# Patient Record
Sex: Female | Born: 1940
Health system: Southern US, Community
[De-identification: ages and names within clinical notes are randomized; demographics above are authoritative.]

## PROBLEM LIST (undated history)

## (undated) DIAGNOSIS — K599 Functional intestinal disorder, unspecified: Secondary | ICD-10-CM

## (undated) DIAGNOSIS — M1990 Unspecified osteoarthritis, unspecified site: Secondary | ICD-10-CM

## (undated) DIAGNOSIS — K589 Irritable bowel syndrome without diarrhea: Secondary | ICD-10-CM

## (undated) DIAGNOSIS — R413 Other amnesia: Secondary | ICD-10-CM

## (undated) DIAGNOSIS — K219 Gastro-esophageal reflux disease without esophagitis: Secondary | ICD-10-CM

## (undated) DIAGNOSIS — R32 Unspecified urinary incontinence: Secondary | ICD-10-CM

## (undated) DIAGNOSIS — M797 Fibromyalgia: Secondary | ICD-10-CM

## (undated) DIAGNOSIS — R142 Eructation: Secondary | ICD-10-CM

## (undated) DIAGNOSIS — C801 Malignant (primary) neoplasm, unspecified: Secondary | ICD-10-CM

## (undated) DIAGNOSIS — E785 Hyperlipidemia, unspecified: Secondary | ICD-10-CM

## (undated) DIAGNOSIS — F419 Anxiety disorder, unspecified: Secondary | ICD-10-CM

## (undated) DIAGNOSIS — R42 Dizziness and giddiness: Secondary | ICD-10-CM

## (undated) DIAGNOSIS — K5792 Diverticulitis of intestine, part unspecified, without perforation or abscess without bleeding: Secondary | ICD-10-CM

## (undated) DIAGNOSIS — K5900 Constipation, unspecified: Secondary | ICD-10-CM

## (undated) DIAGNOSIS — F32A Depression, unspecified: Secondary | ICD-10-CM

## (undated) DIAGNOSIS — F329 Major depressive disorder, single episode, unspecified: Secondary | ICD-10-CM

## (undated) DIAGNOSIS — K648 Other hemorrhoids: Secondary | ICD-10-CM

## (undated) DIAGNOSIS — J189 Pneumonia, unspecified organism: Secondary | ICD-10-CM

## (undated) DIAGNOSIS — N329 Bladder disorder, unspecified: Secondary | ICD-10-CM

## (undated) DIAGNOSIS — N179 Acute kidney failure, unspecified: Secondary | ICD-10-CM

## (undated) DIAGNOSIS — R011 Cardiac murmur, unspecified: Secondary | ICD-10-CM

## (undated) DIAGNOSIS — C3490 Malignant neoplasm of unspecified part of unspecified bronchus or lung: Secondary | ICD-10-CM

## (undated) HISTORY — DX: Unspecified osteoarthritis, unspecified site: M19.90

## (undated) HISTORY — DX: Pneumonia, unspecified organism: J18.9

## (undated) HISTORY — DX: Unspecified urinary incontinence: R32

## (undated) HISTORY — DX: Constipation, unspecified: K59.00

## (undated) HISTORY — DX: Hyperlipidemia, unspecified: E78.5

## (undated) HISTORY — DX: Acute kidney failure, unspecified: N17.9

## (undated) HISTORY — DX: Fibromyalgia: M79.7

## (undated) HISTORY — DX: Anxiety disorder, unspecified: F41.9

## (undated) HISTORY — DX: Cardiac murmur, unspecified: R01.1

## (undated) HISTORY — PX: CHOLECYSTECTOMY: SHX55

## (undated) HISTORY — DX: Gastro-esophageal reflux disease without esophagitis: K21.9

## (undated) HISTORY — DX: Irritable bowel syndrome, unspecified: K58.9

## (undated) HISTORY — DX: Major depressive disorder, single episode, unspecified: F32.9

## (undated) HISTORY — DX: Diverticulitis of intestine, part unspecified, without perforation or abscess without bleeding: K57.92

## (undated) HISTORY — DX: Eructation: R14.2

## (undated) HISTORY — DX: Depression, unspecified: F32.A

## (undated) HISTORY — DX: Dizziness and giddiness: R42

## (undated) HISTORY — PX: BLADDER SUSPENSION: SHX72

## (undated) HISTORY — DX: Other hemorrhoids: K64.8

---
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
## 2011-02-10 LAB — HM COLONOSCOPY

## 2011-06-12 DIAGNOSIS — R05 Cough: Secondary | ICD-10-CM | POA: Diagnosis not present

## 2011-06-12 DIAGNOSIS — H109 Unspecified conjunctivitis: Secondary | ICD-10-CM | POA: Diagnosis not present

## 2011-06-12 DIAGNOSIS — H10239 Serous conjunctivitis, except viral, unspecified eye: Secondary | ICD-10-CM | POA: Diagnosis not present

## 2011-06-17 DIAGNOSIS — J9801 Acute bronchospasm: Secondary | ICD-10-CM | POA: Diagnosis not present

## 2011-06-17 DIAGNOSIS — J019 Acute sinusitis, unspecified: Secondary | ICD-10-CM | POA: Diagnosis not present

## 2011-06-19 DIAGNOSIS — R5381 Other malaise: Secondary | ICD-10-CM | POA: Diagnosis not present

## 2011-06-19 DIAGNOSIS — R509 Fever, unspecified: Secondary | ICD-10-CM | POA: Diagnosis not present

## 2011-06-19 DIAGNOSIS — J1189 Influenza due to unidentified influenza virus with other manifestations: Secondary | ICD-10-CM | POA: Diagnosis not present

## 2011-06-19 DIAGNOSIS — R05 Cough: Secondary | ICD-10-CM | POA: Diagnosis not present

## 2011-06-20 DIAGNOSIS — R748 Abnormal levels of other serum enzymes: Secondary | ICD-10-CM | POA: Diagnosis not present

## 2011-06-20 DIAGNOSIS — K219 Gastro-esophageal reflux disease without esophagitis: Secondary | ICD-10-CM | POA: Diagnosis not present

## 2011-06-20 DIAGNOSIS — F0390 Unspecified dementia without behavioral disturbance: Secondary | ICD-10-CM | POA: Diagnosis not present

## 2011-06-20 DIAGNOSIS — E785 Hyperlipidemia, unspecified: Secondary | ICD-10-CM | POA: Diagnosis not present

## 2011-06-20 DIAGNOSIS — Z882 Allergy status to sulfonamides status: Secondary | ICD-10-CM | POA: Diagnosis not present

## 2011-06-20 DIAGNOSIS — E871 Hypo-osmolality and hyponatremia: Secondary | ICD-10-CM | POA: Diagnosis not present

## 2011-06-20 DIAGNOSIS — R5381 Other malaise: Secondary | ICD-10-CM | POA: Diagnosis not present

## 2011-06-20 DIAGNOSIS — R509 Fever, unspecified: Secondary | ICD-10-CM | POA: Diagnosis not present

## 2011-06-20 DIAGNOSIS — Z9089 Acquired absence of other organs: Secondary | ICD-10-CM | POA: Diagnosis not present

## 2011-06-20 DIAGNOSIS — R109 Unspecified abdominal pain: Secondary | ICD-10-CM | POA: Diagnosis not present

## 2011-06-20 DIAGNOSIS — R5383 Other fatigue: Secondary | ICD-10-CM | POA: Diagnosis not present

## 2011-06-20 DIAGNOSIS — R05 Cough: Secondary | ICD-10-CM | POA: Diagnosis not present

## 2011-06-20 DIAGNOSIS — E559 Vitamin D deficiency, unspecified: Secondary | ICD-10-CM | POA: Diagnosis not present

## 2011-06-20 DIAGNOSIS — B9789 Other viral agents as the cause of diseases classified elsewhere: Secondary | ICD-10-CM | POA: Diagnosis not present

## 2011-06-20 DIAGNOSIS — R7989 Other specified abnormal findings of blood chemistry: Secondary | ICD-10-CM | POA: Diagnosis not present

## 2011-06-20 DIAGNOSIS — J209 Acute bronchitis, unspecified: Secondary | ICD-10-CM | POA: Diagnosis not present

## 2011-06-20 DIAGNOSIS — R160 Hepatomegaly, not elsewhere classified: Secondary | ICD-10-CM | POA: Diagnosis not present

## 2011-06-20 DIAGNOSIS — F329 Major depressive disorder, single episode, unspecified: Secondary | ICD-10-CM | POA: Diagnosis not present

## 2011-06-27 DIAGNOSIS — R7989 Other specified abnormal findings of blood chemistry: Secondary | ICD-10-CM | POA: Diagnosis not present

## 2011-07-10 DIAGNOSIS — N319 Neuromuscular dysfunction of bladder, unspecified: Secondary | ICD-10-CM | POA: Diagnosis not present

## 2011-07-10 DIAGNOSIS — N139 Obstructive and reflux uropathy, unspecified: Secondary | ICD-10-CM | POA: Diagnosis not present

## 2011-07-10 DIAGNOSIS — N3941 Urge incontinence: Secondary | ICD-10-CM | POA: Diagnosis not present

## 2011-07-10 DIAGNOSIS — R350 Frequency of micturition: Secondary | ICD-10-CM | POA: Diagnosis not present

## 2011-07-10 DIAGNOSIS — R3915 Urgency of urination: Secondary | ICD-10-CM | POA: Diagnosis not present

## 2011-07-10 DIAGNOSIS — N3289 Other specified disorders of bladder: Secondary | ICD-10-CM | POA: Diagnosis not present

## 2011-07-15 DIAGNOSIS — J069 Acute upper respiratory infection, unspecified: Secondary | ICD-10-CM | POA: Diagnosis not present

## 2011-07-15 DIAGNOSIS — R945 Abnormal results of liver function studies: Secondary | ICD-10-CM | POA: Diagnosis not present

## 2011-07-15 DIAGNOSIS — E785 Hyperlipidemia, unspecified: Secondary | ICD-10-CM | POA: Diagnosis not present

## 2011-07-15 DIAGNOSIS — J111 Influenza due to unidentified influenza virus with other respiratory manifestations: Secondary | ICD-10-CM | POA: Diagnosis not present

## 2011-07-15 DIAGNOSIS — J9801 Acute bronchospasm: Secondary | ICD-10-CM | POA: Diagnosis not present

## 2011-07-18 DIAGNOSIS — R32 Unspecified urinary incontinence: Secondary | ICD-10-CM | POA: Diagnosis not present

## 2011-07-18 DIAGNOSIS — R350 Frequency of micturition: Secondary | ICD-10-CM | POA: Diagnosis not present

## 2011-07-18 DIAGNOSIS — IMO0002 Reserved for concepts with insufficient information to code with codable children: Secondary | ICD-10-CM | POA: Diagnosis not present

## 2011-07-18 DIAGNOSIS — N3946 Mixed incontinence: Secondary | ICD-10-CM | POA: Diagnosis not present

## 2011-07-23 DIAGNOSIS — N3946 Mixed incontinence: Secondary | ICD-10-CM | POA: Diagnosis not present

## 2011-07-23 DIAGNOSIS — R32 Unspecified urinary incontinence: Secondary | ICD-10-CM | POA: Diagnosis not present

## 2011-07-23 DIAGNOSIS — IMO0002 Reserved for concepts with insufficient information to code with codable children: Secondary | ICD-10-CM | POA: Diagnosis not present

## 2011-07-23 DIAGNOSIS — R350 Frequency of micturition: Secondary | ICD-10-CM | POA: Diagnosis not present

## 2011-08-07 DIAGNOSIS — K219 Gastro-esophageal reflux disease without esophagitis: Secondary | ICD-10-CM | POA: Diagnosis not present

## 2011-08-07 DIAGNOSIS — E785 Hyperlipidemia, unspecified: Secondary | ICD-10-CM | POA: Diagnosis not present

## 2011-08-07 DIAGNOSIS — Z0000 Encounter for general adult medical examination without abnormal findings: Secondary | ICD-10-CM | POA: Diagnosis not present

## 2011-08-07 DIAGNOSIS — N6019 Diffuse cystic mastopathy of unspecified breast: Secondary | ICD-10-CM | POA: Diagnosis not present

## 2011-08-08 DIAGNOSIS — N63 Unspecified lump in unspecified breast: Secondary | ICD-10-CM | POA: Diagnosis not present

## 2011-08-08 DIAGNOSIS — R928 Other abnormal and inconclusive findings on diagnostic imaging of breast: Secondary | ICD-10-CM | POA: Diagnosis not present

## 2011-08-08 DIAGNOSIS — Z1231 Encounter for screening mammogram for malignant neoplasm of breast: Secondary | ICD-10-CM | POA: Diagnosis not present

## 2011-08-08 LAB — HM MAMMOGRAPHY: HM Mammogram: NORMAL

## 2011-08-12 DIAGNOSIS — N6019 Diffuse cystic mastopathy of unspecified breast: Secondary | ICD-10-CM | POA: Diagnosis not present

## 2011-08-12 DIAGNOSIS — N63 Unspecified lump in unspecified breast: Secondary | ICD-10-CM | POA: Diagnosis not present

## 2011-08-13 DIAGNOSIS — IMO0002 Reserved for concepts with insufficient information to code with codable children: Secondary | ICD-10-CM | POA: Diagnosis not present

## 2011-08-13 DIAGNOSIS — N3946 Mixed incontinence: Secondary | ICD-10-CM | POA: Diagnosis not present

## 2011-08-13 DIAGNOSIS — R350 Frequency of micturition: Secondary | ICD-10-CM | POA: Diagnosis not present

## 2011-08-13 DIAGNOSIS — R32 Unspecified urinary incontinence: Secondary | ICD-10-CM | POA: Diagnosis not present

## 2011-08-22 DIAGNOSIS — R350 Frequency of micturition: Secondary | ICD-10-CM | POA: Diagnosis not present

## 2011-08-22 DIAGNOSIS — R32 Unspecified urinary incontinence: Secondary | ICD-10-CM | POA: Diagnosis not present

## 2011-08-22 DIAGNOSIS — N3946 Mixed incontinence: Secondary | ICD-10-CM | POA: Diagnosis not present

## 2011-08-22 DIAGNOSIS — IMO0002 Reserved for concepts with insufficient information to code with codable children: Secondary | ICD-10-CM | POA: Diagnosis not present

## 2011-08-28 DIAGNOSIS — R350 Frequency of micturition: Secondary | ICD-10-CM | POA: Diagnosis not present

## 2011-08-28 DIAGNOSIS — IMO0002 Reserved for concepts with insufficient information to code with codable children: Secondary | ICD-10-CM | POA: Diagnosis not present

## 2011-08-28 DIAGNOSIS — R32 Unspecified urinary incontinence: Secondary | ICD-10-CM | POA: Diagnosis not present

## 2011-08-28 DIAGNOSIS — N3946 Mixed incontinence: Secondary | ICD-10-CM | POA: Diagnosis not present

## 2011-09-10 DIAGNOSIS — N318 Other neuromuscular dysfunction of bladder: Secondary | ICD-10-CM | POA: Diagnosis not present

## 2011-09-10 DIAGNOSIS — F329 Major depressive disorder, single episode, unspecified: Secondary | ICD-10-CM | POA: Diagnosis not present

## 2011-09-10 DIAGNOSIS — R350 Frequency of micturition: Secondary | ICD-10-CM | POA: Diagnosis not present

## 2011-09-16 DIAGNOSIS — R9389 Abnormal findings on diagnostic imaging of other specified body structures: Secondary | ICD-10-CM | POA: Diagnosis not present

## 2011-09-16 DIAGNOSIS — J984 Other disorders of lung: Secondary | ICD-10-CM | POA: Diagnosis not present

## 2011-09-19 DIAGNOSIS — R918 Other nonspecific abnormal finding of lung field: Secondary | ICD-10-CM | POA: Diagnosis not present

## 2011-09-23 DIAGNOSIS — F329 Major depressive disorder, single episode, unspecified: Secondary | ICD-10-CM | POA: Diagnosis not present

## 2011-09-23 DIAGNOSIS — IMO0001 Reserved for inherently not codable concepts without codable children: Secondary | ICD-10-CM | POA: Diagnosis not present

## 2011-09-23 DIAGNOSIS — E785 Hyperlipidemia, unspecified: Secondary | ICD-10-CM | POA: Diagnosis not present

## 2011-09-23 DIAGNOSIS — M1990 Unspecified osteoarthritis, unspecified site: Secondary | ICD-10-CM | POA: Diagnosis not present

## 2011-10-06 DIAGNOSIS — M1710 Unilateral primary osteoarthritis, unspecified knee: Secondary | ICD-10-CM | POA: Diagnosis not present

## 2012-02-03 DIAGNOSIS — H2510 Age-related nuclear cataract, unspecified eye: Secondary | ICD-10-CM | POA: Diagnosis not present

## 2012-02-05 ENCOUNTER — Ambulatory Visit (INDEPENDENT_AMBULATORY_CARE_PROVIDER_SITE_OTHER): Payer: Medicare Other | Admitting: Family Medicine

## 2012-02-05 ENCOUNTER — Encounter: Payer: Self-pay | Admitting: Family Medicine

## 2012-02-05 VITALS — BP 110/78 | HR 72 | Temp 97.8°F | Ht 63.25 in | Wt 178.0 lb

## 2012-02-05 DIAGNOSIS — Z23 Encounter for immunization: Secondary | ICD-10-CM | POA: Diagnosis not present

## 2012-02-05 DIAGNOSIS — R32 Unspecified urinary incontinence: Secondary | ICD-10-CM | POA: Diagnosis not present

## 2012-02-05 DIAGNOSIS — R413 Other amnesia: Secondary | ICD-10-CM

## 2012-02-05 DIAGNOSIS — K5732 Diverticulitis of large intestine without perforation or abscess without bleeding: Secondary | ICD-10-CM

## 2012-02-05 DIAGNOSIS — K5792 Diverticulitis of intestine, part unspecified, without perforation or abscess without bleeding: Secondary | ICD-10-CM

## 2012-02-05 DIAGNOSIS — F331 Major depressive disorder, recurrent, moderate: Secondary | ICD-10-CM | POA: Insufficient documentation

## 2012-02-05 DIAGNOSIS — K219 Gastro-esophageal reflux disease without esophagitis: Secondary | ICD-10-CM

## 2012-02-05 DIAGNOSIS — F329 Major depressive disorder, single episode, unspecified: Secondary | ICD-10-CM

## 2012-02-05 DIAGNOSIS — E785 Hyperlipidemia, unspecified: Secondary | ICD-10-CM | POA: Insufficient documentation

## 2012-02-05 DIAGNOSIS — F32A Depression, unspecified: Secondary | ICD-10-CM

## 2012-02-05 LAB — LIPID PANEL: Cholesterol: 237 mg/dL — ABNORMAL HIGH (ref 0–200)

## 2012-02-05 MED ORDER — MIRABEGRON ER 25 MG PO TB24: 25.0000 mg | ORAL_TABLET | Freq: Every day | ORAL | Status: DC

## 2012-02-05 MED ORDER — CICLOPIROX 1 % EX SHAM: MEDICATED_SHAMPOO | CUTANEOUS | Status: DC

## 2012-02-05 MED ORDER — MEMANTINE HCL 10 MG PO TABS: 10.0000 mg | ORAL_TABLET | Freq: Two times a day (BID) | ORAL | Status: DC

## 2012-02-05 NOTE — Progress Notes (Signed)
Subjective:    Patient ID: Jamie Matthews, female    DOB: 1940-06-11, 71 y.o.   MRN: [id number]  HPI Very pleasant 71 yo female here to establish care.  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 Has been on vesicare since last fall.  Has not noticed much improvement and lately since she moved here, it has gotten worse.  She is working on her Kegel exercises. Does have constipation associated with vesicare use.  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  Per pt, could not tolerate other medications.  Denies dementia.  Will await records.  HLD- stopped taking lipitor which she had been on for years, approx a month ago. She felt the generic was causing itching which has improved since she stopped taking it.  Very active- tries to swim or go to the [facility] daily. Active in the singing group at [facility].  Patient Active Problem List  Diagnosis  . Urinary incontinence  . Depression  . GERD (gastroesophageal reflux disease)  . Diverticulitis  . Hyperlipidemia  . Memory loss   Past Medical History  Diagnosis Date  . Urinary incontinence   . Depression   . GERD (gastroesophageal reflux disease)   . Diverticulitis   . Hyperlipidemia    Past Surgical History  Procedure Date  . Cholecystectomy   . Tonsillectomy   . Bladder suspension 2004, 2012   History  Substance Use Topics  . Smoking status: Never Smoker   . Smokeless tobacco: Not on file  . Alcohol Use: Not on file   Family History  Problem Relation Age of Onset  . Cancer Father   . Diabetes Father    Allergies  Allergen Reactions  . Lipitor (Atorvastatin)     Itching   . Sulfa Antibiotics Rash   Current Outpatient Prescriptions on File Prior to Visit  Medication Sig Dispense Refill  . atorvastatin (LIPITOR) 20 MG tablet Take 20 mg by mouth daily.      . diphenhydrAMINE (BENADRYL) 25 MG tablet Take by mouth as directed      . lansoprazole (PREVACID) 30 MG capsule Take 30 mg by mouth daily.      .  memantine (NAMENDA) 10 MG tablet Take 1 tablet (10 mg total) by mouth 2 (two) times daily.  60 tablet  6  . venlafaxine XR (EFFEXOR-XR) 150 MG 24 hr capsule Take 150 mg by mouth daily.      . mirabegron ER (MYRBETRIQ) 25 MG TB24 Take 1 tablet (25 mg total) by mouth daily.  30 tablet  11   The PMH, PSH, Social History, Family History, Medications, and allergies have been reviewed in [facility], and have been updated if relevant.   Review of Systems See HPI Patient reports no  vision/ hearing changes,anorexia, weight change, fever ,adenopathy, persistant / recurrent hoarseness, swallowing issues, chest pain, edema,persistant / recurrent cough, hemoptysis, major joint swelling, breast masses or abnormal vaginal bleeding.       Objective:   Physical Exam BP 110/78  Pulse 72  Temp 97.8 F (36.6 C)  Ht 5' 3.25" (1.607 m)  Wt 178 lb (80.74 kg)  BMI 31.28 kg/m2  General:  Well-developed,well-nourished,in no acute distress; alert,appropriate and cooperative throughout examination Head:  normocephalic and atraumatic.   Eyes:  vision grossly intact, pupils equal, pupils round, and pupils reactive to light.   Ears:  R ear normal and L ear normal.   Nose:  no external deformity.   Lungs:  Normal respiratory effort, chest expands symmetrically. Lungs  are clear to auscultation, no crackles or wheezes. Heart:  Normal rate and regular rhythm. S1 and S2 normal without gallop, murmur, click, rub or other extra sounds. Abdomen:  Bowel sounds positive,abdomen soft and non-tender without masses, organomegaly or hernias noted. Msk:  No deformity or scoliosis noted of thoracic or lumbar spine.   Extremities:  No clubbing, cyanosis, edema, or deformity noted with normal full range of motion of all joints.   Neurologic:  alert & oriented X3 and gait normal.   Skin:  Intact without suspicious lesions or rashes Psych:  Cognition and judgment appear intact. Alert and cooperative with normal attention span and  concentration. No apparent delusions, illusions, hallucinations        Assessment & Plan:   1. Urinary incontinence  Deteriorated. Will d/c vesicare, refer to urology given h/o bladder sling. Start Mirabegron (no anticholingeric side effects). The patient indicates understanding of these issues and agrees with the plan.   Ambulatory referral to Urology  2. Depression  Stable on Effexor.   3. GERD (gastroesophageal reflux disease Quiet    4. Diverticulitis  Quiet, recent colonoscopy   5. Hyperlipidemia  Check lipid panel today. Lipitor added to allergy list. May not need to restart a statin based on cholesterol. Lipid Panel, Comprehensive metabolic panel  6. Memory loss  Unclear why she is on Namenda as she does not have significant dementia. She was intolerant to Exelon patch. Awaiting records.   7. Need for prophylactic vaccination and inoculation against influenza  Flu vaccine greater than or equal to 3yo preservative free IM

## 2012-02-05 NOTE — Patient Instructions (Addendum)
Please stop your vesicare. We are starting Myrbetriq. We are checking your cholesterol today and will call you with those results.  Please stop by to see Shirlee Limerick on your way out to set up your urology appointment.

## 2012-02-06 LAB — COMPREHENSIVE METABOLIC PANEL
Albumin: 4 g/dL (ref 3.5–5.2)
BUN: 24 mg/dL — ABNORMAL HIGH (ref 6–23)
CO2: 28 mEq/L (ref 19–32)
Calcium: 8.9 mg/dL (ref 8.4–10.5)
Chloride: 104 mEq/L (ref 96–112)
Creatinine, Ser: 1 mg/dL (ref 0.4–1.2)
GFR: 55.76 mL/min — ABNORMAL LOW (ref >60.00)
Glucose, Bld: 83 mg/dL (ref 70–99)
Potassium: 4.5 mEq/L (ref 3.5–5.1)

## 2012-02-06 LAB — LDL CHOLESTEROL, DIRECT: Direct LDL: 175.9 mg/dL

## 2012-02-06 MED ORDER — ATORVASTATIN CALCIUM 20 MG PO TABS: 20.0000 mg | ORAL_TABLET | Freq: Every day | ORAL | Status: DC

## 2012-02-06 NOTE — Addendum Note (Signed)
Addended by: Eliezer Bottom on: 02/06/2012 04:42 PM   Modules accepted: Orders

## 2012-02-09 ENCOUNTER — Telehealth: Payer: Self-pay | Admitting: Family Medicine

## 2012-02-09 NOTE — Telephone Encounter (Signed)
Yes see previous phone note. I advised sending Pravachol 20 mg daily #30 with 1 refill to her pharmacy.  Mirabegron is for urinary incontinence.

## 2012-02-09 NOTE — Telephone Encounter (Signed)
Please call Angelica Chessman to ask her if this is ok to give order for UA.

## 2012-02-09 NOTE — Telephone Encounter (Signed)
Pt called for lab results; see result note; pt said she is allergic to Lipitor; caused itching, no rash or SOB. Should pt restart Lipitor? Pt will not take Lipitor until pt gets call back. Pt also has frequency and urgency of urine; pt wondered if could have urinalysis done at [facility]?(pt resident at [facility]) Pt also wanted to verify why taking Mirabegron;explained for urinary incontinence. Pt wanted that verified by Dr Dayton Martes. [facility], [facility] pharmacy.

## 2012-02-09 NOTE — Telephone Encounter (Signed)
Advised patient, she has not picked up the pravastatin yet, but will.  She is also asking that an order for UA be sent to [facility], since she is having the frequency and urgency.

## 2012-02-09 NOTE — Telephone Encounter (Signed)
Jamie Matthews says it's ok to fax order for UA to fax number [phone].  Order can be faxed on Tuesday morning.

## 2012-02-10 NOTE — Telephone Encounter (Signed)
Order in my box

## 2012-02-10 NOTE — Telephone Encounter (Signed)
Order faxed.

## 2012-02-12 ENCOUNTER — Telehealth: Payer: Self-pay | Admitting: *Deleted

## 2012-02-12 DIAGNOSIS — L820 Inflamed seborrheic keratosis: Secondary | ICD-10-CM | POA: Diagnosis not present

## 2012-02-12 DIAGNOSIS — L219 Seborrheic dermatitis, unspecified: Secondary | ICD-10-CM | POA: Diagnosis not present

## 2012-02-12 DIAGNOSIS — L299 Pruritus, unspecified: Secondary | ICD-10-CM | POA: Diagnosis not present

## 2012-02-12 DIAGNOSIS — D1800 Hemangioma unspecified site: Secondary | ICD-10-CM | POA: Diagnosis not present

## 2012-02-12 DIAGNOSIS — L738 Other specified follicular disorders: Secondary | ICD-10-CM | POA: Diagnosis not present

## 2012-02-12 NOTE — Telephone Encounter (Signed)
Noted! Thank you

## 2012-02-12 NOTE — Telephone Encounter (Signed)
Spoke with patient, she is going to have urine checked at [facility] tomorrow.  Also, wants you to know that she saw dermatologist today and was given an order to have her thyroid checked at [facility] tomorrow.

## 2012-02-13 DIAGNOSIS — R300 Dysuria: Secondary | ICD-10-CM | POA: Diagnosis not present

## 2012-02-17 ENCOUNTER — Encounter: Payer: Self-pay | Admitting: Family Medicine

## 2012-02-19 ENCOUNTER — Encounter: Payer: Self-pay | Admitting: Family Medicine

## 2012-03-02 DIAGNOSIS — R32 Unspecified urinary incontinence: Secondary | ICD-10-CM | POA: Diagnosis not present

## 2012-03-02 DIAGNOSIS — R339 Retention of urine, unspecified: Secondary | ICD-10-CM | POA: Diagnosis not present

## 2012-03-21 DIAGNOSIS — M545 Low back pain: Secondary | ICD-10-CM | POA: Diagnosis not present

## 2012-03-21 DIAGNOSIS — M47817 Spondylosis without myelopathy or radiculopathy, lumbosacral region: Secondary | ICD-10-CM | POA: Diagnosis not present

## 2012-03-21 DIAGNOSIS — M5137 Other intervertebral disc degeneration, lumbosacral region: Secondary | ICD-10-CM | POA: Diagnosis not present

## 2012-03-23 DIAGNOSIS — R32 Unspecified urinary incontinence: Secondary | ICD-10-CM | POA: Diagnosis not present

## 2012-03-23 DIAGNOSIS — R339 Retention of urine, unspecified: Secondary | ICD-10-CM | POA: Diagnosis not present

## 2012-03-30 DIAGNOSIS — R339 Retention of urine, unspecified: Secondary | ICD-10-CM | POA: Diagnosis not present

## 2012-03-30 DIAGNOSIS — N318 Other neuromuscular dysfunction of bladder: Secondary | ICD-10-CM | POA: Diagnosis not present

## 2012-03-30 DIAGNOSIS — N3941 Urge incontinence: Secondary | ICD-10-CM | POA: Diagnosis not present

## 2012-04-01 ENCOUNTER — Ambulatory Visit (INDEPENDENT_AMBULATORY_CARE_PROVIDER_SITE_OTHER): Payer: Medicare Other | Admitting: Family Medicine

## 2012-04-01 ENCOUNTER — Encounter: Payer: Self-pay | Admitting: Family Medicine

## 2012-04-01 VITALS — BP 120/62 | HR 64 | Temp 97.4°F | Wt 174.0 lb

## 2012-04-01 DIAGNOSIS — K219 Gastro-esophageal reflux disease without esophagitis: Secondary | ICD-10-CM | POA: Diagnosis not present

## 2012-04-01 DIAGNOSIS — K5900 Constipation, unspecified: Secondary | ICD-10-CM

## 2012-04-01 DIAGNOSIS — K5909 Other constipation: Secondary | ICD-10-CM | POA: Insufficient documentation

## 2012-04-01 MED ORDER — LUBIPROSTONE 8 MCG PO CAPS: 8.0000 ug | ORAL_CAPSULE | Freq: Two times a day (BID) | ORAL | Status: DC

## 2012-04-01 NOTE — Patient Instructions (Addendum)
Let's start Align 4 mg daily- this is a probiotic.  We are also adding amitiza.  Please call me next week with an update.    Gastroesophageal Reflux Disease, Adult Gastroesophageal reflux disease (GERD) happens when acid from your stomach flows up into the esophagus. When acid comes in contact with the esophagus, the acid causes soreness (inflammation) in the esophagus. Over time, GERD may create small holes (ulcers) in the lining of the esophagus. CAUSES   Increased body weight. This puts pressure on the stomach, making acid rise from the stomach into the esophagus.  Smoking. This increases acid production in the stomach.  Drinking alcohol. This causes decreased pressure in the lower esophageal sphincter (valve or ring of muscle between the esophagus and stomach), allowing acid from the stomach into the esophagus.  Late evening meals and a full stomach. This increases pressure and acid production in the stomach.  A malformed lower esophageal sphincter. Sometimes, no cause is found. SYMPTOMS   Burning pain in the lower part of the mid-chest behind the breastbone and in the mid-stomach area. This may occur twice a week or more often.  Trouble swallowing.  Sore throat.  Dry cough.  Asthma-like symptoms including chest tightness, shortness of breath, or wheezing. DIAGNOSIS  Your caregiver may be able to diagnose GERD based on your symptoms. In some cases, X-rays and other tests may be done to check for complications or to check the condition of your stomach and esophagus. TREATMENT  Your caregiver may recommend over-the-counter or prescription medicines to help decrease acid production. Ask your caregiver before starting or adding any new medicines.  HOME CARE INSTRUCTIONS   Change the factors that you can control. Ask your caregiver for guidance concerning weight loss, quitting smoking, and alcohol consumption.  Avoid foods and drinks that make your symptoms worse, such  as:  Caffeine or alcoholic drinks.  Chocolate.  Peppermint or mint flavorings.  Garlic and onions.  Spicy foods.  Citrus fruits, such as oranges, lemons, or limes.  Tomato-based foods such as sauce, chili, salsa, and pizza.  Fried and fatty foods.  Avoid lying down for the 3 hours prior to your bedtime or prior to taking a nap.  Eat small, frequent meals instead of large meals.  Wear loose-fitting clothing. Do not wear anything tight around your waist that causes pressure on your stomach.  Raise the head of your bed 6 to 8 inches with wood blocks to help you sleep. Extra pillows will not help.  Only take over-the-counter or prescription medicines for pain, discomfort, or fever as directed by your caregiver.  Do not take aspirin, ibuprofen, or other nonsteroidal anti-inflammatory drugs (NSAIDs). SEEK IMMEDIATE MEDICAL CARE IF:   You have pain in your arms, neck, jaw, teeth, or back.  Your pain increases or changes in intensity or duration.  You develop nausea, vomiting, or sweating (diaphoresis).  You develop shortness of breath, or you faint.  Your vomit is green, yellow, black, or looks like coffee grounds or blood.  Your stool is red, bloody, or black. These symptoms could be signs of other problems, such as heart disease, gastric bleeding, or esophageal bleeding. MAKE SURE YOU:   Understand these instructions.  Will watch your condition.  Will get help right away if you are not doing well or get worse. Document Released: 02/19/2005 Document Revised: 08/04/2011 Document Reviewed: 11/29/2010 [facility] Patient Information 2013 [place], [place].

## 2012-04-01 NOTE — Progress Notes (Signed)
Subjective:    Patient ID: Jamie Matthews, female    DOB: 1941-03-04, 71 y.o.   MRN: [id number]  HPI Very pleasant 71 yo female with h/o GERD and chronic constipation here for:  1.  Worsening constipation- using miralax daily but still having constipation.  Colonoscopy last year neg (except for diverticula). She is also very gasy.  Not taking any stool softeners.  No abdominal pain.  Sometimes nauseated, no vomiting.  On Myrbetriq for UI, previously on Vesicare which we d/c'd due to constipation.   2.  GERD- worsening symptoms of acid in her chest and throat at night.  Started taking Tagamet in the evening and those symptoms have improved.     Patient Active Problem List  Diagnosis  . Urinary incontinence  . Depression  . GERD (gastroesophageal reflux disease)  . Diverticulitis  . Hyperlipidemia  . Memory loss  . Constipation   Past Medical History  Diagnosis Date  . Urinary incontinence   . Depression   . GERD (gastroesophageal reflux disease)   . Diverticulitis   . Hyperlipidemia    Past Surgical History  Procedure Date  . Cholecystectomy   . Tonsillectomy   . Bladder suspension 2004, 2012   History  Substance Use Topics  . Smoking status: Never Smoker   . Smokeless tobacco: Not on file  . Alcohol Use: Not on file   Family History  Problem Relation Age of Onset  . Cancer Father   . Diabetes Father    Allergies  Allergen Reactions  . Lipitor (Atorvastatin)     Itching   . Sulfa Antibiotics Rash   Current Outpatient Prescriptions on File Prior to Visit  Medication Sig Dispense Refill  . atorvastatin (LIPITOR) 20 MG tablet Take 1 tablet (20 mg total) by mouth daily.  30 tablet  5  . Ciclopirox 1 % shampoo Use as directed.  120 mL  0  . diphenhydrAMINE (BENADRYL) 25 MG tablet Take by mouth as directed      . lansoprazole (PREVACID) 30 MG capsule Take 30 mg by mouth daily.      . memantine (NAMENDA) 10 MG tablet Take 1 tablet (10 mg total) by mouth 2  (two) times daily.  60 tablet  6  . mirabegron ER (MYRBETRIQ) 25 MG TB24 Take 1 tablet (25 mg total) by mouth daily.  30 tablet  11  . venlafaxine XR (EFFEXOR-XR) 150 MG 24 hr capsule Take 150 mg by mouth daily.       The PMH, PSH, Social History, Family History, Medications, and allergies have been reviewed in [facility], and have been updated if relevant.   Review of Systems See HPI    Appetite good- she is actively trying to lose weight. Objective:   Physical Exam BP 120/62  Pulse 64  Temp 97.4 F (36.3 C)  Wt 174 lb (78.926 kg)  General:  Well-developed,well-nourished,in no acute distress; alert,appropriate and cooperative throughout examination Head:  normocephalic and atraumatic.   Lungs:  Normal respiratory effort, chest expands symmetrically. Lungs are clear to auscultation, no crackles or wheezes. Heart:  Normal rate and regular rhythm. S1 and S2 normal without gallop, murmur, click, rub or other extra sounds. Abdomen:  Bowel sounds positive,abdomen soft and non-tender without masses, organomegaly or hernias noted. Msk:  No deformity or scoliosis noted of thoracic or lumbar spine.   Extremities:  No clubbing, cyanosis, edema, or deformity noted with normal full range of motion of all joints.   Neurologic:  alert & oriented  X3 and gait normal.   Skin:  Intact without suspicious lesions or rashes Psych:  Cognition and judgment appear intact. Alert and cooperative with normal attention span and concentration. No apparent delusions, illusions, hallucinations     Assessment & Plan:    1. GERD (gastroesophageal reflux disease)  Deteriorated.  Discussed supportive care- see handout.   Continue Tagamet in the evening.  2. Constipation  ? IBS as contributing factor- given samples of amitiza.  Add Align as well. Pt to call me in a few weeks with an update.

## 2012-04-05 ENCOUNTER — Other Ambulatory Visit: Payer: Self-pay | Admitting: *Deleted

## 2012-04-05 MED ORDER — LANSOPRAZOLE 30 MG PO CPDR: 30.0000 mg | DELAYED_RELEASE_CAPSULE | Freq: Every day | ORAL | Status: DC

## 2012-04-08 ENCOUNTER — Other Ambulatory Visit: Payer: Self-pay | Admitting: *Deleted

## 2012-04-08 MED ORDER — ALBUTEROL SULFATE HFA 108 (90 BASE) MCG/ACT IN AERS: 2.0000 | INHALATION_SPRAY | Freq: Four times a day (QID) | RESPIRATORY_TRACT | Status: DC | PRN

## 2012-04-08 NOTE — Telephone Encounter (Signed)
Pt is asking for script for albuterol inhaler.  Says her previous doctor "at home" prescribed this for her and she only uses occasionally.  This isn't on med list.

## 2012-04-08 NOTE — Telephone Encounter (Signed)
Rx sent to her pharmacy 

## 2012-04-09 ENCOUNTER — Telehealth: Payer: Self-pay

## 2012-04-09 NOTE — Telephone Encounter (Signed)
Pt started Amitiza and doing OK, no problem with constipation now. Pt thought Align was to be called in, explained is OTC med and pt will get Align as well.[facility] if needed.

## 2012-04-09 NOTE — Telephone Encounter (Signed)
Noted! Thank you

## 2012-04-21 ENCOUNTER — Other Ambulatory Visit: Payer: Self-pay

## 2012-04-21 MED ORDER — VENLAFAXINE HCL ER 150 MG PO CP24: 150.0000 mg | ORAL_CAPSULE | Freq: Every day | ORAL | Status: DC

## 2012-04-21 NOTE — Telephone Encounter (Signed)
[facility] faxed refill for venlafaxine XR # 90 x 0.

## 2012-04-26 DIAGNOSIS — M2040 Other hammer toe(s) (acquired), unspecified foot: Secondary | ICD-10-CM | POA: Diagnosis not present

## 2012-04-26 DIAGNOSIS — Q6689 Other  specified congenital deformities of feet: Secondary | ICD-10-CM | POA: Diagnosis not present

## 2012-04-26 DIAGNOSIS — M659 Synovitis and tenosynovitis, unspecified: Secondary | ICD-10-CM | POA: Diagnosis not present

## 2012-04-26 DIAGNOSIS — M2010 Hallux valgus (acquired), unspecified foot: Secondary | ICD-10-CM | POA: Diagnosis not present

## 2012-04-30 ENCOUNTER — Other Ambulatory Visit: Payer: Self-pay | Admitting: Family Medicine

## 2012-07-21 ENCOUNTER — Other Ambulatory Visit: Payer: Self-pay | Admitting: Family Medicine

## 2012-08-03 ENCOUNTER — Encounter: Payer: Self-pay | Admitting: Family Medicine

## 2012-08-03 ENCOUNTER — Ambulatory Visit (INDEPENDENT_AMBULATORY_CARE_PROVIDER_SITE_OTHER): Payer: Medicare Other | Admitting: Family Medicine

## 2012-08-03 VITALS — BP 130/70 | HR 72 | Temp 98.2°F | Wt 177.0 lb

## 2012-08-03 DIAGNOSIS — B9789 Other viral agents as the cause of diseases classified elsewhere: Secondary | ICD-10-CM | POA: Diagnosis not present

## 2012-08-03 NOTE — Patient Instructions (Addendum)
Good to see you. This is likely a bad virus. Drink lots of fluids.   Treat sympotmatically with Mucinex, nasal saline irrigation, and Tylenol/Ibuprofen.  Also try an antihistamine/decongestant like claritin D or zyrtec D over the counter- two times a day as needed ( have to sign for them at pharmacy).   Try over the counter Nasocort-start with 2 sprays per nostril per day...and then try to taper to 1 spray per nostril once symptoms improve.   You can use warm compresses.    Call if not improving as expected in 5-7 days.

## 2012-08-03 NOTE — Progress Notes (Signed)
SUBJECTIVE:  Jamie Matthews is a 72 y.o. female who complains of coryza, congestion, sneezing and productive cough for 3 days. She denies a history of anorexia, chest pain, chills, dizziness and fevers and denies a history of asthma. Patient denies smoke cigarettes.   Patient Active Problem List  Diagnosis  . Urinary incontinence  . Depression  . GERD (gastroesophageal reflux disease)  . Diverticulitis  . Hyperlipidemia  . Memory loss  . Constipation   Past Medical History  Diagnosis Date  . Urinary incontinence   . Depression   . GERD (gastroesophageal reflux disease)   . Diverticulitis   . Hyperlipidemia    Past Surgical History  Procedure Laterality Date  . Cholecystectomy    . Tonsillectomy    . Bladder suspension  2004, 2012   History  Substance Use Topics  . Smoking status: Never Smoker   . Smokeless tobacco: Not on file  . Alcohol Use: Not on file   Family History  Problem Relation Age of Onset  . Cancer Father   . Diabetes Father    Allergies  Allergen Reactions  . Lipitor (Atorvastatin)     Itching   . Sulfa Antibiotics Rash   Current Outpatient Prescriptions on File Prior to Visit  Medication Sig Dispense Refill  . albuterol (PROVENTIL HFA;VENTOLIN HFA) 108 (90 BASE) MCG/ACT inhaler Inhale 2 puffs into the lungs every 6 (six) hours as needed for wheezing.  1 Inhaler  0  . atorvastatin (LIPITOR) 20 MG tablet Take 1 tablet (20 mg total) by mouth daily.  30 tablet  5  . Ciclopirox 1 % shampoo USE AS DIRECTED  120 mL  0  . diphenhydrAMINE (BENADRYL) 25 MG tablet Take by mouth as directed      . lansoprazole (PREVACID) 30 MG capsule Take 1 capsule (30 mg total) by mouth daily.  90 capsule  1  . lubiprostone (AMITIZA) 8 MCG capsule Take 1 capsule (8 mcg total) by mouth 2 (two) times daily with a meal.  60 capsule  0  . memantine (NAMENDA) 10 MG tablet Take 1 tablet (10 mg total) by mouth 2 (two) times daily.  60 tablet  6  . mirabegron ER (MYRBETRIQ) 25  MG TB24 Take 1 tablet (25 mg total) by mouth daily.  30 tablet  11  . venlafaxine XR (EFFEXOR-XR) 150 MG 24 hr capsule TAKE ONE CAPSULE DAILY  30 capsule  2   No current facility-administered medications on file prior to visit.   The PMH, PSH, Social History, Family History, Medications, and allergies have been reviewed in [facility], and have been updated if relevant.  OBJECTIVE: BP 130/70  Pulse 72  Temp(Src) 98.2 F (36.8 C)  Wt 177 lb (80.287 kg)  BMI 31.09 kg/m2  She appears well, vital signs are as noted. Ears normal.  Throat and pharynx normal.  Neck supple. No adenopathy in the neck. Nose is congested. Sinuses non tender. The chest is clear, without wheezes or rales.  ASSESSMENT:  viral upper respiratory illness  PLAN: Symptomatic therapy suggested: push fluids, rest and return office visit prn if symptoms persist or worsen. Lack of antibiotic effectiveness discussed with her. Call or return to clinic prn if these symptoms worsen or fail to improve as anticipated.

## 2012-09-16 ENCOUNTER — Other Ambulatory Visit: Payer: Self-pay | Admitting: Family Medicine

## 2012-09-16 MED ORDER — ATORVASTATIN CALCIUM 20 MG PO TABS: 20.0000 mg | ORAL_TABLET | Freq: Every day | ORAL | Status: DC

## 2012-09-16 NOTE — Telephone Encounter (Signed)
Received fax from pharmacy saying pt would like Rx changed to a 90 day supply. Done

## 2012-09-27 ENCOUNTER — Other Ambulatory Visit: Payer: Self-pay | Admitting: Family Medicine

## 2012-10-02 ENCOUNTER — Emergency Department: Payer: Self-pay | Admitting: Emergency Medicine

## 2012-10-02 DIAGNOSIS — S61209A Unspecified open wound of unspecified finger without damage to nail, initial encounter: Secondary | ICD-10-CM | POA: Diagnosis not present

## 2012-10-02 DIAGNOSIS — IMO0001 Reserved for inherently not codable concepts without codable children: Secondary | ICD-10-CM | POA: Diagnosis not present

## 2012-10-02 DIAGNOSIS — Z882 Allergy status to sulfonamides status: Secondary | ICD-10-CM | POA: Diagnosis not present

## 2012-10-14 ENCOUNTER — Encounter: Payer: Self-pay | Admitting: Family Medicine

## 2012-10-14 ENCOUNTER — Ambulatory Visit: Payer: Medicare Other | Admitting: Family Medicine

## 2012-10-14 ENCOUNTER — Ambulatory Visit (INDEPENDENT_AMBULATORY_CARE_PROVIDER_SITE_OTHER): Payer: Medicare Other | Admitting: Family Medicine

## 2012-10-14 VITALS — BP 100/60 | HR 64 | Temp 98.1°F | Wt 168.0 lb

## 2012-10-14 DIAGNOSIS — K5900 Constipation, unspecified: Secondary | ICD-10-CM

## 2012-10-14 DIAGNOSIS — K219 Gastro-esophageal reflux disease without esophagitis: Secondary | ICD-10-CM | POA: Diagnosis not present

## 2012-10-14 DIAGNOSIS — K5792 Diverticulitis of intestine, part unspecified, without perforation or abscess without bleeding: Secondary | ICD-10-CM

## 2012-10-14 DIAGNOSIS — K5732 Diverticulitis of large intestine without perforation or abscess without bleeding: Secondary | ICD-10-CM

## 2012-10-14 LAB — CBC WITH DIFFERENTIAL/PLATELET
Basophils Absolute: 0 10*3/uL (ref 0.0–0.1)
Lymphocytes Relative: 18.2 % (ref 12.0–46.0)
Lymphs Abs: 1.2 10*3/uL (ref 0.7–4.0)
Monocytes Relative: 12.6 % — ABNORMAL HIGH (ref 3.0–12.0)
Neutrophils Relative %: 66.2 % (ref 43.0–77.0)
Platelets: 232 10*3/uL (ref 150.0–400.0)
RDW: 13.9 % (ref 11.5–14.6)
WBC: 6.9 10*3/uL (ref 4.5–10.5)

## 2012-10-14 NOTE — Patient Instructions (Addendum)
Good to see you. We will call you with your lab results.  We will also call you about your endoscopy/GI referral.

## 2012-10-14 NOTE — Progress Notes (Signed)
Subjective:    Patient ID: Jamie Matthews, female    DOB: 23-Jul-1940, 72 y.o.   MRN: [id number]  HPI Very pleasant 72 yo female with h/o GERD and chronic constipation here for:  Worsening indigestion and decreased appetite x 3-5 days. She has lost 9 pounds since 07/2012 but this has been intentional- going to weight watchers.  Wt Readings from Last 3 Encounters:  10/14/12 168 lb (76.204 kg)  08/03/12 177 lb (80.287 kg)  04/01/12 174 lb (78.926 kg)   Takes prevacid nightly but waking up with gnawing pain, more fatigue.  Chronic constipation.  No blood in stool.  Intermittent nausea with no vomiting. At times, she has felt like food and liquid get "caught."  Last endoscopy 2 years ago.  Patient Active Problem List   Diagnosis Date Noted  . Constipation 04/01/2012  . 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 Urinary incontinence   . Depression   . GERD (gastroesophageal reflux disease)   . Diverticulitis   . Hyperlipidemia    Past Medical History  Diagnosis Date  . Urinary incontinence   . Depression   . GERD (gastroesophageal reflux disease)   . Diverticulitis   . Hyperlipidemia    Past Surgical History  Procedure Laterality Date  . Cholecystectomy    . Tonsillectomy    . Bladder suspension  2004, 2012   History  Substance Use Topics  . Smoking status: Never Smoker   . Smokeless tobacco: Not on file  . Alcohol Use: Not on file   Family History  Problem Relation Age of Onset  . Cancer Father   . Diabetes Father    Allergies  Allergen Reactions  . Lipitor (Atorvastatin)     Itching   . Sulfa Antibiotics Rash   Current Outpatient Prescriptions on File Prior to Visit  Medication Sig Dispense Refill  . albuterol (PROVENTIL HFA;VENTOLIN HFA) 108 (90 BASE) MCG/ACT inhaler Inhale 2 puffs into the lungs every 6 (six) hours as needed for wheezing.  1 Inhaler  0  . atorvastatin (LIPITOR) 20 MG tablet Take 1 tablet (20 mg total) by mouth daily.  90 tablet  0  . Ciclopirox 1 %  shampoo USE AS DIRECTED  120 mL  0  . diphenhydrAMINE (BENADRYL) 25 MG tablet Take by mouth as directed      . lansoprazole (PREVACID) 30 MG capsule TAKE ONE CAPSULE DAILY  90 capsule  1  . lubiprostone (AMITIZA) 8 MCG capsule Take 1 capsule (8 mcg total) by mouth 2 (two) times daily with a meal.  60 capsule  0  . mirabegron ER (MYRBETRIQ) 25 MG TB24 Take 1 tablet (25 mg total) by mouth daily.  30 tablet  11  . NAMENDA 10 MG tablet TAKE ONE TABLET TWICE A DAY  60 tablet  5  . venlafaxine XR (EFFEXOR-XR) 150 MG 24 hr capsule TAKE ONE CAPSULE DAILY  30 capsule  2   No current facility-administered medications on file prior to visit.   The PMH, PSH, Social History, Family History, Medications, and allergies have been reviewed in [facility], and have been updated if relevant.   Review of Systems See HPI    Appetite good- she is actively trying to lose weight. Objective:   Physical Exam BP 100/60  Pulse 64  Temp(Src) 98.1 F (36.7 C)  Wt 168 lb (76.204 kg)  BMI 29.51 kg/m2  General:  Well-developed,well-nourished,in no acute distress; alert,appropriate and cooperative throughout examination Head:  normocephalic and atraumatic.   Lungs:  Normal respiratory  effort, chest expands symmetrically. Lungs are clear to auscultation, no crackles or wheezes. Heart:  Normal rate and regular rhythm. S1 and S2 normal without gallop, murmur, click, rub or other extra sounds. Abdomen:  Bowel sounds positive,abdomen soft and non-tender without masses, organomegaly or hernias noted. Msk:  No deformity or scoliosis noted of thoracic or lumbar spine.   Extremities:  No clubbing, cyanosis, edema, or deformity noted with normal full range of motion of all joints.   Neurologic:  alert & oriented X3 and gait normal.   Skin:  Intact without suspicious lesions or rashes Psych:  Cognition and judgment appear intact. Alert and cooperative with normal attention span and concentration. No apparent delusions, illusions,  hallucinations     Assessment & Plan:   1. GERD (gastroesophageal reflux disease) Deteriorated.  With fatigue and nausea- check H.Pylori, CBC. Refer to GI for repeat endoscopy.  If H pylori neg, will increase PPI. The patient indicates understanding of these issues and agrees with the plan.  - H. pylori Screen - CBC with Differential - Ambulatory referral to Gastroenterology

## 2012-10-19 ENCOUNTER — Encounter: Payer: Self-pay | Admitting: Gastroenterology

## 2012-10-19 ENCOUNTER — Ambulatory Visit: Payer: Medicare Other

## 2012-10-19 DIAGNOSIS — K219 Gastro-esophageal reflux disease without esophagitis: Secondary | ICD-10-CM

## 2012-10-19 LAB — H. PYLORI ANTIBODY, IGG: H Pylori IgG: NEGATIVE

## 2012-11-10 ENCOUNTER — Ambulatory Visit (INDEPENDENT_AMBULATORY_CARE_PROVIDER_SITE_OTHER): Payer: Medicare Other | Admitting: Gastroenterology

## 2012-11-10 ENCOUNTER — Encounter: Payer: Self-pay | Admitting: Gastroenterology

## 2012-11-10 ENCOUNTER — Other Ambulatory Visit: Payer: Self-pay | Admitting: *Deleted

## 2012-11-10 VITALS — BP 120/60 | HR 72 | Ht 64.5 in | Wt 168.5 lb

## 2012-11-10 DIAGNOSIS — K219 Gastro-esophageal reflux disease without esophagitis: Secondary | ICD-10-CM

## 2012-11-10 DIAGNOSIS — K5900 Constipation, unspecified: Secondary | ICD-10-CM

## 2012-11-10 DIAGNOSIS — R1310 Dysphagia, unspecified: Secondary | ICD-10-CM

## 2012-11-10 MED ORDER — VENLAFAXINE HCL ER 150 MG PO CP24: ORAL_CAPSULE | ORAL | Status: DC

## 2012-11-10 MED ORDER — LINACLOTIDE 145 MCG PO CAPS: 145.0000 ug | ORAL_CAPSULE | Freq: Every day | ORAL | Status: DC

## 2012-11-10 NOTE — Assessment & Plan Note (Signed)
Symptoms well-controlled with Prevacid. Plan to continue with the same.

## 2012-11-10 NOTE — Assessment & Plan Note (Addendum)
Rule out early esophageal stricture  Recommendations #1 upper endoscopy with dilatation as indicated  Should she continue to have choking episodes in the face of dilatation therapy I would consider a modified barium swallowing study

## 2012-11-10 NOTE — Progress Notes (Signed)
History of Present Illness: Pleasant 72 year old white female referred at the request of Dr. Dayton Martes for evaluation of dysphagia and constipation. She's having mild dysphagia to solids. In addition, she intermittently gets a feeling of choking or develops coughing immediately after swallowing. She has pyrosis that is well-controlled when she takes Prevacid. Weight is stable.  Patient also complains of constipation. She may have a spontaneous bowel movement every 3 days and feels rather uncomfortable in between. She had a colonoscopy in [place] within the past 2 years that apparently was normal. She denies rectal bleeding.    Past Medical History  Diagnosis Date  . Urinary incontinence   . Depression   . GERD (gastroesophageal reflux disease)   . Diverticulitis   . Hyperlipidemia   . Fibromyalgia    Past Surgical History  Procedure Laterality Date  . Cholecystectomy    . Tonsillectomy    . Bladder suspension  2004, 2012   family history includes Cancer in her father; Diabetes in her father; and Heart disease in her father and mother. Current Outpatient Prescriptions  Medication Sig Dispense Refill  . albuterol (PROVENTIL HFA;VENTOLIN HFA) 108 (90 BASE) MCG/ACT inhaler Inhale 2 puffs into the lungs every 6 (six) hours as needed for wheezing.  1 Inhaler  0  . atorvastatin (LIPITOR) 20 MG tablet Take 1 tablet (20 mg total) by mouth daily.  90 tablet  0  . BIOTIN 5000 PO Take by mouth.      . Ciclopirox 1 % shampoo USE AS DIRECTED  120 mL  0  . diphenhydrAMINE (BENADRYL) 25 MG tablet Take by mouth as directed      . Ibuprofen 200 MG CAPS Take 200 mg by mouth daily.      . lansoprazole (PREVACID) 30 MG capsule TAKE ONE CAPSULE DAILY  90 capsule  1  . MAGNESIUM PO Take by mouth.      . mirabegron ER (MYRBETRIQ) 25 MG TB24 Take 1 tablet (25 mg total) by mouth daily.  30 tablet  11  . Multiple Vitamins-Minerals (CENTRUM PO) Take one by mouth daily      . NAMENDA 10 MG tablet TAKE ONE TABLET  TWICE A DAY  60 tablet  5  . polyethylene glycol powder (MIRALAX) powder Take 17 g by mouth daily.      . ranitidine (ZANTAC) 150 MG tablet Take 150 mg by mouth 2 (two) times daily.      [name] triamcinolone (NASACORT) 55 MCG/ACT nasal inhaler Place 2 sprays into the nose daily.      [name] venlafaxine XR (EFFEXOR-XR) 150 MG 24 hr capsule TAKE ONE CAPSULE DAILY  30 capsule  2   No current facility-administered medications for this visit.   Allergies as of 11/10/2012 - Review Complete 11/10/2012  Allergen Reaction Noted  . Lipitor (atorvastatin)  02/05/2012  . Sulfa antibiotics Rash 02/05/2012    reports that she has never smoked. She has never used smokeless tobacco. She reports that she does not drink alcohol or use illicit drugs.     Review of Systems: Pertinent positive and negative review of systems were noted in the above HPI section. All other review of systems were otherwise negative.  Vital signs were reviewed in today's medical record Physical Exam: General: Well developed , well nourished, no acute distress Skin: anicteric Head: Normocephalic and atraumatic Eyes:  sclerae anicteric, EOMI Ears: Normal auditory acuity Mouth: No deformity or lesions Neck: Supple, no masses or thyromegaly Lungs: Clear throughout to auscultation Heart: Regular rate and  rhythm; no murmurs, rubs or bruits Abdomen: Soft, non tender and non distended. No masses, hepatosplenomegaly or hernias noted. Normal Bowel sounds Rectal:deferred Musculoskeletal: Symmetrical with no gross deformities  Skin: No lesions on visible extremities Pulses:  Normal pulses noted Extremities: No clubbing, cyanosis, edema or deformities noted Neurological: Alert oriented x 4, grossly nonfocal Cervical Nodes:  No significant cervical adenopathy Inguinal Nodes: No significant inguinal adenopathy Psychological:  Alert and cooperative. Normal mood and affect

## 2012-11-10 NOTE — Assessment & Plan Note (Signed)
Recent colonoscopy negative. Symptoms compatible with chronic idiopathic constipation.  Recommendations #1 trial of Linzess 145ug qd

## 2012-11-10 NOTE — Patient Instructions (Signed)
You have been scheduled for an endoscopy with propofol. Please follow written instructions given to you at your visit today. If you use inhalers (even only as needed), please bring them with you on the day of your procedure. Your physician has requested that you go to www.startemmi.com and enter the access code given to you at your visit today. This web site gives a general overview about your procedure. However, you should still follow specific instructions given to you by our office regarding your preparation for the procedure.  Linzess samples given

## 2012-11-15 ENCOUNTER — Other Ambulatory Visit: Payer: Self-pay | Admitting: Family Medicine

## 2012-11-15 ENCOUNTER — Ambulatory Visit (INDEPENDENT_AMBULATORY_CARE_PROVIDER_SITE_OTHER): Payer: Medicare Other | Admitting: Family Medicine

## 2012-11-15 ENCOUNTER — Encounter: Payer: Self-pay | Admitting: Family Medicine

## 2012-11-15 VITALS — BP 118/76 | HR 60 | Temp 98.0°F | Ht 63.75 in | Wt 167.0 lb

## 2012-11-15 DIAGNOSIS — E785 Hyperlipidemia, unspecified: Secondary | ICD-10-CM

## 2012-11-15 DIAGNOSIS — R1310 Dysphagia, unspecified: Secondary | ICD-10-CM

## 2012-11-15 DIAGNOSIS — Z66 Do not resuscitate: Secondary | ICD-10-CM

## 2012-11-15 DIAGNOSIS — Z0000 Encounter for general adult medical examination without abnormal findings: Secondary | ICD-10-CM

## 2012-11-15 DIAGNOSIS — F3289 Other specified depressive episodes: Secondary | ICD-10-CM

## 2012-11-15 DIAGNOSIS — Z136 Encounter for screening for cardiovascular disorders: Secondary | ICD-10-CM

## 2012-11-15 DIAGNOSIS — K5900 Constipation, unspecified: Secondary | ICD-10-CM

## 2012-11-15 DIAGNOSIS — Z1231 Encounter for screening mammogram for malignant neoplasm of breast: Secondary | ICD-10-CM

## 2012-11-15 DIAGNOSIS — F329 Major depressive disorder, single episode, unspecified: Secondary | ICD-10-CM

## 2012-11-15 LAB — COMPREHENSIVE METABOLIC PANEL
AST: 19 U/L (ref 0–37)
Albumin: 4 g/dL (ref 3.5–5.2)
BUN: 17 mg/dL (ref 6–23)
Calcium: 8.9 mg/dL (ref 8.4–10.5)
Chloride: 105 mEq/L (ref 96–112)
Glucose, Bld: 107 mg/dL — ABNORMAL HIGH (ref 70–99)
Potassium: 4.3 mEq/L (ref 3.5–5.1)
Sodium: 142 mEq/L (ref 135–145)
Total Protein: 7.1 g/dL (ref 6.0–8.3)

## 2012-11-15 LAB — LIPID PANEL
Cholesterol: 136 mg/dL (ref 0–200)
Total CHOL/HDL Ratio: 3
Triglycerides: 63 mg/dL (ref 0.0–149.0)

## 2012-11-15 MED ORDER — MIRABEGRON ER 50 MG PO TB24: 50.0000 mg | ORAL_TABLET | Freq: Every day | ORAL | Status: DC

## 2012-11-15 NOTE — Patient Instructions (Addendum)
Good to see you. We are doubling your dose of Myrbetriq- ok to take two of your 25 mg tablets until you run out.  We will call you with your lab results.  Please stop by to see Shirlee Limerick on your way out to set up your mammogram.

## 2012-11-15 NOTE — Addendum Note (Signed)
Addended by: Josph Macho A on: 11/15/2012 10:07 AM   Modules accepted: Orders

## 2012-11-15 NOTE — Progress Notes (Signed)
Subjective:    Patient ID: Jamie Matthews, female    DOB: 1941/04/25, 72 y.o.   MRN: [id number]  HPI Very pleasant 72 yo female here for medicare annual wellness visit.  I have personally reviewed the Medicare Annual Wellness questionnaire and have noted 1. The patient's medical and social history 2. Their use of alcohol, tobacco or illicit drugs 3. Their current medications and supplements 4. The patient's functional ability including ADL's, fall risks, home safety risks and hearing or visual             impairment. 5. Diet and physical activities 6. Evidence for depression or mood disorders  End of life wishes discussed and updated in Social History.  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 Had been on vesicare.  We stopped this and started Myrbetriq in 01/2012 given worsening constipation.  Initially saw some symptom improvement but this has deteriorated recently. She also saw Dr. Ronal Fear for urodynamics in 03/2012 (note reviewed).  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  Per pt, could not tolerate other medications.    HLD- stopped taking lipitor which she had been on for years, approx a month ago. She felt the generic was causing itching which has improved since she stopped taking it.  Very active- tries to swim or go to the [facility] daily. Active in the singing group at [facility].  Dysphagia- saw Dr. Arlyce Dice last week.  Endoscopy scheduled to rule out stricture.  Also started on Linzess for chronic constipation (amitiza d/c'd).  Patient Active Problem List   Diagnosis Date Noted  . Routine general medical examination at a health care facility 11/15/2012  . Dysphagia, unspecified(787.20) 11/10/2012  . Constipation 04/01/2012  . 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 Urinary incontinence   . Depression   . GERD (gastroesophageal reflux disease)   . Diverticulitis   . Hyperlipidemia    Past Medical History  Diagnosis Date  . Urinary incontinence   . Depression   . GERD  (gastroesophageal reflux disease)   . Diverticulitis   . Hyperlipidemia   . Fibromyalgia    Past Surgical History  Procedure Laterality Date  . Cholecystectomy    . Tonsillectomy    . Bladder suspension  2004, 2012   History  Substance Use Topics  . Smoking status: Never Smoker   . Smokeless tobacco: Never Used  . Alcohol Use: No   Family History  Problem Relation Age of Onset  . Cancer Father   . Diabetes Father   . Heart disease Father   . Heart disease Mother    Allergies  Allergen Reactions  . Lipitor (Atorvastatin)     Itching   . Sulfa Antibiotics Rash   Current Outpatient Prescriptions on File Prior to Visit  Medication Sig Dispense Refill  . albuterol (PROVENTIL HFA;VENTOLIN HFA) 108 (90 BASE) MCG/ACT inhaler Inhale 2 puffs into the lungs every 6 (six) hours as needed for wheezing.  1 Inhaler  0  . atorvastatin (LIPITOR) 20 MG tablet Take 1 tablet (20 mg total) by mouth daily.  90 tablet  0  . BIOTIN 5000 PO Take by mouth.      . Ciclopirox 1 % shampoo USE AS DIRECTED  120 mL  0  . diphenhydrAMINE (BENADRYL) 25 MG tablet Take by mouth as directed      . Ibuprofen 200 MG CAPS Take 200 mg by mouth daily.      . lansoprazole (PREVACID) 30 MG capsule TAKE ONE CAPSULE DAILY  90 capsule  1  . Linaclotide (LINZESS) 145 MCG CAPS Take 1 capsule (145 mcg total) by mouth daily.  30 capsule  0  . MAGNESIUM PO Take by mouth.      . mirabegron ER (MYRBETRIQ) 25 MG TB24 Take 1 tablet (25 mg total) by mouth daily.  30 tablet  11  . Multiple Vitamins-Minerals (CENTRUM PO) Take one by mouth daily      . NAMENDA 10 MG tablet TAKE ONE TABLET TWICE A DAY  60 tablet  5  . polyethylene glycol powder (MIRALAX) powder Take 17 g by mouth daily.      . ranitidine (ZANTAC) 150 MG tablet Take 150 mg by mouth 2 (two) times daily.      [name] triamcinolone (NASACORT) 55 MCG/ACT nasal inhaler Place 2 sprays into the nose daily.      [name] venlafaxine XR (EFFEXOR-XR) 150 MG 24 hr capsule TAKE ONE  CAPSULE DAILY  30 capsule  2   No current facility-administered medications on file prior to visit.   The PMH, PSH, Social History, Family History, Medications, and allergies have been reviewed in [facility], and have been updated if relevant.   Review of Systems See HPI Patient reports no  vision/ hearing changes,anorexia, weight change, fever ,adenopathy, persistant / recurrent hoarseness, swallowing issues, chest pain, edema,persistant / recurrent cough, hemoptysis, major joint swelling, breast masses or abnormal vaginal bleeding.       Objective:   Physical Exam BP 118/76  Pulse 60  Temp(Src) 98 F (36.7 C)  Ht 5' 3.75" (1.619 m)  Wt 167 lb (75.751 kg)  BMI 28.9 kg/m2  General:  Well-developed,well-nourished,in no acute distress; alert,appropriate and cooperative throughout examination Head:  normocephalic and atraumatic.   Eyes:  vision grossly intact, pupils equal, pupils round, and pupils reactive to light.   Ears:  R ear normal and L ear normal.   Nose:  no external deformity.   Lungs:  Normal respiratory effort, chest expands symmetrically. Lungs are clear to auscultation, no crackles or wheezes. Heart:  Normal rate and regular rhythm. S1 and S2 normal without gallop, murmur, click, rub or other extra sounds. Abdomen:  Bowel sounds positive,abdomen soft and non-tender without masses, organomegaly or hernias noted. Msk:  No deformity or scoliosis noted of thoracic or lumbar spine.   Extremities:  No clubbing, cyanosis, edema, or deformity noted with normal full range of motion of all joints.   Neurologic:  alert & oriented X3 and gait normal.   Skin:  Intact without suspicious lesions or rashes Psych:  Cognition and judgment appear intact. Alert and cooperative with normal attention span and concentration. No apparent delusions, illusions, hallucinations        Assessment & Plan:    1. Dysphagia, unspecified(787.20)  Endoscopy scheduled for next month.  2.  Constipation Has only taken two doses of Linzess.  Advised it will take a little more time to see results.  3. Depression Stable.    4. Hyperlipidemia Recheck labs today. - Lipid Profile; Future  5. Routine general medical examination at a health care facility The patients weight, height, BMI and visual acuity have been recorded in the chart I have made referrals, counseling and provided education to the patient based review of the above and I have provided the pt with a written personalized care plan for preventive services.  - Comp Met (CMET); Future - Magnesium; Future  6. Screening for ischemic heart disease   7. DNR (do not resuscitate)  8. Other screening mammogram  -  MM Digital Screening; Future

## 2012-11-16 ENCOUNTER — Encounter: Payer: Self-pay | Admitting: *Deleted

## 2012-11-17 ENCOUNTER — Telehealth: Payer: Self-pay | Admitting: Gastroenterology

## 2012-11-17 MED ORDER — LINACLOTIDE 145 MCG PO CAPS: 145.0000 ug | ORAL_CAPSULE | Freq: Every day | ORAL | Status: DC

## 2012-11-17 NOTE — Telephone Encounter (Signed)
Called and let pt. Know Linzess was sent to her pharmacy

## 2012-11-18 DIAGNOSIS — J189 Pneumonia, unspecified organism: Secondary | ICD-10-CM

## 2012-11-18 HISTORY — DX: Pneumonia, unspecified organism: J18.9

## 2012-11-19 ENCOUNTER — Telehealth: Payer: Self-pay | Admitting: Family Medicine

## 2012-11-19 DIAGNOSIS — R05 Cough: Secondary | ICD-10-CM | POA: Diagnosis not present

## 2012-11-19 DIAGNOSIS — J45909 Unspecified asthma, uncomplicated: Secondary | ICD-10-CM | POA: Diagnosis not present

## 2012-11-19 DIAGNOSIS — R0989 Other specified symptoms and signs involving the circulatory and respiratory systems: Secondary | ICD-10-CM | POA: Diagnosis not present

## 2012-11-19 MED ORDER — AZITHROMYCIN 250 MG PO TABS: ORAL_TABLET | ORAL | Status: DC

## 2012-11-19 NOTE — Telephone Encounter (Signed)
Left message on voice mail advising pt script has been sent to pharmacy.

## 2012-11-19 NOTE — Telephone Encounter (Signed)
Rx for zpack sent to her pharmacy.  Please keep [place] posted and have a good trip.

## 2012-11-19 NOTE — Telephone Encounter (Signed)
Pt states that she was here for a physical on 6/23 but that she has started to develop a worsening cough and dark yellow mucous, no fever.  She is leaving for [place] in the morning and would like to know if something could be called in for her so that she is not sick on her trip.

## 2012-11-22 DIAGNOSIS — K219 Gastro-esophageal reflux disease without esophagitis: Secondary | ICD-10-CM | POA: Diagnosis not present

## 2012-11-22 DIAGNOSIS — I959 Hypotension, unspecified: Secondary | ICD-10-CM | POA: Diagnosis not present

## 2012-11-22 DIAGNOSIS — F329 Major depressive disorder, single episode, unspecified: Secondary | ICD-10-CM | POA: Diagnosis not present

## 2012-12-02 ENCOUNTER — Ambulatory Visit (AMBULATORY_SURGERY_CENTER): Payer: Medicare Other | Admitting: Gastroenterology

## 2012-12-02 ENCOUNTER — Encounter: Payer: Self-pay | Admitting: Gastroenterology

## 2012-12-02 VITALS — BP 108/70 | HR 52 | Temp 96.5°F | Resp 14 | Ht 64.0 in | Wt 168.0 lb

## 2012-12-02 DIAGNOSIS — K219 Gastro-esophageal reflux disease without esophagitis: Secondary | ICD-10-CM | POA: Diagnosis not present

## 2012-12-02 DIAGNOSIS — K5732 Diverticulitis of large intestine without perforation or abscess without bleeding: Secondary | ICD-10-CM | POA: Diagnosis not present

## 2012-12-02 DIAGNOSIS — R1310 Dysphagia, unspecified: Secondary | ICD-10-CM | POA: Diagnosis not present

## 2012-12-02 DIAGNOSIS — F329 Major depressive disorder, single episode, unspecified: Secondary | ICD-10-CM | POA: Diagnosis not present

## 2012-12-02 MED ORDER — SODIUM CHLORIDE 0.9 % IV SOLN: 500.0000 mL | INTRAVENOUS | Status: DC

## 2012-12-02 NOTE — Progress Notes (Signed)
Stable to RR 

## 2012-12-02 NOTE — Op Note (Addendum)
[facility] [address] [place], [address]   ENDOSCOPY PROCEDURE REPORT  PATIENT: Jamie, Matthews  MR#: [id number] BIRTHDATE: 1940/09/04 , 72  yrs. old GENDER: Female ENDOSCOPIST: Louis Meckel, MD REFERRED BY:  Ruthe Mannan, M.D. PROCEDURE DATE:  12/02/2012 PROCEDURE:  EGD, diagnostic and Maloney dilation of esophagus ASA CLASS:     Class II INDICATIONS:  Dysphagia. MEDICATIONS: MAC sedation, administered by CRNA, propofol (Diprivan) 100mg  IV, and Simethicone 0.6cc PO TOPICAL ANESTHETIC:  DESCRIPTION OF PROCEDURE: After the risks benefits and alternatives of the procedure were thoroughly explained, informed consent was obtained.  The LB [id number] endoscope was introduced through the mouth and advanced to the third portion of the duodenum. Without limitations.  The instrument was slowly withdrawn as the mucosa was fully examined.      There were multiple 3-5 mm sessile polyps in the gastric fundus and body with smooth overlying mucosa consistent with fundic gland polyps.   The remainder of the upper endoscopy exam was otherwise normal.  Retroflexed views revealed no abnormalities.    A number 52Fr maloney dilator was passed with minimal resistance because of complalints of dysphagia.  There was no heme. The scope was then withdrawn from the patient and the procedure completed.  COMPLICATIONS: There were no complications. ENDOSCOPIC IMPRESSION: 1.   There were multiple 3-5 mm sessile polyps in the gastric fundus and body with smooth overlying mucosa consistent with fundic gland polyps. 2.   s/p maloney dilation for dysphagia  RECOMMENDATIONS: repeat dilatation as needed REPEAT EXAM:  eSigned:  Louis Meckel, MD 12/06/2012 11:07 AM Revised: 12/06/2012 11:07 AM  CC:

## 2012-12-02 NOTE — Patient Instructions (Addendum)
Discharge instructions given with verbal understanding. Handout on a dilatation diet given. Resume previous medications. YOU HAD AN ENDOSCOPIC PROCEDURE TODAY AT THE  ENDOSCOPY CENTER: Refer to the procedure report that was given to you for any specific questions about what was found during the examination.  If the procedure report does not answer your questions, please call your gastroenterologist to clarify.  If you requested that your care partner not be given the details of your procedure findings, then the procedure report has been included in a sealed envelope for you to review at your convenience later.  YOU SHOULD EXPECT: Some feelings of bloating in the abdomen. Passage of more gas than usual.  Walking can help get rid of the air that was put into your GI tract during the procedure and reduce the bloating. If you had a lower endoscopy (such as a colonoscopy or flexible sigmoidoscopy) you may notice spotting of blood in your stool or on the toilet paper. If you underwent a bowel prep for your procedure, then you may not have a normal bowel movement for a few days.  DIET: Your first meal following the procedure should be a light meal and then it is ok to progress to your normal diet.  A half-sandwich or bowl of soup is an example of a good first meal.  Heavy or fried foods are harder to digest and may make you feel nauseous or bloated.  Likewise meals heavy in dairy and vegetables can cause extra gas to form and this can also increase the bloating.  Drink plenty of fluids but you should avoid alcoholic beverages for 24 hours.  ACTIVITY: Your care partner should take you home directly after the procedure.  You should plan to take it easy, moving slowly for the rest of the day.  You can resume normal activity the day after the procedure however you should NOT DRIVE or use heavy machinery for 24 hours (because of the sedation medicines used during the test).    SYMPTOMS TO REPORT  IMMEDIATELY: A gastroenterologist can be reached at any hour.  During normal business hours, 8:30 AM to 5:00 PM Monday through Friday, call [phone].  After hours and on weekends, please call the GI answering service at [phone] who will take a message and have the physician on call contact you.   Following upper endoscopy (EGD)  Vomiting of blood or coffee ground material  New chest pain or pain under the shoulder blades  Painful or persistently difficult swallowing  New shortness of breath  Fever of 100F or higher  Black, tarry-looking stools  FOLLOW UP: If any biopsies were taken you will be contacted by phone or by letter within the next 1-3 weeks.  Call your gastroenterologist if you have not heard about the biopsies in 3 weeks.  Our staff will call the home number listed on your records the next business day following your procedure to check on you and address any questions or concerns that you may have at that time regarding the information given to you following your procedure. This is a courtesy call and so if there is no answer at the home number and we have not heard from you through the emergency physician on call, we will assume that you have returned to your regular daily activities without incident.  SIGNATURES/CONFIDENTIALITY: You and/or your care partner have signed paperwork which will be entered into your electronic medical record.  These signatures attest to the fact that that the information   above on your After Visit Summary has been reviewed and is understood.  Full responsibility of the confidentiality of this discharge information lies with you and/or your care-partner. 

## 2012-12-02 NOTE — Progress Notes (Signed)
Patient did not experience any of the following events: a burn prior to discharge; a fall within the facility; wrong site/side/patient/procedure/implant event; or a hospital transfer or hospital admission upon discharge from the facility. (G8907) Patient did not have preoperative order for IV antibiotic SSI prophylaxis. (G8918)  

## 2012-12-03 ENCOUNTER — Telehealth: Payer: Self-pay | Admitting: *Deleted

## 2012-12-03 NOTE — Telephone Encounter (Signed)
  Follow up Call-  Call back number 12/02/2012  Post procedure Call Back phone  # [phone]  Permission to leave phone message Yes     Patient questions:  Do you have a fever, pain , or abdominal swelling? no Pain Score  0 *  Have you tolerated food without any problems? yes  Have you been able to return to your normal activities? yes  Do you have any questions about your discharge instructions: Diet   no Medications  no Follow up visit  no  Do you have questions or concerns about your Care? no  Actions: * If pain score is 4 or above: No action needed, pain <4.

## 2012-12-06 ENCOUNTER — Ambulatory Visit: Payer: Self-pay | Admitting: Family Medicine

## 2012-12-06 DIAGNOSIS — Z1231 Encounter for screening mammogram for malignant neoplasm of breast: Secondary | ICD-10-CM | POA: Diagnosis not present

## 2012-12-08 DIAGNOSIS — D2330 Other benign neoplasm of skin of unspecified part of face: Secondary | ICD-10-CM | POA: Diagnosis not present

## 2012-12-08 DIAGNOSIS — L820 Inflamed seborrheic keratosis: Secondary | ICD-10-CM | POA: Diagnosis not present

## 2012-12-08 DIAGNOSIS — L919 Hypertrophic disorder of the skin, unspecified: Secondary | ICD-10-CM | POA: Diagnosis not present

## 2012-12-08 DIAGNOSIS — L219 Seborrheic dermatitis, unspecified: Secondary | ICD-10-CM | POA: Diagnosis not present

## 2012-12-08 DIAGNOSIS — L821 Other seborrheic keratosis: Secondary | ICD-10-CM | POA: Diagnosis not present

## 2012-12-08 DIAGNOSIS — L723 Sebaceous cyst: Secondary | ICD-10-CM | POA: Diagnosis not present

## 2012-12-08 DIAGNOSIS — L909 Atrophic disorder of skin, unspecified: Secondary | ICD-10-CM | POA: Diagnosis not present

## 2012-12-08 DIAGNOSIS — L819 Disorder of pigmentation, unspecified: Secondary | ICD-10-CM | POA: Diagnosis not present

## 2012-12-13 ENCOUNTER — Encounter: Payer: Self-pay | Admitting: Family Medicine

## 2012-12-15 ENCOUNTER — Encounter: Payer: Self-pay | Admitting: Family Medicine

## 2012-12-15 ENCOUNTER — Encounter: Payer: Self-pay | Admitting: *Deleted

## 2012-12-15 LAB — HM MAMMOGRAPHY: HM Mammogram: NORMAL

## 2012-12-24 ENCOUNTER — Telehealth: Payer: Self-pay | Admitting: *Deleted

## 2012-12-24 MED ORDER — HYDROCORTISONE ACE-PRAMOXINE 1-1 % RE CREA: TOPICAL_CREAM | Freq: Two times a day (BID) | RECTAL | Status: DC

## 2012-12-24 NOTE — Telephone Encounter (Signed)
Pharmacy sent fax stating that pt requested refill of generic Analpram HC  (hydrocortisone 2.5% and Pramoxine 1% cream)from pharmacy. Pt had rx for it in the past, was not sure who filled it and what pharmacy it was sent to. Will you approve rx or does pt need appt for eval?

## 2012-12-24 NOTE — Telephone Encounter (Signed)
Ok to refill one time only. 

## 2012-12-24 NOTE — Telephone Encounter (Signed)
Refill called to [facility]. 

## 2013-01-19 ENCOUNTER — Ambulatory Visit (INDEPENDENT_AMBULATORY_CARE_PROVIDER_SITE_OTHER): Payer: Medicare Other | Admitting: Adult Health

## 2013-01-19 ENCOUNTER — Telehealth: Payer: Self-pay | Admitting: Family Medicine

## 2013-01-19 ENCOUNTER — Encounter: Payer: Self-pay | Admitting: Adult Health

## 2013-01-19 ENCOUNTER — Telehealth: Payer: Self-pay | Admitting: Gastroenterology

## 2013-01-19 VITALS — BP 120/60 | HR 60 | Temp 98.3°F | Resp 12 | Wt 167.5 lb

## 2013-01-19 DIAGNOSIS — S30861A Insect bite (nonvenomous) of abdominal wall, initial encounter: Secondary | ICD-10-CM

## 2013-01-19 DIAGNOSIS — S30860A Insect bite (nonvenomous) of lower back and pelvis, initial encounter: Secondary | ICD-10-CM

## 2013-01-19 MED ORDER — DOXYCYCLINE HYCLATE 100 MG PO TABS: 100.0000 mg | ORAL_TABLET | Freq: Two times a day (BID) | ORAL | Status: DC

## 2013-01-19 NOTE — Progress Notes (Signed)
Subjective:    Patient ID: Jamie Matthews, female    DOB: 1940-10-17, 72 y.o.   MRN: [id number]  HPI  Patient is a 72 year old female who presents to clinic status post finding a tick on her abdomen. She reports that the tick was attached on the left side of the abdomen. It was removed via tweezers by her husband. It is unknown the length of time that the tick was attached. She describes what appears to be a deer tick. She is concerned because she had a friend who was treated for Lyme's disease and describes the ongoing problems that her friend experience. She is complaining of itching at the site.   Current Outpatient Prescriptions on File Prior to Visit  Medication Sig Dispense Refill  . albuterol (PROVENTIL HFA;VENTOLIN HFA) 108 (90 BASE) MCG/ACT inhaler Inhale 2 puffs into the lungs every 6 (six) hours as needed for wheezing.  1 Inhaler  0  . atorvastatin (LIPITOR) 20 MG tablet Take 1 tablet (20 mg total) by mouth daily.  90 tablet  0  . BIOTIN 5000 PO Take by mouth.      . Ciclopirox 1 % shampoo USE AS DIRECTED  120 mL  0  . diphenhydrAMINE (BENADRYL) 25 MG tablet Take by mouth as directed      . Ibuprofen 200 MG CAPS Take 200 mg by mouth daily.      . lansoprazole (PREVACID) 30 MG capsule TAKE ONE CAPSULE DAILY  90 capsule  1  . Linaclotide (LINZESS) 145 MCG CAPS Take 1 capsule (145 mcg total) by mouth daily.  30 capsule  3  . MAGNESIUM PO Take by mouth.      . mirabegron ER (MYRBETRIQ) 50 MG TB24 Take 1 tablet (50 mg total) by mouth daily.  30 tablet  6  . Multiple Vitamins-Minerals (CENTRUM PO) Take one by mouth daily      . NAMENDA 10 MG tablet TAKE ONE TABLET TWICE A DAY  60 tablet  5  . pramoxine-hydrocortisone (ANALPRAM-HC) 1-1 % rectal cream Place rectally 2 (two) times daily.  30 g  0  . ranitidine (ZANTAC) 150 MG tablet Take 150 mg by mouth 2 (two) times daily.      [name] triamcinolone (NASACORT) 55 MCG/ACT nasal inhaler Place 2 sprays into the nose daily.      [name] venlafaxine  XR (EFFEXOR-XR) 150 MG 24 hr capsule TAKE ONE CAPSULE DAILY  30 capsule  2   No current facility-administered medications on file prior to visit.    Review of Systems  Constitutional: Negative.   Respiratory: Negative.   Cardiovascular: Negative.   Skin: Negative for rash.       Itchy, red area on left side of abdomen    BP 120/60  Pulse 60  Temp(Src) 98.3 F (36.8 C) (Oral)  Resp 12  Wt 167 lb 8 oz (75.978 kg)  BMI 28.74 kg/m2  SpO2 96%    Objective:   Physical Exam  Constitutional: She is oriented to person, place, and time.  Overweight, 72 year old female in no apparent distress  Cardiovascular: Normal rate and regular rhythm.   Pulmonary/Chest: Effort normal. No respiratory distress.  Abdominal: Soft.  Musculoskeletal: Normal range of motion.  Neurological: She is alert and oriented to person, place, and time.  Skin: Skin is warm and dry. No rash noted.  Area on the left side of the abdomen at level of umbilicus slightly erythematous. There is no bullseye appearance. The skin surrounding area is intact.  Psychiatric: She has a normal mood and affect. Her behavior is normal. Judgment and thought content normal.        Assessment & Plan:

## 2013-01-19 NOTE — Telephone Encounter (Signed)
Left message for pt to call back. °See additional phone note. °

## 2013-01-19 NOTE — Telephone Encounter (Signed)
Left message on voice mail asking pt to call back.

## 2013-01-19 NOTE — Telephone Encounter (Signed)
Noted reviewed and agreed she must be seen to receive abx.

## 2013-01-19 NOTE — Telephone Encounter (Signed)
Patient Information:  Caller Name: Shuronda  Phone: [phone]  Patient: Jamie Matthews, Jamie Matthews  Gender: Female  DOB: December 09, 1940  Age: 72 Years  PCP: Ruthe Mannan [facility]  Office Follow Up:  Does the office need to follow up with this patient?: No  Instructions For The Office: N/A  RN Note:  Informed must be seen for antibiotics per MD order.  Tick bite area near umbilicus is itchy with red ring. No appointments remain at [facility].  Unable to see another MD until after 1300 due to another appointment at 1115.  Scheduled for 1330 01/19/13 with Christy Gentles, NP at [facility] office.    Symptoms  Reason For Call & Symptoms: Removed imbedded deer tick from left of umbilicus. Called to request antibiotic be sent to pharmacy.  Reviewed Health History In EMR: Yes  Reviewed Medications In EMR: Yes  Reviewed Allergies In EMR: Yes  Reviewed Surgeries / Procedures: No  Date of Onset of Symptoms: 01/11/2013  Treatments Tried: Removed tick with tweezers, cleansed with Alcohol.  Treatments Tried Worked: No  Guideline(s) Used:  Tick Bite  Disposition Per Guideline:   See Today in Office  Reason For Disposition Reached:   Red ring or bull's-eye rash occurs around a deer tick bite  Advice Given:  Tiny Deer Tick Removal:  Deer ticks are very small and need to be scraped off with a credit card edge or the edge of a knife blade.  Antibiotic Ointment:  Wash the wound and your hands with soap and water after removal to prevent catching any tick disease. Apply an over-the-counter antibiotic ointment (e.g., bacitracin) to the bite once.  Expected Course:  Tick bites normally do not itch or hurt. That is why they often go unnoticed.  Call Back If:  Fever or rash occur in the next 2 weeks  Bite begins to look infected  You become worse.  Patient Will Follow Care Advice:  YES

## 2013-01-19 NOTE — Telephone Encounter (Signed)
Pt left v/m she has already spoken with [facility] but wanted to double ck that Dr Dayton Martes got the message; pt is requesting antibiotic;on 01/18/13 pt found deer tick embedded in umbilical area. Pt said has received antibiotic in past for tick.Please advise.

## 2013-01-19 NOTE — Patient Instructions (Addendum)
Start Doxycycline 100 mg twice a day with a meal.  Please take with food. This medication can be harsh on the stomach.  Deer Tick Bite Deer ticks are brown arachnids (spider family) that vary in size from as small as the head of a pin to 1/4 inch (1/2 cm) diameter. They thrive in wooded areas. Deer are the preferred host of adult deer ticks. Small rodents are the host of young ticks (nymphs). When a person walks in a field or wooded area, young and adult ticks in the surrounding grass and vegetation can attach themselves to the skin. They can suck blood for hours to days if unnoticed. Ticks are found all over the [place] Some ticks carry a specific bacteria (Borrelia burgdorferi) that causes an infection called Lyme disease. The bacteria is typically passed into a person during the blood sucking process. This happens after the tick has been attached for at least a number of hours. While ticks can be found all over the [place] those carrying the bacteria that causes Lyme disease are most common in [place] and the [place]. Only a small proportion of ticks in these areas carry the Lyme disease bacteria and cause human infections. Ticks usually attach to warm spots on the body, such as the:  Head.  Back.  Neck.  Armpits.  Groin. SYMPTOMS  Most of the time, a deer tick bite will not be felt. You may or may not see the attached tick. You may notice mild irritation or redness around the bite site. If the deer tick passes the Lyme disease bacteria to a person, a round, red rash may be noticed 2 to 3 days after the bite. The rash may be clear in the middle, like a bull's-eye or target. If not treated, other symptoms may develop several days to weeks after the onset of the rash. These symptoms may include:  New rash lesions.  Fatigue and weakness.  General ill feeling and achiness.  Chills.  Headache and neck pain.  Swollen lymph glands.  Sore muscles and joints. 5 to 15% of untreated  people with Lyme disease may develop more severe illnesses after several weeks to months. This may include inflammation of the brain lining (meningitis), nerve palsies, an abnormal heartbeat, or severe muscle and joint pain and inflammation (myositis or arthritis). DIAGNOSIS   Physical exam and medical history.  Viewing the tick if it was saved for confirmation.  Blood tests (to check or confirm the presence of Lyme disease). TREATMENT  Most ticks do not carry disease. If found, an attached tick should be removed using tweezers. Tweezers should be placed under the body of the tick so it is removed by its attachment parts (pincers). If there are signs or symptoms of being sick, or Lyme disease is confirmed, medicines (antibiotics) that kill germs are usually prescribed. In more severe cases, antibiotics may be given through an intravenous (IV) access. HOME CARE INSTRUCTIONS   Always remove ticks with tweezers. Do not use petroleum jelly or other methods to kill or remove the tick. Slide the tweezers under the body and pull out as much as you can. If you are not sure what it is, save it in a jar and show your caregiver.  Once you remove the tick, the skin will heal on its own. Wash your hands and the affected area with water and soap. You may place a bandage on the affected area.  Take medicine as directed. You may be advised to take a full  course of antibiotics.  Follow up with your caregiver as recommended. FINDING OUT THE RESULTS OF YOUR TEST Not all test results are available during your visit. If your test results are not back during the visit, make an appointment with your caregiver to find out the results. Do not assume everything is normal if you have not heard from your caregiver or the medical facility. It is important for you to follow up on all of your test results. PROGNOSIS  If Lyme disease is confirmed, early treatment with antibiotics is very effective. Following preventive  guidelines is important since it is possible to get the disease more than once. PREVENTION   Wear long sleeves and long pants in wooded or grassy areas. Tuck your pants into your socks.  Use an insect repellent while hiking.  Check yourself, your children, and your pets regularly for ticks after playing outside.  Clear piles of leaves or brush from your yard. Ticks might live there. SEEK MEDICAL CARE IF:   You or your child has an oral temperature above 102 F (38.9 C).  You develop a severe headache following the bite.  You feel generally ill.  You notice a rash.  You are having trouble removing the tick.  The bite area has red skin or yellow drainage. SEEK IMMEDIATE MEDICAL CARE IF:   Your face is weak and droopy or you have other neurological symptoms.  You have severe joint pain or weakness. MAKE SURE YOU:   Understand these instructions.  Will watch your condition.  Will get help right away if you are not doing well or get worse. FOR MORE INFORMATION Centers for Disease Control and Prevention: FootballExhibition.com.br American Academy [employer]icians: www.https://powers.com/ Document Released: 08/06/2009 Document Revised: 08/04/2011 Document Reviewed: 08/06/2009 [facility] Patient Information 2014 [place], [place].

## 2013-01-19 NOTE — Assessment & Plan Note (Addendum)
Uncertain length of time tick was attached to her abdomen. The tick was completely removed. Treat empirically for tick borne illness. Start doxycycline 100 mg twice a day x10 days. Advised patient to take doxy with food. RTC if symptoms develop such as fever, headache, rash. May apply hydrocortisone cream to affected area for itching. Patient also mentions that she has Benadryl cream. She may try this as well.

## 2013-01-20 ENCOUNTER — Telehealth: Payer: Self-pay | Admitting: Gastroenterology

## 2013-01-20 NOTE — Telephone Encounter (Signed)
Spoke with patient, she was seen at [facility] in [place] yesterday and given antibiotic.

## 2013-01-20 NOTE — Telephone Encounter (Signed)
Left message for pt to call back  °

## 2013-01-21 ENCOUNTER — Other Ambulatory Visit: Payer: Self-pay | Admitting: *Deleted

## 2013-01-21 MED ORDER — ATORVASTATIN CALCIUM 20 MG PO TABS: 20.0000 mg | ORAL_TABLET | Freq: Every day | ORAL | Status: DC

## 2013-01-25 NOTE — Telephone Encounter (Signed)
Pt states that she has been taking Linzess daily and that some days it works and other times she has to add miralax to it along with prunes. Would pt be ok to try Linzess daily? Please advise.

## 2013-01-25 NOTE — Telephone Encounter (Signed)
Left message for pt to call back  °

## 2013-01-25 NOTE — Telephone Encounter (Signed)
Okay to try higher dose

## 2013-01-26 NOTE — Telephone Encounter (Signed)
Pt aware.

## 2013-01-28 ENCOUNTER — Telehealth: Payer: Self-pay | Admitting: Gastroenterology

## 2013-01-28 MED ORDER — LINACLOTIDE 290 MCG PO CAPS: 290.0000 ug | ORAL_CAPSULE | Freq: Every day | ORAL | Status: DC

## 2013-01-28 NOTE — Telephone Encounter (Signed)
Script sent to the pharmacy. 

## 2013-02-10 DIAGNOSIS — Z23 Encounter for immunization: Secondary | ICD-10-CM | POA: Diagnosis not present

## 2013-02-14 ENCOUNTER — Other Ambulatory Visit: Payer: Self-pay | Admitting: *Deleted

## 2013-02-14 MED ORDER — VENLAFAXINE HCL ER 150 MG PO CP24: ORAL_CAPSULE | ORAL | Status: DC

## 2013-02-15 ENCOUNTER — Telehealth: Payer: Self-pay | Admitting: Gastroenterology

## 2013-02-15 NOTE — Telephone Encounter (Signed)
Left message for pt to call back  °

## 2013-02-16 NOTE — Telephone Encounter (Signed)
yes

## 2013-02-16 NOTE — Telephone Encounter (Signed)
Do you mean 1 bottle of Miralax? Please clarify.

## 2013-02-16 NOTE — Telephone Encounter (Signed)
Pt states she increased to Linzess but it is not helping. States she has not had a BM in a day and a half. Pt states she has taken the Linzess, Miralax, and even used an enema. Pt wants to know what Dr. Arlyce Dice recommends. Please advise.

## 2013-02-16 NOTE — Telephone Encounter (Signed)
She doesn't necessarily need to move her bowels daily or even every other day.  Please set her up for an office visit.  Tell her not to worry if she's not having pain.  If she is uncomfortable she can take 1 bottle (119 gm) in 60 cc water

## 2013-02-17 NOTE — Telephone Encounter (Signed)
Pt aware and scheduled to see Dr. Arlyce Dice 03/11/13@10 :30am. Pt aware of appt.

## 2013-02-18 DIAGNOSIS — H04129 Dry eye syndrome of unspecified lacrimal gland: Secondary | ICD-10-CM | POA: Diagnosis not present

## 2013-03-11 ENCOUNTER — Encounter: Payer: Self-pay | Admitting: Gastroenterology

## 2013-03-11 ENCOUNTER — Ambulatory Visit (INDEPENDENT_AMBULATORY_CARE_PROVIDER_SITE_OTHER): Payer: Medicare Other | Admitting: Gastroenterology

## 2013-03-11 VITALS — BP 128/72 | HR 78 | Ht 64.5 in | Wt 166.8 lb

## 2013-03-11 DIAGNOSIS — R1310 Dysphagia, unspecified: Secondary | ICD-10-CM

## 2013-03-11 DIAGNOSIS — K5900 Constipation, unspecified: Secondary | ICD-10-CM

## 2013-03-11 NOTE — Progress Notes (Signed)
History of Present Illness: The patient has returned following upper endoscopy for dysphagia.  No frank stricture was seen but dysphagia is improved since dilation.  Her main complaint is constipation.  After increasing Linzess to 290 micrograms daily she is moving her bowels more regularly.  On occasion she takes MiraLax.    Review of Systems: She complains of right shoulder pain Pertinent positive and negative review of systems were noted in the above HPI section. All other review of systems were otherwise negative.    Current Medications, Allergies, Past Medical History, Past Surgical History, Family History and Social History were reviewed in [facility] electronic medical record  Vital signs were reviewed in today's medical record. Physical Exam: General: Well developed , well nourished, no acute distress

## 2013-03-11 NOTE — Assessment & Plan Note (Signed)
On a regimen of Linzess 290 micrograms daily plus occasional MiraLax she is moving her bowels every other day to every third day.  Plan to continue with the same regimen.   

## 2013-03-11 NOTE — Assessment & Plan Note (Signed)
Improved following dilatation therapy.  Plan repeat as needed. 

## 2013-03-11 NOTE — Patient Instructions (Signed)
Follow up as needed

## 2013-03-20 ENCOUNTER — Emergency Department: Payer: Self-pay | Admitting: Emergency Medicine

## 2013-03-20 DIAGNOSIS — R1032 Left lower quadrant pain: Secondary | ICD-10-CM | POA: Diagnosis not present

## 2013-03-20 DIAGNOSIS — I10 Essential (primary) hypertension: Secondary | ICD-10-CM | POA: Diagnosis not present

## 2013-03-20 DIAGNOSIS — R109 Unspecified abdominal pain: Secondary | ICD-10-CM | POA: Diagnosis not present

## 2013-03-20 DIAGNOSIS — R112 Nausea with vomiting, unspecified: Secondary | ICD-10-CM | POA: Diagnosis not present

## 2013-03-20 DIAGNOSIS — Z9089 Acquired absence of other organs: Secondary | ICD-10-CM | POA: Diagnosis not present

## 2013-03-20 DIAGNOSIS — R1110 Vomiting, unspecified: Secondary | ICD-10-CM | POA: Diagnosis not present

## 2013-03-20 DIAGNOSIS — R52 Pain, unspecified: Secondary | ICD-10-CM | POA: Diagnosis not present

## 2013-03-20 LAB — COMPREHENSIVE METABOLIC PANEL
BUN: 15 mg/dL (ref 7–18)
Calcium, Total: 9.6 mg/dL (ref 8.5–10.1)
Chloride: 101 mmol/L (ref 98–107)
Co2: 30 mmol/L (ref 21–32)
Glucose: 132 mg/dL — ABNORMAL HIGH (ref 65–99)
Osmolality: 278 (ref 275–301)
SGOT(AST): 23 U/L (ref 15–37)
Sodium: 138 mmol/L (ref 136–145)
Total Protein: 7.6 g/dL (ref 6.4–8.2)

## 2013-03-20 LAB — CBC
HCT: 37.9 % (ref 35.0–47.0)
HGB: 13.2 g/dL (ref 12.0–16.0)
RBC: 4.04 10*6/uL (ref 3.80–5.20)
WBC: 8.5 10*3/uL (ref 3.6–11.0)

## 2013-03-20 LAB — LIPASE, BLOOD: Lipase: 72 U/L — ABNORMAL LOW (ref 73–393)

## 2013-03-21 ENCOUNTER — Ambulatory Visit: Payer: Medicare Other | Admitting: Family Medicine

## 2013-03-21 DIAGNOSIS — R1110 Vomiting, unspecified: Secondary | ICD-10-CM | POA: Diagnosis not present

## 2013-03-21 DIAGNOSIS — R1032 Left lower quadrant pain: Secondary | ICD-10-CM | POA: Diagnosis not present

## 2013-03-21 LAB — URINALYSIS, COMPLETE
Bacteria: NONE SEEN
Bilirubin,UR: NEGATIVE
Blood: NEGATIVE
Glucose,UR: NEGATIVE mg/dL (ref 0–75)
Leukocyte Esterase: NEGATIVE
Ph: 8 (ref 4.5–8.0)
RBC,UR: 1 /HPF (ref 0–5)
Specific Gravity: 1.006 (ref 1.003–1.030)
Squamous Epithelial: NONE SEEN

## 2013-03-24 ENCOUNTER — Encounter: Payer: Self-pay | Admitting: Family Medicine

## 2013-03-24 ENCOUNTER — Ambulatory Visit (INDEPENDENT_AMBULATORY_CARE_PROVIDER_SITE_OTHER): Payer: Medicare Other | Admitting: Family Medicine

## 2013-03-24 VITALS — BP 98/62 | HR 74 | Temp 97.4°F | Wt 160.2 lb

## 2013-03-24 DIAGNOSIS — R112 Nausea with vomiting, unspecified: Secondary | ICD-10-CM | POA: Diagnosis not present

## 2013-03-24 MED ORDER — SUCRALFATE 1 GM/10ML PO SUSP: 1.0000 g | Freq: Three times a day (TID) | ORAL | Status: DC

## 2013-03-24 NOTE — Progress Notes (Addendum)
  Subjective:    Patient ID: Jamie Matthews, female    DOB: 1941/01/02, 72 y.o.   MRN: [id number]  HPI CC: f/u visit  Pleasant 72 yo patient of Dr. Elmer Sow presents today for "f/u".  Presents for f/u for recent ER visit brought on by vomiting with nausea (NBNB).  Went to ER Sunday 03/20/2013.  Treated at [facility] ER with IV fluids, nausea medicine, and CT scan.  No records available currently - I have asked to request. Never fever.  + lower abd pain.  Chronic constipation issues. Urine checked and per patient normal.  Blood work was stable. Sent home with phenergan which helps. Feeling some better but not back to normal.  Improving energy. Drinking small sips of water and diet 7 up throughout the day.  Appetite slowly coming back. Mild dizziness and dull headache off and on. Last BM - today.  Small amount.  Passing gas ok.  Recently worsening constipation - taking linzess.  Also taking miralax.  Recently worsening GERD despite prevacid 30mg  daily and zantac nightly.    Recent EGD with dilation by Dr. Arlyce Dice (11/2012) where several gastric polyps were found.  No current dysphagia.  Past Medical History  Diagnosis Date  . Urinary incontinence   . Depression   . GERD (gastroesophageal reflux disease)   . Diverticulitis   . Hyperlipidemia   . Fibromyalgia   . Pneumonia 11/18/12     Review of Systems Per HPI    Objective:   Physical Exam  Nursing note and vitals reviewed. Constitutional: She appears well-developed and well-nourished. No distress.  HENT:  Mouth/Throat: Oropharynx is clear and moist. No oropharyngeal exudate.  Cardiovascular: Normal rate, regular rhythm, normal heart sounds and intact distal pulses.   No murmur heard. Pulmonary/Chest: Effort normal and breath sounds normal. No respiratory distress. She has no wheezes. She has no rales.  Abdominal: Soft. Normal appearance and bowel sounds are normal. She exhibits no distension and no mass. There is no  hepatosplenomegaly. There is tenderness (minimal) in the right upper quadrant. There is no rigidity, no rebound, no guarding and negative Murphy's sign.  Musculoskeletal: She exhibits no edema.       Assessment & Plan:  ADDENDUM ==>received, reviewed records from [facility] WBC 8.5, Hgb 13.2, plt 237 Glu 132, Cr 0.8, AST 23, ALT 41, ALP 117, Tbili 0.5, lipase 72 UA WNL Contrasted CT abd/pelvis - normal (asked to scan)

## 2013-03-24 NOTE — Patient Instructions (Signed)
I'm glad you're feeling better slowly Continue prevacid - continue ranitidine nightly. Take gas X for bloating and take carafate to hopefully help with worsening heartburn. Let us know if not improving with this or any worsening.

## 2013-03-24 NOTE — Assessment & Plan Note (Signed)
Will await and review [facility] ER records. Slowly resolving. Anticipate acute viral gastroenteritis. Discussed treatment of this - will add on carafate given recently worsening GERD sxs - continue prevacid and ranitidine. Recommended Gas X symptomatic for bloating. Discussed reasons to return including not improving as expected. No dysphagia endorsed today.

## 2013-04-08 ENCOUNTER — Other Ambulatory Visit: Payer: Self-pay | Admitting: *Deleted

## 2013-04-08 MED ORDER — LANSOPRAZOLE 30 MG PO CPDR: DELAYED_RELEASE_CAPSULE | ORAL | Status: DC

## 2013-04-13 ENCOUNTER — Other Ambulatory Visit: Payer: Self-pay | Admitting: *Deleted

## 2013-04-13 MED ORDER — MEMANTINE HCL 10 MG PO TABS: ORAL_TABLET | ORAL | Status: DC

## 2013-04-30 ENCOUNTER — Emergency Department: Payer: Self-pay | Admitting: Emergency Medicine

## 2013-04-30 DIAGNOSIS — K2900 Acute gastritis without bleeding: Secondary | ICD-10-CM | POA: Diagnosis not present

## 2013-04-30 DIAGNOSIS — R52 Pain, unspecified: Secondary | ICD-10-CM | POA: Diagnosis not present

## 2013-04-30 DIAGNOSIS — R112 Nausea with vomiting, unspecified: Secondary | ICD-10-CM | POA: Diagnosis not present

## 2013-04-30 DIAGNOSIS — Z882 Allergy status to sulfonamides status: Secondary | ICD-10-CM | POA: Diagnosis not present

## 2013-04-30 DIAGNOSIS — R109 Unspecified abdominal pain: Secondary | ICD-10-CM | POA: Diagnosis not present

## 2013-04-30 DIAGNOSIS — K2970 Gastritis, unspecified, without bleeding: Secondary | ICD-10-CM | POA: Diagnosis not present

## 2013-04-30 DIAGNOSIS — R1110 Vomiting, unspecified: Secondary | ICD-10-CM | POA: Diagnosis not present

## 2013-04-30 DIAGNOSIS — Z9089 Acquired absence of other organs: Secondary | ICD-10-CM | POA: Diagnosis not present

## 2013-04-30 LAB — CBC
HCT: 40.2 % (ref 35.0–47.0)
MCH: 32.1 pg (ref 26.0–34.0)
MCHC: 34.2 g/dL (ref 32.0–36.0)
Platelet: 225 10*3/uL (ref 150–440)
RBC: 4.28 10*6/uL (ref 3.80–5.20)
RDW: 13.4 % (ref 11.5–14.5)
WBC: 10.7 10*3/uL (ref 3.6–11.0)

## 2013-04-30 LAB — COMPREHENSIVE METABOLIC PANEL
Albumin: 4.2 g/dL (ref 3.4–5.0)
Alkaline Phosphatase: 93 U/L
Anion Gap: 7 (ref 7–16)
BUN: 17 mg/dL (ref 7–18)
Calcium, Total: 9.5 mg/dL (ref 8.5–10.1)
Creatinine: 0.69 mg/dL (ref 0.60–1.30)
EGFR (Non-African Amer.): >60
Total Protein: 7.8 g/dL (ref 6.4–8.2)

## 2013-04-30 LAB — TROPONIN I: Troponin-I: <0.02 ng/mL

## 2013-05-01 LAB — URINALYSIS, COMPLETE
Bacteria: NONE SEEN
Nitrite: NEGATIVE
Ph: 7 (ref 4.5–8.0)
RBC,UR: 3 /HPF (ref 0–5)
Squamous Epithelial: <1

## 2013-05-02 ENCOUNTER — Telehealth: Payer: Self-pay | Admitting: Gastroenterology

## 2013-05-02 NOTE — Telephone Encounter (Signed)
Returned call and pt was not at home. Requested pts husband let her know I had called.

## 2013-05-04 NOTE — Telephone Encounter (Signed)
Pt wanted to know if she could be seen prior to January appt. Offered pt an appt for tomorrow with [facility] but she states she cannot come that day. Pt states she will call back to scheduled an appt with [facility].

## 2013-05-25 ENCOUNTER — Other Ambulatory Visit: Payer: Self-pay | Admitting: Family Medicine

## 2013-06-07 ENCOUNTER — Ambulatory Visit (INDEPENDENT_AMBULATORY_CARE_PROVIDER_SITE_OTHER): Payer: Medicare Other | Admitting: Gastroenterology

## 2013-06-07 ENCOUNTER — Encounter: Payer: Self-pay | Admitting: Gastroenterology

## 2013-06-07 VITALS — BP 118/64 | HR 68 | Ht 63.5 in | Wt 168.8 lb

## 2013-06-07 DIAGNOSIS — K219 Gastro-esophageal reflux disease without esophagitis: Secondary | ICD-10-CM

## 2013-06-07 DIAGNOSIS — K5900 Constipation, unspecified: Secondary | ICD-10-CM

## 2013-06-07 MED ORDER — DEXLANSOPRAZOLE 60 MG PO CPDR: 60.0000 mg | DELAYED_RELEASE_CAPSULE | Freq: Every day | ORAL | Status: DC

## 2013-06-07 NOTE — Patient Instructions (Signed)
Per Dr Elbert Ewings You have been instructed to stop your  Prevacid and Zantac . And to begin taking Dexilant twice a day for  2 weeks . Then 1 time  A day  Samples are given  Contact office  To let Dr Elbert Ewings know how you doing

## 2013-06-07 NOTE — Assessment & Plan Note (Signed)
On a regimen of Linzess 290 micrograms daily plus occasional MiraLax she is moving her bowels every other day to every third day.  Plan to continue with the same regimen.

## 2013-06-07 NOTE — Assessment & Plan Note (Signed)
Patient is symptomatic despite her current regimen of medications.  Recommendations #1 trial of dexilant before breakfast and dinner.  If improved after 2 weeks she was instructed to lower it to every morning #2 antireflux measures

## 2013-06-07 NOTE — Progress Notes (Signed)
          History of Present Illness:  The patient has returned for evaluation of reflux.  Despite taking Prevacid q. a.m. and ranitidine at night time she's having frequent breakthrough pyrosis.  She initially was taking ibuprofen at night but she discontinued this.  She may have symptoms anytime during the day or night.  She denies dysphagia.  Last colonoscopy was within the last 4-5 years and apparently was normal.  She has no GI complaints.    Review of Systems: Pertinent positive and negative review of systems were noted in the above HPI section. All other review of systems were otherwise negative.    Current Medications, Allergies, Past Medical History, Past Surgical History, Family History and Social History were reviewed in [facility] record  Vital signs were reviewed in today's medical record. Physical Exam: General: Well developed , well nourished, no acute distress   See Assessment and Plan under Problem List

## 2013-06-16 ENCOUNTER — Encounter: Payer: Self-pay | Admitting: Family Medicine

## 2013-06-16 ENCOUNTER — Ambulatory Visit (INDEPENDENT_AMBULATORY_CARE_PROVIDER_SITE_OTHER): Payer: Medicare Other | Admitting: Family Medicine

## 2013-06-16 VITALS — BP 128/66 | HR 68 | Temp 97.3°F | Ht 62.75 in | Wt 166.2 lb

## 2013-06-16 DIAGNOSIS — M25519 Pain in unspecified shoulder: Secondary | ICD-10-CM | POA: Diagnosis not present

## 2013-06-16 DIAGNOSIS — R5383 Other fatigue: Principal | ICD-10-CM

## 2013-06-16 DIAGNOSIS — R5381 Other malaise: Secondary | ICD-10-CM | POA: Diagnosis not present

## 2013-06-16 DIAGNOSIS — E559 Vitamin D deficiency, unspecified: Secondary | ICD-10-CM | POA: Diagnosis not present

## 2013-06-16 DIAGNOSIS — M25511 Pain in right shoulder: Secondary | ICD-10-CM

## 2013-06-16 LAB — CBC WITH DIFFERENTIAL/PLATELET
Basophils Absolute: 0 10*3/uL (ref 0.0–0.1)
Basophils Relative: 0.3 % (ref 0.0–3.0)
EOS PCT: 3.6 % (ref 0.0–5.0)
Eosinophils Absolute: 0.2 10*3/uL (ref 0.0–0.7)
HCT: 35 % — ABNORMAL LOW (ref 36.0–46.0)
HEMOGLOBIN: 11.9 g/dL — AB (ref 12.0–15.0)
Lymphocytes Relative: 14.4 % (ref 12.0–46.0)
Lymphs Abs: 0.8 10*3/uL (ref 0.7–4.0)
MCHC: 34 g/dL (ref 30.0–36.0)
MCV: 94.3 fl (ref 78.0–100.0)
MONOS PCT: 11 % (ref 3.0–12.0)
Monocytes Absolute: 0.6 10*3/uL (ref 0.1–1.0)
Neutro Abs: 4 10*3/uL (ref 1.4–7.7)
Neutrophils Relative %: 70.7 % (ref 43.0–77.0)
PLATELETS: 210 10*3/uL (ref 150.0–400.0)
RBC: 3.71 Mil/uL — ABNORMAL LOW (ref 3.87–5.11)
RDW: 13.9 % (ref 11.5–14.6)
WBC: 5.6 10*3/uL (ref 4.5–10.5)

## 2013-06-16 LAB — COMPREHENSIVE METABOLIC PANEL
ALBUMIN: 4.1 g/dL (ref 3.5–5.2)
ALK PHOS: 60 U/L (ref 39–117)
ALT: 26 U/L (ref 0–35)
AST: 22 U/L (ref 0–37)
BUN: 24 mg/dL — ABNORMAL HIGH (ref 6–23)
CALCIUM: 9.1 mg/dL (ref 8.4–10.5)
CHLORIDE: 102 meq/L (ref 96–112)
CO2: 30 mEq/L (ref 19–32)
Creatinine, Ser: 0.9 mg/dL (ref 0.4–1.2)
GFR: 61.74 mL/min (ref >60.00)
GLUCOSE: 75 mg/dL (ref 70–99)
Potassium: 4.4 mEq/L (ref 3.5–5.1)
SODIUM: 140 meq/L (ref 135–145)
Total Bilirubin: 0.7 mg/dL (ref 0.3–1.2)
Total Protein: 6.9 g/dL (ref 6.0–8.3)

## 2013-06-16 LAB — TSH: TSH: 1.2 u[IU]/mL (ref 0.35–5.50)

## 2013-06-16 LAB — T4, FREE: FREE T4: 0.67 ng/dL (ref 0.60–1.60)

## 2013-06-16 LAB — VITAMIN B12: Vitamin B-12: 569 pg/mL (ref 211–911)

## 2013-06-16 NOTE — Patient Instructions (Signed)
Great to see you. I will call you with your lab results.  Don't use the bands but keep staying active with that shoulder.

## 2013-06-16 NOTE — Assessment & Plan Note (Signed)
Likely multifactorial.  No red flag symptoms on exam or history. Will check labs today. Orders Placed This Encounter  Procedures  . TSH  . T4, Free  . CBC with Differential  . Vitamin B12  . Vitamin D, 25-hydroxy  . Comprehensive metabolic panel

## 2013-06-16 NOTE — Progress Notes (Signed)
Pre-visit discussion using our clinic review tool. No additional management support is needed unless otherwise documented below in the visit note.  

## 2013-06-16 NOTE — Progress Notes (Signed)
Subjective:    Patient ID: Jamie Matthews, female    DOB: 1941/03/25, 73 y.o.   MRN: [id number]  HPI  Very pleasant 73 yo female here for months of fatigue.  Sleeping well at night, especially now that she is taking Dexilant.  Has noticed that if she is in church or sitting in a lecture, she has been falling asleep. No CP, SOB, night sweats or LE edema.  Appetite good. Weight stable. Wt Readings from Last 3 Encounters:  06/16/13 166 lb 4 oz (75.411 kg)  06/07/13 168 lb 12.8 oz (76.567 kg)  03/24/13 160 lb 4 oz (72.689 kg)    No other symptoms other than some right shoulder discomfort when she uses the resistance band during exercise class.  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  Patient Active Problem List   Diagnosis Date Noted  . Other malaise and fatigue 06/16/2013  . Right shoulder pain 06/16/2013  . Tick bite of abdomen 01/19/2013  . DNR (do not resuscitate) 11/15/2012  . Constipation 04/01/2012  . 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 Urinary incontinence   . Depression   . GERD (gastroesophageal reflux disease)   . Diverticulitis   . Hyperlipidemia    Past Medical History  Diagnosis Date  . Urinary incontinence   . Depression   . GERD (gastroesophageal reflux disease)   . Diverticulitis   . Hyperlipidemia   . Fibromyalgia   . Pneumonia 11/18/12   Past Surgical History  Procedure Laterality Date  . Cholecystectomy    . Tonsillectomy    . Bladder suspension  2004, 2012   History  Substance Use Topics  . Smoking status: Never Smoker   . Smokeless tobacco: Never Used  . Alcohol Use: No   Family History  Problem Relation Age of Onset  . Cancer Father   . Diabetes Father   . Heart disease Father   . Heart disease Mother    Allergies  Allergen Reactions  . Lipitor [Atorvastatin]     Itching   . Sulfa Antibiotics Rash   Current Outpatient Prescriptions on File Prior to Visit  Medication Sig Dispense Refill  . albuterol (PROVENTIL  HFA;VENTOLIN HFA) 108 (90 BASE) MCG/ACT inhaler Inhale 2 puffs into the lungs every 6 (six) hours as needed for wheezing.  1 Inhaler  0  . atorvastatin (LIPITOR) 20 MG tablet TAKE 1 TABLET EVERY DAY  90 tablet  0  . BIOTIN 5000 PO Take by mouth.      . Ciclopirox 1 % shampoo USE AS DIRECTED  120 mL  0  . dexlansoprazole (DEXILANT) 60 MG capsule Take 1 capsule (60 mg total) by mouth daily.  30 capsule  3  . diphenhydrAMINE (BENADRYL) 25 MG tablet Take by mouth as directed      . Linaclotide (LINZESS) 290 MCG CAPS capsule Take 1 capsule (290 mcg total) by mouth daily.  30 capsule  3  . MAGNESIUM PO Take by mouth.      . memantine (NAMENDA) 10 MG tablet TAKE ONE TABLET TWICE A DAY  60 tablet  5  . mirabegron ER (MYRBETRIQ) 50 MG TB24 Take 1 tablet (50 mg total) by mouth daily.  30 tablet  6  . Multiple Vitamins-Minerals (CENTRUM PO) Take one by mouth daily      . pramoxine-hydrocortisone (ANALPRAM-HC) 1-1 % rectal cream Place rectally 2 (two) times daily.  30 g  0  . triamcinolone (NASACORT) 55 MCG/ACT nasal inhaler Place 2 sprays into the  nose daily.      [name] venlafaxine XR (EFFEXOR-XR) 150 MG 24 hr capsule TAKE ONE CAPSULE DAILY  30 capsule  2   No current facility-administered medications on file prior to visit.   The PMH, PSH, Social History, Family History, Medications, and allergies have been reviewed in [facility], and have been updated if relevant.  Review of Systems  Constitutional: Positive for fatigue. Negative for fever, diaphoresis, activity change, appetite change and unexpected weight change.  Respiratory: Negative for chest tightness and shortness of breath.   Psychiatric/Behavioral: Negative for sleep disturbance.   See HPI     Objective:   Physical Exam  Constitutional: She appears well-developed and well-nourished. No distress.  HENT:  Head: Normocephalic.  Eyes: Pupils are equal, round, and reactive to light.  Cardiovascular: Normal rate, regular rhythm and normal heart  sounds.   Pulmonary/Chest: Effort normal and breath sounds normal.  Abdominal: Soft. Bowel sounds are normal.  Musculoskeletal:  FROM of shoulders bilaterally No tenderness to palpation, neg empty can test bilaterally, normal strength bilaterally   Psychiatric: She has a normal mood and affect. Her speech is normal and behavior is normal. Judgment and thought content normal. Cognition and memory are normal.          Assessment & Plan:

## 2013-06-16 NOTE — Assessment & Plan Note (Signed)
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 Advised not to use bands but stay active. If symptoms persist, consider PT and or imaging. The patient indicates understanding of these issues and agrees with the plan.

## 2013-06-17 LAB — VITAMIN D 25 HYDROXY (VIT D DEFICIENCY, FRACTURES): Vit D, 25-Hydroxy: 41 ng/mL (ref 30–89)

## 2013-06-22 ENCOUNTER — Telehealth: Payer: Self-pay | Admitting: Gastroenterology

## 2013-06-22 DIAGNOSIS — K219 Gastro-esophageal reflux disease without esophagitis: Secondary | ICD-10-CM

## 2013-06-22 DIAGNOSIS — K5900 Constipation, unspecified: Secondary | ICD-10-CM

## 2013-06-23 MED ORDER — DEXLANSOPRAZOLE 60 MG PO CPDR: 60.0000 mg | DELAYED_RELEASE_CAPSULE | Freq: Two times a day (BID) | ORAL | Status: DC

## 2013-06-23 NOTE — Telephone Encounter (Signed)
Medication sent to [facility]

## 2013-07-05 DIAGNOSIS — L219 Seborrheic dermatitis, unspecified: Secondary | ICD-10-CM | POA: Diagnosis not present

## 2013-07-05 DIAGNOSIS — L820 Inflamed seborrheic keratosis: Secondary | ICD-10-CM | POA: Diagnosis not present

## 2013-07-05 DIAGNOSIS — L821 Other seborrheic keratosis: Secondary | ICD-10-CM | POA: Diagnosis not present

## 2013-07-08 ENCOUNTER — Encounter: Payer: Self-pay | Admitting: Family Medicine

## 2013-07-08 ENCOUNTER — Ambulatory Visit (INDEPENDENT_AMBULATORY_CARE_PROVIDER_SITE_OTHER): Payer: Medicare Other | Admitting: Family Medicine

## 2013-07-08 VITALS — BP 100/70 | HR 59 | Temp 97.9°F | Ht 62.75 in | Wt 164.8 lb

## 2013-07-08 DIAGNOSIS — K219 Gastro-esophageal reflux disease without esophagitis: Secondary | ICD-10-CM | POA: Diagnosis not present

## 2013-07-08 DIAGNOSIS — R1110 Vomiting, unspecified: Secondary | ICD-10-CM | POA: Diagnosis not present

## 2013-07-08 MED ORDER — HYDROCORTISONE ACE-PRAMOXINE 1-1 % RE CREA: TOPICAL_CREAM | Freq: Two times a day (BID) | RECTAL | Status: DC

## 2013-07-08 NOTE — Assessment & Plan Note (Signed)
No sign of infection. Most likely cause of emesis/reflux  flare is being off Dexilant.  She has now restarted. Reviewed foods and meds to avoid. She has been having citris and spaghetti sauce lately.

## 2013-07-08 NOTE — Progress Notes (Signed)
Subjective:    Patient ID: Jamie Matthews, female    DOB: 05-19-1941, 73 y.o.   MRN: [id number]  Gastrophageal Reflux She complains of coughing. She reports no abdominal pain or no chest pain. Pertinent negatives include no fatigue.  Emesis  Associated symptoms include coughing. Pertinent negatives include no abdominal pain, chest pain or fever.  Cough Pertinent negatives include no chest pain, ear pain, fever or shortness of breath.    73 year old female pt of Dr. Lance Coon history of high cholesterol, depression and GERD presents with recurrent N/V, heratburn, gas and acid reflux into mouth. 03/18/2013 went to ER for persistent N/V. Had been using ibuprofen frequently.  She has a second episode 1 month later. Sees Dr. Deatra Ina, started on dexilant BID on 1/13 for GERD.    Her symptoms improved until 2 days ago Wednesday she has a recurrent episode of fatigue, and nausea and one emesis.  She had been out of dexilant for 1 week prior to recurrent episode.  She was able to keep down popsicle down and fluids. No further emesis in 2 days. Bland diet. No fever, mid abdominal discomfort, no pain. No blood in emesis or stool.  Using linzess for constipaton.. Stopped a few days ago given stool softer, more frequent.  She has also had a cough, clear mucus. Some nasal congestion.      Review of Systems  Constitutional: Negative for fever and fatigue.  HENT: Negative for ear pain.   Eyes: Negative for pain.  Respiratory: Positive for cough. Negative for chest tightness and shortness of breath.   Cardiovascular: Negative for chest pain, palpitations and leg swelling.  Gastrointestinal: Positive for vomiting. Negative for abdominal pain.  Genitourinary: Negative for dysuria.       Objective:   Physical Exam  Constitutional: Vital signs are normal. She appears well-developed and well-nourished. She is cooperative.  Non-toxic appearance. She does not appear ill. No distress.    HENT:  Head: Normocephalic.  Right Ear: Hearing, tympanic membrane, external ear and ear canal normal. Tympanic membrane is not erythematous, not retracted and not bulging.  Left Ear: Hearing, tympanic membrane, external ear and ear canal normal. Tympanic membrane is not erythematous, not retracted and not bulging.  Nose: No mucosal edema or rhinorrhea. Right sinus exhibits no maxillary sinus tenderness and no frontal sinus tenderness. Left sinus exhibits no maxillary sinus tenderness and no frontal sinus tenderness.  Mouth/Throat: Uvula is midline, oropharynx is clear and moist and mucous membranes are normal.  Eyes: Conjunctivae, EOM and lids are normal. Pupils are equal, round, and reactive to light. Lids are everted and swept, no foreign bodies found.  Neck: Trachea normal and normal range of motion. Neck supple. Carotid bruit is not present. No mass and no thyromegaly present.  Cardiovascular: Normal rate, regular rhythm, S1 normal, S2 normal, normal heart sounds, intact distal pulses and normal pulses.  Exam reveals no gallop and no friction rub.   No murmur heard. Pulmonary/Chest: Effort normal and breath sounds normal. Not tachypneic. No respiratory distress. She has no decreased breath sounds. She has no wheezes. She has no rhonchi. She has no rales.  Abdominal: Soft. Normal appearance and bowel sounds are normal. There is tenderness in the epigastric area.  Mi;ld discomfort not pain  Neurological: She is alert.  Skin: Skin is warm, dry and intact. No rash noted.  Psychiatric: Her speech is normal and behavior is normal. Judgment and thought content normal. Her mood appears not anxious. Cognition and memory  are normal. She does not exhibit a depressed mood.          Assessment & Plan:

## 2013-07-08 NOTE — Assessment & Plan Note (Signed)
Now resolved back on dexilant. No sign of viral or bacterial infection.  Likely due to GERD.  No dehydration.

## 2013-07-08 NOTE — Progress Notes (Signed)
Pre-visit discussion using our clinic review tool. No additional management support is needed unless otherwise documented below in the visit note.  

## 2013-07-08 NOTE — Patient Instructions (Addendum)
Continue on the dexilant twice daily. Avoid triggers of reflux such spicy, chocolate, alcohol, caffeine, soda,citris, tomato. Restart linzess if constipation returns. Avoid ibuprofen, aspirin and aleve. Use tylenol.

## 2013-07-19 ENCOUNTER — Other Ambulatory Visit: Payer: Self-pay | Admitting: Family Medicine

## 2013-08-16 ENCOUNTER — Telehealth: Payer: Self-pay

## 2013-08-16 DIAGNOSIS — D649 Anemia, unspecified: Secondary | ICD-10-CM

## 2013-08-16 NOTE — Telephone Encounter (Signed)
Pt left v/m; wanting to f/u on diagnosis of anemia from lab work done earlier this year; pt wants to know if she needs to be on any medication or what should be the next step;unable to reach pt by phone for further info ie; blood in stools.Please advise.

## 2013-08-16 NOTE — Telephone Encounter (Signed)
Anemia is mild at this point.  I would like to repeat CBC.  Please schedule lab visit.

## 2013-08-17 NOTE — Telephone Encounter (Signed)
Lm on pts vm requesting a call back 

## 2013-08-18 NOTE — Telephone Encounter (Signed)
Lm on pts vm requesting a call back 

## 2013-08-19 NOTE — Telephone Encounter (Signed)
Lm on pts vm requesting a call back 

## 2013-08-23 ENCOUNTER — Encounter: Payer: Self-pay | Admitting: *Deleted

## 2013-08-23 NOTE — Telephone Encounter (Signed)
Lm on pts vm requesting a call back. Multiple attempts made to contact pt; letter mailed requesting pt call and schedule f/u blood work and orders entered

## 2013-09-06 ENCOUNTER — Other Ambulatory Visit: Payer: Self-pay | Admitting: Family Medicine

## 2013-09-06 NOTE — Telephone Encounter (Signed)
Pt requesting medication refill. Last ov-acute 05/2013 with no future appts scheduled. pls advise

## 2013-09-07 ENCOUNTER — Other Ambulatory Visit (INDEPENDENT_AMBULATORY_CARE_PROVIDER_SITE_OTHER): Payer: Medicare Other

## 2013-09-07 DIAGNOSIS — D649 Anemia, unspecified: Secondary | ICD-10-CM

## 2013-09-07 LAB — CBC WITH DIFFERENTIAL/PLATELET
Basophils Absolute: 0 10*3/uL (ref 0.0–0.1)
Basophils Relative: 0.2 % (ref 0.0–3.0)
EOS ABS: 0.2 10*3/uL (ref 0.0–0.7)
EOS PCT: 4 % (ref 0.0–5.0)
HCT: 35.5 % — ABNORMAL LOW (ref 36.0–46.0)
Hemoglobin: 12.1 g/dL (ref 12.0–15.0)
Lymphocytes Relative: 13.7 % (ref 12.0–46.0)
Lymphs Abs: 0.7 10*3/uL (ref 0.7–4.0)
MCHC: 34.2 g/dL (ref 30.0–36.0)
MCV: 95.1 fl (ref 78.0–100.0)
MONO ABS: 0.7 10*3/uL (ref 0.1–1.0)
Monocytes Relative: 14.3 % — ABNORMAL HIGH (ref 3.0–12.0)
NEUTROS PCT: 67.8 % (ref 43.0–77.0)
Neutro Abs: 3.3 10*3/uL (ref 1.4–7.7)
Platelets: 218 10*3/uL (ref 150.0–400.0)
RBC: 3.73 Mil/uL — ABNORMAL LOW (ref 3.87–5.11)
RDW: 13.6 % (ref 11.5–14.6)
WBC: 4.9 10*3/uL (ref 4.5–10.5)

## 2013-09-07 NOTE — Telephone Encounter (Signed)
Ok to refill 

## 2013-09-20 ENCOUNTER — Ambulatory Visit (INDEPENDENT_AMBULATORY_CARE_PROVIDER_SITE_OTHER): Payer: Medicare Other | Admitting: Family Medicine

## 2013-09-20 ENCOUNTER — Encounter: Payer: Self-pay | Admitting: Family Medicine

## 2013-09-20 ENCOUNTER — Telehealth: Payer: Self-pay

## 2013-09-20 ENCOUNTER — Ambulatory Visit: Payer: Medicare Other | Admitting: Family Medicine

## 2013-09-20 VITALS — BP 128/74 | HR 61 | Temp 97.7°F | Ht 63.25 in | Wt 162.0 lb

## 2013-09-20 DIAGNOSIS — F3289 Other specified depressive episodes: Secondary | ICD-10-CM

## 2013-09-20 DIAGNOSIS — M2550 Pain in unspecified joint: Secondary | ICD-10-CM

## 2013-09-20 DIAGNOSIS — G4700 Insomnia, unspecified: Secondary | ICD-10-CM

## 2013-09-20 DIAGNOSIS — F32A Depression, unspecified: Secondary | ICD-10-CM

## 2013-09-20 DIAGNOSIS — L259 Unspecified contact dermatitis, unspecified cause: Secondary | ICD-10-CM | POA: Diagnosis not present

## 2013-09-20 DIAGNOSIS — F5104 Psychophysiologic insomnia: Secondary | ICD-10-CM | POA: Insufficient documentation

## 2013-09-20 DIAGNOSIS — F329 Major depressive disorder, single episode, unspecified: Secondary | ICD-10-CM | POA: Diagnosis not present

## 2013-09-20 DIAGNOSIS — L239 Allergic contact dermatitis, unspecified cause: Secondary | ICD-10-CM | POA: Insufficient documentation

## 2013-09-20 MED ORDER — BUPROPION HCL ER (XL) 150 MG PO TB24: 150.0000 mg | ORAL_TABLET | Freq: Every day | ORAL | Status: DC

## 2013-09-20 NOTE — Progress Notes (Signed)
Subjective:   Patient ID: Jamie Matthews, female    DOB: Apr 28, 1941, 73 y.o.   MRN: [id number]  Oceane Fosse Abbe [place] is a pleasant 73 y.o. year old female who presents to clinic today with Follow-up and Arthritis  on 09/20/2013  HPI: Here to discuss several issues.  Depression- has been more depressed lately.  Less energy, more anhedonia. No SI or HI. Forces herself to do things.  Not sleeping well. On Effexor 150 mg XR daily. Appetite down but has been intentionally losing weight- on weight watchers. Wt Readings from Last 3 Encounters:  09/20/13 162 lb (73.483 kg)  07/08/13 164 lb 12 oz (74.73 kg)  06/16/13 166 lb 4 oz (75.411 kg)   Itchy rash- noticed it after she started swimming.  No changes in laundry detergent or soap.  Patient Active Problem List   Diagnosis Date Noted  . Insomnia 09/20/2013  . Allergic dermatitis 09/20/2013  . Emesis 07/08/2013  . Other malaise and fatigue 06/16/2013  . Right shoulder pain 06/16/2013  . Tick bite of abdomen 01/19/2013  . DNR (do not resuscitate) 11/15/2012  . Constipation 04/01/2012  . 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 Urinary incontinence   . Depression   . GERD (gastroesophageal reflux disease)   . Diverticulitis   . Hyperlipidemia    Past Medical History  Diagnosis Date  . Urinary incontinence   . Depression   . GERD (gastroesophageal reflux disease)   . Diverticulitis   . Hyperlipidemia   . Fibromyalgia   . Pneumonia 11/18/12   Past Surgical History  Procedure Laterality Date  . Cholecystectomy    . Tonsillectomy    . Bladder suspension  2004, 2012   History  Substance Use Topics  . Smoking status: Never Smoker   . Smokeless tobacco: Never Used  . Alcohol Use: No   Family History  Problem Relation Age of Onset  . Cancer Father   . Diabetes Father   . Heart disease Father   . Heart disease Mother    Allergies  Allergen Reactions  . Lipitor [Atorvastatin]     Itching   . Sulfa Antibiotics Rash   Current  Outpatient Prescriptions on File Prior to Visit  Medication Sig Dispense Refill  . albuterol (PROVENTIL HFA;VENTOLIN HFA) 108 (90 BASE) MCG/ACT inhaler Inhale 2 puffs into the lungs every 6 (six) hours as needed for wheezing.  1 Inhaler  0  . atorvastatin (LIPITOR) 20 MG tablet TAKE 1 TABLET EVERY DAY  90 tablet  0  . BIOTIN 5000 PO Take by mouth.      . Ciclopirox 1 % shampoo USE AS DIRECTED  120 mL  0  . dexlansoprazole (DEXILANT) 60 MG capsule Take 1 capsule (60 mg total) by mouth 2 (two) times daily.  60 capsule  3  . diphenhydrAMINE (BENADRYL) 25 MG tablet Take 50 mg by mouth every morning.       . hydrocortisone-pramoxine (ANALPRAM-HC) 2.5-1 % rectal cream APPLY SPARINGLY TO AFFECTED AREAS THREE TO FOUR TIMES A DAY AS NEEDED  30 g  0  . Linaclotide (LINZESS) 290 MCG CAPS capsule Take 1 capsule (290 mcg total) by mouth daily.  30 capsule  3  . MAGNESIUM PO Take by mouth.      . memantine (NAMENDA) 10 MG tablet TAKE ONE TABLET TWICE A DAY  60 tablet  5  . Multiple Vitamins-Minerals (CENTRUM PO) Take one by mouth daily      . MYRBETRIQ 50 MG TB24 tablet TAKE 1  TABLET EVERY DAY  30 tablet  5  . pramoxine-hydrocortisone (ANALPRAM-HC) 1-1 % rectal cream Place rectally 2 (two) times daily.  30 g  0  . triamcinolone (NASACORT) 55 MCG/ACT nasal inhaler Place 2 sprays into the nose daily.      [name] venlafaxine XR (EFFEXOR-XR) 150 MG 24 hr capsule TAKE ONE CAPSULE DAILY  30 capsule  3   No current facility-administered medications on file prior to visit.   The PMH, PSH, Social History, Family History, Medications, and allergies have been reviewed in [facility], and have been updated if relevant.   Review of Systems See HPI    Objective:    BP 128/74  Pulse 61  Temp(Src) 97.7 F (36.5 C) (Oral)  Ht 5' 3.25" (1.607 m)  Wt 162 lb (73.483 kg)  BMI 28.45 kg/m2  SpO2 96%   Physical Exam  Nursing note and vitals reviewed. Constitutional: She appears well-developed and well-nourished. No distress.    Skin: Skin is warm and dry. Rash noted.  Urticaria on chest, arms, and stomach  Psychiatric: She has a normal mood and affect. Her behavior is normal. Judgment and thought content normal.          Assessment & Plan:   Depression  Insomnia  Allergic dermatitis Return in about 3 weeks (around 10/11/2013) for medication follow up.[name]

## 2013-09-20 NOTE — Telephone Encounter (Signed)
Referral placed.

## 2013-09-20 NOTE — Assessment & Plan Note (Signed)
Likely related to worsening depression. Will hopefully improve with Wellbutrin. Advised Benadryl at bedtime.

## 2013-09-20 NOTE — Patient Instructions (Addendum)
Great to see you. Please start taking Benadryl at bedtime for itching and sleep.  We are starting Wellbutrin 150 mg XL daily.

## 2013-09-20 NOTE — Assessment & Plan Note (Signed)
?  chemicals in pool.  Advised showering as soon as she gets out of pool.

## 2013-09-20 NOTE — Telephone Encounter (Signed)
Pt saw rheumatologist x 1 before pt moved here from [place]; pt request rheumatology referral to Dr Precious Reel at [facility] for neck shoulder and hip pain.pt request cb.

## 2013-09-20 NOTE — Progress Notes (Signed)
Pre visit review using our clinic review tool, if applicable. No additional management support is needed unless otherwise documented below in the visit note. 

## 2013-09-20 NOTE — Assessment & Plan Note (Signed)
Deteriorated. She is not interested in psychotherapy at this time. Already on high dose effexor. Will add Wellbutrin 150 mg XL daily. Follow up in 3 weeks.

## 2013-09-26 DIAGNOSIS — M898X9 Other specified disorders of bone, unspecified site: Secondary | ICD-10-CM | POA: Diagnosis not present

## 2013-10-04 DIAGNOSIS — L259 Unspecified contact dermatitis, unspecified cause: Secondary | ICD-10-CM | POA: Diagnosis not present

## 2013-10-04 DIAGNOSIS — L821 Other seborrheic keratosis: Secondary | ICD-10-CM | POA: Diagnosis not present

## 2013-10-04 DIAGNOSIS — R21 Rash and other nonspecific skin eruption: Secondary | ICD-10-CM | POA: Diagnosis not present

## 2013-10-04 DIAGNOSIS — L820 Inflamed seborrheic keratosis: Secondary | ICD-10-CM | POA: Diagnosis not present

## 2013-10-04 DIAGNOSIS — D1800 Hemangioma unspecified site: Secondary | ICD-10-CM | POA: Diagnosis not present

## 2013-10-04 DIAGNOSIS — L723 Sebaceous cyst: Secondary | ICD-10-CM | POA: Diagnosis not present

## 2013-10-04 DIAGNOSIS — L819 Disorder of pigmentation, unspecified: Secondary | ICD-10-CM | POA: Diagnosis not present

## 2013-10-13 ENCOUNTER — Encounter: Payer: Self-pay | Admitting: Family Medicine

## 2013-10-13 ENCOUNTER — Ambulatory Visit (INDEPENDENT_AMBULATORY_CARE_PROVIDER_SITE_OTHER): Payer: Medicare Other | Admitting: Family Medicine

## 2013-10-13 VITALS — BP 126/70 | HR 67 | Temp 98.0°F | Wt 162.5 lb

## 2013-10-13 DIAGNOSIS — F3289 Other specified depressive episodes: Secondary | ICD-10-CM | POA: Diagnosis not present

## 2013-10-13 DIAGNOSIS — F32A Depression, unspecified: Secondary | ICD-10-CM

## 2013-10-13 DIAGNOSIS — K219 Gastro-esophageal reflux disease without esophagitis: Secondary | ICD-10-CM | POA: Diagnosis not present

## 2013-10-13 DIAGNOSIS — F329 Major depressive disorder, single episode, unspecified: Secondary | ICD-10-CM

## 2013-10-13 MED ORDER — RANITIDINE HCL 150 MG PO CAPS: 150.0000 mg | ORAL_CAPSULE | Freq: Two times a day (BID) | ORAL | Status: DC

## 2013-10-13 NOTE — Patient Instructions (Signed)
Good to see you. We are adding Ranitidine 150 mg twice daily to the dexilant you are taking.  Call me in 2 weeks with an update.

## 2013-10-13 NOTE — Progress Notes (Signed)
Pre visit review using our clinic review tool, if applicable. No additional management support is needed unless otherwise documented below in the visit note. 

## 2013-10-13 NOTE — Assessment & Plan Note (Signed)
Improved. Continue current dose of Wellbutrin.

## 2013-10-13 NOTE — Assessment & Plan Note (Signed)
Deteriorated. Add H2 blocker- eRx sent for ranitidine 150 mg twice daily. If no improvement 2 weeks, she will let me know and we will refer back to GI for their suggestions. The patient indicates understanding of these issues and agrees with the plan.

## 2013-10-13 NOTE — Progress Notes (Signed)
Subjective:   Patient ID: Jamie Matthews, female    DOB: 03-03-1941, 73 y.o.   MRN: [id number]  Arnetia Bronk Abbe [place] is a pleasant 73 y.o. year old female who presents to clinic today with Gastrophageal Reflux  on 10/13/2013  HPI: Followed by Dr. Deatra Ina (GI). He did her endscopy on 12/02/12- reviewed again today. Did have dilation done of esophagus and polyps noted, otherwise unremarkable.  Did see Dr. Diona Browner in 06/2013 for worsening reflux symptoms but had stopped dexliant (prescribed by Dr Deatra Ina) and symptoms improved once she restart it.  Now feels it is not working as well- more belching, feeling bloats.  No nausea.  Does not wake her up at night.  Last saw Dr. Deatra Ina on 06/07/2013- note reviewed:   Patient is symptomatic despite her current regimen of medications.  Recommendations  #1 trial of dexilant before breakfast and dinner. If improved after 2 weeks she was instructed to lower it to every morning  #2 antireflux measures   Depression- started Wellbutrin last month.  Feels it is helping on most days.  Still has some days when she is more fatigued.  Patient Active Problem List   Diagnosis Date Noted  . Insomnia 09/20/2013  . Allergic dermatitis 09/20/2013  . Emesis 07/08/2013  . Other malaise and fatigue 06/16/2013  . Right shoulder pain 06/16/2013  . Tick bite of abdomen 01/19/2013  . DNR (do not resuscitate) 11/15/2012  . Constipation 04/01/2012  . 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 Urinary incontinence   . Depression   . GERD (gastroesophageal reflux disease)   . Diverticulitis   . Hyperlipidemia    Past Medical History  Diagnosis Date  . Urinary incontinence   . Depression   . GERD (gastroesophageal reflux disease)   . Diverticulitis   . Hyperlipidemia   . Fibromyalgia   . Pneumonia 11/18/12   Past Surgical History  Procedure Laterality Date  . Cholecystectomy    . Tonsillectomy    . Bladder suspension  2004, 2012   History  Substance Use Topics  .  Smoking status: Never Smoker   . Smokeless tobacco: Never Used  . Alcohol Use: No   Family History  Problem Relation Age of Onset  . Cancer Father   . Diabetes Father   . Heart disease Father   . Heart disease Mother    Allergies  Allergen Reactions  . Lipitor [Atorvastatin]     Itching   . Sulfa Antibiotics Rash   Current Outpatient Prescriptions on File Prior to Visit  Medication Sig Dispense Refill  . albuterol (PROVENTIL HFA;VENTOLIN HFA) 108 (90 BASE) MCG/ACT inhaler Inhale 2 puffs into the lungs every 6 (six) hours as needed for wheezing.  1 Inhaler  0  . atorvastatin (LIPITOR) 20 MG tablet TAKE 1 TABLET EVERY DAY  90 tablet  0  . BIOTIN 5000 PO Take by mouth.      [name] buPROPion (WELLBUTRIN XL) 150 MG 24 hr tablet Take 1 tablet (150 mg total) by mouth daily.  30 tablet  3  . Ciclopirox 1 % shampoo USE AS DIRECTED  120 mL  0  . dexlansoprazole (DEXILANT) 60 MG capsule Take 1 capsule (60 mg total) by mouth 2 (two) times daily.  60 capsule  3  . diphenhydrAMINE (BENADRYL) 25 MG tablet Take 50 mg by mouth every morning.       . hydrocortisone-pramoxine (ANALPRAM-HC) 2.5-1 % rectal cream APPLY SPARINGLY TO AFFECTED AREAS THREE TO FOUR TIMES A DAY AS NEEDED  30 g  0  . Linaclotide (LINZESS) 290 MCG CAPS capsule Take 1 capsule (290 mcg total) by mouth daily.  30 capsule  3  . MAGNESIUM PO Take by mouth.      . memantine (NAMENDA) 10 MG tablet TAKE ONE TABLET TWICE A DAY  60 tablet  5  . Multiple Vitamins-Minerals (CENTRUM PO) Take one by mouth daily      . MYRBETRIQ 50 MG TB24 tablet TAKE 1 TABLET EVERY DAY  30 tablet  5  . pramoxine-hydrocortisone (ANALPRAM-HC) 1-1 % rectal cream Place rectally 2 (two) times daily.  30 g  0  . triamcinolone (NASACORT) 55 MCG/ACT nasal inhaler Place 2 sprays into the nose daily.      [name] venlafaxine XR (EFFEXOR-XR) 150 MG 24 hr capsule TAKE ONE CAPSULE DAILY  30 capsule  3   No current facility-administered medications on file prior to visit.   The  PMH, PSH, Social History, Family History, Medications, and allergies have been reviewed in [facility], and have been updated if relevant.     Review of Systems See HPI No dysphagia No vomiting No bloody stools Appetite good- weight stable Wt Readings from Last 3 Encounters:  10/13/13 162 lb 8 oz (73.71 kg)  09/20/13 162 lb (73.483 kg)  07/08/13 164 lb 12 oz (74.73 kg)       Objective:    BP 126/70  Pulse 67  Temp(Src) 98 F (36.7 C) (Oral)  Wt 162 lb 8 oz (73.71 kg)  SpO2 96%   Physical Exam  Nursing note and vitals reviewed. Constitutional: She is oriented to person, place, and time. She appears well-developed and well-nourished. No distress.  Neurological: She is alert and oriented to person, place, and time. No cranial nerve deficit.  Skin: Skin is warm and dry.  Psychiatric: She has a normal mood and affect. Her behavior is normal. Judgment and thought content normal.          Assessment & Plan:   GERD (gastroesophageal reflux disease)  Depression No Follow-up on file.

## 2013-10-22 ENCOUNTER — Other Ambulatory Visit: Payer: Self-pay | Admitting: Gastroenterology

## 2013-10-26 ENCOUNTER — Encounter: Payer: Self-pay | Admitting: Gastroenterology

## 2013-10-26 ENCOUNTER — Ambulatory Visit (INDEPENDENT_AMBULATORY_CARE_PROVIDER_SITE_OTHER): Payer: Medicare Other | Admitting: Gastroenterology

## 2013-10-26 VITALS — BP 100/68 | HR 76 | Ht 63.0 in | Wt 160.5 lb

## 2013-10-26 DIAGNOSIS — R1310 Dysphagia, unspecified: Secondary | ICD-10-CM | POA: Diagnosis not present

## 2013-10-26 DIAGNOSIS — K219 Gastro-esophageal reflux disease without esophagitis: Secondary | ICD-10-CM

## 2013-10-26 DIAGNOSIS — R112 Nausea with vomiting, unspecified: Secondary | ICD-10-CM

## 2013-10-26 MED ORDER — NA SULFATE-K SULFATE-MG SULF 17.5-3.13-1.6 GM/177ML PO SOLN: 1.0000 | Freq: Once | ORAL | Status: DC

## 2013-10-26 MED ORDER — HYOSCYAMINE SULFATE ER 0.375 MG PO TBCR: EXTENDED_RELEASE_TABLET | ORAL | Status: DC

## 2013-10-26 NOTE — Assessment & Plan Note (Signed)
Resolved following esophageal dilatation.

## 2013-10-26 NOTE — Assessment & Plan Note (Signed)
Although the patient attributes her symptoms to reflux, I don't think this is operative.  Plan to continue dexilant

## 2013-10-26 NOTE — Progress Notes (Signed)
          History of Present Illness:  The patient has returned complaining of postprandial lower crampy abdominal pain, belching and nausea.  Constipation remains a problem.  Symptoms have been a progressive problem over the years.  She denies pyrosis, per se.  There is no history of rectal bleeding.  By the patient's report colonoscopy at least 5 years ago was negative.  She remains on Linzess.  Dysphagia has subsided since she underwent dilation of the esophagus.    Review of Systems: The patient complains of depression.  She stopped the Wellbutrin because of excess sleepiness.  Pertinent positive and negative review of systems were noted in the above HPI section. All other review of systems were otherwise negative.    Current Medications, Allergies, Past Medical History, Past Surgical History, Family History and Social History were reviewed in [facility] record  Vital signs were reviewed in today's medical record. Physical Exam: General: Well developed , well nourished, no acute distress Skin: anicteric Head: Normocephalic and atraumatic Eyes:  sclerae anicteric, EOMI Ears: Normal auditory acuity Mouth: No deformity or lesions Lungs: Clear throughout to auscultation Heart: Regular rate and rhythm; no murmurs, rubs or bruits Abdomen: Soft, non tender and non distended. No masses, hepatosplenomegaly or hernias noted. Normal Bowel sounds Rectal:deferred Musculoskeletal: Symmetrical with no gross deformities  Pulses:  Normal pulses noted Extremities: No clubbing, cyanosis, edema or deformities noted Neurological: Alert oriented x 4, grossly nonfocal Psychological:  Alert and cooperative. Normal mood and affect  See Assessment and Plan under Problem List

## 2013-10-26 NOTE — Assessment & Plan Note (Signed)
Patient has persistent postprandial crampy lower abdominal pain, excess belching, nausea and constipation.  Symptoms could be do to partial obstruction, either in the lower upper GI tract.  Recommendations #1 colonoscopy #2 trial of hyomax #3 to consider CT of the abdomen and pelvis pending results of above

## 2013-10-26 NOTE — Patient Instructions (Signed)
You have been scheduled for a colonoscopy with propofol. Please follow written instructions given to you at your visit today.  Please pick up your prep kit at the pharmacy within the next 1-3 days. If you use inhalers (even only as needed), please bring them with you on the day of your procedure. Your physician has requested that you go to www.startemmi.com and enter the access code given to you at your visit today. This web site gives a general overview about your procedure. However, you should still follow specific instructions given to you by our office regarding your preparation for the procedure  Purchase 2 bottle of Mag citrate for extra day of colonoscopy preping

## 2013-10-28 ENCOUNTER — Ambulatory Visit (INDEPENDENT_AMBULATORY_CARE_PROVIDER_SITE_OTHER): Payer: Medicare Other | Admitting: Family Medicine

## 2013-10-28 ENCOUNTER — Encounter: Payer: Self-pay | Admitting: Family Medicine

## 2013-10-28 VITALS — BP 128/78 | HR 63 | Temp 97.9°F | Wt 163.5 lb

## 2013-10-28 DIAGNOSIS — K5900 Constipation, unspecified: Secondary | ICD-10-CM | POA: Diagnosis not present

## 2013-10-28 DIAGNOSIS — K219 Gastro-esophageal reflux disease without esophagitis: Secondary | ICD-10-CM

## 2013-10-28 DIAGNOSIS — E785 Hyperlipidemia, unspecified: Secondary | ICD-10-CM | POA: Diagnosis not present

## 2013-10-28 NOTE — Progress Notes (Signed)
Pre visit review using our clinic review tool, if applicable. No additional management support is needed unless otherwise documented below in the visit note. 

## 2013-10-28 NOTE — Progress Notes (Signed)
Subjective:   Patient ID: Jamie Matthews, female    DOB: 08/30/1940, 73 y.o.   MRN: [id number]  Jamie Matthews is a pleasant 73 y.o. year old female who presents to clinic today with Follow-up  on 10/28/2013  HPI: Followed by Dr. Deatra Ina (GI).  Saw him on 6/3- note reviewed. Recommended continuing [place]. Also taking raniditine.  Advised hyoscyamine and colonoscopy- she does not want to do either one.  Feeling better now.  Wants to cancel colonoscopy and for me to take Zantac off her list.  He did her endscopy on 12/02/12- reviewed again today. Did have dilation done of esophagus and polyps noted, otherwise unremarkable. Dysphagia resolved after dilatation.  HLD-wants to stop taking her cholesterol medication.  No bad side effects but she is worried it will worsen her memory. Lab Results  Component Value Date   CHOL 136 11/15/2012   HDL 41.90 11/15/2012   LDLCALC 82 11/15/2012   LDLDIRECT 175.9 02/05/2012   TRIG 63.0 11/15/2012   CHOLHDL 3 11/15/2012       Patient Active Problem List   Diagnosis Date Noted  . Insomnia 09/20/2013  . Allergic dermatitis 09/20/2013  . Emesis 07/08/2013  . Other malaise and fatigue 06/16/2013  . Right shoulder pain 06/16/2013  . Tick bite of abdomen 01/19/2013  . DNR (do not resuscitate) 11/15/2012  . Constipation 04/01/2012  . 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 Urinary incontinence   . Depression   . GERD (gastroesophageal reflux disease)   . Diverticulitis   . Hyperlipidemia    Past Medical History  Diagnosis Date  . Urinary incontinence   . Depression   . GERD (gastroesophageal reflux disease)   . Diverticulitis   . Hyperlipidemia   . Fibromyalgia   . Pneumonia 11/18/12   Past Surgical History  Procedure Laterality Date  . Cholecystectomy    . Tonsillectomy    . Bladder suspension  2004, 2012   History  Substance Use Topics  . Smoking status: Never Smoker   . Smokeless tobacco: Never Used  . Alcohol Use: No   Family  History  Problem Relation Age of Onset  . Cancer Father   . Diabetes Father   . Heart disease Father   . Heart disease Mother    Allergies  Allergen Reactions  . Lipitor [Atorvastatin]     Itching   . Sulfa Antibiotics Rash   Current Outpatient Prescriptions on File Prior to Visit  Medication Sig Dispense Refill  . atorvastatin (LIPITOR) 20 MG tablet TAKE 1 TABLET EVERY DAY  90 tablet  0  . BIOTIN 5000 PO Take by mouth.      . Calcium Carbonate Antacid (ANTACID PO) Take by mouth as needed.      . Ciclopirox 1 % shampoo USE AS DIRECTED  120 mL  0  . DEXILANT 60 MG capsule TAKE ONE CAPSULE TWICE A DAY  60 capsule  11  . diphenhydrAMINE (BENADRYL) 25 MG tablet Take 50 mg by mouth daily.       . Linaclotide (LINZESS) 290 MCG CAPS capsule Take 1 capsule (290 mcg total) by mouth daily.  30 capsule  3  . MAGNESIUM PO Take by mouth.      . memantine (NAMENDA) 10 MG tablet TAKE ONE TABLET TWICE A DAY  60 tablet  5  . Multiple Vitamins-Minerals (CENTRUM PO) Take one by mouth daily      . MYRBETRIQ 50 MG TB24 tablet TAKE 1 TABLET EVERY DAY  30 tablet  5  . Phenylephrine-Acetaminophen (TYLENOL SINUS CONGESTION/PAIN) 5-325 MG TABS Take 2 tablets by mouth as needed.      . polyethylene glycol powder (GLYCOLAX/MIRALAX) powder Take 17 g by mouth as needed.      . pramoxine-hydrocortisone (ANALPRAM-HC) 1-1 % rectal cream Place rectally 2 (two) times daily.  30 g  0  . Propylene Glycol (SYSTANE BALANCE OP) Apply to eye as needed.      . ranitidine (ZANTAC) 150 MG capsule Take 1 capsule (150 mg total) by mouth 2 (two) times daily.  60 capsule  3  . triamcinolone (NASACORT) 55 MCG/ACT nasal inhaler Place 2 sprays into the nose daily.      [name] venlafaxine XR (EFFEXOR-XR) 150 MG 24 hr capsule TAKE ONE CAPSULE DAILY  30 capsule  3   No current facility-administered medications on file prior to visit.   The PMH, PSH, Social History, Family History, Medications, and allergies have been reviewed in [facility], and  have been updated if relevant.     Review of Systems See HPI No dysphagia No vomiting No bloody stools Appetite good- weight stable Wt Readings from Last 3 Encounters:  10/28/13 163 lb 8 oz (74.163 kg)  10/26/13 160 lb 8 oz (72.802 kg)  10/13/13 162 lb 8 oz (73.71 kg)       Objective:    BP 128/78  Pulse 63  Temp(Src) 97.9 F (36.6 C) (Oral)  Wt 163 lb 8 oz (74.163 kg)  SpO2 96%   Physical Exam  Nursing note and vitals reviewed. Constitutional: She is oriented to person, place, and time. She appears well-developed and well-nourished. No distress.  Neurological: She is alert and oriented to person, place, and time. No cranial nerve deficit.  Skin: Skin is warm and dry.  Psychiatric: She has a normal mood and affect. Her behavior is normal. Judgment and thought content normal.          Assessment & Plan:   GERD (gastroesophageal reflux disease)  Constipation  Hyperlipidemia No Follow-up on file.

## 2013-10-28 NOTE — Assessment & Plan Note (Signed)
Improved with dexlilant and Zantac.

## 2013-10-28 NOTE — Assessment & Plan Note (Signed)
Wants to stop linzess if she can. Wants to try miralax and something more natural and less expensive. I suggested power pudding.  >25 minutes spent in face to face time with patient, >50% spent in counselling or coordination of care in regards to constipation, GERD and HLD.

## 2013-10-28 NOTE — Patient Instructions (Addendum)
Ok to try to stop taking Linzess. Try Power Pudding -look up recipe online.  Keep me updated.  Dr.  Zenovia Jarred Stop taking your lipitor.  Come see me in 1 month and we will recheck your cholesterol at your physical.

## 2013-10-28 NOTE — Assessment & Plan Note (Signed)
Ok to hold lipitor. Recheck lipids next month prior to CPX. The patient indicates understanding of these issues and agrees with the plan.

## 2013-11-05 ENCOUNTER — Other Ambulatory Visit: Payer: Self-pay | Admitting: Family Medicine

## 2013-11-08 ENCOUNTER — Encounter: Payer: Medicare Other | Admitting: Gastroenterology

## 2013-11-15 DIAGNOSIS — L820 Inflamed seborrheic keratosis: Secondary | ICD-10-CM | POA: Diagnosis not present

## 2013-11-15 DIAGNOSIS — L821 Other seborrheic keratosis: Secondary | ICD-10-CM | POA: Diagnosis not present

## 2013-11-15 DIAGNOSIS — L723 Sebaceous cyst: Secondary | ICD-10-CM | POA: Diagnosis not present

## 2013-11-15 DIAGNOSIS — L259 Unspecified contact dermatitis, unspecified cause: Secondary | ICD-10-CM | POA: Diagnosis not present

## 2013-11-24 ENCOUNTER — Ambulatory Visit: Payer: Medicare Other | Admitting: Family Medicine

## 2013-12-13 ENCOUNTER — Encounter: Payer: Self-pay | Admitting: Family Medicine

## 2013-12-13 ENCOUNTER — Telehealth: Payer: Self-pay

## 2013-12-13 ENCOUNTER — Ambulatory Visit (INDEPENDENT_AMBULATORY_CARE_PROVIDER_SITE_OTHER): Payer: Medicare Other | Admitting: Family Medicine

## 2013-12-13 VITALS — BP 116/66 | HR 69 | Temp 98.1°F | Ht 63.25 in | Wt 158.5 lb

## 2013-12-13 DIAGNOSIS — N3941 Urge incontinence: Secondary | ICD-10-CM

## 2013-12-13 DIAGNOSIS — F32A Depression, unspecified: Secondary | ICD-10-CM

## 2013-12-13 DIAGNOSIS — Z66 Do not resuscitate: Secondary | ICD-10-CM

## 2013-12-13 DIAGNOSIS — E785 Hyperlipidemia, unspecified: Secondary | ICD-10-CM

## 2013-12-13 DIAGNOSIS — F329 Major depressive disorder, single episode, unspecified: Secondary | ICD-10-CM

## 2013-12-13 DIAGNOSIS — Z1231 Encounter for screening mammogram for malignant neoplasm of breast: Secondary | ICD-10-CM

## 2013-12-13 DIAGNOSIS — Z23 Encounter for immunization: Secondary | ICD-10-CM | POA: Diagnosis not present

## 2013-12-13 DIAGNOSIS — Z0000 Encounter for general adult medical examination without abnormal findings: Secondary | ICD-10-CM | POA: Diagnosis not present

## 2013-12-13 DIAGNOSIS — Z780 Asymptomatic menopausal state: Secondary | ICD-10-CM

## 2013-12-13 LAB — POCT URINALYSIS DIPSTICK
Bilirubin, UA: NEGATIVE
Glucose, UA: NEGATIVE
Ketones, UA: NEGATIVE
Nitrite, UA: NEGATIVE
PH UA: 7.5
Spec Grav, UA: <=1.005
UROBILINOGEN UA: 0.2

## 2013-12-13 LAB — COMPREHENSIVE METABOLIC PANEL
ALT: 20 U/L (ref 0–35)
AST: 22 U/L (ref 0–37)
Albumin: 4.3 g/dL (ref 3.5–5.2)
Alkaline Phosphatase: 59 U/L (ref 39–117)
BUN: 19 mg/dL (ref 6–23)
CHLORIDE: 99 meq/L (ref 96–112)
CO2: 32 mEq/L (ref 19–32)
Calcium: 9.5 mg/dL (ref 8.4–10.5)
Creatinine, Ser: 0.9 mg/dL (ref 0.4–1.2)
GFR: 65.67 mL/min (ref >60.00)
Glucose, Bld: 92 mg/dL (ref 70–99)
Potassium: 4.5 mEq/L (ref 3.5–5.1)
Sodium: 137 mEq/L (ref 135–145)
TOTAL PROTEIN: 6.8 g/dL (ref 6.0–8.3)
Total Bilirubin: 0.8 mg/dL (ref 0.2–1.2)

## 2013-12-13 LAB — LIPID PANEL
CHOLESTEROL: 200 mg/dL (ref 0–200)
HDL: 44 mg/dL (ref >39.00)
LDL Cholesterol: 132 mg/dL — ABNORMAL HIGH (ref 0–99)
NONHDL: 156
Total CHOL/HDL Ratio: 5
Triglycerides: 122 mg/dL (ref 0.0–149.0)
VLDL: 24.4 mg/dL (ref 0.0–40.0)

## 2013-12-13 LAB — TSH: TSH: 2.95 u[IU]/mL (ref 0.35–4.50)

## 2013-12-13 NOTE — Patient Instructions (Addendum)
Check with your insurance to see if they will cover the shingles shot.  I will call you with your lab and urine culture results.

## 2013-12-13 NOTE — Progress Notes (Signed)
Subjective:   Patient ID: Jamie Matthews, female    DOB: 1940-11-01, 73 y.o.   MRN: [id number]  Jamie Matthews is a pleasant 73 y.o. year old female who presents to clinic today with No chief complaint on file.  on 12/13/2013  HPI: I have personally reviewed the Medicare Annual Wellness questionnaire and have noted 1. The patient's medical and social history 2. Their use of alcohol, tobacco or illicit drugs 3. Their current medications and supplements 4. The patient's functional ability including ADL's, fall risks, home safety risks and hearing or visual             impairment. 5. Diet and physical activities 6. Evidence for depression or mood disorders  Colonoscopy 2 years- Dr. Selinda Eon in [place]. Has never had zostavax.  End of life wishes discussed and updated in Social History.  The roster of all physicians providing medical care to patient - is listed in the Snapshot section of the chart.  Mammogram 12/15/12  HLD- stopped taking lipitor 20 mg daily.  She is concerned about it affecting her memory- she read an article about this. Lab Results  Component Value Date   CHOL 136 11/15/2012   HDL 41.90 11/15/2012   LDLCALC 82 11/15/2012   LDLDIRECT 175.9 02/05/2012   TRIG 63.0 11/15/2012   CHOLHDL 3 11/15/2012   Lab Results  Component Value Date   ALT 26 06/16/2013   AST 22 06/16/2013   ALKPHOS 60 06/16/2013   BILITOT 0.7 06/16/2013   Urinary incontinence.- has been doing well with Myrbetriq but last few days, more issues with incontinence. No dysuria.  Current Outpatient Prescriptions on File Prior to Visit  Medication Sig Dispense Refill  . atorvastatin (LIPITOR) 20 MG tablet TAKE 1 TABLET EVERY DAY  90 tablet  0  . BIOTIN 5000 PO Take by mouth.      . Calcium Carbonate Antacid (ANTACID PO) Take by mouth as needed.      . Ciclopirox 1 % shampoo USE AS DIRECTED  120 mL  0  . DEXILANT 60 MG capsule TAKE ONE CAPSULE TWICE A DAY  60 capsule  11  . diphenhydrAMINE  (BENADRYL) 25 MG tablet Take 50 mg by mouth daily.       . Linaclotide (LINZESS) 290 MCG CAPS capsule Take 1 capsule (290 mcg total) by mouth daily.  30 capsule  3  . MAGNESIUM PO Take by mouth.      . Multiple Vitamins-Minerals (CENTRUM PO) Take one by mouth daily      . MYRBETRIQ 50 MG TB24 tablet TAKE 1 TABLET EVERY DAY  30 tablet  5  . NAMENDA 10 MG tablet TAKE ONE TABLET TWICE A DAY  60 tablet  1  . Phenylephrine-Acetaminophen (TYLENOL SINUS CONGESTION/PAIN) 5-325 MG TABS Take 2 tablets by mouth as needed.      . polyethylene glycol powder (GLYCOLAX/MIRALAX) powder Take 17 g by mouth as needed.      . pramoxine-hydrocortisone (ANALPRAM-HC) 1-1 % rectal cream Place rectally 2 (two) times daily.  30 g  0  . Propylene Glycol (SYSTANE BALANCE OP) Apply to eye as needed.      . ranitidine (ZANTAC) 150 MG capsule Take 1 capsule (150 mg total) by mouth 2 (two) times daily.  60 capsule  3  . triamcinolone (NASACORT) 55 MCG/ACT nasal inhaler Place 2 sprays into the nose daily.      [name] venlafaxine XR (EFFEXOR-XR) 150 MG 24 hr capsule TAKE ONE CAPSULE DAILY  30 capsule  3   No current facility-administered medications on file prior to visit.    Allergies  Allergen Reactions  . Lipitor [Atorvastatin]     Itching   . Sulfa Antibiotics Rash    Past Medical History  Diagnosis Date  . Urinary incontinence   . Depression   . GERD (gastroesophageal reflux disease)   . Diverticulitis   . Hyperlipidemia   . Fibromyalgia   . Pneumonia 11/18/12    Past Surgical History  Procedure Laterality Date  . Cholecystectomy    . Tonsillectomy    . Bladder suspension  2004, 2012    Family History  Problem Relation Age of Onset  . Cancer Father   . Diabetes Father   . Heart disease Father   . Heart disease Mother     History   Social History  . Marital Status: Married    Spouse Name: N/A    Number of Children: N/A  . Years of Education: N/A   Occupational History  . Retired    Social  History Main Topics  . Smoking status: Never Smoker   . Smokeless tobacco: Never Used  . Alcohol Use: No  . Drug Use: No  . Sexual Activity: Not on file   Other Topics Concern  . Not on file   Social History Narrative   Recently moved with her husband to [place] from [place].   Husband is a retired Pharmacist, community.   No children.   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   The PMH, PSH, Social History, Family History, Medications, and allergies have been reviewed in [facility], and have been updated if relevant.   Review of Systems See HPI No CP or SOB Denies any anxiety or depression Weight is down- intentional- attending weight watchers meetings Wt Readings from Last 3 Encounters:  12/13/13 158 lb 8 oz (71.895 kg)  10/28/13 163 lb 8 oz (74.163 kg)  10/26/13 160 lb 8 oz (72.802 kg)   No blood in stool No abdominal pain No fatigue No diarrhea or constipation- stopped taking linzess due to cost and has noticed no difference yet.    Objective:    BP 116/66  Pulse 69  Temp(Src) 98.1 F (36.7 C) (Oral)  Ht 5' 3.25" (1.607 m)  Wt 158 lb 8 oz (71.895 kg)  BMI 27.84 kg/m2  SpO2 97%  Wt Readings from Last 3 Encounters:  12/13/13 158 lb 8 oz (71.895 kg)  10/28/13 163 lb 8 oz (74.163 kg)  10/26/13 160 lb 8 oz (72.802 kg)     Physical Exam  Nursing note and vitals reviewed. Constitutional: She is oriented to person, place, and time. She appears well-developed and well-nourished. No distress.  HENT:  Head: Normocephalic and atraumatic.  Eyes: Pupils are equal, round, and reactive to light.  Neck: Normal range of motion. Neck supple.  Cardiovascular: Normal rate and regular rhythm.   No murmur heard. Pulmonary/Chest: Effort normal and breath sounds normal. No respiratory distress. She has no wheezes.  Abdominal: Soft. Bowel sounds are normal. There is no rebound and no guarding.  Musculoskeletal: Normal range of motion.  Neurological: She is alert and oriented to  person, place, and time. No cranial nerve deficit.  Skin: Skin is warm and dry.  Psychiatric: She has a normal mood and affect. Her behavior is normal. Judgment and thought content normal.          Assessment & Plan:   Medicare  annual wellness visit, subsequent  DNR (do not resuscitate)  Depression  Hyperlipidemia No Follow-up on file.

## 2013-12-13 NOTE — Progress Notes (Signed)
Pre visit review using our clinic review tool, if applicable. No additional management support is needed unless otherwise documented below in the visit note. 

## 2013-12-13 NOTE — Assessment & Plan Note (Signed)
Stable on current rx. No changes. 

## 2013-12-13 NOTE — Assessment & Plan Note (Signed)
The patients weight, height, BMI and visual acuity have been recorded in the chart I have made referrals, counseling and provided education to the patient based review of the above and I have provided the pt with a written personalized care plan for preventive services.  Prevnar 13 today. Mammogram and DEXA ordered.

## 2013-12-13 NOTE — Telephone Encounter (Signed)
Pt was seen today and wanted to know the name of the injection pt received today; advised Prevnar 13. I asked if I could answer any further questions and pt said no the name was all she needed.

## 2013-12-13 NOTE — Assessment & Plan Note (Signed)
Stopped taking her statin. Recheck labs today.

## 2013-12-13 NOTE — Assessment & Plan Note (Signed)
UA pos for trace RBC and leukocytes only. No new rx for now- send urine for cx. Continue myrbetriq.

## 2013-12-14 LAB — URINE CULTURE
Colony Count: NO GROWTH
Organism ID, Bacteria: NO GROWTH

## 2013-12-19 ENCOUNTER — Encounter: Payer: Self-pay | Admitting: *Deleted

## 2014-01-10 ENCOUNTER — Other Ambulatory Visit: Payer: Self-pay | Admitting: Family Medicine

## 2014-01-19 ENCOUNTER — Telehealth: Payer: Self-pay | Admitting: Family Medicine

## 2014-01-19 NOTE — Telephone Encounter (Signed)
Pt called to question the name of the GI MD that Dr Deborra Medina referred her to on her previous visit. I did not see any note in chart. Patient requesting call back with info.

## 2014-01-20 NOTE — Telephone Encounter (Signed)
Spoke to pts husband and lm on pts vm informing her referral to GI was for Dr Deatra Ina

## 2014-01-24 ENCOUNTER — Other Ambulatory Visit: Payer: Self-pay | Admitting: Family Medicine

## 2014-01-24 ENCOUNTER — Ambulatory Visit: Payer: Self-pay | Admitting: Family Medicine

## 2014-01-24 DIAGNOSIS — Z1231 Encounter for screening mammogram for malignant neoplasm of breast: Secondary | ICD-10-CM | POA: Diagnosis not present

## 2014-01-24 DIAGNOSIS — R928 Other abnormal and inconclusive findings on diagnostic imaging of breast: Secondary | ICD-10-CM | POA: Diagnosis not present

## 2014-01-25 ENCOUNTER — Encounter: Payer: Self-pay | Admitting: Family Medicine

## 2014-02-02 ENCOUNTER — Encounter: Payer: Self-pay | Admitting: Family Medicine

## 2014-02-02 ENCOUNTER — Ambulatory Visit: Payer: Self-pay | Admitting: Family Medicine

## 2014-02-02 DIAGNOSIS — R928 Other abnormal and inconclusive findings on diagnostic imaging of breast: Secondary | ICD-10-CM | POA: Diagnosis not present

## 2014-02-06 DIAGNOSIS — M25569 Pain in unspecified knee: Secondary | ICD-10-CM | POA: Diagnosis not present

## 2014-02-06 DIAGNOSIS — M159 Polyosteoarthritis, unspecified: Secondary | ICD-10-CM | POA: Diagnosis not present

## 2014-02-13 DIAGNOSIS — Z23 Encounter for immunization: Secondary | ICD-10-CM | POA: Diagnosis not present

## 2014-03-02 ENCOUNTER — Other Ambulatory Visit: Payer: Self-pay | Admitting: Family Medicine

## 2014-03-28 ENCOUNTER — Other Ambulatory Visit: Payer: Self-pay | Admitting: Family Medicine

## 2014-04-06 ENCOUNTER — Other Ambulatory Visit: Payer: Self-pay | Admitting: Family Medicine

## 2014-04-21 ENCOUNTER — Other Ambulatory Visit: Payer: Self-pay | Admitting: Gastroenterology

## 2014-04-28 ENCOUNTER — Encounter: Payer: Self-pay | Admitting: Family Medicine

## 2014-05-12 ENCOUNTER — Other Ambulatory Visit: Payer: Self-pay | Admitting: Family Medicine

## 2014-05-24 ENCOUNTER — Telehealth: Payer: Self-pay

## 2014-05-24 NOTE — Telephone Encounter (Signed)
PLEASE NOTE: All timestamps contained within this report are represented as [place] Standard Time. CONFIDENTIALTY NOTICE: This fax transmission is intended only for the addressee. It contains information that is legally privileged, confidential or otherwise protected from use or disclosure. If you are not the intended recipient, you are strictly prohibited from reviewing, disclosing, copying using or disseminating any of this information or taking any action in reliance on or regarding this information. If you have received this fax in error, please notify us immediately by telephone so that we can arrange for its return to [place]. Phone: [phone], Toll-Free: [phone], Fax: [phone] Page: 1 of 2 Call Id: [id number] [place] Patient Name: Jamie Matthews EN Gender: Female DOB: 09/02/1941 Age: 73 Y 12 M 21 D Return Phone Number: [phone] (Primary) Address: [address] City/State/Zip: [place] [address] Client Lamar Day - Client Client Site [place] Physician Deborra Medina, [place] Type Call Call Type Triage / Clinical Caller Name Cindra Presume [facility] Relationship To Patient Spouse Return Phone Number [phone] (Primary) Chief Complaint Dizziness Initial Comment Caller states, wife has vertigo last night, she fell down once PreDisposition Go to Urgent Care/Walk-In Clinic Nurse Assessment Nurse: Donalynn Furlong, RN, Myna Hidalgo Date/Time Eilene Ghazi Time): 05/24/2014 12:05:14 PM Confirm and document reason for call. If symptomatic, describe symptoms. ---Caller states, wife has vertigo last night, she fell down once . Takes Benedryl 25 mg po q hs. Has the patient traveled out of the country within the last 30 days? ---No Does the patient require triage? ---Yes Related visit to physician within the last 2 weeks? ---No Does the  PT have any chronic conditions? (i.e. diabetes, asthma, etc.) ---Yes List chronic conditions. ---overactive bladder Guidelines Guideline Title Affirmed Question Affirmed Notes Nurse Date/Time (Eastern Time) Dizziness - Lightheadedness [1] MILD dizziness (e.g., walking normally) AND [2] has NOT been evaluated by physician for this (Exception: dizziness caused by heat exposure, sudden standing, or poor fluid intake) Donalynn Furlong, RN, Myna Hidalgo 05/24/2014 12:10:38 PM Disp. Time Eilene Ghazi Time) Disposition Final User 05/24/2014 12:15:13 PM See PCP When Office is Open (within 3 days) Yes Donalynn Furlong, RN, Myna Hidalgo PLEASE NOTE: All timestamps contained within this report are represented as [place] Standard Time. CONFIDENTIALTY NOTICE: This fax transmission is intended only for the addressee. It contains information that is legally privileged, confidential or otherwise protected from use or disclosure. If you are not the intended recipient, you are strictly prohibited from reviewing, disclosing, copying using or disseminating any of this information or taking any action in reliance on or regarding this information. If you have received this fax in error, please notify us immediately by telephone so that we can arrange for its return to [place]. Phone: [phone], Toll-Free: [phone], Fax: [phone] Page: 2 of 2 Call Id: [id number] Caller Understands: Yes Disagree/Comply: Comply Care Advice Given Per Guideline SEE PCP WITHIN 3 DAYS: You need to be examined within 2 or 3 days. Call your doctor during regular office hours and make an appointment. (Note: if office will be open tomorrow, tell caller to call then, not in 3 days). FLUIDS: Drink several glasses of fruit juice, other clear fluids or water. This will improve hydration and blood glucose. If the weather is hot, make sure the fluids are cold. REST: Lie down with feet elevated for 1 hour. This will improve circulation and increase blood flow  to the brain. FALL PREVENTION: *  Sit at side of bed for several minutes before standing up. Stand up slowly. * Avoid sudden movements of head. CALL BACK IF: * Passes out (faints) * You become worse. CARE ADVICE given per Dizziness (Adult) guideline. After Care Instructions Given Call Event Type User Date / Time Description

## 2014-06-09 ENCOUNTER — Ambulatory Visit (INDEPENDENT_AMBULATORY_CARE_PROVIDER_SITE_OTHER): Payer: Medicare Other | Admitting: Family Medicine

## 2014-06-09 ENCOUNTER — Encounter: Payer: Self-pay | Admitting: Family Medicine

## 2014-06-09 VITALS — BP 100/60 | HR 62 | Temp 98.0°F | Ht 63.25 in | Wt 161.2 lb

## 2014-06-09 DIAGNOSIS — G4700 Insomnia, unspecified: Secondary | ICD-10-CM | POA: Diagnosis not present

## 2014-06-09 DIAGNOSIS — K5900 Constipation, unspecified: Secondary | ICD-10-CM

## 2014-06-09 DIAGNOSIS — F5104 Psychophysiologic insomnia: Secondary | ICD-10-CM

## 2014-06-09 DIAGNOSIS — K5909 Other constipation: Secondary | ICD-10-CM

## 2014-06-09 DIAGNOSIS — F331 Major depressive disorder, recurrent, moderate: Secondary | ICD-10-CM | POA: Diagnosis not present

## 2014-06-09 MED ORDER — TRAZODONE HCL 50 MG PO TABS: 25.0000 mg | ORAL_TABLET | Freq: Every evening | ORAL | Status: DC | PRN

## 2014-06-09 NOTE — Assessment & Plan Note (Signed)
Inadequate control. Offered counseling. She will consider.  Continue high dose venlafaxine. Treat insomnia as below.

## 2014-06-09 NOTE — Patient Instructions (Addendum)
Get back to regular exercise, increase fiber and water. Restart miralax daily.  Can also try milk of magnesia or if still no response try an enema.  Continue linzess.  Stop benadryl as may be  worsening constipaiton  Start trial of trazodone for insomnia.   Follow up in 2-4 weeks with Dr.B or A .

## 2014-06-09 NOTE — Progress Notes (Signed)
   Subjective:    Patient ID: Myrene Buddy, female    DOB: 14-Feb-1941, 74 y.o.   MRN: [id number]  HPI 74 year old female pt of Dr. Hulen Shouts with history of constipation, GERD, insomnia, memory loss and depression presents with multiple complaints.   She  Has had  worsening of  chronic constipation. Last BM 2 days ago, small amount, dry and hard. She has been on linzess, was moderate helpful in past but still requires miralax. She has not been taking miralax given she has had her husband with health issues. She has been having increase in reflux.  Symptoms worse in last 1-2 weeks. She is on dexilant and zantac for reflux. She has been nauseous and has thrown up 3 times.   She has been able to keep down water.   She has had a lot of stress with husbands health issues.  She has been having trouble sleeping, can't stop thinking about things.  She has been more  sad, tearful in last few weeks.  She is on venlafaxine 150 mg  She does take 1-2 benadryl at night. Review of Systems  Constitutional: Negative for fever and fatigue.  HENT: Negative for ear pain.   Eyes: Negative for pain.  Respiratory: Negative for chest tightness and shortness of breath.   Cardiovascular: Negative for chest pain, palpitations and leg swelling.  Gastrointestinal: Negative for abdominal pain.  Genitourinary: Negative for dysuria.       Objective:   Physical Exam  Constitutional: Vital signs are normal. She appears well-developed and well-nourished. She is cooperative.  Non-toxic appearance. She does not appear ill. No distress.  HENT:  Head: Normocephalic.  Right Ear: Hearing, tympanic membrane, external ear and ear canal normal. Tympanic membrane is not erythematous, not retracted and not bulging.  Left Ear: Hearing, tympanic membrane, external ear and ear canal normal. Tympanic membrane is not erythematous, not retracted and not bulging.  Nose: No mucosal edema or rhinorrhea. Right sinus exhibits no  maxillary sinus tenderness and no frontal sinus tenderness. Left sinus exhibits no maxillary sinus tenderness and no frontal sinus tenderness.  Mouth/Throat: Uvula is midline, oropharynx is clear and moist and mucous membranes are normal.  Eyes: Conjunctivae, EOM and lids are normal. Pupils are equal, round, and reactive to light. Lids are everted and swept, no foreign bodies found.  Neck: Trachea normal and normal range of motion. Neck supple. Carotid bruit is not present. No thyroid mass and no thyromegaly present.  Cardiovascular: Normal rate, regular rhythm, S1 normal, S2 normal, normal heart sounds, intact distal pulses and normal pulses.  Exam reveals no gallop and no friction rub.   No murmur heard. Pulmonary/Chest: Effort normal and breath sounds normal. No tachypnea. No respiratory distress. She has no decreased breath sounds. She has no wheezes. She has no rhonchi. She has no rales.  Abdominal: Soft. Normal appearance and bowel sounds are normal. There is generalized tenderness.  Mild abdominal soreness  Neurological: She is alert.  Skin: Skin is warm, dry and intact. No rash noted.  Psychiatric: Her speech is normal and behavior is normal. Judgment and thought content normal. Her mood appears not anxious. Cognition and memory are normal. She does not exhibit a depressed mood.          Assessment & Plan:

## 2014-06-09 NOTE — Assessment & Plan Note (Signed)
Stop benadryl. Trial of trazodone.

## 2014-06-09 NOTE — Assessment & Plan Note (Signed)
In adequate control. Stop benadryl as may be exacerbating issue.  Get back to water, fiber and exercise in addition to linzess. Restart miralax, but can acutely use enema or MOM for treatment.

## 2014-06-09 NOTE — Progress Notes (Signed)
Pre visit review using our clinic review tool, if applicable. No additional management support is needed unless otherwise documented below in the visit note. 

## 2014-06-10 DIAGNOSIS — Z9049 Acquired absence of other specified parts of digestive tract: Secondary | ICD-10-CM | POA: Diagnosis not present

## 2014-06-10 DIAGNOSIS — K9289 Other specified diseases of the digestive system: Secondary | ICD-10-CM | POA: Diagnosis not present

## 2014-06-10 LAB — COMPREHENSIVE METABOLIC PANEL
AST: 32 U/L (ref 15–37)
Albumin: 4.1 g/dL (ref 3.4–5.0)
Alkaline Phosphatase: 92 U/L
Anion Gap: 8 (ref 7–16)
BUN: 20 mg/dL — ABNORMAL HIGH (ref 7–18)
Bilirubin,Total: 0.6 mg/dL (ref 0.2–1.0)
CALCIUM: 9.1 mg/dL (ref 8.5–10.1)
CHLORIDE: 103 mmol/L (ref 98–107)
Co2: 29 mmol/L (ref 21–32)
Creatinine: 0.87 mg/dL (ref 0.60–1.30)
EGFR (Non-African Amer.): >60
GLUCOSE: 131 mg/dL — AB (ref 65–99)
Osmolality: 284 (ref 275–301)
Potassium: 4.1 mmol/L (ref 3.5–5.1)
SGPT (ALT): 37 U/L
Sodium: 140 mmol/L (ref 136–145)
Total Protein: 7.6 g/dL (ref 6.4–8.2)

## 2014-06-10 LAB — URINALYSIS, COMPLETE
Bilirubin,UR: NEGATIVE
Blood: NEGATIVE
Glucose,UR: NEGATIVE mg/dL (ref 0–75)
NITRITE: NEGATIVE
Ph: 5 (ref 4.5–8.0)
RBC,UR: 5 /HPF (ref 0–5)
SPECIFIC GRAVITY: 1.027 (ref 1.003–1.030)
Squamous Epithelial: 1

## 2014-06-10 LAB — CBC
HCT: 41.5 % (ref 35.0–47.0)
HGB: 13.7 g/dL (ref 12.0–16.0)
MCH: 32 pg (ref 26.0–34.0)
MCHC: 33.1 g/dL (ref 32.0–36.0)
MCV: 97 fL (ref 80–100)
Platelet: 218 10*3/uL (ref 150–440)
RBC: 4.3 10*6/uL (ref 3.80–5.20)
RDW: 13.2 % (ref 11.5–14.5)
WBC: 7 10*3/uL (ref 3.6–11.0)

## 2014-06-10 LAB — TROPONIN I

## 2014-06-10 LAB — LIPASE, BLOOD: Lipase: 96 U/L (ref 73–393)

## 2014-06-11 ENCOUNTER — Inpatient Hospital Stay: Payer: Self-pay | Admitting: Internal Medicine

## 2014-06-11 DIAGNOSIS — K589 Irritable bowel syndrome without diarrhea: Secondary | ICD-10-CM | POA: Diagnosis present

## 2014-06-11 DIAGNOSIS — Z807 Family history of other malignant neoplasms of lymphoid, hematopoietic and related tissues: Secondary | ICD-10-CM | POA: Diagnosis not present

## 2014-06-11 DIAGNOSIS — Z882 Allergy status to sulfonamides status: Secondary | ICD-10-CM | POA: Diagnosis not present

## 2014-06-11 DIAGNOSIS — K567 Ileus, unspecified: Secondary | ICD-10-CM | POA: Diagnosis not present

## 2014-06-11 DIAGNOSIS — F329 Major depressive disorder, single episode, unspecified: Secondary | ICD-10-CM | POA: Diagnosis present

## 2014-06-11 DIAGNOSIS — E785 Hyperlipidemia, unspecified: Secondary | ICD-10-CM | POA: Diagnosis not present

## 2014-06-11 DIAGNOSIS — K560 Paralytic ileus: Secondary | ICD-10-CM | POA: Diagnosis present

## 2014-06-11 DIAGNOSIS — Z9049 Acquired absence of other specified parts of digestive tract: Secondary | ICD-10-CM | POA: Diagnosis not present

## 2014-06-11 DIAGNOSIS — K9289 Other specified diseases of the digestive system: Secondary | ICD-10-CM | POA: Diagnosis not present

## 2014-06-11 DIAGNOSIS — Z79899 Other long term (current) drug therapy: Secondary | ICD-10-CM | POA: Diagnosis not present

## 2014-06-11 DIAGNOSIS — K219 Gastro-esophageal reflux disease without esophagitis: Secondary | ICD-10-CM | POA: Diagnosis not present

## 2014-06-11 DIAGNOSIS — F0390 Unspecified dementia without behavioral disturbance: Secondary | ICD-10-CM | POA: Diagnosis present

## 2014-06-11 DIAGNOSIS — N3941 Urge incontinence: Secondary | ICD-10-CM | POA: Diagnosis present

## 2014-06-12 LAB — BASIC METABOLIC PANEL
Anion Gap: 9 (ref 7–16)
BUN: 17 mg/dL (ref 7–18)
Calcium, Total: 8.2 mg/dL — ABNORMAL LOW (ref 8.5–10.1)
Chloride: 104 mmol/L (ref 98–107)
Co2: 26 mmol/L (ref 21–32)
Creatinine: 0.8 mg/dL (ref 0.60–1.30)
EGFR (African American): >60
EGFR (Non-African Amer.): >60
Glucose: 93 mg/dL (ref 65–99)
Osmolality: 279 (ref 275–301)
Potassium: 4.4 mmol/L (ref 3.5–5.1)
Sodium: 139 mmol/L (ref 136–145)

## 2014-06-12 LAB — TSH: Thyroid Stimulating Horm: 1.9 u[IU]/mL

## 2014-06-12 LAB — CBC WITH DIFFERENTIAL/PLATELET
Basophil #: 0 10*3/uL (ref 0.0–0.1)
Basophil %: 0.3 %
Eosinophil #: 0.1 10*3/uL (ref 0.0–0.7)
Eosinophil %: 0.6 %
HCT: 37.9 % (ref 35.0–47.0)
HGB: 12.6 g/dL (ref 12.0–16.0)
Lymphocyte #: 0.6 10*3/uL — ABNORMAL LOW (ref 1.0–3.6)
Lymphocyte %: 6 %
MCH: 31.9 pg (ref 26.0–34.0)
MCHC: 33.2 g/dL (ref 32.0–36.0)
MCV: 96 fL (ref 80–100)
Monocyte #: 1 x10 3/mm — ABNORMAL HIGH (ref 0.2–0.9)
Monocyte %: 9.7 %
Neutrophil #: 8.2 10*3/uL — ABNORMAL HIGH (ref 1.4–6.5)
Neutrophil %: 83.4 %
Platelet: 204 10*3/uL (ref 150–440)
RBC: 3.95 10*6/uL (ref 3.80–5.20)
RDW: 13 % (ref 11.5–14.5)
WBC: 9.9 10*3/uL (ref 3.6–11.0)

## 2014-06-12 LAB — HEMOGLOBIN A1C: Hemoglobin A1C: 5.6 % (ref 4.2–6.3)

## 2014-06-21 ENCOUNTER — Other Ambulatory Visit: Payer: Self-pay | Admitting: Family Medicine

## 2014-06-22 ENCOUNTER — Ambulatory Visit: Payer: Medicare Other | Admitting: Family Medicine

## 2014-06-22 ENCOUNTER — Ambulatory Visit (INDEPENDENT_AMBULATORY_CARE_PROVIDER_SITE_OTHER): Payer: Medicare Other | Admitting: Family Medicine

## 2014-06-22 ENCOUNTER — Encounter: Payer: Self-pay | Admitting: Family Medicine

## 2014-06-22 VITALS — BP 118/64 | HR 69 | Temp 97.4°F | Wt 168.0 lb

## 2014-06-22 DIAGNOSIS — K5909 Other constipation: Secondary | ICD-10-CM

## 2014-06-22 DIAGNOSIS — K529 Noninfective gastroenteritis and colitis, unspecified: Secondary | ICD-10-CM | POA: Insufficient documentation

## 2014-06-22 DIAGNOSIS — K5900 Constipation, unspecified: Secondary | ICD-10-CM

## 2014-06-22 LAB — BASIC METABOLIC PANEL
BUN: 23 mg/dL (ref 6–23)
CALCIUM: 8.8 mg/dL (ref 8.4–10.5)
CHLORIDE: 104 meq/L (ref 96–112)
CO2: 25 meq/L (ref 19–32)
CREATININE: 0.87 mg/dL (ref 0.40–1.20)
GFR: 67.32 mL/min (ref >60.00)
GLUCOSE: 115 mg/dL — AB (ref 70–99)
Potassium: 4 mEq/L (ref 3.5–5.1)
Sodium: 137 mEq/L (ref 135–145)

## 2014-06-22 NOTE — Progress Notes (Signed)
Pre visit review using our clinic review tool, if applicable. No additional management support is needed unless otherwise documented below in the visit note. 

## 2014-06-22 NOTE — Patient Instructions (Signed)
Great to see you. I am glad you are feeling better and say hello to your husband for me.  We will call you with your lab results.

## 2014-06-22 NOTE — Assessment & Plan Note (Signed)
Resolved. Will check electrolytes and renal function today.  Tolerating po without difficulty. Call or return to clinic prn if these symptoms worsen or fail to improve as anticipated. The patient indicates understanding of these issues and agrees with the plan.

## 2014-06-22 NOTE — Progress Notes (Signed)
Subjective:   Patient ID: Jamie Matthews, female    DOB: May 28, 1940, 74 y.o.   MRN: [id number]  Jamie Matthews is a pleasant 74 y.o. year old female who presents to clinic today with Hospitalization Follow-up  on 06/22/2014  HPI: Notes reviewed- Went to [facility]/10/14 for persistent vomiting.  Has been having worsening constipation- saw Dr. Diona Browner for this the day prior.  Since her husband's stroke, she had not been taking her miralax or linzess regularly.  In ER, given mag citrates, suppositories and an enema without success. CT of abdomen showed ileitis.  Admitted- NTG placed, bowel rest.  D/c'd home on 06/13/14 once she was tolerating po.  Feels much better now.  Taking miralax and linzess regularly.  Had normal BM today. No further nausea or vomiting.  No blood in her stool.  Current Outpatient Prescriptions on File Prior to Visit  Medication Sig Dispense Refill  . atorvastatin (LIPITOR) 20 MG tablet TAKE 1 TABLET EVERY DAY 90 tablet 1  . BIOTIN 5000 PO Take by mouth.    . Calcium Carbonate Antacid (ANTACID PO) Take by mouth as needed.    . Ciclopirox 1 % shampoo USE AS DIRECTED 120 mL 0  . DEXILANT 60 MG capsule TAKE ONE CAPSULE TWICE A DAY 60 capsule 11  . diphenhydrAMINE (BENADRYL) 25 MG tablet Take 50 mg by mouth daily.     [name] LINZESS 290 MCG CAPS capsule TAKE ONE CAPSULE DAILY 30 capsule 2  . MAGNESIUM PO Take by mouth.    . memantine (NAMENDA) 10 MG tablet TAKE ONE TABLET TWICE A DAY 60 tablet 2  . Multiple Vitamins-Minerals (CENTRUM PO) Take one by mouth daily    . MYRBETRIQ 50 MG TB24 tablet TAKE 1 TABLET EVERY DAY 30 tablet 2  . Phenylephrine-Acetaminophen (TYLENOL SINUS CONGESTION/PAIN) 5-325 MG TABS Take 2 tablets by mouth as needed.    . polyethylene glycol powder (GLYCOLAX/MIRALAX) powder Take 17 g by mouth as needed.    . pramoxine-hydrocortisone (ANALPRAM-HC) 1-1 % rectal cream Place rectally 2 (two) times daily. 30 g 0  . Propylene  Glycol (SYSTANE BALANCE OP) Apply to eye as needed.    . ranitidine (ZANTAC) 150 MG tablet TAKE ONE TABLET TWICE A DAY 60 tablet 5  . traZODone (DESYREL) 50 MG tablet Take 0.5-1 tablets (25-50 mg total) by mouth at bedtime as needed for sleep. 30 tablet 3  . triamcinolone (NASACORT) 55 MCG/ACT nasal inhaler Place 2 sprays into the nose daily.    [name] venlafaxine XR (EFFEXOR-XR) 150 MG 24 hr capsule TAKE ONE CAPSULE DAILY 30 capsule 2   No current facility-administered medications on file prior to visit.    Allergies  Allergen Reactions  . Lipitor [Atorvastatin]     Itching   . Sulfa Antibiotics Rash    Past Medical History  Diagnosis Date  . Urinary incontinence   . Depression   . GERD (gastroesophageal reflux disease)   . Diverticulitis   . Hyperlipidemia   . Fibromyalgia   . Pneumonia 11/18/12    Past Surgical History  Procedure Laterality Date  . Cholecystectomy    . Tonsillectomy    . Bladder suspension  2004, 2012    Family History  Problem Relation Age of Onset  . Cancer Father   . Diabetes Father   . Heart disease Father   . Heart disease Mother     History   Social History  . Marital Status: Married    Spouse Name:  N/A    Number of Children: N/A  . Years of Education: N/A   Occupational History  . Retired    Social History Main Topics  . Smoking status: Never Smoker   . Smokeless tobacco: Never Used  . Alcohol Use: No  . Drug Use: No  . Sexual Activity: Not on file   Other Topics Concern  . Not on file   Social History Narrative   Recently moved with her husband to [place] from [place].   Husband is a retired Pharmacist, community.   No children.   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   The PMH, PSH, Social History, Family History, Medications, and allergies have been reviewed in [facility], and have been updated if relevant.   Review of Systems  Constitutional: Negative.   Gastrointestinal: Negative.   Endocrine: Negative.   Genitourinary:  Negative.   Skin: Negative.   Neurological: Negative.   Hematological: Negative.   Psychiatric/Behavioral: Negative.   All other systems reviewed and are negative.      Objective:    BP 118/64 mmHg  Pulse 69  Temp(Src) 97.4 F (36.3 C) (Oral)  Wt 168 lb (76.204 kg)  SpO2 95%  Wt Readings from Last 3 Encounters:  06/22/14 168 lb (76.204 kg)  06/09/14 161 lb 4 oz (73.143 kg)  12/13/13 158 lb 8 oz (71.895 kg)    Physical Exam  Constitutional: She is oriented to person, place, and time. She appears well-developed and well-nourished. No distress.  HENT:  Head: Normocephalic and atraumatic.  Eyes: Conjunctivae are normal.  Cardiovascular: Normal rate.   Pulmonary/Chest: Effort normal.  Abdominal: Soft. Bowel sounds are normal.  Mild diffuse abdominal tenderness, no rebound or guarding, soft No masses  Musculoskeletal: Normal range of motion. She exhibits no edema.  Neurological: She is alert and oriented to person, place, and time.  Skin: Skin is warm and dry.  Psychiatric: She has a normal mood and affect. Her behavior is normal. Judgment and thought content normal.  Nursing note and vitals reviewed.         Assessment & Plan:   Chronic constipation  Ileitis No Follow-up on file.

## 2014-06-22 NOTE — Assessment & Plan Note (Signed)
Under better control. Advised to continue bowel regimen and to restart physical activity- used to exercise regularly. The patient indicates understanding of these issues and agrees with the plan. Call or return to clinic prn if these symptoms worsen or fail to improve as anticipated. The patient indicates understanding of these issues and agrees with the plan.

## 2014-06-23 ENCOUNTER — Encounter: Payer: Self-pay | Admitting: *Deleted

## 2014-06-27 ENCOUNTER — Ambulatory Visit (INDEPENDENT_AMBULATORY_CARE_PROVIDER_SITE_OTHER): Payer: Medicare Other | Admitting: Psychology

## 2014-06-27 DIAGNOSIS — F4323 Adjustment disorder with mixed anxiety and depressed mood: Secondary | ICD-10-CM

## 2014-06-28 ENCOUNTER — Other Ambulatory Visit: Payer: Self-pay | Admitting: *Deleted

## 2014-06-28 MED ORDER — VENLAFAXINE HCL ER 150 MG PO CP24: 150.0000 mg | ORAL_CAPSULE | Freq: Every day | ORAL | Status: DC

## 2014-06-29 ENCOUNTER — Other Ambulatory Visit: Payer: Self-pay | Admitting: Family Medicine

## 2014-06-29 ENCOUNTER — Other Ambulatory Visit: Payer: Self-pay | Admitting: Gastroenterology

## 2014-06-30 DIAGNOSIS — H2513 Age-related nuclear cataract, bilateral: Secondary | ICD-10-CM | POA: Diagnosis not present

## 2014-07-03 ENCOUNTER — Other Ambulatory Visit: Payer: Self-pay | Admitting: Family Medicine

## 2014-07-12 ENCOUNTER — Ambulatory Visit (INDEPENDENT_AMBULATORY_CARE_PROVIDER_SITE_OTHER): Payer: Medicare Other | Admitting: Psychology

## 2014-07-12 DIAGNOSIS — F4323 Adjustment disorder with mixed anxiety and depressed mood: Secondary | ICD-10-CM | POA: Diagnosis not present

## 2014-07-17 ENCOUNTER — Ambulatory Visit (INDEPENDENT_AMBULATORY_CARE_PROVIDER_SITE_OTHER): Payer: Medicare Other | Admitting: Family Medicine

## 2014-07-17 ENCOUNTER — Encounter: Payer: Self-pay | Admitting: Family Medicine

## 2014-07-17 VITALS — BP 130/80 | HR 68 | Temp 97.5°F | Wt 159.2 lb

## 2014-07-17 DIAGNOSIS — K5900 Constipation, unspecified: Secondary | ICD-10-CM | POA: Diagnosis not present

## 2014-07-17 DIAGNOSIS — R059 Cough, unspecified: Secondary | ICD-10-CM | POA: Insufficient documentation

## 2014-07-17 DIAGNOSIS — R05 Cough: Secondary | ICD-10-CM | POA: Insufficient documentation

## 2014-07-17 DIAGNOSIS — K5909 Other constipation: Secondary | ICD-10-CM

## 2014-07-17 NOTE — Progress Notes (Signed)
Pre visit review using our clinic review tool, if applicable. No additional management support is needed unless otherwise documented below in the visit note. 

## 2014-07-17 NOTE — Assessment & Plan Note (Signed)
New- exam reassuring. Likely viral.  Advised supportive care- limit rx if possible to avoid constipation- drink warm drinks with honey, rest, etc. Call or return to clinic prn if these symptoms worsen or fail to improve as anticipated. The patient indicates understanding of these issues and agrees with the plan.

## 2014-07-17 NOTE — Assessment & Plan Note (Signed)
Deteriorated, likely worsened by cough/cold rx and stopping linzess and miralax. Advised to restart her bowel regimen, drink plenty of fluids. Call or return to clinic prn if these symptoms worsen or fail to improve as anticipated. The patient indicates understanding of these issues and agrees with the plan.

## 2014-07-17 NOTE — Progress Notes (Signed)
Subjective:   Patient ID: Jamie Matthews, female    DOB: 1940-06-20, 74 y.o.   MRN: [id number]  Jamie Matthews is a pleasant 74 y.o. year old female who presents to clinic today with her husband for Cough; Constipation; and Abdominal Pain  on 07/17/2014  HPI:  H/o chronic constipation- was taking Linzess and miralax daily but recently stopped because she developed some URI symptoms and "did not feel like taking anything." Of note, was admitted for constipation/ileitis last month.  Had a BM yesterday but feels like she did not have a complete evacuation of stool.  Still gasy and bloated.  4 days ago, developed cough, runny nose.  Taking Mucinex and Robitussin with some relief of symptoms.  Cough is becoming less productive.  No fevers or chills.  Very tired.  Current Outpatient Prescriptions on File Prior to Visit  Medication Sig Dispense Refill  . atorvastatin (LIPITOR) 20 MG tablet TAKE 1 TABLET EVERY DAY 90 tablet 1  . BIOTIN 5000 PO Take by mouth.    . Calcium Carbonate Antacid (ANTACID PO) Take by mouth as needed.    . Ciclopirox 1 % shampoo USE AS DIRECTED 120 mL 0  . DEXILANT 60 MG capsule TAKE ONE CAPSULE TWICE A DAY 60 capsule 11  . diphenhydrAMINE (BENADRYL) 25 MG tablet Take 50 mg by mouth daily.     [name] LINZESS 290 MCG CAPS capsule TAKE ONE CAPSULE DAILY 30 capsule 3  . MAGNESIUM PO Take by mouth.    . memantine (NAMENDA) 10 MG tablet TAKE ONE TABLET TWICE A DAY 60 tablet 5  . Multiple Vitamins-Minerals (CENTRUM PO) Take one by mouth daily    . MYRBETRIQ 50 MG TB24 tablet TAKE 1 TABLET EVERY DAY 30 tablet 2  . Phenylephrine-Acetaminophen (TYLENOL SINUS CONGESTION/PAIN) 5-325 MG TABS Take 2 tablets by mouth as needed.    . polyethylene glycol powder (GLYCOLAX/MIRALAX) powder Take 17 g by mouth as needed.    . pramoxine-hydrocortisone (ANALPRAM-HC) 1-1 % rectal cream Place rectally 2 (two) times daily. 30 g 0  . Propylene Glycol (SYSTANE BALANCE OP)  Apply to eye as needed.    . ranitidine (ZANTAC) 150 MG tablet TAKE ONE TABLET TWICE A DAY 60 tablet 11  . traZODone (DESYREL) 50 MG tablet Take 0.5-1 tablets (25-50 mg total) by mouth at bedtime as needed for sleep. 30 tablet 3  . triamcinolone (NASACORT) 55 MCG/ACT nasal inhaler Place 2 sprays into the nose daily.    [name] venlafaxine XR (EFFEXOR-XR) 150 MG 24 hr capsule Take 1 capsule (150 mg total) by mouth daily. 30 capsule 5   No current facility-administered medications on file prior to visit.    Allergies  Allergen Reactions  . Lipitor [Atorvastatin]     Itching   . Sulfa Antibiotics Rash    Past Medical History  Diagnosis Date  . Urinary incontinence   . Depression   . GERD (gastroesophageal reflux disease)   . Diverticulitis   . Hyperlipidemia   . Fibromyalgia   . Pneumonia 11/18/12    Past Surgical History  Procedure Laterality Date  . Cholecystectomy    . Tonsillectomy    . Bladder suspension  2004, 2012    Family History  Problem Relation Age of Onset  . Cancer Father   . Diabetes Father   . Heart disease Father   . Heart disease Mother     History   Social History  . Marital Status: Married    Spouse Name:  N/A  . Number of Children: N/A  . Years of Education: N/A   Occupational History  . Retired    Social History Main Topics  . Smoking status: Never Smoker   . Smokeless tobacco: Never Used  . Alcohol Use: No  . Drug Use: No  . Sexual Activity: Not on file   Other Topics Concern  . Not on file   Social History Narrative   Recently moved with her husband to [place] from [place].   Husband is a retired Pharmacist, community.   No children.   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   The PMH, PSH, Social History, Family History, Medications, and allergies have been reviewed in [facility] and have been updated if relevant.   Review of Systems  Constitutional: Positive for fatigue. Negative for fever.  HENT: Positive for congestion, postnasal drip  and rhinorrhea. Negative for sinus pressure and trouble swallowing.   Respiratory: Positive for cough. Negative for choking, shortness of breath, wheezing and stridor.   Cardiovascular: Negative.   Gastrointestinal: Positive for nausea and constipation. Negative for blood in stool.  Genitourinary: Negative.   Musculoskeletal: Negative.   Skin: Negative.   Neurological: Negative.   All other systems reviewed and are negative.      Objective:    BP 130/80 mmHg  Pulse 68  Temp(Src) 97.5 F (36.4 C) (Oral)  Wt 159 lb 4 oz (72.235 kg)  SpO2 96%   Physical Exam  Constitutional: She is oriented to person, place, and time. She appears well-developed and well-nourished. No distress.  HENT:  Head: Normocephalic.  + rhinorrhea Sinuses NTTP  Eyes: Conjunctivae are normal.  Neck: Normal range of motion.  Cardiovascular: Normal rate.   Pulmonary/Chest: Effort normal and breath sounds normal. No respiratory distress. She has no wheezes. She has no rales. She exhibits no tenderness.  Abdominal: Soft. Bowel sounds are normal. She exhibits no distension and no mass. There is no tenderness. There is no rebound and no guarding.  Musculoskeletal: Normal range of motion.  Neurological: She is alert and oriented to person, place, and time. No cranial nerve deficit.  Skin: Skin is warm and dry.  Psychiatric: She has a normal mood and affect. Her behavior is normal. Judgment and thought content normal.  Nursing note and vitals reviewed.         Assessment & Plan:   Chronic constipation  Cough No Follow-up on file.

## 2014-07-17 NOTE — Patient Instructions (Signed)
Good to see you. This is likely viral.  Restart your miralax and linzess.  Drink plenty of fluids.  Keep me updated.

## 2014-07-21 ENCOUNTER — Telehealth: Payer: Self-pay | Admitting: Family Medicine

## 2014-07-21 ENCOUNTER — Telehealth: Payer: Self-pay | Admitting: Gastroenterology

## 2014-07-21 NOTE — Telephone Encounter (Signed)
Pt called stating she receive a letter from [facility] stating it is time for another follow up mammogram.  Pt  Had one 6 months ago  It was normal.  Pt doesn't want to go for the 20month follow up she perfers to wait

## 2014-07-24 NOTE — Telephone Encounter (Signed)
Dr Hilarie Fredrickson, will you take her on as a patient?

## 2014-07-24 NOTE — Telephone Encounter (Signed)
When she was seen in June, 2015.  Hyomax was ordered and she was supposed to be scheduled for colonoscopy.  I do not see where this was done.  So as far as not being satisfied with her treatment, she's not been seen since that initial visit.  I don't mind having her switch doctors but I would point out to her that she did not follow through with therapy as was recommended.

## 2014-07-24 NOTE — Telephone Encounter (Signed)
Okay to release this patient? I don't see any recent events.

## 2014-07-26 ENCOUNTER — Ambulatory Visit (INDEPENDENT_AMBULATORY_CARE_PROVIDER_SITE_OTHER): Payer: Medicare Other | Admitting: Psychology

## 2014-07-26 DIAGNOSIS — F4323 Adjustment disorder with mixed anxiety and depressed mood: Secondary | ICD-10-CM | POA: Diagnosis not present

## 2014-07-26 NOTE — Telephone Encounter (Signed)
I have left message for the patient to call back 

## 2014-07-26 NOTE — Telephone Encounter (Signed)
Ok for OV, new pt slot

## 2014-07-27 NOTE — Telephone Encounter (Signed)
Appointment made and the patient is aware.

## 2014-07-27 NOTE — Telephone Encounter (Signed)
Pt said she is returning your call °

## 2014-08-07 ENCOUNTER — Other Ambulatory Visit: Payer: Self-pay | Admitting: Family Medicine

## 2014-09-01 ENCOUNTER — Encounter: Payer: Self-pay | Admitting: *Deleted

## 2014-09-11 ENCOUNTER — Telehealth: Payer: Self-pay | Admitting: Family Medicine

## 2014-09-11 DIAGNOSIS — R921 Mammographic calcification found on diagnostic imaging of breast: Secondary | ICD-10-CM

## 2014-09-11 NOTE — Telephone Encounter (Signed)
Orders entered. Thank you.

## 2014-09-11 NOTE — Telephone Encounter (Signed)
Patient needs an order for an Diagnostic Right  Breast MMG and Right Breast US for 6 month FU due to calcifications. Please put order in Epic.

## 2014-09-11 NOTE — Telephone Encounter (Signed)
Orders for Bone Density and MMG and [place] were faxed.

## 2014-09-14 ENCOUNTER — Encounter: Payer: Self-pay | Admitting: Internal Medicine

## 2014-09-21 ENCOUNTER — Ambulatory Visit
Admit: 2014-09-21 | Disposition: A | Discharge status: Home / Self Care | Payer: Self-pay | Attending: Family Medicine | Admitting: Family Medicine

## 2014-09-21 DIAGNOSIS — R921 Mammographic calcification found on diagnostic imaging of breast: Secondary | ICD-10-CM | POA: Diagnosis not present

## 2014-09-21 DIAGNOSIS — M8589 Other specified disorders of bone density and structure, multiple sites: Secondary | ICD-10-CM | POA: Diagnosis not present

## 2014-09-21 DIAGNOSIS — Z780 Asymptomatic menopausal state: Secondary | ICD-10-CM | POA: Diagnosis not present

## 2014-09-21 DIAGNOSIS — M8580 Other specified disorders of bone density and structure, unspecified site: Secondary | ICD-10-CM | POA: Diagnosis not present

## 2014-09-24 NOTE — Discharge Summary (Signed)
PATIENT NAME:  Jamie Matthews, Jamie Matthews MR#:  [id number] DATE OF BIRTH:  1941-05-05  DATE OF ADMISSION:  06/11/2014 DATE OF DISCHARGE:  06/13/2014  ADMITTING DIAGNOSIS: Ileus.   DISCHARGE DIAGNOSES:  1.  Ileus, resolved, without obstruction.  2.  Gastroesophageal reflux disease.  3.  Irritable bowel syndrome.  4.  Hyperlipidemia.  5.  Dementia.  6.  Depression.  7.  Urge incontinence.   DISCHARGE MEDICATIONS:  1.  Atorvastatin 20 mg, 1 tablet daily.  2.  Venlafaxine 150 mg, 1 tablet daily.  3.  Systane Balance ophthalmic solution, 1 drop into each affected eye 2 times a day.  4.  Linzess 290 mcg, 1 capsule orally once a day.  5.  Premarin vaginal 0.625 mg/g vaginal cream.  6.  Tretinoin topical, apply to affected area twice a day.  7.  MiraLax oral powder 17 g once a day as needed for constipation.  8.  Biotin 5000 units once a day.  9.  ProctoCream HC, apply 3 times a day as needed for symptoms.  10.  Magnesium 400 mg, 1 tablet daily.  11.  Namenda 10 mg, 1 tablet twice a day.  12.  Myrbetriq 50 mg, 1 tablet once a day.  13.  Centrum Silver, 1 tablet daily.  14.  Zantac 150 mg, 1 tablet 2 times a day.  15.  Nasacort 55 mg/inhalation nasal spray, 2 sprays once a day into each nostril.   CONSULTATIONS: None.   PROCEDURES: CT scan of the abdomen 06/10/2014 shows findings consistent with ileus. Small bowel diffusely fluid-filled and prominent, with no transition point or inflammatory change. Moderate to large volume colonic stool with redundant colon. No findings to suggest small-bowel obstruction.   HISTORY OF PRESENT ILLNESS: This 74 year old Caucasian female presents to the Emergency Room complaining of abdominal discomfort for 3 days. She states that she began vomiting 2 days ago. On presentation to the Emergency Room, CT scan showed an ileus and she is admitted for further evaluation and treatment.   HOSPITAL COURSE BY PROBLEM:  1.  Ileus seen on CT scan on admission: A  nasogastric tube was placed for decompression and initially had significant output during intermittent suction. She was hydrated well on bowel rest. Within 24 hours she had significant improvement in symptoms, decreased nausea, and no pain. Bowel sounds had returned. Her abdomen was much less distended. The NG tube was removed. She was transitioned to a clear then full then regular diet before discharge. At the time of discharge, she is tolerating a regular diet. She is having normal bowel movements.  2.  Gastroesophageal reflux disease: I notice that she is on 3 different antacid medications. I have recommended that she choose just one of these medications as redundant therapy may be contributing to her ileus.  3.  Irritable bowel syndrome: She had been on Linzess in the past. This may have contributed to the paralytic ileus and she was advised to take the medication only as needed, not daily.  4.  Hyperlipidemia: She was continued on atorvastatin.  5.  Dementia with depression: She continues on Namenda and Effexor.  6.  Urge incontinence: The patient is to continue with Myrbetriq after discharge.   DISCHARGE PHYSICAL EXAMINATION:  VITAL SIGNS: Temperature 98.5, heart rate 62, respirations 20, blood pressure 144/80, oxygenation 93% on room air.  GENERAL: No acute distress.  CARDIOVASCULAR: Regular rate and rhythm. No murmurs, rubs, or gallops.  RESPIRATORY: Lungs clear to auscultation bilaterally with good air movement.  ABDOMEN: Soft,  nontender, nondistended. Bowel sounds are normal. There is no guarding or rebound.  PSYCHIATRIC: The patient is alert and oriented with good insight into her clinical condition. No signs of uncontrolled depression or anxiety.    LABORATORY DATA: Sodium 139, potassium 4.4, chloride 104, bicarbonate 26, BUN 17, creatinine 0.8, glucose 93. Lipase 96. Hemoglobin A1c 5.6. LFTs all normal. Troponin less than 0.02. TSH 1.90. White blood cells 9.9, hemoglobin 12.6, platelets  204,000, MCV 96.   CONDITION: Stable.   DISPOSITION: Discharged to home with no further home health needs.   DISCHARGE INSTRUCTIONS:  DIET: Regular diet.   ACTIVITY: No restrictions.   TIMEFRAME FOR FOLLOWUP: Follow up in 2 to 4 weeks with primary care.   TIME SPENT ON DISCHARGE: 35 minutes.   ____________________________ Earleen Newport. Volanda Napoleon, MD cpw:ST D: 06/15/2014 20:10:44 ET T: 06/15/2014 21:09:33 ET JOB#: [id number]  cc: Earleen Newport. Volanda Napoleon, MD, <Dictator> Aldean Jewett MD ELECTRONICALLY SIGNED 06/27/2014 9:45

## 2014-09-24 NOTE — H&P (Signed)
PATIENT NAME:  Jamie, Matthews MR#:  [id number] DATE OF BIRTH:  05-Jul-1940  DATE OF ADMISSION:  06/11/2014  REFERRING PHYSICIAN: Yetta Numbers. Karma Greaser, MD  PRIMARY CARE PHYSICIAN: Nonlocal.   ADMISSION DIAGNOSIS: Ileus.  HISTORY OF PRESENT ILLNESS: This is a 74 year old Caucasian female who presents to the Emergency Department after complaining of abdominal discomfort for 3 days. The patient states that the discomfort is a background sensation. It is not excruciatingly painful. She admits to being constipated lately, but began vomiting 2 days ago. At that time, her emesis was simply the contents of her stomach. Yesterday, however, the emesis became bilious. It has not been bloody. The patient admits to having abdominal discomfort before as well as constipation associated with her irritable bowel syndrome, but has never had an ileus or inability to have a bowel movement such as what she has been experiencing with this episode. In the Emergency Department, the patient received multiple cathartics as well as an enema, which produced no evacuation of stool. She then had a CT scan of her abdomen, which showed adynamic ileus without obstruction, which prompted the Emergency Department to call for admission.   REVIEW OF SYSTEMS:  CONSTITUTIONAL: The patient denies fever or weakness.  EYES: Denies blurred vision or inflammation.  EARS, NOSE AND THROAT: Denies tinnitus or sore throat.  RESPIRATORY: Denies cough or shortness of breath.  CARDIOVASCULAR: Denies chest pain, palpitations, orthopnea, paroxysmal nocturnal dyspnea.  GASTROINTESTINAL: Admits to nausea, vomiting, and some abdominal pain but denies diarrhea. The patient has a history of chronic constipation.  GENITOURINARY: The patient denies dysuria, increased frequency, or hesitancy but admits to urge incontinence.  ENDOCRINE: Denies polyuria or polydipsia.  HEMATOLOGIC AND LYMPHATIC: Denies easy bruising or bleeding.  INTEGUMENT: Denies rashes or  lesions.  MUSCULOSKELETAL: The patient denies arthralgias or myalgias.  NEUROLOGIC: Denies numbness in her extremities or dysarthria.  PSYCHIATRIC: The patient denies active depression or suicidal ideation.   PAST MEDICAL HISTORY: Gastroesophageal reflux disease, irritable bowel syndrome, hyperlipidemia, dementia, and depression.   PAST SURGICAL HISTORY: Cholecystectomy.   SOCIAL HISTORY: The patient lives with her husband. She does not smoke, drink, or do any drugs.   FAMILY HISTORY: The patient's father is deceased of some form of lymphoma that affected his gut.   MEDICATIONS:  1.  Atorvastatin 20 mg 1 tablet p.o. at bedtime.  2.  Biotin 5000 mg 1 tablet p.o. daily.  3.  Centrum multivitamin 1 tab p.o. daily.  4.  Clobetasol topical ointment applied to affected area as needed.  5.  Linzess 290 mcg 1 capsule p.o. daily.  6.  Magnesium, dose unspecified, 1 tablet p.o. daily.  7.  MiraLax oral powder for reconstitution daily for constipation.  8.  Myrbetriq 50 mg extended release 1 tablet p.o. daily.  9.  Namenda 10 mg 1 tablet p.o. b.i.d.  10.  Nasacort 55 mcg/inhalations 2 sprays nasally every day.  11.  Omeprazole 20 mg extended release 1 capsule p.o. daily.  12.  Premarin vaginal cream 0.625 mg/g apply with applicator as needed.  13.  ProctoCream HC applied as needed.  14.  Sustain ophthalmic solution 1 drop to each affected eye b.i.d.  15.  Tretinoin  topical ointment, dose unspecified, apply as directed.  16.  Venlafaxine 150 mg extended release 1 tablet p.o. daily.  17.  Zantac 150 mg 1 tablet p.o. b.i.d.   ALLERGIES: SULFA DRUGS.   PERTINENT LABORATORY RESULTS AND RADIOGRAPHIC FINDINGS: Serum glucose 131, BUN 20, creatinine 0.87, serum sodium 140,  potassium 4.1, chloride 103, bicarbonate 29, calcium 9.1. Lipase is 96, serum albumin is 4.1, alkaline phosphatase 92, AST 32, ALT is 37. Troponin is negative. White blood cell count is 7, hemoglobin 13.7, hematocrit 41.5, platelet  count 218,000. MCV is 97. Urinalysis shows 9 white blood cells per high-power field and 5 red blood cells per field; this was a clean catch urine, but the patient does not have any urinary symptoms at this time. CT of the abdomen shows findings consistent with ileus the small bowel is diffusely fluid-filled. There is no transition point or inflammatory change. There is moderate to large volume of colonic stool within the redundant colon. There are no findings to suggest bowel obstruction.   PHYSICAL EXAMINATION:  VITAL SIGNS: Temperature is 98.3, pulse 64, respirations 18, blood pressure 134/61, pulse oximetry is 94% on 2 L oxygen via nasal cannula.  GENERAL: The patient is alert and oriented x 3, in no apparent distress.  HEENT: Normocephalic, atraumatic. Pupils equal, round, and reactive to light and accommodation. Extraocular movements are intact. Mucous membranes are moist. There is a nasogastric tube in place.  NECK: Trachea is midline. No adenopathy. Thyroid is nonpalpable and nontender.  CHEST: Symmetric, atraumatic.  CARDIOVASCULAR: Regular rate and rhythm. Normal S1 and S2. No rubs, clicks, or murmurs appreciated.  LUNGS: Clear to auscultation bilaterally. Normal effort and excursion.  ABDOMEN: Positive bowel sounds. Soft, nondistended, mildly tender in the left lower quadrant without guarding or rebound tenderness. There is no hepatosplenomegaly.  GENITOURINARY: Deferred.  MUSCULOSKELETAL: The patient moves all 4 extremities equally. There is normal range of motion in all 4 extremities bilaterally.  SKIN: Warm and dry. No rashes or lesions.  EXTREMITIES: No clubbing, cyanosis, or edema, although the patient does have some tenderness of her left lower shin.  NEUROLOGIC: Cranial nerves II-XII are grossly intact.  PSYCHIATRIC: Mood is normal. Affect is congruent. The patient has excellent insight and judgment into her medical condition.   ASSESSMENT AND PLAN: This is a 74 year old female  admitted for ileus.  1.  Ileus without obstruction: The patient has a nonsurgical abdomen. We will place her on bowel rest. She. She has an nasogastric tube in place for decompression. We will continue to monitor her progress and hydrate her while she is n.p.o.  2.  Gastroesophageal reflux disease: H2-blocker b.i.d.  3.  Irritable bowel syndrome: I have held her Linzess for now, as this may be contributing to her paralytic ileus.  4.  Hyperlipidemia: Continue atorvastatin.  5.  Dementia: Continue Namenda.  6.  Depression: Continue Effexor XR.  7.  Urge incontinence:  I have also held the patient's Myrbetriq for now, as this antispasmodic  may have some secondary effect from her peristalsis.  8.  Deep vein thrombosis prophylaxis: Lovenox.  9.  Gastrointestinal prophylaxis: As above.   CODE STATUS: The patient is a full code.   TIME SPENT ON ADMISSION ORDERS AND PATIENT CARE: Approximately 35 minutes.   ____________________________ Norva Riffle. Marcille Blanco, MD msd:bm D: 06/11/2014 02:18:45 ET T: 06/11/2014 02:47:49 ET JOB#: [id number]  cc: Norva Riffle. Marcille Blanco, MD, <Dictator> Norva Riffle Lacresia Darwish MD ELECTRONICALLY SIGNED 06/20/2014 2:34

## 2014-09-28 ENCOUNTER — Ambulatory Visit: Payer: Self-pay | Admitting: Internal Medicine

## 2014-10-09 DIAGNOSIS — M216X1 Other acquired deformities of right foot: Secondary | ICD-10-CM | POA: Diagnosis not present

## 2014-10-09 DIAGNOSIS — M216X2 Other acquired deformities of left foot: Secondary | ICD-10-CM | POA: Diagnosis not present

## 2014-10-09 DIAGNOSIS — M898X9 Other specified disorders of bone, unspecified site: Secondary | ICD-10-CM | POA: Diagnosis not present

## 2014-10-10 ENCOUNTER — Encounter: Payer: Self-pay | Admitting: Family Medicine

## 2014-10-10 ENCOUNTER — Ambulatory Visit (INDEPENDENT_AMBULATORY_CARE_PROVIDER_SITE_OTHER): Payer: Medicare Other | Admitting: Family Medicine

## 2014-10-10 VITALS — BP 132/72 | HR 60 | Temp 98.1°F | Wt 166.2 lb

## 2014-10-10 DIAGNOSIS — G4700 Insomnia, unspecified: Secondary | ICD-10-CM

## 2014-10-10 DIAGNOSIS — F5104 Psychophysiologic insomnia: Secondary | ICD-10-CM

## 2014-10-10 DIAGNOSIS — Z6379 Other stressful life events affecting family and household: Secondary | ICD-10-CM | POA: Diagnosis not present

## 2014-10-10 DIAGNOSIS — F331 Major depressive disorder, recurrent, moderate: Secondary | ICD-10-CM | POA: Diagnosis not present

## 2014-10-10 DIAGNOSIS — K219 Gastro-esophageal reflux disease without esophagitis: Secondary | ICD-10-CM | POA: Diagnosis not present

## 2014-10-10 MED ORDER — OMEPRAZOLE 20 MG PO CPDR: 20.0000 mg | DELAYED_RELEASE_CAPSULE | Freq: Every day | ORAL | Status: DC

## 2014-10-10 NOTE — Progress Notes (Signed)
Subjective:   Patient ID: Jamie Matthews, female    DOB: [birth year], 74 y.o.   MRN: [id number]  Jamie Matthews is a pleasant 74 y.o. year old female who presents to clinic today with Abdominal Pain and Gastrophageal Reflux  on 10/10/2014  HPI: Followed by Dr. Deatra Ina (GI).  Saw him on 6/315- note reviewed. Recommended continuing [place]. Also taking raniditine.  I have seen her intermittently for this complaint.  She will feel better and stop taking her rxs because "she takes too many," and then comes in because reflux symptoms worsen.  He did her endscopy on 12/02/12- reviewed again today.  Did have dilation done of esophagus and polyps noted, otherwise unremarkable. Dysphagia resolved after dilatation.  Not sleeping well but stopped taking Trazodone because she thought she didn't need it.  She is very concerned about her husband and spends most of the OV asking me questions about what the specialists have told her about his various conditions and symptoms.     Patient Active Problem List   Diagnosis Date Noted  . Cough 07/17/2014  . Ileitis 06/22/2014  . Chronic insomnia 09/20/2013  . Allergic dermatitis 09/20/2013  . Emesis 07/08/2013  . DNR (do not resuscitate) 11/15/2012  . Chronic constipation 04/01/2012  . 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 Urinary incontinence   . Major depressive disorder, recurrent, moderate   . GERD (gastroesophageal reflux disease)   . Diverticulitis   . Hyperlipidemia    Past Medical History  Diagnosis Date  . Urinary incontinence   . Depression   . GERD (gastroesophageal reflux disease)   . Diverticulitis   . Hyperlipidemia   . Fibromyalgia   . Pneumonia 11/18/12  . Internal hemorrhoids    Past Surgical History  Procedure Laterality Date  . Cholecystectomy    . Tonsillectomy    . Bladder suspension  2004, 2012   History  Substance Use Topics  . Smoking status: Never Smoker   . Smokeless tobacco: Never Used    . Alcohol Use: No   Family History  Problem Relation Age of Onset  . Cancer Father   . Diabetes Father   . Heart disease Father   . Heart disease Mother    Allergies  Allergen Reactions  . Lipitor [Atorvastatin]     Itching   . Sulfa Antibiotics Rash   Current Outpatient Prescriptions on File Prior to Visit  Medication Sig Dispense Refill  . atorvastatin (LIPITOR) 20 MG tablet TAKE 1 TABLET EVERY DAY 90 tablet 1  . BIOTIN 5000 PO Take by mouth.    . Calcium Carbonate Antacid (ANTACID PO) Take by mouth as needed.    . Ciclopirox 1 % shampoo USE AS DIRECTED 120 mL 0  . DEXILANT 60 MG capsule TAKE ONE CAPSULE TWICE A DAY 60 capsule 11  . diphenhydrAMINE (BENADRYL) 25 MG tablet Take 50 mg by mouth daily.     [name] LINZESS 290 MCG CAPS capsule TAKE ONE CAPSULE DAILY 30 capsule 3  . MAGNESIUM PO Take by mouth.    . memantine (NAMENDA) 10 MG tablet TAKE ONE TABLET TWICE A DAY 60 tablet 5  . Multiple Vitamins-Minerals (CENTRUM PO) Take one by mouth daily    . MYRBETRIQ 50 MG TB24 tablet TAKE 1 TABLET EVERY DAY 30 tablet 5  . Phenylephrine-Acetaminophen (TYLENOL SINUS CONGESTION/PAIN) 5-325 MG TABS Take 2 tablets by mouth as needed.    . polyethylene glycol powder (GLYCOLAX/MIRALAX) powder Take 17 g by mouth as needed.    [name]  pramoxine-hydrocortisone (ANALPRAM-HC) 1-1 % rectal cream Place rectally 2 (two) times daily. 30 g 0  . Propylene Glycol (SYSTANE BALANCE OP) Apply to eye as needed.    . ranitidine (ZANTAC) 150 MG tablet TAKE ONE TABLET TWICE A DAY 60 tablet 11  . traZODone (DESYREL) 50 MG tablet Take 0.5-1 tablets (25-50 mg total) by mouth at bedtime as needed for sleep. 30 tablet 3  . triamcinolone (NASACORT) 55 MCG/ACT nasal inhaler Place 2 sprays into the nose daily.    [name] venlafaxine XR (EFFEXOR-XR) 150 MG 24 hr capsule Take 1 capsule (150 mg total) by mouth daily. 30 capsule 5   No current facility-administered medications on file prior to visit.   The PMH, PSH, Social History,  Family History, Medications, and allergies have been reviewed in [facility], and have been updated if relevant.     Review of Systems See HPI No dysphagia No vomiting No bloody stools Appetite good- weight stable Wt Readings from Last 3 Encounters:  10/10/14 166 lb 4 oz (75.411 kg)  07/17/14 159 lb 4 oz (72.235 kg)  06/22/14 168 lb (76.204 kg)  + anxiety Denies feeling depressed No SI or HI Difficulty falling and staying asleep   Objective:    BP 132/72 mmHg  Pulse 60  Temp(Src) 98.1 F (36.7 C) (Oral)  Wt 166 lb 4 oz (75.411 kg)  SpO2 99%   Physical Exam  Constitutional: She is oriented to person, place, and time. She appears well-developed and well-nourished. No distress.  Neurological: She is alert and oriented to person, place, and time. No cranial nerve deficit.  Skin: Skin is warm and dry.  Psychiatric: She has a normal mood and affect. Her behavior is normal. Judgment and thought content normal.  Nursing note and vitals reviewed.         Assessment & Plan:   Gastroesophageal reflux disease, esophagitis presence not specified No Follow-up on file.

## 2014-10-10 NOTE — Assessment & Plan Note (Signed)
>  25 minutes spent in face to face time with patient, >50% spent in counselling or coordination of care I tried to answer all the questions I could and advised her to start taking Trazodone along with zantac and dexilant again.  If she is not sleeping well and having worsening reflux, it will only worsen her malaise. Offered her support.  She has appointment to follow up reflux concerns with Dr. Hilarie Fredrickson next month.

## 2014-10-10 NOTE — Progress Notes (Signed)
Pre visit review using our clinic review tool, if applicable. No additional management support is needed unless otherwise documented below in the visit note. 

## 2014-10-10 NOTE — Assessment & Plan Note (Signed)
Deteriorated likely due to acute stressors.  Advised her to NOT stop taking Effexor and to restart Trazodone.  She and Mr. [name] have been in psychotherapy.  Advised to continue this.

## 2014-10-18 ENCOUNTER — Ambulatory Visit: Payer: Self-pay | Admitting: Family Medicine

## 2014-11-06 ENCOUNTER — Other Ambulatory Visit: Payer: Self-pay | Admitting: Gastroenterology

## 2014-11-07 DIAGNOSIS — L814 Other melanin hyperpigmentation: Secondary | ICD-10-CM | POA: Diagnosis not present

## 2014-11-07 DIAGNOSIS — L720 Epidermal cyst: Secondary | ICD-10-CM | POA: Diagnosis not present

## 2014-11-07 DIAGNOSIS — D229 Melanocytic nevi, unspecified: Secondary | ICD-10-CM | POA: Diagnosis not present

## 2014-11-07 DIAGNOSIS — L820 Inflamed seborrheic keratosis: Secondary | ICD-10-CM | POA: Diagnosis not present

## 2014-11-07 DIAGNOSIS — L309 Dermatitis, unspecified: Secondary | ICD-10-CM | POA: Diagnosis not present

## 2014-11-07 DIAGNOSIS — L219 Seborrheic dermatitis, unspecified: Secondary | ICD-10-CM | POA: Diagnosis not present

## 2014-11-09 ENCOUNTER — Ambulatory Visit (INDEPENDENT_AMBULATORY_CARE_PROVIDER_SITE_OTHER): Payer: Medicare Other | Admitting: Internal Medicine

## 2014-11-09 ENCOUNTER — Encounter (INDEPENDENT_AMBULATORY_CARE_PROVIDER_SITE_OTHER): Payer: Self-pay

## 2014-11-09 ENCOUNTER — Encounter: Payer: Self-pay | Admitting: Internal Medicine

## 2014-11-09 VITALS — BP 110/66 | HR 74 | Resp 16 | Ht 63.98 in | Wt 167.0 lb

## 2014-11-09 DIAGNOSIS — K219 Gastro-esophageal reflux disease without esophagitis: Secondary | ICD-10-CM

## 2014-11-09 DIAGNOSIS — K5900 Constipation, unspecified: Secondary | ICD-10-CM | POA: Diagnosis not present

## 2014-11-09 DIAGNOSIS — K589 Irritable bowel syndrome without diarrhea: Secondary | ICD-10-CM | POA: Diagnosis not present

## 2014-11-09 DIAGNOSIS — Z86010 Personal history of colonic polyps: Secondary | ICD-10-CM | POA: Diagnosis not present

## 2014-11-09 DIAGNOSIS — K5909 Other constipation: Secondary | ICD-10-CM

## 2014-11-09 MED ORDER — LUBIPROSTONE 24 MCG PO CAPS: 24.0000 ug | ORAL_CAPSULE | Freq: Two times a day (BID) | ORAL | Status: DC

## 2014-11-09 MED ORDER — DEXLANSOPRAZOLE 60 MG PO CPDR: 1.0000 | DELAYED_RELEASE_CAPSULE | Freq: Every day | ORAL | Status: DC

## 2014-11-09 NOTE — Patient Instructions (Addendum)
We have sent the following medications to your pharmacy for you to pick up at your convenience: Amitiza and Dexilant 60 mg take 1 cap daily  Please discontinue taking Linzess and Miralax at this time  Please follow up in 3 months  We will request your records from Dr. Latrelle Dodrill [phone]  regarding your colonoscopy and will review for future plan

## 2014-11-09 NOTE — Progress Notes (Signed)
Patient ID: Jamie Matthews, female   DOB: [birth year], 74 y.o.   MRN: [id number] HPI: Jamie Matthews is a 74 year old female with past medical history of GERD, irritable bowel constipation, diverticulosis with history of diverticulitis, depression, anxiety, fibromyalgia who is seen to establish care regarding reflux and constipation. She is here alone today. She was previously seen by Dr. Deatra Ina but requested to switch to me as I take care of her husband. This is her first visit. Constipation continues to be her predominant complaint. It is associated with lower abdominal bloating and crampy abdominal pain. She is taking Linzess 290 g daily and has not found it to be very helpful. She is also using MiraLAX 17 g several days per week along with eating prunes and fiber one bars. She reports the constipation gets so severe that she develops nausea. Reflux is been mostly controlled when she is using Dexilant 60 mg daily. She admits to using it off and on and when she is off her reflux returns. She reports prior upper endoscopy with dilation for dysphagia. She reports dysphagia is not a major problem right now though occasionally she feels like she has to drink fluids to help solid foods passed her stomach. She denies early satiety. Overall appetite has been good. She does admit to being stressed over her husbands recent stroke. She has recently discussed this with her primary care doctor. She is using trazodone to help with sleep.  She reports previous colonoscopy performed by Dr. Latrelle Dodrill in [place] roughly 2012 or 2013. She recalls diverticulosis and one small polyp being removed.  Past Medical History  Diagnosis Date  . Urinary incontinence   . Depression   . GERD (gastroesophageal reflux disease)   . Diverticulitis   . Hyperlipidemia   . Fibromyalgia   . Pneumonia 11/18/12  . Internal hemorrhoids   . Belching   . Constipation   . Arthritis   . Anxiety     Past Surgical  History  Procedure Laterality Date  . Cholecystectomy    . Tonsillectomy  1947  . Bladder suspension  2004, 2012    Outpatient Prescriptions Prior to Visit  Medication Sig Dispense Refill  . BIOTIN 5000 PO Take by mouth.    . Calcium Carbonate Antacid (ANTACID PO) Take by mouth as needed.    . Ciclopirox 1 % shampoo USE AS DIRECTED 120 mL 0  . MAGNESIUM PO Take by mouth.    . memantine (NAMENDA) 10 MG tablet TAKE ONE TABLET TWICE A DAY 60 tablet 5  . Multiple Vitamins-Minerals (CENTRUM PO) Take one by mouth daily    . MYRBETRIQ 50 MG TB24 tablet TAKE 1 TABLET EVERY DAY 30 tablet 5  . pramoxine-hydrocortisone (ANALPRAM-HC) 1-1 % rectal cream Place rectally 2 (two) times daily. 30 g 0  . Propylene Glycol (SYSTANE BALANCE OP) Apply to eye as needed.    . traZODone (DESYREL) 50 MG tablet Take 0.5-1 tablets (25-50 mg total) by mouth at bedtime as needed for sleep. 30 tablet 3  . triamcinolone (NASACORT) 55 MCG/ACT nasal inhaler Place 2 sprays into the nose daily.    [name] venlafaxine XR (EFFEXOR-XR) 150 MG 24 hr capsule Take 1 capsule (150 mg total) by mouth daily. 30 capsule 5  . LINZESS 290 MCG CAPS capsule TAKE ONE CAPSULE DAILY 30 capsule 2  . polyethylene glycol powder (GLYCOLAX/MIRALAX) powder Take 17 g by mouth as needed.    [name] atorvastatin (LIPITOR) 20 MG tablet TAKE 1 TABLET EVERY DAY (Patient not  taking: Reported on 11/09/2014) 90 tablet 1  . diphenhydrAMINE (BENADRYL) 25 MG tablet Take 50 mg by mouth daily.     [name] Phenylephrine-Acetaminophen (TYLENOL SINUS CONGESTION/PAIN) 5-325 MG TABS Take 2 tablets by mouth as needed.    . ranitidine (ZANTAC) 150 MG tablet TAKE ONE TABLET TWICE A DAY 60 tablet 11  . DEXILANT 60 MG capsule TAKE ONE CAPSULE TWICE A DAY 60 capsule 11   No facility-administered medications prior to visit.    Allergies  Allergen Reactions  . Lipitor [Atorvastatin]     Itching   . Sulfa Antibiotics Rash    Family History  Problem Relation Age of Onset  . Cancer  Father   . Diabetes Father   . Heart disease Father   . Heart disease Mother     History  Substance Use Topics  . Smoking status: Never Smoker   . Smokeless tobacco: Never Used  . Alcohol Use: No    ROS: As per history of present illness, otherwise negative  BP 110/66 mmHg  Pulse 74  Resp 16  Ht 5' 3.98" (1.625 m)  Wt 167 lb (75.751 kg)  BMI 28.69 kg/m2 Constitutional: Well-developed and well-nourished. No distress. HEENT: Normocephalic and atraumatic. Oropharynx is clear and moist. No oropharyngeal exudate. Conjunctivae are normal.  No scleral icterus. Neck: Neck supple. Trachea midline. Cardiovascular: Normal rate, regular rhythm and intact distal pulses. No M/R/G Pulmonary/chest: Effort normal and breath sounds normal. No wheezing, rales or rhonchi. Abdominal: Soft, nontender, nondistended. Bowel sounds active throughout.  Extremities: no clubbing, cyanosis, or edema Lymphadenopathy: No cervical adenopathy noted. Neurological: Alert and oriented to person place and time. Skin: Skin is warm and dry. No rashes noted. Psychiatric: Normal mood and affect. Behavior is normal.  RELEVANT LABS AND IMAGING: CBC    Component Value Date/Time   WBC 9.9 06/12/2014 0518   WBC 4.9 09/07/2013 1113   RBC 3.95 06/12/2014 0518   RBC 3.73* 09/07/2013 1113   HGB 12.6 06/12/2014 0518   HGB 12.1 09/07/2013 1113   HCT 37.9 06/12/2014 0518   HCT 35.5* 09/07/2013 1113   PLT 204 06/12/2014 0518   PLT 218.0 09/07/2013 1113   MCV 96 06/12/2014 0518   MCV 95.1 09/07/2013 1113   MCH 31.9 06/12/2014 0518   MCHC 33.2 06/12/2014 0518   MCHC 34.2 09/07/2013 1113   RDW 13.0 06/12/2014 0518   RDW 13.6 09/07/2013 1113   LYMPHSABS 0.6* 06/12/2014 0518   LYMPHSABS 0.7 09/07/2013 1113   MONOABS 1.0* 06/12/2014 0518   MONOABS 0.7 09/07/2013 1113   EOSABS 0.1 06/12/2014 0518   EOSABS 0.2 09/07/2013 1113   BASOSABS 0.0 06/12/2014 0518   BASOSABS 0.0 09/07/2013 1113    CMP     Component  Value Date/Time   NA 137 06/22/2014 1222   NA 139 06/12/2014 0518   K 4.0 06/22/2014 1222   K 4.4 06/12/2014 0518   CL 104 06/22/2014 1222   CL 104 06/12/2014 0518   CO2 25 06/22/2014 1222   CO2 26 06/12/2014 0518   GLUCOSE 115* 06/22/2014 1222   GLUCOSE 93 06/12/2014 0518   BUN 23 06/22/2014 1222   BUN 17 06/12/2014 0518   CREATININE 0.87 06/22/2014 1222   CREATININE 0.80 06/12/2014 0518   CALCIUM 8.8 06/22/2014 1222   CALCIUM 8.2* 06/12/2014 0518   PROT 7.6 06/10/2014 1649   PROT 6.8 12/13/2013 1229   ALBUMIN 4.1 06/10/2014 1649   ALBUMIN 4.3 12/13/2013 1229   AST 32 06/10/2014 1649  AST 22 12/13/2013 1229   ALT 37 06/10/2014 1649   ALT 20 12/13/2013 1229   ALKPHOS 92 06/10/2014 1649   ALKPHOS 59 12/13/2013 1229   BILITOT 0.8 12/13/2013 1229   GFRNONAA >60 04/30/2013 2241   GFRAA >60 04/30/2013 2241   -Review of records colonoscopy 02/10/2011, to the cecum internal hemorrhoids no polyps seen  --EGD 2014 Dr. Shana Chute dilation 52 French, sessile gastric polyps felt to be fundic gland polyps  ASSESSMENT/PLAN: 74 year old female with past medical history of GERD, irritable bowel constipation, diverticulosis with history of diverticulitis, depression, anxiety, fibromyalgia who is seen to establish care regarding reflux and constipation.  1. Chronic constipation with IBS -- not responding to Linzess at maximum dose, discontinue Linzess. Begin lubiprostone 24 g twice daily. I asked her to call me if this is not helpful in my recommendation would likely be to add MiraLAX 17 g daily. She can continue dietary fiber supplementation.  2. GERD -- symptoms likely related to sporadic PPI use. I've encouraged her to resume Dexilant 60 mg each morning 30 minute as before breakfast. Recent endoscopy reviewed no evidence of Barrett's esophagus. Repeat EGD only as needed based on dysphagia symptoms and need for dilation  3. CRC screening -- no colon polyps seen at colonoscopy in  September 2012, she recalls history of colon polyps so would consider repeat exam in September 2017  Follow-up in 3 months, sooner if necessary    Cc:Jamie Passy, Md [address] [place], [place] [address]

## 2014-11-13 ENCOUNTER — Telehealth: Payer: Self-pay | Admitting: Internal Medicine

## 2014-11-13 MED ORDER — LUBIPROSTONE 8 MCG PO CAPS: 8.0000 ug | ORAL_CAPSULE | Freq: Two times a day (BID) | ORAL | Status: DC

## 2014-11-13 NOTE — Telephone Encounter (Signed)
Decrease to lubiprostone 8 g twice a day

## 2014-11-13 NOTE — Telephone Encounter (Signed)
Patient notified new rx sent

## 2014-11-13 NOTE — Telephone Encounter (Signed)
Patient reports excess gas and diarrhea with 24 mcg  amitiza.  "I had diarrhea all that day".  She only took one dose .  Dr. Hilarie Fredrickson please advise

## 2014-11-21 ENCOUNTER — Telehealth: Payer: Self-pay | Admitting: Family Medicine

## 2014-11-21 NOTE — Telephone Encounter (Signed)
Spoke to pt and provided immunization date for prevnar; only immunization showing to have been received in office

## 2014-11-21 NOTE — Telephone Encounter (Signed)
Pt needs to know what vaccines she has had and what the dates are for a [facility] [place] form.  Please call back at [phone]

## 2014-11-29 DIAGNOSIS — H1013 Acute atopic conjunctivitis, bilateral: Secondary | ICD-10-CM | POA: Diagnosis not present

## 2014-12-01 ENCOUNTER — Encounter: Payer: Self-pay | Admitting: Internal Medicine

## 2014-12-12 ENCOUNTER — Telehealth: Payer: Self-pay | Admitting: Internal Medicine

## 2014-12-12 NOTE — Telephone Encounter (Signed)
Left message for pt to call back  °

## 2014-12-14 NOTE — Telephone Encounter (Signed)
Patient reports that she has been taking [place], initially worked very well.  she went back to the 24 mcg when the 8 mcg stopped working.  She is asking for an alternative

## 2014-12-14 NOTE — Telephone Encounter (Signed)
Left message for patient to call back  

## 2014-12-14 NOTE — Telephone Encounter (Signed)
Would continue Amitiza 24 mcg BID and add MiraLax 17g daily back to her regimen

## 2014-12-15 NOTE — Telephone Encounter (Signed)
Left message for patient to call back  

## 2014-12-18 NOTE — Telephone Encounter (Signed)
Left message for patient to call back  

## 2014-12-21 DIAGNOSIS — L249 Irritant contact dermatitis, unspecified cause: Secondary | ICD-10-CM | POA: Diagnosis not present

## 2014-12-21 DIAGNOSIS — L720 Epidermal cyst: Secondary | ICD-10-CM | POA: Diagnosis not present

## 2014-12-21 DIAGNOSIS — L219 Seborrheic dermatitis, unspecified: Secondary | ICD-10-CM | POA: Diagnosis not present

## 2014-12-21 NOTE — Telephone Encounter (Signed)
Spoke with pt and she is aware.

## 2015-01-13 ENCOUNTER — Other Ambulatory Visit: Payer: Self-pay | Admitting: Family Medicine

## 2015-02-05 ENCOUNTER — Other Ambulatory Visit: Payer: Self-pay | Admitting: Family Medicine

## 2015-02-09 ENCOUNTER — Encounter: Payer: Self-pay | Admitting: Obstetrics & Gynecology

## 2015-02-09 ENCOUNTER — Ambulatory Visit (INDEPENDENT_AMBULATORY_CARE_PROVIDER_SITE_OTHER): Payer: Medicare Other | Admitting: Obstetrics & Gynecology

## 2015-02-09 VITALS — BP 96/63 | HR 66 | Resp 18 | Ht 64.5 in | Wt 167.0 lb

## 2015-02-09 DIAGNOSIS — R928 Other abnormal and inconclusive findings on diagnostic imaging of breast: Secondary | ICD-10-CM

## 2015-02-09 DIAGNOSIS — Z01419 Encounter for gynecological examination (general) (routine) without abnormal findings: Secondary | ICD-10-CM

## 2015-02-09 DIAGNOSIS — Z01411 Encounter for gynecological examination (general) (routine) with abnormal findings: Secondary | ICD-10-CM | POA: Diagnosis not present

## 2015-02-09 NOTE — Progress Notes (Signed)
   CLINIC ENCOUNTER NOTE  History:  74 y.o. PMP female here today for breast exam and pelvic exam.  Reports left breast pain and inversion of left nipple over the last month; has chronic right breast pain and small group of benign-appearing calcifications noted on prior imaging on the right breast.  She denies any abnormal vaginal discharge, bleeding, pelvic pain or other concerns.   Past Medical History  Diagnosis Date  . Urinary incontinence   . Depression   . GERD (gastroesophageal reflux disease)   . Diverticulitis   . Hyperlipidemia   . Fibromyalgia   . Pneumonia 11/18/12  . Internal hemorrhoids   . Belching   . Constipation   . Arthritis   . Anxiety     Past Surgical History  Procedure Laterality Date  . Cholecystectomy    . Tonsillectomy  1947  . Bladder suspension  2004, 2012    The following portions of the patient's history were reviewed and updated as appropriate: allergies, current medications, past family history, past medical history, past social history, past surgical history and problem list.   Health Maintenance:  Normal pap abut 10 years ago; no history of cervical dysplasia.  Mammogram with right calcifications in 09/21/14; follow up recommended six months after this study.  Review of Systems:  Pertinent items are noted in HPI. Comprehensive review of systems was otherwise negative.  Objective:  Physical Exam BP 96/63 mmHg  Pulse 66  Resp 18  Ht 5' 4.5" (1.638 m)  Wt 167 lb (75.751 kg)  BMI 28.23 kg/m2 CONSTITUTIONAL: Well-developed, well-nourished female in no acute distress.  HENT:  Normocephalic, atraumatic, External right and left ear normal. Oropharynx is clear and moist EYES: Conjunctivae and EOM are normal. Pupils are equal, round, and reactive to light. No scleral icterus.  NECK: Normal range of motion, supple, no masses.  Normal thyroid.  [place]: Alert and oriented to person, place, and time. Normal reflexes, muscle tone coordination. No  cranial nerve deficit noted. PSYCHIATRIC: Normal mood and affect. Normal behavior. Normal judgment and thought content. CARDIOVASCULAR: Normal heart rate noted RESPIRATORY: Effort and breath sounds normal, no problems with respiration noted. BREASTS: Symmetric in size. Partially inverted left nipple noted, some tenderness if left outer quadrant of breast.  Tenderness in right lower out quadrant.  No masses, other skin changes, nipple drainage, or lymphadenopathy bilaterally. ABDOMEN: Soft, no distention noted.  No tenderness, rebound or guarding.  PELVIC: Atrophic appearing external genitalia with small introitus; normal appearing vaginal mucosa and cervix.  Normal appearing discharge.   Normal uterine size, no other palpable masses, no uterine or adnexal tenderness. MUSCULOSKELETAL: Normal range of motion. No tenderness.  No cyanosis, clubbing, or edema.  2+ distal pulses.  Assessment & Plan:  Will follow up breast imaging results; diagnostic mammogram and bilateral ultrasounds ordered. Routine preventative health maintenance measures emphasized; follow up with PCP. Please refer to After Visit Summary for other counseling recommendations.     Verita Schneiders, MD, [place] Attending Obstetrician & Gynecologist, [place] for [facility]

## 2015-02-09 NOTE — Patient Instructions (Signed)
Return to clinic for any scheduled appointments or for any gynecologic concerns as needed.   

## 2015-02-14 DIAGNOSIS — H01136 Eczematous dermatitis of left eye, unspecified eyelid: Secondary | ICD-10-CM | POA: Diagnosis not present

## 2015-02-14 DIAGNOSIS — L219 Seborrheic dermatitis, unspecified: Secondary | ICD-10-CM | POA: Diagnosis not present

## 2015-02-14 DIAGNOSIS — L820 Inflamed seborrheic keratosis: Secondary | ICD-10-CM | POA: Diagnosis not present

## 2015-02-14 DIAGNOSIS — H01133 Eczematous dermatitis of right eye, unspecified eyelid: Secondary | ICD-10-CM | POA: Diagnosis not present

## 2015-02-14 DIAGNOSIS — L300 Nummular dermatitis: Secondary | ICD-10-CM | POA: Diagnosis not present

## 2015-02-14 DIAGNOSIS — R21 Rash and other nonspecific skin eruption: Secondary | ICD-10-CM | POA: Diagnosis not present

## 2015-02-15 ENCOUNTER — Encounter: Payer: Self-pay | Admitting: Family Medicine

## 2015-02-15 ENCOUNTER — Ambulatory Visit (INDEPENDENT_AMBULATORY_CARE_PROVIDER_SITE_OTHER): Payer: Medicare Other | Admitting: Family Medicine

## 2015-02-15 VITALS — BP 114/72 | HR 81 | Temp 98.0°F | Wt 167.0 lb

## 2015-02-15 DIAGNOSIS — K5900 Constipation, unspecified: Secondary | ICD-10-CM

## 2015-02-15 DIAGNOSIS — R5383 Other fatigue: Secondary | ICD-10-CM | POA: Diagnosis not present

## 2015-02-15 DIAGNOSIS — R140 Abdominal distension (gaseous): Secondary | ICD-10-CM | POA: Diagnosis not present

## 2015-02-15 DIAGNOSIS — Z23 Encounter for immunization: Secondary | ICD-10-CM

## 2015-02-15 DIAGNOSIS — Z79899 Other long term (current) drug therapy: Secondary | ICD-10-CM | POA: Diagnosis not present

## 2015-02-15 DIAGNOSIS — K5909 Other constipation: Secondary | ICD-10-CM

## 2015-02-15 LAB — COMPREHENSIVE METABOLIC PANEL
ALT: 19 U/L (ref 0–35)
AST: 21 U/L (ref 0–37)
Albumin: 4.2 g/dL (ref 3.5–5.2)
Alkaline Phosphatase: 68 U/L (ref 39–117)
BILIRUBIN TOTAL: 0.4 mg/dL (ref 0.2–1.2)
BUN: 20 mg/dL (ref 6–23)
CALCIUM: 9.4 mg/dL (ref 8.4–10.5)
CO2: 31 mEq/L (ref 19–32)
CREATININE: 1.04 mg/dL (ref 0.40–1.20)
Chloride: 102 mEq/L (ref 96–112)
GFR: 54.69 mL/min — ABNORMAL LOW (ref >60.00)
GLUCOSE: 93 mg/dL (ref 70–99)
Potassium: 4.6 mEq/L (ref 3.5–5.1)
Sodium: 141 mEq/L (ref 135–145)
Total Protein: 7.1 g/dL (ref 6.0–8.3)

## 2015-02-15 LAB — CBC WITH DIFFERENTIAL/PLATELET
Basophils Absolute: 0 10*3/uL (ref 0.0–0.1)
Basophils Relative: 0.1 % (ref 0.0–3.0)
EOS PCT: 4.9 % (ref 0.0–5.0)
Eosinophils Absolute: 0.3 10*3/uL (ref 0.0–0.7)
HCT: 36.3 % (ref 36.0–46.0)
Hemoglobin: 12.2 g/dL (ref 12.0–15.0)
LYMPHS ABS: 0.9 10*3/uL (ref 0.7–4.0)
Lymphocytes Relative: 15 % (ref 12.0–46.0)
MCHC: 33.6 g/dL (ref 30.0–36.0)
MCV: 94.9 fl (ref 78.0–100.0)
MONO ABS: 0.8 10*3/uL (ref 0.1–1.0)
Monocytes Relative: 13 % — ABNORMAL HIGH (ref 3.0–12.0)
NEUTROS PCT: 67 % (ref 43.0–77.0)
Neutro Abs: 3.9 10*3/uL (ref 1.4–7.7)
Platelets: 230 10*3/uL (ref 150.0–400.0)
RBC: 3.83 Mil/uL — ABNORMAL LOW (ref 3.87–5.11)
RDW: 13.8 % (ref 11.5–15.5)
WBC: 5.9 10*3/uL (ref 4.0–10.5)

## 2015-02-15 LAB — VITAMIN B12: Vitamin B-12: 656 pg/mL (ref 211–911)

## 2015-02-15 LAB — TSH: TSH: 2.32 u[IU]/mL (ref 0.35–4.50)

## 2015-02-15 LAB — VITAMIN D 25 HYDROXY (VIT D DEFICIENCY, FRACTURES): VITD: 35.13 ng/mL (ref 30.00–100.00)

## 2015-02-15 NOTE — Assessment & Plan Note (Signed)
Likely multifactorial. Labs today. The patient indicates understanding of these issues and agrees with the plan. Orders Placed This Encounter  Procedures  . Flu Vaccine QUAD 36+ mos PF IM (Fluarix & Fluzone Quad PF)  . TSH  . Comprehensive metabolic panel  . Vitamin D, 25-hydroxy  . Vitamin B12  . CBC with Differential/Platelet

## 2015-02-15 NOTE — Assessment & Plan Note (Signed)
Likely due to miralax. Advised to cut back to every other day usage, push water, stop eating fiber one bars. Call or return to clinic prn if these symptoms worsen or fail to improve as anticipated. The patient indicates understanding of these issues and agrees with the plan.

## 2015-02-15 NOTE — Progress Notes (Signed)
Subjective:   Patient ID: Jamie Matthews, female    DOB: 1940/07/21, 74 y.o.   MRN: [id number]  Gwendlyon Zumbro is a pleasant 74 y.o. year old female who presents to clinic today with her husband for Constipation and Fatigue  on 02/15/2015  HPI:  H/o chronic constipation- being managed by Dr. Hilarie Fredrickson. In 11/2014, miralax 17 daily was added to Amitiza 24 mcg twice daily. This has been working well for her constipation but now very gasy and bloated.  Did not take miralax last night and feels a little better today. No blood in stool.  No fever.  No nausea or vomiting. No abdominal pain.    Fatigue- progressive fatigue for past several months.  No CP.  No DOE.  Denies feeling depressed.  Current Outpatient Prescriptions on File Prior to Visit  Medication Sig Dispense Refill  . atorvastatin (LIPITOR) 20 MG tablet TAKE 1 TABLET EVERY DAY 90 tablet 1  . BIOTIN 5000 PO Take by mouth.    . Calcium Carbonate Antacid (ANTACID PO) Take by mouth as needed.    . Ciclopirox 1 % shampoo USE AS DIRECTED 120 mL 0  . dexlansoprazole (DEXILANT) 60 MG capsule Take 1 capsule (60 mg total) by mouth daily. 30 capsule 3  . diphenhydrAMINE (BENADRYL) 25 MG tablet Take 50 mg by mouth daily.     [name] lubiprostone (AMITIZA) 8 MCG capsule Take 1 capsule (8 mcg total) by mouth 2 (two) times daily with a meal. 60 capsule 1  . MAGNESIUM PO Take by mouth.    . memantine (NAMENDA) 10 MG tablet TAKE ONE TABLET TWICE A DAY 60 tablet 5  . Multiple Vitamins-Minerals (CENTRUM PO) Take one by mouth daily    . MYRBETRIQ 50 MG TB24 tablet TAKE 1 TABLET EVERY DAY 30 tablet 3  . Phenylephrine-Acetaminophen (TYLENOL SINUS CONGESTION/PAIN) 5-325 MG TABS Take 2 tablets by mouth as needed.    . pramoxine-hydrocortisone (ANALPRAM-HC) 1-1 % rectal cream Place rectally 2 (two) times daily. 30 g 0  . Propylene Glycol (SYSTANE BALANCE OP) Apply to eye as needed.    . ranitidine (ZANTAC) 150 MG tablet TAKE ONE TABLET  TWICE A DAY 60 tablet 11  . Sodium Chloride-Sodium Bicarb (AYR SALINE NASAL RINSE NA) Place into the nose.    . traZODone (DESYREL) 50 MG tablet Take 0.5-1 tablets (25-50 mg total) by mouth at bedtime as needed for sleep. 30 tablet 3  . triamcinolone (NASACORT) 55 MCG/ACT nasal inhaler Place 2 sprays into the nose daily.    [name] venlafaxine XR (EFFEXOR-XR) 150 MG 24 hr capsule TAKE ONE CAPSULE DAILY 30 capsule 5   No current facility-administered medications on file prior to visit.    Allergies  Allergen Reactions  . Lipitor [Atorvastatin]     Itching   . Sulfa Antibiotics Rash    Past Medical History  Diagnosis Date  . Urinary incontinence   . Depression   . GERD (gastroesophageal reflux disease)   . Diverticulitis   . Hyperlipidemia   . Fibromyalgia   . Pneumonia 11/18/12  . Internal hemorrhoids   . Belching   . Constipation   . Arthritis   . Anxiety     Past Surgical History  Procedure Laterality Date  . Cholecystectomy    . Tonsillectomy  1947  . Bladder suspension  2004, 2012    Family History  Problem Relation Age of Onset  . Cancer Father   . Diabetes Father   . Heart disease Father   .  Heart disease Mother     Social History   Social History  . Marital Status: Married    Spouse Name: N/A  . Number of Children: N/A  . Years of Education: N/A   Occupational History  . Retired    Social History Main Topics  . Smoking status: Never Smoker   . Smokeless tobacco: Never Used  . Alcohol Use: No  . Drug Use: No  . Sexual Activity: Not Currently   Other Topics Concern  . Not on file   Social History Narrative   Recently moved with her husband to [place] from [place].   Husband is a retired Pharmacist, community.   No children.   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   The PMH, PSH, Social History, Family History, Medications, and allergies have been reviewed in [facility], and have been updated if relevant.   Review of Systems  Constitutional: Positive  for fatigue. Negative for fever and unexpected weight change.  HENT: Negative for congestion, postnasal drip, rhinorrhea, sinus pressure and trouble swallowing.   Respiratory: Negative for cough, choking, shortness of breath, wheezing and stridor.   Cardiovascular: Negative.   Gastrointestinal: Positive for constipation and abdominal distention. Negative for nausea, vomiting, blood in stool, anal bleeding and rectal pain.  Genitourinary: Negative.   Musculoskeletal: Negative.   Skin: Negative.   Neurological: Negative.   Hematological: Negative.   All other systems reviewed and are negative.      Objective:    BP 114/72 mmHg  Pulse 81  Temp(Src) 98 F (36.7 C) (Oral)  Wt 167 lb (75.751 kg)  SpO2 97% Wt Readings from Last 3 Encounters:  02/15/15 167 lb (75.751 kg)  02/09/15 167 lb (75.751 kg)  11/09/14 167 lb (75.751 kg)     Physical Exam  Constitutional: She is oriented to person, place, and time. She appears well-developed and well-nourished. No distress.  HENT:  Head: Normocephalic.  Eyes: Conjunctivae are normal.  Neck: Normal range of motion.  Cardiovascular: Normal rate.   Pulmonary/Chest: Effort normal and breath sounds normal. No respiratory distress. She has no wheezes. She has no rales. She exhibits no tenderness.  Abdominal: Soft. Bowel sounds are normal. She exhibits no distension and no mass. There is no tenderness. There is no rebound and no guarding.  Musculoskeletal: Normal range of motion.  Neurological: She is alert and oriented to person, place, and time. No cranial nerve deficit.  Skin: Skin is warm and dry.  Psychiatric: She has a normal mood and affect. Her behavior is normal. Judgment and thought content normal.  Nursing note and vitals reviewed.         Assessment & Plan:   Other fatigue - Plan: TSH, Comprehensive metabolic panel, Vitamin D, 25-hydroxy, Vitamin B12, CBC with Differential/Platelet  Need for influenza vaccination - Plan: Flu  Vaccine QUAD 36+ mos PF IM (Fluarix & Fluzone Quad PF)  Chronic constipation No Follow-up on file.

## 2015-02-15 NOTE — Progress Notes (Signed)
Pre visit review using our clinic review tool, if applicable. No additional management support is needed unless otherwise documented below in the visit note. 

## 2015-02-19 ENCOUNTER — Encounter: Payer: Self-pay | Admitting: Internal Medicine

## 2015-02-19 ENCOUNTER — Ambulatory Visit (INDEPENDENT_AMBULATORY_CARE_PROVIDER_SITE_OTHER): Payer: Medicare Other | Admitting: Internal Medicine

## 2015-02-19 VITALS — BP 134/68 | HR 78 | Temp 98.8°F | Wt 167.0 lb

## 2015-02-19 DIAGNOSIS — J069 Acute upper respiratory infection, unspecified: Secondary | ICD-10-CM

## 2015-02-19 DIAGNOSIS — B9789 Other viral agents as the cause of diseases classified elsewhere: Principal | ICD-10-CM

## 2015-02-19 MED ORDER — HYDROCODONE-HOMATROPINE 5-1.5 MG/5ML PO SYRP: 5.0000 mL | ORAL_SOLUTION | Freq: Three times a day (TID) | ORAL | Status: DC | PRN

## 2015-02-19 NOTE — Patient Instructions (Signed)
Upper Respiratory Infection, Adult An upper respiratory infection (URI) is also sometimes known as the common cold. The upper respiratory tract includes the nose, sinuses, throat, trachea, and bronchi. Bronchi are the airways leading to the lungs. Most people improve within 1 week, but symptoms can last up to 2 weeks. A residual cough may last even longer.  CAUSES Many different viruses can infect the tissues lining the upper respiratory tract. The tissues become irritated and inflamed and often become very moist. Mucus production is also common. A cold is contagious. You can easily spread the virus to others by oral contact. This includes kissing, sharing a glass, coughing, or sneezing. Touching your mouth or nose and then touching a surface, which is then touched by another person, can also spread the virus. SYMPTOMS  Symptoms typically develop 1 to 3 days after you come in contact with a cold virus. Symptoms vary from person to person. They may include:  Runny nose.  Sneezing.  Nasal congestion.  Sinus irritation.  Sore throat.  Loss of voice (laryngitis).  Cough.  Fatigue.  Muscle aches.  Loss of appetite.  Headache.  Low-grade fever. DIAGNOSIS  You might diagnose your own cold based on familiar symptoms, since most people get a cold 2 to 3 times a year. Your caregiver can confirm this based on your exam. Most importantly, your caregiver can check that your symptoms are not due to another disease such as strep throat, sinusitis, pneumonia, asthma, or epiglottitis. Blood tests, throat tests, and X-rays are not necessary to diagnose a common cold, but they may sometimes be helpful in excluding other more serious diseases. Your caregiver will decide if any further tests are required. RISKS AND COMPLICATIONS  You may be at risk for a more severe case of the common cold if you smoke cigarettes, have chronic heart disease (such as heart failure) or lung disease (such as asthma), or if  you have a weakened immune system. The very young and very old are also at risk for more serious infections. Bacterial sinusitis, middle ear infections, and bacterial pneumonia can complicate the common cold. The common cold can worsen asthma and chronic obstructive pulmonary disease (COPD). Sometimes, these complications can require emergency medical care and may be life-threatening. PREVENTION  The best way to protect against getting a cold is to practice good hygiene. Avoid oral or hand contact with people with cold symptoms. Wash your hands often if contact occurs. There is no clear evidence that vitamin C, vitamin E, echinacea, or exercise reduces the chance of developing a cold. However, it is always recommended to get plenty of rest and practice good nutrition. TREATMENT  Treatment is directed at relieving symptoms. There is no cure. Antibiotics are not effective, because the infection is caused by a virus, not by bacteria. Treatment may include:  Increased fluid intake. Sports drinks offer valuable electrolytes, sugars, and fluids.  Breathing heated mist or steam (vaporizer or shower).  Eating chicken soup or other clear broths, and maintaining good nutrition.  Getting plenty of rest.  Using gargles or lozenges for comfort.  Controlling fevers with ibuprofen or acetaminophen as directed by your caregiver.  Increasing usage of your inhaler if you have asthma. Zinc gel and zinc lozenges, taken in the first 24 hours of the common cold, can shorten the duration and lessen the severity of symptoms. Pain medicines may help with fever, muscle aches, and throat pain. A variety of non-prescription medicines are available to treat congestion and runny nose. Your caregiver   can make recommendations and may suggest nasal or lung inhalers for other symptoms.  HOME CARE INSTRUCTIONS   Only take over-the-counter or prescription medicines for pain, discomfort, or fever as directed by your  caregiver.  Use a warm mist humidifier or inhale steam from a shower to increase air moisture. This may keep secretions moist and make it easier to breathe.  Drink enough water and fluids to keep your urine clear or pale yellow.  Rest as needed.  Return to work when your temperature has returned to normal or as your caregiver advises. You may need to stay home longer to avoid infecting others. You can also use a face mask and careful hand washing to prevent spread of the virus. SEEK MEDICAL CARE IF:   After the first few days, you feel you are getting worse rather than better.  You need your caregiver's advice about medicines to control symptoms.  You develop chills, worsening shortness of breath, or brown or red sputum. These may be signs of pneumonia.  You develop yellow or brown nasal discharge or pain in the face, especially when you bend forward. These may be signs of sinusitis.  You develop a fever, swollen neck glands, pain with swallowing, or white areas in the back of your throat. These may be signs of strep throat. SEEK IMMEDIATE MEDICAL CARE IF:   You have a fever.  You develop severe or persistent headache, ear pain, sinus pain, or chest pain.  You develop wheezing, a prolonged cough, cough up blood, or have a change in your usual mucus (if you have chronic lung disease).  You develop sore muscles or a stiff neck. Document Released: 11/05/2000 Document Revised: 08/04/2011 Document Reviewed: 08/17/2013 [employer] Patient Information 2015 [employer]. This information is not intended to replace advice given to you by your health care provider. Make sure you discuss any questions you have with your health care provider.  

## 2015-02-19 NOTE — Progress Notes (Signed)
HPI  Pt presents to the clinic today with c/o fatigue, runny nose, and cough. This started 4 days ago. She is blowing clear mucous out of her nose. The cough is productive of clear mucous. She denies fever, chills, body aches or shortness of breath. She is taking Tylenol without any relief. She did get her flu shot the day before her symptoms started and thinks that is related. She does have some seasonal allergies but reports this feels different. She denies breathing problems. She has not had sick contacts.  Review of Systems      Past Medical History  Diagnosis Date  . Urinary incontinence   . Depression   . GERD (gastroesophageal reflux disease)   . Diverticulitis   . Hyperlipidemia   . Fibromyalgia   . Pneumonia 11/18/12  . Internal hemorrhoids   . Belching   . Constipation   . Arthritis   . Anxiety     Family History  Problem Relation Age of Onset  . Cancer Father   . Diabetes Father   . Heart disease Father   . Heart disease Mother     Social History   Social History  . Marital Status: Married    Spouse Name: N/A  . Number of Children: N/A  . Years of Education: N/A   Occupational History  . Retired    Social History Main Topics  . Smoking status: Never Smoker   . Smokeless tobacco: Never Used  . Alcohol Use: No  . Drug Use: No  . Sexual Activity: Not Currently   Other Topics Concern  . Not on file   Social History Narrative   Recently moved with her husband to [place] from [place].   Husband is a retired Pharmacist, community.   No children.   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    Allergies  Allergen Reactions  . Lipitor [Atorvastatin]     Itching   . Sulfa Antibiotics Rash     Constitutional: Positive headache, fatigueDenies fever or abrupt weight changes.  HEENT:  Positive runny nose. Denies eye redness, eye pain, pressure behind the eyes, facial pain, nasal congestion, ear pain, ringing in the ears, wax buildup or sore  throat. Respiratory: Positive cough. Denies difficulty breathing or shortness of breath.  Cardiovascular: Denies chest pain, chest tightness, palpitations or swelling in the hands or feet.   No other specific complaints in a complete review of systems (except as listed in HPI above).  Objective:   BP 134/68 mmHg  Pulse 78  Temp(Src) 98.8 F (37.1 C) (Oral)  Wt 167 lb (75.751 kg)  SpO2 97% Wt Readings from Last 3 Encounters:  02/19/15 167 lb (75.751 kg)  02/15/15 167 lb (75.751 kg)  02/09/15 167 lb (75.751 kg)     General: Appears her stated age,  in NAD. HEENT: Head: normal shape and size, no sinus tenderness noted; Eyes: sclera white, no icterus, conjunctiva pink; Ears: Tm's gray and intact, normal light reflex; Nose: mucosa pink and moist, septum midline; Throat/Mouth: + PND. Teeth present, mucosa pink and moist, no exudate noted, no lesions or ulcerations noted.  Neck: No cervical lymphadenopathy.  Cardiovascular: Normal rate and rhythm. S1,S2 noted.  No murmur, rubs or gallops noted.  Pulmonary/Chest: Normal effort and positive vesicular breath sounds. No respiratory distress. No wheezes, rales or ronchi noted.      Assessment & Plan:   Viral Upper Respiratory Infection with Cough:  Get some rest and  drink plenty of water Nasocort for runny nose Tylenol as needed Rx for Hycodan cough syrup Return precautions given She is leaving town on Saturday for a 8 day trip to [place]. Advised her to call me by Thursday if not better, will call in [place]  RTC as needed or if symptoms persist.

## 2015-02-19 NOTE — Progress Notes (Signed)
Pre visit review using our clinic review tool, if applicable. No additional management support is needed unless otherwise documented below in the visit note. 

## 2015-02-26 ENCOUNTER — Other Ambulatory Visit: Payer: Self-pay | Admitting: *Deleted

## 2015-02-26 MED ORDER — ATORVASTATIN CALCIUM 20 MG PO TABS: 20.0000 mg | ORAL_TABLET | Freq: Every day | ORAL | Status: DC

## 2015-03-06 DIAGNOSIS — M170 Bilateral primary osteoarthritis of knee: Secondary | ICD-10-CM | POA: Diagnosis not present

## 2015-03-06 DIAGNOSIS — M25561 Pain in right knee: Secondary | ICD-10-CM | POA: Diagnosis not present

## 2015-03-06 DIAGNOSIS — M25562 Pain in left knee: Secondary | ICD-10-CM | POA: Diagnosis not present

## 2015-03-09 ENCOUNTER — Encounter: Payer: Self-pay | Admitting: Family Medicine

## 2015-03-09 ENCOUNTER — Ambulatory Visit (INDEPENDENT_AMBULATORY_CARE_PROVIDER_SITE_OTHER): Payer: Medicare Other | Admitting: Family Medicine

## 2015-03-09 VITALS — BP 109/87 | HR 64 | Temp 99.0°F | Ht 64.5 in | Wt 166.1 lb

## 2015-03-09 DIAGNOSIS — R059 Cough, unspecified: Secondary | ICD-10-CM | POA: Insufficient documentation

## 2015-03-09 DIAGNOSIS — R05 Cough: Secondary | ICD-10-CM | POA: Insufficient documentation

## 2015-03-09 MED ORDER — AZITHROMYCIN 250 MG PO TABS: ORAL_TABLET | ORAL | Status: DC

## 2015-03-09 MED ORDER — HYDROCODONE-HOMATROPINE 5-1.5 MG/5ML PO SYRP: 5.0000 mL | ORAL_SOLUTION | Freq: Every evening | ORAL | Status: DC | PRN

## 2015-03-09 NOTE — Progress Notes (Signed)
   Subjective:    Patient ID: Jamie Matthews, female    DOB: 02/07/1941, 74 y.o.   MRN: [id number]  Cough This is a new problem. The current episode started 1 to 4 weeks ago (started after flu shot). The problem has been gradually worsening. The cough is productive of purulent sputum. Associated symptoms include nasal congestion and postnasal drip. Pertinent negatives include no ear congestion, ear pain, fever, headaches, myalgias, sore throat, shortness of breath or wheezing. The symptoms are aggravated by lying down. Risk factors: nonsmoker. She has tried prescription cough suppressant (tyelnol, mucinex) for the symptoms. The treatment provided mild relief. There is no history of asthma, COPD or environmental allergies.      Review of Systems  Constitutional: Negative for fever.  HENT: Positive for postnasal drip. Negative for ear pain and sore throat.   Respiratory: Positive for cough. Negative for shortness of breath and wheezing.   Musculoskeletal: Negative for myalgias.  Allergic/Immunologic: Negative for environmental allergies.  Neurological: Negative for headaches.       Objective:   Physical Exam  Constitutional: Vital signs are normal. She appears well-developed and well-nourished. She is cooperative.  Non-toxic appearance. She does not appear ill. No distress.  HENT:  Head: Normocephalic.  Right Ear: Hearing, tympanic membrane, external ear and ear canal normal. Tympanic membrane is not erythematous, not retracted and not bulging.  Left Ear: Hearing, tympanic membrane, external ear and ear canal normal. Tympanic membrane is not erythematous, not retracted and not bulging.  Nose: Mucosal edema and rhinorrhea present. Right sinus exhibits no maxillary sinus tenderness and no frontal sinus tenderness. Left sinus exhibits no maxillary sinus tenderness and no frontal sinus tenderness.  Mouth/Throat: Uvula is midline, oropharynx is clear and moist and mucous membranes  are normal.  Eyes: Conjunctivae, EOM and lids are normal. Pupils are equal, round, and reactive to light. Lids are everted and swept, no foreign bodies found.  Neck: Trachea normal and normal range of motion. Neck supple. Carotid bruit is not present. No thyroid mass and no thyromegaly present.  Cardiovascular: Normal rate, regular rhythm, S1 normal, S2 normal, normal heart sounds, intact distal pulses and normal pulses.  Exam reveals no gallop and no friction rub.   No murmur heard. Pulmonary/Chest: Effort normal and breath sounds normal. No tachypnea. No respiratory distress. She has no decreased breath sounds. She has no wheezes. She has no rhonchi. She has no rales.  Neurological: She is alert.  Skin: Skin is warm, dry and intact. No rash noted.  Psychiatric: Her speech is normal and behavior is normal. Judgment normal. Her mood appears not anxious. Cognition and memory are normal. She does not exhibit a depressed mood.          Assessment & Plan:

## 2015-03-09 NOTE — Patient Instructions (Addendum)
Mucinex DM for cough during the day.  Cough suppressant prescription at night.  Complete course of antibitoics.  Rest, fluids.  Call if not improving 7-10 days as expected.  Go to ER if severe shortness of breath.

## 2015-03-09 NOTE — Assessment & Plan Note (Signed)
>   2 weeks, productive purulent sputum.  Cover with antibiotics. Symtpomatic care.

## 2015-03-09 NOTE — Progress Notes (Signed)
Pre visit review using our clinic review tool, if applicable. No additional management support is needed unless otherwise documented below in the visit note. 

## 2015-03-16 ENCOUNTER — Other Ambulatory Visit: Payer: Self-pay | Admitting: Internal Medicine

## 2015-03-22 ENCOUNTER — Ambulatory Visit (INDEPENDENT_AMBULATORY_CARE_PROVIDER_SITE_OTHER): Payer: Medicare Other | Admitting: Family Medicine

## 2015-03-22 ENCOUNTER — Encounter: Payer: Self-pay | Admitting: Family Medicine

## 2015-03-22 VITALS — BP 128/64 | HR 62 | Temp 97.9°F | Wt 166.5 lb

## 2015-03-22 DIAGNOSIS — R140 Abdominal distension (gaseous): Secondary | ICD-10-CM | POA: Diagnosis not present

## 2015-03-22 DIAGNOSIS — R5383 Other fatigue: Secondary | ICD-10-CM

## 2015-03-22 DIAGNOSIS — F331 Major depressive disorder, recurrent, moderate: Secondary | ICD-10-CM | POA: Diagnosis not present

## 2015-03-22 DIAGNOSIS — K5900 Constipation, unspecified: Secondary | ICD-10-CM | POA: Diagnosis not present

## 2015-03-22 DIAGNOSIS — K5909 Other constipation: Secondary | ICD-10-CM

## 2015-03-22 LAB — H. PYLORI ANTIBODY, IGG: H Pylori IgG: NEGATIVE

## 2015-03-22 MED ORDER — BUPROPION HCL ER (XL) 150 MG PO TB24: 150.0000 mg | ORAL_TABLET | Freq: Every day | ORAL | Status: DC

## 2015-03-22 NOTE — Progress Notes (Signed)
Subjective:   Patient ID: Jamie Matthews, female    DOB: [birth year], 74 y.o.   MRN: [id number]  Marlaine Arey is a pleasant 74 y.o. year old female who presents to clinic today with her husband for Abdominal Pain  on 03/22/2015  HPI:  These are persistent complaints that she frequently comes to me for.  Last saw her on 02/15/2015 for all of these symptoms.  H/o chronic constipation- being managed by Dr. Hilarie Fredrickson. In 11/2014, miralax 17 daily was added to Amitiza 24 mcg twice daily. This has been working well for her constipation but sometimes gest very gasy and bloated.   No blood in stool.  No fever.  No nausea or vomiting. No abdominal pain.    Last month, I advised that she cut her miralax to every other day and to stop eating fiber one bars, push fluids.  When she does this, she does feel better.  Fatigue- chronic complaint.  No CP.  No DOE.  Denies feeling depressed.  Labs reassuring last month- including CMET, TSH, Vit D, Vit B12 and CBC.  Does have a h/o MDD.  Upon questioning, she thinks maybe she is "a little depressed."  Less interested in doing things she likes to do.  She's not sure if this is causing or part of her fatigue.  Currently taking effexor.  Stopped taking Wellbutrin last year- "felt I didn't need it." Current Outpatient Prescriptions on File Prior to Visit  Medication Sig Dispense Refill  . atorvastatin (LIPITOR) 20 MG tablet Take 1 tablet (20 mg total) by mouth daily. 30 tablet 0  . BIOTIN 5000 PO Take by mouth.    . Calcium Carbonate Antacid (ANTACID PO) Take by mouth as needed.    . Ciclopirox 1 % shampoo USE AS DIRECTED 120 mL 0  . DEXILANT 60 MG capsule TAKE ONE CAPSULE DAILY 30 capsule 0  . diphenhydrAMINE (BENADRYL) 25 MG tablet Take 50 mg by mouth daily.     [name] HYDROcodone-homatropine (HYCODAN) 5-1.5 MG/5ML syrup Take 5 mLs by mouth at bedtime as needed for cough. 120 mL 0  . lubiprostone (AMITIZA) 8 MCG capsule Take 1 capsule  (8 mcg total) by mouth 2 (two) times daily with a meal. 60 capsule 1  . MAGNESIUM PO Take by mouth.    . memantine (NAMENDA) 10 MG tablet TAKE ONE TABLET TWICE A DAY 60 tablet 5  . Multiple Vitamins-Minerals (CENTRUM PO) Take one by mouth daily    . MYRBETRIQ 50 MG TB24 tablet TAKE 1 TABLET EVERY DAY 30 tablet 3  . Propylene Glycol (SYSTANE BALANCE OP) Apply to eye as needed.    . ranitidine (ZANTAC) 150 MG tablet TAKE ONE TABLET TWICE A DAY 60 tablet 11  . Sodium Chloride-Sodium Bicarb (AYR SALINE NASAL RINSE NA) Place into the nose.    . traZODone (DESYREL) 50 MG tablet Take 0.5-1 tablets (25-50 mg total) by mouth at bedtime as needed for sleep. 30 tablet 3  . triamcinolone (NASACORT) 55 MCG/ACT nasal inhaler Place 2 sprays into the nose daily.    [name] venlafaxine XR (EFFEXOR-XR) 150 MG 24 hr capsule TAKE ONE CAPSULE DAILY 30 capsule 5   No current facility-administered medications on file prior to visit.    Allergies  Allergen Reactions  . Lipitor [Atorvastatin]     Itching   . Sulfa Antibiotics Rash    Past Medical History  Diagnosis Date  . Urinary incontinence   . Depression   . GERD (gastroesophageal reflux  disease)   . Diverticulitis   . Hyperlipidemia   . Fibromyalgia   . Pneumonia 11/18/12  . Internal hemorrhoids   . Belching   . Constipation   . Arthritis   . Anxiety     Past Surgical History  Procedure Laterality Date  . Cholecystectomy    . Tonsillectomy  1947  . Bladder suspension  2004, 2012    Family History  Problem Relation Age of Onset  . Cancer Father   . Diabetes Father   . Heart disease Father   . Heart disease Mother     Social History   Social History  . Marital Status: Married    Spouse Name: N/A  . Number of Children: N/A  . Years of Education: N/A   Occupational History  . Retired    Social History Main Topics  . Smoking status: Never Smoker   . Smokeless tobacco: Never Used  . Alcohol Use: No  . Drug Use: No  . Sexual  Activity: Not Currently   Other Topics Concern  . Not on file   Social History Narrative   Recently moved with her husband to [place] from [place].   Husband is a retired Pharmacist, community.   No children.   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   The PMH, PSH, Social History, Family History, Medications, and allergies have been reviewed in [facility], and have been updated if relevant.   Review of Systems  Constitutional: Positive for fatigue. Negative for fever and unexpected weight change.  HENT: Negative for congestion, postnasal drip, rhinorrhea, sinus pressure and trouble swallowing.   Respiratory: Negative for cough, choking, shortness of breath, wheezing and stridor.   Cardiovascular: Negative.   Gastrointestinal: Positive for constipation and abdominal distention. Negative for nausea, vomiting, blood in stool, anal bleeding and rectal pain.  Genitourinary: Negative.   Musculoskeletal: Negative.   Skin: Negative.   Neurological: Negative.   Hematological: Negative.   Psychiatric/Behavioral: Positive for dysphoric mood. Negative for suicidal ideas, hallucinations, behavioral problems, confusion, sleep disturbance, self-injury, decreased concentration and agitation. The patient is not nervous/anxious and is not hyperactive.   All other systems reviewed and are negative.      Objective:    BP 128/64 mmHg  Pulse 62  Temp(Src) 97.9 F (36.6 C) (Oral)  Wt 166 lb 8 oz (75.524 kg)  SpO2 96% Wt Readings from Last 3 Encounters:  03/22/15 166 lb 8 oz (75.524 kg)  03/09/15 166 lb 2 oz (75.354 kg)  02/19/15 167 lb (75.751 kg)     Physical Exam  Constitutional: She is oriented to person, place, and time. She appears well-developed and well-nourished. No distress.  HENT:  Head: Normocephalic.  Eyes: Conjunctivae are normal.  Neck: Normal range of motion.  Cardiovascular: Normal rate.   Pulmonary/Chest: Effort normal and breath sounds normal. No respiratory distress. She has no  wheezes. She has no rales. She exhibits no tenderness.  Abdominal: Soft. Bowel sounds are normal. She exhibits no distension and no mass. There is no tenderness. There is no rebound and no guarding.  Musculoskeletal: Normal range of motion.  Neurological: She is alert and oriented to person, place, and time. No cranial nerve deficit.  Skin: Skin is warm and dry.  Psychiatric: She has a normal mood and affect. Her behavior is normal. Judgment and thought content normal.  Nursing note and vitals reviewed.         Assessment & Plan:   Abdominal  bloating  Major depressive disorder, recurrent, moderate  Chronic constipation No Follow-up on file.

## 2015-03-22 NOTE — Assessment & Plan Note (Signed)
>  25 minutes spent in face to face time with patient, >50% spent in counselling or coordination of care discussing his bloating, constipation, depression.  Restart Wellbutrin 150 mg XL daily.  She will keep me updated with her symptoms.  H Pylori IGG today.  Continue current regimen for constipation.

## 2015-03-22 NOTE — Progress Notes (Signed)
Pre visit review using our clinic review tool, if applicable. No additional management support is needed unless otherwise documented below in the visit note. 

## 2015-03-26 ENCOUNTER — Ambulatory Visit
Admission: RE | Admit: 2015-03-26 | Discharge: 2015-03-26 | Disposition: A | Discharge status: Home / Self Care | Payer: Medicare Other | Source: Ambulatory Visit | Attending: Obstetrics & Gynecology | Admitting: Obstetrics & Gynecology

## 2015-03-26 ENCOUNTER — Other Ambulatory Visit: Payer: Self-pay | Admitting: Obstetrics & Gynecology

## 2015-03-26 DIAGNOSIS — R928 Other abnormal and inconclusive findings on diagnostic imaging of breast: Secondary | ICD-10-CM

## 2015-03-26 DIAGNOSIS — R921 Mammographic calcification found on diagnostic imaging of breast: Secondary | ICD-10-CM | POA: Insufficient documentation

## 2015-04-02 ENCOUNTER — Other Ambulatory Visit: Payer: Self-pay

## 2015-04-02 ENCOUNTER — Other Ambulatory Visit: Payer: Self-pay | Admitting: Internal Medicine

## 2015-04-02 MED ORDER — HYDROCODONE-HOMATROPINE 5-1.5 MG/5ML PO SYRP: 5.0000 mL | ORAL_SOLUTION | Freq: Every evening | ORAL | Status: DC | PRN

## 2015-04-02 NOTE — Telephone Encounter (Signed)
Lm on pts vm informing her Rx is available for pickup from the front desk 

## 2015-04-02 NOTE — Telephone Encounter (Signed)
Pt left v/m requesting rx hycodan for cough; call when ready for pick up. Pt seen and rx last printed # 120 ml on 03/09/15. Please advise.

## 2015-04-05 ENCOUNTER — Other Ambulatory Visit: Payer: Self-pay | Admitting: *Deleted

## 2015-04-05 MED ORDER — MEMANTINE HCL 10 MG PO TABS: 10.0000 mg | ORAL_TABLET | Freq: Two times a day (BID) | ORAL | Status: DC

## 2015-04-05 NOTE — Telephone Encounter (Signed)
Pt has not had f/u in 1+yrs; acute only. pls advise

## 2015-04-06 ENCOUNTER — Telehealth: Payer: Self-pay | Admitting: Internal Medicine

## 2015-04-06 MED ORDER — DEXLANSOPRAZOLE 60 MG PO CPDR: 1.0000 | DELAYED_RELEASE_CAPSULE | Freq: Every day | ORAL | Status: DC

## 2015-04-06 NOTE — Telephone Encounter (Signed)
Rx sent until 05/2015 appointment.

## 2015-04-11 ENCOUNTER — Encounter: Payer: Self-pay | Admitting: Family Medicine

## 2015-04-11 ENCOUNTER — Ambulatory Visit (INDEPENDENT_AMBULATORY_CARE_PROVIDER_SITE_OTHER): Payer: Medicare Other | Admitting: Family Medicine

## 2015-04-11 VITALS — BP 100/55 | HR 64 | Temp 97.9°F | Ht 64.5 in | Wt 166.0 lb

## 2015-04-11 DIAGNOSIS — M25551 Pain in right hip: Secondary | ICD-10-CM | POA: Diagnosis not present

## 2015-04-11 DIAGNOSIS — M1712 Unilateral primary osteoarthritis, left knee: Secondary | ICD-10-CM | POA: Diagnosis not present

## 2015-04-11 DIAGNOSIS — M7062 Trochanteric bursitis, left hip: Secondary | ICD-10-CM | POA: Diagnosis not present

## 2015-04-11 DIAGNOSIS — M7061 Trochanteric bursitis, right hip: Secondary | ICD-10-CM | POA: Diagnosis not present

## 2015-04-11 DIAGNOSIS — M25552 Pain in left hip: Secondary | ICD-10-CM

## 2015-04-11 MED ORDER — METHYLPREDNISOLONE ACETATE 40 MG/ML IJ SUSP
80.0000 mg | Freq: Once | INTRAMUSCULAR | Status: AC
  Administered 2015-04-11: 80 mg via INTRA_ARTICULAR

## 2015-04-11 NOTE — Progress Notes (Signed)
Dr. Frederico Hamman T. Ronnie Doo, MD, [facility] [place], [address] Phone: [phone] Fax: [phone]  04/11/2015  Patient: Jamie Matthews, MRN: [id number], DOB: 10/03/1940, 74 y.o.  Primary Physician:  Arnette Norris, MD   Chief Complaint  Patient presents with  . Hip Pain    Bilateral  . Knee Pain    Right   Subjective:   Jamie Matthews is a 74 y.o. very pleasant female patient who presents with the following:  B hip pain and L knee pain:  balance is bad, pain in both hips and also left knee pain. This is really been ongoing for some months.  He history below detail some of the struggles that have been happening in regards to her husband's recent stroke.  She is been involved in his care quite a bit more than she would be at baseline, and she has stopped working out entirely.  Husband had a stroked one year ago. Not getting enough sleep. Husband had a TIA on sturday, and that is also a lot of stress taking him to doctor appointments. No exercise at all - minimal since his stroke.   She is having a lot of pain on the lateral aspects of both of her hips.  She also is having some small amount of groin pain on the right hip, but this is less of an issue compared to the lateral pain.  She also is having some left-sided knee pain which is more significant compared to the right, but compared to the hips this is less of an issue.  B troch bursa inj  Past Medical History, Surgical History, Social History, Family History, Problem List, Medications, and Allergies have been reviewed and updated if relevant.  Patient Active Problem List   Diagnosis Date Noted  . Fatigue 02/15/2015  . Abdominal bloating 02/15/2015  . Stress due to illness of family member 10/10/2014  . Ileitis 06/22/2014  . Chronic insomnia 09/20/2013  . Allergic dermatitis 09/20/2013  . Emesis 07/08/2013  . DNR (do not resuscitate)  11/15/2012  . Chronic constipation 04/01/2012  . 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 Urinary incontinence   . Major depressive disorder, recurrent, moderate   . GERD (gastroesophageal reflux disease)   . Diverticulitis   . Hyperlipidemia     Past Medical History  Diagnosis Date  . Urinary incontinence   . Depression   . GERD (gastroesophageal reflux disease)   . Diverticulitis   . Hyperlipidemia   . Fibromyalgia   . Pneumonia 11/18/12  . Internal hemorrhoids   . Belching   . Constipation   . Arthritis   . Anxiety     Past Surgical History  Procedure Laterality Date  . Cholecystectomy    . Tonsillectomy  1947  . Bladder suspension  2004, 2012    Social History   Social History  . Marital Status: Married    Spouse Name: N/A  . Number of Children: N/A  . Years of Education: N/A   Occupational History  . Retired    Social History Main Topics  . Smoking status: Never Smoker   . Smokeless tobacco: Never Used  . Alcohol Use: No  . Drug Use: No  . Sexual Activity: Not Currently   Other Topics Concern  . Not on file   Social History Narrative   Recently moved with her husband to [place] from [place].   Husband is a retired Pharmacist, community.   No children.  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    Family History  Problem Relation Age of Onset  . Cancer Father   . Diabetes Father   . Heart disease Father   . Heart disease Mother   . Breast cancer Sister     Allergies  Allergen Reactions  . Lipitor [Atorvastatin]     Itching   . Sulfa Antibiotics Rash    Medication list reviewed and updated in full in [place].  GEN: No fevers, chills. Nontoxic. Primarily MSK c/o today. MSK: Detailed in the HPI GI: tolerating PO intake without difficulty Neuro: No numbness, parasthesias, or tingling associated. Otherwise the pertinent positives of the ROS are noted above.   Objective:   BP 100/55 mmHg  Pulse 64  Temp(Src) 97.9 F (36.6 C) (Oral)  Ht 5'  4.5" (1.638 m)  Wt 166 lb (75.297 kg)  BMI 28.06 kg/m2   GEN: WDWN, NAD, Non-toxic, Alert & Oriented x 3 HEENT: Atraumatic, Normocephalic.  Ears and Nose: No external deformity. EXTR: No clubbing/cyanosis/edema NEURO: Normal gait.  PSYCH: Normally interactive. Conversant. Not depressed or anxious appearing.  Calm demeanor.   HIP EXAM: SIDE: B ROM: Abduction, Flexion, Internal and External range of motion: mild restriction in abduction Pain with terminal IROM and EROM: none GTB: marked pain B SLR: NEG Knees: No effusion FABER: NT REVERSE FABER: NT, neg Piriformis: NT at direct palpation Str: flexion: 5/5 abduction: 5/5 adduction: 5/5 Strength testing non-tender  Knee:  L Gait: Normal heel toe pattern ROM: lacks 3 deg extension and flexion to 115 Effusion: neg Echymosis or edema: none Patellar tendon NT Painful PLICA: neg Patellar grind: negative Medial and lateral patellar facet loading: negative medial and lateral joint lines: mild Mcmurray's neg Flexion-pinch neg Varus and valgus stress: stable Lachman: neg Ant and Post drawer: neg Hip abduction, IR, ER: WNL Hip flexion str: 5/5 Hip abd: 5/5 Quad: 5/5 VMO atrophy:No Hamstring concentric and eccentric: 5/5   Radiology:  Assessment and Plan:   Trochanteric bursitis of both hips  Bilateral hip pain - Plan: methylPREDNISolone acetate (DEPO-MEDROL) injection 80 mg, methylPREDNISolone acetate (DEPO-MEDROL) injection 80 mg  Primary osteoarthritis of left knee  Exam c/w B GTB Possible R hip OA, but seems much less the issue B knee oa, L >R  I think that a lot of these issues stem from the patient's lack of activity since her husband's stroke.  I encouraged her to get back exercising including some exercise classes that can be done with sitting and to get into the pool.  She agrees.  Trochanteric Bursitis Injection, R Verbal consent obtained. Risks (including infection, potential atrophy), benefits, and  alternatives reviewed. Greater trochanter sterilely prepped with Chloraprep. Ethyl Chloride used for anesthesia. 8 cc of Lidocaine 1% injected with 2 mL of Depo-Medrol 40 mg into trochanteric bursa at area of maximal tenderness at greater trochanter. Needle taken to bone to troch bursa, flows easily. Bursa massaged. No bleeding and no complications. Decreased pain after injection. Needle: 22 gauge spinal needle   Trochanteric Bursitis Injection, L Verbal consent obtained. Risks (including infection, potential atrophy), benefits, and alternatives reviewed. Greater trochanter sterilely prepped with Chloraprep. Ethyl Chloride used for anesthesia. 8 cc of Lidocaine 1% injected with 2 mL of Depo-Medrol 40 mg into trochanteric bursa at area of maximal tenderness at greater trochanter. Needle taken to bone to troch bursa, flows easily. Bursa massaged. No bleeding and no complications. Decreased pain after injection. Needle: 22 gauge  spinal needle   Follow-up: No Follow-up on file.  Signed,  Maud Deed. Brittne Kawasaki, MD   Patient's Medications  New Prescriptions   No medications on file  Previous Medications   AMITIZA 24 MCG CAPSULE    Take 24 mcg by mouth 2 (two) times daily with a meal.    ATORVASTATIN (LIPITOR) 20 MG TABLET    Take 1 tablet (20 mg total) by mouth daily.   BIOTIN 5000 PO    Take by mouth.   BUPROPION (WELLBUTRIN XL) 150 MG 24 HR TABLET    Take 1 tablet (150 mg total) by mouth daily.   CALCIUM CARBONATE ANTACID (ANTACID PO)    Take by mouth as needed.   CICLOPIROX 1 % SHAMPOO    USE AS DIRECTED   DEXLANSOPRAZOLE (DEXILANT) 60 MG CAPSULE    Take 1 capsule (60 mg total) by mouth daily.   DIPHENHYDRAMINE (BENADRYL) 25 MG TABLET    Take 50 mg by mouth daily.    FLUOCINONIDE CREAM (LIDEX) 0.05 %       HYDROCODONE-HOMATROPINE (HYCODAN) 5-1.5 MG/5ML SYRUP    Take 5 mLs by mouth at bedtime as needed for cough.   MAGNESIUM PO    Take by mouth.   MEMANTINE (NAMENDA) 10 MG TABLET    Take 1  tablet (10 mg total) by mouth 2 (two) times daily.   MULTIPLE VITAMINS-MINERALS (CENTRUM PO)    Take one by mouth daily   MYRBETRIQ 50 MG TB24 TABLET    TAKE 1 TABLET EVERY DAY   PROPYLENE GLYCOL (SYSTANE BALANCE OP)    Apply to eye as needed.   RANITIDINE (ZANTAC) 150 MG TABLET    TAKE ONE TABLET TWICE A DAY   SODIUM CHLORIDE-SODIUM BICARB (AYR SALINE NASAL RINSE NA)    Place into the nose.   TRAZODONE (DESYREL) 50 MG TABLET    Take 0.5-1 tablets (25-50 mg total) by mouth at bedtime as needed for sleep.   TRIAMCINOLONE (NASACORT) 55 MCG/ACT NASAL INHALER    Place 2 sprays into the nose daily.   VENLAFAXINE XR (EFFEXOR-XR) 150 MG 24 HR CAPSULE    TAKE ONE CAPSULE DAILY  Modified Medications   No medications on file  Discontinued Medications   LUBIPROSTONE (AMITIZA) 8 MCG CAPSULE    Take 1 capsule (8 mcg total) by mouth 2 (two) times daily with a meal.

## 2015-04-11 NOTE — Progress Notes (Signed)
Pre visit review using our clinic review tool, if applicable. No additional management support is needed unless otherwise documented below in the visit note. 

## 2015-05-08 ENCOUNTER — Other Ambulatory Visit: Payer: Self-pay | Admitting: Internal Medicine

## 2015-05-16 ENCOUNTER — Encounter: Payer: Self-pay | Admitting: *Deleted

## 2015-05-27 HISTORY — PX: COLONOSCOPY: SHX174

## 2015-05-28 ENCOUNTER — Emergency Department: Payer: Medicare Other

## 2015-05-28 ENCOUNTER — Emergency Department
Admission: EM | Admit: 2015-05-28 | Discharge: 2015-05-28 | Disposition: A | Discharge status: Home / Self Care | Payer: Medicare Other | Attending: Emergency Medicine | Admitting: Emergency Medicine

## 2015-05-28 DIAGNOSIS — M1991 Primary osteoarthritis, unspecified site: Secondary | ICD-10-CM | POA: Diagnosis not present

## 2015-05-28 DIAGNOSIS — M25562 Pain in left knee: Secondary | ICD-10-CM | POA: Diagnosis not present

## 2015-05-28 DIAGNOSIS — M1990 Unspecified osteoarthritis, unspecified site: Secondary | ICD-10-CM

## 2015-05-28 DIAGNOSIS — M79605 Pain in left leg: Secondary | ICD-10-CM | POA: Diagnosis present

## 2015-05-28 DIAGNOSIS — Z79899 Other long term (current) drug therapy: Secondary | ICD-10-CM | POA: Insufficient documentation

## 2015-05-28 NOTE — ED Notes (Addendum)
Pt came to ED c/o left leg pain starting at the knee and up to her waist. Pt sts this pain started on Saturday and hurts to put any weight on it. Pt sts she had to buy a walker to move around. Pt denies any fall or injury to leg.

## 2015-05-28 NOTE — ED Provider Notes (Signed)
[facility] Emergency Department Provider Note  ____________________________________________  Time seen: On arrival  I have reviewed the triage vital signs and the nursing notes.   HISTORY  Chief Complaint Leg Pain    HPI Jamie Matthews is a 75 y.o. female who initially was with her husband who presented to the ED but while waiting she decided to check herself in as well for left leg pain. She reports on Saturday she had been feeling well and lie down to take a nap and when she awoke she had pain in her left knee and left calf and left thigh. She has been using a walker since then. Initially she was not having back pain but she reports using the walker has irritated her back somewhat. No abdominal pain     Past Medical History  Diagnosis Date  . Urinary incontinence   . Depression   . GERD (gastroesophageal reflux disease)   . Diverticulitis   . Hyperlipidemia   . Fibromyalgia   . Pneumonia 11/18/12  . Internal hemorrhoids   . Belching   . Constipation   . Arthritis   . Anxiety   . IBS (irritable bowel syndrome)     Patient Active Problem List   Diagnosis Date Noted  . Fatigue 02/15/2015  . Abdominal bloating 02/15/2015  . Stress due to illness of family member 10/10/2014  . Ileitis 06/22/2014  . Chronic insomnia 09/20/2013  . Allergic dermatitis 09/20/2013  . Emesis 07/08/2013  . DNR (do not resuscitate) 11/15/2012  . Chronic constipation 04/01/2012  . 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 Urinary incontinence   . Major depressive disorder, recurrent, moderate   . GERD (gastroesophageal reflux disease)   . Diverticulitis   . Hyperlipidemia     Past Surgical History  Procedure Laterality Date  . Cholecystectomy    . Tonsillectomy  1947  . Bladder suspension  2004, 2012    Current Outpatient Rx  Name  Route  Sig  Dispense  Refill  . AMITIZA 24 MCG capsule   Oral   Take 24 mcg by mouth 2 (two) times daily with a meal.             Dispense as written.   [name] AMITIZA 24 MCG capsule      TAKE ONE CAPSULE TWICE A DAY WITH MEALS   60 capsule   0   . atorvastatin (LIPITOR) 20 MG tablet   Oral   Take 1 tablet (20 mg total) by mouth daily.   30 tablet   0     Office visit with labs required for additional ref ...   . BIOTIN 5000 PO   Oral   Take by mouth.         [name] buPROPion (WELLBUTRIN XL) 150 MG 24 hr tablet   Oral   Take 1 tablet (150 mg total) by mouth daily.   30 tablet   3   . Calcium Carbonate Antacid (ANTACID PO)   Oral   Take by mouth as needed.         . Ciclopirox 1 % shampoo      USE AS DIRECTED   120 mL   0   . dexlansoprazole (DEXILANT) 60 MG capsule   Oral   Take 1 capsule (60 mg total) by mouth daily.   30 capsule   1     MUST KEEP 05/2015 OFFICE VISIT FOR FURTHER REFILLS   . diphenhydrAMINE (BENADRYL) 25 MG tablet   Oral  Take 50 mg by mouth daily.          . fluocinonide cream (LIDEX) 0.05 %               . HYDROcodone-homatropine (HYCODAN) 5-1.5 MG/5ML syrup   Oral   Take 5 mLs by mouth at bedtime as needed for cough.   120 mL   0   . MAGNESIUM PO   Oral   Take by mouth.         . memantine (NAMENDA) 10 MG tablet   Oral   Take 1 tablet (10 mg total) by mouth 2 (two) times daily.   60 tablet   3   . Multiple Vitamins-Minerals (CENTRUM PO)      Take one by mouth daily         . MYRBETRIQ 50 MG TB24 tablet      TAKE 1 TABLET EVERY DAY   30 tablet   3   . Propylene Glycol (SYSTANE BALANCE OP)   Ophthalmic   Apply to eye as needed.         . ranitidine (ZANTAC) 150 MG tablet      TAKE ONE TABLET TWICE A DAY   60 tablet   11   . Sodium Chloride-Sodium Bicarb (AYR SALINE NASAL RINSE NA)   Nasal   Place into the nose.         . traZODone (DESYREL) 50 MG tablet   Oral   Take 0.5-1 tablets (25-50 mg total) by mouth at bedtime as needed for sleep.   30 tablet   3   . triamcinolone (NASACORT) 55 MCG/ACT nasal inhaler    Nasal   Place 2 sprays into the nose daily.         [name] venlafaxine XR (EFFEXOR-XR) 150 MG 24 hr capsule      TAKE ONE CAPSULE DAILY   30 capsule   5     Allergies Lipitor and Sulfa antibiotics  Family History  Problem Relation Age of Onset  . Cancer Father   . Diabetes Father   . Heart disease Father   . Heart disease Mother   . Breast cancer Sister     Social History Social History  Substance Use Topics  . Smoking status: Never Smoker   . Smokeless tobacco: Never Used  . Alcohol Use: No    Review of Systems  Constitutional: Negative for fever. Eyes: Negative for visual changes. ENT: Negative for sore throat Cardiovascular: Negative for chest pain. Respiratory: Negative for shortness of breath. Recent travel Gastrointestinal: Negative for abdominal pain Genitourinary: Negative for dysuria. Musculoskeletal: Mild back pain Skin: Negative for pallor Neurological: Negative for headaches or focal weakness Psychiatric: No anxiety    ____________________________________________   PHYSICAL EXAM:  VITAL SIGNS: ED Triage Vitals  Enc Vitals Group     BP 05/28/15 0942 122/86 mmHg     Pulse Rate 05/28/15 0942 63     Resp 05/28/15 0942 16     Temp 05/28/15 0942 98.5 F (36.9 C)     Temp Source 05/28/15 0942 Oral     SpO2 05/28/15 0942 95 %     Weight 05/28/15 0942 168 lb (76.204 kg)     Height 05/28/15 0942 5' 4.5" (1.638 m)     Head Cir --      Peak Flow --      Pain Score 05/28/15 0949 5     Pain Loc --      Pain Edu? --  Excl. in [place]? --     Constitutional: Alert and oriented. Well appearing and in no distress. Eyes: Conjunctivae are normal.  ENT   Head: Normocephalic and atraumatic.   Mouth/Throat: Mucous membranes are moist. Cardiovascular: Normal rate, regular rhythm. Normal and symmetric distal pulses are present in all extremities. No murmurs, rubs, or gallops. Respiratory: Normal respiratory effort without tachypnea nor retractions.  Breath sounds are clear and equal bilaterally.  Gastrointestinal: Soft and non-tender in all quadrants. No distention. There is no CVA tenderness. Genitourinary: deferred Musculoskeletal: Nontender with normal range of motion in all extremities. No lower extremity tenderness nor edema. Lower cavity is well-perfused with normal pulses bilaterally, no calf swelling. Tenderness to palpation in the superior portion of the left knee although no effusion noted, ligaments are intact Neurologic:  Normal speech and language. No gross focal neurologic deficits are appreciated. Skin:  Skin is warm, dry and intact. No rash noted. Psychiatric: Mood and affect are normal. Patient exhibits appropriate insight and judgment.  ____________________________________________    LABS (pertinent positives/negatives)  Labs Reviewed - No data to display  ____________________________________________   EKG  None  ____________________________________________    RADIOLOGY I have personally reviewed any xrays that were ordered on this patient: X-ray shows tricompartmental arthritis  No DVT on ultrasound  ____________________________________________   PROCEDURES  Procedure(s) performed: none  Critical Care performed: none  ____________________________________________   INITIAL IMPRESSION / ASSESSMENT AND PLAN / ED COURSE  Pertinent labs & imaging results that were available during my care of the patient were reviewed by me and considered in my medical decision making (see chart for details).  Unclear cause of patient's left leg pain, we will check ultrasound for DVT and x-ray of knee  Ultrasound negative, x-ray consistent with arthritis. We will have her follow-up with orthopedics  ____________________________________________   FINAL CLINICAL IMPRESSION(S) / ED DIAGNOSES  Final diagnoses:  Arthritis     Lavonia Drafts, MD 05/28/15 1401

## 2015-05-28 NOTE — Discharge Instructions (Signed)

## 2015-05-28 NOTE — ED Notes (Signed)
Pt c/o pain in left leg. No swelling/redness noted.

## 2015-05-29 ENCOUNTER — Telehealth: Payer: Self-pay | Admitting: *Deleted

## 2015-05-29 NOTE — Telephone Encounter (Signed)
Patient was evaluated in the ED 05/28/15.

## 2015-05-29 NOTE — Telephone Encounter (Signed)
PLEASE NOTE: All timestamps contained within this report are represented as [place] Standard Time. CONFIDENTIALTY NOTICE: This fax transmission is intended only for the addressee. It contains information that is legally privileged, confidential or otherwise protected from use or disclosure. If you are not the intended recipient, you are strictly prohibited from reviewing, disclosing, copying using or disseminating any of this information or taking any action in reliance on or regarding this information. If you have received this fax in error, please notify us immediately by telephone so that we can arrange for its return to [place]. Phone: [phone], Toll-Free: [phone], Fax: [phone] Page: 1 of 1 Call Id: [id number] [place] Patient Name: GLENDER AUGUSTA EN Gender: Female DOB: 1940/11/27 Age: 75 Y 62 M 24 D Return Phone Number: [phone] (Primary), [phone] (Secondary) Address: City/State/Zip: [place] Client [place] Night - Client Client Site [place] Physician Arnette Norris Contact Type Call Call Type Triage / Clinical Relationship To Patient Self Return Phone Number [phone] (Secondary) Chief Complaint Leg Pain Initial Comment Caller states c/o left leg pain since Saturday Nurse Assessment Guidelines Guideline Title Affirmed Question Affirmed Notes Nurse Date/Time Eilene Ghazi Time) Disp. Time Eilene Ghazi Time) Disposition Final User 05/28/2015 8:51:10 AM Send To Clinical Follow Up Rich Brave, Amy 05/28/2015 10:01:19 AM Attempt made - message left Markus Daft RN, Sherre Poot 05/28/2015 10:02:10 AM FINAL ATTEMPT MADE - message left Yes Markus Daft, RN, Sherre Poot After Care Instructions Given Call Event Type User Date / Time Description

## 2015-05-30 DIAGNOSIS — M1712 Unilateral primary osteoarthritis, left knee: Secondary | ICD-10-CM | POA: Diagnosis not present

## 2015-05-31 ENCOUNTER — Telehealth: Payer: Self-pay | Admitting: Family Medicine

## 2015-05-31 ENCOUNTER — Other Ambulatory Visit: Payer: Self-pay | Admitting: Unknown Physician Specialty

## 2015-05-31 DIAGNOSIS — M1712 Unilateral primary osteoarthritis, left knee: Secondary | ICD-10-CM

## 2015-05-31 NOTE — Telephone Encounter (Signed)
Jamie Matthews, is this one of those situations that a specialist wants PCP to order MRI before she will be seen?  Please get more information.  Thanks!

## 2015-05-31 NOTE — Telephone Encounter (Signed)
Pt went to Dr Leanor Kail at [facility] yesterday and they suggested that she have  a MRI  cb number [phone] Thank you

## 2015-05-31 NOTE — Telephone Encounter (Signed)
Called [name], they are going to schedule the patient for her MRI. I asked them to send you Dr Nancie Neas note, they will fax it to you. Tried to call the patient back as [name] said they will be calling her.

## 2015-06-01 ENCOUNTER — Ambulatory Visit: Payer: Self-pay | Admitting: Internal Medicine

## 2015-06-01 NOTE — Telephone Encounter (Signed)
Thank you :)

## 2015-06-11 ENCOUNTER — Encounter: Payer: Self-pay | Admitting: Internal Medicine

## 2015-06-11 ENCOUNTER — Ambulatory Visit (INDEPENDENT_AMBULATORY_CARE_PROVIDER_SITE_OTHER): Payer: Medicare Other | Admitting: Internal Medicine

## 2015-06-11 VITALS — BP 120/60 | HR 68 | Ht 63.0 in | Wt 169.0 lb

## 2015-06-11 DIAGNOSIS — K5900 Constipation, unspecified: Secondary | ICD-10-CM | POA: Diagnosis not present

## 2015-06-11 DIAGNOSIS — K219 Gastro-esophageal reflux disease without esophagitis: Secondary | ICD-10-CM | POA: Diagnosis not present

## 2015-06-11 DIAGNOSIS — Z86010 Personal history of colonic polyps: Secondary | ICD-10-CM | POA: Diagnosis not present

## 2015-06-11 MED ORDER — RANITIDINE HCL 150 MG PO TABS: 150.0000 mg | ORAL_TABLET | Freq: Every day | ORAL | Status: DC

## 2015-06-11 MED ORDER — DEXLANSOPRAZOLE 60 MG PO CPDR: 1.0000 | DELAYED_RELEASE_CAPSULE | Freq: Every day | ORAL | Status: DC

## 2015-06-11 MED ORDER — LUBIPROSTONE 24 MCG PO CAPS: 24.0000 ug | ORAL_CAPSULE | Freq: Two times a day (BID) | ORAL | Status: DC

## 2015-06-11 NOTE — Patient Instructions (Signed)
We have sent the following medications to your pharmacy for you to pick up at your convenience: Amitiza 24 mcg twice daily Zantac 1 tablet every night. Dexilant once daily  Please purchase the following medications over the counter and take as directed: Miralax as needed  Please follow up with Dr Hilarie Fredrickson in August 2017.

## 2015-06-11 NOTE — Progress Notes (Signed)
   Subjective:    Patient ID: Jamie Matthews, female    DOB: Jul 05, 1940, 75 y.o.   MRN: [id number]  HPI Jamie Matthews is a  75 year old female with past medical history of IBS with constipation, GERD , diverticulosis with history of diverticulitis is here for follow-up. She was last seen in June 2016. She reports that she is feeling well today. After her last visit we stopped Linzess in favor of Amitiza. She initially started the 24 g dose twice daily and was having diarrhea. We dose reduced but then she was having constipation. She worked her way back to 24 g twice a day and this is now working well. At this dose she has occasional constipation and will use when necessary MiraLAX which also works well. She is currently happy with her bowel symptoms. GERD symptoms are well-controlled with Dexilant 60 mg daily and Zantac at bedtime. She denies nausea, vomiting, dysphagia and odynophagia. No blood in her stool or melena.   Review of Systems  as per history of present illness, otherwise negative  Current Medications, Allergies, Past Medical History, Past Surgical History, Family History and Social History were reviewed in [employer] record.     Objective:   Physical Exam BP 120/60 mmHg  Pulse 68  Ht '5\' 3"'$  (1.6 m)  Wt 169 lb (76.658 kg)  BMI 29.94 kg/m2 Constitutional: Well-developed and well-nourished. No distress. HEENT: Normocephalic and atraumatic. [name] Conjunctivae are normal.  No scleral icterus. Neck: Neck supple. Trachea midline. Cardiovascular: Normal rate, regular rhythm and intact distal pulses.  Pulmonary/chest: Effort normal and breath sounds normal. No wheezing, rales or rhonchi. Abdominal: Soft, nontender, nondistended. Bowel sounds active throughout.  Extremities: no clubbing, cyanosis, or edema Neurological: Alert and oriented to person place and time. Skin: Skin is warm and dry. Psychiatric: Normal mood and affect. Behavior is  normal.     Assessment & Plan:  75 year old female with past medical history of IBS with constipation, GERD , diverticulosis with history of diverticulitis is here for follow-up.  1.  Chronic constipation and IBS --  Now responding well to Amitiza 24 g twice a day. Can continue MiraLAX 17 g daily on an as-needed basis. She is not needing this daily. No alarm symptoms.   2. GERD --  Well-controlled continue Dexilant 60 mg daily and ranitidine daily at bedtime. No current dysphagia   3. Colorectal cancer screening --  Consider repeat colonoscopy September 2017 given history of polyps. We will discuss this in 8 months at her office follow-up

## 2015-06-12 ENCOUNTER — Other Ambulatory Visit: Payer: Self-pay | Admitting: *Deleted

## 2015-06-12 MED ORDER — MIRABEGRON ER 50 MG PO TB24: 50.0000 mg | ORAL_TABLET | Freq: Every day | ORAL | Status: DC

## 2015-06-12 NOTE — Telephone Encounter (Signed)
Ok to refill.  Please schedule lipid panel as soon as she can.

## 2015-06-12 NOTE — Telephone Encounter (Signed)
Pt was in office 01/2015 for CPE but lipid panel not drawn. Pt has only been seen for acute since CPE. Last lipid panel 11/2013

## 2015-06-14 MED ORDER — ATORVASTATIN CALCIUM 20 MG PO TABS: 20.0000 mg | ORAL_TABLET | Freq: Every day | ORAL | Status: DC

## 2015-06-14 NOTE — Telephone Encounter (Signed)
Lm on pts vm requesting a call back to schedule lab appt

## 2015-06-20 ENCOUNTER — Ambulatory Visit
Admission: RE | Admit: 2015-06-20 | Discharge: 2015-06-20 | Disposition: A | Discharge status: Home / Self Care | Payer: Medicare Other | Source: Ambulatory Visit | Attending: Unknown Physician Specialty | Admitting: Unknown Physician Specialty

## 2015-06-20 DIAGNOSIS — M25462 Effusion, left knee: Secondary | ICD-10-CM | POA: Insufficient documentation

## 2015-06-20 DIAGNOSIS — M1712 Unilateral primary osteoarthritis, left knee: Secondary | ICD-10-CM | POA: Diagnosis not present

## 2015-06-20 DIAGNOSIS — M179 Osteoarthritis of knee, unspecified: Secondary | ICD-10-CM | POA: Diagnosis not present

## 2015-06-20 DIAGNOSIS — M7122 Synovial cyst of popliteal space [Baker], left knee: Secondary | ICD-10-CM | POA: Insufficient documentation

## 2015-06-21 DIAGNOSIS — M1712 Unilateral primary osteoarthritis, left knee: Secondary | ICD-10-CM | POA: Diagnosis not present

## 2015-07-06 DIAGNOSIS — H2513 Age-related nuclear cataract, bilateral: Secondary | ICD-10-CM | POA: Diagnosis not present

## 2015-07-09 ENCOUNTER — Encounter: Payer: Self-pay | Admitting: Family Medicine

## 2015-07-09 ENCOUNTER — Ambulatory Visit (INDEPENDENT_AMBULATORY_CARE_PROVIDER_SITE_OTHER): Payer: Medicare Other | Admitting: Family Medicine

## 2015-07-09 VITALS — BP 122/66 | HR 62 | Temp 97.1°F | Wt 160.5 lb

## 2015-07-09 DIAGNOSIS — R1114 Bilious vomiting: Secondary | ICD-10-CM | POA: Diagnosis not present

## 2015-07-09 DIAGNOSIS — R112 Nausea with vomiting, unspecified: Secondary | ICD-10-CM | POA: Insufficient documentation

## 2015-07-09 MED ORDER — ONDANSETRON HCL 4 MG PO TABS: 4.0000 mg | ORAL_TABLET | Freq: Three times a day (TID) | ORAL | Status: DC | PRN

## 2015-07-09 NOTE — Patient Instructions (Signed)

## 2015-07-09 NOTE — Assessment & Plan Note (Signed)
New- probable viral gastroenteritis. Exam reassuring. She is drinking fluids. eRx sent for zofran to use prn nausea. Supportive care as per AVS. Call or return to clinic prn if these symptoms worsen or fail to improve as anticipated. The patient indicates understanding of these issues and agrees with the plan.

## 2015-07-09 NOTE — Progress Notes (Signed)
Subjective:   Patient ID: Jamie Matthews, female    DOB: [birth year], 75 y.o.   MRN: [id number]  Jamie Matthews is a pleasant 75 y.o. year old female who presents to clinic today with Nausea and Emesis  on 07/09/2015  HPI:  2 days of nausea and vomiting.  Has had some chills.  No fever.  No diarrhea.  No abdominal pain- just some soreness.  No blood in emesis or bowels. No sick contacts.  Followed by Dr. Hilarie Fredrickson for GERD and constipation.  Last saw him on 1/16- notes reviewed. No changes made to rxs- advised to continue amitiza 24 ug twice daily for constipation with IBS along with miralax as needed. Felt GERD well controlled with current dose of Dexilant.  Current Outpatient Prescriptions on File Prior to Visit  Medication Sig Dispense Refill  . atorvastatin (LIPITOR) 20 MG tablet Take 1 tablet (20 mg total) by mouth daily. 30 tablet 0  . BIOTIN 5000 PO Take by mouth.    [name] buPROPion (WELLBUTRIN XL) 150 MG 24 hr tablet Take 1 tablet (150 mg total) by mouth daily. 30 tablet 3  . Calcium Carbonate Antacid (ANTACID PO) Take by mouth as needed.    . Ciclopirox 1 % shampoo USE AS DIRECTED 120 mL 0  . dexlansoprazole (DEXILANT) 60 MG capsule Take 1 capsule (60 mg total) by mouth daily. 30 capsule 5  . diphenhydrAMINE (BENADRYL) 25 MG tablet Take 50 mg by mouth daily.     . fluocinonide cream (LIDEX) 0.05 %     . lubiprostone (AMITIZA) 24 MCG capsule Take 1 capsule (24 mcg total) by mouth 2 (two) times daily with a meal. 60 capsule 5  . MAGNESIUM PO Take by mouth.    . memantine (NAMENDA) 10 MG tablet Take 1 tablet (10 mg total) by mouth 2 (two) times daily. 60 tablet 3  . mirabegron ER (MYRBETRIQ) 50 MG TB24 tablet Take 1 tablet (50 mg total) by mouth daily. 30 tablet 3  . Multiple Vitamins-Minerals (CENTRUM PO) Take one by mouth daily    . Propylene Glycol (SYSTANE BALANCE OP) Apply to eye as needed.    . ranitidine (ZANTAC) 150 MG tablet Take 1 tablet (150 mg  total) by mouth at bedtime. 30 tablet 5  . Sodium Chloride-Sodium Bicarb (AYR SALINE NASAL RINSE NA) Place into the nose.    . traZODone (DESYREL) 50 MG tablet Take 0.5-1 tablets (25-50 mg total) by mouth at bedtime as needed for sleep. 30 tablet 3  . triamcinolone (NASACORT) 55 MCG/ACT nasal inhaler Place 2 sprays into the nose daily.    [name] venlafaxine XR (EFFEXOR-XR) 150 MG 24 hr capsule TAKE ONE CAPSULE DAILY 30 capsule 5   No current facility-administered medications on file prior to visit.    Allergies  Allergen Reactions  . Lipitor [Atorvastatin]     Itching   . Sulfa Antibiotics Rash    Past Medical History  Diagnosis Date  . Urinary incontinence   . Depression   . GERD (gastroesophageal reflux disease)   . Diverticulitis   . Hyperlipidemia   . Fibromyalgia   . Pneumonia 11/18/12  . Internal hemorrhoids   . Belching   . Constipation   . Arthritis   . Anxiety   . IBS (irritable bowel syndrome)     Past Surgical History  Procedure Laterality Date  . Cholecystectomy    . Tonsillectomy  1947  . Bladder suspension  2004, 2012    Family History  Problem Relation Age of Onset  . Cancer Father   . Diabetes Father   . Heart disease Father   . Heart disease Mother   . Breast cancer Sister     Social History   Social History  . Marital Status: Married    Spouse Name: N/A  . Number of Children: N/A  . Years of Education: N/A   Occupational History  . Retired    Social History Main Topics  . Smoking status: Never Smoker   . Smokeless tobacco: Never Used  . Alcohol Use: No  . Drug Use: No  . Sexual Activity: Not Currently   Other Topics Concern  . Not on file   Social History Narrative   Recently moved with her husband to [place] from [place].   Husband is a retired Pharmacist, community.   No children.   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   The PMH, PSH, Social History, Family History, Medications, and allergies have been reviewed in [facility], and  have been updated if relevant.  Review of Systems  Constitutional: Negative.   Gastrointestinal: Positive for nausea and vomiting. Negative for diarrhea, constipation, blood in stool, abdominal distention and rectal pain.  Genitourinary: Negative.   Musculoskeletal: Negative.   Skin: Negative.   Neurological: Negative.   All other systems reviewed and are negative.      Objective:    BP 122/66 mmHg  Pulse 62  Temp(Src) 97.1 F (36.2 C) (Oral)  Wt 160 lb 8 oz (72.802 kg)  SpO2 97%  Wt Readings from Last 3 Encounters:  07/09/15 160 lb 8 oz (72.802 kg)  06/11/15 169 lb (76.658 kg)  05/28/15 168 lb (76.204 kg)    Physical Exam  Constitutional: She is oriented to person, place, and time. She appears well-developed and well-nourished. No distress.  HENT:  Head: Normocephalic.  Mouth/Throat: Oropharynx is clear and moist.  Eyes: Conjunctivae are normal.  Cardiovascular: Normal rate.   Pulmonary/Chest: Effort normal.  Abdominal: Soft. Bowel sounds are normal. She exhibits no distension and no mass. There is tenderness. There is no rebound and no guarding.  Musculoskeletal: Normal range of motion.  Neurological: She is alert and oriented to person, place, and time. No cranial nerve deficit.  Skin: Skin is warm and dry. She is not diaphoretic.  Psychiatric: She has a normal mood and affect. Her behavior is normal. Judgment and thought content normal.  Nursing note and vitals reviewed.         Assessment & Plan:   No diagnosis found. No Follow-up on file.

## 2015-07-09 NOTE — Progress Notes (Signed)
Pre visit review using our clinic review tool, if applicable. No additional management support is needed unless otherwise documented below in the visit note. 

## 2015-07-11 ENCOUNTER — Telehealth: Payer: Self-pay | Admitting: Internal Medicine

## 2015-07-11 NOTE — Telephone Encounter (Signed)
Discussed with pt that she has an OV with her PCP on 07/18/15 and that Dr. Hilarie Fredrickson is out of the office until that date. Pt instructed to keep follow-up appt with PCP and if not better at that time to call back and we can then schedule her an appt. Pt verbalized understanding.

## 2015-07-12 ENCOUNTER — Ambulatory Visit: Payer: Self-pay | Admitting: Physician Assistant

## 2015-07-12 ENCOUNTER — Encounter: Payer: Self-pay | Admitting: Emergency Medicine

## 2015-07-12 ENCOUNTER — Emergency Department
Admission: EM | Admit: 2015-07-12 | Discharge: 2015-07-12 | Disposition: A | Discharge status: Home / Self Care | Payer: Medicare Other | Attending: Emergency Medicine | Admitting: Emergency Medicine

## 2015-07-12 ENCOUNTER — Telehealth: Payer: Self-pay | Admitting: Family Medicine

## 2015-07-12 DIAGNOSIS — R1031 Right lower quadrant pain: Secondary | ICD-10-CM | POA: Diagnosis not present

## 2015-07-12 DIAGNOSIS — Z79899 Other long term (current) drug therapy: Secondary | ICD-10-CM | POA: Diagnosis not present

## 2015-07-12 DIAGNOSIS — R112 Nausea with vomiting, unspecified: Secondary | ICD-10-CM | POA: Insufficient documentation

## 2015-07-12 DIAGNOSIS — R109 Unspecified abdominal pain: Secondary | ICD-10-CM

## 2015-07-12 DIAGNOSIS — K5900 Constipation, unspecified: Secondary | ICD-10-CM | POA: Insufficient documentation

## 2015-07-12 DIAGNOSIS — R1011 Right upper quadrant pain: Secondary | ICD-10-CM | POA: Diagnosis not present

## 2015-07-12 LAB — URINALYSIS COMPLETE WITH MICROSCOPIC (ARMC ONLY)
Bilirubin Urine: NEGATIVE
Glucose, UA: NEGATIVE mg/dL
HGB URINE DIPSTICK: NEGATIVE
KETONES UR: NEGATIVE mg/dL
NITRITE: NEGATIVE
PH: 5 (ref 5.0–8.0)
PROTEIN: NEGATIVE mg/dL
SPECIFIC GRAVITY, URINE: 1.02 (ref 1.005–1.030)
Squamous Epithelial / LPF: NONE SEEN

## 2015-07-12 LAB — COMPREHENSIVE METABOLIC PANEL
ALK PHOS: 70 U/L (ref 38–126)
ALT: 20 U/L (ref 14–54)
ANION GAP: 7 (ref 5–15)
AST: 17 U/L (ref 15–41)
Albumin: 4.3 g/dL (ref 3.5–5.0)
BUN: 17 mg/dL (ref 6–20)
CALCIUM: 9.3 mg/dL (ref 8.9–10.3)
CO2: 29 mmol/L (ref 22–32)
Chloride: 102 mmol/L (ref 101–111)
Creatinine, Ser: 0.94 mg/dL (ref 0.44–1.00)
GFR calc non Af Amer: 57 mL/min — ABNORMAL LOW (ref >60)
Glucose, Bld: 118 mg/dL — ABNORMAL HIGH (ref 65–99)
Potassium: 4 mmol/L (ref 3.5–5.1)
SODIUM: 138 mmol/L (ref 135–145)
TOTAL PROTEIN: 7.1 g/dL (ref 6.5–8.1)
Total Bilirubin: 0.9 mg/dL (ref 0.3–1.2)

## 2015-07-12 LAB — LIPASE, BLOOD: Lipase: 20 U/L (ref 11–51)

## 2015-07-12 LAB — CBC
HCT: 39.9 % (ref 35.0–47.0)
HEMOGLOBIN: 13.5 g/dL (ref 12.0–16.0)
MCH: 32.1 pg (ref 26.0–34.0)
MCHC: 33.8 g/dL (ref 32.0–36.0)
MCV: 94.9 fL (ref 80.0–100.0)
Platelets: 238 10*3/uL (ref 150–440)
RBC: 4.2 MIL/uL (ref 3.80–5.20)
RDW: 13.5 % (ref 11.5–14.5)
WBC: 7.5 10*3/uL (ref 3.6–11.0)

## 2015-07-12 MED ORDER — ONDANSETRON 4 MG PO TBDP
4.0000 mg | ORAL_TABLET | Freq: Once | ORAL | Status: AC
  Administered 2015-07-12: 4 mg via ORAL

## 2015-07-12 MED ORDER — ONDANSETRON 4 MG PO TBDP
ORAL_TABLET | ORAL | Status: AC
  Administered 2015-07-12: 4 mg via ORAL
  Filled 2015-07-12: qty 1

## 2015-07-12 MED ORDER — MAGNESIUM CITRATE PO SOLN
0.5000 | Freq: Once | ORAL | Status: AC
  Administered 2015-07-12: 0.5 via ORAL
  Filled 2015-07-12: qty 296

## 2015-07-12 MED ORDER — DOCUSATE SODIUM 100 MG PO CAPS
100.0000 mg | ORAL_CAPSULE | Freq: Once | ORAL | Status: AC
  Administered 2015-07-12: 100 mg via ORAL
  Filled 2015-07-12: qty 1

## 2015-07-12 NOTE — Telephone Encounter (Signed)
Patient Name: Jamie Matthews  DOB: 09/02/1941    Initial Comment Caller states, sick since Sat, saw the Dr on Monday, vomiting, severe abd pains,    Nurse Assessment  Nurse: Raphael Gibney, RN, Vanita Ingles Date/Time (Eastern Time): 07/12/2015 1:10:40 PM  Confirm and document reason for call. If symptomatic, describe symptoms. You must click the next button to save text entered. ---Caller states she was in office on Monday. Doctor said she had a virus. She has been vomiting. She has constipation. No fever. Has severe abd pain. Abd pain started today. Symptoms started on Saturday. Has had severe abd pain about 30 min. she is light headed. She has urinated.  Has the patient traveled out of the country within the last 30 days? ---No  Does the patient have any new or worsening symptoms? ---Yes  Will a triage be completed? ---Yes  Related visit to physician within the last 2 weeks? ---Yes  Does the PT have any chronic conditions? (i.e. diabetes, asthma, etc.) ---No  Is this a behavioral health or substance abuse call? ---No     Guidelines    Guideline Title Affirmed Question Affirmed Notes  Vomiting [1] Drinking very little AND [2] dehydration suspected (e.g., no urine > 12 hours, very dry mouth, very lightheaded)    Final Disposition User   Go to ED Now (or PCP triage) Raphael Gibney, RN, [facility] - ED   Disagree/Comply: Comply

## 2015-07-12 NOTE — ED Notes (Signed)
Pt states she began having constipation on Sat, now nauseated and vomiting. Has had to be hospitalized with an NG tube in the past for the same.

## 2015-07-12 NOTE — Discharge Instructions (Signed)
Abdominal Pain, Adult Many things can cause abdominal pain. Usually, abdominal pain is not caused by a disease and will improve without treatment. It can often be observed and treated at home. Your health care provider will do a physical exam and possibly order blood tests and X-rays to help determine the seriousness of your pain. However, in many cases, more time must pass before a clear cause of the pain can be found. Before that point, your health care provider may not know if you need more testing or further treatment. HOME CARE INSTRUCTIONS Monitor your abdominal pain for any changes. The following actions may help to alleviate any discomfort you are experiencing:  Only take over-the-counter or prescription medicines as directed by your health care provider.  Do not take laxatives unless directed to do so by your health care provider.  Try a clear liquid diet (broth, tea, or water) as directed by your health care provider. Slowly move to a bland diet as tolerated. SEEK MEDICAL CARE IF:  You have unexplained abdominal pain.  You have abdominal pain associated with nausea or diarrhea.  You have pain when you urinate or have a bowel movement.  You experience abdominal pain that wakes you in the night.  You have abdominal pain that is worsened or improved by eating food.  You have abdominal pain that is worsened with eating fatty foods.  You have a fever. SEEK IMMEDIATE MEDICAL CARE IF:  Your pain does not go away within 2 hours.  You keep throwing up (vomiting).  Your pain is felt only in portions of the abdomen, such as the right side or the left lower portion of the abdomen.  You pass bloody or black tarry stools. MAKE SURE YOU:  Understand these instructions.  Will watch your condition.  Will get help right away if you are not doing well or get worse.   This information is not intended to replace advice given to you by your health care provider. Make sure you discuss  any questions you have with your health care provider.   Document Released: 02/19/2005 Document Revised: 01/31/2015 Document Reviewed: 01/19/2013 Elsevier Interactive Patient Education 2016 [employer].  Constipation, Adult Constipation is when a person:  Poops (has a bowel movement) less than 3 times a week.  Has a hard time pooping.  Has poop that is dry, hard, or bigger than normal. HOME CARE   Eat foods with a lot of fiber in them. This includes fruits, vegetables, beans, and whole grains such as brown rice.  Avoid fatty foods and foods with a lot of sugar. This includes french fries, hamburgers, cookies, candy, and soda.  If you are not getting enough fiber from food, take products with added fiber in them (supplements).  Drink enough fluid to keep your pee (urine) clear or pale yellow.  Exercise on a regular basis, or as told by your doctor.  Go to the restroom when you feel like you need to poop. Do not hold it.  Only take medicine as told by your doctor. Do not take medicines that help you poop (laxatives) without talking to your doctor first. GET HELP RIGHT AWAY IF:   You have bright red blood in your poop (stool).  Your constipation lasts more than 4 days or gets worse.  You have belly (abdominal) or butt (rectal) pain.  You have thin poop (as thin as a pencil).  You lose weight, and it cannot be explained. MAKE SURE YOU:   Understand these instructions.  Will watch your condition.  Will get help right away if you are not doing well or get worse.   This information is not intended to replace advice given to you by your health care provider. Make sure you discuss any questions you have with your health care provider.   Document Released: 10/29/2007 Document Revised: 06/02/2014 Document Reviewed: 02/21/2013 Elsevier Interactive Patient Education [employer].

## 2015-07-12 NOTE — ED Provider Notes (Signed)
[facility] Emergency Department Provider Note  ____________________________________________  Time seen: Approximately 4 PM  I have reviewed the triage vital signs and the nursing notes.   HISTORY  Chief Complaint Nausea; Emesis; and Constipation    HPI Jamie Matthews is a 75 y.o. female with a history of depression, fibromyalgia and constipation who is presenting today with constipation, abdominal pain and vomiting. She says that she has not had a large bowel movement since this past Saturday. She has taken multiple laxatives. She has had difficulty eating a normal diet as well but said that she normally eats foot area fruits and vegetables and drinks plenty of water. She says that several years ago she had a similar problem to this and was admitted and needed an NG tube. She denies abdominal distention. Says the pain is cramping and intermittent. It is mostly the right side of her abdomen. Denies any dysuria or frequency of urination.Says that she has not vomited every day. Has been able to tolerate some foods.   Past Medical History  Diagnosis Date  . Urinary incontinence   . Depression   . GERD (gastroesophageal reflux disease)   . Diverticulitis   . Hyperlipidemia   . Fibromyalgia   . Pneumonia 11/18/12  . Internal hemorrhoids   . Belching   . Constipation   . Arthritis   . Anxiety   . IBS (irritable bowel syndrome)     Patient Active Problem List   Diagnosis Date Noted  . Nausea with vomiting 07/09/2015  . Fatigue 02/15/2015  . Abdominal bloating 02/15/2015  . Stress due to illness of family member 10/10/2014  . Ileitis 06/22/2014  . Chronic insomnia 09/20/2013  . Allergic dermatitis 09/20/2013  . Emesis 07/08/2013  . DNR (do not resuscitate) 11/15/2012  . Chronic constipation 04/01/2012  . 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 Urinary incontinence   . Major depressive disorder, recurrent, moderate   . GERD (gastroesophageal reflux  disease)   . Diverticulitis   . Hyperlipidemia     Past Surgical History  Procedure Laterality Date  . Cholecystectomy    . Tonsillectomy  1947  . Bladder suspension  2004, 2012    Current Outpatient Rx  Name  Route  Sig  Dispense  Refill  . atorvastatin (LIPITOR) 20 MG tablet   Oral   Take 1 tablet (20 mg total) by mouth daily.   30 tablet   0     Office visit with labs required for additional ref ...   . BIOTIN 5000 PO   Oral   Take by mouth.         [name] buPROPion (WELLBUTRIN XL) 150 MG 24 hr tablet   Oral   Take 1 tablet (150 mg total) by mouth daily.   30 tablet   3   . Calcium Carbonate Antacid (ANTACID PO)   Oral   Take by mouth as needed.         . Ciclopirox 1 % shampoo      USE AS DIRECTED   120 mL   0   . dexlansoprazole (DEXILANT) 60 MG capsule   Oral   Take 1 capsule (60 mg total) by mouth daily.   30 capsule   5   . diphenhydrAMINE (BENADRYL) 25 MG tablet   Oral   Take 50 mg by mouth daily.          . fluocinonide cream (LIDEX) 0.05 %               .  lubiprostone (AMITIZA) 24 MCG capsule   Oral   Take 1 capsule (24 mcg total) by mouth 2 (two) times daily with a meal.   60 capsule   5   . MAGNESIUM PO   Oral   Take by mouth.         . memantine (NAMENDA) 10 MG tablet   Oral   Take 1 tablet (10 mg total) by mouth 2 (two) times daily.   60 tablet   3   . mirabegron ER (MYRBETRIQ) 50 MG TB24 tablet   Oral   Take 1 tablet (50 mg total) by mouth daily.   30 tablet   3   . Multiple Vitamins-Minerals (CENTRUM PO)      Take one by mouth daily         . ondansetron (ZOFRAN) 4 MG tablet   Oral   Take 1 tablet (4 mg total) by mouth every 8 (eight) hours as needed for nausea or vomiting.   20 tablet   0   . Propylene Glycol (SYSTANE BALANCE OP)   Ophthalmic   Apply to eye as needed.         . ranitidine (ZANTAC) 150 MG tablet   Oral   Take 1 tablet (150 mg total) by mouth at bedtime.   30 tablet   5   .  Sodium Chloride-Sodium Bicarb (AYR SALINE NASAL RINSE NA)   Nasal   Place into the nose.         . traZODone (DESYREL) 50 MG tablet   Oral   Take 0.5-1 tablets (25-50 mg total) by mouth at bedtime as needed for sleep.   30 tablet   3   . triamcinolone (NASACORT) 55 MCG/ACT nasal inhaler   Nasal   Place 2 sprays into the nose daily.         [name] venlafaxine XR (EFFEXOR-XR) 150 MG 24 hr capsule      TAKE ONE CAPSULE DAILY   30 capsule   5     Allergies Lipitor and Sulfa antibiotics  Family History  Problem Relation Age of Onset  . Cancer Father   . Diabetes Father   . Heart disease Father   . Heart disease Mother   . Breast cancer Sister     Social History Social History  Substance Use Topics  . Smoking status: Never Smoker   . Smokeless tobacco: Never Used  . Alcohol Use: No    Review of Systems Constitutional: No fever/chills Eyes: No visual changes. ENT: No sore throat. Cardiovascular: Denies chest pain. Respiratory: Denies shortness of breath. Gastrointestinal:  No diarrhea.   Genitourinary: Negative for dysuria. Musculoskeletal: Negative for back pain. Skin: Negative for rash. Neurological: Negative for headaches, focal weakness or numbness.  10-point ROS otherwise negative.  ____________________________________________   PHYSICAL EXAM:  VITAL SIGNS: ED Triage Vitals  Enc Vitals Group     BP 07/12/15 1435 114/74 mmHg     Pulse Rate 07/12/15 1435 69     Resp 07/12/15 1435 18     Temp 07/12/15 1435 97.7 F (36.5 C)     Temp Source 07/12/15 1435 Oral     SpO2 07/12/15 1435 98 %     Weight 07/12/15 1435 160 lb (72.576 kg)     Height 07/12/15 1435 '5\' 5"'$  (1.651 m)     Head Cir --      Peak Flow --      Pain Score 07/12/15 1435 8     Pain  Loc --      Pain Edu? --      Excl. in [employer]? --     Constitutional: Alert and oriented. Well appearing and in no acute distress. Eyes: Conjunctivae are normal. PERRL. EOMI. Head: Atraumatic. Nose: No  congestion/rhinnorhea. Mouth/Throat: Mucous membranes are moist.  Oropharynx non-erythematous. Neck: No stridor.   Cardiovascular: Normal rate, regular rhythm. Grossly normal heart sounds.  Good peripheral circulation. Respiratory: Normal respiratory effort.  No retractions. Lungs CTAB. Gastrointestinal: Soft mild tenderness to the suprapubic, right lower and right upper quadrants. There is no rebound or guarding. No distention.No CVA tenderness. Rectal exam without fecal impaction. Brown stool on the glove. Musculoskeletal: No lower extremity tenderness nor edema.  No joint effusions. Neurologic:  Normal speech and language. No gross focal neurologic deficits are appreciated. No gait instability. Skin:  Skin is warm, dry and intact. No rash noted. Psychiatric: Mood and affect are normal. Speech and behavior are normal.  ____________________________________________   LABS (all labs ordered are listed, but only abnormal results are displayed)  Labs Reviewed  COMPREHENSIVE METABOLIC PANEL - Abnormal; Notable for the following:    Glucose, Bld 118 (*)    GFR calc non Af Amer 57 (*)    All other components within normal limits  LIPASE, BLOOD  CBC  URINALYSIS COMPLETEWITH MICROSCOPIC ([facility] ONLY)   ____________________________________________  EKG   ____________________________________________  RADIOLOGY   ____________________________________________   PROCEDURES  ____________________________________________   INITIAL IMPRESSION / ASSESSMENT AND PLAN / ED COURSE  Pertinent labs & imaging results that were available during my care of the patient were reviewed by me and considered in my medical decision making (see chart for details).  ----------------------------------------- 4:58 PM on 07/12/2015 -----------------------------------------  Patient had a large bowel movement and is now having relief of her abdominal pain. I reexamined her abdomen and she is soft and  nontender throughout. She is also now without any nausea unable to tolerate fluids. Will be discharged home. Will follow with her primary care doctor. ____________________________________________   FINAL CLINICAL IMPRESSION(S) / ED DIAGNOSES  Constipation. Abdominal pain.    Orbie Pyo, MD 07/12/15 [phone]

## 2015-07-12 NOTE — Telephone Encounter (Signed)
Per chart review tab pt is at [facility] ED now.

## 2015-07-15 LAB — URINE CULTURE

## 2015-07-18 ENCOUNTER — Ambulatory Visit (INDEPENDENT_AMBULATORY_CARE_PROVIDER_SITE_OTHER): Payer: Medicare Other | Admitting: Family Medicine

## 2015-07-18 ENCOUNTER — Encounter: Payer: Self-pay | Admitting: Family Medicine

## 2015-07-18 VITALS — BP 132/70 | HR 68 | Temp 97.8°F | Ht 63.0 in | Wt 165.0 lb

## 2015-07-18 DIAGNOSIS — E785 Hyperlipidemia, unspecified: Secondary | ICD-10-CM | POA: Diagnosis not present

## 2015-07-18 DIAGNOSIS — Z0000 Encounter for general adult medical examination without abnormal findings: Secondary | ICD-10-CM | POA: Diagnosis not present

## 2015-07-18 DIAGNOSIS — K5900 Constipation, unspecified: Secondary | ICD-10-CM

## 2015-07-18 DIAGNOSIS — Z66 Do not resuscitate: Secondary | ICD-10-CM | POA: Diagnosis not present

## 2015-07-18 DIAGNOSIS — Z01419 Encounter for gynecological examination (general) (routine) without abnormal findings: Secondary | ICD-10-CM | POA: Insufficient documentation

## 2015-07-18 DIAGNOSIS — F331 Major depressive disorder, recurrent, moderate: Secondary | ICD-10-CM | POA: Diagnosis not present

## 2015-07-18 DIAGNOSIS — K5909 Other constipation: Secondary | ICD-10-CM

## 2015-07-18 DIAGNOSIS — Z23 Encounter for immunization: Secondary | ICD-10-CM | POA: Diagnosis not present

## 2015-07-18 LAB — CBC WITH DIFFERENTIAL/PLATELET
BASOS PCT: 0.5 % (ref 0.0–3.0)
Basophils Absolute: 0 10*3/uL (ref 0.0–0.1)
EOS PCT: 4.5 % (ref 0.0–5.0)
Eosinophils Absolute: 0.3 10*3/uL (ref 0.0–0.7)
HCT: 36.2 % (ref 36.0–46.0)
Hemoglobin: 12.1 g/dL (ref 12.0–15.0)
LYMPHS ABS: 1.2 10*3/uL (ref 0.7–4.0)
Lymphocytes Relative: 16.3 % (ref 12.0–46.0)
MCHC: 33.6 g/dL (ref 30.0–36.0)
MCV: 95.8 fl (ref 78.0–100.0)
MONOS PCT: 11 % (ref 3.0–12.0)
Monocytes Absolute: 0.8 10*3/uL (ref 0.1–1.0)
NEUTROS PCT: 67.7 % (ref 43.0–77.0)
Neutro Abs: 5 10*3/uL (ref 1.4–7.7)
Platelets: 263 10*3/uL (ref 150.0–400.0)
RBC: 3.77 Mil/uL — AB (ref 3.87–5.11)
RDW: 13.6 % (ref 11.5–15.5)
WBC: 7.4 10*3/uL (ref 4.0–10.5)

## 2015-07-18 LAB — COMPREHENSIVE METABOLIC PANEL
ALBUMIN: 4 g/dL (ref 3.5–5.2)
ALK PHOS: 69 U/L (ref 39–117)
ALT: 17 U/L (ref 0–35)
AST: 18 U/L (ref 0–37)
BUN: 21 mg/dL (ref 6–23)
CO2: 31 mEq/L (ref 19–32)
Calcium: 8.9 mg/dL (ref 8.4–10.5)
Chloride: 102 mEq/L (ref 96–112)
Creatinine, Ser: 0.88 mg/dL (ref 0.40–1.20)
GFR: 66.25 mL/min (ref >60.00)
Glucose, Bld: 137 mg/dL — ABNORMAL HIGH (ref 70–99)
POTASSIUM: 4 meq/L (ref 3.5–5.1)
Sodium: 138 mEq/L (ref 135–145)
TOTAL PROTEIN: 6.5 g/dL (ref 6.0–8.3)
Total Bilirubin: 0.4 mg/dL (ref 0.2–1.2)

## 2015-07-18 LAB — LIPID PANEL
Cholesterol: 167 mg/dL (ref 0–200)
HDL: 44.8 mg/dL (ref >39.00)
LDL Cholesterol: 107 mg/dL — ABNORMAL HIGH (ref 0–99)
NONHDL: 121.94
Total CHOL/HDL Ratio: 4
Triglycerides: 74 mg/dL (ref 0.0–149.0)
VLDL: 14.8 mg/dL (ref 0.0–40.0)

## 2015-07-18 NOTE — Addendum Note (Signed)
Addended by: Lucille Passy on: 07/18/2015 09:41 AM   Modules accepted: Miquel Dunn

## 2015-07-18 NOTE — Addendum Note (Signed)
Addended by: Modena Nunnery on: 07/18/2015 10:07 AM   Modules accepted: Orders

## 2015-07-18 NOTE — Assessment & Plan Note (Signed)
Due for labs today. Continue current dose of lipitor.

## 2015-07-18 NOTE — Patient Instructions (Signed)
Great to see you. We will call you with your results from today and you can view them online.

## 2015-07-18 NOTE — Assessment & Plan Note (Signed)
Continue current rxs.

## 2015-07-18 NOTE — Progress Notes (Addendum)
Subjective:    Patient ID: Jamie Matthews, female    DOB: Aug 10, 1940, 75 y.o.   MRN: [id number]  HPI Very pleasant 75 yo female here for medicare annual wellness visit and follow up of chronic medical conditions.  I have personally reviewed the Medicare Annual Wellness questionnaire and have noted 1. The patient's medical and social history 2. Their use of alcohol, tobacco or illicit drugs 3. Their current medications and supplements 4. The patient's functional ability including ADL's, fall risks, home safety risks and hearing or visual             impairment. 5. Diet and physical activities 6. Evidence for depression or mood disorders  End of life wishes discussed and updated in Social History.  The roster of all physicians providing medical care to patient - is listed in the CareTeams section of the chart.  Influenza vaccine 02/15/15 prevnar 13 12/13/13 Mammogram 03/26/15 DEXA 09/21/14 Colonoscopy 02/10/11 Just saw dermatologist for skin survey- Kowalski  Depression- chronic issue and currently symptoms are well controlled with her current medications.  Her husband is having a lot of medical problems which does understandably weigh heavily on [name]. Trazodone is helping with insomnia.  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  Per pt, could not tolerate other medications.    HLD- taking lipitor 20 mg daily.  Lab Results  Component Value Date   CHOL 200 12/13/2013   HDL 44.00 12/13/2013   LDLCALC 132* 12/13/2013   LDLDIRECT 175.9 02/05/2012   TRIG 122.0 12/13/2013   CHOLHDL 5 12/13/2013   Followed by Dr. Hilarie Fredrickson for GERD and constipation. Last saw him on 1/16- notes reviewed. No changes made to rxs- advised to continue amitiza 24 ug twice daily for constipation with IBS along with miralax as needed. Felt GERD well controlled with current dose of Dexilant.  Was seen in ED on 07/12/15- note reviewed, for abdominal pain.  Lipase negative. Symptoms improved with large  BM.  Felt the pain was due to constipation. Abdominal pain resolved, still working with constipation.   Patient Active Problem List   Diagnosis Date Noted  . Medicare annual wellness visit, subsequent 07/18/2015  . Stress due to illness of family member 10/10/2014  . Ileitis 06/22/2014  . Chronic insomnia 09/20/2013  . Allergic dermatitis 09/20/2013  . DNR (do not resuscitate) 11/15/2012  . Chronic constipation 04/01/2012  . 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 Urinary incontinence   . Major depressive disorder, recurrent, moderate   . GERD (gastroesophageal reflux disease)   . Diverticulitis   . Hyperlipidemia    Past Medical History  Diagnosis Date  . Urinary incontinence   . Depression   . GERD (gastroesophageal reflux disease)   . Diverticulitis   . Hyperlipidemia   . Fibromyalgia   . Pneumonia 11/18/12  . Internal hemorrhoids   . Belching   . Constipation   . Arthritis   . Anxiety   . IBS (irritable bowel syndrome)    Past Surgical History  Procedure Laterality Date  . Cholecystectomy    . Tonsillectomy  1947  . Bladder suspension  2004, 2012   Social History  Substance Use Topics  . Smoking status: Never Smoker   . Smokeless tobacco: Never Used  . Alcohol Use: No   Family History  Problem Relation Age of Onset  . Cancer Father   . Diabetes Father   . Heart disease Father   . Heart disease Mother   . Breast cancer Sister    Allergies  Allergen  Reactions  . Lipitor [Atorvastatin]     Itching   . Sulfa Antibiotics Rash   Current Outpatient Prescriptions on File Prior to Visit  Medication Sig Dispense Refill  . atorvastatin (LIPITOR) 20 MG tablet Take 1 tablet (20 mg total) by mouth daily. 30 tablet 0  . BIOTIN 5000 PO Take by mouth.    [name] buPROPion (WELLBUTRIN XL) 150 MG 24 hr tablet Take 1 tablet (150 mg total) by mouth daily. 30 tablet 3  . Calcium Carbonate Antacid (ANTACID PO) Take by mouth as needed.    . Ciclopirox 1 % shampoo USE AS DIRECTED 120 mL 0   . dexlansoprazole (DEXILANT) 60 MG capsule Take 1 capsule (60 mg total) by mouth daily. 30 capsule 5  . diphenhydrAMINE (BENADRYL) 25 MG tablet Take 50 mg by mouth daily.     . fluocinonide cream (LIDEX) 0.05 %     . lubiprostone (AMITIZA) 24 MCG capsule Take 1 capsule (24 mcg total) by mouth 2 (two) times daily with a meal. 60 capsule 5  . MAGNESIUM PO Take by mouth.    . memantine (NAMENDA) 10 MG tablet Take 1 tablet (10 mg total) by mouth 2 (two) times daily. 60 tablet 3  . mirabegron ER (MYRBETRIQ) 50 MG TB24 tablet Take 1 tablet (50 mg total) by mouth daily. 30 tablet 3  . Multiple Vitamins-Minerals (CENTRUM PO) Take one by mouth daily    . ondansetron (ZOFRAN) 4 MG tablet Take 1 tablet (4 mg total) by mouth every 8 (eight) hours as needed for nausea or vomiting. 20 tablet 0  . Propylene Glycol (SYSTANE BALANCE OP) Apply to eye as needed.    . ranitidine (ZANTAC) 150 MG tablet Take 1 tablet (150 mg total) by mouth at bedtime. 30 tablet 5  . Sodium Chloride-Sodium Bicarb (AYR SALINE NASAL RINSE NA) Place into the nose.    . traZODone (DESYREL) 50 MG tablet Take 0.5-1 tablets (25-50 mg total) by mouth at bedtime as needed for sleep. 30 tablet 3  . triamcinolone (NASACORT) 55 MCG/ACT nasal inhaler Place 2 sprays into the nose daily.    [name] venlafaxine XR (EFFEXOR-XR) 150 MG 24 hr capsule TAKE ONE CAPSULE DAILY 30 capsule 5   No current facility-administered medications on file prior to visit.   The PMH, PSH, Social History, Family History, Medications, and allergies have been reviewed in [facility], and have been updated if relevant.   Review of Systems  Constitutional: Negative.   HENT: Negative.   Eyes: Negative.   Respiratory: Negative.   Cardiovascular: Negative.   Gastrointestinal: Positive for constipation. Negative for abdominal pain.  Endocrine: Negative.   Genitourinary: Negative.   Musculoskeletal: Negative.   Skin: Negative.   Allergic/Immunologic: Negative.   Neurological:  Negative.   Hematological: Negative.   Psychiatric/Behavioral: Negative.   All other systems reviewed and are negative.       Objective:   Physical Exam BP 132/70 mmHg  Pulse 68  Temp(Src) 97.8 F (36.6 C) (Oral)  Ht '5\' 3"'$  (1.6 m)  Wt 165 lb (74.844 kg)  BMI 29.24 kg/m2  SpO2 95%  General:  Well-developed,well-nourished,in no acute distress; alert,appropriate and cooperative throughout examination Head:  normocephalic and atraumatic.   Eyes:  vision grossly intact, pupils equal, pupils round, and pupils reactive to light.   Ears:  R ear normal and L ear normal.   Nose:  no external deformity.   Lungs:  Normal respiratory effort, chest expands symmetrically. Lungs are clear to auscultation, no crackles  or wheezes. Heart:  Normal rate and regular rhythm. S1 and S2 normal without gallop, murmur, click, rub or other extra sounds. Abdomen:  Bowel sounds positive,abdomen soft and non-tender without masses, organomegaly or hernias noted. Msk:  No deformity or scoliosis noted of thoracic or lumbar spine.   Extremities:  No clubbing, cyanosis, edema, or deformity noted with normal full range of motion of all joints.   Neurologic:  alert & oriented X3 and gait normal.   Skin:  Intact without suspicious lesions or rashes Psych:  Cognition and judgment appear intact. Alert and cooperative with normal attention span and concentration. No apparent delusions, illusions, hallucinations        Assessment & Plan:

## 2015-07-18 NOTE — Assessment & Plan Note (Addendum)
The patients weight, height, BMI and visual acuity have been recorded in the chart.  Cognitive function assessed.   I have made referrals, counseling and provided education to the patient based review of the above and I have provided the pt with a written personalized care plan for preventive services.  Pneumovax today.  Labs today.  Orders Placed This Encounter  Procedures  . CBC with Differential/Platelet  . Comprehensive metabolic panel  . Lipid panel  . TSH

## 2015-07-18 NOTE — Progress Notes (Signed)
Pre visit review using our clinic review tool, if applicable. No additional management support is needed unless otherwise documented below in the visit note. 

## 2015-07-18 NOTE — Assessment & Plan Note (Signed)
Persistent issue.  Advised to continue current bowel regimen. Followed by GI.

## 2015-07-19 ENCOUNTER — Encounter: Payer: Self-pay | Admitting: *Deleted

## 2015-07-19 LAB — TSH: TSH: 3.65 u[IU]/mL (ref 0.35–4.50)

## 2015-07-31 ENCOUNTER — Other Ambulatory Visit: Payer: Self-pay | Admitting: Family Medicine

## 2015-08-13 ENCOUNTER — Other Ambulatory Visit: Payer: Self-pay | Admitting: Family Medicine

## 2015-08-14 ENCOUNTER — Telehealth: Payer: Self-pay

## 2015-08-14 DIAGNOSIS — R921 Mammographic calcification found on diagnostic imaging of breast: Secondary | ICD-10-CM

## 2015-08-14 NOTE — Telephone Encounter (Signed)
Order entered

## 2015-08-14 NOTE — Telephone Encounter (Signed)
Pt left v/m requesting order for diagnostic mammogram; pt said she gets mammogram q 6 months and last done in 02/2015.Please advise. Pt last annual exam 07/18/15.

## 2015-08-15 NOTE — Telephone Encounter (Signed)
Spoke to [facility] they need a uni right ultra sound to go with mammogram order

## 2015-08-15 NOTE — Telephone Encounter (Signed)
Order entered

## 2015-08-20 DIAGNOSIS — H2513 Age-related nuclear cataract, bilateral: Secondary | ICD-10-CM | POA: Diagnosis not present

## 2015-08-21 ENCOUNTER — Encounter: Payer: Self-pay | Admitting: *Deleted

## 2015-08-26 NOTE — H&P (Signed)
See scanned note.

## 2015-08-27 ENCOUNTER — Ambulatory Visit: Payer: Medicare Other | Admitting: Anesthesiology

## 2015-08-27 ENCOUNTER — Encounter: Payer: Self-pay | Admitting: *Deleted

## 2015-08-27 ENCOUNTER — Encounter
Admission: RE | Disposition: A | Discharge status: Home / Self Care | Payer: Self-pay | Source: Ambulatory Visit | Attending: Ophthalmology

## 2015-08-27 ENCOUNTER — Ambulatory Visit
Admission: RE | Admit: 2015-08-27 | Discharge: 2015-08-27 | Disposition: A | Discharge status: Home / Self Care | Payer: Medicare Other | Source: Ambulatory Visit | Attending: Ophthalmology | Admitting: Ophthalmology

## 2015-08-27 DIAGNOSIS — E7800 Pure hypercholesterolemia, unspecified: Secondary | ICD-10-CM | POA: Insufficient documentation

## 2015-08-27 DIAGNOSIS — K219 Gastro-esophageal reflux disease without esophagitis: Secondary | ICD-10-CM | POA: Insufficient documentation

## 2015-08-27 DIAGNOSIS — F418 Other specified anxiety disorders: Secondary | ICD-10-CM | POA: Diagnosis not present

## 2015-08-27 DIAGNOSIS — F329 Major depressive disorder, single episode, unspecified: Secondary | ICD-10-CM | POA: Diagnosis not present

## 2015-08-27 DIAGNOSIS — H2511 Age-related nuclear cataract, right eye: Secondary | ICD-10-CM | POA: Insufficient documentation

## 2015-08-27 DIAGNOSIS — M170 Bilateral primary osteoarthritis of knee: Secondary | ICD-10-CM | POA: Diagnosis not present

## 2015-08-27 DIAGNOSIS — N3281 Overactive bladder: Secondary | ICD-10-CM | POA: Diagnosis not present

## 2015-08-27 DIAGNOSIS — M797 Fibromyalgia: Secondary | ICD-10-CM | POA: Insufficient documentation

## 2015-08-27 DIAGNOSIS — E785 Hyperlipidemia, unspecified: Secondary | ICD-10-CM | POA: Diagnosis not present

## 2015-08-27 DIAGNOSIS — H25041 Posterior subcapsular polar age-related cataract, right eye: Secondary | ICD-10-CM | POA: Diagnosis not present

## 2015-08-27 DIAGNOSIS — H2513 Age-related nuclear cataract, bilateral: Secondary | ICD-10-CM | POA: Diagnosis not present

## 2015-08-27 DIAGNOSIS — R413 Other amnesia: Secondary | ICD-10-CM | POA: Insufficient documentation

## 2015-08-27 DIAGNOSIS — Z9049 Acquired absence of other specified parts of digestive tract: Secondary | ICD-10-CM | POA: Diagnosis not present

## 2015-08-27 DIAGNOSIS — H268 Other specified cataract: Secondary | ICD-10-CM | POA: Diagnosis present

## 2015-08-27 HISTORY — PX: CATARACT EXTRACTION W/PHACO: SHX586

## 2015-08-27 HISTORY — DX: Other amnesia: R41.3

## 2015-08-27 HISTORY — DX: Functional intestinal disorder, unspecified: K59.9

## 2015-08-27 SURGERY — PHACOEMULSIFICATION, CATARACT, WITH IOL INSERTION
Anesthesia: Monitor Anesthesia Care | Laterality: Right

## 2015-08-27 MED ORDER — POVIDONE-IODINE 5 % OP SOLN
OPHTHALMIC | Status: AC
  Filled 2015-08-27: qty 30

## 2015-08-27 MED ORDER — LIDOCAINE HCL (PF) 4 % IJ SOLN
INTRAMUSCULAR | Status: DC | PRN
  Administered 2015-08-27: 5 mL via OPHTHALMIC

## 2015-08-27 MED ORDER — MIDAZOLAM HCL 5 MG/5ML IJ SOLN
INTRAMUSCULAR | Status: DC | PRN
  Administered 2015-08-27: 1 mg via INTRAVENOUS

## 2015-08-27 MED ORDER — BSS IO SOLN
INTRAOCULAR | Status: DC | PRN
  Administered 2015-08-27: .5 mL via OPHTHALMIC

## 2015-08-27 MED ORDER — SODIUM CHLORIDE 0.9 % IV SOLN
INTRAVENOUS | Status: DC
  Administered 2015-08-27: 11:00:00 via INTRAVENOUS

## 2015-08-27 MED ORDER — MOXIFLOXACIN HCL 0.5 % OP SOLN
1.0000 [drp] | OPHTHALMIC | Status: AC | PRN
  Administered 2015-08-27 (×3): 1 [drp] via OPHTHALMIC

## 2015-08-27 MED ORDER — MOXIFLOXACIN HCL 0.5 % OP SOLN
OPHTHALMIC | Status: DC | PRN
  Administered 2015-08-27: 2 [drp] via OPHTHALMIC

## 2015-08-27 MED ORDER — CEFUROXIME OPHTHALMIC INJECTION 1 MG/0.1 ML
INJECTION | OPHTHALMIC | Status: DC | PRN
Start: 2015-08-27 — End: 2015-08-27
  Administered 2015-08-27: .1 mL via INTRACAMERAL

## 2015-08-27 MED ORDER — MOXIFLOXACIN HCL 0.5 % OP SOLN
OPHTHALMIC | Status: AC
  Administered 2015-08-27: 1 [drp] via OPHTHALMIC
  Filled 2015-08-27: qty 3

## 2015-08-27 MED ORDER — CARBACHOL 0.01 % IO SOLN
INTRAOCULAR | Status: DC | PRN
  Administered 2015-08-27: .5 mL via INTRAOCULAR

## 2015-08-27 MED ORDER — TETRACAINE HCL 0.5 % OP SOLN
OPHTHALMIC | Status: AC
Start: 2015-08-27 — End: 2015-08-27
  Filled 2015-08-27: qty 2

## 2015-08-27 MED ORDER — TETRACAINE 0.5 % OP SOLN OPTIME - NO CHARGE
OPHTHALMIC | Status: DC | PRN
  Administered 2015-08-27: 2 [drp] via OPHTHALMIC

## 2015-08-27 MED ORDER — CEFUROXIME OPHTHALMIC INJECTION 1 MG/0.1 ML
INJECTION | OPHTHALMIC | Status: AC
  Filled 2015-08-27: qty 0.1

## 2015-08-27 MED ORDER — PHENYLEPHRINE HCL 10 % OP SOLN
1.0000 [drp] | OPHTHALMIC | Status: AC | PRN
  Administered 2015-08-27 (×4): 1 [drp] via OPHTHALMIC

## 2015-08-27 MED ORDER — CYCLOPENTOLATE HCL 2 % OP SOLN
1.0000 [drp] | OPHTHALMIC | Status: DC | PRN
  Administered 2015-08-27 (×3): 1 [drp] via OPHTHALMIC

## 2015-08-27 MED ORDER — BUPIVACAINE HCL (PF) 0.75 % IJ SOLN
INTRAMUSCULAR | Status: AC
  Filled 2015-08-27: qty 10

## 2015-08-27 MED ORDER — ALFENTANIL 500 MCG/ML IJ INJ
INJECTION | INTRAMUSCULAR | Status: DC | PRN
  Administered 2015-08-27: 500 ug via INTRAVENOUS

## 2015-08-27 MED ORDER — LIDOCAINE HCL (PF) 4 % IJ SOLN
INTRAMUSCULAR | Status: AC
  Filled 2015-08-27: qty 5

## 2015-08-27 MED ORDER — HYALURONIDASE HUMAN 150 UNIT/ML IJ SOLN
INTRAMUSCULAR | Status: AC
  Filled 2015-08-27: qty 1

## 2015-08-27 MED ORDER — CYCLOPENTOLATE HCL 2 % OP SOLN
OPHTHALMIC | Status: AC
  Administered 2015-08-27: 1 [drp] via OPHTHALMIC
  Filled 2015-08-27: qty 2

## 2015-08-27 MED ORDER — POVIDONE-IODINE 5 % OP SOLN
OPHTHALMIC | Status: DC | PRN
  Administered 2015-08-27: 1 via OPHTHALMIC

## 2015-08-27 MED ORDER — NA CHONDROIT SULF-NA HYALURON 40-17 MG/ML IO SOLN
INTRAOCULAR | Status: AC
  Filled 2015-08-27: qty 1

## 2015-08-27 MED ORDER — BSS IO SOLN
INTRAOCULAR | Status: DC | PRN
  Administered 2015-08-27: 250 mL via OPHTHALMIC

## 2015-08-27 MED ORDER — EPINEPHRINE HCL 1 MG/ML IJ SOLN
INTRAMUSCULAR | Status: AC
  Filled 2015-08-27: qty 2

## 2015-08-27 MED ORDER — PHENYLEPHRINE HCL 10 % OP SOLN
OPHTHALMIC | Status: AC
  Administered 2015-08-27: 1 [drp] via OPHTHALMIC
  Filled 2015-08-27: qty 5

## 2015-08-27 SURGICAL SUPPLY — 30 items
CANNULA ANT/CHMB 27GA (MISCELLANEOUS) ×3 IMPLANT
CORD BIP STRL DISP 12FT (MISCELLANEOUS) ×3 IMPLANT
CUP MEDICINE 2OZ PLAST GRAD ST (MISCELLANEOUS) ×3 IMPLANT
DRAPE XRAY CASSETTE 23X24 (DRAPES) ×3 IMPLANT
ERASER HMR WETFIELD 18G (MISCELLANEOUS) ×3 IMPLANT
GLOVE BIO SURGEON STRL SZ8 (GLOVE) ×3 IMPLANT
GLOVE SURG LX 6.5 MICRO (GLOVE) ×2
GLOVE SURG LX 8.0 MICRO (GLOVE) ×2
GLOVE SURG LX STRL 6.5 MICRO (GLOVE) ×1 IMPLANT
GLOVE SURG LX STRL 8.0 MICRO (GLOVE) ×1 IMPLANT
GOWN STRL REUS W/ TWL LRG LVL3 (GOWN DISPOSABLE) ×1 IMPLANT
GOWN STRL REUS W/ TWL XL LVL3 (GOWN DISPOSABLE) ×1 IMPLANT
GOWN STRL REUS W/TWL LRG LVL3 (GOWN DISPOSABLE) ×2
GOWN STRL REUS W/TWL XL LVL3 (GOWN DISPOSABLE) ×2
LENS IOL ACRSF IQ ULTRA 16.5 (Intraocular Lens) ×1 IMPLANT
LENS IOL ACRYSOF IQ 16.5 (Intraocular Lens) ×3 IMPLANT
PACK CATARACT (MISCELLANEOUS) ×3 IMPLANT
PACK CATARACT DINGLEDEIN LX (MISCELLANEOUS) ×3 IMPLANT
PACK EYE AFTER SURG (MISCELLANEOUS) ×3 IMPLANT
SHLD EYE VISITEC  UNIV (MISCELLANEOUS) ×3 IMPLANT
SOL BSS BAG (MISCELLANEOUS) ×3
SOL PREP PVP 2OZ (MISCELLANEOUS)
SOLUTION BSS BAG (MISCELLANEOUS) ×1 IMPLANT
SOLUTION PREP PVP 2OZ (MISCELLANEOUS) IMPLANT
SUT SILK 5-0 (SUTURE) ×3 IMPLANT
SYR 3ML LL SCALE MARK (SYRINGE) ×3 IMPLANT
SYR 5ML LL (SYRINGE) ×3 IMPLANT
SYR TB 1ML 27GX1/2 LL (SYRINGE) ×3 IMPLANT
WATER STERILE IRR 1000ML POUR (IV SOLUTION) ×3 IMPLANT
WIPE NON LINTING 3.25X3.25 (MISCELLANEOUS) ×3 IMPLANT

## 2015-08-27 NOTE — Interval H&P Note (Signed)
History and Physical Interval Note:  08/27/2015 11:09 AM  Jamie Matthews  has presented today for surgery, with the diagnosis of CATARACT  The various methods of treatment have been discussed with the patient and family. After consideration of risks, benefits and other options for treatment, the patient has consented to  Procedure(s): CATARACT EXTRACTION PHACO AND INTRAOCULAR LENS PLACEMENT ([facility]) (Right) as a surgical intervention .  The patient's history has been reviewed, patient examined, no change in status, stable for surgery.  I have reviewed the patient's chart and labs.  Questions were answered to the patient's satisfaction.     [name]

## 2015-08-27 NOTE — Discharge Instructions (Addendum)
See handout. General Anesthesia, Adult, Care After Refer to this sheet in the next few weeks. These instructions provide you with information on caring for yourself after your procedure. Your health care provider may also give you more specific instructions. Your treatment has been planned according to current medical practices, but problems sometimes occur. Call your health care provider if you have any problems or questions after your procedure. WHAT TO EXPECT AFTER THE PROCEDURE After the procedure, it is typical to experience:  Sleepiness.  Nausea and vomiting. HOME CARE INSTRUCTIONS  For the first 24 hours after general anesthesia:  Have a responsible person with you.  Do not drive a car. If you are alone, do not take public transportation.  Do not drink alcohol.  Do not take medicine that has not been prescribed by your health care provider.  Do not sign important papers or make important decisions.  You may resume a normal diet and activities as directed by your health care provider.  Change bandages (dressings) as directed.  If you have questions or problems that seem related to general anesthesia, call the hospital and ask for the anesthetist or anesthesiologist on call. SEEK MEDICAL CARE IF:  You have nausea and vomiting that continue the day after anesthesia.  You develop a rash. SEEK IMMEDIATE MEDICAL CARE IF:   You have difficulty breathing.  You have chest pain.  You have any allergic problems.   This information is not intended to replace advice given to you by your health care provider. Make sure you discuss any questions you have with your health care provider.   Document Released: 08/18/2000 Document Revised: 06/02/2014 Document Reviewed: 09/10/2011 Elsevier Interactive Patient Education [employer].

## 2015-08-27 NOTE — Op Note (Signed)
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 Item Serial No. Manufacturer Lot No. LRB No. Used  LENS IOL ACRYSOF IQ 16.5 - [id number] Intraocular Lens LENS IOL ACRYSOF IQ 16.5 [id number] ALCON [id number] Right 1   Anesthesia: 2% Lidocaine and 4% Marcaine in a 50/50 mixture with 10 unites/ml of Hylenex given as a peribulbar Anesthesiologist: Anesthesiologist: Gunnar Bulla, MD CRNA: Silvana Newness, CRNA Complications: none Estimated Blood Loss: less than 1 ml  Description of procedure:  The patient was given anesthesia and sedation via intravenous access. The patient was then prepped and draped in the usual fashion. A 25-gauge needle was bent for initiating the capsulorhexis. A 5-0 silk suture was placed through the conjunctiva superior and inferiorly to serve as bridle sutures. Hemostasis was obtained at the superior limbus using an eraser cautery. A partial thickness groove was made at the anterior surgical limbus with a 64 Beaver blade and this was dissected anteriorly with an Avaya. The anterior chamber was entered at 10 o'clock with a 1.0 mm paracentesis knife and through the lamellar dissection with a 2.6 mm Alcon keratome. Epi-Shugarcaine 0.5 CC [9 cc BSS Plus (Alcon), 3 cc 4% preservative-free lidocaine (Hospira) and 4 cc 1:1000 preservative-free, bisulfite-free epinephrine] was injected into the anterior chamber via the paracentesis tract. Epi-Shugarcaine 0.5 CC [9 cc BSS Plus (Alcon), 3 cc 4% preservative-free lidocaine (Hospira) and 4 cc 1:1000 preservative-free, bisulfite-free epinephrine] was injected into the anterior chamber via the paracentesis tract. DiscoVisc  was injected to replace the aqueous and a continuous tear curvilinear capsulorhexis was performed using a bent 25-gauge needle.  Balance salt on a syringe was used to perform hydro-dissection and phacoemulsification was carried out using a divide and conquer technique. Procedure(s) with comments: CATARACT EXTRACTION PHACO AND INTRAOCULAR LENS PLACEMENT (I[facility]) (Right) - [place]   1:00.2 AP%  22.5 CDE  23.67 fluid casette lot #[id number]  exp05/31/2018. Irrigation/aspiration was used to remove the residual cortex and the capsular bag was inflated with DiscoVisc. The intraocular lens was inserted into the capsular bag using a pre-loaded UltraSert Delivery System. Irrigation/aspiration was used to remove the residual DiscoVisc. The wound was inflated with balanced salt and checked for leaks. None were found. Miostat was injected via the paracentesis track and 0.1 ml of cefuroxime containing 1 mg of drug  was injected via the paracentesis track. The wound was checked for leaks again and none were found.   The bridal sutures were removed and two drops of Vigamox were placed on the eye. An eye shield was placed to protect the eye and the patient was discharged to the recovery area in good condition.   Kyri Shader MD

## 2015-08-27 NOTE — Transfer of Care (Signed)
Immediate Anesthesia Transfer of Care Note  Patient: Jamie Matthews  Procedure(s) Performed: Procedure(s) with comments: CATARACT EXTRACTION PHACO AND INTRAOCULAR LENS PLACEMENT ([facility]) (Right) - [place]   1:00.2 AP%  22.5 CDE  23.67 fluid casette lot #[id number]  exp05/31/2018  Patient Location: PACU  Anesthesia Type:MAC  Level of Consciousness: awake, alert , oriented and patient cooperative  Airway & Oxygen Therapy: Patient Spontanous Breathing  Post-op Assessment: Report given to RN, Post -op Vital signs reviewed and stable and Patient moving all extremities X 4  Post vital signs: Reviewed and stable  Last Vitals:  Filed Vitals:   08/27/15 0946  BP: 127/56  Pulse: 64  Temp: 36.4 C  Resp: 16    Complications: No apparent anesthesia complications

## 2015-08-27 NOTE — Anesthesia Postprocedure Evaluation (Signed)
Anesthesia Post Note  Patient: Jamie Matthews  Procedure(s) Performed: Procedure(s) (LRB): CATARACT EXTRACTION PHACO AND INTRAOCULAR LENS PLACEMENT ([facility]) (Right)  Patient location during evaluation: PACU Anesthesia Type: MAC Level of consciousness: awake and alert and oriented Pain management: satisfactory to patient Vital Signs Assessment: post-procedure vital signs reviewed and stable Respiratory status: respiratory function stable and spontaneous breathing Cardiovascular status: stable Anesthetic complications: no    Last Vitals:  Filed Vitals:   08/27/15 0946  BP: 127/56  Pulse: 64  Temp: 36.4 C  Resp: 16    Last Pain:  Filed Vitals:   08/27/15 0947  PainSc: 0-No pain                 Silvana Newness A

## 2015-08-27 NOTE — Anesthesia Preprocedure Evaluation (Signed)
Anesthesia Evaluation  Patient identified by MRN, date of birth, ID band Patient awake    Reviewed: Allergy & Precautions, NPO status , Patient's Chart, lab work & pertinent test results, reviewed documented beta blocker date and time   Airway Mallampati: II  TM Distance: >3 FB     Dental  (+) Chipped   Pulmonary pneumonia, resolved,           Cardiovascular      Neuro/Psych PSYCHIATRIC DISORDERS Anxiety Depression  Neuromuscular disease    GI/Hepatic GERD  Controlled,  Endo/Other    Renal/GU      Musculoskeletal  (+) Arthritis , Fibromyalgia -  Abdominal   Peds  Hematology   Anesthesia Other Findings   Reproductive/Obstetrics                             Anesthesia Physical Anesthesia Plan  ASA: III  Anesthesia Plan: MAC   Post-op Pain Management:    Induction:   Airway Management Planned:   Additional Equipment:   Intra-op Plan:   Post-operative Plan:   Informed Consent: I have reviewed the patients History and Physical, chart, labs and discussed the procedure including the risks, benefits and alternatives for the proposed anesthesia with the patient or authorized representative who has indicated his/her understanding and acceptance.     Plan Discussed with: CRNA  Anesthesia Plan Comments:         Anesthesia Quick Evaluation

## 2015-09-24 ENCOUNTER — Telehealth: Payer: Self-pay | Admitting: Family Medicine

## 2015-09-24 NOTE — Telephone Encounter (Signed)
Pt has appt on 09/25/15 at 10:45 with Dr Deborra Medina.

## 2015-09-24 NOTE — Telephone Encounter (Signed)
[facility] Patient Name: ASIANNA BRUNDAGE EN DOB: [birth year] Initial Comment caller states she has cough, congestion, pain in neck, shoulder, arm and right leg Nurse Assessment Nurse: Dimas Chyle, RN, Dellis Filbert Date/Time Eilene Ghazi Time): 09/24/2015 9:33:30 AM Confirm and document reason for call. If symptomatic, describe symptoms. You must click the next button to save text entered. ---Caller states she has cough, congestion, pain in neck, shoulder, arm and right leg. No fever. Symptoms started on Saturday. Has the patient traveled out of the country within the last 30 days? ---No Does the patient have any new or worsening symptoms? ---Yes Will a triage be completed? ---Yes Related visit to physician within the last 2 weeks? ---No Does the PT have any chronic conditions? (i.e. diabetes, asthma, etc.) ---No Is this a behavioral health or substance abuse call? ---No Guidelines Guideline Title Affirmed Question Affirmed Notes Cough - Acute Productive [1] Continuous (nonstop) coughing interferes with work or school AND [2] no improvement using cough treatment per Care Advice Final Disposition User See Physician within 24 Hours Dawson, RN, [employer] Referrals REFERRED TO PCP OFFICE Disagree/Comply: Leta Baptist

## 2015-09-25 ENCOUNTER — Other Ambulatory Visit: Payer: Self-pay

## 2015-09-25 ENCOUNTER — Encounter: Payer: Self-pay | Admitting: Family Medicine

## 2015-09-25 ENCOUNTER — Ambulatory Visit: Payer: Medicare Other

## 2015-09-25 ENCOUNTER — Ambulatory Visit (INDEPENDENT_AMBULATORY_CARE_PROVIDER_SITE_OTHER): Payer: Medicare Other | Admitting: Family Medicine

## 2015-09-25 VITALS — BP 132/64 | HR 76 | Temp 98.1°F | Wt 163.8 lb

## 2015-09-25 DIAGNOSIS — R05 Cough: Secondary | ICD-10-CM | POA: Diagnosis not present

## 2015-09-25 DIAGNOSIS — R059 Cough, unspecified: Secondary | ICD-10-CM

## 2015-09-25 MED ORDER — AZITHROMYCIN 250 MG PO TABS: ORAL_TABLET | ORAL | Status: DC

## 2015-09-25 NOTE — Progress Notes (Signed)
SUBJECTIVE:  Jamie Matthews is a 75 y.o. female who complains of congestion and dry cough for 4 day. She denies a history of anorexia, chest pain, fatigue, myalgias and shortness of breath and denies a history of asthma. Patient denies smoke cigarettes.   Current Outpatient Prescriptions on File Prior to Visit  Medication Sig Dispense Refill  . atorvastatin (LIPITOR) 20 MG tablet TAKE 1 TABLET BY MOUTH ONE TIME DAILY 90 tablet 1  . BIOTIN 5000 PO Take by mouth.    [name] buPROPion (WELLBUTRIN XL) 150 MG 24 hr tablet TAKE 1 TABLET EVERY DAY 30 tablet 5  . Calcium Carbonate Antacid (ANTACID PO) Take by mouth as needed.    . Ciclopirox 1 % shampoo USE AS DIRECTED 120 mL 0  . dexlansoprazole (DEXILANT) 60 MG capsule Take 1 capsule (60 mg total) by mouth daily. 30 capsule 5  . diphenhydrAMINE (BENADRYL) 25 MG tablet Take 50 mg by mouth daily.     . fluocinonide cream (LIDEX) 0.05 %     . lubiprostone (AMITIZA) 24 MCG capsule Take 1 capsule (24 mcg total) by mouth 2 (two) times daily with a meal. 60 capsule 5  . MAGNESIUM PO Take by mouth.    . memantine (NAMENDA) 10 MG tablet Take 1 tablet (10 mg total) by mouth 2 (two) times daily. 60 tablet 3  . mirabegron ER (MYRBETRIQ) 50 MG TB24 tablet Take 1 tablet (50 mg total) by mouth daily. (Patient taking differently: Take 4 mg by mouth daily. ) 30 tablet 3  . Multiple Vitamins-Minerals (CENTRUM PO) Take one by mouth daily    . ondansetron (ZOFRAN) 4 MG tablet Take 1 tablet (4 mg total) by mouth every 8 (eight) hours as needed for nausea or vomiting. 20 tablet 0  . polyethylene glycol (MIRALAX / GLYCOLAX) packet Take 17 g by mouth daily.    [name] Propylene Glycol (SYSTANE BALANCE OP) Apply to eye as needed.    . ranitidine (ZANTAC) 150 MG tablet Take 1 tablet (150 mg total) by mouth at bedtime. 30 tablet 5  . Sodium Chloride-Sodium Bicarb (AYR SALINE NASAL RINSE NA) Place into the nose.    . triamcinolone (NASACORT) 55 MCG/ACT nasal inhaler Place 2  sprays into the nose daily.    [name] venlafaxine XR (EFFEXOR-XR) 150 MG 24 hr capsule TAKE ONE CAPSULE DAILY 30 capsule 5   No current facility-administered medications on file prior to visit.    Allergies  Allergen Reactions  . Lipitor [Atorvastatin]     Itching   . Sulfa Antibiotics Rash    Past Medical History  Diagnosis Date  . Urinary incontinence   . Depression   . GERD (gastroesophageal reflux disease)   . Diverticulitis   . Hyperlipidemia   . Internal hemorrhoids   . Belching   . Constipation   . Arthritis   . Anxiety   . IBS (irritable bowel syndrome)   . Memory deficits   . Bowel dysfunction     BLOCKAGE  . Pneumonia 11/18/12  . Fibromyalgia     Past Surgical History  Procedure Laterality Date  . Cholecystectomy    . Tonsillectomy  1947  . Bladder suspension  2004, 2012  . Cataract extraction w/phaco Right 08/27/2015    Procedure: CATARACT EXTRACTION PHACO AND INTRAOCULAR LENS PLACEMENT ([facility]);  Surgeon: Estill Cotta, MD;  Location: [facility] ORS;  Service: Ophthalmology;  Laterality: Right;  [place]   1:00.2 AP%  22.5 CDE  23.67 fluid casette lot #[id number]  exp05/31/2018  Family History  Problem Relation Age of Onset  . Cancer Father   . Diabetes Father   . Heart disease Father   . Heart disease Mother   . Breast cancer Sister     Social History   Social History  . Marital Status: Married    Spouse Name: N/A  . Number of Children: N/A  . Years of Education: N/A   Occupational History  . Retired    Social History Main Topics  . Smoking status: Never Smoker   . Smokeless tobacco: Never Used  . Alcohol Use: No  . Drug Use: No  . Sexual Activity: Not Currently   Other Topics Concern  . Not on file   Social History Narrative   Recently moved with her husband to [place] from [place].   Husband is a retired Pharmacist, community.   No children.   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   The PMH, PSH, Social History, Family History, Medications,  and allergies have been reviewed in [facility], and have been updated if relevant.  OBJECTIVE: BP 132/64 mmHg  Pulse 76  Temp(Src) 98.1 F (36.7 C) (Oral)  Wt 163 lb 12 oz (74.277 kg)  SpO2 93%  She appears well, vital signs are as noted. Ears normal.  Throat and pharynx normal.  Neck supple. No adenopathy in the neck. Nose is congested. Sinuses tender. Scattered rhonchi  ASSESSMENT:  Bronchitis/sinusitis  PLAN: Given duration and progression of symptoms with abnormal lung exam, will treat for bacterial process with zpack.   Symptomatic therapy suggested: push fluids, rest and return office visit prn if symptoms persist or worsen.Call or return to clinic prn if these symptoms worsen or fail to improve as anticipated.

## 2015-09-25 NOTE — Progress Notes (Signed)
Pre visit review using our clinic review tool, if applicable. No additional management support is needed unless otherwise documented below in the visit note. 

## 2015-09-25 NOTE — Patient Instructions (Signed)
Take antibiotic as directed.  Drink lots of fluids.    Treat sympotmatically with Mucinex, nasal saline irrigation, and Tylenol/Ibuprofen.   You can use warm compresses.  Cough suppressant at night.   Call if not improving as expected in 5-7 days.

## 2015-09-27 ENCOUNTER — Other Ambulatory Visit: Payer: Self-pay

## 2015-09-27 ENCOUNTER — Ambulatory Visit: Payer: Medicare Other

## 2015-09-27 DIAGNOSIS — M1712 Unilateral primary osteoarthritis, left knee: Secondary | ICD-10-CM | POA: Diagnosis not present

## 2015-10-02 ENCOUNTER — Ambulatory Visit (INDEPENDENT_AMBULATORY_CARE_PROVIDER_SITE_OTHER): Payer: Medicare Other | Admitting: Family Medicine

## 2015-10-02 ENCOUNTER — Encounter: Payer: Self-pay | Admitting: Family Medicine

## 2015-10-02 VITALS — BP 126/74 | HR 69 | Temp 97.5°F | Wt 163.2 lb

## 2015-10-02 DIAGNOSIS — J069 Acute upper respiratory infection, unspecified: Secondary | ICD-10-CM

## 2015-10-02 DIAGNOSIS — K219 Gastro-esophageal reflux disease without esophagitis: Secondary | ICD-10-CM

## 2015-10-02 NOTE — Assessment & Plan Note (Signed)
Reassurance provided. Lungs clear today. Continue supportive care. Call or return to clinic prn if these symptoms worsen or fail to improve as anticipated. The patient indicates understanding of these issues and agrees with the plan.

## 2015-10-02 NOTE — Progress Notes (Signed)
Pre visit review using our clinic review tool, if applicable. No additional management support is needed unless otherwise documented below in the visit note. 

## 2015-10-02 NOTE — Patient Instructions (Signed)
Great to see you. Please start taking Xantac for breakthrough symptoms.  Your lungs sound great!

## 2015-10-02 NOTE — Assessment & Plan Note (Signed)
Deteriorated but has not been taking H2 blocker. Advised to add Xantac at least for breakthrough symptoms and keep follow up appt with GI already scheduled. The patient indicates understanding of these issues and agrees with the plan.

## 2015-10-02 NOTE — Progress Notes (Signed)
Subjective:   Patient ID: Jamie Matthews, female    DOB: [birth year], 75 y.o.   MRN: [id number]  Cooper Stamp is a pleasant 75 y.o. year old female who presents to clinic today with Follow-up and Gastroesophageal Reflux  on 10/02/2015  HPI: URI- saw her on 09/25/15 for URI symptoms. Note reviewed. Given duration of symptoms and findings on exam, treated with zpack.  Cough has improved but still has a little cough and fatigue.  No fever.  GERD- feels symptoms are not well controlled with Dexilant.  Followed by GI.  Norva Karvonen is also on her med list but she admits to not taking it.   Current Outpatient Prescriptions on File Prior to Visit  Medication Sig Dispense Refill  . atorvastatin (LIPITOR) 20 MG tablet TAKE 1 TABLET BY MOUTH ONE TIME DAILY 90 tablet 1  . BIOTIN 5000 PO Take by mouth.    [name] buPROPion (WELLBUTRIN XL) 150 MG 24 hr tablet TAKE 1 TABLET EVERY DAY 30 tablet 5  . Calcium Carbonate Antacid (ANTACID PO) Take by mouth as needed.    . Ciclopirox 1 % shampoo USE AS DIRECTED 120 mL 0  . dexlansoprazole (DEXILANT) 60 MG capsule Take 1 capsule (60 mg total) by mouth daily. 30 capsule 5  . diphenhydrAMINE (BENADRYL) 25 MG tablet Take 50 mg by mouth daily.     . fluocinonide cream (LIDEX) 0.05 %     . lubiprostone (AMITIZA) 24 MCG capsule Take 1 capsule (24 mcg total) by mouth 2 (two) times daily with a meal. 60 capsule 5  . MAGNESIUM PO Take by mouth.    . memantine (NAMENDA) 10 MG tablet Take 1 tablet (10 mg total) by mouth 2 (two) times daily. 60 tablet 3  . mirabegron ER (MYRBETRIQ) 50 MG TB24 tablet Take 1 tablet (50 mg total) by mouth daily. (Patient taking differently: Take 4 mg by mouth daily. ) 30 tablet 3  . Multiple Vitamins-Minerals (CENTRUM PO) Take one by mouth daily    . ondansetron (ZOFRAN) 4 MG tablet Take 1 tablet (4 mg total) by mouth every 8 (eight) hours as needed for nausea or vomiting. 20 tablet 0  . polyethylene glycol (MIRALAX /  GLYCOLAX) packet Take 17 g by mouth daily.    [name] Propylene Glycol (SYSTANE BALANCE OP) Apply to eye as needed.    . ranitidine (ZANTAC) 150 MG tablet Take 1 tablet (150 mg total) by mouth at bedtime. 30 tablet 5  . Sodium Chloride-Sodium Bicarb (AYR SALINE NASAL RINSE NA) Place into the nose.    . triamcinolone (NASACORT) 55 MCG/ACT nasal inhaler Place 2 sprays into the nose daily.    [name] venlafaxine XR (EFFEXOR-XR) 150 MG 24 hr capsule TAKE ONE CAPSULE DAILY 30 capsule 5   No current facility-administered medications on file prior to visit.    Allergies  Allergen Reactions  . Lipitor [Atorvastatin]     Itching   . Sulfa Antibiotics Rash    Past Medical History  Diagnosis Date  . Urinary incontinence   . Depression   . GERD (gastroesophageal reflux disease)   . Diverticulitis   . Hyperlipidemia   . Internal hemorrhoids   . Belching   . Constipation   . Arthritis   . Anxiety   . IBS (irritable bowel syndrome)   . Memory deficits   . Bowel dysfunction     BLOCKAGE  . Pneumonia 11/18/12  . Fibromyalgia     Past Surgical History  Procedure Laterality Date  .  Cholecystectomy    . Tonsillectomy  1947  . Bladder suspension  2004, 2012  . Cataract extraction w/phaco Right 08/27/2015    Procedure: CATARACT EXTRACTION PHACO AND INTRAOCULAR LENS PLACEMENT (IOC);  Surgeon: Estill Cotta, MD;  Location: [facility] ORS;  Service: Ophthalmology;  Laterality: Right;  [place]   1:00.2 AP%  22.5 CDE  23.67 fluid casette lot #[id number]  exp05/31/2018    Family History  Problem Relation Age of Onset  . Cancer Father   . Diabetes Father   . Heart disease Father   . Heart disease Mother   . Breast cancer Sister     Social History   Social History  . Marital Status: Married    Spouse Name: N/A  . Number of Children: N/A  . Years of Education: N/A   Occupational History  . Retired    Social History Main Topics  . Smoking status: Never Smoker   . Smokeless tobacco: Never Used  .  Alcohol Use: No  . Drug Use: No  . Sexual Activity: Not Currently   Other Topics Concern  . Not on file   Social History Narrative   Recently moved with her husband to [place] from [place].   Husband is a retired Pharmacist, community.   No children.   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   The PMH, PSH, Social History, Family History, Medications, and allergies have been reviewed in [facility], and have been updated if relevant.   Review of Systems  Constitutional: Negative.   HENT: Negative for congestion, sinus pressure, sneezing and sore throat.   Eyes: Negative.   Respiratory: Positive for cough. Negative for shortness of breath, wheezing and stridor.   Cardiovascular: Negative.   Gastrointestinal: Negative for nausea, abdominal pain, diarrhea, constipation and abdominal distention.       +GERD symptoms  Musculoskeletal: Negative.   All other systems reviewed and are negative.      Objective:    BP 126/74 mmHg  Pulse 69  Temp(Src) 97.5 F (36.4 C) (Oral)  Wt 163 lb 4 oz (74.05 kg)  SpO2 98%   Physical Exam  Constitutional: She is oriented to person, place, and time. She appears well-developed and well-nourished. No distress.  HENT:  Head: Normocephalic.  Eyes: Conjunctivae are normal.  Cardiovascular: Normal rate and regular rhythm.   Pulmonary/Chest: Effort normal and breath sounds normal. No respiratory distress. She has no wheezes.  Neurological: She is alert and oriented to person, place, and time. No cranial nerve deficit.  Skin: Skin is warm and dry. She is not diaphoretic.  Psychiatric: She has a normal mood and affect. Her behavior is normal. Judgment and thought content normal.  Nursing note and vitals reviewed.         Assessment & Plan:   Gastroesophageal reflux disease, esophagitis presence not specified  Acute upper respiratory infection No Follow-up on file.

## 2015-10-05 ENCOUNTER — Other Ambulatory Visit: Payer: Self-pay | Admitting: Family Medicine

## 2015-10-09 DIAGNOSIS — M1712 Unilateral primary osteoarthritis, left knee: Secondary | ICD-10-CM | POA: Diagnosis not present

## 2015-10-10 ENCOUNTER — Ambulatory Visit
Admission: RE | Admit: 2015-10-10 | Discharge: 2015-10-10 | Disposition: A | Discharge status: Home / Self Care | Payer: Medicare Other | Source: Ambulatory Visit | Attending: Family Medicine | Admitting: Family Medicine

## 2015-10-10 ENCOUNTER — Other Ambulatory Visit: Payer: Self-pay | Admitting: Family Medicine

## 2015-10-10 DIAGNOSIS — R921 Mammographic calcification found on diagnostic imaging of breast: Secondary | ICD-10-CM

## 2015-10-10 DIAGNOSIS — H2512 Age-related nuclear cataract, left eye: Secondary | ICD-10-CM | POA: Diagnosis not present

## 2015-10-11 ENCOUNTER — Encounter: Payer: Self-pay | Admitting: *Deleted

## 2015-10-14 NOTE — H&P (Signed)
See scanned note.

## 2015-10-15 ENCOUNTER — Encounter
Admission: RE | Disposition: A | Discharge status: Home / Self Care | Payer: Self-pay | Source: Ambulatory Visit | Attending: Ophthalmology

## 2015-10-15 ENCOUNTER — Encounter: Payer: Self-pay | Admitting: *Deleted

## 2015-10-15 ENCOUNTER — Ambulatory Visit: Payer: Medicare Other | Admitting: Anesthesiology

## 2015-10-15 ENCOUNTER — Ambulatory Visit
Admission: RE | Admit: 2015-10-15 | Discharge: 2015-10-15 | Disposition: A | Discharge status: Home / Self Care | Payer: Medicare Other | Source: Ambulatory Visit | Attending: Ophthalmology | Admitting: Ophthalmology

## 2015-10-15 DIAGNOSIS — F418 Other specified anxiety disorders: Secondary | ICD-10-CM | POA: Diagnosis not present

## 2015-10-15 DIAGNOSIS — K219 Gastro-esophageal reflux disease without esophagitis: Secondary | ICD-10-CM | POA: Diagnosis not present

## 2015-10-15 DIAGNOSIS — Z9049 Acquired absence of other specified parts of digestive tract: Secondary | ICD-10-CM | POA: Insufficient documentation

## 2015-10-15 DIAGNOSIS — M797 Fibromyalgia: Secondary | ICD-10-CM | POA: Insufficient documentation

## 2015-10-15 DIAGNOSIS — Z9841 Cataract extraction status, right eye: Secondary | ICD-10-CM | POA: Insufficient documentation

## 2015-10-15 DIAGNOSIS — H2512 Age-related nuclear cataract, left eye: Secondary | ICD-10-CM | POA: Insufficient documentation

## 2015-10-15 DIAGNOSIS — M170 Bilateral primary osteoarthritis of knee: Secondary | ICD-10-CM | POA: Insufficient documentation

## 2015-10-15 DIAGNOSIS — R413 Other amnesia: Secondary | ICD-10-CM | POA: Insufficient documentation

## 2015-10-15 DIAGNOSIS — N3281 Overactive bladder: Secondary | ICD-10-CM | POA: Diagnosis not present

## 2015-10-15 DIAGNOSIS — F329 Major depressive disorder, single episode, unspecified: Secondary | ICD-10-CM | POA: Diagnosis not present

## 2015-10-15 DIAGNOSIS — Z882 Allergy status to sulfonamides status: Secondary | ICD-10-CM | POA: Diagnosis not present

## 2015-10-15 DIAGNOSIS — E7800 Pure hypercholesterolemia, unspecified: Secondary | ICD-10-CM | POA: Diagnosis not present

## 2015-10-15 HISTORY — PX: CATARACT EXTRACTION W/PHACO: SHX586

## 2015-10-15 HISTORY — DX: Bladder disorder, unspecified: N32.9

## 2015-10-15 SURGERY — PHACOEMULSIFICATION, CATARACT, WITH IOL INSERTION
Anesthesia: Monitor Anesthesia Care | Site: Eye | Laterality: Left | Wound class: Clean

## 2015-10-15 MED ORDER — TETRACAINE HCL 0.5 % OP SOLN
OPHTHALMIC | Status: AC
  Filled 2015-10-15: qty 2

## 2015-10-15 MED ORDER — LIDOCAINE HCL (PF) 4 % IJ SOLN
INTRAMUSCULAR | Status: DC | PRN
  Administered 2015-10-15: 4 mL via OPHTHALMIC

## 2015-10-15 MED ORDER — PHENYLEPHRINE HCL 10 % OP SOLN
1.0000 [drp] | OPHTHALMIC | Status: DC | PRN
  Administered 2015-10-15 (×3): 1 [drp] via OPHTHALMIC

## 2015-10-15 MED ORDER — LIDOCAINE HCL (PF) 4 % IJ SOLN
INTRAMUSCULAR | Status: AC
  Filled 2015-10-15: qty 5

## 2015-10-15 MED ORDER — CEFUROXIME OPHTHALMIC INJECTION 1 MG/0.1 ML
INJECTION | OPHTHALMIC | Status: AC
  Filled 2015-10-15: qty 0.1

## 2015-10-15 MED ORDER — POVIDONE-IODINE 5 % OP SOLN
OPHTHALMIC | Status: DC | PRN
  Administered 2015-10-15: 1 via OPHTHALMIC

## 2015-10-15 MED ORDER — PHENYLEPHRINE HCL 10 % OP SOLN
OPHTHALMIC | Status: AC
  Administered 2015-10-15: 1 [drp] via OPHTHALMIC
  Filled 2015-10-15: qty 5

## 2015-10-15 MED ORDER — ALFENTANIL 500 MCG/ML IJ INJ
INJECTION | INTRAMUSCULAR | Status: DC | PRN
  Administered 2015-10-15: 500 ug via INTRAVENOUS

## 2015-10-15 MED ORDER — LIDOCAINE HCL (PF) 4 % IJ SOLN
INTRAOCULAR | Status: DC | PRN
  Administered 2015-10-15: .5 mL via OPHTHALMIC

## 2015-10-15 MED ORDER — MOXIFLOXACIN HCL 0.5 % OP SOLN
1.0000 [drp] | OPHTHALMIC | Status: DC | PRN
Start: 2015-10-15 — End: 2015-10-15
  Administered 2015-10-15 (×3): 1 [drp] via OPHTHALMIC

## 2015-10-15 MED ORDER — TETRACAINE HCL 0.5 % OP SOLN
OPHTHALMIC | Status: DC | PRN
  Administered 2015-10-15: 1 [drp] via OPHTHALMIC

## 2015-10-15 MED ORDER — EPINEPHRINE HCL 1 MG/ML IJ SOLN
INTRAOCULAR | Status: DC | PRN
  Administered 2015-10-15: 1 mL via OPHTHALMIC

## 2015-10-15 MED ORDER — MOXIFLOXACIN HCL 0.5 % OP SOLN
OPHTHALMIC | Status: AC
  Administered 2015-10-15: 1 [drp] via OPHTHALMIC
  Filled 2015-10-15: qty 3

## 2015-10-15 MED ORDER — NA CHONDROIT SULF-NA HYALURON 40-17 MG/ML IO SOLN
INTRAOCULAR | Status: AC
  Filled 2015-10-15: qty 1

## 2015-10-15 MED ORDER — BUPIVACAINE HCL (PF) 0.75 % IJ SOLN
INTRAMUSCULAR | Status: AC
  Filled 2015-10-15: qty 10

## 2015-10-15 MED ORDER — CYCLOPENTOLATE HCL 2 % OP SOLN
OPHTHALMIC | Status: AC
  Administered 2015-10-15: 1 [drp] via OPHTHALMIC
  Filled 2015-10-15: qty 2

## 2015-10-15 MED ORDER — CEFUROXIME OPHTHALMIC INJECTION 1 MG/0.1 ML
INJECTION | OPHTHALMIC | Status: DC | PRN
  Administered 2015-10-15: .1 mL via INTRACAMERAL

## 2015-10-15 MED ORDER — CARBACHOL 0.01 % IO SOLN
INTRAOCULAR | Status: DC | PRN
  Administered 2015-10-15: .5 mL via INTRAOCULAR

## 2015-10-15 MED ORDER — MOXIFLOXACIN HCL 0.5 % OP SOLN
OPHTHALMIC | Status: DC | PRN
  Administered 2015-10-15: 1 [drp] via OPHTHALMIC

## 2015-10-15 MED ORDER — EPINEPHRINE HCL 1 MG/ML IJ SOLN
INTRAMUSCULAR | Status: AC
  Filled 2015-10-15: qty 2

## 2015-10-15 MED ORDER — NA CHONDROIT SULF-NA HYALURON 40-17 MG/ML IO SOLN
INTRAOCULAR | Status: DC | PRN
  Administered 2015-10-15: 1 mL via INTRAOCULAR

## 2015-10-15 MED ORDER — CYCLOPENTOLATE HCL 2 % OP SOLN
1.0000 [drp] | OPHTHALMIC | Status: DC | PRN
  Administered 2015-10-15 (×3): 1 [drp] via OPHTHALMIC

## 2015-10-15 MED ORDER — HYALURONIDASE HUMAN 150 UNIT/ML IJ SOLN
INTRAMUSCULAR | Status: AC
  Filled 2015-10-15: qty 1

## 2015-10-15 MED ORDER — SODIUM CHLORIDE 0.9 % IV SOLN
INTRAVENOUS | Status: DC
  Administered 2015-10-15: 10:00:00 via INTRAVENOUS

## 2015-10-15 SURGICAL SUPPLY — 30 items
CANNULA ANT/CHMB 27GA (MISCELLANEOUS) ×3 IMPLANT
CORD BIP STRL DISP 12FT (MISCELLANEOUS) ×3 IMPLANT
CUP MEDICINE 2OZ PLAST GRAD ST (MISCELLANEOUS) ×3 IMPLANT
DRAPE XRAY CASSETTE 23X24 (DRAPES) ×3 IMPLANT
ERASER HMR WETFIELD 18G (MISCELLANEOUS) ×3 IMPLANT
GLOVE BIO SURGEON STRL SZ8 (GLOVE) ×3 IMPLANT
GLOVE SURG LX 6.5 MICRO (GLOVE) ×2
GLOVE SURG LX 8.0 MICRO (GLOVE) ×2
GLOVE SURG LX STRL 6.5 MICRO (GLOVE) ×1 IMPLANT
GLOVE SURG LX STRL 8.0 MICRO (GLOVE) ×1 IMPLANT
GOWN STRL REUS W/ TWL LRG LVL3 (GOWN DISPOSABLE) ×1 IMPLANT
GOWN STRL REUS W/ TWL XL LVL3 (GOWN DISPOSABLE) ×1 IMPLANT
GOWN STRL REUS W/TWL LRG LVL3 (GOWN DISPOSABLE) ×2
GOWN STRL REUS W/TWL XL LVL3 (GOWN DISPOSABLE) ×2
LENS IOL ACRSF IQ ULTRA 17.0 (Intraocular Lens) ×1 IMPLANT
LENS IOL ACRYSOF IQ 17.0 (Intraocular Lens) ×3 IMPLANT
PACK CATARACT (MISCELLANEOUS) ×3 IMPLANT
PACK CATARACT DINGLEDEIN LX (MISCELLANEOUS) ×3 IMPLANT
PACK EYE AFTER SURG (MISCELLANEOUS) ×3 IMPLANT
SHLD EYE VISITEC  UNIV (MISCELLANEOUS) ×3 IMPLANT
SOL BSS BAG (MISCELLANEOUS) ×3
SOL PREP PVP 2OZ (MISCELLANEOUS) ×3
SOLUTION BSS BAG (MISCELLANEOUS) ×1 IMPLANT
SOLUTION PREP PVP 2OZ (MISCELLANEOUS) ×1 IMPLANT
SUT SILK 5-0 (SUTURE) ×3 IMPLANT
SYR 3ML LL SCALE MARK (SYRINGE) ×3 IMPLANT
SYR 5ML LL (SYRINGE) ×3 IMPLANT
SYR TB 1ML 27GX1/2 LL (SYRINGE) ×3 IMPLANT
WATER STERILE IRR 1000ML POUR (IV SOLUTION) ×3 IMPLANT
WIPE NON LINTING 3.25X3.25 (MISCELLANEOUS) ×3 IMPLANT

## 2015-10-15 NOTE — Interval H&P Note (Signed)
History and Physical Interval Note:  10/15/2015 11:13 AM  Jamie Matthews  has presented today for surgery, with the diagnosis of cataract  The various methods of treatment have been discussed with the patient and family. After consideration of risks, benefits and other options for treatment, the patient has consented to  Procedure(s): CATARACT EXTRACTION PHACO AND INTRAOCULAR LENS PLACEMENT ([place]) (Left) as a surgical intervention .  The patient's history has been reviewed, patient examined, no change in status, stable for surgery.  I have reviewed the patient's chart and labs.  Questions were answered to the patient's satisfaction.     Kimie Pidcock

## 2015-10-15 NOTE — Op Note (Signed)
Date of Surgery: 10/15/2015 Date of Dictation: 10/15/2015 11:53 AM Pre-operative Diagnosis:  Nuclear Sclerotic Cataract left Eye Post-operative Diagnosis: same Procedure performed: Extra-capsular Cataract Extraction (ECCE) with placement of a posterior chamber intraocular lens (IOL) left Eye IOL:  Implant Name Type Inv. Item Serial No. Manufacturer Lot No. LRB No. Used  LENS IOL ACRYSOF IQ 17.0 - [id number] Intraocular Lens LENS IOL ACRYSOF IQ 17.0 [id number] ALCON   Left 1   Anesthesia: 2% Lidocaine and 4% Marcaine in a 50/50 mixture with 10 unites/ml of Hylenex given as a peribulbar Anesthesiologist: Anesthesiologist: Gunnar Fusi, MD CRNA: Silvana Newness, CRNA Complications: none Estimated Blood Loss: less than 1 ml  Description of procedure:  The patient was given anesthesia and sedation via intravenous access. The patient was then prepped and draped in the usual fashion. A 25-gauge needle was bent for initiating the capsulorhexis. A 5-0 silk suture was placed through the conjunctiva superior and inferiorly to serve as bridle sutures. Hemostasis was obtained at the superior limbus using an eraser cautery. A partial thickness groove was made at the anterior surgical limbus with a 64 Beaver blade and this was dissected anteriorly with an Avaya. The anterior chamber was entered at 10 o'clock with a 1.0 mm paracentesis knife and through the lamellar dissection with a 2.6 mm Alcon keratome. Epi-Shugarcaine 0.5 CC [9 cc BSS Plus (Alcon), 3 cc 4% preservative-free lidocaine (Hospira) and 4 cc 1:1000 preservative-free, bisulfite-free epinephrine] was injected into the anterior chamber via the paracentesis tract. Epi-Shugarcaine 0.5 CC [9 cc BSS Plus (Alcon), 3 cc 4% preservative-free lidocaine (Hospira) and 4 cc 1:1000 preservative-free, bisulfite-free epinephrine] was injected into the anterior chamber via the paracentesis tract. DiscoVisc was injected to replace the aqueous and a  continuous tear curvilinear capsulorhexis was performed using a bent 25-gauge needle.  Balance salt on a syringe was used to perform hydro-dissection and phacoemulsification was carried out using a divide and conquer technique. Procedure(s) with comments: CATARACT EXTRACTION PHACO AND INTRAOCULAR LENS PLACEMENT (IOC) (Left) - [place] 01:07 AP% 18.1 CDE 21.57 fluid pack lot # [id number]. Irrigation/aspiration was used to remove the residual cortex and the capsular bag was inflated with DiscoVisc. The intraocular lens was inserted into the capsular bag using a pre-loaded UltraSert Delivery System. Irrigation/aspiration was used to remove the residual DiscoVisc. The wound was inflated with balanced salt and checked for leaks. None were found. Miostat was injected via the paracentesis track and 0.1 ml of cefuroxime containing 1 mg of drug  was injected via the paracentesis track. The wound was checked for leaks again and none were found.   The bridal sutures were removed and two drops of Vigamox were placed on the eye. An eye shield was placed to protect the eye and the patient was discharged to the recovery area in good condition.   Chrys Landgrebe MD

## 2015-10-15 NOTE — Discharge Instructions (Signed)
Eye Surgery Discharge Instructions  Expect mild scratchy sensation or mild soreness. DO NOT RUB YOUR EYE!  The day of surgery:  Minimal physical activity, but bed rest is not required  No reading, computer work, or close hand work  No bending, lifting, or straining.  May watch TV  For 24 hours:  No driving, legal decisions, or alcoholic beverages  Safety precautions  Eat anything you prefer: It is better to start with liquids, then soup then solid foods.  _____ Eye patch should be worn until postoperative exam tomorrow.  ____ Solar shield eyeglasses should be worn for comfort in the sunlight/patch while sleeping  Resume all regular medications including aspirin or Coumadin if these were discontinued prior to surgery. You may shower, bathe, shave, or wash your hair. Tylenol may be taken for mild discomfort.  Call your doctor if you experience significant pain, nausea, or vomiting, fever > 101 or other signs of infection. [phone] or [phone] Specific instructions:  Follow-up Information    Follow up with Estill Cotta, MD.   Specialty:  Ophthalmology   Why:  follow up 5/23 at [facility] information:   [place] [address] [phone]     AMBULATORY SURGERY  DISCHARGE INSTRUCTIONS   1) The drugs that you were given will stay in your system until tomorrow so for the next 24 hours you should not:  A) Drive an automobile B) Make any legal decisions C) Drink any alcoholic beverage   2) You may resume regular meals tomorrow.  Today it is better to start with liquids and gradually work up to solid foods.  You may eat anything you prefer, but it is better to start with liquids, then soup and crackers, and gradually work up to solid foods.   3) Please notify your doctor immediately if you have any unusual bleeding, trouble breathing, redness and pain at the surgery site, drainage, fever, or pain not relieved by  medication.    4) Additional Instructions:        Please contact your physician with any problems or Same Day Surgery at [phone], Monday through Friday 6 am to 4 pm, or [place] [facility] number at [phone].

## 2015-10-15 NOTE — Anesthesia Postprocedure Evaluation (Signed)
Anesthesia Post Note  Patient: Jamie Matthews  Procedure(s) Performed: Procedure(s) (LRB): CATARACT EXTRACTION PHACO AND INTRAOCULAR LENS PLACEMENT (I[facility]) (Left)  Patient location during evaluation: PACU Anesthesia Type: MAC Level of consciousness: awake and alert and oriented Pain management: satisfactory to patient Vital Signs Assessment: post-procedure vital signs reviewed and stable Respiratory status: respiratory function stable Cardiovascular status: stable Postop Assessment: no signs of nausea or vomiting Anesthetic complications: no    Last Vitals:  Filed Vitals:   10/15/15 1005  BP: 103/59  Pulse: 58  Temp: 36.8 C  Resp: 18    Last Pain: There were no vitals filed for this visit.               Silvana Newness A

## 2015-10-15 NOTE — Transfer of Care (Signed)
Immediate Anesthesia Transfer of Care Note  Patient: Jamie Matthews  Procedure(s) Performed: Procedure(s) with comments: CATARACT EXTRACTION PHACO AND INTRAOCULAR LENS PLACEMENT ([facility]) (Left) - [place] 01:07 AP% 18.1 CDE 21.57 fluid pack lot # [id number]  Patient Location: PACU  Anesthesia Type:MAC  Level of Consciousness: awake, alert , oriented and patient cooperative  Airway & Oxygen Therapy: Patient Spontanous Breathing  Post-op Assessment: Report given to RN, Post -op Vital signs reviewed and stable and Patient moving all extremities X 4  Post vital signs: Reviewed and stable  Last Vitals:  Filed Vitals:   10/15/15 1005  BP: 103/59  Pulse: 58  Temp: 36.8 C  Resp: 18    Last Pain: There were no vitals filed for this visit.       Complications: No apparent anesthesia complications

## 2015-10-15 NOTE — Anesthesia Preprocedure Evaluation (Signed)
Anesthesia Evaluation  Patient identified by MRN, date of birth, ID band Patient awake    Reviewed: Allergy & Precautions, NPO status , Patient's Chart, lab work & pertinent test results  History of Anesthesia Complications Negative for: history of anesthetic complications  Airway Mallampati: III       Dental   Pulmonary neg pulmonary ROS,           Cardiovascular negative cardio ROS       Neuro/Psych Anxiety Depression negative neurological ROS     GI/Hepatic negative GI ROS, Neg liver ROS, GERD  Medicated and Poorly Controlled,  Endo/Other  negative endocrine ROS  Renal/GU negative Renal ROS     Musculoskeletal  (+) Arthritis , Osteoarthritis,  Fibromyalgia -  Abdominal   Peds  Hematology   Anesthesia Other Findings   Reproductive/Obstetrics                             Anesthesia Physical Anesthesia Plan  ASA: II  Anesthesia Plan: MAC   Post-op Pain Management:    Induction: Intravenous  Airway Management Planned: Nasal Cannula  Additional Equipment:   Intra-op Plan:   Post-operative Plan:   Informed Consent: I have reviewed the patients History and Physical, chart, labs and discussed the procedure including the risks, benefits and alternatives for the proposed anesthesia with the patient or authorized representative who has indicated his/her understanding and acceptance.     Plan Discussed with:   Anesthesia Plan Comments:         Anesthesia Quick Evaluation

## 2015-10-16 ENCOUNTER — Ambulatory Visit: Payer: Self-pay | Admitting: Family Medicine

## 2015-10-23 ENCOUNTER — Encounter: Payer: Self-pay | Admitting: Family Medicine

## 2015-10-23 ENCOUNTER — Ambulatory Visit (INDEPENDENT_AMBULATORY_CARE_PROVIDER_SITE_OTHER): Payer: Medicare Other | Admitting: Family Medicine

## 2015-10-23 ENCOUNTER — Other Ambulatory Visit: Payer: Self-pay | Admitting: Family Medicine

## 2015-10-23 VITALS — BP 112/62 | HR 41 | Temp 97.6°F | Wt 168.5 lb

## 2015-10-23 DIAGNOSIS — J069 Acute upper respiratory infection, unspecified: Secondary | ICD-10-CM

## 2015-10-23 DIAGNOSIS — K219 Gastro-esophageal reflux disease without esophagitis: Secondary | ICD-10-CM

## 2015-10-23 NOTE — Assessment & Plan Note (Signed)
Symptoms resolving. Lung exam reassuring Cough likely more due to GERD at this point. See below.

## 2015-10-23 NOTE — Progress Notes (Signed)
Pre visit review using our clinic review tool, if applicable. No additional management support is needed unless otherwise documented below in the visit note. 

## 2015-10-23 NOTE — Assessment & Plan Note (Signed)
Deteriorated. Again discussed taking Xantax daily. Given GERD diet handout as she admits to being non compliant with diet. Follow up with GI. The patient indicates understanding of these issues and agrees with the plan.

## 2015-10-23 NOTE — Patient Instructions (Signed)
Great to see you. Please schedule your xantax in the evening.  Food Choices for Gastroesophageal Reflux Disease, Adult When you have gastroesophageal reflux disease (GERD), the foods you eat and your eating habits are very important. Choosing the right foods can help ease your discomfort.  WHAT GUIDELINES DO I NEED TO FOLLOW?   Choose fruits, vegetables, whole grains, and low-fat dairy products.   Choose low-fat meat, fish, and poultry.  Limit fats such as oils, salad dressings, butter, nuts, and avocado.   Keep a food diary. This helps you identify foods that cause symptoms.   Avoid foods that cause symptoms. These may be different for everyone.   Eat small meals often instead of 3 large meals a day.   Eat your meals slowly, in a place where you are relaxed.   Limit fried foods.   Cook foods using methods other than frying.   Avoid drinking alcohol.   Avoid drinking large amounts of liquids with your meals.   Avoid bending over or lying down until 2-3 hours after eating.  WHAT FOODS ARE NOT RECOMMENDED?  These are some foods and drinks that may make your symptoms worse: Vegetables Tomatoes. Tomato juice. Tomato and spaghetti sauce. Chili peppers. Onion and garlic. Horseradish. Fruits Oranges, grapefruit, and lemon (fruit and juice). Meats High-fat meats, fish, and poultry. This includes hot dogs, ribs, ham, sausage, salami, and bacon. Dairy Whole milk and chocolate milk. Sour cream. Cream. Butter. Ice cream. Cream cheese.  Drinks Coffee and tea. Bubbly (carbonated) drinks or energy drinks. Condiments Hot sauce. Barbecue sauce.  Sweets/Desserts Chocolate and cocoa. Donuts. Peppermint and spearmint. Fats and Oils High-fat foods. This includes [place] fries and potato chips. Other Vinegar. Strong spices. This includes black pepper, white pepper, red pepper, cayenne, curry powder, cloves, ginger, and chili powder. The items listed above may not be a complete  list of foods and drinks to avoid. Contact your dietitian for more information.   This information is not intended to replace advice given to you by your health care provider. Make sure you discuss any questions you have with your health care provider.   Document Released: 11/11/2011 Document Revised: 06/02/2014 Document Reviewed: 03/16/2013 Elsevier Interactive Patient Education [employer].

## 2015-10-23 NOTE — Progress Notes (Signed)
Subjective:   Patient ID: Jamie Matthews, female    DOB: December 10, 1940, 75 y.o.   MRN: [id number]  Jamie Matthews is a pleasant 75 y.o. year old female who presents to clinic today with Follow-up  on 10/23/2015  HPI: URI- saw her on 09/25/15 for URI symptoms. Note reviewed. Given duration of symptoms and findings on exam, treated with zpack.  Cough has improved but still has a lot of mucus at night.  GERD- feels symptoms are not well controlled with Dexilant.  Followed by GI.  Norva Karvonen is also on her med list but she admits to not taking it although we did discuss this at length last time.   Current Outpatient Prescriptions on File Prior to Visit  Medication Sig Dispense Refill  . atorvastatin (LIPITOR) 20 MG tablet TAKE 1 TABLET BY MOUTH ONE TIME DAILY 90 tablet 1  . BIOTIN 5000 PO Take by mouth.    [name] buPROPion (WELLBUTRIN XL) 150 MG 24 hr tablet TAKE 1 TABLET EVERY DAY 30 tablet 5  . Calcium Carbonate Antacid (ANTACID PO) Take by mouth as needed.    [name] dexlansoprazole (DEXILANT) 60 MG capsule Take 1 capsule (60 mg total) by mouth daily. 30 capsule 5  . diphenhydrAMINE (BENADRYL) 25 MG tablet Take 50 mg by mouth daily.     . fluocinonide cream (LIDEX) 0.05 % Reported on 10/15/2015    . lubiprostone (AMITIZA) 24 MCG capsule Take 1 capsule (24 mcg total) by mouth 2 (two) times daily with a meal. 60 capsule 5  . MAGNESIUM PO Take by mouth.    . memantine (NAMENDA) 10 MG tablet Take 1 tablet (10 mg total) by mouth 2 (two) times daily. 60 tablet 3  . Multiple Vitamins-Minerals (CENTRUM PO) Take one by mouth daily    . MYRBETRIQ 50 MG TB24 tablet TAKE 1 TABLET EVERY DAY 30 tablet 5  . ondansetron (ZOFRAN) 4 MG tablet Take 1 tablet (4 mg total) by mouth every 8 (eight) hours as needed for nausea or vomiting. 20 tablet 0  . polyethylene glycol (MIRALAX / GLYCOLAX) packet Take 17 g by mouth daily.    [name] Propylene Glycol (SYSTANE BALANCE OP) Apply to eye as needed.    .  ranitidine (ZANTAC) 150 MG tablet Take 1 tablet (150 mg total) by mouth at bedtime. 30 tablet 5  . Sodium Chloride-Sodium Bicarb (AYR SALINE NASAL RINSE NA) Place into the nose.    . triamcinolone (NASACORT) 55 MCG/ACT nasal inhaler Place 2 sprays into the nose daily.    [name] venlafaxine XR (EFFEXOR-XR) 150 MG 24 hr capsule TAKE ONE CAPSULE DAILY 30 capsule 5   No current facility-administered medications on file prior to visit.    Allergies  Allergen Reactions  . Lipitor [Atorvastatin]     Itching   . Sulfa Antibiotics Rash    Past Medical History  Diagnosis Date  . Urinary incontinence   . Depression   . GERD (gastroesophageal reflux disease)   . Diverticulitis   . Hyperlipidemia   . Internal hemorrhoids   . Belching   . Constipation   . Arthritis   . Anxiety   . IBS (irritable bowel syndrome)   . Memory deficits   . Bowel dysfunction     BLOCKAGE  . Pneumonia 11/18/12  . Fibromyalgia   . Bladder disorder     OVERACTIVE    Past Surgical History  Procedure Laterality Date  . Cholecystectomy    . Tonsillectomy  1947  . Bladder  suspension  2004, 2012  . Cataract extraction w/phaco Right 08/27/2015    Procedure: CATARACT EXTRACTION PHACO AND INTRAOCULAR LENS PLACEMENT ([facility]);  Surgeon: Estill Cotta, MD;  Location: [facility] ORS;  Service: Ophthalmology;  Laterality: Right;  [place]   1:00.2 AP%  22.5 CDE  23.67 fluid casette lot #[id number]  exp05/31/2018  . Cataract extraction w/phaco Left 10/15/2015    Procedure: CATARACT EXTRACTION PHACO AND INTRAOCULAR LENS PLACEMENT ([facility]);  Surgeon: Estill Cotta, MD;  Location: [facility] ORS;  Service: Ophthalmology;  Laterality: Left;  [place] 01:07 AP% 18.1 CDE 21.57 fluid pack lot # [id number]    Family History  Problem Relation Age of Onset  . Cancer Father   . Diabetes Father   . Heart disease Father   . Heart disease Mother   . Breast cancer Sister     Social History   Social History  . Marital Status: Married    Spouse Name:  N/A  . Number of Children: N/A  . Years of Education: N/A   Occupational History  . Retired    Social History Main Topics  . Smoking status: Never Smoker   . Smokeless tobacco: Never Used  . Alcohol Use: Yes     Comment: OCCAS  . Drug Use: No  . Sexual Activity: Not Currently   Other Topics Concern  . Not on file   Social History Narrative   Recently moved with her husband to [place] from [place].   Husband is a retired Pharmacist, community.   No children.   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   The PMH, PSH, Social History, Family History, Medications, and allergies have been reviewed in [facility], and have been updated if relevant.   Review of Systems  Constitutional: Negative.   HENT: Negative for congestion, sinus pressure, sneezing and sore throat.   Eyes: Negative.   Respiratory: Positive for cough. Negative for shortness of breath, wheezing and stridor.   Cardiovascular: Negative.   Gastrointestinal: Negative for nausea, abdominal pain, diarrhea, constipation and abdominal distention.       +GERD symptoms  Musculoskeletal: Negative.   All other systems reviewed and are negative.      Objective:    BP 112/62 mmHg  Pulse 41  Temp(Src) 97.6 F (36.4 C) (Oral)  Wt 168 lb 8 oz (76.431 kg)  SpO2 96%   Physical Exam  Constitutional: She is oriented to person, place, and time. She appears well-developed and well-nourished. No distress.  HENT:  Head: Normocephalic.  Eyes: Conjunctivae are normal.  Cardiovascular: Normal rate and regular rhythm.   Pulmonary/Chest: Effort normal and breath sounds normal. No respiratory distress. She has no wheezes.  Neurological: She is alert and oriented to person, place, and time. No cranial nerve deficit.  Skin: Skin is warm and dry. She is not diaphoretic.  Psychiatric: She has a normal mood and affect. Her behavior is normal. Judgment and thought content normal.  Nursing note and vitals reviewed.         Assessment &  Plan:   Acute upper respiratory infection  Gastroesophageal reflux disease, esophagitis presence not specified No Follow-up on file.

## 2015-10-26 ENCOUNTER — Encounter: Payer: Self-pay | Admitting: Internal Medicine

## 2015-10-26 ENCOUNTER — Ambulatory Visit (INDEPENDENT_AMBULATORY_CARE_PROVIDER_SITE_OTHER): Payer: Medicare Other | Admitting: Internal Medicine

## 2015-10-26 VITALS — BP 106/60 | HR 78 | Ht 64.0 in | Wt 169.0 lb

## 2015-10-26 DIAGNOSIS — R05 Cough: Secondary | ICD-10-CM | POA: Diagnosis not present

## 2015-10-26 DIAGNOSIS — K219 Gastro-esophageal reflux disease without esophagitis: Secondary | ICD-10-CM | POA: Diagnosis not present

## 2015-10-26 DIAGNOSIS — R059 Cough, unspecified: Secondary | ICD-10-CM

## 2015-10-26 DIAGNOSIS — K5909 Other constipation: Secondary | ICD-10-CM

## 2015-10-26 DIAGNOSIS — K5900 Constipation, unspecified: Secondary | ICD-10-CM | POA: Diagnosis not present

## 2015-10-26 DIAGNOSIS — Z1211 Encounter for screening for malignant neoplasm of colon: Secondary | ICD-10-CM | POA: Diagnosis not present

## 2015-10-26 MED ORDER — BENZONATATE 100 MG PO CAPS: ORAL_CAPSULE | ORAL | Status: DC

## 2015-10-26 MED ORDER — RANITIDINE HCL 150 MG PO TABS: 150.0000 mg | ORAL_TABLET | Freq: Two times a day (BID) | ORAL | Status: DC

## 2015-10-26 NOTE — Progress Notes (Signed)
Subjective:    Patient ID: Jamie Matthews, female    DOB: 05-Jul-1940, 75 y.o.   MRN: [id number]  HPI Jamie Matthews is a 75 year old female with a history of IBS with constipation, GERD, diverticulosis with remote diverticulitis who is here for follow-up. She was last seen in January 2017. At that time she was doing well from a constipation and GERD perspective. Most recently she has noticed a few months of worsening reflux. She is having regurgitation of sour brash. No true heartburn. This is worse after eating. No nausea or vomiting. No dysphagia or odynophagia. She has noticed coughing particularly when lying down. She was treated with bronchitis within the last several months with a Z-Pak. She continued on Dexilant 60 mg daily but stopped her ranitidine that she was previously taking in the evening. 2 weeks ago she resumed ranitidine in the evening. She has noticed some slight improvement since this time. She denies fever or chills. Denies chest pain and dyspnea. Her constipation perspective she continues Amitiza 24 g twice daily and MiraLAX 17 g daily. Constipation has been well controlled with this regimen. Previously Linzess was stopped.  Review of Systems As per history of present illness, otherwise negative  Current Medications, Allergies, Past Medical History, Past Surgical History, Family History and Social History were reviewed in [employer] record.     Objective:   Physical Exam BP 106/60 mmHg  Pulse 78  Ht '5\' 4"'$  (1.626 m)  Wt 169 lb (76.658 kg)  BMI 28.99 kg/m2 Constitutional: Well-developed and well-nourished. No distress. HEENT: Normocephalic and atraumatic. Oropharynx is clear and moist. No oropharyngeal exudate. Conjunctivae are normal.  No scleral icterus. Neck: Neck supple. Trachea midline. Cardiovascular: Normal rate, regular rhythm and intact distal pulses. No M/R/G Pulmonary/chest: Effort normal and breath sounds normal. No  wheezing, rales or rhonchi. Abdominal: Soft, nontender, nondistended. Bowel sounds active throughout. There are no masses palpable. No hepatosplenomegaly. Extremities: no clubbing, cyanosis, or edema Lymphadenopathy: No cervical adenopathy noted. Neurological: Alert and oriented to person place and time. Skin: Skin is warm and dry. No rashes noted. Psychiatric: Normal mood and affect. Behavior is normal.  CBC    Component Value Date/Time   WBC 7.4 07/18/2015 1006   WBC 9.9 06/12/2014 0518   RBC 3.77* 07/18/2015 1006   RBC 3.95 06/12/2014 0518   HGB 12.1 07/18/2015 1006   HGB 12.6 06/12/2014 0518   HCT 36.2 07/18/2015 1006   HCT 37.9 06/12/2014 0518   PLT 263.0 07/18/2015 1006   PLT 204 06/12/2014 0518   MCV 95.8 07/18/2015 1006   MCV 96 06/12/2014 0518   MCH 32.1 07/12/2015 1437   MCH 31.9 06/12/2014 0518   MCHC 33.6 07/18/2015 1006   MCHC 33.2 06/12/2014 0518   RDW 13.6 07/18/2015 1006   RDW 13.0 06/12/2014 0518   LYMPHSABS 1.2 07/18/2015 1006   LYMPHSABS 0.6* 06/12/2014 0518   MONOABS 0.8 07/18/2015 1006   MONOABS 1.0* 06/12/2014 0518   EOSABS 0.3 07/18/2015 1006   EOSABS 0.1 06/12/2014 0518   BASOSABS 0.0 07/18/2015 1006   BASOSABS 0.0 06/12/2014 0518    CMP     Component Value Date/Time   NA 138 07/18/2015 1006   NA 139 06/12/2014 0518   K 4.0 07/18/2015 1006   K 4.4 06/12/2014 0518   CL 102 07/18/2015 1006   CL 104 06/12/2014 0518   CO2 31 07/18/2015 1006   CO2 26 06/12/2014 0518   GLUCOSE 137* 07/18/2015 1006   GLUCOSE  93 06/12/2014 0518   BUN 21 07/18/2015 1006   BUN 17 06/12/2014 0518   CREATININE 0.88 07/18/2015 1006   CREATININE 0.80 06/12/2014 0518   CALCIUM 8.9 07/18/2015 1006   CALCIUM 8.2* 06/12/2014 0518   PROT 6.5 07/18/2015 1006   PROT 7.6 06/10/2014 1649   ALBUMIN 4.0 07/18/2015 1006   ALBUMIN 4.1 06/10/2014 1649   AST 18 07/18/2015 1006   AST 32 06/10/2014 1649   ALT 17 07/18/2015 1006   ALT 37 06/10/2014 1649   ALKPHOS 69  07/18/2015 1006   ALKPHOS 92 06/10/2014 1649   BILITOT 0.4 07/18/2015 1006   BILITOT 0.6 06/10/2014 1649   GFRNONAA 57* 07/12/2015 1437   GFRNONAA >60 06/12/2014 0518   GFRNONAA >60 04/30/2013 2241   GFRAA >60 07/12/2015 1437   GFRAA >60 06/12/2014 0518   GFRAA >60 04/30/2013 2241        Assessment & Plan:  75 year old female with a history of IBS with constipation, GERD, diverticulosis with remote diverticulitis who is here for follow-up.   1. GERD -- She has had breakthrough GERD symptoms despite excellent 60 mg daily. Previous upper endoscopy was performed in July of 2014. This revealed fundic gland polyps but the exam is otherwise normal. Dilation was performed with 52 [place] at that time. She has not currently had any new alarm symptoms other than her breakthrough reflux. Perhaps this is secondary to discontinuation of ranitidine at night. To try to get her reflux in control we discussed the GERD diet and she was given a copy of such. She will continue Dexilant 60 mg daily. Begin Zantac 150 mg each morning and each evening. If symptoms fail to respond to this therapy, I recommended consideration of repeat endoscopy.  2. Cough -- likely secondary to an element of LPR. I'm going to give her Tessalon Perles 100 200 mg 3 times daily as needed to help with cough. GERD treatment as per #1  3. Colorectal cancer screening -- she had a colonoscopy which was performed 5 years ago in [place]. This was a very difficult colonoscopy due to tortuosity and retained stool. We had considered repeat colonoscopy at the five-year interval given poor preparation. We discussed this today and after our discussion the decision was made to order Cologuard. She does not have history of prior colon polyps. She understands of Cologuard as positive she would need colonoscopy. Should this be the case, she would be willing to undergo colonoscopy.  4. Chronic constipation -- continue [place] is a 24 g twice a day  and MiraLAX 17 g daily  3-4 month follow-up, sooner if necessary 25 minutes spent with the patient today. Greater than 50% was spent in counseling and coordination of care with the patient

## 2015-10-26 NOTE — Patient Instructions (Addendum)
We have sent the following medications to your pharmacy for you to pick up at your convenience: Dexilant 60 mg daily Zantac 150 mg twice daily (increased from previous dose) Tessalon Perles- 1-2 tablets by mouth three times daily as needed for cough  Continue Amitiza and Miralax.  We have sent your demographic and insurance information to [employer]. They should contact you within the next week regarding your Cologuard (colon cancer screening) test. If you have not heard from them within the next week, please call our office at [phone].  Please follow up with Dr Hilarie Fredrickson on Friday, 01/25/16 @ 1:45 pm.  We have given you a GERD diet to follow.  If you are age 65 or older, your body mass index should be between 23-30. Your Body mass index is 28.99 kg/(m^2). If this is out of the aforementioned range listed, please consider follow up with your Primary Care Provider.  If you are age 90 or younger, your body mass index should be between 19-25. Your Body mass index is 28.99 kg/(m^2). If this is out of the aformentioned range listed, please consider follow up with your Primary Care Provider.

## 2015-11-08 DIAGNOSIS — L821 Other seborrheic keratosis: Secondary | ICD-10-CM | POA: Diagnosis not present

## 2015-11-08 DIAGNOSIS — B078 Other viral warts: Secondary | ICD-10-CM | POA: Diagnosis not present

## 2015-11-08 DIAGNOSIS — L219 Seborrheic dermatitis, unspecified: Secondary | ICD-10-CM | POA: Diagnosis not present

## 2015-11-08 DIAGNOSIS — L720 Epidermal cyst: Secondary | ICD-10-CM | POA: Diagnosis not present

## 2015-11-08 DIAGNOSIS — L858 Other specified epidermal thickening: Secondary | ICD-10-CM | POA: Diagnosis not present

## 2015-11-08 DIAGNOSIS — L820 Inflamed seborrheic keratosis: Secondary | ICD-10-CM | POA: Diagnosis not present

## 2015-11-09 NOTE — Telephone Encounter (Signed)
Rx never picked up; shred 11/09/15

## 2015-12-05 DIAGNOSIS — Z1211 Encounter for screening for malignant neoplasm of colon: Secondary | ICD-10-CM | POA: Diagnosis not present

## 2015-12-05 DIAGNOSIS — Z1212 Encounter for screening for malignant neoplasm of rectum: Secondary | ICD-10-CM | POA: Diagnosis not present

## 2015-12-14 ENCOUNTER — Other Ambulatory Visit: Payer: Self-pay

## 2015-12-14 LAB — COLOGUARD: Cologuard: POSITIVE

## 2015-12-21 ENCOUNTER — Telehealth: Payer: Self-pay | Admitting: Internal Medicine

## 2015-12-25 ENCOUNTER — Telehealth: Payer: Self-pay

## 2015-12-25 ENCOUNTER — Other Ambulatory Visit: Payer: Self-pay | Admitting: Internal Medicine

## 2015-12-25 DIAGNOSIS — N6310 Unspecified lump in the right breast, unspecified quadrant: Secondary | ICD-10-CM

## 2015-12-25 DIAGNOSIS — Z1239 Encounter for other screening for malignant neoplasm of breast: Secondary | ICD-10-CM

## 2015-12-25 NOTE — Telephone Encounter (Signed)
Pt left v/m requesting referral to get diagnostic mammogram. Per 10/10/15 diag breast tomo uni rt; recommendations were bilateral diagnostic mammo10/2017.Please advise.

## 2015-12-25 NOTE — Telephone Encounter (Signed)
I will place order but it appears she is not yet due.

## 2015-12-25 NOTE — Telephone Encounter (Signed)
Lm on pts vm and advised per Dr Deborra Medina.

## 2015-12-25 NOTE — Telephone Encounter (Signed)
What would you like for me to relay to her? She is the one making the order request, she is only needing it ordered to have completed

## 2015-12-25 NOTE — Telephone Encounter (Signed)
Please call pt to give her the below information.

## 2015-12-26 ENCOUNTER — Telehealth: Payer: Self-pay | Admitting: Family Medicine

## 2015-12-26 DIAGNOSIS — N6310 Unspecified lump in the right breast, unspecified quadrant: Secondary | ICD-10-CM

## 2015-12-26 NOTE — Telephone Encounter (Signed)
Spoke with Jamie Matthews they need diag uni right mammogram not diagnostic thanks

## 2015-12-26 NOTE — Telephone Encounter (Signed)
Pt called back she had some ?'s

## 2015-12-26 NOTE — Telephone Encounter (Signed)
Orders entered

## 2015-12-26 NOTE — Telephone Encounter (Signed)
I dont' see orders in epic.  I know I had you add ultras for another pt

## 2015-12-26 NOTE — Telephone Encounter (Signed)
Jamie Matthews needs uni left and right ultra sound before making diagnostic mammogram

## 2015-12-26 NOTE — Telephone Encounter (Signed)
I ordered them yesterday.

## 2015-12-26 NOTE — Telephone Encounter (Signed)
Spoke to pt who states she received message but wanted to confirm verbally that order has been placed. Pts chart indicates orders placed today 8/2

## 2015-12-26 NOTE — Telephone Encounter (Signed)
0

## 2015-12-27 ENCOUNTER — Other Ambulatory Visit: Payer: Self-pay | Admitting: Family Medicine

## 2015-12-27 DIAGNOSIS — N6320 Unspecified lump in the left breast, unspecified quadrant: Secondary | ICD-10-CM

## 2015-12-27 NOTE — Telephone Encounter (Signed)
Spoke with pt and answered her questions. See result note.

## 2016-01-01 ENCOUNTER — Telehealth: Payer: Self-pay | Admitting: Internal Medicine

## 2016-01-01 MED ORDER — DEXLANSOPRAZOLE 60 MG PO CPDR: 1.0000 | DELAYED_RELEASE_CAPSULE | Freq: Every day | ORAL | 3 refills | Status: DC

## 2016-01-01 NOTE — Telephone Encounter (Signed)
Patient advised that rx for Dexilant has been sent to pharmacy. At the time of the denial (12-25-15), we were awaiting a return phone call from her regarding + cologuard. She has since returned our call and scheduled needed procedures.

## 2016-01-08 ENCOUNTER — Other Ambulatory Visit: Payer: Self-pay | Admitting: Internal Medicine

## 2016-01-10 DIAGNOSIS — L821 Other seborrheic keratosis: Secondary | ICD-10-CM | POA: Diagnosis not present

## 2016-01-10 DIAGNOSIS — L720 Epidermal cyst: Secondary | ICD-10-CM | POA: Diagnosis not present

## 2016-01-10 DIAGNOSIS — L858 Other specified epidermal thickening: Secondary | ICD-10-CM | POA: Diagnosis not present

## 2016-01-10 DIAGNOSIS — L219 Seborrheic dermatitis, unspecified: Secondary | ICD-10-CM | POA: Diagnosis not present

## 2016-01-14 ENCOUNTER — Other Ambulatory Visit: Payer: Self-pay | Admitting: Family Medicine

## 2016-01-14 ENCOUNTER — Ambulatory Visit
Admission: RE | Admit: 2016-01-14 | Discharge: 2016-01-14 | Disposition: A | Discharge status: Home / Self Care | Payer: Medicare Other | Source: Ambulatory Visit | Attending: Family Medicine | Admitting: Family Medicine

## 2016-01-14 DIAGNOSIS — R928 Other abnormal and inconclusive findings on diagnostic imaging of breast: Secondary | ICD-10-CM | POA: Diagnosis not present

## 2016-01-14 DIAGNOSIS — N6320 Unspecified lump in the left breast, unspecified quadrant: Secondary | ICD-10-CM

## 2016-01-14 DIAGNOSIS — N6310 Unspecified lump in the right breast, unspecified quadrant: Secondary | ICD-10-CM

## 2016-01-14 DIAGNOSIS — N6489 Other specified disorders of breast: Secondary | ICD-10-CM | POA: Diagnosis not present

## 2016-01-14 DIAGNOSIS — N63 Unspecified lump in breast: Secondary | ICD-10-CM | POA: Diagnosis present

## 2016-01-16 ENCOUNTER — Other Ambulatory Visit: Payer: Self-pay | Admitting: Internal Medicine

## 2016-01-17 ENCOUNTER — Ambulatory Visit (AMBULATORY_SURGERY_CENTER): Payer: Self-pay | Admitting: *Deleted

## 2016-01-17 VITALS — Ht 64.5 in | Wt 165.4 lb

## 2016-01-17 DIAGNOSIS — R195 Other fecal abnormalities: Secondary | ICD-10-CM

## 2016-01-17 MED ORDER — NA SULFATE-K SULFATE-MG SULF 17.5-3.13-1.6 GM/177ML PO SOLN: 1.0000 | Freq: Once | ORAL | 0 refills | Status: AC

## 2016-01-17 NOTE — Progress Notes (Signed)
Denies allergies to eggs or soy products. Denies complications with sedation or anesthesia. Denies O2 use. Denies use of diet or weight loss medications.  Emmi instructions given for colonoscopy.  

## 2016-01-25 ENCOUNTER — Ambulatory Visit: Payer: Self-pay | Admitting: Internal Medicine

## 2016-01-31 ENCOUNTER — Ambulatory Visit (INDEPENDENT_AMBULATORY_CARE_PROVIDER_SITE_OTHER)
Admission: RE | Admit: 2016-01-31 | Discharge: 2016-01-31 | Disposition: A | Discharge status: Home / Self Care | Payer: Medicare Other | Source: Ambulatory Visit | Attending: Family Medicine | Admitting: Family Medicine

## 2016-01-31 ENCOUNTER — Encounter: Payer: Self-pay | Admitting: Family Medicine

## 2016-01-31 ENCOUNTER — Ambulatory Visit (INDEPENDENT_AMBULATORY_CARE_PROVIDER_SITE_OTHER): Payer: Medicare Other | Admitting: Family Medicine

## 2016-01-31 ENCOUNTER — Encounter: Payer: Self-pay | Admitting: Internal Medicine

## 2016-01-31 VITALS — BP 122/62 | HR 65 | Temp 97.9°F | Wt 169.5 lb

## 2016-01-31 DIAGNOSIS — M545 Low back pain, unspecified: Secondary | ICD-10-CM

## 2016-01-31 DIAGNOSIS — S3992XA Unspecified injury of lower back, initial encounter: Secondary | ICD-10-CM | POA: Diagnosis not present

## 2016-01-31 MED ORDER — TRAMADOL HCL 50 MG PO TABS: 50.0000 mg | ORAL_TABLET | Freq: Three times a day (TID) | ORAL | 0 refills | Status: DC | PRN

## 2016-01-31 NOTE — Progress Notes (Signed)
SUBJECTIVE:  Jamie Matthews is a 75 y.o. female who complains of an injury causing low back pain 1  week(s) ago. The pain is positional with bending or lifting, without radiation down the legs. Mechanism of injury: tripped over a box, fell backward and landed on her lower back. Symptoms have been gradual since that time. Prior history of back problems: recurrent self limited episodes of low back pain in the past. There is no numbness in the legs.  Current Outpatient Prescriptions on File Prior to Visit  Medication Sig Dispense Refill  . AMITIZA 24 MCG capsule TAKE ONE CAPSULE TWICE A DAY WITH A MEAL 60 capsule 0  . atorvastatin (LIPITOR) 20 MG tablet TAKE 1 TABLET BY MOUTH ONE TIME DAILY 90 tablet 1  . benzonatate (TESSALON PERLES) 100 MG capsule Take 1-2 tablets by mouth three times daily as needed for cough 60 capsule 0  . BIOTIN 5000 PO Take by mouth.    [name] buPROPion (WELLBUTRIN XL) 150 MG 24 hr tablet TAKE 1 TABLET EVERY DAY 30 tablet 5  . Calcium Carbonate Antacid (ANTACID PO) Take by mouth as needed.    [name] dexlansoprazole (DEXILANT) 60 MG capsule Take 1 capsule (60 mg total) by mouth daily. 30 capsule 3  . diphenhydrAMINE (BENADRYL) 25 MG tablet Take 25 mg by mouth daily.    . Fluocinolone Acetonide (CAPEX) 0.01 % SHAM     . fluocinonide cream (LIDEX) 0.05 % Reported on 10/15/2015    . MAGNESIUM PO Take by mouth.    . memantine (NAMENDA) 10 MG tablet TAKE ONE TABLET TWICE A DAY 60 tablet 5  . Multiple Vitamins-Minerals (CENTRUM PO) Take one by mouth daily    . MYRBETRIQ 50 MG TB24 tablet TAKE 1 TABLET EVERY DAY 30 tablet 5  . polyethylene glycol (MIRALAX / GLYCOLAX) packet Take 17 g by mouth daily.    [name] Propylene Glycol (SYSTANE BALANCE OP) Apply to eye as needed.    . ranitidine (ZANTAC) 150 MG tablet Take 1 tablet (150 mg total) by mouth 2 (two) times daily. 60 tablet 3  . Sodium Chloride-Sodium Bicarb (AYR SALINE NASAL RINSE NA) Place into the nose.    . venlafaxine XR  (EFFEXOR-XR) 150 MG 24 hr capsule TAKE ONE CAPSULE DAILY 30 capsule 5   No current facility-administered medications on file prior to visit.     Allergies  Allergen Reactions  . Sulfa Antibiotics Rash    Past Medical History:  Diagnosis Date  . Anxiety   . Arthritis   . Belching   . Bladder disorder    OVERACTIVE  . Bowel dysfunction    BLOCKAGE  . Constipation   . Depression   . Diverticulitis   . Fibromyalgia   . GERD (gastroesophageal reflux disease)   . Hyperlipidemia   . IBS (irritable bowel syndrome)   . Internal hemorrhoids   . Memory deficits   . Pneumonia 11/18/12  . Urinary incontinence     Past Surgical History:  Procedure Laterality Date  . BLADDER SUSPENSION  2004, 2012  . CATARACT EXTRACTION W/PHACO Right 08/27/2015   Procedure: CATARACT EXTRACTION PHACO AND INTRAOCULAR LENS PLACEMENT (IOC);  Surgeon: Estill Cotta, MD;  Location: [facility] ORS;  Service: Ophthalmology;  Laterality: Right;  [place]   1:00.2 AP%  22.5 CDE  23.67 fluid casette lot #[id number]  exp05/31/2018  . CATARACT EXTRACTION W/PHACO Left 10/15/2015   Procedure: CATARACT EXTRACTION PHACO AND INTRAOCULAR LENS PLACEMENT (IOC);  Surgeon: Estill Cotta, MD;  Location: [facility] ORS;  Service: Ophthalmology;  Laterality: Left;  [place] 01:07 AP% 18.1 CDE 21.57 fluid pack lot # [id number]  . CHOLECYSTECTOMY    . TONSILLECTOMY  1947    Family History  Problem Relation Age of Onset  . Cancer Father   . Diabetes Father   . Heart disease Father   . Heart disease Mother   . Breast cancer Sister 58  . Colon cancer Neg Hx   . Esophageal cancer Neg Hx   . Rectal cancer Neg Hx   . Stomach cancer Neg Hx     Social History   Social History  . Marital status: Married    Spouse name: N/A  . Number of children: N/A  . Years of education: N/A   Occupational History  . Retired    Social History Main Topics  . Smoking status: Never Smoker  . Smokeless tobacco: Never Used  . Alcohol use Yes      Comment: OCCAS  . Drug use: No  . Sexual activity: Not Currently   Other Topics Concern  . Not on file   Social History Narrative   Recently moved with her husband to [place] from [place].   Husband is a retired Pharmacist, community.   No children.   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   The PMH, PSH, Social History, Family History, Medications, and allergies have been reviewed in [facility], and have been updated if relevant.  OBJECTIVE: BP 122/62   Pulse 65   Temp 97.9 F (36.6 C) (Oral)   Wt 169 lb 8 oz (76.9 kg)   SpO2 95%   BMI 28.65 kg/m   Vital signs as noted above. Patient appears to be in mild to moderate pain, antalgic gait noted. Lumbosacral spine area reveals local tenderness or mass. Painful and reduced LS ROM noted.. DTR's, motor strength and sensation normal, including heel and toe gait.  Peripheral pulses are palpable. Lumbar spine X-Ray: ordered, but results not yet available.   ASSESSMENT:  Concern for vertebral fracture  PLAN: Lumbar xray Tramadol rx printed and given to pt for severe pain. For acute pain, rest, intermittent application of cold packs (later, may switch to heat, but do not sleep on heating pad), analgesics and muscle relaxants are recommended. Proper lifting with avoidance of heavy lifting discussed.[name]

## 2016-01-31 NOTE — Progress Notes (Signed)
Pre visit review using our clinic review tool, if applicable. No additional management support is needed unless otherwise documented below in the visit note. 

## 2016-02-01 ENCOUNTER — Telehealth: Payer: Self-pay | Admitting: Family Medicine

## 2016-02-01 NOTE — Telephone Encounter (Signed)
Patient called to get the results of her test yesterday.  I let her know Dr.Aron's comments and she voiced understanding.

## 2016-02-04 ENCOUNTER — Ambulatory Visit: Payer: Self-pay | Admitting: Family Medicine

## 2016-02-12 DIAGNOSIS — L578 Other skin changes due to chronic exposure to nonionizing radiation: Secondary | ICD-10-CM | POA: Diagnosis not present

## 2016-02-12 DIAGNOSIS — L821 Other seborrheic keratosis: Secondary | ICD-10-CM | POA: Diagnosis not present

## 2016-02-12 DIAGNOSIS — L820 Inflamed seborrheic keratosis: Secondary | ICD-10-CM | POA: Diagnosis not present

## 2016-02-12 DIAGNOSIS — L720 Epidermal cyst: Secondary | ICD-10-CM | POA: Diagnosis not present

## 2016-02-14 ENCOUNTER — Encounter: Payer: Self-pay | Admitting: Internal Medicine

## 2016-02-14 ENCOUNTER — Ambulatory Visit (AMBULATORY_SURGERY_CENTER): Payer: Medicare Other | Admitting: Internal Medicine

## 2016-02-14 VITALS — BP 117/55 | HR 59 | Temp 97.8°F | Resp 13 | Ht 64.5 in | Wt 165.0 lb

## 2016-02-14 DIAGNOSIS — F341 Dysthymic disorder: Secondary | ICD-10-CM | POA: Diagnosis not present

## 2016-02-14 DIAGNOSIS — D123 Benign neoplasm of transverse colon: Secondary | ICD-10-CM

## 2016-02-14 DIAGNOSIS — K219 Gastro-esophageal reflux disease without esophagitis: Secondary | ICD-10-CM | POA: Diagnosis not present

## 2016-02-14 DIAGNOSIS — R195 Other fecal abnormalities: Secondary | ICD-10-CM | POA: Diagnosis not present

## 2016-02-14 DIAGNOSIS — D122 Benign neoplasm of ascending colon: Secondary | ICD-10-CM | POA: Diagnosis not present

## 2016-02-14 MED ORDER — SODIUM CHLORIDE 0.9 % IV SOLN: 500.0000 mL | INTRAVENOUS | Status: DC

## 2016-02-14 NOTE — Op Note (Signed)
[place] Patient Name: Jamie Matthews Procedure Date: 02/14/2016 10:53 AM MRN: [id number] Endoscopist: Jerene Bears , MD Age: 75 Referring MD:  Date of Birth: 10/06/1940 Gender: Female Account #: [id number] Procedure:                Colonoscopy Indications:              Positive Cologuard test Medicines:                Monitored Anesthesia Care Procedure:                Pre-Anesthesia Assessment:                           - Prior to the procedure, a History and Physical                            was performed, and patient medications and                            allergies were reviewed. The patient's tolerance of                            previous anesthesia was also reviewed. The risks                            and benefits of the procedure and the sedation                            options and risks were discussed with the patient.                            All questions were answered, and informed consent                            was obtained. Prior Anticoagulants: The patient has                            taken no previous anticoagulant or antiplatelet                            agents. ASA Grade Assessment: III - A patient with                            severe systemic disease. After reviewing the risks                            and benefits, the patient was deemed in                            satisfactory condition to undergo the procedure.                           After obtaining informed consent, the colonoscope  was passed under direct vision. Throughout the                            procedure, the patient's blood pressure, pulse, and                            oxygen saturations were monitored continuously. The                            Model PCF-H190L [id number]) scope was introduced                            through the anus and advanced to the the cecum,                            identified by appendiceal  orifice and ileocecal                            valve. The colonoscopy was somewhat difficult due                            to a tortuous colon and retained liquid. Successful                            completion of the procedure was aided by applying                            abdominal pressure and lavage. The patient                            tolerated the procedure well. The quality of the                            bowel preparation was good. The ileocecal valve,                            appendiceal orifice, and rectum were photographed. Scope In: 10:57:19 AM Scope Out: 11:26:57 AM Scope Withdrawal Time: 0 hours 19 minutes 11 seconds  Total Procedure Duration: 0 hours 29 minutes 38 seconds  Findings:                 The digital rectal exam was normal.                           A 5 mm polyp was found in the proximal ascending                            colon. The polyp was sessile. The polyp was removed                            with a cold snare. Resection and retrieval were  complete.                           A 12 mm polyp was found in the hepatic flexure. The                            polyp was sessile. The polyp was removed with a                            cold snare. Resection and retrieval were complete.                           External hemorrhoids were found during                            retroflexion. The hemorrhoids were small.                           The exam was otherwise without abnormality. Complications:            No immediate complications. Estimated Blood Loss:     Estimated blood loss: none. Impression:               - One 5 mm polyp in the proximal ascending colon,                            removed with a cold snare. Resected and retrieved.                           - One 12 mm polyp at the hepatic flexure, removed                            with a cold snare. Resected and retrieved. Recommendation:           - Patient  has a contact number available for                            emergencies. The signs and symptoms of potential                            delayed complications were discussed with the                            patient. Return to normal activities tomorrow.                            Written discharge instructions were provided to the                            patient.                           - Resume previous diet.                           - Continue present  medications.                           - Await pathology results.                           - Repeat colonoscopy to be considered based on                            pathology results and age at the time of next                            examination. Jerene Bears, MD 02/14/2016 11:32:02 AM This report has been signed electronically.

## 2016-02-14 NOTE — Progress Notes (Signed)
Report to PACU, RN, vss, BBS= Clear.  

## 2016-02-14 NOTE — Progress Notes (Signed)
Called to room to assist during endoscopic procedure.  Patient ID and intended procedure confirmed with present staff. Received instructions for my participation in the procedure from the performing physician.  

## 2016-02-14 NOTE — Patient Instructions (Addendum)
Colon polyps removed today, handouts given on polyps, hemorrhoids.  Result letter in your mail in 2-3 weeks. Resume current medications. Call us with any questions or concerns. Thank you!!   YOU HAD AN ENDOSCOPIC PROCEDURE TODAY AT [facility]:   Refer to the procedure report that was given to you for any specific questions about what was found during the examination.  If the procedure report does not answer your questions, please call your gastroenterologist to clarify.  If you requested that your care partner not be given the details of your procedure findings, then the procedure report has been included in a sealed envelope for you to review at your convenience later.  YOU SHOULD EXPECT: Some feelings of bloating in the abdomen. Passage of more gas than usual.  Walking can help get rid of the air that was put into your GI tract during the procedure and reduce the bloating. If you had a lower endoscopy (such as a colonoscopy or flexible sigmoidoscopy) you may notice spotting of blood in your stool or on the toilet paper. If you underwent a bowel prep for your procedure, you may not have a normal bowel movement for a few days.  Please Note:  You might notice some irritation and congestion in your nose or some drainage.  This is from the oxygen used during your procedure.  There is no need for concern and it should clear up in a day or so.  SYMPTOMS TO REPORT IMMEDIATELY:   Following lower endoscopy (colonoscopy or flexible sigmoidoscopy):  Excessive amounts of blood in the stool  Significant tenderness or worsening of abdominal pains  Swelling of the abdomen that is new, acute  Fever of 100F or higher   For urgent or emergent issues, a gastroenterologist can be reached at any hour by calling [phone].   DIET:  We do recommend a small meal at first, but then you may proceed to your regular diet.  Drink plenty of fluids but you should avoid alcoholic beverages for 24  hours.  ACTIVITY:  You should plan to take it easy for the rest of today and you should NOT DRIVE or use heavy machinery until tomorrow (because of the sedation medicines used during the test).    FOLLOW UP: Our staff will call the number listed on your records the next business day following your procedure to check on you and address any questions or concerns that you may have regarding the information given to you following your procedure. If we do not reach you, we will leave a message.  However, if you are feeling well and you are not experiencing any problems, there is no need to return our call.  We will assume that you have returned to your regular daily activities without incident.  If any biopsies were taken you will be contacted by phone or by letter within the next 1-3 weeks.  Please call us at [phone] if you have not heard about the biopsies in 3 weeks.    SIGNATURES/CONFIDENTIALITY: You and/or your care partner have signed paperwork which will be entered into your electronic medical record.  These signatures attest to the fact that that the information above on your After Visit Summary has been reviewed and is understood.  Full responsibility of the confidentiality of this discharge information lies with you and/or your care-partner.

## 2016-02-15 ENCOUNTER — Telehealth: Payer: Self-pay

## 2016-02-15 NOTE — Telephone Encounter (Signed)
I was calling a incorrect number.  The correct number was [phone].  I spoke with the pt and she had no problems.  maw

## 2016-02-19 ENCOUNTER — Encounter: Payer: Self-pay | Admitting: Internal Medicine

## 2016-02-19 DIAGNOSIS — M1712 Unilateral primary osteoarthritis, left knee: Secondary | ICD-10-CM | POA: Diagnosis not present

## 2016-02-23 ENCOUNTER — Other Ambulatory Visit: Payer: Self-pay | Admitting: Family Medicine

## 2016-02-23 ENCOUNTER — Other Ambulatory Visit: Payer: Self-pay | Admitting: Internal Medicine

## 2016-02-26 ENCOUNTER — Telehealth: Payer: Self-pay | Admitting: Internal Medicine

## 2016-02-26 MED ORDER — LINACLOTIDE 145 MCG PO CAPS: 145.0000 ug | ORAL_CAPSULE | Freq: Every day | ORAL | 3 refills | Status: DC

## 2016-02-26 NOTE — Telephone Encounter (Signed)
Pt aware and script sent to pharmacy. 

## 2016-02-26 NOTE — Telephone Encounter (Signed)
Would add Linzess 145 mcg daily to the Amitiza 24 mcg BID Hold MiraLax when started Linzess Let's see if this combination will work for her and help with constipation, gas and bloating Ask her to report back about 7-10 days Call with problems sooner if needed Thanks JMP

## 2016-02-26 NOTE — Telephone Encounter (Signed)
Pt had colon done 02/14/16. States that since procedure she has been passing a lot of gas and constipated. She has been taking amitiza 24 bid and daily miralax. States she used an enema last night and was able to have a BM but it was "thin and ropey." She has been taking gas-x but is has not seemed to help with this gas. Pt states the gas is embarrassing and wants to know what she can do for the gas and constipation. Please advise.

## 2016-03-01 ENCOUNTER — Other Ambulatory Visit: Payer: Self-pay | Admitting: Internal Medicine

## 2016-03-17 ENCOUNTER — Encounter: Payer: Self-pay | Admitting: Family Medicine

## 2016-03-17 ENCOUNTER — Ambulatory Visit (INDEPENDENT_AMBULATORY_CARE_PROVIDER_SITE_OTHER): Payer: Medicare Other | Admitting: Family Medicine

## 2016-03-17 DIAGNOSIS — R5383 Other fatigue: Secondary | ICD-10-CM | POA: Diagnosis not present

## 2016-03-17 DIAGNOSIS — M1710 Unilateral primary osteoarthritis, unspecified knee: Secondary | ICD-10-CM | POA: Insufficient documentation

## 2016-03-17 DIAGNOSIS — M179 Osteoarthritis of knee, unspecified: Secondary | ICD-10-CM | POA: Insufficient documentation

## 2016-03-17 DIAGNOSIS — M1712 Unilateral primary osteoarthritis, left knee: Secondary | ICD-10-CM | POA: Diagnosis not present

## 2016-03-17 LAB — CBC WITH DIFFERENTIAL/PLATELET
Basophils Absolute: 0 10*3/uL (ref 0.0–0.1)
Basophils Relative: 0.4 % (ref 0.0–3.0)
EOS ABS: 0.2 10*3/uL (ref 0.0–0.7)
EOS PCT: 3.6 % (ref 0.0–5.0)
HEMATOCRIT: 35 % — AB (ref 36.0–46.0)
HEMOGLOBIN: 11.9 g/dL — AB (ref 12.0–15.0)
LYMPHS PCT: 19.5 % (ref 12.0–46.0)
Lymphs Abs: 1.2 10*3/uL (ref 0.7–4.0)
MCHC: 33.8 g/dL (ref 30.0–36.0)
MCV: 94.1 fl (ref 78.0–100.0)
MONO ABS: 0.9 10*3/uL (ref 0.1–1.0)
Monocytes Relative: 14.1 % — ABNORMAL HIGH (ref 3.0–12.0)
Neutro Abs: 4 10*3/uL (ref 1.4–7.7)
Neutrophils Relative %: 62.4 % (ref 43.0–77.0)
Platelets: 253 10*3/uL (ref 150.0–400.0)
RBC: 3.72 Mil/uL — AB (ref 3.87–5.11)
RDW: 14.4 % (ref 11.5–15.5)
WBC: 6.4 10*3/uL (ref 4.0–10.5)

## 2016-03-17 NOTE — Patient Instructions (Signed)
Great to see you. We will call you with your lab results.   You can take up to 3000 mg of Tylenol per day.

## 2016-03-17 NOTE — Progress Notes (Signed)
Subjective:   Patient ID: Jamie Matthews, female    DOB: 1940-08-19, 75 y.o.   MRN: [id number]  Jamie Matthews is a pleasant 75 y.o. year old female who presents to clinic today with Fatigue  on 03/17/2016  HPI:  Fatigue- has been a chronic issue.  OA- seeing Dr. Jefm Bryant.  Receiving cortisone injections and may be considering arthroscopic surgery. Taking Tramadol at bedtime and then feeling very fatigued the following day.  Lab Results  Component Value Date   TSH 3.65 07/18/2015   Lab Results  Component Value Date   [id number] 350 02/15/2015     Depression- chronic issue and currently symptoms are well controlled with her current medications. Trazodone is helping with insomnia.  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  Per pt, could not tolerate other medications.   Current Outpatient Prescriptions on File Prior to Visit  Medication Sig Dispense Refill  . AMITIZA 24 MCG capsule TAKE 1 CAPSULE BY MOUTH TWO TIMES DAILY WITH A MEAL 60 capsule 2  . atorvastatin (LIPITOR) 20 MG tablet TAKE 1 TABLET BY MOUTH ONE TIME DAILY 90 tablet 1  . benzonatate (TESSALON PERLES) 100 MG capsule Take 1-2 tablets by mouth three times daily as needed for cough 60 capsule 0  . BIOTIN 5000 PO Take by mouth.    [name] buPROPion (WELLBUTRIN XL) 150 MG 24 hr tablet TAKE 1 TABLET EVERY DAY 90 tablet 0  . Calcium Carbonate Antacid (ANTACID PO) Take by mouth as needed.    . desonide (DESOWEN) 0.05 % lotion     . dexlansoprazole (DEXILANT) 60 MG capsule Take 1 capsule (60 mg total) by mouth daily. 30 capsule 3  . diphenhydrAMINE (BENADRYL) 25 MG tablet Take 25 mg by mouth daily.    . Fluocinolone Acetonide (CAPEX) 0.01 % SHAM     . fluocinonide cream (LIDEX) 0.05 % Reported on 10/15/2015    . linaclotide (LINZESS) 145 MCG CAPS capsule Take 1 capsule (145 mcg total) by mouth daily before breakfast. 30 capsule 3  . MAGNESIUM PO Take by mouth.    . memantine (NAMENDA) 10 MG tablet TAKE ONE TABLET  TWICE A DAY 60 tablet 5  . Multiple Vitamins-Minerals (CENTRUM PO) Take one by mouth daily    . MYRBETRIQ 50 MG TB24 tablet TAKE 1 TABLET EVERY DAY 30 tablet 5  . polyethylene glycol (MIRALAX / GLYCOLAX) packet Take 17 g by mouth daily.    [name] Propylene Glycol (SYSTANE BALANCE OP) Apply to eye as needed.    . ranitidine (ZANTAC) 150 MG tablet Take 1 tablet (150 mg total) by mouth 2 (two) times daily. 60 tablet 3  . Sodium Chloride-Sodium Bicarb (AYR SALINE NASAL RINSE NA) Place into the nose.    . traMADol (ULTRAM) 50 MG tablet Take 1 tablet (50 mg total) by mouth every 8 (eight) hours as needed. 30 tablet 0  . venlafaxine XR (EFFEXOR-XR) 150 MG 24 hr capsule TAKE ONE CAPSULE DAILY 30 capsule 5   Current Facility-Administered Medications on File Prior to Visit  Medication Dose Route Frequency Provider Last Rate Last Dose  . 0.9 %  sodium chloride infusion  500 mL Intravenous Continuous Jerene Bears, MD        Allergies  Allergen Reactions  . Sulfa Antibiotics Rash    Past Medical History:  Diagnosis Date  . Anxiety   . Arthritis   . Belching   . Bladder disorder    OVERACTIVE  . Bowel dysfunction    BLOCKAGE  .  Constipation   . Depression   . Diverticulitis   . Fibromyalgia   . GERD (gastroesophageal reflux disease)   . Hyperlipidemia   . IBS (irritable bowel syndrome)   . Internal hemorrhoids   . Memory deficits   . Pneumonia 11/18/12  . Urinary incontinence     Past Surgical History:  Procedure Laterality Date  . BLADDER SUSPENSION  2004, 2012  . CATARACT EXTRACTION W/PHACO Right 08/27/2015   Procedure: CATARACT EXTRACTION PHACO AND INTRAOCULAR LENS PLACEMENT (IOC);  Surgeon: Estill Cotta, MD;  Location: [facility] ORS;  Service: Ophthalmology;  Laterality: Right;  [place]   1:00.2 AP%  22.5 CDE  23.67 fluid casette lot #[id number]  exp05/31/2018  . CATARACT EXTRACTION W/PHACO Left 10/15/2015   Procedure: CATARACT EXTRACTION PHACO AND INTRAOCULAR LENS PLACEMENT (IOC);  Surgeon:  Estill Cotta, MD;  Location: [facility] ORS;  Service: Ophthalmology;  Laterality: Left;  [place] 01:07 AP% 18.1 CDE 21.57 fluid pack lot # [id number]  . CHOLECYSTECTOMY    . TONSILLECTOMY  1947    Family History  Problem Relation Age of Onset  . Cancer Father   . Diabetes Father   . Heart disease Father   . Heart disease Mother   . Breast cancer Sister 61  . Colon cancer Neg Hx   . Esophageal cancer Neg Hx   . Rectal cancer Neg Hx   . Stomach cancer Neg Hx     Social History   Social History  . Marital status: Married    Spouse name: N/A  . Number of children: N/A  . Years of education: N/A   Occupational History  . Retired    Social History Main Topics  . Smoking status: Never Smoker  . Smokeless tobacco: Never Used  . Alcohol use Yes     Comment: OCCAS  . Drug use: No  . Sexual activity: Not Currently   Other Topics Concern  . Not on file   Social History Narrative   Recently moved with her husband to [place] from [place].   Husband is a retired Pharmacist, community.   No children.   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   The PMH, PSH, Social History, Family History, Medications, and allergies have been reviewed in [facility], and have been updated if relevant.  Review of Systems  Constitutional: Positive for fatigue.  Musculoskeletal: Positive for arthralgias.  Neurological: Negative.   Psychiatric/Behavioral: Negative.   All other systems reviewed and are negative.      Objective:    BP 116/68   Pulse 65   Temp 97.8 F (36.6 C) (Oral)   Wt 172 lb 4 oz (78.1 kg)   SpO2 96%   BMI 29.11 kg/m    Physical Exam  Constitutional: She is oriented to person, place, and time. She appears well-developed and well-nourished. No distress.  HENT:  Head: Normocephalic.  Eyes: Conjunctivae are normal.  Cardiovascular: Normal rate.   Musculoskeletal: Normal range of motion.  Neurological: She is alert and oriented to person, place, and time. No cranial nerve  deficit.  Skin: Skin is warm and dry. She is not diaphoretic.  Psychiatric: She has a normal mood and affect. Her behavior is normal. Judgment and thought content normal.  Nursing note and vitals reviewed.         Assessment & Plan:   Other fatigue No Follow-up on file.

## 2016-03-17 NOTE — Assessment & Plan Note (Signed)
>  25 minutes spent in face to face time with patient, >50% spent in counselling or coordination of care Likely multifactorial- pain, effects of tramadol. Will get blood work today to rule out some other possible contributing factors. Advised tylenol. The patient indicates understanding of these issues and agrees with the plan. Orders Placed This Encounter  Procedures  . TSH  . Vitamin B12  . CBC with Differential/Platelet  . Vitamin D, 25-hydroxy

## 2016-03-17 NOTE — Progress Notes (Signed)
Pre visit review using our clinic review tool, if applicable. No additional management support is needed unless otherwise documented below in the visit note. 

## 2016-03-18 LAB — VITAMIN B12: Vitamin B-12: 640 pg/mL (ref 211–911)

## 2016-03-18 LAB — VITAMIN D 25 HYDROXY (VIT D DEFICIENCY, FRACTURES): VITD: 28.74 ng/mL — AB (ref 30.00–100.00)

## 2016-03-18 LAB — TSH: TSH: 4.66 u[IU]/mL — AB (ref 0.35–4.50)

## 2016-03-19 DIAGNOSIS — M19049 Primary osteoarthritis, unspecified hand: Secondary | ICD-10-CM | POA: Insufficient documentation

## 2016-03-19 DIAGNOSIS — M1812 Unilateral primary osteoarthritis of first carpometacarpal joint, left hand: Secondary | ICD-10-CM | POA: Diagnosis not present

## 2016-03-19 DIAGNOSIS — M1811 Unilateral primary osteoarthritis of first carpometacarpal joint, right hand: Secondary | ICD-10-CM | POA: Diagnosis not present

## 2016-03-19 DIAGNOSIS — M1712 Unilateral primary osteoarthritis, left knee: Secondary | ICD-10-CM | POA: Diagnosis not present

## 2016-03-20 ENCOUNTER — Other Ambulatory Visit: Payer: Self-pay | Admitting: Unknown Physician Specialty

## 2016-03-20 DIAGNOSIS — M1712 Unilateral primary osteoarthritis, left knee: Secondary | ICD-10-CM

## 2016-03-21 ENCOUNTER — Other Ambulatory Visit: Payer: Self-pay

## 2016-03-21 MED ORDER — VITAMIN D (ERGOCALCIFEROL) 1.25 MG (50000 UNIT) PO CAPS: 50000.0000 [IU] | ORAL_CAPSULE | ORAL | 0 refills | Status: DC

## 2016-03-24 DIAGNOSIS — M1712 Unilateral primary osteoarthritis, left knee: Secondary | ICD-10-CM | POA: Diagnosis not present

## 2016-03-26 ENCOUNTER — Other Ambulatory Visit: Payer: Self-pay | Admitting: Family Medicine

## 2016-03-26 ENCOUNTER — Ambulatory Visit: Payer: Medicare Other

## 2016-03-26 ENCOUNTER — Other Ambulatory Visit: Payer: Self-pay

## 2016-03-27 ENCOUNTER — Other Ambulatory Visit: Payer: Self-pay | Admitting: Family Medicine

## 2016-04-01 ENCOUNTER — Ambulatory Visit
Admission: RE | Admit: 2016-04-01 | Discharge: 2016-04-01 | Disposition: A | Discharge status: Home / Self Care | Payer: Medicare Other | Source: Ambulatory Visit | Attending: Family Medicine | Admitting: Family Medicine

## 2016-04-01 ENCOUNTER — Other Ambulatory Visit: Payer: Self-pay | Admitting: Family Medicine

## 2016-04-01 ENCOUNTER — Ambulatory Visit
Admission: RE | Admit: 2016-04-01 | Discharge: 2016-04-01 | Disposition: A | Discharge status: Home / Self Care | Payer: Medicare Other | Source: Ambulatory Visit | Attending: Unknown Physician Specialty | Admitting: Unknown Physician Specialty

## 2016-04-01 DIAGNOSIS — R921 Mammographic calcification found on diagnostic imaging of breast: Secondary | ICD-10-CM

## 2016-04-01 DIAGNOSIS — M25562 Pain in left knee: Secondary | ICD-10-CM | POA: Diagnosis not present

## 2016-04-01 DIAGNOSIS — R928 Other abnormal and inconclusive findings on diagnostic imaging of breast: Secondary | ICD-10-CM | POA: Diagnosis not present

## 2016-04-01 DIAGNOSIS — N6310 Unspecified lump in the right breast, unspecified quadrant: Secondary | ICD-10-CM | POA: Diagnosis present

## 2016-04-01 DIAGNOSIS — Z1239 Encounter for other screening for malignant neoplasm of breast: Secondary | ICD-10-CM

## 2016-04-01 DIAGNOSIS — M7122 Synovial cyst of popliteal space [Baker], left knee: Secondary | ICD-10-CM | POA: Diagnosis not present

## 2016-04-01 DIAGNOSIS — M1712 Unilateral primary osteoarthritis, left knee: Secondary | ICD-10-CM | POA: Insufficient documentation

## 2016-04-07 ENCOUNTER — Ambulatory Visit (INDEPENDENT_AMBULATORY_CARE_PROVIDER_SITE_OTHER): Payer: Medicare Other | Admitting: Family Medicine

## 2016-04-07 ENCOUNTER — Encounter: Payer: Self-pay | Admitting: Family Medicine

## 2016-04-07 VITALS — BP 132/76 | HR 67 | Temp 98.0°F | Wt 158.2 lb

## 2016-04-07 DIAGNOSIS — R112 Nausea with vomiting, unspecified: Secondary | ICD-10-CM | POA: Diagnosis not present

## 2016-04-07 DIAGNOSIS — R197 Diarrhea, unspecified: Secondary | ICD-10-CM

## 2016-04-07 LAB — CBC WITH DIFFERENTIAL/PLATELET
BASOS PCT: 0.2 % (ref 0.0–3.0)
Basophils Absolute: 0 10*3/uL (ref 0.0–0.1)
Eosinophils Absolute: 0.2 10*3/uL (ref 0.0–0.7)
Eosinophils Relative: 2 % (ref 0.0–5.0)
HEMATOCRIT: 38.9 % (ref 36.0–46.0)
Hemoglobin: 13.2 g/dL (ref 12.0–15.0)
LYMPHS PCT: 12.5 % (ref 12.0–46.0)
Lymphs Abs: 1.4 10*3/uL (ref 0.7–4.0)
MCHC: 34 g/dL (ref 30.0–36.0)
MCV: 94.8 fl (ref 78.0–100.0)
MONOS PCT: 9.3 % (ref 3.0–12.0)
Monocytes Absolute: 1 10*3/uL (ref 0.1–1.0)
NEUTROS ABS: 8.6 10*3/uL — AB (ref 1.4–7.7)
Neutrophils Relative %: 76 % (ref 43.0–77.0)
PLATELETS: 298 10*3/uL (ref 150.0–400.0)
RBC: 4.1 Mil/uL (ref 3.87–5.11)
RDW: 13.6 % (ref 11.5–15.5)
WBC: 11.3 10*3/uL — ABNORMAL HIGH (ref 4.0–10.5)

## 2016-04-07 LAB — COMPREHENSIVE METABOLIC PANEL
ALT: 18 U/L (ref 0–35)
AST: 15 U/L (ref 0–37)
Albumin: 4.3 g/dL (ref 3.5–5.2)
Alkaline Phosphatase: 71 U/L (ref 39–117)
BUN: 18 mg/dL (ref 6–23)
CALCIUM: 9.9 mg/dL (ref 8.4–10.5)
CHLORIDE: 102 meq/L (ref 96–112)
CO2: 30 meq/L (ref 19–32)
CREATININE: 0.97 mg/dL (ref 0.40–1.20)
GFR: 59.09 mL/min — AB (ref >60.00)
Glucose, Bld: 116 mg/dL — ABNORMAL HIGH (ref 70–99)
Potassium: 4.6 mEq/L (ref 3.5–5.1)
Sodium: 139 mEq/L (ref 135–145)
Total Bilirubin: 0.4 mg/dL (ref 0.2–1.2)
Total Protein: 7.2 g/dL (ref 6.0–8.3)

## 2016-04-07 NOTE — Progress Notes (Signed)
Subjective:   Patient ID: Jamie Matthews, female    DOB: 11-15-1940, 75 y.o.   MRN: [id number]  Jamie Matthews is a pleasant 75 y.o. year old female who presents to clinic today with Abdominal Pain; Vomiting; and Diarrhea  on 04/07/2016  HPI:  Followed by Dr. Hilarie Fredrickson for IBS/constipation/bloating/gas. Phone note reviewed from 02/26/16- he added Linzess 145 mcg daily to Amitiza 24 mcg twice daily. Advised to hold MiraLAx when starting Linzess and to call back in 7- 10 days.  She did not take Linzess. And is not currently taking Amitza or miralax since her symptoms started last week-  Nausea vomiting and diarrhea the day after eating [place] at a restaurant almost a week ago.  Nausea and vomiting have resolved.  Still has decreased appetite, abdominal cramping and loose stools.  No blood in stool.  No fever.  She does think symptoms are improving.  Colonoscopy 01/2016  Current Outpatient Prescriptions on File Prior to Visit  Medication Sig Dispense Refill  . AMITIZA 24 MCG capsule TAKE 1 CAPSULE BY MOUTH TWO TIMES DAILY WITH A MEAL 60 capsule 2  . atorvastatin (LIPITOR) 20 MG tablet TAKE 1 TABLET BY MOUTH ONE TIME DAILY 90 tablet 1  . BIOTIN 5000 PO Take by mouth.    [name] buPROPion (WELLBUTRIN XL) 150 MG 24 hr tablet TAKE 1 TABLET BY MOUTH DAILY 90 tablet 1  . Calcium Carbonate Antacid (ANTACID PO) Take by mouth as needed.    . desonide (DESOWEN) 0.05 % lotion     . dexlansoprazole (DEXILANT) 60 MG capsule Take 1 capsule (60 mg total) by mouth daily. 30 capsule 3  . diphenhydrAMINE (BENADRYL) 25 MG tablet Take 25 mg by mouth daily.    . Fluocinolone Acetonide (CAPEX) 0.01 % SHAM     . fluocinonide cream (LIDEX) 0.05 % Reported on 10/15/2015    . MAGNESIUM PO Take by mouth.    . memantine (NAMENDA) 10 MG tablet TAKE ONE TABLET TWICE A DAY 60 tablet 5  . Multiple Vitamins-Minerals (CENTRUM PO) Take one by mouth daily    . MYRBETRIQ 50 MG TB24 tablet TAKE 1 TABLET  EVERY DAY 90 tablet 0  . polyethylene glycol (MIRALAX / GLYCOLAX) packet Take 17 g by mouth daily.    [name] Propylene Glycol (SYSTANE BALANCE OP) Apply to eye as needed.    . ranitidine (ZANTAC) 150 MG tablet Take 1 tablet (150 mg total) by mouth 2 (two) times daily. 60 tablet 3  . Sodium Chloride-Sodium Bicarb (AYR SALINE NASAL RINSE NA) Place into the nose.    . traMADol (ULTRAM) 50 MG tablet Take 1 tablet (50 mg total) by mouth every 8 (eight) hours as needed. 30 tablet 0  . venlafaxine XR (EFFEXOR-XR) 150 MG 24 hr capsule TAKE ONE CAPSULE DAILY 90 capsule 1  . Vitamin D, Ergocalciferol, (DRISDOL) 50000 units CAPS capsule Take 1 capsule (50,000 Units total) by mouth every 7 (seven) days. 1 tab po weekly x 6 weeks, then continue VitD 1600 IU daily 6 capsule 0   Current Facility-Administered Medications on File Prior to Visit  Medication Dose Route Frequency Provider Last Rate Last Dose  . 0.9 %  sodium chloride infusion  500 mL Intravenous Continuous Jerene Bears, MD        Allergies  Allergen Reactions  . Sulfa Antibiotics Rash    Past Medical History:  Diagnosis Date  . Anxiety   . Arthritis   . Belching   . Bladder disorder  OVERACTIVE  . Bowel dysfunction    BLOCKAGE  . Constipation   . Depression   . Diverticulitis   . Fibromyalgia   . GERD (gastroesophageal reflux disease)   . Hyperlipidemia   . IBS (irritable bowel syndrome)   . Internal hemorrhoids   . Memory deficits   . Pneumonia 11/18/12  . Urinary incontinence     Past Surgical History:  Procedure Laterality Date  . BLADDER SUSPENSION  2004, 2012  . CATARACT EXTRACTION W/PHACO Right 08/27/2015   Procedure: CATARACT EXTRACTION PHACO AND INTRAOCULAR LENS PLACEMENT (IOC);  Surgeon: Estill Cotta, MD;  Location: [facility] ORS;  Service: Ophthalmology;  Laterality: Right;  [place]   1:00.2 AP%  22.5 CDE  23.67 fluid casette lot #[id number]  exp05/31/2018  . CATARACT EXTRACTION W/PHACO Left 10/15/2015   Procedure: CATARACT  EXTRACTION PHACO AND INTRAOCULAR LENS PLACEMENT (IOC);  Surgeon: Estill Cotta, MD;  Location: [facility] ORS;  Service: Ophthalmology;  Laterality: Left;  [place] 01:07 AP% 18.1 CDE 21.57 fluid pack lot # [id number]  . CHOLECYSTECTOMY    . TONSILLECTOMY  1947    Family History  Problem Relation Age of Onset  . Cancer Father   . Diabetes Father   . Heart disease Father   . Heart disease Mother   . Breast cancer Sister 7  . Colon cancer Neg Hx   . Esophageal cancer Neg Hx   . Rectal cancer Neg Hx   . Stomach cancer Neg Hx     Social History   Social History  . Marital status: Married    Spouse name: N/A  . Number of children: N/A  . Years of education: N/A   Occupational History  . Retired    Social History Main Topics  . Smoking status: Never Smoker  . Smokeless tobacco: Never Used  . Alcohol use Yes     Comment: OCCAS  . Drug use: No  . Sexual activity: Not Currently   Other Topics Concern  . Not on file   Social History Narrative   Recently moved with her husband to [place] from [place].   Husband is a retired Pharmacist, community.   No children.   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   The PMH, PSH, Social History, Family History, Medications, and allergies have been reviewed in [facility], and have been updated if relevant.  Current Outpatient Prescriptions on File Prior to Visit  Medication Sig Dispense Refill  . AMITIZA 24 MCG capsule TAKE 1 CAPSULE BY MOUTH TWO TIMES DAILY WITH A MEAL 60 capsule 2  . atorvastatin (LIPITOR) 20 MG tablet TAKE 1 TABLET BY MOUTH ONE TIME DAILY 90 tablet 1  . BIOTIN 5000 PO Take by mouth.    [name] buPROPion (WELLBUTRIN XL) 150 MG 24 hr tablet TAKE 1 TABLET BY MOUTH DAILY 90 tablet 1  . Calcium Carbonate Antacid (ANTACID PO) Take by mouth as needed.    . desonide (DESOWEN) 0.05 % lotion     . dexlansoprazole (DEXILANT) 60 MG capsule Take 1 capsule (60 mg total) by mouth daily. 30 capsule 3  . diphenhydrAMINE (BENADRYL) 25 MG tablet Take 25  mg by mouth daily.    . Fluocinolone Acetonide (CAPEX) 0.01 % SHAM     . fluocinonide cream (LIDEX) 0.05 % Reported on 10/15/2015    . MAGNESIUM PO Take by mouth.    . memantine (NAMENDA) 10 MG tablet TAKE ONE TABLET TWICE A DAY 60 tablet 5  . Multiple  Vitamins-Minerals (CENTRUM PO) Take one by mouth daily    . MYRBETRIQ 50 MG TB24 tablet TAKE 1 TABLET EVERY DAY 90 tablet 0  . polyethylene glycol (MIRALAX / GLYCOLAX) packet Take 17 g by mouth daily.    [name] Propylene Glycol (SYSTANE BALANCE OP) Apply to eye as needed.    . ranitidine (ZANTAC) 150 MG tablet Take 1 tablet (150 mg total) by mouth 2 (two) times daily. 60 tablet 3  . Sodium Chloride-Sodium Bicarb (AYR SALINE NASAL RINSE NA) Place into the nose.    . traMADol (ULTRAM) 50 MG tablet Take 1 tablet (50 mg total) by mouth every 8 (eight) hours as needed. 30 tablet 0  . venlafaxine XR (EFFEXOR-XR) 150 MG 24 hr capsule TAKE ONE CAPSULE DAILY 90 capsule 1  . Vitamin D, Ergocalciferol, (DRISDOL) 50000 units CAPS capsule Take 1 capsule (50,000 Units total) by mouth every 7 (seven) days. 1 tab po weekly x 6 weeks, then continue VitD 1600 IU daily 6 capsule 0   Current Facility-Administered Medications on File Prior to Visit  Medication Dose Route Frequency Provider Last Rate Last Dose  . 0.9 %  sodium chloride infusion  500 mL Intravenous Continuous Jerene Bears, MD        Allergies  Allergen Reactions  . Sulfa Antibiotics Rash    Past Medical History:  Diagnosis Date  . Anxiety   . Arthritis   . Belching   . Bladder disorder    OVERACTIVE  . Bowel dysfunction    BLOCKAGE  . Constipation   . Depression   . Diverticulitis   . Fibromyalgia   . GERD (gastroesophageal reflux disease)   . Hyperlipidemia   . IBS (irritable bowel syndrome)   . Internal hemorrhoids   . Memory deficits   . Pneumonia 11/18/12  . Urinary incontinence     Past Surgical History:  Procedure Laterality Date  . BLADDER SUSPENSION  2004, 2012  . CATARACT  EXTRACTION W/PHACO Right 08/27/2015   Procedure: CATARACT EXTRACTION PHACO AND INTRAOCULAR LENS PLACEMENT (IOC);  Surgeon: Estill Cotta, MD;  Location: [facility] ORS;  Service: Ophthalmology;  Laterality: Right;  [place]   1:00.2 AP%  22.5 CDE  23.67 fluid casette lot #[id number] H  exp05/31/2018  . CATARACT EXTRACTION W/PHACO Left 10/15/2015   Procedure: CATARACT EXTRACTION PHACO AND INTRAOCULAR LENS PLACEMENT (IOC);  Surgeon: Estill Cotta, MD;  Location: [facility] ORS;  Service: Ophthalmology;  Laterality: Left;  [place] 01:07 AP% 18.1 CDE 21.57 fluid pack lot # [id number]  . CHOLECYSTECTOMY    . TONSILLECTOMY  1947    Family History  Problem Relation Age of Onset  . Cancer Father   . Diabetes Father   . Heart disease Father   . Heart disease Mother   . Breast cancer Sister 44  . Colon cancer Neg Hx   . Esophageal cancer Neg Hx   . Rectal cancer Neg Hx   . Stomach cancer Neg Hx     Social History   Social History  . Marital status: Married    Spouse name: N/A  . Number of children: N/A  . Years of education: N/A   Occupational History  . Retired    Social History Main Topics  . Smoking status: Never Smoker  . Smokeless tobacco: Never Used  . Alcohol use Yes     Comment: OCCAS  . Drug use: No  . Sexual activity: Not Currently   Other Topics Concern  . Not on file   Social History Narrative  Recently moved with her husband to [place] from [place].   Husband is a retired Pharmacist, community.   No children.   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   The PMH, PSH, Social History, Family History, Medications, and allergies have been reviewed in [facility], and have been updated if relevant.   Review of Systems  Constitutional: Negative.   HENT: Negative.   Respiratory: Negative.   Cardiovascular: Negative.   Gastrointestinal: Positive for abdominal distention, abdominal pain, diarrhea, nausea and vomiting. Negative for anal bleeding, blood in stool, constipation and rectal pain.    Genitourinary: Negative.   Musculoskeletal: Negative.   Neurological: Negative.   All other systems reviewed and are negative.      Objective:    BP 132/76   Pulse 67   Temp 98 F (36.7 C) (Oral)   Wt 158 lb 4 oz (71.8 kg)   SpO2 98%   BMI 26.74 kg/m    Physical Exam  Constitutional: She is oriented to person, place, and time. She appears well-developed and well-nourished. No distress.  HENT:  Head: Normocephalic and atraumatic.  Eyes: Conjunctivae are normal.  Cardiovascular: Normal rate.   Pulmonary/Chest: Effort normal.  Abdominal: Soft. Bowel sounds are normal. She exhibits no distension and no mass. There is tenderness. There is no rebound and no guarding.  Musculoskeletal: Normal range of motion.  Neurological: She is alert and oriented to person, place, and time. No cranial nerve deficit.  Skin: She is not diaphoretic.  Psychiatric: She has a normal mood and affect. Her behavior is normal. Judgment and thought content normal.  Nursing note and vitals reviewed.         Assessment & Plan:   Diarrhea, unspecified type - Plan: Comprehensive metabolic panel, CBC with Differential/Platelet  Nausea vomiting and diarrhea No Follow-up on file.

## 2016-04-07 NOTE — Assessment & Plan Note (Signed)
Resolving. ? Infectious. Advised to push fluids and NOT restart her medications for constipation. Check labs today given amount of diarrhea she has had- rule out electrolyte and kidney abnormalities. The patient indicates understanding of these issues and agrees with the plan. Call or return to clinic prn if these symptoms worsen or fail to improve as anticipated. Orders Placed This Encounter  Procedures  . Comprehensive metabolic panel  . CBC with Differential/Platelet

## 2016-04-09 DIAGNOSIS — H1031 Unspecified acute conjunctivitis, right eye: Secondary | ICD-10-CM | POA: Diagnosis not present

## 2016-04-14 DIAGNOSIS — H1031 Unspecified acute conjunctivitis, right eye: Secondary | ICD-10-CM | POA: Diagnosis not present

## 2016-04-22 DIAGNOSIS — H1031 Unspecified acute conjunctivitis, right eye: Secondary | ICD-10-CM | POA: Diagnosis not present

## 2016-04-28 ENCOUNTER — Encounter: Payer: Self-pay | Admitting: Family Medicine

## 2016-04-28 ENCOUNTER — Ambulatory Visit (INDEPENDENT_AMBULATORY_CARE_PROVIDER_SITE_OTHER): Payer: Medicare Other | Admitting: Family Medicine

## 2016-04-28 ENCOUNTER — Other Ambulatory Visit: Payer: Self-pay | Admitting: Family Medicine

## 2016-04-28 ENCOUNTER — Other Ambulatory Visit: Payer: Self-pay | Admitting: Internal Medicine

## 2016-04-28 DIAGNOSIS — H1033 Unspecified acute conjunctivitis, bilateral: Secondary | ICD-10-CM | POA: Diagnosis not present

## 2016-04-28 DIAGNOSIS — J0100 Acute maxillary sinusitis, unspecified: Secondary | ICD-10-CM | POA: Diagnosis not present

## 2016-04-28 DIAGNOSIS — J329 Chronic sinusitis, unspecified: Secondary | ICD-10-CM | POA: Insufficient documentation

## 2016-04-28 DIAGNOSIS — H109 Unspecified conjunctivitis: Secondary | ICD-10-CM | POA: Insufficient documentation

## 2016-04-28 MED ORDER — ERYTHROMYCIN 5 MG/GM OP OINT: 1.0000 "application " | TOPICAL_OINTMENT | Freq: Two times a day (BID) | OPHTHALMIC | 0 refills | Status: DC

## 2016-04-28 MED ORDER — AMOXICILLIN-POT CLAVULANATE 875-125 MG PO TABS
1.0000 | ORAL_TABLET | Freq: Two times a day (BID) | ORAL | 0 refills | Status: DC
Start: 2016-04-28 — End: 2016-06-10

## 2016-04-28 NOTE — Patient Instructions (Signed)
Nice to see you. You likely have sinus infection. We will treat you with Augmentin. We will place you on erythromycin eye ointment for conjunctivitis. If you develop shortness of breath, cough productive of blood, fevers, vision changes, pain in your eyes, discomfort with light, or any new or changing symptoms please seek medical attention immediately.

## 2016-04-28 NOTE — Assessment & Plan Note (Signed)
Patient with 7 days of symptoms now most consistent with sinusitis. Also with symptoms of conjunctivitis. No red flag symptoms for eyes. Vision check today reassuring. Benign lung exam. Stable vital signs. We will treat with Augmentin. Erythromycin eye ointment for her eyes. We'll take a probiotic or eat yogurt with the Augmentin. Given return precautions.

## 2016-04-28 NOTE — Assessment & Plan Note (Signed)
Symptoms most consistent with conjunctivitis. Some matting noted. No red flags noted. Vision check reassuring. We'll treat with erythromycin ointment to see if this is beneficial. She'll monitor and is given return precautions.

## 2016-04-28 NOTE — Progress Notes (Signed)
  Tommi Rumps, MD Phone: [phone]  Jamie Matthews is a 75 y.o. female who presents today for same-day visit.  Patient notes 7 days of cough, congestion, cough productive of yellow mucus, and blowing yellow mucus out of her nose. Notes she tried some over-the-counter medicines that have not been that beneficial. No significant ear discomfort. She does note her eyes have been itchy, scratchy, and watery as well. Feels scratchy though no foreign body sensation. No matting. No photophobia. No vision change. No eye pain. Right worse than left. No shortness of breath. No fevers.  PMH: nonsmoker.   ROS see history of present illness  Objective  Physical Exam Vitals:   04/28/16 0950  BP: 128/70  Pulse: 75  Temp: 98.1 F (36.7 C)    BP Readings from Last 3 Encounters:  04/28/16 128/70  04/07/16 132/76  03/17/16 116/68   Wt Readings from Last 3 Encounters:  04/28/16 168 lb 9.6 oz (76.5 kg)  04/07/16 158 lb 4 oz (71.8 kg)  03/17/16 172 lb 4 oz (78.1 kg)    Physical Exam  Constitutional: No distress.  HENT:  Head: Normocephalic and atraumatic.  Mouth/Throat: Oropharynx is clear and moist. No oropharyngeal exudate.  Normal TMs bilaterally  Eyes: Pupils are equal, round, and reactive to light.  Mild conjunctival erythema, mild matting of the eye lashes, clear discharge, no gross abnormality of the cornea's bilaterally  Neck: Neck supple.  Cardiovascular: Normal rate, regular rhythm and normal heart sounds.   Pulmonary/Chest: Effort normal and breath sounds normal.  Lymphadenopathy:    She has no cervical adenopathy.  Neurological: She is alert. Gait normal.  Skin: Skin is warm and dry. She is not diaphoretic.     Assessment/Plan: Please see individual problem list.  Sinusitis Patient with 7 days of symptoms now most consistent with sinusitis. Also with symptoms of conjunctivitis. No red flag symptoms for eyes. Vision check today reassuring. Benign lung  exam. Stable vital signs. We will treat with Augmentin. Erythromycin eye ointment for her eyes. We'll take a probiotic or eat yogurt with the Augmentin. Given return precautions.  Conjunctivitis Symptoms most consistent with conjunctivitis. Some matting noted. No red flags noted. Vision check reassuring. We'll treat with erythromycin ointment to see if this is beneficial. She'll monitor and is given return precautions.   No orders of the defined types were placed in this encounter.   Meds ordered this encounter  Medications  . amoxicillin-clavulanate (AUGMENTIN) 875-125 MG tablet    Sig: Take 1 tablet by mouth 2 (two) times daily.    Dispense:  14 tablet    Refill:  0  . erythromycin ophthalmic ointment    Sig: Place 1 application into both eyes 2 (two) times daily.    Dispense:  3.5 g    Refill:  0    Tommi Rumps, MD [place]

## 2016-04-28 NOTE — Progress Notes (Signed)
Pre visit review using our clinic review tool, if applicable. No additional management support is needed unless otherwise documented below in the visit note. 

## 2016-04-29 ENCOUNTER — Telehealth: Payer: Self-pay | Admitting: Family Medicine

## 2016-04-29 NOTE — Telephone Encounter (Signed)
Pt called and wanted to know if you would call in a cough syrup. Please advise, thank you!  [place], [place] [facility] [address]  Call pt @ [phone]

## 2016-04-29 NOTE — Telephone Encounter (Signed)
Left message to call.

## 2016-04-29 NOTE — Telephone Encounter (Signed)
Last seen 1.24.17.

## 2016-04-29 NOTE — Telephone Encounter (Signed)
Please determine if the patient is currently taking tramadol. Once I know this information we can provide the patient with a prescription for a cough suppressant. Thanks.

## 2016-04-30 MED ORDER — HYDROCODONE-HOMATROPINE 5-1.5 MG/5ML PO SYRP: 5.0000 mL | ORAL_SOLUTION | Freq: Three times a day (TID) | ORAL | 0 refills | Status: DC | PRN

## 2016-04-30 NOTE — Telephone Encounter (Signed)
Patient advised and verbalized understanding 

## 2016-04-30 NOTE — Telephone Encounter (Signed)
Hycodan cough syrup printed out. She will need to come pick this up. This may make her drowsy so she should be wary when she takes this. If her symptoms are not improving she should let us know.

## 2016-04-30 NOTE — Telephone Encounter (Signed)
Spoke with patient and she is not taking tramadol

## 2016-05-06 ENCOUNTER — Other Ambulatory Visit: Payer: Self-pay | Admitting: Family Medicine

## 2016-05-06 DIAGNOSIS — E559 Vitamin D deficiency, unspecified: Secondary | ICD-10-CM

## 2016-05-06 DIAGNOSIS — R946 Abnormal results of thyroid function studies: Secondary | ICD-10-CM

## 2016-05-12 ENCOUNTER — Other Ambulatory Visit (INDEPENDENT_AMBULATORY_CARE_PROVIDER_SITE_OTHER): Payer: Medicare Other

## 2016-05-12 DIAGNOSIS — E559 Vitamin D deficiency, unspecified: Secondary | ICD-10-CM | POA: Diagnosis not present

## 2016-05-12 DIAGNOSIS — R946 Abnormal results of thyroid function studies: Secondary | ICD-10-CM

## 2016-05-12 LAB — VITAMIN D 25 HYDROXY (VIT D DEFICIENCY, FRACTURES): VITD: 55.66 ng/mL (ref 30.00–100.00)

## 2016-05-12 LAB — TSH: TSH: 3.71 u[IU]/mL (ref 0.35–4.50)

## 2016-05-12 LAB — T4, FREE: Free T4: 0.76 ng/dL (ref 0.60–1.60)

## 2016-05-13 ENCOUNTER — Encounter: Payer: Self-pay | Admitting: *Deleted

## 2016-05-22 ENCOUNTER — Encounter: Payer: Self-pay | Admitting: *Deleted

## 2016-05-27 ENCOUNTER — Other Ambulatory Visit: Payer: Self-pay | Admitting: Internal Medicine

## 2016-06-04 ENCOUNTER — Other Ambulatory Visit: Payer: Self-pay | Admitting: Internal Medicine

## 2016-06-10 ENCOUNTER — Encounter: Payer: Self-pay | Admitting: Family Medicine

## 2016-06-10 ENCOUNTER — Ambulatory Visit (INDEPENDENT_AMBULATORY_CARE_PROVIDER_SITE_OTHER): Payer: Medicare Other | Admitting: Family Medicine

## 2016-06-10 VITALS — BP 122/68 | HR 64 | Temp 97.6°F | Wt 166.8 lb

## 2016-06-10 DIAGNOSIS — R05 Cough: Secondary | ICD-10-CM

## 2016-06-10 DIAGNOSIS — R059 Cough, unspecified: Secondary | ICD-10-CM | POA: Insufficient documentation

## 2016-06-10 MED ORDER — BENZONATATE 200 MG PO CAPS: 200.0000 mg | ORAL_CAPSULE | Freq: Three times a day (TID) | ORAL | 0 refills | Status: DC | PRN

## 2016-06-10 MED ORDER — AMOXICILLIN-POT CLAVULANATE 875-125 MG PO TABS: 1.0000 | ORAL_TABLET | Freq: Two times a day (BID) | ORAL | 0 refills | Status: DC

## 2016-06-10 NOTE — Progress Notes (Signed)
SUBJECTIVE:  Jamie Matthews is a 76 y.o. female who complains of coryza, congestion, productive cough and bilateral sinus pain for 14 days. She denies a history of anorexia and chest pain and denies a history of asthma. Patient denies smoke cigarettes.   Finished augmentin for sinus infections last month.  Now sinus pressure and cough have worsened.  Current Outpatient Prescriptions on File Prior to Visit  Medication Sig Dispense Refill  . AMITIZA 24 MCG capsule TAKE 1 CAPSULE BY MOUTH TWO TIMES DAILY WITH A MEAL 60 capsule 2  . atorvastatin (LIPITOR) 20 MG tablet TAKE 1 TABLET BY MOUTH ONE TIME DAILY 90 tablet 1  . BIOTIN 5000 PO Take by mouth.    [name] buPROPion (WELLBUTRIN XL) 150 MG 24 hr tablet TAKE 1 TABLET BY MOUTH DAILY 90 tablet 1  . Calcium Carbonate Antacid (ANTACID PO) Take by mouth as needed.    . desonide (DESOWEN) 0.05 % lotion     . DEXILANT 60 MG capsule TAKE ONE CAPSULE BY MOUTH ONCE DAILY 30 capsule 0  . diphenhydrAMINE (BENADRYL) 25 MG tablet Take 25 mg by mouth daily.    [name] erythromycin ophthalmic ointment Place 1 application into both eyes 2 (two) times daily. 3.5 g 0  . Fluocinolone Acetonide (CAPEX) 0.01 % SHAM     . fluocinonide cream (LIDEX) 0.05 % Reported on 10/15/2015    . MAGNESIUM PO Take by mouth.    . memantine (NAMENDA) 10 MG tablet TAKE ONE TABLET TWICE A DAY 180 tablet 1  . Multiple Vitamins-Minerals (CENTRUM PO) Take one by mouth daily    . MYRBETRIQ 50 MG TB24 tablet TAKE 1 TABLET EVERY DAY 90 tablet 0  . polyethylene glycol (MIRALAX / GLYCOLAX) packet Take 17 g by mouth daily.    [name] Propylene Glycol (SYSTANE BALANCE OP) Apply to eye as needed.    . ranitidine (ZANTAC) 150 MG tablet Take 1 tablet (150 mg total) by mouth 2 (two) times daily. 60 tablet 3  . Sodium Chloride-Sodium Bicarb (AYR SALINE NASAL RINSE NA) Place into the nose.    . traMADol (ULTRAM) 50 MG tablet Take 1 tablet (50 mg total) by mouth every 8 (eight) hours as needed. 30 tablet  0  . venlafaxine XR (EFFEXOR-XR) 150 MG 24 hr capsule TAKE ONE CAPSULE DAILY 90 capsule 1  . Vitamin D, Ergocalciferol, (DRISDOL) 50000 units CAPS capsule TAKE ONE CAPSULE BY MOUTH EVERY 7 DAYS. TAKE ONE CAPSULE WEEKLY FOR 6WEEKS, THEN CONTINUE  VIT D 1600 IU DAILY 6 capsule 0   Current Facility-Administered Medications on File Prior to Visit  Medication Dose Route Frequency Provider Last Rate Last Dose  . 0.9 %  sodium chloride infusion  500 mL Intravenous Continuous Jerene Bears, MD        Allergies  Allergen Reactions  . Sulfa Antibiotics Rash    Past Medical History:  Diagnosis Date  . Anxiety   . Arthritis   . Belching   . Bladder disorder    OVERACTIVE  . Bowel dysfunction    BLOCKAGE  . Constipation   . Depression   . Diverticulitis   . Fibromyalgia   . GERD (gastroesophageal reflux disease)   . Hyperlipidemia   . IBS (irritable bowel syndrome)   . Internal hemorrhoids   . Memory deficits   . Pneumonia 11/18/12  . Serrated adenoma of colon   . Urinary incontinence     Past Surgical History:  Procedure Laterality Date  . BLADDER SUSPENSION  2004, 2012  . CATARACT EXTRACTION W/PHACO Right 08/27/2015   Procedure: CATARACT EXTRACTION PHACO AND INTRAOCULAR LENS PLACEMENT (IOC);  Surgeon: Estill Cotta, MD;  Location: [facility] ORS;  Service: Ophthalmology;  Laterality: Right;  [place]   1:00.2 AP%  22.5 CDE  23.67 fluid casette lot #[id number]  exp05/31/2018  . CATARACT EXTRACTION W/PHACO Left 10/15/2015   Procedure: CATARACT EXTRACTION PHACO AND INTRAOCULAR LENS PLACEMENT (IOC);  Surgeon: Estill Cotta, MD;  Location: [facility] ORS;  Service: Ophthalmology;  Laterality: Left;  [place] 01:07 AP% 18.1 CDE 21.57 fluid pack lot # [id number]  . CHOLECYSTECTOMY    . TONSILLECTOMY  1947    Family History  Problem Relation Age of Onset  . Cancer Father   . Diabetes Father   . Heart disease Father   . Heart disease Mother   . Breast cancer Sister 44  . Colon cancer Neg Hx   .  Esophageal cancer Neg Hx   . Rectal cancer Neg Hx   . Stomach cancer Neg Hx     Social History   Social History  . Marital status: Married    Spouse name: N/A  . Number of children: N/A  . Years of education: N/A   Occupational History  . Retired    Social History Main Topics  . Smoking status: Never Smoker  . Smokeless tobacco: Never Used  . Alcohol use Yes     Comment: OCCAS  . Drug use: No  . Sexual activity: Not Currently   Other Topics Concern  . Not on file   Social History Narrative   Recently moved with her husband to [place] from [place].   Husband is a retired Pharmacist, community.   No children.   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   The PMH, PSH, Social History, Family History, Medications, and allergies have been reviewed in [facility], and have been updated if relevant.  OBJECTIVE:  BP 122/68   Pulse 64   Temp 97.6 F (36.4 C) (Oral)   Wt 166 lb 12 oz (75.6 kg)   SpO2 93%   BMI 28.18 kg/m   She appears well, vital signs are as noted. Ears normal.  Throat and pharynx normal.  Neck supple. No adenopathy in the neck. Nose is congested. Sinuses tender. The chest is clear, without wheezes or rales.  ASSESSMENT:  sinusitis  PLAN: Given duration and progression of symptoms, will treat for bacterial sinusitis.  Symptomatic therapy suggested: push fluids, rest and return office visit prn if symptoms persist or worsen. . Call or return to clinic prn if these symptoms worsen or fail to improve as anticipated.

## 2016-06-10 NOTE — Patient Instructions (Signed)
Great to see you. Please finish course of augmentin, drink fluids.  Tessalon as needed for cough.

## 2016-06-11 ENCOUNTER — Other Ambulatory Visit: Payer: Self-pay

## 2016-06-16 DIAGNOSIS — M2041 Other hammer toe(s) (acquired), right foot: Secondary | ICD-10-CM | POA: Diagnosis not present

## 2016-06-23 DIAGNOSIS — H16223 Keratoconjunctivitis sicca, not specified as Sjogren's, bilateral: Secondary | ICD-10-CM | POA: Diagnosis not present

## 2016-06-24 ENCOUNTER — Encounter: Payer: Self-pay | Admitting: Internal Medicine

## 2016-06-24 ENCOUNTER — Ambulatory Visit (INDEPENDENT_AMBULATORY_CARE_PROVIDER_SITE_OTHER): Payer: Medicare Other | Admitting: Internal Medicine

## 2016-06-24 VITALS — BP 96/60 | HR 64 | Ht 63.0 in | Wt 167.4 lb

## 2016-06-24 DIAGNOSIS — K5900 Constipation, unspecified: Secondary | ICD-10-CM | POA: Diagnosis not present

## 2016-06-24 DIAGNOSIS — Z86010 Personal history of colonic polyps: Secondary | ICD-10-CM | POA: Diagnosis not present

## 2016-06-24 DIAGNOSIS — K219 Gastro-esophageal reflux disease without esophagitis: Secondary | ICD-10-CM

## 2016-06-24 MED ORDER — DEXLANSOPRAZOLE 60 MG PO CPDR: 1.0000 | DELAYED_RELEASE_CAPSULE | Freq: Every day | ORAL | 4 refills | Status: DC

## 2016-06-24 MED ORDER — RANITIDINE HCL 150 MG PO TABS: 150.0000 mg | ORAL_TABLET | Freq: Two times a day (BID) | ORAL | 4 refills | Status: DC

## 2016-06-24 MED ORDER — LINACLOTIDE 290 MCG PO CAPS: 290.0000 ug | ORAL_CAPSULE | Freq: Every day | ORAL | 4 refills | Status: DC

## 2016-06-24 MED ORDER — LUBIPROSTONE 24 MCG PO CAPS: ORAL_CAPSULE | ORAL | 5 refills | Status: DC

## 2016-06-24 NOTE — Progress Notes (Signed)
Subjective:    Patient ID: Jamie Matthews, female    DOB: [birth year], 76 y.o.   MRN: [id number]  HPI Jamie Matthews is a 76 yo female with PMH of GERD, colon polyps, chronic constipation with IBS who is here for followup.  She is here alone today. Since her last visit in the office in June 2017 she had a colonoscopy after a positive Cologuard test. Colonoscopy was performed on 02/14/2016 which revealed a 12 mm hepatic flexure polyp which was removed with cold snare and a 5 mm ascending polyp removed with cold snare. External hemorrhoids were found. These polyps were benign sessile serrated polyps without dysplasia.  She did well after this procedure. She has continued to have issues with chronic constipation. She has been on Amitiza 24 g twice a day with MiraLAX daily. She called in with constipation we started Linzess 145 g daily. This helped but she still has intermittent constipation. She goes to the bathroom usually once per day but does skip several days at times. The skip days resulted in bloating and lower abdominal discomfort. No blood in her stool or melena. Good appetite. No issues with her reflux after changing therapy. She continues Dexilant 60 mg and ranitidine 150 mg twice a day. No dysphagia or odynophagia.   Review of Systems As per HPI, otherwise negative  Current Medications, Allergies, Past Medical History, Past Surgical History, Family History and Social History were reviewed in [employer] record.     Objective:   Physical Exam BP 96/60 (BP Location: Left Arm, Patient Position: Sitting, Cuff Size: Normal)   Pulse 64   Ht '5\' 3"'$  (1.6 m)   Wt 167 lb 6 oz (75.9 kg)   BMI 29.65 kg/m  Constitutional: Well-developed and well-nourished. No distress. HEENT: Normocephalic and atraumatic.  Conjunctivae are normal.  No scleral icterus. Neck: Neck supple. Trachea midline. Cardiovascular: Normal rate, regular rhythm and intact distal  pulses. No M/R/G Pulmonary/chest: Effort normal and breath sounds normal. No wheezing, rales or rhonchi. Abdominal: Soft, obese, nontender, nondistended. Bowel sounds active throughout.  Extremities: no clubbing, cyanosis, or edema Neurological: Alert and oriented to person place and time. Skin: Skin is warm and dry. No rashes noted. Psychiatric: Normal mood and affect. Behavior is normal.  CBC    Component Value Date/Time   WBC 11.3 (H) 04/07/2016 1510   RBC 4.10 04/07/2016 1510   HGB 13.2 04/07/2016 1510   HGB 12.6 06/12/2014 0518   HCT 38.9 04/07/2016 1510   HCT 37.9 06/12/2014 0518   PLT 298.0 04/07/2016 1510   PLT 204 06/12/2014 0518   MCV 94.8 04/07/2016 1510   MCV 96 06/12/2014 0518   MCH 32.1 07/12/2015 1437   MCHC 34.0 04/07/2016 1510   RDW 13.6 04/07/2016 1510   RDW 13.0 06/12/2014 0518   LYMPHSABS 1.4 04/07/2016 1510   LYMPHSABS 0.6 (L) 06/12/2014 0518   MONOABS 1.0 04/07/2016 1510   MONOABS 1.0 (H) 06/12/2014 0518   EOSABS 0.2 04/07/2016 1510   EOSABS 0.1 06/12/2014 0518   BASOSABS 0.0 04/07/2016 1510   BASOSABS 0.0 06/12/2014 0518   CMP     Component Value Date/Time   NA 139 04/07/2016 1510   NA 139 06/12/2014 0518   K 4.6 04/07/2016 1510   K 4.4 06/12/2014 0518   CL 102 04/07/2016 1510   CL 104 06/12/2014 0518   CO2 30 04/07/2016 1510   CO2 26 06/12/2014 0518   GLUCOSE 116 (H) 04/07/2016 1510   GLUCOSE  93 06/12/2014 0518   BUN 18 04/07/2016 1510   BUN 17 06/12/2014 0518   CREATININE 0.97 04/07/2016 1510   CREATININE 0.80 06/12/2014 0518   CALCIUM 9.9 04/07/2016 1510   CALCIUM 8.2 (L) 06/12/2014 0518   PROT 7.2 04/07/2016 1510   PROT 7.6 06/10/2014 1649   ALBUMIN 4.3 04/07/2016 1510   ALBUMIN 4.1 06/10/2014 1649   AST 15 04/07/2016 1510   AST 32 06/10/2014 1649   ALT 18 04/07/2016 1510   ALT 37 06/10/2014 1649   ALKPHOS 71 04/07/2016 1510   ALKPHOS 92 06/10/2014 1649   BILITOT 0.4 04/07/2016 1510   BILITOT 0.6 06/10/2014 1649   GFRNONAA 57  (L) 07/12/2015 1437   GFRNONAA >60 06/12/2014 0518   GFRNONAA >60 04/30/2013 2241   GFRAA >60 07/12/2015 1437   GFRAA >60 06/12/2014 0518   GFRAA >60 04/30/2013 2241       Assessment & Plan:  76 yo female with PMH of GERD, colon polyps, chronic constipation with IBS who is here for followup.  1. GERD -- controlled now with Dexilant 60 mg daily and ranitidine 150 mg twice a day. Continue this therapy. Continue GERD diet and reflux precautions. Cough also improved with reflux control.  2. Chronic constipation -- continue Amitiza 24 g twice a day and increase Linzess to 290 g daily. She will hold MiraLAX for now though it can be used if needed. We discussed that she is on high-dose laxatives, but she has proven she tolerates these medications and they are helpful. Colonoscopy performed recently  3. History of colon polyps -- colonoscopy in September 2017; would not require additional screening or surveillance due to age. She is aware of this recommendation and agrees  6-12 months follow-up, sooner if necessary 15 minutes spent with the patient today. Greater than 50% was spent in counseling and coordination of care with the patient

## 2016-06-24 NOTE — Patient Instructions (Signed)
Continue Amitiza 24 mcg twic daily.  We have sent the following medications to your pharmacy for you to pick up at your convenience: Linzess 290 mcg daily (increase from previous dosage)  Continue Miralax as needed.  Continue Dexilant daily.  Continue Zantac twice daily.  Please follow up with Dr Hilarie Fredrickson in 6 months.  If you are age 78 or older, your body mass index should be between 23-30. Your Body mass index is 29.65 kg/m. If this is out of the aforementioned range listed, please consider follow up with your Primary Care Provider.  If you are age 35 or younger, your body mass index should be between 19-25. Your Body mass index is 29.65 kg/m. If this is out of the aformentioned range listed, please consider follow up with your Primary Care Provider.

## 2016-06-25 ENCOUNTER — Inpatient Hospital Stay: Admit: 2016-06-25 | Payer: Medicare Other | Admitting: Orthopedic Surgery

## 2016-06-25 SURGERY — ARTHROPLASTY, KNEE, TOTAL, USING IMAGELESS COMPUTER-ASSISTED NAVIGATION
Anesthesia: Choice | Laterality: Left

## 2016-06-30 DIAGNOSIS — L821 Other seborrheic keratosis: Secondary | ICD-10-CM | POA: Diagnosis not present

## 2016-06-30 DIAGNOSIS — L720 Epidermal cyst: Secondary | ICD-10-CM | POA: Diagnosis not present

## 2016-06-30 DIAGNOSIS — L820 Inflamed seborrheic keratosis: Secondary | ICD-10-CM | POA: Diagnosis not present

## 2016-07-30 ENCOUNTER — Other Ambulatory Visit: Payer: Self-pay | Admitting: Family Medicine

## 2016-08-13 ENCOUNTER — Telehealth: Payer: Self-pay

## 2016-08-13 DIAGNOSIS — L820 Inflamed seborrheic keratosis: Secondary | ICD-10-CM | POA: Diagnosis not present

## 2016-08-13 DIAGNOSIS — L720 Epidermal cyst: Secondary | ICD-10-CM | POA: Diagnosis not present

## 2016-08-13 NOTE — Telephone Encounter (Signed)
Pt left v/m wanting to know if Dr Deborra Medina thought the new med for bladder control, uri varx is a good med to try. Pt request cb.

## 2016-08-15 NOTE — Telephone Encounter (Signed)
I do not have a lot of personal experience with it as it is relatively new and more expensive (therefore unaffordable for a lot of patients).  From what I know about it, it does have some good results.

## 2016-08-15 NOTE — Telephone Encounter (Signed)
Called pt back to notified about urimax---and pt still would like to try it.Jamie KitchenMarland KitchenMarland Matthews

## 2016-08-15 NOTE — Telephone Encounter (Signed)
I think there must be some confusion. I thought she was asking for an rx for her husband, Collier Jamie Matthews.  Urimax is like flomax for BPH for men.  I would not feel comfortable prescribing it for her.

## 2016-08-15 NOTE — Telephone Encounter (Signed)
Does she mean urimax?

## 2016-08-15 NOTE — Telephone Encounter (Signed)
Called pt to clarify the medication which is the urimax

## 2016-08-18 NOTE — Telephone Encounter (Signed)
Called pt back with the Rx info. Is for female like flomax...the patient stated is for her not the husband and will call back to figure name of the medication she needed.

## 2016-08-21 ENCOUNTER — Ambulatory Visit (INDEPENDENT_AMBULATORY_CARE_PROVIDER_SITE_OTHER): Payer: Medicare Other | Admitting: Family Medicine

## 2016-08-21 VITALS — BP 126/78 | HR 88 | Temp 97.6°F | Wt 167.0 lb

## 2016-08-21 DIAGNOSIS — R05 Cough: Secondary | ICD-10-CM | POA: Insufficient documentation

## 2016-08-21 DIAGNOSIS — R053 Chronic cough: Secondary | ICD-10-CM | POA: Insufficient documentation

## 2016-08-21 DIAGNOSIS — R059 Cough, unspecified: Secondary | ICD-10-CM

## 2016-08-21 DIAGNOSIS — E785 Hyperlipidemia, unspecified: Secondary | ICD-10-CM

## 2016-08-21 LAB — LIPID PANEL
CHOL/HDL RATIO: 5
Cholesterol: 237 mg/dL — ABNORMAL HIGH (ref 0–200)
HDL: 43.5 mg/dL (ref >39.00)
LDL CALC: 175 mg/dL — AB (ref 0–99)
NonHDL: 193.08
TRIGLYCERIDES: 89 mg/dL (ref 0.0–149.0)
VLDL: 17.8 mg/dL (ref 0.0–40.0)

## 2016-08-21 MED ORDER — BENZONATATE 100 MG PO CAPS: 100.0000 mg | ORAL_CAPSULE | Freq: Two times a day (BID) | ORAL | 0 refills | Status: DC | PRN

## 2016-08-21 MED ORDER — ATORVASTATIN CALCIUM 20 MG PO TABS: ORAL_TABLET | ORAL | 1 refills | Status: DC

## 2016-08-21 NOTE — Assessment & Plan Note (Signed)
Due for labs today. Lipitor refills sent. The patient indicates understanding of these issues and agrees with the plan.

## 2016-08-21 NOTE — Progress Notes (Signed)
Subjective:   Patient ID: Jamie Matthews, female    DOB: 12-Nov-1940, 76 y.o.   MRN: [id number]  Jamie Matthews is a pleasant 76 y.o. year old female who presents to clinic today with Medication Refill and Cough (pt stated coughing clear/yellow mucus for 4 days)  on 08/21/2016  HPI:  HLD- on weight watchers and not sure if she still needs to take Lipitor 20 mg daily.  She is eating better. Wt Readings from Last 3 Encounters:  08/21/16 167 lb (75.8 kg)  06/24/16 167 lb 6 oz (75.9 kg)  06/10/16 166 lb 12 oz (75.6 kg)   Lab Results  Component Value Date   CHOL 167 07/18/2015   HDL 44.80 07/18/2015   LDLCALC 107 (H) 07/18/2015   LDLDIRECT 175.9 02/05/2012   TRIG 74.0 07/18/2015   CHOLHDL 4 07/18/2015    Cough- started cough 4 days ago- productive of clear/yellowish mucous.  Denies fever, CP or SOB.  Has had some nasal congestion.   Current Outpatient Prescriptions on File Prior to Visit  Medication Sig Dispense Refill  . BIOTIN 5000 PO Take by mouth.    [name] buPROPion (WELLBUTRIN XL) 150 MG 24 hr tablet TAKE 1 TABLET BY MOUTH DAILY 90 tablet 1  . Calcium Carbonate Antacid (ANTACID PO) Take by mouth as needed.    . desonide (DESOWEN) 0.05 % lotion     . dexlansoprazole (DEXILANT) 60 MG capsule Take 1 capsule (60 mg total) by mouth daily. 30 capsule 4  . diphenhydrAMINE (BENADRYL) 25 MG tablet Take 25 mg by mouth daily.    [name] linaclotide (LINZESS) 290 MCG CAPS capsule Take 1 capsule (290 mcg total) by mouth daily before breakfast. 30 capsule 4  . lubiprostone (AMITIZA) 24 MCG capsule TAKE 1 CAPSULE BY MOUTH TWO TIMES DAILY WITH A MEAL 60 capsule 5  . MAGNESIUM PO Take by mouth.    . memantine (NAMENDA) 10 MG tablet TAKE ONE TABLET TWICE A DAY 180 tablet 1  . Multiple Vitamins-Minerals (CENTRUM PO) Take one by mouth daily    . MYRBETRIQ 50 MG TB24 tablet TAKE 1 TABLET BY MOUTH DAILY 90 tablet 0  . polyethylene glycol (MIRALAX / GLYCOLAX) packet Take 17 g by  mouth daily.    [name] Propylene Glycol (SYSTANE BALANCE OP) Apply to eye as needed.    . ranitidine (ZANTAC) 150 MG tablet Take 1 tablet (150 mg total) by mouth 2 (two) times daily. 60 tablet 4  . Sodium Chloride-Sodium Bicarb (AYR SALINE NASAL RINSE NA) Place into the nose.    . venlafaxine XR (EFFEXOR-XR) 150 MG 24 hr capsule TAKE ONE CAPSULE DAILY 90 capsule 1   Current Facility-Administered Medications on File Prior to Visit  Medication Dose Route Frequency Provider Last Rate Last Dose  . 0.9 %  sodium chloride infusion  500 mL Intravenous Continuous Jerene Bears, MD        Allergies  Allergen Reactions  . Sulfa Antibiotics Rash    Past Medical History:  Diagnosis Date  . Anxiety   . Arthritis   . Belching   . Bladder disorder    OVERACTIVE  . Bowel dysfunction    BLOCKAGE  . Constipation   . Depression   . Diverticulitis   . Fibromyalgia   . GERD (gastroesophageal reflux disease)   . Hyperlipidemia   . IBS (irritable bowel syndrome)   . Internal hemorrhoids   . Memory deficits   . Pneumonia 11/18/12  . Serrated adenoma of colon   .  Urinary incontinence     Past Surgical History:  Procedure Laterality Date  . BLADDER SUSPENSION  2004, 2012  . CATARACT EXTRACTION W/PHACO Right 08/27/2015   Procedure: CATARACT EXTRACTION PHACO AND INTRAOCULAR LENS PLACEMENT (IOC);  Surgeon: Estill Cotta, MD;  Location: [facility] ORS;  Service: Ophthalmology;  Laterality: Right;  [place]   1:00.2 AP%  22.5 CDE  23.67 fluid casette lot #[id number]  exp05/31/2018  . CATARACT EXTRACTION W/PHACO Left 10/15/2015   Procedure: CATARACT EXTRACTION PHACO AND INTRAOCULAR LENS PLACEMENT (IOC);  Surgeon: Estill Cotta, MD;  Location: [facility] ORS;  Service: Ophthalmology;  Laterality: Left;  [place] 01:07 AP% 18.1 CDE 21.57 fluid pack lot # [id number]  . CHOLECYSTECTOMY    . TONSILLECTOMY  1947    Family History  Problem Relation Age of Onset  . Cancer Father   . Diabetes Father   . Heart disease Father     . Heart disease Mother   . Breast cancer Sister 24  . Colon cancer Neg Hx   . Esophageal cancer Neg Hx   . Rectal cancer Neg Hx   . Stomach cancer Neg Hx     Social History   Social History  . Marital status: Married    Spouse name: N/A  . Number of children: N/A  . Years of education: N/A   Occupational History  . Retired    Social History Main Topics  . Smoking status: Never Smoker  . Smokeless tobacco: Never Used  . Alcohol use Yes     Comment: OCCAS  . Drug use: No  . Sexual activity: Not Currently   Other Topics Concern  . Not on file   Social History Narrative   Recently moved with her husband to [place] from [place].   Husband is a retired Pharmacist, community.   No children.   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   The PMH, PSH, Social History, Family History, Medications, and allergies have been reviewed in [facility], and have been updated if relevant.   Review of Systems  Constitutional: Negative.   Respiratory: Positive for cough. Negative for apnea, choking, chest tightness, shortness of breath, wheezing and stridor.   Cardiovascular: Negative.   Gastrointestinal: Negative.   Genitourinary: Negative.   Musculoskeletal: Negative.   Neurological: Negative.   Hematological: Negative.   Psychiatric/Behavioral: Negative.   All other systems reviewed and are negative.      Objective:    BP 126/78   Pulse 88   Temp 97.6 F (36.4 C)   Wt 167 lb (75.8 kg)   SpO2 97%   BMI 29.58 kg/m    Physical Exam  Constitutional: She is oriented to person, place, and time. She appears well-developed and well-nourished. No distress.  HENT:  Head: Normocephalic and atraumatic.  Cardiovascular: Normal rate and regular rhythm.   Pulmonary/Chest: Effort normal and breath sounds normal.  Musculoskeletal: Normal range of motion.  Neurological: She is alert and oriented to person, place, and time. No cranial nerve deficit.  Skin: Skin is warm and dry. She is not  diaphoretic.  Psychiatric: She has a normal mood and affect. Her behavior is normal. Judgment and thought content normal.  Nursing note and vitals reviewed.         Assessment & Plan:   Hyperlipidemia, unspecified hyperlipidemia type - Plan: Lipid panel  Cough No Follow-up on file.

## 2016-08-21 NOTE — Patient Instructions (Signed)
Great to see you.  I will call your lab results.

## 2016-08-21 NOTE — Assessment & Plan Note (Signed)
Exam reassuring. Likely allergic rhinits versus early viral illness. She has done well with tessalon in past- eRx refills sent, antihistamines suggested as well. Call or return to clinic prn if these symptoms worsen or fail to improve as anticipated. The patient indicates understanding of these issues and agrees with the plan.

## 2016-08-25 ENCOUNTER — Telehealth: Payer: Self-pay | Admitting: *Deleted

## 2016-08-25 DIAGNOSIS — N3281 Overactive bladder: Secondary | ICD-10-CM

## 2016-08-25 NOTE — Telephone Encounter (Signed)
Referral placed.

## 2016-08-25 NOTE — Telephone Encounter (Signed)
Spoke to pt who states she is wanting a referral to uro. She states she is unhappy with the results of myrbetriq, and is up 5-6x/night to use the restroom. Pt states she has spoken to  but never seen  Dr Deborra Medina regarding this matter. Pls advise

## 2016-08-26 NOTE — Telephone Encounter (Signed)
Left VM Referral has been made to Call Back with any questions or concerns

## 2016-09-06 ENCOUNTER — Other Ambulatory Visit: Payer: Self-pay | Admitting: Family Medicine

## 2016-09-08 ENCOUNTER — Other Ambulatory Visit: Payer: Self-pay | Admitting: Family Medicine

## 2016-09-09 DIAGNOSIS — L2489 Irritant contact dermatitis due to other agents: Secondary | ICD-10-CM | POA: Diagnosis not present

## 2016-09-09 DIAGNOSIS — L720 Epidermal cyst: Secondary | ICD-10-CM | POA: Diagnosis not present

## 2016-09-09 DIAGNOSIS — L853 Xerosis cutis: Secondary | ICD-10-CM | POA: Diagnosis not present

## 2016-09-09 DIAGNOSIS — L299 Pruritus, unspecified: Secondary | ICD-10-CM | POA: Diagnosis not present

## 2016-09-09 DIAGNOSIS — L219 Seborrheic dermatitis, unspecified: Secondary | ICD-10-CM | POA: Diagnosis not present

## 2016-09-22 ENCOUNTER — Encounter: Payer: Self-pay | Admitting: Family Medicine

## 2016-09-22 ENCOUNTER — Ambulatory Visit (INDEPENDENT_AMBULATORY_CARE_PROVIDER_SITE_OTHER): Payer: Medicare Other | Admitting: Family Medicine

## 2016-09-22 VITALS — BP 98/60 | HR 70 | Temp 98.3°F | Wt 158.0 lb

## 2016-09-22 DIAGNOSIS — F339 Major depressive disorder, recurrent, unspecified: Secondary | ICD-10-CM

## 2016-09-22 DIAGNOSIS — R059 Cough, unspecified: Secondary | ICD-10-CM

## 2016-09-22 DIAGNOSIS — G4700 Insomnia, unspecified: Secondary | ICD-10-CM

## 2016-09-22 DIAGNOSIS — R5383 Other fatigue: Secondary | ICD-10-CM | POA: Insufficient documentation

## 2016-09-22 DIAGNOSIS — R05 Cough: Secondary | ICD-10-CM

## 2016-09-22 MED ORDER — TRAZODONE HCL 50 MG PO TABS: 25.0000 mg | ORAL_TABLET | Freq: Every evening | ORAL | 3 refills | Status: DC | PRN

## 2016-09-22 NOTE — Progress Notes (Signed)
Pre visit review using our clinic review tool, if applicable. No additional management support is needed unless otherwise documented below in the visit note. 

## 2016-09-22 NOTE — Patient Instructions (Addendum)
Great to see you.  Please try taking a daily antihistamine like zyrtec (should be less sedating than benadryl).  Please keep me updated.   We are also adding Trazodone- 1/2 to 1 full tablet as needed at bedtime.

## 2016-09-22 NOTE — Assessment & Plan Note (Signed)
Exam reassuring and consistent with allergic rhinitis. Continue saline rinse. Add antihistamine- see AVS for details. The patient indicates understanding of these issues and agrees with the plan.

## 2016-09-22 NOTE — Progress Notes (Signed)
Subjective:   Patient ID: Jamie Matthews, female    DOB: [birth year], 76 y.o.   MRN: [id number]  Jamie Matthews is a pleasant 76 y.o. year old female who presents to clinic today with URI (cough) and Fatigue  on 09/22/2016  HPI:  Cough- months of persistent cough, clear mucous with runny nose. Using nasal saline rinse but no other rxs.  Has had some seasonal allergies in the past. No fevers, chills or SOB.  Cough seems to be worse in the morning.  She is concerned that she is more depressed.  Taking effexor and wellbutrin which have helped.  No SI or HI.  Just tired and not sleeping well.  Has less desire to go out and do things with her friends.  Lab Results  Component Value Date   WBC 11.3 (H) 04/07/2016   HGB 13.2 04/07/2016   HCT 38.9 04/07/2016   MCV 94.8 04/07/2016   PLT 298.0 04/07/2016   Lab Results  Component Value Date   VITAMINB12 640 03/17/2016   Lab Results  Component Value Date   TSH 3.71 05/12/2016     Current Outpatient Prescriptions on File Prior to Visit  Medication Sig Dispense Refill  . atorvastatin (LIPITOR) 20 MG tablet TAKE 1 TABLET BY MOUTH ONE TIME DAILY 90 tablet 1  . benzonatate (TESSALON) 100 MG capsule Take 1 capsule (100 mg total) by mouth 2 (two) times daily as needed for cough. 30 capsule 0  . BIOTIN 5000 PO Take by mouth.    [name] buPROPion (WELLBUTRIN XL) 150 MG 24 hr tablet TAKE 1 TABLET BY MOUTH DAILY 90 tablet 0  . Calcium Carbonate Antacid (ANTACID PO) Take by mouth as needed.    . desonide (DESOWEN) 0.05 % lotion     . dexlansoprazole (DEXILANT) 60 MG capsule Take 1 capsule (60 mg total) by mouth daily. 30 capsule 4  . linaclotide (LINZESS) 290 MCG CAPS capsule Take 1 capsule (290 mcg total) by mouth daily before breakfast. 30 capsule 4  . lubiprostone (AMITIZA) 24 MCG capsule TAKE 1 CAPSULE BY MOUTH TWO TIMES DAILY WITH A MEAL 60 capsule 5  . memantine (NAMENDA) 10 MG tablet TAKE ONE TABLET TWICE A DAY 180  tablet 0  . Multiple Vitamins-Minerals (CENTRUM PO) Take one by mouth daily    . MYRBETRIQ 50 MG TB24 tablet TAKE 1 TABLET BY MOUTH DAILY 90 tablet 0  . Omega-3 Fatty Acids (FISH OIL) 600 MG CAPS Take by mouth.    . polyethylene glycol (MIRALAX / GLYCOLAX) packet Take 17 g by mouth daily.    [name] Propylene Glycol (SYSTANE BALANCE OP) Apply to eye as needed.    . ranitidine (ZANTAC) 150 MG tablet Take 1 tablet (150 mg total) by mouth 2 (two) times daily. 60 tablet 4  . Sodium Chloride-Sodium Bicarb (AYR SALINE NASAL RINSE NA) Place into the nose.    . venlafaxine XR (EFFEXOR-XR) 150 MG 24 hr capsule TAKE 1 CAPSULE BY MOUTH DAILY 90 capsule 1  . diphenhydrAMINE (BENADRYL) 25 MG tablet Take 25 mg by mouth daily.    [name] MAGNESIUM PO Take by mouth.     Current Facility-Administered Medications on File Prior to Visit  Medication Dose Route Frequency Provider Last Rate Last Dose  . 0.9 %  sodium chloride infusion  500 mL Intravenous Continuous Jerene Bears, MD        Allergies  Allergen Reactions  . Sulfa Antibiotics Rash    Past Medical History:  Diagnosis  Date  . Anxiety   . Arthritis   . Belching   . Bladder disorder    OVERACTIVE  . Bowel dysfunction    BLOCKAGE  . Constipation   . Depression   . Diverticulitis   . Fibromyalgia   . GERD (gastroesophageal reflux disease)   . Hyperlipidemia   . IBS (irritable bowel syndrome)   . Internal hemorrhoids   . Memory deficits   . Pneumonia 11/18/12  . Serrated adenoma of colon   . Urinary incontinence     Past Surgical History:  Procedure Laterality Date  . BLADDER SUSPENSION  2004, 2012  . CATARACT EXTRACTION W/PHACO Right 08/27/2015   Procedure: CATARACT EXTRACTION PHACO AND INTRAOCULAR LENS PLACEMENT (IOC);  Surgeon: Estill Cotta, MD;  Location: [facility] OR[facility];  Service: Ophthalmology;  Laterality: Right;  [place]   1:00.2 AP%  22.5 CDE  23.67 fluid casette lot #[id number]  exp05/31/2018  . CATARACT EXTRACTION W/PHACO Left 10/15/2015    Procedure: CATARACT EXTRACTION PHACO AND INTRAOCULAR LENS PLACEMENT (IOC);  Surgeon: Estill Cotta, MD;  Location: [facility] OR[facility];  Service: Ophthalmology;  Laterality: Left;  [place] 01:07 AP% 18.1 CDE 21.57 fluid pack lot # [id number]  . CHOLECYSTECTOMY    . TONSILLECTOMY  1947    Family History  Problem Relation Age of Onset  . Cancer Father   . Diabetes Father   . Heart disease Father   . Heart disease Mother   . Breast cancer Sister 70  . Colon cancer Neg Hx   . Esophageal cancer Neg Hx   . Rectal cancer Neg Hx   . Stomach cancer Neg Hx     Social History   Social History  . Marital status: Married    Spouse name: N/A  . Number of children: N/A  . Years of education: N/A   Occupational History  . Retired    Social History Main Topics  . Smoking status: Never Smoker  . Smokeless tobacco: Never Used  . Alcohol use Yes     Comment: OCCAS  . Drug use: No  . Sexual activity: Not Currently   Other Topics Concern  . Not on file   Social History Narrative   Recently moved with her husband to [place] from [place].   Husband is a retired Pharmacist, community.   No children.   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   The PMH, PSH, Social History, Family History, Medications, and allergies have been reviewed in [facility], and have been updated if relevant.   Review of Systems  Constitutional: Positive for fatigue.  HENT: Positive for congestion, postnasal drip, rhinorrhea and sneezing. Negative for drooling, ear discharge, sinus pain, sinus pressure, sore throat, tinnitus, trouble swallowing and voice change.   Respiratory: Negative.   Cardiovascular: Negative.   Psychiatric/Behavioral: Positive for sleep disturbance. Negative for agitation, behavioral problems, confusion, decreased concentration, dysphoric mood, hallucinations, self-injury and suicidal ideas. The patient is not nervous/anxious and is not hyperactive.   All other systems reviewed and are negative.        Objective:    BP 98/60 (BP Location: Left Arm, Patient Position: Sitting, Cuff Size: Normal)   Pulse 70   Temp 98.3 F (36.8 C) (Oral)   Wt 158 lb (71.7 kg)   SpO2 93%   BMI 27.99 kg/m    Physical Exam  Constitutional: She is oriented to person, place, and time. She appears well-developed and well-nourished. No distress.  HENT:  Head: Normocephalic and  atraumatic.  Right Ear: Hearing and tympanic membrane normal.  Left Ear: Hearing and tympanic membrane normal.  Nose: Rhinorrhea present. Right sinus exhibits no maxillary sinus tenderness and no frontal sinus tenderness. Left sinus exhibits no maxillary sinus tenderness and no frontal sinus tenderness.  Eyes: Conjunctivae are normal.  Cardiovascular: Normal rate and regular rhythm.   Pulmonary/Chest: Effort normal and breath sounds normal.  Musculoskeletal: Normal range of motion. She exhibits no edema.  Neurological: She is alert and oriented to person, place, and time.  Skin: Skin is warm and dry. She is not diaphoretic.  Psychiatric: She has a normal mood and affect. Her behavior is normal. Judgment and thought content normal.  Nursing note and vitals reviewed.         Assessment & Plan:   Cough  Other fatigue  Insomnia, unspecified type  Depression, recurrent (HCC) No Follow-up on file.

## 2016-09-22 NOTE — Assessment & Plan Note (Signed)
Deteriorated and likely contributing to her fatigue. >25 min spent with patient, at least half of which was spent on counseling insomnia.  The problem of recurrent insomnia is discussed. Avoidance of caffeine sources is strongly encouraged. Sleep hygiene issues are reviewed.   Add Trazodone 25- 50 mg nightly as needed.  Call or return to clinic prn if these symptoms worsen or fail to improve as anticipated. The patient indicates understanding of these issues and agrees with the plan.

## 2016-09-23 DIAGNOSIS — M79674 Pain in right toe(s): Secondary | ICD-10-CM | POA: Diagnosis not present

## 2016-09-23 DIAGNOSIS — M2041 Other hammer toe(s) (acquired), right foot: Secondary | ICD-10-CM | POA: Diagnosis not present

## 2016-09-23 DIAGNOSIS — M79675 Pain in left toe(s): Secondary | ICD-10-CM | POA: Diagnosis not present

## 2016-09-23 DIAGNOSIS — B351 Tinea unguium: Secondary | ICD-10-CM | POA: Diagnosis not present

## 2016-09-25 ENCOUNTER — Encounter: Payer: Self-pay | Admitting: Urology

## 2016-09-25 ENCOUNTER — Ambulatory Visit (INDEPENDENT_AMBULATORY_CARE_PROVIDER_SITE_OTHER): Payer: Medicare Other | Admitting: Urology

## 2016-09-25 VITALS — BP 88/56 | HR 72 | Ht 63.0 in | Wt 159.2 lb

## 2016-09-25 DIAGNOSIS — R350 Frequency of micturition: Secondary | ICD-10-CM | POA: Diagnosis not present

## 2016-09-25 DIAGNOSIS — N3281 Overactive bladder: Secondary | ICD-10-CM | POA: Diagnosis not present

## 2016-09-25 LAB — URINALYSIS, COMPLETE
BILIRUBIN UA: NEGATIVE
GLUCOSE, UA: NEGATIVE
Ketones, UA: NEGATIVE
Leukocytes, UA: NEGATIVE
Nitrite, UA: NEGATIVE
PH UA: 6.5 (ref 5.0–7.5)
PROTEIN UA: NEGATIVE
RBC, UA: NEGATIVE
Specific Gravity, UA: 1.01 (ref 1.005–1.030)
UUROB: 0.2 mg/dL (ref 0.2–1.0)

## 2016-09-25 LAB — BLADDER SCAN AMB NON-IMAGING: Scan Result: 137

## 2016-09-25 LAB — MICROSCOPIC EXAMINATION: RBC MICROSCOPIC, UA: NONE SEEN /HPF (ref 0–?)

## 2016-09-25 MED ORDER — SOLIFENACIN SUCCINATE 5 MG PO TABS: 5.0000 mg | ORAL_TABLET | Freq: Every day | ORAL | 11 refills | Status: DC

## 2016-09-25 NOTE — Progress Notes (Signed)
09/25/2016 10:20 AM   Jamie Matthews 04-02-1941 [id number]  Referring provider: Lucille Passy, MD [place], [place] [address]  Chief Complaint  Patient presents with  . Over Active Bladder    HPI: The patient is a 76 year old female who presents today for evaluation of overactive bladder. This is going getting increasing worse over the last 4 years. She notes nocturia 3. She has daytime frequency voiding up to 6 times per day. She has urinary urgency with occasional urge incontinence. She finds this increasingly bothersome. She has been on Myrbetriq 50 mg for this for three years which did not help.  She does have a history of a urethral sling for stress incontinence that was eventually removed in 2012 due to urinary retention. She denies problems with stress incontinence at this point. Her major concern are her overactive bladder symptoms.  PVR: 137 cc  PMH: Past Medical History:  Diagnosis Date  . Anxiety   . Arthritis   . Belching   . Bladder disorder    OVERACTIVE  . Bowel dysfunction    BLOCKAGE  . Constipation   . Depression   . Diverticulitis   . Fibromyalgia   . GERD (gastroesophageal reflux disease)   . Hyperlipidemia   . IBS (irritable bowel syndrome)   . Internal hemorrhoids   . Memory deficits   . Pneumonia 11/18/12  . Serrated adenoma of colon   . Urinary incontinence     Surgical History: Past Surgical History:  Procedure Laterality Date  . BLADDER SUSPENSION  2004, 2012  . CATARACT EXTRACTION W/PHACO Right 08/27/2015   Procedure: CATARACT EXTRACTION PHACO AND INTRAOCULAR LENS PLACEMENT (IOC);  Surgeon: Estill Cotta, MD;  Location: [facility];  Service: Ophthalmology;  Laterality: Right;  [place]   1:00.2 AP%  22.5 CDE  23.67 fluid casette lot #[id number]  exp05/31/2018  . CATARACT EXTRACTION W/PHACO Left 10/15/2015   Procedure: CATARACT EXTRACTION PHACO AND INTRAOCULAR LENS PLACEMENT (IOC);  Surgeon: Estill Cotta, MD;   Location: [facility] OR[facility];  Service: Ophthalmology;  Laterality: Left;  [place] 01:07 AP% 18.1 CDE 21.57 fluid pack lot # [id number]  . CHOLECYSTECTOMY    . TONSILLECTOMY  1947    Home Medications:  Allergies as of 09/25/2016      Reactions   Sulfa Antibiotics Rash      Medication List       Accurate as of 09/25/16 10:20 AM. Always use your most recent med list.          ANTACID PO Take by mouth as needed.   atorvastatin 20 MG tablet Commonly known as:  LIPITOR TAKE 1 TABLET BY MOUTH ONE TIME DAILY   AYR SALINE NASAL RINSE NA Place into the nose.   benzonatate 100 MG capsule Commonly known as:  TESSALON Take 1 capsule (100 mg total) by mouth 2 (two) times daily as needed for cough.   BIOTIN 5000 PO Take by mouth.   buPROPion 150 MG 24 hr tablet Commonly known as:  WELLBUTRIN XL TAKE 1 TABLET BY MOUTH DAILY   CENTRUM PO Take one by mouth daily   desonide 0.05 % lotion Commonly known as:  DESOWEN   dexlansoprazole 60 MG capsule Commonly known as:  DEXILANT Take 1 capsule (60 mg total) by mouth daily.   diphenhydrAMINE 25 MG tablet Commonly known as:  BENADRYL Take 25 mg by mouth daily.   Fish Oil 600 MG Caps Take by mouth.   linaclotide 290 MCG Caps capsule Commonly known as:  LINZESS Take 1 capsule (290 mcg total) by mouth daily before breakfast.   lubiprostone 24 MCG capsule Commonly known as:  AMITIZA TAKE 1 CAPSULE BY MOUTH TWO TIMES DAILY WITH A MEAL   MAGNESIUM PO Take by mouth.   memantine 10 MG tablet Commonly known as:  NAMENDA TAKE ONE TABLET TWICE A DAY   MYRBETRIQ 50 MG Tb24 tablet Generic drug:  mirabegron ER TAKE 1 TABLET BY MOUTH DAILY   polyethylene glycol packet Commonly known as:  MIRALAX / GLYCOLAX Take 17 g by mouth daily.   ranitidine 150 MG tablet Commonly known as:  ZANTAC Take 1 tablet (150 mg total) by mouth 2 (two) times daily.   SYSTANE BALANCE OP Apply to eye as needed.   traZODone 50 MG tablet Commonly known as:   DESYREL Take 0.5-1 tablets (25-50 mg total) by mouth at bedtime as needed for sleep.   venlafaxine XR 150 MG 24 hr capsule Commonly known as:  EFFEXOR-XR TAKE 1 CAPSULE BY MOUTH DAILY       Allergies:  Allergies  Allergen Reactions  . Sulfa Antibiotics Rash    Family History: Family History  Problem Relation Age of Onset  . Cancer Father   . Diabetes Father   . Heart disease Father   . Heart disease Mother   . Breast cancer Sister 61  . Colon cancer Neg Hx   . Esophageal cancer Neg Hx   . Rectal cancer Neg Hx   . Stomach cancer Neg Hx   . Bladder Cancer Neg Hx   . Kidney cancer Neg Hx     Social History:  reports that she has never smoked. She has never used smokeless tobacco. She reports that she drinks alcohol. She reports that she does not use drugs.  ROS: UROLOGY Frequent Urination?: Yes Hard to postpone urination?: Yes Burning/pain with urination?: No Get up at night to urinate?: Yes Leakage of urine?: Yes Urine stream starts and stops?: No Trouble starting stream?: Yes Do you have to strain to urinate?: No Blood in urine?: No Urinary tract infection?: No Sexually transmitted disease?: No Injury to kidneys or bladder?: No Painful intercourse?: No Weak stream?: No Currently pregnant?: No Vaginal bleeding?: No Last menstrual period?: n  Gastrointestinal Nausea?: No Vomiting?: No Indigestion/heartburn?: Yes Diarrhea?: No Constipation?: Yes  Constitutional Fever: No Night sweats?: No Weight loss?: No Fatigue?: Yes  Skin Skin rash/lesions?: No Itching?: Yes  Eyes Blurred vision?: No Double vision?: No  Ears/Nose/Throat Sore throat?: No Sinus problems?: Yes  Hematologic/Lymphatic Swollen glands?: No Easy bruising?: Yes  Cardiovascular Leg swelling?: No Chest pain?: No  Respiratory Cough?: Yes Shortness of breath?: No  Endocrine Excessive thirst?: No  Musculoskeletal Back pain?: No Joint pain?:  Yes  Neurological Headaches?: No Dizziness?: No  Psychologic Depression?: Yes Anxiety?: No  Physical Exam: BP (!) 88/56 (BP Location: Left Arm, Patient Position: Sitting, Cuff Size: Normal)   Pulse 72   Ht '5\' 3"'$  (1.6 m)   Wt 159 lb 3.2 oz (72.2 kg)   BMI 28.20 kg/m   Constitutional:  Alert and oriented, No acute distress. HEENT: [place] AT, moist mucus membranes.  Trachea midline, no masses. Cardiovascular: No clubbing, cyanosis, or edema. Respiratory: Normal respiratory effort, no increased work of breathing. GI: Abdomen is soft, nontender, nondistended, no abdominal masses GU: No CVA tenderness.  Skin: No rashes, bruises or suspicious lesions. Lymph: No cervical or inguinal adenopathy. Neurologic: Grossly intact, no focal deficits, moving all 4 extremities. Psychiatric: Normal mood and affect.  Laboratory Data: Lab Results  Component Value Date   WBC 11.3 (H) 04/07/2016   HGB 13.2 04/07/2016   HCT 38.9 04/07/2016   MCV 94.8 04/07/2016   PLT 298.0 04/07/2016    Lab Results  Component Value Date   CREATININE 0.97 04/07/2016    No results found for: PSA  No results found for: TESTOSTERONE  Lab Results  Component Value Date   HGBA1C 5.6 06/12/2014    Urinalysis    Component Value Date/Time   COLORURINE YELLOW (A) 07/12/2015 1601   APPEARANCEUR CLEAR (A) 07/12/2015 1601   APPEARANCEUR Clear 06/10/2014 1759   LABSPEC 1.020 07/12/2015 1601   LABSPEC 1.027 06/10/2014 1759   PHURINE 5.0 07/12/2015 1601   GLUCOSEU NEGATIVE 07/12/2015 1601   GLUCOSEU Negative 06/10/2014 1759   HGBUR NEGATIVE 07/12/2015 1601   BILIRUBINUR NEGATIVE 07/12/2015 1601   BILIRUBINUR Negative 06/10/2014 1759   KETONESUR NEGATIVE 07/12/2015 1601   PROTEINUR NEGATIVE 07/12/2015 1601   UROBILINOGEN 0.2 12/13/2013 1224   NITRITE NEGATIVE 07/12/2015 1601   LEUKOCYTESUR 1+ (A) 07/12/2015 1601   LEUKOCYTESUR Trace 06/10/2014 1759     Assessment & Plan:    1. Overactive bladder The  patient has failed Myrbetriq therapy. We'll start her on Vesicare 5 mg daily. She was given samples of this medication, however we only had a week's worth of samples. She will call the office early next week to see if there are more samples available. She was warned of the risk of dry mouth, constipation, and altered mental status. She was warned to stop the medication if she develops any signs or symptoms of altered mental status or if her family members notice these changes. We will have her follow-up in 6-8 weeks. Prescription will be called to her pharmacy. If this medication is too expensive, we can change her to an alternative anticholinergic that is less cost prohibitive based on her insurance.   Return in about 8 weeks (around 11/20/2016).  Nickie Retort, MD  [facility] [address], [place], [place] [address] [phone]

## 2016-10-13 ENCOUNTER — Other Ambulatory Visit: Payer: Self-pay | Admitting: Family Medicine

## 2016-10-13 DIAGNOSIS — Z01419 Encounter for gynecological examination (general) (routine) without abnormal findings: Secondary | ICD-10-CM

## 2016-10-13 DIAGNOSIS — E785 Hyperlipidemia, unspecified: Secondary | ICD-10-CM

## 2016-10-14 ENCOUNTER — Ambulatory Visit (INDEPENDENT_AMBULATORY_CARE_PROVIDER_SITE_OTHER): Payer: Medicare Other

## 2016-10-14 VITALS — BP 102/60 | HR 56 | Temp 98.3°F | Ht 62.0 in | Wt 156.5 lb

## 2016-10-14 DIAGNOSIS — Z0000 Encounter for general adult medical examination without abnormal findings: Secondary | ICD-10-CM

## 2016-10-14 DIAGNOSIS — Z01419 Encounter for gynecological examination (general) (routine) without abnormal findings: Secondary | ICD-10-CM | POA: Diagnosis not present

## 2016-10-14 NOTE — Progress Notes (Signed)
Pre visit review using our clinic review tool, if applicable. No additional management support is needed unless otherwise documented below in the visit note. 

## 2016-10-14 NOTE — Patient Instructions (Signed)
[name] , Thank you for taking time to come for your Medicare Wellness Visit. I appreciate your ongoing commitment to your health goals. Please review the following plan we discussed and let me know if I can assist you in the future.   These are the goals we discussed: Goals    . Increase physical activity          Starting 10/14/2016, I will continue to walk at least 30 min daily.        This is a list of the screening recommended for you and due dates:  Health Maintenance  Topic Date Due  . Tetanus Vaccine  10/15/2026*  . Flu Shot  12/24/2016  . Mammogram  04/01/2017  . Colon Cancer Screening  02/14/2019  . DEXA scan (bone density measurement)  Completed  . Pneumonia vaccines  Completed  *Topic was postponed. The date shown is not the original due date.   Preventive Care for Adults  A healthy lifestyle and preventive care can promote health and wellness. Preventive health guidelines for adults include the following key practices.  . A routine yearly physical is a good way to check with your health care provider about your health and preventive screening. It is a chance to share any concerns and updates on your health and to receive a thorough exam.  . Visit your dentist for a routine exam and preventive care every 6 months. Brush your teeth twice a day and floss once a day. Good oral hygiene prevents tooth decay and gum disease.  . The frequency of eye exams is based on your age, health, family medical history, use  of contact lenses, and other factors. Follow your health care provider's ecommendations for frequency of eye exams.  . Eat a healthy diet. Foods like vegetables, fruits, whole grains, low-fat dairy products, and lean protein foods contain the nutrients you need without too many calories. Decrease your intake of foods high in solid fats, added sugars, and salt. Eat the right amount of calories for you. Get information about a proper diet from your health care  provider, if necessary.  . Regular physical exercise is one of the most important things you can do for your health. Most adults should get at least 150 minutes of moderate-intensity exercise (any activity that increases your heart rate and causes you to sweat) each week. In addition, most adults need muscle-strengthening exercises on 2 or more days a week.  Silver Sneakers may be a benefit available to you. To determine eligibility, you may visit the website: www.silversneakers.com or contact program at 219-095-6180 Mon-Fri between 8AM-8PM.   . Maintain a healthy weight. The body mass index (BMI) is a screening tool to identify possible weight problems. It provides an estimate of body fat based on height and weight. Your health care provider can find your BMI and can help you achieve or maintain a healthy weight.   For adults 20 years and older: ? A BMI below 18.5 is considered underweight. ? A BMI of 18.5 to 24.9 is normal. ? A BMI of 25 to 29.9 is considered overweight. ? A BMI of 30 and above is considered obese.   . Maintain normal blood lipids and cholesterol levels by exercising and minimizing your intake of saturated fat. Eat a balanced diet with plenty of fruit and vegetables. Blood tests for lipids and cholesterol should begin at age 63 and be repeated every 5 years. If your lipid or cholesterol levels are high, you are over 50,  or you are at high risk for heart disease, you may need your cholesterol levels checked more frequently. Ongoing high lipid and cholesterol levels should be treated with medicines if diet and exercise are not working.  . If you smoke, find out from your health care provider how to quit. If you do not use tobacco, please do not start.  . If you choose to drink alcohol, please do not consume more than 2 drinks per day. One drink is considered to be 12 ounces (355 mL) of beer, 5 ounces (148 mL) of wine, or 1.5 ounces (44 mL) of liquor.  . If you are 29-79 years  old, ask your health care provider if you should take aspirin to prevent strokes.  . Use sunscreen. Apply sunscreen liberally and repeatedly throughout the day. You should seek shade when your shadow is shorter than you. Protect yourself by wearing long sleeves, pants, a wide-brimmed hat, and sunglasses year round, whenever you are outdoors.  . Once a month, do a whole body skin exam, using a mirror to look at the skin on your back. Tell your health care provider of new moles, moles that have irregular borders, moles that are larger than a pencil eraser, or moles that have changed in shape or color.

## 2016-10-14 NOTE — Progress Notes (Signed)
Subjective:   Jamie Matthews is a 76 y.o. female who presents for Medicare Annual (Subsequent) preventive examination.  Review of Systems:  N/A Cardiac Risk Factors include: advanced age (>5mn, >>29women)     Objective:     Vitals: BP 102/60 (BP Location: Right Arm, Patient Position: Sitting, Cuff Size: Normal)   Pulse (!) 56   Temp 98.3 F (36.8 C) (Oral)   Ht '5\' 2"'$  (1.575 m) Comment: no shoes  Wt 156 lb 8 oz (71 kg)   SpO2 96%   BMI 28.62 kg/m   Body mass index is 28.62 kg/m.   Tobacco History  Smoking Status  . Never Smoker  Smokeless Tobacco  . Never Used     Counseling given: No   Past Medical History:  Diagnosis Date  . Anxiety   . Arthritis   . Belching   . Bladder disorder    OVERACTIVE  . Bowel dysfunction    BLOCKAGE  . Constipation   . Depression   . Diverticulitis   . Fibromyalgia   . GERD (gastroesophageal reflux disease)   . Hyperlipidemia   . IBS (irritable bowel syndrome)   . Internal hemorrhoids   . Memory deficits   . Pneumonia 11/18/12  . Serrated adenoma of colon   . Urinary incontinence    Past Surgical History:  Procedure Laterality Date  . BLADDER SUSPENSION  2004, 2012  . CATARACT EXTRACTION W/PHACO Right 08/27/2015   Procedure: CATARACT EXTRACTION PHACO AND INTRAOCULAR LENS PLACEMENT (IOC);  Surgeon: SEstill Cotta MD;  Location: [facility] ORS;  Service: Ophthalmology;  Laterality: Right;  [place]  1:00.2 AP%  22.5 CDE  23.67 fluid casette lot [id number]  exp05/31/2018  . CATARACT EXTRACTION W/PHACO Left 10/15/2015   Procedure: CATARACT EXTRACTION PHACO AND INTRAOCULAR LENS PLACEMENT (IOC);  Surgeon: SEstill Cotta MD;  Location: [facility] ORS;  Service: Ophthalmology;  Laterality: Left;  [place]07 AP% 18.1 CDE 21.57 fluid pack lot # [id number]  . CHOLECYSTECTOMY    . TONSILLECTOMY  1947   Family History  Problem Relation Age of Onset  . Cancer Father   . Diabetes Father   . Heart disease Father   . Heart  disease Mother   . Breast cancer Sister 533 . Colon cancer Neg Hx   . Esophageal cancer Neg Hx   . Rectal cancer Neg Hx   . Stomach cancer Neg Hx   . Bladder Cancer Neg Hx   . Kidney cancer Neg Hx    History  Sexual Activity  . Sexual activity: Not Currently    Outpatient Encounter Prescriptions as of 10/14/2016  Medication Sig  . atorvastatin (LIPITOR) 20 MG tablet TAKE 1 TABLET BY MOUTH ONE TIME DAILY  . benzonatate (TESSALON) 100 MG capsule Take 1 capsule (100 mg total) by mouth 2 (two) times daily as needed for cough.  .[name] KitchenBIOTIN 5000 PO Take by mouth.  .[name] KitchenbuPROPion (WELLBUTRIN XL) 150 MG 24 hr tablet TAKE 1 TABLET BY MOUTH DAILY  . Calcium Carbonate Antacid (ANTACID PO) Take by mouth as needed.  . desonide (DESOWEN) 0.05 % lotion   . dexlansoprazole (DEXILANT) 60 MG capsule Take 1 capsule (60 mg total) by mouth daily.  . diphenhydrAMINE (BENADRYL) 25 MG tablet Take 25 mg by mouth every 8 (eight) hours as needed.   . linaclotide (LINZESS) 290 MCG CAPS capsule Take 1 capsule (290 mcg total) by mouth daily before breakfast.  . memantine (NAMENDA) 10 MG tablet TAKE ONE TABLET TWICE A  DAY  . Multiple Vitamins-Minerals (CENTRUM PO) Take one by mouth daily  . MYRBETRIQ 50 MG TB24 tablet TAKE 1 TABLET BY MOUTH DAILY  . Omega-3 Fatty Acids (FISH OIL) 600 MG CAPS Take by mouth.  . polyethylene glycol (MIRALAX / GLYCOLAX) packet Take 17 g by mouth daily.  [name] Propylene Glycol (SYSTANE BALANCE OP) Apply to eye as needed.  . ranitidine (ZANTAC) 150 MG tablet Take 1 tablet (150 mg total) by mouth 2 (two) times daily.  . Sodium Chloride-Sodium Bicarb (AYR SALINE NASAL RINSE NA) Place into the nose as needed.   . solifenacin (VESICARE) 5 MG tablet Take 1 tablet (5 mg total) by mouth daily.  [name] venlafaxine XR (EFFEXOR-XR) 150 MG 24 hr capsule TAKE 1 CAPSULE BY MOUTH DAILY  . lubiprostone (AMITIZA) 24 MCG capsule TAKE 1 CAPSULE BY MOUTH TWO TIMES DAILY WITH A MEAL (Patient not taking: Reported on  10/14/2016)  . MAGNESIUM PO Take by mouth.  . traZODone (DESYREL) 50 MG tablet Take 0.5-1 tablets (25-50 mg total) by mouth at bedtime as needed for sleep. (Patient not taking: Reported on 10/14/2016)   Facility-Administered Encounter Medications as of 10/14/2016  Medication  . 0.9 %  sodium chloride infusion    Activities of Daily Living In your present state of health, do you have any difficulty performing the following activities: 10/14/2016  Hearing? N  Vision? N  Difficulty concentrating or making decisions? Y  Walking or climbing stairs? Y  Dressing or bathing? N  Doing errands, shopping? N  Preparing Food and eating ? N  Using the Toilet? N  In the past six months, have you accidently leaked urine? Y  Do you have problems with loss of bowel control? N  Managing your Medications? N  Managing your Finances? N  Housekeeping or managing your Housekeeping? N  Some recent data might be hidden    Patient Care Team: Lucille Passy, MD as PCP - General (Family Medicine) Osborne Oman, MD as Consulting Physician (Obstetrics and Gynecology) Hilarie Fredrickson, Lajuan Lines, MD as Consulting Physician (Gastroenterology)    Assessment:     Hearing Screening   '125Hz'$  '250Hz'$  '500Hz'$  '1000Hz'$  '2000Hz'$  '3000Hz'$  '4000Hz'$  '6000Hz'$  '8000Hz'$   Right ear:   40 40 40  40    Left ear:   40 40 40  0    Vision Screening Comments: Last vision exam in Feb 2018 with Dr. Sandra Cockayne  Exercise Activities and Dietary recommendations Current Exercise Habits: Home exercise routine, Type of exercise: walking, Time (Minutes): 30, Frequency (Times/Week): 7, Weekly Exercise (Minutes/Week): 210, Intensity: Mild, Exercise limited by: None identified  Goals    . Increase physical activity          Starting 10/14/2016, I will continue to walk at least 30 min daily.       Fall Risk Fall Risk  10/14/2016 07/18/2015 12/13/2013 11/15/2012  Falls in the past year? Yes No Yes Yes  Number falls in past yr: 2 or more - 1 1  Injury with Fall? No -  No Yes   Depression Screen PHQ 2/9 Scores 10/14/2016 07/18/2015 12/13/2013 11/15/2012  PHQ - 2 Score '1 1 1 '$ 0     Cognitive Function MMSE - Mini Mental State Exam 10/14/2016  Orientation to time 5  Orientation to Place 5  Registration 3  Attention/ Calculation 0  Recall 2  Recall-comments pt was unable to recall 1 of 3 words  Language- name 2 objects 0  Language- repeat 1  Language- follow 3 step  command 3  Language- read & follow direction 0  Write a sentence 0  Copy design 0  Total score 19     PLEASE NOTE: A Mini-Cog screen was completed. Maximum score is 20. A value of 0 denotes this part of Folstein MMSE was not completed or the patient failed this part of the Mini-Cog screening.   Mini-Cog Screening Orientation to Time - Max 5 pts Orientation to Place - Max 5 pts Registration - Max 3 pts Recall - Max 3 pts Language Repeat - Max 1 pts Language Follow 3 Step Command - Max 3 pts     Immunization History  Administered Date(s) Administered  . Influenza Split 02/05/2012  . Influenza,inj,Quad PF,36+ Mos 02/15/2015  . Influenza-Unspecified 02/23/2013  . Pneumococcal Conjugate-13 12/13/2013  . Pneumococcal Polysaccharide-23 07/18/2015   Screening Tests Health Maintenance  Topic Date Due  . TETANUS/TDAP  10/15/2026 (Originally 09/01/1959)  . INFLUENZA VACCINE  12/24/2016  . MAMMOGRAM  04/01/2017  . COLONOSCOPY  02/14/2019  . DEXA SCAN  Completed  . PNA vac Low Risk Adult  Completed      Plan:     I have personally reviewed and addressed the Medicare Annual Wellness questionnaire and have noted the following in the patient's chart:  A. Medical and social history B. Use of alcohol, tobacco or illicit drugs  C. Current medications and supplements D. Functional ability and status E.  Nutritional status F.  Physical activity G. Advance directives H. List of other physicians I.  Hospitalizations, surgeries, and ER visits in previous 12 months J.  [place]  to include hearing, vision, cognitive, depression L. Referrals and appointments - none  In addition, I have reviewed and discussed with patient certain preventive protocols, quality metrics, and best practice recommendations. A written personalized care plan for preventive services as well as general preventive health recommendations were provided to patient.  See attached scanned questionnaire for additional information.   Signed,   Lindell Noe, MHA, BS, LPN Health Coach

## 2016-10-14 NOTE — Progress Notes (Signed)
PCP notes:   Health maintenance:  Tetanus - postponed/insurance  Abnormal screenings:   Hearing - failed Fall risk - hx of fall without injury and no medical treatment Depression score: 1 Mini-Cog score: 19/20  Patient concerns:   None  Nurse concerns:  None  Next PCP appt:   10/28/16 @ 0830  I reviewed health advisor's note, was available for consultation, and agree with documentation and plan. Loura Pardon MD

## 2016-10-15 LAB — CBC WITH DIFFERENTIAL/PLATELET
Basophils Absolute: 0.1 10*3/uL (ref 0.0–0.1)
Basophils Relative: 0.9 % (ref 0.0–3.0)
EOS PCT: 8.1 % — AB (ref 0.0–5.0)
Eosinophils Absolute: 0.6 10*3/uL (ref 0.0–0.7)
HCT: 35.8 % — ABNORMAL LOW (ref 36.0–46.0)
Hemoglobin: 12.3 g/dL (ref 12.0–15.0)
LYMPHS PCT: 13.3 % (ref 12.0–46.0)
Lymphs Abs: 0.9 10*3/uL (ref 0.7–4.0)
MCHC: 34.3 g/dL (ref 30.0–36.0)
MCV: 96.1 fl (ref 78.0–100.0)
MONO ABS: 0.8 10*3/uL (ref 0.1–1.0)
MONOS PCT: 11.2 % (ref 3.0–12.0)
NEUTROS PCT: 66.5 % (ref 43.0–77.0)
Neutro Abs: 4.6 10*3/uL (ref 1.4–7.7)
PLATELETS: 256 10*3/uL (ref 150.0–400.0)
RBC: 3.72 Mil/uL — AB (ref 3.87–5.11)
RDW: 13.4 % (ref 11.5–15.5)
WBC: 6.9 10*3/uL (ref 4.0–10.5)

## 2016-10-15 LAB — LIPID PANEL
CHOL/HDL RATIO: 4
Cholesterol: 164 mg/dL (ref 0–200)
HDL: 46.4 mg/dL (ref >39.00)
LDL CALC: 100 mg/dL — AB (ref 0–99)
NonHDL: 117.17
TRIGLYCERIDES: 88 mg/dL (ref 0.0–149.0)
VLDL: 17.6 mg/dL (ref 0.0–40.0)

## 2016-10-15 LAB — COMPREHENSIVE METABOLIC PANEL
ALT: 17 U/L (ref 0–35)
AST: 17 U/L (ref 0–37)
Albumin: 4.4 g/dL (ref 3.5–5.2)
Alkaline Phosphatase: 70 U/L (ref 39–117)
BUN: 25 mg/dL — ABNORMAL HIGH (ref 6–23)
CHLORIDE: 104 meq/L (ref 96–112)
CO2: 27 meq/L (ref 19–32)
CREATININE: 1 mg/dL (ref 0.40–1.20)
Calcium: 9.3 mg/dL (ref 8.4–10.5)
GFR: 56.98 mL/min — ABNORMAL LOW (ref >60.00)
Glucose, Bld: 89 mg/dL (ref 70–99)
POTASSIUM: 4.1 meq/L (ref 3.5–5.1)
SODIUM: 139 meq/L (ref 135–145)
Total Bilirubin: 0.4 mg/dL (ref 0.2–1.2)
Total Protein: 6.7 g/dL (ref 6.0–8.3)

## 2016-10-15 LAB — TSH: TSH: 2.55 u[IU]/mL (ref 0.35–4.50)

## 2016-10-21 DIAGNOSIS — Z961 Presence of intraocular lens: Secondary | ICD-10-CM | POA: Diagnosis not present

## 2016-10-24 DIAGNOSIS — L720 Epidermal cyst: Secondary | ICD-10-CM | POA: Diagnosis not present

## 2016-10-24 DIAGNOSIS — L249 Irritant contact dermatitis, unspecified cause: Secondary | ICD-10-CM | POA: Diagnosis not present

## 2016-10-24 DIAGNOSIS — L219 Seborrheic dermatitis, unspecified: Secondary | ICD-10-CM | POA: Diagnosis not present

## 2016-10-24 DIAGNOSIS — L820 Inflamed seborrheic keratosis: Secondary | ICD-10-CM | POA: Diagnosis not present

## 2016-10-28 ENCOUNTER — Ambulatory Visit (INDEPENDENT_AMBULATORY_CARE_PROVIDER_SITE_OTHER): Payer: Medicare Other | Admitting: Family Medicine

## 2016-10-28 ENCOUNTER — Encounter: Payer: Self-pay | Admitting: Family Medicine

## 2016-10-28 VITALS — BP 110/68 | HR 70 | Ht 62.0 in | Wt 159.8 lb

## 2016-10-28 DIAGNOSIS — Z66 Do not resuscitate: Secondary | ICD-10-CM | POA: Diagnosis not present

## 2016-10-28 DIAGNOSIS — E785 Hyperlipidemia, unspecified: Secondary | ICD-10-CM

## 2016-10-28 DIAGNOSIS — R413 Other amnesia: Secondary | ICD-10-CM | POA: Diagnosis not present

## 2016-10-28 DIAGNOSIS — F339 Major depressive disorder, recurrent, unspecified: Secondary | ICD-10-CM

## 2016-10-28 DIAGNOSIS — K219 Gastro-esophageal reflux disease without esophagitis: Secondary | ICD-10-CM

## 2016-10-28 DIAGNOSIS — F5104 Psychophysiologic insomnia: Secondary | ICD-10-CM

## 2016-10-28 DIAGNOSIS — K5909 Other constipation: Secondary | ICD-10-CM

## 2016-10-28 DIAGNOSIS — G4700 Insomnia, unspecified: Secondary | ICD-10-CM | POA: Diagnosis not present

## 2016-10-28 DIAGNOSIS — N3946 Mixed incontinence: Secondary | ICD-10-CM

## 2016-10-28 NOTE — Patient Instructions (Signed)
Great to see you! Keep up the great work!!

## 2016-10-28 NOTE — Assessment & Plan Note (Signed)
Well controlled on current rxs. No changes made today. 

## 2016-10-28 NOTE — Progress Notes (Signed)
Pre visit review using our clinic review tool, if applicable. No additional management support is needed unless otherwise documented below in the visit note. 

## 2016-10-28 NOTE — Progress Notes (Signed)
Subjective:   Patient ID: Jamie Matthews, female    DOB: 1940/10/22, 76 y.o.   MRN: [id number]  Jamie Matthews is a pleasant 76 y.o. year old female who presents to clinic today with Follow-up  on 10/28/2016  HPI:  Medicare annual wellness visit with Candis Musa, RN on 10/14/16. Note reviewed.  Saw urologist on 09/25/16, Dr. Pilar Jarvis for OAB. Note reviewed. Myrbetriq was d/c'd Started on Vesicare 5 mg daily.  Advised follow up in 6- 8 weeks. Vesicare seems to be helping.  Going to weight watchers and has lost 10 pounds!  HLD- taking lipitor 20 mg daily.  Lab Results  Component Value Date   CHOL 164 10/14/2016   HDL 46.40 10/14/2016   LDLCALC 100 (H) 10/14/2016   LDLDIRECT 175.9 02/05/2012   TRIG 88.0 10/14/2016   CHOLHDL 4 10/14/2016   Lab Results  Component Value Date   ALT 17 10/14/2016   AST 17 10/14/2016   ALKPHOS 70 10/14/2016   BILITOT 0.4 10/14/2016     Depression- chronic issue and currently symptoms are well controlled with her current medications. Trazodone is helping with insomnia.  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  Per pt, could not tolerate other medications.  Current Outpatient Prescriptions on File Prior to Visit  Medication Sig Dispense Refill  . atorvastatin (LIPITOR) 20 MG tablet TAKE 1 TABLET BY MOUTH ONE TIME DAILY 90 tablet 1  . benzonatate (TESSALON) 100 MG capsule Take 1 capsule (100 mg total) by mouth 2 (two) times daily as needed for cough. 30 capsule 0  . BIOTIN 5000 PO Take by mouth.    [name] buPROPion (WELLBUTRIN XL) 150 MG 24 hr tablet TAKE 1 TABLET BY MOUTH DAILY 90 tablet 0  . Calcium Carbonate Antacid (ANTACID PO) Take by mouth as needed.    . desonide (DESOWEN) 0.05 % lotion     . dexlansoprazole (DEXILANT) 60 MG capsule Take 1 capsule (60 mg total) by mouth daily. 30 capsule 4  . diphenhydrAMINE (BENADRYL) 25 MG tablet Take 25 mg by mouth every 8 (eight) hours as needed.     . linaclotide (LINZESS) 290 MCG CAPS  capsule Take 1 capsule (290 mcg total) by mouth daily before breakfast. 30 capsule 4  . lubiprostone (AMITIZA) 24 MCG capsule TAKE 1 CAPSULE BY MOUTH TWO TIMES DAILY WITH A MEAL 60 capsule 5  . MAGNESIUM PO Take by mouth.    . memantine (NAMENDA) 10 MG tablet TAKE ONE TABLET TWICE A DAY 180 tablet 0  . Multiple Vitamins-Minerals (CENTRUM PO) Take one by mouth daily    . Omega-3 Fatty Acids (FISH OIL) 600 MG CAPS Take by mouth.    . polyethylene glycol (MIRALAX / GLYCOLAX) packet Take 17 g by mouth daily.    [name] Propylene Glycol (SYSTANE BALANCE OP) Apply to eye as needed.    . ranitidine (ZANTAC) 150 MG tablet Take 1 tablet (150 mg total) by mouth 2 (two) times daily. 60 tablet 4  . Sodium Chloride-Sodium Bicarb (AYR SALINE NASAL RINSE NA) Place into the nose as needed.     . solifenacin (VESICARE) 5 MG tablet Take 1 tablet (5 mg total) by mouth daily. 30 tablet 11  . traZODone (DESYREL) 50 MG tablet Take 0.5-1 tablets (25-50 mg total) by mouth at bedtime as needed for sleep. 30 tablet 3  . venlafaxine XR (EFFEXOR-XR) 150 MG 24 hr capsule TAKE 1 CAPSULE BY MOUTH DAILY 90 capsule 1   Current Facility-Administered Medications on File Prior to Visit  Medication Dose Route Frequency Provider Last Rate Last Dose  . 0.9 %  sodium chloride infusion  500 mL Intravenous Continuous Pyrtle, Lajuan Lines, MD        Allergies  Allergen Reactions  . Sulfa Antibiotics Rash    Past Medical History:  Diagnosis Date  . Anxiety   . Arthritis   . Belching   . Bladder disorder    OVERACTIVE  . Bowel dysfunction    BLOCKAGE  . Constipation   . Depression   . Diverticulitis   . Fibromyalgia   . GERD (gastroesophageal reflux disease)   . Hyperlipidemia   . IBS (irritable bowel syndrome)   . Internal hemorrhoids   . Memory deficits   . Pneumonia 11/18/12  . Serrated adenoma of colon   . Urinary incontinence     Past Surgical History:  Procedure Laterality Date  . BLADDER SUSPENSION  2004, 2012  .  CATARACT EXTRACTION W/PHACO Right 08/27/2015   Procedure: CATARACT EXTRACTION PHACO AND INTRAOCULAR LENS PLACEMENT (IOC);  Surgeon: Estill Cotta, MD;  Location: [facility] ORS;  Service: Ophthalmology;  Laterality: Right;  [place]   1:00.2 AP%  22.5 CDE  23.67 fluid casette lot #[id number]  exp05/31/2018  . CATARACT EXTRACTION W/PHACO Left 10/15/2015   Procedure: CATARACT EXTRACTION PHACO AND INTRAOCULAR LENS PLACEMENT (IOC);  Surgeon: Estill Cotta, MD;  Location: [facility] ORS;  Service: Ophthalmology;  Laterality: Left;  [place] 01:07 AP% 18.1 CDE 21.57 fluid pack lot # [id number]  . CHOLECYSTECTOMY    . TONSILLECTOMY  1947    Family History  Problem Relation Age of Onset  . Cancer Father   . Diabetes Father   . Heart disease Father   . Heart disease Mother   . Breast cancer Sister 27  . Colon cancer Neg Hx   . Esophageal cancer Neg Hx   . Rectal cancer Neg Hx   . Stomach cancer Neg Hx   . Bladder Cancer Neg Hx   . Kidney cancer Neg Hx     Social History   Social History  . Marital status: Married    Spouse name: N/A  . Number of children: N/A  . Years of education: N/A   Occupational History  . Retired    Social History Main Topics  . Smoking status: Never Smoker  . Smokeless tobacco: Never Used  . Alcohol use Yes     Comment: OCCAS  . Drug use: No  . Sexual activity: Not Currently   Other Topics Concern  . Not on file   Social History Narrative   Recently moved with her husband to [place] from [place].   Husband is a retired Pharmacist, community.   No children.   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   The PMH, PSH, Social History, Family History, Medications, and allergies have been reviewed in [facility], and have been updated if relevant.   Review of Systems  Constitutional: Positive for fatigue. Negative for fever.  HENT: Negative.   Eyes: Negative.   Respiratory: Negative.   Cardiovascular: Negative.   Gastrointestinal: Negative.   Endocrine: Negative.     Genitourinary: Negative.   Musculoskeletal: Positive for arthralgias.  Allergic/Immunologic: Negative.   Neurological: Negative.   Hematological: Negative.   Psychiatric/Behavioral: Negative.   All other systems reviewed and are negative.      Objective:    BP 110/68   Pulse 70   Ht 5\' 2"  (1.575 m)   Wt 159 lb 12  oz (72.5 kg)   SpO2 99%   BMI 29.22 kg/m   Wt Readings from Last 3 Encounters:  10/28/16 159 lb 12 oz (72.5 kg)  10/14/16 156 lb 8 oz (71 kg)  09/25/16 159 lb 3.2 oz (72.2 kg)    Physical Exam   General:  Well-developed,well-nourished,in no acute distress; alert,appropriate and cooperative throughout examination Head:  normocephalic and atraumatic.   Eyes:  vision grossly intact, PERRL Ears:  R ear normal and L ear normal externally, TMs clear bilaterally Nose:  no external deformity.   Mouth:  good dentition.   Neck:  No deformities, masses, or tenderness noted. Lungs:  Normal respiratory effort, chest expands symmetrically. Lungs are clear to auscultation, no crackles or wheezes. Heart:  Normal rate and regular rhythm. S1 and S2 normal without gallop, murmur, click, rub or other extra sounds. Abdomen:  Bowel sounds positive,abdomen soft and non-tender without masses, organomegaly or hernias noted. Msk:  No deformity or scoliosis noted of thoracic or lumbar spine.   Extremities:  No clubbing, cyanosis, edema, or deformity noted with normal full range of motion of all joints.   Neurologic:  alert & oriented X3 and gait normal.   Skin:  Intact without suspicious lesions or rashes Psych:  Cognition and judgment appear intact. Alert and cooperative with normal attention span and concentration. No apparent delusions, illusions, hallucinations       Assessment & Plan:   Chronic constipation  Chronic insomnia  Depression, recurrent (HCC)  DNR (do not resuscitate)  Gastroesophageal reflux disease, esophagitis presence not specified  Hyperlipidemia,  unspecified hyperlipidemia type  Mixed stress and urge urinary incontinence No Follow-up on file.

## 2016-10-28 NOTE — Assessment & Plan Note (Signed)
Improved with vesicare.

## 2016-10-28 NOTE — Assessment & Plan Note (Signed)
Well controlled on current rx. No changes made today. 

## 2016-10-28 NOTE — Assessment & Plan Note (Signed)
MMSE was great. No changes made. Continue namenda- mild dementia.

## 2016-11-10 ENCOUNTER — Telehealth: Payer: Self-pay | Admitting: Internal Medicine

## 2016-11-10 NOTE — Telephone Encounter (Signed)
Spoke with pt and she states she scheduled an appt with Dr. Hilarie Fredrickson for 01/07/17 but she cannot remember the appt time. Discussed with pt that she is scheduled for a 9am appt that day. Pt aware.

## 2016-11-11 ENCOUNTER — Ambulatory Visit (INDEPENDENT_AMBULATORY_CARE_PROVIDER_SITE_OTHER): Payer: Medicare Other | Admitting: Family Medicine

## 2016-11-11 ENCOUNTER — Encounter: Payer: Self-pay | Admitting: Family Medicine

## 2016-11-11 VITALS — BP 120/80 | HR 58 | Ht 62.0 in | Wt 153.0 lb

## 2016-11-11 DIAGNOSIS — K5909 Other constipation: Secondary | ICD-10-CM | POA: Diagnosis not present

## 2016-11-11 DIAGNOSIS — K219 Gastro-esophageal reflux disease without esophagitis: Secondary | ICD-10-CM | POA: Diagnosis not present

## 2016-11-11 NOTE — Assessment & Plan Note (Signed)
Deteriorated because she got off her normal bowel regimen. Advised restarting this- linzess, daily prunes, miralax. Consider enema x 1 if no results as expected over next few days. Follow up with GI or myself as needed. The patient indicates understanding of these issues and agrees with the plan.

## 2016-11-11 NOTE — Progress Notes (Signed)
Pre visit review using our clinic review tool, if applicable. No additional management support is needed unless otherwise documented below in the visit note. 

## 2016-11-11 NOTE — Progress Notes (Signed)
Subjective:   Patient ID: Jamie Matthews, female    DOB: 1940/06/14, 76 y.o.   MRN: [id number]  Jamie Matthews is a pleasant 76 y.o. year old female who presents to clinic today with Gastroesophageal Reflux; Abdominal Cramping; and Constipation (hasn't had a BM since Saturday. )  on 11/11/2016  HPI:   GERD and constipation- has been followed by Dr. Hilarie Fredrickson for both.  Deteriorated but could not get into see GI so she is here today.  Has not had a BM in 3 days and she is concerned.  Stopped linzess- on Sunday,  does not like how it makes her feel.    She is taking Miralax and amitiza now.  Feels bloated and fatigued.  On weight watchers- losing weight intentionally.  Current Outpatient Prescriptions on File Prior to Visit  Medication Sig Dispense Refill  . atorvastatin (LIPITOR) 20 MG tablet TAKE 1 TABLET BY MOUTH ONE TIME DAILY 90 tablet 1  . BIOTIN 5000 PO Take by mouth.    [name] buPROPion (WELLBUTRIN XL) 150 MG 24 hr tablet TAKE 1 TABLET BY MOUTH DAILY 90 tablet 0  . Calcium Carbonate Antacid (ANTACID PO) Take by mouth as needed.    . desonide (DESOWEN) 0.05 % lotion     . dexlansoprazole (DEXILANT) 60 MG capsule Take 1 capsule (60 mg total) by mouth daily. 30 capsule 4  . diphenhydrAMINE (BENADRYL) 25 MG tablet Take 25 mg by mouth every 8 (eight) hours as needed.     . linaclotide (LINZESS) 290 MCG CAPS capsule Take 1 capsule (290 mcg total) by mouth daily before breakfast. 30 capsule 4  . lubiprostone (AMITIZA) 24 MCG capsule TAKE 1 CAPSULE BY MOUTH TWO TIMES DAILY WITH A MEAL 60 capsule 5  . MAGNESIUM PO Take by mouth.    . memantine (NAMENDA) 10 MG tablet TAKE ONE TABLET TWICE A DAY 180 tablet 0  . Multiple Vitamins-Minerals (CENTRUM PO) Take one by mouth daily    . Omega-3 Fatty Acids (FISH OIL) 600 MG CAPS Take by mouth.    . polyethylene glycol (MIRALAX / GLYCOLAX) packet Take 17 g by mouth daily.    [name] Propylene Glycol (SYSTANE BALANCE OP) Apply  to eye as needed.    . ranitidine (ZANTAC) 150 MG tablet Take 1 tablet (150 mg total) by mouth 2 (two) times daily. 60 tablet 4  . Sodium Chloride-Sodium Bicarb (AYR SALINE NASAL RINSE NA) Place into the nose as needed.     . solifenacin (VESICARE) 5 MG tablet Take 1 tablet (5 mg total) by mouth daily. 30 tablet 11  . traZODone (DESYREL) 50 MG tablet Take 0.5-1 tablets (25-50 mg total) by mouth at bedtime as needed for sleep. 30 tablet 3  . venlafaxine XR (EFFEXOR-XR) 150 MG 24 hr capsule TAKE 1 CAPSULE BY MOUTH DAILY 90 capsule 1   Current Facility-Administered Medications on File Prior to Visit  Medication Dose Route Frequency Provider Last Rate Last Dose  . 0.9 %  sodium chloride infusion  500 mL Intravenous Continuous Pyrtle, Lajuan Lines, MD        Allergies  Allergen Reactions  . Sulfa Antibiotics Rash    Past Medical History:  Diagnosis Date  . Anxiety   . Arthritis   . Belching   . Bladder disorder    OVERACTIVE  . Bowel dysfunction    BLOCKAGE  . Constipation   . Depression   . Diverticulitis   . Fibromyalgia   . GERD (gastroesophageal reflux disease)   .  Hyperlipidemia   . IBS (irritable bowel syndrome)   . Internal hemorrhoids   . Memory deficits   . Pneumonia 11/18/12  . Serrated adenoma of colon   . Urinary incontinence     Past Surgical History:  Procedure Laterality Date  . BLADDER SUSPENSION  2004, 2012  . CATARACT EXTRACTION W/PHACO Right 08/27/2015   Procedure: CATARACT EXTRACTION PHACO AND INTRAOCULAR LENS PLACEMENT (IOC);  Surgeon: Estill Cotta, MD;  Location: [facility] ORS;  Service: Ophthalmology;  Laterality: Right;  [place]   1:00.2 AP%  22.5 CDE  23.67 fluid casette lot #[id number]  exp05/31/2018  . CATARACT EXTRACTION W/PHACO Left 10/15/2015   Procedure: CATARACT EXTRACTION PHACO AND INTRAOCULAR LENS PLACEMENT (IOC);  Surgeon: Estill Cotta, MD;  Location: [facility] ORS;  Service: Ophthalmology;  Laterality: Left;  [place] 01:07 AP% 18.1 CDE 21.57 fluid pack lot  # [id number]  . CHOLECYSTECTOMY    . TONSILLECTOMY  1947    Family History  Problem Relation Age of Onset  . Cancer Father   . Diabetes Father   . Heart disease Father   . Heart disease Mother   . Breast cancer Sister 84  . Colon cancer Neg Hx   . Esophageal cancer Neg Hx   . Rectal cancer Neg Hx   . Stomach cancer Neg Hx   . Bladder Cancer Neg Hx   . Kidney cancer Neg Hx     Social History   Social History  . Marital status: Married    Spouse name: N/A  . Number of children: N/A  . Years of education: N/A   Occupational History  . Retired    Social History Main Topics  . Smoking status: Never Smoker  . Smokeless tobacco: Never Used  . Alcohol use Yes     Comment: OCCAS  . Drug use: No  . Sexual activity: Not Currently   Other Topics Concern  . Not on file   Social History Narrative   Recently moved with her husband to [place] from [place].   Husband is a retired Pharmacist, community.   No children.   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   The PMH, PSH, Social History, Family History, Medications, and allergies have been reviewed in [facility], and have been updated if relevant.   Review of Systems  Constitutional: Positive for fatigue. Negative for fever.  Gastrointestinal: Positive for abdominal distention, abdominal pain and constipation. Negative for anal bleeding, blood in stool, diarrhea, nausea, rectal pain and vomiting.       Objective:    BP 120/80   Pulse (!) 58   Ht 5\' 2"  (1.575 m)   Wt 153 lb (69.4 kg)   SpO2 99%   BMI 27.98 kg/m   Wt Readings from Last 3 Encounters:  11/11/16 153 lb (69.4 kg)  10/28/16 159 lb 12 oz (72.5 kg)  10/14/16 156 lb 8 oz (71 kg)     Physical Exam  Constitutional: She is oriented to person, place, and time. She appears well-developed and well-nourished. No distress.  HENT:  Head: Normocephalic and atraumatic.  Eyes: Conjunctivae are normal.  Cardiovascular: Normal rate.   Pulmonary/Chest: Effort normal.    Abdominal: Soft. Bowel sounds are normal. She exhibits no distension and no mass. There is no tenderness. There is no rebound and no guarding.  Musculoskeletal: Normal range of motion.  Neurological: She is alert and oriented to person, place, and time. No cranial nerve deficit.  Skin: Skin is warm  and dry. She is not diaphoretic.  Psychiatric: She has a normal mood and affect. Her behavior is normal. Judgment and thought content normal.  Nursing note and vitals reviewed.         Assessment & Plan:   Chronic constipation  Gastroesophageal reflux disease, esophagitis presence not specified No Follow-up on file.

## 2016-11-11 NOTE — Patient Instructions (Signed)
Great to see you.  Restart your daily prunes, miralax and amtiza.  Consider enema if no result.

## 2016-11-21 ENCOUNTER — Ambulatory Visit (INDEPENDENT_AMBULATORY_CARE_PROVIDER_SITE_OTHER): Payer: Medicare Other | Admitting: Urology

## 2016-11-21 ENCOUNTER — Encounter: Payer: Self-pay | Admitting: Urology

## 2016-11-21 VITALS — BP 110/63 | HR 60 | Ht 62.0 in | Wt 155.9 lb

## 2016-11-21 DIAGNOSIS — N3281 Overactive bladder: Secondary | ICD-10-CM | POA: Diagnosis not present

## 2016-11-21 LAB — BLADDER SCAN AMB NON-IMAGING: Scan Result: 167

## 2016-11-21 NOTE — Progress Notes (Signed)
11/21/2016 2:27 PM   Jamie Matthews [birth year] [id number]  Referring provider: Lucille Passy, MD [place], [place] [address]  Chief Complaint  Patient presents with  . Over Active Bladder    HPI: The patient is a 76 year old female who presents today for evaluation of overactive bladder. This is going getting increasing worse over the last 4 years. She notes nocturia 3. She has daytime frequency voiding up to 6 times per day. She has urinary urgency with occasional urge incontinence. She finds this increasingly bothersome. She has been on Myrbetriq 50 mg for this for three years which did not help.  She does have a history of a urethral sling for stress incontinence that was eventually removed in 2012 due to urinary retention. She denies problems with stress incontinence at this point. Her major concern are her overactive bladder symptoms.  PVR was 137 cc at last visit.  She was started on Vesicare 5 mg at her last visit. She returns today to assess her progress. She reported 25-50% improvement in her symptoms. She still has nocturia 3-4. She has residual urgency but is not as bad with less urge incontinence. She is overall happy with this medication. She has not noticed any significant changes in constipation or dry mouth.   PMH: Past Medical History:  Diagnosis Date  . Anxiety   . Arthritis   . Belching   . Bladder disorder    OVERACTIVE  . Bowel dysfunction    BLOCKAGE  . Constipation   . Depression   . Diverticulitis   . Fibromyalgia   . GERD (gastroesophageal reflux disease)   . Hyperlipidemia   . IBS (irritable bowel syndrome)   . Internal hemorrhoids   . Memory deficits   . Pneumonia 11/18/12  . Serrated adenoma of colon   . Urinary incontinence     Surgical History: Past Surgical History:  Procedure Laterality Date  . BLADDER SUSPENSION  2004, 2012  . CATARACT EXTRACTION W/PHACO Right 08/27/2015   Procedure: CATARACT EXTRACTION PHACO  AND INTRAOCULAR LENS PLACEMENT (IOC);  Surgeon: Estill Cotta, MD;  Location: [facility] ORS;  Service: Ophthalmology;  Laterality: Right;  [place]   1:00.2 AP%  22.5 CDE  23.67 fluid casette lot #[id number]  exp05/31/2018  . CATARACT EXTRACTION W/PHACO Left 10/15/2015   Procedure: CATARACT EXTRACTION PHACO AND INTRAOCULAR LENS PLACEMENT (IOC);  Surgeon: Estill Cotta, MD;  Location: [facility] ORS;  Service: Ophthalmology;  Laterality: Left;  [place] 01:07 AP% 18.1 CDE 21.57 fluid pack lot # [id number]  . CHOLECYSTECTOMY    . TONSILLECTOMY  1947    Home Medications:  Allergies as of 11/21/2016      Reactions   Sulfa Antibiotics Rash      Medication List       Accurate as of 11/21/16  2:27 PM. Always use your most recent med list.          ANTACID PO Take by mouth as needed.   atorvastatin 20 MG tablet Commonly known as:  LIPITOR TAKE 1 TABLET BY MOUTH ONE TIME DAILY   AYR SALINE NASAL RINSE NA Place into the nose as needed.   BIOTIN 5000 PO Take by mouth.   buPROPion 150 MG 24 hr tablet Commonly known as:  WELLBUTRIN XL TAKE 1 TABLET BY MOUTH DAILY   CENTRUM PO Take one by mouth daily   desonide 0.05 % lotion Commonly known as:  DESOWEN   dexlansoprazole 60 MG capsule Commonly known as:  DEXILANT Take 1  capsule (60 mg total) by mouth daily.   diphenhydrAMINE 25 MG tablet Commonly known as:  BENADRYL Take 25 mg by mouth every 8 (eight) hours as needed.   Fish Oil 600 MG Caps Take by mouth.   linaclotide 290 MCG Caps capsule Commonly known as:  LINZESS Take 1 capsule (290 mcg total) by mouth daily before breakfast.   lubiprostone 24 MCG capsule Commonly known as:  AMITIZA TAKE 1 CAPSULE BY MOUTH TWO TIMES DAILY WITH A MEAL   MAGNESIUM PO Take by mouth.   memantine 10 MG tablet Commonly known as:  NAMENDA TAKE ONE TABLET TWICE A DAY   polyethylene glycol packet Commonly known as:  MIRALAX / GLYCOLAX Take 17 g by mouth daily.   ranitidine 150 MG  tablet Commonly known as:  ZANTAC Take 1 tablet (150 mg total) by mouth 2 (two) times daily.   solifenacin 5 MG tablet Commonly known as:  VESICARE Take 1 tablet (5 mg total) by mouth daily.   SYSTANE BALANCE OP Apply to eye as needed.   traZODone 50 MG tablet Commonly known as:  DESYREL Take 0.5-1 tablets (25-50 mg total) by mouth at bedtime as needed for sleep.   venlafaxine XR 150 MG 24 hr capsule Commonly known as:  EFFEXOR-XR TAKE 1 CAPSULE BY MOUTH DAILY       Allergies:  Allergies  Allergen Reactions  . Sulfa Antibiotics Rash    Family History: Family History  Problem Relation Age of Onset  . Cancer Father   . Diabetes Father   . Heart disease Father   . Heart disease Mother   . Breast cancer Sister 47  . Colon cancer Neg Hx   . Esophageal cancer Neg Hx   . Rectal cancer Neg Hx   . Stomach cancer Neg Hx   . Bladder Cancer Neg Hx   . Kidney cancer Neg Hx     Social History:  reports that she has never smoked. She has never used smokeless tobacco. She reports that she drinks alcohol. She reports that she does not use drugs.  ROS: UROLOGY Frequent Urination?: Yes Hard to postpone urination?: Yes Burning/pain with urination?: No Get up at night to urinate?: Yes Leakage of urine?: No Urine stream starts and stops?: No Trouble starting stream?: No Do you have to strain to urinate?: No Blood in urine?: No Urinary tract infection?: No Sexually transmitted disease?: No Injury to kidneys or bladder?: No Painful intercourse?: No Weak stream?: No Currently pregnant?: No Vaginal bleeding?: No Last menstrual period?: n  Gastrointestinal Nausea?: No Vomiting?: No Indigestion/heartburn?: No Diarrhea?: No Constipation?: No  Constitutional Fever: No Night sweats?: No Weight loss?: No Fatigue?: No  Skin Skin rash/lesions?: No Itching?: No  Eyes Blurred vision?: No Double vision?: No  Ears/Nose/Throat Sore throat?: No Sinus problems?:  No  Hematologic/Lymphatic Swollen glands?: No Easy bruising?: Yes  Cardiovascular Leg swelling?: No Chest pain?: No  Respiratory Cough?: No Shortness of breath?: No  Endocrine Excessive thirst?: No  Musculoskeletal Back pain?: No Joint pain?: No  Neurological Headaches?: No Dizziness?: No  Psychologic Depression?: No Anxiety?: No  Physical Exam: BP 110/63 (BP Location: Left Arm, Patient Position: Sitting, Cuff Size: Normal)   Pulse 60   Ht 5\' 2"  (1.575 m)   Wt 155 lb 14.4 oz (70.7 kg)   BMI 28.51 kg/m   Constitutional:  Alert and oriented, No acute distress. HEENT: [place] AT, moist mucus membranes.  Trachea midline, no masses. Cardiovascular: No clubbing, cyanosis, or edema. Respiratory:  Normal respiratory effort, no increased work of breathing. GI: Abdomen is soft, nontender, nondistended, no abdominal masses GU: No CVA tenderness.  Skin: No rashes, bruises or suspicious lesions. Lymph: No cervical or inguinal adenopathy. Neurologic: Grossly intact, no focal deficits, moving all 4 extremities. Psychiatric: Normal mood and affect.  Laboratory Data: Lab Results  Component Value Date   WBC 6.9 10/14/2016   HGB 12.3 10/14/2016   HCT 35.8 (L) 10/14/2016   MCV 96.1 10/14/2016   PLT 256.0 10/14/2016    Lab Results  Component Value Date   CREATININE 1.00 10/14/2016    No results found for: PSA  No results found for: TESTOSTERONE  Lab Results  Component Value Date   HGBA1C 5.6 06/12/2014    Urinalysis    Component Value Date/Time   COLORURINE YELLOW (A) 07/12/2015 1601   APPEARANCEUR Clear 09/25/2016 0953   LABSPEC 1.020 07/12/2015 1601   LABSPEC 1.027 06/10/2014 1759   PHURINE 5.0 07/12/2015 1601   GLUCOSEU Negative 09/25/2016 0953   GLUCOSEU Negative 06/10/2014 1759   HGBUR NEGATIVE 07/12/2015 1601   BILIRUBINUR Negative 09/25/2016 0953   BILIRUBINUR Negative 06/10/2014 1759   KETONESUR NEGATIVE 07/12/2015 1601   PROTEINUR Negative 09/25/2016  0953   PROTEINUR NEGATIVE 07/12/2015 1601   UROBILINOGEN 0.2 12/13/2013 1224   NITRITE Negative 09/25/2016 0953   NITRITE NEGATIVE 07/12/2015 1601   LEUKOCYTESUR Negative 09/25/2016 0953   LEUKOCYTESUR Trace 06/10/2014 1759     Assessment & Plan:    1. Overactive bladder The patient is overall relatively pleased with her improvement with Vesicare 5 mg. I did offer to increase the dose to 10 mg to further improve her symptoms, however she is concerned about increasing the medication due to constipation and dry mouth B more prevalent at 10 mg. She is not interested in trying this at this time. We also briefly discussed PTNS which she is not interested in. She like to continue her Vesicare 5 mg. This is reasonable as she seems to be overall happy with her response. We'll have her follow-up in one year or sooner if needed.   Return in about 1 year (around 11/21/2017).  Nickie Retort, MD  [facility] [address] [place], [place] [address] [phone]

## 2016-12-03 ENCOUNTER — Ambulatory Visit (INDEPENDENT_AMBULATORY_CARE_PROVIDER_SITE_OTHER): Payer: Medicare Other | Admitting: Family Medicine

## 2016-12-03 ENCOUNTER — Encounter: Payer: Self-pay | Admitting: Family Medicine

## 2016-12-03 VITALS — BP 100/60 | HR 68 | Temp 98.4°F | Ht 62.0 in | Wt 152.0 lb

## 2016-12-03 DIAGNOSIS — R630 Anorexia: Secondary | ICD-10-CM | POA: Diagnosis not present

## 2016-12-03 DIAGNOSIS — K5909 Other constipation: Secondary | ICD-10-CM | POA: Diagnosis not present

## 2016-12-03 DIAGNOSIS — R5383 Other fatigue: Secondary | ICD-10-CM

## 2016-12-03 LAB — CBC WITH DIFFERENTIAL/PLATELET
BASOS PCT: 0.6 % (ref 0.0–3.0)
Basophils Absolute: 0 10*3/uL (ref 0.0–0.1)
EOS PCT: 6.6 % — AB (ref 0.0–5.0)
Eosinophils Absolute: 0.4 10*3/uL (ref 0.0–0.7)
HCT: 36.7 % (ref 36.0–46.0)
Hemoglobin: 12.6 g/dL (ref 12.0–15.0)
LYMPHS PCT: 20.5 % (ref 12.0–46.0)
Lymphs Abs: 1.2 10*3/uL (ref 0.7–4.0)
MCHC: 34.3 g/dL (ref 30.0–36.0)
MCV: 94.3 fl (ref 78.0–100.0)
MONOS PCT: 7.7 % (ref 3.0–12.0)
Monocytes Absolute: 0.5 10*3/uL (ref 0.1–1.0)
NEUTROS ABS: 3.8 10*3/uL (ref 1.4–7.7)
Neutrophils Relative %: 64.6 % (ref 43.0–77.0)
PLATELETS: 240 10*3/uL (ref 150.0–400.0)
RBC: 3.89 Mil/uL (ref 3.87–5.11)
RDW: 13.2 % (ref 11.5–15.5)
WBC: 5.9 10*3/uL (ref 4.0–10.5)

## 2016-12-03 LAB — VITAMIN B12: Vitamin B-12: 676 pg/mL (ref 211–911)

## 2016-12-03 LAB — H. PYLORI ANTIBODY, IGG: H Pylori IgG: NEGATIVE

## 2016-12-03 NOTE — Progress Notes (Signed)
Subjective:   Patient ID: Jamie Matthews, female    DOB: June 25, 1940, 76 y.o.   MRN: [id number]  Jamie Matthews is a pleasant 76 y.o. year old female who presents to clinic today with her husband Fatigue and NO APPETITE  on 12/03/2016  HPI:  Fatigue, decreased appetite for 3 days.  Improved today.  Unfortunately, this is a common complaint of Jamie Matthews, along with constipation, and has been worked up by myself and GI.  Colonoscopy 02/14/16 Endoscopy 12/02/12  CMET, CBC, TSH unremarkable in 09/2016.  Lab Results  Component Value Date   WBC 6.9 10/14/2016   HGB 12.3 10/14/2016   HCT 35.8 (L) 10/14/2016   MCV 96.1 10/14/2016   PLT 256.0 10/14/2016   Lab Results  Component Value Date   TSH 2.55 10/14/2016   Lab Results  Component Value Date   ALT 17 10/14/2016   AST 17 10/14/2016   ALKPHOS 70 10/14/2016   BILITOT 0.4 10/14/2016   Lab Results  Component Value Date   NA 139 10/14/2016   K 4.1 10/14/2016   CL 104 10/14/2016   CO2 27 10/14/2016   Lab Results  Component Value Date   CREATININE 1.00 10/14/2016    Vit D normal in 04/2016. Vit B12 normal in 02/2016.  Lab Results  Component Value Date   VITAMINB12 640 03/17/2016     Current Outpatient Prescriptions on File Prior to Visit  Medication Sig Dispense Refill  . atorvastatin (LIPITOR) 20 MG tablet TAKE 1 TABLET BY MOUTH ONE TIME DAILY 90 tablet 1  . buPROPion (WELLBUTRIN XL) 150 MG 24 hr tablet TAKE 1 TABLET BY MOUTH DAILY 90 tablet 0  . Calcium Carbonate Antacid (ANTACID PO) Take by mouth as needed.    . desonide (DESOWEN) 0.05 % lotion     . dexlansoprazole (DEXILANT) 60 MG capsule Take 1 capsule (60 mg total) by mouth daily. 30 capsule 4  . diphenhydrAMINE (BENADRYL) 25 MG tablet Take 25 mg by mouth every 8 (eight) hours as needed.     . linaclotide (LINZESS) 290 MCG CAPS capsule Take 1 capsule (290 mcg total) by mouth daily before breakfast. 30 capsule 4  . lubiprostone  (AMITIZA) 24 MCG capsule TAKE 1 CAPSULE BY MOUTH TWO TIMES DAILY WITH A MEAL 60 capsule 5  . MAGNESIUM PO Take by mouth.    . memantine (NAMENDA) 10 MG tablet TAKE ONE TABLET TWICE A DAY 180 tablet 0  . Multiple Vitamins-Minerals (CENTRUM PO) Take one by mouth daily    . Omega-3 Fatty Acids (FISH OIL) 600 MG CAPS Take by mouth.    . polyethylene glycol (MIRALAX / GLYCOLAX) packet Take 17 g by mouth daily.    [name] Propylene Glycol (SYSTANE BALANCE OP) Apply to eye as needed.    . ranitidine (ZANTAC) 150 MG tablet Take 1 tablet (150 mg total) by mouth 2 (two) times daily. 60 tablet 4  . traZODone (DESYREL) 50 MG tablet Take 0.5-1 tablets (25-50 mg total) by mouth at bedtime as needed for sleep. 30 tablet 3  . venlafaxine XR (EFFEXOR-XR) 150 MG 24 hr capsule TAKE 1 CAPSULE BY MOUTH DAILY 90 capsule 1   No current facility-administered medications on file prior to visit.     Allergies  Allergen Reactions  . Sulfa Antibiotics Rash    Past Medical History:  Diagnosis Date  . Anxiety   . Arthritis   . Belching   . Bladder disorder    OVERACTIVE  . Bowel  dysfunction    BLOCKAGE  . Constipation   . Depression   . Diverticulitis   . Fibromyalgia   . GERD (gastroesophageal reflux disease)   . Hyperlipidemia   . IBS (irritable bowel syndrome)   . Internal hemorrhoids   . Memory deficits   . Pneumonia 11/18/12  . Serrated adenoma of colon   . Urinary incontinence     Past Surgical History:  Procedure Laterality Date  . BLADDER SUSPENSION  2004, 2012  . CATARACT EXTRACTION W/PHACO Right 08/27/2015   Procedure: CATARACT EXTRACTION PHACO AND INTRAOCULAR LENS PLACEMENT (IOC);  Surgeon: Estill Cotta, MD;  Location: [facility] ORS;  Service: Ophthalmology;  Laterality: Right;  [place]   1:00.2 AP%  22.5 CDE  23.67 fluid casette lot #[id number]  exp05/31/2018  . CATARACT EXTRACTION W/PHACO Left 10/15/2015   Procedure: CATARACT EXTRACTION PHACO AND INTRAOCULAR LENS PLACEMENT (IOC);  Surgeon: Estill Cotta, MD;  Location: [facility] ORS;  Service: Ophthalmology;  Laterality: Left;  [place] 01:07 AP% 18.1 CDE 21.57 fluid pack lot # [id number]  . CHOLECYSTECTOMY    . TONSILLECTOMY  1947    Family History  Problem Relation Age of Onset  . Cancer Father   . Diabetes Father   . Heart disease Father   . Heart disease Mother   . Breast cancer Sister 47  . Colon cancer Neg Hx   . Esophageal cancer Neg Hx   . Rectal cancer Neg Hx   . Stomach cancer Neg Hx   . Bladder Cancer Neg Hx   . Kidney cancer Neg Hx     Social History   Social History  . Marital status: Married    Spouse name: N/A  . Number of children: N/A  . Years of education: N/A   Occupational History  . Retired    Social History Main Topics  . Smoking status: Never Smoker  . Smokeless tobacco: Never Used  . Alcohol use Yes     Comment: OCCAS  . Drug use: No  . Sexual activity: Not Currently   Other Topics Concern  . Not on file   Social History Narrative   Recently moved with her husband to [place] from [place].   Husband is a retired Pharmacist, community.   No children.   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   The PMH, PSH, Social History, Family History, Medications, and allergies have been reviewed in [facility], and have been updated if relevant.   Review of Systems  Constitutional: Positive for appetite change and fatigue.  Gastrointestinal: Positive for abdominal pain and constipation. Negative for anal bleeding, blood in stool, diarrhea, nausea, rectal pain and vomiting.  Musculoskeletal: Negative.   Hematological: Negative.   All other systems reviewed and are negative.      Objective:    BP 100/60   Pulse 68   Temp 98.4 F (36.9 C)   Ht 5\' 2"  (1.575 m)   Wt 152 lb (68.9 kg)   SpO2 96%   BMI 27.80 kg/m   Wt Readings from Last 3 Encounters:  12/03/16 152 lb (68.9 kg)  11/21/16 155 lb 14.4 oz (70.7 kg)  11/11/16 153 lb (69.4 kg)     Physical Exam  Constitutional: She is oriented to  person, place, and time. She appears well-developed and well-nourished. No distress.  HENT:  Head: Normocephalic and atraumatic.  Eyes: Conjunctivae are normal.  Cardiovascular: Normal rate.   Pulmonary/Chest: Effort normal.  Abdominal: Soft. Bowel sounds are  normal. She exhibits no distension and no mass. There is no tenderness. There is no rebound and no guarding.  Neurological: She is alert and oriented to person, place, and time. No cranial nerve deficit.  Skin: Skin is warm and dry. She is not diaphoretic.  Psychiatric: She has a normal mood and affect. Her behavior is normal. Judgment and thought content normal.  Nursing note and vitals reviewed.         Assessment & Plan:   Chronic constipation  Other fatigue - Plan: H. pylori antibody, IgG, Vitamin B12, CBC with Differential/Platelet  Decreased appetite No Follow-up on file.

## 2016-12-03 NOTE — Progress Notes (Signed)
Pre visit review using our clinic review tool, if applicable. No additional management support is needed unless otherwise documented below in the visit note. 

## 2016-12-03 NOTE — Assessment & Plan Note (Signed)
With fatigue and constipation. >25 minutes spent in face to face time with patient, >50% spent in counselling or coordination of care. We spent the majority of today's visit reviewing her medications.  She seems to not have been taking all of her medication as prescribed. She will start using her pill box. Check H pylori as well today but symptoms seem to be improving now that she restarted her PPI and H2 blocker.

## 2017-01-06 ENCOUNTER — Encounter: Payer: Self-pay | Admitting: Family Medicine

## 2017-01-06 ENCOUNTER — Ambulatory Visit: Payer: Medicare Other | Admitting: Family Medicine

## 2017-01-07 ENCOUNTER — Ambulatory Visit (INDEPENDENT_AMBULATORY_CARE_PROVIDER_SITE_OTHER): Payer: Medicare Other | Admitting: Internal Medicine

## 2017-01-07 ENCOUNTER — Encounter: Payer: Self-pay | Admitting: Internal Medicine

## 2017-01-07 VITALS — BP 100/60 | HR 68 | Ht 62.0 in | Wt 155.0 lb

## 2017-01-07 DIAGNOSIS — K5909 Other constipation: Secondary | ICD-10-CM

## 2017-01-07 DIAGNOSIS — K219 Gastro-esophageal reflux disease without esophagitis: Secondary | ICD-10-CM

## 2017-01-07 MED ORDER — LUBIPROSTONE 24 MCG PO CAPS: ORAL_CAPSULE | ORAL | 2 refills | Status: DC

## 2017-01-07 MED ORDER — LINACLOTIDE 145 MCG PO CAPS: 145.0000 ug | ORAL_CAPSULE | Freq: Every day | ORAL | 2 refills | Status: DC

## 2017-01-07 MED ORDER — RANITIDINE HCL 150 MG PO TABS: 150.0000 mg | ORAL_TABLET | Freq: Two times a day (BID) | ORAL | 2 refills | Status: DC

## 2017-01-07 MED ORDER — DEXLANSOPRAZOLE 60 MG PO CPDR: DELAYED_RELEASE_CAPSULE | ORAL | 2 refills | Status: DC

## 2017-01-07 NOTE — Progress Notes (Signed)
Subjective:    Patient ID: Jamie Matthews, female    DOB: 29-Dec-1940, 76 y.o.   MRN: [id number]  HPI Jamie Matthews is a 76 yo female with PMH of GERD, colon polyps, chronic constipation with IBS who is here for follow-up.  She is with her husband today.  She was last seen in Jan 2018.  She reports that recently she has been having issues with both acid reflux/heartburn and constipation.  She has been maintained on multiple medications to help control these symptoms.  At time of last visit these issues were stable.  However, recently she reports breakthrough heartburn for which is is using TUMS gummy.  She has been taking Dexilant 60 mg daily but often late in the morning after breakfast. She is using Zantac twice a day at 150 mg but again more in the midday and around dinnertime. She denies dysphagia and odynophagia.  Also an issue for her has been constipation. She can go 3-4 days without bowel movement and when this occurs she feels low energy, poor appetite and nausea. She works hard to try to avoid constipation such as this. She has even started eating prunes. She is using Amitiza 24 g twice daily and Linzess 290 g before breakfas.  She's also using MiraLAX 17 g on most days.  At times she has loose frequent stools throughout the morning but yet can still go 3 days without bowel movement. She denies blood in her stool or melena.  Her last colonoscopy was 02/14/2016 after positive Cologuard. She had 2 polyps removed ranging from 5-12 mm found to be sessile serrated polyps without dysplasia.  Review of Systems As per HPI, otherwise negative  Current Medications, Allergies, Past Medical History, Past Surgical History, Family History and Social History were reviewed in [employer] record.     Objective:   Physical Exam BP 100/60   Pulse 68   Ht 5\' 2"  (1.575 m)   Wt 155 lb (70.3 kg)   BMI 28.35 kg/m  Constitutional: Well-developed and  well-nourished. No distress. HEENT: Normocephalic and atraumatic.   Conjunctivae are normal.  No scleral icterus. Neck: Neck supple. Trachea midline. Cardiovascular: Normal rate, regular rhythm and intact distal pulses.   Pulmonary/chest: Effort normal and breath sounds normal. No wheezing, rales or rhonchi. Abdominal: Soft, nontender, nondistended. Bowel sounds active throughout.   Extremities: no clubbing, cyanosis, or edema Neurological: Alert and oriented to person place and time. Skin: Skin is warm and dry. Psychiatric: Normal mood and affect. Behavior is normal.  CBC    Component Value Date/Time   WBC 5.9 12/03/2016 1037   RBC 3.89 12/03/2016 1037   HGB 12.6 12/03/2016 1037   HGB 12.6 06/12/2014 0518   HCT 36.7 12/03/2016 1037   HCT 37.9 06/12/2014 0518   PLT 240.0 12/03/2016 1037   PLT 204 06/12/2014 0518   MCV 94.3 12/03/2016 1037   MCV 96 06/12/2014 0518   MCH 32.1 07/12/2015 1437   MCHC 34.3 12/03/2016 1037   RDW 13.2 12/03/2016 1037   RDW 13.0 06/12/2014 0518   LYMPHSABS 1.2 12/03/2016 1037   LYMPHSABS 0.6 (L) 06/12/2014 0518   MONOABS 0.5 12/03/2016 1037   MONOABS 1.0 (H) 06/12/2014 0518   EOSABS 0.4 12/03/2016 1037   EOSABS 0.1 06/12/2014 0518   BASOSABS 0.0 12/03/2016 1037   BASOSABS 0.0 06/12/2014 0518   CMP     Component Value Date/Time   NA 139 10/14/2016 1555   NA 139 06/12/2014 0518  K 4.1 10/14/2016 1555   K 4.4 06/12/2014 0518   CL 104 10/14/2016 1555   CL 104 06/12/2014 0518   CO2 27 10/14/2016 1555   CO2 26 06/12/2014 0518   GLUCOSE 89 10/14/2016 1555   GLUCOSE 93 06/12/2014 0518   BUN 25 (H) 10/14/2016 1555   BUN 17 06/12/2014 0518   CREATININE 1.00 10/14/2016 1555   CREATININE 0.80 06/12/2014 0518   CALCIUM 9.3 10/14/2016 1555   CALCIUM 8.2 (L) 06/12/2014 0518   PROT 6.7 10/14/2016 1555   PROT 7.6 06/10/2014 1649   ALBUMIN 4.4 10/14/2016 1555   ALBUMIN 4.1 06/10/2014 1649   AST 17 10/14/2016 1555   AST 32 06/10/2014 1649   ALT 17  10/14/2016 1555   ALT 37 06/10/2014 1649   ALKPHOS 70 10/14/2016 1555   ALKPHOS 92 06/10/2014 1649   BILITOT 0.4 10/14/2016 1555   BILITOT 0.6 06/10/2014 1649   GFRNONAA 57 (L) 07/12/2015 1437   GFRNONAA >60 06/12/2014 0518   GFRNONAA >60 04/30/2013 2241   GFRAA >60 07/12/2015 1437   GFRAA >60 06/12/2014 0518   GFRAA >60 04/30/2013 2241      Assessment & Plan:  76 yo female with PMH of GERD, colon polyps, chronic constipation with IBS who is here for follow-up.  1.  GERD -- recent breakthrough some of which is likely related to timing/administration of her medications. I've encouraged her to move Dexilant 60 mg to 30 minutes before breakfast each day. She will continue ranitidine 150 mg twice daily but use the first dose midmorning and the second dose approximately 12 hours later about an hour before bed. She can use Gaviscon over-the-counter for breakthrough heartburn.  2. Chronic constipation -- she's having loose stools on some days which is bothersome for her but also can skip 2-3 days without bowel movements which leads to obstipation symptoms. Up-to-date with colonoscopy. We'll continue Amitiza 24 g twice daily with food and decrease Linzess to 145 g daily before breakfast. Hopefully decreasing the Linzess will lead to less loose stools days. I would like her to continue MiraLAX 17 g daily. If she does not have a bowel movement on any given day she is instructed to use MiraLAX 17 g twice daily until bowel movements resumed. She is happy with this plan  I will see her back in 2-3 months for follow-up to ensure that the above issues are improving 25 minutes spent with the patient today. Greater than 50% was spent in counseling and coordination of care with the patient

## 2017-01-07 NOTE — Patient Instructions (Addendum)
Please decrease your Linzess to 145 mcg 30 minutes before breakfast.  Take Amitiza 24 mg twice daily with meals.  Take Miralax 17 grams (1 capful) once daily. (purchase over the counter)  If you go more than 1 day without having a bowel movement, take 2 capfuls (34 ounces) of Miralax daily and continue this until you are having bowel movements every day, then you may decrease back down to 1 capsule of Miralax daily.  Take Dexilant 30 minutes before breakfast.  Take your Ranitidine (Zantac) every 12 hours (before breakfast and mid evening).  Please purchase the following medications over the counter and take as directed: Gaviscon as needed for breakthrough heartburn/reflux  Please follow up with Dr Hilarie Fredrickson in 3 months.  If you are age 43 or older, your body mass index should be between 23-30. Your Body mass index is 28.35 kg/m. If this is out of the aforementioned range listed, please consider follow up with your Primary Care Provider.  If you are age 86 or younger, your body mass index should be between 19-25. Your Body mass index is 28.35 kg/m. If this is out of the aformentioned range listed, please consider follow up with your Primary Care Provider.

## 2017-01-21 DIAGNOSIS — R2681 Unsteadiness on feet: Secondary | ICD-10-CM | POA: Diagnosis not present

## 2017-01-22 DIAGNOSIS — R2681 Unsteadiness on feet: Secondary | ICD-10-CM | POA: Diagnosis not present

## 2017-01-23 DIAGNOSIS — R2681 Unsteadiness on feet: Secondary | ICD-10-CM | POA: Diagnosis not present

## 2017-01-26 DIAGNOSIS — R2681 Unsteadiness on feet: Secondary | ICD-10-CM | POA: Diagnosis not present

## 2017-01-27 ENCOUNTER — Ambulatory Visit: Payer: Medicare Other | Admitting: Family Medicine

## 2017-01-28 ENCOUNTER — Other Ambulatory Visit: Payer: Self-pay | Admitting: Family Medicine

## 2017-01-28 ENCOUNTER — Other Ambulatory Visit: Payer: Self-pay | Admitting: Internal Medicine

## 2017-01-28 DIAGNOSIS — R2681 Unsteadiness on feet: Secondary | ICD-10-CM | POA: Diagnosis not present

## 2017-02-02 DIAGNOSIS — R2681 Unsteadiness on feet: Secondary | ICD-10-CM | POA: Diagnosis not present

## 2017-02-05 DIAGNOSIS — R2681 Unsteadiness on feet: Secondary | ICD-10-CM | POA: Diagnosis not present

## 2017-02-10 DIAGNOSIS — R2681 Unsteadiness on feet: Secondary | ICD-10-CM | POA: Diagnosis not present

## 2017-02-11 DIAGNOSIS — R2681 Unsteadiness on feet: Secondary | ICD-10-CM | POA: Diagnosis not present

## 2017-02-13 DIAGNOSIS — R2681 Unsteadiness on feet: Secondary | ICD-10-CM | POA: Diagnosis not present

## 2017-02-17 ENCOUNTER — Encounter: Payer: Self-pay | Admitting: Internal Medicine

## 2017-02-17 DIAGNOSIS — R2681 Unsteadiness on feet: Secondary | ICD-10-CM | POA: Diagnosis not present

## 2017-02-18 DIAGNOSIS — R2681 Unsteadiness on feet: Secondary | ICD-10-CM | POA: Diagnosis not present

## 2017-02-20 DIAGNOSIS — R2681 Unsteadiness on feet: Secondary | ICD-10-CM | POA: Diagnosis not present

## 2017-02-24 ENCOUNTER — Ambulatory Visit (INDEPENDENT_AMBULATORY_CARE_PROVIDER_SITE_OTHER): Payer: Medicare Other

## 2017-02-24 DIAGNOSIS — Z23 Encounter for immunization: Secondary | ICD-10-CM

## 2017-03-02 ENCOUNTER — Other Ambulatory Visit: Payer: Self-pay | Admitting: Family Medicine

## 2017-03-02 NOTE — Telephone Encounter (Signed)
Last refill 12/03/16 Last OV 09/08/16 #90  Ok to refill?

## 2017-03-09 ENCOUNTER — Other Ambulatory Visit: Payer: Self-pay | Admitting: Family Medicine

## 2017-03-09 DIAGNOSIS — Z1231 Encounter for screening mammogram for malignant neoplasm of breast: Secondary | ICD-10-CM

## 2017-03-13 ENCOUNTER — Other Ambulatory Visit: Payer: Self-pay | Admitting: Family Medicine

## 2017-03-16 NOTE — Telephone Encounter (Signed)
On 10.08.2018 #90 was faxed/thx dmf

## 2017-03-24 ENCOUNTER — Encounter: Payer: Self-pay | Admitting: Family Medicine

## 2017-03-24 ENCOUNTER — Ambulatory Visit (INDEPENDENT_AMBULATORY_CARE_PROVIDER_SITE_OTHER): Payer: Medicare Other | Admitting: Family Medicine

## 2017-03-24 DIAGNOSIS — R42 Dizziness and giddiness: Secondary | ICD-10-CM | POA: Diagnosis not present

## 2017-03-24 NOTE — Patient Instructions (Signed)

## 2017-03-24 NOTE — Assessment & Plan Note (Signed)
New- consistent with BPV. Given handout on home Eply maneuver. The patient indicates understanding of these issues and agrees with the plan. Call or return to clinic prn if these symptoms worsen or fail to improve as anticipated.

## 2017-03-24 NOTE — Progress Notes (Signed)
Subjective:   Patient ID: Jamie Matthews, female    DOB: Jun 09, 1940, 76 y.o.   MRN: [id number]  Jamie Matthews is a pleasant 76 y.o. year old female who presents to clinic today with Dizziness (Patient is here today C/O dizziness for a couple months.  It used to be intermittent but is now constant.  Saturday night she got up and fell into the dresser and hurt her ribs.  She describes dizziness as riding waves.)  on 03/24/2017  HPI:  Intermittent dizziness for at least two months.  Usually happens at night or in the morning when she get up to use the bathroom.  Happened on Saturday night and she actually ran into the dresser.  Feels like room is spinning.  No nausea, vomiting or visual changes.  Laying still seems to make it better.  Has not tried anything else for it.  Current Outpatient Prescriptions on File Prior to Visit  Medication Sig Dispense Refill  . atorvastatin (LIPITOR) 20 MG tablet TAKE 1 TABLET BY MOUTH ONE TIME DAILY 90 tablet 1  . buPROPion (WELLBUTRIN XL) 150 MG 24 hr tablet TAKE 1 TABLET BY MOUTH DAILY 90 tablet 0  . Calcium Carbonate Antacid (ANTACID PO) Take by mouth as needed.    . desonide (DESOWEN) 0.05 % lotion     . dexlansoprazole (DEXILANT) 60 MG capsule Take 1 capsule by mouth 30 minutes before breakfast 30 capsule 2  . diphenhydrAMINE (BENADRYL) 25 MG tablet Take 25 mg by mouth every 8 (eight) hours as needed.     . linaclotide (LINZESS) 145 MCG CAPS capsule Take 1 capsule (145 mcg total) by mouth daily before breakfast. 30 capsule 2  . lubiprostone (AMITIZA) 24 MCG capsule TAKE 1 CAPSULE BY MOUTH TWO TIMES DAILY WITH A MEAL 60 capsule 2  . MAGNESIUM PO Take by mouth.    . memantine (NAMENDA) 10 MG tablet TAKE ONE TABLET TWICE A DAY 180 tablet 0  . Multiple Vitamins-Minerals (CENTRUM PO) Take one by mouth daily    . MYRBETRIQ 50 MG TB24 tablet TAKE 1 TABLET BY MOUTH DAILY 90 tablet 2  . Omega-3 Fatty Acids (FISH OIL) 600 MG CAPS  Take by mouth.    . polyethylene glycol (MIRALAX / GLYCOLAX) packet Take 17 g by mouth daily.    [name] Propylene Glycol (SYSTANE BALANCE OP) Apply to eye as needed.    . ranitidine (ZANTAC) 150 MG tablet Take 1 tablet (150 mg total) by mouth every 12 (twelve) hours. Before breakfast and mid evening 60 tablet 2  . traZODone (DESYREL) 50 MG tablet Take 0.5-1 tablets (25-50 mg total) by mouth at bedtime as needed for sleep. 30 tablet 3  . venlafaxine XR (EFFEXOR-XR) 150 MG 24 hr capsule TAKE 1 CAPSULE BY MOUTH DAILY 90 capsule 1   No current facility-administered medications on file prior to visit.     Allergies  Allergen Reactions  . Sulfa Antibiotics Rash    Past Medical History:  Diagnosis Date  . Anxiety   . Arthritis   . Belching   . Bladder disorder    OVERACTIVE  . Bowel dysfunction    BLOCKAGE  . Constipation   . Depression   . Diverticulitis   . Fibromyalgia   . GERD (gastroesophageal reflux disease)   . Hyperlipidemia   . IBS (irritable bowel syndrome)   . Internal hemorrhoids   . Memory deficits   . Pneumonia 11/18/12  . Serrated adenoma of colon   . Urinary  incontinence     Past Surgical History:  Procedure Laterality Date  . BLADDER SUSPENSION  2004, 2012  . CATARACT EXTRACTION W/PHACO Right 08/27/2015   Procedure: CATARACT EXTRACTION PHACO AND INTRAOCULAR LENS PLACEMENT (IOC);  Surgeon: Estill Cotta, MD;  Location: [facility] ORS;  Service: Ophthalmology;  Laterality: Right;  [place]   1:00.2 AP%  22.5 CDE  23.67 fluid casette lot #[id number]  exp05/31/2018  . CATARACT EXTRACTION W/PHACO Left 10/15/2015   Procedure: CATARACT EXTRACTION PHACO AND INTRAOCULAR LENS PLACEMENT (IOC);  Surgeon: Estill Cotta, MD;  Location: [facility] ORS;  Service: Ophthalmology;  Laterality: Left;  [place] 01:07 AP% 18.1 CDE 21.57 fluid pack lot # [id number]  . CHOLECYSTECTOMY    . TONSILLECTOMY  1947    Family History  Problem Relation Age of Onset  . Cancer Father   . Diabetes Father   .  Heart disease Father   . Heart disease Mother   . Breast cancer Sister 39  . Colon cancer Neg Hx   . Esophageal cancer Neg Hx   . Rectal cancer Neg Hx   . Stomach cancer Neg Hx   . Bladder Cancer Neg Hx   . Kidney cancer Neg Hx     Social History   Social History  . Marital status: Married    Spouse name: N/A  . Number of children: N/A  . Years of education: N/A   Occupational History  . Retired    Social History Main Topics  . Smoking status: Never Smoker  . Smokeless tobacco: Never Used  . Alcohol use Yes     Comment: OCCAS  . Drug use: No  . Sexual activity: Not Currently   Other Topics Concern  . Not on file   Social History Narrative   Recently moved with her husband to [place] from [place].   Husband is a retired Pharmacist, community.   No children.   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   The PMH, PSH, Social History, Family History, Medications, and allergies have been reviewed in [facility], and have been updated if relevant.   Review of Systems  Constitutional: Negative.   Gastrointestinal: Negative.   Neurological: Positive for dizziness. Negative for tremors, seizures, syncope, facial asymmetry, speech difficulty, light-headedness, numbness and headaches.  All other systems reviewed and are negative.      Objective:    BP 102/66 (BP Location: Left Arm, Patient Position: Sitting, Cuff Size: Normal)   Pulse 67   Temp 97.7 F (36.5 C) (Oral)   Ht 5\' 2"  (1.575 m)   Wt 152 lb (68.9 kg)   SpO2 95%   BMI 27.80 kg/m    Physical Exam  Constitutional: She is oriented to person, place, and time. She appears well-developed and well-nourished. No distress.  HENT:  Head: Normocephalic and atraumatic.  Eyes: Conjunctivae are normal.  Cardiovascular: Normal rate.   Pulmonary/Chest: Effort normal.  Musculoskeletal: Normal range of motion. She exhibits no edema.  Neurological: She is alert and oriented to person, place, and time.  + nystagmus with dix  hallpike left  Skin: Skin is warm and dry. She is not diaphoretic.  Psychiatric: She has a normal mood and affect. Her behavior is normal. Judgment and thought content normal.  Nursing note reviewed.         Assessment & Plan:   Dizziness No Follow-up on file.

## 2017-04-03 ENCOUNTER — Ambulatory Visit
Admission: RE | Admit: 2017-04-03 | Discharge: 2017-04-03 | Disposition: A | Discharge status: Home / Self Care | Payer: Medicare Other | Source: Ambulatory Visit | Attending: Family Medicine | Admitting: Family Medicine

## 2017-04-03 DIAGNOSIS — Z1231 Encounter for screening mammogram for malignant neoplasm of breast: Secondary | ICD-10-CM | POA: Diagnosis not present

## 2017-04-03 DIAGNOSIS — R928 Other abnormal and inconclusive findings on diagnostic imaging of breast: Secondary | ICD-10-CM | POA: Insufficient documentation

## 2017-04-06 ENCOUNTER — Other Ambulatory Visit: Payer: Self-pay | Admitting: Family Medicine

## 2017-04-06 DIAGNOSIS — R928 Other abnormal and inconclusive findings on diagnostic imaging of breast: Secondary | ICD-10-CM

## 2017-04-06 DIAGNOSIS — N6489 Other specified disorders of breast: Secondary | ICD-10-CM

## 2017-04-22 ENCOUNTER — Ambulatory Visit
Admission: RE | Admit: 2017-04-22 | Discharge: 2017-04-22 | Disposition: A | Discharge status: Home / Self Care | Payer: Medicare Other | Source: Ambulatory Visit | Attending: Family Medicine | Admitting: Family Medicine

## 2017-04-22 DIAGNOSIS — N6489 Other specified disorders of breast: Secondary | ICD-10-CM

## 2017-04-22 DIAGNOSIS — R928 Other abnormal and inconclusive findings on diagnostic imaging of breast: Secondary | ICD-10-CM | POA: Diagnosis not present

## 2017-05-05 ENCOUNTER — Other Ambulatory Visit: Payer: Self-pay | Admitting: Internal Medicine

## 2017-05-07 ENCOUNTER — Telehealth: Payer: Self-pay | Admitting: Internal Medicine

## 2017-05-07 MED ORDER — DEXLANSOPRAZOLE 60 MG PO CPDR: DELAYED_RELEASE_CAPSULE | ORAL | 1 refills | Status: DC

## 2017-05-07 MED ORDER — LINACLOTIDE 145 MCG PO CAPS: 145.0000 ug | ORAL_CAPSULE | Freq: Every day | ORAL | 1 refills | Status: DC

## 2017-05-07 NOTE — Telephone Encounter (Signed)
Both rx's sent to pharmacy.

## 2017-05-27 ENCOUNTER — Other Ambulatory Visit: Payer: Self-pay | Admitting: Family Medicine

## 2017-05-28 ENCOUNTER — Telehealth: Payer: Self-pay | Admitting: Family Medicine

## 2017-05-28 MED ORDER — ZOSTER VAC RECOMB ADJUVANTED 50 MCG/0.5ML IM SUSR: 0.5000 mL | Freq: Once | INTRAMUSCULAR | 0 refills | Status: AC

## 2017-05-28 NOTE — Telephone Encounter (Signed)
Yes.  eRx sent.

## 2017-05-28 NOTE — Telephone Encounter (Signed)
TA-Can this Rx for Shingrix be sent to the pharmacy? Plz advise/thx dmf

## 2017-05-28 NOTE — Telephone Encounter (Signed)
Copied from [place] [id number]. Topic: General - Other >> May 28, 2017  2:39 PM Darl Householder, RMA wrote: Reason for CRM: patient is requesting a prescription for new shingles virus be sent to [facility]

## 2017-05-29 ENCOUNTER — Telehealth: Payer: Self-pay

## 2017-05-29 NOTE — Telephone Encounter (Signed)
Called pt to inform that I mailed a copy of [place] directions with printed map and office card/thx dmf  Copied from [place] [phone]. Topic: General - Other >> May 28, 2017  1:00 PM Jamie Matthews wrote: Reason for CRM: pt is asking for directions be sent to her again, as she can not find it.. Would like the letter

## 2017-05-29 NOTE — Telephone Encounter (Signed)
LMOVM stating that TA sent in what she requested/thx dmf

## 2017-06-08 ENCOUNTER — Other Ambulatory Visit: Payer: Self-pay | Admitting: Family Medicine

## 2017-06-09 ENCOUNTER — Encounter: Payer: Self-pay | Admitting: Family Medicine

## 2017-06-09 ENCOUNTER — Telehealth: Payer: Self-pay

## 2017-06-09 ENCOUNTER — Ambulatory Visit (INDEPENDENT_AMBULATORY_CARE_PROVIDER_SITE_OTHER): Payer: Medicare Other | Admitting: Family Medicine

## 2017-06-09 VITALS — BP 106/58 | HR 66 | Temp 98.2°F | Ht 62.0 in | Wt 155.2 lb

## 2017-06-09 DIAGNOSIS — M1712 Unilateral primary osteoarthritis, left knee: Secondary | ICD-10-CM | POA: Diagnosis not present

## 2017-06-09 DIAGNOSIS — M21611 Bunion of right foot: Secondary | ICD-10-CM | POA: Diagnosis not present

## 2017-06-09 NOTE — Assessment & Plan Note (Signed)
Refer to podiatry. The patient indicates understanding of these issues and agrees with the plan.

## 2017-06-09 NOTE — Telephone Encounter (Signed)
TA-This pt is scheduled for a visit with Candis Musa, LPN on 6.3.19 then on 6.10.19 with you/is the 1st visit at [facility] needed? Or should I call pt to change to a lab visit for her wellness here with you? I'm unsure/plz advise/thx dmf

## 2017-06-09 NOTE — Telephone Encounter (Signed)
Whichever she prefers- she can have her wellness done here with our wellness nurse or keep her appt with Tarlesia at [facility].

## 2017-06-09 NOTE — Assessment & Plan Note (Signed)
Refer to ortho- ? Cortisone injections.

## 2017-06-09 NOTE — Progress Notes (Signed)
Subjective:   Patient ID: Jamie Matthews, female    DOB: [birth year], 77 y.o.   MRN: [id number]  Jamie Matthews is a pleasant 77 y.o. year old female who presents to clinic today with Knee Pain (Patient is here today C/O bilateral knee pain but left being worse than the right.  This has been a chronic issue for at least 4 years.  She has been going to acupuncture but has been helpful but it doesn't help but for so long.  Denies any injury, swelling, redness or popping but she does state that the left knee has given way and she caught herself before falling.) and Toe Pain (She is also C/O her 2nd toe.  States that it has been painful for about 6 months now and she had to go to a bigger shoe in order to aleviate the pain.  There is a "corn" on the top of the same toe.)  on 06/09/2017  HPI:  Bunion- left great toe- has become more symptomatic in the winter months.  Wearing bigger shoes but she is concerned she will trip and fall. Had a corn but this has improved with OTC corn pads.  Bilateral knee OA- has been receiving acupuncture but she does not think that has been as effective.  She is having more pain and instability.  Would like to discuss other tx options. Current Outpatient Medications on File Prior to Visit  Medication Sig Dispense Refill  . atorvastatin (LIPITOR) 20 MG tablet TAKE 1 TABLET BY MOUTH ONE TIME DAILY 90 tablet 1  . buPROPion (WELLBUTRIN XL) 150 MG 24 hr tablet TAKE 1 TABLET BY MOUTH DAILY 90 tablet 0  . Calcium Carbonate Antacid (ANTACID PO) Take by mouth as needed.    . desonide (DESOWEN) 0.05 % lotion     . dexlansoprazole (DEXILANT) 60 MG capsule Take 1 capsule by mouth 30 minutes before breakfast 30 capsule 1  . diphenhydrAMINE (BENADRYL) 25 MG tablet Take 25 mg by mouth every 8 (eight) hours as needed.     [name] ketotifen (ZADITOR) 0.025 % ophthalmic solution 1 drop 2 (two) times daily.    [name] linaclotide (LINZESS) 145 MCG CAPS capsule Take 1 capsule  (145 mcg total) by mouth daily before breakfast. 30 capsule 1  . lubiprostone (AMITIZA) 24 MCG capsule TAKE 1 CAPSULE BY MOUTH TWO TIMES DAILY WITH A MEAL 60 capsule 2  . MAGNESIUM PO Take by mouth.    . memantine (NAMENDA) 10 MG tablet TAKE 1 TABLET BY MOUTH TWO TIMES DAILY 180 tablet 2  . Multiple Vitamins-Minerals (CENTRUM PO) Take one by mouth daily    . MYRBETRIQ 50 MG TB24 tablet TAKE 1 TABLET BY MOUTH DAILY 90 tablet 2  . Omega-3 Fatty Acids (FISH OIL) 600 MG CAPS Take by mouth.    . polyethylene glycol (MIRALAX / GLYCOLAX) packet Take 17 g by mouth daily.    . ranitidine (ZANTAC) 150 MG tablet Take 1 tablet (150 mg total) by mouth every 12 (twelve) hours. Before breakfast and mid evening 60 tablet 2  . venlafaxine XR (EFFEXOR-XR) 150 MG 24 hr capsule TAKE 1 CAPSULE BY MOUTH DAILY 90 capsule 1   No current facility-administered medications on file prior to visit.     Allergies  Allergen Reactions  . Sulfa Antibiotics Rash    Past Medical History:  Diagnosis Date  . Anxiety   . Arthritis   . Belching   . Bladder disorder    OVERACTIVE  . Bowel  dysfunction    BLOCKAGE  . Constipation   . Depression   . Diverticulitis   . Fibromyalgia   . GERD (gastroesophageal reflux disease)   . Hyperlipidemia   . IBS (irritable bowel syndrome)   . Internal hemorrhoids   . Memory deficits   . Pneumonia 11/18/12  . Urinary incontinence     Past Surgical History:  Procedure Laterality Date  . BLADDER SUSPENSION  2004, 2012  . CATARACT EXTRACTION W/PHACO Right 08/27/2015   Procedure: CATARACT EXTRACTION PHACO AND INTRAOCULAR LENS PLACEMENT (IOC);  Surgeon: Estill Cotta, MD;  Location: [facility] ORS;  Service: Ophthalmology;  Laterality: Right;  [place]   1:00.2 AP%  22.5 CDE  23.67 fluid casette lot #[id number]  exp05/31/2018  . CATARACT EXTRACTION W/PHACO Left 10/15/2015   Procedure: CATARACT EXTRACTION PHACO AND INTRAOCULAR LENS PLACEMENT (IOC);  Surgeon: Estill Cotta, MD;  Location:  [facility] ORS;  Service: Ophthalmology;  Laterality: Left;  [place] 01:07 AP% 18.1 CDE 21.57 fluid pack lot # [id number]  . CHOLECYSTECTOMY    . TONSILLECTOMY  1947    Family History  Problem Relation Age of Onset  . Cancer Father   . Diabetes Father   . Heart disease Father   . Heart disease Mother   . Breast cancer Sister 11  . Colon cancer Neg Hx   . Esophageal cancer Neg Hx   . Rectal cancer Neg Hx   . Stomach cancer Neg Hx   . Bladder Cancer Neg Hx   . Kidney cancer Neg Hx     Social History   Socioeconomic History  . Marital status: Married    Spouse name: Not on file  . Number of children: Not on file  . Years of education: Not on file  . Highest education level: Not on file  Social Needs  . Financial resource strain: Not on file  . Food insecurity - worry: Not on file  . Food insecurity - inability: Not on file  . Transportation needs - medical: Not on file  . Transportation needs - non-medical: Not on file  Occupational History  . Occupation: Retired  Tobacco Use  . Smoking status: Never Smoker  . Smokeless tobacco: Never Used  Substance and Sexual Activity  . Alcohol use: Yes    Comment: OCCAS  . Drug use: No  . Sexual activity: Not Currently  Other Topics Concern  . Not on file  Social History Narrative   Recently moved with her husband to [place] from [place].   Husband is a retired Pharmacist, community.   No children.   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   The PMH, PSH, Social History, Family History, Medications, and allergies have been reviewed in [facility], and have been updated if relevant.   Review of Systems  Musculoskeletal: Positive for arthralgias and joint swelling.  All other systems reviewed and are negative.      Objective:    BP (!) 106/58 (BP Location: Left Arm, Patient Position: Sitting, Cuff Size: Normal)   Pulse 66 Comment: Irregular  Temp 98.2 F (36.8 C) (Oral)   Ht 5\' 2"  (1.575 m)   Wt 155 lb 3.2 oz (70.4 kg)   SpO2 95%   BMI  28.39 kg/m    Physical Exam  Constitutional: She is oriented to person, place, and time. She appears well-developed and well-nourished. No distress.  HENT:  Head: Normocephalic and atraumatic.  Eyes: Conjunctivae are normal.  Cardiovascular: Normal rate.  Pulmonary/Chest:  Effort normal.  Musculoskeletal:       Right knee: She exhibits deformity.       Left knee: She exhibits deformity.       Left foot: There is deformity.  Deformity  Neurological: She is alert and oriented to person, place, and time. No cranial nerve deficit.  Skin: She is not diaphoretic.  Psychiatric: She has a normal mood and affect. Her behavior is normal. Judgment and thought content normal.  Nursing note and vitals reviewed.         Assessment & Plan:   Bunion of great toe of right foot - Plan: Ambulatory referral to Podiatry  Osteoarthritis of left knee, unspecified osteoarthritis type - Plan: Ambulatory referral to Orthopedic Surgery No Follow-up on file.

## 2017-06-10 NOTE — Telephone Encounter (Signed)
Noted/thx dmf 

## 2017-06-18 ENCOUNTER — Other Ambulatory Visit: Payer: Self-pay | Admitting: Internal Medicine

## 2017-06-23 ENCOUNTER — Encounter: Payer: Self-pay | Admitting: Podiatry

## 2017-06-23 ENCOUNTER — Ambulatory Visit (INDEPENDENT_AMBULATORY_CARE_PROVIDER_SITE_OTHER): Payer: Medicare Other

## 2017-06-23 ENCOUNTER — Ambulatory Visit (INDEPENDENT_AMBULATORY_CARE_PROVIDER_SITE_OTHER): Payer: Medicare Other | Admitting: Podiatry

## 2017-06-23 ENCOUNTER — Other Ambulatory Visit: Payer: Self-pay | Admitting: Podiatry

## 2017-06-23 DIAGNOSIS — M2041 Other hammer toe(s) (acquired), right foot: Secondary | ICD-10-CM

## 2017-06-23 DIAGNOSIS — M21611 Bunion of right foot: Secondary | ICD-10-CM

## 2017-06-23 NOTE — Progress Notes (Signed)
   Subjective:    Patient ID: Jamie Matthews, female    DOB: 01-Dec-1940, 77 y.o.   MRN: [id number]  HPI    Review of Systems  Musculoskeletal: Positive for gait problem.  All other systems reviewed and are negative.      Objective:   Physical Exam        Assessment & Plan:

## 2017-06-24 ENCOUNTER — Ambulatory Visit (INDEPENDENT_AMBULATORY_CARE_PROVIDER_SITE_OTHER): Payer: Medicare Other | Admitting: Orthopaedic Surgery

## 2017-06-24 ENCOUNTER — Encounter (INDEPENDENT_AMBULATORY_CARE_PROVIDER_SITE_OTHER): Payer: Self-pay | Admitting: Orthopaedic Surgery

## 2017-06-24 ENCOUNTER — Ambulatory Visit (INDEPENDENT_AMBULATORY_CARE_PROVIDER_SITE_OTHER): Payer: Medicare Other

## 2017-06-24 DIAGNOSIS — G8929 Other chronic pain: Secondary | ICD-10-CM

## 2017-06-24 DIAGNOSIS — M25562 Pain in left knee: Secondary | ICD-10-CM | POA: Diagnosis not present

## 2017-06-24 DIAGNOSIS — M1712 Unilateral primary osteoarthritis, left knee: Secondary | ICD-10-CM | POA: Diagnosis not present

## 2017-06-24 MED ORDER — METHYLPREDNISOLONE ACETATE 40 MG/ML IJ SUSP
40.0000 mg | INTRAMUSCULAR | Status: AC | PRN
  Administered 2017-06-24: 40 mg via INTRA_ARTICULAR

## 2017-06-24 MED ORDER — LIDOCAINE HCL 1 % IJ SOLN
3.0000 mL | INTRAMUSCULAR | Status: AC | PRN
  Administered 2017-06-24: 3 mL

## 2017-06-24 NOTE — Progress Notes (Signed)
Office Visit Note   Patient: Jamie Matthews           Date of Birth: [birth year]           MRN: [id number] Visit Date: 06/24/2017              Requested by: Lucille Passy, MD [place],  [id number] PCP: Lucille Passy, MD   Assessment & Plan: Visit Diagnoses:  1. Chronic pain of left knee   2. Unilateral primary osteoarthritis, left knee     Plan: We talked about being conservative for now working on quad strengthening exercises.  I do feel at some point she is going to need a knee replacement given the severity of the arthritis in her knee.  She understands this as well.  We also want to try steroid injection in her knee and follow this up in 4 weeks with hyaluronic acid again trying to stay conservative and she is agreeable to this as well.  We explained the risk and benefits of this treatment course as well as steroids in general.  She tolerated steroid injection well.  We will see her back in 4 weeks to place hyaluronic acid in her left knee.  All questions concerns were answered and addressed.  Follow-Up Instructions: Return in about 4 weeks (around 07/22/2017).   Orders:  Orders Placed This Encounter  Procedures  . Large Joint Inj  . XR Knee 1-2 Views Left   No orders of the defined types were placed in this encounter.     Procedures: Large Joint Inj: L knee on 06/24/2017 2:49 PM Indications: diagnostic evaluation and pain Details: 22 G 1.5 in needle, superolateral approach  Arthrogram: No  Medications: 3 mL lidocaine 1 %; 40 mg methylPREDNISolone acetate 40 MG/ML Outcome: tolerated well, no immediate complications Procedure, treatment alternatives, risks and benefits explained, specific risks discussed. Consent was given by the patient. Immediately prior to procedure a time out was called to verify the correct patient, procedure, equipment, support staff and site/side marked as required. Patient was prepped and draped in the usual  sterile fashion.       Clinical Data: No additional findings.   Subjective: Chief Complaint  Patient presents with  . Left Knee - Pain  Patient is a very pleasant 77 year old who comes in for evaluation of chronic left knee pain.  She points to the medial joint space source of her pain.  It hurts mainly with activities and pivoting.  It does wake her up at night on occasion.  Is been slowly worsening over the years now.  She is originally from [place] and said she had an injection in her knee with a steroid sometime ago.  She said that did help for a while.  This is slowly beginning to detrimentally affected x-rays daily living and her quality of life.  HPI  Review of Systems She currently denies any hip pain.  She denies any chest pain, shortness of breath, fever, chills, nausea, vomiting, headache  Objective: Vital Signs: There were no vitals taken for this visit.  Physical Exam She is alert and oriented x3 and in no acute distress Ortho Exam Examination of her left knee shows mild varus malalignment.  There is significant and painful medial joint line tenderness.  She has a positive Murray sign to the medial side as well.  She is otherwise ligamentously stable with good range of motion of that knee but otherwise painful range of motion. Specialty Comments:  No specialty comments available.  Imaging: Dg Foot Complete Right  Result Date: 06/23/2017 Please see detailed radiograph report in office note.  Xr Knee 1-2 Views Left  Result Date: 06/24/2017 An AP and lateral of the left knee shows tricompartmental arthritic changes.  There is varus malalignment with complete loss of the medial joint space.  There are periarticular osteophytes throughout the knee.  There is significant patellofemoral arthritic changes.    PMFS History: Patient Active Problem List   Diagnosis Date Noted  . Unilateral primary osteoarthritis, left knee 06/24/2017  . Bunion of great toe of right  foot 06/09/2017  . Dizziness 03/24/2017  . Decreased appetite 12/03/2016  . Fatigue 09/22/2016  . Insomnia 09/22/2016  . Depression, recurrent ([place]) 09/22/2016  . Primary localized osteoarthrosis, hand 03/19/2016  . OA (osteoarthritis) of knee 03/17/2016  . Stress due to illness of family member 10/10/2014  . Ileitis 06/22/2014  . Chronic insomnia 09/20/2013  . DNR (do not resuscitate) 11/15/2012  . Chronic constipation 04/01/2012  . 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 Urinary incontinence   . Major depressive disorder, recurrent, moderate   . GERD (gastroesophageal reflux disease)   . Hyperlipidemia    Past Medical History:  Diagnosis Date  . Anxiety   . Arthritis   . Belching   . Bladder disorder    OVERACTIVE  . Bowel dysfunction    BLOCKAGE  . Constipation   . Depression   . Diverticulitis   . Fibromyalgia   . GERD (gastroesophageal reflux disease)   . Hyperlipidemia   . IBS (irritable bowel syndrome)   . Internal hemorrhoids   . Memory deficits   . Pneumonia 11/18/12  . Urinary incontinence     Family History  Problem Relation Age of Onset  . Cancer Father   . Diabetes Father   . Heart disease Father   . Heart disease Mother   . Breast cancer Sister 47  . Colon cancer Neg Hx   . Esophageal cancer Neg Hx   . Rectal cancer Neg Hx   . Stomach cancer Neg Hx   . Bladder Cancer Neg Hx   . Kidney cancer Neg Hx     Past Surgical History:  Procedure Laterality Date  . BLADDER SUSPENSION  2004, 2012  . CATARACT EXTRACTION W/PHACO Right 08/27/2015   Procedure: CATARACT EXTRACTION PHACO AND INTRAOCULAR LENS PLACEMENT (IOC);  Surgeon: Estill Cotta, MD;  Location: [facility] ORS;  Service: Ophthalmology;  Laterality: Right;  [place]   1:00.2 AP%  22.5 CDE  23.67 fluid casette lot #[id number]  exp05/31/2018  . CATARACT EXTRACTION W/PHACO Left 10/15/2015   Procedure: CATARACT EXTRACTION PHACO AND INTRAOCULAR LENS PLACEMENT (IOC);  Surgeon: Estill Cotta, MD;  Location: [facility] ORS;   Service: Ophthalmology;  Laterality: Left;  [place] 01:07 AP% 18.1 CDE 21.57 fluid pack lot # [id number]  . CHOLECYSTECTOMY    . TONSILLECTOMY  1947   Social History   Occupational History  . Occupation: Retired  Tobacco Use  . Smoking status: Never Smoker  . Smokeless tobacco: Never Used  Substance and Sexual Activity  . Alcohol use: Yes    Comment: OCCAS  . Drug use: No  . Sexual activity: Not Currently

## 2017-06-25 NOTE — Progress Notes (Signed)
   Subjective: 77 year old female presenting today as a new patient with a chief complaint of pain to the right second toe secondary to a hammertoe that has been ongoing for the past ten years. She reports h/o hammertoe repair surgery to the toe and has a painful callus lesion to the area now. She has been wearing bigger shoes to alleviate the pain but states they only cause her to trip. Touching the area increases the pain. Patient is here for further evaluation and treatment.    Past Medical History:  Diagnosis Date  . Anxiety   . Arthritis   . Belching   . Bladder disorder    OVERACTIVE  . Bowel dysfunction    BLOCKAGE  . Constipation   . Depression   . Diverticulitis   . Fibromyalgia   . GERD (gastroesophageal reflux disease)   . Hyperlipidemia   . IBS (irritable bowel syndrome)   . Internal hemorrhoids   . Memory deficits   . Pneumonia 11/18/12  . Urinary incontinence        Objective: Physical Exam General: The patient is alert and oriented x3 in no acute distress.  Dermatology: Skin is cool, dry and supple bilateral lower extremities. Negative for open lesions or macerations.  Vascular: Palpable pedal pulses bilaterally. No edema or erythema noted. Capillary refill within normal limits.  Neurological: Epicritic and protective threshold grossly intact bilaterally.   Musculoskeletal Exam: Clinical evidence of bunion deformity noted to the respective foot. There is a moderate pain on palpation range of motion of the first MPJ. Lateral deviation of the hallux noted consistent with hallux abductovalgus. Hammertoe contracture also noted on clinical exam to the 2nd digit of the right foot. Symptomatic pain on palpation and range of motion also noted to the metatarsal phalangeal joints of the respective hammertoe digits.    Radiographic Exam: Increased intermetatarsal angle greater than 15 with a hallux abductus angle greater than 30 noted on AP view. Moderate degenerative  changes noted within the first MPJ. Contracture deformity also noted to the interphalangeal joints and MPJs of the digits of the respective hammertoes.    Assessment: 1. HAV w/ bunion deformity right foot 2. 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 Patient was evaluated. X-Rays reviewed. 2. Discussed conservative versus surgical management. Patient opts for conservative treatment at this time including shoe gear modifications.  3. Silicone toe caps dispensed.  4. Return to clinic as needed.    Edrick Kins, DPM [facility]  Dr. Edrick Kins, [address]                                        [place], [place] [address]                Office [phone]  Fax [phone]

## 2017-06-26 ENCOUNTER — Telehealth: Payer: Self-pay | Admitting: Family Medicine

## 2017-06-26 MED ORDER — BUPROPION HCL ER (XL) 150 MG PO TB24: 150.0000 mg | ORAL_TABLET | Freq: Every day | ORAL | 1 refills | Status: DC

## 2017-06-26 MED ORDER — VENLAFAXINE HCL ER 150 MG PO CP24: 150.0000 mg | ORAL_CAPSULE | Freq: Every day | ORAL | 1 refills | Status: DC

## 2017-06-26 MED ORDER — LUBIPROSTONE 24 MCG PO CAPS: ORAL_CAPSULE | ORAL | 1 refills | Status: DC

## 2017-06-26 NOTE — Telephone Encounter (Signed)
Rx sent to [facility] for Bupropion as requested by pt/thx dmf

## 2017-06-26 NOTE — Telephone Encounter (Signed)
Copied from [place]. Topic: Quick Communication - Rx Refill/Question >> Jun 26, 2017 10:44 AM Oliver Pila B wrote: Medication: buPROPion (WELLBUTRIN XL) 150 MG 24 hr tablet [[id number],  lubiprostone (AMITIZA) 24 MCG capsule [[id number]] ,  venlafaxine XR (EFFEXOR-XR) 150 MG 24 hr capsule [[id number]]    Has the patient contacted their pharmacy? Yes.     (Agent: If no, request that the patient contact the pharmacy for the refill.)   Preferred Pharmacy (with phone number or street name): [facility]   Agent: Please be advised that RX refills may take up to 3 business days. We ask that you follow-up with your pharmacy.

## 2017-06-26 NOTE — Addendum Note (Signed)
Addended by: Marrion Coy on: 06/26/2017 02:08 PM   Modules accepted: Orders

## 2017-06-26 NOTE — Telephone Encounter (Signed)
Also sent in Amitiza and Effexor/thx dmf

## 2017-06-29 ENCOUNTER — Ambulatory Visit (INDEPENDENT_AMBULATORY_CARE_PROVIDER_SITE_OTHER): Payer: Medicare Other | Admitting: Internal Medicine

## 2017-06-29 ENCOUNTER — Encounter: Payer: Self-pay | Admitting: Internal Medicine

## 2017-06-29 VITALS — BP 116/68 | HR 64 | Ht 65.0 in | Wt 153.4 lb

## 2017-06-29 DIAGNOSIS — Z86010 Personal history of colonic polyps: Secondary | ICD-10-CM | POA: Diagnosis not present

## 2017-06-29 DIAGNOSIS — K581 Irritable bowel syndrome with constipation: Secondary | ICD-10-CM

## 2017-06-29 DIAGNOSIS — K219 Gastro-esophageal reflux disease without esophagitis: Secondary | ICD-10-CM | POA: Diagnosis not present

## 2017-06-29 MED ORDER — RANITIDINE HCL 150 MG PO TABS: 150.0000 mg | ORAL_TABLET | Freq: Two times a day (BID) | ORAL | 5 refills | Status: DC

## 2017-06-29 NOTE — Progress Notes (Signed)
   Subjective:    Patient ID: Jamie Matthews, female    DOB: 12-07-1940, 77 y.o.   MRN: [id number]  HPI Jamie Matthews is a 77 year old female with a history of GERD, colon polyps, chronic constipation with IBS who is here for follow-up.  She is here today with her husband and was last seen in August 2018.  At the time of her last office visit we changed her Dexilant timing to help with reflux and indigestion.  She continue Zantac 50 mg twice daily we also continued her current bowel regimen given her history of significant IBS with constipation.  Most recently she states she has been doing well however she has had intermittent issues with constipation.  On further discussion she is somewhat confused as to which medication treats which problem.  It sounds like she was stopping Dexilant when she was developing loose stools.  She is also intermittently using MiraLAX.  She denies dysphagia and odynophagia.  No abdominal pain unless she becomes constipated.  No blood in her stool or melena.   Review of Systems As per HPI, otherwise negative  Current Medications, Allergies, Past Medical History, Past Surgical History, Family History and Social History were reviewed in [employer] record.      Objective:   Physical Exam BP 116/68   Pulse 64   Ht 5\' 5"  (1.651 m)   Wt 153 lb 6 oz (69.6 kg)   BMI 25.52 kg/m  Constitutional: Well-developed and well-nourished. No distress. HEENT: Normocephalic and atraumatic.  No scleral icterus. Neck: Neck supple. Trachea midline. Cardiovascular: Normal rate, regular rhythm and intact distal pulses.  Pulmonary/chest: Effort normal and breath sounds normal. No wheezing, rales or rhonchi. Abdominal: Soft, nontender, nondistended. Bowel sounds active throughout.  Extremities: no clubbing, cyanosis, or edema Neurological: Alert and oriented to person place and time. Skin: Skin is warm and dry. Psychiatric: Normal mood and  affect. Behavior is normal.     Assessment & Plan:  77 year old female with a history of GERD, colon polyps, chronic constipation with IBS who is here for follow-up  1. GERD --it sounds as if her indigestion and GERD has improved with changing her PPI timing.  We reviewed each medication today and I have written out the medications for her to let her know the indication and timing for her medicines.  I would like her to continue Dexilant 30 mg daily about 30 minutes to an hour before breakfast.  She can continue ranitidine 150 mg twice daily  2.  Chronic constipation with IBS --she is having fluctuating constipation and then at other times loose stools.  She is certainly constipation predominant.  We reviewed medications and she was confused as to which medications were helping which issue.  I also wrote down these medications and administration instructions as follows:  -- Continue Linzess 145 mcg 30 minutes before breakfast; continue Amitiza 24 mcg twice daily with food --If stools are loose, hold the Linzess for 1-2 days --If constipated, can increase Linzess to 290 mcg daily --If still constipated with higher dose of Linzess, can use MiraLAX 17 g daily and notify me  3.  History of colon polyps --last colonoscopy was 02/14/2016 after positive Cologuard.  She had 2 polyps removed the largest 12 mm.  Consider repeat in 3 years  Follow-up in 3-6 months, sooner if needed

## 2017-06-29 NOTE — Patient Instructions (Signed)
Continue Dexilant 30 mg every morning.  Continue ranitidine 150 mg twice daily.  Take Amitiza 24 mcg twice daily with food and take Linzess 145 mcg daily with breakfast.  IF constipated, continue Amitiza 24 mg twice daily and DOUBLE up on the Linzess.  IF no results, you may ADD Miralax 1 capful (17 grams) dissolved in at least 8 ounces water/juice in addition to the amitiza and linzess until normal bowel habits resume.  IF your stools are too loose on any given day, you may take Amitiza 24 mcg twice daily and  HOLD Linzess until bowel habits return to normal.  Please follow up with Dr Hilarie Fredrickson in 6 months.  If you are age 66 or older, your body mass index should be between 23-30. Your Body mass index is 25.52 kg/m. If this is out of the aforementioned range listed, please consider follow up with your Primary Care Provider.  If you are age 54 or younger, your body mass index should be between 19-25. Your Body mass index is 25.52 kg/m. If this is out of the aformentioned range listed, please consider follow up with your Primary Care Provider.

## 2017-07-06 DIAGNOSIS — L820 Inflamed seborrheic keratosis: Secondary | ICD-10-CM | POA: Diagnosis not present

## 2017-07-06 DIAGNOSIS — L219 Seborrheic dermatitis, unspecified: Secondary | ICD-10-CM | POA: Diagnosis not present

## 2017-07-06 DIAGNOSIS — L720 Epidermal cyst: Secondary | ICD-10-CM | POA: Diagnosis not present

## 2017-07-06 DIAGNOSIS — L821 Other seborrheic keratosis: Secondary | ICD-10-CM | POA: Diagnosis not present

## 2017-07-13 ENCOUNTER — Telehealth: Payer: Self-pay

## 2017-07-13 ENCOUNTER — Other Ambulatory Visit: Payer: Self-pay

## 2017-07-13 DIAGNOSIS — E7800 Pure hypercholesterolemia, unspecified: Secondary | ICD-10-CM

## 2017-07-13 DIAGNOSIS — F331 Major depressive disorder, recurrent, moderate: Secondary | ICD-10-CM

## 2017-07-13 MED ORDER — MIRABEGRON ER 50 MG PO TB24: 50.0000 mg | ORAL_TABLET | Freq: Every day | ORAL | 2 refills | Status: DC

## 2017-07-13 MED ORDER — ATORVASTATIN CALCIUM 20 MG PO TABS: ORAL_TABLET | ORAL | 1 refills | Status: DC

## 2017-07-13 NOTE — Telephone Encounter (Signed)
TA-Pt has lab visit for AWV on 6.5.19/what would you like for future order? CMP; CBC; Lipid; TSH; Vit-Priscila Bean (10.23.17 was 28.74)/plz advise/thx dmf

## 2017-07-13 NOTE — Telephone Encounter (Signed)
I have entered her lab orders.  Thanks!

## 2017-07-14 ENCOUNTER — Other Ambulatory Visit: Payer: Self-pay | Admitting: *Deleted

## 2017-07-14 MED ORDER — LINACLOTIDE 145 MCG PO CAPS: 145.0000 ug | ORAL_CAPSULE | Freq: Every day | ORAL | 2 refills | Status: DC

## 2017-07-23 ENCOUNTER — Ambulatory Visit (INDEPENDENT_AMBULATORY_CARE_PROVIDER_SITE_OTHER): Payer: Medicare Other | Admitting: Orthopaedic Surgery

## 2017-07-23 ENCOUNTER — Encounter (INDEPENDENT_AMBULATORY_CARE_PROVIDER_SITE_OTHER): Payer: Self-pay | Admitting: Orthopaedic Surgery

## 2017-07-23 DIAGNOSIS — M1712 Unilateral primary osteoarthritis, left knee: Secondary | ICD-10-CM | POA: Diagnosis not present

## 2017-07-23 MED ORDER — HYALURONAN 88 MG/4ML IX SOSY
88.0000 mg | PREFILLED_SYRINGE | INTRA_ARTICULAR | Status: AC | PRN
  Administered 2017-07-23: 88 mg via INTRA_ARTICULAR

## 2017-07-23 NOTE — Progress Notes (Signed)
   Procedure Note  Patient: Jamie Matthews             Date of Birth: December 06, 1940           MRN: [id number]             Visit Date: 07/23/2017  Procedures: Visit Diagnoses: Unilateral primary osteoarthritis, left knee  Large Joint Inj: L knee on 07/23/2017 2:59 PM Indications: pain and diagnostic evaluation Details: 22 G 1.5 in needle, superolateral approach  Arthrogram: No  Medications: 88 mg Hyaluronan 88 MG/4ML Outcome: tolerated well, no immediate complications Procedure, treatment alternatives, risks and benefits explained, specific risks discussed. Consent was given by the patient. Immediately prior to procedure a time out was called to verify the correct patient, procedure, equipment, support staff and site/side marked as required. Patient was prepped and draped in the usual sterile fashion.     Patient is here today for scheduled Monovisc injection with hyaluronic acid in her left knee to treat moderate osteoarthritis and osteoarthritis pain.  She understands why we are doing this injection will have recommended it.  She did tolerate steroid injection her last visit well.  Her left knee shows no effusion today with good range of motion but global tenderness.  She tolerated the Monovisc injection well.  We will see her back as needed knowing that if she has knee pain in 3 months she can always call for steroid injection.  All questions concerns were answered and addressed.

## 2017-07-28 ENCOUNTER — Telehealth: Payer: Self-pay

## 2017-07-28 ENCOUNTER — Other Ambulatory Visit: Payer: Self-pay | Admitting: Internal Medicine

## 2017-07-28 MED ORDER — DEXLANSOPRAZOLE 30 MG PO CPDR: 30.0000 mg | DELAYED_RELEASE_CAPSULE | Freq: Every day | ORAL | 3 refills | Status: DC

## 2017-07-28 MED ORDER — DEXLANSOPRAZOLE 60 MG PO CPDR: 60.0000 mg | DELAYED_RELEASE_CAPSULE | Freq: Every day | ORAL | 3 refills | Status: DC

## 2017-07-28 NOTE — Telephone Encounter (Signed)
Rx has been sent for Dexilant. Rx for Linzess was sent on 07/14/17. Patient has been advised that Dexilant has been sent to pharmacy and to contact [facility] regarding Linzess Rx.

## 2017-07-28 NOTE — Telephone Encounter (Signed)
Copied from [place]. Topic: Referral - Request >> Jul 28, 2017  9:42 AM Ether Griffins B wrote: Reason for CRM: pt would like referral to the urologist she seen years ago at [facility] long. Pt was disconnected before finding out the reason for the referral.

## 2017-07-28 NOTE — Telephone Encounter (Signed)
Would continue at that dose she has been most recently on

## 2017-07-28 NOTE — Telephone Encounter (Signed)
[facility] pharmacy states that patient is requesting Dexilant 60 mg daily refills, however your office note from 06/29/17 indicates she should be taking Dexilant 30 mg daily. Which dosage would you truly like her to be on?

## 2017-07-28 NOTE — Addendum Note (Signed)
Addended by: Larina Bras on: 07/28/2017 05:05 PM   Modules accepted: Orders

## 2017-07-28 NOTE — Telephone Encounter (Signed)
Rx for Dexilant 60 mg sent to pharmacy.

## 2017-07-28 NOTE — Telephone Encounter (Signed)
Patient states she needs medications dexilant and linzess refilled at [facility]. Pt had ov on 2.4.19

## 2017-07-29 NOTE — Telephone Encounter (Signed)
Pt has OV on 3.7.19/thx dmf

## 2017-07-30 ENCOUNTER — Encounter: Payer: Self-pay | Admitting: Family Medicine

## 2017-07-30 ENCOUNTER — Ambulatory Visit (INDEPENDENT_AMBULATORY_CARE_PROVIDER_SITE_OTHER): Payer: Medicare Other | Admitting: Family Medicine

## 2017-07-30 VITALS — BP 118/68 | HR 56 | Temp 97.6°F | Ht 65.0 in | Wt 153.4 lb

## 2017-07-30 DIAGNOSIS — R350 Frequency of micturition: Secondary | ICD-10-CM | POA: Diagnosis not present

## 2017-07-30 DIAGNOSIS — H8110 Benign paroxysmal vertigo, unspecified ear: Secondary | ICD-10-CM | POA: Insufficient documentation

## 2017-07-30 DIAGNOSIS — R110 Nausea: Secondary | ICD-10-CM | POA: Diagnosis not present

## 2017-07-30 DIAGNOSIS — H8112 Benign paroxysmal vertigo, left ear: Secondary | ICD-10-CM

## 2017-07-30 LAB — POCT URINALYSIS DIPSTICK
Bilirubin, UA: NEGATIVE
Glucose, UA: NEGATIVE
Ketones, UA: NEGATIVE
LEUKOCYTES UA: NEGATIVE
NITRITE UA: NEGATIVE
PROTEIN UA: NEGATIVE
RBC UA: NEGATIVE
SPEC GRAV UA: 1.015 (ref 1.010–1.025)
Urobilinogen, UA: 0.2 E.U./dL
pH, UA: 6 (ref 5.0–8.0)

## 2017-07-30 MED ORDER — ONDANSETRON HCL 4 MG/2ML IJ SOLN
4.0000 mg | Freq: Once | INTRAMUSCULAR | Status: AC
  Administered 2017-07-30: 4 mg via INTRAMUSCULAR

## 2017-07-30 NOTE — Progress Notes (Signed)
Subjective:   Patient ID: Jamie Matthews, female    DOB: 04-01-1941, 77 y.o.   MRN: [id number]  Autie Vasudevan is a pleasant 77 y.o. year old female who presents to clinic today with Urinary Frequency (Pt is here today C/O urinary frequency.)  on 07/30/2017  HPI:  Feels her OAB symptoms have been worsening over the last several months. Increased urinary frequency and urge/stress incontinence. Denies dysuria.  She is having vertigo today- very dizzy when she changes head positions. Also nauseated.   Current Outpatient Medications on File Prior to Visit  Medication Sig Dispense Refill  . atorvastatin (LIPITOR) 20 MG tablet TAKE 1 TABLET BY MOUTH ONE TIME DAILY 90 tablet 1  . buPROPion (WELLBUTRIN XL) 150 MG 24 hr tablet Take 1 tablet (150 mg total) by mouth daily. 90 tablet 1  . Calcium Carbonate Antacid (ANTACID PO) Take by mouth as needed.    . desonide (DESOWEN) 0.05 % lotion     . dexlansoprazole (DEXILANT) 60 MG capsule Take 1 capsule (60 mg total) by mouth daily. 30 capsule 3  . diphenhydrAMINE (BENADRYL) 25 MG tablet Take 25 mg by mouth every 8 (eight) hours as needed.     [name] ketotifen (ZADITOR) 0.025 % ophthalmic solution 1 drop 2 (two) times daily.    [name] linaclotide (LINZESS) 145 MCG CAPS capsule Take 1 capsule (145 mcg total) by mouth daily before breakfast. 30 capsule 2  . lubiprostone (AMITIZA) 24 MCG capsule TAKE 1 CAPSULE BY MOUTH TWO TIMES DAILY WITH A MEAL 180 capsule 1  . MAGNESIUM PO Take by mouth.    . memantine (NAMENDA) 10 MG tablet TAKE 1 TABLET BY MOUTH TWO TIMES DAILY 180 tablet 2  . mirabegron ER (MYRBETRIQ) 50 MG TB24 tablet Take 1 tablet (50 mg total) by mouth daily. 90 tablet 2  . Multiple Vitamins-Minerals (CENTRUM PO) Take one by mouth daily    . Omega-3 Fatty Acids (FISH OIL) 600 MG CAPS Take by mouth.    . polyethylene glycol (MIRALAX / GLYCOLAX) packet Take 17 g by mouth daily.    . ranitidine (ZANTAC) 150 MG tablet Take 1 tablet  (150 mg total) by mouth 2 (two) times daily. 60 tablet 5  . venlafaxine XR (EFFEXOR-XR) 150 MG 24 hr capsule Take 1 capsule (150 mg total) by mouth daily. 90 capsule 1   No current facility-administered medications on file prior to visit.     Allergies  Allergen Reactions  . Sulfa Antibiotics Rash    Past Medical History:  Diagnosis Date  . Anxiety   . Arthritis   . Belching   . Bladder disorder    OVERACTIVE  . Bowel dysfunction    BLOCKAGE  . Constipation   . Depression   . Diverticulitis   . Fibromyalgia   . GERD (gastroesophageal reflux disease)   . Hyperlipidemia   . IBS (irritable bowel syndrome)   . Internal hemorrhoids   . Memory deficits   . Pneumonia 11/18/12  . Urinary incontinence     Past Surgical History:  Procedure Laterality Date  . BLADDER SUSPENSION  2004, 2012  . CATARACT EXTRACTION W/PHACO Right 08/27/2015   Procedure: CATARACT EXTRACTION PHACO AND INTRAOCULAR LENS PLACEMENT (IOC);  Surgeon: Estill Cotta, MD;  Location: [facility] ORS;  Service: Ophthalmology;  Laterality: Right;  [place]   1:00.2 AP%  22.5 CDE  23.67 fluid casette lot #[id number]  exp05/31/2018  . CATARACT EXTRACTION W/PHACO Left 10/15/2015   Procedure: CATARACT EXTRACTION PHACO AND INTRAOCULAR  LENS PLACEMENT (IOC);  Surgeon: Estill Cotta, MD;  Location: [facility] ORS;  Service: Ophthalmology;  Laterality: Left;  [place] 01:07 AP% 18.1 CDE 21.57 fluid pack lot # [id number]  . CHOLECYSTECTOMY    . TONSILLECTOMY  1947    Family History  Problem Relation Age of Onset  . Cancer Father   . Diabetes Father   . Heart disease Father   . Heart disease Mother   . Breast cancer Sister 51  . Colon cancer Neg Hx   . Esophageal cancer Neg Hx   . Rectal cancer Neg Hx   . Stomach cancer Neg Hx   . Bladder Cancer Neg Hx   . Kidney cancer Neg Hx     Social History   Socioeconomic History  . Marital status: Married    Spouse name: Not on file  . Number of children: Not on file  . Years of  education: Not on file  . Highest education level: Not on file  Social Needs  . Financial resource strain: Not on file  . Food insecurity - worry: Not on file  . Food insecurity - inability: Not on file  . Transportation needs - medical: Not on file  . Transportation needs - non-medical: Not on file  Occupational History  . Occupation: Retired  Tobacco Use  . Smoking status: Never Smoker  . Smokeless tobacco: Never Used  Substance and Sexual Activity  . Alcohol use: Yes    Comment: OCCAS  . Drug use: No  . Sexual activity: Not Currently  Other Topics Concern  . Not on file  Social History Narrative   Recently moved with her husband to [place] from [place].   Husband is a retired Pharmacist, community.   No children.   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   The PMH, PSH, Social History, Family History, Medications, and allergies have been reviewed in [facility], and have been updated if relevant.   Review of Systems  Constitutional: Negative.   Respiratory: Negative.   Cardiovascular: Negative.   Gastrointestinal: Positive for nausea.  Genitourinary: Positive for frequency. Negative for decreased urine volume, dysuria, enuresis, flank pain, genital sores, hematuria, menstrual problem, pelvic pain, urgency, vaginal bleeding, vaginal discharge and vaginal pain.  Musculoskeletal: Negative.   Neurological: Positive for dizziness.  Hematological: Negative.   Psychiatric/Behavioral: Negative.   All other systems reviewed and are negative.      Objective:    BP 118/68 (BP Location: Left Arm, Patient Position: Sitting, Cuff Size: Normal)   Pulse (!) 56   Temp 97.6 F (36.4 C) (Oral)   Ht 5\' 5"  (1.651 m)   Wt 153 lb 6.4 oz (69.6 kg)   SpO2 92%   BMI 25.53 kg/m    Physical Exam  Constitutional: She appears well-developed and well-nourished. No distress.  HENT:  Head: Normocephalic and atraumatic.  Eyes: Conjunctivae are normal.  Cardiovascular: Normal rate.    Pulmonary/Chest: Effort normal.  Musculoskeletal: Normal range of motion.  Neurological:  + horizontal nystagmus with dix hallpike  Skin: Skin is warm and dry. She is not diaphoretic.  Psychiatric: She has a normal mood and affect. Her behavior is normal. Judgment and thought content normal.  Nursing note and vitals reviewed.         Assessment & Plan:   Urinary frequency No Follow-up on file.

## 2017-07-30 NOTE — Assessment & Plan Note (Signed)
Initially here for urinary frequency- UA neg, reassurance provided and will address her OAB on another OV as she is very dizzy and nauseated today. Given zofran in office. Discussed BPV home exercises. Husband will drive her home. Call or return to clinic prn if these symptoms worsen or fail to improve as anticipated. The patient indicates understanding of these issues and agrees with the plan.

## 2017-07-30 NOTE — Patient Instructions (Signed)
Great to see you. Your urine looked okay- no infection. Please rest today, drink fluids.  Let me know how you're feeling.

## 2017-08-03 DIAGNOSIS — H109 Unspecified conjunctivitis: Secondary | ICD-10-CM | POA: Diagnosis not present

## 2017-08-20 ENCOUNTER — Encounter: Payer: Self-pay | Admitting: Family Medicine

## 2017-08-20 ENCOUNTER — Ambulatory Visit (INDEPENDENT_AMBULATORY_CARE_PROVIDER_SITE_OTHER): Payer: Medicare Other | Admitting: Family Medicine

## 2017-08-20 VITALS — BP 102/64 | HR 61 | Temp 97.6°F | Ht 65.0 in | Wt 153.4 lb

## 2017-08-20 DIAGNOSIS — R350 Frequency of micturition: Secondary | ICD-10-CM | POA: Diagnosis not present

## 2017-08-20 DIAGNOSIS — N3946 Mixed incontinence: Secondary | ICD-10-CM

## 2017-08-20 LAB — POCT URINALYSIS DIPSTICK
BILIRUBIN UA: NEGATIVE
Blood, UA: NEGATIVE
GLUCOSE UA: NEGATIVE
Ketones, UA: NEGATIVE
Leukocytes, UA: NEGATIVE
Nitrite, UA: NEGATIVE
Protein, UA: NEGATIVE
Spec Grav, UA: 1.015 (ref 1.010–1.025)
Urobilinogen, UA: 0.2 E.U./dL
pH, UA: 6 (ref 5.0–8.0)

## 2017-08-20 MED ORDER — SOLIFENACIN SUCCINATE 5 MG PO TABS: 5.0000 mg | ORAL_TABLET | Freq: Every day | ORAL | 3 refills | Status: DC

## 2017-08-20 NOTE — Progress Notes (Signed)
Subjective:   Patient ID: Jamie Matthews, female    DOB: December 02, 1940, 77 y.o.   MRN: [id number]  Jamie Matthews is a pleasant 77 y.o. year old female who presents to clinic today with Urinary Frequency (frequency urination/getting worse)  on 08/20/2017  HPI: Urinary frequency/mixed urinary incontinence have been issues for her for years.  Myrbetriq has been helpful, currently taking 50 mg ER daily dosage.  Lately, she feels it has not been as effective. No dysuria. No flank pain. NO hematuria.  Current Outpatient Medications on File Prior to Visit  Medication Sig Dispense Refill  . atorvastatin (LIPITOR) 20 MG tablet TAKE 1 TABLET BY MOUTH ONE TIME DAILY 90 tablet 1  . buPROPion (WELLBUTRIN XL) 150 MG 24 hr tablet Take 1 tablet (150 mg total) by mouth daily. 90 tablet 1  . Calcium Carbonate Antacid (ANTACID PO) Take by mouth as needed.    . desonide (DESOWEN) 0.05 % lotion     . dexlansoprazole (DEXILANT) 60 MG capsule Take 1 capsule (60 mg total) by mouth daily. 30 capsule 3  . diphenhydrAMINE (BENADRYL) 25 MG tablet Take 25 mg by mouth every 8 (eight) hours as needed.     [name] ketotifen (ZADITOR) 0.025 % ophthalmic solution 1 drop 2 (two) times daily.    [name] linaclotide (LINZESS) 145 MCG CAPS capsule Take 1 capsule (145 mcg total) by mouth daily before breakfast. 30 capsule 2  . lubiprostone (AMITIZA) 24 MCG capsule TAKE 1 CAPSULE BY MOUTH TWO TIMES DAILY WITH A MEAL 180 capsule 1  . MAGNESIUM PO Take by mouth.    . memantine (NAMENDA) 10 MG tablet TAKE 1 TABLET BY MOUTH TWO TIMES DAILY 180 tablet 2  . mirabegron ER (MYRBETRIQ) 50 MG TB24 tablet Take 1 tablet (50 mg total) by mouth daily. 90 tablet 2  . Multiple Vitamins-Minerals (CENTRUM PO) Take one by mouth daily    . Omega-3 Fatty Acids (FISH OIL) 600 MG CAPS Take by mouth.    . polyethylene glycol (MIRALAX / GLYCOLAX) packet Take 17 g by mouth daily.    . ranitidine (ZANTAC) 150 MG tablet Take 1 tablet (150  mg total) by mouth 2 (two) times daily. 60 tablet 5  . venlafaxine XR (EFFEXOR-XR) 150 MG 24 hr capsule Take 1 capsule (150 mg total) by mouth daily. 90 capsule 1   No current facility-administered medications on file prior to visit.     Allergies  Allergen Reactions  . Sulfa Antibiotics Rash    Past Medical History:  Diagnosis Date  . Anxiety   . Arthritis   . Belching   . Bladder disorder    OVERACTIVE  . Bowel dysfunction    BLOCKAGE  . Constipation   . Depression   . Diverticulitis   . Fibromyalgia   . GERD (gastroesophageal reflux disease)   . Hyperlipidemia   . IBS (irritable bowel syndrome)   . Internal hemorrhoids   . Memory deficits   . Pneumonia 11/18/12  . Urinary incontinence     Past Surgical History:  Procedure Laterality Date  . BLADDER SUSPENSION  2004, 2012  . CATARACT EXTRACTION W/PHACO Right 08/27/2015   Procedure: CATARACT EXTRACTION PHACO AND INTRAOCULAR LENS PLACEMENT (IOC);  Surgeon: Estill Cotta, MD;  Location: [facility] ORS;  Service: Ophthalmology;  Laterality: Right;  [place]   1:00.2 AP%  22.5 CDE  23.67 fluid casette lot #[id number]  exp05/31/2018  . CATARACT EXTRACTION W/PHACO Left 10/15/2015   Procedure: CATARACT EXTRACTION PHACO AND INTRAOCULAR LENS  PLACEMENT (IOC);  Surgeon: Estill Cotta, MD;  Location: [facility] ORS;  Service: Ophthalmology;  Laterality: Left;  [place] 01:07 AP% 18.1 CDE 21.57 fluid pack lot # [id number]  . CHOLECYSTECTOMY    . TONSILLECTOMY  1947    Family History  Problem Relation Age of Onset  . Cancer Father   . Diabetes Father   . Heart disease Father   . Heart disease Mother   . Breast cancer Sister 33  . Colon cancer Neg Hx   . Esophageal cancer Neg Hx   . Rectal cancer Neg Hx   . Stomach cancer Neg Hx   . Bladder Cancer Neg Hx   . Kidney cancer Neg Hx     Social History   Socioeconomic History  . Marital status: Married    Spouse name: Not on file  . Number of children: Not on file  . Years of education:  Not on file  . Highest education level: Not on file  Occupational History  . Occupation: Retired  Scientific laboratory technician  . Financial resource strain: Not on file  . Food insecurity:    Worry: Not on file    Inability: Not on file  . Transportation needs:    Medical: Not on file    Non-medical: Not on file  Tobacco Use  . Smoking status: Never Smoker  . Smokeless tobacco: Never Used  Substance and Sexual Activity  . Alcohol use: Yes    Comment: OCCAS  . Drug use: No  . Sexual activity: Not Currently  Lifestyle  . Physical activity:    Days per week: Not on file    Minutes per session: Not on file  . Stress: Not on file  Relationships  . Social connections:    Talks on phone: Not on file    Gets together: Not on file    Attends religious service: Not on file    Active member of club or organization: Not on file    Attends meetings of clubs or organizations: Not on file    Relationship status: Not on file  . Intimate partner violence:    Fear of current or ex partner: Not on file    Emotionally abused: Not on file    Physically abused: Not on file    Forced sexual activity: Not on file  Other Topics Concern  . Not on file  Social History Narrative   Recently moved with her husband to [place] from [place].   Husband is a retired Pharmacist, community.   No children.   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   The PMH, PSH, Social History, Family History, Medications, and allergies have been reviewed in [facility], and have been updated if relevant.   Review of Systems  Constitutional: Negative.   Gastrointestinal: Negative.   Genitourinary: Positive for frequency and urgency. Negative for decreased urine volume, difficulty urinating, dyspareunia, dysuria, enuresis, flank pain, genital sores, hematuria, menstrual problem, pelvic pain, vaginal bleeding, vaginal discharge and vaginal pain.  Musculoskeletal: Negative.   All other systems reviewed and are negative.      Objective:      BP 102/64   Pulse 61   Temp 97.6 F (36.4 C)   Ht 5\' 5"  (1.651 m)   Wt 153 lb 6.4 oz (69.6 kg)   SpO2 97%   BMI 25.53 kg/m    Physical Exam  Constitutional: She is oriented to person, place, and time. She appears well-developed and well-nourished.  No distress.  HENT:  Head: Normocephalic and atraumatic.  Eyes: Conjunctivae are normal.  Cardiovascular: Normal rate.  Pulmonary/Chest: Effort normal.  Abdominal: Soft. Bowel sounds are normal. She exhibits no distension. There is no tenderness.  Musculoskeletal: Normal range of motion.  Neurological: She is alert and oriented to person, place, and time. No cranial nerve deficit.  Skin: Skin is warm and dry. She is not diaphoretic.  Psychiatric: She has a normal mood and affect. Her behavior is normal. Judgment and thought content normal.  Nursing note and vitals reviewed.         Assessment & Plan:   Mixed stress and urge urinary incontinence No follow-ups on file.

## 2017-08-20 NOTE — Assessment & Plan Note (Signed)
Deteriorated. UA neg for infection as suspected. Discussed tx options- add low dose vesicare to current (maximum dose) of Myrbetriq. Call or return to clinic prn if these symptoms worsen or fail to improve as anticipated. The patient indicates understanding of these issues and agrees with the plan.

## 2017-08-20 NOTE — Patient Instructions (Signed)
Great to see you. We are adding Vesicare 5 mg daily to your myrbetriq. Please keep me updated.

## 2017-08-21 ENCOUNTER — Other Ambulatory Visit: Payer: Self-pay | Admitting: Family Medicine

## 2017-08-31 ENCOUNTER — Telehealth: Payer: Self-pay

## 2017-08-31 ENCOUNTER — Telehealth: Payer: Self-pay | Admitting: Family Medicine

## 2017-08-31 NOTE — Telephone Encounter (Signed)
Copied from [place] [id number]. Topic: Quick Communication - See Telephone Encounter >> Aug 31, 2017  3:21 PM Ether Griffins B wrote: CRM for notification. See Telephone encounter for: 08/31/17. Pt calling in stating [employer] wont cover the vesicare or the amitiza. She would like a PA done for these medications because the medication is working very well. [employer] phone number [phone].

## 2017-08-31 NOTE — Telephone Encounter (Signed)
PA for Amitiza refusted to be completed by GI office/I completed PA via CMM and it was denied/I appealed and it is approved till 4.5.2020/thx dmf

## 2017-08-31 NOTE — Telephone Encounter (Signed)
Pt aware that Amitiza is approved and that I will get started on the Vesicare PA/thx dmf

## 2017-09-01 NOTE — Telephone Encounter (Signed)
PA for Vesicare 5mg  1qd initiated via CMM/thx dmf

## 2017-09-14 ENCOUNTER — Other Ambulatory Visit: Payer: Self-pay | Admitting: Family Medicine

## 2017-09-16 DIAGNOSIS — L578 Other skin changes due to chronic exposure to nonionizing radiation: Secondary | ICD-10-CM | POA: Diagnosis not present

## 2017-09-16 DIAGNOSIS — L720 Epidermal cyst: Secondary | ICD-10-CM | POA: Diagnosis not present

## 2017-09-16 DIAGNOSIS — L821 Other seborrheic keratosis: Secondary | ICD-10-CM | POA: Diagnosis not present

## 2017-09-16 DIAGNOSIS — L820 Inflamed seborrheic keratosis: Secondary | ICD-10-CM | POA: Diagnosis not present

## 2017-09-16 NOTE — Telephone Encounter (Signed)
Expedited appeal completed via CMM/will call pt with outcome/thx dmf

## 2017-09-16 NOTE — Telephone Encounter (Signed)
[phone] Please call pt back with status of PA

## 2017-09-22 NOTE — Telephone Encounter (Signed)
Called pt and advised her that although I made great effort to get the Vesicare approved it was denied/I advised her to call the insurance company and speak with a supervisor and express the need for this/I also advised her that the [place] was approved and I had faxed the approval letter to the pharmacy and will mail her a copy/thx dmf

## 2017-09-28 ENCOUNTER — Encounter: Payer: Self-pay | Admitting: Family Medicine

## 2017-09-28 ENCOUNTER — Ambulatory Visit (INDEPENDENT_AMBULATORY_CARE_PROVIDER_SITE_OTHER): Payer: Medicare Other | Admitting: Family Medicine

## 2017-09-28 VITALS — BP 90/50 | HR 63 | Temp 98.5°F | Ht 65.0 in | Wt 156.4 lb

## 2017-09-28 DIAGNOSIS — N3946 Mixed incontinence: Secondary | ICD-10-CM | POA: Diagnosis not present

## 2017-09-28 DIAGNOSIS — R5383 Other fatigue: Secondary | ICD-10-CM

## 2017-09-28 LAB — CBC
HEMATOCRIT: 35.4 % — AB (ref 36.0–46.0)
HEMOGLOBIN: 12.1 g/dL (ref 12.0–15.0)
MCHC: 34.3 g/dL (ref 30.0–36.0)
MCV: 96.8 fl (ref 78.0–100.0)
Platelets: 230 10*3/uL (ref 150.0–400.0)
RBC: 3.65 Mil/uL — ABNORMAL LOW (ref 3.87–5.11)
RDW: 13.2 % (ref 11.5–15.5)
WBC: 6.1 10*3/uL (ref 4.0–10.5)

## 2017-09-28 LAB — H. PYLORI ANTIBODY, IGG: H Pylori IgG: NEGATIVE

## 2017-09-28 LAB — VITAMIN D 25 HYDROXY (VIT D DEFICIENCY, FRACTURES): VITD: 43.37 ng/mL (ref 30.00–100.00)

## 2017-09-28 LAB — VITAMIN B12: Vitamin B-12: 574 pg/mL (ref 211–911)

## 2017-09-28 LAB — TSH: TSH: 4.15 u[IU]/mL (ref 0.35–4.50)

## 2017-09-28 MED ORDER — FESOTERODINE FUMARATE ER 4 MG PO TB24: 4.0000 mg | ORAL_TABLET | Freq: Every day | ORAL | 3 refills | Status: DC

## 2017-09-28 NOTE — Assessment & Plan Note (Signed)
Deteriorated. Chronic intermittent issue- likely multifactorial.  She denies feeling depression- "my mood is much better with the warm weather."  Despite PHQ today being 11.  She feels this is higher because she is tired. ?component of chronic fatigue with fibromyalgia which we have discussed in th past as well. Acutely symptoms likely worsened by decreased sleep due to urinary incontinence. Will send in rx for toviaz to take with myrbetiq.  Labs today. Orders Placed This Encounter  Procedures  . CBC  . VITAMIN D 25 Hydroxy (Vit-D Deficiency, Fractures)  . B12  . H. pylori antibody, IgG  . TSH

## 2017-09-28 NOTE — Patient Instructions (Signed)
Great to see you. I will call you with your lab results from today and you can view them online.   

## 2017-09-28 NOTE — Progress Notes (Signed)
Subjective:   Patient ID: Jamie Matthews, female    DOB: 14-Jun-1940, 77 y.o.   MRN: [id number]  Jamie Matthews is a pleasant 77 y.o. year old female who presents to clinic today with Fatigue (Patient is here today C/O lack of energy.  She says that she never feels like doing anything and then when she does do something it causes her to be so exhausted.  The other day she cleaned for 1.5 hours and was wiped out.  She has been sleeping a whole lot.  This has been going on for a few months now.) and Urinary Frequency (Insurance refuses to pay for Vesicare despite my attept at a PA and and appeal.  They want her to try Oxybutinin and Toviaz instead.  Patient would like to know how you feel about that and which one you think would work.  She has been taking the Vesicare but doesn't have much left.)  on 09/28/2017  HPI:  Fatigue- Intermittent, persistent issue.    Insurance is no longer Psychologist, forensic. She is now on Myrbetriq only and it is not as effective.  She feels less fatigued when she can sleep through night and not get up to urinate multiple times.  Lab Results  Component Value Date   WBC 5.9 12/03/2016   HGB 12.6 12/03/2016   HCT 36.7 12/03/2016   MCV 94.3 12/03/2016   PLT 240.0 12/03/2016   Lab Results  Component Value Date   TSH 2.55 10/14/2016     Colonoscopy 02/14/16 Endoscopy 12/02/12  Current Outpatient Medications on File Prior to Visit  Medication Sig Dispense Refill  . atorvastatin (LIPITOR) 20 MG tablet TAKE 1 TABLET BY MOUTH ONE TIME DAILY 90 tablet 1  . buPROPion (WELLBUTRIN XL) 150 MG 24 hr tablet TAKE ONE TABLET EVERY DAY 90 tablet 0  . Calcium Carbonate Antacid (ANTACID PO) Take by mouth as needed.    . desonide (DESOWEN) 0.05 % lotion     . dexlansoprazole (DEXILANT) 60 MG capsule Take 1 capsule (60 mg total) by mouth daily. 30 capsule 3  . diphenhydrAMINE (BENADRYL) 25 MG tablet Take 25 mg by mouth every 8 (eight) hours as needed.      [name] ketotifen (ZADITOR) 0.025 % ophthalmic solution 1 drop 2 (two) times daily.    [name] linaclotide (LINZESS) 145 MCG CAPS capsule Take 1 capsule (145 mcg total) by mouth daily before breakfast. 30 capsule 2  . lubiprostone (AMITIZA) 24 MCG capsule TAKE ONE CAPSULE TWICE A DAY WITH MEALS 60 capsule 3  . MAGNESIUM PO Take by mouth.    . memantine (NAMENDA) 10 MG tablet TAKE 1 TABLET BY MOUTH TWO TIMES DAILY 180 tablet 2  . mirabegron ER (MYRBETRIQ) 50 MG TB24 tablet Take 1 tablet (50 mg total) by mouth daily. 90 tablet 2  . Multiple Vitamins-Minerals (CENTRUM PO) Take one by mouth daily    . Omega-3 Fatty Acids (FISH OIL) 600 MG CAPS Take by mouth.    . polyethylene glycol (MIRALAX / GLYCOLAX) packet Take 17 g by mouth daily.    . ranitidine (ZANTAC) 150 MG tablet Take 1 tablet (150 mg total) by mouth 2 (two) times daily. 60 tablet 5  . solifenacin (VESICARE) 5 MG tablet Take 1 tablet (5 mg total) by mouth daily. 30 tablet 3  . venlafaxine XR (EFFEXOR-XR) 150 MG 24 hr capsule Take 1 capsule (150 mg total) by mouth daily. 90 capsule 1   No current facility-administered medications on file prior  to visit.     Allergies  Allergen Reactions  . Sulfa Antibiotics Rash    Past Medical History:  Diagnosis Date  . Anxiety   . Arthritis   . Belching   . Bladder disorder    OVERACTIVE  . Bowel dysfunction    BLOCKAGE  . Constipation   . Depression   . Diverticulitis   . Fibromyalgia   . GERD (gastroesophageal reflux disease)   . Hyperlipidemia   . IBS (irritable bowel syndrome)   . Internal hemorrhoids   . Memory deficits   . Pneumonia 11/18/12  . Urinary incontinence     Past Surgical History:  Procedure Laterality Date  . BLADDER SUSPENSION  2004, 2012  . CATARACT EXTRACTION W/PHACO Right 08/27/2015   Procedure: CATARACT EXTRACTION PHACO AND INTRAOCULAR LENS PLACEMENT (IOC);  Surgeon: Estill Cotta, MD;  Location: [facility] ORS;  Service: Ophthalmology;  Laterality: Right;  [place]    1:00.2 AP%  22.5 CDE  23.67 fluid casette lot #[id number]  exp05/31/2018  . CATARACT EXTRACTION W/PHACO Left 10/15/2015   Procedure: CATARACT EXTRACTION PHACO AND INTRAOCULAR LENS PLACEMENT (IOC);  Surgeon: Estill Cotta, MD;  Location: [facility] ORS;  Service: Ophthalmology;  Laterality: Left;  [place] 01:07 AP% 18.1 CDE 21.57 fluid pack lot # [id number]  . CHOLECYSTECTOMY    . TONSILLECTOMY  1947    Family History  Problem Relation Age of Onset  . Cancer Father   . Diabetes Father   . Heart disease Father   . Heart disease Mother   . Breast cancer Sister 43  . Colon cancer Neg Hx   . Esophageal cancer Neg Hx   . Rectal cancer Neg Hx   . Stomach cancer Neg Hx   . Bladder Cancer Neg Hx   . Kidney cancer Neg Hx     Social History   Socioeconomic History  . Marital status: Married    Spouse name: Not on file  . Number of children: Not on file  . Years of education: Not on file  . Highest education level: Not on file  Occupational History  . Occupation: Retired  Scientific laboratory technician  . Financial resource strain: Not on file  . Food insecurity:    Worry: Not on file    Inability: Not on file  . Transportation needs:    Medical: Not on file    Non-medical: Not on file  Tobacco Use  . Smoking status: Never Smoker  . Smokeless tobacco: Never Used  Substance and Sexual Activity  . Alcohol use: Yes    Comment: OCCAS  . Drug use: No  . Sexual activity: Not Currently  Lifestyle  . Physical activity:    Days per week: Not on file    Minutes per session: Not on file  . Stress: Not on file  Relationships  . Social connections:    Talks on phone: Not on file    Gets together: Not on file    Attends religious service: Not on file    Active member of club or organization: Not on file    Attends meetings of clubs or organizations: Not on file    Relationship status: Not on file  . Intimate partner violence:    Fear of current or ex partner: Not on file    Emotionally abused: Not on  file    Physically abused: Not on file    Forced sexual activity: Not on file  Other Topics Concern  . Not on file  Social History Narrative  Recently moved with her husband to [place] from [place].   Husband is a retired Pharmacist, community.   No children.   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   The PMH, PSH, Social History, Family History, Medications, and allergies have been reviewed in [facility], and have been updated if relevant.  Review of Systems  Constitutional: Positive for fatigue.  Respiratory: Negative.   Cardiovascular: Negative.   Gastrointestinal: Negative.   Genitourinary: Positive for frequency. Negative for dysuria.  Skin: Negative.   Neurological: Negative.   Hematological: Negative.   Psychiatric/Behavioral: Negative.   All other systems reviewed and are negative.      Objective:    BP (!) 90/50 (BP Location: Left Arm, Patient Position: Sitting, Cuff Size: Normal)   Pulse 63   Temp 98.5 F (36.9 C) (Oral)   Ht 5\' 5"  (0.156 m)   Wt 156 lb 6.4 oz (70.9 kg)   SpO2 96%   BMI 26.03 kg/m   Wt Readings from Last 3 Encounters:  09/28/17 156 lb 6.4 oz (70.9 kg)  08/20/17 153 lb 6.4 oz (69.6 kg)  07/30/17 153 lb 6.4 oz (69.6 kg)    Physical Exam  Constitutional: She is oriented to person, place, and time. She appears well-developed and well-nourished. No distress.  HENT:  Head: Normocephalic and atraumatic.  Eyes: EOM are normal.  Cardiovascular: Normal rate and regular rhythm.  Pulmonary/Chest: Effort normal and breath sounds normal.  Neurological: She is alert and oriented to person, place, and time. No cranial nerve deficit.  Skin: Skin is warm. She is not diaphoretic.  Psychiatric: She has a normal mood and affect. Her behavior is normal. Judgment and thought content normal.  Nursing note and vitals reviewed.         Assessment & Plan:   Other fatigue No follow-ups on file.

## 2017-09-29 ENCOUNTER — Telehealth: Payer: Self-pay | Admitting: Family Medicine

## 2017-09-29 ENCOUNTER — Telehealth: Payer: Self-pay

## 2017-09-29 NOTE — Telephone Encounter (Signed)
LMOVM stating that labs look great and this is very reassuring/if needs any details may call back/PEC ok to reiterate this and give results if pt calls/thx dmf

## 2017-09-29 NOTE — Telephone Encounter (Signed)
-----   Message from Lucille Passy, MD sent at 09/29/2017  7:53 AM EDT ----- Labs are all very reassuring.

## 2017-09-29 NOTE — Telephone Encounter (Signed)
Copied from [place] [phone]. Topic: Quick Communication - See Telephone Encounter >> Sep 29, 2017 12:09 PM Ether Griffins B wrote: CRM for notification. See Telephone encounter for: 09/29/17.  [facility] calling to verify if pt is suppose to be taking both lubiprostone (AMITIZA) 24 MCG capsule and linaclotide (LINZESS) 145 MCG CAPS capsule. CB# [phone]

## 2017-10-01 NOTE — Telephone Encounter (Signed)
Spoke with [facility], they said that PA needs to be done for Linzess and they will contact GI doctor for this.   Pt is aware.

## 2017-10-12 DIAGNOSIS — M79674 Pain in right toe(s): Secondary | ICD-10-CM | POA: Diagnosis not present

## 2017-10-12 DIAGNOSIS — M2041 Other hammer toe(s) (acquired), right foot: Secondary | ICD-10-CM | POA: Diagnosis not present

## 2017-10-22 NOTE — Progress Notes (Deleted)
Subjective:   Jamie Matthews is a 77 y.o. female who presents for Medicare Annual (Subsequent) preventive examination.  Review of Systems: No ROS.  Medicare Wellness Visit. Additional risk factors are reflected in the social history.    Sleep patterns:  Home Safety/Smoke Alarms: Feels safe in home. Smoke alarms in place.  Living environment; residence and Firearm Safety:    Female:         Mammo-active order       Dexa scan-        [facility]-02/14/16. Recall 3 yrs    Objective:     Vitals: There were no vitals taken for this visit.  There is no height or weight on file to calculate BMI.  Advanced Directives 10/14/2016 10/15/2015 08/27/2015 05/28/2015  Does Patient Have a Medical Advance Directive? Yes Yes Yes Yes  Type of Paramedic of [place];Living will [place];Living will [place];Living will -  Does patient want to make changes to medical advance directive? - No - Patient declined No - Patient declined -  Copy of [place] in Chart? No - copy requested No - copy requested No - copy requested -    Tobacco Social History   Tobacco Use  Smoking Status Never Smoker  Smokeless Tobacco Never Used     Counseling given: Not Answered   Clinical Intake:                       Past Medical History:  Diagnosis Date  . Anxiety   . Arthritis   . Belching   . Bladder disorder    OVERACTIVE  . Bowel dysfunction    BLOCKAGE  . Constipation   . Depression   . Diverticulitis   . Fibromyalgia   . GERD (gastroesophageal reflux disease)   . Hyperlipidemia   . IBS (irritable bowel syndrome)   . Internal hemorrhoids   . Memory deficits   . Pneumonia 11/18/12  . Urinary incontinence    Past Surgical History:  Procedure Laterality Date  . BLADDER SUSPENSION  2004, 2012  . CATARACT EXTRACTION W/PHACO Right 08/27/2015   Procedure: CATARACT EXTRACTION PHACO AND INTRAOCULAR LENS  PLACEMENT (IOC);  Surgeon: Estill Cotta, MD;  Location: [facility] OR[facility];  Service: Ophthalmology;  Laterality: Right;  [place]   1:00.2 AP%  22.5 CDE  23.67 fluid casette lot #[id number]  exp05/31/2018  . CATARACT EXTRACTION W/PHACO Left 10/15/2015   Procedure: CATARACT EXTRACTION PHACO AND INTRAOCULAR LENS PLACEMENT (IOC);  Surgeon: Estill Cotta, MD;  Location: [facility] OR[facility];  Service: Ophthalmology;  Laterality: Left;  [place] 01:07 AP% 18.1 CDE 21.57 fluid pack lot # [id number]  . CHOLECYSTECTOMY    . TONSILLECTOMY  1947   Family History  Problem Relation Age of Onset  . Cancer Father   . Diabetes Father   . Heart disease Father   . Heart disease Mother   . Breast cancer Sister 80  . Colon cancer Neg Hx   . Esophageal cancer Neg Hx   . Rectal cancer Neg Hx   . Stomach cancer Neg Hx   . Bladder Cancer Neg Hx   . Kidney cancer Neg Hx    Social History   Socioeconomic History  . Marital status: Married    Spouse name: Not on file  . Number of children: Not on file  . Years of education: Not on file  . Highest education level: Not on file  Occupational History  .  Occupation: Retired  Scientific laboratory technician  . Financial resource strain: Not on file  . Food insecurity:    Worry: Not on file    Inability: Not on file  . Transportation needs:    Medical: Not on file    Non-medical: Not on file  Tobacco Use  . Smoking status: Never Smoker  . Smokeless tobacco: Never Used  Substance and Sexual Activity  . Alcohol use: Yes    Comment: OCCAS  . Drug use: No  . Sexual activity: Not Currently  Lifestyle  . Physical activity:    Days per week: Not on file    Minutes per session: Not on file  . Stress: Not on file  Relationships  . Social connections:    Talks on phone: Not on file    Gets together: Not on file    Attends religious service: Not on file    Active member of club or organization: Not on file    Attends meetings of clubs or organizations: Not on file    Relationship status: Not  on file  Other Topics Concern  . Not on file  Social History Narrative   Recently moved with her husband to [place] from [place].   Husband is a retired Pharmacist, community.   No children.   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    Outpatient Encounter Medications as of 10/28/2017  Medication Sig  . atorvastatin (LIPITOR) 20 MG tablet TAKE 1 TABLET BY MOUTH ONE TIME DAILY  . buPROPion (WELLBUTRIN XL) 150 MG 24 hr tablet TAKE ONE TABLET EVERY DAY  . Calcium Carbonate Antacid (ANTACID PO) Take by mouth as needed.  . desonide (DESOWEN) 0.05 % lotion   . dexlansoprazole (DEXILANT) 60 MG capsule Take 1 capsule (60 mg total) by mouth daily.  . diphenhydrAMINE (BENADRYL) 25 MG tablet Take 25 mg by mouth every 8 (eight) hours as needed.   . fesoterodine (TOVIAZ) 4 MG TB24 tablet Take 1 tablet (4 mg total) by mouth daily.  [name] ketotifen (ZADITOR) 0.025 % ophthalmic solution 1 drop 2 (two) times daily.  [name] linaclotide (LINZESS) 145 MCG CAPS capsule Take 1 capsule (145 mcg total) by mouth daily before breakfast.  . lubiprostone (AMITIZA) 24 MCG capsule TAKE ONE CAPSULE TWICE A DAY WITH MEALS  . MAGNESIUM PO Take by mouth.  . memantine (NAMENDA) 10 MG tablet TAKE 1 TABLET BY MOUTH TWO TIMES DAILY  . mirabegron ER (MYRBETRIQ) 50 MG TB24 tablet Take 1 tablet (50 mg total) by mouth daily.  . Multiple Vitamins-Minerals (CENTRUM PO) Take one by mouth daily  . Omega-3 Fatty Acids (FISH OIL) 600 MG CAPS Take by mouth.  . polyethylene glycol (MIRALAX / GLYCOLAX) packet Take 17 g by mouth daily.  . ranitidine (ZANTAC) 150 MG tablet Take 1 tablet (150 mg total) by mouth 2 (two) times daily.  . solifenacin (VESICARE) 5 MG tablet Take 1 tablet (5 mg total) by mouth daily.  [name] venlafaxine XR (EFFEXOR-XR) 150 MG 24 hr capsule Take 1 capsule (150 mg total) by mouth daily.   No facility-administered encounter medications on file as of 10/28/2017.     Activities of Daily Living No flowsheet data found.  Patient  Care Team: Lucille Passy, MD as PCP - General (Family Medicine) Osborne Oman, MD as Consulting Physician (Obstetrics and Gynecology) Hilarie Fredrickson, Lajuan Lines, MD as Consulting Physician (Gastroenterology)    Assessment:   This is a routine wellness examination for  [place]. Physical assessment deferred to PCP.  Exercise Activities and Dietary recommendations   Diet (meal preparation, eat out, water intake, caffeinated beverages, dairy products, fruits and vegetables): {Desc; diets:16563} Breakfast: Lunch:  Dinner:      Goals    None      Fall Risk Fall Risk  06/09/2017 10/14/2016 07/18/2015 12/13/2013 11/15/2012  Falls in the past year? No Yes No Yes Yes  Number falls in past yr: - 2 or more - 1 1  Comment - - - tripped over shoe -  Injury with Fall? - No - No Yes    Depression Screen PHQ 2/9 Scores 09/28/2017 06/09/2017 10/14/2016 07/18/2015  PHQ - 2 Score 2 0 1 1  PHQ- 9 Score 11 - - -     Cognitive Function MMSE - Mini Mental State Exam 10/14/2016  Orientation to time 5  Orientation to Place 5  Registration 3  Attention/ Calculation 0  Recall 2  Recall-comments pt was unable to recall 1 of 3 words  Language- name 2 objects 0  Language- repeat 1  Language- follow 3 step command 3  Language- read & follow direction 0  Write a sentence 0  Copy design 0  Total score 19        Immunization History  Administered Date(s) Administered  . Influenza Split 02/05/2012  . Influenza,inj,Quad PF,6+ Mos 02/15/2015, 02/24/2017  . Influenza-Unspecified 02/23/2013  . Pneumococcal Conjugate-13 12/13/2013  . Pneumococcal Polysaccharide-23 07/18/2015    Screening Tests Health Maintenance  Topic Date Due  . TETANUS/TDAP  10/15/2026 (Originally 09/01/1959)  . INFLUENZA VACCINE  12/24/2017  . MAMMOGRAM  04/03/2018  . COLONOSCOPY  02/14/2019  . DEXA SCAN  Completed  . PNA vac Low Risk Adult  Completed      Plan:   ***   I have personally reviewed and noted the following in the  patient's chart:   . Medical and social history . Use of alcohol, tobacco or illicit drugs  . Current medications and supplements . Functional ability and status . Nutritional status . Physical activity . Advanced directives . List of other physicians . Hospitalizations, surgeries, and ER visits in previous 12 months . Vitals . Screenings to include cognitive, depression, and falls . Referrals and appointments  In addition, I have reviewed and discussed with patient certain preventive protocols, quality metrics, and best practice recommendations. A written personalized care plan for preventive services as well as general preventive health recommendations were provided to patient.     Shela Nevin, [place]  10/22/2017

## 2017-10-26 ENCOUNTER — Ambulatory Visit: Payer: Self-pay

## 2017-10-27 ENCOUNTER — Other Ambulatory Visit: Payer: Self-pay | Admitting: Internal Medicine

## 2017-10-28 ENCOUNTER — Other Ambulatory Visit: Payer: Self-pay

## 2017-10-28 ENCOUNTER — Ambulatory Visit: Payer: Self-pay | Admitting: Behavioral Health

## 2017-11-02 ENCOUNTER — Ambulatory Visit: Payer: Self-pay | Admitting: Family Medicine

## 2017-11-06 NOTE — Progress Notes (Signed)
Subjective:   Jamie Matthews is a 77 y.o. female who presents for Medicare Annual (Subsequent) preventive examination. Pt is retired Pharmacist, Matthews.  Review of Systems: No ROS.  Medicare Wellness Visit. Additional risk factors are reflected in the social history. Cardiac Risk Factors include: advanced age (>20men, >25 women);dyslipidemia Sleep patterns:  Trouble getting to sleep. Wakes about 3x to urinate. Sleeps 8-9 hrs.  Home Safety/Smoke Alarms: Feels safe in home. Smoke alarms in place.  Living environment; residence and Adult nurse: Live at [facility] with husband. 1 story. Walk-in shower with bench. Emergency pull cords.   Female:        Mammo-utd       Dexa scan- ordered       CCS- due 02/15/19    Eye exam yearly. Dr.Dingledine    Objective:     Vitals: BP 124/64 (BP Location: Left Arm, Patient Position: Sitting, Cuff Size: Normal)   Pulse (!) 56   Ht 5\' 5"  (1.651 m)   Wt 153 lb 3.2 oz (69.5 kg)   SpO2 95%   BMI 25.49 kg/m   Body mass index is 25.49 kg/m.  Advanced Directives 11/11/2017 10/14/2016 10/15/2015 08/27/2015 05/28/2015  Does Patient Have a Medical Advance Directive? Yes Yes Yes Yes Yes  Type of Paramedic of [place];Living will [place];Living will [place];Living will [place];Living will -  Does patient want to make changes to medical advance directive? No - Patient declined - No - Patient declined No - Patient declined -  Copy of [place] in Chart? No - copy requested No - copy requested No - copy requested No - copy requested -    Tobacco Social History   Tobacco Use  Smoking Status Never Smoker  Smokeless Tobacco Never Used     Counseling given: Not Answered   Clinical Intake:     Pain : No/denies pain                 Past Medical History:  Diagnosis Date  . Anxiety   . Arthritis   . Belching   .  Bladder disorder    OVERACTIVE  . Bowel dysfunction    BLOCKAGE  . Constipation   . Depression   . Diverticulitis   . Fibromyalgia   . GERD (gastroesophageal reflux disease)   . Hyperlipidemia   . IBS (irritable bowel syndrome)   . Internal hemorrhoids   . Memory deficits   . Pneumonia 11/18/12  . Urinary incontinence   . Vertigo    Past Surgical History:  Procedure Laterality Date  . BLADDER SUSPENSION  2004, 2012  . CATARACT EXTRACTION W/PHACO Right 08/27/2015   Procedure: CATARACT EXTRACTION PHACO AND INTRAOCULAR LENS PLACEMENT (IOC);  Surgeon: Estill Cotta, MD;  Location: [facility] ORS;  Service: Ophthalmology;  Laterality: Right;  [place]   1:00.2 AP%  22.5 CDE  23.67 fluid casette lot #[id number]  exp05/31/2018  . CATARACT EXTRACTION W/PHACO Left 10/15/2015   Procedure: CATARACT EXTRACTION PHACO AND INTRAOCULAR LENS PLACEMENT (IOC);  Surgeon: Estill Cotta, MD;  Location: [facility] ORS;  Service: Ophthalmology;  Laterality: Left;  [place] 01:07 AP% 18.1 CDE 21.57 fluid pack lot # [id number]  . CHOLECYSTECTOMY    . TONSILLECTOMY  1947   Family History  Problem Relation Age of Onset  . Cancer Father   . Diabetes Father   . Heart disease Father   . Heart disease Mother   . Breast  cancer Sister 42  . Colon cancer Neg Hx   . Esophageal cancer Neg Hx   . Rectal cancer Neg Hx   . Stomach cancer Neg Hx   . Bladder Cancer Neg Hx   . Kidney cancer Neg Hx    Social History   Socioeconomic History  . Marital status: Married    Spouse name: Not on file  . Number of children: Not on file  . Years of education: Not on file  . Highest education level: Not on file  Occupational History  . Occupation: Retired  Scientific laboratory technician  . Financial resource strain: Not on file  . Food insecurity:    Worry: Not on file    Inability: Not on file  . Transportation needs:    Medical: Not on file    Non-medical: Not on file  Tobacco Use  . Smoking status: Never Smoker  . Smokeless tobacco: Never  Used  Substance and Sexual Activity  . Alcohol use: Yes    Comment: OCCAS  . Drug use: No  . Sexual activity: Not Currently  Lifestyle  . Physical activity:    Days per week: Not on file    Minutes per session: Not on file  . Stress: Not on file  Relationships  . Social connections:    Talks on phone: Not on file    Gets together: Not on file    Attends religious service: Not on file    Active member of club or organization: Not on file    Attends meetings of clubs or organizations: Not on file    Relationship status: Not on file  Other Topics Concern  . Not on file  Social History Narrative   Recently moved with her husband to [place] from [place].   Husband is a retired Pharmacist, community.   No children.   She is a retired Pharmacist, Matthews.      She is a DNR.    Outpatient Encounter Medications as of 11/11/2017  Medication Sig  . atorvastatin (LIPITOR) 20 MG tablet TAKE 1 TABLET BY MOUTH ONE TIME DAILY  . buPROPion (WELLBUTRIN XL) 150 MG 24 hr tablet TAKE ONE TABLET EVERY DAY  . Calcium Carbonate Antacid (ANTACID PO) Take by mouth as needed.  . desonide (DESOWEN) 0.05 % lotion   . dexlansoprazole (DEXILANT) 60 MG capsule Take 1 capsule (60 mg total) by mouth daily. NEEDS OFFICE VISIT FOR FURTHER REFILLS  . diphenhydrAMINE (BENADRYL) 25 MG tablet Take 25 mg by mouth every 8 (eight) hours as needed.   [name] ketotifen (ZADITOR) 0.025 % ophthalmic solution 1 drop 2 (two) times daily.  [name] linaclotide (LINZESS) 145 MCG CAPS capsule Take 1 capsule (145 mcg total) by mouth daily before breakfast.  . lubiprostone (AMITIZA) 24 MCG capsule TAKE ONE CAPSULE TWICE A DAY WITH MEALS  . MAGNESIUM PO Take by mouth.  . mirabegron ER (MYRBETRIQ) 50 MG TB24 tablet Take 1 tablet (50 mg total) by mouth daily.  . Multiple Vitamins-Minerals (CENTRUM PO) Take one by mouth daily  . Omega-3 Fatty Acids (FISH OIL) 600 MG CAPS Take by mouth.  . polyethylene glycol (MIRALAX / GLYCOLAX) packet Take 17 g by mouth daily.    . ranitidine (ZANTAC) 150 MG tablet Take 1 tablet (150 mg total) by mouth 2 (two) times daily.  . solifenacin (VESICARE) 5 MG tablet Take 1 tablet (5 mg total) by mouth daily.  [name] venlafaxine XR (EFFEXOR-XR) 150 MG 24 hr capsule Take 1 capsule (150 mg total) by  mouth daily.  . fesoterodine (TOVIAZ) 4 MG TB24 tablet Take 1 tablet (4 mg total) by mouth daily. (Patient not taking: Reported on 11/11/2017)  . memantine (NAMENDA) 10 MG tablet TAKE 1 TABLET BY MOUTH TWO TIMES DAILY (Patient not taking: Reported on 11/11/2017)   No facility-administered encounter medications on file as of 11/11/2017.     Activities of Daily Living In your present state of health, do you have any difficulty performing the following activities: 11/11/2017  Hearing? N  Vision? N  Comment hx cataract sx.  Difficulty concentrating or making decisions? Y  Comment on meds  Walking or climbing stairs? N  Dressing or bathing? N  Doing errands, shopping? N  Preparing Food and eating ? N  Using the Toilet? N  In the past six months, have you accidently leaked urine? Y  Do you have problems with loss of bowel control? N  Managing your Medications? N  Managing your Finances? N  Housekeeping or managing your Housekeeping? N  Some recent data might be hidden    Patient Care Team: Lucille Passy, MD as PCP - General (Family Medicine) Osborne Oman, MD as Consulting Physician (Obstetrics and Gynecology) Hilarie Fredrickson, Lajuan Lines, MD as Consulting Physician (Gastroenterology)    Assessment:   This is a routine wellness examination for [facility]. Physical assessment deferred to PCP.  Exercise Activities and Dietary recommendations Current Exercise Habits: Home exercise routine, Type of exercise: walking, Time (Minutes): 20, Frequency (Times/Week): 2, Weekly Exercise (Minutes/Week): 40, Intensity: Mild, Exercise limited by: None identified Diet (meal preparation, eat out, water intake, caffeinated beverages, dairy products, fruits and  vegetables): pt and husband on weight watchers diet   Goals    . Maintain healthy diet       Fall Risk Fall Risk  11/11/2017 06/09/2017 10/14/2016 07/18/2015 12/13/2013  Falls in the past year? No No Yes No Yes  Number falls in past yr: - - 2 or more - 1  Comment - - - - tripped over shoe  Injury with Fall? - - No - No   Depression Screen PHQ 2/9 Scores 11/11/2017 09/28/2017 06/09/2017 10/14/2016  PHQ - 2 Score 1 2 0 1  PHQ- 9 Score - 11 - -     Cognitive Function MMSE - Mini Mental State Exam 11/11/2017 10/14/2016  Orientation to time 5 5  Orientation to Place 5 5  Registration 3 3  Attention/ Calculation 4 0  Recall 2 2  Recall-comments - pt was unable to recall 1 of 3 words  Language- name 2 objects 2 0  Language- repeat 1 1  Language- follow 3 step command 3 3  Language- read & follow direction 1 0  Write a sentence 1 0  Copy design 1 0  Total score 28 19        Immunization History  Administered Date(s) Administered  . Influenza Split 02/05/2012  . Influenza,inj,Quad PF,6+ Mos 02/15/2015, 02/24/2017  . Influenza-Unspecified 02/23/2013  . Pneumococcal Conjugate-13 12/13/2013  . Pneumococcal Polysaccharide-23 07/18/2015   Screening Tests Health Maintenance  Topic Date Due  . TETANUS/TDAP  10/15/2026 (Originally 09/01/1959)  . INFLUENZA VACCINE  12/24/2017  . MAMMOGRAM  04/03/2018  . COLONOSCOPY  02/14/2019  . DEXA SCAN  Completed  . PNA vac Low Risk Adult  Completed      Plan:     Follow up with Dr.Aron today as scheduled  Please schedule your next medicare wellness visit with me in 1 yr.  Continue to eat heart healthy  diet (full of fruits, vegetables, whole grains, lean protein, water--limit salt, fat, and sugar intake) and increase physical activity as tolerated.  Continue doing brain stimulating activities (puzzles, reading, adult coloring books, staying active) to keep memory sharp.   Bring a copy of your living will and/or healthcare power of attorney to  your next office visit.  I have ordered your bone density scan. Please schedule.  I have personally reviewed and noted the following in the patient's chart:   . Medical and social history . Use of alcohol, tobacco or illicit drugs  . Current medications and supplements . Functional ability and status . Nutritional status . Physical activity . Advanced directives . List of other physicians . Hospitalizations, surgeries, and ER visits in previous 12 months . Vitals . Screenings to include cognitive, depression, and falls . Referrals and appointments  In addition, I have reviewed and discussed with patient certain preventive protocols, quality metrics, and best practice recommendations. A written personalized care plan for preventive services as well as general preventive health recommendations were provided to patient.     Shela Nevin, [place]  11/11/2017

## 2017-11-11 ENCOUNTER — Encounter: Payer: Self-pay | Admitting: Behavioral Health

## 2017-11-11 ENCOUNTER — Encounter: Payer: Self-pay | Admitting: Family Medicine

## 2017-11-11 ENCOUNTER — Ambulatory Visit (INDEPENDENT_AMBULATORY_CARE_PROVIDER_SITE_OTHER): Payer: Medicare Other | Admitting: Behavioral Health

## 2017-11-11 ENCOUNTER — Ambulatory Visit (INDEPENDENT_AMBULATORY_CARE_PROVIDER_SITE_OTHER): Payer: Medicare Other | Admitting: Family Medicine

## 2017-11-11 VITALS — BP 124/64 | HR 56 | Ht 65.0 in | Wt 153.2 lb

## 2017-11-11 VITALS — BP 124/64 | HR 72 | Temp 97.7°F | Ht 65.0 in | Wt 153.2 lb

## 2017-11-11 DIAGNOSIS — N3946 Mixed incontinence: Secondary | ICD-10-CM

## 2017-11-11 DIAGNOSIS — Z0000 Encounter for general adult medical examination without abnormal findings: Secondary | ICD-10-CM

## 2017-11-11 DIAGNOSIS — E7800 Pure hypercholesterolemia, unspecified: Secondary | ICD-10-CM | POA: Diagnosis not present

## 2017-11-11 DIAGNOSIS — Z780 Asymptomatic menopausal state: Secondary | ICD-10-CM

## 2017-11-11 DIAGNOSIS — F339 Major depressive disorder, recurrent, unspecified: Secondary | ICD-10-CM

## 2017-11-11 DIAGNOSIS — K5909 Other constipation: Secondary | ICD-10-CM | POA: Diagnosis not present

## 2017-11-11 LAB — COMPREHENSIVE METABOLIC PANEL
ALK PHOS: 78 U/L (ref 39–117)
ALT: 31 U/L (ref 0–35)
AST: 20 U/L (ref 0–37)
Albumin: 4.2 g/dL (ref 3.5–5.2)
BUN: 32 mg/dL — ABNORMAL HIGH (ref 6–23)
CHLORIDE: 103 meq/L (ref 96–112)
CO2: 33 meq/L — AB (ref 19–32)
Calcium: 9.5 mg/dL (ref 8.4–10.5)
Creatinine, Ser: 0.98 mg/dL (ref 0.40–1.20)
GFR: 58.16 mL/min — AB (ref >60.00)
Glucose, Bld: 97 mg/dL (ref 70–99)
POTASSIUM: 4.5 meq/L (ref 3.5–5.1)
Sodium: 142 mEq/L (ref 135–145)
TOTAL PROTEIN: 6.5 g/dL (ref 6.0–8.3)
Total Bilirubin: 0.6 mg/dL (ref 0.2–1.2)

## 2017-11-11 LAB — LIPID PANEL
CHOL/HDL RATIO: 3
Cholesterol: 168 mg/dL (ref 0–200)
HDL: 53.1 mg/dL (ref >39.00)
LDL CALC: 100 mg/dL — AB (ref 0–99)
NONHDL: 114.84
Triglycerides: 73 mg/dL (ref 0.0–149.0)
VLDL: 14.6 mg/dL (ref 0.0–40.0)

## 2017-11-11 NOTE — Assessment & Plan Note (Signed)
Improved control with current rxs.

## 2017-11-11 NOTE — Progress Notes (Signed)
Subjective:   Patient ID: Jamie Matthews, female    DOB: [birth year], 77 y.o.   MRN: [id number]  Jamie Matthews is a pleasant 77 y.o. year old female who presents to clinic today with Follow-up (Patient is here today for a F/U.  She had an AWV this am.  She is currently fasting.  )  on 11/11/2017  HPI:  Medicare annual wellness visit with [facility] this morning. Note reviewed.  Saw urologist on 09/25/16, Dr. Pilar Jarvis last year from [facility].  She has not returned since.  Started on Vesicare 5 mg daily.  Advised follow up in 6- 8 weeks.In March, re added Vesicare to [facility] which she was already taking. Vesicare seems to be helping.    HLD- taking lipitor 20 mg daily.  Lab Results  Component Value Date   CHOL 164 10/14/2016   HDL 46.40 10/14/2016   LDLCALC 100 (H) 10/14/2016   LDLDIRECT 175.9 02/05/2012   TRIG 88.0 10/14/2016   CHOLHDL 4 10/14/2016   Lab Results  Component Value Date   ALT 17 10/14/2016   AST 17 10/14/2016   ALKPHOS 70 10/14/2016   BILITOT 0.4 10/14/2016     Depression- chronic issue and currently symptoms are well controlled with Effexor and Wellbutrin. Trazodone is helping with insomnia.  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  Per pt, could not tolerate other medications.  Current Outpatient Medications on File Prior to Visit  Medication Sig Dispense Refill  . atorvastatin (LIPITOR) 20 MG tablet TAKE 1 TABLET BY MOUTH ONE TIME DAILY 90 tablet 1  . buPROPion (WELLBUTRIN XL) 150 MG 24 hr tablet TAKE ONE TABLET EVERY DAY 90 tablet 0  . Calcium Carbonate Antacid (ANTACID PO) Take by mouth as needed.    . desonide (DESOWEN) 0.05 % lotion     . dexlansoprazole (DEXILANT) 60 MG capsule Take 1 capsule (60 mg total) by mouth daily. NEEDS OFFICE VISIT FOR FURTHER REFILLS 30 capsule 0  . diphenhydrAMINE (BENADRYL) 25 MG tablet Take 25 mg by mouth every 8 (eight) hours as needed.     . fesoterodine (TOVIAZ) 4 MG TB24 tablet Take 1 tablet (4 mg total) by  mouth daily. 30 tablet 3  . ketotifen (ZADITOR) 0.025 % ophthalmic solution 1 drop 2 (two) times daily.    [name] linaclotide (LINZESS) 145 MCG CAPS capsule Take 1 capsule (145 mcg total) by mouth daily before breakfast. 30 capsule 2  . lubiprostone (AMITIZA) 24 MCG capsule TAKE ONE CAPSULE TWICE A DAY WITH MEALS 60 capsule 3  . MAGNESIUM PO Take by mouth.    . memantine (NAMENDA) 10 MG tablet TAKE 1 TABLET BY MOUTH TWO TIMES DAILY 180 tablet 2  . mirabegron ER (MYRBETRIQ) 50 MG TB24 tablet Take 1 tablet (50 mg total) by mouth daily. 90 tablet 2  . Multiple Vitamins-Minerals (CENTRUM PO) Take one by mouth daily    . Omega-3 Fatty Acids (FISH OIL) 600 MG CAPS Take by mouth.    . polyethylene glycol (MIRALAX / GLYCOLAX) packet Take 17 g by mouth daily.    . ranitidine (ZANTAC) 150 MG tablet Take 1 tablet (150 mg total) by mouth 2 (two) times daily. 60 tablet 5  . solifenacin (VESICARE) 5 MG tablet Take 1 tablet (5 mg total) by mouth daily. 30 tablet 3  . venlafaxine XR (EFFEXOR-XR) 150 MG 24 hr capsule Take 1 capsule (150 mg total) by mouth daily. 90 capsule 1   No current facility-administered medications on file prior to visit.  Allergies  Allergen Reactions  . Sulfa Antibiotics Rash    Past Medical History:  Diagnosis Date  . Anxiety   . Arthritis   . Belching   . Bladder disorder    OVERACTIVE  . Bowel dysfunction    BLOCKAGE  . Constipation   . Depression   . Diverticulitis   . Fibromyalgia   . GERD (gastroesophageal reflux disease)   . Hyperlipidemia   . IBS (irritable bowel syndrome)   . Internal hemorrhoids   . Memory deficits   . Pneumonia 11/18/12  . Urinary incontinence   . Vertigo     Past Surgical History:  Procedure Laterality Date  . BLADDER SUSPENSION  2004, 2012  . CATARACT EXTRACTION W/PHACO Right 08/27/2015   Procedure: CATARACT EXTRACTION PHACO AND INTRAOCULAR LENS PLACEMENT (IOC);  Surgeon: Estill Cotta, MD;  Location: [facility] ORS;  Service:  Ophthalmology;  Laterality: Right;  [place]   1:00.2 AP%  22.5 CDE  23.67 fluid casette lot #[id number]  exp05/31/2018  . CATARACT EXTRACTION W/PHACO Left 10/15/2015   Procedure: CATARACT EXTRACTION PHACO AND INTRAOCULAR LENS PLACEMENT (IOC);  Surgeon: Estill Cotta, MD;  Location: [facility] ORS;  Service: Ophthalmology;  Laterality: Left;  [place] 01:07 AP% 18.1 CDE 21.57 fluid pack lot # [id number]  . CHOLECYSTECTOMY    . TONSILLECTOMY  1947    Family History  Problem Relation Age of Onset  . Cancer Father   . Diabetes Father   . Heart disease Father   . Heart disease Mother   . Breast cancer Sister 81  . Colon cancer Neg Hx   . Esophageal cancer Neg Hx   . Rectal cancer Neg Hx   . Stomach cancer Neg Hx   . Bladder Cancer Neg Hx   . Kidney cancer Neg Hx     Social History   Socioeconomic History  . Marital status: Married    Spouse name: Not on file  . Number of children: Not on file  . Years of education: Not on file  . Highest education level: Not on file  Occupational History  . Occupation: Retired  Scientific laboratory technician  . Financial resource strain: Not on file  . Food insecurity:    Worry: Not on file    Inability: Not on file  . Transportation needs:    Medical: Not on file    Non-medical: Not on file  Tobacco Use  . Smoking status: Never Smoker  . Smokeless tobacco: Never Used  Substance and Sexual Activity  . Alcohol use: Yes    Comment: OCCAS  . Drug use: No  . Sexual activity: Not Currently  Lifestyle  . Physical activity:    Days per week: Not on file    Minutes per session: Not on file  . Stress: Not on file  Relationships  . Social connections:    Talks on phone: Not on file    Gets together: Not on file    Attends religious service: Not on file    Active member of club or organization: Not on file    Attends meetings of clubs or organizations: Not on file    Relationship status: Not on file  . Intimate partner violence:    Fear of current or ex partner: Not  on file    Emotionally abused: Not on file    Physically abused: Not on file    Forced sexual activity: Not on file  Other Topics Concern  . Not on file  Social History Narrative  Recently moved with her husband to [place] from [place].   Husband is a retired Pharmacist, community.   No children.   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   The PMH, PSH, Social History, Family History, Medications, and allergies have been reviewed in [facility], and have been updated if relevant.   Review of Systems  Constitutional: Positive for fatigue. Negative for fever.  HENT: Negative.   Eyes: Negative.   Respiratory: Negative.   Cardiovascular: Negative.   Gastrointestinal: Negative.   Endocrine: Negative.   Genitourinary: Negative.   Musculoskeletal: Positive for arthralgias.  Allergic/Immunologic: Negative.   Neurological: Negative.   Hematological: Negative.   Psychiatric/Behavioral: Negative.   All other systems reviewed and are negative.      Objective:    BP 124/64 (BP Location: Left Arm, Patient Position: Sitting, Cuff Size: Normal)   Pulse 72   Temp 97.7 F (36.5 C) (Oral)   Ht 5\' 5"  (1.651 m)   Wt 153 lb 3.2 oz (69.5 kg)   SpO2 96%   BMI 25.49 kg/m   Wt Readings from Last 3 Encounters:  11/11/17 153 lb 3.2 oz (69.5 kg)  11/11/17 153 lb 3.2 oz (69.5 kg)  09/28/17 156 lb 6.4 oz (70.9 kg)    Physical Exam   General:  Well-developed,well-nourished,in no acute distress; alert,appropriate and cooperative throughout examination Head:  normocephalic and atraumatic.   Eyes:  vision grossly intact, PERRL Ears:  R ear normal and L ear normal externally, TMs clear bilaterally Nose:  no external deformity.   Mouth:  good dentition.   Neck:  No deformities, masses, or tenderness noted. Lungs:  Normal respiratory effort, chest expands symmetrically. Lungs are clear to auscultation, no crackles or wheezes. Heart:  Normal rate and regular rhythm. S1 and S2 normal without gallop,  murmur, click, rub or other extra sounds. Abdomen:  Bowel sounds positive,abdomen soft and non-tender without masses, organomegaly or hernias noted. Msk:  No deformity or scoliosis noted of thoracic or lumbar spine.   Extremities:  No clubbing, cyanosis, edema, or deformity noted with normal full range of motion of all joints.   Neurologic:  alert & oriented X3 and gait normal.   Skin:  Intact without suspicious lesions or rashes Psych:  Cognition and judgment appear intact. Alert and cooperative with normal attention span and concentration. No apparent delusions, illusions, hallucinations       Assessment & Plan:   Chronic constipation  Depression, recurrent (HCC)  Pure hypercholesterolemia - Plan: Lipid panel, Comprehensive metabolic panel, CANCELED: TSH  Mixed stress and urge urinary incontinence No follow-ups on file.

## 2017-11-11 NOTE — Assessment & Plan Note (Signed)
Improved

## 2017-11-11 NOTE — Progress Notes (Signed)
I reviewed health advisor's note, was available for consultation, and agree with documentation and plan.  

## 2017-11-11 NOTE — Patient Instructions (Signed)
Follow up with Dr.Aron today as scheduled  Please schedule your next medicare wellness visit with me in 1 yr.  Continue to eat heart healthy diet (full of fruits, vegetables, whole grains, lean protein, water--limit salt, fat, and sugar intake) and increase physical activity as tolerated.  Continue doing brain stimulating activities (puzzles, reading, adult coloring books, staying active) to keep memory sharp.   Bring a copy of your living will and/or healthcare power of attorney to your next office visit.  I have ordered your bone density scan. Please schedule.   [name] , Thank you for taking time to come for your Medicare Wellness Visit. I appreciate your ongoing commitment to your health goals. Please review the following plan we discussed and let me know if I can assist you in the future.   These are the goals we discussed: Goals    . Maintain healthy diet       This is a list of the screening recommended for you and due dates:  Health Maintenance  Topic Date Due  . Tetanus Vaccine  10/15/2026*  . Flu Shot  12/24/2017  . Mammogram  04/03/2018  . Colon Cancer Screening  02/14/2019  . DEXA scan (bone density measurement)  Completed  . Pneumonia vaccines  Completed  *Topic was postponed. The date shown is not the original due date.    Health Maintenance for Postmenopausal Women Menopause is a normal process in which your reproductive ability comes to an end. This process happens gradually over a span of months to years, usually between the ages of 51 and 78. Menopause is complete when you have missed 12 consecutive menstrual periods. It is important to talk with your health care provider about some of the most common conditions that affect postmenopausal women, such as heart disease, cancer, and bone loss (osteoporosis). Adopting a healthy lifestyle and getting preventive care can help to promote your health and wellness. Those actions can also lower your chances of  developing some of these common conditions. What should I know about menopause? During menopause, you may experience a number of symptoms, such as:  Moderate-to-severe hot flashes.  Night sweats.  Decrease in sex drive.  Mood swings.  Headaches.  Tiredness.  Irritability.  Memory problems.  Insomnia.  Choosing to treat or not to treat menopausal changes is an individual decision that you make with your health care provider. What should I know about hormone replacement therapy and supplements? Hormone therapy products are effective for treating symptoms that are associated with menopause, such as hot flashes and night sweats. Hormone replacement carries certain risks, especially as you become older. If you are thinking about using estrogen or estrogen with progestin treatments, discuss the benefits and risks with your health care provider. What should I know about heart disease and stroke? Heart disease, heart attack, and stroke become more likely as you age. This may be due, in part, to the hormonal changes that your body experiences during menopause. These can affect how your body processes dietary fats, triglycerides, and cholesterol. Heart attack and stroke are both medical emergencies. There are many things that you can do to help prevent heart disease and stroke:  Have your blood pressure checked at least every 1-2 years. High blood pressure causes heart disease and increases the risk of stroke.  If you are 75-93 years old, ask your health care provider if you should take aspirin to prevent a heart attack or a stroke.  Do not use any tobacco products, including  cigarettes, chewing tobacco, or electronic cigarettes. If you need help quitting, ask your health care provider.  It is important to eat a healthy diet and maintain a healthy weight. ? Be sure to include plenty of vegetables, fruits, low-fat dairy products, and lean protein. ? Avoid eating foods that are high in solid  fats, added sugars, or salt (sodium).  Get regular exercise. This is one of the most important things that you can do for your health. ? Try to exercise for at least 150 minutes each week. The type of exercise that you do should increase your heart rate and make you sweat. This is known as moderate-intensity exercise. ? Try to do strengthening exercises at least twice each week. Do these in addition to the moderate-intensity exercise.  Know your numbers.Ask your health care provider to check your cholesterol and your blood glucose. Continue to have your blood tested as directed by your health care provider.  What should I know about cancer screening? There are several types of cancer. Take the following steps to reduce your risk and to catch any cancer development as early as possible. Breast Cancer  Practice breast self-awareness. ? This means understanding how your breasts normally appear and feel. ? It also means doing regular breast self-exams. Let your health care provider know about any changes, no matter how small.  If you are 43 or older, have a clinician do a breast exam (clinical breast exam or CBE) every year. Depending on your age, family history, and medical history, it may be recommended that you also have a yearly breast X-ray (mammogram).  If you have a family history of breast cancer, talk with your health care provider about genetic screening.  If you are at high risk for breast cancer, talk with your health care provider about having an MRI and a mammogram every year.  Breast cancer (BRCA) gene test is recommended for women who have family members with BRCA-related cancers. Results of the assessment will determine the need for genetic counseling and BRCA1 and for BRCA2 testing. BRCA-related cancers include these types: ? Breast. This occurs in males or females. ? Ovarian. ? Tubal. This may also be called fallopian tube cancer. ? Cancer of the abdominal or pelvic lining  (peritoneal cancer). ? Prostate. ? Pancreatic.  Cervical, Uterine, and Ovarian Cancer Your health care provider may recommend that you be screened regularly for cancer of the pelvic organs. These include your ovaries, uterus, and vagina. This screening involves a pelvic exam, which includes checking for microscopic changes to the surface of your cervix (Pap test).  For women ages 21-65, health care providers may recommend a pelvic exam and a Pap test every three years. For women ages 65-65, they may recommend the Pap test and pelvic exam, combined with testing for human papilloma virus (HPV), every five years. Some types of HPV increase your risk of cervical cancer. Testing for HPV may also be done on women of any age who have unclear Pap test results.  Other health care providers may not recommend any screening for nonpregnant women who are considered low risk for pelvic cancer and have no symptoms. Ask your health care provider if a screening pelvic exam is right for you.  If you have had past treatment for cervical cancer or a condition that could lead to cancer, you need Pap tests and screening for cancer for at least 20 years after your treatment. If Pap tests have been discontinued for you, your risk factors (such as  having a new sexual partner) need to be reassessed to determine if you should start having screenings again. Some women have medical problems that increase the chance of getting cervical cancer. In these cases, your health care provider may recommend that you have screening and Pap tests more often.  If you have a family history of uterine cancer or ovarian cancer, talk with your health care provider about genetic screening.  If you have vaginal bleeding after reaching menopause, tell your health care provider.  There are currently no reliable tests available to screen for ovarian cancer.  Lung Cancer Lung cancer screening is recommended for adults 67-42 years old who are at  high risk for lung cancer because of a history of smoking. A yearly low-dose CT scan of the lungs is recommended if you:  Currently smoke.  Have a history of at least 30 pack-years of smoking and you currently smoke or have quit within the past 15 years. A pack-year is smoking an average of one pack of cigarettes per day for one year.  Yearly screening should:  Continue until it has been 15 years since you quit.  Stop if you develop a health problem that would prevent you from having lung cancer treatment.  Colorectal Cancer  This type of cancer can be detected and can often be prevented.  Routine colorectal cancer screening usually begins at age 69 and continues through age 45.  If you have risk factors for colon cancer, your health care provider may recommend that you be screened at an earlier age.  If you have a family history of colorectal cancer, talk with your health care provider about genetic screening.  Your health care provider may also recommend using home test kits to check for hidden blood in your stool.  A small camera at the end of a tube can be used to examine your colon directly (sigmoidoscopy or colonoscopy). This is done to check for the earliest forms of colorectal cancer.  Direct examination of the colon should be repeated every 5-10 years until age 87. However, if early forms of precancerous polyps or small growths are found or if you have a family history or genetic risk for colorectal cancer, you may need to be screened more often.  Skin Cancer  Check your skin from head to toe regularly.  Monitor any moles. Be sure to tell your health care provider: ? About any new moles or changes in moles, especially if there is a change in a mole's shape or color. ? If you have a mole that is larger than the size of a pencil eraser.  If any of your family members has a history of skin cancer, especially at a young age, talk with your health care provider about genetic  screening.  Always use sunscreen. Apply sunscreen liberally and repeatedly throughout the day.  Whenever you are outside, protect yourself by wearing long sleeves, pants, a wide-brimmed hat, and sunglasses.  What should I know about osteoporosis? Osteoporosis is a condition in which bone destruction happens more quickly than new bone creation. After menopause, you may be at an increased risk for osteoporosis. To help prevent osteoporosis or the bone fractures that can happen because of osteoporosis, the following is recommended:  If you are 71-74 years old, get at least 1,000 mg of calcium and at least 600 mg of vitamin D per day.  If you are older than age 5 but younger than age 62, get at least 1,200 mg of calcium and  at least 600 mg of vitamin D per day.  If you are older than age 27, get at least 1,200 mg of calcium and at least 800 mg of vitamin D per day.  Smoking and excessive alcohol intake increase the risk of osteoporosis. Eat foods that are rich in calcium and vitamin D, and do weight-bearing exercises several times each week as directed by your health care provider. What should I know about how menopause affects my mental health? Depression may occur at any age, but it is more common as you become older. Common symptoms of depression include:  Low or sad mood.  Changes in sleep patterns.  Changes in appetite or eating patterns.  Feeling an overall lack of motivation or enjoyment of activities that you previously enjoyed.  Frequent crying spells.  Talk with your health care provider if you think that you are experiencing depression. What should I know about immunizations? It is important that you get and maintain your immunizations. These include:  Tetanus, diphtheria, and pertussis (Tdap) booster vaccine.  Influenza every year before the flu season begins.  Pneumonia vaccine.  Shingles vaccine.  Your health care provider may also recommend other  immunizations. This information is not intended to replace advice given to you by your health care provider. Make sure you discuss any questions you have with your health care provider. Document Released: 07/04/2005 Document Revised: 11/30/2015 Document Reviewed: 02/13/2015 Elsevier Interactive Patient Education  2018 [employer].

## 2017-11-11 NOTE — Assessment & Plan Note (Signed)
Stable on current rxs. No changes made today.

## 2017-11-11 NOTE — Assessment & Plan Note (Signed)
Continue current dose of lipitor. Labs today.

## 2017-11-12 ENCOUNTER — Telehealth: Payer: Self-pay | Admitting: Family Medicine

## 2017-11-12 NOTE — Telephone Encounter (Signed)
Copied from [place] [id number]. Topic: Quick Communication - See Telephone Encounter >> Nov 12, 2017  1:44 PM Robina Ade, Helene Kelp D wrote: CRM for notification. See Telephone encounter for: 11/12/17. Patient called and would like a order to be sent to [facility] for balance therapy. Their fax number is [phone]. If any questions please call patient back, thanks.

## 2017-11-16 ENCOUNTER — Other Ambulatory Visit: Payer: Self-pay | Admitting: Family Medicine

## 2017-11-16 DIAGNOSIS — L820 Inflamed seborrheic keratosis: Secondary | ICD-10-CM | POA: Diagnosis not present

## 2017-11-16 DIAGNOSIS — Z961 Presence of intraocular lens: Secondary | ICD-10-CM | POA: Diagnosis not present

## 2017-11-16 DIAGNOSIS — L720 Epidermal cyst: Secondary | ICD-10-CM | POA: Diagnosis not present

## 2017-11-16 DIAGNOSIS — L578 Other skin changes due to chronic exposure to nonionizing radiation: Secondary | ICD-10-CM | POA: Diagnosis not present

## 2017-11-16 DIAGNOSIS — L249 Irritant contact dermatitis, unspecified cause: Secondary | ICD-10-CM | POA: Diagnosis not present

## 2017-11-18 NOTE — Telephone Encounter (Signed)
TA-Patient would like to have an order for balance therapy sent to [facility]? How exactly would I word that to prepare it for you to sign? Plz advise/thx dmf

## 2017-11-18 NOTE — Telephone Encounter (Signed)
I supposed it would just be a PT referral and the reason for referral or dx would be imbalance?

## 2017-11-19 ENCOUNTER — Telehealth: Payer: Self-pay

## 2017-11-19 MED ORDER — MOMETASONE FUROATE 0.1 % EX CREA: 1.0000 "application " | TOPICAL_CREAM | Freq: Every day | CUTANEOUS | 0 refills | Status: DC

## 2017-11-19 NOTE — Telephone Encounter (Signed)
Pt aware/thx dmf 

## 2017-11-19 NOTE — Telephone Encounter (Signed)
TA-Plz see pt statement below/she is advising the name of the topical itching medication that she was told to call back to inform you of/plz advise/thx dmf   Copied from [place] [id number]. Topic: Quick Communication - See Telephone Encounter >> Nov 11, 2017  4:52 PM Antonieta Iba C wrote: CRM for notification. See Telephone encounter for: 11/11/17.   Pt says that she was advised by provider to call back and provide the name of the medication that she used for itching. Pt says the medication name is Mometasone Furate Topical

## 2017-11-19 NOTE — Telephone Encounter (Signed)
Noted.  eRx sent.  Thank you.

## 2017-11-20 NOTE — Telephone Encounter (Signed)
Sent order per TA/thx dmf

## 2017-11-23 DIAGNOSIS — H26493 Other secondary cataract, bilateral: Secondary | ICD-10-CM | POA: Diagnosis not present

## 2017-11-24 ENCOUNTER — Ambulatory Visit (INDEPENDENT_AMBULATORY_CARE_PROVIDER_SITE_OTHER): Payer: Medicare Other | Admitting: Family Medicine

## 2017-11-24 ENCOUNTER — Other Ambulatory Visit: Payer: Self-pay

## 2017-11-24 ENCOUNTER — Encounter: Payer: Self-pay | Admitting: Family Medicine

## 2017-11-24 VITALS — BP 106/54 | HR 62 | Temp 98.3°F | Ht 69.0 in | Wt 153.0 lb

## 2017-11-24 DIAGNOSIS — K5909 Other constipation: Secondary | ICD-10-CM

## 2017-11-24 NOTE — Assessment & Plan Note (Signed)
>  25 minutes spent in face to face time with patient, >50% spent in counselling or coordination of care Discussed importance of not missing dose of Linzess.  Okay to restart her constipation medications and if no results, to try a dose of magnesium citrate. Call or return to clinic prn if these symptoms worsen or fail to improve as anticipated. The patient indicates understanding of these issues and agrees with the plan.

## 2017-11-24 NOTE — Progress Notes (Signed)
Subjective:   Patient ID: Jamie Matthews, female    DOB: 1941-02-01, 77 y.o.   MRN: [id number]  Jamie Matthews is a pleasant 77 y.o. year old female who presents to clinic today with Constipation (Patient is here today to discuss constipation issues and medications for it.  She feels nauseated feeling like she is going to vomit due to the constipation.  She is burping a lot and feels dizzy. She held the [place] for a couple days while her sister was visiting due to it causing a cramping feeling but she started that back yesterday.  This bout started on Friday.  She takes Linzess 197mcg, Amitiza 23mcg, and Miralax.  Her last BM was Thursday 6.27.19.)  on 11/24/2017  HPI:  Patient is here today to discuss constipation issues and medications for it.   She feels nauseated feeling like she is going to vomit due to the constipation. She is burping a lot and feels dizzy. She held the [place] for over a week while her sister was visiting due to concerns she wouldn't be near a bathroom when she had the urge to have a BM.  She just restarted this yesterday.  She normally takes Linzess 173mcg, Amitiza 93mcg, and Miralax. Her last BM was Thursday 6.27.19.   Current Outpatient Medications on File Prior to Visit  Medication Sig Dispense Refill  . atorvastatin (LIPITOR) 20 MG tablet TAKE 1 TABLET DAILY 90 tablet 3  . buPROPion (WELLBUTRIN XL) 150 MG 24 hr tablet TAKE ONE TABLET EVERY DAY 90 tablet 0  . Calcium Carbonate Antacid (ANTACID PO) Take by mouth as needed.    . desonide (DESOWEN) 0.05 % lotion     . dexlansoprazole (DEXILANT) 60 MG capsule Take 1 capsule (60 mg total) by mouth daily. NEEDS OFFICE VISIT FOR FURTHER REFILLS 30 capsule 0  . diphenhydrAMINE (BENADRYL) 25 MG tablet Take 25 mg by mouth every 8 (eight) hours as needed.     . fesoterodine (TOVIAZ) 4 MG TB24 tablet Take 1 tablet (4 mg total) by mouth daily. 30 tablet 3  . ketotifen (ZADITOR) 0.025 % ophthalmic  solution 1 drop 2 (two) times daily.    [name] linaclotide (LINZESS) 145 MCG CAPS capsule Take 1 capsule (145 mcg total) by mouth daily before breakfast. 30 capsule 2  . lubiprostone (AMITIZA) 24 MCG capsule TAKE ONE CAPSULE TWICE A DAY WITH MEALS 60 capsule 3  . MAGNESIUM PO Take by mouth.    . memantine (NAMENDA) 10 MG tablet TAKE 1 TABLET BY MOUTH TWO TIMES DAILY 180 tablet 2  . mirabegron ER (MYRBETRIQ) 50 MG TB24 tablet Take 1 tablet (50 mg total) by mouth daily. 90 tablet 2  . mometasone (ELOCON) 0.1 % cream Apply 1 application topically daily. 45 g 0  . Multiple Vitamins-Minerals (CENTRUM PO) Take one by mouth daily    . Omega-3 Fatty Acids (FISH OIL) 600 MG CAPS Take by mouth.    . polyethylene glycol (MIRALAX / GLYCOLAX) packet Take 17 g by mouth daily.    . ranitidine (ZANTAC) 150 MG tablet Take 1 tablet (150 mg total) by mouth 2 (two) times daily. 60 tablet 5  . solifenacin (VESICARE) 5 MG tablet Take 1 tablet (5 mg total) by mouth daily. 30 tablet 3  . venlafaxine XR (EFFEXOR-XR) 150 MG 24 hr capsule Take 1 capsule (150 mg total) by mouth daily. 90 capsule 1   No current facility-administered medications on file prior to visit.     Allergies  Allergen Reactions  . Sulfa Antibiotics Rash    Past Medical History:  Diagnosis Date  . Anxiety   . Arthritis   . Belching   . Bladder disorder    OVERACTIVE  . Bowel dysfunction    BLOCKAGE  . Constipation   . Depression   . Diverticulitis   . Fibromyalgia   . GERD (gastroesophageal reflux disease)   . Hyperlipidemia   . IBS (irritable bowel syndrome)   . Internal hemorrhoids   . Memory deficits   . Pneumonia 11/18/12  . Urinary incontinence   . Vertigo     Past Surgical History:  Procedure Laterality Date  . BLADDER SUSPENSION  2004, 2012  . CATARACT EXTRACTION W/PHACO Right 08/27/2015   Procedure: CATARACT EXTRACTION PHACO AND INTRAOCULAR LENS PLACEMENT (IOC);  Surgeon: Estill Cotta, MD;  Location: [facility] ORS;   Service: Ophthalmology;  Laterality: Right;  [place]   1:00.2 AP%  22.5 CDE  23.67 fluid casette lot #[id number]  exp05/31/2018  . CATARACT EXTRACTION W/PHACO Left 10/15/2015   Procedure: CATARACT EXTRACTION PHACO AND INTRAOCULAR LENS PLACEMENT (IOC);  Surgeon: Estill Cotta, MD;  Location: [facility] ORS;  Service: Ophthalmology;  Laterality: Left;  [place] 01:07 AP% 18.1 CDE 21.57 fluid pack lot # [id number]  . CHOLECYSTECTOMY    . TONSILLECTOMY  1947    Family History  Problem Relation Age of Onset  . Cancer Father   . Diabetes Father   . Heart disease Father   . Heart disease Mother   . Breast cancer Sister 28  . Colon cancer Neg Hx   . Esophageal cancer Neg Hx   . Rectal cancer Neg Hx   . Stomach cancer Neg Hx   . Bladder Cancer Neg Hx   . Kidney cancer Neg Hx     Social History   Socioeconomic History  . Marital status: Married    Spouse name: Not on file  . Number of children: Not on file  . Years of education: Not on file  . Highest education level: Not on file  Occupational History  . Occupation: Retired  Scientific laboratory technician  . Financial resource strain: Not on file  . Food insecurity:    Worry: Not on file    Inability: Not on file  . Transportation needs:    Medical: Not on file    Non-medical: Not on file  Tobacco Use  . Smoking status: Never Smoker  . Smokeless tobacco: Never Used  Substance and Sexual Activity  . Alcohol use: Yes    Comment: OCCAS  . Drug use: No  . Sexual activity: Not Currently  Lifestyle  . Physical activity:    Days per week: Not on file    Minutes per session: Not on file  . Stress: Not on file  Relationships  . Social connections:    Talks on phone: Not on file    Gets together: Not on file    Attends religious service: Not on file    Active member of club or organization: Not on file    Attends meetings of clubs or organizations: Not on file    Relationship status: Not on file  . Intimate partner violence:    Fear of current or ex  partner: Not on file    Emotionally abused: Not on file    Physically abused: Not on file    Forced sexual activity: Not on file  Other Topics Concern  . Not on file  Social History Narrative   Recently moved  with her husband to [place] from [place].   Husband is a retired Pharmacist, community.   No children.   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   The PMH, PSH, Social History, Family History, Medications, and allergies have been reviewed in [facility], and have been updated if relevant.   Review of Systems  Constitutional: Negative.   Gastrointestinal: Positive for abdominal distention, abdominal pain, constipation and nausea. Negative for anal bleeding, blood in stool, diarrhea, rectal pain and vomiting.  All other systems reviewed and are negative.      Objective:    BP (!) 106/54 (BP Location: Left Arm, Patient Position: Sitting, Cuff Size: Normal)   Pulse 62   Temp 98.3 F (36.8 C) (Oral)   Ht 5\' 9"  (1.753 m)   Wt 153 lb (69.4 kg)   SpO2 93%   BMI 22.59 kg/m    Physical Exam  Constitutional: She is oriented to person, place, and time. She appears well-developed and well-nourished. No distress.  HENT:  Head: Normocephalic and atraumatic.  Eyes: EOM are normal.  Cardiovascular: Normal rate.  Pulmonary/Chest: Effort normal.  Abdominal: Soft. Bowel sounds are normal. She exhibits no distension. There is no tenderness. There is no rebound and no guarding.  Musculoskeletal: Normal range of motion.  Neurological: She is alert and oriented to person, place, and time. No cranial nerve deficit.  Skin: Skin is warm and dry. She is not diaphoretic.  Psychiatric: She has a normal mood and affect. Her behavior is normal. Judgment and thought content normal.  Nursing note and vitals reviewed.         Assessment & Plan:   Chronic constipation No follow-ups on file.

## 2017-11-24 NOTE — Patient Instructions (Signed)
Great to see you. Please ask your insurance company for a list of their preferred drugs.  Make sure you take linzess daily please.

## 2017-11-28 ENCOUNTER — Other Ambulatory Visit: Payer: Self-pay | Admitting: Internal Medicine

## 2017-11-30 ENCOUNTER — Telehealth: Payer: Self-pay | Admitting: Internal Medicine

## 2017-11-30 MED ORDER — DEXLANSOPRAZOLE 60 MG PO CPDR: 1.0000 | DELAYED_RELEASE_CAPSULE | Freq: Every day | ORAL | 1 refills | Status: DC

## 2017-11-30 NOTE — Telephone Encounter (Signed)
Appointment scheduled. Patient is requesting refill to be sent to [facility] in [place].

## 2017-11-30 NOTE — Telephone Encounter (Signed)
Patient says that the pharmacy is telling her that this medication is now requiring a prior [place]

## 2017-11-30 NOTE — Telephone Encounter (Signed)
Rx sent 

## 2017-11-30 NOTE — Telephone Encounter (Signed)
Prior auth initiated on [email or web address]

## 2017-12-02 NOTE — Telephone Encounter (Signed)
Dexilant does not need approval for twice daily dosing per [facility]. However, per pharmacy , [facility] do need approval through insurance. Kennyth Lose (pharmacist) states that even though these have both been approved previously, every month, insurance kicks back these prescriptions saying they were not aware patient was on both medications at the same time. I will contact insurance about this.

## 2017-12-02 NOTE — Telephone Encounter (Signed)
Amitiza was already approved from 08/28/17-08/29/18 through [facility] ([id number] approval code). I also sent information through [email or web address] for Linzess. 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 She has tried Amitiza, Miralax, dietary changes and other over the counter supplements with failure to these. However, with the addition of linzess, she has had improvement in symptoms. We will await response of insurance regarding this. I have advised Kennyth Lose at pharmacy of this as well.

## 2017-12-04 ENCOUNTER — Telehealth: Payer: Self-pay

## 2017-12-04 ENCOUNTER — Other Ambulatory Visit: Payer: Self-pay | Admitting: Internal Medicine

## 2017-12-04 NOTE — Telephone Encounter (Signed)
Linzess 145 mcg capsule has been approved from [facility] through 12/04/18

## 2017-12-04 NOTE — Telephone Encounter (Signed)
TA-I LMOVM for pt asking if she is feeling less fatigued or any better/also advised that a B-Complex in the morning may help her to feel a bit better and less tired and that I would get this message to you as well/Plz advise if you have any ideas or anything that you want me to tell her/thx dmf   Copied from [place] [id number]. Topic: General - Other >> Nov 30, 2017 11:57 AM Marin Olp L wrote: Reason for CRM: Patient experiencing fatigue and abnormal length of sleep hours. She would like a call back from Dr. Deborra Medina or cma.

## 2017-12-08 ENCOUNTER — Ambulatory Visit (INDEPENDENT_AMBULATORY_CARE_PROVIDER_SITE_OTHER)
Admission: RE | Admit: 2017-12-08 | Discharge: 2017-12-08 | Disposition: A | Discharge status: Home / Self Care | Payer: Medicare Other | Source: Ambulatory Visit | Attending: Family Medicine | Admitting: Family Medicine

## 2017-12-08 DIAGNOSIS — Z780 Asymptomatic menopausal state: Secondary | ICD-10-CM | POA: Diagnosis not present

## 2017-12-09 DIAGNOSIS — R278 Other lack of coordination: Secondary | ICD-10-CM | POA: Diagnosis not present

## 2017-12-09 DIAGNOSIS — R2689 Other abnormalities of gait and mobility: Secondary | ICD-10-CM | POA: Diagnosis not present

## 2017-12-09 DIAGNOSIS — R2681 Unsteadiness on feet: Secondary | ICD-10-CM | POA: Diagnosis not present

## 2017-12-10 ENCOUNTER — Telehealth: Payer: Self-pay

## 2017-12-10 NOTE — Telephone Encounter (Signed)
-----   Message from Lucille Passy, MD sent at 12/10/2017 10:11 AM EDT ----- Please let pt know that she has osteopenia(thinner bones)- NOT osteoporosis- continue good calcium and vitamin Roosevelt Bisher intake in diet and weight bearing exercise.  Follow up bone density in 2 years.

## 2017-12-10 NOTE — Telephone Encounter (Signed)
PEC- I LMOVM for pt to RTC as we have results of bone density scan/please give results below:  Please let pt know that she has osteopenia(thinner bones)- NOT osteoporosis- continue good calcium and vitamin Zendaya Groseclose intake in diet and weight bearing exercise. Follow up bone density in 2 years. Corrin Parker dmf

## 2017-12-11 DIAGNOSIS — R2681 Unsteadiness on feet: Secondary | ICD-10-CM | POA: Diagnosis not present

## 2017-12-11 DIAGNOSIS — R2689 Other abnormalities of gait and mobility: Secondary | ICD-10-CM | POA: Diagnosis not present

## 2017-12-11 DIAGNOSIS — R278 Other lack of coordination: Secondary | ICD-10-CM | POA: Diagnosis not present

## 2017-12-14 DIAGNOSIS — R2689 Other abnormalities of gait and mobility: Secondary | ICD-10-CM | POA: Diagnosis not present

## 2017-12-14 DIAGNOSIS — R2681 Unsteadiness on feet: Secondary | ICD-10-CM | POA: Diagnosis not present

## 2017-12-14 DIAGNOSIS — R278 Other lack of coordination: Secondary | ICD-10-CM | POA: Diagnosis not present

## 2017-12-15 NOTE — Telephone Encounter (Signed)
Pt calling back to get bone density results.

## 2017-12-16 ENCOUNTER — Telehealth: Payer: Self-pay | Admitting: *Deleted

## 2017-12-16 NOTE — Telephone Encounter (Signed)
Result note read to patient; verbalizes understanding.  Telephone encounter as result note was not routed to [facility]. 

## 2017-12-17 DIAGNOSIS — R278 Other lack of coordination: Secondary | ICD-10-CM | POA: Diagnosis not present

## 2017-12-17 DIAGNOSIS — R2689 Other abnormalities of gait and mobility: Secondary | ICD-10-CM | POA: Diagnosis not present

## 2017-12-17 DIAGNOSIS — R2681 Unsteadiness on feet: Secondary | ICD-10-CM | POA: Diagnosis not present

## 2017-12-21 DIAGNOSIS — R278 Other lack of coordination: Secondary | ICD-10-CM | POA: Diagnosis not present

## 2017-12-21 DIAGNOSIS — R2689 Other abnormalities of gait and mobility: Secondary | ICD-10-CM | POA: Diagnosis not present

## 2017-12-21 DIAGNOSIS — R2681 Unsteadiness on feet: Secondary | ICD-10-CM | POA: Diagnosis not present

## 2017-12-23 DIAGNOSIS — R278 Other lack of coordination: Secondary | ICD-10-CM | POA: Diagnosis not present

## 2017-12-23 DIAGNOSIS — R2681 Unsteadiness on feet: Secondary | ICD-10-CM | POA: Diagnosis not present

## 2017-12-23 DIAGNOSIS — R2689 Other abnormalities of gait and mobility: Secondary | ICD-10-CM | POA: Diagnosis not present

## 2017-12-24 ENCOUNTER — Other Ambulatory Visit: Payer: Self-pay | Admitting: Family Medicine

## 2017-12-25 DIAGNOSIS — R2689 Other abnormalities of gait and mobility: Secondary | ICD-10-CM | POA: Diagnosis not present

## 2017-12-25 DIAGNOSIS — R278 Other lack of coordination: Secondary | ICD-10-CM | POA: Diagnosis not present

## 2017-12-25 DIAGNOSIS — R2681 Unsteadiness on feet: Secondary | ICD-10-CM | POA: Diagnosis not present

## 2017-12-29 DIAGNOSIS — R2681 Unsteadiness on feet: Secondary | ICD-10-CM | POA: Diagnosis not present

## 2017-12-29 DIAGNOSIS — R278 Other lack of coordination: Secondary | ICD-10-CM | POA: Diagnosis not present

## 2017-12-29 DIAGNOSIS — R2689 Other abnormalities of gait and mobility: Secondary | ICD-10-CM | POA: Diagnosis not present

## 2017-12-31 DIAGNOSIS — R278 Other lack of coordination: Secondary | ICD-10-CM | POA: Diagnosis not present

## 2017-12-31 DIAGNOSIS — R2689 Other abnormalities of gait and mobility: Secondary | ICD-10-CM | POA: Diagnosis not present

## 2017-12-31 DIAGNOSIS — R2681 Unsteadiness on feet: Secondary | ICD-10-CM | POA: Diagnosis not present

## 2018-01-01 DIAGNOSIS — R2681 Unsteadiness on feet: Secondary | ICD-10-CM | POA: Diagnosis not present

## 2018-01-01 DIAGNOSIS — R2689 Other abnormalities of gait and mobility: Secondary | ICD-10-CM | POA: Diagnosis not present

## 2018-01-01 DIAGNOSIS — R278 Other lack of coordination: Secondary | ICD-10-CM | POA: Diagnosis not present

## 2018-01-05 DIAGNOSIS — R2681 Unsteadiness on feet: Secondary | ICD-10-CM | POA: Diagnosis not present

## 2018-01-05 DIAGNOSIS — R2689 Other abnormalities of gait and mobility: Secondary | ICD-10-CM | POA: Diagnosis not present

## 2018-01-05 DIAGNOSIS — R278 Other lack of coordination: Secondary | ICD-10-CM | POA: Diagnosis not present

## 2018-01-07 DIAGNOSIS — R278 Other lack of coordination: Secondary | ICD-10-CM | POA: Diagnosis not present

## 2018-01-07 DIAGNOSIS — R2689 Other abnormalities of gait and mobility: Secondary | ICD-10-CM | POA: Diagnosis not present

## 2018-01-07 DIAGNOSIS — R2681 Unsteadiness on feet: Secondary | ICD-10-CM | POA: Diagnosis not present

## 2018-01-12 DIAGNOSIS — R2689 Other abnormalities of gait and mobility: Secondary | ICD-10-CM | POA: Diagnosis not present

## 2018-01-12 DIAGNOSIS — R278 Other lack of coordination: Secondary | ICD-10-CM | POA: Diagnosis not present

## 2018-01-12 DIAGNOSIS — R2681 Unsteadiness on feet: Secondary | ICD-10-CM | POA: Diagnosis not present

## 2018-01-15 DIAGNOSIS — R2681 Unsteadiness on feet: Secondary | ICD-10-CM | POA: Diagnosis not present

## 2018-01-15 DIAGNOSIS — R278 Other lack of coordination: Secondary | ICD-10-CM | POA: Diagnosis not present

## 2018-01-15 DIAGNOSIS — R2689 Other abnormalities of gait and mobility: Secondary | ICD-10-CM | POA: Diagnosis not present

## 2018-01-18 DIAGNOSIS — R2681 Unsteadiness on feet: Secondary | ICD-10-CM | POA: Diagnosis not present

## 2018-01-18 DIAGNOSIS — R2689 Other abnormalities of gait and mobility: Secondary | ICD-10-CM | POA: Diagnosis not present

## 2018-01-18 DIAGNOSIS — L578 Other skin changes due to chronic exposure to nonionizing radiation: Secondary | ICD-10-CM | POA: Diagnosis not present

## 2018-01-18 DIAGNOSIS — L219 Seborrheic dermatitis, unspecified: Secondary | ICD-10-CM | POA: Diagnosis not present

## 2018-01-18 DIAGNOSIS — L820 Inflamed seborrheic keratosis: Secondary | ICD-10-CM | POA: Diagnosis not present

## 2018-01-18 DIAGNOSIS — L720 Epidermal cyst: Secondary | ICD-10-CM | POA: Diagnosis not present

## 2018-01-18 DIAGNOSIS — R278 Other lack of coordination: Secondary | ICD-10-CM | POA: Diagnosis not present

## 2018-01-18 DIAGNOSIS — L821 Other seborrheic keratosis: Secondary | ICD-10-CM | POA: Diagnosis not present

## 2018-01-20 DIAGNOSIS — R2681 Unsteadiness on feet: Secondary | ICD-10-CM | POA: Diagnosis not present

## 2018-01-20 DIAGNOSIS — R2689 Other abnormalities of gait and mobility: Secondary | ICD-10-CM | POA: Diagnosis not present

## 2018-01-20 DIAGNOSIS — R278 Other lack of coordination: Secondary | ICD-10-CM | POA: Diagnosis not present

## 2018-01-22 ENCOUNTER — Other Ambulatory Visit: Payer: Self-pay | Admitting: Internal Medicine

## 2018-01-22 DIAGNOSIS — R2689 Other abnormalities of gait and mobility: Secondary | ICD-10-CM | POA: Diagnosis not present

## 2018-01-22 DIAGNOSIS — R278 Other lack of coordination: Secondary | ICD-10-CM | POA: Diagnosis not present

## 2018-01-22 DIAGNOSIS — R2681 Unsteadiness on feet: Secondary | ICD-10-CM | POA: Diagnosis not present

## 2018-01-25 DIAGNOSIS — R2689 Other abnormalities of gait and mobility: Secondary | ICD-10-CM | POA: Diagnosis not present

## 2018-01-25 DIAGNOSIS — R2681 Unsteadiness on feet: Secondary | ICD-10-CM | POA: Diagnosis not present

## 2018-01-27 DIAGNOSIS — R2681 Unsteadiness on feet: Secondary | ICD-10-CM | POA: Diagnosis not present

## 2018-01-27 DIAGNOSIS — R2689 Other abnormalities of gait and mobility: Secondary | ICD-10-CM | POA: Diagnosis not present

## 2018-02-03 DIAGNOSIS — L300 Nummular dermatitis: Secondary | ICD-10-CM | POA: Diagnosis not present

## 2018-02-03 DIAGNOSIS — L720 Epidermal cyst: Secondary | ICD-10-CM | POA: Diagnosis not present

## 2018-02-03 DIAGNOSIS — L299 Pruritus, unspecified: Secondary | ICD-10-CM | POA: Diagnosis not present

## 2018-02-09 ENCOUNTER — Ambulatory Visit (INDEPENDENT_AMBULATORY_CARE_PROVIDER_SITE_OTHER): Payer: Medicare Other | Admitting: Internal Medicine

## 2018-02-09 ENCOUNTER — Encounter (INDEPENDENT_AMBULATORY_CARE_PROVIDER_SITE_OTHER): Payer: Self-pay

## 2018-02-09 ENCOUNTER — Encounter: Payer: Self-pay | Admitting: Internal Medicine

## 2018-02-09 VITALS — BP 110/70 | HR 66 | Ht 62.5 in | Wt 154.4 lb

## 2018-02-09 DIAGNOSIS — K219 Gastro-esophageal reflux disease without esophagitis: Secondary | ICD-10-CM | POA: Diagnosis not present

## 2018-02-09 DIAGNOSIS — R140 Abdominal distension (gaseous): Secondary | ICD-10-CM | POA: Diagnosis not present

## 2018-02-09 DIAGNOSIS — K581 Irritable bowel syndrome with constipation: Secondary | ICD-10-CM | POA: Diagnosis not present

## 2018-02-09 MED ORDER — LUBIPROSTONE 24 MCG PO CAPS: ORAL_CAPSULE | ORAL | 3 refills | Status: DC

## 2018-02-09 NOTE — Progress Notes (Signed)
   Subjective:    Patient ID: Jamie Matthews, female    DOB: [birth year], 77 y.o.   MRN: [id number]  HPI Jamie Matthews is a 77 year old female with a history of GERD, colon polyps, chronic constipation with IBS who is here for follow-up.  She is here today with her husband and was last seen on 06/29/2017.  Most recently she has been dealing with frequent lower abdominal gas with some mild bloating symptom.  She is also noted her stools to be more loose of late occurring 3-4 times per day.  She finds the gas troublesome.  She is not having significant abdominal pain.  She denies blood in her stool and melena.  She is using Amitiza 24 mcg twice daily and Linzess 145 mg in the morning.  She will occasionally use MiraLAX but has not needed to do so recently.  Her reflux disease has been well controlled with Dexilant 60 mg in the morning and Zantac in the evening.  No dysphagia or odynophagia.  Last colonoscopy reviewed together performed on 02/14/2016 for positive Cologuard.  This revealed 2 polyps 5 and 12 mm found to be sessile serrated polyps without dysplasia.  Review of Systems As per HPI, otherwise negative  Current Medications, Allergies, Past Medical History, Past Surgical History, Family History and Social History were reviewed in [employer] record.      Objective:   Physical Exam BP 110/70   Pulse 66   Ht 5' 2.5" (1.588 m)   Wt 154 lb 6 oz (70 kg)   BMI 27.79 kg/m  Gen: awake, alert, NAD HEENT: anicteric, op clear CV: RRR, no mrg Pulm: CTA b/l Abd: soft, NT/ND, +BS throughout Ext: no c/c/e Neuro: nonfocal        Assessment & Plan:  77 year old female with a history of GERD, colon polyps, chronic constipation with IBS who is here for follow-up.   1.  IBS with chronic constipation/abd bloating -- most recently she is having issues with bloating and excessive stooling (3-4 times per day and loose).  Bloating and loose stools most likely  relate to her laxative use.  I will have her continue Amitiza 24 mcg BID and stop the Linzess for now.  Linzess can be associated with bloating symptom.  She can continue MiraLax 17 g daily on an as needed basis.  If no improvement or return of constipation, she is asked to notify me  2. GERD -- continue with Dexilant 60 mg once daily and ranitidine 150 mg in the evening.  Good control of symptoms currently.  3. Hx of colon polyps -- repeat colon due in 1 year, Sept 2020.  Will discuss benefit/risk of this procedure closer to that time.  6 month follow-up, sooner as needed  25 minutes spent with the patient today. Greater than 50% was spent in counseling and coordination of care with the patient

## 2018-02-09 NOTE — Patient Instructions (Addendum)
Discontinue Linzess.  Continue Amitiza 24 mcg twice daily.  Continue Dexilant in the morning and Zantac every evening.  Use Miralax 17 grams as needed.  Please follow up with Dr Hilarie Fredrickson in 6 months.  If you are age 79 or older, your body mass index should be between 23-30. Your Body mass index is 27.79 kg/m. If this is out of the aforementioned range listed, please consider follow up with your Primary Care Provider.  If you are age 58 or younger, your body mass index should be between 19-25. Your Body mass index is 27.79 kg/m. If this is out of the aformentioned range listed, please consider follow up with your Primary Care Provider.

## 2018-02-18 ENCOUNTER — Telehealth: Payer: Self-pay | Admitting: Family Medicine

## 2018-02-18 NOTE — Telephone Encounter (Signed)
Copied from [place] [id number]. Topic: Quick Communication - See Telephone Encounter >> Feb 18, 2018 12:12 PM Vernona Rieger wrote: CRM for notification. See Telephone encounter for: 02/18/18.  Patient would like Sharyn Lull to call her back with her bone density results. Please call patient @ [phone]

## 2018-02-19 NOTE — Telephone Encounter (Signed)
Spoke with pt and reviewed her Bone density result. She verbalized understanding.

## 2018-02-23 ENCOUNTER — Other Ambulatory Visit: Payer: Self-pay | Admitting: Family Medicine

## 2018-02-23 ENCOUNTER — Other Ambulatory Visit: Payer: Self-pay | Admitting: Internal Medicine

## 2018-03-15 ENCOUNTER — Telehealth: Payer: Self-pay | Admitting: Internal Medicine

## 2018-03-15 NOTE — Telephone Encounter (Signed)
Pt states she currently takes zantac 150mg  bid. Discussed with her that Dr. Hilarie Fredrickson recommends that she take Pepcid 20mg  BID. Pt verbalized understanding.

## 2018-03-15 NOTE — Telephone Encounter (Signed)
Patient states both her and her husband are prescribed medication zantac by Dr.Pyrtle and they would like to know what they should do since they heard about the dangers of it.

## 2018-03-22 DIAGNOSIS — L821 Other seborrheic keratosis: Secondary | ICD-10-CM | POA: Diagnosis not present

## 2018-03-22 DIAGNOSIS — L820 Inflamed seborrheic keratosis: Secondary | ICD-10-CM | POA: Diagnosis not present

## 2018-03-22 DIAGNOSIS — L720 Epidermal cyst: Secondary | ICD-10-CM | POA: Diagnosis not present

## 2018-03-23 ENCOUNTER — Ambulatory Visit (INDEPENDENT_AMBULATORY_CARE_PROVIDER_SITE_OTHER): Payer: Medicare Other | Admitting: Behavioral Health

## 2018-03-23 DIAGNOSIS — Z23 Encounter for immunization: Secondary | ICD-10-CM

## 2018-03-23 NOTE — Progress Notes (Signed)
Pt came in for influenza vaccination. Pt received influenza vaccination in left arm which was documented in chart. Pt tolerated vaccination well. No signs or symptoms of a reaction.

## 2018-03-25 ENCOUNTER — Other Ambulatory Visit: Payer: Self-pay | Admitting: Family Medicine

## 2018-03-25 ENCOUNTER — Other Ambulatory Visit: Payer: Self-pay | Admitting: Internal Medicine

## 2018-04-12 ENCOUNTER — Other Ambulatory Visit: Payer: Self-pay | Admitting: Family Medicine

## 2018-04-12 ENCOUNTER — Telehealth: Payer: Self-pay

## 2018-04-12 DIAGNOSIS — N6489 Other specified disorders of breast: Secondary | ICD-10-CM

## 2018-04-12 NOTE — Telephone Encounter (Signed)
Copied from [place] [id number]. Topic: Referral - Request for Referral >> Apr 12, 2018 10:13 AM Parke Poisson wrote: Has patient seen PCP for this complaint? no *If NO, is insurance requiring patient see PCP for this issue before PCP can refer them? Referral for which specialty: Mammogram Preferred provider/office: [facility] Reason for referral: Pt would like an order put in to [facility] for a mammogram

## 2018-04-13 NOTE — Telephone Encounter (Signed)
Patient is currently scheduled/thx dmf

## 2018-05-05 ENCOUNTER — Ambulatory Visit: Payer: Self-pay | Admitting: Family Medicine

## 2018-05-05 ENCOUNTER — Ambulatory Visit: Payer: Self-pay | Admitting: *Deleted

## 2018-05-05 ENCOUNTER — Ambulatory Visit
Admission: RE | Admit: 2018-05-05 | Discharge: 2018-05-05 | Disposition: A | Discharge status: Home / Self Care | Payer: Medicare Other | Source: Ambulatory Visit | Attending: Family Medicine | Admitting: Family Medicine

## 2018-05-05 ENCOUNTER — Other Ambulatory Visit: Payer: Self-pay | Admitting: Family Medicine

## 2018-05-05 DIAGNOSIS — N6321 Unspecified lump in the left breast, upper outer quadrant: Secondary | ICD-10-CM | POA: Diagnosis not present

## 2018-05-05 DIAGNOSIS — N6320 Unspecified lump in the left breast, unspecified quadrant: Secondary | ICD-10-CM

## 2018-05-05 DIAGNOSIS — N6489 Other specified disorders of breast: Secondary | ICD-10-CM

## 2018-05-05 DIAGNOSIS — R928 Other abnormal and inconclusive findings on diagnostic imaging of breast: Secondary | ICD-10-CM

## 2018-05-05 DIAGNOSIS — N6322 Unspecified lump in the left breast, upper inner quadrant: Secondary | ICD-10-CM | POA: Diagnosis not present

## 2018-05-05 NOTE — Telephone Encounter (Signed)
Pt c/o constipation for 1 week but pt stated that she has chronic constipation. Her last BM was 1 week ago. Pt denies abdominal pain, nausea or vomiting. Pt stated that she has decreased appetite. Pt stated that she saw a small amount of blood on the surface of her BM once and the stools are very hard. Pt is taking Miralax daily. Yesterday she took magnesium citrate and was not effective. At one point she was on Linzess and Miralax and it caused very loose stools. She stated her GI physician discontinued the [place]. Care advice given to pt and pt stated understanding. Pt given appt tomorrow morning.  Reason for Disposition . Last bowel movement (BM) > 4 days ago  Answer Assessment - Initial Assessment Questions 1. STOOL PATTERN OR FREQUENCY: "How often do you pass bowel movements (BMs)?"  (Normal range: tid to q 3 days)  "When was the last BM passed?"       Every day- Last BM 1 week ago 2. STRAINING: "Do you have to strain to have a BM?"      no 3. RECTAL PAIN: "Does your rectum hurt when the stool comes out?" If so, ask: "Do you have hemorrhoids? How bad is the pain?"  (Scale 1-10; or mild, moderate, severe)     No- no 4. STOOL COMPOSITION: "Are the stools hard?"      yes 5. BLOOD ON STOOLS: "Has there been any blood on the toilet tissue or on the surface of the BM?" If so, ask: "When was the last time?"      Blood on BM when BM is very hard 6. CHRONIC CONSTIPATION: "Is this a new problem for you?"  If no, ask: "How long have you had this problem?" (days, weeks, months)      No- 50 years 7. CHANGES IN DIET: "Have there been any recent changes in your diet?"      No  8. MEDICATIONS: "Have you been taking any new medications?"     no 9. LAXATIVES: "Have you been using any laxatives or enemas?"  If yes, ask "What, how often, and when was the last time?"     Miralax this morning yesterday took Magnesium Citrate 10. CAUSE: "What do you think is causing the constipation?"        no 11. OTHER  SYMPTOMS: "Do you have any other symptoms?" (e.g., abdominal pain, fever, vomiting)       Decreased appetite  12. PREGNANCY: "Is there any chance you are pregnant?" "When was your last menstrual period?"       n/a  Protocols used: CONSTIPATION-A-AH

## 2018-05-05 NOTE — Telephone Encounter (Signed)
Attempted to contact pt regarding symptoms; left message on voicemail [phone].

## 2018-05-05 NOTE — Telephone Encounter (Signed)
Called both numbers for this patient. A man answered on the home number and stated she had gone for a mammogram. Asked him if he could have her call the office backs so we can discuss her message. He voiced understanding.

## 2018-05-06 ENCOUNTER — Ambulatory Visit: Payer: Self-pay | Admitting: Family Medicine

## 2018-05-13 ENCOUNTER — Ambulatory Visit: Payer: Medicare Other | Admitting: Family Medicine

## 2018-05-24 ENCOUNTER — Other Ambulatory Visit: Payer: Self-pay | Admitting: Internal Medicine

## 2018-05-24 ENCOUNTER — Other Ambulatory Visit: Payer: Self-pay | Admitting: Family Medicine

## 2018-05-25 ENCOUNTER — Encounter: Payer: Self-pay | Admitting: Family Medicine

## 2018-05-25 ENCOUNTER — Ambulatory Visit (INDEPENDENT_AMBULATORY_CARE_PROVIDER_SITE_OTHER): Payer: Medicare Other | Admitting: Family Medicine

## 2018-05-25 VITALS — BP 124/84 | HR 68 | Temp 98.2°F | Ht 63.0 in | Wt 144.4 lb

## 2018-05-25 DIAGNOSIS — K581 Irritable bowel syndrome with constipation: Secondary | ICD-10-CM | POA: Diagnosis not present

## 2018-05-25 DIAGNOSIS — K219 Gastro-esophageal reflux disease without esophagitis: Secondary | ICD-10-CM

## 2018-05-25 DIAGNOSIS — K5909 Other constipation: Secondary | ICD-10-CM | POA: Diagnosis not present

## 2018-05-25 DIAGNOSIS — K589 Irritable bowel syndrome without diarrhea: Secondary | ICD-10-CM | POA: Insufficient documentation

## 2018-05-25 NOTE — Patient Instructions (Signed)
Great to see you. Happy New Year.  Please restart Pepcid 20 mg twice daily. Restart miralax.

## 2018-05-25 NOTE — Assessment & Plan Note (Addendum)
>  25 minutes spent in face to face time with patient, >50% spent in counselling or coordination of care Chronic issue.  Has IBS with constipation and GERD.  She often comes in for worsening symptoms when she is off scheudule with her medications.  She will restart pepcid 20 mg twice daily and as needed miralax. Advised to push fluids, eat foods that are gentle but also have some fiber. Call or return to clinic prn if these symptoms worsen or fail to improve as anticipated. The patient indicates understanding of these issues and agrees with the plan.

## 2018-05-25 NOTE — Progress Notes (Signed)
Subjective:   Patient ID: Jamie Matthews, female    DOB: [birth year], 77 y.o.   MRN: [id number]  Keshana Klemz is a pleasant 77 y.o. year old female who presents to clinic today with Constipation (Patient is here today C/O trouble with constipation and reflux.  She currently takes Amitiza 39mcg bid per Dr. Hilarie Fredrickson who D/C'ed the Linzess as it was causing very loose stools.  On 10.21.19 pt was advised to D/C Zantac and start Pepcid OTC 20mg  bid in combination with her Tums and Dexilant 60mg . )  on 05/25/2018  HPI:  Chronic issue. H/o IBS with constipation and GERD.  Followed by Dr. Hilarie Fredrickson who stopped her linzess as it was causing loose stools.  Currently taking Amitiza 24 mcg twice daily.  Dr. Hilarie Fredrickson also advised that she d/c zantac and start pepcid 20 twice daily along with her tums and Dexilant. She will occasionally use MiraLAX but has not needed to do so recently.  She has been having more reflux and nausea for past week.  She does think she got off schedule with her medications around Christmas.  Has not been taking Pepcid twice daily or miralax.  She has lost weight but this has been intentional.   Current Outpatient Medications on File Prior to Visit  Medication Sig Dispense Refill  . atorvastatin (LIPITOR) 20 MG tablet TAKE 1 TABLET DAILY 90 tablet 3  . buPROPion (WELLBUTRIN XL) 150 MG 24 hr tablet TAKE ONE TABLET BY MOUTH EVERY DAY 90 tablet 2  . Calcium Carbonate Antacid (ANTACID PO) Take by mouth as needed.    . desonide (DESOWEN) 0.05 % lotion     . DEXILANT 60 MG capsule TAKE 1 CAPSULE BY MOUTH ONCE DAILY 90 capsule 0  . dextromethorphan-guaiFENesin (MUCINEX DM) 30-600 MG 12hr tablet Take 1 tablet by mouth 2 (two) times daily.    . famotidine (PEPCID) 20 MG tablet Take 20 mg by mouth 2 (two) times daily.    [name] ketotifen (ZADITOR) 0.025 % ophthalmic solution 1 drop 2 (two) times daily.    [name] lubiprostone (AMITIZA) 24 MCG capsule TAKE ONE CAPSULE  TWICE A DAY WITH MEALS 60 capsule 3  . MAGNESIUM PO Take by mouth.    . memantine (NAMENDA) 10 MG tablet TAKE 1 TABLET BY MOUTH TWICE DAILY 180 tablet 2  . mometasone (ELOCON) 0.1 % cream Apply 1 application topically daily. 45 g 0  . Multiple Vitamins-Minerals (CENTRUM PO) Take one by mouth daily    . MYRBETRIQ 50 MG TB24 tablet TAKE 1 TABLET BY MOUTH DAILY 90 tablet 1  . Omega-3 Fatty Acids (FISH OIL) 600 MG CAPS Take by mouth.    . polyethylene glycol (MIRALAX / GLYCOLAX) packet Take 17 g by mouth daily.    . TOVIAZ 4 MG TB24 tablet TAKE 1 TABLET BY MOUTH DAILY 30 each 5  . venlafaxine XR (EFFEXOR-XR) 150 MG 24 hr capsule TAKE 1 CAPSULE BY MOUTH DAILY 90 capsule 1  . diphenhydrAMINE (BENADRYL) 25 MG tablet Take 25 mg by mouth every 8 (eight) hours as needed.     . solifenacin (VESICARE) 5 MG tablet Take 1 tablet (5 mg total) by mouth daily. (Patient not taking: Reported on 05/25/2018) 30 tablet 3   No current facility-administered medications on file prior to visit.     Allergies  Allergen Reactions  . Sulfa Antibiotics Rash    Past Medical History:  Diagnosis Date  . Anxiety   . Arthritis   . Belching   .  Bladder disorder    OVERACTIVE  . Bowel dysfunction    BLOCKAGE  . Constipation   . Depression   . Diverticulitis   . Fibromyalgia   . GERD (gastroesophageal reflux disease)   . Hyperlipidemia   . IBS (irritable bowel syndrome)   . Internal hemorrhoids   . Memory deficits   . Pneumonia 11/18/12  . Urinary incontinence   . Vertigo     Past Surgical History:  Procedure Laterality Date  . BLADDER SUSPENSION  2004, 2012  . CATARACT EXTRACTION W/PHACO Right 08/27/2015   Procedure: CATARACT EXTRACTION PHACO AND INTRAOCULAR LENS PLACEMENT (IOC);  Surgeon: Estill Cotta, MD;  Location: [facility] ORS;  Service: Ophthalmology;  Laterality: Right;  [place]   1:00.2 AP%  22.5 CDE  23.67 fluid casette lot #[id number]  exp05/31/2018  . CATARACT EXTRACTION W/PHACO Left 10/15/2015    Procedure: CATARACT EXTRACTION PHACO AND INTRAOCULAR LENS PLACEMENT (IOC);  Surgeon: Estill Cotta, MD;  Location: [facility] ORS;  Service: Ophthalmology;  Laterality: Left;  [place] 01:07 AP% 18.1 CDE 21.57 fluid pack lot # [id number]  . CHOLECYSTECTOMY    . TONSILLECTOMY  1947    Family History  Problem Relation Age of Onset  . Cancer Father   . Diabetes Father   . Heart disease Father   . Heart disease Mother   . Breast cancer Sister 53  . Colon cancer Neg Hx   . Esophageal cancer Neg Hx   . Rectal cancer Neg Hx   . Stomach cancer Neg Hx   . Bladder Cancer Neg Hx   . Kidney cancer Neg Hx     Social History   Socioeconomic History  . Marital status: Married    Spouse name: Not on file  . Number of children: Not on file  . Years of education: Not on file  . Highest education level: Not on file  Occupational History  . Occupation: Retired  Scientific laboratory technician  . Financial resource strain: Not on file  . Food insecurity:    Worry: Not on file    Inability: Not on file  . Transportation needs:    Medical: Not on file    Non-medical: Not on file  Tobacco Use  . Smoking status: Never Smoker  . Smokeless tobacco: Never Used  Substance and Sexual Activity  . Alcohol use: Yes    Comment: OCCAS  . Drug use: No  . Sexual activity: Not Currently  Lifestyle  . Physical activity:    Days per week: Not on file    Minutes per session: Not on file  . Stress: Not on file  Relationships  . Social connections:    Talks on phone: Not on file    Gets together: Not on file    Attends religious service: Not on file    Active member of club or organization: Not on file    Attends meetings of clubs or organizations: Not on file    Relationship status: Not on file  . Intimate partner violence:    Fear of current or ex partner: Not on file    Emotionally abused: Not on file    Physically abused: Not on file    Forced sexual activity: Not on file  Other Topics Concern  . Not on file    Social History Narrative   Recently moved with her husband to [place] from [place].   Husband is a retired Pharmacist, community.   No children.   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   The PMH, PSH, Social History, Family History, Medications, and allergies have been reviewed in [facility], and have been updated if relevant.   Review of Systems  Gastrointestinal: Positive for abdominal pain, constipation and nausea. Negative for abdominal distention, anal bleeding, blood in stool, diarrhea, rectal pain and vomiting.  All other systems reviewed and are negative.      Objective:    BP 124/84 (BP Location: Left Arm, Patient Position: Sitting, Cuff Size: Normal)   Pulse 68   Temp 98.2 F (36.8 C) (Oral)   Ht 5\' 3"  (1.6 m)   Wt 144 lb 6.4 oz (65.5 kg)   SpO2 95%   BMI 25.58 kg/m   Wt Readings from Last 3 Encounters:  05/25/18 144 lb 6.4 oz (65.5 kg)  02/09/18 154 lb 6 oz (70 kg)  11/24/17 153 lb (69.4 kg)     Physical Exam Vitals signs and nursing note reviewed.  Constitutional:      General: She is not in acute distress.    Appearance: Normal appearance. She is not ill-appearing.  HENT:     Head: Normocephalic and atraumatic.     Mouth/Throat:     Mouth: Mucous membranes are moist.  Eyes:     Extraocular Movements: Extraocular movements intact.  Cardiovascular:     Rate and Rhythm: Normal rate.     Pulses: Normal pulses.  Pulmonary:     Effort: Pulmonary effort is normal.  Abdominal:     General: Abdomen is flat.     Palpations: Abdomen is soft.  Musculoskeletal: Normal range of motion.        General: No swelling.  Skin:    General: Skin is warm and dry.  Neurological:     General: No focal deficit present.     Mental Status: She is alert.  Psychiatric:        Mood and Affect: Mood normal.        Behavior: Behavior normal.        Thought Content: Thought content normal.        Judgment: Judgment normal.           Assessment & Plan:   Gastroesophageal  reflux disease, esophagitis presence not specified  Chronic constipation  Irritable bowel syndrome with constipation No follow-ups on file.

## 2018-05-26 DIAGNOSIS — C3490 Malignant neoplasm of unspecified part of unspecified bronchus or lung: Secondary | ICD-10-CM

## 2018-05-26 HISTORY — DX: Malignant neoplasm of unspecified part of unspecified bronchus or lung: C34.90

## 2018-06-08 ENCOUNTER — Ambulatory Visit
Admission: RE | Admit: 2018-06-08 | Discharge: 2018-06-08 | Disposition: A | Discharge status: Home / Self Care | Payer: Medicare Other | Source: Ambulatory Visit | Attending: Family Medicine | Admitting: Family Medicine

## 2018-06-08 DIAGNOSIS — C773 Secondary and unspecified malignant neoplasm of axilla and upper limb lymph nodes: Secondary | ICD-10-CM | POA: Diagnosis not present

## 2018-06-08 DIAGNOSIS — N6320 Unspecified lump in the left breast, unspecified quadrant: Secondary | ICD-10-CM

## 2018-06-08 DIAGNOSIS — R928 Other abnormal and inconclusive findings on diagnostic imaging of breast: Secondary | ICD-10-CM | POA: Diagnosis not present

## 2018-06-08 DIAGNOSIS — N6342 Unspecified lump in left breast, subareolar: Secondary | ICD-10-CM | POA: Diagnosis not present

## 2018-06-08 DIAGNOSIS — C50812 Malignant neoplasm of overlapping sites of left female breast: Secondary | ICD-10-CM | POA: Diagnosis not present

## 2018-06-08 DIAGNOSIS — R590 Localized enlarged lymph nodes: Secondary | ICD-10-CM | POA: Diagnosis not present

## 2018-06-08 HISTORY — PX: BREAST BIOPSY: SHX20

## 2018-06-08 HISTORY — PX: AXILLARY LYMPH NODE BIOPSY: SHX5737

## 2018-06-09 ENCOUNTER — Other Ambulatory Visit: Payer: Self-pay | Admitting: Pathology

## 2018-06-09 DIAGNOSIS — Z961 Presence of intraocular lens: Secondary | ICD-10-CM | POA: Diagnosis not present

## 2018-06-10 ENCOUNTER — Telehealth: Payer: Self-pay | Admitting: Family Medicine

## 2018-06-10 ENCOUNTER — Encounter: Payer: Self-pay | Admitting: Family Medicine

## 2018-06-10 ENCOUNTER — Ambulatory Visit (INDEPENDENT_AMBULATORY_CARE_PROVIDER_SITE_OTHER): Payer: Medicare Other | Admitting: Family Medicine

## 2018-06-10 VITALS — BP 98/58 | HR 90 | Temp 98.6°F | Ht 63.0 in | Wt 144.0 lb

## 2018-06-10 DIAGNOSIS — J069 Acute upper respiratory infection, unspecified: Secondary | ICD-10-CM | POA: Diagnosis not present

## 2018-06-10 NOTE — Telephone Encounter (Signed)
Received call from [facility] regarding pts Lt breat biopsy done on 04/09/19. Nurse states they have tried 2x since yesterday afternoon to reach patient to discuss her biopsy result and have not heard back from the patient. 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 Gave patient the phone number to call [phone]) to discuss these results and treatment plan. Pt stated she knows she missed a call and planned to return the call this afternoon. Will cc pts PCP Dr. Deborra Medina on this note as FYI

## 2018-06-10 NOTE — Patient Instructions (Addendum)
Drink plenty of fluids, especially water Use nasal saline spray at least 3 times per day Try Mucinex 1 tab twice per day Try flonase 2 sprays each nostril daily Follow-up if symptoms worsen or do not improve in 5-7 days   Call [phone] to follow-up on breast biopsy results

## 2018-06-10 NOTE — Progress Notes (Signed)
Jamie Matthews is a 78 y.o. female  Chief Complaint  Patient presents with  . Cough    w/  congestion and ear plugging since last Saturday     HPI: Jamie Matthews is a 78 y.o. female here with her husband and complains of runny nose, nasal congestion, ear fullness. + throat-clearing cough. + PND No sore throat. + "slight" fever on Sunday - temp 99.8.  No body ache. + headache.  She has been taking tylenol.   Past Medical History:  Diagnosis Date  . Anxiety   . Arthritis   . Belching   . Bladder disorder    OVERACTIVE  . Bowel dysfunction    BLOCKAGE  . Constipation   . Depression   . Diverticulitis   . Fibromyalgia   . GERD (gastroesophageal reflux disease)   . Hyperlipidemia   . IBS (irritable bowel syndrome)   . Internal hemorrhoids   . Memory deficits   . Pneumonia 11/18/12  . Urinary incontinence   . Vertigo     Past Surgical History:  Procedure Laterality Date  . BLADDER SUSPENSION  2004, 2012  . CATARACT EXTRACTION W/PHACO Right 08/27/2015   Procedure: CATARACT EXTRACTION PHACO AND INTRAOCULAR LENS PLACEMENT (IOC);  Surgeon: Estill Cotta, MD;  Location: [facility] ORS;  Service: Ophthalmology;  Laterality: Right;  [place]   1:00.2 AP%  22.5 CDE  23.67 fluid casette lot #[id number]  exp05/31/2018  . CATARACT EXTRACTION W/PHACO Left 10/15/2015   Procedure: CATARACT EXTRACTION PHACO AND INTRAOCULAR LENS PLACEMENT (IOC);  Surgeon: Estill Cotta, MD;  Location: [facility] ORS;  Service: Ophthalmology;  Laterality: Left;  [place] 01:07 AP% 18.1 CDE 21.57 fluid pack lot # [id number]  . CHOLECYSTECTOMY    . TONSILLECTOMY  1947    Social History   Socioeconomic History  . Marital status: Married    Spouse name: Not on file  . Number of children: Not on file  . Years of education: Not on file  . Highest education level: Not on file  Occupational History  . Occupation: Retired  Scientific laboratory technician  . Financial resource strain: Not on file  . Food  insecurity:    Worry: Not on file    Inability: Not on file  . Transportation needs:    Medical: Not on file    Non-medical: Not on file  Tobacco Use  . Smoking status: Never Smoker  . Smokeless tobacco: Never Used  Substance and Sexual Activity  . Alcohol use: Yes    Comment: OCCAS  . Drug use: No  . Sexual activity: Not Currently  Lifestyle  . Physical activity:    Days per week: Not on file    Minutes per session: Not on file  . Stress: Not on file  Relationships  . Social connections:    Talks on phone: Not on file    Gets together: Not on file    Attends religious service: Not on file    Active member of club or organization: Not on file    Attends meetings of clubs or organizations: Not on file    Relationship status: Not on file  . Intimate partner violence:    Fear of current or ex partner: Not on file    Emotionally abused: Not on file    Physically abused: Not on file    Forced sexual activity: Not on file  Other Topics Concern  . Not on file  Social History Narrative   Recently moved with her husband to [facility]  [place] from [place].   Husband is a retired Pharmacist, community.   No children.   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    Family History  Problem Relation Age of Onset  . Cancer Father   . Diabetes Father   . Heart disease Father   . Heart disease Mother   . Breast cancer Sister 13  . Colon cancer Neg Hx   . Esophageal cancer Neg Hx   . Rectal cancer Neg Hx   . Stomach cancer Neg Hx   . Bladder Cancer Neg Hx   . Kidney cancer Neg Hx      Immunization History  Administered Date(s) Administered  . Influenza Split 02/05/2012  . Influenza, High Dose Seasonal PF 03/23/2018  . Influenza,inj,Quad PF,6+ Mos 02/15/2015, 02/24/2017  . Influenza-Unspecified 02/23/2013  . Pneumococcal Conjugate-13 12/13/2013  . Pneumococcal Polysaccharide-23 07/18/2015    Outpatient Encounter Medications as of 06/10/2018  Medication Sig  . atorvastatin (LIPITOR) 20  MG tablet TAKE 1 TABLET DAILY  . buPROPion (WELLBUTRIN XL) 150 MG 24 hr tablet TAKE ONE TABLET BY MOUTH EVERY DAY  . Calcium Carbonate Antacid (ANTACID PO) Take by mouth as needed.  . desonide (DESOWEN) 0.05 % lotion   . DEXILANT 60 MG capsule TAKE 1 CAPSULE BY MOUTH ONCE DAILY  . dextromethorphan-guaiFENesin (MUCINEX DM) 30-600 MG 12hr tablet Take 1 tablet by mouth 2 (two) times daily.  . diphenhydrAMINE (BENADRYL) 25 MG tablet Take 25 mg by mouth every 8 (eight) hours as needed.   . famotidine (PEPCID) 20 MG tablet Take 20 mg by mouth 2 (two) times daily.  [name] ketotifen (ZADITOR) 0.025 % ophthalmic solution 1 drop 2 (two) times daily.  [name] lubiprostone (AMITIZA) 24 MCG capsule TAKE ONE CAPSULE TWICE A DAY WITH MEALS  . MAGNESIUM PO Take by mouth.  . memantine (NAMENDA) 10 MG tablet TAKE 1 TABLET BY MOUTH TWICE DAILY  . mometasone (ELOCON) 0.1 % cream Apply 1 application topically daily.  . Multiple Vitamins-Minerals (CENTRUM PO) Take one by mouth daily  . MYRBETRIQ 50 MG TB24 tablet TAKE 1 TABLET BY MOUTH DAILY  . Omega-3 Fatty Acids (FISH OIL) 600 MG CAPS Take by mouth.  . polyethylene glycol (MIRALAX / GLYCOLAX) packet Take 17 g by mouth daily.  . solifenacin (VESICARE) 5 MG tablet Take 1 tablet (5 mg total) by mouth daily.  . TOVIAZ 4 MG TB24 tablet TAKE 1 TABLET BY MOUTH DAILY  . venlafaxine XR (EFFEXOR-XR) 150 MG 24 hr capsule TAKE 1 CAPSULE BY MOUTH DAILY   No facility-administered encounter medications on file as of 06/10/2018.      ROS: Pertinent positives and negatives noted in HPI. Remainder of ROS non-contributory    Allergies  Allergen Reactions  . Sulfa Antibiotics Rash    BP (!) 98/58   Pulse 90   Temp 98.6 F (37 C) (Oral)   Ht 5\' 3"  (1.6 m)   Wt 144 lb (65.3 kg)   SpO2 96%   BMI 25.51 kg/m   Physical Exam  Constitutional: She is oriented to person, place, and time. She appears well-developed and well-nourished. No distress.  HENT:  Head: Normocephalic and  atraumatic.  Right Ear: Tympanic membrane and ear canal normal. No middle ear effusion.  Left Ear: Tympanic membrane and ear canal normal.  No middle ear effusion.  Nose: Mucosal edema and rhinorrhea present. Right sinus exhibits no maxillary sinus tenderness and no frontal sinus tenderness. Left sinus exhibits maxillary  sinus tenderness. Left sinus exhibits no frontal sinus tenderness.  Mouth/Throat: Mucous membranes are normal. Posterior oropharyngeal erythema present. No oropharyngeal exudate or posterior oropharyngeal edema.  Eyes: Pupils are equal, round, and reactive to light. Conjunctivae and EOM are normal. Right eye exhibits no discharge. Left eye exhibits no discharge.  Neck: Neck supple.  Cardiovascular: Normal rate, regular rhythm and normal heart sounds.  Pulmonary/Chest: Effort normal and breath sounds normal. No respiratory distress. She has no wheezes.  Lymphadenopathy:    She has no cervical adenopathy.  Neurological: She is alert and oriented to person, place, and time.     A/P:  1. Viral URI - cont supportive care to include increased fluids, rest, tylenol or ibuprofen PRN - add nasal saline at least 3x/day, flonase 2 sprays each nostril daily, and mucinex BID - if symptoms worsen or do not start to improve in about 5 days, pt advised to f/u Discussed plan and reviewed medications with patient, including risks, benefits, and potential side effects. Pt expressed understand. All questions answered.

## 2018-06-14 ENCOUNTER — Encounter: Payer: Self-pay | Admitting: *Deleted

## 2018-06-14 ENCOUNTER — Other Ambulatory Visit: Payer: Self-pay | Admitting: Pathology

## 2018-06-14 DIAGNOSIS — C50912 Malignant neoplasm of unspecified site of left female breast: Secondary | ICD-10-CM

## 2018-06-14 LAB — SURGICAL PATHOLOGY

## 2018-06-14 NOTE — Progress Notes (Signed)
  Oncology Nurse Navigator Documentation  Navigator Location: [facility] (06/14/18 1000)   )Navigator Encounter Type: Introductory phone call (06/14/18 1000)   Abnormal Finding Date: 05/06/19 (06/14/18 1000) Confirmed Diagnosis Date: 06/10/18 (06/14/18 1000)                   Barriers/Navigation Needs: Education;Coordination of Care (06/14/18 1000)   Interventions: Coordination of Care (06/14/18 1000)                      Time Spent with Patient: 30 (06/14/18 1000)   Patient called me today to establish navigation services.  She was notified by the radiologist on Friday of her new diagnosis of left invasive breast cancer.  She requested to see Dr. Bary Castilla for surgical consultation.  Informed patient he is out of the office for a couple of weeks.  She does not want to wait, and agrees to see another physician in the practice.  I have scheduled her to see Dr. Dahlia Byes on Wednesday 06/16/18 @ 2:15 and Dr. Tasia Catchings for medical oncology consult at 11:15 the same day..  Will give educational literature to patient at her medical oncology consultation.  She is to call if she has any questions or needs.

## 2018-06-15 ENCOUNTER — Other Ambulatory Visit: Payer: Self-pay | Admitting: *Deleted

## 2018-06-16 ENCOUNTER — Other Ambulatory Visit: Payer: Self-pay

## 2018-06-16 ENCOUNTER — Encounter: Payer: Self-pay | Admitting: Oncology

## 2018-06-16 ENCOUNTER — Inpatient Hospital Stay: Payer: Medicare Other

## 2018-06-16 ENCOUNTER — Ambulatory Visit (INDEPENDENT_AMBULATORY_CARE_PROVIDER_SITE_OTHER): Payer: Medicare Other | Admitting: Surgery

## 2018-06-16 ENCOUNTER — Encounter: Payer: Self-pay | Admitting: *Deleted

## 2018-06-16 ENCOUNTER — Encounter (INDEPENDENT_AMBULATORY_CARE_PROVIDER_SITE_OTHER): Payer: Self-pay

## 2018-06-16 ENCOUNTER — Inpatient Hospital Stay: Payer: Medicare Other | Attending: Oncology | Admitting: Oncology

## 2018-06-16 ENCOUNTER — Encounter: Payer: Self-pay | Admitting: Surgery

## 2018-06-16 VITALS — BP 91/51 | HR 64 | Temp 96.2°F | Resp 18 | Ht 64.0 in | Wt 147.2 lb

## 2018-06-16 VITALS — BP 112/73 | HR 66 | Temp 97.5°F | Resp 14 | Ht 64.0 in | Wt 148.0 lb

## 2018-06-16 DIAGNOSIS — Z170 Estrogen receptor positive status [ER+]: Secondary | ICD-10-CM

## 2018-06-16 DIAGNOSIS — R748 Abnormal levels of other serum enzymes: Secondary | ICD-10-CM | POA: Diagnosis not present

## 2018-06-16 DIAGNOSIS — Z66 Do not resuscitate: Secondary | ICD-10-CM | POA: Insufficient documentation

## 2018-06-16 DIAGNOSIS — C779 Secondary and unspecified malignant neoplasm of lymph node, unspecified: Secondary | ICD-10-CM

## 2018-06-16 DIAGNOSIS — C773 Secondary and unspecified malignant neoplasm of axilla and upper limb lymph nodes: Secondary | ICD-10-CM | POA: Diagnosis not present

## 2018-06-16 DIAGNOSIS — Z79899 Other long term (current) drug therapy: Secondary | ICD-10-CM | POA: Diagnosis not present

## 2018-06-16 DIAGNOSIS — Z8249 Family history of ischemic heart disease and other diseases of the circulatory system: Secondary | ICD-10-CM | POA: Diagnosis not present

## 2018-06-16 DIAGNOSIS — C50912 Malignant neoplasm of unspecified site of left female breast: Secondary | ICD-10-CM

## 2018-06-16 DIAGNOSIS — Z803 Family history of malignant neoplasm of breast: Secondary | ICD-10-CM | POA: Insufficient documentation

## 2018-06-16 DIAGNOSIS — R918 Other nonspecific abnormal finding of lung field: Secondary | ICD-10-CM | POA: Diagnosis not present

## 2018-06-16 DIAGNOSIS — C50012 Malignant neoplasm of nipple and areola, left female breast: Secondary | ICD-10-CM

## 2018-06-16 LAB — COMPREHENSIVE METABOLIC PANEL
ALT: 25 U/L (ref 0–44)
AST: 17 U/L (ref 15–41)
Albumin: 3.8 g/dL (ref 3.5–5.0)
Alkaline Phosphatase: 129 U/L — ABNORMAL HIGH (ref 38–126)
Anion gap: 7 (ref 5–15)
BUN: 20 mg/dL (ref 8–23)
CO2: 28 mmol/L (ref 22–32)
Calcium: 8.7 mg/dL — ABNORMAL LOW (ref 8.9–10.3)
Chloride: 105 mmol/L (ref 98–111)
Creatinine, Ser: 0.93 mg/dL (ref 0.44–1.00)
GFR calc non Af Amer: 58 mL/min — ABNORMAL LOW (ref >60)
Glucose, Bld: 122 mg/dL — ABNORMAL HIGH (ref 70–99)
Potassium: 3.9 mmol/L (ref 3.5–5.1)
SODIUM: 140 mmol/L (ref 135–145)
Total Bilirubin: 0.5 mg/dL (ref 0.3–1.2)
Total Protein: 7 g/dL (ref 6.5–8.1)

## 2018-06-16 LAB — CBC WITH DIFFERENTIAL/PLATELET
Abs Immature Granulocytes: 0.15 10*3/uL — ABNORMAL HIGH (ref 0.00–0.07)
Basophils Absolute: 0.1 10*3/uL (ref 0.0–0.1)
Basophils Relative: 1 %
EOS ABS: 0.7 10*3/uL — AB (ref 0.0–0.5)
Eosinophils Relative: 7 %
HCT: 36.7 % (ref 36.0–46.0)
Hemoglobin: 11.7 g/dL — ABNORMAL LOW (ref 12.0–15.0)
Immature Granulocytes: 2 %
Lymphocytes Relative: 16 %
Lymphs Abs: 1.6 10*3/uL (ref 0.7–4.0)
MCH: 30.9 pg (ref 26.0–34.0)
MCHC: 31.9 g/dL (ref 30.0–36.0)
MCV: 96.8 fL (ref 80.0–100.0)
Monocytes Absolute: 1.1 10*3/uL — ABNORMAL HIGH (ref 0.1–1.0)
Monocytes Relative: 11 %
NRBC: 0 % (ref 0.0–0.2)
Neutro Abs: 6.2 10*3/uL (ref 1.7–7.7)
Neutrophils Relative %: 63 %
Platelets: 322 10*3/uL (ref 150–400)
RBC: 3.79 MIL/uL — ABNORMAL LOW (ref 3.87–5.11)
RDW: 13 % (ref 11.5–15.5)
WBC: 9.7 10*3/uL (ref 4.0–10.5)

## 2018-06-16 NOTE — Progress Notes (Signed)
Patient here for initial visit. °

## 2018-06-16 NOTE — Patient Instructions (Addendum)
The patient is aware to call back for any questions or new concerns.  PET scan is 06-21-18.  Follow up 06-23-18 with Dr Dahlia Byes   Implanted [facility] Guide An implanted port is a device that is placed under the skin. It is usually placed in the chest. The device can be used to give IV medicine, to take blood, or for dialysis. You may have an implanted port if:  You need IV medicine that would be irritating to the small veins in your hands or arms.  You need IV medicines, such as antibiotics, for a long period of time.  You need IV nutrition for a long period of time.  You need dialysis. Having a port means that your health care provider will not need to use the veins in your arms for these procedures. You may have fewer limitations when using a port than you would if you used other types of long-term IVs, and you will likely be able to return to normal activities after your incision heals. An implanted port has two main parts:  Reservoir. The reservoir is the part where a needle is inserted to give medicines or draw blood. The reservoir is round. After it is placed, it appears as a small, raised area under your skin.  Catheter. The catheter is a thin, flexible tube that connects the reservoir to a vein. Medicine that is inserted into the reservoir goes into the catheter and then into the vein. How is my port accessed? To access your port:  A numbing cream may be placed on the skin over the port site.  Your health care provider will put on a mask and sterile gloves.  The skin over your port will be cleaned carefully with a germ-killing soap and allowed to dry.  Your health care provider will gently pinch the port and insert a needle into it.  Your health care provider will check for a blood return to make sure the port is in the vein and is not clogged.  If your port needs to remain accessed to get medicine continuously (constant infusion), your health care provider will place a clear  bandage (dressing) over the needle site. The dressing and needle will need to be changed every week, or as told by your health care provider. What is flushing? Flushing helps keep the port from getting clogged. Follow instructions from your health care provider about how and when to flush the port. Ports are usually flushed with saline solution or a medicine called heparin. The need for flushing will depend on how the port is used:  If the port is only used from time to time to give medicines or draw blood, the port may need to be flushed: ? Before and after medicines have been given. ? Before and after blood has been drawn. ? As part of routine maintenance. Flushing may be recommended every 4-6 weeks.  If a constant infusion is running, the port may not need to be flushed.  Throw away any syringes in a disposal container that is meant for sharp items (sharps container). You can buy a sharps container from a pharmacy, or you can make one by using an empty hard plastic bottle with a cover. How long will my port stay implanted? The port can stay in for as long as your health care provider thinks it is needed. When it is time for the port to come out, a surgery will be done to remove it. The surgery will be similar to  the procedure that was done to put the port in. Follow these instructions at home:   Flush your port as told by your health care provider.  If you need an infusion over several days, follow instructions from your health care provider about how to take care of your port site. Make sure you: ? Wash your hands with soap and water before you change your dressing. If soap and water are not available, use alcohol-based hand sanitizer. ? Change your dressing as told by your health care provider. ? Place any used dressings or infusion bags into a plastic bag. Throw that bag in the trash. ? Keep the dressing that covers the needle clean and dry. Do not get it wet. ? Do not use scissors or  sharp objects near the tube. ? Keep the tube clamped, unless it is being used.  Check your port site every day for signs of infection. Check for: ? Redness, swelling, or pain. ? Fluid or blood. ? Pus or a bad smell.  Protect the skin around the port site. ? Avoid wearing bra straps that rub or irritate the site. ? Protect the skin around your port from seat belts. Place a soft pad over your chest if needed.  Bathe or shower as told by your health care provider. The site may get wet as long as you are not actively receiving an infusion.  Return to your normal activities as told by your health care provider. Ask your health care provider what activities are safe for you.  Carry a medical alert card or wear a medical alert bracelet at all times. This will let health care providers know that you have an implanted port in case of an emergency. Get help right away if:  You have redness, swelling, or pain at the port site.  You have fluid or blood coming from your port site.  You have pus or a bad smell coming from the port site.  You have a fever. Summary  Implanted ports are usually placed in the chest for long-term IV access.  Follow instructions from your health care provider about flushing the port and changing bandages (dressings).  Take care of the area around your port by avoiding clothing that puts pressure on the area, and by watching for signs of infection.  Protect the skin around your port from seat belts. Place a soft pad over your chest if needed.  Get help right away if you have a fever or you have redness, swelling, pain, drainage, or a bad smell at the port site. This information is not intended to replace advice given to you by your health care provider. Make sure you discuss any questions you have with your health care provider. Document Released: 05/12/2005 Document Revised: 06/14/2016 Document Reviewed: 06/14/2016 Elsevier Interactive Patient Education  2019  [employer].

## 2018-06-17 ENCOUNTER — Encounter: Payer: Self-pay | Admitting: Surgery

## 2018-06-17 ENCOUNTER — Encounter: Payer: Self-pay | Admitting: *Deleted

## 2018-06-17 LAB — CANCER ANTIGEN 27.29: CA 27.29: 22 U/mL (ref 0.0–38.6)

## 2018-06-17 LAB — CANCER ANTIGEN 15-3: CAN 15 3: 18.5 U/mL (ref 0.0–25.0)

## 2018-06-17 NOTE — Progress Notes (Signed)
  Oncology Nurse Navigator Documentation  Navigator Location: [facility] (06/17/18 1344)   )Navigator Encounter Type: Telephone (06/17/18 1344)                             Interventions: Coordination of Care (06/17/18 1344)   Coordination of Care: Appts (06/17/18 1344)                  Time Spent with Patient: 15 (06/17/18 1344)   Patient called today and wants me to schedule her to see another surgeon.  Asked what other practice could she go to.  Informed her of [facility] surgeons.  States she would like to be seen there.  I have scheduled her an appointment with Dr. Peyton Najjar tomorrow at 8:45.

## 2018-06-17 NOTE — Progress Notes (Signed)
  Oncology Nurse Navigator Documentation  Navigator Location: [facility] (06/17/18 1300) Referral date to RadOnc/MedOnc: 06/16/18 (06/17/18 1300) )Navigator Encounter Type: Initial MedOnc (06/17/18 1300)                       Treatment Phase: Pre-Tx/Tx Discussion (06/17/18 1300) Barriers/Navigation Needs: Education (06/17/18 1300) Education: Newly Diagnosed Cancer Education (06/17/18 1300)                        Time Spent with Patient: 15 (06/17/18 1300)   Met patient during her medical oncology consult with Dr. Marcha Dutton patient breast cancer educational literature, "My Breast Cancer Treatment Handbook" by Josephine Igo, RN.  She is to call if she has any questions or needs.

## 2018-06-17 NOTE — Progress Notes (Signed)
Patient ID: Jamie Matthews, female   DOB: [birth year], 78 y.o.   MRN: [id number]  HPI Jamie Matthews is a 78 y.o. 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 Of note she has no symptoms regarding her breast.  She did have a abnormal mammogram on November 2018 and in 6 months follow-up recommended however the patient did not follow the recommendation.  Was recently underwent a mammogram on December 2019 showing an abnormality on the left breast that prompted an ultrasound and a biopsy.  Please note that I have personally review all those images.  Is a 1.1 cm retroareolar mass in the left breast.  Is also an abnormal lymph node on the left axillary that was biopsied and now shows invasive cancer as well. He was seen today by Dr.Yu have discussed the case with her. Given that her alkaline phosphatase is elevated Dr. Tasia Catchings wants to perform pet scan to rule out any other distant metastatic disease. CBC and her CMP is otherwise normal.  SHe is able to perform than 4 METS of activity without any shortness of breath or chest pain. Anarchy was at age 43 no pregnancy not breast-feeding.  No hormonal therapy.  Menopause at age 22.  Does have a sister with history of breast cancer diagnosed at age 20.  The reports her nipple has been inverted for the last 3 years or so. Denies any chest or breast pain.  No nipple discharge.  No skin changes.  No weight loss.  No fevers or chills.  HPI  Past Medical History:  Diagnosis Date  . Anxiety   . Arthritis   . Belching   . Bladder disorder    OVERACTIVE  . Bowel dysfunction    BLOCKAGE  . Constipation   . Depression   . Diverticulitis   . Fibromyalgia   . GERD (gastroesophageal reflux disease)   . Hyperlipidemia   . IBS (irritable bowel syndrome)   . Internal hemorrhoids   . Memory deficits   . Murmur   . Pneumonia 11/18/12  . Urinary incontinence   .  Vertigo     Past Surgical History:  Procedure Laterality Date  . AXILLARY LYMPH NODE BIOPSY Left 06/08/2018   INVASIVE MAMMARY CARCINOMA  . BLADDER SUSPENSION  2004, 2012  . BREAST BIOPSY Left 06/08/2018   INVASIVE MAMMARY CARCINOMA  . CATARACT EXTRACTION W/PHACO Right 08/27/2015   Procedure: CATARACT EXTRACTION PHACO AND INTRAOCULAR LENS PLACEMENT (IOC);  Surgeon: Estill Cotta, MD;  Location: [facility] ORS;  Service: Ophthalmology;  Laterality: Right;  [place]   1:00.2 AP%  22.5 CDE  23.67 fluid casette lot #[id number]  exp05/31/2018  . CATARACT EXTRACTION W/PHACO Left 10/15/2015   Procedure: CATARACT EXTRACTION PHACO AND INTRAOCULAR LENS PLACEMENT (IOC);  Surgeon: Estill Cotta, MD;  Location: [facility] ORS;  Service: Ophthalmology;  Laterality: Left;  [place] 01:07 AP% 18.1 CDE 21.57 fluid pack lot # [id number]  . CHOLECYSTECTOMY    . COLONOSCOPY  2017  . TONSILLECTOMY  1947    Family History  Problem Relation Age of Onset  . Diabetes Father   . Heart disease Father   . Lymphoma Father   . Heart disease Mother   . Breast cancer Sister 10  . Colon cancer Neg Hx   . Esophageal cancer Neg Hx   . Rectal cancer Neg Hx   . Stomach cancer Neg Hx   . Bladder Cancer Neg Hx   .  Kidney cancer Neg Hx     Social History Social History   Tobacco Use  . Smoking status: Never Smoker  . Smokeless tobacco: Never Used  Substance Use Topics  . Alcohol use: Not Currently    Comment: OCCAS  . Drug use: No    Allergies  Allergen Reactions  . Sulfa Antibiotics Rash    Current Outpatient Medications  Medication Sig Dispense Refill  . atorvastatin (LIPITOR) 20 MG tablet TAKE 1 TABLET DAILY 90 tablet 3  . buPROPion (WELLBUTRIN XL) 150 MG 24 hr tablet TAKE ONE TABLET BY MOUTH EVERY DAY 90 tablet 2  . Calcium Carbonate Antacid (ANTACID PO) Take by mouth as needed.    . desonide (DESOWEN) 0.05 % lotion     . DEXILANT 60 MG capsule TAKE 1 CAPSULE BY MOUTH ONCE DAILY 90 capsule 0  .  dextromethorphan-guaiFENesin (MUCINEX DM) 30-600 MG 12hr tablet Take 1 tablet by mouth 2 (two) times daily.    . diphenhydrAMINE (BENADRYL) 25 MG tablet Take 25 mg by mouth every 8 (eight) hours as needed.     . famotidine (PEPCID) 20 MG tablet Take 20 mg by mouth 2 (two) times daily.    [name] ketoconazole (NIZORAL) 2 % shampoo     . ketotifen (ZADITOR) 0.025 % ophthalmic solution 1 drop 2 (two) times daily.    [name] lubiprostone (AMITIZA) 24 MCG capsule TAKE ONE CAPSULE TWICE A DAY WITH MEALS 60 capsule 3  . MAGNESIUM PO Take by mouth.    . memantine (NAMENDA) 10 MG tablet TAKE 1 TABLET BY MOUTH TWICE DAILY 180 tablet 2  . mometasone (ELOCON) 0.1 % cream Apply 1 application topically daily. 45 g 0  . Multiple Vitamins-Minerals (CENTRUM PO) Take one by mouth daily    . MYRBETRIQ 50 MG TB24 tablet TAKE 1 TABLET BY MOUTH DAILY 90 tablet 1  . polyethylene glycol (MIRALAX / GLYCOLAX) packet Take 17 g by mouth daily.    . solifenacin (VESICARE) 5 MG tablet Take 1 tablet (5 mg total) by mouth daily. 30 tablet 3  . TOVIAZ 4 MG TB24 tablet TAKE 1 TABLET BY MOUTH DAILY 30 each 5  . venlafaxine XR (EFFEXOR-XR) 150 MG 24 hr capsule TAKE 1 CAPSULE BY MOUTH DAILY 90 capsule 1   No current facility-administered medications for this visit.      Review of Systems Full ROS  was asked and was negative except for the information on the HPI  Physical Exam Blood pressure 112/73, pulse 66, temperature (!) 97.5 F (36.4 C), temperature source Skin, resp. rate 14, height '5\' 4"'  (1.626 m), weight 148 lb (67.1 kg), SpO2 97 %. CONSTITUTIONAL: NAD EYES: Pupils are equal, round, and reactive to light, Sclera are non-icteric. EARS, NOSE, MOUTH AND THROAT: The oropharynx is clear. The oral mucosa is pink and moist. Hearing is intact to voice. LYMPH NODES:  Lymph nodes in the neck are normal. RESPIRATORY:  Lungs are clear. There is normal respiratory effort, with equal breath sounds bilaterally, and without pathologic use of  accessory muscles. CARDIOVASCULAR: Heart is regular without murmurs, gallops, or rubs. BREAST: left nipple is retracted, there is a fullness / mass retro areolar area attach to the nipple areola complex, measures aprx 1.5 cm. It is not discrete and difficult to delineate. Right breast w/o lesions. Axilla no discrete or matted adenopathy. I feel some thick node but is mobile. GI: The abdomen is  soft, nontender, and nondistended. There are no palpable masses. There is no hepatosplenomegaly. There  are normal bowel sounds in all quadrants. GU: Rectal deferred.   MUSCULOSKELETAL: Normal muscle strength and tone. No cyanosis or edema.   SKIN: Turgor is good and there are no pathologic skin lesions or ulcers. NEUROLOGIC: Motor and sensation is grossly normal. Cranial nerves are grossly intact. PSYCH:  Oriented to person, place and time. Affect is normal.  Data Reviewed  I have personally reviewed the patient's imaging, laboratory findings and medical records.    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  Given that her alkaline phosphatase is elevated we will wait on the final path results.  Dr. Tasia Catchings will reevaluate her and determine whether or not she wishes to start neoadjuvant chemotherapy.  I also explained to the patient in detail about the surgical management of the breast and axilla and breast cancer.  Options of lumpectomy radiation therapy and mastectomy were discussed with the patient.  Wishes to have breast conservation therapy.  We will see her back in about 1 to 2 weeks after she completes her PET/CT and at that time we will determine whether upfront surgery versus upfront chemotherapy is indicated. Note that I have spent around 60 minutes in this encounter with greater than 50% of the time spent in coordination and counseling of her care Caroleen Hamman, MD FACS General Surgeon 06/17/2018, 10:11 AM

## 2018-06-18 ENCOUNTER — Ambulatory Visit: Payer: Self-pay | Admitting: General Surgery

## 2018-06-18 DIAGNOSIS — C50912 Malignant neoplasm of unspecified site of left female breast: Secondary | ICD-10-CM | POA: Diagnosis not present

## 2018-06-18 DIAGNOSIS — C773 Secondary and unspecified malignant neoplasm of axilla and upper limb lymph nodes: Secondary | ICD-10-CM | POA: Diagnosis not present

## 2018-06-18 NOTE — H&P (Signed)
PATIENT PROFILE: Jamie Matthews is a 78 y.o. female who presents to the Clinic for consultation at the request of Dr. Deborra Medina for evaluation of breast cancer.  PCP:  Lucille Passy, MD  HISTORY OF PRESENT ILLNESS: [name] reports she had a diagnostic mammogram due to left breast induration.  She was found with a left breast 12 o'clock retro areolar region mass and suspicious axillary lymph node. These were biopsied on 06/08/2018 and were found with invasive mammary carcinoma with positive metastatic lymph node on left axilla. Patient denies any breast pain, breast mass. Refers nipple retraction for years.   Family history of breast cancer: sister with breast cacner Family history of other cancers: None Menarche: 78 years old Menopause: at age 89 Used OCP: None Used estrogen and progesterone therapy: None  History of Radiation to the chest: None History of previous biopsy: None  PROBLEM LIST:         Problem List  Date Reviewed: 10/12/2017         Noted   Breast cancer metastasized to axillary lymph node, left (CMS-HCC) 06/18/2018   Primary osteoarthritis of first carpometacarpal joint of right hand 03/19/2016   Primary osteoarthritis of first carpometacarpal joint of left hand 03/19/2016   Diverticulitis 02/24/2016   GERD (gastroesophageal reflux disease) 02/24/2016   Overview    Last Assessment & Plan:  Deteriorated. Again discussed taking Xantax daily. Given GERD diet handout as she admits to being non compliant with diet. Follow up with GI. The patient indicates understanding of these issues and agrees with the plan.      Hyperlipidemia, unspecified 02/24/2016   Overview    Last Assessment & Plan:  Due for labs today. Continue current dose of lipitor.      Major depressive disorder, recurrent, moderate (CMS-HCC) 02/24/2016   Overview    Last Assessment & Plan:  Continue current rxs.      Primary osteoarthritis of left knee 05/30/2015    Ileitis, unspecified 06/22/2014   Overview    Last Assessment & Plan:  Resolved. Will check electrolytes and renal function today.  Tolerating po without difficulty. Call or return to clinic prn if these symptoms worsen or fail to improve as anticipated. The patient indicates understanding of these issues and agrees with the plan.      Depression Unknown      GENERAL REVIEW OF SYSTEMS:   General ROS: negative for - chills, fatigue, fever, weight gain or weight loss Allergy and Immunology ROS: negative for - hives  Hematological and Lymphatic ROS: negative for - bleeding problems or bruising, negative for palpable nodes Endocrine ROS: negative for - heat or cold intolerance, hair changes Respiratory ROS: negative for - cough, shortness of breath or wheezing Cardiovascular ROS: no chest pain or palpitations GI ROS: negative for nausea, vomiting, abdominal pain, diarrhea, constipation Musculoskeletal ROS: negative for - joint swelling or muscle pain Neurological ROS: negative for - confusion, syncope Dermatological ROS: negative for pruritus and rash Psychiatric: negative for anxiety, depression, difficulty sleeping and memory loss  MEDICATIONS: CurrentMedications        Current Outpatient Medications  Medication Sig Dispense Refill  . AMITIZA 24 mcg capsule     . atorvastatin (LIPITOR) 20 MG tablet Take 20 mg by mouth once daily.    [name] buPROPion (WELLBUTRIN XL) 150 MG XL tablet     . CAPEX 0.01 % Sham     . dexlansoprazole (DEXILANT) 60 mg DR capsule Take 60 mg by mouth once daily.    [name]  diphenhydrAMINE (BENADRYL) 25 mg capsule Take 25 mg by mouth nightly as needed for Itching.    . famotidine (PEPCID) 20 MG tablet Take 20 mg by mouth 2 (two) times daily    . fesoterodine (TOVIAZ) 4 mg ER tablet Take 4 mg by mouth once daily     . memantine (NAMENDA) 10 MG tablet Take 10 mg by mouth 2 (two) times daily.    . mirabegron 50 mg Tb24 Take 50 mg by  mouth once daily     . multivitamin tablet Take 1 tablet by mouth once daily    . polyethylene glycol (MIRALAX) packet Take 17 g by mouth once daily. Mix in 4-8ounces of fluid prior to taking.    . solifenacin (VESICARE) 5 MG tablet Take 5 mg by mouth once daily     . triamcinolone (NASACORT AQ) 55 mcg nasal spray Place 2 sprays into both nostrils once daily as needed     . venlafaxine (EFFEXOR-XR) 150 MG XR capsule Take 150 mg by mouth once daily.     No current facility-administered medications for this visit.       ALLERGIES: Sulfa (sulfonamide antibiotics)  PAST MEDICAL HISTORY:     Past Medical History:  Diagnosis Date  . Allergic state   . Depression   . Diverticul disease small and large intestine, no perforati or abscess   . Dysphagia, unspecified(787.20)   . GERD (gastroesophageal reflux disease)   . Hyperlipemia   . IBS (irritable bowel syndrome)   . Memory loss   . Osteoarthritis   . Polyarthralgia, unspecified   . Psoriasis     PAST SURGICAL HISTORY:      Past Surgical History:  Procedure Laterality Date  . Bladder control surgery    . CATARACT EXTRACTION  08/27/2015  . CHOLECYSTECTOMY     around 2000  . Toe surgery       FAMILY HISTORY:      Family History  Problem Relation Age of Onset  . Heart disease Mother   . Lymphoma Father   . Diabetes Father   . Breast cancer Sister   . Clotting disorder Sister      SOCIAL HISTORY: Social History          Socioeconomic History  . Marital status: Married    Spouse name: Bennye Alm  . Number of children: 0  . Years of education: 64  . Highest education level: Not on file  Occupational History  . Occupation: RetiredGames developer  Social Needs  . Financial resource strain: Not on file  . Food insecurity:    Worry: Not on file    Inability: Not on file  . Transportation needs:    Medical: Not on file    Non-medical: Not on file  Tobacco Use  .  Smoking status: Never Smoker  . Smokeless tobacco: Never Used  Substance and Sexual Activity  . Alcohol use: No  . Drug use: No  . Sexual activity: Never    Partners: Male  Other Topics Concern  . Not on file  Social History Narrative  . Not on file      PHYSICAL EXAM:    Vitals:   06/18/18 0843  BP: (!) 78/51  Pulse: 74   Body mass index is 26.04 kg/m. Weight: 66.7 kg (147 lb)   GENERAL: Alert, active, oriented x3  HEENT: Pupils equal reactive to light. Extraocular movements are intact. Sclera clear. Palpebral conjunctiva normal red color.Pharynx clear.  NECK: Supple with no palpable  mass and no adenopathy.  LUNGS: Sound clear with no rales rhonchi or wheezes.  HEART: Regular rhythm S1 and S2 without murmur.  BREAST: right breast normal without mass, skin or nipple changes or axillary nodes. Abnormal induration of the left breast on the lateral aspect of the nipple areola complex.  ABDOMEN: Soft and depressible, nontender with no palpable mass, no hepatomegaly.  EXTREMITIES: Well-developed well-nourished symmetrical with no dependent edema.  NEUROLOGICAL: Awake alert oriented, facial expression symmetrical, moving all extremities.  REVIEW OF DATA: I have reviewed the following data today: No visits with results within 3 Month(s) from this visit.  Latest known visit with results is:  No results found for any previous visit.    I personally evaluated the images of the diagnostic mammogram, and the post biopsy clip placement.   ASSESSMENT: [name] is a 78 y.o. female presenting for consultation for left breast cancer.    Patient was oriented again about the pathology results. Patient has invasive mammary carcinoma with metastasis to axillary lymph node. Patient will have PET scan on Monday due to elevated alk phos and bone pain. Surgical alternatives were discussed with patient including partial vs total mastectomy. Surgical technique and  post operative care was discussed with patient. Patient wishes to be a candidate for breast conservation therapy.   Due to pre operative diagnosis of metastatic breast caner to lymph node, I agree that patient will benefit of Neoadjuvant chemotherapy. Giving neoadjuvant will increase the chances of patient having partial mastectomy with better margins and to try to avoid a complete axillary node dissection. If patient has surgery first, she will not be a candidate for sentinel lymph node biopsy. I oriented the patient about the need of a chemo port. I discussed the risk of surgery with patient including but not limited to: pneumothorax, hemothorax, infection, pain, vein thrombosis, among others.   Breast cancer metastasized to axillary lymph node, left (CMS-HCC) [C50.912, C77.3]  PLAN: 1. Insertion of Port a Cath [phone]) 2. CBC, CMP done on 06/16/2018 3. Do not take aspirin or blood thinner 5 days before procedure 4. Contact us if has any question or concern.   Patient and/or representative verbalized understanding, all questions were answered, and were agreeable with the plan outlined above.   This was a 60 minute encounter and more that 50% of time counseling the patient.   Herbert Pun, MD  Electronically signed by Herbert Pun, MD

## 2018-06-21 ENCOUNTER — Ambulatory Visit
Admission: RE | Admit: 2018-06-21 | Discharge: 2018-06-21 | Disposition: A | Discharge status: Home / Self Care | Payer: Medicare Other | Source: Ambulatory Visit | Attending: Oncology | Admitting: Oncology

## 2018-06-21 DIAGNOSIS — C50912 Malignant neoplasm of unspecified site of left female breast: Secondary | ICD-10-CM | POA: Insufficient documentation

## 2018-06-21 DIAGNOSIS — R590 Localized enlarged lymph nodes: Secondary | ICD-10-CM | POA: Diagnosis not present

## 2018-06-21 DIAGNOSIS — I700 Atherosclerosis of aorta: Secondary | ICD-10-CM | POA: Insufficient documentation

## 2018-06-21 DIAGNOSIS — C779 Secondary and unspecified malignant neoplasm of lymph node, unspecified: Secondary | ICD-10-CM | POA: Diagnosis not present

## 2018-06-21 DIAGNOSIS — R918 Other nonspecific abnormal finding of lung field: Secondary | ICD-10-CM | POA: Insufficient documentation

## 2018-06-21 DIAGNOSIS — N6341 Unspecified lump in right breast, subareolar: Secondary | ICD-10-CM | POA: Diagnosis not present

## 2018-06-21 LAB — GLUCOSE, CAPILLARY: Glucose-Capillary: 94 mg/dL (ref 70–99)

## 2018-06-21 MED ORDER — FLUDEOXYGLUCOSE F - 18 (FDG) INJECTION
7.6900 | Freq: Once | INTRAVENOUS | Status: AC | PRN
  Administered 2018-06-21: 7.69 via INTRAVENOUS

## 2018-06-22 ENCOUNTER — Encounter
Admission: RE | Admit: 2018-06-22 | Discharge: 2018-06-22 | Disposition: A | Discharge status: Home / Self Care | Payer: Medicare Other | Source: Ambulatory Visit | Attending: General Surgery | Admitting: General Surgery

## 2018-06-22 ENCOUNTER — Other Ambulatory Visit: Payer: Self-pay

## 2018-06-22 ENCOUNTER — Telehealth: Payer: Self-pay | Admitting: Oncology

## 2018-06-22 DIAGNOSIS — R918 Other nonspecific abnormal finding of lung field: Secondary | ICD-10-CM

## 2018-06-22 NOTE — Telephone Encounter (Signed)
Case was discussed with patient and her husband.  New lung mass and hilar nodes with high metabolic activity.  Recommend pulmonology evaluation for bronchoscopy of lung mass and hilar node.  Patient and husband agree with plan.

## 2018-06-23 ENCOUNTER — Ambulatory Visit: Admission: RE | Admit: 2018-06-23 | Payer: Medicare Other | Source: Home / Self Care | Admitting: General Surgery

## 2018-06-23 ENCOUNTER — Encounter: Admission: RE | Payer: Self-pay | Source: Home / Self Care

## 2018-06-23 ENCOUNTER — Ambulatory Visit: Payer: Medicare Other | Admitting: Surgery

## 2018-06-23 SURGERY — INSERTION, TUNNELED CENTRAL VENOUS DEVICE, WITH PORT
Anesthesia: General | Laterality: Left

## 2018-06-24 ENCOUNTER — Other Ambulatory Visit: Payer: Self-pay

## 2018-06-24 ENCOUNTER — Encounter: Payer: Self-pay | Admitting: Oncology

## 2018-06-24 ENCOUNTER — Inpatient Hospital Stay (HOSPITAL_BASED_OUTPATIENT_CLINIC_OR_DEPARTMENT_OTHER): Payer: Medicare Other | Admitting: Oncology

## 2018-06-24 VITALS — BP 108/75 | HR 57 | Temp 96.5°F | Resp 18 | Wt 148.5 lb

## 2018-06-24 DIAGNOSIS — Z803 Family history of malignant neoplasm of breast: Secondary | ICD-10-CM | POA: Diagnosis not present

## 2018-06-24 DIAGNOSIS — Z79899 Other long term (current) drug therapy: Secondary | ICD-10-CM | POA: Diagnosis not present

## 2018-06-24 DIAGNOSIS — R748 Abnormal levels of other serum enzymes: Secondary | ICD-10-CM | POA: Diagnosis not present

## 2018-06-24 DIAGNOSIS — C779 Secondary and unspecified malignant neoplasm of lymph node, unspecified: Secondary | ICD-10-CM

## 2018-06-24 DIAGNOSIS — Z66 Do not resuscitate: Secondary | ICD-10-CM | POA: Diagnosis not present

## 2018-06-24 DIAGNOSIS — Z8249 Family history of ischemic heart disease and other diseases of the circulatory system: Secondary | ICD-10-CM

## 2018-06-24 DIAGNOSIS — C773 Secondary and unspecified malignant neoplasm of axilla and upper limb lymph nodes: Secondary | ICD-10-CM | POA: Diagnosis not present

## 2018-06-24 DIAGNOSIS — C50912 Malignant neoplasm of unspecified site of left female breast: Secondary | ICD-10-CM | POA: Diagnosis not present

## 2018-06-24 DIAGNOSIS — R918 Other nonspecific abnormal finding of lung field: Secondary | ICD-10-CM

## 2018-06-24 NOTE — Progress Notes (Signed)
Hematology/Oncology Consult note [facility] Telephone:([phone] [phone]   Patient Care Team: Lucille Passy, MD as PCP - General (Family Medicine) Osborne Oman, MD as Consulting Physician (Obstetrics and Gynecology) Hilarie Fredrickson, Lajuan Lines, MD as Consulting Physician (Gastroenterology)  REFERRING PROVIDER:  CHIEF COMPLAINTS/REASON FOR VISIT:  Evaluation of breast cancer  HISTORY OF PRESENTING ILLNESS:  Jamie Matthews is a  78 y.o.  female with PMH listed below who was referred to me for evaluation of breast cancer 05/05/2018 bilateral diagnostic breast mammogram showed suspicious mass 1.1cm  in the 12:00 retroareolar region of the left breast and the left axillary lymph node. 06/08/2018 patient status post a left breast retroareolar and left axillary lymph node biopsy. Pathology showed invasive mammary carcinoma, no special type, grade 1, left axillary lymph node positive for invasive mammary carcinoma clinically metastatic.  Background lymph node architecture is not identified. ER> 90% PR> 90%, HER-2 negative.  Patient present to establish care for newly diagnosed breast cancer. She has an appointment to see Dr. Dahlia Byes later today.  Denies history of hormone replacement therapy. Family history positive for sister who was diagnosed with breast cancer at age of 28. Recent episode of runny nose, nasal congestion, ear fullness and throat clearing cough. Was diagnosed with viral URI.  Review of Systems  Constitutional: Negative for appetite change, chills, fatigue and fever.  HENT:   Negative for hearing loss and voice change.   Eyes: Negative for eye problems.  Respiratory: Negative for chest tightness and cough.   Cardiovascular: Negative for chest pain.  Gastrointestinal: Negative for abdominal distention, abdominal pain and blood in stool.  Endocrine: Negative for hot flashes.  Genitourinary: Negative for difficulty urinating and  frequency.   Musculoskeletal: Negative for arthralgias.  Skin: Negative for itching and rash.  Neurological: Negative for extremity weakness.  Hematological: Negative for adenopathy.  Psychiatric/Behavioral: Negative for confusion.    MEDICAL HISTORY:  Past Medical History:  Diagnosis Date  . Anxiety   . Arthritis   . Belching   . Bladder disorder    OVERACTIVE  . Bowel dysfunction    BLOCKAGE  . Constipation   . Depression   . Diverticulitis   . Fibromyalgia   . GERD (gastroesophageal reflux disease)   . Hyperlipidemia   . IBS (irritable bowel syndrome)   . Internal hemorrhoids   . Memory deficits   . Murmur   . Pneumonia 11/18/12  . Urinary incontinence   . Vertigo     SURGICAL HISTORY: Past Surgical History:  Procedure Laterality Date  . AXILLARY LYMPH NODE BIOPSY Left 06/08/2018   INVASIVE MAMMARY CARCINOMA  . BLADDER SUSPENSION  2004, 2012  . BREAST BIOPSY Left 06/08/2018   INVASIVE MAMMARY CARCINOMA  . CATARACT EXTRACTION W/PHACO Right 08/27/2015   Procedure: CATARACT EXTRACTION PHACO AND INTRAOCULAR LENS PLACEMENT (IOC);  Surgeon: Estill Cotta, MD;  Location: [facility] ORS;  Service: Ophthalmology;  Laterality: Right;  [place]   1:00.2 AP%  22.5 CDE  23.67 fluid casette lot #[id number]  exp05/31/2018  . CATARACT EXTRACTION W/PHACO Left 10/15/2015   Procedure: CATARACT EXTRACTION PHACO AND INTRAOCULAR LENS PLACEMENT (IOC);  Surgeon: Estill Cotta, MD;  Location: [facility] ORS;  Service: Ophthalmology;  Laterality: Left;  [place] 01:07 AP% 18.1 CDE 21.57 fluid pack lot # [id number]  . CHOLECYSTECTOMY    . COLONOSCOPY  2017  . TONSILLECTOMY  1947    SOCIAL HISTORY: Social History   Socioeconomic History  . Marital status: Married    Spouse name:  Not on file  . Number of children: Not on file  . Years of education: Not on file  . Highest education level: Not on file  Occupational History  . Occupation: Retired  Scientific laboratory technician  . Financial resource strain: Not on file   . Food insecurity:    Worry: Not on file    Inability: Not on file  . Transportation needs:    Medical: Not on file    Non-medical: Not on file  Tobacco Use  . Smoking status: Never Smoker  . Smokeless tobacco: Never Used  Substance and Sexual Activity  . Alcohol use: Not Currently    Comment: OCCAS  . Drug use: No  . Sexual activity: Not Currently  Lifestyle  . Physical activity:    Days per week: Not on file    Minutes per session: Not on file  . Stress: Not on file  Relationships  . Social connections:    Talks on phone: Not on file    Gets together: Not on file    Attends religious service: Not on file    Active member of club or organization: Not on file    Attends meetings of clubs or organizations: Not on file    Relationship status: Not on file  . Intimate partner violence:    Fear of current or ex partner: Not on file    Emotionally abused: Not on file    Physically abused: Not on file    Forced sexual activity: Not on file  Other Topics Concern  . Not on file  Social History Narrative   Recently moved with her husband to [place] from [place].   Husband is a retired Pharmacist, community.   No children.   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    FAMILY HISTORY: Family History  Problem Relation Age of Onset  . Diabetes Father   . Heart disease Father   . Lymphoma Father   . Heart disease Mother   . Breast cancer Sister 45  . Colon cancer Neg Hx   . Esophageal cancer Neg Hx   . Rectal cancer Neg Hx   . Stomach cancer Neg Hx   . Bladder Cancer Neg Hx   . Kidney cancer Neg Hx     ALLERGIES:  is allergic to sulfa antibiotics.  MEDICATIONS:  Current Outpatient Medications  Medication Sig Dispense Refill  . atorvastatin (LIPITOR) 20 MG tablet TAKE 1 TABLET DAILY (Patient taking differently: Take 20 mg by mouth at bedtime. ) 90 tablet 3  . buPROPion (WELLBUTRIN XL) 150 MG 24 hr tablet TAKE ONE TABLET BY MOUTH EVERY DAY (Patient taking differently: Take  150 mg by mouth daily. ) 90 tablet 2  . desonide (DESOWEN) 0.05 % lotion Apply 1 application topically daily. After showering    . DEXILANT 60 MG capsule TAKE 1 CAPSULE BY MOUTH ONCE DAILY (Patient taking differently: Take 60 mg by mouth daily. ) 90 capsule 0  . dextromethorphan-guaiFENesin (MUCINEX DM) 30-600 MG 12hr tablet Take 1 tablet by mouth 2 (two) times daily as needed (congestion/cold).     . diphenhydrAMINE (BENADRYL) 25 MG tablet Take 25 mg by mouth every 8 (eight) hours as needed (allergies).     . famotidine (PEPCID) 20 MG tablet Take 20 mg by mouth 2 (two) times daily.    [name] lubiprostone (AMITIZA) 24 MCG capsule TAKE ONE CAPSULE TWICE A DAY WITH MEALS (Patient taking differently: Take 24 mcg by  mouth 2 (two) times daily. ) 60 capsule 3  . memantine (NAMENDA) 10 MG tablet TAKE 1 TABLET BY MOUTH TWICE DAILY (Patient taking differently: Take 10 mg by mouth 2 (two) times daily. ) 180 tablet 2  . mometasone (ELOCON) 0.1 % cream Apply 1 application topically daily. (Patient taking differently: Apply 1 application topically daily as needed (itching.). ) 45 g 0  . MYRBETRIQ 50 MG TB24 tablet TAKE 1 TABLET BY MOUTH DAILY (Patient taking differently: Take 50 mg by mouth daily. ) 90 tablet 1  . polyethylene glycol (MIRALAX / GLYCOLAX) packet Take 17 g by mouth daily.    . solifenacin (VESICARE) 5 MG tablet Take 1 tablet (5 mg total) by mouth daily. (Patient not taking: Reported on 06/18/2018) 30 tablet 3  . TOVIAZ 4 MG TB24 tablet TAKE 1 TABLET BY MOUTH DAILY (Patient taking differently: Take 4 mg by mouth at bedtime. ) 30 each 5  . venlafaxine XR (EFFEXOR-XR) 150 MG 24 hr capsule TAKE 1 CAPSULE BY MOUTH DAILY (Patient taking differently: Take 150 mg by mouth daily with breakfast. ) 90 capsule 1  . acetaminophen (TYLENOL) 325 MG tablet Take 325 mg by mouth every 6 (six) hours as needed (for pain).    [name] ketoconazole (NIZORAL) 2 % shampoo Apply 1 application topically once a week.     . Multiple  Vitamin (MULTIVITAMIN WITH MINERALS) TABS tablet Take 1 tablet by mouth daily.    [name] triamcinolone (NASACORT) 55 MCG/ACT AERO nasal inhaler Place into the nose.     No current facility-administered medications for this visit.      PHYSICAL EXAMINATION: ECOG PERFORMANCE STATUS: 1 - Symptomatic but completely ambulatory Vitals:   06/16/18 1121  BP: (!) 91/51  Pulse: 64  Resp: 18  Temp: (!) 96.2 F (35.7 C)   Filed Weights   06/16/18 1121  Weight: 147 lb 3.2 oz (66.8 kg)    Physical Exam  Constitutional: She is oriented to person, place, and time. No distress.  HENT:  Head: Normocephalic and atraumatic.  Nose: Nose normal.  Mouth/Throat: Oropharynx is clear and moist. No oropharyngeal exudate.  Eyes: Pupils are equal, round, and reactive to light. EOM are normal. No scleral icterus.  Neck: Normal range of motion. Neck supple.  Cardiovascular: Normal rate and regular rhythm.  No murmur heard. Pulmonary/Chest: Effort normal. No respiratory distress. She has no rales. She exhibits no tenderness.  Abdominal: Soft. She exhibits no distension. There is no abdominal tenderness.  Musculoskeletal: Normal range of motion.        General: No edema.  Neurological: She is alert and oriented to person, place, and time. No cranial nerve deficit. She exhibits normal muscle tone. Coordination normal.  Skin: Skin is warm and dry. She is not diaphoretic. No erythema.  Psychiatric: Affect normal.  Breast exam was performed in seated and lying down position. Left breast palpable retroareolar mass and left axillary lymph nodes. No palpable right breast mass or axillary lymph nodes.   RADIOGRAPHIC STUDIES: I have personally reviewed the radiological images as listed and agreed with the findings in the report.  CMP Latest Ref Rng & Units 06/16/2018  Glucose 70 - 99 mg/dL 122(H)  BUN 8 - 23 mg/dL 20  Creatinine 0.44 - 1.00 mg/dL 0.93  Sodium 135 - 145 mmol/L 140  Potassium 3.5 - 5.1 mmol/L 3.9    Chloride 98 - 111 mmol/L 105  CO2 22 - 32 mmol/L 28  Calcium 8.9 - 10.3 mg/dL 8.7(L)  Total  Protein 6.5 - 8.1 g/dL 7.0  Total Bilirubin 0.3 - 1.2 mg/dL 0.5  Alkaline Phos 38 - 126 U/L 129(H)  AST 15 - 41 U/L 17  ALT 0 - 44 U/L 25   CBC Latest Ref Rng & Units 06/16/2018  WBC 4.0 - 10.5 K/uL 9.7  Hemoglobin 12.0 - 15.0 g/dL 11.7(L)  Hematocrit 36.0 - 46.0 % 36.7  Platelets 150 - 400 K/uL 322    LABORATORY DATA:  I have reviewed the data as listed Lab Results  Component Value Date   WBC 9.7 06/16/2018   HGB 11.7 (L) 06/16/2018   HCT 36.7 06/16/2018   MCV 96.8 06/16/2018   PLT 322 06/16/2018   Recent Labs    11/11/17 1211 06/16/18 1154  NA 142 140  K 4.5 3.9  CL 103 105  CO2 33* 28  GLUCOSE 97 122*  BUN 32* 20  CREATININE 0.98 0.93  CALCIUM 9.5 8.7*  GFRNONAA  --  58*  GFRAA  --  >60  PROT 6.5 7.0  ALBUMIN 4.2 3.8  AST 20 17  ALT 31 25  ALKPHOS 78 129*  BILITOT 0.6 0.5   Iron/TIBC/Ferritin/ %Sat No results found for: IRON, TIBC, FERRITIN, IRONPCTSAT      ASSESSMENT & PLAN:  1. Malignant neoplasm of left female breast, unspecified estrogen receptor status, unspecified site of breast ([place])   2. Regional lymph node metastasis present [facility])   cT1cN1 breast cancer Breast image was independently reviewed and discussed with patient in her husband.  Pathology reports discussed as well. Diagnosis of breast cancer was discussed with patient and her husband. Given that her left axillary lymph node is positive and also completely replaced by metastatic cancer, also having elevated alkaline phosphatase I recommend obtaining PET scan for further staging to determine if any distant metastasis. Further management plan pending final staging. Patient has appointment to see surgery Dr. Estanislado Emms this afternoon.  Check CA 27.29 and CA15.3 Orders Placed This Encounter  Procedures  . NM PET Image Initial (PI) Skull Base To Thigh    Standing Status:   Future    Number of  Occurrences:   1    Standing Expiration Date:   06/17/2019    Order Specific Question:   If indicated for the ordered procedure, I authorize the administration of a radiopharmaceutical per Radiology protocol    Answer:   Yes    Order Specific Question:   Preferred imaging location?    Answer:   [facility]    Order Specific Question:   Radiology Contrast Protocol - do NOT remove file path    Answer:   [email or web address]  . CBC with Differential/Platelet    Standing Status:   Future    Number of Occurrences:   1    Standing Expiration Date:   06/17/2019  . Comprehensive metabolic panel    Standing Status:   Future    Number of Occurrences:   1    Standing Expiration Date:   06/17/2019  . Cancer antigen 27.29    Standing Status:   Future    Number of Occurrences:   1    Standing Expiration Date:   06/17/2019  . Cancer antigen 15-3    Standing Status:   Future    Number of Occurrences:   1    Standing Expiration Date:   06/17/2019    All questions were answered. The patient knows to call the clinic with any problems questions or concerns.  Return of visit:  to be determined.  Thank you for this kind referral and the opportunity to participate in the care of this patient. A copy of today's note is routed to referring provider  Total face to face encounter time for this patient visit was 73mn. >50% of the time was  spent in counseling and coordination of care.    ZEarlie Server MD, PhD Hematology Oncology [facility] PointPager- [phone]/30/2020

## 2018-06-24 NOTE — Progress Notes (Signed)
Hematology/Oncology follow up note [facility] Telephone:([phone] [phone]   Patient Care Team: Lucille Passy, MD as PCP - General (Family Medicine) Harolyn Rutherford Sallyanne Havers, MD as Consulting Physician (Obstetrics and Gynecology) Hilarie Fredrickson, Lajuan Lines, MD as Consulting Physician (Gastroenterology)   CHIEF COMPLAINTS/REASON FOR VISIT:  Evaluation of breast cancer  HISTORY OF PRESENTING ILLNESS:  Jamie Matthews is a  78 y.o.  female with PMH listed below who was referred to me for evaluation of breast cancer 05/05/2018 bilateral diagnostic breast mammogram showed suspicious mass 1.1cm  in the 12:00 retroareolar region of the left breast and the left axillary lymph node. 06/08/2018 patient status post a left breast retroareolar and left axillary lymph node biopsy. Pathology showed invasive mammary carcinoma, no special type, grade 1, left axillary lymph node positive for invasive mammary carcinoma clinically metastatic.  Background lymph node architecture is not identified. ER> 90% PR> 90%, HER-2 negative.  Patient present to establish care for newly diagnosed breast cancer. She has an appointment to see Dr. Dahlia Byes later today.  Denies history of hormone replacement therapy. Family history positive for sister who was diagnosed with breast cancer at age of 70. Recent episode of runny nose, nasal congestion, ear fullness and throat clearing cough. Was diagnosed with viral URI.   INTERVAL HISTORY Jamie Matthews is a 78 y.o. female who has above history reviewed by me today presents for follow up visit for management of newly diagnosed breast cancer.  Patient has had PET scan done which unfortunately showed additional hypermetabolic bilateral hilar lymph nodes as well as left upper lobe lung mass which may represent a focus of metastatic disease from breast, or primary lung neoplasm.  There is multiple additional small pulmonary nodules are  scattered throughout both lungs which are worrisome for metastasis.  Index nodule within the medial right upper lobe measures 7 mm,, peri-broncho-vascular nodule in the right lower lobe measures 1.2 cm.  Review of Systems  Constitutional: Negative for appetite change, chills, fatigue and fever.  HENT:   Negative for hearing loss and voice change.   Eyes: Negative for eye problems.  Respiratory: Negative for chest tightness and cough.   Cardiovascular: Negative for chest pain.  Gastrointestinal: Negative for abdominal distention, abdominal pain and blood in stool.  Endocrine: Negative for hot flashes.  Genitourinary: Negative for difficulty urinating and frequency.   Musculoskeletal: Negative for arthralgias.  Skin: Negative for itching and rash.  Neurological: Negative for extremity weakness.  Hematological: Negative for adenopathy.  Psychiatric/Behavioral: Negative for confusion.    MEDICAL HISTORY:  Past Medical History:  Diagnosis Date  . Anxiety   . Arthritis   . Belching   . Bladder disorder    OVERACTIVE  . Bowel dysfunction    BLOCKAGE  . Constipation   . Depression   . Diverticulitis   . Fibromyalgia   . GERD (gastroesophageal reflux disease)   . Hyperlipidemia   . IBS (irritable bowel syndrome)   . Internal hemorrhoids   . Memory deficits   . Murmur   . Pneumonia 11/18/12  . Urinary incontinence   . Vertigo     SURGICAL HISTORY: Past Surgical History:  Procedure Laterality Date  . AXILLARY LYMPH NODE BIOPSY Left 06/08/2018   INVASIVE MAMMARY CARCINOMA  . BLADDER SUSPENSION  2004, 2012  . BREAST BIOPSY Left 06/08/2018   INVASIVE MAMMARY CARCINOMA  . CATARACT EXTRACTION W/PHACO Right 08/27/2015   Procedure: CATARACT EXTRACTION PHACO AND INTRAOCULAR LENS PLACEMENT (IOC);  Surgeon: Estill Cotta,  MD;  Location: [facility] ORS;  Service: Ophthalmology;  Laterality: Right;  [place]   1:00.2 AP%  22.5 CDE  23.67 fluid casette lot #[id number]  exp05/31/2018  . CATARACT  EXTRACTION W/PHACO Left 10/15/2015   Procedure: CATARACT EXTRACTION PHACO AND INTRAOCULAR LENS PLACEMENT (IOC);  Surgeon: Estill Cotta, MD;  Location: [facility] ORS;  Service: Ophthalmology;  Laterality: Left;  [place] 01:07 AP% 18.1 CDE 21.57 fluid pack lot # [id number]  . CHOLECYSTECTOMY    . COLONOSCOPY  2017  . TONSILLECTOMY  1947    SOCIAL HISTORY: Social History   Socioeconomic History  . Marital status: Married    Spouse name: Not on file  . Number of children: Not on file  . Years of education: Not on file  . Highest education level: Not on file  Occupational History  . Occupation: Retired  Scientific laboratory technician  . Financial resource strain: Not on file  . Food insecurity:    Worry: Not on file    Inability: Not on file  . Transportation needs:    Medical: Not on file    Non-medical: Not on file  Tobacco Use  . Smoking status: Never Smoker  . Smokeless tobacco: Never Used  Substance and Sexual Activity  . Alcohol use: Not Currently    Comment: OCCAS  . Drug use: No  . Sexual activity: Not Currently  Lifestyle  . Physical activity:    Days per week: Not on file    Minutes per session: Not on file  . Stress: Not on file  Relationships  . Social connections:    Talks on phone: Not on file    Gets together: Not on file    Attends religious service: Not on file    Active member of club or organization: Not on file    Attends meetings of clubs or organizations: Not on file    Relationship status: Not on file  . Intimate partner violence:    Fear of current or ex partner: Not on file    Emotionally abused: Not on file    Physically abused: Not on file    Forced sexual activity: Not on file  Other Topics Concern  . Not on file  Social History Narrative   Recently moved with her husband to [place] from [place].   Husband is a retired Pharmacist, community.   No children.   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    FAMILY HISTORY: Family History  Problem Relation Age of  Onset  . Diabetes Father   . Heart disease Father   . Lymphoma Father   . Heart disease Mother   . Breast cancer Sister 22  . Colon cancer Neg Hx   . Esophageal cancer Neg Hx   . Rectal cancer Neg Hx   . Stomach cancer Neg Hx   . Bladder Cancer Neg Hx   . Kidney cancer Neg Hx     ALLERGIES:  is allergic to sulfa antibiotics.  MEDICATIONS:  Current Outpatient Medications  Medication Sig Dispense Refill  . acetaminophen (TYLENOL) 325 MG tablet Take 325 mg by mouth every 6 (six) hours as needed (for pain).    [name] atorvastatin (LIPITOR) 20 MG tablet TAKE 1 TABLET DAILY (Patient taking differently: Take 20 mg by mouth at bedtime. ) 90 tablet 3  . buPROPion (WELLBUTRIN XL) 150 MG 24 hr tablet TAKE ONE TABLET BY MOUTH EVERY DAY (Patient taking differently: Take 150 mg by mouth daily. )  90 tablet 2  . desonide (DESOWEN) 0.05 % lotion Apply 1 application topically daily. After showering    . DEXILANT 60 MG capsule TAKE 1 CAPSULE BY MOUTH ONCE DAILY (Patient taking differently: Take 60 mg by mouth daily. ) 90 capsule 0  . dextromethorphan-guaiFENesin (MUCINEX DM) 30-600 MG 12hr tablet Take 1 tablet by mouth 2 (two) times daily as needed (congestion/cold).     . diphenhydrAMINE (BENADRYL) 25 MG tablet Take 25 mg by mouth every 8 (eight) hours as needed (allergies).     . famotidine (PEPCID) 20 MG tablet Take 20 mg by mouth 2 (two) times daily.    [name] ketoconazole (NIZORAL) 2 % shampoo Apply 1 application topically once a week.     . lubiprostone (AMITIZA) 24 MCG capsule TAKE ONE CAPSULE TWICE A DAY WITH MEALS (Patient taking differently: Take 24 mcg by mouth 2 (two) times daily. ) 60 capsule 3  . memantine (NAMENDA) 10 MG tablet TAKE 1 TABLET BY MOUTH TWICE DAILY (Patient taking differently: Take 10 mg by mouth 2 (two) times daily. ) 180 tablet 2  . mometasone (ELOCON) 0.1 % cream Apply 1 application topically daily. (Patient taking differently: Apply 1 application topically daily as needed  (itching.). ) 45 g 0  . Multiple Vitamin (MULTIVITAMIN WITH MINERALS) TABS tablet Take 1 tablet by mouth daily.    [name] MYRBETRIQ 50 MG TB24 tablet TAKE 1 TABLET BY MOUTH DAILY (Patient taking differently: Take 50 mg by mouth daily. ) 90 tablet 1  . polyethylene glycol (MIRALAX / GLYCOLAX) packet Take 17 g by mouth daily.    . TOVIAZ 4 MG TB24 tablet TAKE 1 TABLET BY MOUTH DAILY (Patient taking differently: Take 4 mg by mouth at bedtime. ) 30 each 5  . venlafaxine XR (EFFEXOR-XR) 150 MG 24 hr capsule TAKE 1 CAPSULE BY MOUTH DAILY (Patient taking differently: Take 150 mg by mouth daily with breakfast. ) 90 capsule 1  . solifenacin (VESICARE) 5 MG tablet Take 1 tablet (5 mg total) by mouth daily. (Patient not taking: Reported on 06/18/2018) 30 tablet 3  . triamcinolone (NASACORT) 55 MCG/ACT AERO nasal inhaler Place into the nose.     No current facility-administered medications for this visit.      PHYSICAL EXAMINATION: ECOG PERFORMANCE STATUS: 1 - Symptomatic but completely ambulatory Vitals:   06/24/18 0850  BP: 108/75  Pulse: (!) 57  Resp: 18  Temp: (!) 96.5 F (35.8 C)   Filed Weights   06/24/18 0850  Weight: 148 lb 8 oz (67.4 kg)    Physical Exam  Constitutional: She is oriented to person, place, and time. No distress.  HENT:  Head: Normocephalic and atraumatic.  Nose: Nose normal.  Mouth/Throat: Oropharynx is clear and moist. No oropharyngeal exudate.  Eyes: Pupils are equal, round, and reactive to light. EOM are normal. No scleral icterus.  Neck: Normal range of motion. Neck supple.  Cardiovascular: Normal rate and regular rhythm.  No murmur heard. Pulmonary/Chest: Effort normal. No respiratory distress. She has no rales. She exhibits no tenderness.  Abdominal: Soft. She exhibits no distension. There is no abdominal tenderness.  Musculoskeletal: Normal range of motion.        General: No edema.  Neurological: She is alert and oriented to person, place, and time. No cranial  nerve deficit. She exhibits normal muscle tone. Coordination normal.  Skin: Skin is warm and dry. She is not diaphoretic. No erythema.  Psychiatric: Affect normal.     CMP Latest Ref Rng &  Units 06/16/2018  Glucose 70 - 99 mg/dL 122(H)  BUN 8 - 23 mg/dL 20  Creatinine 0.44 - 1.00 mg/dL 0.93  Sodium 135 - 145 mmol/L 140  Potassium 3.5 - 5.1 mmol/L 3.9  Chloride 98 - 111 mmol/L 105  CO2 22 - 32 mmol/L 28  Calcium 8.9 - 10.3 mg/dL 8.7(L)  Total Protein 6.5 - 8.1 g/dL 7.0  Total Bilirubin 0.3 - 1.2 mg/dL 0.5  Alkaline Phos 38 - 126 U/L 129(H)  AST 15 - 41 U/L 17  ALT 0 - 44 U/L 25   CBC Latest Ref Rng & Units 06/16/2018  WBC 4.0 - 10.5 K/uL 9.7  Hemoglobin 12.0 - 15.0 g/dL 11.7(L)  Hematocrit 36.0 - 46.0 % 36.7  Platelets 150 - 400 K/uL 322    LABORATORY DATA:  I have reviewed the data as listed Lab Results  Component Value Date   WBC 9.7 06/16/2018   HGB 11.7 (L) 06/16/2018   HCT 36.7 06/16/2018   MCV 96.8 06/16/2018   PLT 322 06/16/2018   Recent Labs    11/11/17 1211 06/16/18 1154  NA 142 140  K 4.5 3.9  CL 103 105  CO2 33* 28  GLUCOSE 97 122*  BUN 32* 20  CREATININE 0.98 0.93  CALCIUM 9.5 8.7*  GFRNONAA  --  58*  GFRAA  --  >60  PROT 6.5 7.0  ALBUMIN 4.2 3.8  AST 20 17  ALT 31 25  ALKPHOS 78 129*  BILITOT 0.6 0.5   Iron/TIBC/Ferritin/ %Sat No results found for: IRON, TIBC, FERRITIN, IRONPCTSAT     RADIOGRAPHIC STUDIES: I have personally reviewed the radiological images as listed and agreed with the findings in the report. 06/21/2018 PET scan.  1. Left subareolar breast lesion with biopsy clip exhibits mild increased uptake with enlarged and hypermetabolic left axillary lymph node. 2. There is a dominant left upper lobe lung mass which exhibits moderate increased uptake and may represent a focus of metastatic disease from breast primary or primary lung neoplasm. Multiple additional small pulmonary nodules are scattered throughout both lungs which  are worrisome for metastasis. 3. Hypermetabolic bilateral hilar lymph nodes concerning for metastatic disease. 4.  Aortic Atherosclerosis (ICD10-I70.0).   ASSESSMENT & PLAN:  1. Lung mass   2. Malignant neoplasm of left female breast, unspecified estrogen receptor status, unspecified site of breast ([place])   3. Regional lymph node metastasis present [facility])   cT1cN1 breast cancer  I called her prior to today's visit and discussed with her about the PET scan result.  The patient request of a face-to-face discussion today. PET scan was independently reviewed and discussed with patient.  We discussed about the concerning PET scan image findings of hypermetabolic lung mass as well as multiple lung nodules, as well as lymphadenopathy.  Discussed with her that although these hypermetabolic findings in the lung could be due to recent upper respiratory infection, most of her symptoms were nasal congestions, throat clearing cough.  No significant lower respiratory symptoms.  Her findings are suspicious for primary lung neoplasm versus metastatic breast cancer.  I recommend patient to establish care with pulmonology for bronchoscopy and biopsy of hilar node and dominant left upper lobe lung mass if feasible.  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  Will discuss with her again after she has additional work up for lung mass.   Orders Placed This Encounter  Procedures  . Ambulatory referral to Pulmonology  Referral Priority:   Routine    Referral Type:   Consultation    Referral Reason:   Specialty Services Required    Referred to Provider:   Sharia Reeve, MD    Requested Specialty:   Pulmonary Disease    Number of Visits Requested:   1    All questions were answered. The patient knows to call the clinic with any problems questions or concerns.  Return of visit: to be determined.    Earlie Server, MD, PhD Hematology Oncology [facility] at  [facility] Pager- [phone] 06/24/2018

## 2018-06-24 NOTE — Progress Notes (Signed)
Patient here for follow up. No changes from last visit.  °

## 2018-06-26 ENCOUNTER — Encounter: Payer: Self-pay | Admitting: Oncology

## 2018-06-26 DIAGNOSIS — Z7189 Other specified counseling: Secondary | ICD-10-CM | POA: Insufficient documentation

## 2018-06-29 ENCOUNTER — Telehealth: Payer: Self-pay | Admitting: *Deleted

## 2018-06-29 ENCOUNTER — Institutional Professional Consult (permissible substitution): Payer: Self-pay | Admitting: Internal Medicine

## 2018-06-29 ENCOUNTER — Encounter: Payer: Self-pay | Admitting: *Deleted

## 2018-06-29 NOTE — Telephone Encounter (Signed)
Patient called reporting she has not heard anything from Pulmonology regarding referral and that everything is on hold until that occurs. Please advise

## 2018-06-29 NOTE — Telephone Encounter (Signed)
Per pulmonology note on referral, Voicemail was left for patient to return call and schedule appt. I contacted patient, notified her and gave her number to pulmonology office for her to schedule appt.

## 2018-06-29 NOTE — Progress Notes (Signed)
  Oncology Nurse Navigator Documentation  Navigator Location: [facility] (06/29/18 1300)   )Navigator Encounter Type: Telephone (06/29/18 1300) Telephone: Incoming Call (06/29/18 1300)                           Interventions: Coordination of Care (06/29/18 1300)                      Time Spent with Patient: 30 (06/29/18 1300)   Patient called stating she was unable to get through to the pulmonologist office to schedule her appointment.  Confirmed referral to Dr. Ashby Dawes with Dr. Tasia Catchings.  Scheduled patient to see him on Friday, July 02, 2018 @ 11:00.  Patient is to call with any questions or needs.

## 2018-07-02 ENCOUNTER — Other Ambulatory Visit
Admission: RE | Admit: 2018-07-02 | Discharge: 2018-07-02 | Disposition: A | Discharge status: Home / Self Care | Payer: Medicare Other | Source: Ambulatory Visit | Attending: Internal Medicine | Admitting: Internal Medicine

## 2018-07-02 ENCOUNTER — Ambulatory Visit (INDEPENDENT_AMBULATORY_CARE_PROVIDER_SITE_OTHER): Payer: Medicare Other | Admitting: Internal Medicine

## 2018-07-02 ENCOUNTER — Other Ambulatory Visit: Payer: Self-pay

## 2018-07-02 ENCOUNTER — Encounter: Payer: Self-pay | Admitting: Internal Medicine

## 2018-07-02 VITALS — BP 110/78 | HR 57 | Ht 63.0 in | Wt 149.2 lb

## 2018-07-02 DIAGNOSIS — R911 Solitary pulmonary nodule: Secondary | ICD-10-CM

## 2018-07-02 DIAGNOSIS — R918 Other nonspecific abnormal finding of lung field: Secondary | ICD-10-CM

## 2018-07-02 NOTE — Progress Notes (Signed)
[facility] Consultation      Assessment and Plan:  Lung mass in the left lung apex. Breast cancer. - Discussed the possible causes of her lung mass which would include metastatic breast cancer versus a primary lung cancer. - We will plan for bronchoscopy, with ENB/possible EBUS to rule out primary or metastatic lung cancer.  Granulomatous lung disease. - Patient has evidence of calcified lung granuloma, which may be contributing to her multiple bilateral lung nodules versus metastatic disease.  Will check rheumatoid factor, ACE level.    Date: 07/02/2018  MRN# [id number] Jamie Matthews 1941-04-02    Jamie Matthews Jamie Matthews is a 78 y.o. old female seen in consultation for chief complaint of:    Chief Complaint  Patient presents with  . Consult    Referred by MD Tasia Catchings. Consult for lung mass. States she had cough for 4 weeks that prompted the Chest CT.     HPI:   Patient is a 78 year old female with recently diagnosed breast cancer. Patient has had PET scan done which unfortunately showed additional hypermetabolic bilateral hilar lymph nodes as well as left upper lobe lung mass which may represent a focus of metastatic disease from breast, or primary lung neoplasm.  There are multiple additional small pulmonary nodules scattered throughout both lungs which are worrisome for metastasis.   She has never been diagnosed with respiratory issues in the past. She has never smoked. She denies any significant second hand exposure. Her sister had breast cancer. Her father had NHL. 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 Last travel out of the [place] was about 10 or more years ago in [place]. They moved her from [place] 7 years ago, no living in [place], [place], or [place].  She does not snore at night.  She is sleepy during the day, she gets 8-9 hours per night.   **CT/PET 06/21/2018 maging personally reviewed>> there is some right paratracheal  lymphadenopathy, minimal subcarinal lymphadenopathy, and left hilar lymphadenopathy.  Bilateral hilar adenopathy lights up on PET scan however this is difficult to visualize due to lack of contrast.  There are multiple right-sided lung nodules, multiple left-sided lung nodules as well as a large left apical irregular lung mass.  There are multiple calcified lung nodules. **CBC 06/16/17>> Abs eos 700  PMHX:   Past Medical History:  Diagnosis Date  . Anxiety   . Arthritis   . Belching   . Bladder disorder    OVERACTIVE  . Bowel dysfunction    BLOCKAGE  . Constipation   . Depression   . Diverticulitis   . Fibromyalgia   . GERD (gastroesophageal reflux disease)   . Hyperlipidemia   . IBS (irritable bowel syndrome)   . Internal hemorrhoids   . Memory deficits   . Murmur   . Pneumonia 11/18/12  . Urinary incontinence   . Vertigo    Surgical Hx:  Past Surgical History:  Procedure Laterality Date  . AXILLARY LYMPH NODE BIOPSY Left 06/08/2018   INVASIVE MAMMARY CARCINOMA  . BLADDER SUSPENSION  2004, 2012  . BREAST BIOPSY Left 06/08/2018   INVASIVE MAMMARY CARCINOMA  . CATARACT EXTRACTION W/PHACO Right 08/27/2015   Procedure: CATARACT EXTRACTION PHACO AND INTRAOCULAR LENS PLACEMENT (IOC);  Surgeon: Estill Cotta, MD;  Location: [facility] ORS;  Service: Ophthalmology;  Laterality: Right;  [place]   1:00.2 AP%  22.5 CDE  23.67 fluid casette lot #[id number]  exp05/31/2018  . CATARACT EXTRACTION W/PHACO Left 10/15/2015   Procedure: CATARACT EXTRACTION PHACO  AND INTRAOCULAR LENS PLACEMENT (IOC);  Surgeon: Estill Cotta, MD;  Location: [facility] ORS;  Service: Ophthalmology;  Laterality: Left;  [place] 01:07 AP% 18.1 CDE 21.57 fluid pack lot # [id number]  . CHOLECYSTECTOMY    . COLONOSCOPY  2017  . TONSILLECTOMY  1947   Family Hx:  Family History  Problem Relation Age of Onset  . Diabetes Father   . Heart disease Father   . Lymphoma Father   . Heart disease Mother   . Breast cancer Sister 41  .  Colon cancer Neg Hx   . Esophageal cancer Neg Hx   . Rectal cancer Neg Hx   . Stomach cancer Neg Hx   . Bladder Cancer Neg Hx   . Kidney cancer Neg Hx    Social Hx:   Social History   Tobacco Use  . Smoking status: Never Smoker  . Smokeless tobacco: Never Used  Substance Use Topics  . Alcohol use: Not Currently    Comment: OCCAS  . Drug use: No   Medication:    Current Outpatient Medications:  .  acetaminophen (TYLENOL) 325 MG tablet, Take 325 mg by mouth every 6 (six) hours as needed (for pain)., Disp: , Rfl:  .  atorvastatin (LIPITOR) 20 MG tablet, TAKE 1 TABLET DAILY (Patient taking differently: Take 20 mg by mouth at bedtime. ), Disp: 90 tablet, Rfl: 3 .  buPROPion (WELLBUTRIN XL) 150 MG 24 hr tablet, TAKE ONE TABLET BY MOUTH EVERY DAY (Patient taking differently: Take 150 mg by mouth daily. ), Disp: 90 tablet, Rfl: 2 .  desonide (DESOWEN) 0.05 % lotion, Apply 1 application topically daily. After showering, Disp: , Rfl:  .  DEXILANT 60 MG capsule, TAKE 1 CAPSULE BY MOUTH ONCE DAILY (Patient taking differently: Take 60 mg by mouth daily. ), Disp: 90 capsule, Rfl: 0 .  dextromethorphan-guaiFENesin (MUCINEX DM) 30-600 MG 12hr tablet, Take 1 tablet by mouth 2 (two) times daily as needed (congestion/cold). , Disp: , Rfl:  .  diphenhydrAMINE (BENADRYL) 25 MG tablet, Take 25 mg by mouth every 8 (eight) hours as needed (allergies). , Disp: , Rfl:  .  famotidine (PEPCID) 20 MG tablet, Take 20 mg by mouth 2 (two) times daily., Disp: , Rfl:  .  ketoconazole (NIZORAL) 2 % shampoo, Apply 1 application topically once a week. , Disp: , Rfl:  .  lubiprostone (AMITIZA) 24 MCG capsule, TAKE ONE CAPSULE TWICE A DAY WITH MEALS (Patient taking differently: Take 24 mcg by mouth 2 (two) times daily. ), Disp: 60 capsule, Rfl: 3 .  memantine (NAMENDA) 10 MG tablet, TAKE 1 TABLET BY MOUTH TWICE DAILY (Patient taking differently: Take 10 mg by mouth 2 (two) times daily. ), Disp: 180 tablet, Rfl: 2 .   mometasone (ELOCON) 0.1 % cream, Apply 1 application topically daily. (Patient taking differently: Apply 1 application topically daily as needed (itching.). ), Disp: 45 g, Rfl: 0 .  Multiple Vitamin (MULTIVITAMIN WITH MINERALS) TABS tablet, Take 1 tablet by mouth daily., Disp: , Rfl:  .  MYRBETRIQ 50 MG TB24 tablet, TAKE 1 TABLET BY MOUTH DAILY (Patient taking differently: Take 50 mg by mouth daily. ), Disp: 90 tablet, Rfl: 1 .  polyethylene glycol (MIRALAX / GLYCOLAX) packet, Take 17 g by mouth daily., Disp: , Rfl:  .  solifenacin (VESICARE) 5 MG tablet, Take 1 tablet (5 mg total) by mouth daily., Disp: 30 tablet, Rfl: 3 .  TOVIAZ 4 MG TB24 tablet, TAKE 1 TABLET BY MOUTH DAILY (Patient taking differently:  Take 4 mg by mouth at bedtime. ), Disp: 30 each, Rfl: 5 .  triamcinolone (NASACORT) 55 MCG/ACT AERO nasal inhaler, Place into the nose., Disp: , Rfl:  .  venlafaxine XR (EFFEXOR-XR) 150 MG 24 hr capsule, TAKE 1 CAPSULE BY MOUTH DAILY (Patient taking differently: Take 150 mg by mouth daily with breakfast. ), Disp: 90 capsule, Rfl: 1   Allergies:  Sulfa antibiotics  Review of Systems: Gen:  Denies  fever, sweats, chills HEENT: Denies blurred vision, double vision. bleeds, sore throat Cvc:  No dizziness, chest pain. Resp:   Denies cough or sputum production, shortness of breath Gi: Denies swallowing difficulty, stomach pain. Gu:  Denies bladder incontinence, burning urine Ext:   No Joint pain, stiffness. Skin: No skin rash,  hives  Endoc:  No polyuria, polydipsia. Psych: No depression, insomnia. Other:  All other systems were reviewed with the patient and were negative other that what is mentioned in the HPI.   Physical Examination:   VS: BP 110/78   Pulse (!) 57   Ht 5\' 3"  (1.6 m)   Wt 149 lb 3.2 oz (67.7 kg)   SpO2 94%   BMI 26.43 kg/m   General Appearance: No distress  Neuro:without focal findings,  speech normal,  HEENT: PERRLA, EOM intact.   Pulmonary: normal breath sounds,  No wheezing.  CardiovascularNormal S1,S2.  No m/r/g.   Abdomen: Benign, Soft, non-tender. Renal:  No costovertebral tenderness  GU:  No performed at this time. Endoc: No evident thyromegaly, no signs of acromegaly. Skin:   warm, no rashes, no ecchymosis  Extremities: normal, no cyanosis, clubbing.  Other findings:    LABORATORY PANEL:   CBC No results for input(s): WBC, HGB, HCT, PLT in the last 168 hours. ------------------------------------------------------------------------------------------------------------------  Chemistries  No results for input(s): NA, K, CL, CO2, GLUCOSE, BUN, CREATININE, CALCIUM, MG, AST, ALT, ALKPHOS, BILITOT in the last 168 hours.  Invalid input(s): GFRCGP ------------------------------------------------------------------------------------------------------------------  Cardiac Enzymes No results for input(s): TROPONINI in the last 168 hours. ------------------------------------------------------------  RADIOLOGY:  No results found.     Thank  you for the consultation and for allowing [facility] to assist in the care of your patient. Our recommendations are noted above.  Please contact us if we can be of further service.   Marda Stalker, M.D., F.C.C.P.  Board Certified in Internal Medicine, Pulmonary Medicine, [place], and Sleep Medicine.  [facility] Office Number: [phone]   07/02/2018

## 2018-07-02 NOTE — H&P (View-Only) (Signed)
[facility] Consultation      Assessment and Plan:  Lung mass in the left lung apex. Breast cancer. - Discussed the possible causes of her lung mass which would include metastatic breast cancer versus a primary lung cancer. - We will plan for bronchoscopy, with ENB/possible EBUS to rule out primary or metastatic lung cancer.  Granulomatous lung disease. - Patient has evidence of calcified lung granuloma, which may be contributing to her multiple bilateral lung nodules versus metastatic disease.  Will check rheumatoid factor, ACE level.    Date: 07/02/2018  MRN# [id number] Toryn Dewalt 1940/08/20    Kizzie Cotten [name] is a 78 y.o. old female seen in consultation for chief complaint of:    Chief Complaint  Patient presents with  . Consult    Referred by MD Tasia Catchings. Consult for lung mass. States she had cough for 4 weeks that prompted the Chest CT.     HPI:   Patient is a 78 year old female with recently diagnosed breast cancer. Patient has had PET scan done which unfortunately showed additional hypermetabolic bilateral hilar lymph nodes as well as left upper lobe lung mass which may represent a focus of metastatic disease from breast, or primary lung neoplasm.  There are multiple additional small pulmonary nodules scattered throughout both lungs which are worrisome for metastasis.   She has never been diagnosed with respiratory issues in the past. She has never smoked. She denies any significant second hand exposure. Her sister had breast cancer. Her father had NHL. 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 Last travel out of the [place] was about 10 or more years ago in [place]. They moved her from [place] 7 years ago, no living in [place], [place], or [place].  She does not snore at night.  She is sleepy during the day, she gets 8-9 hours per night.   **CT/PET 06/21/2018 maging personally reviewed>> there is some right paratracheal  lymphadenopathy, minimal subcarinal lymphadenopathy, and left hilar lymphadenopathy.  Bilateral hilar adenopathy lights up on PET scan however this is difficult to visualize due to lack of contrast.  There are multiple right-sided lung nodules, multiple left-sided lung nodules as well as a large left apical irregular lung mass.  There are multiple calcified lung nodules. **CBC 06/16/17>> Abs eos 700  PMHX:   Past Medical History:  Diagnosis Date  . Anxiety   . Arthritis   . Belching   . Bladder disorder    OVERACTIVE  . Bowel dysfunction    BLOCKAGE  . Constipation   . Depression   . Diverticulitis   . Fibromyalgia   . GERD (gastroesophageal reflux disease)   . Hyperlipidemia   . IBS (irritable bowel syndrome)   . Internal hemorrhoids   . Memory deficits   . Murmur   . Pneumonia 11/18/12  . Urinary incontinence   . Vertigo    Surgical Hx:  Past Surgical History:  Procedure Laterality Date  . AXILLARY LYMPH NODE BIOPSY Left 06/08/2018   INVASIVE MAMMARY CARCINOMA  . BLADDER SUSPENSION  2004, 2012  . BREAST BIOPSY Left 06/08/2018   INVASIVE MAMMARY CARCINOMA  . CATARACT EXTRACTION W/PHACO Right 08/27/2015   Procedure: CATARACT EXTRACTION PHACO AND INTRAOCULAR LENS PLACEMENT (IOC);  Surgeon: Estill Cotta, MD;  Location: [facility] ORS;  Service: Ophthalmology;  Laterality: Right;  [place]   1:00.2 AP%  22.5 CDE  23.67 fluid casette lot #[id number]  exp05/31/2018  . CATARACT EXTRACTION W/PHACO Left 10/15/2015   Procedure: CATARACT EXTRACTION PHACO  AND INTRAOCULAR LENS PLACEMENT (IOC);  Surgeon: Estill Cotta, MD;  Location: [facility] ORS;  Service: Ophthalmology;  Laterality: Left;  [place] 01:07 AP% 18.1 CDE 21.57 fluid pack lot # [id number]  . CHOLECYSTECTOMY    . COLONOSCOPY  2017  . TONSILLECTOMY  1947   Family Hx:  Family History  Problem Relation Age of Onset  . Diabetes Father   . Heart disease Father   . Lymphoma Father   . Heart disease Mother   . Breast cancer Sister 52  .  Colon cancer Neg Hx   . Esophageal cancer Neg Hx   . Rectal cancer Neg Hx   . Stomach cancer Neg Hx   . Bladder Cancer Neg Hx   . Kidney cancer Neg Hx    Social Hx:   Social History   Tobacco Use  . Smoking status: Never Smoker  . Smokeless tobacco: Never Used  Substance Use Topics  . Alcohol use: Not Currently    Comment: OCCAS  . Drug use: No   Medication:    Current Outpatient Medications:  .  acetaminophen (TYLENOL) 325 MG tablet, Take 325 mg by mouth every 6 (six) hours as needed (for pain)., Disp: , Rfl:  .  atorvastatin (LIPITOR) 20 MG tablet, TAKE 1 TABLET DAILY (Patient taking differently: Take 20 mg by mouth at bedtime. ), Disp: 90 tablet, Rfl: 3 .  buPROPion (WELLBUTRIN XL) 150 MG 24 hr tablet, TAKE ONE TABLET BY MOUTH EVERY DAY (Patient taking differently: Take 150 mg by mouth daily. ), Disp: 90 tablet, Rfl: 2 .  desonide (DESOWEN) 0.05 % lotion, Apply 1 application topically daily. After showering, Disp: , Rfl:  .  DEXILANT 60 MG capsule, TAKE 1 CAPSULE BY MOUTH ONCE DAILY (Patient taking differently: Take 60 mg by mouth daily. ), Disp: 90 capsule, Rfl: 0 .  dextromethorphan-guaiFENesin (MUCINEX DM) 30-600 MG 12hr tablet, Take 1 tablet by mouth 2 (two) times daily as needed (congestion/cold). , Disp: , Rfl:  .  diphenhydrAMINE (BENADRYL) 25 MG tablet, Take 25 mg by mouth every 8 (eight) hours as needed (allergies). , Disp: , Rfl:  .  famotidine (PEPCID) 20 MG tablet, Take 20 mg by mouth 2 (two) times daily., Disp: , Rfl:  .  ketoconazole (NIZORAL) 2 % shampoo, Apply 1 application topically once a week. , Disp: , Rfl:  .  lubiprostone (AMITIZA) 24 MCG capsule, TAKE ONE CAPSULE TWICE A DAY WITH MEALS (Patient taking differently: Take 24 mcg by mouth 2 (two) times daily. ), Disp: 60 capsule, Rfl: 3 .  memantine (NAMENDA) 10 MG tablet, TAKE 1 TABLET BY MOUTH TWICE DAILY (Patient taking differently: Take 10 mg by mouth 2 (two) times daily. ), Disp: 180 tablet, Rfl: 2 .   mometasone (ELOCON) 0.1 % cream, Apply 1 application topically daily. (Patient taking differently: Apply 1 application topically daily as needed (itching.). ), Disp: 45 g, Rfl: 0 .  Multiple Vitamin (MULTIVITAMIN WITH MINERALS) TABS tablet, Take 1 tablet by mouth daily., Disp: , Rfl:  .  MYRBETRIQ 50 MG TB24 tablet, TAKE 1 TABLET BY MOUTH DAILY (Patient taking differently: Take 50 mg by mouth daily. ), Disp: 90 tablet, Rfl: 1 .  polyethylene glycol (MIRALAX / GLYCOLAX) packet, Take 17 g by mouth daily., Disp: , Rfl:  .  solifenacin (VESICARE) 5 MG tablet, Take 1 tablet (5 mg total) by mouth daily., Disp: 30 tablet, Rfl: 3 .  TOVIAZ 4 MG TB24 tablet, TAKE 1 TABLET BY MOUTH DAILY (Patient taking differently:  Take 4 mg by mouth at bedtime. ), Disp: 30 each, Rfl: 5 .  triamcinolone (NASACORT) 55 MCG/ACT AERO nasal inhaler, Place into the nose., Disp: , Rfl:  .  venlafaxine XR (EFFEXOR-XR) 150 MG 24 hr capsule, TAKE 1 CAPSULE BY MOUTH DAILY (Patient taking differently: Take 150 mg by mouth daily with breakfast. ), Disp: 90 capsule, Rfl: 1   Allergies:  Sulfa antibiotics  Review of Systems: Gen:  Denies  fever, sweats, chills HEENT: Denies blurred vision, double vision. bleeds, sore throat Cvc:  No dizziness, chest pain. Resp:   Denies cough or sputum production, shortness of breath Gi: Denies swallowing difficulty, stomach pain. Gu:  Denies bladder incontinence, burning urine Ext:   No Joint pain, stiffness. Skin: No skin rash,  hives  Endoc:  No polyuria, polydipsia. Psych: No depression, insomnia. Other:  All other systems were reviewed with the patient and were negative other that what is mentioned in the HPI.   Physical Examination:   VS: BP 110/78   Pulse (!) 57   Ht 5\' 3"  (1.6 m)   Wt 149 lb 3.2 oz (67.7 kg)   SpO2 94%   BMI 26.43 kg/m   General Appearance: No distress  Neuro:without focal findings,  speech normal,  HEENT: PERRLA, EOM intact.   Pulmonary: normal breath sounds,  No wheezing.  CardiovascularNormal S1,S2.  No m/r/g.   Abdomen: Benign, Soft, non-tender. Renal:  No costovertebral tenderness  GU:  No performed at this time. Endoc: No evident thyromegaly, no signs of acromegaly. Skin:   warm, no rashes, no ecchymosis  Extremities: normal, no cyanosis, clubbing.  Other findings:    LABORATORY PANEL:   CBC No results for input(s): WBC, HGB, HCT, PLT in the last 168 hours. ------------------------------------------------------------------------------------------------------------------  Chemistries  No results for input(s): NA, K, CL, CO2, GLUCOSE, BUN, CREATININE, CALCIUM, MG, AST, ALT, ALKPHOS, BILITOT in the last 168 hours.  Invalid input(s): GFRCGP ------------------------------------------------------------------------------------------------------------------  Cardiac Enzymes No results for input(s): TROPONINI in the last 168 hours. ------------------------------------------------------------  RADIOLOGY:  No results found.     Thank  you for the consultation and for allowing [facility] to assist in the care of your patient. Our recommendations are noted above.  Please contact us if we can be of further service.   Marda Stalker, M.D., F.C.C.P.  Board Certified in Internal Medicine, Pulmonary Medicine, [place], and Sleep Medicine.  [facility] Office Number: [phone]   07/02/2018

## 2018-07-02 NOTE — Addendum Note (Signed)
Addended by: Santiago Bur on: 07/02/2018 12:03 PM   Modules accepted: Orders

## 2018-07-02 NOTE — Patient Instructions (Signed)
We will plan for bronchoscopy next week.

## 2018-07-03 LAB — ANGIOTENSIN CONVERTING ENZYME: Angiotensin-Converting Enzyme: 30 U/L (ref 14–82)

## 2018-07-03 LAB — RHEUMATOID FACTOR: Rheumatoid fact SerPl-aCnc: <10 IU/mL (ref 0.0–13.9)

## 2018-07-05 ENCOUNTER — Telehealth: Payer: Self-pay

## 2018-07-05 LAB — QUANTIFERON-TB GOLD PLUS (RQFGPL)
QuantiFERON Mitogen Value: 0.23 IU/mL
QuantiFERON Nil Value: 0.02 IU/mL
QuantiFERON TB1 Ag Value: 0.03 IU/mL
QuantiFERON TB2 Ag Value: 0.02 IU/mL

## 2018-07-05 LAB — QUANTIFERON-TB GOLD PLUS: QUANTIFERON-TB GOLD PLUS: UNDETERMINED

## 2018-07-05 NOTE — Telephone Encounter (Signed)
LM x 2 for patient to call for bronchscopy details.

## 2018-07-05 NOTE — Telephone Encounter (Signed)
LM for patient to call back regarding bronchoscopy procedure.

## 2018-07-06 NOTE — Telephone Encounter (Signed)
Pt given appt details of bronch Phone appt pre-assessment 9-11 on 2/12 2/13 1:15 super D CT 2/14 12 noon bronchoscopy. appt details read back by spouse/pt. Nothing further needed.

## 2018-07-07 ENCOUNTER — Telehealth: Payer: Self-pay

## 2018-07-07 ENCOUNTER — Encounter: Admission: RE | Admit: 2018-07-07 | Payer: Medicare Other | Source: Ambulatory Visit

## 2018-07-07 NOTE — Telephone Encounter (Signed)
No PA required by [facility] for procedure code [phone], [phone] and [phone]. Rhonda J Cobb

## 2018-07-07 NOTE — Telephone Encounter (Signed)
Bronch: 07/09/18@ 1300 Procedure: ENB for left lung mass (CPT 31627)                 EBUS for right lung adenopathy (CPT 74935, 52174) Super D 07/08/18 at 1:15 at outpatient imaging

## 2018-07-08 ENCOUNTER — Other Ambulatory Visit: Payer: Self-pay

## 2018-07-08 ENCOUNTER — Ambulatory Visit
Admission: RE | Admit: 2018-07-08 | Discharge: 2018-07-08 | Disposition: A | Discharge status: Home / Self Care | Payer: Medicare Other | Source: Ambulatory Visit | Attending: Internal Medicine | Admitting: Internal Medicine

## 2018-07-08 ENCOUNTER — Encounter
Admission: RE | Admit: 2018-07-08 | Discharge: 2018-07-08 | Disposition: A | Discharge status: Home / Self Care | Payer: Medicare Other | Source: Ambulatory Visit | Attending: Internal Medicine | Admitting: Internal Medicine

## 2018-07-08 DIAGNOSIS — R918 Other nonspecific abnormal finding of lung field: Secondary | ICD-10-CM | POA: Diagnosis not present

## 2018-07-08 HISTORY — DX: Malignant (primary) neoplasm, unspecified: C80.1

## 2018-07-08 NOTE — Patient Instructions (Signed)
Your procedure is scheduled on: 07-09-18 FRIDAY Report to Same Day Surgery 2nd floor medical mall [facility] Entrance-take elevator on left to 2nd floor.  Check in with surgery information desk.) To find out your arrival time please call [phone] between 1PM - 3PM on 07-08-18 THURSDAY  Remember: Instructions that are not followed completely may result in serious medical risk, up to and including death, or upon the discretion of your surgeon and anesthesiologist your surgery may need to be rescheduled.    _x___ 1. Do not eat food after midnight the night before your procedure. You may drink clear liquids up to 2 hours before you are scheduled to arrive at the hospital for your procedure.  Do not drink clear liquids within 2 hours of your scheduled arrival to the hospital.  Clear liquids include  --Water or Apple juice without pulp  --Clear carbohydrate beverage such as ClearFast or Gatorade  --Black Coffee or Clear Tea (No milk, no creamers, do not add anything to the coffee or Tea   ____Ensure clear carbohydrate drink on the way to the hospital for bariatric patients  ____Ensure clear carbohydrate drink 3 hours before surgery for Dr Dwyane Luo patients if physician instructed.   No gum chewing or hard candies.     __x__ 2. No Alcohol for 24 hours before or after surgery.   __x__3. No Smoking or e-cigarettes for 24 prior to surgery.  Do not use any chewable tobacco products for at least 6 hour prior to surgery   ____  4. Bring all medications with you on the day of surgery if instructed.    __x__ 5. Notify your doctor if there is any change in your medical condition     (cold, fever, infections).    x___6. On the morning of surgery brush your teeth with toothpaste and water.  You may rinse your mouth with mouth wash if you wish.  Do not swallow any toothpaste or mouthwash.   Do not wear jewelry, make-up, hairpins, clips or nail polish.  Do not wear lotions, powders, or perfumes.  You may wear deodorant.  Do not shave 48 hours prior to surgery. Men may shave face and neck.  Do not bring valuables to the hospital.    [facility] is not responsible for any belongings or valuables.               Contacts, dentures or bridgework may not be worn into surgery.  Leave your suitcase in the car. After surgery it may be brought to your room.  For patients admitted to the hospital, discharge time is determined by your treatment team.  _  Patients discharged the day of surgery will not be allowed to drive home.  You will need someone to drive you home and stay with you the night of your procedure.    Please read over the following fact sheets that you were given:   [facility] Preparing for Surgery  _x___ TAKE THE FOLLOWING MEDICATION THE MORNING OF SURGERY WITH A SMALL SIP OF WATER. These include:  1. WELLBUTRIN  2. DEXILANT  3. PEPCID  4. NAMENDA  5. MYRBETRIQ  6. EFFEXOR  ____Fleets enema or Magnesium Citrate as directed.   ____ Use CHG Soap or sage wipes as directed on instruction sheet   ____ Use inhalers on the day of surgery and bring to hospital day of surgery  ____ Stop Metformin and Janumet 2 days prior to surgery.    ____ Take 1/2 of usual  insulin dose the night before surgery and none on the morning surgery.   ____ Follow recommendations from Cardiologist, Pulmonologist or PCP regarding stopping Aspirin, Coumadin, Plavix ,Eliquis, Effient, or Pradaxa, and Pletal.  X____Stop Anti-inflammatories such as Advil, Aleve, Ibuprofen, Motrin, Naproxen, Naprosyn, Goodies powders or aspirin products NOW-OK to take Tylenol    ____ Stop supplements until after surgery.     ____ Bring C-Pap to the hospital.

## 2018-07-09 ENCOUNTER — Ambulatory Visit: Payer: Medicare Other | Admitting: Anesthesiology

## 2018-07-09 ENCOUNTER — Ambulatory Visit: Payer: Medicare Other

## 2018-07-09 ENCOUNTER — Ambulatory Visit
Admission: RE | Admit: 2018-07-09 | Discharge: 2018-07-09 | Disposition: A | Discharge status: Home / Self Care | Payer: Medicare Other | Source: Ambulatory Visit | Attending: Internal Medicine | Admitting: Internal Medicine

## 2018-07-09 ENCOUNTER — Encounter
Admission: RE | Disposition: A | Discharge status: Home / Self Care | Payer: Self-pay | Source: Ambulatory Visit | Attending: Internal Medicine

## 2018-07-09 ENCOUNTER — Encounter: Payer: Self-pay | Admitting: *Deleted

## 2018-07-09 ENCOUNTER — Other Ambulatory Visit: Payer: Self-pay

## 2018-07-09 DIAGNOSIS — R918 Other nonspecific abnormal finding of lung field: Secondary | ICD-10-CM

## 2018-07-09 DIAGNOSIS — Z803 Family history of malignant neoplasm of breast: Secondary | ICD-10-CM | POA: Diagnosis not present

## 2018-07-09 DIAGNOSIS — R011 Cardiac murmur, unspecified: Secondary | ICD-10-CM | POA: Diagnosis not present

## 2018-07-09 DIAGNOSIS — Z961 Presence of intraocular lens: Secondary | ICD-10-CM | POA: Insufficient documentation

## 2018-07-09 DIAGNOSIS — Z7951 Long term (current) use of inhaled steroids: Secondary | ICD-10-CM | POA: Diagnosis not present

## 2018-07-09 DIAGNOSIS — R591 Generalized enlarged lymph nodes: Secondary | ICD-10-CM

## 2018-07-09 DIAGNOSIS — K219 Gastro-esophageal reflux disease without esophagitis: Secondary | ICD-10-CM | POA: Insufficient documentation

## 2018-07-09 DIAGNOSIS — Z9842 Cataract extraction status, left eye: Secondary | ICD-10-CM | POA: Insufficient documentation

## 2018-07-09 DIAGNOSIS — C50912 Malignant neoplasm of unspecified site of left female breast: Secondary | ICD-10-CM | POA: Diagnosis not present

## 2018-07-09 DIAGNOSIS — R599 Enlarged lymph nodes, unspecified: Secondary | ICD-10-CM | POA: Diagnosis not present

## 2018-07-09 DIAGNOSIS — Z9841 Cataract extraction status, right eye: Secondary | ICD-10-CM | POA: Insufficient documentation

## 2018-07-09 DIAGNOSIS — K589 Irritable bowel syndrome without diarrhea: Secondary | ICD-10-CM | POA: Diagnosis not present

## 2018-07-09 DIAGNOSIS — M797 Fibromyalgia: Secondary | ICD-10-CM | POA: Insufficient documentation

## 2018-07-09 DIAGNOSIS — C7802 Secondary malignant neoplasm of left lung: Secondary | ICD-10-CM | POA: Diagnosis not present

## 2018-07-09 DIAGNOSIS — N3281 Overactive bladder: Secondary | ICD-10-CM | POA: Diagnosis not present

## 2018-07-09 DIAGNOSIS — J8410 Pulmonary fibrosis, unspecified: Secondary | ICD-10-CM | POA: Insufficient documentation

## 2018-07-09 DIAGNOSIS — M1990 Unspecified osteoarthritis, unspecified site: Secondary | ICD-10-CM | POA: Diagnosis not present

## 2018-07-09 DIAGNOSIS — C3412 Malignant neoplasm of upper lobe, left bronchus or lung: Secondary | ICD-10-CM | POA: Diagnosis not present

## 2018-07-09 DIAGNOSIS — F329 Major depressive disorder, single episode, unspecified: Secondary | ICD-10-CM | POA: Insufficient documentation

## 2018-07-09 DIAGNOSIS — F419 Anxiety disorder, unspecified: Secondary | ICD-10-CM | POA: Insufficient documentation

## 2018-07-09 DIAGNOSIS — C3490 Malignant neoplasm of unspecified part of unspecified bronchus or lung: Secondary | ICD-10-CM | POA: Diagnosis not present

## 2018-07-09 DIAGNOSIS — Z79899 Other long term (current) drug therapy: Secondary | ICD-10-CM | POA: Insufficient documentation

## 2018-07-09 DIAGNOSIS — E785 Hyperlipidemia, unspecified: Secondary | ICD-10-CM | POA: Diagnosis not present

## 2018-07-09 DIAGNOSIS — Z882 Allergy status to sulfonamides status: Secondary | ICD-10-CM | POA: Diagnosis not present

## 2018-07-09 HISTORY — PX: ELECTROMAGNETIC NAVIGATION BROCHOSCOPY: SHX5369

## 2018-07-09 SURGERY — ELECTROMAGNETIC NAVIGATION BRONCHOSCOPY
Anesthesia: General

## 2018-07-09 MED ORDER — SUGAMMADEX SODIUM 200 MG/2ML IV SOLN
INTRAVENOUS | Status: DC | PRN
  Administered 2018-07-09: 200 mg via INTRAVENOUS

## 2018-07-09 MED ORDER — LACTATED RINGERS IV SOLN
INTRAVENOUS | Status: DC
  Administered 2018-07-09 (×2): via INTRAVENOUS

## 2018-07-09 MED ORDER — CEFAZOLIN SODIUM-DEXTROSE 2-4 GM/100ML-% IV SOLN: 2.0000 g | INTRAVENOUS | Status: DC

## 2018-07-09 MED ORDER — ONDANSETRON HCL 4 MG/2ML IJ SOLN
INTRAMUSCULAR | Status: DC | PRN
  Administered 2018-07-09: 4 mg via INTRAVENOUS

## 2018-07-09 MED ORDER — PROPOFOL 10 MG/ML IV BOLUS
INTRAVENOUS | Status: DC | PRN
  Administered 2018-07-09: 150 mg via INTRAVENOUS
  Administered 2018-07-09: 50 mg via INTRAVENOUS

## 2018-07-09 MED ORDER — LIDOCAINE HCL (PF) 1 % IJ SOLN
INTRAMUSCULAR | Status: AC
  Filled 2018-07-09: qty 5

## 2018-07-09 MED ORDER — FENTANYL CITRATE (PF) 100 MCG/2ML IJ SOLN
INTRAMUSCULAR | Status: DC | PRN
  Administered 2018-07-09: 50 ug via INTRAVENOUS

## 2018-07-09 MED ORDER — PROPOFOL 10 MG/ML IV BOLUS
INTRAVENOUS | Status: AC
  Filled 2018-07-09: qty 20

## 2018-07-09 MED ORDER — EPHEDRINE SULFATE 50 MG/ML IJ SOLN
INTRAMUSCULAR | Status: DC | PRN
  Administered 2018-07-09 (×2): 5 mg via INTRAVENOUS

## 2018-07-09 MED ORDER — LIDOCAINE HCL (CARDIAC) PF 100 MG/5ML IV SOSY
PREFILLED_SYRINGE | INTRAVENOUS | Status: DC | PRN
  Administered 2018-07-09: 100 mg via INTRAVENOUS

## 2018-07-09 MED ORDER — DEXAMETHASONE SODIUM PHOSPHATE 10 MG/ML IJ SOLN
INTRAMUSCULAR | Status: AC
  Filled 2018-07-09: qty 1

## 2018-07-09 MED ORDER — ROCURONIUM BROMIDE 100 MG/10ML IV SOLN
INTRAVENOUS | Status: DC | PRN
  Administered 2018-07-09: 50 mg via INTRAVENOUS

## 2018-07-09 MED ORDER — ONDANSETRON HCL 4 MG/2ML IJ SOLN
INTRAMUSCULAR | Status: AC
  Filled 2018-07-09: qty 2

## 2018-07-09 MED ORDER — ROCURONIUM BROMIDE 50 MG/5ML IV SOLN
INTRAVENOUS | Status: AC
  Filled 2018-07-09: qty 1

## 2018-07-09 MED ORDER — FENTANYL CITRATE (PF) 100 MCG/2ML IJ SOLN
INTRAMUSCULAR | Status: AC
  Filled 2018-07-09: qty 2

## 2018-07-09 MED ORDER — FENTANYL CITRATE (PF) 100 MCG/2ML IJ SOLN: 25.0000 ug | INTRAMUSCULAR | Status: DC | PRN

## 2018-07-09 MED ORDER — ONDANSETRON HCL 4 MG/2ML IJ SOLN: 4.0000 mg | Freq: Once | INTRAMUSCULAR | Status: DC | PRN

## 2018-07-09 MED ORDER — METHOHEXITAL SODIUM 0.5 G IJ SOLR
INTRAMUSCULAR | Status: AC
  Filled 2018-07-09: qty 500

## 2018-07-09 MED ORDER — DEXAMETHASONE SODIUM PHOSPHATE 10 MG/ML IJ SOLN
INTRAMUSCULAR | Status: DC | PRN
  Administered 2018-07-09: 10 mg via INTRAVENOUS

## 2018-07-09 NOTE — Anesthesia Preprocedure Evaluation (Signed)
Anesthesia Evaluation  Patient identified by MRN, date of birth, ID band Patient awake    Reviewed: Allergy & Precautions, NPO status , Patient's Chart, lab work & pertinent test results  History of Anesthesia Complications Negative for: history of anesthetic complications  Airway Mallampati: III  TM Distance: >3 FB Neck ROM: Full    Dental no notable dental hx.    Pulmonary neg sleep apnea, neg COPD,    breath sounds clear to auscultation- rhonchi (-) wheezing      Cardiovascular Exercise Tolerance: Good (-) hypertension(-) CAD, (-) Past MI, (-) Cardiac Stents and (-) CABG  Rhythm:Regular Rate:Normal - Systolic murmurs and - Diastolic murmurs    Neuro/Psych neg Seizures PSYCHIATRIC DISORDERS Anxiety Depression negative neurological ROS     GI/Hepatic Neg liver ROS, GERD  ,  Endo/Other  negative endocrine ROSneg diabetes  Renal/GU negative Renal ROS     Musculoskeletal  (+) Arthritis , Fibromyalgia -  Abdominal (+) - obese,   Peds  Hematology negative hematology ROS (+)   Anesthesia Other Findings Past Medical History: No date: Anxiety No date: Arthritis No date: Belching No date: Bladder disorder     Comment:  OVERACTIVE No date: Bowel dysfunction     Comment:  BLOCKAGE No date: Cancer ([place]) No date: Constipation No date: Depression No date: Diverticulitis No date: Fibromyalgia No date: GERD (gastroesophageal reflux disease) No date: Hyperlipidemia No date: IBS (irritable bowel syndrome) No date: Internal hemorrhoids No date: Memory deficits No date: Murmur     Comment:  asymptomatic 11/18/12: Pneumonia No date: Urinary incontinence No date: Vertigo   Reproductive/Obstetrics                             Anesthesia Physical Anesthesia Plan  ASA: II  Anesthesia Plan: General   Post-op Pain Management:    Induction: Intravenous  PONV Risk Score and Plan: 2 and  Ondansetron, Midazolam and Dexamethasone  Airway Management Planned: Oral ETT  Additional Equipment:   Intra-op Plan:   Post-operative Plan: Extubation in OR  Informed Consent: I have reviewed the patients History and Physical, chart, labs and discussed the procedure including the risks, benefits and alternatives for the proposed anesthesia with the patient or authorized representative who has indicated his/her understanding and acceptance.     Dental advisory given  Plan Discussed with: CRNA and Anesthesiologist  Anesthesia Plan Comments:         Anesthesia Quick Evaluation

## 2018-07-09 NOTE — Interval H&P Note (Signed)
History and Physical Interval Note:  07/09/2018 12:23 PM  Jamie Matthews  has presented today for surgery, with the diagnosis of Right Lung adenopathy  The various methods of treatment have been discussed with the patient and family. After consideration of risks, benefits and other options for treatment, the patient has consented to  Procedure(s): ELECTROMAGNETIC NAVIGATION BRONCHOSCOPY (N/A) as a surgical intervention .  The patient's history has been reviewed, patient examined, no change in status, stable for surgery.  I have reviewed the patient's chart and labs.  Questions were answered to the patient's satisfaction.     CT chest reviewed Large LUL mass and adenopathy Plan for ENB and EBUS   The Risks and Benefits of the ENB and EBUS were explained to patient/family.  I have discussed the risk for Acute Bleeding, increased chance of Infection, increased chance of Respiratory Failure and Cardiac Arrest, increased chance of pneumothorax and collapsed lung, as well as increased Stroke and Death.  I have also explained to avoid all types of NSAIDs 1 week prior to procedure date  to decrease chance of bleeding, and also to avoid food and drinks the midnight prior to procedure.  The procedure consists of a video camera with a light source to be placed and inserted  into the lungs to  look for abnormal tissue and to obtain tissue samples by using needle and biopsy tools.  The patient/family understand the risks and benefits and have agreed to proceed with procedure.   Flora Lipps

## 2018-07-09 NOTE — Discharge Instructions (Signed)

## 2018-07-09 NOTE — Anesthesia Postprocedure Evaluation (Signed)
Anesthesia Post Note  Patient: Jamie Matthews  Procedure(s) Performed: ELECTROMAGNETIC NAVIGATION BRONCHOSCOPY (N/A )  Patient location during evaluation: PACU Anesthesia Type: General Level of consciousness: awake and alert Pain management: pain level controlled Vital Signs Assessment: post-procedure vital signs reviewed and stable Respiratory status: spontaneous breathing and respiratory function stable Cardiovascular status: stable Anesthetic complications: no     Last Vitals:  Vitals:   07/09/18 1133 07/09/18 1440  BP: 128/82 (!) 143/58  Pulse: 69 70  Resp: 18 17  Temp: (!) 36.1 C (!) 36.4 C  SpO2: 94% 95%    Last Pain:  Vitals:   07/09/18 1440  TempSrc:   PainSc: 0-No pain                 KEPHART,WILLIAM K

## 2018-07-09 NOTE — Op Note (Signed)
Electromagnetic Navigation Bronchoscopy: Indication: lung mass/nodule  Preoperative Diagnosis:lung mass Post Procedure Diagnosis:lung mass Consent: Verbal/Written  The Risks and Benefits of the procedure explained to patient/family prior to start of procedure and I have discussed the risk for acute bleeding, increased chance of infection, increased chance of respiratory failure and cardiac arrest and death.  I have also explained to avoid all types of NSAIDs to decrease chance of bleeding, and to avoid food and drinks the midnight prior to procedure.  The procedure consists of a video camera with a light source to be placed and inserted  into the lungs to  look for abnormal tissue and to obtain tissue samples by using needle and biopsy tools.  The patient/family understand the risks and benefits and have agreed to proceed with procedure.   Hand washing performed prior to starting the procedure.   Type of Anesthesia: see Anesthesiology records .   Procedure Performed:  Virtual Bronchoscopy with Multi-planar Image analysis, 3-D reconstruction of coronal, sagittal and multi-planar images for the purposes of planning real-time bronchoscopy using the iLogic Electromagnetic Navigation Bronchoscopy System (superDimension).  Description of Procedure: After obtaining informed consent from the patient, the above sedative and anesthetic measures were carried out, flexible fiberoptic bronchoscope was inserted via Endotracheal tube after patient was intubated by CNA/Anesthesiologist.   The virtual camera was then placed into the central portion of the trachea. The trachea itself was inspected.  The main carina, right and left midstem bronchus and all the segmental and subsegmental airways by virtual bronchoscopy were inspected. The camera was directed to standard registration points at the following centers: main carina, right upper lobe bronchus, right lower lobe bronchus, right middle lobe bronchus,  left upper lobe bronchus, and the left lower lobe bronchus. This data was transferred to the i-Logic ENB system for real-time bronchoscopy.   The scope was then navigated to the LUL for tissue sampling  Specimans Obtained:  Transbronchial Fine Needle Aspirations 21G times:2  Transbronchial Forceps Biopsy times:10   Transbronchial Single Needle Brush:2  Fluoroscopy:  Fluoroscopy was utilized during the course of this procedure to assure that biopsies were taken in a safe manner under fluoroscopic guidance with spot films required.   Complications:None  Estimated Blood Loss: minimal approx 1cc  Monitoring:  The patient was monitored with continuous oximetry and received supplemental nasal cannula oxygen throughout the procedure. In addition, serial blood pressure measurements and continuous electrocardiography showed these physiologic parameters to remain tolerable throughout the procedure.   Assessment and Plan/Additional Comments: Follow up Pathology Reports    Corrin Parker, M.D.  [facility]  Medical Director [place] Director [facility] Cardio-Pulmonary Department

## 2018-07-09 NOTE — Anesthesia Post-op Follow-up Note (Signed)
Anesthesia QCDR form completed.        

## 2018-07-09 NOTE — OR Nursing (Signed)
EKG performed by Glenford Bayley, RN and placed on chart 1154 am

## 2018-07-09 NOTE — Anesthesia Procedure Notes (Signed)
Procedure Name: Intubation Date/Time: 07/09/2018 1:27 PM Performed by: Gunnar Fusi, MD Pre-anesthesia Checklist: Patient identified, Emergency Drugs available, Suction available, Patient being monitored and Timeout performed Patient Re-evaluated:Patient Re-evaluated prior to induction Oxygen Delivery Method: Circle system utilized Preoxygenation: Pre-oxygenation with 100% oxygen Induction Type: IV induction and Cricoid Pressure applied Ventilation: Mask ventilation without difficulty Laryngoscope Size: Mac and 4 Grade View: Grade IV Tube type: Oral Tube size: 8.5 mm Number of attempts: 3 (1st two attempts unsuccessful by Tamala Julian CRNA w/ MAC 3) Airway Equipment and Method: Stylet Placement Confirmation: ETT inserted through vocal cords under direct vision,  positive ETCO2 and breath sounds checked- equal and bilateral Secured at: 21 cm Tube secured with: Tape Dental Injury: Teeth and Oropharynx as per pre-operative assessment  Difficulty Due To: Difficult Airway- due to anterior larynx

## 2018-07-09 NOTE — Transfer of Care (Signed)
Immediate Anesthesia Transfer of Care Note  Patient: Lamoyne Palencia  Procedure(s) Performed: ELECTROMAGNETIC NAVIGATION BRONCHOSCOPY (N/A )  Patient Location: PACU  Anesthesia Type:General  Level of Consciousness: awake, alert , oriented and patient cooperative  Airway & Oxygen Therapy: Patient Spontanous Breathing  Post-op Assessment: Report given to RN, Post -op Vital signs reviewed and stable and Patient moving all extremities  Post vital signs: Reviewed and stable  Last Vitals:  Vitals Value Taken Time  BP 143/58 07/09/2018  2:41 PM  Temp 36.4 C 07/09/2018  2:40 PM  Pulse 74 07/09/2018  2:43 PM  Resp 18 07/09/2018  2:43 PM  SpO2 94 % 07/09/2018  2:43 PM  Vitals shown include unvalidated device data.  Last Pain:  Vitals:   07/09/18 1440  TempSrc:   PainSc: 0-No pain         Complications: No apparent anesthesia complications

## 2018-07-09 NOTE — Op Note (Signed)
PROCEDURE: ENDOBRONCHIAL ULTRASOUND   PROCEDURE DATE: 07/09/2018  TIME:  NAME:  Jamie Matthews  DOB:[birth year]  MRN: [id number] LOC:  [facility]/None    HOSP DAY: @LENGTHOFSTAYDAYS @ CODE STATUS:   Advance Directive Documentation     Most Recent Value  Type of Advance Directive  Healthcare Power of Attorney, Living will  Pre-existing out of facility DNR order (yellow form or pink MOST form)  -  "MOST" Form in Place?  -          Indications/Preliminary Diagnosis:Adenopathy  Consent: (Place X beside choice/s below)  The benefits, risks and possible complications of the procedure were        explained to:  _x__ patient  _x__ patient's family  ___ other:___________  who verbalized understanding and gave:  ___ verbal  ___ written  __x_ verbal and written  ___ telephone  ___ other:________ consent.      Unable to obtain consent; procedure performed on emergent basis.     Other:       PRESEDATION ASSESSMENT: History and Physical has been performed. Patient meds and allergies have been reviewed. Presedation airway examination has been performed and documented. Baseline vital signs, sedation score, oxygenation status, and cardiac rhythm were reviewed. Patient was deemed to be in satisfactory condition to undergo the procedure.    PREMEDICATIONS: SEE ANESTHESIOLOGY RECORDS   Insertion Route (Place X beside choice below)   Nasal   Oral  x Endotracheal Tube   Tracheostomy   INTRAPROCEDURE MEDICATIONS: SEE ANESTHESIOLOGY RECORDS   PROCEDURE DETAILS: Timeout performed and correct patient, name, & ID confirmed. Following prep per Pulmonary policy, appropriate sedation was administered.  I proceeded with introducing the endobronchial [place] scope and findings, technical procedures, and specimen collection as noted below. At the end of exam the scope was withdrawn without incident. Impression and Plan as noted below.   SPECIMENS (Sites): (Place X beside choice below)  Specimens  Description   No Specimens Obtained     Washings    Lavage    Biopsies   x Fine Needle Aspirates 2   Brushings    Sputum    FINDINGS: There was adenopathy noted in RT hilar area however there was significant amount of blood vessels surrounding and invading into adenopathy. I could not biopsy the area due to high risk for bleeding  ESTIMATED BLOOD LOSS: none COMPLICATIONS/RESOLUTION: none       IMPRESSION:POST-PROCEDURE DX: ADENOPATHY    RECOMMENDATION/PLAN:  Follow up Pathology Reports     Corrin Parker, M.D.  [facility]  Medical Director [place] Director [place] Department

## 2018-07-12 ENCOUNTER — Encounter: Payer: Self-pay | Admitting: Internal Medicine

## 2018-07-13 ENCOUNTER — Telehealth: Payer: Self-pay | Admitting: Internal Medicine

## 2018-07-13 MED ORDER — PREDNISONE 20 MG PO TABS: 20.0000 mg | ORAL_TABLET | Freq: Every day | ORAL | 0 refills | Status: DC

## 2018-07-13 NOTE — Addendum Note (Signed)
Addended by: Stephanie Coup on: 07/13/2018 09:24 AM   Modules accepted: Orders

## 2018-07-13 NOTE — Telephone Encounter (Signed)
She has some inflammation of airways Start Prednisone 20 mg daily for 7 days  No results from procedure yet

## 2018-07-13 NOTE — Telephone Encounter (Signed)
Called patient and made aware that she needs to start prednisone 20 mg x 7 days. She has some inflammation of airways. Results of bronch still pending. She will get a call once resulted. Nothing further needed.

## 2018-07-13 NOTE — Telephone Encounter (Signed)
Spoke with patient, she states the blood was bright red but it has slowed down. It started Friday after the procedure and is just a little bit now. She denies SOB.  Also, she would like to know any results you have.

## 2018-07-13 NOTE — Telephone Encounter (Signed)
Duplicate message. 

## 2018-07-14 ENCOUNTER — Other Ambulatory Visit: Payer: Self-pay | Admitting: Pathology

## 2018-07-14 LAB — CYTOLOGY - NON PAP

## 2018-07-14 LAB — SURGICAL PATHOLOGY

## 2018-07-15 ENCOUNTER — Other Ambulatory Visit: Payer: Self-pay | Admitting: Oncology

## 2018-07-15 DIAGNOSIS — C50912 Malignant neoplasm of unspecified site of left female breast: Secondary | ICD-10-CM

## 2018-07-15 DIAGNOSIS — C3490 Malignant neoplasm of unspecified part of unspecified bronchus or lung: Secondary | ICD-10-CM

## 2018-07-16 ENCOUNTER — Ambulatory Visit: Payer: Self-pay | Admitting: *Deleted

## 2018-07-16 ENCOUNTER — Telehealth: Payer: Self-pay | Admitting: *Deleted

## 2018-07-16 MED ORDER — PROMETHAZINE-DM 6.25-15 MG/5ML PO SYRP: 5.0000 mL | ORAL_SOLUTION | Freq: Four times a day (QID) | ORAL | 0 refills | Status: DC | PRN

## 2018-07-16 MED ORDER — BENZONATATE 200 MG PO CAPS: 200.0000 mg | ORAL_CAPSULE | Freq: Two times a day (BID) | ORAL | 0 refills | Status: DC | PRN

## 2018-07-16 NOTE — Telephone Encounter (Signed)
Summary: Cough but cant come in for appt. wants meds   Pt called stating she has a cough that is keeping her up at night. She is unable to come in for an appointment. She would like something called in for her.     Patient states she had Bronchial scope this week and after the procedure- her cough has gotten worse. She is presently taking prednisone and feels it is not helping. Patient is requesting something for her cough- it is keeping her up at night. Patient is aware her provider is not in the office today and would like another provider to review request. Please call her back to let her know recommendations. Patient just diagnosed with lung cancer- she states she can not come to the office- she was just in the office in January. Reason for Disposition . Caller requesting a NON-URGENT new prescription or refill and triager unable to refill per unit policy  Answer Assessment - Initial Assessment Questions 1. ONSET: "When did the cough begin?"      Last week- got worse after procedure 2. SEVERITY: "How bad is the cough today?"      Keeping patient from sleeping 3. RESPIRATORY DISTRESS: "Describe your breathing."      No breathing problems 4. FEVER: "Do you have a fever?" If so, ask: "What is your temperature, how was it measured, and when did it start?"     no 5. SPUTUM: "Describe the color of your sputum" (clear, white, yellow, green)     Clear/slightly yellow 6. HEMOPTYSIS: "Are you coughing up any blood?" If so ask: "How much?" (flecks, streaks, tablespoons, etc.)     Blood in sputum after procedure- it has stopped now 7. CARDIAC HISTORY: "Do you have any history of heart disease?" (e.g., heart attack, congestive heart failure)      no 8. LUNG HISTORY: "Do you have any history of lung disease?"  (e.g., pulmonary embolus, asthma, emphysema)     Lung cancer present 9. PE RISK FACTORS: "Do you have a history of blood clots?" (or: recent major surgery, recent prolonged travel, bedridden)     no 10. OTHER SYMPTOMS: "Do you have any other symptoms?" (e.g., runny nose, wheezing, chest pain)       Nasal drainage 11. PREGNANCY: "Is there any chance you are pregnant?" "When was your last menstrual period?"       n/a 12. TRAVEL: "Have you traveled out of the country in the last month?" (e.g., travel history, exposures)       No travel  Answer Assessment - Initial Assessment Questions 1. SYMPTOMS: "Do you have any symptoms?"     Slightly productive cough 2. SEVERITY: If symptoms are present, ask "Are they mild, moderate or severe?"     Cough is keeping her up at night  Protocols used: MEDICATION QUESTION CALL-A-AH, [place]

## 2018-07-16 NOTE — Telephone Encounter (Signed)
TA-Pt states that she had a bronchoscope this week and has had a cough since/it keeps her up at night/she cannot come to the office/is requesting a prescription for her cough/plz advise/thx dmf

## 2018-07-16 NOTE — Addendum Note (Signed)
Addended by: Marrion Coy on: 07/16/2018 04:39 PM   Modules accepted: Orders

## 2018-07-16 NOTE — Telephone Encounter (Signed)
Of course.  I see that we've given her tessalon in the past.  We can send in rx for tessalon and/or if she wants something that will make her drowsy, we can send in promethazine DM.  I will pend both orders--okay for you to call them in as entered.  I just want her to be cautious with the promethazine- not just with driving but when she gets up at night, etc.

## 2018-07-16 NOTE — Telephone Encounter (Signed)
Rx request 

## 2018-07-16 NOTE — Telephone Encounter (Signed)
Called and she is not available to talk. Husband wants to know when does she come back to see me. [name]

## 2018-07-16 NOTE — Telephone Encounter (Signed)
Discussed with pt/called in both/thx dmf

## 2018-07-16 NOTE — Addendum Note (Signed)
Addended by: Lucille Passy on: 07/16/2018 01:55 PM   Modules accepted: Orders

## 2018-07-16 NOTE — Telephone Encounter (Signed)
Patient called asking doctor to return her call to discuss the MRI ordered. [phone]

## 2018-07-20 ENCOUNTER — Ambulatory Visit
Admission: RE | Admit: 2018-07-20 | Discharge: 2018-07-20 | Disposition: A | Discharge status: Home / Self Care | Payer: Medicare Other | Source: Ambulatory Visit | Attending: Oncology | Admitting: Oncology

## 2018-07-20 DIAGNOSIS — C3490 Malignant neoplasm of unspecified part of unspecified bronchus or lung: Secondary | ICD-10-CM | POA: Insufficient documentation

## 2018-07-20 DIAGNOSIS — C50912 Malignant neoplasm of unspecified site of left female breast: Secondary | ICD-10-CM | POA: Insufficient documentation

## 2018-07-20 DIAGNOSIS — C50919 Malignant neoplasm of unspecified site of unspecified female breast: Secondary | ICD-10-CM | POA: Diagnosis not present

## 2018-07-20 MED ORDER — GADOBUTROL 1 MMOL/ML IV SOLN
6.0000 mL | Freq: Once | INTRAVENOUS | Status: AC | PRN
  Administered 2018-07-20: 6 mL via INTRAVENOUS

## 2018-07-21 ENCOUNTER — Other Ambulatory Visit: Payer: Self-pay

## 2018-07-21 ENCOUNTER — Encounter: Payer: Self-pay | Admitting: *Deleted

## 2018-07-21 ENCOUNTER — Ambulatory Visit: Payer: Self-pay | Admitting: Oncology

## 2018-07-21 ENCOUNTER — Inpatient Hospital Stay: Payer: Medicare Other | Attending: Oncology | Admitting: Oncology

## 2018-07-21 ENCOUNTER — Encounter: Payer: Self-pay | Admitting: Oncology

## 2018-07-21 VITALS — BP 96/58 | HR 75 | Temp 96.1°F | Ht 63.0 in | Wt 148.5 lb

## 2018-07-21 DIAGNOSIS — E785 Hyperlipidemia, unspecified: Secondary | ICD-10-CM | POA: Insufficient documentation

## 2018-07-21 DIAGNOSIS — C3490 Malignant neoplasm of unspecified part of unspecified bronchus or lung: Secondary | ICD-10-CM

## 2018-07-21 DIAGNOSIS — C50912 Malignant neoplasm of unspecified site of left female breast: Secondary | ICD-10-CM

## 2018-07-21 DIAGNOSIS — C3412 Malignant neoplasm of upper lobe, left bronchus or lung: Secondary | ICD-10-CM | POA: Diagnosis not present

## 2018-07-21 DIAGNOSIS — Z79899 Other long term (current) drug therapy: Secondary | ICD-10-CM | POA: Insufficient documentation

## 2018-07-21 DIAGNOSIS — Z803 Family history of malignant neoplasm of breast: Secondary | ICD-10-CM | POA: Diagnosis not present

## 2018-07-21 DIAGNOSIS — Z66 Do not resuscitate: Secondary | ICD-10-CM | POA: Insufficient documentation

## 2018-07-21 DIAGNOSIS — C7800 Secondary malignant neoplasm of unspecified lung: Secondary | ICD-10-CM | POA: Insufficient documentation

## 2018-07-21 DIAGNOSIS — Z170 Estrogen receptor positive status [ER+]: Secondary | ICD-10-CM | POA: Insufficient documentation

## 2018-07-21 DIAGNOSIS — F419 Anxiety disorder, unspecified: Secondary | ICD-10-CM | POA: Insufficient documentation

## 2018-07-21 DIAGNOSIS — K219 Gastro-esophageal reflux disease without esophagitis: Secondary | ICD-10-CM | POA: Diagnosis not present

## 2018-07-21 DIAGNOSIS — R05 Cough: Secondary | ICD-10-CM | POA: Insufficient documentation

## 2018-07-21 DIAGNOSIS — Z7189 Other specified counseling: Secondary | ICD-10-CM

## 2018-07-21 DIAGNOSIS — C773 Secondary and unspecified malignant neoplasm of axilla and upper limb lymph nodes: Secondary | ICD-10-CM

## 2018-07-21 NOTE — Progress Notes (Signed)
Patient here today for follow up.  Patient c/o cough

## 2018-07-21 NOTE — Progress Notes (Signed)
Hematology/Oncology follow up note [facility] [phone] [phone]) [phone]   Patient Care Team: Lucille Passy, MD as PCP - General (Family Medicine) Harolyn Rutherford Sallyanne Havers, MD as Consulting Physician (Obstetrics and Gynecology) Hilarie Fredrickson, Lajuan Lines, MD as Consulting Physician (Gastroenterology) Telford Nab, RN as Registered Nurse   CHIEF COMPLAINTS/REASON FOR VISIT:  Evaluation of breast cancer  HISTORY OF PRESENTING ILLNESS:  Jamie Matthews is a  78 y.o.  female with PMH listed below who was referred to me for evaluation of breast cancer 05/05/2018 bilateral diagnostic breast mammogram showed suspicious mass 1.1cm  in the 12:00 retroareolar region of the left breast and the left axillary lymph node. 06/08/2018 patient status post a left breast retroareolar and left axillary lymph node biopsy. Pathology showed invasive mammary carcinoma, no special type, grade 1, left axillary lymph node positive for invasive mammary carcinoma clinically metastatic.  Background lymph node architecture is not identified. ER> 90% PR> 90%, HER-2 negative.  Patient present to establish care for newly diagnosed breast cancer. She has an appointment to see Dr. Dahlia Byes later today.  Denies history of hormone replacement therapy. Family history positive for sister who was diagnosed with breast cancer at age of 14. Recent episode of runny nose, nasal congestion, ear fullness and throat clearing cough. Was diagnosed with viral URI.   #  PET scan done which unfortunately showed additional hypermetabolic bilateral hilar lymph nodes as well as left upper lobe lung mass which may represent a focus of metastatic disease from breast, or primary lung neoplasm.  There is multiple additional small pulmonary nodules are scattered throughout both lungs which are worrisome for metastasis.  Index nodule within the medial right upper lobe measures 7 mm,, peri-broncho-vascular nodule in the  right lower lobe measures 1.2 cm.  INTERVAL HISTORY Jamie Matthews is a 78 y.o. female who has above history reviewed by me today presents for follow up visit for management of breast cancer and lung cancer. During the interim, patient was referred to pulmonology and had a biopsy via bronchoscopy. Biopsy of lung mass left upper lobe showed non-small cell lung cancer, favor adenocarcinoma. Patient reports chronic cough, denies any hemoptysis, chest pain, shortness of breath, unintentional weight loss. Never smoker or any history of second hand smoke exposure. Family history includes sister was diagnosed with breast cancer at age of 11.   Review of Systems  Constitutional: Negative for appetite change, chills, fatigue and fever.  HENT:   Negative for hearing loss and voice change.   Eyes: Negative for eye problems.  Respiratory: Positive for cough. Negative for chest tightness.   Cardiovascular: Negative for chest pain.  Gastrointestinal: Negative for abdominal distention, abdominal pain and blood in stool.  Endocrine: Negative for hot flashes.  Genitourinary: Negative for difficulty urinating and frequency.   Musculoskeletal: Negative for arthralgias.  Skin: Negative for itching and rash.  Neurological: Negative for extremity weakness.  Hematological: Negative for adenopathy.  Psychiatric/Behavioral: Negative for confusion.    MEDICAL HISTORY:  Past Medical History:  Diagnosis Date  . Anxiety   . Arthritis   . Belching   . Bladder disorder    OVERACTIVE  . Bowel dysfunction    BLOCKAGE  . Cancer ([place])   . Constipation   . Depression   . Diverticulitis   . Fibromyalgia   . GERD (gastroesophageal reflux disease)   . Hyperlipidemia   . IBS (irritable bowel syndrome)   . Internal hemorrhoids   . Memory deficits   . Murmur  asymptomatic  . Pneumonia 11/18/12  . Urinary incontinence   . Vertigo     SURGICAL HISTORY: Past Surgical History:  Procedure  Laterality Date  . AXILLARY LYMPH NODE BIOPSY Left 06/08/2018   INVASIVE MAMMARY CARCINOMA  . BLADDER SUSPENSION  2004, 2012  . BREAST BIOPSY Left 06/08/2018   INVASIVE MAMMARY CARCINOMA  . CATARACT EXTRACTION W/PHACO Right 08/27/2015   Procedure: CATARACT EXTRACTION PHACO AND INTRAOCULAR LENS PLACEMENT (IOC);  Surgeon: Estill Cotta, MD;  Location: [facility] ORS;  Service: Ophthalmology;  Laterality: Right;  [place]   1:00.2 AP%  22.5 CDE  23.67 fluid casette lot #[id number]  exp05/31/2018  . CATARACT EXTRACTION W/PHACO Left 10/15/2015   Procedure: CATARACT EXTRACTION PHACO AND INTRAOCULAR LENS PLACEMENT (IOC);  Surgeon: Estill Cotta, MD;  Location: [facility] ORS;  Service: Ophthalmology;  Laterality: Left;  [place] 01:07 AP% 18.1 CDE 21.57 fluid pack lot # [id number]  . CHOLECYSTECTOMY    . COLONOSCOPY  2017  . ELECTROMAGNETIC NAVIGATION BROCHOSCOPY N/A 07/09/2018   Procedure: ELECTROMAGNETIC NAVIGATION BRONCHOSCOPY;  Surgeon: Flora Lipps, MD;  Location: [facility] ORS;  Service: Cardiopulmonary;  Laterality: N/A;  . TONSILLECTOMY  1947    SOCIAL HISTORY: Social History   Socioeconomic History  . Marital status: Married    Spouse name: Not on file  . Number of children: Not on file  . Years of education: Not on file  . Highest education level: Not on file  Occupational History  . Occupation: Retired  Scientific laboratory technician  . Financial resource strain: Not on file  . Food insecurity:    Worry: Not on file    Inability: Not on file  . Transportation needs:    Medical: Not on file    Non-medical: Not on file  Tobacco Use  . Smoking status: Never Smoker  . Smokeless tobacco: Never Used  Substance and Sexual Activity  . Alcohol use: Yes    Comment: rare wine  . Drug use: No  . Sexual activity: Not Currently  Lifestyle  . Physical activity:    Days per week: Not on file    Minutes per session: Not on file  . Stress: Not on file  Relationships  . Social connections:    Talks on phone: Not on file      Gets together: Not on file    Attends religious service: Not on file    Active member of club or organization: Not on file    Attends meetings of clubs or organizations: Not on file    Relationship status: Not on file  . Intimate partner violence:    Fear of current or ex partner: Not on file    Emotionally abused: Not on file    Physically abused: Not on file    Forced sexual activity: Not on file  Other Topics Concern  . Not on file  Social History Narrative   Recently moved with her husband to [place] from [place].   Husband is a retired Pharmacist, community.   No children.   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    FAMILY HISTORY: Family History  Problem Relation Age of Onset  . Diabetes Father   . Heart disease Father   . Lymphoma Father   . Heart disease Mother   . Breast cancer Sister 1  . Colon cancer Neg Hx   . Esophageal cancer Neg Hx   . Rectal cancer Neg Hx   . Stomach cancer Neg Hx   .  Bladder Cancer Neg Hx   . Kidney cancer Neg Hx     ALLERGIES:  is allergic to sulfa antibiotics.  MEDICATIONS:  Current Outpatient Medications  Medication Sig Dispense Refill  . acetaminophen (TYLENOL) 325 MG tablet Take 325 mg by mouth every 6 (six) hours as needed (for pain).    [name] atorvastatin (LIPITOR) 20 MG tablet TAKE 1 TABLET DAILY (Patient taking differently: Take 20 mg by mouth at bedtime. ) 90 tablet 3  . benzonatate (TESSALON) 200 MG capsule Take 1 capsule (200 mg total) by mouth 2 (two) times daily as needed for cough. 20 capsule 0  . buPROPion (WELLBUTRIN XL) 150 MG 24 hr tablet TAKE ONE TABLET BY MOUTH EVERY DAY (Patient taking differently: Take 150 mg by mouth every morning. ) 90 tablet 2  . desonide (DESOWEN) 0.05 % lotion Apply 1 application topically as needed. After showering    . DEXILANT 60 MG capsule TAKE 1 CAPSULE BY MOUTH ONCE DAILY (Patient taking differently: Take 60 mg by mouth every morning. ) 90 capsule 0  . dextromethorphan-guaiFENesin  (MUCINEX DM) 30-600 MG 12hr tablet Take 1 tablet by mouth 2 (two) times daily as needed (congestion/cold).     . diphenhydrAMINE (BENADRYL) 25 MG tablet Take 25 mg by mouth every 8 (eight) hours as needed (allergies).     . famotidine (PEPCID) 20 MG tablet Take 20 mg by mouth 2 (two) times daily.    [name] ketoconazole (NIZORAL) 2 % shampoo Apply 1 application topically once a week.     . lubiprostone (AMITIZA) 24 MCG capsule TAKE ONE CAPSULE TWICE A DAY WITH MEALS (Patient taking differently: Take 24 mcg by mouth 2 (two) times daily. ) 60 capsule 3  . memantine (NAMENDA) 10 MG tablet TAKE 1 TABLET BY MOUTH TWICE DAILY (Patient taking differently: Take 10 mg by mouth 2 (two) times daily. ) 180 tablet 2  . mometasone (ELOCON) 0.1 % cream Apply 1 application topically daily. (Patient taking differently: Apply 1 application topically daily as needed (itching.). ) 45 g 0  . Multiple Vitamin (MULTIVITAMIN WITH MINERALS) TABS tablet Take 1 tablet by mouth daily.    [name] MYRBETRIQ 50 MG TB24 tablet TAKE 1 TABLET BY MOUTH DAILY (Patient taking differently: Take 50 mg by mouth every morning. ) 90 tablet 1  . polyethylene glycol (MIRALAX / GLYCOLAX) packet Take 17 g by mouth daily.    . predniSONE (DELTASONE) 20 MG tablet Take 1 tablet (20 mg total) by mouth daily. 7 tablet 0  . promethazine-dextromethorphan (PROMETHAZINE-DM) 6.25-15 MG/5ML syrup Take 5 mLs by mouth 4 (four) times daily as needed. 118 mL 0  . solifenacin (VESICARE) 5 MG tablet Take 1 tablet (5 mg total) by mouth daily. 30 tablet 3  . TOVIAZ 4 MG TB24 tablet TAKE 1 TABLET BY MOUTH DAILY (Patient taking differently: Take 4 mg by mouth at bedtime. ) 30 each 5  . triamcinolone (NASACORT) 55 MCG/ACT AERO nasal inhaler Place 1 spray into the nose as needed.     . venlafaxine XR (EFFEXOR-XR) 150 MG 24 hr capsule TAKE 1 CAPSULE BY MOUTH DAILY (Patient taking differently: Take 150 mg by mouth daily with breakfast. ) 90 capsule 1   No current  facility-administered medications for this visit.      PHYSICAL EXAMINATION: ECOG PERFORMANCE STATUS: 1 - Symptomatic but completely ambulatory Vitals:   07/21/18 0856  BP: (!) 96/58  Pulse: 75  Temp: (!) 96.1 F (35.6 C)  SpO2: 97%   Filed Weights  07/21/18 0856  Weight: 148 lb 8 oz (67.4 kg)    Physical Exam  Constitutional: She is oriented to person, place, and time. No distress.  HENT:  Head: Normocephalic and atraumatic.  Nose: Nose normal.  Mouth/Throat: Oropharynx is clear and moist. No oropharyngeal exudate.  Eyes: Pupils are equal, round, and reactive to light. EOM are normal. No scleral icterus.  Neck: Normal range of motion. Neck supple.  Cardiovascular: Normal rate and regular rhythm.  No murmur heard. Pulmonary/Chest: Effort normal. No respiratory distress. She has no rales. She exhibits no tenderness.  Abdominal: Soft. She exhibits no distension. There is no abdominal tenderness.  Musculoskeletal: Normal range of motion.        General: No edema.  Neurological: She is alert and oriented to person, place, and time. No cranial nerve deficit. She exhibits normal muscle tone. Coordination normal.  Skin: Skin is warm and dry. She is not diaphoretic. No erythema.  Psychiatric: Affect normal.     CMP Latest Ref Rng & Units 06/16/2018  Glucose 70 - 99 mg/dL 122(H)  BUN 8 - 23 mg/dL 20  Creatinine 0.44 - 1.00 mg/dL 0.93  Sodium 135 - 145 mmol/L 140  Potassium 3.5 - 5.1 mmol/L 3.9  Chloride 98 - 111 mmol/L 105  CO2 22 - 32 mmol/L 28  Calcium 8.9 - 10.3 mg/dL 8.7(L)  Total Protein 6.5 - 8.1 g/dL 7.0  Total Bilirubin 0.3 - 1.2 mg/dL 0.5  Alkaline Phos 38 - 126 U/L 129(H)  AST 15 - 41 U/L 17  ALT 0 - 44 U/L 25   CBC Latest Ref Rng & Units 06/16/2018  WBC 4.0 - 10.5 K/uL 9.7  Hemoglobin 12.0 - 15.0 g/dL 11.7(L)  Hematocrit 36.0 - 46.0 % 36.7  Platelets 150 - 400 K/uL 322    LABORATORY DATA:  I have reviewed the data as listed Lab Results  Component Value  Date   WBC 9.7 06/16/2018   HGB 11.7 (L) 06/16/2018   HCT 36.7 06/16/2018   MCV 96.8 06/16/2018   PLT 322 06/16/2018   Recent Labs    11/11/17 1211 06/16/18 1154  NA 142 140  K 4.5 3.9  CL 103 105  CO2 33* 28  GLUCOSE 97 122*  BUN 32* 20  CREATININE 0.98 0.93  CALCIUM 9.5 8.7*  GFRNONAA  --  58*  GFRAA  --  >60  PROT 6.5 7.0  ALBUMIN 4.2 3.8  AST 20 17  ALT 31 25  ALKPHOS 78 129*  BILITOT 0.6 0.5   Iron/TIBC/Ferritin/ %Sat No results found for: IRON, TIBC, FERRITIN, IRONPCTSAT     RADIOGRAPHIC STUDIES: I have personally reviewed the radiological images as listed and agreed with the findings in the report. 06/21/2018 PET scan.  1. Left subareolar breast lesion with biopsy clip exhibits mild increased uptake with enlarged and hypermetabolic left axillary lymph node. 2. There is a dominant left upper lobe lung mass which exhibits moderate increased uptake and may represent a focus of metastatic disease from breast primary or primary lung neoplasm. Multiple additional small pulmonary nodules are scattered throughout both lungs which are worrisome for metastasis. 3. Hypermetabolic bilateral hilar lymph nodes concerning for metastatic disease. 4.  Aortic Atherosclerosis (ICD10-I70.0).  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   ASSESSMENT & PLAN:  1. Primary adenocarcinoma of lung, unspecified laterality ([place])   2. Malignant neoplasm of left female breast, unspecified estrogen receptor status, unspecified site of breast ([place])   3. Goals of care, counseling/discussion  Patient has 2 primaries.  cT2 N3 M1a stage IV non-small cell lung cancer, and stage IB cT1b,N1 M0  ER+PR+  grade 1 breast cancer. The diagnosis and care plan were discussed with patient in detail.  NCCN guidelines were reviewed and shared with patient.   PET, MRI brain scan image findings was independently reviewed and discussed with patient. NGS for lung cancer have been sent.   If she has about EGFR/ALK/ROS/BRAF mutations will proceed with targeted therapy. If no target mutations,  recommend immunotherapy +/- chemotherapy. Depending on her PD-L1 status.   For the breast cancer treatment, if she will proceed with chemotherapy, recommend putting off surgery will start with anti estrogen therapy. If she gets target therapy or immunotherapy, maybe able to proceed with breast surgery.    The goal of treatment for lung cancer is to palliate disease, disease related symptoms, improve quality of life and hopefully prolong life was highlighted in our discussion.  Chemotherapy education was provided.  We had discussed the composition of chemotherapy regimen, length of chemo cycle, duration of treatment and the time to assess response to treatment.    If she needs chemotherapy/immunotherapy, I explained to the patient the risks and benefits of chemotherapy Carboplatin and Alimta including all but not limited to infusion reactions, hair loss, mouth sore, nausea, vomiting, low blood counts, bleeding, and risk of life threatening infection and even death, secondary malignancy etc.  Patient voices understanding and willing to proceed chemotherapy.   I discussed the mechanism of action and rationale of using immunotherapy Keytruda.  The goal of therapy is palliative; and length of treatments are likely ongoing/based upon the results of the scans. Discussed the potential side effects of immunotherapy including but not limited to diarrhea; skin rash; respiratory failure, neurotoxicity, elevated LFTs/endocrine abnormalities etc.  # Chemotherapy education; Medi- port placement if indicated. Antiemetics-Zofran and Compazine; EMLA cream sent to pharmacy  Supportive care measures are necessary for patient well-being and will be provided as necessary. We spent sufficient time to discuss many aspect of care, questions were answered to patient's satisfaction.  All questions were answered. The  patient knows to call the clinic with any problems questions or concerns.  Return of visit: to be determined.   We spent sufficient time to discuss many aspect of care, questions were answered to patient's satisfaction. Total face to face encounter time for this patient visit was 40 min. >50% of the time was  spent in counseling and coordination of care.   Earlie Server, MD, PhD Hematology Oncology [facility] at [facility] Pager- [phone] 07/21/2018

## 2018-07-21 NOTE — Patient Instructions (Signed)
Pembrolizumab injection  What is this medicine?  PEMBROLIZUMAB (pem broe liz ue mab) is a monoclonal antibody. It is used to treat cervical cancer, esophageal cancer, head and neck cancer, hepatocellular cancer, Hodgkin lymphoma, kidney cancer, lymphoma, melanoma, Merkel cell carcinoma, lung cancer, stomach cancer, urothelial cancer, and cancers that have a certain genetic condition.  This medicine may be used for other purposes; ask your health care provider or pharmacist if you have questions.  COMMON BRAND NAME(S): Keytruda  What should I tell my health care provider before I take this medicine?  They need to know if you have any of these conditions:  -diabetes  -immune system problems  -inflammatory bowel disease  -liver disease  -lung or breathing disease  -lupus  -received or scheduled to receive an organ transplant or a stem-cell transplant that uses donor stem cells  -an unusual or allergic reaction to pembrolizumab, other medicines, foods, dyes, or preservatives  -pregnant or trying to get pregnant  -breast-feeding  How should I use this medicine?  This medicine is for infusion into a vein. It is given by a health care professional in a hospital or clinic setting.  A special MedGuide will be given to you before each treatment. Be sure to read this information carefully each time.  Talk to your pediatrician regarding the use of this medicine in children. While this drug may be prescribed for selected conditions, precautions do apply.  Overdosage: If you think you have taken too much of this medicine contact a poison control center or emergency room at once.  NOTE: This medicine is only for you. Do not share this medicine with others.  What if I miss a dose?  It is important not to miss your dose. Call your doctor or health care professional if you are unable to keep an appointment.  What may interact with this medicine?  Interactions have not been studied.  Give your health care provider a list of all the  medicines, herbs, non-prescription drugs, or dietary supplements you use. Also tell them if you smoke, drink alcohol, or use illegal drugs. Some items may interact with your medicine.  This list may not describe all possible interactions. Give your health care provider a list of all the medicines, herbs, non-prescription drugs, or dietary supplements you use. Also tell them if you smoke, drink alcohol, or use illegal drugs. Some items may interact with your medicine.  What should I watch for while using this medicine?  Your condition will be monitored carefully while you are receiving this medicine.  You may need blood work done while you are taking this medicine.  Do not become pregnant while taking this medicine or for 4 months after stopping it. Women should inform their doctor if they wish to become pregnant or think they might be pregnant. There is a potential for serious side effects to an unborn child. Talk to your health care professional or pharmacist for more information. Do not breast-feed an infant while taking this medicine or for 4 months after the last dose.  What side effects may I notice from receiving this medicine?  Side effects that you should report to your doctor or health care professional as soon as possible:  -allergic reactions like skin rash, itching or hives, swelling of the face, lips, or tongue  -bloody or black, tarry  -breathing problems  -changes in vision  -chest pain  -chills  -confusion  -constipation  -cough  -diarrhea  -dizziness or feeling faint or lightheaded  -  blistering, peeling or loosening of the skin, including inside the mouth -signs and symptoms of high blood sugar  such as dizziness; dry mouth; dry skin; fruity breath; nausea; stomach pain; increased hunger or thirst; increased urination -signs and symptoms of kidney injury like trouble passing urine or change in the amount of urine -signs and symptoms of liver injury like dark urine, light-colored stools, loss of appetite, nausea, right upper belly pain, yellowing of the eyes or skin -sweating -swollen lymph nodes -weight loss Side effects that usually do not require medical attention (report to your doctor or health care professional if they continue or are bothersome): -decreased appetite -muscle pain -tiredness This list may not describe all possible side effects. Call your doctor for medical advice about side effects. You may report side effects to FDA at 1-800-FDA-1088. Where should I keep my medicine? This drug is given in a hospital or clinic and will not be stored at home. NOTE: This sheet is a summary. It may not cover all possible information. If you have questions about this medicine, talk to your doctor, pharmacist, or health care provider.  2019 Elsevier/Gold Standard (2017-12-24 15:06:10) Pemetrexed injection What is this medicine? PEMETREXED (PEM e TREX ed) is a chemotherapy drug used to treat lung cancers like non-small cell lung cancer and mesothelioma. It may also be used to treat other cancers. This medicine may be used for other purposes; ask your health care provider or pharmacist if you have questions. COMMON BRAND NAME(S): Alimta What should I tell my health care provider before I take this medicine? They need to know if you have any of these conditions: -infection (especially a virus infection such as chickenpox, cold sores, or herpes) -kidney disease -low blood counts, like low white cell, platelet, or red cell counts -lung or breathing disease, like asthma -radiation therapy -an unusual or allergic reaction to pemetrexed, other medicines, foods, dyes, or  preservative -pregnant or trying to get pregnant -breast-feeding How should I use this medicine? This drug is given as an infusion into a vein. It is administered in a hospital or clinic by a specially trained health care professional. Talk to your pediatrician regarding the use of this medicine in children. Special care may be needed. Overdosage: If you think you have taken too much of this medicine contact a poison control center or emergency room at once. NOTE: This medicine is only for you. Do not share this medicine with others. What if I miss a dose? It is important not to miss your dose. Call your doctor or health care professional if you are unable to keep an appointment. What may interact with this medicine? This medicine may interact with the following medications: -Ibuprofen This list may not describe all possible interactions. Give your health care provider a list of all the medicines, herbs, non-prescription drugs, or dietary supplements you use. Also tell them if you smoke, drink alcohol, or use illegal drugs. Some items may interact with your medicine. What should I watch for while using this medicine? Visit your doctor for checks on your progress. This drug may make you feel generally unwell. This is not uncommon, as chemotherapy can affect healthy cells as well as cancer cells. Report any side effects. Continue your course of treatment even though you feel ill unless your doctor tells you to stop. In some cases, you may be given additional medicines to help with side effects. Follow all directions for their use. Call your doctor or health care professional for advice if  you get a fever, chills or sore throat, or other symptoms of a cold or flu. Do not treat yourself. This drug decreases your body's ability to fight infections. Try to avoid being around people who are sick. This medicine may increase your risk to bruise or bleed. Call your doctor or health care professional if you  notice any unusual bleeding. Be careful brushing and flossing your teeth or using a toothpick because you may get an infection or bleed more easily. If you have any dental work done, tell your dentist you are receiving this medicine. Avoid taking products that contain aspirin, acetaminophen, ibuprofen, naproxen, or ketoprofen unless instructed by your doctor. These medicines may hide a fever. Call your doctor or health care professional if you get diarrhea or mouth sores. Do not treat yourself. To protect your kidneys, drink water or other fluids as directed while you are taking this medicine. Do not become pregnant while taking this medicine or for 6 months after stopping it. Women should inform their doctor if they wish to become pregnant or think they might be pregnant. Men should not father a child while taking this medicine and for 3 months after stopping it. This may interfere with the ability to father a child. You should talk to your doctor or health care professional if you are concerned about your fertility. There is a potential for serious side effects to an unborn child. Talk to your health care professional or pharmacist for more information. Do not breast-feed an infant while taking this medicine or for 1 week after stopping it. What side effects may I notice from receiving this medicine? Side effects that you should report to your doctor or health care professional as soon as possible: -allergic reactions like skin rash, itching or hives, swelling of the face, lips, or tongue -breathing problems -redness, blistering, peeling or loosening of the skin, including inside the mouth -signs and symptoms of bleeding such as bloody or black, tarry stools; red or dark-brown urine; spitting up blood or brown material that looks like coffee grounds; red spots on the skin; unusual bruising or bleeding from the eye, gums, or nose -signs and symptoms of infection like fever or chills; cough; sore throat;  pain or trouble passing urine -signs and symptoms of kidney injury like trouble passing urine or change in the amount of urine -signs and symptoms of liver injury like dark yellow or brown urine; general ill feeling or flu-like symptoms; light-colored stools; loss of appetite; nausea; right upper belly pain; unusually weak or tired; yellowing of the eyes or skin Side effects that usually do not require medical attention (report to your doctor or health care professional if they continue or are bothersome): -constipation -mouth sores -nausea, vomiting -unusually weak or tired This list may not describe all possible side effects. Call your doctor for medical advice about side effects. You may report side effects to FDA at 1-800-FDA-1088. Where should I keep my medicine? This drug is given in a hospital or clinic and will not be stored at home. NOTE: This sheet is a summary. It may not cover all possible information. If you have questions about this medicine, talk to your doctor, pharmacist, or health care provider.  2019 Elsevier/Gold Standard (2017-07-01 16:11:33) Carboplatin injection What is this medicine? CARBOPLATIN (KAR boe pla tin) is a chemotherapy drug. It targets fast dividing cells, like cancer cells, and causes these cells to die. This medicine is used to treat ovarian cancer and many other cancers. This medicine  may be used for other purposes; ask your health care provider or pharmacist if you have questions. COMMON BRAND NAME(S): Paraplatin What should I tell my health care provider before I take this medicine? They need to know if you have any of these conditions: -blood disorders -hearing problems -kidney disease -recent or ongoing radiation therapy -an unusual or allergic reaction to carboplatin, cisplatin, other chemotherapy, other medicines, foods, dyes, or preservatives -pregnant or trying to get pregnant -breast-feeding How should I use this medicine? This drug is  usually given as an infusion into a vein. It is administered in a hospital or clinic by a specially trained health care professional. Talk to your pediatrician regarding the use of this medicine in children. Special care may be needed. Overdosage: If you think you have taken too much of this medicine contact a poison control center or emergency room at once. NOTE: This medicine is only for you. Do not share this medicine with others. What if I miss a dose? It is important not to miss a dose. Call your doctor or health care professional if you are unable to keep an appointment. What may interact with this medicine? -medicines for seizures -medicines to increase blood counts like filgrastim, pegfilgrastim, sargramostim -some antibiotics like amikacin, gentamicin, neomycin, streptomycin, tobramycin -vaccines Talk to your doctor or health care professional before taking any of these medicines: -acetaminophen -aspirin -ibuprofen -ketoprofen -naproxen This list may not describe all possible interactions. Give your health care provider a list of all the medicines, herbs, non-prescription drugs, or dietary supplements you use. Also tell them if you smoke, drink alcohol, or use illegal drugs. Some items may interact with your medicine. What should I watch for while using this medicine? Your condition will be monitored carefully while you are receiving this medicine. You will need important blood work done while you are taking this medicine. This drug may make you feel generally unwell. This is not uncommon, as chemotherapy can affect healthy cells as well as cancer cells. Report any side effects. Continue your course of treatment even though you feel ill unless your doctor tells you to stop. In some cases, you may be given additional medicines to help with side effects. Follow all directions for their use. Call your doctor or health care professional for advice if you get a fever, chills or sore throat,  or other symptoms of a cold or flu. Do not treat yourself. This drug decreases your body's ability to fight infections. Try to avoid being around people who are sick. This medicine may increase your risk to bruise or bleed. Call your doctor or health care professional if you notice any unusual bleeding. Be careful brushing and flossing your teeth or using a toothpick because you may get an infection or bleed more easily. If you have any dental work done, tell your dentist you are receiving this medicine. Avoid taking products that contain aspirin, acetaminophen, ibuprofen, naproxen, or ketoprofen unless instructed by your doctor. These medicines may hide a fever. Do not become pregnant while taking this medicine. Women should inform their doctor if they wish to become pregnant or think they might be pregnant. There is a potential for serious side effects to an unborn child. Talk to your health care professional or pharmacist for more information. Do not breast-feed an infant while taking this medicine. What side effects may I notice from receiving this medicine? Side effects that you should report to your doctor or health care professional as soon as possible: -allergic reactions  like skin rash, itching or hives, swelling of the face, lips, or tongue -signs of infection - fever or chills, cough, sore throat, pain or difficulty passing urine -signs of decreased platelets or bleeding - bruising, pinpoint red spots on the skin, black, tarry stools, nosebleeds -signs of decreased red blood cells - unusually weak or tired, fainting spells, lightheadedness -breathing problems -changes in hearing -changes in vision -chest pain -high blood pressure -low blood counts - This drug may decrease the number of white blood cells, red blood cells and platelets. You may be at increased risk for infections and bleeding. -nausea and vomiting -pain, swelling, redness or irritation at the injection site -pain,  tingling, numbness in the hands or feet -problems with balance, talking, walking -trouble passing urine or change in the amount of urine Side effects that usually do not require medical attention (report to your doctor or health care professional if they continue or are bothersome): -hair loss -loss of appetite -metallic taste in the mouth or changes in taste This list may not describe all possible side effects. Call your doctor for medical advice about side effects. You may report side effects to FDA at 1-800-FDA-1088. Where should I keep my medicine? This drug is given in a hospital or clinic and will not be stored at home. NOTE: This sheet is a summary. It may not cover all possible information. If you have questions about this medicine, talk to your doctor, pharmacist, or health care provider.  2019 Elsevier/Gold Standard (2007-08-17 14:38:05)

## 2018-07-21 NOTE — Progress Notes (Signed)
  Oncology Nurse Navigator Documentation  Navigator Location: [facility] (07/21/18 1100)   )Navigator Encounter Type: Follow-up Appt (07/21/18 1100)   Abnormal Finding Date: 06/21/18 (07/21/18 1100) Confirmed Diagnosis Date: 07/14/18 (07/21/18 1100)               Patient Visit Type: MedOnc (07/21/18 1100) Treatment Phase: Pre-Tx/Tx Discussion (07/21/18 1100) Barriers/Navigation Needs: Education;Coordination of Care (07/21/18 1100) Education: Understanding Cancer/ Treatment Options;Newly Diagnosed Cancer Education (07/21/18 1100) Interventions: Coordination of Care;Education (07/21/18 1100)   Coordination of Care: Appts;Chemo (07/21/18 1100) Education Method: Verbal;Written (07/21/18 1100)      Acuity: Level 2 (07/21/18 1100)   Acuity Level 2: Initial guidance, education and coordination as needed;Educational needs;Assistance expediting appointments (07/21/18 1100)  met with patient and her husband during follow up visit with Dr. Tasia Catchings to discuss final pathology results and treatment options. All questions answered during visit. Pt given resources regarding diagnosis and supportive services available. Reviewed upcoming appts. Contact info given and instructed to call with any further questions or needs. Pt and husband verbalized understanding.   Time Spent with Patient: 60 (07/21/18 1100)

## 2018-07-22 ENCOUNTER — Other Ambulatory Visit: Payer: Self-pay

## 2018-07-22 NOTE — Progress Notes (Signed)
Tumor Board Documentation  [name] was presented by Dr Tasia Catchings at our Tumor Board on 07/22/2018, which included representatives from medical oncology, radiation oncology, surgical oncology, surgical, radiology, pathology, navigation, internal medicine, pharmacy, genetics, research, palliative care, pulmonology.  Jamie Matthews currently presents as a current patient, for Brent, for discussion, for new positive pathology with history of the following treatments: surgical intervention(s).  Additionally, we reviewed previous medical and familial history, history of present illness, and recent lab results along with all available histopathologic and imaging studies. The tumor board considered available treatment options and made the following recommendations: Chemotherapy, Immunotherapy, Surgery As patient has newly diagnosed 2 primary cancers, will treat Lung cancer first, then breast surgery at later date  The following procedures/referrals were also placed: No orders of the defined types were placed in this encounter.   Clinical Trial Status: not discussed   Staging used: AJCC Stage Group AJCC Staging: T: Lung cT2       Breast cT1 N: Lung cN1     Breast N1 M: Lung            Breast M0 Group: Lung  Stage 4   Breast Stage 1B National site-specific guidelines NCCN were discussed with respect to the case.  Tumor board is a meeting of clinicians from various specialty areas who evaluate and discuss patients for whom a multidisciplinary approach is being considered. Final determinations in the plan of care are those of the provider(s). The responsibility for follow up of recommendations given during tumor board is that of the provider.   Today's extended care, comprehensive team conference, Shivon was not present for the discussion and was not examined.   Multidisciplinary Tumor Board is a multidisciplinary case peer review process.  Decisions discussed in the Multidisciplinary Tumor  Board reflect the opinions of the specialists present at the conference without having examined the patient.  Ultimately, treatment and diagnostic decisions rest with the primary provider(s) and the patient.

## 2018-07-23 ENCOUNTER — Ambulatory Visit: Payer: Self-pay | Admitting: Oncology

## 2018-07-23 ENCOUNTER — Inpatient Hospital Stay: Payer: Medicare Other

## 2018-07-27 ENCOUNTER — Telehealth: Payer: Self-pay | Admitting: *Deleted

## 2018-07-27 ENCOUNTER — Ambulatory Visit: Payer: Self-pay | Admitting: Family Medicine

## 2018-07-27 ENCOUNTER — Other Ambulatory Visit: Payer: Self-pay | Admitting: Oncology

## 2018-07-27 MED ORDER — OXYCODONE HCL 5 MG PO TABS: 5.0000 mg | ORAL_TABLET | Freq: Four times a day (QID) | ORAL | 0 refills | Status: DC | PRN

## 2018-07-27 NOTE — Telephone Encounter (Signed)
Pt started experiencing left shoulder pain last night that was not relieved with Tylenol. Pt unable to get any sleep at night due to the pain. Spoke with Dr. Tasia Catchings who will send in prescription for oxycodone. Pt made aware and informed to call back if pain not any better or getting worse. Pt verbalized understanding.

## 2018-07-28 ENCOUNTER — Encounter: Payer: Self-pay | Admitting: Family Medicine

## 2018-07-28 ENCOUNTER — Ambulatory Visit (INDEPENDENT_AMBULATORY_CARE_PROVIDER_SITE_OTHER): Payer: Medicare Other | Admitting: Family Medicine

## 2018-07-28 VITALS — BP 122/70 | Ht 63.0 in | Wt 147.0 lb

## 2018-07-28 DIAGNOSIS — M7918 Myalgia, other site: Secondary | ICD-10-CM | POA: Diagnosis not present

## 2018-07-28 NOTE — Assessment & Plan Note (Signed)
Pain seems to be related to trigger points in the trapezius. Unsure if this is related to her cancer. Shoulder has good strength and ROm.  - trigger point injections  - counseled on HEP and supportive care - if no improvement would consider neck xray or PT

## 2018-07-28 NOTE — Patient Instructions (Signed)
Nice to meet you  Please try heat on the area  Please try Aspercreme with lidocaine or salonpas. Please try the exercises and working on your posture. Please see me back in 2 to 3 weeks if no better

## 2018-07-28 NOTE — Progress Notes (Signed)
Jamie Matthews - 78 y.o. female MRN [id number]  Date of birth: Jun 07, 1940  SUBJECTIVE:  Including CC & ROS.  Chief Complaint  Patient presents with  . Shoulder Pain    left x2 days    Jamie Matthews is a 78 y.o. female that is  Presenting with left sided trapezius pain. No inciting event. Pain is constant and severe. Occurring at the superior border of the scapula. No radiation. Pain is sharp and stabbing. No similar pain. Any movement makes pain worse. No improvement with oxy. Unsure is she sleep different.   Has been diagnosed with lung cancer and history of breast cancer.   Review of Systems  Constitutional: Negative for fever.  HENT: Negative for congestion.   Respiratory: Negative for cough.   Cardiovascular: Negative for chest pain.  Gastrointestinal: Negative for abdominal pain.  Musculoskeletal: Positive for arthralgias and neck pain.  Skin: Negative for color change.  Neurological: Negative for weakness.  Hematological: Negative for adenopathy.  Psychiatric/Behavioral: Negative for agitation.    HISTORY: Past Medical, Surgical, Social, and Family History Reviewed & Updated per EMR.   Pertinent Historical Findings include:  Past Medical History:  Diagnosis Date  . Anxiety   . Arthritis   . Belching   . Bladder disorder    OVERACTIVE  . Bowel dysfunction    BLOCKAGE  . Cancer ([place])   . Constipation   . Depression   . Diverticulitis   . Fibromyalgia   . GERD (gastroesophageal reflux disease)   . Hyperlipidemia   . IBS (irritable bowel syndrome)   . Internal hemorrhoids   . Memory deficits   . Murmur    asymptomatic  . Pneumonia 11/18/12  . Urinary incontinence   . Vertigo     Past Surgical History:  Procedure Laterality Date  . AXILLARY LYMPH NODE BIOPSY Left 06/08/2018   INVASIVE MAMMARY CARCINOMA  . BLADDER SUSPENSION  2004, 2012  . BREAST BIOPSY Left 06/08/2018   INVASIVE MAMMARY CARCINOMA  . CATARACT EXTRACTION W/PHACO  Right 08/27/2015   Procedure: CATARACT EXTRACTION PHACO AND INTRAOCULAR LENS PLACEMENT (IOC);  Surgeon: Estill Cotta, MD;  Location: [facility] ORS;  Service: Ophthalmology;  Laterality: Right;  [place]   1:00.2 AP%  22.5 CDE  23.67 fluid casette lot #[id number]  exp05/31/2018  . CATARACT EXTRACTION W/PHACO Left 10/15/2015   Procedure: CATARACT EXTRACTION PHACO AND INTRAOCULAR LENS PLACEMENT (IOC);  Surgeon: Estill Cotta, MD;  Location: [facility] ORS;  Service: Ophthalmology;  Laterality: Left;  [place] 01:07 AP% 18.1 CDE 21.57 fluid pack lot # [id number]  . CHOLECYSTECTOMY    . COLONOSCOPY  2017  . ELECTROMAGNETIC NAVIGATION BROCHOSCOPY N/A 07/09/2018   Procedure: ELECTROMAGNETIC NAVIGATION BRONCHOSCOPY;  Surgeon: Flora Lipps, MD;  Location: [facility] ORS;  Service: Cardiopulmonary;  Laterality: N/A;  . TONSILLECTOMY  1947    Allergies  Allergen Reactions  . Sulfa Antibiotics Rash    Family History  Problem Relation Age of Onset  . Diabetes Father   . Heart disease Father   . Lymphoma Father   . Heart disease Mother   . Breast cancer Sister 108  . Colon cancer Neg Hx   . Esophageal cancer Neg Hx   . Rectal cancer Neg Hx   . Stomach cancer Neg Hx   . Bladder Cancer Neg Hx   . Kidney cancer Neg Hx      Social History   Socioeconomic History  . Marital status: Married    Spouse name: Not on file  . Number of  children: Not on file  . Years of education: Not on file  . Highest education level: Not on file  Occupational History  . Occupation: Retired  Scientific laboratory technician  . Financial resource strain: Not on file  . Food insecurity:    Worry: Not on file    Inability: Not on file  . Transportation needs:    Medical: Not on file    Non-medical: Not on file  Tobacco Use  . Smoking status: Never Smoker  . Smokeless tobacco: Never Used  Substance and Sexual Activity  . Alcohol use: Yes    Comment: rare wine  . Drug use: No  . Sexual activity: Not Currently  Lifestyle  . Physical activity:     Days per week: Not on file    Minutes per session: Not on file  . Stress: Not on file  Relationships  . Social connections:    Talks on phone: Not on file    Gets together: Not on file    Attends religious service: Not on file    Active member of club or organization: Not on file    Attends meetings of clubs or organizations: Not on file    Relationship status: Not on file  . Intimate partner violence:    Fear of current or ex partner: Not on file    Emotionally abused: Not on file    Physically abused: Not on file    Forced sexual activity: Not on file  Other Topics Concern  . Not on file  Social History Narrative   Recently moved with her husband to [place] from [place].   Husband is a retired Pharmacist, community.   No children.   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     PHYSICAL EXAM:  VS: BP 122/70   Ht 5\' 3"  (1.6 m)   Wt 147 lb (66.7 kg)   BMI 26.04 kg/m  Physical Exam Gen: NAD, alert, cooperative with exam, well-appearing ENT: normal lips, normal nasal mucosa,  Eye: normal EOM, normal conjunctiva and lids CV:  no edema, +2 pedal pulses   Resp: no accessory muscle use, non-labored,  Skin: no rashes, no areas of induration  Neuro: normal tone, normal sensation to touch Psych:  normal insight, alert and oriented MSK:  Neck/left shoulder:  TTP along the left paraspinal cervical muscle. TTP of the midbelly of the trapezius  Normal shoulder ROM  Normal empty can testing  No scapular winging.  Neurovascularly intact    Aspiration/Injection Procedure Note Jamie Matthews 06/19/1940  Procedure: Injection Indications: left trigger points/trapezius pain  Procedure Details Consent: Risks of procedure as well as the alternatives and risks of each were explained to the (patient/caregiver).  Consent for procedure obtained. Time Out: Verified patient identification, verified procedure, site/side was marked, verified correct patient position, special  equipment/implants available, medications/allergies/relevent history reviewed, required imaging and test results available.  Performed.  The area was cleaned with iodine and alcohol swabs.    The left trapezius trigger points was injected using 1 cc's of 40 mg Depo-medrol and 4 cc's of 0.25% bupivacaine with a 25 1" needle.      A sterile dressing was applied.  Patient did tolerate procedure well.        ASSESSMENT & PLAN:   Myofascial pain Pain seems to be related to trigger points in the trapezius. Unsure if this is related to her cancer. Shoulder has good strength and ROm.  - trigger  point injections  - counseled on HEP and supportive care - if no improvement would consider neck xray or PT

## 2018-08-02 ENCOUNTER — Telehealth: Payer: Self-pay | Admitting: Family Medicine

## 2018-08-02 NOTE — Telephone Encounter (Signed)
Copied from [place] [phone]. Topic: Quick Communication - See Telephone Encounter >> Aug 02, 2018  6:04 PM Blase Mess A wrote: CRM for notification. See Telephone encounter for: 08/02/18.   Patient is calling to tell Dr. Deborra Medina - she is going to have to change Dr's Never been able to get an appt with Dr. Deborra Medina, distance, and going to have to start taking chemo. And can't seem to get an appt now. Please advise [phone] or [phone]

## 2018-08-03 ENCOUNTER — Encounter: Payer: Self-pay | Admitting: Oncology

## 2018-08-03 NOTE — Telephone Encounter (Signed)
FYI

## 2018-08-03 NOTE — Telephone Encounter (Signed)
This makes me so sad to hear.  I don't know why she is having difficulty getting appointments but if there is anything I can do to change that,  Please let me know.  I still have openings tomorrow and Thursday. If she prefers to change practices, we can help with that too.

## 2018-08-04 ENCOUNTER — Other Ambulatory Visit: Payer: Self-pay | Admitting: Oncology

## 2018-08-04 ENCOUNTER — Encounter: Payer: Self-pay | Admitting: Oncology

## 2018-08-05 ENCOUNTER — Other Ambulatory Visit: Payer: Self-pay | Admitting: *Deleted

## 2018-08-05 ENCOUNTER — Other Ambulatory Visit: Payer: Self-pay | Admitting: Family Medicine

## 2018-08-05 DIAGNOSIS — C50912 Malignant neoplasm of unspecified site of left female breast: Secondary | ICD-10-CM

## 2018-08-05 MED ORDER — FLUTICASONE PROPIONATE 50 MCG/ACT NA SUSP: 2.0000 | Freq: Every day | NASAL | 6 refills | Status: DC

## 2018-08-06 ENCOUNTER — Telehealth: Payer: Self-pay | Admitting: Pharmacist

## 2018-08-06 ENCOUNTER — Telehealth: Payer: Self-pay | Admitting: Pharmacy Technician

## 2018-08-06 DIAGNOSIS — C3490 Malignant neoplasm of unspecified part of unspecified bronchus or lung: Secondary | ICD-10-CM

## 2018-08-06 NOTE — Telephone Encounter (Signed)
Oral Oncology Pharmacist Encounter  Received new prescription for Xalkori (crizotinib) for the treatment of stage IV non-small cell lung cancer, MET mutation positive, planned duration until disease progression or unacceptable drug toxicity.  CBC/CMP from 06/16/2018 assessed, no relevant lab abnormalities. Patient scheduled for repeat labs on 08/09/2018 prior the start of crizotinib. Prescription dose and frequency assessed.   Current medication list in Epic reviewed, several DDIs with crizotinib identified: - Crizotinib may incrase the concentrations of atorvastatin, oxycodone, solifenacin, and fesoterodine. Patient should be monitored for increased adverse of these medications dueto the higher medication concentrations.   Oral Oncology Clinic will continue to follow for insurance authorization, copayment issues, initial counseling and start date.  Darl Pikes, PharmD, BCPS, [facility] Hematology/Oncology Clinical Pharmacist [facility] [place] [facility] [phone]  08/06/2018 9:49 AM

## 2018-08-06 NOTE — Telephone Encounter (Signed)
Oral Oncology Patient Advocate Encounter  Received notification from [facility] that prior authorization for Jamie Matthews is required.  PA submitted on CoverMyMeds Key [id number] Status is pending  Oral Oncology Clinic will continue to follow.  [place] [facility] [place] Phone [phone] Fax [phone] 08/06/2018 11:21 AM

## 2018-08-09 ENCOUNTER — Other Ambulatory Visit: Payer: Self-pay

## 2018-08-09 ENCOUNTER — Inpatient Hospital Stay: Payer: Medicare Other | Attending: Oncology

## 2018-08-09 ENCOUNTER — Encounter: Payer: Self-pay | Admitting: Oncology

## 2018-08-09 ENCOUNTER — Inpatient Hospital Stay (HOSPITAL_BASED_OUTPATIENT_CLINIC_OR_DEPARTMENT_OTHER): Payer: Medicare Other | Admitting: Oncology

## 2018-08-09 ENCOUNTER — Ambulatory Visit: Payer: Self-pay

## 2018-08-09 VITALS — BP 108/72 | HR 69 | Temp 96.4°F | Resp 18 | Wt 148.8 lb

## 2018-08-09 DIAGNOSIS — R7989 Other specified abnormal findings of blood chemistry: Secondary | ICD-10-CM | POA: Diagnosis not present

## 2018-08-09 DIAGNOSIS — Z170 Estrogen receptor positive status [ER+]: Secondary | ICD-10-CM | POA: Diagnosis not present

## 2018-08-09 DIAGNOSIS — C50912 Malignant neoplasm of unspecified site of left female breast: Secondary | ICD-10-CM | POA: Insufficient documentation

## 2018-08-09 DIAGNOSIS — C773 Secondary and unspecified malignant neoplasm of axilla and upper limb lymph nodes: Secondary | ICD-10-CM | POA: Diagnosis not present

## 2018-08-09 DIAGNOSIS — F419 Anxiety disorder, unspecified: Secondary | ICD-10-CM | POA: Diagnosis not present

## 2018-08-09 DIAGNOSIS — R5383 Other fatigue: Secondary | ICD-10-CM | POA: Insufficient documentation

## 2018-08-09 DIAGNOSIS — Z8249 Family history of ischemic heart disease and other diseases of the circulatory system: Secondary | ICD-10-CM | POA: Insufficient documentation

## 2018-08-09 DIAGNOSIS — R740 Nonspecific elevation of levels of transaminase and lactic acid dehydrogenase [LDH]: Secondary | ICD-10-CM | POA: Insufficient documentation

## 2018-08-09 DIAGNOSIS — Z803 Family history of malignant neoplasm of breast: Secondary | ICD-10-CM | POA: Diagnosis not present

## 2018-08-09 DIAGNOSIS — Z79811 Long term (current) use of aromatase inhibitors: Secondary | ICD-10-CM | POA: Diagnosis not present

## 2018-08-09 DIAGNOSIS — E785 Hyperlipidemia, unspecified: Secondary | ICD-10-CM | POA: Insufficient documentation

## 2018-08-09 DIAGNOSIS — Z79899 Other long term (current) drug therapy: Secondary | ICD-10-CM | POA: Insufficient documentation

## 2018-08-09 DIAGNOSIS — R1031 Right lower quadrant pain: Secondary | ICD-10-CM | POA: Diagnosis not present

## 2018-08-09 DIAGNOSIS — R262 Difficulty in walking, not elsewhere classified: Secondary | ICD-10-CM | POA: Insufficient documentation

## 2018-08-09 DIAGNOSIS — K219 Gastro-esophageal reflux disease without esophagitis: Secondary | ICD-10-CM

## 2018-08-09 DIAGNOSIS — C3412 Malignant neoplasm of upper lobe, left bronchus or lung: Secondary | ICD-10-CM | POA: Insufficient documentation

## 2018-08-09 DIAGNOSIS — C3490 Malignant neoplasm of unspecified part of unspecified bronchus or lung: Secondary | ICD-10-CM

## 2018-08-09 DIAGNOSIS — Z66 Do not resuscitate: Secondary | ICD-10-CM | POA: Diagnosis not present

## 2018-08-09 DIAGNOSIS — M25551 Pain in right hip: Secondary | ICD-10-CM | POA: Insufficient documentation

## 2018-08-09 DIAGNOSIS — R05 Cough: Secondary | ICD-10-CM | POA: Diagnosis not present

## 2018-08-09 DIAGNOSIS — Z7189 Other specified counseling: Secondary | ICD-10-CM

## 2018-08-09 LAB — CBC WITH DIFFERENTIAL/PLATELET
Abs Immature Granulocytes: 0.04 10*3/uL (ref 0.00–0.07)
Basophils Absolute: 0 10*3/uL (ref 0.0–0.1)
Basophils Relative: 1 %
Eosinophils Absolute: 0.7 10*3/uL — ABNORMAL HIGH (ref 0.0–0.5)
Eosinophils Relative: 9 %
HCT: 36.4 % (ref 36.0–46.0)
Hemoglobin: 12 g/dL (ref 12.0–15.0)
Immature Granulocytes: 1 %
LYMPHS PCT: 24 %
Lymphs Abs: 1.8 10*3/uL (ref 0.7–4.0)
MCH: 32 pg (ref 26.0–34.0)
MCHC: 33 g/dL (ref 30.0–36.0)
MCV: 97.1 fL (ref 80.0–100.0)
Monocytes Absolute: 0.8 10*3/uL (ref 0.1–1.0)
Monocytes Relative: 10 %
Neutro Abs: 4.2 10*3/uL (ref 1.7–7.7)
Neutrophils Relative %: 55 %
PLATELETS: 266 10*3/uL (ref 150–400)
RBC: 3.75 MIL/uL — ABNORMAL LOW (ref 3.87–5.11)
RDW: 13 % (ref 11.5–15.5)
WBC: 7.5 10*3/uL (ref 4.0–10.5)
nRBC: 0 % (ref 0.0–0.2)

## 2018-08-09 LAB — COMPREHENSIVE METABOLIC PANEL
ALT: 20 U/L (ref 0–44)
ANION GAP: 3 — AB (ref 5–15)
AST: 16 U/L (ref 15–41)
Albumin: 4 g/dL (ref 3.5–5.0)
Alkaline Phosphatase: 91 U/L (ref 38–126)
BUN: 25 mg/dL — ABNORMAL HIGH (ref 8–23)
CO2: 27 mmol/L (ref 22–32)
Calcium: 8.5 mg/dL — ABNORMAL LOW (ref 8.9–10.3)
Chloride: 106 mmol/L (ref 98–111)
Creatinine, Ser: 1.14 mg/dL — ABNORMAL HIGH (ref 0.44–1.00)
GFR calc Af Amer: 53 mL/min — ABNORMAL LOW (ref >60)
GFR calc non Af Amer: 46 mL/min — ABNORMAL LOW (ref >60)
Glucose, Bld: 109 mg/dL — ABNORMAL HIGH (ref 70–99)
Potassium: 4 mmol/L (ref 3.5–5.1)
Sodium: 136 mmol/L (ref 135–145)
Total Bilirubin: 0.6 mg/dL (ref 0.3–1.2)
Total Protein: 6.8 g/dL (ref 6.5–8.1)

## 2018-08-09 MED ORDER — ANASTROZOLE 1 MG PO TABS: 1.0000 mg | ORAL_TABLET | Freq: Every day | ORAL | 3 refills | Status: DC

## 2018-08-09 MED ORDER — CRIZOTINIB 250 MG PO CAPS: 250.0000 mg | ORAL_CAPSULE | Freq: Two times a day (BID) | ORAL | 0 refills | Status: DC

## 2018-08-09 MED ORDER — ONDANSETRON HCL 8 MG PO TABS: 8.0000 mg | ORAL_TABLET | Freq: Three times a day (TID) | ORAL | 2 refills | Status: DC | PRN

## 2018-08-09 NOTE — Telephone Encounter (Signed)
Oral Chemotherapy Pharmacist Encounter  Patient Education I spoke with patient for overview of new oral chemotherapy medication: Xalkori (crizotinib) for the treatment of stage IV non-small cell lung cancer, MET mutation positive, planned duration until disease progression or unacceptable drug toxicity.   Counseled patient on administration, dosing, side effects, monitoring, drug-food interactions, safe handling, storage, and disposal. Patient will take 1 capsule (250 mg total) by mouth 2 (two) times daily.  She will start the [facility] when the medication is delivered on Wednesday 08/11/2018.  Side effects include but not limited to: changes in kidney/liver function, decreased wbc, diarrhea or constipation, N/V .    Reviewed with patient importance of keeping a medication schedule and plan for any missed doses.  Ms. [name] voiced understanding and appreciation. All questions answered. Medication handout placed in the mail.  Provided patient with Oral [facility] phone number. Patient knows to call the office with questions or concerns. Oral Chemotherapy Navigation Clinic will continue to follow.  Darl Pikes, PharmD, BCPS, [facility] Hematology/Oncology Clinical Pharmacist [facility] [place] [facility] [phone]  08/09/2018 1:43 PM

## 2018-08-09 NOTE — Progress Notes (Signed)
Hematology/Oncology follow up note [facility] Telephone:(336) [phone] Fax:(336) [phone]   Patient Care Team: Lucille Passy, MD as PCP - General (Family Medicine) Harolyn Rutherford Sallyanne Havers, MD as Consulting Physician (Obstetrics and Gynecology) Hilarie Fredrickson, Lajuan Lines, MD as Consulting Physician (Gastroenterology) Telford Nab, RN as Registered Nurse   CHIEF COMPLAINTS/REASON FOR VISIT:  Evaluation of breast cancer  HISTORY OF PRESENTING ILLNESS:  Jamie Matthews is a  78 y.o.  female with PMH listed below who was referred to me for evaluation of breast cancer 05/05/2018 bilateral diagnostic breast mammogram showed suspicious mass 1.1cm  in the 12:00 retroareolar region of the left breast and the left axillary lymph node. 06/08/2018 patient status post a left breast retroareolar and left axillary lymph node biopsy. Pathology showed invasive mammary carcinoma, no special type, grade 1, left axillary lymph node positive for invasive mammary carcinoma clinically metastatic.  Background lymph node architecture is not identified. ER> 90% PR> 90%, HER-2 negative.  Patient present to establish care for newly diagnosed breast cancer. She has an appointment to see Dr. Dahlia Byes later today.  Denies history of hormone replacement therapy. Family history positive for sister who was diagnosed with breast cancer at age of 3. Recent episode of runny nose, nasal congestion, ear fullness and throat clearing cough. Was diagnosed with viral URI.   #  PET scan done which unfortunately showed additional hypermetabolic bilateral hilar lymph nodes as well as left upper lobe lung mass which may represent a focus of metastatic disease from breast, or primary lung neoplasm.  There is multiple additional small pulmonary nodules are scattered throughout both lungs which are worrisome for metastasis.  Index nodule within the medial right upper lobe measures 7 mm,, peri-broncho-vascular nodule in the  right lower lobe measures 1.2 cm.  # Biopsy of lung mass left upper lobe showed non-small cell lung cancer, favor adenocarcinoma.  INTERVAL HISTORY Jamie Matthews is a 78 y.o. female who has above history reviewed by me today presents for follow up visit for discussion of management plan for breast cancer and lung cancer.  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 Patient reports feeling chronically tired and fatigued.  Fatigue onset is since last fall.  No exacerbating or alleviating factors Denies cough, hemoptysis, chest pain, shortness of breath, unintentional weight loss.   Review of Systems  Constitutional: Positive for fatigue. Negative for appetite change, chills and fever.  HENT:   Negative for hearing loss and voice change.   Eyes: Negative for eye problems.  Respiratory: Negative for chest tightness and cough.   Cardiovascular: Negative for chest pain.  Gastrointestinal: Negative for abdominal distention, abdominal pain and blood in stool.  Endocrine: Negative for hot flashes.  Genitourinary: Negative for difficulty urinating and frequency.   Musculoskeletal: Negative for arthralgias.  Skin: Negative for itching and rash.  Neurological: Negative for extremity weakness.  Hematological: Negative for adenopathy.  Psychiatric/Behavioral: Negative for confusion.    MEDICAL HISTORY:  Past Medical History:  Diagnosis Date  . Anxiety   . Arthritis   . Belching   . Bladder disorder    OVERACTIVE  . Bowel dysfunction    BLOCKAGE  . Cancer ([place])   . Constipation   . Depression   . Diverticulitis   . Fibromyalgia   . GERD (gastroesophageal reflux disease)   . Hyperlipidemia   . IBS (irritable bowel syndrome)   . Internal hemorrhoids   . Memory deficits   . Murmur  asymptomatic  . Pneumonia 11/18/12  . Urinary incontinence   . Vertigo     SURGICAL HISTORY: Past Surgical History:  Procedure Laterality Date  . AXILLARY LYMPH  NODE BIOPSY Left 06/08/2018   INVASIVE MAMMARY CARCINOMA  . BLADDER SUSPENSION  2004, 2012  . BREAST BIOPSY Left 06/08/2018   INVASIVE MAMMARY CARCINOMA  . CATARACT EXTRACTION W/PHACO Right 08/27/2015   Procedure: CATARACT EXTRACTION PHACO AND INTRAOCULAR LENS PLACEMENT (IOC);  Surgeon: Estill Cotta, MD;  Location: [facility] ORS;  Service: Ophthalmology;  Laterality: Right;  [place]   1:00.2 AP%  22.5 CDE  23.67 fluid casette lot #[id number]  exp05/31/2018  . CATARACT EXTRACTION W/PHACO Left 10/15/2015   Procedure: CATARACT EXTRACTION PHACO AND INTRAOCULAR LENS PLACEMENT (IOC);  Surgeon: Estill Cotta, MD;  Location: [facility] ORS;  Service: Ophthalmology;  Laterality: Left;  [place] 01:07 AP% 18.1 CDE 21.57 fluid pack lot # [id number]  . CHOLECYSTECTOMY    . COLONOSCOPY  2017  . ELECTROMAGNETIC NAVIGATION BROCHOSCOPY N/A 07/09/2018   Procedure: ELECTROMAGNETIC NAVIGATION BRONCHOSCOPY;  Surgeon: Flora Lipps, MD;  Location: [facility] ORS;  Service: Cardiopulmonary;  Laterality: N/A;  . TONSILLECTOMY  1947    SOCIAL HISTORY: Social History   Socioeconomic History  . Marital status: Married    Spouse name: Not on file  . Number of children: Not on file  . Years of education: Not on file  . Highest education level: Not on file  Occupational History  . Occupation: Retired  Scientific laboratory technician  . Financial resource strain: Not on file  . Food insecurity:    Worry: Not on file    Inability: Not on file  . Transportation needs:    Medical: Not on file    Non-medical: Not on file  Tobacco Use  . Smoking status: Never Smoker  . Smokeless tobacco: Never Used  Substance and Sexual Activity  . Alcohol use: Yes    Comment: rare wine  . Drug use: No  . Sexual activity: Not Currently  Lifestyle  . Physical activity:    Days per week: Not on file    Minutes per session: Not on file  . Stress: Not on file  Relationships  . Social connections:    Talks on phone: Not on file    Gets together: Not on file     Attends religious service: Not on file    Active member of club or organization: Not on file    Attends meetings of clubs or organizations: Not on file    Relationship status: Not on file  . Intimate partner violence:    Fear of current or ex partner: Not on file    Emotionally abused: Not on file    Physically abused: Not on file    Forced sexual activity: Not on file  Other Topics Concern  . Not on file  Social History Narrative   Recently moved with her husband to [place] from [place].   Husband is a retired Pharmacist, community.   No children.   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    FAMILY HISTORY: Family History  Problem Relation Age of Onset  . Diabetes Father   . Heart disease Father   . Lymphoma Father   . Heart disease Mother   . Breast cancer Sister 101  . Colon cancer Neg Hx   . Esophageal cancer Neg Hx   . Rectal cancer Neg Hx   . Stomach cancer Neg Hx   .  Bladder Cancer Neg Hx   . Kidney cancer Neg Hx     ALLERGIES:  is allergic to sulfa antibiotics.  MEDICATIONS:  Current Outpatient Medications  Medication Sig Dispense Refill  . acetaminophen (TYLENOL) 325 MG tablet Take 325 mg by mouth every 6 (six) hours as needed (for pain).    [name] anastrozole (ARIMIDEX) 1 MG tablet Take 1 tablet (1 mg total) by mouth daily. 30 tablet 3  . atorvastatin (LIPITOR) 20 MG tablet TAKE 1 TABLET DAILY (Patient taking differently: Take 20 mg by mouth at bedtime. ) 90 tablet 3  . benzonatate (TESSALON) 200 MG capsule Take 1 capsule (200 mg total) by mouth 2 (two) times daily as needed for cough. 20 capsule 0  . buPROPion (WELLBUTRIN XL) 150 MG 24 hr tablet TAKE ONE TABLET BY MOUTH EVERY DAY (Patient taking differently: Take 150 mg by mouth every morning. ) 90 tablet 2  . desonide (DESOWEN) 0.05 % lotion Apply 1 application topically as needed. After showering    . DEXILANT 60 MG capsule TAKE 1 CAPSULE BY MOUTH ONCE DAILY (Patient taking differently: Take 60 mg by mouth every  morning. ) 90 capsule 0  . dextromethorphan-guaiFENesin (MUCINEX DM) 30-600 MG 12hr tablet Take 1 tablet by mouth 2 (two) times daily as needed (congestion/cold).     . diphenhydrAMINE (BENADRYL) 25 MG tablet Take 25 mg by mouth every 8 (eight) hours as needed (allergies).     . famotidine (PEPCID) 20 MG tablet Take 20 mg by mouth 2 (two) times daily.    . fluticasone (FLONASE) 50 MCG/ACT nasal spray Place 2 sprays into both nostrils daily. 16 g 6  . ketoconazole (NIZORAL) 2 % shampoo Apply 1 application topically once a week.     . lubiprostone (AMITIZA) 24 MCG capsule TAKE ONE CAPSULE TWICE A DAY WITH MEALS (Patient taking differently: Take 24 mcg by mouth 2 (two) times daily. ) 60 capsule 3  . memantine (NAMENDA) 10 MG tablet TAKE 1 TABLET BY MOUTH TWICE DAILY (Patient taking differently: Take 10 mg by mouth 2 (two) times daily. ) 180 tablet 2  . mometasone (ELOCON) 0.1 % cream Apply 1 application topically daily. (Patient taking differently: Apply 1 application topically daily as needed (itching.). ) 45 g 0  . Multiple Vitamin (MULTIVITAMIN WITH MINERALS) TABS tablet Take 1 tablet by mouth daily.    [name] MYRBETRIQ 50 MG TB24 tablet TAKE 1 TABLET BY MOUTH DAILY (Patient taking differently: Take 50 mg by mouth every morning. ) 90 tablet 1  . polyethylene glycol (MIRALAX / GLYCOLAX) packet Take 17 g by mouth daily.    . promethazine-dextromethorphan (PROMETHAZINE-DM) 6.25-15 MG/5ML syrup Take 5 mLs by mouth 4 (four) times daily as needed. 118 mL 0  . solifenacin (VESICARE) 5 MG tablet Take 1 tablet (5 mg total) by mouth daily. 30 tablet 3  . TOVIAZ 4 MG TB24 tablet TAKE 1 TABLET BY MOUTH DAILY (Patient taking differently: Take 4 mg by mouth at bedtime. ) 30 each 5  . triamcinolone (NASACORT) 55 MCG/ACT AERO nasal inhaler Place 1 spray into the nose as needed.     . venlafaxine XR (EFFEXOR-XR) 150 MG 24 hr capsule TAKE 1 CAPSULE BY MOUTH DAILY (Patient taking differently: Take 150 mg by mouth daily with  breakfast. ) 90 capsule 1  . crizotinib (XALKORI) 250 MG capsule Take 1 capsule (250 mg total) by mouth 2 (two) times daily. (Patient not taking: Reported on 08/09/2018) 60 capsule 0  . ondansetron (ZOFRAN) 8  MG tablet Take 1 tablet (8 mg total) by mouth every 8 (eight) hours as needed for nausea, vomiting or refractory nausea / vomiting. 60 tablet 2  . oxyCODONE (ROXICODONE) 5 MG immediate release tablet Take 1 tablet (5 mg total) by mouth every 6 (six) hours as needed for moderate pain or severe pain. (Patient not taking: Reported on 08/09/2018) 30 tablet 0  . predniSONE (DELTASONE) 20 MG tablet Take 1 tablet (20 mg total) by mouth daily. (Patient not taking: Reported on 08/09/2018) 7 tablet 0   No current facility-administered medications for this visit.      PHYSICAL EXAMINATION: ECOG PERFORMANCE STATUS: 1 - Symptomatic but completely ambulatory Vitals:   08/09/18 0854  BP: 108/72  Pulse: 69  Resp: 18  Temp: (!) 96.4 F (35.8 C)  SpO2: 96%   Filed Weights   08/09/18 0854  Weight: 148 lb 12.8 oz (67.5 kg)    Physical Exam  Constitutional: She is oriented to person, place, and time. No distress.  HENT:  Head: Normocephalic and atraumatic.  Nose: Nose normal.  Mouth/Throat: Oropharynx is clear and moist. No oropharyngeal exudate.  Eyes: Pupils are equal, round, and reactive to light. EOM are normal. No scleral icterus.  Neck: Normal range of motion. Neck supple.  Cardiovascular: Normal rate and regular rhythm.  No murmur heard. Pulmonary/Chest: Effort normal. No respiratory distress. She has no rales. She exhibits no tenderness.  Abdominal: Soft. She exhibits no distension. There is no abdominal tenderness.  Musculoskeletal: Normal range of motion.        General: No edema.  Neurological: She is alert and oriented to person, place, and time. No cranial nerve deficit. She exhibits normal muscle tone. Coordination normal.  Skin: Skin is warm and dry. She is not diaphoretic. No  erythema.  Psychiatric: Affect normal.     CMP Latest Ref Rng & Units 08/09/2018  Glucose 70 - 99 mg/dL 109(H)  BUN 8 - 23 mg/dL 25(H)  Creatinine 0.44 - 1.00 mg/dL 1.14(H)  Sodium 135 - 145 mmol/L 136  Potassium 3.5 - 5.1 mmol/L 4.0  Chloride 98 - 111 mmol/L 106  CO2 22 - 32 mmol/L 27  Calcium 8.9 - 10.3 mg/dL 8.5(L)  Total Protein 6.5 - 8.1 g/dL 6.8  Total Bilirubin 0.3 - 1.2 mg/dL 0.6  Alkaline Phos 38 - 126 U/L 91  AST 15 - 41 U/L 16  ALT 0 - 44 U/L 20   CBC Latest Ref Rng & Units 08/09/2018  WBC 4.0 - 10.5 K/uL 7.5  Hemoglobin 12.0 - 15.0 g/dL 12.0  Hematocrit 36.0 - 46.0 % 36.4  Platelets 150 - 400 K/uL 266    LABORATORY DATA:  I have reviewed the data as listed Lab Results  Component Value Date   WBC 7.5 08/09/2018   HGB 12.0 08/09/2018   HCT 36.4 08/09/2018   MCV 97.1 08/09/2018   PLT 266 08/09/2018   Recent Labs    11/11/17 1211 06/16/18 1154 08/09/18 0825  NA 142 140 136  K 4.5 3.9 4.0  CL 103 105 106  CO2 33* 28 27  GLUCOSE 97 122* 109*  BUN 32* 20 25*  CREATININE 0.98 0.93 1.14*  CALCIUM 9.5 8.7* 8.5*  GFRNONAA  --  58* 46*  GFRAA  --  >60 53*  PROT 6.5 7.0 6.8  ALBUMIN 4.2 3.8 4.0  AST _0 ALT _1 ALKPHOS 78 129* 91  BILITOT 0.6 0.5 0.6   Iron/TIBC/Ferritin/ %Sat No results  found for: IRON, TIBC, FERRITIN, IRONPCTSAT     RADIOGRAPHIC STUDIES: I have personally reviewed the radiological images as listed and agreed with the findings in the report. 06/21/2018 PET scan.  1. Left subareolar breast lesion with biopsy clip exhibits mild increased uptake with enlarged and hypermetabolic left axillary lymph node. 2. There is a dominant left upper lobe lung mass which exhibits moderate increased uptake and may represent a focus of metastatic disease from breast primary or primary lung neoplasm. Multiple additional small pulmonary nodules are scattered throughout both lungs which are worrisome for metastasis. 3. Hypermetabolic  bilateral hilar lymph nodes concerning for metastatic disease. 4.  Aortic Atherosclerosis (ICD10-I70.0).  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   ASSESSMENT & PLAN:  1. Malignant neoplasm of left female breast, unspecified estrogen receptor status, unspecified site of breast ([place])   2. Primary adenocarcinoma of lung, unspecified laterality ([place])   3. Goals of care, counseling/discussion   4. Elevated serum creatinine   Patient has 2 primaries.  cT2 N3 M1a stage IV non-small cell lung cancer, and stage IB cT1b,N1 M0  ER+PR+  grade 1 breast cancer.  #Given that patient has MET fusion mutation, I recommend patient to start crizotinib 250 mg twice daily. Rationale and side effects of crizotinib discussed with patient. Goal of care is with palliative intent. Discussed. Zofran PRN if nausea.  She is going to discuss with oral pharmacist team for paper work.  If crizotinib not able to effectively control his lung cancer, will proceed with immunotherapy+/- chemotherapy Check labs weekly.  Follow up in 2 weeks for evaluation of tolerability.   #For breast cancer, will start patient on Arimidex. Rationale of using aromatase inhibitor -Arimidex  discussed with patient.  Side effects of Letrozole including but not limited to hot flush, joint pain, fatigue, mood swing, osteoporosis discussed with patient. Patient voices understanding and willing to proceed.   # elevated creatinine, encourage patient to increase oral hydration. Repeat labs in 1 week.   Supportive care measures are necessary for patient well-being and will be provided as necessary. We spent sufficient time to discuss many aspect of care, questions were answered to patient's satisfaction.  All questions were answered. The patient knows to call the clinic with any problems questions or concerns.  Return of visit: 2 weeks.  We spent sufficient time to discuss many aspect of care, questions were answered to  patient's satisfaction. Total face to face encounter time for this patient visit was 25 min. >50% of the time was  spent in counseling and coordination of care.   Earlie Server, MD, PhD Hematology Oncology [facility] Pager- [phone] 08/09/2018

## 2018-08-09 NOTE — Telephone Encounter (Signed)
Scheduled medication to be delivered 3/18 from [facility].

## 2018-08-09 NOTE — Telephone Encounter (Signed)
Oral Oncology Patient Advocate Encounter  Prior Authorization for Jamie Matthews has been approved.    PA# [id number] Effective dates: 08/06/2018 through 05/26/2019  Patients co-pay is $24.00  Oral Oncology Clinic will continue to follow.   [place] [facility] [place] Phone [phone] Fax [phone] 08/09/2018 8:02 AM

## 2018-08-09 NOTE — Progress Notes (Signed)
Patient here for follow up. Pt complains of being "very tired."

## 2018-08-10 MED FILL — XALKORI 250 MG CAPSULE: 250 | 30 days supply | Qty: 60 | Fill #0

## 2018-08-13 ENCOUNTER — Other Ambulatory Visit: Payer: Self-pay

## 2018-08-16 ENCOUNTER — Inpatient Hospital Stay: Payer: Medicare Other

## 2018-08-16 ENCOUNTER — Other Ambulatory Visit: Payer: Self-pay

## 2018-08-16 DIAGNOSIS — C50912 Malignant neoplasm of unspecified site of left female breast: Secondary | ICD-10-CM

## 2018-08-16 DIAGNOSIS — C3412 Malignant neoplasm of upper lobe, left bronchus or lung: Secondary | ICD-10-CM | POA: Diagnosis not present

## 2018-08-16 DIAGNOSIS — C3490 Malignant neoplasm of unspecified part of unspecified bronchus or lung: Secondary | ICD-10-CM

## 2018-08-16 DIAGNOSIS — Z79811 Long term (current) use of aromatase inhibitors: Secondary | ICD-10-CM | POA: Diagnosis not present

## 2018-08-16 DIAGNOSIS — Z170 Estrogen receptor positive status [ER+]: Secondary | ICD-10-CM | POA: Diagnosis not present

## 2018-08-16 DIAGNOSIS — Z7189 Other specified counseling: Secondary | ICD-10-CM

## 2018-08-16 DIAGNOSIS — C773 Secondary and unspecified malignant neoplasm of axilla and upper limb lymph nodes: Secondary | ICD-10-CM | POA: Diagnosis not present

## 2018-08-16 DIAGNOSIS — R740 Nonspecific elevation of levels of transaminase and lactic acid dehydrogenase [LDH]: Secondary | ICD-10-CM | POA: Diagnosis not present

## 2018-08-16 DIAGNOSIS — R7989 Other specified abnormal findings of blood chemistry: Secondary | ICD-10-CM

## 2018-08-16 LAB — COMPREHENSIVE METABOLIC PANEL
ALT: 38 U/L (ref 0–44)
AST: 35 U/L (ref 15–41)
Albumin: 3.7 g/dL (ref 3.5–5.0)
Alkaline Phosphatase: 98 U/L (ref 38–126)
Anion gap: 9 (ref 5–15)
BUN: 25 mg/dL — ABNORMAL HIGH (ref 8–23)
CHLORIDE: 103 mmol/L (ref 98–111)
CO2: 27 mmol/L (ref 22–32)
Calcium: 8.2 mg/dL — ABNORMAL LOW (ref 8.9–10.3)
Creatinine, Ser: 1.29 mg/dL — ABNORMAL HIGH (ref 0.44–1.00)
GFR calc Af Amer: 46 mL/min — ABNORMAL LOW (ref >60)
GFR, EST NON AFRICAN AMERICAN: 39 mL/min — AB (ref >60)
Glucose, Bld: 170 mg/dL — ABNORMAL HIGH (ref 70–99)
Potassium: 4.1 mmol/L (ref 3.5–5.1)
Sodium: 139 mmol/L (ref 135–145)
Total Bilirubin: 0.5 mg/dL (ref 0.3–1.2)
Total Protein: 6.6 g/dL (ref 6.5–8.1)

## 2018-08-16 LAB — CBC WITH DIFFERENTIAL/PLATELET
Abs Immature Granulocytes: 0.03 10*3/uL (ref 0.00–0.07)
Basophils Absolute: 0 10*3/uL (ref 0.0–0.1)
Basophils Relative: 0 %
Eosinophils Absolute: 0.5 10*3/uL (ref 0.0–0.5)
Eosinophils Relative: 7 %
HCT: 35.6 % — ABNORMAL LOW (ref 36.0–46.0)
Hemoglobin: 11.3 g/dL — ABNORMAL LOW (ref 12.0–15.0)
Immature Granulocytes: 0 %
Lymphocytes Relative: 19 %
Lymphs Abs: 1.5 10*3/uL (ref 0.7–4.0)
MCH: 31.4 pg (ref 26.0–34.0)
MCHC: 31.7 g/dL (ref 30.0–36.0)
MCV: 98.9 fL (ref 80.0–100.0)
MONO ABS: 0.7 10*3/uL (ref 0.1–1.0)
Monocytes Relative: 9 %
Neutro Abs: 4.8 10*3/uL (ref 1.7–7.7)
Neutrophils Relative %: 65 %
Platelets: 215 10*3/uL (ref 150–400)
RBC: 3.6 MIL/uL — AB (ref 3.87–5.11)
RDW: 12.8 % (ref 11.5–15.5)
WBC: 7.6 10*3/uL (ref 4.0–10.5)
nRBC: 0 % (ref 0.0–0.2)

## 2018-08-17 ENCOUNTER — Other Ambulatory Visit: Payer: Self-pay | Admitting: Oncology

## 2018-08-17 ENCOUNTER — Encounter: Payer: Self-pay | Admitting: Oncology

## 2018-08-17 DIAGNOSIS — N179 Acute kidney failure, unspecified: Secondary | ICD-10-CM

## 2018-08-17 HISTORY — DX: Acute kidney failure, unspecified: N17.9

## 2018-08-18 ENCOUNTER — Encounter: Payer: Self-pay | Admitting: Family Medicine

## 2018-08-18 ENCOUNTER — Encounter: Payer: Self-pay | Admitting: *Deleted

## 2018-08-19 ENCOUNTER — Other Ambulatory Visit: Payer: Self-pay

## 2018-08-20 ENCOUNTER — Other Ambulatory Visit: Payer: Self-pay

## 2018-08-20 ENCOUNTER — Inpatient Hospital Stay: Payer: Medicare Other

## 2018-08-20 VITALS — BP 112/68 | HR 72 | Resp 18

## 2018-08-20 DIAGNOSIS — R740 Nonspecific elevation of levels of transaminase and lactic acid dehydrogenase [LDH]: Secondary | ICD-10-CM | POA: Diagnosis not present

## 2018-08-20 DIAGNOSIS — Z79811 Long term (current) use of aromatase inhibitors: Secondary | ICD-10-CM | POA: Diagnosis not present

## 2018-08-20 DIAGNOSIS — C773 Secondary and unspecified malignant neoplasm of axilla and upper limb lymph nodes: Secondary | ICD-10-CM | POA: Diagnosis not present

## 2018-08-20 DIAGNOSIS — C50912 Malignant neoplasm of unspecified site of left female breast: Secondary | ICD-10-CM | POA: Diagnosis not present

## 2018-08-20 DIAGNOSIS — Z170 Estrogen receptor positive status [ER+]: Secondary | ICD-10-CM | POA: Diagnosis not present

## 2018-08-20 DIAGNOSIS — C3412 Malignant neoplasm of upper lobe, left bronchus or lung: Secondary | ICD-10-CM | POA: Diagnosis not present

## 2018-08-20 DIAGNOSIS — N179 Acute kidney failure, unspecified: Secondary | ICD-10-CM

## 2018-08-20 LAB — BASIC METABOLIC PANEL
ANION GAP: 8 (ref 5–15)
BUN: 30 mg/dL — ABNORMAL HIGH (ref 8–23)
CO2: 28 mmol/L (ref 22–32)
Calcium: 7.7 mg/dL — ABNORMAL LOW (ref 8.9–10.3)
Chloride: 100 mmol/L (ref 98–111)
Creatinine, Ser: 1.41 mg/dL — ABNORMAL HIGH (ref 0.44–1.00)
GFR calc Af Amer: 41 mL/min — ABNORMAL LOW (ref >60)
GFR calc non Af Amer: 35 mL/min — ABNORMAL LOW (ref >60)
Glucose, Bld: 155 mg/dL — ABNORMAL HIGH (ref 70–99)
POTASSIUM: 4.3 mmol/L (ref 3.5–5.1)
Sodium: 136 mmol/L (ref 135–145)

## 2018-08-20 MED ORDER — SODIUM CHLORIDE 0.9 % IV SOLN
Freq: Once | INTRAVENOUS | Status: AC
  Administered 2018-08-20: 14:00:00 via INTRAVENOUS
  Filled 2018-08-20: qty 250

## 2018-08-23 ENCOUNTER — Other Ambulatory Visit: Payer: Self-pay

## 2018-08-23 ENCOUNTER — Inpatient Hospital Stay: Payer: Medicare Other

## 2018-08-23 ENCOUNTER — Telehealth: Payer: Self-pay

## 2018-08-23 ENCOUNTER — Encounter: Payer: Self-pay | Admitting: Oncology

## 2018-08-23 ENCOUNTER — Ambulatory Visit
Admission: RE | Admit: 2018-08-23 | Discharge: 2018-08-23 | Disposition: A | Discharge status: Home / Self Care | Payer: Medicare Other | Attending: Oncology | Admitting: Oncology

## 2018-08-23 ENCOUNTER — Inpatient Hospital Stay (HOSPITAL_BASED_OUTPATIENT_CLINIC_OR_DEPARTMENT_OTHER): Payer: Medicare Other | Admitting: Oncology

## 2018-08-23 ENCOUNTER — Other Ambulatory Visit: Payer: Self-pay | Admitting: Family Medicine

## 2018-08-23 ENCOUNTER — Ambulatory Visit
Admission: RE | Admit: 2018-08-23 | Discharge: 2018-08-23 | Disposition: A | Discharge status: Home / Self Care | Payer: Medicare Other | Source: Ambulatory Visit | Attending: Oncology | Admitting: Oncology

## 2018-08-23 VITALS — BP 111/59 | HR 62 | Temp 97.3°F | Resp 18 | Wt 157.1 lb

## 2018-08-23 DIAGNOSIS — C3490 Malignant neoplasm of unspecified part of unspecified bronchus or lung: Secondary | ICD-10-CM

## 2018-08-23 DIAGNOSIS — C3412 Malignant neoplasm of upper lobe, left bronchus or lung: Secondary | ICD-10-CM

## 2018-08-23 DIAGNOSIS — Z79811 Long term (current) use of aromatase inhibitors: Secondary | ICD-10-CM | POA: Diagnosis not present

## 2018-08-23 DIAGNOSIS — R05 Cough: Secondary | ICD-10-CM

## 2018-08-23 DIAGNOSIS — R7989 Other specified abnormal findings of blood chemistry: Secondary | ICD-10-CM

## 2018-08-23 DIAGNOSIS — R262 Difficulty in walking, not elsewhere classified: Secondary | ICD-10-CM | POA: Diagnosis not present

## 2018-08-23 DIAGNOSIS — E785 Hyperlipidemia, unspecified: Secondary | ICD-10-CM

## 2018-08-23 DIAGNOSIS — R5383 Other fatigue: Secondary | ICD-10-CM

## 2018-08-23 DIAGNOSIS — Z170 Estrogen receptor positive status [ER+]: Secondary | ICD-10-CM

## 2018-08-23 DIAGNOSIS — S32591A Other specified fracture of right pubis, initial encounter for closed fracture: Secondary | ICD-10-CM | POA: Diagnosis not present

## 2018-08-23 DIAGNOSIS — R1031 Right lower quadrant pain: Secondary | ICD-10-CM

## 2018-08-23 DIAGNOSIS — R740 Nonspecific elevation of levels of transaminase and lactic acid dehydrogenase [LDH]: Secondary | ICD-10-CM

## 2018-08-23 DIAGNOSIS — M25551 Pain in right hip: Secondary | ICD-10-CM

## 2018-08-23 DIAGNOSIS — Z79899 Other long term (current) drug therapy: Secondary | ICD-10-CM

## 2018-08-23 DIAGNOSIS — R911 Solitary pulmonary nodule: Secondary | ICD-10-CM | POA: Diagnosis not present

## 2018-08-23 DIAGNOSIS — K219 Gastro-esophageal reflux disease without esophagitis: Secondary | ICD-10-CM

## 2018-08-23 DIAGNOSIS — R059 Cough, unspecified: Secondary | ICD-10-CM

## 2018-08-23 DIAGNOSIS — Z803 Family history of malignant neoplasm of breast: Secondary | ICD-10-CM

## 2018-08-23 DIAGNOSIS — C50912 Malignant neoplasm of unspecified site of left female breast: Secondary | ICD-10-CM | POA: Diagnosis not present

## 2018-08-23 DIAGNOSIS — R7401 Elevation of levels of liver transaminase levels: Secondary | ICD-10-CM

## 2018-08-23 DIAGNOSIS — Z66 Do not resuscitate: Secondary | ICD-10-CM

## 2018-08-23 DIAGNOSIS — C773 Secondary and unspecified malignant neoplasm of axilla and upper limb lymph nodes: Secondary | ICD-10-CM

## 2018-08-23 DIAGNOSIS — Z8249 Family history of ischemic heart disease and other diseases of the circulatory system: Secondary | ICD-10-CM

## 2018-08-23 DIAGNOSIS — F419 Anxiety disorder, unspecified: Secondary | ICD-10-CM

## 2018-08-23 DIAGNOSIS — Z7189 Other specified counseling: Secondary | ICD-10-CM

## 2018-08-23 LAB — COMPREHENSIVE METABOLIC PANEL
ALT: 106 U/L — AB (ref 0–44)
AST: 48 U/L — ABNORMAL HIGH (ref 15–41)
Albumin: 3.3 g/dL — ABNORMAL LOW (ref 3.5–5.0)
Alkaline Phosphatase: 178 U/L — ABNORMAL HIGH (ref 38–126)
Anion gap: 8 (ref 5–15)
BUN: 23 mg/dL (ref 8–23)
CHLORIDE: 104 mmol/L (ref 98–111)
CO2: 27 mmol/L (ref 22–32)
Calcium: 8 mg/dL — ABNORMAL LOW (ref 8.9–10.3)
Creatinine, Ser: 1.13 mg/dL — ABNORMAL HIGH (ref 0.44–1.00)
GFR calc Af Amer: 54 mL/min — ABNORMAL LOW (ref >60)
GFR calc non Af Amer: 46 mL/min — ABNORMAL LOW (ref >60)
Glucose, Bld: 145 mg/dL — ABNORMAL HIGH (ref 70–99)
Potassium: 4.2 mmol/L (ref 3.5–5.1)
Sodium: 139 mmol/L (ref 135–145)
Total Bilirubin: 0.7 mg/dL (ref 0.3–1.2)
Total Protein: 6.3 g/dL — ABNORMAL LOW (ref 6.5–8.1)

## 2018-08-23 LAB — CBC WITH DIFFERENTIAL/PLATELET
Abs Immature Granulocytes: 0.05 10*3/uL (ref 0.00–0.07)
BASOS ABS: 0.1 10*3/uL (ref 0.0–0.1)
Basophils Relative: 1 %
EOS PCT: 9 %
Eosinophils Absolute: 0.6 10*3/uL — ABNORMAL HIGH (ref 0.0–0.5)
HCT: 33.6 % — ABNORMAL LOW (ref 36.0–46.0)
Hemoglobin: 10.7 g/dL — ABNORMAL LOW (ref 12.0–15.0)
Immature Granulocytes: 1 %
LYMPHS PCT: 21 %
Lymphs Abs: 1.5 10*3/uL (ref 0.7–4.0)
MCH: 31.6 pg (ref 26.0–34.0)
MCHC: 31.8 g/dL (ref 30.0–36.0)
MCV: 99.1 fL (ref 80.0–100.0)
Monocytes Absolute: 0.9 10*3/uL (ref 0.1–1.0)
Monocytes Relative: 13 %
Neutro Abs: 3.8 10*3/uL (ref 1.7–7.7)
Neutrophils Relative %: 55 %
Platelets: 276 10*3/uL (ref 150–400)
RBC: 3.39 MIL/uL — ABNORMAL LOW (ref 3.87–5.11)
RDW: 13.4 % (ref 11.5–15.5)
WBC: 7 10*3/uL (ref 4.0–10.5)
nRBC: 0 % (ref 0.0–0.2)

## 2018-08-23 MED ORDER — CYCLOBENZAPRINE HCL 5 MG PO TABS: 5.0000 mg | ORAL_TABLET | Freq: Three times a day (TID) | ORAL | 0 refills | Status: DC | PRN

## 2018-08-23 NOTE — Progress Notes (Addendum)
  Oncology Nurse Navigator Documentation  Navigator Location: [facility] (08/18/18 1500)   )Navigator Encounter Type: Telephone (08/18/18 1500) Telephone: Incoming Call (08/18/18 1500)                 Treatment Initiated Date: 08/11/18 (08/18/18 1500)     Barriers/Navigation Needs: No barriers at this time (08/18/18 1500)   Interventions: None required (08/18/18 1500)                      Time Spent with Patient: 15 (08/18/18 1500)

## 2018-08-23 NOTE — Progress Notes (Signed)
Hematology/Oncology follow up note [facility] [phone] [phone]   Patient Care Team: Lucille Passy, MD as PCP - General (Family Medicine) Harolyn Rutherford Sallyanne Havers, MD as Consulting Physician (Obstetrics and Gynecology) Hilarie Fredrickson, Lajuan Lines, MD as Consulting Physician (Gastroenterology) Telford Nab, RN as Registered Nurse   CHIEF COMPLAINTS/REASON FOR VISIT:  Evaluation of breast cancer  HISTORY OF PRESENTING ILLNESS:  Jamie Matthews is a  78 y.o.  female with PMH listed below who was referred to me for evaluation of breast cancer 05/05/2018 bilateral diagnostic breast mammogram showed suspicious mass 1.1cm  in the 12:00 retroareolar region of the left breast and the left axillary lymph node. 06/08/2018 patient status post a left breast retroareolar and left axillary lymph node biopsy. Pathology showed invasive mammary carcinoma, no special type, grade 1, left axillary lymph node positive for invasive mammary carcinoma clinically metastatic.  Background lymph node architecture is not identified. ER> 90% PR> 90%, HER-2 negative.  Patient present to establish care for newly diagnosed breast cancer. She has an appointment to see Dr. Dahlia Byes later today.  Denies history of hormone replacement therapy. Family history positive for sister who was diagnosed with breast cancer at age of 50. Recent episode of runny nose, nasal congestion, ear fullness and throat clearing cough. Was diagnosed with viral URI.   #  PET scan done which unfortunately showed additional hypermetabolic bilateral hilar lymph nodes as well as left upper lobe lung mass which may represent a focus of metastatic disease from breast, or primary lung neoplasm.  There is multiple additional small pulmonary nodules are scattered throughout both lungs which are worrisome for metastasis.  Index nodule within the medial right upper lobe measures 7 mm,, peri-broncho-vascular nodule in the  right lower lobe measures 1.2 cm.  # Biopsy of lung mass left upper lobe showed non-small cell lung cancer, favor adenocarcinoma.  #NGS came back patient has PD L1 is 70% TPS, MET fusion mutation.  INTERVAL HISTORY Jamie Matthews is a 78 y.o. female who has above history reviewed by me today presents for follow up visit for evaluation of tolerability of treatment for breast cancer and lung cancer.  Patient has been on Arimidex for 2 weeks.  Also started crizotinib 2 weeks. Reports tolerating well.  For Arimidex she denies any hot flash, joint achiness except that she had a fall last week and she landed on right hip.  Since then she has been having difficulty walking and bearing weight on right hip.  She also complains significant right groin pain. Pain is worsening with weightbearing and motion.  Crizotinib, denies any nausea vomiting, reports feeling tired. She walks with a cane to the clinic today. She also reports having dry cough since January.  Nonproductive.  She has history of allergy is not currently taking any allergy pills.  Review of Systems  Constitutional: Positive for fatigue. Negative for appetite change, chills and fever.  HENT:   Negative for hearing loss and voice change.   Eyes: Negative for eye problems.  Respiratory: Negative for chest tightness and cough.   Cardiovascular: Negative for chest pain.  Gastrointestinal: Negative for abdominal distention, abdominal pain and blood in stool.  Endocrine: Negative for hot flashes.  Genitourinary: Negative for difficulty urinating and frequency.   Musculoskeletal: Negative for arthralgias.       Right hip pain, status post fall  Skin: Negative for itching and rash.  Neurological: Negative for extremity weakness.  Hematological: Negative for adenopathy.  Psychiatric/Behavioral: Negative for  confusion.    MEDICAL HISTORY:  Past Medical History:  Diagnosis Date  . AKI (acute kidney injury) ([place]) 08/17/2018   . Anxiety   . Arthritis   . Belching   . Bladder disorder    OVERACTIVE  . Bowel dysfunction    BLOCKAGE  . Cancer ([place])   . Constipation   . Depression   . Diverticulitis   . Fibromyalgia   . GERD (gastroesophageal reflux disease)   . Hyperlipidemia   . IBS (irritable bowel syndrome)   . Internal hemorrhoids   . Memory deficits   . Murmur    asymptomatic  . Pneumonia 11/18/12  . Urinary incontinence   . Vertigo     SURGICAL HISTORY: Past Surgical History:  Procedure Laterality Date  . AXILLARY LYMPH NODE BIOPSY Left 06/08/2018   INVASIVE MAMMARY CARCINOMA  . BLADDER SUSPENSION  2004, 2012  . BREAST BIOPSY Left 06/08/2018   INVASIVE MAMMARY CARCINOMA  . CATARACT EXTRACTION W/PHACO Right 08/27/2015   Procedure: CATARACT EXTRACTION PHACO AND INTRAOCULAR LENS PLACEMENT (IOC);  Surgeon: Estill Cotta, MD;  Location: [facility] ORS;  Service: Ophthalmology;  Laterality: Right;  [place]   1:00.2 AP%  22.5 CDE  23.67 fluid casette lot #[id number]  exp05/31/2018  . CATARACT EXTRACTION W/PHACO Left 10/15/2015   Procedure: CATARACT EXTRACTION PHACO AND INTRAOCULAR LENS PLACEMENT (IOC);  Surgeon: Estill Cotta, MD;  Location: [facility] ORS;  Service: Ophthalmology;  Laterality: Left;  [place] 01:07 AP% 18.1 CDE 21.57 fluid pack lot # [id number]  . CHOLECYSTECTOMY    . COLONOSCOPY  2017  . ELECTROMAGNETIC NAVIGATION BROCHOSCOPY N/A 07/09/2018   Procedure: ELECTROMAGNETIC NAVIGATION BRONCHOSCOPY;  Surgeon: Flora Lipps, MD;  Location: [facility] ORS;  Service: Cardiopulmonary;  Laterality: N/A;  . TONSILLECTOMY  1947    SOCIAL HISTORY: Social History   Socioeconomic History  . Marital status: Married    Spouse name: Not on file  . Number of children: Not on file  . Years of education: Not on file  . Highest education level: Not on file  Occupational History  . Occupation: Retired  Scientific laboratory technician  . Financial resource strain: Not on file  . Food insecurity:    Worry: Not on file    Inability:  Not on file  . Transportation needs:    Medical: Not on file    Non-medical: Not on file  Tobacco Use  . Smoking status: Never Smoker  . Smokeless tobacco: Never Used  Substance and Sexual Activity  . Alcohol use: Yes    Comment: rare wine  . Drug use: No  . Sexual activity: Not Currently  Lifestyle  . Physical activity:    Days per week: Not on file    Minutes per session: Not on file  . Stress: Not on file  Relationships  . Social connections:    Talks on phone: Not on file    Gets together: Not on file    Attends religious service: Not on file    Active member of club or organization: Not on file    Attends meetings of clubs or organizations: Not on file    Relationship status: Not on file  . Intimate partner violence:    Fear of current or ex partner: Not on file    Emotionally abused: Not on file    Physically abused: Not on file    Forced sexual activity: Not on file  Other Topics Concern  . Not on file  Social History Narrative   Recently moved with her  husband to [place] from [place].   Husband is a retired Pharmacist, community.   No children.   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    FAMILY HISTORY: Family History  Problem Relation Age of Onset  . Diabetes Father   . Heart disease Father   . Lymphoma Father   . Heart disease Mother   . Breast cancer Sister 45  . Colon cancer Neg Hx   . Esophageal cancer Neg Hx   . Rectal cancer Neg Hx   . Stomach cancer Neg Hx   . Bladder Cancer Neg Hx   . Kidney cancer Neg Hx     ALLERGIES:  is allergic to sulfa antibiotics.  MEDICATIONS:  Current Outpatient Medications  Medication Sig Dispense Refill  . acetaminophen (TYLENOL) 325 MG tablet Take 325 mg by mouth every 6 (six) hours as needed (for pain).    [name] anastrozole (ARIMIDEX) 1 MG tablet Take 1 tablet (1 mg total) by mouth daily. 30 tablet 3  . atorvastatin (LIPITOR) 20 MG tablet TAKE 1 TABLET DAILY (Patient taking differently: Take 20 mg by mouth at  bedtime. ) 90 tablet 3  . benzonatate (TESSALON) 200 MG capsule Take 1 capsule (200 mg total) by mouth 2 (two) times daily as needed for cough. 20 capsule 0  . buPROPion (WELLBUTRIN XL) 150 MG 24 hr tablet TAKE ONE TABLET BY MOUTH EVERY DAY (Patient taking differently: Take 150 mg by mouth every morning. ) 90 tablet 2  . crizotinib (XALKORI) 250 MG capsule Take 1 capsule (250 mg total) by mouth 2 (two) times daily. 60 capsule 0  . desonide (DESOWEN) 0.05 % lotion Apply 1 application topically as needed. After showering    . DEXILANT 60 MG capsule TAKE 1 CAPSULE BY MOUTH ONCE DAILY (Patient taking differently: Take 60 mg by mouth every morning. ) 90 capsule 0  . dextromethorphan-guaiFENesin (MUCINEX DM) 30-600 MG 12hr tablet Take 1 tablet by mouth 2 (two) times daily as needed (congestion/cold).     . diphenhydrAMINE (BENADRYL) 25 MG tablet Take 25 mg by mouth every 8 (eight) hours as needed (allergies).     . famotidine (PEPCID) 20 MG tablet Take 20 mg by mouth 2 (two) times daily.    . fluticasone (FLONASE) 50 MCG/ACT nasal spray Place 2 sprays into both nostrils daily. 16 g 6  . ketoconazole (NIZORAL) 2 % shampoo Apply 1 application topically once a week.     . lubiprostone (AMITIZA) 24 MCG capsule TAKE ONE CAPSULE TWICE A DAY WITH MEALS (Patient taking differently: Take 24 mcg by mouth 2 (two) times daily. ) 60 capsule 3  . memantine (NAMENDA) 10 MG tablet TAKE 1 TABLET BY MOUTH TWICE DAILY (Patient taking differently: Take 10 mg by mouth 2 (two) times daily. ) 180 tablet 2  . mometasone (ELOCON) 0.1 % cream Apply 1 application topically daily. (Patient taking differently: Apply 1 application topically daily as needed (itching.). ) 45 g 0  . Multiple Vitamin (MULTIVITAMIN WITH MINERALS) TABS tablet Take 1 tablet by mouth daily.    [name] MYRBETRIQ 50 MG TB24 tablet TAKE 1 TABLET BY MOUTH DAILY (Patient taking differently: Take 50 mg by mouth every morning. ) 90 tablet 1  . ondansetron (ZOFRAN) 8 MG  tablet Take 1 tablet (8 mg total) by mouth every 8 (eight) hours as needed for nausea, vomiting or refractory nausea / vomiting. 60 tablet 2  . polyethylene glycol (MIRALAX / GLYCOLAX) packet Take  17 g by mouth daily.    . promethazine-dextromethorphan (PROMETHAZINE-DM) 6.25-15 MG/5ML syrup Take 5 mLs by mouth 4 (four) times daily as needed. 118 mL 0  . solifenacin (VESICARE) 5 MG tablet Take 1 tablet (5 mg total) by mouth daily. 30 tablet 3  . TOVIAZ 4 MG TB24 tablet TAKE 1 TABLET BY MOUTH DAILY (Patient taking differently: Take 4 mg by mouth at bedtime. ) 30 each 5  . triamcinolone (NASACORT) 55 MCG/ACT AERO nasal inhaler Place 1 spray into the nose as needed.     . venlafaxine XR (EFFEXOR-XR) 150 MG 24 hr capsule TAKE 1 CAPSULE BY MOUTH DAILY (Patient taking differently: Take 150 mg by mouth daily with breakfast. ) 90 capsule 1  . cyclobenzaprine (FLEXERIL) 5 MG tablet Take 1 tablet (5 mg total) by mouth 3 (three) times daily as needed for muscle spasms. 30 tablet 0  . oxyCODONE (ROXICODONE) 5 MG immediate release tablet Take 1 tablet (5 mg total) by mouth every 6 (six) hours as needed for moderate pain or severe pain. (Patient not taking: Reported on 08/09/2018) 30 tablet 0  . predniSONE (DELTASONE) 20 MG tablet Take 1 tablet (20 mg total) by mouth daily. (Patient not taking: Reported on 08/09/2018) 7 tablet 0   No current facility-administered medications for this visit.      PHYSICAL EXAMINATION: ECOG PERFORMANCE STATUS: 1 - Symptomatic but completely ambulatory Vitals:   08/23/18 1028  BP: (!) 111/59  Pulse: 62  Resp: 18  Temp: (!) 97.3 F (36.3 C)   Filed Weights   08/23/18 1028  Weight: 157 lb 1.6 oz (71.3 kg)    Physical Exam  Constitutional: She is oriented to person, place, and time. No distress.  HENT:  Head: Normocephalic and atraumatic.  Nose: Nose normal.  Mouth/Throat: Oropharynx is clear and moist. No oropharyngeal exudate.  Eyes: Pupils are equal, round, and  reactive to light. EOM are normal. No scleral icterus.  Neck: Normal range of motion. Neck supple.  Cardiovascular: Normal rate and regular rhythm.  No murmur heard. Pulmonary/Chest: Effort normal. No respiratory distress. She has no rales. She exhibits no tenderness.  Abdominal: Soft. She exhibits no distension. There is no abdominal tenderness.  Musculoskeletal:        General: No edema.     Comments: Right lower extremity range of motion limited due to pain.  Neurological: She is alert and oriented to person, place, and time. No cranial nerve deficit. She exhibits normal muscle tone. Coordination normal.  Skin: Skin is warm and dry. She is not diaphoretic. No erythema.  Psychiatric: Affect normal.     CMP Latest Ref Rng & Units 08/23/2018  Glucose 70 - 99 mg/dL 145(H)  BUN 8 - 23 mg/dL 23  Creatinine 0.44 - 1.00 mg/dL 1.13(H)  Sodium 135 - 145 mmol/L 139  Potassium 3.5 - 5.1 mmol/L 4.2  Chloride 98 - 111 mmol/L 104  CO2 22 - 32 mmol/L 27  Calcium 8.9 - 10.3 mg/dL 8.0(L)  Total Protein 6.5 - 8.1 g/dL 6.3(L)  Total Bilirubin 0.3 - 1.2 mg/dL 0.7  Alkaline Phos 38 - 126 U/L 178(H)  AST 15 - 41 U/L 48(H)  ALT 0 - 44 U/L 106(H)   CBC Latest Ref Rng & Units 08/23/2018  WBC 4.0 - 10.5 K/uL 7.0  Hemoglobin 12.0 - 15.0 g/dL 10.7(L)  Hematocrit 36.0 - 46.0 % 33.6(L)  Platelets 150 - 400 K/uL 276    LABORATORY DATA:  I have reviewed the data as listed  Lab Results  Component Value Date   WBC 7.0 08/23/2018   HGB 10.7 (L) 08/23/2018   HCT 33.6 (L) 08/23/2018   MCV 99.1 08/23/2018   PLT 276 08/23/2018   Recent Labs    08/09/18 0825 08/16/18 1120 08/20/18 1249 08/23/18 1001  NA 136 139 136 139  K 4.0 4.1 4.3 4.2  CL 106 103 100 104  CO2 '27 27 28 27  ' GLUCOSE 109* 170* 155* 145*  BUN 25* 25* 30* 23  CREATININE 1.14* 1.29* 1.41* 1.13*  CALCIUM 8.5* 8.2* 7.7* 8.0*  GFRNONAA 46* 39* 35* 46*  GFRAA 53* 46* 41* 54*  PROT 6.8 6.6  --  6.3*  ALBUMIN 4.0 3.7  --  3.3*  AST 16  35  --  48*  ALT 20 38  --  106*  ALKPHOS 91 98  --  178*  BILITOT 0.6 0.5  --  0.7   Iron/TIBC/Ferritin/ %Sat No results found for: IRON, TIBC, FERRITIN, IRONPCTSAT     RADIOGRAPHIC STUDIES: I have personally reviewed the radiological images as listed and agreed with the findings in the report. 06/21/2018 PET scan.  1. Left subareolar breast lesion with biopsy clip exhibits mild increased uptake with enlarged and hypermetabolic left axillary lymph node. 2. There is a dominant left upper lobe lung mass which exhibits moderate increased uptake and may represent a focus of metastatic disease from breast primary or primary lung neoplasm. Multiple additional small pulmonary nodules are scattered throughout both lungs which are worrisome for metastasis. 3. Hypermetabolic bilateral hilar lymph nodes concerning for metastatic disease. 4.  Aortic Atherosclerosis (ICD10-I70.0).  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   ASSESSMENT & PLAN:  1. Malignant neoplasm of left female breast, unspecified estrogen receptor status, unspecified site of breast ([place])   2. Cough   3. Pain of right hip joint   4. Primary adenocarcinoma of lung, unspecified laterality ([place])   5. Transaminitis   Patient has 2 primaries.  cT2 N3 M1a stage IV non-small cell lung cancer, and stage IB cT1b,N1 M0  ER+PR+  grade 1 breast cancer. 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 Labs reviewed and discussed with patient.  She has transaminitis likely secondary to medication.  Transaminitis ALT is about 2-3 times of upper normal limits.  Continue monitor.  Continue crizotinib.  Arimidex, seems to be tolerating.  No significant vasomotor symptoms.  Status post fall, right hip pain, with limited range of motion due to the pain.  I suspect fracture.  Will obtain right hip x-ray. Patient is taking Tylenol as needed for pain.  We will also start her on  Flexeril. Right hip x-ray was independently reviewed by me.  There is mildly displaced fracture involving the right superior and inferior pubic Rami. Will refer to orthopedic surgery urgently.  Dry cough since January.  Physical examination reviewed no wheezing.  Discussed with patient that coughing can be secondary to her lung cancer or due to allergy, or virus infection. Recommend patient to start trial of Claritin or Zyrtec see if that improves her cough symptoms.  #AKI has resolved. All questions were answered. The patient knows to call the clinic with any problems questions or concerns.  Orders Placed This Encounter  Procedures  . DG Chest 2 View    Standing Status:   Future    Number of Occurrences:   1    Standing Expiration Date:   08/23/2019    Order  Specific Question:   Reason for Exam (SYMPTOM  OR DIAGNOSIS REQUIRED)    Answer:   cough, wheezing    Order Specific Question:   Preferred imaging location?    Answer:   [facility]    Order Specific Question:   Radiology Contrast Protocol - do NOT remove file path    Answer:   \\charchive\epicdata\Radiant\DXFluoroContrastProtocols.pdf  . DG HIP UNILAT WITH PELVIS 2-3 VIEWS RIGHT    Standing Status:   Future    Number of Occurrences:   1    Standing Expiration Date:   10/23/2019    Order Specific Question:   Reason for Exam (SYMPTOM  OR DIAGNOSIS REQUIRED)    Answer:   fall, hip and groin pain    Order Specific Question:   Preferred imaging location?    Answer:   [facility]    Order Specific Question:   Radiology Contrast Protocol - do NOT remove file path    Answer:   \\charchive\epicdata\Radiant\DXFluoroContrastProtocols.pdf    Return of visit: 2 weeks.  We spent sufficient time to discuss many aspect of care, questions were answered to patient's satisfaction. Total face to face encounter time for this patient visit was 25 min. >50% of the time was  spent in counseling and coordination of care.    Earlie Server, MD, PhD Hematology Oncology [facility] Pager- [phone] 08/23/2018

## 2018-08-23 NOTE — Telephone Encounter (Signed)
Prior auth for cyclobenzaprine HCl 5 mg tablets has been submitted electronically and has beeen approved from 05/26/2018 through 05/25/2019. [facility] notified via fax   PA case [id number]

## 2018-08-23 NOTE — Progress Notes (Signed)
Patient here for follow up. Pt states she feels tired and weak. Pt had fall last week and states she pulled a muscle. Pain to groin area 9/10 and it it not getting better. Pt has a 9 pound weight gain (pt weight x3) since last visit, she states she has not been eating a lot so she is unsure where the weight is coming from.

## 2018-08-24 DIAGNOSIS — M6281 Muscle weakness (generalized): Secondary | ICD-10-CM | POA: Diagnosis not present

## 2018-08-24 DIAGNOSIS — R2689 Other abnormalities of gait and mobility: Secondary | ICD-10-CM | POA: Diagnosis not present

## 2018-08-24 DIAGNOSIS — W010XXA Fall on same level from slipping, tripping and stumbling without subsequent striking against object, initial encounter: Secondary | ICD-10-CM | POA: Diagnosis not present

## 2018-08-24 DIAGNOSIS — R278 Other lack of coordination: Secondary | ICD-10-CM | POA: Diagnosis not present

## 2018-08-24 DIAGNOSIS — S32591A Other specified fracture of right pubis, initial encounter for closed fracture: Secondary | ICD-10-CM | POA: Diagnosis not present

## 2018-08-24 MED ORDER — PROMETHAZINE-DM 6.25-15 MG/5ML PO SYRP: 5.0000 mL | ORAL_SOLUTION | Freq: Four times a day (QID) | ORAL | 0 refills | Status: DC | PRN

## 2018-08-24 NOTE — Telephone Encounter (Signed)
ZY-Plz see Rx request/I see recent visit for cough with you/plz send in if appropriate/thx dmf

## 2018-08-25 ENCOUNTER — Other Ambulatory Visit: Payer: Self-pay | Admitting: Family Medicine

## 2018-08-25 DIAGNOSIS — J069 Acute upper respiratory infection, unspecified: Secondary | ICD-10-CM

## 2018-08-25 NOTE — Telephone Encounter (Signed)
Rx Refill requested . Sent to Dr. Deborra Medina for approval.

## 2018-08-26 DIAGNOSIS — R278 Other lack of coordination: Secondary | ICD-10-CM | POA: Diagnosis not present

## 2018-08-26 DIAGNOSIS — R4189 Other symptoms and signs involving cognitive functions and awareness: Secondary | ICD-10-CM | POA: Diagnosis not present

## 2018-08-26 DIAGNOSIS — Z741 Need for assistance with personal care: Secondary | ICD-10-CM | POA: Diagnosis not present

## 2018-08-26 DIAGNOSIS — M6281 Muscle weakness (generalized): Secondary | ICD-10-CM | POA: Diagnosis not present

## 2018-08-26 DIAGNOSIS — C3490 Malignant neoplasm of unspecified part of unspecified bronchus or lung: Secondary | ICD-10-CM | POA: Diagnosis not present

## 2018-08-26 DIAGNOSIS — R2689 Other abnormalities of gait and mobility: Secondary | ICD-10-CM | POA: Diagnosis not present

## 2018-08-28 ENCOUNTER — Other Ambulatory Visit: Payer: Self-pay | Admitting: Oncology

## 2018-08-28 DIAGNOSIS — C3490 Malignant neoplasm of unspecified part of unspecified bronchus or lung: Secondary | ICD-10-CM

## 2018-08-30 ENCOUNTER — Other Ambulatory Visit: Payer: Self-pay | Admitting: Internal Medicine

## 2018-08-30 ENCOUNTER — Encounter: Payer: Self-pay | Admitting: *Deleted

## 2018-08-30 DIAGNOSIS — R4189 Other symptoms and signs involving cognitive functions and awareness: Secondary | ICD-10-CM | POA: Diagnosis not present

## 2018-08-30 DIAGNOSIS — R278 Other lack of coordination: Secondary | ICD-10-CM | POA: Diagnosis not present

## 2018-08-30 DIAGNOSIS — R2689 Other abnormalities of gait and mobility: Secondary | ICD-10-CM | POA: Diagnosis not present

## 2018-08-30 DIAGNOSIS — M6281 Muscle weakness (generalized): Secondary | ICD-10-CM | POA: Diagnosis not present

## 2018-08-30 DIAGNOSIS — Z741 Need for assistance with personal care: Secondary | ICD-10-CM | POA: Diagnosis not present

## 2018-08-30 DIAGNOSIS — C3490 Malignant neoplasm of unspecified part of unspecified bronchus or lung: Secondary | ICD-10-CM | POA: Diagnosis not present

## 2018-08-30 NOTE — Telephone Encounter (Signed)
CBC with Differential/Platelet  Order: [id number]  Status:  Final result  Visible to patient:  No (Not Released)  Next appt:  09/06/2018 at 12:45 PM in Oncology ([facility])  Dx:  Elevated serum creatinine; Primary ad...   Ref Range & Units 7d ago (08/23/18) 2wk ago (08/16/18) 3wk ago (08/09/18) 54mo ago (06/16/18) 107mo ago (09/28/17) 24yr ago (12/03/16) 56yr ago (10/14/16)  WBC 4.0 - 10.5 K/uL 7.0  7.6  7.5  9.7  6.1  5.9  6.9   RBC 3.87 - 5.11 MIL/uL 3.39Low   3.60Low   3.75Low   3.79Low   3.65Low  R 3.89 R 3.72Low  R  Hemoglobin 12.0 - 15.0 g/dL 10.7Low   11.3Low   12.0  11.7Low   12.1  12.6  12.3   HCT 36.0 - 46.0 % 33.6Low   35.6Low   36.4  36.7  35.4Low   36.7  35.8Low    MCV 80.0 - 100.0 fL 99.1  98.9  97.1  96.8  96.8 R 94.3 R 96.1 R  MCH 26.0 - 34.0 pg 31.6  31.4  32.0  30.9      MCHC 30.0 - 36.0 g/dL 31.8  31.7  33.0  31.9  34.3  34.3  34.3   RDW 11.5 - 15.5 % 13.4  12.8  13.0  13.0  13.2  13.2  13.4   Platelets 150 - 400 K/uL 276  215  266  322  230.0 R 240.0 R 256.0 R  nRBC 0.0 - 0.2 % 0.0  0.0  0.0  0.0      Neutrophils Relative % % 55  65  55  63   64.6 R 66.5 R  Neutro Abs 1.7 - 7.7 K/uL 3.8  4.8  4.2  6.2   3.8 R 4.6 R  Lymphocytes Relative % 21  19  24  16    20.5 R 13.3 R  Lymphs Abs 0.7 - 4.0 K/uL 1.5  1.5  1.8  1.6   1.2  0.9   Monocytes Relative % 13  9  10  11    7.7 R 11.2 R  Monocytes Absolute 0.1 - 1.0 K/uL 0.9  0.7  0.8  1.1High    0.5  0.8   Eosinophils Relative % 9  7  9  7    6.6High  R 8.1High  R  Eosinophils Absolute 0.0 - 0.5 K/uL 0.6High   0.5  0.7High   0.7High    0.4 R 0.6 R  Basophils Relative % 1  0  1  1   0.6 R 0.9 R  Basophils Absolute 0.0 - 0.1 K/uL 0.1  0.0  0.0  0.1   0.0  0.1   Immature Granulocytes % 1  0  1  2      Abs Immature Granulocytes 0.00 - 0.07 K/uL 0.05  0.03 CM 0.04 CM 0.15High  CM     Comment: Performed at [facility], [address] [place], [place] [address]  Resulting Agency  [facility] CLIN LAB [place] CL[facility] LAB [facility] CLIN LAB [facility] CLIN LAB  [place] [facility]      Specimen Collected: 08/23/18 10:01  Last Resulted: 08/23/18 10:13     Lab Flowsheet    Order Details    View Encounter    Lab and Collection Details    Routing    Result History      CM=Additional commentsR=Reference range differs from displayed range  Other Results from 08/23/2018   Contains abnormal data Comprehensive metabolic panel  Order: [id number]   Status:  Final result  Visible to patient:  No (Not Released)  Next appt:  09/06/2018 at 12:45 PM in Oncology ([facility])  Dx:  Elevated serum creatinine; Primary ad...   Ref Range & Units 7d ago 10d ago 2wk ago 3wk ago 42mo ago 53mo ago 40yr ago  Sodium 135 - 145 mmol/L 139  136  139  136  140  142 R 139 R  Potassium 3.5 - 5.1 mmol/L 4.2  4.3  4.1  4.0  3.9  4.5 R 4.1 R  Chloride 98 - 111 mmol/L 104  100  103  106  105  103 R 104 R  CO2 22 - 32 mmol/L 27  28  27  27  28   33High  R 27 R  Glucose, Bld 70 - 99 mg/dL 145High   155High   170High   109High   122High   97  89   BUN 8 - 23 mg/dL 23  30High   25High   25High   20  32High  R 25High  R  Creatinine, Ser 0.44 - 1.00 mg/dL 1.13High   1.41High   1.29High   1.14High   0.93  0.98 R 1.00 R  Calcium 8.9 - 10.3 mg/dL 8.0Low   7.7Low   8.2Low   8.5Low   8.7Low   9.5 R 9.3 R  Total Protein 6.5 - 8.1 g/dL 6.3Low    6.6  6.8  7.0  6.5 R 6.7 R  Albumin 3.5 - 5.0 g/dL 3.3Low    3.7  4.0  3.8  4.2 R 4.4 R  AST 15 - 41 U/L 48High    35  16  17  20  R 17 R  ALT 0 - 44 U/L 106High    38  20  25  31  R 17 R  Alkaline Phosphatase 38 - 126 U/L 178High    98  91  129High   78 R 70 R  Total Bilirubin 0.3 - 1.2 mg/dL 0.7   0.5  0.6  0.5  0.6 R 0.4 R  GFR calc non Af Amer >60 mL/min 46Low   35Low   39Low   46Low   58Low      GFR calc Af Amer >60 mL/min 54Low   41Low   46Low   53Low   >60     Anion gap 5 - 15 8  8  CM 9 CM 3Low  CM 7 CM    Comment: Performed at [facility], [address] [place], [place] [address]   Resulting Agency  [facility] CLIN LAB [place] CL[facility] LAB [place] [facility] LAB [place] [facility] [place] [facility] [place]      Specimen Collected: 08/23/18 10:01  Last Resulted: 08/23/18 10:26

## 2018-08-30 NOTE — Progress Notes (Signed)
Left message on voicemail to call if she has any questions or needs.

## 2018-09-01 DIAGNOSIS — Z741 Need for assistance with personal care: Secondary | ICD-10-CM | POA: Diagnosis not present

## 2018-09-01 DIAGNOSIS — R4189 Other symptoms and signs involving cognitive functions and awareness: Secondary | ICD-10-CM | POA: Diagnosis not present

## 2018-09-01 DIAGNOSIS — R2689 Other abnormalities of gait and mobility: Secondary | ICD-10-CM | POA: Diagnosis not present

## 2018-09-01 DIAGNOSIS — M6281 Muscle weakness (generalized): Secondary | ICD-10-CM | POA: Diagnosis not present

## 2018-09-01 DIAGNOSIS — C3490 Malignant neoplasm of unspecified part of unspecified bronchus or lung: Secondary | ICD-10-CM | POA: Diagnosis not present

## 2018-09-01 DIAGNOSIS — R278 Other lack of coordination: Secondary | ICD-10-CM | POA: Diagnosis not present

## 2018-09-03 DIAGNOSIS — M6281 Muscle weakness (generalized): Secondary | ICD-10-CM | POA: Diagnosis not present

## 2018-09-03 DIAGNOSIS — R4189 Other symptoms and signs involving cognitive functions and awareness: Secondary | ICD-10-CM | POA: Diagnosis not present

## 2018-09-03 DIAGNOSIS — C3490 Malignant neoplasm of unspecified part of unspecified bronchus or lung: Secondary | ICD-10-CM | POA: Diagnosis not present

## 2018-09-03 DIAGNOSIS — R2689 Other abnormalities of gait and mobility: Secondary | ICD-10-CM | POA: Diagnosis not present

## 2018-09-03 DIAGNOSIS — Z741 Need for assistance with personal care: Secondary | ICD-10-CM | POA: Diagnosis not present

## 2018-09-03 DIAGNOSIS — R278 Other lack of coordination: Secondary | ICD-10-CM | POA: Diagnosis not present

## 2018-09-05 ENCOUNTER — Other Ambulatory Visit: Payer: Self-pay

## 2018-09-06 ENCOUNTER — Inpatient Hospital Stay: Payer: Medicare Other | Attending: Oncology

## 2018-09-06 ENCOUNTER — Other Ambulatory Visit: Payer: Self-pay

## 2018-09-06 ENCOUNTER — Inpatient Hospital Stay: Payer: Medicare Other | Admitting: Oncology

## 2018-09-06 DIAGNOSIS — K219 Gastro-esophageal reflux disease without esophagitis: Secondary | ICD-10-CM | POA: Insufficient documentation

## 2018-09-06 DIAGNOSIS — S32509S Unspecified fracture of unspecified pubis, sequela: Secondary | ICD-10-CM | POA: Diagnosis not present

## 2018-09-06 DIAGNOSIS — M6281 Muscle weakness (generalized): Secondary | ICD-10-CM | POA: Diagnosis not present

## 2018-09-06 DIAGNOSIS — C3490 Malignant neoplasm of unspecified part of unspecified bronchus or lung: Secondary | ICD-10-CM | POA: Diagnosis not present

## 2018-09-06 DIAGNOSIS — R278 Other lack of coordination: Secondary | ICD-10-CM | POA: Diagnosis not present

## 2018-09-06 DIAGNOSIS — K589 Irritable bowel syndrome without diarrhea: Secondary | ICD-10-CM | POA: Diagnosis not present

## 2018-09-06 DIAGNOSIS — R740 Nonspecific elevation of levels of transaminase and lactic acid dehydrogenase [LDH]: Secondary | ICD-10-CM | POA: Insufficient documentation

## 2018-09-06 DIAGNOSIS — C50912 Malignant neoplasm of unspecified site of left female breast: Secondary | ICD-10-CM | POA: Diagnosis not present

## 2018-09-06 DIAGNOSIS — M25551 Pain in right hip: Secondary | ICD-10-CM | POA: Insufficient documentation

## 2018-09-06 DIAGNOSIS — F329 Major depressive disorder, single episode, unspecified: Secondary | ICD-10-CM | POA: Diagnosis not present

## 2018-09-06 DIAGNOSIS — Z9181 History of falling: Secondary | ICD-10-CM | POA: Insufficient documentation

## 2018-09-06 DIAGNOSIS — Z79899 Other long term (current) drug therapy: Secondary | ICD-10-CM | POA: Diagnosis not present

## 2018-09-06 DIAGNOSIS — Z741 Need for assistance with personal care: Secondary | ICD-10-CM | POA: Diagnosis not present

## 2018-09-06 DIAGNOSIS — E785 Hyperlipidemia, unspecified: Secondary | ICD-10-CM | POA: Insufficient documentation

## 2018-09-06 DIAGNOSIS — M8580 Other specified disorders of bone density and structure, unspecified site: Secondary | ICD-10-CM | POA: Insufficient documentation

## 2018-09-06 DIAGNOSIS — R7989 Other specified abnormal findings of blood chemistry: Secondary | ICD-10-CM

## 2018-09-06 DIAGNOSIS — R4189 Other symptoms and signs involving cognitive functions and awareness: Secondary | ICD-10-CM | POA: Diagnosis not present

## 2018-09-06 DIAGNOSIS — Z79811 Long term (current) use of aromatase inhibitors: Secondary | ICD-10-CM | POA: Diagnosis not present

## 2018-09-06 DIAGNOSIS — R2689 Other abnormalities of gait and mobility: Secondary | ICD-10-CM | POA: Diagnosis not present

## 2018-09-06 DIAGNOSIS — F419 Anxiety disorder, unspecified: Secondary | ICD-10-CM | POA: Diagnosis not present

## 2018-09-06 DIAGNOSIS — Z7189 Other specified counseling: Secondary | ICD-10-CM

## 2018-09-06 DIAGNOSIS — X58XXXS Exposure to other specified factors, sequela: Secondary | ICD-10-CM | POA: Diagnosis not present

## 2018-09-06 LAB — COMPREHENSIVE METABOLIC PANEL
ALT: 30 U/L (ref 0–44)
AST: 24 U/L (ref 15–41)
Albumin: 3.3 g/dL — ABNORMAL LOW (ref 3.5–5.0)
Alkaline Phosphatase: 168 U/L — ABNORMAL HIGH (ref 38–126)
Anion gap: 7 (ref 5–15)
BUN: 20 mg/dL (ref 8–23)
CO2: 28 mmol/L (ref 22–32)
Calcium: 8.3 mg/dL — ABNORMAL LOW (ref 8.9–10.3)
Chloride: 105 mmol/L (ref 98–111)
Creatinine, Ser: 1.05 mg/dL — ABNORMAL HIGH (ref 0.44–1.00)
GFR calc Af Amer: 58 mL/min — ABNORMAL LOW (ref >60)
GFR calc non Af Amer: 50 mL/min — ABNORMAL LOW (ref >60)
Glucose, Bld: 120 mg/dL — ABNORMAL HIGH (ref 70–99)
Potassium: 4.1 mmol/L (ref 3.5–5.1)
Sodium: 140 mmol/L (ref 135–145)
Total Bilirubin: 0.6 mg/dL (ref 0.3–1.2)
Total Protein: 6.3 g/dL — ABNORMAL LOW (ref 6.5–8.1)

## 2018-09-06 LAB — CBC WITH DIFFERENTIAL/PLATELET
Abs Immature Granulocytes: 0.07 10*3/uL (ref 0.00–0.07)
Basophils Absolute: 0.1 10*3/uL (ref 0.0–0.1)
Basophils Relative: 1 %
Eosinophils Absolute: 0.4 10*3/uL (ref 0.0–0.5)
Eosinophils Relative: 7 %
HCT: 35.1 % — ABNORMAL LOW (ref 36.0–46.0)
Hemoglobin: 11.2 g/dL — ABNORMAL LOW (ref 12.0–15.0)
Immature Granulocytes: 1 %
Lymphocytes Relative: 25 %
Lymphs Abs: 1.6 10*3/uL (ref 0.7–4.0)
MCH: 31.3 pg (ref 26.0–34.0)
MCHC: 31.9 g/dL (ref 30.0–36.0)
MCV: 98 fL (ref 80.0–100.0)
Monocytes Absolute: 1.3 10*3/uL — ABNORMAL HIGH (ref 0.1–1.0)
Monocytes Relative: 20 %
Neutro Abs: 2.9 10*3/uL (ref 1.7–7.7)
Neutrophils Relative %: 46 %
Platelets: 440 10*3/uL — ABNORMAL HIGH (ref 150–400)
RBC: 3.58 MIL/uL — ABNORMAL LOW (ref 3.87–5.11)
RDW: 13.5 % (ref 11.5–15.5)
WBC: 6.2 10*3/uL (ref 4.0–10.5)
nRBC: 0 % (ref 0.0–0.2)

## 2018-09-06 MED FILL — XALKORI 250 MG CAPSULE: 250 | 30 days supply | Qty: 60 | Fill #0

## 2018-09-07 ENCOUNTER — Other Ambulatory Visit: Payer: Self-pay

## 2018-09-07 ENCOUNTER — Telehealth: Payer: Self-pay | Admitting: *Deleted

## 2018-09-07 ENCOUNTER — Encounter: Payer: Self-pay | Admitting: Oncology

## 2018-09-07 ENCOUNTER — Inpatient Hospital Stay (HOSPITAL_BASED_OUTPATIENT_CLINIC_OR_DEPARTMENT_OTHER): Payer: Medicare Other | Admitting: Oncology

## 2018-09-07 DIAGNOSIS — R4189 Other symptoms and signs involving cognitive functions and awareness: Secondary | ICD-10-CM | POA: Diagnosis not present

## 2018-09-07 DIAGNOSIS — Z9181 History of falling: Secondary | ICD-10-CM | POA: Diagnosis not present

## 2018-09-07 DIAGNOSIS — C50912 Malignant neoplasm of unspecified site of left female breast: Secondary | ICD-10-CM | POA: Diagnosis not present

## 2018-09-07 DIAGNOSIS — M25551 Pain in right hip: Secondary | ICD-10-CM | POA: Diagnosis not present

## 2018-09-07 DIAGNOSIS — C3490 Malignant neoplasm of unspecified part of unspecified bronchus or lung: Secondary | ICD-10-CM | POA: Diagnosis not present

## 2018-09-07 DIAGNOSIS — Z79811 Long term (current) use of aromatase inhibitors: Secondary | ICD-10-CM | POA: Diagnosis not present

## 2018-09-07 DIAGNOSIS — Z741 Need for assistance with personal care: Secondary | ICD-10-CM | POA: Diagnosis not present

## 2018-09-07 DIAGNOSIS — S32509S Unspecified fracture of unspecified pubis, sequela: Secondary | ICD-10-CM

## 2018-09-07 DIAGNOSIS — R740 Nonspecific elevation of levels of transaminase and lactic acid dehydrogenase [LDH]: Secondary | ICD-10-CM | POA: Diagnosis not present

## 2018-09-07 DIAGNOSIS — R7401 Elevation of levels of liver transaminase levels: Secondary | ICD-10-CM

## 2018-09-07 DIAGNOSIS — M8580 Other specified disorders of bone density and structure, unspecified site: Secondary | ICD-10-CM

## 2018-09-07 DIAGNOSIS — R278 Other lack of coordination: Secondary | ICD-10-CM | POA: Diagnosis not present

## 2018-09-07 DIAGNOSIS — M6281 Muscle weakness (generalized): Secondary | ICD-10-CM | POA: Diagnosis not present

## 2018-09-07 DIAGNOSIS — R2689 Other abnormalities of gait and mobility: Secondary | ICD-10-CM | POA: Diagnosis not present

## 2018-09-07 NOTE — Progress Notes (Signed)
Called patient for WebEx visit.  Patient c/o right hip, thigh and lower back pain from a fall about 2 weeks ago, patient states no pain today.  Patient c/o swelling in both feet that started a couple months ago.

## 2018-09-07 NOTE — Telephone Encounter (Signed)
Pt called to ask if should put physical therapy on hold at this time until gets results back from xray. Also, pt asked if needed an appt to get her xray done.   Called pt back without answer. Message left that it would be a good idea to hold PT for now until Dr. Tasia Catchings has more information regarding her back pain. Also informed that she does not need an appt to get an xray and that she should [facility] the medical mall to check in at the registration desk to get her xray done at any time during business hours. Instructed to call back if has any further questions or needs.

## 2018-09-07 NOTE — Telephone Encounter (Signed)
Is the hospital doing xrays now? I told pt to go to [address] [address] earlier today and I told her she didn't need an appointment.

## 2018-09-07 NOTE — Progress Notes (Signed)
HEMATOLOGY-ONCOLOGY TeleHEALTH VISIT PROGRESS NOTE  I connected with [name] on 09/07/18 at 10:45 AM EDT by video enabled telemedicine visit and verified that I am speaking with the correct person using two identifiers. I discussed the limitations, risks, security and privacy concerns of performing an evaluation and management service by telemedicine and the availability of in-person appointments. I also discussed with the patient that there may be a patient responsible charge related to this service. The patient expressed understanding and agreed to proceed.   Other persons participating in the visit and their role in the encounter:  Geraldine Solar, [place], check in patient   Janeann Merl, RN, check in patient.   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  INTERVAL HISTORY Jamie Matthews is a 78 y.o. female who has above history reviewed by me today presents for follow up visit for assessment of tolerability of chemotherapy for treatment of lung cancer and breast cancer. Problems and complaints are listed below:  08-09-18 started on crizotinib 250 mg twice daily, reports tolerating well.  She has been on the medication for 4 weeks. Denies fever, chills, abdominal pain, diarrhea. 08-09-2018 patient also has started on Arimidex reports tolerable.  Denies any vasomotor symptoms.  She had a history of fall and had a mildly displaced fractures involving the right inferior and superior pubic [place].  Patient has establish care with orthopedic surgeon.  No intervention needed. Patient reports that she had another fall 1 and half week ago reports that she is standing up too fast and lost balance.  She denies hitting her head.  Denies any loss of consciousness.  She reports that she fell forward and then rolled on her back. Reports having right hip,  thigh and lower back pain since then.  Pain is exacerbated with standing up and walking.   Review of Systems  Constitutional: Negative for appetite change, chills, fatigue and fever.  HENT:   Negative for hearing loss and voice change.   Eyes: Negative for eye problems.  Respiratory: Negative for chest tightness and cough.   Cardiovascular: Negative for chest pain.  Gastrointestinal: Negative for abdominal distention, abdominal pain and blood in stool.  Endocrine: Negative for hot flashes.  Genitourinary: Negative for difficulty urinating and frequency.   Musculoskeletal: Positive for back pain. Negative for arthralgias.       Right hip pain  Skin: Negative for itching and rash.  Neurological: Negative for extremity weakness.  Hematological: Negative for adenopathy.  Psychiatric/Behavioral: Negative for confusion.    Past Medical History:  Diagnosis Date  . AKI (acute kidney injury) ([place]) 08/17/2018  . Anxiety   . Arthritis   . Belching   . Bladder disorder    OVERACTIVE  . Bowel dysfunction    BLOCKAGE  . Cancer ([place])   . Constipation   . Depression   . Diverticulitis   . Fibromyalgia   . GERD (gastroesophageal reflux disease)   . Hyperlipidemia   . IBS (irritable bowel syndrome)   . Internal hemorrhoids   . Memory deficits   . Murmur    asymptomatic  . Pneumonia 11/18/12  . Urinary incontinence   . Vertigo    Past Surgical History:  Procedure Laterality Date  . AXILLARY LYMPH NODE BIOPSY Left 06/08/2018   INVASIVE MAMMARY CARCINOMA  . BLADDER SUSPENSION  2004, 2012  . BREAST BIOPSY Left 06/08/2018   INVASIVE MAMMARY CARCINOMA  .  CATARACT EXTRACTION W/PHACO Right 08/27/2015   Procedure: CATARACT EXTRACTION PHACO AND INTRAOCULAR LENS PLACEMENT (IOC);  Surgeon: Estill Cotta, MD;  Location: [facility] ORS;  Service: Ophthalmology;  Laterality: Right;  [place]   1:00.2 AP%  22.5 CDE  23.67 fluid casette lot #[id number]  exp05/31/2018  . CATARACT EXTRACTION W/PHACO Left  10/15/2015   Procedure: CATARACT EXTRACTION PHACO AND INTRAOCULAR LENS PLACEMENT (IOC);  Surgeon: Estill Cotta, MD;  Location: [facility] ORS;  Service: Ophthalmology;  Laterality: Left;  [place] 01:07 AP% 18.1 CDE 21.57 fluid pack lot # [id number]  . CHOLECYSTECTOMY    . COLONOSCOPY  2017  . ELECTROMAGNETIC NAVIGATION BROCHOSCOPY N/A 07/09/2018   Procedure: ELECTROMAGNETIC NAVIGATION BRONCHOSCOPY;  Surgeon: Flora Lipps, MD;  Location: [facility] ORS;  Service: Cardiopulmonary;  Laterality: N/A;  . TONSILLECTOMY  1947    Family History  Problem Relation Age of Onset  . Diabetes Father   . Heart disease Father   . Lymphoma Father   . Heart disease Mother   . Breast cancer Sister 1  . Colon cancer Neg Hx   . Esophageal cancer Neg Hx   . Rectal cancer Neg Hx   . Stomach cancer Neg Hx   . Bladder Cancer Neg Hx   . Kidney cancer Neg Hx     Social History   Socioeconomic History  . Marital status: Married    Spouse name: Not on file  . Number of children: Not on file  . Years of education: Not on file  . Highest education level: Not on file  Occupational History  . Occupation: Retired  Scientific laboratory technician  . Financial resource strain: Not on file  . Food insecurity:    Worry: Not on file    Inability: Not on file  . Transportation needs:    Medical: Not on file    Non-medical: Not on file  Tobacco Use  . Smoking status: Never Smoker  . Smokeless tobacco: Never Used  Substance and Sexual Activity  . Alcohol use: Yes    Comment: rare wine  . Drug use: No  . Sexual activity: Not Currently  Lifestyle  . Physical activity:    Days per week: Not on file    Minutes per session: Not on file  . Stress: Not on file  Relationships  . Social connections:    Talks on phone: Not on file    Gets together: Not on file    Attends religious service: Not on file    Active member of club or organization: Not on file    Attends meetings of clubs or organizations: Not on file    Relationship status:  Not on file  . Intimate partner violence:    Fear of current or ex partner: Not on file    Emotionally abused: Not on file    Physically abused: Not on file    Forced sexual activity: Not on file  Other Topics Concern  . Not on file  Social History Narrative   Recently moved with her husband to [place] from [place].   Husband is a retired Pharmacist, community.   No children.   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    Current Outpatient Medications on File Prior to Visit  Medication Sig Dispense Refill  . acetaminophen (TYLENOL) 325 MG tablet Take 325 mg by mouth every 6 (six) hours as needed (for pain).    [name] anastrozole (ARIMIDEX) 1 MG tablet Take 1 tablet (1 mg total)  by mouth daily. 30 tablet 3  . atorvastatin (LIPITOR) 20 MG tablet TAKE 1 TABLET DAILY (Patient taking differently: Take 20 mg by mouth at bedtime. ) 90 tablet 3  . buPROPion (WELLBUTRIN XL) 150 MG 24 hr tablet TAKE ONE TABLET BY MOUTH EVERY DAY (Patient taking differently: Take 150 mg by mouth every morning. ) 90 tablet 2  . cyclobenzaprine (FLEXERIL) 5 MG tablet Take 1 tablet (5 mg total) by mouth 3 (three) times daily as needed for muscle spasms. 30 tablet 0  . desonide (DESOWEN) 0.05 % lotion Apply 1 application topically as needed. After showering    . DEXILANT 60 MG capsule TAKE 1 CAPSULE BY MOUTH ONCE DAILY 90 capsule 0  . diphenhydrAMINE (BENADRYL) 25 MG tablet Take 25 mg by mouth every 8 (eight) hours as needed (allergies).     . famotidine (PEPCID) 20 MG tablet Take 20 mg by mouth 2 (two) times daily.    . fluticasone (FLONASE) 50 MCG/ACT nasal spray Place 2 sprays into both nostrils daily. 16 g 6  . ketoconazole (NIZORAL) 2 % shampoo Apply 1 application topically once a week.     . lubiprostone (AMITIZA) 24 MCG capsule TAKE ONE CAPSULE TWICE A DAY WITH MEALS (Patient taking differently: Take 24 mcg by mouth 2 (two) times daily. ) 60 capsule 3  . memantine (NAMENDA) 10 MG tablet TAKE 1 TABLET BY MOUTH TWICE DAILY  (Patient taking differently: Take 10 mg by mouth 2 (two) times daily. ) 180 tablet 2  . Multiple Vitamin (MULTIVITAMIN WITH MINERALS) TABS tablet Take 1 tablet by mouth daily.    [name] MYRBETRIQ 50 MG TB24 tablet TAKE 1 TABLET BY MOUTH DAILY (Patient taking differently: Take 50 mg by mouth every morning. ) 90 tablet 1  . ondansetron (ZOFRAN) 8 MG tablet Take 1 tablet (8 mg total) by mouth every 8 (eight) hours as needed for nausea, vomiting or refractory nausea / vomiting. 60 tablet 2  . polyethylene glycol (MIRALAX / GLYCOLAX) packet Take 17 g by mouth daily.    . promethazine-dextromethorphan (PROMETHAZINE-DM) 6.25-15 MG/5ML syrup Take 5 mLs by mouth 4 (four) times daily as needed. 240 mL 0  . solifenacin (VESICARE) 5 MG tablet Take 1 tablet (5 mg total) by mouth daily. 30 tablet 3  . TOVIAZ 4 MG TB24 tablet TAKE 1 TABLET BY MOUTH DAILY (Patient taking differently: Take 4 mg by mouth at bedtime. ) 30 each 5  . triamcinolone (NASACORT) 55 MCG/ACT AERO nasal inhaler Place 1 spray into the nose as needed.     . venlafaxine XR (EFFEXOR-XR) 150 MG 24 hr capsule TAKE 1 CAPSULE BY MOUTH DAILY (Patient taking differently: Take 150 mg by mouth daily with breakfast. ) 90 capsule 1  . XALKORI 250 MG capsule TAKE 1 CAPSULE (250 MG TOTAL) BY MOUTH 2 (TWO) TIMES DAILY. 60 capsule 0  . dextromethorphan-guaiFENesin (MUCINEX DM) 30-600 MG 12hr tablet Take 1 tablet by mouth 2 (two) times daily as needed (congestion/cold).      No current facility-administered medications on file prior to visit.     Allergies  Allergen Reactions  . Sulfa Antibiotics Rash       Observations/Objective: Today's Vitals   09/07/18 1106  PainSc: 0-No pain   There is no height or weight on file to calculate BMI.   CBC    Component Value Date/Time   WBC 6.2 09/06/2018 1305   RBC 3.58 (L) 09/06/2018 1305   HGB 11.2 (L) 09/06/2018 1305   HGB  12.6 06/12/2014 0518   HCT 35.1 (L) 09/06/2018 1305   HCT 37.9 06/12/2014 0518   PLT  440 (H) 09/06/2018 1305   PLT 204 06/12/2014 0518   MCV 98.0 09/06/2018 1305   MCV 96 06/12/2014 0518   MCH 31.3 09/06/2018 1305   MCHC 31.9 09/06/2018 1305   RDW 13.5 09/06/2018 1305   RDW 13.0 06/12/2014 0518   LYMPHSABS 1.6 09/06/2018 1305   LYMPHSABS 0.6 (L) 06/12/2014 0518   MONOABS 1.3 (H) 09/06/2018 1305   MONOABS 1.0 (H) 06/12/2014 0518   EOSABS 0.4 09/06/2018 1305   EOSABS 0.1 06/12/2014 0518   BASOSABS 0.1 09/06/2018 1305   BASOSABS 0.0 06/12/2014 0518    CMP     Component Value Date/Time   NA 140 09/06/2018 1305   NA 139 06/12/2014 0518   K 4.1 09/06/2018 1305   K 4.4 06/12/2014 0518   CL 105 09/06/2018 1305   CL 104 06/12/2014 0518   CO2 28 09/06/2018 1305   CO2 26 06/12/2014 0518   GLUCOSE 120 (H) 09/06/2018 1305   GLUCOSE 93 06/12/2014 0518   BUN 20 09/06/2018 1305   BUN 17 06/12/2014 0518   CREATININE 1.05 (H) 09/06/2018 1305   CREATININE 0.80 06/12/2014 0518   CALCIUM 8.3 (L) 09/06/2018 1305   CALCIUM 8.2 (L) 06/12/2014 0518   PROT 6.3 (L) 09/06/2018 1305   PROT 7.6 06/10/2014 1649   ALBUMIN 3.3 (L) 09/06/2018 1305   ALBUMIN 4.1 06/10/2014 1649   AST 24 09/06/2018 1305   AST 32 06/10/2014 1649   ALT 30 09/06/2018 1305   ALT 37 06/10/2014 1649   ALKPHOS 168 (H) 09/06/2018 1305   ALKPHOS 92 06/10/2014 1649   BILITOT 0.6 09/06/2018 1305   BILITOT 0.6 06/10/2014 1649   GFRNONAA 50 (L) 09/06/2018 1305   GFRNONAA >60 06/12/2014 0518   GFRNONAA >60 04/30/2013 2241   GFRAA 58 (L) 09/06/2018 1305   GFRAA >60 06/12/2014 0518   GFRAA >60 04/30/2013 2241     Assessment and Plan: 1. Primary adenocarcinoma of lung, unspecified laterality ([place])   2. Pain of right hip joint   3. Use of aromatase inhibitors   4. Malignant neoplasm of left female breast, unspecified estrogen receptor status, unspecified site of breast ([place])   5. Transaminitis   6. Osteopenia, unspecified location   7. At risk for falls     Labs are reviewed and discussed with patient.   Stable hemoglobin and kidney function. Drug-induced transaminitis has improved. Clinically she tolerates crizotinib for lung cancer treatment. Tolerating Arimidex for treatment of breast cancer. Continue both regimen. Plan repeating image after 3 months of treatment.  #High fall risk, status post fall, lower back pain and hip pain. Recent history of pelvic fracture I will obtain lumbar spine x-ray and pelvic x-ray. She has history of osteopenia, bone density was done in 2019. Being on aromatase inhibitor may potentially worsen her bone density. Recommend patient to take calcium 1000 mg as well as vitamin D 1000 unit daily. I will check vitamin D level at next visit.  We will need to discuss about bisphosphonate treatment.  Patient will need to obtain dental clearance.  Due to the COVID 19 pandemic, most dental office are closed.  Follow Up Instructions: Lab MD assessment on 09/28/2018.  I discussed the assessment and treatment plan with the patient. The patient was provided an opportunity to ask questions and all were answered. The patient agreed with the plan and demonstrated an understanding of the  instructions.  The patient was advised to call back or seek an in-person evaluation if the symptoms worsen or if the condition fails to improve as anticipated.  Orders Placed This Encounter  Procedures  . DG Pelvis 1-2 Views    Standing Status:   Future    Standing Expiration Date:   11/07/2019    Order Specific Question:   Reason for Exam (SYMPTOM  OR DIAGNOSIS REQUIRED)    Answer:   Lower back, hip pain due to fall    Order Specific Question:   Preferred imaging location?    Answer:   [facility]    Order Specific Question:   Call Results- Best Contact Number?    Answer:   [phone]    Order Specific Question:   Radiology Contrast Protocol - do NOT remove file path    Answer:   \\[email or web address]s.pdf  . DG Lumbar Spine Complete    Standing  Status:   Future    Standing Expiration Date:   09/07/2019    Order Specific Question:   Reason for Exam (SYMPTOM  OR DIAGNOSIS REQUIRED)    Answer:   lower back pain from fall    Order Specific Question:   Preferred imaging location?    Answer:   [facility]    Order Specific Question:   Call Results- Best Contact Number?    Answer:   [phone]    Order Specific Question:   Radiology Contrast Protocol - do NOT remove file path    Answer:   \\[email or web address]s.pdf  . DG Bone Density    Standing Status:   Future    Standing Expiration Date:   09/07/2019    Order Specific Question:   Reason for Exam (SYMPTOM  OR DIAGNOSIS REQUIRED)    Answer:   history of breast cancer / use of aromatase inhibitor    Order Specific Question:   Preferred imaging location?    Answer:   [facility]    .orders  Earlie Server, MD 09/07/2018 9:04 PM

## 2018-09-08 ENCOUNTER — Ambulatory Visit
Admission: RE | Admit: 2018-09-08 | Discharge: 2018-09-08 | Disposition: A | Discharge status: Home / Self Care | Payer: Medicare Other | Source: Ambulatory Visit | Attending: Oncology | Admitting: Oncology

## 2018-09-08 ENCOUNTER — Telehealth: Payer: Self-pay | Admitting: *Deleted

## 2018-09-08 ENCOUNTER — Other Ambulatory Visit: Payer: Self-pay

## 2018-09-08 DIAGNOSIS — M6281 Muscle weakness (generalized): Secondary | ICD-10-CM | POA: Diagnosis not present

## 2018-09-08 DIAGNOSIS — Z741 Need for assistance with personal care: Secondary | ICD-10-CM | POA: Diagnosis not present

## 2018-09-08 DIAGNOSIS — M25551 Pain in right hip: Secondary | ICD-10-CM | POA: Insufficient documentation

## 2018-09-08 DIAGNOSIS — R4189 Other symptoms and signs involving cognitive functions and awareness: Secondary | ICD-10-CM | POA: Diagnosis not present

## 2018-09-08 DIAGNOSIS — C3490 Malignant neoplasm of unspecified part of unspecified bronchus or lung: Secondary | ICD-10-CM | POA: Diagnosis not present

## 2018-09-08 DIAGNOSIS — M5136 Other intervertebral disc degeneration, lumbar region: Secondary | ICD-10-CM | POA: Diagnosis not present

## 2018-09-08 DIAGNOSIS — R2689 Other abnormalities of gait and mobility: Secondary | ICD-10-CM | POA: Diagnosis not present

## 2018-09-08 DIAGNOSIS — S32591A Other specified fracture of right pubis, initial encounter for closed fracture: Secondary | ICD-10-CM | POA: Diagnosis not present

## 2018-09-08 DIAGNOSIS — R278 Other lack of coordination: Secondary | ICD-10-CM | POA: Diagnosis not present

## 2018-09-08 NOTE — Telephone Encounter (Signed)
She had a WebEx visit yesterday.

## 2018-09-08 NOTE — Telephone Encounter (Signed)
I am not sure. I have not heard that they were not doing xrays.

## 2018-09-08 NOTE — Telephone Encounter (Signed)
Patient called and states that she had an email from Dr Tasia Catchings but it was expired. Please return her call [phone]

## 2018-09-10 DIAGNOSIS — Z741 Need for assistance with personal care: Secondary | ICD-10-CM | POA: Diagnosis not present

## 2018-09-10 DIAGNOSIS — R2689 Other abnormalities of gait and mobility: Secondary | ICD-10-CM | POA: Diagnosis not present

## 2018-09-10 DIAGNOSIS — R4189 Other symptoms and signs involving cognitive functions and awareness: Secondary | ICD-10-CM | POA: Diagnosis not present

## 2018-09-10 DIAGNOSIS — C3490 Malignant neoplasm of unspecified part of unspecified bronchus or lung: Secondary | ICD-10-CM | POA: Diagnosis not present

## 2018-09-10 DIAGNOSIS — M6281 Muscle weakness (generalized): Secondary | ICD-10-CM | POA: Diagnosis not present

## 2018-09-10 DIAGNOSIS — R278 Other lack of coordination: Secondary | ICD-10-CM | POA: Diagnosis not present

## 2018-09-13 DIAGNOSIS — R2689 Other abnormalities of gait and mobility: Secondary | ICD-10-CM | POA: Diagnosis not present

## 2018-09-13 DIAGNOSIS — M6281 Muscle weakness (generalized): Secondary | ICD-10-CM | POA: Diagnosis not present

## 2018-09-13 DIAGNOSIS — C3490 Malignant neoplasm of unspecified part of unspecified bronchus or lung: Secondary | ICD-10-CM | POA: Diagnosis not present

## 2018-09-13 DIAGNOSIS — Z741 Need for assistance with personal care: Secondary | ICD-10-CM | POA: Diagnosis not present

## 2018-09-13 DIAGNOSIS — R4189 Other symptoms and signs involving cognitive functions and awareness: Secondary | ICD-10-CM | POA: Diagnosis not present

## 2018-09-13 DIAGNOSIS — R278 Other lack of coordination: Secondary | ICD-10-CM | POA: Diagnosis not present

## 2018-09-14 DIAGNOSIS — R2689 Other abnormalities of gait and mobility: Secondary | ICD-10-CM | POA: Diagnosis not present

## 2018-09-14 DIAGNOSIS — Z741 Need for assistance with personal care: Secondary | ICD-10-CM | POA: Diagnosis not present

## 2018-09-14 DIAGNOSIS — M6281 Muscle weakness (generalized): Secondary | ICD-10-CM | POA: Diagnosis not present

## 2018-09-14 DIAGNOSIS — R4189 Other symptoms and signs involving cognitive functions and awareness: Secondary | ICD-10-CM | POA: Diagnosis not present

## 2018-09-14 DIAGNOSIS — R278 Other lack of coordination: Secondary | ICD-10-CM | POA: Diagnosis not present

## 2018-09-14 DIAGNOSIS — C3490 Malignant neoplasm of unspecified part of unspecified bronchus or lung: Secondary | ICD-10-CM | POA: Diagnosis not present

## 2018-09-15 DIAGNOSIS — R2689 Other abnormalities of gait and mobility: Secondary | ICD-10-CM | POA: Diagnosis not present

## 2018-09-15 DIAGNOSIS — M6281 Muscle weakness (generalized): Secondary | ICD-10-CM | POA: Diagnosis not present

## 2018-09-15 DIAGNOSIS — R4189 Other symptoms and signs involving cognitive functions and awareness: Secondary | ICD-10-CM | POA: Diagnosis not present

## 2018-09-15 DIAGNOSIS — R278 Other lack of coordination: Secondary | ICD-10-CM | POA: Diagnosis not present

## 2018-09-15 DIAGNOSIS — C3490 Malignant neoplasm of unspecified part of unspecified bronchus or lung: Secondary | ICD-10-CM | POA: Diagnosis not present

## 2018-09-15 DIAGNOSIS — Z741 Need for assistance with personal care: Secondary | ICD-10-CM | POA: Diagnosis not present

## 2018-09-17 DIAGNOSIS — R2689 Other abnormalities of gait and mobility: Secondary | ICD-10-CM | POA: Diagnosis not present

## 2018-09-17 DIAGNOSIS — C3490 Malignant neoplasm of unspecified part of unspecified bronchus or lung: Secondary | ICD-10-CM | POA: Diagnosis not present

## 2018-09-17 DIAGNOSIS — Z741 Need for assistance with personal care: Secondary | ICD-10-CM | POA: Diagnosis not present

## 2018-09-17 DIAGNOSIS — R4189 Other symptoms and signs involving cognitive functions and awareness: Secondary | ICD-10-CM | POA: Diagnosis not present

## 2018-09-17 DIAGNOSIS — M6281 Muscle weakness (generalized): Secondary | ICD-10-CM | POA: Diagnosis not present

## 2018-09-17 DIAGNOSIS — R278 Other lack of coordination: Secondary | ICD-10-CM | POA: Diagnosis not present

## 2018-09-20 ENCOUNTER — Ambulatory Visit (INDEPENDENT_AMBULATORY_CARE_PROVIDER_SITE_OTHER): Payer: Medicare Other | Admitting: Family Medicine

## 2018-09-20 ENCOUNTER — Other Ambulatory Visit: Payer: Self-pay

## 2018-09-20 ENCOUNTER — Encounter: Payer: Self-pay | Admitting: Family Medicine

## 2018-09-20 ENCOUNTER — Telehealth: Payer: Self-pay

## 2018-09-20 ENCOUNTER — Ambulatory Visit
Admission: RE | Admit: 2018-09-20 | Discharge: 2018-09-20 | Disposition: A | Discharge status: Home / Self Care | Payer: Medicare Other | Source: Ambulatory Visit | Attending: Family Medicine | Admitting: Family Medicine

## 2018-09-20 VITALS — BP 108/64 | HR 60 | Temp 97.7°F | Ht 63.0 in | Wt 143.0 lb

## 2018-09-20 DIAGNOSIS — S32599K Other specified fracture of unspecified pubis, subsequent encounter for fracture with nonunion: Secondary | ICD-10-CM | POA: Diagnosis not present

## 2018-09-20 DIAGNOSIS — R2241 Localized swelling, mass and lump, right lower limb: Secondary | ICD-10-CM | POA: Insufficient documentation

## 2018-09-20 DIAGNOSIS — C50912 Malignant neoplasm of unspecified site of left female breast: Secondary | ICD-10-CM

## 2018-09-20 DIAGNOSIS — R2689 Other abnormalities of gait and mobility: Secondary | ICD-10-CM | POA: Diagnosis not present

## 2018-09-20 DIAGNOSIS — R278 Other lack of coordination: Secondary | ICD-10-CM | POA: Diagnosis not present

## 2018-09-20 DIAGNOSIS — S32509K Unspecified fracture of unspecified pubis, subsequent encounter for fracture with nonunion: Secondary | ICD-10-CM

## 2018-09-20 DIAGNOSIS — S32509A Unspecified fracture of unspecified pubis, initial encounter for closed fracture: Secondary | ICD-10-CM | POA: Insufficient documentation

## 2018-09-20 DIAGNOSIS — C3490 Malignant neoplasm of unspecified part of unspecified bronchus or lung: Secondary | ICD-10-CM

## 2018-09-20 DIAGNOSIS — R4189 Other symptoms and signs involving cognitive functions and awareness: Secondary | ICD-10-CM | POA: Diagnosis not present

## 2018-09-20 DIAGNOSIS — Z741 Need for assistance with personal care: Secondary | ICD-10-CM | POA: Diagnosis not present

## 2018-09-20 DIAGNOSIS — M6281 Muscle weakness (generalized): Secondary | ICD-10-CM | POA: Diagnosis not present

## 2018-09-20 NOTE — Telephone Encounter (Signed)
Left message on both numbers for patient to call back.

## 2018-09-20 NOTE — Assessment & Plan Note (Signed)
Currently in therapy for this.

## 2018-09-20 NOTE — Assessment & Plan Note (Signed)
Currently in therapy for this

## 2018-09-20 NOTE — Assessment & Plan Note (Signed)
Pt saw orthopedics a couple of weeks ago, but noted on our visit that she had not seen anyone for this. Is in PT. Does not feels she improving. Will see if Orthopedic office can reach out to reassure about plan and expected course. Their plan was f/u in 6 weeks. She did sustain another fall

## 2018-09-20 NOTE — Assessment & Plan Note (Signed)
Highly concerning for DVT given cancer treatment and recent pelvic fracture. Stat US to r/o DVT. Anticipate reaching out to oncologist if positive.

## 2018-09-20 NOTE — Progress Notes (Signed)
Subjective:     Jamie Matthews is a 78 y.o. female presenting for transfer of care (from Dr. Deborra Medina); Leg Swelling (right); and Hip Pain (bilateral. Uses a walker to help ambulate)     HPI   #Right leg Swelling - started noticing it before the fall  - no ankle pain  - did fracture the hip, but no other injuries - no calf  - no sob, breathing difficulty, fast heart rate  #Bilateral Hip pain - pelvic fracture with mildly displaced fractures of the right superior and inferior pubic rami - initial fracture on 08/23/2018 - sustained another fall with repeat imaging on 09/08/2018 - has been ordered by the cancer doctor - has been using a walker since the pelvis fracture - is having a OT and PT come and they recommended - no improvement with time - worse on the left side - and the back of the on the right side - since the fall and fracture   #Lung Cancer - is on oral treatment which she started 3 weeks ago  - was going through a lot of testing to determine specific - weakness and being tired, low appetite are the main things - is grazing   #Breast cancer - taking arimidex - diagnosed a few months ago  Review of Systems  Respiratory: Negative for shortness of breath.   Cardiovascular: Positive for leg swelling. Negative for chest pain and palpitations.  Musculoskeletal:       Hip pain     Social History   Tobacco Use  Smoking Status Never Smoker  Smokeless Tobacco Never Used        Objective:   BP Readings from Last 3 Encounters:  09/20/18 108/64  08/23/18 (!) 111/59  08/20/18 112/68   Wt Readings from Last 3 Encounters:  09/20/18 143 lb (64.9 kg)  08/23/18 157 lb 1.6 oz (71.3 kg)  08/09/18 148 lb 12.8 oz (67.5 kg)    BP 108/64   Pulse 60   Temp 97.7 F (36.5 C)   Ht 5\' 3"  (1.6 m) Comment: per patient  Wt 143 lb (64.9 kg) Comment: per patient  BMI 25.33 kg/m    Physical Exam Constitutional:      Appearance: Normal appearance. She  is not ill-appearing.  HENT:     Right Ear: External ear normal.     Left Ear: External ear normal.     Nose: Nose normal.  Eyes:     Conjunctiva/sclera: Conjunctivae normal.  Cardiovascular:     Rate and Rhythm: Normal rate and regular rhythm.     Heart sounds: Murmur present.  Pulmonary:     Effort: Pulmonary effort is normal. No respiratory distress.     Breath sounds: No wheezing.  Musculoskeletal:     Comments: 2+ pitting edema of the right leg. Trace edema of the left leg. No calf TTP  Skin:    General: Skin is warm and dry.     Findings: No erythema.  Neurological:     Mental Status: She is alert.  Psychiatric:        Mood and Affect: Mood normal.        Thought Content: Thought content normal.      DG Lumbar Spine Complete CLINICAL DATA:  78 year old female with fall  EXAM: LUMBAR SPINE - COMPLETE 4+ VIEW  COMPARISON:  None.  FINDINGS: Lumbar Spine:  Lumbar vertebral elements maintain normal alignment without evidence of anterolisthesis, retrolisthesis, subluxation.  No acute fracture line identified.  Vertebral body heights maintained.  Disc space narrowing throughout the lumbar spine which is worst at the L3-L4, L4-L5, L5-S1 levels. Associated endplate changes.  .  Oblique images demonstrate no displaced pars defect.  Cholecystectomy  IMPRESSION: Negative for acute fracture or malalignment of the lumbar spine.  Degenerative disc disease.  Electronically Signed   By: Corrie Mckusick D.O.   On: 09/09/2018 08:22 DG Pelvis 1-2 Views CLINICAL DATA:  78 year old female with pelvic fracture sustained 1 month previously. Unfortunately, she fell again 2 weeks ago and has persistent right pelvic and hip pain as well as lumbar spine pain.  EXAM: PELVIS - 1-2 VIEW  COMPARISON:  Prior pelvic and right hip radiographs 08/23/2018  FINDINGS: Acute mildly displaced fractures of the right superior and inferior pubic rami are again identified. The fracture  lines are slightly more visible on today's evaluation likely representing underlying osteo lysis which can be a part of healing. No definite periosteal reaction or bridging callus is identified. The superior pubic ramus fracture may be incrementally more displaced than seen on the prior study. The right proximal femur remains intact. The remainder of the bony pelvis remains intact.  IMPRESSION: 1. Similar appearance of mildly displaced fractures through the right superior and inferior pubic rami with perhaps slightly increased displacement of the superior pubic ramus fracture compared to 08/23/2018. 2. No definite periosteal reaction or bridging callus identified. 3. No new acute fracture or malalignment.  Electronically Signed   By: Jacqulynn Cadet M.D.   On: 09/09/2018 08:21        Assessment & Plan:   Problem List Items Addressed This Visit      Respiratory   Primary adenocarcinoma of lung ([place])    Currently in therapy for this.         Musculoskeletal and Integument   Closed fracture pubis with nonunion    Pt saw orthopedics a couple of weeks ago, but noted on our visit that she had not seen anyone for this. Is in PT. Does not feels she improving. Will see if Orthopedic office can reach out to reassure about plan and expected course. Their plan was f/u in 6 weeks. She did sustain another fall      Relevant Orders   Ambulatory referral to Orthopedic Surgery     Other   Malignant neoplasm of left female breast [facility])    Currently in therapy for this      Localized swelling of right lower leg - Primary    Highly concerning for DVT given cancer treatment and recent pelvic fracture. Stat US to r/o DVT. Anticipate reaching out to oncologist if positive.       Relevant Orders   US Venous Img Lower Unilateral Right       Return if symptoms worsen or fail to improve.  [name], MD

## 2018-09-20 NOTE — Patient Instructions (Signed)
#  Leg swelling - I think we should make sure this is not a blood clot  #Hip pain - referral to orthopedic doctors to make sure we are doing everything. Fractures can take 8 weeks to heal.

## 2018-09-21 ENCOUNTER — Other Ambulatory Visit: Payer: Self-pay | Admitting: Internal Medicine

## 2018-09-22 DIAGNOSIS — M6281 Muscle weakness (generalized): Secondary | ICD-10-CM | POA: Diagnosis not present

## 2018-09-22 DIAGNOSIS — R2689 Other abnormalities of gait and mobility: Secondary | ICD-10-CM | POA: Diagnosis not present

## 2018-09-22 DIAGNOSIS — R278 Other lack of coordination: Secondary | ICD-10-CM | POA: Diagnosis not present

## 2018-09-22 DIAGNOSIS — C3490 Malignant neoplasm of unspecified part of unspecified bronchus or lung: Secondary | ICD-10-CM | POA: Diagnosis not present

## 2018-09-22 DIAGNOSIS — Z741 Need for assistance with personal care: Secondary | ICD-10-CM | POA: Diagnosis not present

## 2018-09-22 DIAGNOSIS — R4189 Other symptoms and signs involving cognitive functions and awareness: Secondary | ICD-10-CM | POA: Diagnosis not present

## 2018-09-23 ENCOUNTER — Encounter: Payer: Self-pay | Admitting: *Deleted

## 2018-09-27 ENCOUNTER — Inpatient Hospital Stay: Payer: Medicare Other | Attending: Oncology | Admitting: *Deleted

## 2018-09-27 ENCOUNTER — Other Ambulatory Visit: Payer: Self-pay

## 2018-09-27 DIAGNOSIS — S32509S Unspecified fracture of unspecified pubis, sequela: Secondary | ICD-10-CM

## 2018-09-27 DIAGNOSIS — Z803 Family history of malignant neoplasm of breast: Secondary | ICD-10-CM | POA: Diagnosis not present

## 2018-09-27 DIAGNOSIS — N179 Acute kidney failure, unspecified: Secondary | ICD-10-CM | POA: Insufficient documentation

## 2018-09-27 DIAGNOSIS — C50912 Malignant neoplasm of unspecified site of left female breast: Secondary | ICD-10-CM

## 2018-09-27 DIAGNOSIS — R600 Localized edema: Secondary | ICD-10-CM | POA: Diagnosis not present

## 2018-09-27 DIAGNOSIS — Z170 Estrogen receptor positive status [ER+]: Secondary | ICD-10-CM | POA: Diagnosis not present

## 2018-09-27 DIAGNOSIS — Z79899 Other long term (current) drug therapy: Secondary | ICD-10-CM | POA: Insufficient documentation

## 2018-09-27 DIAGNOSIS — E785 Hyperlipidemia, unspecified: Secondary | ICD-10-CM | POA: Diagnosis not present

## 2018-09-27 DIAGNOSIS — M8580 Other specified disorders of bone density and structure, unspecified site: Secondary | ICD-10-CM | POA: Diagnosis not present

## 2018-09-27 DIAGNOSIS — C3412 Malignant neoplasm of upper lobe, left bronchus or lung: Secondary | ICD-10-CM | POA: Insufficient documentation

## 2018-09-27 DIAGNOSIS — C3490 Malignant neoplasm of unspecified part of unspecified bronchus or lung: Secondary | ICD-10-CM

## 2018-09-27 DIAGNOSIS — Z7189 Other specified counseling: Secondary | ICD-10-CM

## 2018-09-27 DIAGNOSIS — Z8249 Family history of ischemic heart disease and other diseases of the circulatory system: Secondary | ICD-10-CM | POA: Insufficient documentation

## 2018-09-27 DIAGNOSIS — R102 Pelvic and perineal pain: Secondary | ICD-10-CM | POA: Insufficient documentation

## 2018-09-27 DIAGNOSIS — Z79811 Long term (current) use of aromatase inhibitors: Secondary | ICD-10-CM | POA: Diagnosis not present

## 2018-09-27 DIAGNOSIS — Z9181 History of falling: Secondary | ICD-10-CM | POA: Diagnosis not present

## 2018-09-27 DIAGNOSIS — R7989 Other specified abnormal findings of blood chemistry: Secondary | ICD-10-CM

## 2018-09-27 LAB — CBC WITH DIFFERENTIAL/PLATELET
Abs Immature Granulocytes: 0.03 10*3/uL (ref 0.00–0.07)
Basophils Absolute: 0 10*3/uL (ref 0.0–0.1)
Basophils Relative: 1 %
Eosinophils Absolute: 0.4 10*3/uL (ref 0.0–0.5)
Eosinophils Relative: 8 %
HCT: 33 % — ABNORMAL LOW (ref 36.0–46.0)
Hemoglobin: 10.5 g/dL — ABNORMAL LOW (ref 12.0–15.0)
Immature Granulocytes: 1 %
Lymphocytes Relative: 23 %
Lymphs Abs: 1.3 10*3/uL (ref 0.7–4.0)
MCH: 31.5 pg (ref 26.0–34.0)
MCHC: 31.8 g/dL (ref 30.0–36.0)
MCV: 99.1 fL (ref 80.0–100.0)
Monocytes Absolute: 1.3 10*3/uL — ABNORMAL HIGH (ref 0.1–1.0)
Monocytes Relative: 22 %
Neutro Abs: 2.8 10*3/uL (ref 1.7–7.7)
Neutrophils Relative %: 45 %
Platelets: 322 10*3/uL (ref 150–400)
RBC: 3.33 MIL/uL — ABNORMAL LOW (ref 3.87–5.11)
RDW: 14.1 % (ref 11.5–15.5)
WBC: 5.9 10*3/uL (ref 4.0–10.5)
nRBC: 0 % (ref 0.0–0.2)

## 2018-09-27 LAB — COMPREHENSIVE METABOLIC PANEL
ALT: 31 U/L (ref 0–44)
AST: 24 U/L (ref 15–41)
Albumin: 3.1 g/dL — ABNORMAL LOW (ref 3.5–5.0)
Alkaline Phosphatase: 184 U/L — ABNORMAL HIGH (ref 38–126)
Anion gap: 6 (ref 5–15)
BUN: 23 mg/dL (ref 8–23)
CO2: 28 mmol/L (ref 22–32)
Calcium: 8.1 mg/dL — ABNORMAL LOW (ref 8.9–10.3)
Chloride: 106 mmol/L (ref 98–111)
Creatinine, Ser: 0.99 mg/dL (ref 0.44–1.00)
GFR calc Af Amer: >60 mL/min (ref >60)
GFR calc non Af Amer: 54 mL/min — ABNORMAL LOW (ref >60)
Glucose, Bld: 120 mg/dL — ABNORMAL HIGH (ref 70–99)
Potassium: 4.8 mmol/L (ref 3.5–5.1)
Sodium: 140 mmol/L (ref 135–145)
Total Bilirubin: 0.4 mg/dL (ref 0.3–1.2)
Total Protein: 6 g/dL — ABNORMAL LOW (ref 6.5–8.1)

## 2018-09-28 ENCOUNTER — Other Ambulatory Visit: Payer: Self-pay

## 2018-09-28 ENCOUNTER — Encounter: Payer: Self-pay | Admitting: Oncology

## 2018-09-28 ENCOUNTER — Inpatient Hospital Stay (HOSPITAL_BASED_OUTPATIENT_CLINIC_OR_DEPARTMENT_OTHER): Payer: Medicare Other | Admitting: Oncology

## 2018-09-28 ENCOUNTER — Inpatient Hospital Stay: Payer: Medicare Other

## 2018-09-28 DIAGNOSIS — C50912 Malignant neoplasm of unspecified site of left female breast: Secondary | ICD-10-CM

## 2018-09-28 DIAGNOSIS — M7989 Other specified soft tissue disorders: Secondary | ICD-10-CM

## 2018-09-28 DIAGNOSIS — C3490 Malignant neoplasm of unspecified part of unspecified bronchus or lung: Secondary | ICD-10-CM

## 2018-09-28 DIAGNOSIS — Z9181 History of falling: Secondary | ICD-10-CM | POA: Diagnosis not present

## 2018-09-28 DIAGNOSIS — R011 Cardiac murmur, unspecified: Secondary | ICD-10-CM

## 2018-09-28 DIAGNOSIS — M8580 Other specified disorders of bone density and structure, unspecified site: Secondary | ICD-10-CM

## 2018-09-28 DIAGNOSIS — Z79811 Long term (current) use of aromatase inhibitors: Secondary | ICD-10-CM | POA: Diagnosis not present

## 2018-09-28 DIAGNOSIS — S32509S Unspecified fracture of unspecified pubis, sequela: Secondary | ICD-10-CM

## 2018-09-28 LAB — VITAMIN D 25 HYDROXY (VIT D DEFICIENCY, FRACTURES): Vit D, 25-Hydroxy: 36.8 ng/mL (ref 30.0–100.0)

## 2018-09-28 NOTE — Progress Notes (Signed)
HEMATOLOGY-ONCOLOGY TeleHEALTH VISIT PROGRESS NOTE  I connected with Jamie Matthews on 09/28/18 at  9:15 AM EDT by video enabled telemedicine visit and verified that I am speaking with the correct person using two identifiers. I discussed the limitations, risks, security and privacy concerns of performing an evaluation and management service by telemedicine and the availability of in-person appointments. I also discussed with the patient that there may be a patient responsible charge related to this service. The patient expressed understanding and agreed to proceed.   Other persons participating in the visit and their role in the encounter:  Geraldine Solar, CMA, check in patient   Patient's location: Home  Provider's location: Work                                                                              Chief Complaint: Follow-up for chemotherapy treat ment for breast cancer and lung    .                                                                                                                                                                                                                                                                                                                                                                                        INTERVAL HISTORY Jamie Matthews is a 78 y.o. female who has above history reviewed  by me today presents for follow up visit for management of breast cancer and metastatic lung adenocarcinoma.  Problems and complaints are listed below:  Patient has been on Crizotinib since March 2020, 14m BID, overall feels tolerating well.  She had 2 fall episode. Had pelvic fracture. Reports pain is getting better.  Takes arimidex 193mdaily, tolerating well.  Denies fever, chills, nausea, vomiting, diarrhea, chest pain, abdominal pain  Had leg swelling, bilateral, no aggravating or alleviating  factors. 09/20/2018 [place] unilateral right, negative for DVT.  Review of Systems  Constitutional: Positive for fatigue. Negative for appetite change, chills and fever.  HENT:   Negative for hearing loss and voice change.   Eyes: Negative for eye problems.  Respiratory: Negative for chest tightness and cough.   Cardiovascular: Negative for chest pain.  Gastrointestinal: Negative for abdominal distention, abdominal pain and blood in stool.  Endocrine: Negative for hot flashes.  Genitourinary: Negative for difficulty urinating and frequency.   Musculoskeletal:       Hip pain getting better.   Skin: Negative for itching and rash.  Neurological: Negative for extremity weakness.  Hematological: Negative for adenopathy.  Psychiatric/Behavioral: Negative for confusion.    Past Medical History:  Diagnosis Date  . AKI (acute kidney injury) (HCCache3/24/2020  . Anxiety   . Arthritis   . Belching   . Bladder disorder    OVERACTIVE  . Bowel dysfunction    BLOCKAGE  . Cancer (HCC)    breast  . Constipation   . Depression   . Diverticulitis   . Fibromyalgia   . GERD (gastroesophageal reflux disease)   . Hyperlipidemia   . IBS (irritable bowel syndrome)   . Internal hemorrhoids   . Memory deficits   . Murmur    asymptomatic  . Pneumonia 11/18/12  . Urinary incontinence   . Vertigo    Past Surgical History:  Procedure Laterality Date  . AXILLARY LYMPH NODE BIOPSY Left 06/08/2018   INVASIVE MAMMARY CARCINOMA  . BLADDER SUSPENSION  2004, 2012  . BREAST BIOPSY Left 06/08/2018   INVASIVE MAMMARY CARCINOMA  . CATARACT EXTRACTION W/PHACO Right 08/27/2015   Procedure: CATARACT EXTRACTION PHACO AND INTRAOCULAR LENS PLACEMENT (IOC);  Surgeon: StEstill CottaMD;  Location: [facility] OR[facility];  Service: Ophthalmology;  Laterality: Right;  [place] 1:00.2 AP%  22.5 CDE  23.67 fluid casette lot #[id number]  exp05/31/2018  . CATARACT EXTRACTION W/PHACO Left 10/15/2015   Procedure: CATARACT EXTRACTION  PHACO AND INTRAOCULAR LENS PLACEMENT (IOC);  Surgeon: StEstill CottaMD;  Location: [facility];  Service: Ophthalmology;  Laterality: Left;  [place]:07 AP% 18.1 CDE 21.57 fluid pack lot # [id number]  . CHOLECYSTECTOMY    . COLONOSCOPY  2017  . ELECTROMAGNETIC NAVIGATION BROCHOSCOPY N/A 07/09/2018   Procedure: ELECTROMAGNETIC NAVIGATION BRONCHOSCOPY;  Surgeon: KaFlora LippsMD;  Location: [facility];  Service: Cardiopulmonary;  Laterality: N/A;  . TONSILLECTOMY  1947    Family History  Problem Relation Age of Onset  . Diabetes Father   . Heart disease Father   . Lymphoma Father   . Heart disease Mother   . Breast cancer Sister [id number]. Colon cancer Neg Hx   . Esophageal cancer Neg Hx   . Rectal cancer Neg Hx   . Stomach cancer Neg Hx   . Bladder Cancer Neg Hx   . Kidney cancer Neg Hx     Social History   Socioeconomic History  . Marital status: Married    Spouse name: PiBennye Alm.  Number of children: 0  . Years of education: Master's Degree  . Highest education level: Not on file  Occupational History  . Occupation: Retired  Scientific laboratory technician  . Financial resource strain: Not hard at all  . Food insecurity:    Worry: Not on file    Inability: Not on file  . Transportation needs:    Medical: Not on file    Non-medical: Not on file  Tobacco Use  . Smoking status: Never Smoker  . Smokeless tobacco: Never Used  Substance and Sexual Activity  . Alcohol use: Yes    Comment: rare wine  . Drug use: No  . Sexual activity: Not Currently  Lifestyle  . Physical activity:    Days per week: Not on file    Minutes per session: Not on file  . Stress: Not on file  Relationships  . Social connections:    Talks on phone: Not on file    Gets together: Not on file    Attends religious service: Not on file    Active member of club or organization: Not on file    Attends meetings of clubs or organizations: Not on file    Relationship status: Not on file  . Intimate partner violence:    Fear  of current or ex partner: Not on file    Emotionally abused: Not on file    Physically abused: Not on file    Forced sexual activity: Not on file  Other Topics Concern  . Not on file  Social History Narrative   Recently moved with her husband to [place] from [place].   Husband is a retired Pharmacist, community.   No children.   She is a retired Pharmacist, hospital.   Enjoys: Chief Technology Officer, reading - mysteries and biographies, cooking   Exercise: walking, gardening   Diet: low appetite due to cancer treatment, grazing   She is a DNR.    Current Outpatient Medications on File Prior to Visit  Medication Sig Dispense Refill  . acetaminophen (TYLENOL) 325 MG tablet Take 325 mg by mouth every 6 (six) hours as needed (for pain).    [name] anastrozole (ARIMIDEX) 1 MG tablet Take 1 tablet (1 mg total) by mouth daily. 30 tablet 3  . atorvastatin (LIPITOR) 20 MG tablet TAKE 1 TABLET DAILY (Patient taking differently: Take 20 mg by mouth at bedtime. ) 90 tablet 3  . buPROPion (WELLBUTRIN XL) 150 MG 24 hr tablet TAKE ONE TABLET BY MOUTH EVERY DAY (Patient taking differently: Take 150 mg by mouth every morning. ) 90 tablet 2  . cyclobenzaprine (FLEXERIL) 5 MG tablet Take 1 tablet (5 mg total) by mouth 3 (three) times daily as needed for muscle spasms. 30 tablet 0  . desonide (DESOWEN) 0.05 % lotion Apply 1 application topically as needed. After showering    . DEXILANT 60 MG capsule TAKE 1 CAPSULE BY MOUTH ONCE DAILY 90 capsule 0  . dextromethorphan-guaiFENesin (MUCINEX DM) 30-600 MG 12hr tablet Take 1 tablet by mouth 2 (two) times daily as needed (congestion/cold).     . diphenhydrAMINE (BENADRYL) 25 MG tablet Take 25 mg by mouth every 8 (eight) hours as needed (allergies).     . famotidine (PEPCID) 20 MG tablet Take 20 mg by mouth 2 (two) times daily.    [name] ketoconazole (NIZORAL) 2 % shampoo Apply 1 application topically once a week.     . lubiprostone (AMITIZA) 24 MCG capsule TAKE ONE CAPSULE TWICE A DAY WITH MEALS (Patient  taking  differently: Take 24 mcg by mouth 2 (two) times daily. ) 60 capsule 3  . memantine (NAMENDA) 10 MG tablet TAKE 1 TABLET BY MOUTH TWICE DAILY (Patient taking differently: Take 10 mg by mouth 2 (two) times daily. ) 180 tablet 2  . Multiple Vitamin (MULTIVITAMIN WITH MINERALS) TABS tablet Take 1 tablet by mouth daily.    [name] MYRBETRIQ 50 MG TB24 tablet TAKE 1 TABLET BY MOUTH DAILY (Patient taking differently: Take 50 mg by mouth every morning. ) 90 tablet 1  . polyethylene glycol (MIRALAX / GLYCOLAX) packet Take 17 g by mouth daily.    . solifenacin (VESICARE) 5 MG tablet Take 1 tablet (5 mg total) by mouth daily. 30 tablet 3  . triamcinolone (NASACORT) 55 MCG/ACT AERO nasal inhaler Place 1 spray into the nose as needed.     . venlafaxine XR (EFFEXOR-XR) 150 MG 24 hr capsule TAKE 1 CAPSULE BY MOUTH DAILY (Patient taking differently: Take 150 mg by mouth daily with breakfast. ) 90 capsule 1  . XALKORI 250 MG capsule TAKE 1 CAPSULE (250 MG TOTAL) BY MOUTH 2 (TWO) TIMES DAILY. 60 capsule 0   No current facility-administered medications on file prior to visit.     Allergies  Allergen Reactions  . Sulfa Antibiotics Rash       Observations/Objective: There were no vitals filed for this visit. There is no height or weight on file to calculate BMI.  Physical Exam  Constitutional: She is oriented to person, place, and time. No distress.  HENT:  Head: Normocephalic and atraumatic.  Pulmonary/Chest: Effort normal.  Neurological: She is alert and oriented to person, place, and time.  Psychiatric: Affect normal.    CBC    Component Value Date/Time   WBC 5.9 09/27/2018 1407   RBC 3.33 (L) 09/27/2018 1407   HGB 10.5 (L) 09/27/2018 1407   HGB 12.6 06/12/2014 0518   HCT 33.0 (L) 09/27/2018 1407   HCT 37.9 06/12/2014 0518   PLT 322 09/27/2018 1407   PLT 204 06/12/2014 0518   MCV 99.1 09/27/2018 1407   MCV 96 06/12/2014 0518   MCH 31.5 09/27/2018 1407   MCHC 31.8 09/27/2018 1407   RDW  14.1 09/27/2018 1407   RDW 13.0 06/12/2014 0518   LYMPHSABS 1.3 09/27/2018 1407   LYMPHSABS 0.6 (L) 06/12/2014 0518   MONOABS 1.3 (H) 09/27/2018 1407   MONOABS 1.0 (H) 06/12/2014 0518   EOSABS 0.4 09/27/2018 1407   EOSABS 0.1 06/12/2014 0518   BASOSABS 0.0 09/27/2018 1407   BASOSABS 0.0 06/12/2014 0518    CMP     Component Value Date/Time   NA 140 09/27/2018 1407   NA 139 06/12/2014 0518   K 4.8 09/27/2018 1407   K 4.4 06/12/2014 0518   CL 106 09/27/2018 1407   CL 104 06/12/2014 0518   CO2 28 09/27/2018 1407   CO2 26 06/12/2014 0518   GLUCOSE 120 (H) 09/27/2018 1407   GLUCOSE 93 06/12/2014 0518   BUN 23 09/27/2018 1407   BUN 17 06/12/2014 0518   CREATININE 0.99 09/27/2018 1407   CREATININE 0.80 06/12/2014 0518   CALCIUM 8.1 (L) 09/27/2018 1407   CALCIUM 8.2 (L) 06/12/2014 0518   PROT 6.0 (L) 09/27/2018 1407   PROT 7.6 06/10/2014 1649   ALBUMIN 3.1 (L) 09/27/2018 1407   ALBUMIN 4.1 06/10/2014 1649   AST 24 09/27/2018 1407   AST 32 06/10/2014 1649   ALT 31 09/27/2018 1407   ALT 37 06/10/2014 1649   ALKPHOS 184 (  H) 09/27/2018 1407   ALKPHOS 92 06/10/2014 1649   BILITOT 0.4 09/27/2018 1407   BILITOT 0.6 06/10/2014 1649   GFRNONAA 54 (L) 09/27/2018 1407   GFRNONAA >60 06/12/2014 0518   GFRNONAA >60 04/30/2013 2241   GFRAA >60 09/27/2018 1407   GFRAA >60 06/12/2014 0518   GFRAA >60 04/30/2013 2241     Assessment and Plan: 1. Primary adenocarcinoma of lung, unspecified laterality ([place])   2. Leg swelling   3. Malignant neoplasm of left female breast, unspecified estrogen receptor status, unspecified site of breast (HCC)   4. Cardiac murmur   5. At risk for falls   6. Closed displaced fracture of pubis, unspecified laterality, sequela   7. Use of aromatase inhibitors   8. Osteopenia, unspecified location     # Metastatic lung adenocarcinoma,  cT2 N3 M1a, MET fusion, tolerating crizotinib 265m BID Labs are reviewed and discussed with patient, recommend continue  current regimen.  Will obtain CT chest to assess treatment response prior to next visit.   # Stage IB cT1b,N1 M0  ER+PR+  grade 1 breast cancer, currently on Arimidex treatment, tolerating well.  Continue.   # Osteopenia, recommend patient to take calcium and vitamin D supplements. Last DEXA was done in 2019, due in 2021.  Recommend obtaining dental clearance for bisphosphonate treatment- dental clearance delayed by COVID-19.   # At risk of falls, precaution discussed with patients.  # pubis fracture, follow up with orthopedic surgeon and physical therapy.  # Bilateral lower extremities edema, murmur, check 2D echo.    Follow Up Instructions: 10/21/2018 Lab MD assessment.  Orders Placed This Encounter  Procedures  . CT Chest W Contrast    Standing Status:   Future    Standing Expiration Date:   09/28/2019    Order Specific Question:   ** REASON FOR EXAM (FREE TEXT)    Answer:   lung cancer and breast cancer on chemo, access response.    Order Specific Question:   If indicated for the ordered procedure, I authorize the administration of contrast media per Radiology protocol    Answer:   Yes    Order Specific Question:   Preferred imaging location?    Answer:   [facility]    Order Specific Question:   Radiology Contrast Protocol - do NOT remove file path    Answer:   [email or web address]  . ECHOCARDIOGRAM COMPLETE    Standing Status:   Future    Standing Expiration Date:   12/29/2019    Order Specific Question:   Where should this test be performed    Answer:   [facility]    Order Specific Question:   Perflutren DEFINITY (image enhancing agent) should be administered unless hypersensitivity or allergy exist    Answer:   Administer Perflutren    Order Specific Question:   Reason for exam-Echo    Answer:   Other-Full Diagnosis List    Order Specific Question:   Full ICD-10/Reason for Exam    Answer:   Bilateral leg edema [[[id number]]   Order  Specific Question:   Full ICD-10/Reason for Exam    Answer:   Murmur, cardiac [[[id number]]   Order Specific Question:   Other Comments    Answer:   bilateral leg swelling     I discussed the assessment and treatment plan with the patient. The patient was provided an opportunity to ask questions and all were answered. The patient agreed with the plan and demonstrated an understanding  of the instructions.  The patient was advised to call back or seek an in-person evaluation if the symptoms worsen or if the condition fails to improve as anticipated.    Earlie Server, MD 09/28/2018 9:39 AM

## 2018-09-29 ENCOUNTER — Other Ambulatory Visit: Payer: Self-pay | Admitting: Family Medicine

## 2018-10-05 ENCOUNTER — Other Ambulatory Visit: Payer: Self-pay | Admitting: Oncology

## 2018-10-05 DIAGNOSIS — C3490 Malignant neoplasm of unspecified part of unspecified bronchus or lung: Secondary | ICD-10-CM

## 2018-10-08 MED FILL — XALKORI 250 MG CAPSULE: 250 | 30 days supply | Qty: 60 | Fill #0

## 2018-10-09 ENCOUNTER — Other Ambulatory Visit: Payer: Self-pay

## 2018-10-09 ENCOUNTER — Observation Stay
Admission: EM | Admit: 2018-10-09 | Discharge: 2018-10-11 | Disposition: A | Discharge status: Home / Self Care | Payer: Medicare Other | Attending: Internal Medicine | Admitting: Internal Medicine

## 2018-10-09 DIAGNOSIS — K581 Irritable bowel syndrome with constipation: Secondary | ICD-10-CM | POA: Insufficient documentation

## 2018-10-09 DIAGNOSIS — K219 Gastro-esophageal reflux disease without esophagitis: Secondary | ICD-10-CM | POA: Insufficient documentation

## 2018-10-09 DIAGNOSIS — E785 Hyperlipidemia, unspecified: Secondary | ICD-10-CM | POA: Insufficient documentation

## 2018-10-09 DIAGNOSIS — Z882 Allergy status to sulfonamides status: Secondary | ICD-10-CM | POA: Diagnosis not present

## 2018-10-09 DIAGNOSIS — I350 Nonrheumatic aortic (valve) stenosis: Secondary | ICD-10-CM | POA: Diagnosis not present

## 2018-10-09 DIAGNOSIS — M1990 Unspecified osteoarthritis, unspecified site: Secondary | ICD-10-CM | POA: Insufficient documentation

## 2018-10-09 DIAGNOSIS — Z1159 Encounter for screening for other viral diseases: Secondary | ICD-10-CM | POA: Diagnosis not present

## 2018-10-09 DIAGNOSIS — Z9221 Personal history of antineoplastic chemotherapy: Secondary | ICD-10-CM | POA: Insufficient documentation

## 2018-10-09 DIAGNOSIS — Z79899 Other long term (current) drug therapy: Secondary | ICD-10-CM | POA: Diagnosis not present

## 2018-10-09 DIAGNOSIS — R509 Fever, unspecified: Secondary | ICD-10-CM | POA: Diagnosis not present

## 2018-10-09 DIAGNOSIS — F419 Anxiety disorder, unspecified: Secondary | ICD-10-CM | POA: Insufficient documentation

## 2018-10-09 DIAGNOSIS — Z20828 Contact with and (suspected) exposure to other viral communicable diseases: Secondary | ICD-10-CM | POA: Diagnosis not present

## 2018-10-09 DIAGNOSIS — C50919 Malignant neoplasm of unspecified site of unspecified female breast: Secondary | ICD-10-CM | POA: Diagnosis not present

## 2018-10-09 DIAGNOSIS — R05 Cough: Secondary | ICD-10-CM | POA: Diagnosis not present

## 2018-10-09 DIAGNOSIS — M797 Fibromyalgia: Secondary | ICD-10-CM | POA: Insufficient documentation

## 2018-10-09 DIAGNOSIS — F329 Major depressive disorder, single episode, unspecified: Secondary | ICD-10-CM | POA: Insufficient documentation

## 2018-10-09 DIAGNOSIS — G3184 Mild cognitive impairment, so stated: Secondary | ICD-10-CM | POA: Diagnosis not present

## 2018-10-09 LAB — COMPREHENSIVE METABOLIC PANEL
ALT: 37 U/L (ref 0–44)
AST: 28 U/L (ref 15–41)
Albumin: 2.8 g/dL — ABNORMAL LOW (ref 3.5–5.0)
Alkaline Phosphatase: 153 U/L — ABNORMAL HIGH (ref 38–126)
Anion gap: 7 (ref 5–15)
BUN: 22 mg/dL (ref 8–23)
CO2: 25 mmol/L (ref 22–32)
Calcium: 7.6 mg/dL — ABNORMAL LOW (ref 8.9–10.3)
Chloride: 104 mmol/L (ref 98–111)
Creatinine, Ser: 0.98 mg/dL (ref 0.44–1.00)
GFR calc Af Amer: >60 mL/min (ref >60)
GFR calc non Af Amer: 54 mL/min — ABNORMAL LOW (ref >60)
Glucose, Bld: 129 mg/dL — ABNORMAL HIGH (ref 70–99)
Potassium: 3.8 mmol/L (ref 3.5–5.1)
Sodium: 136 mmol/L (ref 135–145)
Total Bilirubin: 0.5 mg/dL (ref 0.3–1.2)
Total Protein: 5.5 g/dL — ABNORMAL LOW (ref 6.5–8.1)

## 2018-10-09 LAB — CBC WITH DIFFERENTIAL/PLATELET
Abs Immature Granulocytes: 0.06 10*3/uL (ref 0.00–0.07)
Basophils Absolute: 0 10*3/uL (ref 0.0–0.1)
Basophils Relative: 0 %
Eosinophils Absolute: 0.5 10*3/uL (ref 0.0–0.5)
Eosinophils Relative: 5 %
HCT: 28.7 % — ABNORMAL LOW (ref 36.0–46.0)
Hemoglobin: 9.5 g/dL — ABNORMAL LOW (ref 12.0–15.0)
Immature Granulocytes: 1 %
Lymphocytes Relative: 11 %
Lymphs Abs: 1 10*3/uL (ref 0.7–4.0)
MCH: 31.6 pg (ref 26.0–34.0)
MCHC: 33.1 g/dL (ref 30.0–36.0)
MCV: 95.3 fL (ref 80.0–100.0)
Monocytes Absolute: 1.2 10*3/uL — ABNORMAL HIGH (ref 0.1–1.0)
Monocytes Relative: 13 %
Neutro Abs: 6.3 10*3/uL (ref 1.7–7.7)
Neutrophils Relative %: 70 %
Platelets: 333 10*3/uL (ref 150–400)
RBC: 3.01 MIL/uL — ABNORMAL LOW (ref 3.87–5.11)
RDW: 14.1 % (ref 11.5–15.5)
Smear Review: NORMAL
WBC: 9 10*3/uL (ref 4.0–10.5)
nRBC: 0 % (ref 0.0–0.2)

## 2018-10-09 LAB — LACTIC ACID, PLASMA: Lactic Acid, Venous: 0.7 mmol/L (ref 0.5–1.9)

## 2018-10-09 MED ORDER — SODIUM CHLORIDE 0.9 % IV SOLN
1.0000 g | Freq: Once | INTRAVENOUS | Status: AC
  Administered 2018-10-10: 1 g via INTRAVENOUS
  Filled 2018-10-09: qty 10

## 2018-10-09 MED ORDER — SODIUM CHLORIDE 0.9 % IV SOLN
500.0000 mg | Freq: Once | INTRAVENOUS | Status: AC
  Administered 2018-10-10: 500 mg via INTRAVENOUS
  Filled 2018-10-09: qty 500

## 2018-10-09 NOTE — ED Triage Notes (Addendum)
Pt arrives to ED from home via [facility] with c/c of fever and generalized weakness. Pt is currently undergoing chemotherapy for breast and lung cancer. Pt had fever of 101 earlier this evening. EMS reports vitals of 124/51, p68, NSR, O2 sat 93% on room air. CBG 129. Upon arrival pt A&Ox4, NAD, no respiratory distress evident. Pt denies vomiting/diarrhea, pain with urination

## 2018-10-09 NOTE — ED Provider Notes (Signed)
[facility] Emergency Department Provider Note   First MD Initiated Contact with Patient 10/09/18 2308     (approximate)  I have reviewed the triage vital signs and the nursing notes.   HISTORY  Chief Complaint Fever and Weakness    HPI Jamie Matthews is a 78 y.o. female with history of lung and breast cancer presents to the emergency department with fever at home temperature of 101 with associated coughing.  She also admits to nausea.  Patient denies any vomiting no diarrhea constipation.  Patient denies any urinary symptoms.  Patient denies any pain.  Patient denies any known sick contact.          Past Medical History:  Diagnosis Date  . AKI (acute kidney injury) ([place]) 08/17/2018  . Anxiety   . Arthritis   . Belching   . Bladder disorder    OVERACTIVE  . Bowel dysfunction    BLOCKAGE  . Cancer (HCC)    breast  . Constipation   . Depression   . Diverticulitis   . Fibromyalgia   . GERD (gastroesophageal reflux disease)   . Hyperlipidemia   . IBS (irritable bowel syndrome)   . Internal hemorrhoids   . Memory deficits   . Murmur    asymptomatic  . Pneumonia 11/18/12  . Urinary incontinence   . Vertigo     Patient Active Problem List   Diagnosis Date Noted  . Fever 10/10/2018  . Closed fracture pubis with nonunion 09/20/2018  . Localized swelling of right lower leg 09/20/2018  . AKI (acute kidney injury) ([place]) 08/17/2018  . Myofascial pain 07/28/2018  . Primary adenocarcinoma of lung ([place]) 07/21/2018  . Malignant neoplasm of left female breast ([place]) 07/21/2018  . Adenopathy   . Goals of care, counseling/discussion 06/26/2018  . IBS (irritable bowel syndrome) 05/25/2018  . BPV (benign positional vertigo) 07/30/2017  . Unilateral primary osteoarthritis, left knee 06/24/2017  . Bunion of great toe of right foot 06/09/2017  . Dizziness 03/24/2017  . Decreased appetite 12/03/2016  . Fatigue 09/22/2016  . Insomnia  09/22/2016  . Depression, recurrent ([place]) 09/22/2016  . Primary localized osteoarthrosis, hand 03/19/2016  . OA (osteoarthritis) of knee 03/17/2016  . Stress due to illness of family member 10/10/2014  . Ileitis 06/22/2014  . Chronic insomnia 09/20/2013  . DNR (do not resuscitate) 11/15/2012  . Chronic constipation 04/01/2012  . 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 Urinary incontinence   . Major depressive disorder, recurrent, moderate   . GERD (gastroesophageal reflux disease)   . Hyperlipidemia     Past Surgical History:  Procedure Laterality Date  . AXILLARY LYMPH NODE BIOPSY Left 06/08/2018   INVASIVE MAMMARY CARCINOMA  . BLADDER SUSPENSION  2004, 2012  . BREAST BIOPSY Left 06/08/2018   INVASIVE MAMMARY CARCINOMA  . CATARACT EXTRACTION W/PHACO Right 08/27/2015   Procedure: CATARACT EXTRACTION PHACO AND INTRAOCULAR LENS PLACEMENT (IOC);  Surgeon: Estill Cotta, MD;  Location: [facility] ORS;  Service: Ophthalmology;  Laterality: Right;  [place]   1:00.2 AP%  22.5 CDE  23.67 fluid casette lot #[id number]  exp05/31/2018  . CATARACT EXTRACTION W/PHACO Left 10/15/2015   Procedure: CATARACT EXTRACTION PHACO AND INTRAOCULAR LENS PLACEMENT (IOC);  Surgeon: Estill Cotta, MD;  Location: [facility] ORS;  Service: Ophthalmology;  Laterality: Left;  [place] 01:07 AP% 18.1 CDE 21.57 fluid pack lot # [id number]  . CHOLECYSTECTOMY    . COLONOSCOPY  2017  . ELECTROMAGNETIC NAVIGATION BROCHOSCOPY N/A 07/09/2018   Procedure: ELECTROMAGNETIC NAVIGATION BRONCHOSCOPY;  Surgeon:  Flora Lipps, MD;  Location: [facility] ORS;  Service: Cardiopulmonary;  Laterality: N/A;  . TONSILLECTOMY  1947    Prior to Admission medications   Medication Sig Start Date End Date Taking? Authorizing Provider  acetaminophen (TYLENOL) 325 MG tablet Take 325 mg by mouth every 6 (six) hours as needed (for pain).    [provider]  anastrozole (ARIMIDEX) 1 MG tablet Take 1 tablet (1 mg total) by mouth daily. 08/09/18   Earlie Server, MD  atorvastatin  (LIPITOR) 20 MG tablet TAKE 1 TABLET DAILY Patient taking differently: Take 20 mg by mouth at bedtime.  11/17/17   Lucille Passy, MD  buPROPion (WELLBUTRIN XL) 150 MG 24 hr tablet TAKE ONE TABLET BY MOUTH EVERY DAY Patient taking differently: Take 150 mg by mouth daily.  09/29/18   Lucille Passy, MD  cyclobenzaprine (FLEXERIL) 5 MG tablet Take 1 tablet (5 mg total) by mouth 3 (three) times daily as needed for muscle spasms. 08/23/18   Earlie Server, MD  desonide (DESOWEN) 0.05 % lotion Apply 1 application topically as needed. After showering 11/08/15   [provider]  DEXILANT 60 MG capsule TAKE 1 CAPSULE BY MOUTH ONCE DAILY Patient taking differently: Take 60 mg by mouth daily.  08/30/18   Pyrtle, Lajuan Lines, MD  diphenhydrAMINE (BENADRYL) 25 MG tablet Take 25 mg by mouth every 8 (eight) hours as needed (allergies).     [provider]  famotidine (PEPCID) 20 MG tablet Take 20 mg by mouth 2 (two) times daily.    Pyrtle, Lajuan Lines, MD  ketoconazole (NIZORAL) 2 % shampoo Apply 1 application topically once a week.  04/20/18   [provider]  lubiprostone (AMITIZA) 24 MCG capsule TAKE ONE CAPSULE TWICE A DAY WITH MEALS Patient taking differently: Take 24 mcg by mouth 2 (two) times daily.  02/09/18   Pyrtle, Lajuan Lines, MD  memantine (NAMENDA) 10 MG tablet TAKE ONE TABLET BY MOUTH TWICE DAILY Patient taking differently: Take 10 mg by mouth 2 (two) times daily.  09/29/18   Lucille Passy, MD  Multiple Vitamin (MULTIVITAMIN WITH MINERALS) TABS tablet Take 1 tablet by mouth daily.    [provider]  MYRBETRIQ 50 MG TB24 tablet TAKE 1 TABLET BY MOUTH DAILY Patient taking differently: Take 50 mg by mouth every morning.  05/25/18   Lucille Passy, MD  polyethylene glycol [facility] / Floria Raveling) packet Take 17 g by mouth daily.    [provider]  solifenacin (VESICARE) 5 MG tablet Take 1 tablet (5 mg total) by mouth daily. 08/20/17   Lucille Passy, MD  triamcinolone (NASACORT) 55 MCG/ACT AERO  nasal inhaler Place 1 spray into the nose as needed.     [provider]  venlafaxine XR (EFFEXOR-XR) 150 MG 24 hr capsule TAKE 1 CAPSULE BY MOUTH DAILY Patient taking differently: Take 150 mg by mouth daily with breakfast.  05/25/18   Lucille Passy, MD  Hulda Humphrey 250 MG capsule TAKE 1 CAPSULE (250 MG TOTAL) BY MOUTH 2 TIMES DAILY. Patient taking differently: Take 250 mg by mouth 2 (two) times daily.  10/05/18   Earlie Server, MD    Allergies Sulfa antibiotics  Family History  Problem Relation Age of Onset  . Diabetes Father   . Heart disease Father   . Lymphoma Father   . Heart disease Mother   . Breast cancer Sister 36  . Colon cancer Neg Hx   . Esophageal cancer Neg Hx   . Rectal  cancer Neg Hx   . Stomach cancer Neg Hx   . Bladder Cancer Neg Hx   . Kidney cancer Neg Hx     Social History Social History   Tobacco Use  . Smoking status: Never Smoker  . Smokeless tobacco: Never Used  Substance Use Topics  . Alcohol use: Yes    Comment: rare wine  . Drug use: No    Review of Systems Constitutional: Positive for fever/chills Eyes: No visual changes. ENT: No sore throat. Cardiovascular: Denies chest pain. Respiratory: Denies shortness of breath.  Positive for cough Gastrointestinal: No abdominal pain.  No nausea, no vomiting.  No diarrhea.  No constipation. Genitourinary: Negative for dysuria. Musculoskeletal: Negative for neck pain.  Negative for back pain. Integumentary: Negative for rash. Neurological: Negative for headaches, focal weakness or numbness.   ____________________________________________   PHYSICAL EXAM:  VITAL SIGNS: ED Triage Vitals  Enc Vitals Group     BP 10/09/18 2224 (!) 113/41     Pulse Rate 10/09/18 2224 61     Resp 10/09/18 2224 (!) 21     Temp 10/09/18 2224 99.1 F (37.3 C)     Temp Source 10/09/18 2224 Oral     SpO2 10/09/18 2224 91 %     Weight 10/09/18 2234 68.9 kg (152 lb)     Height 10/09/18 2234 1.6 m (5\' 3" )     Head  Circumference --      Peak Flow --      Pain Score 10/09/18 2234 0     Pain Loc --      Pain Edu? --      Excl. in [place]? --     Constitutional: Alert and oriented. Well appearing and in no acute distress. Eyes: Conjunctivae are normal.  Head: Atraumatic. Mouth/Throat: Mucous membranes are moist. Oropharynx non-erythematous. Neck: No stridor.  Cardiovascular: Normal rate, regular rhythm. Good peripheral circulation. Grossly normal heart sounds. Respiratory: Normal respiratory effort.  No retractions. No audible wheezing. Gastrointestinal: Soft and nontender. No distention.  Musculoskeletal: No lower extremity tenderness nor edema. No gross deformities of extremities. Neurologic:  Normal speech and language. No gross focal neurologic deficits are appreciated.  Skin:  Skin is warm, dry and intact. No rash noted. Psychiatric: Mood and affect are normal. Speech and behavior are normal.  ____________________________________________   LABS (all labs ordered are listed, but only abnormal results are displayed)  Labs Reviewed  COMPREHENSIVE METABOLIC PANEL - Abnormal; Notable for the following components:      Result Value   Glucose, Bld 129 (*)    Calcium 7.6 (*)    Total Protein 5.5 (*)    Albumin 2.8 (*)    Alkaline Phosphatase 153 (*)    GFR calc non Af Amer 54 (*)    All other components within normal limits  CBC WITH DIFFERENTIAL/PLATELET - Abnormal; Notable for the following components:   RBC 3.01 (*)    Hemoglobin 9.5 (*)    HCT 28.7 (*)    Monocytes Absolute 1.2 (*)    All other components within normal limits  SARS CORONAVIRUS 2 (HOSPITAL ORDER, [place] [facility])  CULTURE, BLOOD (ROUTINE X 2)  CULTURE, BLOOD (ROUTINE X 2)  LACTIC ACID, PLASMA  URINALYSIS, ROUTINE W REFLEX MICROSCOPIC  TSH    RADIOLOGY I, [place] Zariyah Stephens, personally viewed and evaluated these images (plain radiographs) as part of my medical decision making, as well as reviewing  the written report by the radiologist.  ED MD  interpretation: No active disease noted on chest x-ray per radiologist  Official radiology report(s): Dg Chest Portable 1 View  Result Date: 10/10/2018 CLINICAL DATA:  Fever EXAM: PORTABLE CHEST 1 VIEW COMPARISON:  08/23/2018 FINDINGS: The heart size and mediastinal contours are within normal limits. Both lungs are clear. Calcification projecting over the left apex is unchanged. The visualized skeletal structures are unremarkable. IMPRESSION: No active disease. Electronically Signed   By: Ulyses Jarred M.D.   On: 10/10/2018 01:05      Procedures   ____________________________________________   INITIAL IMPRESSION / MDM / ASSESSMENT AND PLAN / ED COURSE  As part of my medical decision making, I reviewed the following data within the electronic MEDICAL RECORD NUMBER  78 year old female with above-stated history and physical exam secondary to fever while receiving chemotherapy.  Considered possibly of pneumonia versus UTI versus other potential infectious etiology however none were discovered while in the emerge.  Patient given empiric antibiotic therapy after blood cultures obtained.  Also considered possibility of COVID-19 infection as such that was obtained which was negative.  Patient discussed with Dr. Jannifer Franklin for hospital admission further evaluation and management     *Jamie Matthews was evaluated in Emergency Department on 10/10/2018 for the symptoms described in the history of present illness. She was evaluated in the context of the global COVID-19 pandemic, which necessitated consideration that the patient might be at risk for infection with the SARS-CoV-2 virus that causes COVID-19. Institutional protocols and algorithms that pertain to the evaluation of patients at risk for COVID-19 are in a state of rapid change based on information released by regulatory bodies including the CDC and federal and state organizations. These policies  and algorithms were followed during the patient's care in the ED.*        ____________________________________________  FINAL CLINICAL IMPRESSION(S) / ED DIAGNOSES  Fever  MEDICATIONS GIVEN DURING THIS VISIT:  Medications  enoxaparin (LOVENOX) injection 40 mg (has no administration in time range)  acetaminophen (TYLENOL) tablet 650 mg (has no administration in time range)    Or  acetaminophen (TYLENOL) suppository 650 mg (has no administration in time range)  ondansetron (ZOFRAN) tablet 4 mg (has no administration in time range)    Or  ondansetron (ZOFRAN) injection 4 mg (has no administration in time range)  docusate sodium (COLACE) capsule 100 mg (has no administration in time range)  0.9 %  sodium chloride infusion (has no administration in time range)  cefTRIAXone (ROCEPHIN) 1 g in sodium chloride 0.9 % 100 mL IVPB (0 g Intravenous Stopped 10/10/18 0037)  azithromycin (ZITHROMAX) 500 mg in sodium chloride 0.9 % 250 mL IVPB (0 mg Intravenous Stopped 10/10/18 0111)  benzonatate (TESSALON) capsule 100 mg (100 mg Oral Given 10/10/18 0301)     ED Discharge Orders    None       Note:  This document was prepared using Dragon voice recognition software and may include unintentional dictation errors.   Gregor Hams, MD 10/10/18 0530

## 2018-10-10 ENCOUNTER — Observation Stay (HOSPITAL_BASED_OUTPATIENT_CLINIC_OR_DEPARTMENT_OTHER)
Admit: 2018-10-10 | Discharge: 2018-10-10 | Disposition: A | Discharge status: Home / Self Care | Payer: Medicare Other | Attending: Internal Medicine | Admitting: Internal Medicine

## 2018-10-10 ENCOUNTER — Emergency Department: Payer: Medicare Other

## 2018-10-10 DIAGNOSIS — I5031 Acute diastolic (congestive) heart failure: Secondary | ICD-10-CM | POA: Diagnosis not present

## 2018-10-10 DIAGNOSIS — E785 Hyperlipidemia, unspecified: Secondary | ICD-10-CM | POA: Diagnosis not present

## 2018-10-10 DIAGNOSIS — R509 Fever, unspecified: Secondary | ICD-10-CM | POA: Diagnosis not present

## 2018-10-10 DIAGNOSIS — C50919 Malignant neoplasm of unspecified site of unspecified female breast: Secondary | ICD-10-CM | POA: Diagnosis not present

## 2018-10-10 DIAGNOSIS — F329 Major depressive disorder, single episode, unspecified: Secondary | ICD-10-CM | POA: Diagnosis not present

## 2018-10-10 LAB — URINALYSIS, ROUTINE W REFLEX MICROSCOPIC
Bilirubin Urine: NEGATIVE
Glucose, UA: NEGATIVE mg/dL
Hgb urine dipstick: NEGATIVE
Ketones, ur: NEGATIVE mg/dL
Leukocytes,Ua: NEGATIVE
Nitrite: NEGATIVE
Protein, ur: NEGATIVE mg/dL
Specific Gravity, Urine: 1.006 (ref 1.005–1.030)
pH: 6 (ref 5.0–8.0)

## 2018-10-10 LAB — ECHOCARDIOGRAM COMPLETE
Height: 63 in
Weight: 2476.21 oz

## 2018-10-10 LAB — TSH: TSH: 1.32 u[IU]/mL (ref 0.350–4.500)

## 2018-10-10 LAB — SARS CORONAVIRUS 2 BY RT PCR (HOSPITAL ORDER, PERFORMED IN ~~LOC~~ HOSPITAL LAB): SARS Coronavirus 2: NEGATIVE

## 2018-10-10 MED ORDER — ENOXAPARIN SODIUM 40 MG/0.4ML ~~LOC~~ SOLN
40.0000 mg | SUBCUTANEOUS | Status: DC
  Administered 2018-10-10: 40 mg via SUBCUTANEOUS
  Filled 2018-10-10: qty 0.4

## 2018-10-10 MED ORDER — ONDANSETRON HCL 4 MG PO TABS: 4.0000 mg | ORAL_TABLET | Freq: Four times a day (QID) | ORAL | Status: DC | PRN

## 2018-10-10 MED ORDER — LACTULOSE 10 GM/15ML PO SOLN
30.0000 g | Freq: Once | ORAL | Status: AC
  Administered 2018-10-10: 16:00:00 30 g via ORAL
  Filled 2018-10-10: qty 60

## 2018-10-10 MED ORDER — SODIUM CHLORIDE 0.9 % IV SOLN
1.0000 g | INTRAVENOUS | Status: DC
  Administered 2018-10-10: 1 g via INTRAVENOUS
  Filled 2018-10-10: qty 1
  Filled 2018-10-10: qty 10

## 2018-10-10 MED ORDER — ACETAMINOPHEN 325 MG PO TABS
650.0000 mg | ORAL_TABLET | Freq: Four times a day (QID) | ORAL | Status: DC | PRN
  Administered 2018-10-10: 650 mg via ORAL
  Filled 2018-10-10: qty 2

## 2018-10-10 MED ORDER — BENZONATATE 100 MG PO CAPS
100.0000 mg | ORAL_CAPSULE | Freq: Three times a day (TID) | ORAL | Status: DC | PRN
  Administered 2018-10-10 – 2018-10-11 (×2): 100 mg via ORAL
  Filled 2018-10-10 (×2): qty 1

## 2018-10-10 MED ORDER — ONDANSETRON HCL 4 MG/2ML IJ SOLN: 4.0000 mg | Freq: Four times a day (QID) | INTRAMUSCULAR | Status: DC | PRN

## 2018-10-10 MED ORDER — SODIUM CHLORIDE 0.9 % IV SOLN
500.0000 mg | INTRAVENOUS | Status: DC
  Administered 2018-10-11: 500 mg via INTRAVENOUS
  Filled 2018-10-10 (×2): qty 500

## 2018-10-10 MED ORDER — BENZONATATE 100 MG PO CAPS
100.0000 mg | ORAL_CAPSULE | Freq: Once | ORAL | Status: AC
  Administered 2018-10-10: 03:00:00 100 mg via ORAL
  Filled 2018-10-10: qty 1

## 2018-10-10 MED ORDER — ALUM & MAG HYDROXIDE-SIMETH 200-200-20 MG/5ML PO SUSP
30.0000 mL | ORAL | Status: DC | PRN
  Administered 2018-10-10: 30 mL via ORAL
  Filled 2018-10-10: qty 30

## 2018-10-10 MED ORDER — SODIUM CHLORIDE 0.9 % IV SOLN
INTRAVENOUS | Status: DC
  Administered 2018-10-10 – 2018-10-11 (×2): via INTRAVENOUS

## 2018-10-10 MED ORDER — ACETAMINOPHEN 650 MG RE SUPP: 650.0000 mg | Freq: Four times a day (QID) | RECTAL | Status: DC | PRN

## 2018-10-10 MED ORDER — DOCUSATE SODIUM 100 MG PO CAPS
100.0000 mg | ORAL_CAPSULE | Freq: Two times a day (BID) | ORAL | Status: DC
  Administered 2018-10-10 – 2018-10-11 (×3): 100 mg via ORAL
  Filled 2018-10-10 (×2): qty 1

## 2018-10-10 NOTE — Progress Notes (Signed)
CODE SEPSIS - PHARMACY COMMUNICATION  **Broad Spectrum Antibiotics should be administered within 1 hour of Sepsis diagnosis**  Time Code Sepsis Called/Page Received:   5/17 @ 2300   Antibiotics Ordered: Azithromycin , Ceftriaxone   Time of 1st antibiotic administration:   Ceftriaxone 1 gm given on 5/17 @ 0007  Additional action taken by pharmacy: Messaged RN around 0000 on 5/17 to remind him to give abx.   If necessary, Name of Provider/Nurse Contacted: Darcel Bayley D ,PharmD Clinical Pharmacist  10/10/2018  12:14 AM

## 2018-10-10 NOTE — ED Notes (Signed)
ED TO INPATIENT HANDOFF REPORT  ED Nurse Name and Phone #: Cash [phone]   S Name/Age/Gender Jamie Matthews 78 y.o. female Room/Bed: ED26A/ED26A  Code Status   Code Status: Not on file  Home/SNF/Other Home Patient oriented to: self, place, time and situation Is this baseline? Yes   Triage Complete: Triage complete  Chief Complaint Fever cancer pt  Triage Note Pt arrives to ED from home via [facility] with c/c of fever and generalized weakness. Pt is currently undergoing chemotherapy for breast and lung cancer. Pt had fever of 101 earlier this evening. EMS reports vitals of 124/51, p68, NSR, O2 sat 93% on room air. CBG 129. Upon arrival pt A&Ox4, NAD, no respiratory distress evident. Pt denies vomiting/diarrhea, pain with urination   Allergies Allergies  Allergen Reactions  . Sulfa Antibiotics Rash    Level of Care/Admitting Diagnosis ED Disposition    ED Disposition Condition [facility] [facility]: [place] [[id number]]  Level of Care: Med-Surg [16]  Covid Evaluation: Screening Protocol (No Symptoms)  Diagnosis: Fever [161096]  Admitting Physician: Harrie Foreman [[id number]]  Attending Physician: Harrie Foreman [phone]  PT Class (Do Not Modify): Observation [104]  PT Acc Code (Do Not Modify): Observation [10022]       B Medical/Surgery History Past Medical History:  Diagnosis Date  . AKI (acute kidney injury) ([place]) 08/17/2018  . Anxiety   . Arthritis   . Belching   . Bladder disorder    OVERACTIVE  . Bowel dysfunction    BLOCKAGE  . Cancer (HCC)    breast  . Constipation   . Depression   . Diverticulitis   . Fibromyalgia   . GERD (gastroesophageal reflux disease)   . Hyperlipidemia   . IBS (irritable bowel syndrome)   . Internal hemorrhoids   . Memory deficits   . Murmur    asymptomatic  . Pneumonia 11/18/12  . Urinary incontinence   . Vertigo    Past Surgical History:  Procedure Laterality Date   . AXILLARY LYMPH NODE BIOPSY Left 06/08/2018   INVASIVE MAMMARY CARCINOMA  . BLADDER SUSPENSION  2004, 2012  . BREAST BIOPSY Left 06/08/2018   INVASIVE MAMMARY CARCINOMA  . CATARACT EXTRACTION W/PHACO Right 08/27/2015   Procedure: CATARACT EXTRACTION PHACO AND INTRAOCULAR LENS PLACEMENT (IOC);  Surgeon: Estill Cotta, MD;  Location: [facility] ORS;  Service: Ophthalmology;  Laterality: Right;  [place]   1:00.2 AP%  22.5 CDE  23.67 fluid casette lot #[id number]  exp05/31/2018  . CATARACT EXTRACTION W/PHACO Left 10/15/2015   Procedure: CATARACT EXTRACTION PHACO AND INTRAOCULAR LENS PLACEMENT (IOC);  Surgeon: Estill Cotta, MD;  Location: [facility] ORS;  Service: Ophthalmology;  Laterality: Left;  [place] 01:07 AP% 18.1 CDE 21.57 fluid pack lot # [id number]  . CHOLECYSTECTOMY    . COLONOSCOPY  2017  . ELECTROMAGNETIC NAVIGATION BROCHOSCOPY N/A 07/09/2018   Procedure: ELECTROMAGNETIC NAVIGATION BRONCHOSCOPY;  Surgeon: Flora Lipps, MD;  Location: [facility] ORS;  Service: Cardiopulmonary;  Laterality: N/A;  . TONSILLECTOMY  1947     A IV Location/Drains/Wounds Patient Lines/Drains/Airways Status   Active Line/Drains/Airways    Name:   Placement date:   Placement time:   Site:   Days:   Peripheral IV 08/20/18 Left Antecubital   08/20/18    1330    Antecubital   51   Peripheral IV 10/09/18 Left Hand   10/09/18    2350    Hand   1   Incision (Closed) 08/27/15 Eye Right  08/27/15    1127     1140   Incision (Closed) 10/15/15 Eye Left   10/15/15    1125     1091          Intake/Output Last 24 hours  Intake/Output Summary (Last 24 hours) at 10/10/2018 0310 Last data filed at 10/10/2018 0111 Gross per 24 hour  Intake 349.85 ml  Output -  Net 349.85 ml    Labs/Imaging Results for orders placed or performed during the hospital encounter of 10/09/18 (from the past 48 hour(s))  Comprehensive metabolic panel     Status: Abnormal   Collection Time: 10/09/18 10:56 PM  Result Value Ref Range   Sodium 136 135 -  145 mmol/L   Potassium 3.8 3.5 - 5.1 mmol/L   Chloride 104 98 - 111 mmol/L   CO2 25 22 - 32 mmol/L   Glucose, Bld 129 (H) 70 - 99 mg/dL   BUN 22 8 - 23 mg/dL   Creatinine, Ser 0.98 0.44 - 1.00 mg/dL   Calcium 7.6 (L) 8.9 - 10.3 mg/dL   Total Protein 5.5 (L) 6.5 - 8.1 g/dL   Albumin 2.8 (L) 3.5 - 5.0 g/dL   AST 28 15 - 41 U/L   ALT 37 0 - 44 U/L   Alkaline Phosphatase 153 (H) 38 - 126 U/L   Total Bilirubin 0.5 0.3 - 1.2 mg/dL   GFR calc non Af Amer 54 (L) >60 mL/min   GFR calc Af Amer >60 >60 mL/min   Anion gap 7 5 - 15    Comment: Performed at [facility], [address] [place], [place] [address]  CBC WITH DIFFERENTIAL     Status: Abnormal   Collection Time: 10/09/18 10:56 PM  Result Value Ref Range   WBC 9.0 4.0 - 10.5 K/uL   RBC 3.01 (L) 3.87 - 5.11 MIL/uL   Hemoglobin 9.5 (L) 12.0 - 15.0 g/dL   HCT 28.7 (L) 36.0 - 46.0 %   MCV 95.3 80.0 - 100.0 fL   MCH 31.6 26.0 - 34.0 pg   MCHC 33.1 30.0 - 36.0 g/dL   RDW 14.1 11.5 - 15.5 %   Platelets 333 150 - 400 K/uL   nRBC 0.0 0.0 - 0.2 %   Neutrophils Relative % 70 %   Neutro Abs 6.3 1.7 - 7.7 K/uL   Lymphocytes Relative 11 %   Lymphs Abs 1.0 0.7 - 4.0 K/uL   Monocytes Relative 13 %   Monocytes Absolute 1.2 (H) 0.1 - 1.0 K/uL   Eosinophils Relative 5 %   Eosinophils Absolute 0.5 0.0 - 0.5 K/uL   Basophils Relative 0 %   Basophils Absolute 0.0 0.0 - 0.1 K/uL   WBC Morphology MORPHOLOGY UNREMARKABLE    RBC Morphology MORPHOLOGY UNREMARKABLE    Smear Review Normal platelet morphology    Immature Granulocytes 1 %   Abs Immature Granulocytes 0.06 0.00 - 0.07 K/uL    Comment: Performed at [facility], [address] [place], [place] [address]  Lactic acid, plasma     Status: None   Collection Time: 10/09/18 10:56 PM  Result Value Ref Range   Lactic Acid, Venous 0.7 0.5 - 1.9 mmol/L    Comment: Performed at [facility], [address] [place], [place] [address]  SARS Coronavirus 2 (CEPHEID-  Performed in [place] hospital lab), Hosp Order     Status: None   Collection Time: 10/09/18 11:58 PM  Result Value Ref Range  SARS Coronavirus 2 NEGATIVE NEGATIVE    Comment: (NOTE) If result is NEGATIVE SARS-CoV-2 target nucleic acids are NOT DETECTED. The SARS-CoV-2 RNA is generally detectable in upper and lower  respiratory specimens during the acute phase of infection. The lowest  concentration of SARS-CoV-2 viral copies this assay can detect is 250  copies / mL. A negative result does not preclude SARS-CoV-2 infection  and should not be used as the sole basis for treatment or other  patient management decisions.  A negative result may occur with  improper specimen collection / handling, submission of specimen other  than nasopharyngeal swab, presence of viral mutation(s) within the  areas targeted by this assay, and inadequate number of viral copies  (<250 copies / mL). A negative result must be combined with clinical  observations, patient history, and epidemiological information. If result is POSITIVE SARS-CoV-2 target nucleic acids are DETECTED. The SARS-CoV-2 RNA is generally detectable in upper and lower  respiratory specimens dur ing the acute phase of infection.  Positive  results are indicative of active infection with SARS-CoV-2.  Clinical  correlation with patient history and other diagnostic information is  necessary to determine patient infection status.  Positive results do  not rule out bacterial infection or co-infection with other viruses. If result is PRESUMPTIVE POSTIVE SARS-CoV-2 nucleic acids MAY BE PRESENT.   A presumptive positive result was obtained on the submitted specimen  and confirmed on repeat testing.  While 2019 novel coronavirus  (SARS-CoV-2) nucleic acids may be present in the submitted sample  additional confirmatory testing may be necessary for epidemiological  and / or clinical management purposes  to differentiate between  SARS-CoV-2  and other Sarbecovirus currently known to infect humans.  If clinically indicated additional testing with an alternate test  methodology [phone]) is advised. The SARS-CoV-2 RNA is generally  detectable in upper and lower respiratory sp ecimens during the acute  phase of infection. The expected result is Negative. Fact Sheet for Patients:  StrictlyIdeas.no Fact Sheet for Healthcare Providers: BankingDealers.co.za This test is not yet approved or cleared by the [place] FDA and has been authorized for detection and/or diagnosis of SARS-CoV-2 by FDA under an Emergency Use Authorization (EUA).  This EUA will remain in effect (meaning this test can be used) for the duration of the COVID-19 declaration under Section 564(b)(1) of the Act, 21 U.S.C. section 360bbb-3(b)(1), unless the authorization is terminated or revoked sooner. Performed at [facility], [address] [place],  [address]    Dg Chest Portable 1 View  Result Date: 10/10/2018 CLINICAL DATA:  Fever EXAM: PORTABLE CHEST 1 VIEW COMPARISON:  08/23/2018 FINDINGS: The heart size and mediastinal contours are within normal limits. Both lungs are clear. Calcification projecting over the left apex is unchanged. The visualized skeletal structures are unremarkable. IMPRESSION: No active disease. Electronically Signed   By: Ulyses Jarred M.D.   On: 10/10/2018 01:05    Pending Labs Unresulted Labs (From admission, onward)    Start     Ordered   10/09/18 2307  Blood Culture (routine x 2)  BLOOD CULTURE X 2,   STAT     10/09/18 2307   10/09/18 2307  Urinalysis, Routine w reflex microscopic  ONCE - STAT,   STAT     10/09/18 2307   Signed and Held  Creatinine, serum  (enoxaparin (LOVENOX)    CrCl >/= 30 ml/min)  Weekly,   R    Comments:  while on enoxaparin therapy  Signed and Held   Signed and Held  TSH  Add-on,   R     Signed and Held           Vitals/Pain Today's Vitals   10/10/18 0100 10/10/18 0130 10/10/18 0200 10/10/18 0230  BP: (!) 94/48 (!) 98/44 (!) 95/44 (!) 92/42  Pulse: (!) 58 (!) 56 (!) 55 (!) 54  Resp: 17 16 15 16   Temp:      TempSrc:      SpO2: 93% 94% 97% 91%  Weight:      Height:      PainSc:        Isolation Precautions Droplet and Contact precautions  Medications Medications  cefTRIAXone (ROCEPHIN) 1 g in sodium chloride 0.9 % 100 mL IVPB (0 g Intravenous Stopped 10/10/18 0037)  azithromycin (ZITHROMAX) 500 mg in sodium chloride 0.9 % 250 mL IVPB (0 mg Intravenous Stopped 10/10/18 0111)  benzonatate (TESSALON) capsule 100 mg (100 mg Oral Given 10/10/18 0301)    Mobility walks with person assist High fall risk   Focused Assessments Pulmonary Assessment Handoff:  Lung sounds:   O2 Device: Room Air        R Recommendations: See Admitting Provider Note  Report given to:   Additional Notes:

## 2018-10-10 NOTE — Progress Notes (Signed)
*  PRELIMINARY RESULTS* Echocardiogram 2D Echocardiogram has been performed.  [place] 10/10/2018, 2:23 PM

## 2018-10-10 NOTE — Care Management Obs Status (Signed)
[place] NOTIFICATION   Patient Details  Name: Jamie Matthews MRN: [id number] Date of Birth: August 17, 1940   Medicare Observation Status Notification Given:  Yes    Shanaia Sievers A Shamal Stracener, RN 10/10/2018, 9:35 AM

## 2018-10-10 NOTE — H&P (Signed)
Jamie Matthews is an 78 y.o. female.   Chief Complaint: Fever HPI: The patient with past medical history of hyperlipidemia who is currently undergoing chemotherapy for breast cancer presents to the emergency department complaining of fever.  The patient reports temperature of 101 F at home.  She also admits to nonproductive intermittent cough.  No travel risk factors or known contact with individuals with exposure to novel coronavirus.  Laboratory evaluation was unremarkable but given the patient's immunocompromise emergency department staff obtained blood cultures and dose antibiotics prior to calling the hospitalist service for admission.  Past Medical History:  Diagnosis Date  . AKI (acute kidney injury) ([place]) 08/17/2018  . Anxiety   . Arthritis   . Belching   . Bladder disorder    OVERACTIVE  . Bowel dysfunction    BLOCKAGE  . Cancer (HCC)    breast  . Constipation   . Depression   . Diverticulitis   . Fibromyalgia   . GERD (gastroesophageal reflux disease)   . Hyperlipidemia   . IBS (irritable bowel syndrome)   . Internal hemorrhoids   . Memory deficits   . Murmur    asymptomatic  . Pneumonia 11/18/12  . Urinary incontinence   . Vertigo     Past Surgical History:  Procedure Laterality Date  . AXILLARY LYMPH NODE BIOPSY Left 06/08/2018   INVASIVE MAMMARY CARCINOMA  . BLADDER SUSPENSION  2004, 2012  . BREAST BIOPSY Left 06/08/2018   INVASIVE MAMMARY CARCINOMA  . CATARACT EXTRACTION W/PHACO Right 08/27/2015   Procedure: CATARACT EXTRACTION PHACO AND INTRAOCULAR LENS PLACEMENT (IOC);  Surgeon: Estill Cotta, MD;  Location: [facility] ORS;  Service: Ophthalmology;  Laterality: Right;  [place]   1:00.2 AP%  22.5 CDE  23.67 fluid casette lot #[id number]  exp05/31/2018  . CATARACT EXTRACTION W/PHACO Left 10/15/2015   Procedure: CATARACT EXTRACTION PHACO AND INTRAOCULAR LENS PLACEMENT (IOC);  Surgeon: Estill Cotta, MD;  Location: [facility] ORS;  Service: Ophthalmology;   Laterality: Left;  [place] 01:07 AP% 18.1 CDE 21.57 fluid pack lot # [id number]  . CHOLECYSTECTOMY    . COLONOSCOPY  2017  . ELECTROMAGNETIC NAVIGATION BROCHOSCOPY N/A 07/09/2018   Procedure: ELECTROMAGNETIC NAVIGATION BRONCHOSCOPY;  Surgeon: Flora Lipps, MD;  Location: [facility] ORS;  Service: Cardiopulmonary;  Laterality: N/A;  . TONSILLECTOMY  1947    Family History  Problem Relation Age of Onset  . Diabetes Father   . Heart disease Father   . Lymphoma Father   . Heart disease Mother   . Breast cancer Sister 31  . Colon cancer Neg Hx   . Esophageal cancer Neg Hx   . Rectal cancer Neg Hx   . Stomach cancer Neg Hx   . Bladder Cancer Neg Hx   . Kidney cancer Neg Hx    Social History:  reports that she has never smoked. She has never used smokeless tobacco. She reports current alcohol use. She reports that she does not use drugs.  Allergies:  Allergies  Allergen Reactions  . Sulfa Antibiotics Rash    Medications Prior to Admission  Medication Sig Dispense Refill  . acetaminophen (TYLENOL) 325 MG tablet Take 325 mg by mouth every 6 (six) hours as needed (for pain).    [name] anastrozole (ARIMIDEX) 1 MG tablet Take 1 tablet (1 mg total) by mouth daily. 30 tablet 3  . atorvastatin (LIPITOR) 20 MG tablet TAKE 1 TABLET DAILY (Patient taking differently: Take 20 mg by mouth at bedtime. ) 90 tablet 3  . buPROPion (WELLBUTRIN XL) 150  MG 24 hr tablet TAKE ONE TABLET BY MOUTH EVERY DAY (Patient taking differently: Take 150 mg by mouth daily. ) 90 tablet 2  . cyclobenzaprine (FLEXERIL) 5 MG tablet Take 1 tablet (5 mg total) by mouth 3 (three) times daily as needed for muscle spasms. 30 tablet 0  . desonide (DESOWEN) 0.05 % lotion Apply 1 application topically as needed. After showering    . DEXILANT 60 MG capsule TAKE 1 CAPSULE BY MOUTH ONCE DAILY (Patient taking differently: Take 60 mg by mouth daily. ) 90 capsule 0  . diphenhydrAMINE (BENADRYL) 25 MG tablet Take 25 mg by mouth every 8 (eight) hours as  needed (allergies).     . famotidine (PEPCID) 20 MG tablet Take 20 mg by mouth 2 (two) times daily.    [name] ketoconazole (NIZORAL) 2 % shampoo Apply 1 application topically once a week.     . lubiprostone (AMITIZA) 24 MCG capsule TAKE ONE CAPSULE TWICE A DAY WITH MEALS (Patient taking differently: Take 24 mcg by mouth 2 (two) times daily. ) 60 capsule 3  . memantine (NAMENDA) 10 MG tablet TAKE ONE TABLET BY MOUTH TWICE DAILY (Patient taking differently: Take 10 mg by mouth 2 (two) times daily. ) 180 tablet 2  . Multiple Vitamin (MULTIVITAMIN WITH MINERALS) TABS tablet Take 1 tablet by mouth daily.    [name] MYRBETRIQ 50 MG TB24 tablet TAKE 1 TABLET BY MOUTH DAILY (Patient taking differently: Take 50 mg by mouth every morning. ) 90 tablet 1  . polyethylene glycol (MIRALAX / GLYCOLAX) packet Take 17 g by mouth daily.    . solifenacin (VESICARE) 5 MG tablet Take 1 tablet (5 mg total) by mouth daily. 30 tablet 3  . triamcinolone (NASACORT) 55 MCG/ACT AERO nasal inhaler Place 1 spray into the nose as needed.     . venlafaxine XR (EFFEXOR-XR) 150 MG 24 hr capsule TAKE 1 CAPSULE BY MOUTH DAILY (Patient taking differently: Take 150 mg by mouth daily with breakfast. ) 90 capsule 1  . XALKORI 250 MG capsule TAKE 1 CAPSULE (250 MG TOTAL) BY MOUTH 2 TIMES DAILY. (Patient taking differently: Take 250 mg by mouth 2 (two) times daily. ) 60 capsule 0    Results for orders placed or performed during the hospital encounter of 10/09/18 (from the past 48 hour(s))  Comprehensive metabolic panel     Status: Abnormal   Collection Time: 10/09/18 10:56 PM  Result Value Ref Range   Sodium 136 135 - 145 mmol/L   Potassium 3.8 3.5 - 5.1 mmol/L   Chloride 104 98 - 111 mmol/L   CO2 25 22 - 32 mmol/L   Glucose, Bld 129 (H) 70 - 99 mg/dL   BUN 22 8 - 23 mg/dL   Creatinine, Ser 0.98 0.44 - 1.00 mg/dL   Calcium 7.6 (L) 8.9 - 10.3 mg/dL   Total Protein 5.5 (L) 6.5 - 8.1 g/dL   Albumin 2.8 (L) 3.5 - 5.0 g/dL   AST 28 15 - 41 U/L    ALT 37 0 - 44 U/L   Alkaline Phosphatase 153 (H) 38 - 126 U/L   Total Bilirubin 0.5 0.3 - 1.2 mg/dL   GFR calc non Af Amer 54 (L) >60 mL/min   GFR calc Af Amer >60 >60 mL/min   Anion gap 7 5 - 15    Comment: Performed at [facility], [address], [place], [place] [address]  CBC WITH DIFFERENTIAL     Status: Abnormal   Collection Time: 10/09/18  10:56 PM  Result Value Ref Range   WBC 9.0 4.0 - 10.5 K/uL   RBC 3.01 (L) 3.87 - 5.11 MIL/uL   Hemoglobin 9.5 (L) 12.0 - 15.0 g/dL   HCT 28.7 (L) 36.0 - 46.0 %   MCV 95.3 80.0 - 100.0 fL   MCH 31.6 26.0 - 34.0 pg   MCHC 33.1 30.0 - 36.0 g/dL   RDW 14.1 11.5 - 15.5 %   Platelets 333 150 - 400 K/uL   nRBC 0.0 0.0 - 0.2 %   Neutrophils Relative % 70 %   Neutro Abs 6.3 1.7 - 7.7 K/uL   Lymphocytes Relative 11 %   Lymphs Abs 1.0 0.7 - 4.0 K/uL   Monocytes Relative 13 %   Monocytes Absolute 1.2 (H) 0.1 - 1.0 K/uL   Eosinophils Relative 5 %   Eosinophils Absolute 0.5 0.0 - 0.5 K/uL   Basophils Relative 0 %   Basophils Absolute 0.0 0.0 - 0.1 K/uL   WBC Morphology MORPHOLOGY UNREMARKABLE    RBC Morphology MORPHOLOGY UNREMARKABLE    Smear Review Normal platelet morphology    Immature Granulocytes 1 %   Abs Immature Granulocytes 0.06 0.00 - 0.07 K/uL    Comment: Performed at [facility], [address] [place], [place] [address]  Lactic acid, plasma     Status: None   Collection Time: 10/09/18 10:56 PM  Result Value Ref Range   Lactic Acid, Venous 0.7 0.5 - 1.9 mmol/L    Comment: Performed at [facility], [address] [place], [place] [address]  SARS Coronavirus 2 (CEPHEID- Performed in [facility] hospital lab), Hosp Order     Status: None   Collection Time: 10/09/18 11:58 PM  Result Value Ref Range   SARS Coronavirus 2 NEGATIVE NEGATIVE    Comment: (NOTE) If result is NEGATIVE SARS-CoV-2 target nucleic acids are NOT DETECTED. The SARS-CoV-2 RNA is generally detectable in upper and lower   respiratory specimens during the acute phase of infection. The lowest  concentration of SARS-CoV-2 viral copies this assay can detect is 250  copies / mL. A negative result does not preclude SARS-CoV-2 infection  and should not be used as the sole basis for treatment or other  patient management decisions.  A negative result may occur with  improper specimen collection / handling, submission of specimen other  than nasopharyngeal swab, presence of viral mutation(s) within the  areas targeted by this assay, and inadequate number of viral copies  (<250 copies / mL). A negative result must be combined with clinical  observations, patient history, and epidemiological information. If result is POSITIVE SARS-CoV-2 target nucleic acids are DETECTED. The SARS-CoV-2 RNA is generally detectable in upper and lower  respiratory specimens dur ing the acute phase of infection.  Positive  results are indicative of active infection with SARS-CoV-2.  Clinical  correlation with patient history and other diagnostic information is  necessary to determine patient infection status.  Positive results do  not rule out bacterial infection or co-infection with other viruses. If result is PRESUMPTIVE POSTIVE SARS-CoV-2 nucleic acids MAY BE PRESENT.   A presumptive positive result was obtained on the submitted specimen  and confirmed on repeat testing.  While 2019 novel coronavirus  (SARS-CoV-2) nucleic acids may be present in the submitted sample  additional confirmatory testing may be necessary for epidemiological  and / or clinical management purposes  to differentiate between  SARS-CoV-2 and other Sarbecovirus currently known to infect humans.  If clinically indicated  additional testing with an alternate test  methodology [phone]) is advised. The SARS-CoV-2 RNA is generally  detectable in upper and lower respiratory sp ecimens during the acute  phase of infection. The expected result is Negative. Fact  Sheet for Patients:  StrictlyIdeas.no Fact Sheet for Healthcare Providers: BankingDealers.co.za This test is not yet approved or cleared by the [place] FDA and has been authorized for detection and/or diagnosis of SARS-CoV-2 by FDA under an Emergency Use Authorization (EUA).  This EUA will remain in effect (meaning this test can be used) for the duration of the COVID-19 declaration under Section 564(b)(1) of the Act, 21 U.S.C. section 360bbb-3(b)(1), unless the authorization is terminated or revoked sooner. Performed at [facility], [address] [place], [place] [address]    Dg Chest Portable 1 View  Result Date: 10/10/2018 CLINICAL DATA:  Fever EXAM: PORTABLE CHEST 1 VIEW COMPARISON:  08/23/2018 FINDINGS: The heart size and mediastinal contours are within normal limits. Both lungs are clear. Calcification projecting over the left apex is unchanged. The visualized skeletal structures are unremarkable. IMPRESSION: No active disease. Electronically Signed   By: Ulyses Jarred M.D.   On: 10/10/2018 01:05    Review of Systems  Constitutional: Positive for fever. Negative for chills.  HENT: Negative for sore throat and tinnitus.   Eyes: Negative for blurred vision and redness.  Respiratory: Positive for cough. Negative for shortness of breath.   Cardiovascular: Negative for chest pain, palpitations, orthopnea and PND.  Gastrointestinal: Negative for abdominal pain, diarrhea, nausea and vomiting.  Genitourinary: Negative for dysuria, frequency and urgency.  Musculoskeletal: Negative for joint pain and myalgias.  Skin: Negative for rash.       No lesions  Neurological: Negative for speech change, focal weakness and weakness.  Endo/Heme/Allergies: Does not bruise/bleed easily.       No temperature intolerance  Psychiatric/Behavioral: Negative for depression and suicidal ideas.    Blood pressure (!) 112/54, pulse (!) 53,  temperature 98.4 F (36.9 C), temperature source Oral, resp. rate 18, height 5\' 3"  (1.6 m), weight 68.9 kg, SpO2 93 %. Physical Exam  Vitals reviewed. Constitutional: She is oriented to person, place, and time. She appears well-developed and well-nourished. No distress.  HENT:  Head: Normocephalic and atraumatic.  Mouth/Throat: Oropharynx is clear and moist.  Eyes: Pupils are equal, round, and reactive to light. Conjunctivae and EOM are normal.  Neck: Normal range of motion. Neck supple. No JVD present. No tracheal deviation present. No thyromegaly present.  Cardiovascular: Normal rate, regular rhythm and normal heart sounds. Exam reveals no gallop and no friction rub.  No murmur heard. Respiratory: Effort normal and breath sounds normal.  GI: Soft. Bowel sounds are normal. She exhibits no distension. There is no abdominal tenderness.  Genitourinary:    Genitourinary Comments: Deferred   Musculoskeletal: Normal range of motion.        General: No edema.  Lymphadenopathy:    She has no cervical adenopathy.  Neurological: She is alert and oriented to person, place, and time. No cranial nerve deficit. She exhibits normal muscle tone.  Skin: Skin is warm and dry. No rash noted. No erythema.  Psychiatric: She has a normal mood and affect. Her behavior is normal. Judgment and thought content normal.     Assessment/Plan This is an 78 year old female admitted for fever. 1.  Fever: Currently afebrile but immunocompromise could potentially prevent lasting and definitive fever while undergoing chemotherapy.  Monitor for development of signs or symptoms of sepsis.  Follow  blood cultures.  Negative for novel coronavirus.  Continue azithromycin and ceftriaxone for now 2.  Breast cancer: Continue chemotherapy and anastrozole.  Etiology of fever may actually be hot flash secondary to anastrozole. 3.  Hyperlipidemia: Continue statin therapy 4.  Depression: Continue bupropion and Effexor 5.  Mild  cognitive impairment: Continue Namenda 6.  DVT prophylaxis: Lovenox 7.  GI prophylaxis: Dexilant The patient is a full code.  Time spent on admission orders and patient care approximately 45 minutes  Harrie Foreman, MD 10/10/2018, 5:09 AM

## 2018-10-10 NOTE — Consult Note (Signed)
Pharmacy Antibiotic Note  Monnie Gudgel is a 78 y.o. female admitted on 10/09/2018 with Fever.  Pharmacy has been consulted for Azithromycin and Rocephin dosing. Currently treating patient empirically until further evaluation and Blood cultures result.  Plan: 1) Azithromycin 500mg  IV Q24 hours  2) Rocephin 1g Q24 hours   Height: 5\' 3"  (160 cm) Weight: 154 lb 12.2 oz (70.2 kg) IBW/kg (Calculated) : 52.4  Temp (24hrs), Avg:98.8 F (37.1 C), Min:98.4 F (36.9 C), Max:99.1 F (37.3 C)  Recent Labs  Lab 10/09/18 2256  WBC 9.0  CREATININE 0.98  LATICACIDVEN 0.7    Estimated Creatinine Clearance: 43 mL/min (by C-G formula based on SCr of 0.98 mg/dL).    Allergies  Allergen Reactions  . Sulfa Antibiotics Rash    Antimicrobials this admission: Azithromycin 5/17 >>  Rocephin 5/17 >>   Dose adjustments this admission:   Microbiology results: 5/17 BCx: NGTD 5/17 COVID: negative  Thank you for allowing pharmacy to be a part of this patient's care.  Pearla Dubonnet, PharmD Clinical Pharmacist 10/10/2018 8:53 AM

## 2018-10-10 NOTE — Progress Notes (Signed)
[place] [facility] [place] NAME: Jamie Matthews    MR#:  [id number]  DATE OF BIRTH:  September 17, 1940  SUBJECTIVE:  CHIEF COMPLAINT:   Chief Complaint  Patient presents with  . Fever  . Weakness    No new complaints this morning.  No fevers since admission overnight.  Intermittent cough which is mostly nonproductive.  REVIEW OF SYSTEMS:  Review of Systems  Constitutional: Negative for chills.       Fevers which is resolved.  HENT: Negative for hearing loss and tinnitus.   Eyes: Negative for blurred vision.  Respiratory: Positive for cough. Negative for sputum production.   Cardiovascular: Negative for chest pain and palpitations.  Gastrointestinal: Negative for abdominal pain, heartburn, nausea and vomiting.  Genitourinary: Negative for dysuria and urgency.  Musculoskeletal: Negative for myalgias and neck pain.  Skin: Negative for rash.  Neurological: Negative for dizziness and headaches.  Psychiatric/Behavioral: Negative for depression and hallucinations.    DRUG ALLERGIES:   Allergies  Allergen Reactions  . Sulfa Antibiotics Rash   VITALS:  Blood pressure (!) 112/54, pulse (!) 53, temperature 98.4 F (36.9 C), temperature source Oral, resp. rate 18, height 5\' 3"  (1.6 m), weight 70.2 kg, SpO2 93 %. PHYSICAL EXAMINATION:   Physical Exam  Constitutional: She is oriented to person, place, and time. She appears well-developed.  HENT:  Head: Normocephalic and atraumatic.  Right Ear: External ear normal.  Eyes: Pupils are equal, round, and reactive to light. Conjunctivae are normal.  Neck: Normal range of motion. Neck supple. No tracheal deviation present.  Cardiovascular: Normal rate, regular rhythm and normal heart sounds.  Respiratory: Effort normal and breath sounds normal. No respiratory distress.  GI: Soft. There is no abdominal tenderness.  Musculoskeletal: Normal range of motion.        General: No edema.  Neurological:  She is alert and oriented to person, place, and time. No cranial nerve deficit.  Skin: Skin is warm. She is not diaphoretic. No erythema.  Psychiatric: She has a normal mood and affect. Her behavior is normal.   LABORATORY PANEL:  Female CBC Recent Labs  Lab 10/09/18 2256  WBC 9.0  HGB 9.5*  HCT 28.7*  PLT 333   ------------------------------------------------------------------------------------------------------------------ Chemistries  Recent Labs  Lab 10/09/18 2256  NA 136  K 3.8  CL 104  CO2 25  GLUCOSE 129*  BUN 22  CREATININE 0.98  CALCIUM 7.6*  AST 28  ALT 37  ALKPHOS 153*  BILITOT 0.5   RADIOLOGY:  Dg Chest Portable 1 View  Result Date: 10/10/2018 CLINICAL DATA:  Fever EXAM: PORTABLE CHEST 1 VIEW COMPARISON:  08/23/2018 FINDINGS: The heart size and mediastinal contours are within normal limits. Both lungs are clear. Calcification projecting over the left apex is unchanged. The visualized skeletal structures are unremarkable. IMPRESSION: No active disease. Electronically Signed   By: Ulyses Jarred M.D.   On: 10/10/2018 01:05   ASSESSMENT AND PLAN:   This is an 78 year old female admitted for fever.  1.  Fever: Currently afebrile Etiology not yet found.  Chest x-ray negative.  We will follow-up on results of urinalysis and blood culture Patient already placed on empiric antibiotics with azithromycin and Rocephin pending results of cultures especially given immunocompromised state IV fluid hydration.  Clinically patient does not appear septic.  Lactic acid level was 0.7 Coronavirus test negative.  COVID-19 ruled out   2.  Breast cancer: Continue chemotherapy and anastrozole.  Etiology of fever may actually  be hot flash secondary to anastrozole.  3.  Hyperlipidemia: Continue statin therapy  4.  Depression: Continue bupropion and Effexor  5.  Mild cognitive impairment: Continue Namenda  6.  DVT prophylaxis: Lovenox  All the records are reviewed and case  discussed with Care Management/Social Worker. Management plans discussed with the patient, family and they are in agreement.  CODE STATUS: Full Code  TOTAL TIME TAKING CARE OF THIS PATIENT: 37 minutes.   More than 50% of the time was spent in counseling/coordination of care: YES  POSSIBLE D/C IN 2 DAYS, DEPENDING ON CLINICAL CONDITION.   Staton Markey M.D on 10/10/2018 at 12:19 PM  Between 7am to 6pm - Pager - [phone]  After 6pm go to [email or web address] - Proofreader  [facility]  Office  [phone]  CC: Primary care physician; Jamie Passy, MD  Note: This dictation was prepared with Dragon dictation along with smaller phrase technology. Any transcriptional errors that result from this process are unintentional.

## 2018-10-11 DIAGNOSIS — E785 Hyperlipidemia, unspecified: Secondary | ICD-10-CM | POA: Diagnosis not present

## 2018-10-11 DIAGNOSIS — R509 Fever, unspecified: Secondary | ICD-10-CM | POA: Diagnosis not present

## 2018-10-11 DIAGNOSIS — C50919 Malignant neoplasm of unspecified site of unspecified female breast: Secondary | ICD-10-CM | POA: Diagnosis not present

## 2018-10-11 DIAGNOSIS — F329 Major depressive disorder, single episode, unspecified: Secondary | ICD-10-CM | POA: Diagnosis not present

## 2018-10-11 LAB — CBC
HCT: 28.3 % — ABNORMAL LOW (ref 36.0–46.0)
Hemoglobin: 9 g/dL — ABNORMAL LOW (ref 12.0–15.0)
MCH: 31 pg (ref 26.0–34.0)
MCHC: 31.8 g/dL (ref 30.0–36.0)
MCV: 97.6 fL (ref 80.0–100.0)
Platelets: 257 10*3/uL (ref 150–400)
RBC: 2.9 MIL/uL — ABNORMAL LOW (ref 3.87–5.11)
RDW: 14.2 % (ref 11.5–15.5)
WBC: 6.7 10*3/uL (ref 4.0–10.5)
nRBC: 0 % (ref 0.0–0.2)

## 2018-10-11 LAB — BASIC METABOLIC PANEL
Anion gap: 6 (ref 5–15)
BUN: 15 mg/dL (ref 8–23)
CO2: 25 mmol/L (ref 22–32)
Calcium: 7.3 mg/dL — ABNORMAL LOW (ref 8.9–10.3)
Chloride: 111 mmol/L (ref 98–111)
Creatinine, Ser: 0.81 mg/dL (ref 0.44–1.00)
GFR calc Af Amer: >60 mL/min (ref >60)
GFR calc non Af Amer: >60 mL/min (ref >60)
Glucose, Bld: 118 mg/dL — ABNORMAL HIGH (ref 70–99)
Potassium: 3.6 mmol/L (ref 3.5–5.1)
Sodium: 142 mmol/L (ref 135–145)

## 2018-10-11 LAB — MAGNESIUM: Magnesium: 2.1 mg/dL (ref 1.7–2.4)

## 2018-10-11 MED ORDER — DOCUSATE SODIUM 100 MG PO CAPS: 100.0000 mg | ORAL_CAPSULE | Freq: Two times a day (BID) | ORAL | 0 refills | Status: DC

## 2018-10-11 MED ORDER — POLYETHYLENE GLYCOL 3350 17 G PO PACK
17.0000 g | PACK | Freq: Once | ORAL | Status: AC
  Administered 2018-10-11: 17 g via ORAL
  Filled 2018-10-11: qty 1

## 2018-10-11 MED ORDER — BENZONATATE 100 MG PO CAPS: 100.0000 mg | ORAL_CAPSULE | Freq: Three times a day (TID) | ORAL | 0 refills | Status: DC | PRN

## 2018-10-11 NOTE — Progress Notes (Signed)
Soap suds enema just administered

## 2018-10-11 NOTE — Discharge Summary (Signed)
[facility] NAME: Jamie Matthews    MR#:  [id number]  DATE OF BIRTH:  May 27, 1940  DATE OF ADMISSION:  10/09/2018   ADMITTING PHYSICIAN: Harrie Foreman, MD  DATE OF DISCHARGE: 10/11/2018  PRIMARY CARE PHYSICIAN: [name], MD   ADMISSION DIAGNOSIS:  Fever cancer pt DISCHARGE DIAGNOSIS:  Active Problems:   Fever  SECONDARY DIAGNOSIS:   Past Medical History:  Diagnosis Date   AKI (acute kidney injury) ([place]) 08/17/2018   Anxiety    Arthritis    Belching    Bladder disorder    OVERACTIVE   Bowel dysfunction    BLOCKAGE   Cancer ([place])    breast   Constipation    Depression    Diverticulitis    Fibromyalgia    GERD (gastroesophageal reflux disease)    Hyperlipidemia    IBS (irritable bowel syndrome)    Internal hemorrhoids    Memory deficits    Murmur    asymptomatic   Pneumonia 11/18/12   Urinary incontinence    Vertigo    HOSPITAL COURSE:  Chief complaint; fever  Chief Complaint: Fever HPI: The patient with past medical history of hyperlipidemia who is currently undergoing chemotherapy for breast cancer presented to the emergency department complaining of fever.  The patient reports temperature of 101 F at home.  She also admitted to nonproductive intermittent cough.  No travel risk factors or known contact with individuals with exposure to novel coronavirus.  Laboratory evaluation was unremarkable but given the patient's immunocompromise emergency department staff obtained blood cultures and dose antibiotics prior to calling the hospitalist service for admission.  Please refer to H&P dictated for further details.  Hospital course; 1. Fever: Likely acute viral illness which is resolved.   Patient has remained afebrile.  Chest x-ray was negative.  Urinalysis negative.  Blood cultures with no growth.  Patient was placed empirically on IV antibiotics with azithromycin and Rocephin on  admission due to immunocompromise state.  Clinically no evidence of infectious process at this time.  Was adequately hydrated with IV fluids. Clinically patient does not appear septic.  Lactic acid level was 0.7 Coronavirus test negative.  COVID-19 ruled out . Patient clinically stable and wishes to be discharged home today.    2. Breast cancer: Continue chemotherapy and anastrozole. Etiology of fever may actually be hot flash secondary to anastrozole.  3. Hyperlipidemia: Continue statin therapy  4. Depression: Continue bupropion and Effexor  5. Mild cognitive impairment: Continue Namenda  6.  Mild bilateral lower extremity edema. 2D echocardiogram was previously scheduled to be done as outpatient.  This was done during this admission with normal ejection fraction of 55 to 60% and mild aortic stenosis.  Mild edema may be related to side effect of anastrozole.  Primary care physician to continue monitoring.  DISCHARGE CONDITIONS:  Stable CONSULTS OBTAINED:   DRUG ALLERGIES:   Allergies  Allergen Reactions   Sulfa Antibiotics Rash   DISCHARGE MEDICATIONS:   Allergies as of 10/11/2018      Reactions   Sulfa Antibiotics Rash      Medication List    TAKE these medications   acetaminophen 325 MG tablet Commonly known as:  TYLENOL Take 325 mg by mouth every 6 (six) hours as needed (for pain).   anastrozole 1 MG tablet Commonly known as:  ARIMIDEX Take 1 tablet (1 mg total) by mouth daily.   atorvastatin 20 MG tablet Commonly known as:  LIPITOR  TAKE 1 TABLET DAILY What changed:    how much to take  how to take this  when to take this  additional instructions   benzonatate 100 MG capsule Commonly known as:  TESSALON Take 1 capsule (100 mg total) by mouth 3 (three) times daily as needed for cough.   buPROPion 150 MG 24 hr tablet Commonly known as:  WELLBUTRIN XL TAKE ONE TABLET BY MOUTH EVERY DAY   cyclobenzaprine 5 MG tablet Commonly known as:   FLEXERIL Take 1 tablet (5 mg total) by mouth 3 (three) times daily as needed for muscle spasms.   desonide 0.05 % lotion Commonly known as:  DESOWEN Apply 1 application topically as needed. After showering   Dexilant 60 MG capsule Generic drug:  dexlansoprazole TAKE 1 CAPSULE BY MOUTH ONCE DAILY What changed:  how much to take   diphenhydrAMINE 25 MG tablet Commonly known as:  BENADRYL Take 25 mg by mouth every 8 (eight) hours as needed (allergies).   docusate sodium 100 MG capsule Commonly known as:  COLACE Take 1 capsule (100 mg total) by mouth 2 (two) times daily.   famotidine 20 MG tablet Commonly known as:  PEPCID Take 20 mg by mouth 2 (two) times daily.   ketoconazole 2 % shampoo Commonly known as:  NIZORAL Apply 1 application topically once a week.   lubiprostone 24 MCG capsule Commonly known as:  Amitiza TAKE ONE CAPSULE TWICE A DAY WITH MEALS What changed:    how much to take  how to take this  when to take this  additional instructions   memantine 10 MG tablet Commonly known as:  NAMENDA TAKE ONE TABLET BY MOUTH TWICE DAILY   multivitamin with minerals Tabs tablet Take 1 tablet by mouth daily.   Myrbetriq 50 MG Tb24 tablet Generic drug:  mirabegron ER TAKE 1 TABLET BY MOUTH DAILY What changed:    how much to take  when to take this   polyethylene glycol 17 g packet Commonly known as:  MIRALAX / GLYCOLAX Take 17 g by mouth daily.   solifenacin 5 MG tablet Commonly known as:  VESICARE Take 1 tablet (5 mg total) by mouth daily.   triamcinolone 55 MCG/ACT Aero nasal inhaler Commonly known as:  NASACORT Place 1 spray into the nose as needed.   venlafaxine XR 150 MG 24 hr capsule Commonly known as:  EFFEXOR-XR TAKE 1 CAPSULE BY MOUTH DAILY What changed:  when to take this   Xalkori 250 MG capsule Generic drug:  crizotinib TAKE 1 CAPSULE (250 MG TOTAL) BY MOUTH 2 TIMES DAILY. What changed:  See the new instructions.         DISCHARGE INSTRUCTIONS:   DIET:  Cardiac diet DISCHARGE CONDITION:  Stable ACTIVITY:  Activity as tolerated OXYGEN:  Home Oxygen: No.  Oxygen Delivery: room air DISCHARGE LOCATION:  home   If you experience worsening of your admission symptoms, develop shortness of breath, life threatening emergency, suicidal or homicidal thoughts you must seek medical attention immediately by calling 911 or calling your MD immediately  if symptoms less severe.  You Must read complete instructions/literature along with all the possible adverse reactions/side effects for all the Medicines you take and that have been prescribed to you. Take any new Medicines after you have completely understood and accpet all the possible adverse reactions/side effects.   Please note  You were cared for by a hospitalist during your hospital stay. If you have any questions about your discharge medications or  the care you received while you were in the hospital after you are discharged, you can call the unit and asked to speak with the hospitalist on call if the hospitalist that took care of you is not available. Once you are discharged, your primary care physician will handle any further medical issues. Please note that NO REFILLS for any discharge medications will be authorized once you are discharged, as it is imperative that you return to your primary care physician (or establish a relationship with a primary care physician if you do not have one) for your aftercare needs so that they can reassess your need for medications and monitor your lab values.    On the day of Discharge:  VITAL SIGNS:  Blood pressure (!) 131/58, pulse 61, temperature 98.4 F (36.9 C), resp. rate 20, height 5\' 3"  (1.6 m), weight 70.1 kg, SpO2 92 %. PHYSICAL EXAMINATION:  GENERAL:  78 y.o.-year-old patient lying in the bed with no acute distress.  EYES: Pupils equal, round, reactive to light and accommodation. No scleral icterus.  Extraocular muscles intact.  HEENT: Head atraumatic, normocephalic. Oropharynx and nasopharynx clear.  NECK:  Supple, no jugular venous distention. No thyroid enlargement, no tenderness.  LUNGS: Normal breath sounds bilaterally, no wheezing, rales,rhonchi or crepitation. No use of accessory muscles of respiration.  CARDIOVASCULAR: S1, S2 normal. No murmurs, rubs, or gallops.  ABDOMEN: Soft, non-tender, non-distended. Bowel sounds present. No organomegaly or mass.  EXTREMITIES: No pedal edema, cyanosis, or clubbing.  NEUROLOGIC: Cranial nerves II through XII are intact. Muscle strength 5/5 in all extremities. Sensation intact. Gait not checked.  PSYCHIATRIC: The patient is alert and oriented x 3.  SKIN: No obvious rash, lesion, or ulcer.  DATA REVIEW:   CBC Recent Labs  Lab 10/11/18 0514  WBC 6.7  HGB 9.0*  HCT 28.3*  PLT 257    Chemistries  Recent Labs  Lab 10/09/18 2256 10/11/18 0514  NA 136 142  K 3.8 3.6  CL 104 111  CO2 25 25  GLUCOSE 129* 118*  BUN 22 15  CREATININE 0.98 0.81  CALCIUM 7.6* 7.3*  MG  --  2.1  AST 28  --   ALT 37  --   ALKPHOS 153*  --   BILITOT 0.5  --      Microbiology Results  Results for orders placed or performed during the hospital encounter of 10/09/18  Blood Culture (routine x 2)     Status: None (Preliminary result)   Collection Time: 10/09/18 11:51 PM  Result Value Ref Range Status   Specimen Description BLOOD RIGHT ANTECUBITAL  Final   Special Requests   Final    BOTTLES DRAWN AEROBIC AND ANAEROBIC Blood Culture adequate volume   Culture   Final    NO GROWTH 1 DAY Performed at [facility], [address], [place], [place] [address]    Report Status PENDING  Incomplete  Blood Culture (routine x 2)     Status: None (Preliminary result)   Collection Time: 10/09/18 11:57 PM  Result Value Ref Range Status   Specimen Description BLOOD RIGHT FOREARM  Final   Special Requests   Final    BOTTLES DRAWN AEROBIC AND ANAEROBIC  Blood Culture adequate volume   Culture   Final    NO GROWTH 1 DAY Performed at [facility], [address], [place], [place] [address]    Report Status PENDING  Incomplete  SARS Coronavirus 2 (CEPHEID- Performed in [place] hospital lab), [facility]  Status: None   Collection Time: 10/09/18 11:58 PM  Result Value Ref Range Status   SARS Coronavirus 2 NEGATIVE NEGATIVE Final    Comment: (NOTE) If result is NEGATIVE SARS-CoV-2 target nucleic acids are NOT DETECTED. The SARS-CoV-2 RNA is generally detectable in upper and lower  respiratory specimens during the acute phase of infection. The lowest  concentration of SARS-CoV-2 viral copies this assay can detect is 250  copies / mL. A negative result does not preclude SARS-CoV-2 infection  and should not be used as the sole basis for treatment or other  patient management decisions.  A negative result may occur with  improper specimen collection / handling, submission of specimen other  than nasopharyngeal swab, presence of viral mutation(s) within the  areas targeted by this assay, and inadequate number of viral copies  (<250 copies / mL). A negative result must be combined with clinical  observations, patient history, and epidemiological information. If result is POSITIVE SARS-CoV-2 target nucleic acids are DETECTED. The SARS-CoV-2 RNA is generally detectable in upper and lower  respiratory specimens dur ing the acute phase of infection.  Positive  results are indicative of active infection with SARS-CoV-2.  Clinical  correlation with patient history and other diagnostic information is  necessary to determine patient infection status.  Positive results do  not rule out bacterial infection or co-infection with other viruses. If result is PRESUMPTIVE POSTIVE SARS-CoV-2 nucleic acids MAY BE PRESENT.   A presumptive positive result was obtained on the submitted specimen  and confirmed on repeat testing.  While 2019 novel  coronavirus  (SARS-CoV-2) nucleic acids may be present in the submitted sample  additional confirmatory testing may be necessary for epidemiological  and / or clinical management purposes  to differentiate between  SARS-CoV-2 and other Sarbecovirus currently known to infect humans.  If clinically indicated additional testing with an alternate test  methodology [phone]) is advised. The SARS-CoV-2 RNA is generally  detectable in upper and lower respiratory sp ecimens during the acute  phase of infection. The expected result is Negative. Fact Sheet for Patients:  StrictlyIdeas.no Fact Sheet for Healthcare Providers: BankingDealers.co.za This test is not yet approved or cleared by the [place] FDA and has been authorized for detection and/or diagnosis of SARS-CoV-2 by FDA under an Emergency Use Authorization (EUA).  This EUA will remain in effect (meaning this test can be used) for the duration of the COVID-19 declaration under Section 564(b)(1) of the Act, 21 U.S.C. section 360bbb-3(b)(1), unless the authorization is terminated or revoked sooner. Performed at [facility], [address], [place],  [address]     RADIOLOGY:  No results found.   Management plans discussed with the patient, family and they are in agreement.  CODE STATUS: Full Code   TOTAL TIME TAKING CARE OF THIS PATIENT: 37 minutes.    Zacory Fiola M.D on 10/11/2018 at 10:29 AM  Between 7am to 6pm - Pager - [phone]  After 6pm go to [email or web address] - Proofreader  [facility]  Office  [phone]  CC: Primary care physician; [name], MD   Note: This dictation was prepared with Dragon dictation along with smaller phrase technology. Any transcriptional errors that result from this process are unintentional.

## 2018-10-11 NOTE — Progress Notes (Signed)
Discharge instructions reviewed with patient. She verbalized understanding. IVs removed.Vitals stable.  Patient waiting on husband to get here to take her home.

## 2018-10-12 ENCOUNTER — Encounter: Payer: Self-pay | Admitting: Family Medicine

## 2018-10-12 ENCOUNTER — Ambulatory Visit (INDEPENDENT_AMBULATORY_CARE_PROVIDER_SITE_OTHER): Payer: Medicare Other | Admitting: Family Medicine

## 2018-10-12 VITALS — Temp 97.9°F

## 2018-10-12 DIAGNOSIS — K5909 Other constipation: Secondary | ICD-10-CM

## 2018-10-12 DIAGNOSIS — R2241 Localized swelling, mass and lump, right lower limb: Secondary | ICD-10-CM

## 2018-10-12 DIAGNOSIS — K219 Gastro-esophageal reflux disease without esophagitis: Secondary | ICD-10-CM | POA: Diagnosis not present

## 2018-10-12 DIAGNOSIS — R630 Anorexia: Secondary | ICD-10-CM

## 2018-10-12 NOTE — Assessment & Plan Note (Signed)
Discussed that dexilant could be causing leg swell, but pt with more reflux symptoms and would like to continue at this time.

## 2018-10-12 NOTE — Progress Notes (Signed)
I connected with [name] on 10/12/18 at  2:40 PM EDT by video and verified that I am speaking with the correct person using two identifiers.   I discussed the limitations, risks, security and privacy concerns of performing an evaluation and management service by video and the availability of in person appointments. I also discussed with the patient that there may be a patient responsible charge related to this service. The patient expressed understanding and agreed to proceed.  Patient location: Home Provider Location: [place] [place] Participants: [name] and Haynes Hoehn [place]   Subjective:     Jamie Matthews is a 78 y.o. female presenting for Hospitalization Follow-up (went to the hospital for fever and this has resolved.)     HPI   #hospital f/u - for fever - symptoms improved - feeling pretty good currently -   #leg swelling - started before her cancer medication - echo reassuring - told it might be a medication   #low hunger - feels constipated - did have to take several medications in the hospital - when extremely constipated needs more help - took colace, miralax, prune juice, enema - already takes miralax daily - and also prescribed colace    Review of Systems  Constitutional: Positive for appetite change. Negative for chills and fever.  Cardiovascular: Positive for leg swelling.  Gastrointestinal: Positive for constipation.    5/16-5/18/2020: Admission - fever - suspected viral illness with symptoms resolved and work-up negative. May have been due to anastrozole for Breast cancer. ECHO done due to edema - with normal EF and mld aortic stenosis  Social History   Tobacco Use  Smoking Status Never Smoker  Smokeless Tobacco Never Used        Objective:   BP Readings from Last 3 Encounters:  10/11/18 (!) 142/60  09/27/18 (!) 100/57  09/20/18 108/64   Wt Readings from Last 3 Encounters:   10/11/18 154 lb 9.4 oz (70.1 kg)  09/20/18 143 lb (64.9 kg)  08/23/18 157 lb 1.6 oz (71.3 kg)   Temp 97.9 F (36.6 C) Comment: per patient   Physical Exam  Could not do video visit Speaking in complete sentences, no respiratory distress Clear sentences Normal mood        Assessment & Plan:   Problem List Items Addressed This Visit      Digestive   GERD (gastroesophageal reflux disease) - Primary    Discussed that dexilant could be causing leg swell, but pt with more reflux symptoms and would like to continue at this time.       Chronic constipation    Advised titrating miralax to get regular soft stools - can take up to 3 times per day        Other   Decreased appetite    Chronic issue, but weight stable. Suspect that GERD and constipation could be contributing. Will continue treatment for those and monitor weight.       Localized swelling of right lower leg    Negative [place] for DVT and now with reassuring ECHO from the hospital. Discussed that anastrozole and dexilant could be contributing but patient would like to continue both medications. Advised trial of OTC compression socks. Can do medical ones if symptoms improve and pt tolerates.         Interactive audio and video telecommunications were attempted between this provider and patient, however failed, due to patient having technical difficulties OR patient did not have access to video  capability.  We continued and completed visit with audio only.   Time: 17 minutes was spent speaking with patient.   Return if symptoms worsen or fail to improve.  [name], MD

## 2018-10-12 NOTE — Assessment & Plan Note (Signed)
Advised titrating miralax to get regular soft stools - can take up to 3 times per day

## 2018-10-12 NOTE — Assessment & Plan Note (Signed)
Negative [place] for DVT and now with reassuring ECHO from the hospital. Discussed that anastrozole and dexilant could be contributing but patient would like to continue both medications. Advised trial of OTC compression socks. Can do medical ones if symptoms improve and pt tolerates.

## 2018-10-12 NOTE — Assessment & Plan Note (Signed)
Chronic issue, but weight stable. Suspect that GERD and constipation could be contributing. Will continue treatment for those and monitor weight.

## 2018-10-13 ENCOUNTER — Ambulatory Visit: Payer: Medicare Other

## 2018-10-14 ENCOUNTER — Telehealth: Payer: Self-pay

## 2018-10-14 ENCOUNTER — Telehealth: Payer: Self-pay | Admitting: Family Medicine

## 2018-10-14 DIAGNOSIS — N3946 Mixed incontinence: Secondary | ICD-10-CM

## 2018-10-14 MED ORDER — BENZONATATE 100 MG PO CAPS: 100.0000 mg | ORAL_CAPSULE | Freq: Three times a day (TID) | ORAL | 0 refills | Status: DC | PRN

## 2018-10-14 NOTE — Addendum Note (Signed)
Addended by: Kris Mouton on: 10/14/2018 02:03 PM   Modules accepted: Orders

## 2018-10-14 NOTE — Telephone Encounter (Signed)
First RX was printed for some reason. Re ordered and sent it in to the pharmacy. Left message for patient to call back to advise that is has been done.

## 2018-10-14 NOTE — Telephone Encounter (Signed)
Refill sent to pharmacy.   

## 2018-10-14 NOTE — Telephone Encounter (Signed)
When I spoke with patient before reviewing her medications. She said she takes Benzonatate as needed for cough.

## 2018-10-14 NOTE — Telephone Encounter (Signed)
Copied from [place] [phone]. Topic: Referral - Request for Referral >> Oct 13, 2018  1:02 PM Erick Blinks wrote: Has patient seen PCP for this complaint? No *If NO, is insurance requiring patient see PCP for this issue before PCP can refer them? Referral for which specialty: Urology  Preferred provider/office:  Reason for referral: complications. Requesting referral from Dr. Deborra Medina. Best Contact: [phone]

## 2018-10-14 NOTE — Telephone Encounter (Signed)
Patient called in requesting a refill  She would like to know if Dr Einar Pheasant could send this in for her  [place] PHONE [phone]    [place]

## 2018-10-15 ENCOUNTER — Ambulatory Visit: Payer: Self-pay

## 2018-10-15 LAB — CULTURE, BLOOD (ROUTINE X 2)
Culture: NO GROWTH
Culture: NO GROWTH
Special Requests: ADEQUATE
Special Requests: ADEQUATE

## 2018-10-15 NOTE — Telephone Encounter (Signed)
She stated she is having problem with incontinence

## 2018-10-15 NOTE — Telephone Encounter (Signed)
Patient advised.

## 2018-10-15 NOTE — Telephone Encounter (Signed)
Please review. Does patient need a visit?

## 2018-10-15 NOTE — Telephone Encounter (Signed)
I spoke with pt and explained a note from [facility] was sent to out office late yesterday; pt said she does not consider Dr Deborra Medina her doctor now. I asked pt why she was needing an appt with urology. Pt said she could not discuss it now and request cb in one hr. (11:15). FYI to [place].

## 2018-10-15 NOTE — Telephone Encounter (Signed)
Called to speak with patient. She will call me back. Whoever answers if I am not available please see what is the reason for urology referral and we can ask Dr. Einar Pheasant if she can order that without an appointment since she just did an appointment with patient this week.

## 2018-10-15 NOTE — Telephone Encounter (Signed)
Returned call to patient who was very upset stating that she could not talk to me.  She had only called to request a Urology consult from Dr Einar Pheasant.  She did not inform me of any of her symptoms. She remained upset and was unable to talk.  Answer Assessment - Initial Assessment Questions 1. REASON FOR CALL or QUESTION: "What is your reason for calling today?" or "How can I best help you?" or "What question do you have that I can help answer?"     Pt is requesting referral to urology  Protocols used: INFORMATION ONLY CALL-A-AH

## 2018-10-19 ENCOUNTER — Ambulatory Visit
Admission: RE | Admit: 2018-10-19 | Discharge: 2018-10-19 | Disposition: A | Discharge status: Home / Self Care | Payer: Medicare Other | Source: Ambulatory Visit | Attending: Oncology | Admitting: Oncology

## 2018-10-19 ENCOUNTER — Ambulatory Visit: Payer: Medicare Other | Admitting: Family Medicine

## 2018-10-19 ENCOUNTER — Other Ambulatory Visit: Payer: Self-pay

## 2018-10-19 DIAGNOSIS — C50912 Malignant neoplasm of unspecified site of left female breast: Secondary | ICD-10-CM | POA: Diagnosis not present

## 2018-10-19 DIAGNOSIS — R918 Other nonspecific abnormal finding of lung field: Secondary | ICD-10-CM | POA: Diagnosis not present

## 2018-10-19 DIAGNOSIS — C3490 Malignant neoplasm of unspecified part of unspecified bronchus or lung: Secondary | ICD-10-CM | POA: Diagnosis not present

## 2018-10-19 MED ORDER — IOHEXOL 300 MG/ML  SOLN
75.0000 mL | Freq: Once | INTRAMUSCULAR | Status: AC | PRN
  Administered 2018-10-19: 14:00:00 75 mL via INTRAVENOUS

## 2018-10-19 NOTE — Telephone Encounter (Signed)
See 10/14/18 phone note. Pt has been contacted already today by Anastasiya CMA. FYI to Dr Einar Pheasant.

## 2018-10-19 NOTE — Telephone Encounter (Signed)
We have not yet discussed this, but it is on her problem list. Will refer for further discussion.   Jamie Matthews

## 2018-10-19 NOTE — Addendum Note (Signed)
Addended by: [name] on: 10/19/2018 07:59 AM   Modules accepted: Orders

## 2018-10-19 NOTE — Telephone Encounter (Signed)
Patient advised referral was put in and referral coordinator will call to discuss

## 2018-10-20 ENCOUNTER — Other Ambulatory Visit: Payer: Self-pay

## 2018-10-21 ENCOUNTER — Encounter: Payer: Self-pay | Admitting: Oncology

## 2018-10-21 ENCOUNTER — Inpatient Hospital Stay (HOSPITAL_BASED_OUTPATIENT_CLINIC_OR_DEPARTMENT_OTHER): Payer: Medicare Other | Admitting: Oncology

## 2018-10-21 ENCOUNTER — Other Ambulatory Visit: Payer: Self-pay

## 2018-10-21 ENCOUNTER — Inpatient Hospital Stay: Payer: Medicare Other

## 2018-10-21 VITALS — BP 120/59 | HR 63 | Temp 97.6°F | Resp 18 | Wt 157.8 lb

## 2018-10-21 DIAGNOSIS — Z170 Estrogen receptor positive status [ER+]: Secondary | ICD-10-CM | POA: Diagnosis not present

## 2018-10-21 DIAGNOSIS — R102 Pelvic and perineal pain: Secondary | ICD-10-CM | POA: Diagnosis not present

## 2018-10-21 DIAGNOSIS — Z79811 Long term (current) use of aromatase inhibitors: Secondary | ICD-10-CM | POA: Diagnosis not present

## 2018-10-21 DIAGNOSIS — Z7189 Other specified counseling: Secondary | ICD-10-CM

## 2018-10-21 DIAGNOSIS — C3490 Malignant neoplasm of unspecified part of unspecified bronchus or lung: Secondary | ICD-10-CM

## 2018-10-21 DIAGNOSIS — E785 Hyperlipidemia, unspecified: Secondary | ICD-10-CM | POA: Diagnosis not present

## 2018-10-21 DIAGNOSIS — Z803 Family history of malignant neoplasm of breast: Secondary | ICD-10-CM

## 2018-10-21 DIAGNOSIS — Z79899 Other long term (current) drug therapy: Secondary | ICD-10-CM | POA: Diagnosis not present

## 2018-10-21 DIAGNOSIS — C50912 Malignant neoplasm of unspecified site of left female breast: Secondary | ICD-10-CM

## 2018-10-21 DIAGNOSIS — C3412 Malignant neoplasm of upper lobe, left bronchus or lung: Secondary | ICD-10-CM | POA: Diagnosis not present

## 2018-10-21 DIAGNOSIS — R600 Localized edema: Secondary | ICD-10-CM | POA: Diagnosis not present

## 2018-10-21 DIAGNOSIS — R7989 Other specified abnormal findings of blood chemistry: Secondary | ICD-10-CM

## 2018-10-21 DIAGNOSIS — N179 Acute kidney failure, unspecified: Secondary | ICD-10-CM

## 2018-10-21 DIAGNOSIS — M8580 Other specified disorders of bone density and structure, unspecified site: Secondary | ICD-10-CM

## 2018-10-21 DIAGNOSIS — Z9181 History of falling: Secondary | ICD-10-CM | POA: Diagnosis not present

## 2018-10-21 DIAGNOSIS — Z8249 Family history of ischemic heart disease and other diseases of the circulatory system: Secondary | ICD-10-CM

## 2018-10-21 DIAGNOSIS — S32509S Unspecified fracture of unspecified pubis, sequela: Secondary | ICD-10-CM

## 2018-10-21 LAB — COMPREHENSIVE METABOLIC PANEL
ALT: 34 U/L (ref 0–44)
AST: 32 U/L (ref 15–41)
Albumin: 3 g/dL — ABNORMAL LOW (ref 3.5–5.0)
Alkaline Phosphatase: 128 U/L — ABNORMAL HIGH (ref 38–126)
Anion gap: 6 (ref 5–15)
BUN: 25 mg/dL — ABNORMAL HIGH (ref 8–23)
CO2: 26 mmol/L (ref 22–32)
Calcium: 7.8 mg/dL — ABNORMAL LOW (ref 8.9–10.3)
Chloride: 107 mmol/L (ref 98–111)
Creatinine, Ser: 1.16 mg/dL — ABNORMAL HIGH (ref 0.44–1.00)
GFR calc Af Amer: 51 mL/min — ABNORMAL LOW (ref >60)
GFR calc non Af Amer: 44 mL/min — ABNORMAL LOW (ref >60)
Glucose, Bld: 135 mg/dL — ABNORMAL HIGH (ref 70–99)
Potassium: 4.5 mmol/L (ref 3.5–5.1)
Sodium: 139 mmol/L (ref 135–145)
Total Bilirubin: 0.5 mg/dL (ref 0.3–1.2)
Total Protein: 5.8 g/dL — ABNORMAL LOW (ref 6.5–8.1)

## 2018-10-21 LAB — CBC WITH DIFFERENTIAL/PLATELET
Abs Immature Granulocytes: 0.09 10*3/uL — ABNORMAL HIGH (ref 0.00–0.07)
Basophils Absolute: 0.1 10*3/uL (ref 0.0–0.1)
Basophils Relative: 1 %
Eosinophils Absolute: 0.5 10*3/uL (ref 0.0–0.5)
Eosinophils Relative: 6 %
HCT: 30.5 % — ABNORMAL LOW (ref 36.0–46.0)
Hemoglobin: 9.7 g/dL — ABNORMAL LOW (ref 12.0–15.0)
Immature Granulocytes: 1 %
Lymphocytes Relative: 15 %
Lymphs Abs: 1.2 10*3/uL (ref 0.7–4.0)
MCH: 31.3 pg (ref 26.0–34.0)
MCHC: 31.8 g/dL (ref 30.0–36.0)
MCV: 98.4 fL (ref 80.0–100.0)
Monocytes Absolute: 1.3 10*3/uL — ABNORMAL HIGH (ref 0.1–1.0)
Monocytes Relative: 16 %
Neutro Abs: 5 10*3/uL (ref 1.7–7.7)
Neutrophils Relative %: 61 %
Platelets: 303 10*3/uL (ref 150–400)
RBC: 3.1 MIL/uL — ABNORMAL LOW (ref 3.87–5.11)
RDW: 14.6 % (ref 11.5–15.5)
WBC: 8.2 10*3/uL (ref 4.0–10.5)
nRBC: 0 % (ref 0.0–0.2)

## 2018-10-21 NOTE — Progress Notes (Signed)
Patient here for follow up. Pt with edema to bilat lower extremities. Wears compression hoses during the day.

## 2018-10-22 ENCOUNTER — Other Ambulatory Visit: Payer: Self-pay

## 2018-10-22 MED ORDER — BENZONATATE 100 MG PO CAPS: 100.0000 mg | ORAL_CAPSULE | Freq: Three times a day (TID) | ORAL | 0 refills | Status: DC | PRN

## 2018-10-24 DIAGNOSIS — M8580 Other specified disorders of bone density and structure, unspecified site: Secondary | ICD-10-CM | POA: Insufficient documentation

## 2018-10-24 DIAGNOSIS — Z9181 History of falling: Secondary | ICD-10-CM | POA: Insufficient documentation

## 2018-10-24 DIAGNOSIS — Z79811 Long term (current) use of aromatase inhibitors: Secondary | ICD-10-CM | POA: Insufficient documentation

## 2018-10-24 NOTE — Progress Notes (Signed)
Hematology/Oncology follow up note [facility] Telephone:(336) [phone] [phone]   Patient Care Team: [name], MD as PCP - General (Family Medicine) Osborne Oman, MD as Consulting Physician (Obstetrics and Gynecology) Hilarie Fredrickson, Lajuan Lines, MD as Consulting Physician (Gastroenterology) Telford Nab, RN as Registered Nurse   CHIEF COMPLAINTS/REASON FOR VISIT:  Evaluation of breast cancer  HISTORY OF PRESENTING ILLNESS:  Jamie Matthews is a  78 y.o.  female with PMH listed below who was referred to me for evaluation of breast cancer 05/05/2018 bilateral diagnostic breast mammogram showed suspicious mass 1.1cm  in the 12:00 retroareolar region of the left breast and the left axillary lymph node. 06/08/2018 patient status post a left breast retroareolar and left axillary lymph node biopsy. Pathology showed invasive mammary carcinoma, no special type, grade 1, left axillary lymph node positive for invasive mammary carcinoma clinically metastatic.  Background lymph node architecture is not identified. ER> 90% PR> 90%, HER-2 negative.  Patient present to establish care for newly diagnosed breast cancer. She has an appointment to see Dr. Dahlia Byes later today.  Denies history of hormone replacement therapy. Family history positive for sister who was diagnosed with breast cancer at age of 70. Recent episode of runny nose, nasal congestion, ear fullness and throat clearing cough. Was diagnosed with viral URI.   #  PET scan done which unfortunately showed additional hypermetabolic bilateral hilar lymph nodes as well as left upper lobe lung mass which may represent a focus of metastatic disease from breast, or primary lung neoplasm.  There is multiple additional small pulmonary nodules are scattered throughout both lungs which are worrisome for metastasis.  Index nodule within the medial right upper lobe measures 7 mm,, peri-broncho-vascular nodule in  the right lower lobe measures 1.2 cm.  # Biopsy of lung mass left upper lobe showed non-small cell lung cancer, favor adenocarcinoma.  #NGS came back patient has PD L1 is 70% TPS, MET fusion mutation.  #Mid-March 2020 started on crizotinib INTERVAL HISTORY Jamie Matthews is a 78 y.o. female who has above history reviewed by me today presents for follow up visit for evaluation of tolerability of treatment for breast cancer and lung cancer.  Patient reports tolerating crizotinib well.  Tolerating well. Also on Arimidex.  Denies any hot flash, joint achiness. She walks with a walker now.  Pelvic pain from fracture has improved. She has bilateral lower extremity edema wearing compression stocking during the day.   Review of Systems  Constitutional: Positive for fatigue. Negative for appetite change, chills and fever.  HENT:   Negative for hearing loss and voice change.   Eyes: Negative for eye problems.  Respiratory: Negative for chest tightness and cough.   Cardiovascular: Positive for leg swelling. Negative for chest pain.  Gastrointestinal: Negative for abdominal distention, abdominal pain and blood in stool.  Endocrine: Negative for hot flashes.  Genitourinary: Negative for difficulty urinating and frequency.   Musculoskeletal: Negative for arthralgias.  Skin: Negative for itching and rash.  Neurological: Negative for extremity weakness.  Hematological: Negative for adenopathy.  Psychiatric/Behavioral: Negative for confusion.    MEDICAL HISTORY:  Past Medical History:  Diagnosis Date  . AKI (acute kidney injury) ([place]) 08/17/2018  . Anxiety   . Arthritis   . Belching   . Bladder disorder    OVERACTIVE  . Bowel dysfunction    BLOCKAGE  . Cancer (H[facility])    breast  . Constipation   . Depression   . Diverticulitis   .  Fibromyalgia   . GERD (gastroesophageal reflux disease)   . Hyperlipidemia   . IBS (irritable bowel syndrome)   . Internal hemorrhoids   .  Memory deficits   . Murmur    asymptomatic  . Pneumonia 11/18/12  . Urinary incontinence   . Vertigo     SURGICAL HISTORY: Past Surgical History:  Procedure Laterality Date  . AXILLARY LYMPH NODE BIOPSY Left 06/08/2018   INVASIVE MAMMARY CARCINOMA  . BLADDER SUSPENSION  2004, 2012  . BREAST BIOPSY Left 06/08/2018   INVASIVE MAMMARY CARCINOMA  . CATARACT EXTRACTION W/PHACO Right 08/27/2015   Procedure: CATARACT EXTRACTION PHACO AND INTRAOCULAR LENS PLACEMENT (IOC);  Surgeon: Estill Cotta, MD;  Location: [facility] ORS;  Service: Ophthalmology;  Laterality: Right;  [place]   1:00.2 AP%  22.5 CDE  23.67 fluid casette lot #[id number]  exp05/31/2018  . CATARACT EXTRACTION W/PHACO Left 10/15/2015   Procedure: CATARACT EXTRACTION PHACO AND INTRAOCULAR LENS PLACEMENT (IOC);  Surgeon: Estill Cotta, MD;  Location: [facility] ORS;  Service: Ophthalmology;  Laterality: Left;  [place] 01:07 AP% 18.1 CDE 21.57 fluid pack lot # [id number]  . CHOLECYSTECTOMY    . COLONOSCOPY  2017  . ELECTROMAGNETIC NAVIGATION BROCHOSCOPY N/A 07/09/2018   Procedure: ELECTROMAGNETIC NAVIGATION BRONCHOSCOPY;  Surgeon: Flora Lipps, MD;  Location: [facility] ORS;  Service: Cardiopulmonary;  Laterality: N/A;  . TONSILLECTOMY  1947    SOCIAL HISTORY: Social History   Socioeconomic History  . Marital status: Married    Spouse name: Bennye Alm  . Number of children: 0  . Years of education: Master's Degree  . Highest education level: Not on file  Occupational History  . Occupation: Retired  Scientific laboratory technician  . Financial resource strain: Not hard at all  . Food insecurity:    Worry: Not on file    Inability: Not on file  . Transportation needs:    Medical: Not on file    Non-medical: Not on file  Tobacco Use  . Smoking status: Never Smoker  . Smokeless tobacco: Never Used  Substance and Sexual Activity  . Alcohol use: Yes    Comment: rare wine  . Drug use: No  . Sexual activity: Not Currently  Lifestyle  . Physical activity:     Days per week: Not on file    Minutes per session: Not on file  . Stress: Not on file  Relationships  . Social connections:    Talks on phone: Not on file    Gets together: Not on file    Attends religious service: Not on file    Active member of club or organization: Not on file    Attends meetings of clubs or organizations: Not on file    Relationship status: Not on file  . Intimate partner violence:    Fear of current or ex partner: Not on file    Emotionally abused: Not on file    Physically abused: Not on file    Forced sexual activity: Not on file  Other Topics Concern  . Not on file  Social History Narrative   Recently moved with her husband to [place] from [place].   Husband is a retired Pharmacist, community.   No children.   She is a retired Pharmacist, hospital.   Enjoys: Chief Technology Officer, reading - mysteries and biographies, cooking   Exercise: walking, gardening   Diet: low appetite due to cancer treatment, grazing   She is a DNR.    FAMILY HISTORY: Family History  Problem Relation Age of Onset  . Diabetes  Father   . Heart disease Father   . Lymphoma Father   . Heart disease Mother   . Breast cancer Sister 63  . Colon cancer Neg Hx   . Esophageal cancer Neg Hx   . Rectal cancer Neg Hx   . Stomach cancer Neg Hx   . Bladder Cancer Neg Hx   . Kidney cancer Neg Hx     ALLERGIES:  is allergic to sulfa antibiotics.  MEDICATIONS:  Current Outpatient Medications  Medication Sig Dispense Refill  . acetaminophen (TYLENOL) 325 MG tablet Take 325 mg by mouth every 6 (six) hours as needed for mild pain.     [name] anastrozole (ARIMIDEX) 1 MG tablet Take 1 tablet (1 mg total) by mouth daily. 30 tablet 3  . atorvastatin (LIPITOR) 20 MG tablet TAKE 1 TABLET DAILY (Patient taking differently: Take 20 mg by mouth at bedtime. ) 90 tablet 3  . buPROPion (WELLBUTRIN XL) 150 MG 24 hr tablet TAKE ONE TABLET BY MOUTH EVERY DAY (Patient taking differently: Take 150 mg by mouth daily. ) 90 tablet 2  .  desonide (DESOWEN) 0.05 % lotion Apply 1 application topically as needed (after showering).     . DEXILANT 60 MG capsule TAKE 1 CAPSULE BY MOUTH ONCE DAILY (Patient taking differently: Take 60 mg by mouth daily. ) 90 capsule 0  . docusate sodium (COLACE) 100 MG capsule Take 1 capsule (100 mg total) by mouth 2 (two) times daily. 60 capsule 0  . famotidine (PEPCID) 20 MG tablet Take 20 mg by mouth 2 (two) times daily.    [name] Fesoterodine Fumarate (TOVIAZ PO) Take 1 tablet by mouth daily.    [name] ketoconazole (NIZORAL) 2 % shampoo Apply 1 application topically. 3 times a week    . lubiprostone (AMITIZA) 24 MCG capsule TAKE ONE CAPSULE TWICE A DAY WITH MEALS (Patient taking differently: Take 24 mcg by mouth 2 (two) times daily. ) 60 capsule 3  . memantine (NAMENDA) 10 MG tablet TAKE ONE TABLET BY MOUTH TWICE DAILY (Patient taking differently: Take 10 mg by mouth 2 (two) times daily. ) 180 tablet 2  . Multiple Vitamin (MULTIVITAMIN WITH MINERALS) TABS tablet Take 1 tablet by mouth daily.    [name] MYRBETRIQ 50 MG TB24 tablet TAKE 1 TABLET BY MOUTH DAILY (Patient taking differently: Take 50 mg by mouth daily. ) 90 tablet 1  . polyethylene glycol (MIRALAX / GLYCOLAX) packet Take 17 g by mouth daily.    [name] triamcinolone (NASACORT) 55 MCG/ACT AERO nasal inhaler Place 2 sprays into the nose daily as needed (for congestion).     . venlafaxine XR (EFFEXOR-XR) 150 MG 24 hr capsule TAKE 1 CAPSULE BY MOUTH DAILY (Patient taking differently: Take 150 mg by mouth daily with breakfast. ) 90 capsule 1  . XALKORI 250 MG capsule TAKE 1 CAPSULE (250 MG TOTAL) BY MOUTH 2 TIMES DAILY. (Patient taking differently: Take 250 mg by mouth 2 (two) times daily. ) 60 capsule 0  . benzonatate (TESSALON) 100 MG capsule Take 1 capsule (100 mg total) by mouth 3 (three) times daily as needed for cough. 90 capsule 0  . Dextromethorphan-guaiFENesin (MUCINEX DM) 30-600 MG TB12 Take 1 tablet by mouth 2 (two) times daily as needed (for  congestion/cough).    . diphenhydrAMINE (BENADRYL) 25 MG tablet Take 25 mg by mouth at bedtime as needed for allergies.     [name] ondansetron (ZOFRAN) 8 MG tablet Take 8 mg by mouth every 8 (eight) hours as needed for  nausea.    [name] PROMETHAZINE-DM PO Take 1 capsule by mouth 4 (four) times daily as needed for cough.     . solifenacin (VESICARE) 5 MG tablet Take 1 tablet (5 mg total) by mouth daily. (Patient not taking: Reported on 10/21/2018) 30 tablet 3   No current facility-administered medications for this visit.      PHYSICAL EXAMINATION: ECOG PERFORMANCE STATUS: 1 - Symptomatic but completely ambulatory Vitals:   10/21/18 1024  BP: (!) 120/59  Pulse: 63  Resp: 18  Temp: 97.6 F (36.4 C)  SpO2: 94%   Filed Weights   10/21/18 1024  Weight: 157 lb 12.8 oz (71.6 kg)    Physical Exam  Constitutional: She is oriented to person, place, and time. No distress.  Frail female, walk in  with walker.  HENT:  Head: Normocephalic and atraumatic.  Nose: Nose normal.  Mouth/Throat: Oropharynx is clear and moist. No oropharyngeal exudate.  Eyes: Pupils are equal, round, and reactive to light. EOM are normal. No scleral icterus.  Neck: Normal range of motion. Neck supple.  Cardiovascular: Normal rate and regular rhythm.  Pulmonary/Chest: Effort normal. No respiratory distress. She has no rales. She exhibits no tenderness.  Abdominal: Soft. She exhibits no distension. There is no abdominal tenderness.  Musculoskeletal: Normal range of motion.        General: No edema.     Comments: Bilateral lower extremity 2+ edema.  Neurological: She is alert and oriented to person, place, and time. No cranial nerve deficit. She exhibits normal muscle tone. Coordination normal.  Skin: Skin is warm and dry. She is not diaphoretic. No erythema.  Psychiatric: Affect normal.     CMP Latest Ref Rng & Units 10/21/2018  Glucose 70 - 99 mg/dL 135(H)  BUN 8 - 23 mg/dL 25(H)  Creatinine 0.44 - 1.00 mg/dL 1.16(H)   Sodium 135 - 145 mmol/L 139  Potassium 3.5 - 5.1 mmol/L 4.5  Chloride 98 - 111 mmol/L 107  CO2 22 - 32 mmol/L 26  Calcium 8.9 - 10.3 mg/dL 7.8(L)  Total Protein 6.5 - 8.1 g/dL 5.8(L)  Total Bilirubin 0.3 - 1.2 mg/dL 0.5  Alkaline Phos 38 - 126 U/L 128(H)  AST 15 - 41 U/L 32  ALT 0 - 44 U/L 34   CBC Latest Ref Rng & Units 10/21/2018  WBC 4.0 - 10.5 K/uL 8.2  Hemoglobin 12.0 - 15.0 g/dL 9.7(L)  Hematocrit 36.0 - 46.0 % 30.5(L)  Platelets 150 - 400 K/uL 303    LABORATORY DATA:  I have reviewed the data as listed Lab Results  Component Value Date   WBC 8.2 10/21/2018   HGB 9.7 (L) 10/21/2018   HCT 30.5 (L) 10/21/2018   MCV 98.4 10/21/2018   PLT 303 10/21/2018   Recent Labs    09/27/18 1407 10/09/18 2256 10/11/18 0514 10/21/18 0958  NA 140 136 142 139  K 4.8 3.8 3.6 4.5  CL 106 104 111 107  CO2 _0 GLUCOSE 120* 129* 118* 135*  BUN _1 25*  CREATININE 0.99 0.98 0.81 1.16*  CALCIUM 8.1* 7.6* 7.3* 7.8*  GFRNONAA 54* 54* >60 44*  GFRAA >60 >60 >60 51*  PROT 6.0* 5.5*  --  5.8*  ALBUMIN 3.1* 2.8*  --  3.0*  AST 24 28  --  32  ALT 31 37  --  34  ALKPHOS 184* 153*  --  128*  BILITOT 0.4 0.5  --  0.5   Iron/TIBC/Ferritin/ %Sat No  results found for: IRON, TIBC, FERRITIN, IRONPCTSAT   RADIOGRAPHIC STUDIES: I have personally reviewed the radiological images as listed and agreed with the findings in the report.  06/21/2018 PET scan.  1. Left subareolar breast lesion with biopsy clip exhibits mild increased uptake with enlarged and hypermetabolic left axillary lymph node. 2. There is a dominant left upper lobe lung mass which exhibits moderate increased uptake and may represent a focus of metastatic disease from breast primary or primary lung neoplasm. Multiple additional small pulmonary nodules are scattered throughout both lungs which are worrisome for metastasis. 3. Hypermetabolic bilateral hilar lymph nodes concerning for metastatic disease. 4.   Aortic Atherosclerosis (ICD10-I70.0).  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  10/19/2018 CT chest with contrast Dominant left upper lobe nodule has decreased in size slightly in the interval.  Additional smaller bilateral spiculated pulmonary nodules are stable. Borderline right hilar and left axillary adenopathy.  Stable to minimally decreased in size. Mild T1 11 compression deformity is new. Aortic atherosclerosis CAD.    ASSESSMENT & PLAN:  1. Primary adenocarcinoma of lung, unspecified laterality ([place])   2. Malignant neoplasm of left female breast, unspecified estrogen receptor status, unspecified site of breast ([place])   3. Closed displaced fracture of pubis, unspecified laterality, sequela   4. Use of aromatase inhibitors   5. Osteopenia, unspecified location   6. At risk for falls   7. Goals of care, counseling/discussion   Labs are reviewed and discussed with patient. CT images were independently reviewed by me and discussed with patient.  Some response, overall stable disease. Patient has 2 primaries.  cT2 N3 M1a stage IV non-small cell lung cancer, and stage IB cT1b,N1 M0  ER+PR+  grade 1 breast cancer. #For her stage IV lung cancer with met fusion mutation, tolerating crizotinib 250 mg twice daily.  Continue. . Transaminitis resolved.  For breast cancer, continue Arimidex .  Regarding breast surgery, I had a discussion with Dr. Peyton Najjar.  Given patient's incurable lung cancer disease, overall prognosis is poor even with a targetable mutation.  Curative breast cancer surgery may not add additional benefit to her life expectancy.  For now we will continue Arimidex treatment. I discussed with patient.  Goal of care was discussed again.  She appreciated explanation and understands that her disease not curable.  #Osteopenia in the context of Arimidex use, fall risk history of pelvic fracture. I discussed with patient that I recommend bisphosphonate/Xgeva.   Recommend to obtain dental clearance. Recommend calcium and vitamin D supplementation. Recent vitamin D is 36.8.   # lower extremity swelling, right >left, US venous right 09/20/2018 no DVT.  2D echo 10/10/2018 showed LVEF 55 to 60%.  Mildly increased left ventricle wall thickness.  Left ventricular diastolic parameters are indeterminate.  Right ventricle normal systolic function.  Mild stenosis of aortic valve. Advised patient to continue use compression stocking, leg elevation, low salt diet.  Peripheral edema may be secondary to Crizotinib.   # AKI, encourage patient to increase oral hydration.   All questions were answered. The patient knows to call the clinic with any problems questions or concerns.  Return of visit: 4 weeks.  We spent sufficient time to discuss many aspect of care, questions were answered to patient's satisfaction. Total face to face encounter time for this patient visit was 40 min. >50% of the time was  spent in counseling and coordination of care.    Earlie Server, MD, PhD Hematology Oncology [place] [facility]  Pager- [phone] 10/24/2018

## 2018-10-28 ENCOUNTER — Other Ambulatory Visit: Payer: Self-pay

## 2018-11-01 ENCOUNTER — Encounter: Payer: Self-pay | Admitting: *Deleted

## 2018-11-01 ENCOUNTER — Other Ambulatory Visit: Payer: Self-pay | Admitting: Oncology

## 2018-11-01 ENCOUNTER — Telehealth: Payer: Self-pay | Admitting: *Deleted

## 2018-11-01 DIAGNOSIS — K5909 Other constipation: Secondary | ICD-10-CM

## 2018-11-01 DIAGNOSIS — K219 Gastro-esophageal reflux disease without esophagitis: Secondary | ICD-10-CM

## 2018-11-01 DIAGNOSIS — K581 Irritable bowel syndrome with constipation: Secondary | ICD-10-CM

## 2018-11-01 NOTE — Telephone Encounter (Signed)
Patient left another voicemail stating that she had a bone density test done back in 2019 and wants the results of that. Patient stated that she is taking Arimidex that may thin her bones. Patient stated that she may need a more current test done. Patient requested a call back regarding her concerns.

## 2018-11-01 NOTE — Progress Notes (Signed)
  Oncology Nurse Navigator Documentation  Navigator Location: [facility] (11/01/18 1400)   )Navigator Encounter Type: Telephone (11/01/18 1400) Telephone: Incoming Call (11/01/18 1400)                       Barriers/Navigation Needs: Education;Coordination of Care (11/01/18 1400) Education: Understanding Cancer/ Treatment Options (11/01/18 1400) Interventions: Coordination of Care (11/01/18 1400)                      Time Spent with Patient: 30 (11/01/18 1400)   Patient called and had questions regarding her dental appointment, bone density and meds.  States she does not want to change or add meds that may have a lot of side effects.  Discussed with Tasia Catchings and explained to patient and her husband that the dental appointment is for dental clearance prior to starting medication for bone loss.  Reviewed that per Dr. Tasia Catchings patient does have osteopenia and has had a recent fracture.  Due to the osteopenia and high risk meds Dr. Tasia Catchings is considering treating her bone loss.  Reviewed to keep her dental appointment, and that he will need to send a letter clearing her to move forward to take one of the medications on the list that was provided to her.  Reminded of her appointment with Dr. Tasia Catchings on 11/18/18 to discuss further treatment planning.  They are agreeable.

## 2018-11-01 NOTE — Telephone Encounter (Signed)
Patient called stating that she is taking 6-7 pills both morning at night. Patient stated that she is having an upset stomach lately and thinks it may be associated with all the pills that she is taking at the same time. Patient wants a recommendation as how to take her medication so that she will not be taking so many at the same time. Patient requested a call back.

## 2018-11-01 NOTE — Telephone Encounter (Signed)
Based on previous visit had recommended treatment for constipation as well as GERD. If these symptoms are not improving, would recommend GI referral to discuss and manage.   In the meantime would recommend the following:   1) Could schedule an appointment to discuss medications and see if any could be stopped  OR   2) Could try spacing out morning and evening meds -- take 3-4 medications together and then waiting 2 hours to take the next 3-4 medications.   OR  3) May want to consider reaching out to Dr. Tasia Catchings as the Crizotinib can cause stomach issues in up to 50% of patients taking it.   [name]

## 2018-11-01 NOTE — Telephone Encounter (Signed)
Discussed all with patient. Advised of last BMD results in the chart. Patient will try option 2 and 3. She will call and make an appointment with Dr. Einar Pheasant if has any issues still.

## 2018-11-02 ENCOUNTER — Telehealth: Payer: Self-pay

## 2018-11-02 NOTE — Telephone Encounter (Signed)
Pt thinks that some of her med may be causing nausea and heartburn; pt is not sure which med is bothering her or even if it is a medication that is causing the nausea and heartburn. Pt wants to know if there is any med that pt could stop. Pt wonders if taking too many meds. Pt request cb after Dr Einar Pheasant reviews; pt mentioned she had spoken with Dr Einar Pheasant about this before and when I asked when was discussed she said it could have been someone else she talked to;pt was not sure. FYI to Dr Einar Pheasant.

## 2018-11-02 NOTE — Telephone Encounter (Signed)
Please see prior response to this question.   Pt spoke with Anastasiya yesterday and planned to space out medications and contact her oncologist.   Will send mychart message with some recommendations

## 2018-11-04 ENCOUNTER — Other Ambulatory Visit: Payer: Self-pay | Admitting: Oncology

## 2018-11-04 DIAGNOSIS — C3490 Malignant neoplasm of unspecified part of unspecified bronchus or lung: Secondary | ICD-10-CM

## 2018-11-08 ENCOUNTER — Encounter: Payer: Self-pay | Admitting: *Deleted

## 2018-11-08 ENCOUNTER — Other Ambulatory Visit: Payer: Self-pay | Admitting: Oncology

## 2018-11-08 MED ORDER — OMEPRAZOLE 20 MG PO CPDR: 20.0000 mg | DELAYED_RELEASE_CAPSULE | Freq: Every day | ORAL | 3 refills | Status: DC

## 2018-11-08 NOTE — Progress Notes (Signed)
Patient called. States she is unable to sleep due to acid reflux. Wants to know what else she can take or do. She is taking her Pepcid, using Mylanta, and gummy tums. Encouraged her raise the head of the bed or prop up on pillows, eat early 3-4 hours before bedtime, no caffeine or spicy food. Any new meds or recommendations?  Message sent to Dr. Tasia Catchings and she recommends that patient stop Pepcid and start Omeprozole.  Patient informed.

## 2018-11-15 ENCOUNTER — Other Ambulatory Visit: Payer: Self-pay | Admitting: Family Medicine

## 2018-11-15 DIAGNOSIS — C3490 Malignant neoplasm of unspecified part of unspecified bronchus or lung: Secondary | ICD-10-CM

## 2018-11-15 DIAGNOSIS — J069 Acute upper respiratory infection, unspecified: Secondary | ICD-10-CM

## 2018-11-15 MED FILL — XALKORI 250 MG CAPSULE: 250 | 30 days supply | Qty: 60 | Fill #0

## 2018-11-17 ENCOUNTER — Other Ambulatory Visit: Payer: Self-pay

## 2018-11-17 ENCOUNTER — Telehealth: Payer: Self-pay | Admitting: Oncology

## 2018-11-17 NOTE — Telephone Encounter (Signed)
Please verify that patient still needs this medication.

## 2018-11-17 NOTE — Telephone Encounter (Signed)
Spoke with pt to confirm appt date/time, do pre-appt screen which was completed, and adv of Covid-19 guidelines for appt regarding screening questions, temperature check, face mask required, and no visitors allowed °

## 2018-11-18 ENCOUNTER — Inpatient Hospital Stay: Payer: Medicare Other | Attending: Oncology

## 2018-11-18 ENCOUNTER — Other Ambulatory Visit: Payer: Self-pay

## 2018-11-18 ENCOUNTER — Encounter: Payer: Self-pay | Admitting: Oncology

## 2018-11-18 ENCOUNTER — Inpatient Hospital Stay (HOSPITAL_BASED_OUTPATIENT_CLINIC_OR_DEPARTMENT_OTHER): Payer: Medicare Other | Admitting: Oncology

## 2018-11-18 VITALS — BP 138/73 | HR 58 | Temp 97.7°F | Resp 18 | Wt 162.6 lb

## 2018-11-18 DIAGNOSIS — M1990 Unspecified osteoarthritis, unspecified site: Secondary | ICD-10-CM | POA: Insufficient documentation

## 2018-11-18 DIAGNOSIS — Z79811 Long term (current) use of aromatase inhibitors: Secondary | ICD-10-CM | POA: Diagnosis not present

## 2018-11-18 DIAGNOSIS — R600 Localized edema: Secondary | ICD-10-CM | POA: Insufficient documentation

## 2018-11-18 DIAGNOSIS — Z79899 Other long term (current) drug therapy: Secondary | ICD-10-CM | POA: Insufficient documentation

## 2018-11-18 DIAGNOSIS — C50912 Malignant neoplasm of unspecified site of left female breast: Secondary | ICD-10-CM

## 2018-11-18 DIAGNOSIS — E785 Hyperlipidemia, unspecified: Secondary | ICD-10-CM | POA: Insufficient documentation

## 2018-11-18 DIAGNOSIS — K589 Irritable bowel syndrome without diarrhea: Secondary | ICD-10-CM | POA: Insufficient documentation

## 2018-11-18 DIAGNOSIS — F419 Anxiety disorder, unspecified: Secondary | ICD-10-CM | POA: Insufficient documentation

## 2018-11-18 DIAGNOSIS — R42 Dizziness and giddiness: Secondary | ICD-10-CM | POA: Insufficient documentation

## 2018-11-18 DIAGNOSIS — C3490 Malignant neoplasm of unspecified part of unspecified bronchus or lung: Secondary | ICD-10-CM | POA: Diagnosis not present

## 2018-11-18 DIAGNOSIS — M7989 Other specified soft tissue disorders: Secondary | ICD-10-CM

## 2018-11-18 DIAGNOSIS — M8580 Other specified disorders of bone density and structure, unspecified site: Secondary | ICD-10-CM | POA: Insufficient documentation

## 2018-11-18 DIAGNOSIS — R011 Cardiac murmur, unspecified: Secondary | ICD-10-CM | POA: Insufficient documentation

## 2018-11-18 DIAGNOSIS — M797 Fibromyalgia: Secondary | ICD-10-CM | POA: Insufficient documentation

## 2018-11-18 DIAGNOSIS — K219 Gastro-esophageal reflux disease without esophagitis: Secondary | ICD-10-CM | POA: Diagnosis not present

## 2018-11-18 DIAGNOSIS — Z7189 Other specified counseling: Secondary | ICD-10-CM

## 2018-11-18 DIAGNOSIS — R7989 Other specified abnormal findings of blood chemistry: Secondary | ICD-10-CM

## 2018-11-18 LAB — CBC WITH DIFFERENTIAL/PLATELET
Abs Immature Granulocytes: 0.05 10*3/uL (ref 0.00–0.07)
Basophils Absolute: 0 10*3/uL (ref 0.0–0.1)
Basophils Relative: 1 %
Eosinophils Absolute: 0.5 10*3/uL (ref 0.0–0.5)
Eosinophils Relative: 8 %
HCT: 33.9 % — ABNORMAL LOW (ref 36.0–46.0)
Hemoglobin: 10.7 g/dL — ABNORMAL LOW (ref 12.0–15.0)
Immature Granulocytes: 1 %
Lymphocytes Relative: 20 %
Lymphs Abs: 1.2 10*3/uL (ref 0.7–4.0)
MCH: 31.5 pg (ref 26.0–34.0)
MCHC: 31.6 g/dL (ref 30.0–36.0)
MCV: 99.7 fL (ref 80.0–100.0)
Monocytes Absolute: 1 10*3/uL (ref 0.1–1.0)
Monocytes Relative: 16 %
Neutro Abs: 3.4 10*3/uL (ref 1.7–7.7)
Neutrophils Relative %: 54 %
Platelets: 308 10*3/uL (ref 150–400)
RBC: 3.4 MIL/uL — ABNORMAL LOW (ref 3.87–5.11)
RDW: 15 % (ref 11.5–15.5)
WBC: 6.1 10*3/uL (ref 4.0–10.5)
nRBC: 0 % (ref 0.0–0.2)

## 2018-11-18 LAB — COMPREHENSIVE METABOLIC PANEL
ALT: 37 U/L (ref 0–44)
AST: 33 U/L (ref 15–41)
Albumin: 3.2 g/dL — ABNORMAL LOW (ref 3.5–5.0)
Alkaline Phosphatase: 137 U/L — ABNORMAL HIGH (ref 38–126)
Anion gap: 6 (ref 5–15)
BUN: 29 mg/dL — ABNORMAL HIGH (ref 8–23)
CO2: 29 mmol/L (ref 22–32)
Calcium: 8.3 mg/dL — ABNORMAL LOW (ref 8.9–10.3)
Chloride: 106 mmol/L (ref 98–111)
Creatinine, Ser: 1.23 mg/dL — ABNORMAL HIGH (ref 0.44–1.00)
GFR calc Af Amer: 48 mL/min — ABNORMAL LOW (ref >60)
GFR calc non Af Amer: 41 mL/min — ABNORMAL LOW (ref >60)
Glucose, Bld: 117 mg/dL — ABNORMAL HIGH (ref 70–99)
Potassium: 4.2 mmol/L (ref 3.5–5.1)
Sodium: 141 mmol/L (ref 135–145)
Total Bilirubin: 0.6 mg/dL (ref 0.3–1.2)
Total Protein: 6 g/dL — ABNORMAL LOW (ref 6.5–8.1)

## 2018-11-18 NOTE — Telephone Encounter (Signed)
Left message for patient to call back  

## 2018-11-18 NOTE — Progress Notes (Signed)
Patient here for follow up. Pt concerned about bilat lower extremity swelling.

## 2018-11-19 ENCOUNTER — Ambulatory Visit: Payer: Medicare Other

## 2018-11-20 NOTE — Progress Notes (Signed)
Hematology/Oncology follow up note [facility] Telephone:([phone]) [phone] Fax:(336) [phone]   Patient Care Team: [name], MD as PCP - General (Family Medicine) Osborne Oman, MD as Consulting Physician (Obstetrics and Gynecology) Hilarie Fredrickson, Lajuan Lines, MD as Consulting Physician (Gastroenterology) Telford Nab, RN as Registered Nurse   CHIEF COMPLAINTS/REASON FOR VISIT:  Evaluation of breast cancer  HISTORY OF PRESENTING ILLNESS:  Jamie Matthews is a  78 y.o.  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 05/05/2018 bilateral diagnostic breast mammogram showed suspicious mass 1.1cm  in the 12:00 retroareolar region of the left breast and the left axillary lymph node. 06/08/2018 patient status post a left breast retroareolar and left axillary lymph node biopsy. Pathology showed invasive mammary carcinoma, no special type, grade 1, left axillary lymph node positive for invasive mammary carcinoma clinically metastatic.  Background lymph node architecture is not identified. ER> 90% PR> 90%, HER-2 negative.  PET scan done which unfortunately showed additional hypermetabolic bilateral hilar lymph nodes as well as left upper lobe lung mass which may represent a focus of metastatic disease from breast, or primary lung neoplasm.  There is multiple additional small pulmonary nodules are scattered throughout both lungs which are worrisome for metastasis.  Index nodule within the medial right upper lobe measures 7 mm,, peri-broncho-vascular nodule in the right lower lobe measures 1.2 cm.  # Stage IV lung cancer-  Biopsy of lung mass left upper lobe showed non-small cell lung cancer, favor adenocarcinoma.  #NGS came back patient has PD L1 is 70% TPS, MET fusion mutation.  #Mid-March 2020 started on crizotinib INTERVAL HISTORY Jamie Matthews is a 79 y.o. female who has above history reviewed by me today presents  for follow up visit for evaluation of tolerability of treatment for breast cancer and Stage IV lung cancer. Patient has been on crizotinib and tolerating well. Only problem is bilateral lower extremity swelling which was felt to be secondary to crizotinib.  Also taking Arimidex.  Denies any hot flashes or joint achiness.  Tolerating well.  She had a history of fall and pelvic fracture.  Walks with a walker now.  Pelvic pain has resolved already  Review of Systems  Constitutional: Positive for fatigue. Negative for appetite change, chills and fever.  HENT:   Negative for hearing loss and voice change.   Eyes: Negative for eye problems.  Respiratory: Negative for chest tightness and cough.   Cardiovascular: Positive for leg swelling. Negative for chest pain.  Gastrointestinal: Negative for abdominal distention, abdominal pain and blood in stool.  Endocrine: Negative for hot flashes.  Genitourinary: Negative for difficulty urinating and frequency.   Musculoskeletal: Negative for arthralgias.  Skin: Negative for itching and rash.  Neurological: Negative for extremity weakness.  Hematological: Negative for adenopathy.  Psychiatric/Behavioral: Negative for confusion.    MEDICAL HISTORY:  Past Medical History:  Diagnosis Date  . AKI (acute kidney injury) ([place]) 08/17/2018  . Anxiety   . Arthritis   . Belching   . Bladder disorder    OVERACTIVE  . Bowel dysfunction    BLOCKAGE  . Cancer (HCC)    breast  . Constipation   . Depression   . Diverticulitis   . Fibromyalgia   . GERD (gastroesophageal reflux disease)   . Hyperlipidemia   . IBS (irritable bowel syndrome)   . Internal hemorrhoids   . Memory deficits   . Murmur    asymptomatic  . Pneumonia 11/18/12  .  Urinary incontinence   . Vertigo     SURGICAL HISTORY: Past Surgical History:  Procedure Laterality Date  . AXILLARY LYMPH NODE BIOPSY Left 06/08/2018   INVASIVE MAMMARY CARCINOMA  . BLADDER SUSPENSION  2004, 2012   . BREAST BIOPSY Left 06/08/2018   INVASIVE MAMMARY CARCINOMA  . CATARACT EXTRACTION W/PHACO Right 08/27/2015   Procedure: CATARACT EXTRACTION PHACO AND INTRAOCULAR LENS PLACEMENT (IOC);  Surgeon: Estill Cotta, MD;  Location: [facility] ORS;  Service: Ophthalmology;  Laterality: Right;  [place]   1:00.2 AP%  22.5 CDE  23.67 fluid casette lot #[id number]  exp05/31/2018  . CATARACT EXTRACTION W/PHACO Left 10/15/2015   Procedure: CATARACT EXTRACTION PHACO AND INTRAOCULAR LENS PLACEMENT (IOC);  Surgeon: Estill Cotta, MD;  Location: [facility] ORS;  Service: Ophthalmology;  Laterality: Left;  [place] 01:07 AP% 18.1 CDE 21.57 fluid pack lot # [id number]  . CHOLECYSTECTOMY    . COLONOSCOPY  2017  . ELECTROMAGNETIC NAVIGATION BROCHOSCOPY N/A 07/09/2018   Procedure: ELECTROMAGNETIC NAVIGATION BRONCHOSCOPY;  Surgeon: Flora Lipps, MD;  Location: [facility] ORS;  Service: Cardiopulmonary;  Laterality: N/A;  . TONSILLECTOMY  1947    SOCIAL HISTORY: Social History   Socioeconomic History  . Marital status: Married    Spouse name: Bennye Alm  . Number of children: 0  . Years of education: Master's Degree  . Highest education level: Not on file  Occupational History  . Occupation: Retired  Scientific laboratory technician  . Financial resource strain: Not hard at all  . Food insecurity    Worry: Not on file    Inability: Not on file  . Transportation needs    Medical: Not on file    Non-medical: Not on file  Tobacco Use  . Smoking status: Never Smoker  . Smokeless tobacco: Never Used  Substance and Sexual Activity  . Alcohol use: Yes    Comment: rare wine  . Drug use: No  . Sexual activity: Not Currently  Lifestyle  . Physical activity    Days per week: Not on file    Minutes per session: Not on file  . Stress: Not on file  Relationships  . Social Herbalist on phone: Not on file    Gets together: Not on file    Attends religious service: Not on file    Active member of club or organization: Not on file    Attends  meetings of clubs or organizations: Not on file    Relationship status: Not on file  . Intimate partner violence    Fear of current or ex partner: Not on file    Emotionally abused: Not on file    Physically abused: Not on file    Forced sexual activity: Not on file  Other Topics Concern  . Not on file  Social History Narrative   Recently moved with her husband to [place] from [place].   Husband is a retired Pharmacist, community.   No children.   She is a retired Pharmacist, hospital.   Enjoys: Chief Technology Officer, reading - mysteries and biographies, cooking   Exercise: walking, gardening   Diet: low appetite due to cancer treatment, grazing   She is a DNR.    FAMILY HISTORY: Family History  Problem Relation Age of Onset  . Diabetes Father   . Heart disease Father   . Lymphoma Father   . Heart disease Mother   . Breast cancer Sister 55  . Colon cancer Neg Hx   . Esophageal cancer Neg Hx   . Rectal cancer  Neg Hx   . Stomach cancer Neg Hx   . Bladder Cancer Neg Hx   . Kidney cancer Neg Hx     ALLERGIES:  is allergic to sulfa antibiotics.  MEDICATIONS:  Current Outpatient Medications  Medication Sig Dispense Refill  . acetaminophen (TYLENOL) 325 MG tablet Take 325 mg by mouth every 6 (six) hours as needed for mild pain.     [name] anastrozole (ARIMIDEX) 1 MG tablet TAKE ONE TABLET EVERY DAY 30 tablet 3  . atorvastatin (LIPITOR) 20 MG tablet TAKE 1 TABLET DAILY (Patient taking differently: Take 20 mg by mouth at bedtime. ) 90 tablet 3  . benzonatate (TESSALON) 100 MG capsule Take 1 capsule (100 mg total) by mouth 3 (three) times daily as needed for cough. 90 capsule 0  . buPROPion (WELLBUTRIN XL) 150 MG 24 hr tablet TAKE ONE TABLET BY MOUTH EVERY DAY (Patient taking differently: Take 150 mg by mouth daily. ) 90 tablet 2  . desonide (DESOWEN) 0.05 % lotion Apply 1 application topically as needed (after showering).     . DEXILANT 60 MG capsule TAKE 1 CAPSULE BY MOUTH ONCE DAILY (Patient taking differently:  Take 60 mg by mouth daily. ) 90 capsule 0  . Dextromethorphan-guaiFENesin (MUCINEX DM) 30-600 MG TB12 Take 1 tablet by mouth 2 (two) times daily as needed (for congestion/cough).    . diphenhydrAMINE (BENADRYL) 25 MG tablet Take 25 mg by mouth at bedtime as needed for allergies.     [name] docusate sodium (COLACE) 100 MG capsule Take 1 capsule (100 mg total) by mouth 2 (two) times daily. 60 capsule 0  . Fesoterodine Fumarate (TOVIAZ PO) Take 1 tablet by mouth daily.    [name] ketoconazole (NIZORAL) 2 % shampoo Apply 1 application topically. 3 times a week    . lubiprostone (AMITIZA) 24 MCG capsule TAKE ONE CAPSULE TWICE A DAY WITH MEALS (Patient taking differently: Take 24 mcg by mouth 2 (two) times daily. ) 60 capsule 3  . memantine (NAMENDA) 10 MG tablet TAKE ONE TABLET BY MOUTH TWICE DAILY (Patient taking differently: Take 10 mg by mouth 2 (two) times daily. ) 180 tablet 2  . Multiple Vitamin (MULTIVITAMIN WITH MINERALS) TABS tablet Take 1 tablet by mouth daily.    [name] MYRBETRIQ 50 MG TB24 tablet TAKE 1 TABLET BY MOUTH DAILY (Patient taking differently: Take 50 mg by mouth daily. ) 90 tablet 1  . omeprazole (PRILOSEC) 20 MG capsule Take 1 capsule (20 mg total) by mouth daily. 30 capsule 3  . ondansetron (ZOFRAN) 8 MG tablet Take 8 mg by mouth every 8 (eight) hours as needed for nausea.    . polyethylene glycol (MIRALAX / GLYCOLAX) packet Take 17 g by mouth daily.    [name] PROMETHAZINE-DM PO Take 1 capsule by mouth 4 (four) times daily as needed for cough.     . solifenacin (VESICARE) 5 MG tablet Take 1 tablet (5 mg total) by mouth daily. 30 tablet 3  . triamcinolone (NASACORT) 55 MCG/ACT AERO nasal inhaler Place 2 sprays into the nose daily as needed (for congestion).     . venlafaxine XR (EFFEXOR-XR) 150 MG 24 hr capsule TAKE 1 CAPSULE BY MOUTH DAILY (Patient taking differently: Take 150 mg by mouth daily with breakfast. ) 90 capsule 1  . XALKORI 250 MG capsule TAKE 1 CAPSULE (250 MG TOTAL) BY MOUTH 2 TIMES  DAILY. 60 capsule 0   No current facility-administered medications for this visit.      PHYSICAL EXAMINATION: ECOG  PERFORMANCE STATUS: 2 - Symptomatic, <50% confined to bed Vitals:   11/18/18 1053  BP: 138/73  Pulse: (!) 58  Resp: 18  Temp: 97.7 F (36.5 C)   Filed Weights   11/18/18 1053  Weight: 162 lb 9.6 oz (73.8 kg)    Physical Exam  Constitutional: She is oriented to person, place, and time. No distress.  Frail female, walk in  with walker.  HENT:  Head: Normocephalic and atraumatic.  Nose: Nose normal.  Mouth/Throat: Oropharynx is clear and moist. No oropharyngeal exudate.  Eyes: Pupils are equal, round, and reactive to light. EOM are normal. No scleral icterus.  Neck: Normal range of motion. Neck supple.  Cardiovascular: Normal rate and regular rhythm.  No murmur heard. Pulmonary/Chest: Effort normal. No respiratory distress. She has no rales. She exhibits no tenderness.  Abdominal: Soft. She exhibits no distension. There is no abdominal tenderness.  Musculoskeletal: Normal range of motion.        General: No edema.     Comments: Bilateral lower extremity 2+ edema.  Neurological: She is alert and oriented to person, place, and time. No cranial nerve deficit. She exhibits normal muscle tone. Coordination normal.  Skin: Skin is warm and dry. She is not diaphoretic. No erythema.  Psychiatric: Affect normal.     CMP Latest Ref Rng & Units 11/18/2018  Glucose 70 - 99 mg/dL 117(H)  BUN 8 - 23 mg/dL 29(H)  Creatinine 0.44 - 1.00 mg/dL 1.23(H)  Sodium 135 - 145 mmol/L 141  Potassium 3.5 - 5.1 mmol/L 4.2  Chloride 98 - 111 mmol/L 106  CO2 22 - 32 mmol/L 29  Calcium 8.9 - 10.3 mg/dL 8.3(L)  Total Protein 6.5 - 8.1 g/dL 6.0(L)  Total Bilirubin 0.3 - 1.2 mg/dL 0.6  Alkaline Phos 38 - 126 U/L 137(H)  AST 15 - 41 U/L 33  ALT 0 - 44 U/L 37   CBC Latest Ref Rng & Units 11/18/2018  WBC 4.0 - 10.5 K/uL 6.1  Hemoglobin 12.0 - 15.0 g/dL 10.7(L)  Hematocrit 36.0 - 46.0 %  33.9(L)  Platelets 150 - 400 K/uL 308    LABORATORY DATA:  I have reviewed the data as listed Lab Results  Component Value Date   WBC 6.1 11/18/2018   HGB 10.7 (L) 11/18/2018   HCT 33.9 (L) 11/18/2018   MCV 99.7 11/18/2018   PLT 308 11/18/2018   Recent Labs    10/09/18 2256 10/11/18 0514 10/21/18 0958 11/18/18 1024  NA 136 142 139 141  K 3.8 3.6 4.5 4.2  CL 104 111 107 106  CO2 '25 25 26 29  ' GLUCOSE 129* 118* 135* 117*  BUN 22 15 25* 29*  CREATININE 0.98 0.81 1.16* 1.23*  CALCIUM 7.6* 7.3* 7.8* 8.3*  GFRNONAA 54* >60 44* 41*  GFRAA >60 >60 51* 48*  PROT 5.5*  --  5.8* 6.0*  ALBUMIN 2.8*  --  3.0* 3.2*  AST 28  --  32 33  ALT 37  --  34 37  ALKPHOS 153*  --  128* 137*  BILITOT 0.5  --  0.5 0.6   Iron/TIBC/Ferritin/ %Sat No results found for: IRON, TIBC, FERRITIN, IRONPCTSAT   RADIOGRAPHIC STUDIES: I have personally reviewed the radiological images as listed and agreed with the findings in the report.  06/21/2018 PET scan.  1. Left subareolar breast lesion with biopsy clip exhibits mild increased uptake with enlarged and hypermetabolic left axillary lymph node. 2. There is a dominant left upper lobe lung mass which  exhibits moderate increased uptake and may represent a focus of metastatic disease from breast primary or primary lung neoplasm. Multiple additional small pulmonary nodules are scattered throughout both lungs which are worrisome for metastasis. 3. Hypermetabolic bilateral hilar lymph nodes concerning for metastatic disease. 4.  Aortic Atherosclerosis (ICD10-I70.0).  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  10/19/2018 CT chest with contrast Dominant left upper lobe nodule has decreased in size slightly in the interval.  Additional smaller bilateral spiculated pulmonary nodules are stable. Borderline right hilar and left axillary adenopathy.  Stable to minimally decreased in size. Mild T1 11 compression deformity is new. Aortic  atherosclerosis CAD.    ASSESSMENT & PLAN:  1. Primary adenocarcinoma of lung, unspecified laterality ([place])   2. Malignant neoplasm of left female breast, unspecified estrogen receptor status, unspecified site of breast ([place])   3. Use of aromatase inhibitors   4. Osteopenia, unspecified location   5. Leg swelling   Patient has 2 primaries.  She Stage IV lung adenocarcinoma, met fusion mutation cT2 N3 M1a PDL 1 TPS more than 70%  labs reviewed and discussed with patient. Tolerating crizotinib well.  Labs are reviewed and discussed with patient.  Normocytic anemia, hemoglobin stable.  Continue to monitor.  For breast cancer, continue Arimidex tolerates well.  #Osteopenia in the context of Arimidex use, fall risk history of pelvic fracture. I discussed with patient that I recommend bisphosphonate/Xgeva.   Awaiting dental clearance to start treatments. Continue calcium and vitamin D supplementation.  # lower extremity swelling, right >left, US venous right 09/20/2018 no DVT.  2D echo 10/10/2018 showed LVEF 55 to 60%.   Most likely secondary to crizotinib side effects. Recommend compression stocking.  #Impaired kidney function, ? Crizotinb side effect. leg elevation, compression stockings, and dietary salt modification.  If continue to worsen, will start Lasix.   All questions were answered. The patient knows to call the clinic with any problems questions or concerns.  Return of visit: 4 weeks.    Earlie Server, MD, PhD  11/20/2018

## 2018-11-22 ENCOUNTER — Encounter: Payer: Self-pay | Admitting: *Deleted

## 2018-11-22 MED ORDER — BENZONATATE 100 MG PO CAPS: 100.0000 mg | ORAL_CAPSULE | Freq: Three times a day (TID) | ORAL | 1 refills | Status: DC | PRN

## 2018-11-22 NOTE — Telephone Encounter (Signed)
Had duplicate medication 692 mg and 200 mg dose. Will refill 100 mg dose for patient to take prn given current lung cancer

## 2018-11-22 NOTE — Telephone Encounter (Signed)
Spoke with patient. Patient states she is still taking this medication as needed. Could not tell me exactly how often but several times a month. Not daily. Advised patient I would let Dr. Einar Pheasant review this.

## 2018-11-22 NOTE — Progress Notes (Signed)
Patient left message that she had had reflux all night and had taken Mylanta and Tums.  I returned her call and she stated she had had the "worst heartburn I've ever had".  I asked her if she had been taking the Omeprazole as recommended by Dr. Tasia Catchings, and she stated "not yesterday".  I encouraged to take the Omeprazole as directed daily.  Reiterated that Omeprazole is not for immediate relief.  I informed her that if she continues to have heartburn I feel it would be prudent to have her evaluated by Dr. Tasia Catchings or another provider.  She is to call me back if she continues to have heartburn while taking the recommended medication. She is agreeable.

## 2018-11-23 ENCOUNTER — Encounter: Payer: Self-pay | Admitting: *Deleted

## 2018-11-23 NOTE — Progress Notes (Signed)
Patient called yesterday with complaints of the worst heart burn ever. States she had taken Mylanta and Tums with no relief. I asked if she was taking the Omeprazole that was prescribed by Dr. Tasia Catchings on June 15th when the patient called about her heartburn then. States she had not taken it "yesterday". I am not sure if she had been taking it as prescribed. Informed her yesterday to take the Omeprazole and if heartburn continued she may need to be evaluated. Patient called back today to state that the heartburn is better, but she has a dull constant pain in the middle of her chest. She denies arm, neck, jaw pain. States she does have nausea, but she gets that since starting her "chemo pill" and does have SOB on exertion. Patient has stage 4 lung cancer and breast cancer. I don't want there to be some underlying cardiac issue that is not address since she has been complaining for weeks. Dr. Tasia Catchings is not here. Could you please advise. Does she need to come in to be evaluated? What would you recommend?  Dr. Tasia Catchings recommended patient follow up in the ED.  Notified patient.  States she will talk with her husband.

## 2018-11-25 ENCOUNTER — Telehealth: Payer: Self-pay

## 2018-11-25 ENCOUNTER — Other Ambulatory Visit: Payer: Self-pay

## 2018-11-25 ENCOUNTER — Telehealth: Payer: Self-pay | Admitting: *Deleted

## 2018-11-25 ENCOUNTER — Ambulatory Visit (INDEPENDENT_AMBULATORY_CARE_PROVIDER_SITE_OTHER): Payer: Medicare Other | Admitting: Family Medicine

## 2018-11-25 ENCOUNTER — Encounter: Payer: Self-pay | Admitting: Family Medicine

## 2018-11-25 VITALS — Temp 96.0°F | Wt 161.0 lb

## 2018-11-25 DIAGNOSIS — R05 Cough: Secondary | ICD-10-CM | POA: Diagnosis not present

## 2018-11-25 DIAGNOSIS — Z20828 Contact with and (suspected) exposure to other viral communicable diseases: Secondary | ICD-10-CM | POA: Diagnosis not present

## 2018-11-25 DIAGNOSIS — R12 Heartburn: Secondary | ICD-10-CM | POA: Diagnosis not present

## 2018-11-25 DIAGNOSIS — R058 Other specified cough: Secondary | ICD-10-CM

## 2018-11-25 DIAGNOSIS — R0982 Postnasal drip: Secondary | ICD-10-CM

## 2018-11-25 DIAGNOSIS — Z20822 Contact with and (suspected) exposure to covid-19: Secondary | ICD-10-CM

## 2018-11-25 MED ORDER — OMEPRAZOLE 40 MG PO CPDR: 40.0000 mg | DELAYED_RELEASE_CAPSULE | Freq: Every day | ORAL | 1 refills | Status: DC

## 2018-11-25 MED ORDER — FLUTICASONE PROPIONATE 50 MCG/ACT NA SUSP: 2.0000 | Freq: Every day | NASAL | 6 refills | Status: DC

## 2018-11-25 NOTE — Telephone Encounter (Signed)
See visit from today

## 2018-11-25 NOTE — Telephone Encounter (Signed)
Noted. Done.

## 2018-11-25 NOTE — Telephone Encounter (Signed)
Discussed with patient.   [facility] is testing her today - has worked out Engineer, manufacturing.   As Covid is lower on the ddx will provide letter that if negative, this is very reassuring.   Sent letter to MyChart but if able to fax that would helpful for patient too

## 2018-11-25 NOTE — Telephone Encounter (Signed)
Mr and Mrs [name] were on the phone,(DPR signed) Mrs [name] said that she was speaking with one of the nurses at [facility] that pt was going to have covid test done. Later someone came back and said there would have to be a quarantine sign on pts door and no services would be given by [facility] for 14 day period. Pt is very upset and said that the covid test was just an option and not mandatory. Mr [name] said they want a letter faxed to [facility] at [phone] right away stating that pt does not need a covid test now and the test was only suggested as an option and signed by Dr Einar Pheasant. Request cb to pt.

## 2018-11-25 NOTE — Telephone Encounter (Signed)
-----   Message from [name], MD sent at 11/25/2018  9:53 AM EDT ----- Pt with active lung cancer treatment now with worsening nighttime cough and low grade fever occasionally. Would like to rule-out covid-19

## 2018-11-25 NOTE — Telephone Encounter (Signed)
Spoke with patient, scheduled her for COVID 19 test today at 3:45 pm at [facility].  Testing protocol reviewed.

## 2018-11-25 NOTE — Telephone Encounter (Signed)
Pt left v/m that she still has a cough; pt not sleeping due to cough. Pt already scheduled with phone call today with Dr Einar Pheasant.

## 2018-11-25 NOTE — Progress Notes (Signed)
Virtual Visit via Telephone Note  I connected with Jamie Matthews on 11/25/18 at  9:40 AM EDT by telephone and verified that I am speaking with the correct person using two identifiers.   I discussed the limitations, risks, security and privacy concerns of performing an evaluation and management service by telephone and the availability of in person appointments. I also discussed with the patient that there may be a patient responsible charge related to this service. The patient expressed understanding and agreed to proceed.  Patient location: Home Provider Location: [place] Participants: Jamie Matthews and [place]   History of Present Illness: Chief Complaint  Patient presents with  . Cough    has been coughing worse the past few days. When she lays down she coughs more and has to set up. Phlegm is yellowish sometimes. Benzonatate medication not helping. Had temp of 99.5 2 days ago.   Cough This is a new problem. The current episode started in the past 7 days. The problem has been gradually worsening. Episode frequency: mostly at night. The cough is productive of sputum. Associated symptoms include a fever (slight fever Sunday), heartburn (intense on Sunday), myalgias, nasal congestion, postnasal drip and rhinorrhea. Pertinent negatives include no chills, ear congestion, ear pain, headaches, sore throat, shortness of breath or wheezing. The symptoms are aggravated by lying down. She has tried prescription cough suppressant (mucinex, cough drops) for the symptoms.   Cannot sleep at night  No know exposure to COVID-19 Has been staying at home  Has tried saline rinse in the past - but not recently  #GERD - is taking omeprazole/prilosec - did try a lot of different things on Sunday including mylanta     Observations/Objective: Temp (!) 96 F (35.6 C) Comment: per patient  Wt 161 lb (73 kg) Comment: per patient  BMI 28.52 kg/m     Exam:  Calm, speaking in complete sentences Occasional coughing Good mood   Assessment and Plan: Problem List Items Addressed This Visit    None    Visit Diagnoses    Postnasal drip    -  Primary   Relevant Medications   fluticasone (FLONASE) 50 MCG/ACT nasal spray   Heartburn       Relevant Medications   omeprazole (PRILOSEC) 40 MG capsule   Cough with sputum       Relevant Medications   fluticasone (FLONASE) 50 MCG/ACT nasal spray   omeprazole (PRILOSEC) 40 MG capsule     Suspect it may be multifactorial though with lung cancer and fever did discuss Covid-19 testing as patient high risk including ER precautions. Would like to avoid sedating cough medicines given age and comorbidity.     Patient Instructions  #Nighttime Cough - Saline rinse 2 times per day - Flonase nasal spray - start with 1 time per day, if noticing improvement can increase to 2 times per day for a short period of time - Increase Heartburn medication (omeprazole to 40 mg daily) - Covid-19 testing  Call back next week if still with significant nighttime coughing     Follow Up Instructions:  Return if symptoms worsen or fail to improve.   I discussed the assessment and treatment plan with the patient. The patient was provided an opportunity to ask questions and all were answered. The patient agreed with the plan and demonstrated an understanding of the instructions.   The patient was advised to call back or seek an in-person evaluation if the symptoms worsen or  if the condition fails to improve as anticipated.  I provided 15 minutes of non-face-to-face time during this encounter.   Jamie Noe, MD

## 2018-11-25 NOTE — Patient Instructions (Addendum)
#  Nighttime Cough - Saline rinse 2 times per day - Flonase nasal spray - start with 1 time per day, if noticing improvement can increase to 2 times per day for a short period of time - Increase Heartburn medication (omeprazole to 40 mg daily) - Covid-19 testing  Call back next week if still with significant nighttime coughing

## 2018-11-30 ENCOUNTER — Other Ambulatory Visit: Payer: Self-pay | Admitting: Family Medicine

## 2018-12-01 ENCOUNTER — Ambulatory Visit: Payer: Medicare Other | Admitting: Internal Medicine

## 2018-12-04 ENCOUNTER — Encounter: Payer: Self-pay | Admitting: Internal Medicine

## 2018-12-06 ENCOUNTER — Encounter: Payer: Self-pay | Admitting: *Deleted

## 2018-12-06 ENCOUNTER — Encounter: Payer: Self-pay | Admitting: Urology

## 2018-12-06 ENCOUNTER — Ambulatory Visit: Payer: Medicare Other | Admitting: Urology

## 2018-12-06 NOTE — Progress Notes (Signed)
Patient called and left me a message with complaints of lower extremity swelling and dizziness.  I returned the patients call to further assess her complaints.  States she has had lower extremity swelling for a couple of weeks and that Dr. Tasia Catchings had assessed her earlier.  States she feels it is getting worse.  States she is wearing support hose, and elevating her feet at night.  States her right leg/ankle and foot is worse that the left, and she has discomfort in the right.  States "it feels tight".  Denies any shortness or breath, or difficulty ambulating.  She does complain of "dizziness" when getting up.  States it has happened a couple of times over the past two days.  She does state it gets better within about 10 minutes of sitting back down.  Discussed orthostatic hypotension possibility with her and encouraged her to rise slowly.  Patient wants to know if there is anything she can take for the swelling.   I will send Dr. Tasia Catchings a message for recommendations / or symptom management clinic, and get back to the patient.

## 2018-12-07 ENCOUNTER — Other Ambulatory Visit: Payer: Self-pay

## 2018-12-07 ENCOUNTER — Encounter: Payer: Self-pay | Admitting: Oncology

## 2018-12-07 ENCOUNTER — Other Ambulatory Visit: Payer: Self-pay | Admitting: Oncology

## 2018-12-07 ENCOUNTER — Inpatient Hospital Stay: Payer: Medicare Other

## 2018-12-07 ENCOUNTER — Inpatient Hospital Stay: Payer: Medicare Other | Attending: Oncology | Admitting: Oncology

## 2018-12-07 VITALS — BP 105/58 | HR 55 | Temp 96.4°F | Resp 18 | Wt 151.6 lb

## 2018-12-07 DIAGNOSIS — Z961 Presence of intraocular lens: Secondary | ICD-10-CM | POA: Diagnosis not present

## 2018-12-07 DIAGNOSIS — C3490 Malignant neoplasm of unspecified part of unspecified bronchus or lung: Secondary | ICD-10-CM

## 2018-12-07 DIAGNOSIS — M8580 Other specified disorders of bone density and structure, unspecified site: Secondary | ICD-10-CM | POA: Insufficient documentation

## 2018-12-07 DIAGNOSIS — R600 Localized edema: Secondary | ICD-10-CM | POA: Diagnosis not present

## 2018-12-07 DIAGNOSIS — K219 Gastro-esophageal reflux disease without esophagitis: Secondary | ICD-10-CM | POA: Diagnosis not present

## 2018-12-07 DIAGNOSIS — Z7189 Other specified counseling: Secondary | ICD-10-CM

## 2018-12-07 DIAGNOSIS — C50912 Malignant neoplasm of unspecified site of left female breast: Secondary | ICD-10-CM

## 2018-12-07 DIAGNOSIS — R5383 Other fatigue: Secondary | ICD-10-CM | POA: Diagnosis not present

## 2018-12-07 DIAGNOSIS — C3412 Malignant neoplasm of upper lobe, left bronchus or lung: Secondary | ICD-10-CM | POA: Insufficient documentation

## 2018-12-07 DIAGNOSIS — Z79811 Long term (current) use of aromatase inhibitors: Secondary | ICD-10-CM | POA: Diagnosis not present

## 2018-12-07 DIAGNOSIS — Z8249 Family history of ischemic heart disease and other diseases of the circulatory system: Secondary | ICD-10-CM | POA: Diagnosis not present

## 2018-12-07 DIAGNOSIS — Z170 Estrogen receptor positive status [ER+]: Secondary | ICD-10-CM | POA: Insufficient documentation

## 2018-12-07 DIAGNOSIS — Z803 Family history of malignant neoplasm of breast: Secondary | ICD-10-CM | POA: Diagnosis not present

## 2018-12-07 DIAGNOSIS — E785 Hyperlipidemia, unspecified: Secondary | ICD-10-CM | POA: Diagnosis not present

## 2018-12-07 DIAGNOSIS — R7989 Other specified abnormal findings of blood chemistry: Secondary | ICD-10-CM

## 2018-12-07 DIAGNOSIS — Z79899 Other long term (current) drug therapy: Secondary | ICD-10-CM | POA: Diagnosis not present

## 2018-12-07 LAB — CBC WITH DIFFERENTIAL/PLATELET
Abs Immature Granulocytes: 0.03 10*3/uL (ref 0.00–0.07)
Basophils Absolute: 0 10*3/uL (ref 0.0–0.1)
Basophils Relative: 1 %
Eosinophils Absolute: 0.4 10*3/uL (ref 0.0–0.5)
Eosinophils Relative: 8 %
HCT: 31.7 % — ABNORMAL LOW (ref 36.0–46.0)
Hemoglobin: 10.2 g/dL — ABNORMAL LOW (ref 12.0–15.0)
Immature Granulocytes: 1 %
Lymphocytes Relative: 38 %
Lymphs Abs: 2.2 10*3/uL (ref 0.7–4.0)
MCH: 31.6 pg (ref 26.0–34.0)
MCHC: 32.2 g/dL (ref 30.0–36.0)
MCV: 98.1 fL (ref 80.0–100.0)
Monocytes Absolute: 0.8 10*3/uL (ref 0.1–1.0)
Monocytes Relative: 15 %
Neutro Abs: 2.2 10*3/uL (ref 1.7–7.7)
Neutrophils Relative %: 37 %
Platelets: 308 10*3/uL (ref 150–400)
RBC: 3.23 MIL/uL — ABNORMAL LOW (ref 3.87–5.11)
RDW: 14.2 % (ref 11.5–15.5)
WBC: 5.8 10*3/uL (ref 4.0–10.5)
nRBC: 0 % (ref 0.0–0.2)

## 2018-12-07 LAB — COMPREHENSIVE METABOLIC PANEL
ALT: 43 U/L (ref 0–44)
AST: 38 U/L (ref 15–41)
Albumin: 3 g/dL — ABNORMAL LOW (ref 3.5–5.0)
Alkaline Phosphatase: 126 U/L (ref 38–126)
Anion gap: 9 (ref 5–15)
BUN: 26 mg/dL — ABNORMAL HIGH (ref 8–23)
CO2: 26 mmol/L (ref 22–32)
Calcium: 8.1 mg/dL — ABNORMAL LOW (ref 8.9–10.3)
Chloride: 110 mmol/L (ref 98–111)
Creatinine, Ser: 1.16 mg/dL — ABNORMAL HIGH (ref 0.44–1.00)
GFR calc Af Amer: 51 mL/min — ABNORMAL LOW (ref >60)
GFR calc non Af Amer: 44 mL/min — ABNORMAL LOW (ref >60)
Glucose, Bld: 112 mg/dL — ABNORMAL HIGH (ref 70–99)
Potassium: 4 mmol/L (ref 3.5–5.1)
Sodium: 145 mmol/L (ref 135–145)
Total Bilirubin: 0.4 mg/dL (ref 0.3–1.2)
Total Protein: 6 g/dL — ABNORMAL LOW (ref 6.5–8.1)

## 2018-12-07 MED ORDER — FUROSEMIDE 20 MG PO TABS: 20.0000 mg | ORAL_TABLET | Freq: Every day | ORAL | 0 refills | Status: DC | PRN

## 2018-12-07 NOTE — Progress Notes (Signed)
Hematology/Oncology follow up note [name] [phone] [phone]   Patient Care Team: [name], MD as PCP - General (Family Medicine) Osborne Oman, MD as Consulting Physician (Obstetrics and Gynecology) Pyrtle, Lajuan Lines, MD as Consulting Physician (Gastroenterology) Telford Nab, RN as Registered Nurse   REASON FOR VISIT:  Evaluation of breast cancer  HISTORY OF PRESENTING ILLNESS:  Jamie Matthews is a  78 y.o.  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 05/05/2018 bilateral diagnostic breast mammogram showed suspicious mass 1.1cm  in the 12:00 retroareolar region of the left breast and the left axillary lymph node. 06/08/2018 patient status post a left breast retroareolar and left axillary lymph node biopsy. Pathology showed invasive mammary carcinoma, no special type, grade 1, left axillary lymph node positive for invasive mammary carcinoma clinically metastatic.  Background lymph node architecture is not identified. ER> 90% PR> 90%, HER-2 negative.  PET scan done which unfortunately showed additional hypermetabolic bilateral hilar lymph nodes as well as left upper lobe lung mass which may represent a focus of metastatic disease from breast, or primary lung neoplasm.  There is multiple additional small pulmonary nodules are scattered throughout both lungs which are worrisome for metastasis.  Index nodule within the medial right upper lobe measures 7 mm,, peri-broncho-vascular nodule in the right lower lobe measures 1.2 cm.  # Stage IV lung cancer-  Biopsy of lung mass left upper lobe showed non-small cell lung cancer, favor adenocarcinoma.  #NGS came back patient has PD L1 is 70% TPS, MET fusion mutation.  #Mid-March 2020 started on crizotinib  INTERVAL HISTORY Jamie Matthews is a 78 y.o. female who has above history reviewed by me today presents for follow up  visit for evaluation of tolerability of treatment for breast cancer and Stage IV lung cancer. Patient called and reports worsening of bilateral lower extremity edema request to be evaluated. Bilateral lower extremity edema, right worse than left, gradual onset, no aggravating or alleviating factors. Denies any calf tenderness.  Patient takes crizotinib and tolerates well.  She is also on Arimidex.  Denies any hot flashes or joint achiness.  Tolerating well. No other new complaints.  Denies any shortness of breath or chest pain.   Review of Systems  Constitutional: Positive for fatigue. Negative for appetite change, chills and fever.  HENT:   Negative for hearing loss and voice change.   Eyes: Negative for eye problems.  Respiratory: Negative for chest tightness and cough.   Cardiovascular: Positive for leg swelling. Negative for chest pain.  Gastrointestinal: Negative for abdominal distention, abdominal pain and blood in stool.  Endocrine: Negative for hot flashes.  Genitourinary: Negative for difficulty urinating and frequency.   Musculoskeletal: Negative for arthralgias.  Skin: Negative for itching and rash.  Neurological: Negative for extremity weakness.  Hematological: Negative for adenopathy.  Psychiatric/Behavioral: Negative for confusion.    MEDICAL HISTORY:  Past Medical History:  Diagnosis Date   AKI (acute kidney injury) ([place]) 08/17/2018   Anxiety    Arthritis    Belching    Bladder disorder    OVERACTIVE   Bowel dysfunction    BLOCKAGE   Cancer (HCC)    breast   Constipation    Depression    Diverticulitis    Fibromyalgia    GERD (gastroesophageal reflux disease)    Hyperlipidemia    IBS (irritable bowel syndrome)    Internal hemorrhoids    Memory deficits  Murmur    asymptomatic   Pneumonia 11/18/12   Urinary incontinence    Vertigo     SURGICAL HISTORY: Past Surgical History:  Procedure Laterality Date   AXILLARY LYMPH NODE  BIOPSY Left 06/08/2018   INVASIVE MAMMARY CARCINOMA   BLADDER SUSPENSION  2004, 2012   BREAST BIOPSY Left 06/08/2018   INVASIVE MAMMARY CARCINOMA   CATARACT EXTRACTION W/PHACO Right 08/27/2015   Procedure: CATARACT EXTRACTION PHACO AND INTRAOCULAR LENS PLACEMENT (IOC);  Surgeon: Estill Cotta, MD;  Location: [facility] ORS;  Service: Ophthalmology;  Laterality: Right;  [place]   1:00.2 AP%  22.5 CDE  23.67 fluid casette lot #[id number]  exp05/31/2018   CATARACT EXTRACTION W/PHACO Left 10/15/2015   Procedure: CATARACT EXTRACTION PHACO AND INTRAOCULAR LENS PLACEMENT (IOC);  Surgeon: Estill Cotta, MD;  Location: [facility] ORS;  Service: Ophthalmology;  Laterality: Left;  [place] 01:07 AP% 18.1 CDE 21.57 fluid pack lot # [id number]   CHOLECYSTECTOMY     COLONOSCOPY  2017   ELECTROMAGNETIC NAVIGATION BROCHOSCOPY N/A 07/09/2018   Procedure: ELECTROMAGNETIC NAVIGATION BRONCHOSCOPY;  Surgeon: Flora Lipps, MD;  Location: [facility] ORS;  Service: Cardiopulmonary;  Laterality: N/A;   TONSILLECTOMY  1947    SOCIAL HISTORY: Social History   Socioeconomic History   Marital status: Married    Spouse name: Scientist, forensic   Number of children: 0   Years of education: Master's Degree   Highest education level: Not on file  Occupational History   Occupation: Retired  Scientist, product/process development strain: Not hard at [employer]    Worry: Not on file    Inability: Not on Lexicographer needs    Medical: Not on file    Non-medical: Not on file  Tobacco Use   Smoking status: Never Smoker   Smokeless tobacco: Never Used  Substance and Sexual Activity   Alcohol use: Yes    Comment: rare wine   Drug use: No   Sexual activity: Not Currently  Lifestyle   Physical activity    Days per week: Not on file    Minutes per session: Not on file   Stress: Not on file  Relationships   Social connections    Talks on phone: Not on file    Gets together: Not on file    Attends  religious service: Not on file    Active member of club or organization: Not on file    Attends meetings of clubs or organizations: Not on file    Relationship status: Not on file   Intimate partner violence    Fear of current or ex partner: Not on file    Emotionally abused: Not on file    Physically abused: Not on file    Forced sexual activity: Not on file  Other Topics Concern   Not on file  Social History Narrative   Recently moved with her husband to [place] from [place].   Husband is a retired Pharmacist, community.   No children.   She is a retired Pharmacist, hospital.   Enjoys: Chief Technology Officer, reading - mysteries and biographies, cooking   Exercise: walking, gardening   Diet: low appetite due to cancer treatment, grazing   She is a DNR.    FAMILY HISTORY: Family History  Problem Relation Age of Onset   Diabetes Father    Heart disease Father    Lymphoma Father    Heart disease Mother    Breast cancer Sister 49   Colon cancer Neg Hx  Esophageal cancer Neg Hx    Rectal cancer Neg Hx    Stomach cancer Neg Hx    Bladder Cancer Neg Hx    Kidney cancer Neg Hx     ALLERGIES:  is allergic to sulfa antibiotics.  MEDICATIONS:  Current Outpatient Medications  Medication Sig Dispense Refill   acetaminophen (TYLENOL) 325 MG tablet Take 325 mg by mouth every 6 (six) hours as needed for mild pain.      anastrozole (ARIMIDEX) 1 MG tablet TAKE ONE TABLET EVERY DAY 30 tablet 3   atorvastatin (LIPITOR) 20 MG tablet Take 1 tablet (20 mg total) by mouth at bedtime. 90 tablet 3   benzonatate (TESSALON) 100 MG capsule Take 1 capsule (100 mg total) by mouth 3 (three) times daily as needed for cough. 30 capsule 1   buPROPion (WELLBUTRIN XL) 150 MG 24 hr tablet TAKE ONE TABLET BY MOUTH EVERY DAY (Patient taking differently: Take 150 mg by mouth daily. ) 90 tablet 2   desonide (DESOWEN) 0.05 % lotion Apply 1 application topically as needed (after showering).       Dextromethorphan-guaiFENesin (MUCINEX DM) 30-600 MG TB12 Take 1 tablet by mouth 2 (two) times daily as needed (for congestion/cough).     diphenhydrAMINE (BENADRYL) 25 MG tablet Take 25 mg by mouth at bedtime as needed for allergies.      docusate sodium (COLACE) 100 MG capsule Take 1 capsule (100 mg total) by mouth 2 (two) times daily. 60 capsule 0   Fesoterodine Fumarate (TOVIAZ PO) Take 1 tablet by mouth daily.     fluticasone (FLONASE) 50 MCG/ACT nasal spray Place 2 sprays into both nostrils daily. 16 g 6   ketoconazole (NIZORAL) 2 % shampoo Apply 1 application topically. 3 times a week     lubiprostone (AMITIZA) 24 MCG capsule TAKE ONE CAPSULE TWICE A DAY WITH MEALS (Patient taking differently: Take 24 mcg by mouth 2 (two) times daily. ) 60 capsule 3   memantine (NAMENDA) 10 MG tablet TAKE ONE TABLET BY MOUTH TWICE DAILY (Patient taking differently: Take 10 mg by mouth 2 (two) times daily. ) 180 tablet 2   Multiple Vitamin (MULTIVITAMIN WITH MINERALS) TABS tablet Take 1 tablet by mouth daily.     MYRBETRIQ 50 MG TB24 tablet TAKE 1 TABLET BY MOUTH DAILY (Patient taking differently: Take 50 mg by mouth daily. ) 90 tablet 1   omeprazole (PRILOSEC) 40 MG capsule Take 1 capsule (40 mg total) by mouth daily. 30 capsule 1   ondansetron (ZOFRAN) 8 MG tablet Take 8 mg by mouth every 8 (eight) hours as needed for nausea.     polyethylene glycol (MIRALAX / GLYCOLAX) packet Take 17 g by mouth daily.     PROMETHAZINE-DM PO Take 1 capsule by mouth 4 (four) times daily as needed for cough.      triamcinolone (NASACORT) 55 MCG/ACT AERO nasal inhaler Place 2 sprays into the nose daily as needed (for congestion).      venlafaxine XR (EFFEXOR-XR) 150 MG 24 hr capsule TAKE 1 CAPSULE BY MOUTH DAILY (Patient taking differently: Take 150 mg by mouth daily with breakfast. ) 90 capsule 1   DEXILANT 60 MG capsule TAKE 1 CAPSULE BY MOUTH ONCE DAILY (Patient not taking: No sig reported) 90 capsule 0    furosemide (LASIX) 20 MG tablet Take 1 tablet (20 mg total) by mouth daily as needed. 30 tablet 0   solifenacin (VESICARE) 5 MG tablet Take 1 tablet (5 mg total) by mouth daily. (Patient  not taking: Reported on 12/07/2018) 30 tablet 3   XALKORI 250 MG capsule TAKE 1 CAPSULE (250 MG TOTAL) BY MOUTH 2 TIMES DAILY. 60 capsule 0   No current facility-administered medications for this visit.      PHYSICAL EXAMINATION: ECOG PERFORMANCE STATUS: 2 - Symptomatic, <50% confined to bed Vitals:   12/07/18 1014  BP: (!) 105/58  Pulse: (!) 55  Resp: 18  Temp: (!) 96.4 F (35.8 C)   Filed Weights   12/07/18 1014  Weight: 151 lb 9.6 oz (68.8 kg)    Physical Exam  Constitutional: She is oriented to person, place, and time. No distress.  Frail female, walk in  with walker.  HENT:  Head: Normocephalic and atraumatic.  Nose: Nose normal.  Mouth/Throat: Oropharynx is clear and moist. No oropharyngeal exudate.  Eyes: Pupils are equal, round, and reactive to light. EOM are normal. No scleral icterus.  Neck: Normal range of motion. Neck supple.  Cardiovascular: Normal rate and regular rhythm.  No murmur heard. Pulmonary/Chest: Effort normal. No respiratory distress. She has no rales. She exhibits no tenderness.  Abdominal: Soft. She exhibits no distension. There is no abdominal tenderness.  Musculoskeletal: Normal range of motion.        General: Edema present.     Comments: Bilateral lower extremity 3+ edema, right worse than left.   Neurological: She is alert and oriented to person, place, and time. No cranial nerve deficit. She exhibits normal muscle tone. Coordination normal.  Skin: Skin is warm and dry. She is not diaphoretic. No erythema.  Psychiatric: Affect normal.     CMP Latest Ref Rng & Units 12/07/2018  Glucose 70 - 99 mg/dL 112(H)  BUN 8 - 23 mg/dL 26(H)  Creatinine 0.44 - 1.00 mg/dL 1.16(H)  Sodium 135 - 145 mmol/L 145  Potassium 3.5 - 5.1 mmol/L 4.0  Chloride 98 - 111 mmol/L  110  CO2 22 - 32 mmol/L 26  Calcium 8.9 - 10.3 mg/dL 8.1(L)  Total Protein 6.5 - 8.1 g/dL 6.0(L)  Total Bilirubin 0.3 - 1.2 mg/dL 0.4  Alkaline Phos 38 - 126 U/L 126  AST 15 - 41 U/L 38  ALT 0 - 44 U/L 43   CBC Latest Ref Rng & Units 12/07/2018  WBC 4.0 - 10.5 K/uL 5.8  Hemoglobin 12.0 - 15.0 g/dL 10.2(L)  Hematocrit 36.0 - 46.0 % 31.7(L)  Platelets 150 - 400 K/uL 308    LABORATORY DATA:  I have reviewed the data as listed Lab Results  Component Value Date   WBC 5.8 12/07/2018   HGB 10.2 (L) 12/07/2018   HCT 31.7 (L) 12/07/2018   MCV 98.1 12/07/2018   PLT 308 12/07/2018   Recent Labs    10/21/18 0958 11/18/18 1024 12/07/18 0934  NA 139 141 145  K 4.5 4.2 4.0  CL 107 106 110  CO2 _0 GLUCOSE 135* 117* 112*  BUN 25* 29* 26*  CREATININE 1.16* 1.23* 1.16*  CALCIUM 7.8* 8.3* 8.1*  GFRNONAA 44* 41* 44*  GFRAA 51* 48* 51*  PROT 5.8* 6.0* 6.0*  ALBUMIN 3.0* 3.2* 3.0*  AST 32 33 38  ALT 34 37 43  ALKPHOS 128* 137* 126  BILITOT 0.5 0.6 0.4   Iron/TIBC/Ferritin/ %Sat No results found for: IRON, TIBC, FERRITIN, IRONPCTSAT   RADIOGRAPHIC STUDIES: I have personally reviewed the radiological images as listed and agreed with the findings in the report.  06/21/2018 PET scan.  1. Left subareolar breast lesion with biopsy clip exhibits  mild increased uptake with enlarged and hypermetabolic left axillary lymph node. 2. There is a dominant left upper lobe lung mass which exhibits moderate increased uptake and may represent a focus of metastatic disease from breast primary or primary lung neoplasm. Multiple additional small pulmonary nodules are scattered throughout both lungs which are worrisome for metastasis. 3. Hypermetabolic bilateral hilar lymph nodes concerning for metastatic disease. 4.  Aortic Atherosclerosis (ICD10-I70.0).  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  10/19/2018 CT chest with contrast Dominant left upper lobe nodule has  decreased in size slightly in the interval.  Additional smaller bilateral spiculated pulmonary nodules are stable. Borderline right hilar and left axillary adenopathy.  Stable to minimally decreased in size. Mild T1 11 compression deformity is new. Aortic atherosclerosis CAD.    ASSESSMENT & PLAN:  1. Lower leg edema   2. Primary adenocarcinoma of lung, unspecified laterality ([place])   3. Malignant neoplasm of left female breast, unspecified estrogen receptor status, unspecified site of breast ([place])   4. Use of aromatase inhibitors   5. Osteopenia, unspecified location   Patient has 2 primaries.  She Stage IV lung adenocarcinoma, met fusion mutation cT2 N3 M1a PDL 1 TPS more than 70%   #Bilateral lower extremity edema, right >left,  Ultrasound venous right 09/20/2018 no DVT.  2D echo 10/10/2018 showed LVEF 55 to 60% Most likely secondary to crizotinib side effects. Patient has tried compression stocking which did not help. Recommend starting Lasix 20 mg daily as needed as needed. Side effects of Lasix including hypotension, electrolyte imbalance, worsening of kidney function, dehydration, sudden death discussed with patient.Recommend patient to monitor blood pressure at home, and also call if she experience any discomfort. Repeat BMP in 3 days.  Otherwise, she tolerated crizotinib pretty well.  Continue. Proceed with CT scanning as scheduled. Continue Arimidex.  Osteopenia, in the context of Arimidex.  Fall risk, history of pelvic fracture. Awaiting dental clearance to start treatments. Continue calcium and vitamin D   All questions were answered. The patient knows to call the clinic with any problems questions or concerns.  Return of visit: Keep scheduled appointments.   Earlie Server, MD, PhD  12/07/2018

## 2018-12-07 NOTE — Progress Notes (Signed)
Patient would like for MD to look at her lower extremity edema with right let worse than the left.

## 2018-12-10 ENCOUNTER — Inpatient Hospital Stay: Payer: Medicare Other

## 2018-12-10 ENCOUNTER — Other Ambulatory Visit: Payer: Self-pay

## 2018-12-10 DIAGNOSIS — R600 Localized edema: Secondary | ICD-10-CM | POA: Diagnosis not present

## 2018-12-10 DIAGNOSIS — C3412 Malignant neoplasm of upper lobe, left bronchus or lung: Secondary | ICD-10-CM | POA: Diagnosis not present

## 2018-12-10 DIAGNOSIS — M8580 Other specified disorders of bone density and structure, unspecified site: Secondary | ICD-10-CM | POA: Diagnosis not present

## 2018-12-10 DIAGNOSIS — Z79811 Long term (current) use of aromatase inhibitors: Secondary | ICD-10-CM | POA: Diagnosis not present

## 2018-12-10 DIAGNOSIS — C3490 Malignant neoplasm of unspecified part of unspecified bronchus or lung: Secondary | ICD-10-CM

## 2018-12-10 DIAGNOSIS — C50912 Malignant neoplasm of unspecified site of left female breast: Secondary | ICD-10-CM | POA: Diagnosis not present

## 2018-12-10 DIAGNOSIS — Z170 Estrogen receptor positive status [ER+]: Secondary | ICD-10-CM | POA: Diagnosis not present

## 2018-12-10 LAB — BASIC METABOLIC PANEL
Anion gap: 9 (ref 5–15)
BUN: 31 mg/dL — ABNORMAL HIGH (ref 8–23)
CO2: 26 mmol/L (ref 22–32)
Calcium: 8.2 mg/dL — ABNORMAL LOW (ref 8.9–10.3)
Chloride: 102 mmol/L (ref 98–111)
Creatinine, Ser: 1.4 mg/dL — ABNORMAL HIGH (ref 0.44–1.00)
GFR calc Af Amer: 41 mL/min — ABNORMAL LOW (ref >60)
GFR calc non Af Amer: 35 mL/min — ABNORMAL LOW (ref >60)
Glucose, Bld: 102 mg/dL — ABNORMAL HIGH (ref 70–99)
Potassium: 4.5 mmol/L (ref 3.5–5.1)
Sodium: 137 mmol/L (ref 135–145)

## 2018-12-14 MED FILL — XALKORI 250 MG CAPSULE: 250 | 30 days supply | Qty: 60 | Fill #0

## 2018-12-15 ENCOUNTER — Other Ambulatory Visit: Payer: Self-pay

## 2018-12-15 ENCOUNTER — Ambulatory Visit
Admission: RE | Admit: 2018-12-15 | Discharge: 2018-12-15 | Disposition: A | Discharge status: Home / Self Care | Payer: Medicare Other | Source: Ambulatory Visit | Attending: Oncology | Admitting: Oncology

## 2018-12-15 ENCOUNTER — Ambulatory Visit: Admission: RE | Admit: 2018-12-15 | Payer: Medicare Other | Source: Ambulatory Visit

## 2018-12-15 DIAGNOSIS — C50919 Malignant neoplasm of unspecified site of unspecified female breast: Secondary | ICD-10-CM | POA: Diagnosis not present

## 2018-12-15 DIAGNOSIS — C3490 Malignant neoplasm of unspecified part of unspecified bronchus or lung: Secondary | ICD-10-CM | POA: Insufficient documentation

## 2018-12-17 ENCOUNTER — Encounter: Payer: Self-pay | Admitting: Oncology

## 2018-12-17 ENCOUNTER — Other Ambulatory Visit: Payer: Self-pay

## 2018-12-17 ENCOUNTER — Inpatient Hospital Stay: Payer: Medicare Other

## 2018-12-17 ENCOUNTER — Inpatient Hospital Stay (HOSPITAL_BASED_OUTPATIENT_CLINIC_OR_DEPARTMENT_OTHER): Payer: Medicare Other | Admitting: Oncology

## 2018-12-17 VITALS — BP 115/53 | HR 55 | Temp 96.7°F | Resp 18 | Wt 149.5 lb

## 2018-12-17 DIAGNOSIS — Z79811 Long term (current) use of aromatase inhibitors: Secondary | ICD-10-CM | POA: Diagnosis not present

## 2018-12-17 DIAGNOSIS — K219 Gastro-esophageal reflux disease without esophagitis: Secondary | ICD-10-CM

## 2018-12-17 DIAGNOSIS — Z8249 Family history of ischemic heart disease and other diseases of the circulatory system: Secondary | ICD-10-CM

## 2018-12-17 DIAGNOSIS — Z803 Family history of malignant neoplasm of breast: Secondary | ICD-10-CM | POA: Diagnosis not present

## 2018-12-17 DIAGNOSIS — Z79899 Other long term (current) drug therapy: Secondary | ICD-10-CM

## 2018-12-17 DIAGNOSIS — R600 Localized edema: Secondary | ICD-10-CM

## 2018-12-17 DIAGNOSIS — R5383 Other fatigue: Secondary | ICD-10-CM | POA: Diagnosis not present

## 2018-12-17 DIAGNOSIS — E785 Hyperlipidemia, unspecified: Secondary | ICD-10-CM

## 2018-12-17 DIAGNOSIS — C50912 Malignant neoplasm of unspecified site of left female breast: Secondary | ICD-10-CM | POA: Diagnosis not present

## 2018-12-17 DIAGNOSIS — C3490 Malignant neoplasm of unspecified part of unspecified bronchus or lung: Secondary | ICD-10-CM

## 2018-12-17 DIAGNOSIS — Z170 Estrogen receptor positive status [ER+]: Secondary | ICD-10-CM

## 2018-12-17 DIAGNOSIS — C3412 Malignant neoplasm of upper lobe, left bronchus or lung: Secondary | ICD-10-CM

## 2018-12-17 DIAGNOSIS — Z7189 Other specified counseling: Secondary | ICD-10-CM

## 2018-12-17 DIAGNOSIS — R7989 Other specified abnormal findings of blood chemistry: Secondary | ICD-10-CM

## 2018-12-17 DIAGNOSIS — M8580 Other specified disorders of bone density and structure, unspecified site: Secondary | ICD-10-CM | POA: Diagnosis not present

## 2018-12-17 DIAGNOSIS — Z5111 Encounter for antineoplastic chemotherapy: Secondary | ICD-10-CM | POA: Insufficient documentation

## 2018-12-17 LAB — COMPREHENSIVE METABOLIC PANEL
ALT: 35 U/L (ref 0–44)
AST: 30 U/L (ref 15–41)
Albumin: 3.3 g/dL — ABNORMAL LOW (ref 3.5–5.0)
Alkaline Phosphatase: 127 U/L — ABNORMAL HIGH (ref 38–126)
Anion gap: 7 (ref 5–15)
BUN: 21 mg/dL (ref 8–23)
CO2: 29 mmol/L (ref 22–32)
Calcium: 8.4 mg/dL — ABNORMAL LOW (ref 8.9–10.3)
Chloride: 105 mmol/L (ref 98–111)
Creatinine, Ser: 1.14 mg/dL — ABNORMAL HIGH (ref 0.44–1.00)
GFR calc Af Amer: 53 mL/min — ABNORMAL LOW (ref >60)
GFR calc non Af Amer: 45 mL/min — ABNORMAL LOW (ref >60)
Glucose, Bld: 98 mg/dL (ref 70–99)
Potassium: 4.1 mmol/L (ref 3.5–5.1)
Sodium: 141 mmol/L (ref 135–145)
Total Bilirubin: 0.5 mg/dL (ref 0.3–1.2)
Total Protein: 6 g/dL — ABNORMAL LOW (ref 6.5–8.1)

## 2018-12-17 LAB — CBC WITH DIFFERENTIAL/PLATELET
Abs Immature Granulocytes: 0.05 10*3/uL (ref 0.00–0.07)
Basophils Absolute: 0 10*3/uL (ref 0.0–0.1)
Basophils Relative: 1 %
Eosinophils Absolute: 0.4 10*3/uL (ref 0.0–0.5)
Eosinophils Relative: 8 %
HCT: 34.1 % — ABNORMAL LOW (ref 36.0–46.0)
Hemoglobin: 11 g/dL — ABNORMAL LOW (ref 12.0–15.0)
Immature Granulocytes: 1 %
Lymphocytes Relative: 38 %
Lymphs Abs: 2 10*3/uL (ref 0.7–4.0)
MCH: 32 pg (ref 26.0–34.0)
MCHC: 32.3 g/dL (ref 30.0–36.0)
MCV: 99.1 fL (ref 80.0–100.0)
Monocytes Absolute: 1.2 10*3/uL — ABNORMAL HIGH (ref 0.1–1.0)
Monocytes Relative: 22 %
Neutro Abs: 1.6 10*3/uL — ABNORMAL LOW (ref 1.7–7.7)
Neutrophils Relative %: 30 %
Platelets: 309 10*3/uL (ref 150–400)
RBC: 3.44 MIL/uL — ABNORMAL LOW (ref 3.87–5.11)
RDW: 14.4 % (ref 11.5–15.5)
WBC: 5.3 10*3/uL (ref 4.0–10.5)
nRBC: 0 % (ref 0.0–0.2)

## 2018-12-17 NOTE — Progress Notes (Signed)
Hematology/Oncology follow up note [facility] [phone] [phone]   Patient Care Team: [name], MD as PCP - General (Family Medicine) Osborne Oman, MD as Consulting Physician (Obstetrics and Gynecology) Pyrtle, Lajuan Lines, MD as Consulting Physician (Gastroenterology) Telford Nab, RN as Registered Nurse   REASON FOR VISIT:  Follow up for management of lung cancer and breast cancer  HISTORY OF PRESENTING ILLNESS:  Jamie Matthews is a  78 y.o.  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 05/05/2018 bilateral diagnostic breast mammogram showed suspicious mass 1.1cm  in the 12:00 retroareolar region of the left breast and the left axillary lymph node. 06/08/2018 patient status post a left breast retroareolar and left axillary lymph node biopsy. Pathology showed invasive mammary carcinoma, no special type, grade 1, left axillary lymph node positive for invasive mammary carcinoma clinically metastatic.  Background lymph node architecture is not identified. ER> 90% PR> 90%, HER-2 negative.  PET scan done which unfortunately showed additional hypermetabolic bilateral hilar lymph nodes as well as left upper lobe lung mass which may represent a focus of metastatic disease from breast, or primary lung neoplasm.  There is multiple additional small pulmonary nodules are scattered throughout both lungs which are worrisome for metastasis.  Index nodule within the medial right upper lobe measures 7 mm,, peri-broncho-vascular nodule in the right lower lobe measures 1.2 cm.  # Stage IV lung cancer-  Biopsy of lung mass left upper lobe showed non-small cell lung cancer, favor adenocarcinoma.  #NGS came back patient has PD L1 is 70% TPS, MET fusion mutation.  #Mid-March 2020 started on crizotinib  Bilateral lower extremity edema, right >left, Ultrasound venous right 09/20/2018 no DVT.  2D echo  10/10/2018 showed LVEF 55 to 60% Most likely secondary to crizotinib side effects.  INTERVAL HISTORY Jamie Matthews is a 78 y.o. female who has above history reviewed by me today presents for follow up visit for evaluation of tolerability of treatment for breast cancer and Stage IV lung cancer. Patient has been on crizotinib, tolerating well except bilateral lower extremity edema She uses compression stocking. She has tried Lasix which worsened her kidney function, felt did not relieve her symptoms.  Of Lasix. Denies any calf tenderness. She is also on Arimidex.  Denies hot flashes or joint achiness. No other new complaints. Denies any shortness of breath or chest pain . Review of Systems  Constitutional: Positive for fatigue. Negative for appetite change, chills and fever.  HENT:   Negative for hearing loss and voice change.   Eyes: Negative for eye problems.  Respiratory: Negative for chest tightness and cough.   Cardiovascular: Positive for leg swelling. Negative for chest pain.  Gastrointestinal: Negative for abdominal distention, abdominal pain and blood in stool.  Endocrine: Negative for hot flashes.  Genitourinary: Negative for difficulty urinating and frequency.   Musculoskeletal: Negative for arthralgias.  Skin: Negative for itching and rash.  Neurological: Negative for extremity weakness.  Hematological: Negative for adenopathy.  Psychiatric/Behavioral: Negative for confusion.    MEDICAL HISTORY:  Past Medical History:  Diagnosis Date  . AKI (acute kidney injury) ([place]) 08/17/2018  . Anxiety   . Arthritis   . Belching   . Bladder disorder    OVERACTIVE  . Bowel dysfunction    BLOCKAGE  . Cancer (HCC)    breast  . Constipation   . Depression   . Diverticulitis   . Fibromyalgia   . GERD (gastroesophageal  reflux disease)   . Hyperlipidemia   . IBS (irritable bowel syndrome)   . Internal hemorrhoids   . Memory deficits   . Murmur    asymptomatic   . Pneumonia 11/18/12  . Urinary incontinence   . Vertigo     SURGICAL HISTORY: Past Surgical History:  Procedure Laterality Date  . AXILLARY LYMPH NODE BIOPSY Left 06/08/2018   INVASIVE MAMMARY CARCINOMA  . BLADDER SUSPENSION  2004, 2012  . BREAST BIOPSY Left 06/08/2018   INVASIVE MAMMARY CARCINOMA  . CATARACT EXTRACTION W/PHACO Right 08/27/2015   Procedure: CATARACT EXTRACTION PHACO AND INTRAOCULAR LENS PLACEMENT (IOC);  Surgeon: Estill Cotta, MD;  Location: [facility] ORS;  Service: Ophthalmology;  Laterality: Right;  [place]   1:00.2 AP%  22.5 CDE  23.67 fluid casette lot #[id number]  exp05/31/2018  . CATARACT EXTRACTION W/PHACO Left 10/15/2015   Procedure: CATARACT EXTRACTION PHACO AND INTRAOCULAR LENS PLACEMENT (IOC);  Surgeon: Estill Cotta, MD;  Location: [facility] ORS;  Service: Ophthalmology;  Laterality: Left;  [place] 01:07 AP% 18.1 CDE 21.57 fluid pack lot # [id number]  . CHOLECYSTECTOMY    . COLONOSCOPY  2017  . ELECTROMAGNETIC NAVIGATION BROCHOSCOPY N/A 07/09/2018   Procedure: ELECTROMAGNETIC NAVIGATION BRONCHOSCOPY;  Surgeon: Flora Lipps, MD;  Location: [facility] ORS;  Service: Cardiopulmonary;  Laterality: N/A;  . TONSILLECTOMY  1947    SOCIAL HISTORY: Social History   Socioeconomic History  . Marital status: Married    Spouse name: Jamie Matthews  . Number of children: 0  . Years of education: Master's Degree  . Highest education level: Not on file  Occupational History  . Occupation: Retired  Scientific laboratory technician  . Financial resource strain: Not hard at all  . Food insecurity    Worry: Not on file    Inability: Not on file  . Transportation needs    Medical: Not on file    Non-medical: Not on file  Tobacco Use  . Smoking status: Never Smoker  . Smokeless tobacco: Never Used  Substance and Sexual Activity  . Alcohol use: Yes    Comment: rare wine  . Drug use: No  . Sexual activity: Not Currently  Lifestyle  . Physical activity    Days per week: Not on file    Minutes per  session: Not on file  . Stress: Not on file  Relationships  . Social Herbalist on phone: Not on file    Gets together: Not on file    Attends religious service: Not on file    Active member of club or organization: Not on file    Attends meetings of clubs or organizations: Not on file    Relationship status: Not on file  . Intimate partner violence    Fear of current or ex partner: Not on file    Emotionally abused: Not on file    Physically abused: Not on file    Forced sexual activity: Not on file  Other Topics Concern  . Not on file  Social History Narrative   Recently moved with her husband to [place] from [place].   Husband is a retired Pharmacist, community.   No children.   She is a retired Pharmacist, hospital.   Enjoys: Chief Technology Officer, reading - mysteries and biographies, cooking   Exercise: walking, gardening   Diet: low appetite due to cancer treatment, grazing   She is a DNR.    FAMILY HISTORY: Family History  Problem Relation Age of Onset  . Diabetes Father   . Heart disease  Father   . Lymphoma Father   . Heart disease Mother   . Breast cancer Sister 48  . Colon cancer Neg Hx   . Esophageal cancer Neg Hx   . Rectal cancer Neg Hx   . Stomach cancer Neg Hx   . Bladder Cancer Neg Hx   . Kidney cancer Neg Hx     ALLERGIES:  is allergic to sulfa antibiotics.  MEDICATIONS:  Current Outpatient Medications  Medication Sig Dispense Refill  . acetaminophen (TYLENOL) 325 MG tablet Take 325 mg by mouth every 6 (six) hours as needed for mild pain.     [name] anastrozole (ARIMIDEX) 1 MG tablet TAKE ONE TABLET EVERY DAY 30 tablet 3  . atorvastatin (LIPITOR) 20 MG tablet Take 1 tablet (20 mg total) by mouth at bedtime. 90 tablet 3  . buPROPion (WELLBUTRIN XL) 150 MG 24 hr tablet TAKE ONE TABLET BY MOUTH EVERY DAY (Patient taking differently: Take 150 mg by mouth daily. ) 90 tablet 2  . desonide (DESOWEN) 0.05 % lotion Apply 1 application topically as needed (after showering).     .  diphenhydrAMINE (BENADRYL) 25 MG tablet Take 25 mg by mouth at bedtime as needed for allergies.     [name] docusate sodium (COLACE) 100 MG capsule Take 1 capsule (100 mg total) by mouth 2 (two) times daily. 60 capsule 0  . fluticasone (FLONASE) 50 MCG/ACT nasal spray Place 2 sprays into both nostrils daily. 16 g 6  . ketoconazole (NIZORAL) 2 % shampoo Apply 1 application topically. 3 times a week    . lubiprostone (AMITIZA) 24 MCG capsule TAKE ONE CAPSULE TWICE A DAY WITH MEALS (Patient taking differently: Take 24 mcg by mouth 2 (two) times daily. ) 60 capsule 3  . memantine (NAMENDA) 10 MG tablet TAKE ONE TABLET BY MOUTH TWICE DAILY (Patient taking differently: Take 10 mg by mouth 2 (two) times daily. ) 180 tablet 2  . Multiple Vitamin (MULTIVITAMIN WITH MINERALS) TABS tablet Take 1 tablet by mouth daily.    [name] MYRBETRIQ 50 MG TB24 tablet TAKE 1 TABLET BY MOUTH DAILY (Patient taking differently: Take 50 mg by mouth daily. ) 90 tablet 1  . omeprazole (PRILOSEC) 40 MG capsule Take 1 capsule (40 mg total) by mouth daily. 30 capsule 1  . polyethylene glycol (MIRALAX / GLYCOLAX) packet Take 17 g by mouth daily.    [name] triamcinolone (NASACORT) 55 MCG/ACT AERO nasal inhaler Place 2 sprays into the nose daily as needed (for congestion).     . venlafaxine XR (EFFEXOR-XR) 150 MG 24 hr capsule TAKE 1 CAPSULE BY MOUTH DAILY (Patient taking differently: Take 150 mg by mouth daily with breakfast. ) 90 capsule 1  . XALKORI 250 MG capsule TAKE 1 CAPSULE (250 MG TOTAL) BY MOUTH 2 TIMES DAILY. 60 capsule 0  . benzonatate (TESSALON) 100 MG capsule Take 1 capsule (100 mg total) by mouth 3 (three) times daily as needed for cough. (Patient not taking: Reported on 12/17/2018) 30 capsule 1  . DEXILANT 60 MG capsule TAKE 1 CAPSULE BY MOUTH ONCE DAILY (Patient not taking: No sig reported) 90 capsule 0  . Dextromethorphan-guaiFENesin (MUCINEX DM) 30-600 MG TB12 Take 1 tablet by mouth 2 (two) times daily as needed (for  congestion/cough).    . Fesoterodine Fumarate (TOVIAZ PO) Take 1 tablet by mouth daily.    . furosemide (LASIX) 20 MG tablet Take 1 tablet (20 mg total) by mouth daily as needed. (Patient not taking: Reported on 12/17/2018) 30 tablet  0  . ondansetron (ZOFRAN) 8 MG tablet Take 8 mg by mouth every 8 (eight) hours as needed for nausea.    [name] PROMETHAZINE-DM PO Take 1 capsule by mouth 4 (four) times daily as needed for cough.     . solifenacin (VESICARE) 5 MG tablet Take 1 tablet (5 mg total) by mouth daily. (Patient not taking: Reported on 12/07/2018) 30 tablet 3   No current facility-administered medications for this visit.      PHYSICAL EXAMINATION: ECOG PERFORMANCE STATUS: 2 - Symptomatic, <50% confined to bed Vitals:   12/17/18 0940  BP: (!) 115/53  Pulse: (!) 55  Resp: 18  Temp: (!) 96.7 F (35.9 C)  SpO2: 96%   Filed Weights   12/17/18 0940  Weight: 149 lb 8 oz (67.8 kg)    Physical Exam  Constitutional: She is oriented to person, place, and time. No distress.  Frail female, walk in  with walker.  HENT:  Head: Normocephalic and atraumatic.  Nose: Nose normal.  Mouth/Throat: Oropharynx is clear and moist. No oropharyngeal exudate.  Eyes: Pupils are equal, round, and reactive to light. EOM are normal. No scleral icterus.  Neck: Normal range of motion. Neck supple.  Cardiovascular: Normal rate and regular rhythm.  No murmur heard. Pulmonary/Chest: Effort normal. No respiratory distress. She has no rales. She exhibits no tenderness.  Abdominal: Soft. She exhibits no distension. There is no abdominal tenderness.  Musculoskeletal: Normal range of motion.        General: Edema present.     Comments: Bilateral lower extremity 3+ edema, right worse than left.   Neurological: She is alert and oriented to person, place, and time. No cranial nerve deficit. She exhibits normal muscle tone. Coordination normal.  Skin: Skin is warm and dry. She is not diaphoretic. No erythema.   Psychiatric: Affect normal.     CMP Latest Ref Rng & Units 12/17/2018  Glucose 70 - 99 mg/dL 98  BUN 8 - 23 mg/dL 21  Creatinine 0.44 - 1.00 mg/dL 1.14(H)  Sodium 135 - 145 mmol/L 141  Potassium 3.5 - 5.1 mmol/L 4.1  Chloride 98 - 111 mmol/L 105  CO2 22 - 32 mmol/L 29  Calcium 8.9 - 10.3 mg/dL 8.4(L)  Total Protein 6.5 - 8.1 g/dL 6.0(L)  Total Bilirubin 0.3 - 1.2 mg/dL 0.5  Alkaline Phos 38 - 126 U/L 127(H)  AST 15 - 41 U/L 30  ALT 0 - 44 U/L 35   CBC Latest Ref Rng & Units 12/17/2018  WBC 4.0 - 10.5 K/uL 5.3  Hemoglobin 12.0 - 15.0 g/dL 11.0(L)  Hematocrit 36.0 - 46.0 % 34.1(L)  Platelets 150 - 400 K/uL 309    LABORATORY DATA:  I have reviewed the data as listed Lab Results  Component Value Date   WBC 5.3 12/17/2018   HGB 11.0 (L) 12/17/2018   HCT 34.1 (L) 12/17/2018   MCV 99.1 12/17/2018   PLT 309 12/17/2018   Recent Labs    11/18/18 1024 12/07/18 0934 12/10/18 1136 12/17/18 0912  NA 141 145 137 141  K 4.2 4.0 4.5 4.1  CL 106 110 102 105  CO2 _0 GLUCOSE 117* 112* 102* 98  BUN 29* 26* 31* 21  CREATININE 1.23* 1.16* 1.40* 1.14*  CALCIUM 8.3* 8.1* 8.2* 8.4*  GFRNONAA 41* 44* 35* 45*  GFRAA 48* 51* 41* 53*  PROT 6.0* 6.0*  --  6.0*  ALBUMIN 3.2* 3.0*  --  3.3*  AST 33 38  --  30  ALT 37 43  --  35  ALKPHOS 137* 126  --  127*  BILITOT 0.6 0.4  --  0.5   Iron/TIBC/Ferritin/ %Sat No results found for: IRON, TIBC, FERRITIN, IRONPCTSAT   RADIOGRAPHIC STUDIES: I have personally reviewed the radiological images as listed and agreed with the findings in the report.  06/21/2018 PET scan.  1. Left subareolar breast lesion with biopsy clip exhibits mild increased uptake with enlarged and hypermetabolic left axillary lymph node. 2. There is a dominant left upper lobe lung mass which exhibits moderate increased uptake and may represent a focus of metastatic disease from breast primary or primary lung neoplasm. Multiple additional small pulmonary  nodules are scattered throughout both lungs which are worrisome for metastasis. 3. Hypermetabolic bilateral hilar lymph nodes concerning for metastatic disease. 4.  Aortic Atherosclerosis (ICD10-I70.0).  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  10/19/2018 CT chest with contrast Dominant left upper lobe nodule has decreased in size slightly in the interval.  Additional smaller bilateral spiculated pulmonary nodules are stable. Borderline right hilar and left axillary adenopathy.  Stable to minimally decreased in size. Mild T1 11 compression deformity is new. Aortic atherosclerosis CAD.    ASSESSMENT & PLAN:  1. Primary adenocarcinoma of lung, unspecified laterality ([place])   2. Malignant neoplasm of left female breast, unspecified estrogen receptor status, unspecified site of breast ([place])   3. Use of aromatase inhibitors   4. Lower leg edema   5. Osteopenia, unspecified location   6. Encounter for antineoplastic chemotherapy   Cancer Staging Malignant neoplasm of left female breast [facility]) Staging form: Breast, AJCC 8th Edition - Clinical: Stage IB (cT1b, cN1, cM0, G1, ER+, PR+, HER2-) - Unsigned  Primary adenocarcinoma of lung ([place]) Staging form: Lung, AJCC 8th Edition - Clinical stage from 09/28/2018: Stage IVA (cT2, cN3, cM1a) - Signed by Earlie Server, MD on 09/28/2018   Patient has 2 primaries.   Stage IV lung adenocarcinoma, met fusion mutation cT2 N3 M1a PDL 1 TPS more than 70%  CT chest images were independently reviewed and discussed with patient. Dominant left upper lobe nodule and multiple scattered irregular pulmonary parenchymal nodular lesions are stable. Mild basilar pulmonary parenchymal septal thickening is new and can be seen with pulmonary edema. Patient reports no shortness of breath asymptomatic. Continue crizotinib.   #Bilateral lower extremity swelling secondary to crizotinib use. Continue use compression stocking. Recommend a low-salt diet.  Tried Lasix which did not help with her symptoms, but worsened her kidney function. Was taken off Lasix.  #Stage IB continue Arimidex. Obtain diagnostic mammogram.   # Osteopenia, in the context of Arimidex.  Fall risk, history of pelvic fracture. Awaiting dental clearance to start treatments. Continue calcium and vitamin D    All questions were answered. The patient knows to call the clinic with any problems questions or concerns. Return of visit: 4 weeks.  Orders Placed This Encounter  Procedures  . US BREAST LTD UNI LEFT INC AXILLA    Standing Status:   Future    Standing Expiration Date:   02/17/2020    Order Specific Question:   Reason for Exam (SYMPTOM  OR DIAGNOSIS REQUIRED)    Answer:   left breast cancer    Order Specific Question:   Preferred imaging location?    Answer:   [facility]  . MM Digital Diagnostic Unilat L    Standing Status:   Future    Standing Expiration Date:   12/17/2019    Order  Specific Question:   Reason for Exam (SYMPTOM  OR DIAGNOSIS REQUIRED)    Answer:   left breast cancer    Order Specific Question:   Preferred imaging location?    Answer:   [facility]     Earlie Server, MD, PhD 12/17/2018

## 2018-12-17 NOTE — Progress Notes (Signed)
Pt here for follow up. Denies any concerns.  

## 2018-12-20 ENCOUNTER — Other Ambulatory Visit: Payer: Self-pay | Admitting: *Deleted

## 2018-12-20 DIAGNOSIS — C50912 Malignant neoplasm of unspecified site of left female breast: Secondary | ICD-10-CM

## 2018-12-20 DIAGNOSIS — Z79811 Long term (current) use of aromatase inhibitors: Secondary | ICD-10-CM

## 2018-12-29 ENCOUNTER — Other Ambulatory Visit: Payer: Self-pay

## 2018-12-29 ENCOUNTER — Ambulatory Visit
Admission: RE | Admit: 2018-12-29 | Discharge: 2018-12-29 | Disposition: A | Discharge status: Home / Self Care | Payer: Medicare Other | Source: Ambulatory Visit | Attending: Oncology | Admitting: Oncology

## 2018-12-29 DIAGNOSIS — N6322 Unspecified lump in the left breast, upper inner quadrant: Secondary | ICD-10-CM | POA: Diagnosis not present

## 2018-12-29 DIAGNOSIS — Z79811 Long term (current) use of aromatase inhibitors: Secondary | ICD-10-CM | POA: Diagnosis not present

## 2018-12-29 DIAGNOSIS — N6321 Unspecified lump in the left breast, upper outer quadrant: Secondary | ICD-10-CM | POA: Diagnosis not present

## 2018-12-29 DIAGNOSIS — C50912 Malignant neoplasm of unspecified site of left female breast: Secondary | ICD-10-CM | POA: Insufficient documentation

## 2018-12-29 DIAGNOSIS — R590 Localized enlarged lymph nodes: Secondary | ICD-10-CM | POA: Insufficient documentation

## 2018-12-29 DIAGNOSIS — R928 Other abnormal and inconclusive findings on diagnostic imaging of breast: Secondary | ICD-10-CM | POA: Diagnosis not present

## 2018-12-31 ENCOUNTER — Encounter: Payer: Self-pay | Admitting: Family Medicine

## 2019-01-06 ENCOUNTER — Other Ambulatory Visit: Payer: Self-pay | Admitting: Oncology

## 2019-01-06 DIAGNOSIS — C3490 Malignant neoplasm of unspecified part of unspecified bronchus or lung: Secondary | ICD-10-CM

## 2019-01-11 ENCOUNTER — Other Ambulatory Visit: Payer: Self-pay | Admitting: Oncology

## 2019-01-14 ENCOUNTER — Other Ambulatory Visit: Payer: Self-pay

## 2019-01-14 ENCOUNTER — Inpatient Hospital Stay: Payer: Medicare Other | Attending: Oncology

## 2019-01-14 ENCOUNTER — Inpatient Hospital Stay (HOSPITAL_BASED_OUTPATIENT_CLINIC_OR_DEPARTMENT_OTHER): Payer: Medicare Other | Admitting: Oncology

## 2019-01-14 ENCOUNTER — Encounter: Payer: Self-pay | Admitting: Oncology

## 2019-01-14 VITALS — BP 117/59 | HR 56 | Temp 97.1°F | Resp 20 | Ht 63.0 in | Wt 145.7 lb

## 2019-01-14 DIAGNOSIS — T451X5A Adverse effect of antineoplastic and immunosuppressive drugs, initial encounter: Secondary | ICD-10-CM | POA: Diagnosis not present

## 2019-01-14 DIAGNOSIS — D6481 Anemia due to antineoplastic chemotherapy: Secondary | ICD-10-CM | POA: Diagnosis not present

## 2019-01-14 DIAGNOSIS — Z79899 Other long term (current) drug therapy: Secondary | ICD-10-CM | POA: Diagnosis not present

## 2019-01-14 DIAGNOSIS — Z79811 Long term (current) use of aromatase inhibitors: Secondary | ICD-10-CM

## 2019-01-14 DIAGNOSIS — M1990 Unspecified osteoarthritis, unspecified site: Secondary | ICD-10-CM | POA: Diagnosis not present

## 2019-01-14 DIAGNOSIS — C50912 Malignant neoplasm of unspecified site of left female breast: Secondary | ICD-10-CM

## 2019-01-14 DIAGNOSIS — K589 Irritable bowel syndrome without diarrhea: Secondary | ICD-10-CM | POA: Insufficient documentation

## 2019-01-14 DIAGNOSIS — R600 Localized edema: Secondary | ICD-10-CM

## 2019-01-14 DIAGNOSIS — Z803 Family history of malignant neoplasm of breast: Secondary | ICD-10-CM | POA: Diagnosis not present

## 2019-01-14 DIAGNOSIS — Z7189 Other specified counseling: Secondary | ICD-10-CM

## 2019-01-14 DIAGNOSIS — K219 Gastro-esophageal reflux disease without esophagitis: Secondary | ICD-10-CM | POA: Diagnosis not present

## 2019-01-14 DIAGNOSIS — Z807 Family history of other malignant neoplasms of lymphoid, hematopoietic and related tissues: Secondary | ICD-10-CM | POA: Insufficient documentation

## 2019-01-14 DIAGNOSIS — I700 Atherosclerosis of aorta: Secondary | ICD-10-CM | POA: Diagnosis not present

## 2019-01-14 DIAGNOSIS — Z5111 Encounter for antineoplastic chemotherapy: Secondary | ICD-10-CM | POA: Diagnosis not present

## 2019-01-14 DIAGNOSIS — Z833 Family history of diabetes mellitus: Secondary | ICD-10-CM | POA: Diagnosis not present

## 2019-01-14 DIAGNOSIS — C50812 Malignant neoplasm of overlapping sites of left female breast: Secondary | ICD-10-CM | POA: Insufficient documentation

## 2019-01-14 DIAGNOSIS — Z8249 Family history of ischemic heart disease and other diseases of the circulatory system: Secondary | ICD-10-CM | POA: Diagnosis not present

## 2019-01-14 DIAGNOSIS — C3412 Malignant neoplasm of upper lobe, left bronchus or lung: Secondary | ICD-10-CM | POA: Insufficient documentation

## 2019-01-14 DIAGNOSIS — C3490 Malignant neoplasm of unspecified part of unspecified bronchus or lung: Secondary | ICD-10-CM | POA: Diagnosis not present

## 2019-01-14 DIAGNOSIS — Z882 Allergy status to sulfonamides status: Secondary | ICD-10-CM | POA: Insufficient documentation

## 2019-01-14 DIAGNOSIS — Z170 Estrogen receptor positive status [ER+]: Secondary | ICD-10-CM | POA: Insufficient documentation

## 2019-01-14 DIAGNOSIS — R5383 Other fatigue: Secondary | ICD-10-CM | POA: Diagnosis not present

## 2019-01-14 DIAGNOSIS — R7989 Other specified abnormal findings of blood chemistry: Secondary | ICD-10-CM

## 2019-01-14 LAB — COMPREHENSIVE METABOLIC PANEL
ALT: 48 U/L — ABNORMAL HIGH (ref 0–44)
AST: 40 U/L (ref 15–41)
Albumin: 3.2 g/dL — ABNORMAL LOW (ref 3.5–5.0)
Alkaline Phosphatase: 115 U/L (ref 38–126)
Anion gap: 7 (ref 5–15)
BUN: 27 mg/dL — ABNORMAL HIGH (ref 8–23)
CO2: 30 mmol/L (ref 22–32)
Calcium: 8.2 mg/dL — ABNORMAL LOW (ref 8.9–10.3)
Chloride: 105 mmol/L (ref 98–111)
Creatinine, Ser: 1.17 mg/dL — ABNORMAL HIGH (ref 0.44–1.00)
GFR calc Af Amer: 51 mL/min — ABNORMAL LOW (ref >60)
GFR calc non Af Amer: 44 mL/min — ABNORMAL LOW (ref >60)
Glucose, Bld: 110 mg/dL — ABNORMAL HIGH (ref 70–99)
Potassium: 4.4 mmol/L (ref 3.5–5.1)
Sodium: 142 mmol/L (ref 135–145)
Total Bilirubin: 0.5 mg/dL (ref 0.3–1.2)
Total Protein: 6.3 g/dL — ABNORMAL LOW (ref 6.5–8.1)

## 2019-01-14 LAB — CBC WITH DIFFERENTIAL/PLATELET
Abs Immature Granulocytes: 0.05 10*3/uL (ref 0.00–0.07)
Basophils Absolute: 0 10*3/uL (ref 0.0–0.1)
Basophils Relative: 1 %
Eosinophils Absolute: 0.3 10*3/uL (ref 0.0–0.5)
Eosinophils Relative: 6 %
HCT: 33 % — ABNORMAL LOW (ref 36.0–46.0)
Hemoglobin: 10.7 g/dL — ABNORMAL LOW (ref 12.0–15.0)
Immature Granulocytes: 1 %
Lymphocytes Relative: 40 %
Lymphs Abs: 2.1 10*3/uL (ref 0.7–4.0)
MCH: 31.9 pg (ref 26.0–34.0)
MCHC: 32.4 g/dL (ref 30.0–36.0)
MCV: 98.5 fL (ref 80.0–100.0)
Monocytes Absolute: 1 10*3/uL (ref 0.1–1.0)
Monocytes Relative: 18 %
Neutro Abs: 1.8 10*3/uL (ref 1.7–7.7)
Neutrophils Relative %: 34 %
Platelets: 282 10*3/uL (ref 150–400)
RBC: 3.35 MIL/uL — ABNORMAL LOW (ref 3.87–5.11)
RDW: 13.5 % (ref 11.5–15.5)
WBC: 5.3 10*3/uL (ref 4.0–10.5)
nRBC: 0 % (ref 0.0–0.2)

## 2019-01-14 NOTE — Progress Notes (Signed)
Hematology/Oncology follow up note [facility] Telephone:([phone] [phone]   Patient Care Team: [name], MD as PCP - General (Family Medicine) Osborne Oman, MD as Consulting Physician (Obstetrics and Gynecology) Pyrtle, Lajuan Lines, MD as Consulting Physician (Gastroenterology) Telford Nab, RN as Registered Nurse   REASON FOR VISIT:  Follow up for management of lung cancer and breast cancer  HISTORY OF PRESENTING ILLNESS:  Jamie Matthews is a  78 y.o.  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 05/05/2018 bilateral diagnostic breast mammogram showed suspicious mass 1.1cm  in the 12:00 retroareolar region of the left breast and the left axillary lymph node. 06/08/2018 patient status post a left breast retroareolar and left axillary lymph node biopsy. Pathology showed invasive mammary carcinoma, no special type, grade 1, left axillary lymph node positive for invasive mammary carcinoma clinically metastatic.  Background lymph node architecture is not identified. ER> 90% PR> 90%, HER-2 negative.  PET scan done which unfortunately showed additional hypermetabolic bilateral hilar lymph nodes as well as left upper lobe lung mass which may represent a focus of metastatic disease from breast, or primary lung neoplasm.  There is multiple additional small pulmonary nodules are scattered throughout both lungs which are worrisome for metastasis.  Index nodule within the medial right upper lobe measures 7 mm,, peri-broncho-vascular nodule in the right lower lobe measures 1.2 cm.  # Stage IV lung cancer-  Biopsy of lung mass left upper lobe showed non-small cell lung cancer, favor adenocarcinoma.  #NGS came back patient has PD L1 is 70% TPS, MET fusion mutation.  #Mid-March 2020 started on crizotinib  Bilateral lower extremity edema, right >left, Ultrasound venous right 09/20/2018 no DVT.  2D echo  10/10/2018 showed LVEF 55 to 60% Most likely secondary to crizotinib side effects.  # 12/15/2018 CT chest images were independently reviewed and discussed with patient. Dominant left upper lobe nodule and multiple scattered irregular pulmonary parenchymal nodular lesions are stable. Mild basilar pulmonary parenchymal septal thickening is new and can be seen with pulmonary edema. Patient reports no shortness of breath asymptomatic  INTERVAL HISTORY Jamie Matthews is a 78 y.o. female who has above history reviewed by me today presents for follow up visit for evaluation of tolerability of treatment for breast cancer and Stage IV lung cancer. Patient has been on crizotinib, tolerating well except bilateral lower extremity edema. She uses compression stocking. Has tried Lasix which worsened her kidney function without relieving her symptoms.  She is currently off Lasix. Denies any calf tenderness. She has been on Arimidex.  Denies any hot flashes or joint achiness. She had mammogram done during interval for evaluation of treatment response. Denies any shortness of breath or chest pain .[name] Review of Systems  Constitutional: Positive for fatigue. Negative for appetite change, chills and fever.  HENT:   Negative for hearing loss and voice change.   Eyes: Negative for eye problems.  Respiratory: Negative for chest tightness and cough.   Cardiovascular: Positive for leg swelling. Negative for chest pain.  Gastrointestinal: Negative for abdominal distention, abdominal pain and blood in stool.  Endocrine: Negative for hot flashes.  Genitourinary: Negative for difficulty urinating and frequency.   Musculoskeletal: Negative for arthralgias.  Skin: Negative for itching and rash.  Neurological: Negative for extremity weakness.  Hematological: Negative for adenopathy.  Psychiatric/Behavioral: Negative for confusion.    MEDICAL HISTORY:  Past Medical History:  Diagnosis Date  . AKI  (acute kidney  injury) ([place]) 08/17/2018  . Anxiety   . Arthritis   . Belching   . Bladder disorder    OVERACTIVE  . Bowel dysfunction    BLOCKAGE  . Cancer (HCC)    breast  . Constipation   . Depression   . Diverticulitis   . Fibromyalgia   . GERD (gastroesophageal reflux disease)   . Hyperlipidemia   . IBS (irritable bowel syndrome)   . Internal hemorrhoids   . Lung cancer ([place]) 2020  . Memory deficits   . Murmur    asymptomatic  . Pneumonia 11/18/12  . Urinary incontinence   . Vertigo     SURGICAL HISTORY: Past Surgical History:  Procedure Laterality Date  . AXILLARY LYMPH NODE BIOPSY Left 06/08/2018   INVASIVE MAMMARY CARCINOMA  . BLADDER SUSPENSION  2004, 2012  . BREAST BIOPSY Left 06/08/2018   INVASIVE MAMMARY CARCINOMA  . CATARACT EXTRACTION W/PHACO Right 08/27/2015   Procedure: CATARACT EXTRACTION PHACO AND INTRAOCULAR LENS PLACEMENT (IOC);  Surgeon: Estill Cotta, MD;  Location: [facility] ORS;  Service: Ophthalmology;  Laterality: Right;  [place]   1:00.2 AP%  22.5 CDE  23.67 fluid casette lot #[id number]  exp05/31/2018  . CATARACT EXTRACTION W/PHACO Left 10/15/2015   Procedure: CATARACT EXTRACTION PHACO AND INTRAOCULAR LENS PLACEMENT (IOC);  Surgeon: Estill Cotta, MD;  Location: [facility] ORS;  Service: Ophthalmology;  Laterality: Left;  [place] 01:07 AP% 18.1 CDE 21.57 fluid pack lot # [id number]  . CHOLECYSTECTOMY    . COLONOSCOPY  2017  . ELECTROMAGNETIC NAVIGATION BROCHOSCOPY N/A 07/09/2018   Procedure: ELECTROMAGNETIC NAVIGATION BRONCHOSCOPY;  Surgeon: Flora Lipps, MD;  Location: [facility] ORS;  Service: Cardiopulmonary;  Laterality: N/A;  . TONSILLECTOMY  1947    SOCIAL HISTORY: Social History   Socioeconomic History  . Marital status: Married    Spouse name: Bennye Alm  . Number of children: 0  . Years of education: Master's Degree  . Highest education level: Not on file  Occupational History  . Occupation: Retired  Scientific laboratory technician  . Financial resource strain: Not hard  at all  . Food insecurity    Worry: Not on file    Inability: Not on file  . Transportation needs    Medical: Not on file    Non-medical: Not on file  Tobacco Use  . Smoking status: Never Smoker  . Smokeless tobacco: Never Used  Substance and Sexual Activity  . Alcohol use: Yes    Comment: rare wine  . Drug use: No  . Sexual activity: Not Currently  Lifestyle  . Physical activity    Days per week: Not on file    Minutes per session: Not on file  . Stress: Not on file  Relationships  . Social Herbalist on phone: Not on file    Gets together: Not on file    Attends religious service: Not on file    Active member of club or organization: Not on file    Attends meetings of clubs or organizations: Not on file    Relationship status: Not on file  . Intimate partner violence    Fear of current or ex partner: Not on file    Emotionally abused: Not on file    Physically abused: Not on file    Forced sexual activity: Not on file  Other Topics Concern  . Not on file  Social History Narrative   Recently moved with her husband to [place] from [place].   Husband is a retired Pharmacist, community.  No children.   She is a retired Pharmacist, hospital.   Enjoys: Chief Technology Officer, reading - mysteries and biographies, cooking   Exercise: walking, gardening   Diet: low appetite due to cancer treatment, grazing   She is a DNR.    FAMILY HISTORY: Family History  Problem Relation Age of Onset  . Diabetes Father   . Heart disease Father   . Lymphoma Father   . Heart disease Mother   . Breast cancer Sister 69  . Colon cancer Neg Hx   . Esophageal cancer Neg Hx   . Rectal cancer Neg Hx   . Stomach cancer Neg Hx   . Bladder Cancer Neg Hx   . Kidney cancer Neg Hx     ALLERGIES:  is allergic to sulfa antibiotics.  MEDICATIONS:  Current Outpatient Medications  Medication Sig Dispense Refill  . acetaminophen (TYLENOL) 325 MG tablet Take 325 mg by mouth every 6 (six) hours as needed for mild  pain.     [name] anastrozole (ARIMIDEX) 1 MG tablet TAKE ONE TABLET EVERY DAY 30 tablet 3  . atorvastatin (LIPITOR) 20 MG tablet Take 1 tablet (20 mg total) by mouth at bedtime. 90 tablet 3  . buPROPion (WELLBUTRIN XL) 150 MG 24 hr tablet TAKE ONE TABLET BY MOUTH EVERY DAY (Patient taking differently: Take 150 mg by mouth daily. ) 90 tablet 2  . desonide (DESOWEN) 0.05 % lotion Apply 1 application topically as needed (after showering).     . Dextromethorphan-guaiFENesin (MUCINEX DM) 30-600 MG TB12 Take 1 tablet by mouth 2 (two) times daily as needed (for congestion/cough).    . diphenhydrAMINE (BENADRYL) 25 MG tablet Take 25 mg by mouth at bedtime as needed for allergies.     [name] docusate sodium (COLACE) 100 MG capsule Take 1 capsule (100 mg total) by mouth 2 (two) times daily. 60 capsule 0  . Fesoterodine Fumarate (TOVIAZ PO) Take 1 tablet by mouth daily.    . fluticasone (FLONASE) 50 MCG/ACT nasal spray Place 2 sprays into both nostrils daily. 16 g 6  . furosemide (LASIX) 20 MG tablet TAKE ONE TABLET BY MOUTH EVERY DAY AS NEEDED 30 tablet 0  . ketoconazole (NIZORAL) 2 % shampoo Apply 1 application topically. 3 times a week    . memantine (NAMENDA) 10 MG tablet TAKE ONE TABLET BY MOUTH TWICE DAILY (Patient taking differently: Take 10 mg by mouth 2 (two) times daily. ) 180 tablet 2  . Multiple Vitamin (MULTIVITAMIN WITH MINERALS) TABS tablet Take 1 tablet by mouth daily.    [name] MYRBETRIQ 50 MG TB24 tablet TAKE 1 TABLET BY MOUTH DAILY (Patient taking differently: Take 50 mg by mouth daily. ) 90 tablet 1  . omeprazole (PRILOSEC) 40 MG capsule Take 1 capsule (40 mg total) by mouth daily. 30 capsule 1  . ondansetron (ZOFRAN) 8 MG tablet Take 8 mg by mouth every 8 (eight) hours as needed for nausea.    . polyethylene glycol (MIRALAX / GLYCOLAX) packet Take 17 g by mouth daily.    [name] triamcinolone (NASACORT) 55 MCG/ACT AERO nasal inhaler Place 2 sprays into the nose daily as needed (for congestion).     .  venlafaxine XR (EFFEXOR-XR) 150 MG 24 hr capsule TAKE 1 CAPSULE BY MOUTH DAILY (Patient taking differently: Take 150 mg by mouth daily with breakfast. ) 90 capsule 1  . XALKORI 250 MG capsule TAKE 1 CAPSULE (250 MG TOTAL) BY MOUTH 2 TIMES DAILY. 60 capsule 0  . benzonatate (TESSALON) 100 MG capsule  Take 1 capsule (100 mg total) by mouth 3 (three) times daily as needed for cough. (Patient not taking: Reported on 12/17/2018) 30 capsule 1  . DEXILANT 60 MG capsule TAKE 1 CAPSULE BY MOUTH ONCE DAILY (Patient not taking: No sig reported) 90 capsule 0  . lubiprostone (AMITIZA) 24 MCG capsule TAKE ONE CAPSULE TWICE A DAY WITH MEALS (Patient taking differently: Take 24 mcg by mouth 2 (two) times daily. ) 60 capsule 3  . PROMETHAZINE-DM PO Take 1 capsule by mouth 4 (four) times daily as needed for cough.     . solifenacin (VESICARE) 5 MG tablet Take 1 tablet (5 mg total) by mouth daily. (Patient not taking: Reported on 12/07/2018) 30 tablet 3   No current facility-administered medications for this visit.      PHYSICAL EXAMINATION: ECOG PERFORMANCE STATUS: 2 - Symptomatic, <50% confined to bed Vitals:   01/14/19 1005 01/14/19 1012  BP:  (!) 117/59  Pulse:  (!) 56  Resp:  20  Temp: (!) 97.1 F (36.2 C)    Filed Weights   01/14/19 1005  Weight: 145 lb 11.2 oz (66.1 kg)    Physical Exam  Constitutional: She is oriented to person, place, and time. No distress.  Frail female, walk in  with walker.  HENT:  Head: Normocephalic and atraumatic.  Nose: Nose normal.  Mouth/Throat: Oropharynx is clear and moist. No oropharyngeal exudate.  Eyes: Pupils are equal, round, and reactive to light. EOM are normal. No scleral icterus.  Neck: Normal range of motion. Neck supple.  Cardiovascular: Normal rate and regular rhythm.  No murmur heard. Pulmonary/Chest: Effort normal. No respiratory distress. She has no rales. She exhibits no tenderness.  Abdominal: Soft. She exhibits no distension. There is no  abdominal tenderness.  Musculoskeletal: Normal range of motion.        General: Edema present.     Comments: Bilateral lower extremity 3+ edema, right worse than left.   Neurological: She is alert and oriented to person, place, and time. No cranial nerve deficit. She exhibits normal muscle tone. Coordination normal.  Skin: Skin is warm and dry. She is not diaphoretic. No erythema.  Psychiatric: Affect normal.     CMP Latest Ref Rng & Units 01/14/2019  Glucose 70 - 99 mg/dL 110(H)  BUN 8 - 23 mg/dL 27(H)  Creatinine 0.44 - 1.00 mg/dL 1.17(H)  Sodium 135 - 145 mmol/L 142  Potassium 3.5 - 5.1 mmol/L 4.4  Chloride 98 - 111 mmol/L 105  CO2 22 - 32 mmol/L 30  Calcium 8.9 - 10.3 mg/dL 8.2(L)  Total Protein 6.5 - 8.1 g/dL 6.3(L)  Total Bilirubin 0.3 - 1.2 mg/dL 0.5  Alkaline Phos 38 - 126 U/L 115  AST 15 - 41 U/L 40  ALT 0 - 44 U/L 48(H)   CBC Latest Ref Rng & Units 01/14/2019  WBC 4.0 - 10.5 K/uL 5.3  Hemoglobin 12.0 - 15.0 g/dL 10.7(L)  Hematocrit 36.0 - 46.0 % 33.0(L)  Platelets 150 - 400 K/uL 282    LABORATORY DATA:  I have reviewed the data as listed Lab Results  Component Value Date   WBC 5.3 01/14/2019   HGB 10.7 (L) 01/14/2019   HCT 33.0 (L) 01/14/2019   MCV 98.5 01/14/2019   PLT 282 01/14/2019   Recent Labs    12/07/18 0934 12/10/18 1136 12/17/18 0912 01/14/19 0945  NA 145 137 141 142  K 4.0 4.5 4.1 4.4  CL 110 102 105 105  CO2 _0 30  GLUCOSE 112* 102* 98 110*  BUN 26* 31* 21 27*  CREATININE 1.16* 1.40* 1.14* 1.17*  CALCIUM 8.1* 8.2* 8.4* 8.2*  GFRNONAA 44* 35* 45* 44*  GFRAA 51* 41* 53* 51*  PROT 6.0*  --  6.0* 6.3*  ALBUMIN 3.0*  --  3.3* 3.2*  AST 38  --  30 40  ALT 43  --  35 48*  ALKPHOS 126  --  127* 115  BILITOT 0.4  --  0.5 0.5   Iron/TIBC/Ferritin/ %Sat No results found for: IRON, TIBC, FERRITIN, IRONPCTSAT   RADIOGRAPHIC STUDIES: I have personally reviewed the radiological images as listed and agreed with the findings in the report.   06/21/2018 PET scan.  1. Left subareolar breast lesion with biopsy clip exhibits mild increased uptake with enlarged and hypermetabolic left axillary lymph node. 2. There is a dominant left upper lobe lung mass which exhibits moderate increased uptake and may represent a focus of metastatic disease from breast primary or primary lung neoplasm. Multiple additional small pulmonary nodules are scattered throughout both lungs which are worrisome for metastasis. 3. Hypermetabolic bilateral hilar lymph nodes concerning for metastatic disease. 4.  Aortic Atherosclerosis (ICD10-I70.0).  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  10/19/2018 CT chest with contrast Dominant left upper lobe nodule has decreased in size slightly in the interval.  Additional smaller bilateral spiculated pulmonary nodules are stable. Borderline right hilar and left axillary adenopathy.  Stable to minimally decreased in size. Mild T1 11 compression deformity is new. Aortic atherosclerosis CAD.    ASSESSMENT & PLAN:  1. Malignant neoplasm of left female breast, unspecified estrogen receptor status, unspecified site of breast ([place])   2. Primary adenocarcinoma of lung, unspecified laterality ([place])   3. Aromatase inhibitor use   4. Lower leg edema   5. Encounter for antineoplastic chemotherapy   Cancer Staging Malignant neoplasm of left female breast [facility]) Staging form: Breast, AJCC 8th Edition - Clinical stage from 12/17/2018: Stage IB (cT1b, cN1, cM0, G1, ER+, PR+, HER2-) - Signed by Earlie Server, MD on 12/17/2018  Primary adenocarcinoma of lung [facility]) Staging form: Lung, AJCC 8th Edition - Clinical stage from 09/28/2018: Stage IVA (cT2, cN3, cM1a) - Signed by Earlie Server, MD on 09/28/2018   Patient has 2 primaries.   Stage IV lung adenocarcinoma, met fusion mutation cT2 N3 M1a PDL 1 TPS more than 70%  Clinically doing well.  Continue crizotinib. Plan to repeat CT and MRI brain in late Sept.    #Bilateral lower extremity swelling secondary to crizotinib use.  Continue use compression stocking.  Recommend low-salt diet.  She tried Lasix without improvement.  Currently off Lasix.  #Stage IB breast cancer, ER PR positive HER-2 negative.  Unilateral left diagnostic mammogram with ultrasound images were independently reviewed and discussed with patient. Slight interval reduction of the size of the left retroareolar breast mass, 9 x 6 x 7 mm, comparing to 10 x 9 x 8 mm. Axillary lymph node measured 1.3 x 1.0 x 1.0 cm, previously 2.2 x 1.4 x 1.5 cm,  there were 2 other abdominal lymph nodes in the left axilla, which were not reported in previous ultrasound. Discussed with patient that overall, breast cancer responded to endocrine treatments.  Additional 2 lymph nodes need to be closely monitored.  Recommend patient to continue Arimidex.  # Osteopenia, in the context of Arimidex.  Fall risk, history of pelvic fracture. Awaiting dental clearance to start treatments. Continue calcium and vitamin D   #Anemia, secondary to  chemotherapy.  Hemoglobin 10.7.  Continue to monitor.  All questions were answered. The patient knows to call the clinic with any problems questions or concerns. Return of visit: 4 weeks.    Earlie Server, MD, PhD 01/14/2019

## 2019-01-17 MED FILL — XALKORI 250 MG CAPSULE: 250 | 30 days supply | Qty: 60 | Fill #0

## 2019-01-27 ENCOUNTER — Encounter: Payer: Self-pay | Admitting: *Deleted

## 2019-01-27 NOTE — Progress Notes (Signed)
Patient left me a message that she would like a nutrition consult and she was having some discomfort and unable to sleep.  I tried to call her back, but had to leave her a message to return my call.  Message sent to Geraldine Solar, Dr. Collie Siad CMA to see if she has talked with the patient.

## 2019-01-28 NOTE — Progress Notes (Signed)
Wynona Canes will work with me from now on. I am forwarding to her.

## 2019-02-01 ENCOUNTER — Ambulatory Visit (INDEPENDENT_AMBULATORY_CARE_PROVIDER_SITE_OTHER): Payer: Medicare Other

## 2019-02-01 ENCOUNTER — Encounter: Payer: Self-pay | Admitting: *Deleted

## 2019-02-01 DIAGNOSIS — Z23 Encounter for immunization: Secondary | ICD-10-CM

## 2019-02-01 NOTE — Progress Notes (Signed)
Patient called an left message to call her back.  I have left her 2 voicemails to return my call.

## 2019-02-02 ENCOUNTER — Telehealth: Payer: Self-pay

## 2019-02-02 DIAGNOSIS — C50912 Malignant neoplasm of unspecified site of left female breast: Secondary | ICD-10-CM

## 2019-02-02 NOTE — Telephone Encounter (Signed)
-----   Message from Earlie Server, MD sent at 01/28/2019 11:47 AM EDT -----   ----- Message ----- From: Rico Junker, RN Sent: 01/27/2019   3:30 PM EDT To: Vernetta Honey, CMA, Earlie Server, MD

## 2019-02-02 NOTE — Telephone Encounter (Signed)
Nutrition referral entered

## 2019-02-03 ENCOUNTER — Encounter: Payer: Self-pay | Admitting: *Deleted

## 2019-02-03 NOTE — Progress Notes (Signed)
Patient called and states she would like to have a nutrition consult. States she still has reflux and is not sure she is eating right.  Also states Dr. Tasia Catchings said she needed one.  States she either doesn't sleep or sleeps too much.  She thinks she may be depressed.  Offered counseling via televisit and she is agreeable. I will put in a referral for nutritional consult and give patient the number to call Janeann Merl for counseling.

## 2019-02-03 NOTE — Progress Notes (Deleted)
N

## 2019-02-03 NOTE — Progress Notes (Signed)
Number given to patient to call Nathanial Millman the counselor.  Duwayne Heck, CMA had already put in a referral for a nutrition consult.

## 2019-02-03 NOTE — Progress Notes (Signed)
Nutrition referral entered

## 2019-02-10 ENCOUNTER — Other Ambulatory Visit: Payer: Self-pay

## 2019-02-10 NOTE — Progress Notes (Signed)
Patient pre screened for office appointment, no questions or concerns today. 

## 2019-02-11 ENCOUNTER — Inpatient Hospital Stay: Payer: Medicare Other | Attending: Oncology

## 2019-02-11 ENCOUNTER — Inpatient Hospital Stay (HOSPITAL_BASED_OUTPATIENT_CLINIC_OR_DEPARTMENT_OTHER): Payer: Medicare Other | Admitting: Oncology

## 2019-02-11 ENCOUNTER — Other Ambulatory Visit: Payer: Self-pay

## 2019-02-11 VITALS — BP 112/60 | HR 47 | Temp 96.9°F | Resp 16 | Wt 142.0 lb

## 2019-02-11 DIAGNOSIS — R740 Nonspecific elevation of levels of transaminase and lactic acid dehydrogenase [LDH]: Secondary | ICD-10-CM | POA: Insufficient documentation

## 2019-02-11 DIAGNOSIS — K219 Gastro-esophageal reflux disease without esophagitis: Secondary | ICD-10-CM | POA: Insufficient documentation

## 2019-02-11 DIAGNOSIS — Z170 Estrogen receptor positive status [ER+]: Secondary | ICD-10-CM | POA: Diagnosis not present

## 2019-02-11 DIAGNOSIS — Z833 Family history of diabetes mellitus: Secondary | ICD-10-CM | POA: Insufficient documentation

## 2019-02-11 DIAGNOSIS — C3490 Malignant neoplasm of unspecified part of unspecified bronchus or lung: Secondary | ICD-10-CM

## 2019-02-11 DIAGNOSIS — M8580 Other specified disorders of bone density and structure, unspecified site: Secondary | ICD-10-CM | POA: Insufficient documentation

## 2019-02-11 DIAGNOSIS — I700 Atherosclerosis of aorta: Secondary | ICD-10-CM | POA: Insufficient documentation

## 2019-02-11 DIAGNOSIS — Z5111 Encounter for antineoplastic chemotherapy: Secondary | ICD-10-CM

## 2019-02-11 DIAGNOSIS — Z8719 Personal history of other diseases of the digestive system: Secondary | ICD-10-CM | POA: Diagnosis not present

## 2019-02-11 DIAGNOSIS — Z79899 Other long term (current) drug therapy: Secondary | ICD-10-CM | POA: Insufficient documentation

## 2019-02-11 DIAGNOSIS — C50912 Malignant neoplasm of unspecified site of left female breast: Secondary | ICD-10-CM

## 2019-02-11 DIAGNOSIS — D6481 Anemia due to antineoplastic chemotherapy: Secondary | ICD-10-CM | POA: Insufficient documentation

## 2019-02-11 DIAGNOSIS — Z79811 Long term (current) use of aromatase inhibitors: Secondary | ICD-10-CM

## 2019-02-11 DIAGNOSIS — R7989 Other specified abnormal findings of blood chemistry: Secondary | ICD-10-CM

## 2019-02-11 DIAGNOSIS — Z8249 Family history of ischemic heart disease and other diseases of the circulatory system: Secondary | ICD-10-CM | POA: Insufficient documentation

## 2019-02-11 DIAGNOSIS — C3412 Malignant neoplasm of upper lobe, left bronchus or lung: Secondary | ICD-10-CM | POA: Diagnosis not present

## 2019-02-11 DIAGNOSIS — Z83438 Family history of other disorder of lipoprotein metabolism and other lipidemia: Secondary | ICD-10-CM | POA: Insufficient documentation

## 2019-02-11 DIAGNOSIS — R600 Localized edema: Secondary | ICD-10-CM | POA: Insufficient documentation

## 2019-02-11 DIAGNOSIS — Z882 Allergy status to sulfonamides status: Secondary | ICD-10-CM | POA: Insufficient documentation

## 2019-02-11 DIAGNOSIS — T451X5A Adverse effect of antineoplastic and immunosuppressive drugs, initial encounter: Secondary | ICD-10-CM | POA: Diagnosis not present

## 2019-02-11 DIAGNOSIS — K589 Irritable bowel syndrome without diarrhea: Secondary | ICD-10-CM | POA: Diagnosis not present

## 2019-02-11 DIAGNOSIS — Z7189 Other specified counseling: Secondary | ICD-10-CM

## 2019-02-11 DIAGNOSIS — R5383 Other fatigue: Secondary | ICD-10-CM | POA: Insufficient documentation

## 2019-02-11 LAB — CBC WITH DIFFERENTIAL/PLATELET
Abs Immature Granulocytes: 0.07 10*3/uL (ref 0.00–0.07)
Basophils Absolute: 0 10*3/uL (ref 0.0–0.1)
Basophils Relative: 1 %
Eosinophils Absolute: 0.4 10*3/uL (ref 0.0–0.5)
Eosinophils Relative: 4 %
HCT: 34.9 % — ABNORMAL LOW (ref 36.0–46.0)
Hemoglobin: 11.3 g/dL — ABNORMAL LOW (ref 12.0–15.0)
Immature Granulocytes: 1 %
Lymphocytes Relative: 13 %
Lymphs Abs: 1.2 10*3/uL (ref 0.7–4.0)
MCH: 31.8 pg (ref 26.0–34.0)
MCHC: 32.4 g/dL (ref 30.0–36.0)
MCV: 98.3 fL (ref 80.0–100.0)
Monocytes Absolute: 1 10*3/uL (ref 0.1–1.0)
Monocytes Relative: 11 %
Neutro Abs: 6.2 10*3/uL (ref 1.7–7.7)
Neutrophils Relative %: 70 %
Platelets: 295 10*3/uL (ref 150–400)
RBC: 3.55 MIL/uL — ABNORMAL LOW (ref 3.87–5.11)
RDW: 13.8 % (ref 11.5–15.5)
WBC: 8.9 10*3/uL (ref 4.0–10.5)
nRBC: 0 % (ref 0.0–0.2)

## 2019-02-11 LAB — COMPREHENSIVE METABOLIC PANEL
ALT: 59 U/L — ABNORMAL HIGH (ref 0–44)
AST: 45 U/L — ABNORMAL HIGH (ref 15–41)
Albumin: 3.2 g/dL — ABNORMAL LOW (ref 3.5–5.0)
Alkaline Phosphatase: 125 U/L (ref 38–126)
Anion gap: 8 (ref 5–15)
BUN: 26 mg/dL — ABNORMAL HIGH (ref 8–23)
CO2: 32 mmol/L (ref 22–32)
Calcium: 8.2 mg/dL — ABNORMAL LOW (ref 8.9–10.3)
Chloride: 100 mmol/L (ref 98–111)
Creatinine, Ser: 1.31 mg/dL — ABNORMAL HIGH (ref 0.44–1.00)
GFR calc Af Amer: 44 mL/min — ABNORMAL LOW (ref >60)
GFR calc non Af Amer: 38 mL/min — ABNORMAL LOW (ref >60)
Glucose, Bld: 94 mg/dL (ref 70–99)
Potassium: 4.4 mmol/L (ref 3.5–5.1)
Sodium: 140 mmol/L (ref 135–145)
Total Bilirubin: 0.4 mg/dL (ref 0.3–1.2)
Total Protein: 6.1 g/dL — ABNORMAL LOW (ref 6.5–8.1)

## 2019-02-11 MED ORDER — SENNA 8.6 MG PO TABS: 2.0000 | ORAL_TABLET | Freq: Every day | ORAL | 0 refills | Status: DC

## 2019-02-11 MED ORDER — ZOLPIDEM TARTRATE 5 MG PO TABS: 5.0000 mg | ORAL_TABLET | Freq: Every evening | ORAL | 0 refills | Status: DC | PRN

## 2019-02-12 ENCOUNTER — Encounter: Payer: Self-pay | Admitting: Oncology

## 2019-02-12 NOTE — Progress Notes (Signed)
Hematology/Oncology follow up note [facility] [phone] [phone]   Patient Care Team: [name], MD as PCP - General (Family Medicine) Osborne Oman, MD as Consulting Physician (Obstetrics and Gynecology) Pyrtle, Lajuan Lines, MD as Consulting Physician (Gastroenterology) Telford Nab, RN as Registered Nurse   REASON FOR VISIT:  Follow up for management of lung cancer and breast cancer  HISTORY OF PRESENTING ILLNESS:  Jamie Matthews is a  78 y.o.  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 05/05/2018 bilateral diagnostic breast mammogram showed suspicious mass 1.1cm  in the 12:00 retroareolar region of the left breast and the left axillary lymph node. 06/08/2018 patient status post a left breast retroareolar and left axillary lymph node biopsy. Pathology showed invasive mammary carcinoma, no special type, grade 1, left axillary lymph node positive for invasive mammary carcinoma clinically metastatic.  Background lymph node architecture is not identified. ER> 90% PR> 90%, HER-2 negative.  PET scan done which unfortunately showed additional hypermetabolic bilateral hilar lymph nodes as well as left upper lobe lung mass which may represent a focus of metastatic disease from breast, or primary lung neoplasm.  There is multiple additional small pulmonary nodules are scattered throughout both lungs which are worrisome for metastasis.  Index nodule within the medial right upper lobe measures 7 mm,, peri-broncho-vascular nodule in the right lower lobe measures 1.2 cm.  # Stage IV lung cancer-  Biopsy of lung mass left upper lobe showed non-small cell lung cancer, favor adenocarcinoma.  #NGS came back patient has PD L1 is 70% TPS, MET fusion mutation.  #Mid-March 2020 started on crizotinib  Bilateral lower extremity edema, right >left, Ultrasound venous right 09/20/2018 no DVT.  2D echo  10/10/2018 showed LVEF 55 to 60% Most likely secondary to crizotinib side effects.  # 12/15/2018 CT chest images were independently reviewed and discussed with patient. Dominant left upper lobe nodule and multiple scattered irregular pulmonary parenchymal nodular lesions are stable. Mild basilar pulmonary parenchymal septal thickening is new and can be seen with pulmonary edema. Patient reports no shortness of breath asymptomatic  INTERVAL HISTORY Jamie Matthews is a 78 y.o. female who has above history reviewed by me today presents for follow up visit for evaluation of tolerability of treatment for breast cancer and Stage IV lung cancer.  Lung cancer: She takes Crizotinib, tolerating well, except bilateral lower extremity edema,  Edema, stable, she wears compression stocking. Take Lasix daily.   Denies weight loss, fever, chills, fatigue, night sweats.  Breast cancer takes Arimidex.no significant hot flashes or joint aches.  Denies any shortness or breath.  Chronic fatigue, at her baseline.   Review of Systems  Constitutional: Positive for fatigue. Negative for appetite change, chills and fever.  HENT:   Negative for hearing loss and voice change.   Eyes: Negative for eye problems.  Respiratory: Negative for chest tightness and cough.   Cardiovascular: Positive for leg swelling. Negative for chest pain.  Gastrointestinal: Negative for abdominal distention, abdominal pain and blood in stool.  Endocrine: Negative for hot flashes.  Genitourinary: Negative for difficulty urinating and frequency.   Musculoskeletal: Negative for arthralgias.  Skin: Negative for itching and rash.  Neurological: Negative for extremity weakness.  Hematological: Negative for adenopathy.  Psychiatric/Behavioral: Negative for confusion.    MEDICAL HISTORY:  Past Medical History:  Diagnosis Date  . AKI (acute kidney injury) ([place]) 08/17/2018  . Anxiety   . Arthritis   . Belching   .  Bladder  disorder    OVERACTIVE  . Bowel dysfunction    BLOCKAGE  . Cancer (HCC)    breast  . Constipation   . Depression   . Diverticulitis   . Fibromyalgia   . GERD (gastroesophageal reflux disease)   . Hyperlipidemia   . IBS (irritable bowel syndrome)   . Internal hemorrhoids   . Lung cancer ([place]) 2020  . Memory deficits   . Murmur    asymptomatic  . Pneumonia 11/18/12  . Urinary incontinence   . Vertigo     SURGICAL HISTORY: Past Surgical History:  Procedure Laterality Date  . AXILLARY LYMPH NODE BIOPSY Left 06/08/2018   INVASIVE MAMMARY CARCINOMA  . BLADDER SUSPENSION  2004, 2012  . BREAST BIOPSY Left 06/08/2018   INVASIVE MAMMARY CARCINOMA  . CATARACT EXTRACTION W/PHACO Right 08/27/2015   Procedure: CATARACT EXTRACTION PHACO AND INTRAOCULAR LENS PLACEMENT (IOC);  Surgeon: Estill Cotta, MD;  Location: [facility] ORS;  Service: Ophthalmology;  Laterality: Right;  [place]   1:00.2 AP%  22.5 CDE  23.67 fluid casette lot #[id number]  exp05/31/2018  . CATARACT EXTRACTION W/PHACO Left 10/15/2015   Procedure: CATARACT EXTRACTION PHACO AND INTRAOCULAR LENS PLACEMENT (IOC);  Surgeon: Estill Cotta, MD;  Location: [facility] ORS;  Service: Ophthalmology;  Laterality: Left;  [place] 01:07 AP% 18.1 CDE 21.57 fluid pack lot # [id number]  . CHOLECYSTECTOMY    . COLONOSCOPY  2017  . ELECTROMAGNETIC NAVIGATION BROCHOSCOPY N/A 07/09/2018   Procedure: ELECTROMAGNETIC NAVIGATION BRONCHOSCOPY;  Surgeon: Flora Lipps, MD;  Location: [facility] ORS;  Service: Cardiopulmonary;  Laterality: N/A;  . TONSILLECTOMY  1947    SOCIAL HISTORY: Social History   Socioeconomic History  . Marital status: Married    Spouse name: Bennye Alm  . Number of children: 0  . Years of education: Master's Degree  . Highest education level: Not on file  Occupational History  . Occupation: Retired  Scientific laboratory technician  . Financial resource strain: Not hard at all  . Food insecurity    Worry: Not on file    Inability: Not on file  .  Transportation needs    Medical: Not on file    Non-medical: Not on file  Tobacco Use  . Smoking status: Never Smoker  . Smokeless tobacco: Never Used  Substance and Sexual Activity  . Alcohol use: Yes    Comment: rare wine  . Drug use: No  . Sexual activity: Not Currently  Lifestyle  . Physical activity    Days per week: Not on file    Minutes per session: Not on file  . Stress: Not on file  Relationships  . Social Herbalist on phone: Not on file    Gets together: Not on file    Attends religious service: Not on file    Active member of club or organization: Not on file    Attends meetings of clubs or organizations: Not on file    Relationship status: Not on file  . Intimate partner violence    Fear of current or ex partner: Not on file    Emotionally abused: Not on file    Physically abused: Not on file    Forced sexual activity: Not on file  Other Topics Concern  . Not on file  Social History Narrative   Recently moved with her husband to [place] from [place].   Husband is a retired Pharmacist, community.   No children.   She is a retired Pharmacist, hospital.   Enjoys: Chief Technology Officer, reading -  mysteries and biographies, cooking   Exercise: walking, gardening   Diet: low appetite due to cancer treatment, grazing   She is a DNR.    FAMILY HISTORY: Family History  Problem Relation Age of Onset  . Diabetes Father   . Heart disease Father   . Lymphoma Father   . Heart disease Mother   . Breast cancer Sister 9  . Colon cancer Neg Hx   . Esophageal cancer Neg Hx   . Rectal cancer Neg Hx   . Stomach cancer Neg Hx   . Bladder Cancer Neg Hx   . Kidney cancer Neg Hx     ALLERGIES:  is allergic to sulfa antibiotics.  MEDICATIONS:  Current Outpatient Medications  Medication Sig Dispense Refill  . acetaminophen (TYLENOL) 325 MG tablet Take 325 mg by mouth every 6 (six) hours as needed for mild pain.     [name] anastrozole (ARIMIDEX) 1 MG tablet TAKE ONE TABLET EVERY DAY 30  tablet 3  . atorvastatin (LIPITOR) 20 MG tablet Take 1 tablet (20 mg total) by mouth at bedtime. 90 tablet 3  . buPROPion (WELLBUTRIN XL) 150 MG 24 hr tablet TAKE ONE TABLET BY MOUTH EVERY DAY (Patient taking differently: Take 150 mg by mouth daily. ) 90 tablet 2  . desonide (DESOWEN) 0.05 % lotion Apply 1 application topically as needed (after showering).     . DEXILANT 60 MG capsule TAKE 1 CAPSULE BY MOUTH ONCE DAILY 90 capsule 0  . Dextromethorphan-guaiFENesin (MUCINEX DM) 30-600 MG TB12 Take 1 tablet by mouth 2 (two) times daily as needed (for congestion/cough).    . diphenhydrAMINE (BENADRYL) 25 MG tablet Take 25 mg by mouth at bedtime as needed for allergies.     [name] docusate sodium (COLACE) 100 MG capsule Take 1 capsule (100 mg total) by mouth 2 (two) times daily. 60 capsule 0  . Fesoterodine Fumarate (TOVIAZ PO) Take 1 tablet by mouth daily.    . fluticasone (FLONASE) 50 MCG/ACT nasal spray Place 2 sprays into both nostrils daily. 16 g 6  . furosemide (LASIX) 20 MG tablet TAKE ONE TABLET BY MOUTH EVERY DAY AS NEEDED 30 tablet 0  . ketoconazole (NIZORAL) 2 % shampoo Apply 1 application topically. 3 times a week    . lubiprostone (AMITIZA) 24 MCG capsule TAKE ONE CAPSULE TWICE A DAY WITH MEALS (Patient taking differently: Take 24 mcg by mouth 2 (two) times daily. ) 60 capsule 3  . memantine (NAMENDA) 10 MG tablet TAKE ONE TABLET BY MOUTH TWICE DAILY (Patient taking differently: Take 10 mg by mouth 2 (two) times daily. ) 180 tablet 2  . Multiple Vitamin (MULTIVITAMIN WITH MINERALS) TABS tablet Take 1 tablet by mouth daily.    [name] MYRBETRIQ 50 MG TB24 tablet TAKE 1 TABLET BY MOUTH DAILY (Patient taking differently: Take 50 mg by mouth daily. ) 90 tablet 1  . omeprazole (PRILOSEC) 40 MG capsule Take 1 capsule (40 mg total) by mouth daily. 30 capsule 1  . ondansetron (ZOFRAN) 8 MG tablet Take 8 mg by mouth every 8 (eight) hours as needed for nausea.    . polyethylene glycol (MIRALAX / GLYCOLAX)  packet Take 17 g by mouth daily.    . solifenacin (VESICARE) 5 MG tablet Take 1 tablet (5 mg total) by mouth daily. 30 tablet 3  . triamcinolone (NASACORT) 55 MCG/ACT AERO nasal inhaler Place 2 sprays into the nose daily as needed (for congestion).     . venlafaxine XR (EFFEXOR-XR) 150 MG 24  hr capsule TAKE 1 CAPSULE BY MOUTH DAILY (Patient taking differently: Take 150 mg by mouth daily with breakfast. ) 90 capsule 1  . XALKORI 250 MG capsule TAKE 1 CAPSULE (250 MG TOTAL) BY MOUTH 2 TIMES DAILY. 60 capsule 0  . senna (SENOKOT) 8.6 MG TABS tablet Take 2 tablets (17.2 mg total) by mouth daily. Stop Colace 60 tablet 0  . zolpidem (AMBIEN) 5 MG tablet Take 1 tablet (5 mg total) by mouth at bedtime as needed for sleep. 30 tablet 0   No current facility-administered medications for this visit.      PHYSICAL EXAMINATION: ECOG PERFORMANCE STATUS: 2 - Symptomatic, <50% confined to bed Vitals:   02/11/19 1403  BP: 112/60  Pulse: (!) 47  Resp: 16  Temp: (!) 96.9 F (36.1 C)   Filed Weights   02/11/19 1403  Weight: 142 lb (64.4 kg)    Physical Exam  Constitutional: She is oriented to person, place, and time. No distress.  Frail female, walk in  with walker.  HENT:  Head: Normocephalic and atraumatic.  Nose: Nose normal.  Mouth/Throat: Oropharynx is clear and moist. No oropharyngeal exudate.  Eyes: Pupils are equal, round, and reactive to light. EOM are normal. No scleral icterus.  Neck: Normal range of motion. Neck supple.  Cardiovascular: Normal rate and regular rhythm.  No murmur heard. Pulmonary/Chest: Effort normal. No respiratory distress. She has no rales. She exhibits no tenderness.  Abdominal: Soft. She exhibits no distension. There is no abdominal tenderness.  Musculoskeletal: Normal range of motion.        General: Edema present.     Comments: Bilateral lower extremity 3+ edema, right worse than left.   Neurological: She is alert and oriented to person, place, and time. No  cranial nerve deficit. She exhibits normal muscle tone. Coordination normal.  Skin: Skin is warm and dry. She is not diaphoretic. No erythema.  Psychiatric: Affect normal.     CMP Latest Ref Rng & Units 02/11/2019  Glucose 70 - 99 mg/dL 94  BUN 8 - 23 mg/dL 26(H)  Creatinine 0.44 - 1.00 mg/dL 1.31(H)  Sodium 135 - 145 mmol/L 140  Potassium 3.5 - 5.1 mmol/L 4.4  Chloride 98 - 111 mmol/L 100  CO2 22 - 32 mmol/L 32  Calcium 8.9 - 10.3 mg/dL 8.2(L)  Total Protein 6.5 - 8.1 g/dL 6.1(L)  Total Bilirubin 0.3 - 1.2 mg/dL 0.4  Alkaline Phos 38 - 126 U/L 125  AST 15 - 41 U/L 45(H)  ALT 0 - 44 U/L 59(H)   CBC Latest Ref Rng & Units 02/11/2019  WBC 4.0 - 10.5 K/uL 8.9  Hemoglobin 12.0 - 15.0 g/dL 11.3(L)  Hematocrit 36.0 - 46.0 % 34.9(L)  Platelets 150 - 400 K/uL 295    LABORATORY DATA:  I have reviewed the data as listed Lab Results  Component Value Date   WBC 8.9 02/11/2019   HGB 11.3 (L) 02/11/2019   HCT 34.9 (L) 02/11/2019   MCV 98.3 02/11/2019   PLT 295 02/11/2019   Recent Labs    12/17/18 0912 01/14/19 0945 02/11/19 1332  NA 141 142 140  K 4.1 4.4 4.4  CL 105 105 100  CO2 29 30 32  GLUCOSE 98 110* 94  BUN 21 27* 26*  CREATININE 1.14* 1.17* 1.31*  CALCIUM 8.4* 8.2* 8.2*  GFRNONAA 45* 44* 38*  GFRAA 53* 51* 44*  PROT 6.0* 6.3* 6.1*  ALBUMIN 3.3* 3.2* 3.2*  AST 30 40 45*  ALT 35 48*  59*  ALKPHOS 127* 115 125  BILITOT 0.5 0.5 0.4   Iron/TIBC/Ferritin/ %Sat No results found for: IRON, TIBC, FERRITIN, IRONPCTSAT   RADIOGRAPHIC STUDIES: I have personally reviewed the radiological images as listed and agreed with the findings in the report.  06/21/2018 PET scan.  1. Left subareolar breast lesion with biopsy clip exhibits mild increased uptake with enlarged and hypermetabolic left axillary lymph node. 2. There is a dominant left upper lobe lung mass which exhibits moderate increased uptake and may represent a focus of metastatic disease from breast primary or  primary lung neoplasm. Multiple additional small pulmonary nodules are scattered throughout both lungs which are worrisome for metastasis. 3. Hypermetabolic bilateral hilar lymph nodes concerning for metastatic disease. 4.  Aortic Atherosclerosis (ICD10-I70.0).  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  10/19/2018 CT chest with contrast Dominant left upper lobe nodule has decreased in size slightly in the interval.  Additional smaller bilateral spiculated pulmonary nodules are stable. Borderline right hilar and left axillary adenopathy.  Stable to minimally decreased in size. Mild T1 11 compression deformity is new. Aortic atherosclerosis CAD.  # 12/29/2018 Unilateral left diagnostic mammogram with ultrasound images were independently reviewed and discussed with patient. Slight interval reduction of the size of the left retroareolar breast mass, 9 x 6 x 7 mm, comparing to 10 x 9 x 8 mm. Axillary lymph node measured 1.3 x 1.0 x 1.0 cm, previously 2.2 x 1.4 x 1.5 cm,  there were 2 other abdominal lymph nodes in the left axilla, which were not reported in previous ultrasound.  ASSESSMENT & PLAN:  1. Malignant neoplasm of left female breast, unspecified estrogen receptor status, unspecified site of breast ([place])   2. Primary adenocarcinoma of lung, unspecified laterality ([place])   3. Encounter for antineoplastic chemotherapy   4. Aromatase inhibitor use   5. Lower leg edema   6. Osteopenia, unspecified location   Cancer Staging Malignant neoplasm of left female breast [facility]) Staging form: Breast, AJCC 8th Edition - Clinical stage from 12/17/2018: Stage IB (cT1b, cN1, cM0, G1, ER+, PR+, HER2-) - Signed by Earlie Server, MD on 12/17/2018  Primary adenocarcinoma of lung [facility]) Staging form: Lung, AJCC 8th Edition - Clinical stage from 09/28/2018: Stage IVA (cT2, cN3, cM1a) - Signed by Earlie Server, MD on 09/28/2018   Patient has 2 primaries.   Stage IV lung adenocarcinoma, met fusion  mutation cT2 N3 M1a PDL 1 TPS more than 70%  Continue Crizotinib.  Repeat MRI brain and CT chest.    #Bilateral lower extremity swelling secondary to crizotinib use.  Continue compression stocking.  Recommend low-salt diet.  Continue lasix daily PRN edema.   #Stage IB breast cancer, ER PR positive HER-2 negative.  Continue Arimiedex.  Recent mammogram results discussed with her again.  breast cancer responded to endocrine treatments.  Additional 2 lymph nodes need to be closely monitored.  Recommend patient to continue Arimidex.  # Osteopenia, in the context of Arimidex.  Fall risk, history of pelvic fracture. Awaiting dental clearance to start treatments. Continue  calcium and vitamin D   #Anemia, secondary to chemotherapy.  Hemoglobin 11.3.  Monitor.  # Grade 1 transaminitis, continue to monitor.    All questions were answered. The patient knows to call the clinic with any problems questions or concerns. Return of visit: 4 weeks.    Earlie Server, MD, PhD 02/12/2019

## 2019-02-14 ENCOUNTER — Telehealth: Payer: Self-pay

## 2019-02-14 ENCOUNTER — Encounter: Payer: Self-pay | Admitting: *Deleted

## 2019-02-14 NOTE — Telephone Encounter (Signed)
Nutrition  Received notification that patient wanted to change time of nutrition appointment on 9/24.  Spoke with patient and will come see RD at noon on 9/24.      Jamie Matthews B. Zenia Resides, [place], [place] Registered Dietitian [phone] (pager)

## 2019-02-14 NOTE — Progress Notes (Signed)
Patient called with complaints of not being able to sleep because of reflux like symptoms associated with with taking her xalkori.  States she takes it about every 12 hours at around 10-11 p.m.  Dr. Tasia Catchings encouraged her to take 1 over the counter tums tablet once daily and if not getting any better to follow up with her GI doc.  She is agreeable.

## 2019-02-15 ENCOUNTER — Other Ambulatory Visit: Payer: Self-pay | Admitting: Oncology

## 2019-02-16 ENCOUNTER — Other Ambulatory Visit: Payer: Self-pay

## 2019-02-16 ENCOUNTER — Other Ambulatory Visit: Payer: Self-pay | Admitting: Oncology

## 2019-02-16 DIAGNOSIS — C3490 Malignant neoplasm of unspecified part of unspecified bronchus or lung: Secondary | ICD-10-CM

## 2019-02-16 NOTE — Telephone Encounter (Signed)
CBC with Differential/Platelet Order: [id number] Status:  Final result Visible to patient:  No (not released) Next appt:  02/17/2019 at 12:00 PM in Oncology Jennet Maduro, RD) Dx:  Elevated serum creatinine; Primary ad...  Ref Range & Units 5d ago 69mo ago 57mo ago  WBC 4.0 - 10.5 K/uL 8.9  5.3  5.3   RBC 3.87 - 5.11 MIL/uL 3.55Low   3.35Low   3.44Low    Hemoglobin 12.0 - 15.0 g/dL 11.3Low   10.7Low   11.0Low    HCT 36.0 - 46.0 % 34.9Low   33.0Low   34.1Low    MCV 80.0 - 100.0 fL 98.3  98.5  99.1   MCH 26.0 - 34.0 pg 31.8  31.9  32.0   MCHC 30.0 - 36.0 g/dL 32.4  32.4  32.3   RDW 11.5 - 15.5 % 13.8  13.5  14.4   Platelets 150 - 400 K/uL 295  282  309   nRBC 0.0 - 0.2 % 0.0  0.0  0.0   Neutrophils Relative % % 70  34  30   Neutro Abs 1.7 - 7.7 K/uL 6.2  1.8  1.6Low    Lymphocytes Relative % 13  40  38   Lymphs Abs 0.7 - 4.0 K/uL 1.2  2.1  2.0   Monocytes Relative % 11  18  22    Monocytes Absolute 0.1 - 1.0 K/uL 1.0  1.0  1.2High    Eosinophils Relative % 4  6  8    Eosinophils Absolute 0.0 - 0.5 K/uL 0.4  0.3  0.4   Basophils Relative % 1  1  1    Basophils Absolute 0.0 - 0.1 K/uL 0.0  0.0  0.0   Immature Granulocytes % 1  1  1    Abs Immature Granulocytes 0.00 - 0.07 K/uL 0.07  0.05 CM  0.05 CM   Comment: Performed at [facility], [address] [place], [place] [address]  Resulting Agency  [facility] CLIN LAB [facility] CLIN LAB [facility] CL[facility] LAB      Specimen Collected: 02/11/19 13:32 Last Resulted: 02/11/19 14:17     Lab Flowsheet   Order Details   View Encounter   Lab and Collection Details   Routing   Result History     CM=Additional comments      Other Results from 02/11/2019  Contains abnormal data Comprehensive metabolic panel Order: [id number]  Status:  Final result Visible to patient:  No (not released) Next appt:  02/17/2019 at 12:00 PM in Oncology Jennet Maduro, RD) Dx:  Elevated serum creatinine; Primary ad...  Ref Range & Units 5d ago 68mo ago 15mo ago  Sodium 135 -  145 mmol/L 140  142  141   Potassium 3.5 - 5.1 mmol/L 4.4  4.4  4.1   Chloride 98 - 111 mmol/L 100  105  105   CO2 22 - 32 mmol/L 32  30  29   Glucose, Bld 70 - 99 mg/dL 94  110High   98   BUN 8 - 23 mg/dL 26High   27High   21   Creatinine, Ser 0.44 - 1.00 mg/dL 1.31High   1.17High   1.14High    Calcium 8.9 - 10.3 mg/dL 8.2Low   8.2Low   8.4Low    Total Protein 6.5 - 8.1 g/dL 6.1Low   6.3Low   6.0Low    Albumin 3.5 - 5.0 g/dL 3.2Low   3.2Low   3.3Low    AST 15 - 41 U/L 45High  40  30   ALT 0 - 44 U/L 59High   48High   35   Alkaline Phosphatase 38 - 126 U/L 125  115  127High    Total Bilirubin 0.3 - 1.2 mg/dL 0.4  0.5  0.5   GFR calc non Af Amer >60 mL/min 38Low   44Low   45Low    GFR calc Af Amer >60 mL/min 44Low   51Low   53Low    Anion gap 5 - 15 8  7  CM  7 CM   Comment: Performed at [facility], [address] [place], [place] [address]  Resulting Agency  [facility] CLIN LAB [facility] CLIN LAB [facility] CLIN LAB      Specimen Collected: 02/11/19 13:32 Last Resulted: 02/11/19 14:12

## 2019-02-17 ENCOUNTER — Inpatient Hospital Stay: Payer: Medicare Other

## 2019-02-17 NOTE — Progress Notes (Signed)
Nutrition  Message received that patient called and was not going to be able to make the 12:00 nutrition appointment today in person and requested phone call.  Called patient at 12:01 and husband answered and said that she was laying down and this was not a good time.  RD provided contact number to husband for patient to call back and set up another appointment.    Maicey Barrientez B. Zenia Resides, [place], [place] Registered Dietitian [phone] (pager)

## 2019-02-24 ENCOUNTER — Ambulatory Visit: Payer: Self-pay | Admitting: Family Medicine

## 2019-02-24 ENCOUNTER — Ambulatory Visit (INDEPENDENT_AMBULATORY_CARE_PROVIDER_SITE_OTHER): Payer: Medicare Other | Admitting: Family Medicine

## 2019-02-24 ENCOUNTER — Other Ambulatory Visit: Payer: Self-pay

## 2019-02-24 ENCOUNTER — Encounter: Payer: Self-pay | Admitting: Family Medicine

## 2019-02-24 VITALS — Ht 63.0 in

## 2019-02-24 DIAGNOSIS — R109 Unspecified abdominal pain: Secondary | ICD-10-CM

## 2019-02-24 MED FILL — XALKORI 250 MG CAPSULE: 250 | 30 days supply | Qty: 60 | Fill #0

## 2019-02-24 NOTE — Progress Notes (Signed)
No show

## 2019-03-08 ENCOUNTER — Ambulatory Visit: Admission: RE | Admit: 2019-03-08 | Payer: Medicare Other | Source: Ambulatory Visit

## 2019-03-08 ENCOUNTER — Ambulatory Visit: Payer: Medicare Other

## 2019-03-08 ENCOUNTER — Telehealth: Payer: Self-pay | Admitting: *Deleted

## 2019-03-08 ENCOUNTER — Telehealth: Payer: Self-pay

## 2019-03-08 NOTE — Telephone Encounter (Signed)
Jamie Matthews are you able to review? I am not sure if we have all of this information. She was a new patient to [place] earlier this year

## 2019-03-08 NOTE — Telephone Encounter (Signed)
It was on my voice mail from after 1:00 today. She said she knows her initial stage, but needs the current stage

## 2019-03-08 NOTE — Telephone Encounter (Signed)
Patient stated that she is needing to know the dosage of Zantac she was taking and the frequency .  She stated she is also needing to know what her stage of cance rwas  at the time she was diagnosis and what her stage is now   She stated that she is needing this on paper  Patient will come by to sign paperwork giving [place] permission to release information to office   Fax- [phone]

## 2019-03-08 NOTE — Telephone Encounter (Signed)
Based off of her records she was taking Zantac 150 mg 1-2 times daily. Most Zantac has been pulled of of the shelves due to potential contamination of cancer causing agents. I recommend Pepcid 20 mg daily if she's looking for an alternative.  Dr. Tasia Catchings, her oncologist has information regarding staging of her breast cancer in her last office visit note dated 02/11/19. Perhaps it would be best if she contacted their office for the requested letter or office notes.

## 2019-03-08 NOTE — Telephone Encounter (Signed)
I spoke to patient around 12:15.  Does she need another call regarding this info?

## 2019-03-08 NOTE — Telephone Encounter (Signed)
Left v/m requesting pt to cb.

## 2019-03-08 NOTE — Telephone Encounter (Signed)
[place] Night - Client Nonclinical Telephone Record AccessNurse Client [facility] Night - Client Client Site [place] Physician Waunita Schooner- MD Contact Type Call Who Is Calling Patient / Member / Family / Caregiver Caller Name Luverne Farone [facility] Caller Phone Number [phone] Patient Name Jinger [facility] Patient DOB 26-May-1941 Call Type Message Only Information Provided Reason for Call Request for General Office Information Initial Comment Caller states she needs to speak to someone about her medication records. Additional Comment Office hours provided. Call Closed By: Christain [place] Transaction Date/Time: 03/07/2019 5:01:26 PM (ET)

## 2019-03-08 NOTE — Telephone Encounter (Signed)
Returned call to patient.

## 2019-03-08 NOTE — Telephone Encounter (Signed)
Patient called asking what her current stage of cancer is for a zantac form she is filing

## 2019-03-09 NOTE — Telephone Encounter (Signed)
Patient advised as below. Patient will check with oncologist in regards to staging of her cancer questions and will let us know if there is anything we can help with.

## 2019-03-10 ENCOUNTER — Ambulatory Visit (INDEPENDENT_AMBULATORY_CARE_PROVIDER_SITE_OTHER): Payer: Medicare Other | Admitting: Family Medicine

## 2019-03-10 ENCOUNTER — Encounter: Payer: Self-pay | Admitting: Family Medicine

## 2019-03-10 VITALS — Ht 63.0 in

## 2019-03-10 DIAGNOSIS — R05 Cough: Secondary | ICD-10-CM

## 2019-03-10 DIAGNOSIS — K5909 Other constipation: Secondary | ICD-10-CM | POA: Diagnosis not present

## 2019-03-10 DIAGNOSIS — C3490 Malignant neoplasm of unspecified part of unspecified bronchus or lung: Secondary | ICD-10-CM

## 2019-03-10 DIAGNOSIS — K219 Gastro-esophageal reflux disease without esophagitis: Secondary | ICD-10-CM | POA: Diagnosis not present

## 2019-03-10 DIAGNOSIS — R053 Chronic cough: Secondary | ICD-10-CM

## 2019-03-10 NOTE — Patient Instructions (Addendum)
Stop omeprazole. Start Pepcid AC twice daily. Continue dexilant. Start Zyrtec at bedtime for allergies.  Do not use benadryl give it can worsen constipation.  Talk with oncologist about side effects of Xalkori. Use amitiza twice daily,  Use miralax and senekot as needed.  Increase water and fiber in diet (metamucil) If shortness of breath  or cough not improving.. have cough evaluated in person.   Food Choices for Gastroesophageal Reflux Disease, Adult When you have gastroesophageal reflux disease (GERD), the foods you eat and your eating habits are very important. Choosing the right foods can help ease your discomfort. Think about working with a nutrition specialist (dietitian) to help you make good choices. What are tips for following this plan?  Meals  Choose healthy foods that are low in fat, such as fruits, vegetables, whole grains, low-fat dairy products, and lean meat, fish, and poultry.  Eat small meals often instead of 3 large meals a day. Eat your meals slowly, and in a place where you are relaxed. Avoid bending over or lying down until 2-3 hours after eating.  Avoid eating meals 2-3 hours before bed.  Avoid drinking a lot of liquid with meals.  Cook foods using methods other than frying. Bake, grill, or broil food instead.  Avoid or limit: ? Chocolate. ? Peppermint or spearmint. ? Alcohol. ? Pepper. ? Black and decaffeinated coffee. ? Black and decaffeinated tea. ? Bubbly (carbonated) soft drinks. ? Caffeinated energy drinks and soft drinks.  Limit high-fat foods such as: ? Fatty meat or fried foods. ? Whole milk, cream, butter, or ice cream. ? Nuts and nut butters. ? Pastries, donuts, and sweets made with butter or shortening.  Avoid foods that cause symptoms. These foods may be different for everyone. Common foods that cause symptoms include: ? Tomatoes. ? Oranges, lemons, and limes. ? Peppers. ? Spicy food. ? Onions and  garlic. ? Vinegar. Lifestyle  Maintain a healthy weight. Ask your doctor what weight is healthy for you. If you need to lose weight, work with your doctor to do so safely.  Exercise for at least 30 minutes for 5 or more days each week, or as told by your doctor.  Wear loose-fitting clothes.  Do not smoke. If you need help quitting, ask your doctor.  Sleep with the head of your bed higher than your feet. Use a wedge under the mattress or blocks under the bed frame to raise the head of the bed. Summary  When you have gastroesophageal reflux disease (GERD), food and lifestyle choices are very important in easing your symptoms.  Eat small meals often instead of 3 large meals a day. Eat your meals slowly, and in a place where you are relaxed.  Limit high-fat foods such as fatty meat or fried foods.  Avoid bending over or lying down until 2-3 hours after eating.  Avoid peppermint and spearmint, caffeine, alcohol, and chocolate. This information is not intended to replace advice given to you by your health care provider. Make sure you discuss any questions you have with your health care provider. Document Released: 11/11/2011 Document Revised: 09/02/2018 Document Reviewed: 06/17/2016 Elsevier Patient Education  [place].    High-Fiber Diet Fiber, also called dietary fiber, is a type of carbohydrate that is found in fruits, vegetables, whole grains, and beans. A high-fiber diet can have many health benefits. Your health care provider may recommend a high-fiber diet to help:  Prevent constipation. Fiber can make your bowel movements more regular.  Lower your  cholesterol.  Relieve the following conditions: ? Swelling of veins in the anus (hemorrhoids). ? Swelling and irritation (inflammation) of specific areas of the digestive tract (uncomplicated diverticulosis). ? A problem of the large intestine (colon) that sometimes causes pain and diarrhea (irritable bowel syndrome,  IBS).  Prevent overeating as part of a weight-loss plan.  Prevent heart disease, type 2 diabetes, and certain cancers. What is my plan? The recommended daily fiber intake in grams (g) includes:  38 g for men age 74 or younger.  30 g for men over age 74.  23 g for women age 48 or younger.  21 g for women over age 61. You can get the recommended daily intake of dietary fiber by:  Eating a variety of fruits, vegetables, grains, and beans.  Taking a fiber supplement, if it is not possible to get enough fiber through your diet. What do I need to know about a high-fiber diet?  It is better to get fiber through food sources rather than from fiber supplements. There is not a lot of research about how effective supplements are.  Always check the fiber content on the nutrition facts label of any prepackaged food. Look for foods that contain 5 g of fiber or more per serving.  Talk with a diet and nutrition specialist (dietitian) if you have questions about specific foods that are recommended or not recommended for your medical condition, especially if those foods are not listed below.  Gradually increase how much fiber you consume. If you increase your intake of dietary fiber too quickly, you may have bloating, cramping, or gas.  Drink plenty of water. Water helps you to digest fiber. What are tips for following this plan?  Eat a wide variety of high-fiber foods.  Make sure that half of the grains that you eat each day are whole grains.  Eat breads and cereals that are made with whole-grain flour instead of refined flour or white flour.  Eat brown rice, bulgur wheat, or millet instead of white rice.  Start the day with a breakfast that is high in fiber, such as a cereal that contains 5 g of fiber or more per serving.  Use beans in place of meat in soups, salads, and pasta dishes.  Eat high-fiber snacks, such as berries, raw vegetables, nuts, and popcorn.  Choose whole fruits and  vegetables instead of processed forms like juice or sauce. What foods can I eat?  Fruits Berries. Pears. Apples. Oranges. Avocado. Prunes and raisins. Dried figs. Vegetables Sweet potatoes. Spinach. Kale. Artichokes. Cabbage. Broccoli. Cauliflower. Green peas. Carrots. Squash. Grains Whole-grain breads. Multigrain cereal. Oats and oatmeal. Brown rice. Barley. Bulgur wheat. [place]bo. Quinoa. Bran muffins. Popcorn. Rye wafer crackers. Meats and other proteins Navy, kidney, and pinto beans. Soybeans. Split peas. Lentils. Nuts and seeds. Dairy Fiber-fortified yogurt. Beverages Fiber-fortified soy milk. Fiber-fortified orange juice. Other foods Fiber bars. The items listed above may not be a complete list of recommended foods and beverages. Contact a dietitian for more options. What foods are not recommended? Fruits Fruit juice. Cooked, strained fruit. Vegetables Fried potatoes. Canned vegetables. Well-cooked vegetables. Grains White bread. Pasta made with refined flour. White rice. Meats and other proteins Fatty cuts of meat. Fried chicken or fried fish. Dairy Milk. Yogurt. Cream cheese. Sour cream. Fats and oils Butters. Beverages Soft drinks. Other foods Cakes and pastries. The items listed above may not be a complete list of foods and beverages to avoid. Contact a dietitian for more information. Summary  Fiber  is a type of carbohydrate. It is found in fruits, vegetables, whole grains, and beans.  There are many health benefits of eating a high-fiber diet, such as preventing constipation, lowering blood cholesterol, helping with weight loss, and reducing your risk of heart disease, diabetes, and certain cancers.  Gradually increase your intake of fiber. Increasing too fast can result in cramping, bloating, and gas. Drink plenty of water while you increase your fiber.  The best sources of fiber include whole fruits and vegetables, whole grains, nuts, seeds, and beans. This  information is not intended to replace advice given to you by your health care provider. Make sure you discuss any questions you have with your health care provider. Document Released: 05/12/2005 Document Revised: 03/16/2017 Document Reviewed: 03/16/2017 Elsevier Patient Education  2020 [employer].

## 2019-03-10 NOTE — Assessment & Plan Note (Addendum)
Poor control.. may be contributing to cough.  May be worsened as SE of Xalkori.  Stop omeprazole... continue dexilant. Add pepcid AC BID.  Trigger avoidance, raise bed head.  Diet info mailed.

## 2019-03-10 NOTE — Assessment & Plan Note (Signed)
In part due to lung cancer, but given recent worsening and daily GERD and some allergy symptoms... these may be worsening.  No clear sign of infection, no SOB.  Will try to treat GERD as well as start zyrtec at night for allergies.

## 2019-03-10 NOTE — Assessment & Plan Note (Signed)
Stay off all meds contributing as able.. no benadryol and vesicare.  Jamie Matthews has as SE as well but needs to be on this 1-2 years.  Not using amitiza regularly.. change to daily BID amitiza and use miralax daily /prn as well as senekot prn.  Increase water and  Fiber. Diet info mailed.

## 2019-03-10 NOTE — Progress Notes (Signed)
AVS mailed to patient as instructed by Dr. Diona Browner.

## 2019-03-10 NOTE — Progress Notes (Signed)
VIRTUAL VISIT Due to national recommendations of social distancing due to [place] 19, a virtual visit is felt to be most appropriate for this patient at this time.   I connected with the patient on 03/10/19 at 10:00 AM EDT by virtual telehealth platform and verified that I am speaking with the correct person using two identifiers.   Interactive audio and video telecommunications were attempted between this provider and patient, however failed, due to patient having technical difficulties OR patient did not have access to video capability.  We continued and completed visit with audio only.   I discussed the limitations, risks, security and privacy concerns of performing an evaluation and management service by  virtual telehealth platform and the availability of in person appointments. I also discussed with the patient that there may be a patient responsible charge related to this service. The patient expressed understanding and agreed to proceed.  Patient location: Home Provider Location: [facility] Participants: Amy Diona Browner and [name]   Chief Complaint  Patient presents with  . Cough    with thick mucus-currently taking oral cancer treatment for breast & lung cancer  . Gastroesophageal Reflux  . Constipation    History of Present Illness:  78 year old female patient of Dr. Verda Cumins currently being treated for NSCL Ca with Xalkori x 3/2020as well as on Arimidex for past breast cancer, presents with several new issues.  1.cough, ongoing x 1-2 months. Productive with thick white occ yellow.  No headache, no ear pain, sinus pressure, no ST.  She has some sneezing and itchy eyes. Has mucus in back of throat.  No SOB, no wheeze.  No fever.  2.GERD: Noted daily heartburn pain in chest after meals despite Dexilant.  Was taking with zantac in past .. now off.  Also taking omeprazole.  3.Chronic constipation: on miralax, senekot and amitiza.. BM every few  days.  Not talking vesicare, benadryl    COVID 19 screen No recent travel or known exposure to [place] The patient denies respiratory symptoms of COVID 19 at this time.  The importance of social distancing was discussed today.   Review of Systems  Constitutional: Negative for chills and fever.  HENT: Negative for congestion, ear discharge, ear pain and sore throat.   Eyes: Negative for pain and redness.  Respiratory: Positive for cough and sputum production. Negative for shortness of breath and wheezing.   Cardiovascular: Negative for chest pain, palpitations and leg swelling.  Gastrointestinal: Positive for heartburn. Negative for abdominal pain, blood in stool, constipation, diarrhea, nausea and vomiting.  Genitourinary: Negative for dysuria.  Musculoskeletal: Negative for falls and myalgias.  Skin: Negative for rash.  Neurological: Negative for dizziness.  Psychiatric/Behavioral: Negative for depression. The patient is not nervous/anxious.       Past Medical History:  Diagnosis Date  . AKI (acute kidney injury) ([facility]) 08/17/2018  . Anxiety   . Arthritis   . Belching   . Bladder disorder    OVERACTIVE  . Bowel dysfunction    BLOCKAGE  . Cancer ([facility])    breast  . Constipation   . Depression   . Diverticulitis   . Fibromyalgia   . GERD (gastroesophageal reflux disease)   . Hyperlipidemia   . IBS (irritable bowel syndrome)   . Internal hemorrhoids   . Lung cancer ([facility]) 2020  . Memory deficits   . Murmur    asymptomatic  . Pneumonia 11/18/12  . Urinary incontinence   . Vertigo  reports that she has never smoked. She has never used smokeless tobacco. She reports current alcohol use. She reports that she does not use drugs.   Current Outpatient Medications:  .  acetaminophen (TYLENOL) 325 MG tablet, Take 325 mg by mouth every 6 (six) hours as needed for mild pain. , Disp: , Rfl:  .  anastrozole (ARIMIDEX) 1 MG tablet, TAKE ONE TABLET EVERY DAY, Disp: 30 tablet,  Rfl: 3 .  atorvastatin (LIPITOR) 20 MG tablet, Take 1 tablet (20 mg total) by mouth at bedtime., Disp: 90 tablet, Rfl: 3 .  benzonatate (TESSALON) 100 MG capsule, , Disp: , Rfl:  .  buPROPion (WELLBUTRIN XL) 150 MG 24 hr tablet, TAKE ONE TABLET BY MOUTH EVERY DAY, Disp: 90 tablet, Rfl: 2 .  desonide (DESOWEN) 0.05 % lotion, Apply 1 application topically as needed (after showering). , Disp: , Rfl:  .  DEXILANT 60 MG capsule, TAKE 1 CAPSULE BY MOUTH ONCE DAILY, Disp: 90 capsule, Rfl: 0 .  Dextromethorphan-guaiFENesin (MUCINEX DM) 30-600 MG TB12, Take 1 tablet by mouth 2 (two) times daily as needed (for congestion/cough)., Disp: , Rfl:  .  diphenhydrAMINE (BENADRYL) 25 MG tablet, Take 25 mg by mouth at bedtime as needed for allergies. , Disp: , Rfl:  .  docusate sodium (COLACE) 100 MG capsule, Take 1 capsule (100 mg total) by mouth 2 (two) times daily., Disp: 60 capsule, Rfl: 0 .  Fesoterodine Fumarate (TOVIAZ PO), Take 1 tablet by mouth daily., Disp: , Rfl:  .  fluticasone (FLONASE) 50 MCG/ACT nasal spray, Place 2 sprays into both nostrils daily., Disp: 16 g, Rfl: 6 .  furosemide (LASIX) 20 MG tablet, TAKE ONE TABLET DAILY AS NEEDED, Disp: 30 tablet, Rfl: 0 .  ketoconazole (NIZORAL) 2 % shampoo, Apply 1 application topically. 3 times a week, Disp: , Rfl:  .  lubiprostone (AMITIZA) 24 MCG capsule, TAKE ONE CAPSULE TWICE A DAY WITH MEALS, Disp: 60 capsule, Rfl: 3 .  memantine (NAMENDA) 10 MG tablet, TAKE ONE TABLET BY MOUTH TWICE DAILY, Disp: 180 tablet, Rfl: 2 .  Multiple Vitamin (MULTIVITAMIN WITH MINERALS) TABS tablet, Take 1 tablet by mouth daily., Disp: , Rfl:  .  MYRBETRIQ 50 MG TB24 tablet, TAKE 1 TABLET BY MOUTH DAILY, Disp: 90 tablet, Rfl: 1 .  omeprazole (PRILOSEC) 40 MG capsule, Take 1 capsule (40 mg total) by mouth daily., Disp: 30 capsule, Rfl: 1 .  ondansetron (ZOFRAN) 8 MG tablet, Take 8 mg by mouth every 8 (eight) hours as needed for nausea., Disp: , Rfl:  .  polyethylene glycol  (MIRALAX / GLYCOLAX) packet, Take 17 g by mouth daily., Disp: , Rfl:  .  senna (SENOKOT) 8.6 MG TABS tablet, Take 2 tablets (17.2 mg total) by mouth daily. Stop Colace, Disp: 60 tablet, Rfl: 0 .  solifenacin (VESICARE) 5 MG tablet, Take 1 tablet (5 mg total) by mouth daily., Disp: 30 tablet, Rfl: 3 .  triamcinolone (NASACORT) 55 MCG/ACT AERO nasal inhaler, Place 2 sprays into the nose daily as needed (for congestion). , Disp: , Rfl:  .  venlafaxine XR (EFFEXOR-XR) 150 MG 24 hr capsule, TAKE 1 CAPSULE BY MOUTH DAILY, Disp: 90 capsule, Rfl: 1 .  XALKORI 250 MG capsule, TAKE 1 CAPSULE (250 MG TOTAL) BY MOUTH 2 TIMES DAILY., Disp: 60 capsule, Rfl: 2 .  zolpidem (AMBIEN) 5 MG tablet, Take 1 tablet (5 mg total) by mouth at bedtime as needed for sleep., Disp: 30 tablet, Rfl: 0   Observations/Objective: Height 5\' 3"  (  1.6 m).  Physical Exam  Physical Exam Constitutional:      General: The patient is not in acute distress. Pulmonary:     Effort: Pulmonary effort is normal. No respiratory distress.  Neurological:     Mental Status: The patient is alert and oriented to person, place, and time.  Psychiatric:        Mood and Affect: Mood normal.        Behavior: Behavior normal.   Assessment and Plan Chronic cough In part due to lung cancer, but given recent worsening and daily GERD and some allergy symptoms... these may be worsening.  No clear sign of infection, no SOB.  Will try to treat GERD as well as start zyrtec at night for allergies.  Gastroesophageal reflux disease Poor control.. may be contributing to cough.  May be worsened as SE of Xalkori.  Stop omeprazole... continue dexilant. Add pepcid AC BID.  Trigger avoidance, raise bed head.  Diet info mailed.   Chronic constipation Stay off all meds contributing as able.. no benadryol and vesicare.  Hulda Humphrey has as SE as well but needs to be on this 1-2 years.  Not using amitiza regularly.. change to daily BID amitiza and use miralax  daily /prn as well as senekot prn.  Increase water and  Fiber. Diet info mailed.     I discussed the assessment and treatment plan with the patient. The patient was provided an opportunity to ask questions and all were answered. The patient agreed with the plan and demonstrated an understanding of the instructions.   The patient was advised to call back or seek an in-person evaluation if the symptoms worsen or if the condition fails to improve as anticipated.   Total visit time 30 minutes, > 50% spent counseling and cordinating patients care.   Eliezer Lofts, MD

## 2019-03-11 ENCOUNTER — Inpatient Hospital Stay: Payer: Medicare Other

## 2019-03-11 ENCOUNTER — Inpatient Hospital Stay: Payer: Medicare Other | Admitting: Oncology

## 2019-03-14 NOTE — Progress Notes (Signed)
Called patient no answer left message  

## 2019-03-15 ENCOUNTER — Other Ambulatory Visit: Payer: Self-pay

## 2019-03-15 ENCOUNTER — Inpatient Hospital Stay (HOSPITAL_BASED_OUTPATIENT_CLINIC_OR_DEPARTMENT_OTHER): Payer: Medicare Other | Admitting: Oncology

## 2019-03-15 ENCOUNTER — Encounter: Payer: Self-pay | Admitting: Oncology

## 2019-03-15 ENCOUNTER — Inpatient Hospital Stay: Payer: Medicare Other | Attending: Oncology

## 2019-03-15 VITALS — BP 124/67 | HR 52 | Temp 97.8°F | Wt 141.8 lb

## 2019-03-15 DIAGNOSIS — Z882 Allergy status to sulfonamides status: Secondary | ICD-10-CM | POA: Insufficient documentation

## 2019-03-15 DIAGNOSIS — T451X5A Adverse effect of antineoplastic and immunosuppressive drugs, initial encounter: Secondary | ICD-10-CM | POA: Diagnosis not present

## 2019-03-15 DIAGNOSIS — D6481 Anemia due to antineoplastic chemotherapy: Secondary | ICD-10-CM | POA: Insufficient documentation

## 2019-03-15 DIAGNOSIS — K589 Irritable bowel syndrome without diarrhea: Secondary | ICD-10-CM | POA: Insufficient documentation

## 2019-03-15 DIAGNOSIS — C3490 Malignant neoplasm of unspecified part of unspecified bronchus or lung: Secondary | ICD-10-CM

## 2019-03-15 DIAGNOSIS — C50812 Malignant neoplasm of overlapping sites of left female breast: Secondary | ICD-10-CM | POA: Insufficient documentation

## 2019-03-15 DIAGNOSIS — Z8719 Personal history of other diseases of the digestive system: Secondary | ICD-10-CM | POA: Diagnosis not present

## 2019-03-15 DIAGNOSIS — Z79811 Long term (current) use of aromatase inhibitors: Secondary | ICD-10-CM | POA: Insufficient documentation

## 2019-03-15 DIAGNOSIS — K219 Gastro-esophageal reflux disease without esophagitis: Secondary | ICD-10-CM | POA: Diagnosis not present

## 2019-03-15 DIAGNOSIS — M8580 Other specified disorders of bone density and structure, unspecified site: Secondary | ICD-10-CM | POA: Insufficient documentation

## 2019-03-15 DIAGNOSIS — R600 Localized edema: Secondary | ICD-10-CM | POA: Insufficient documentation

## 2019-03-15 DIAGNOSIS — Z5111 Encounter for antineoplastic chemotherapy: Secondary | ICD-10-CM | POA: Diagnosis not present

## 2019-03-15 DIAGNOSIS — C773 Secondary and unspecified malignant neoplasm of axilla and upper limb lymph nodes: Secondary | ICD-10-CM | POA: Diagnosis not present

## 2019-03-15 DIAGNOSIS — Z170 Estrogen receptor positive status [ER+]: Secondary | ICD-10-CM | POA: Insufficient documentation

## 2019-03-15 DIAGNOSIS — M1990 Unspecified osteoarthritis, unspecified site: Secondary | ICD-10-CM | POA: Insufficient documentation

## 2019-03-15 DIAGNOSIS — Z803 Family history of malignant neoplasm of breast: Secondary | ICD-10-CM | POA: Diagnosis not present

## 2019-03-15 DIAGNOSIS — C50912 Malignant neoplasm of unspecified site of left female breast: Secondary | ICD-10-CM

## 2019-03-15 DIAGNOSIS — Z833 Family history of diabetes mellitus: Secondary | ICD-10-CM | POA: Insufficient documentation

## 2019-03-15 DIAGNOSIS — R5383 Other fatigue: Secondary | ICD-10-CM | POA: Diagnosis not present

## 2019-03-15 DIAGNOSIS — Z8249 Family history of ischemic heart disease and other diseases of the circulatory system: Secondary | ICD-10-CM | POA: Diagnosis not present

## 2019-03-15 DIAGNOSIS — R7989 Other specified abnormal findings of blood chemistry: Secondary | ICD-10-CM

## 2019-03-15 DIAGNOSIS — Z79899 Other long term (current) drug therapy: Secondary | ICD-10-CM | POA: Insufficient documentation

## 2019-03-15 DIAGNOSIS — Z7189 Other specified counseling: Secondary | ICD-10-CM

## 2019-03-15 DIAGNOSIS — Z808 Family history of malignant neoplasm of other organs or systems: Secondary | ICD-10-CM | POA: Diagnosis not present

## 2019-03-15 DIAGNOSIS — C3412 Malignant neoplasm of upper lobe, left bronchus or lung: Secondary | ICD-10-CM | POA: Insufficient documentation

## 2019-03-15 LAB — COMPREHENSIVE METABOLIC PANEL
ALT: 33 U/L (ref 0–44)
AST: 30 U/L (ref 15–41)
Albumin: 3.4 g/dL — ABNORMAL LOW (ref 3.5–5.0)
Alkaline Phosphatase: 112 U/L (ref 38–126)
Anion gap: 8 (ref 5–15)
BUN: 26 mg/dL — ABNORMAL HIGH (ref 8–23)
CO2: 29 mmol/L (ref 22–32)
Calcium: 7.9 mg/dL — ABNORMAL LOW (ref 8.9–10.3)
Chloride: 102 mmol/L (ref 98–111)
Creatinine, Ser: 1.34 mg/dL — ABNORMAL HIGH (ref 0.44–1.00)
GFR calc Af Amer: 43 mL/min — ABNORMAL LOW (ref >60)
GFR calc non Af Amer: 37 mL/min — ABNORMAL LOW (ref >60)
Glucose, Bld: 94 mg/dL (ref 70–99)
Potassium: 3.9 mmol/L (ref 3.5–5.1)
Sodium: 139 mmol/L (ref 135–145)
Total Bilirubin: 0.6 mg/dL (ref 0.3–1.2)
Total Protein: 6.5 g/dL (ref 6.5–8.1)

## 2019-03-15 LAB — CBC WITH DIFFERENTIAL/PLATELET
Abs Immature Granulocytes: 0.07 10*3/uL (ref 0.00–0.07)
Basophils Absolute: 0.1 10*3/uL (ref 0.0–0.1)
Basophils Relative: 1 %
Eosinophils Absolute: 1.1 10*3/uL — ABNORMAL HIGH (ref 0.0–0.5)
Eosinophils Relative: 16 %
HCT: 34.6 % — ABNORMAL LOW (ref 36.0–46.0)
Hemoglobin: 11.1 g/dL — ABNORMAL LOW (ref 12.0–15.0)
Immature Granulocytes: 1 %
Lymphocytes Relative: 22 %
Lymphs Abs: 1.5 10*3/uL (ref 0.7–4.0)
MCH: 32.2 pg (ref 26.0–34.0)
MCHC: 32.1 g/dL (ref 30.0–36.0)
MCV: 100.3 fL — ABNORMAL HIGH (ref 80.0–100.0)
Monocytes Absolute: 1.2 10*3/uL — ABNORMAL HIGH (ref 0.1–1.0)
Monocytes Relative: 17 %
Neutro Abs: 3.1 10*3/uL (ref 1.7–7.7)
Neutrophils Relative %: 43 %
Platelets: 278 10*3/uL (ref 150–400)
RBC: 3.45 MIL/uL — ABNORMAL LOW (ref 3.87–5.11)
RDW: 14 % (ref 11.5–15.5)
WBC: 7.1 10*3/uL (ref 4.0–10.5)
nRBC: 0 % (ref 0.0–0.2)

## 2019-03-15 MED ORDER — SUCRALFATE 1 G PO TABS: 1.0000 g | ORAL_TABLET | Freq: Two times a day (BID) | ORAL | 0 refills | Status: DC

## 2019-03-15 NOTE — Progress Notes (Signed)
Patient reports missing PM doses of Xalkori due to heartburn.

## 2019-03-15 NOTE — Progress Notes (Signed)
Hematology/Oncology follow up note [facility] Telephone:([phone] Fax:([phone]) [phone]   Patient Care Team: [name], MD as PCP - General (Family Medicine) Osborne Oman, MD as Consulting Physician (Obstetrics and Gynecology) Pyrtle, Lajuan Lines, MD as Consulting Physician (Gastroenterology) Telford Nab, RN as Registered Nurse   REASON FOR VISIT:  Follow up for management of lung cancer and breast cancer  HISTORY OF PRESENTING ILLNESS:  Jamie Matthews is a  78 y.o.  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 05/05/2018 bilateral diagnostic breast mammogram showed suspicious mass 1.1cm  in the 12:00 retroareolar region of the left breast and the left axillary lymph node. 06/08/2018 patient status post a left breast retroareolar and left axillary lymph node biopsy. Pathology showed invasive mammary carcinoma, no special type, grade 1, left axillary lymph node positive for invasive mammary carcinoma clinically metastatic.  Background lymph node architecture is not identified. ER> 90% PR> 90%, HER-2 negative.  PET scan done which unfortunately showed additional hypermetabolic bilateral hilar lymph nodes as well as left upper lobe lung mass which may represent a focus of metastatic disease from breast, or primary lung neoplasm.  There is multiple additional small pulmonary nodules are scattered throughout both lungs which are worrisome for metastasis.  Index nodule within the medial right upper lobe measures 7 mm,, peri-broncho-vascular nodule in the right lower lobe measures 1.2 cm.  # Stage IV lung cancer-  Biopsy of lung mass left upper lobe showed non-small cell lung cancer, favor adenocarcinoma.  #NGS came back patient has PD L1 is 70% TPS, MET fusion mutation.  #Mid-March 2020 started on crizotinib  Bilateral lower extremity edema, right >left, Ultrasound venous right 09/20/2018 no DVT.  2D echo  10/10/2018 showed LVEF 55 to 60% Most likely secondary to crizotinib side effects.  # 12/15/2018 CT chest images were independently reviewed and discussed with patient. Dominant left upper lobe nodule and multiple scattered irregular pulmonary parenchymal nodular lesions are stable. Mild basilar pulmonary parenchymal septal thickening is new and can be seen with pulmonary edema. Patient reports no shortness of breath asymptomatic  #12/29/2018 unilateral left diagnostic mammogram with ultrasound images were independently reviewed and discussed with patient. Slight interval reduction of the size of the left retroareolar breast mass, 9 x 6 x 7 mm, comparing to 10 x 9 x 8 mm. Axillary lymph node measured 1.3 x 1.0 x 1.0 cm, previously 2.2 x 1.4 x 1.5 cm,  there were 2 other abdominal lymph nodes in the left axilla, which were not reported in previous ultrasound. Discussed with patient that overall, breast cancer responded to endocrine treatments.  Additional 2 lymph nodes need to be closely monitored.  Recommend patient to continue Arimidex.   INTERVAL HISTORY Jamie Matthews is a 78 y.o. female who has above history reviewed by me today presents for follow up visit for evaluation of tolerability of treatment for breast cancer and Stage IV lung cancer. Patient has been on crizotinib, tolerating well except bilateral lower extremity edema. She also noticed that worsening of heartburn.  She is currently on Dexilant.  She was advised by primary care provider to try Pepcid as well which she has not started.  Recently because of heartburn, she has been skipping evening dose of crizotinib.  Bilateral lower extremity edema, secondary to crizotinib.  Last visit, she reports that she was not taking Lasix.  Today she reports she takes Lasix 20 mg daily.  She does  not feel dramatic improvement of lower extremity edema.  She uses compression stocking.  Breast cancer, takes Arimidex.  Tolerating well.   Denies any hot flashes or joint aches  .[name] Review of Systems  Constitutional: Positive for fatigue. Negative for appetite change, chills and fever.  HENT:   Negative for hearing loss and voice change.   Eyes: Negative for eye problems.  Respiratory: Negative for chest tightness and cough.   Cardiovascular: Positive for leg swelling. Negative for chest pain.  Gastrointestinal: Negative for abdominal distention, abdominal pain and blood in stool.  Endocrine: Negative for hot flashes.  Genitourinary: Negative for difficulty urinating and frequency.   Musculoskeletal: Negative for arthralgias.  Skin: Negative for itching and rash.  Neurological: Negative for extremity weakness.  Hematological: Negative for adenopathy.  Psychiatric/Behavioral: Negative for confusion.    MEDICAL HISTORY:  Past Medical History:  Diagnosis Date   AKI (acute kidney injury) ([place]) 08/17/2018   Anxiety    Arthritis    Belching    Bladder disorder    OVERACTIVE   Bowel dysfunction    BLOCKAGE   Cancer (HCC)    breast   Constipation    Depression    Diverticulitis    Fibromyalgia    GERD (gastroesophageal reflux disease)    Hyperlipidemia    IBS (irritable bowel syndrome)    Internal hemorrhoids    Lung cancer ([place]) 2020   Memory deficits    Murmur    asymptomatic   Pneumonia 11/18/12   Urinary incontinence    Vertigo     SURGICAL HISTORY: Past Surgical History:  Procedure Laterality Date   AXILLARY LYMPH NODE BIOPSY Left 06/08/2018   INVASIVE MAMMARY CARCINOMA   BLADDER SUSPENSION  2004, 2012   BREAST BIOPSY Left 06/08/2018   INVASIVE MAMMARY CARCINOMA   CATARACT EXTRACTION W/PHACO Right 08/27/2015   Procedure: CATARACT EXTRACTION PHACO AND INTRAOCULAR LENS PLACEMENT ([place]);  Surgeon: Estill Cotta, MD;  Location: [facility] ORS;  Service: Ophthalmology;  Laterality: Right;  [place]   1:00.2 AP%  22.5 CDE  23.67 fluid casette lot #[id number]  exp05/31/2018   CATARACT  EXTRACTION W/PHACO Left 10/15/2015   Procedure: CATARACT EXTRACTION PHACO AND INTRAOCULAR LENS PLACEMENT ([facility]);  Surgeon: Estill Cotta, MD;  Location: [facility] ORS;  Service: Ophthalmology;  Laterality: Left;  [place] 01:07 AP% 18.1 CDE 21.57 fluid pack lot # [id number]   CHOLECYSTECTOMY     COLONOSCOPY  2017   ELECTROMAGNETIC NAVIGATION BROCHOSCOPY N/A 07/09/2018   Procedure: ELECTROMAGNETIC NAVIGATION BRONCHOSCOPY;  Surgeon: Flora Lipps, MD;  Location: [facility] ORS;  Service: Cardiopulmonary;  Laterality: N/A;   TONSILLECTOMY  1947    SOCIAL HISTORY: Social History   Socioeconomic History   Marital status: Married    Spouse name: Scientist, forensic   Number of children: 0   Years of education: Master's Degree   Highest education level: Not on file  Occupational History   Occupation: Retired  Scientist, product/process development strain: Not hard at [employer]    Worry: Not on file    Inability: Not on Lexicographer needs    Medical: Not on file    Non-medical: Not on file  Tobacco Use   Smoking status: Never Smoker   Smokeless tobacco: Never Used  Substance and Sexual Activity   Alcohol use: Yes    Comment: rare wine   Drug use: No   Sexual activity: Not Currently  Lifestyle   Physical activity    Days per week: Not  on file    Minutes per session: Not on file   Stress: Not on file  Relationships   Social connections    Talks on phone: Not on file    Gets together: Not on file    Attends religious service: Not on file    Active member of club or organization: Not on file    Attends meetings of clubs or organizations: Not on file    Relationship status: Not on file   Intimate partner violence    Fear of current or ex partner: Not on file    Emotionally abused: Not on file    Physically abused: Not on file    Forced sexual activity: Not on file  Other Topics Concern   Not on file  Social History Narrative   Recently moved with her husband to  [place] from [place].   Husband is a retired Pharmacist, community.   No children.   She is a retired Pharmacist, hospital.   Enjoys: Chief Technology Officer, reading - mysteries and biographies, cooking   Exercise: walking, gardening   Diet: low appetite due to cancer treatment, grazing   She is a DNR.    FAMILY HISTORY: Family History  Problem Relation Age of Onset   Diabetes Father    Heart disease Father    Lymphoma Father    Heart disease Mother    Breast cancer Sister 52   Colon cancer Neg Hx    Esophageal cancer Neg Hx    Rectal cancer Neg Hx    Stomach cancer Neg Hx    Bladder Cancer Neg Hx    Kidney cancer Neg Hx     ALLERGIES:  is allergic to sulfa antibiotics.  MEDICATIONS:  Current Outpatient Medications  Medication Sig Dispense Refill   acetaminophen (TYLENOL) 325 MG tablet Take 325 mg by mouth every 6 (six) hours as needed for mild pain.      anastrozole (ARIMIDEX) 1 MG tablet TAKE ONE TABLET EVERY DAY 30 tablet 3   atorvastatin (LIPITOR) 20 MG tablet Take 1 tablet (20 mg total) by mouth at bedtime. 90 tablet 3   benzonatate (TESSALON) 100 MG capsule      buPROPion (WELLBUTRIN XL) 150 MG 24 hr tablet TAKE ONE TABLET BY MOUTH EVERY DAY 90 tablet 2   desonide (DESOWEN) 0.05 % lotion Apply 1 application topically as needed (after showering).      DEXILANT 60 MG capsule TAKE 1 CAPSULE BY MOUTH ONCE DAILY 90 capsule 0   Dextromethorphan-guaiFENesin (MUCINEX DM) 30-600 MG TB12 Take 1 tablet by mouth 2 (two) times daily as needed (for congestion/cough).     docusate sodium (COLACE) 100 MG capsule Take 1 capsule (100 mg total) by mouth 2 (two) times daily. 60 capsule 0   Fesoterodine Fumarate (TOVIAZ PO) Take 1 tablet by mouth daily.     fluticasone (FLONASE) 50 MCG/ACT nasal spray Place 2 sprays into both nostrils daily. 16 g 6   furosemide (LASIX) 20 MG tablet TAKE ONE TABLET DAILY AS NEEDED 30 tablet 0   ketoconazole (NIZORAL) 2 % shampoo Apply 1 application topically. 3  times a week     lubiprostone (AMITIZA) 24 MCG capsule TAKE ONE CAPSULE TWICE A DAY WITH MEALS 60 capsule 3   memantine (NAMENDA) 10 MG tablet TAKE ONE TABLET BY MOUTH TWICE DAILY 180 tablet 2   Multiple Vitamin (MULTIVITAMIN WITH MINERALS) TABS tablet Take 1 tablet by mouth daily.     MYRBETRIQ 50 MG TB24 tablet TAKE 1 TABLET  BY MOUTH DAILY 90 tablet 1   omeprazole (PRILOSEC) 40 MG capsule Take 1 capsule (40 mg total) by mouth daily. 30 capsule 1   ondansetron (ZOFRAN) 8 MG tablet Take 8 mg by mouth every 8 (eight) hours as needed for nausea.     polyethylene glycol (MIRALAX / GLYCOLAX) packet Take 17 g by mouth daily.     senna (SENOKOT) 8.6 MG TABS tablet Take 2 tablets (17.2 mg total) by mouth daily. Stop Colace 60 tablet 0   triamcinolone (NASACORT) 55 MCG/ACT AERO nasal inhaler Place 2 sprays into the nose daily as needed (for congestion).      venlafaxine XR (EFFEXOR-XR) 150 MG 24 hr capsule TAKE 1 CAPSULE BY MOUTH DAILY 90 capsule 1   XALKORI 250 MG capsule TAKE 1 CAPSULE (250 MG TOTAL) BY MOUTH 2 TIMES DAILY. 60 capsule 2   zolpidem (AMBIEN) 5 MG tablet Take 1 tablet (5 mg total) by mouth at bedtime as needed for sleep. 30 tablet 0   No current facility-administered medications for this visit.      PHYSICAL EXAMINATION: ECOG PERFORMANCE STATUS: 2 - Symptomatic, <50% confined to bed Vitals:   03/15/19 1341  BP: 124/67  Pulse: (!) 52  Temp: 97.8 F (36.6 C)   Filed Weights   03/15/19 1341  Weight: 141 lb 12.8 oz (64.3 kg)    Physical Exam  Constitutional: She is oriented to person, place, and time. No distress.  Frail female, walk in  with walker.  HENT:  Head: Normocephalic and atraumatic.  Nose: Nose normal.  Mouth/Throat: Oropharynx is clear and moist. No oropharyngeal exudate.  Eyes: Pupils are equal, round, and reactive to light. EOM are normal. No scleral icterus.  Neck: Normal range of motion. Neck supple.  Cardiovascular: Normal rate and regular  rhythm.  No murmur heard. Pulmonary/Chest: Effort normal. No respiratory distress. She has no rales. She exhibits no tenderness.  Abdominal: Soft. She exhibits no distension. There is no abdominal tenderness.  Musculoskeletal: Normal range of motion.        General: Edema present.     Comments: Bilateral lower extremity 3+ edema, right worse than left.   Neurological: She is alert and oriented to person, place, and time. No cranial nerve deficit. She exhibits normal muscle tone. Coordination normal.  Skin: Skin is warm and dry. She is not diaphoretic. No erythema.  Psychiatric: Affect normal.     CMP Latest Ref Rng & Units 02/11/2019  Glucose 70 - 99 mg/dL 94  BUN 8 - 23 mg/dL 26(H)  Creatinine 0.44 - 1.00 mg/dL 1.31(H)  Sodium 135 - 145 mmol/L 140  Potassium 3.5 - 5.1 mmol/L 4.4  Chloride 98 - 111 mmol/L 100  CO2 22 - 32 mmol/L 32  Calcium 8.9 - 10.3 mg/dL 8.2(L)  Total Protein 6.5 - 8.1 g/dL 6.1(L)  Total Bilirubin 0.3 - 1.2 mg/dL 0.4  Alkaline Phos 38 - 126 U/L 125  AST 15 - 41 U/L 45(H)  ALT 0 - 44 U/L 59(H)   CBC Latest Ref Rng & Units 03/15/2019  WBC 4.0 - 10.5 K/uL 7.1  Hemoglobin 12.0 - 15.0 g/dL 11.1(L)  Hematocrit 36.0 - 46.0 % 34.6(L)  Platelets 150 - 400 K/uL 278    LABORATORY DATA:  I have reviewed the data as listed Lab Results  Component Value Date   WBC 7.1 03/15/2019   HGB 11.1 (L) 03/15/2019   HCT 34.6 (L) 03/15/2019   MCV 100.3 (H) 03/15/2019   PLT 278 03/15/2019  Recent Labs    12/17/18 0912 01/14/19 0945 02/11/19 1332  NA 141 142 140  K 4.1 4.4 4.4  CL 105 105 100  CO2 29 30 32  GLUCOSE 98 110* 94  BUN 21 27* 26*  CREATININE 1.14* 1.17* 1.31*  CALCIUM 8.4* 8.2* 8.2*  GFRNONAA 45* 44* 38*  GFRAA 53* 51* 44*  PROT 6.0* 6.3* 6.1*  ALBUMIN 3.3* 3.2* 3.2*  AST 30 40 45*  ALT 35 48* 59*  ALKPHOS 127* 115 125  BILITOT 0.5 0.5 0.4   Iron/TIBC/Ferritin/ %Sat No results found for: IRON, TIBC, FERRITIN, IRONPCTSAT   RADIOGRAPHIC  STUDIES: I have personally reviewed the radiological images as listed and agreed with the findings in the report. US Breast Ltd Uni Left Inc Axilla  Result Date: 12/29/2018 CLINICAL DATA:  Biopsy proven invasive mammary carcinoma in the left breast and metastatic axillary adenopathy. Patient is being treated with Arimidex. EXAM: DIGITAL DIAGNOSTIC LEFT MAMMOGRAM WITH CAD AND TOMO ULTRASOUND LEFT BREAST COMPARISON:  Previous exam(s). ACR Breast Density Category b: There are scattered areas of fibroglandular density. FINDINGS: There is a coil shaped clip in the subareolar region of the left breast. Mammographically the subareolar mass appears smaller than the prior exam dated 05/05/2018. No new suspicious mass or malignant type microcalcifications identified. Mammographic images were processed with CAD. Targeted ultrasound is performed, showing a hypoechoic mass in the 12 o'clock retroareolar region of the left breast measuring 9 x 6 x 7 mm. On the prior ultrasound dated 05/05/2018 it measured 10 x 9 x 8 mm. Sonographic evaluation of the left axilla shows an enlarged lymph node measuring 1.3 x 1.0 x 1.0 cm. Previously it measured 2.2 x 1.4 x 1.5 cm. Directly adjacent to this is a lymph node with a thickened cortex measuring 6 x 4 x 6 mm. It has a hyperechoic focus in its thought to be the biopsy clip. There is a third lymph node measuring 10 x 5 x 9 mm also with a thickened cortex. IMPRESSION: Slight interval reduction in the size of the mass in the retroareolar region of the left breast. There are 3 abnormal lymph nodes in the left axilla. The largest lymph node is smaller than the prior exam. RECOMMENDATION: Continued treatment planning of the known left breast cancer and axillary metastasis is recommended. I have discussed the findings and recommendations with the patient. Results were also provided in writing at the conclusion of the visit. If applicable, a reminder letter will be sent to the patient regarding  the next appointment. BI-RADS CATEGORY  6: Known biopsy-proven malignancy. Electronically Signed   By: Lillia Mountain M.D.   On: 12/29/2018 12:01   Mm Diag Breast Tomo Uni Left  Result Date: 12/29/2018 CLINICAL DATA:  Biopsy proven invasive mammary carcinoma in the left breast and metastatic axillary adenopathy. Patient is being treated with Arimidex. EXAM: DIGITAL DIAGNOSTIC LEFT MAMMOGRAM WITH CAD AND TOMO ULTRASOUND LEFT BREAST COMPARISON:  Previous exam(s). ACR Breast Density Category b: There are scattered areas of fibroglandular density. FINDINGS: There is a coil shaped clip in the subareolar region of the left breast. Mammographically the subareolar mass appears smaller than the prior exam dated 05/05/2018. No new suspicious mass or malignant type microcalcifications identified. Mammographic images were processed with CAD. Targeted ultrasound is performed, showing a hypoechoic mass in the 12 o'clock retroareolar region of the left breast measuring 9 x 6 x 7 mm. On the prior ultrasound dated 05/05/2018 it measured 10 x 9 x 8 mm. Sonographic  evaluation of the left axilla shows an enlarged lymph node measuring 1.3 x 1.0 x 1.0 cm. Previously it measured 2.2 x 1.4 x 1.5 cm. Directly adjacent to this is a lymph node with a thickened cortex measuring 6 x 4 x 6 mm. It has a hyperechoic focus in its thought to be the biopsy clip. There is a third lymph node measuring 10 x 5 x 9 mm also with a thickened cortex. IMPRESSION: Slight interval reduction in the size of the mass in the retroareolar region of the left breast. There are 3 abnormal lymph nodes in the left axilla. The largest lymph node is smaller than the prior exam. RECOMMENDATION: Continued treatment planning of the known left breast cancer and axillary metastasis is recommended. I have discussed the findings and recommendations with the patient. Results were also provided in writing at the conclusion of the visit. If applicable, a reminder letter will be sent  to the patient regarding the next appointment. BI-RADS CATEGORY  6: Known biopsy-proven malignancy. Electronically Signed   By: Lillia Mountain M.D.   On: 12/29/2018 12:01      ASSESSMENT & PLAN:  1. Primary adenocarcinoma of lung, unspecified laterality ([place])   2. Malignant neoplasm of left female breast, unspecified estrogen receptor status, unspecified site of breast ([place])   3. Lower leg edema   4. Encounter for antineoplastic chemotherapy   5. Aromatase inhibitor use   6. Use of aromatase inhibitors   7. Goals of care, counseling/discussion   8. Osteopenia, unspecified location   Cancer Staging Malignant neoplasm of left female breast [facility]) Staging form: Breast, AJCC 8th Edition - Clinical stage from 12/17/2018: Stage IB (cT1b, cN1, cM0, G1, ER+, PR+, HER2-) - Signed by Earlie Server, MD on 12/17/2018  Primary adenocarcinoma of lung [facility]) Staging form: Lung, AJCC 8th Edition - Clinical stage from 09/28/2018: Stage IVA (cT2, cN3, cM1a) - Signed by Earlie Server, MD on 09/28/2018   Patient has 2 primaries.   Stage IV lung adenocarcinoma, met fusion mutation cT2 N3 M1a PDL 1 TPS more than 70%  Patient is clinically doing well.  No breathing difficulties.  Tolerating crizotinib with mild to moderate difficulties. Patient had CT and MRI brain scheduled in late September and patient was not able to make it.  Scan is now scheduled at end of October. I advised patient to be compliant with crizotinib, not to skip doses.  GERD can be worsened by crizotinib.  See below.  #GERD, continue Dexilant.  She has not tried Pepcid advised by primary care provider. I also recommend patient that she can try sucralfate 1 g twice daily to see if this will help her GERD symptoms.  If no improvement, she can also take Tums    #Bilateral lower extremity swelling secondary to crizotinib use.  Continue use compression stocking.  Recommend low-salt diet.  Per patient she is now taking Lasix.  Potassium levels are stable.   Continue  #Stage IB breast cancer, ER PR positive HER-2 negative.  Continue Arimidex.  Tolerating well. #Osteopenia, in the context of chronic Arimidex use.  Fall risk.  History of pelvic fracture. Awaiting dental clearance to start bisphosphonate treatments. Advised patient to continue calcium and vitamin D supplementation.  #Anemia, secondary to chemotherapy.  Hemoglobin 11 continue to monitor. #Goals of care, patient asked about her life expectancy and I spent time discussing with her about her prognosis, and future plans of cancer treatments if she progresses on current treatment.  She appreciate explanation.  She  understands that her condition is not curable. She lives in [place] with her husband whose medical  condition has declined.  She reports that both of them have end-of-life plan.[name]  CODE STATUS is DNR/DNI. Recommend patient to establish care with palliative care service.  All questions were answered. The patient knows to call the clinic with any problems questions or concerns. Return of visit: 4 weeks.    Earlie Server, MD, PhD 03/15/2019

## 2019-03-19 ENCOUNTER — Other Ambulatory Visit: Payer: Self-pay | Admitting: Internal Medicine

## 2019-03-21 ENCOUNTER — Other Ambulatory Visit: Payer: Self-pay | Admitting: Oncology

## 2019-03-22 ENCOUNTER — Other Ambulatory Visit: Payer: Self-pay | Admitting: Oncology

## 2019-03-23 ENCOUNTER — Ambulatory Visit
Admission: RE | Admit: 2019-03-23 | Discharge: 2019-03-23 | Disposition: A | Discharge status: Home / Self Care | Payer: Medicare Other | Source: Ambulatory Visit | Attending: Oncology | Admitting: Oncology

## 2019-03-23 ENCOUNTER — Other Ambulatory Visit: Payer: Self-pay

## 2019-03-23 DIAGNOSIS — C50912 Malignant neoplasm of unspecified site of left female breast: Secondary | ICD-10-CM | POA: Diagnosis not present

## 2019-03-23 DIAGNOSIS — C3490 Malignant neoplasm of unspecified part of unspecified bronchus or lung: Secondary | ICD-10-CM | POA: Diagnosis not present

## 2019-03-23 DIAGNOSIS — C50919 Malignant neoplasm of unspecified site of unspecified female breast: Secondary | ICD-10-CM | POA: Diagnosis not present

## 2019-03-23 MED ORDER — GADOBUTROL 1 MMOL/ML IV SOLN
6.0000 mL | Freq: Once | INTRAVENOUS | Status: AC | PRN
  Administered 2019-03-23: 6 mL via INTRAVENOUS

## 2019-03-28 ENCOUNTER — Other Ambulatory Visit: Payer: Self-pay | Admitting: Internal Medicine

## 2019-03-28 ENCOUNTER — Other Ambulatory Visit: Payer: Self-pay | Admitting: Family Medicine

## 2019-03-29 ENCOUNTER — Telehealth: Payer: Self-pay | Admitting: *Deleted

## 2019-03-29 MED FILL — XALKORI 250 MG CAPSULE: 250 | 30 days supply | Qty: 60 | Fill #1

## 2019-03-29 NOTE — Telephone Encounter (Signed)
Patient called requesting a document that states her stage of both her Breast cancer and her Lung cancer

## 2019-03-31 NOTE — Telephone Encounter (Signed)
I have forwarded this request to medical records

## 2019-03-31 NOTE — Telephone Encounter (Signed)
Patient called back stating that she has been communicating with a lawyer in regards to the lawsuit with Zantac containing a cancer causing agent.  She is asking what stage her cancer is so she can provide the info to the lawyers.  I will give her a verbal of stage IB breast cancer and Stage IV lung cancer.  If she is asking for any supporting documentation I will direct her call to our medical records.

## 2019-04-01 ENCOUNTER — Other Ambulatory Visit: Payer: Self-pay | Admitting: Oncology

## 2019-04-01 NOTE — Telephone Encounter (Signed)
Info provided by [facility] medical records dept:  IIf patient is requesting documentation for a legal case, she will need to speak to the Legal Dept for these Medical Records. Their phone number is [phone].

## 2019-04-04 ENCOUNTER — Other Ambulatory Visit: Payer: Self-pay | Admitting: Internal Medicine

## 2019-04-05 NOTE — Telephone Encounter (Signed)
Phone number provided to patient.

## 2019-04-06 ENCOUNTER — Telehealth: Payer: Self-pay

## 2019-04-06 NOTE — Telephone Encounter (Signed)
Oral Chemotherapy Pharmacy Student Encounter  Follow-Up Form  Called patient today to follow up regarding patient's oral chemotherapy medication: Jamie Matthews  Original Start date of oral chemotherapy: 07/2018  Pt reports 3 tablets/doses of Xalkori missed in the last month.  Missed dose(s) attributed to: forgetfulness. Reviewed plan for missed doses.   Pt reports the following side effects: -Lower extremity edema - ongoing since patient started taking Xalkori. Patient takes Lasix for this and reports the swelling is tolerable. -Heartburn improved since last office visit (03/17/2019). Patient takes Tums or other antacid for this. Medication list also has Dexilant and sucralfate.  Recent labs reviewed: CBC and CMP from 03/15/2019 - no relevant abnormalities noted.  New medications?: No new medications  Other Issues: No new concerns or side effects noted.  Patient knows to call the office with questions or concerns. Oral Oncology Clinic will continue to follow.  Vallery Sa, PharmD Candidate [facility] [phone]  04/06/2019 1:40 PM

## 2019-04-12 ENCOUNTER — Inpatient Hospital Stay: Payer: Medicare Other | Admitting: Oncology

## 2019-04-12 ENCOUNTER — Inpatient Hospital Stay: Payer: Medicare Other | Admitting: Hospice and Palliative Medicine

## 2019-04-12 ENCOUNTER — Inpatient Hospital Stay: Payer: Medicare Other

## 2019-04-14 ENCOUNTER — Other Ambulatory Visit: Payer: Self-pay

## 2019-04-14 ENCOUNTER — Encounter: Payer: Self-pay | Admitting: Oncology

## 2019-04-14 NOTE — Progress Notes (Signed)
Patient prescreened for appointment. Pt complains of constipation, she has tried stool softeners but not effective.

## 2019-04-15 ENCOUNTER — Inpatient Hospital Stay (HOSPITAL_BASED_OUTPATIENT_CLINIC_OR_DEPARTMENT_OTHER): Payer: Medicare Other | Admitting: Oncology

## 2019-04-15 ENCOUNTER — Other Ambulatory Visit: Payer: Self-pay

## 2019-04-15 ENCOUNTER — Inpatient Hospital Stay: Payer: Medicare Other

## 2019-04-15 ENCOUNTER — Inpatient Hospital Stay: Payer: Medicare Other | Attending: Hospice and Palliative Medicine | Admitting: Hospice and Palliative Medicine

## 2019-04-15 VITALS — BP 125/47 | HR 55 | Temp 96.8°F | Ht 63.0 in | Wt 147.0 lb

## 2019-04-15 DIAGNOSIS — Z515 Encounter for palliative care: Secondary | ICD-10-CM

## 2019-04-15 DIAGNOSIS — Z79811 Long term (current) use of aromatase inhibitors: Secondary | ICD-10-CM

## 2019-04-15 DIAGNOSIS — C50912 Malignant neoplasm of unspecified site of left female breast: Secondary | ICD-10-CM | POA: Insufficient documentation

## 2019-04-15 DIAGNOSIS — R5383 Other fatigue: Secondary | ICD-10-CM | POA: Insufficient documentation

## 2019-04-15 DIAGNOSIS — C3412 Malignant neoplasm of upper lobe, left bronchus or lung: Secondary | ICD-10-CM | POA: Diagnosis not present

## 2019-04-15 DIAGNOSIS — M8580 Other specified disorders of bone density and structure, unspecified site: Secondary | ICD-10-CM | POA: Insufficient documentation

## 2019-04-15 DIAGNOSIS — R609 Edema, unspecified: Secondary | ICD-10-CM | POA: Diagnosis not present

## 2019-04-15 DIAGNOSIS — Z8249 Family history of ischemic heart disease and other diseases of the circulatory system: Secondary | ICD-10-CM | POA: Insufficient documentation

## 2019-04-15 DIAGNOSIS — Z9181 History of falling: Secondary | ICD-10-CM | POA: Diagnosis not present

## 2019-04-15 DIAGNOSIS — Z803 Family history of malignant neoplasm of breast: Secondary | ICD-10-CM | POA: Insufficient documentation

## 2019-04-15 DIAGNOSIS — Z79899 Other long term (current) drug therapy: Secondary | ICD-10-CM | POA: Insufficient documentation

## 2019-04-15 DIAGNOSIS — C3490 Malignant neoplasm of unspecified part of unspecified bronchus or lung: Secondary | ICD-10-CM

## 2019-04-15 DIAGNOSIS — G479 Sleep disorder, unspecified: Secondary | ICD-10-CM | POA: Insufficient documentation

## 2019-04-15 DIAGNOSIS — Z8719 Personal history of other diseases of the digestive system: Secondary | ICD-10-CM | POA: Insufficient documentation

## 2019-04-15 DIAGNOSIS — M4854XA Collapsed vertebra, not elsewhere classified, thoracic region, initial encounter for fracture: Secondary | ICD-10-CM | POA: Diagnosis not present

## 2019-04-15 DIAGNOSIS — Z808 Family history of malignant neoplasm of other organs or systems: Secondary | ICD-10-CM | POA: Insufficient documentation

## 2019-04-15 DIAGNOSIS — I6782 Cerebral ischemia: Secondary | ICD-10-CM | POA: Insufficient documentation

## 2019-04-15 DIAGNOSIS — Z7189 Other specified counseling: Secondary | ICD-10-CM

## 2019-04-15 DIAGNOSIS — J479 Bronchiectasis, uncomplicated: Secondary | ICD-10-CM | POA: Diagnosis not present

## 2019-04-15 DIAGNOSIS — R7989 Other specified abnormal findings of blood chemistry: Secondary | ICD-10-CM

## 2019-04-15 DIAGNOSIS — Z833 Family history of diabetes mellitus: Secondary | ICD-10-CM | POA: Insufficient documentation

## 2019-04-15 DIAGNOSIS — I700 Atherosclerosis of aorta: Secondary | ICD-10-CM | POA: Insufficient documentation

## 2019-04-15 DIAGNOSIS — K589 Irritable bowel syndrome without diarrhea: Secondary | ICD-10-CM | POA: Diagnosis not present

## 2019-04-15 DIAGNOSIS — Z5111 Encounter for antineoplastic chemotherapy: Secondary | ICD-10-CM

## 2019-04-15 DIAGNOSIS — Z882 Allergy status to sulfonamides status: Secondary | ICD-10-CM | POA: Insufficient documentation

## 2019-04-15 DIAGNOSIS — Z170 Estrogen receptor positive status [ER+]: Secondary | ICD-10-CM | POA: Insufficient documentation

## 2019-04-15 DIAGNOSIS — R600 Localized edema: Secondary | ICD-10-CM | POA: Diagnosis not present

## 2019-04-15 DIAGNOSIS — K219 Gastro-esophageal reflux disease without esophagitis: Secondary | ICD-10-CM | POA: Insufficient documentation

## 2019-04-15 LAB — CBC WITH DIFFERENTIAL/PLATELET
Abs Immature Granulocytes: 0.07 10*3/uL (ref 0.00–0.07)
Basophils Absolute: 0 10*3/uL (ref 0.0–0.1)
Basophils Relative: 1 %
Eosinophils Absolute: 0.9 10*3/uL — ABNORMAL HIGH (ref 0.0–0.5)
Eosinophils Relative: 13 %
HCT: 31.5 % — ABNORMAL LOW (ref 36.0–46.0)
Hemoglobin: 10.1 g/dL — ABNORMAL LOW (ref 12.0–15.0)
Immature Granulocytes: 1 %
Lymphocytes Relative: 30 %
Lymphs Abs: 2.2 10*3/uL (ref 0.7–4.0)
MCH: 32.1 pg (ref 26.0–34.0)
MCHC: 32.1 g/dL (ref 30.0–36.0)
MCV: 100 fL (ref 80.0–100.0)
Monocytes Absolute: 1.2 10*3/uL — ABNORMAL HIGH (ref 0.1–1.0)
Monocytes Relative: 16 %
Neutro Abs: 3 10*3/uL (ref 1.7–7.7)
Neutrophils Relative %: 39 %
Platelets: 352 10*3/uL (ref 150–400)
RBC: 3.15 MIL/uL — ABNORMAL LOW (ref 3.87–5.11)
RDW: 13.6 % (ref 11.5–15.5)
WBC: 7.5 10*3/uL (ref 4.0–10.5)
nRBC: 0 % (ref 0.0–0.2)

## 2019-04-15 LAB — COMPREHENSIVE METABOLIC PANEL
ALT: 41 U/L (ref 0–44)
AST: 38 U/L (ref 15–41)
Albumin: 3.3 g/dL — ABNORMAL LOW (ref 3.5–5.0)
Alkaline Phosphatase: 113 U/L (ref 38–126)
Anion gap: 6 (ref 5–15)
BUN: 35 mg/dL — ABNORMAL HIGH (ref 8–23)
CO2: 28 mmol/L (ref 22–32)
Calcium: 8.3 mg/dL — ABNORMAL LOW (ref 8.9–10.3)
Chloride: 102 mmol/L (ref 98–111)
Creatinine, Ser: 1.44 mg/dL — ABNORMAL HIGH (ref 0.44–1.00)
GFR calc Af Amer: 40 mL/min — ABNORMAL LOW (ref >60)
GFR calc non Af Amer: 34 mL/min — ABNORMAL LOW (ref >60)
Glucose, Bld: 135 mg/dL — ABNORMAL HIGH (ref 70–99)
Potassium: 4.6 mmol/L (ref 3.5–5.1)
Sodium: 136 mmol/L (ref 135–145)
Total Bilirubin: 0.7 mg/dL (ref 0.3–1.2)
Total Protein: 6.1 g/dL — ABNORMAL LOW (ref 6.5–8.1)

## 2019-04-15 MED ORDER — AZITHROMYCIN 250 MG PO TABS: ORAL_TABLET | ORAL | 0 refills | Status: DC

## 2019-04-15 NOTE — Progress Notes (Signed)
[employer]  [phone] [phone]   Name: Jamie Matthews Date: 04/15/2019 MRN: [id number]  DOB: 1940-08-20  Patient Care Team: [name], MD as PCP - General (Family Medicine) Harolyn Rutherford Sallyanne Havers, MD as Consulting Physician (Obstetrics and Gynecology) Hilarie Fredrickson, Lajuan Lines, MD as Consulting Physician (Gastroenterology) Telford Nab, RN as Registered Nurse    REASON FOR CONSULTATION: Palliative Care consult requested for this 78 y.o. female with multiple medical problems including stage IV non-small cell lung cancer and metastatic breast cancer on treatment with crizotinib.  Patient was referred to palliative care to help address goals and manage ongoing symptoms.  SOCIAL HISTORY:     reports that she has never smoked. She has never used smokeless tobacco. She reports current alcohol use. She reports that she does not use drugs.   Patient is married and lives at home with her husband in a house at [facility].  She does not have any children.  Patient has a sister who is involved.  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  ADVANCE DIRECTIVES:  Not on file.  Patient says that she does have advanced directives.  Her husband is her [facility] POA with her sister is secondary.  She also has a living will.  CODE STATUS: DNR (form completed on 04/15/2019)  PAST MEDICAL HISTORY: Past Medical History:  Diagnosis Date  . AKI (acute kidney injury) ([place]) 08/17/2018  . Anxiety   . Arthritis   . Belching   . Bladder disorder    OVERACTIVE  . Bowel dysfunction    BLOCKAGE  . Cancer (H[facility])    breast  . Constipation   . Depression   . Diverticulitis   . Fibromyalgia   . GERD (gastroesophageal reflux disease)   . Hyperlipidemia   . IBS (irritable bowel syndrome)   . Internal hemorrhoids   . Lung cancer ([place]) 2020  . Memory deficits   . Murmur    asymptomatic  . Pneumonia 11/18/12  . Urinary incontinence   . Vertigo     PAST SURGICAL HISTORY:  Past Surgical History:  Procedure Laterality Date  . AXILLARY LYMPH NODE BIOPSY Left 06/08/2018   INVASIVE MAMMARY CARCINOMA  . BLADDER SUSPENSION  2004, 2012  . BREAST BIOPSY Left 06/08/2018   INVASIVE MAMMARY CARCINOMA  . CATARACT EXTRACTION W/PHACO Right 08/27/2015   Procedure: CATARACT EXTRACTION PHACO AND INTRAOCULAR LENS PLACEMENT (IOC);  Surgeon: Estill Cotta, MD;  Location: [facility] ORS;  Service: Ophthalmology;  Laterality: Right;  [place]   1:00.2 AP%  22.5 CDE  23.67 fluid casette lot #[id number]  exp05/31/2018  . CATARACT EXTRACTION W/PHACO Left 10/15/2015   Procedure: CATARACT EXTRACTION PHACO AND INTRAOCULAR LENS PLACEMENT (IOC);  Surgeon: Estill Cotta, MD;  Location: [facility] ORS;  Service: Ophthalmology;  Laterality: Left;  [place] 01:07 AP% 18.1 CDE 21.57 fluid pack lot # [id number]  . CHOLECYSTECTOMY    . COLONOSCOPY  2017  . ELECTROMAGNETIC NAVIGATION BROCHOSCOPY N/A 07/09/2018   Procedure: ELECTROMAGNETIC NAVIGATION BRONCHOSCOPY;  Surgeon: Flora Lipps, MD;  Location: [facility] ORS;  Service: Cardiopulmonary;  Laterality: N/A;  . TONSILLECTOMY  1947    HEMATOLOGY/ONCOLOGY HISTORY:  Oncology History  Primary adenocarcinoma of lung ([place])  07/21/2018 Initial Diagnosis   Primary adenocarcinoma of lung ([place])   09/28/2018 Cancer Staging   Staging form: Lung, AJCC 8th Edition - Clinical stage from 09/28/2018: Stage IVA (cT2, cN3, cM1a) - Signed by Tasia Catchings,  Talbert Cage, MD on 09/28/2018   Malignant neoplasm of left female breast [facility])  07/21/2018 Initial Diagnosis   Malignant neoplasm of left female breast ([place])   12/17/2018 Cancer Staging   Staging form: Breast, AJCC 8th Edition - Clinical stage from 12/17/2018: Stage IB (cT1b, cN1, cM0, G1, ER+, PR+, HER2-) - Signed by Earlie Server, MD on 12/17/2018     ALLERGIES:  is allergic to sulfa antibiotics.  MEDICATIONS:  Current Outpatient Medications   Medication Sig Dispense Refill  . acetaminophen (TYLENOL) 325 MG tablet Take 325 mg by mouth every 6 (six) hours as needed for mild pain.     [name] anastrozole (ARIMIDEX) 1 MG tablet TAKE 1 TABLET BY MOUTH DAILY 30 tablet 3  . atorvastatin (LIPITOR) 20 MG tablet Take 1 tablet (20 mg total) by mouth at bedtime. 90 tablet 3  . azithromycin (ZITHROMAX) 250 MG tablet Take 2 tablets on day 1, then 1 tab daily until finish 6 each 0  . benzonatate (TESSALON) 100 MG capsule TAKE 1 CAPSULE BY MOUTH THREE TIMES DAILY AS NEEDED FOR COUGH 90 capsule 0  . buPROPion (WELLBUTRIN XL) 150 MG 24 hr tablet TAKE ONE TABLET BY MOUTH EVERY DAY 90 tablet 2  . desonide (DESOWEN) 0.05 % lotion Apply 1 application topically as needed (after showering).     . DEXILANT 60 MG capsule TAKE 1 CAPSULE BY MOUTH ONCE DAILY 90 capsule 0  . Dextromethorphan-guaiFENesin (MUCINEX DM) 30-600 MG TB12 Take 1 tablet by mouth 2 (two) times daily as needed (for congestion/cough).    . docusate sodium (COLACE) 100 MG capsule Take 1 capsule (100 mg total) by mouth 2 (two) times daily. (Patient not taking: Reported on 04/14/2019) 60 capsule 0  . Fesoterodine Fumarate (TOVIAZ PO) Take 1 tablet by mouth daily.    . fluticasone (FLONASE) 50 MCG/ACT nasal spray Place 2 sprays into both nostrils daily. 16 g 6  . furosemide (LASIX) 20 MG tablet TAKE ONE TABLET EVERY DAY AS NEEDED 30 tablet 0  . ketoconazole (NIZORAL) 2 % shampoo Apply 1 application topically. 3 times a week    . lubiprostone (AMITIZA) 24 MCG capsule TAKE ONE CAPSULE TWICE A DAY WITH MEALS 60 capsule 3  . memantine (NAMENDA) 10 MG tablet TAKE ONE TABLET BY MOUTH TWICE DAILY 180 tablet 2  . Multiple Vitamin (MULTIVITAMIN WITH MINERALS) TABS tablet Take 1 tablet by mouth daily.    [name] MYRBETRIQ 50 MG TB24 tablet TAKE 1 TABLET BY MOUTH DAILY 90 tablet 1  . ondansetron (ZOFRAN) 8 MG tablet Take 8 mg by mouth every 8 (eight) hours as needed for nausea.    . polyethylene glycol (MIRALAX /  GLYCOLAX) packet Take 17 g by mouth daily.    [name] senna (SENOKOT) 8.6 MG TABS tablet Take 2 tablets (17.2 mg total) by mouth daily. Stop Colace 60 tablet 0  . sucralfate (CARAFATE) 1 g tablet Take 1 tablet (1 g total) by mouth 2 (two) times daily. (Patient not taking: Reported on 04/14/2019) 60 tablet 0  . triamcinolone (NASACORT) 55 MCG/ACT AERO nasal inhaler Place 2 sprays into the nose daily as needed (for congestion).     . venlafaxine XR (EFFEXOR-XR) 150 MG 24 hr capsule TAKE 1 CAPSULE BY MOUTH DAILY 90 capsule 1  . XALKORI 250 MG capsule TAKE 1 CAPSULE (250 MG TOTAL) BY MOUTH 2 TIMES DAILY. 60 capsule 2  . zolpidem (AMBIEN) 5 MG tablet TAKE ONE TABLET AT BEDTIME AS NEEDED FORSLEEP 30 tablet 1   No  current facility-administered medications for this visit.     VITAL SIGNS: There were no vitals taken for this visit. There were no vitals filed for this visit.  Estimated body mass index is 26.04 kg/m as calculated from the following:   Height as of an earlier encounter on 04/15/19: '5\' 3"'$  (1.6 m).   Weight as of an earlier encounter on 04/15/19: 147 lb (66.7 kg).  LABS: CBC:    Component Value Date/Time   WBC 7.5 04/15/2019 0848   HGB 10.1 (L) 04/15/2019 0848   HGB 12.6 06/12/2014 0518   HCT 31.5 (L) 04/15/2019 0848   HCT 37.9 06/12/2014 0518   PLT 352 04/15/2019 0848   PLT 204 06/12/2014 0518   MCV 100.0 04/15/2019 0848   MCV 96 06/12/2014 0518   NEUTROABS 3.0 04/15/2019 0848   NEUTROABS 8.2 (H) 06/12/2014 0518   LYMPHSABS 2.2 04/15/2019 0848   LYMPHSABS 0.6 (L) 06/12/2014 0518   MONOABS 1.2 (H) 04/15/2019 0848   MONOABS 1.0 (H) 06/12/2014 0518   EOSABS 0.9 (H) 04/15/2019 0848   EOSABS 0.1 06/12/2014 0518   BASOSABS 0.0 04/15/2019 0848   BASOSABS 0.0 06/12/2014 0518   Comprehensive Metabolic Panel:    Component Value Date/Time   NA 136 04/15/2019 0848   NA 139 06/12/2014 0518   K 4.6 04/15/2019 0848   K 4.4 06/12/2014 0518   CL 102 04/15/2019 0848   CL 104 06/12/2014  0518   CO2 28 04/15/2019 0848   CO2 26 06/12/2014 0518   BUN 35 (H) 04/15/2019 0848   BUN 17 06/12/2014 0518   CREATININE 1.44 (H) 04/15/2019 0848   CREATININE 0.80 06/12/2014 0518   GLUCOSE 135 (H) 04/15/2019 0848   GLUCOSE 93 06/12/2014 0518   CALCIUM 8.3 (L) 04/15/2019 0848   CALCIUM 8.2 (L) 06/12/2014 0518   AST 38 04/15/2019 0848   AST 32 06/10/2014 1649   ALT 41 04/15/2019 0848   ALT 37 06/10/2014 1649   ALKPHOS 113 04/15/2019 0848   ALKPHOS 92 06/10/2014 1649   BILITOT 0.7 04/15/2019 0848   BILITOT 0.6 06/10/2014 1649   PROT 6.1 (L) 04/15/2019 0848   PROT 7.6 06/10/2014 1649   ALBUMIN 3.3 (L) 04/15/2019 0848   ALBUMIN 4.1 06/10/2014 1649    RADIOGRAPHIC STUDIES: Ct Chest Wo Contrast  Result Date: 03/23/2019 CLINICAL DATA:  Lung cancer, breast cancer. EXAM: CT CHEST WITHOUT CONTRAST TECHNIQUE: Multidetector CT imaging of the chest was performed following the standard protocol without IV contrast. COMPARISON:  12/15/2018. FINDINGS: Cardiovascular: Atherosclerotic calcification of the aorta, aortic valve and coronary arteries. Heart size normal. No pericardial effusion. Mediastinum/Nodes: Mediastinal lymph nodes measure up to 10 mm in the low right paratracheal station, as before. Calcified left hilar lymph nodes. Hilar regions are otherwise difficult to evaluate without IV contrast. Left axillary lymph node measures 10 mm, stable. No right axillary or internal mammary adenopathy. Esophagus is unremarkable. Lungs/Pleura: Dominant nodule in the apical left upper lobe measures 1.8 x 2.1 cm (3/25), stable. Associated bronchiectasis and surrounding parenchymal retraction. Calcified granulomas. Additional scattered pulmonary nodules are unchanged. Very mild basilar septal thickening, left greater than right, largely new from the prior exam. No pleural fluid. Airway is unremarkable. Upper Abdomen: Visualized portions of the liver, adrenal glands, kidneys, spleen, pancreas, stomach and  bowel are grossly unremarkable. Musculoskeletal: T11 compression deformity is seen with a new area of lucency in the superior endplate (5/42). IMPRESSION: 1. Dominant left upper lobe nodule and multiple scattered additional pulmonary nodular densities are  stable. 2. Mild bilateral lower lobe septal thickening, left greater than right, largely new from 12/15/2018. Edema is considered unlikely given normal heart size. Continued attention on follow-up exams is warranted as lymphangitic carcinomatosis cannot be excluded. 3. T11 compression fracture with new lucency in the superior endplate. Difficult to exclude metastatic disease. 4. Aortic atherosclerosis (ICD10-170.0). Coronary artery calcification. Electronically Signed   By: Lorin Picket M.D.   On: 03/23/2019 09:19   Mr Jeri Cos OT Contrast  Result Date: 03/23/2019 CLINICAL DATA:  Breast and lung cancer.  Restaging. EXAM: MRI HEAD WITHOUT AND WITH CONTRAST TECHNIQUE: Multiplanar, multiecho pulse sequences of the brain and surrounding structures were obtained without and with intravenous contrast. CONTRAST:  29m GADAVIST GADOBUTROL 1 MMOL/ML IV SOLN COMPARISON:  07/20/2018 FINDINGS: Brain: Diffusion imaging does not show any acute or subacute infarction or other cause of restricted diffusion. There chronic small-vessel ischemic changes of the pons. No focal cerebellar insult. Cerebral hemispheres show pronounced generalized atrophy with moderate chronic small-vessel ischemic changes of the white matter. No cortical or large vessel territory infarction. No evidence of mass lesion, hemorrhage, hydrocephalus or extra-axial collection. Vascular: Major vessels at the base of the brain show flow. Skull and upper cervical spine: Negative Sinuses/Orbits: Clear/normal Other: None IMPRESSION: No change. No evidence of metastatic disease. Atrophy and chronic small-vessel ischemic changes. Electronically Signed   By: MNelson ChimesM.D.   On: 03/23/2019 10:46     PERFORMANCE STATUS (ECOG) : 1 - Symptomatic but completely ambulatory  Review of Systems Unless otherwise noted, a complete review of systems is negative.  Physical Exam General: NAD, frail appearing, thin Pulmonary: Unlabored Extremities: no edema, no joint deformities Skin: no rashes Neurological: Weakness but otherwise nonfocal  IMPRESSION: I met with patient in the clinic today.  Introduced palliative care services and attempted to establish therapeutic rapport.  Patient seems to have a reasonable understanding of her current medical problems and treatment plan.  She says that she has not told her husband of the expected prognosis of 1 to 2 years as she does not want him to worry.  Her husband is 89but is reportedly in reasonable health following history of a CVA.  [facility]  She does have some supportive services with nursing helping occasionally with bathing and dressing.  Otherwise, patient says that she is independent with her own care.  Symptomatically, patient seems to be doing well.  Her most distressing symptom is of GERD.  She is on Dexilant and as needed Pepcid.  We talked about not eating late dinners as she is doing currently.  We also talked about use of wedge pillows or elevating the head of the bed.  We discussed advance care planning.  Patient says that she has completed documents previously and will bring [place] when she is able.  We discussed CODE STATUS.  Patient says clearly that she would not want to be resuscitated nor have her life prolonged artificially on machines.  She would want to be a DNR/DNI.  I completed a DNR form for her to take home with her today.  PLAN: -Continue current scope of treatment -DNR/DNI -Follow up telephone visit in 3-4 weeks   Patient expressed understanding and was in agreement with this plan. She also understands that She can call the clinic at any time with any questions, concerns, or complaints.      Time Total: 30 minutes  Visit consisted of counseling and education dealing with the  complex and emotionally intense issues of symptom management and palliative care in the setting of serious and potentially life-threatening illness.Greater than 50%  of this time was spent counseling and coordinating care related to the above assessment and plan.  Signed by: Altha Harm, PhD, NP-C

## 2019-04-15 NOTE — Progress Notes (Signed)
Patient stated that she did not sleep well last night. Patient seems to be off balance. I offered a wheelchair and she declined because she stated that she was fine with her walker. This will be mentioned to provider.

## 2019-04-15 NOTE — Progress Notes (Signed)
Hematology/Oncology follow up note [facility] [phone] [phone]   Patient Care Team: [name], MD as PCP - General (Family Medicine) Osborne Oman, MD as Consulting Physician (Obstetrics and Gynecology) Pyrtle, Lajuan Lines, MD as Consulting Physician (Gastroenterology) Telford Nab, RN as Registered Nurse   REASON FOR VISIT:  Follow up for management of lung cancer and breast cancer  HISTORY OF PRESENTING ILLNESS:  Jamie Matthews is a  78 y.o.  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 05/05/2018 bilateral diagnostic breast mammogram showed suspicious mass 1.1cm  in the 12:00 retroareolar region of the left breast and the left axillary lymph node. 06/08/2018 patient status post a left breast retroareolar and left axillary lymph node biopsy. Pathology showed invasive mammary carcinoma, no special type, grade 1, left axillary lymph node positive for invasive mammary carcinoma clinically metastatic.  Background lymph node architecture is not identified. ER> 90% PR> 90%, HER-2 negative.  PET scan done which unfortunately showed additional hypermetabolic bilateral hilar lymph nodes as well as left upper lobe lung mass which may represent a focus of metastatic disease from breast, or primary lung neoplasm.  There is multiple additional small pulmonary nodules are scattered throughout both lungs which are worrisome for metastasis.  Index nodule within the medial right upper lobe measures 7 mm,, peri-broncho-vascular nodule in the right lower lobe measures 1.2 cm.  # Stage IV lung cancer-  Biopsy of lung mass left upper lobe showed non-small cell lung cancer, favor adenocarcinoma.  #NGS came back patient has PD L1 is 70% TPS, MET fusion mutation.  #Mid-March 2020 started on crizotinib  Bilateral lower extremity edema, right >left, Ultrasound venous right 09/20/2018 no DVT.  2D echo  10/10/2018 showed LVEF 55 to 60% Most likely secondary to crizotinib side effects.  # 12/15/2018 CT chest images were independently reviewed and discussed with patient. Dominant left upper lobe nodule and multiple scattered irregular pulmonary parenchymal nodular lesions are stable. Mild basilar pulmonary parenchymal septal thickening is new and can be seen with pulmonary edema. Patient reports no shortness of breath asymptomatic  #12/29/2018 unilateral left diagnostic mammogram with ultrasound images were independently reviewed and discussed with patient. Slight interval reduction of the size of the left retroareolar breast mass, 9 x 6 x 7 mm, comparing to 10 x 9 x 8 mm. Axillary lymph node measured 1.3 x 1.0 x 1.0 cm, previously 2.2 x 1.4 x 1.5 cm,  there were 2 other abdominal lymph nodes in the left axilla, which were not reported in previous ultrasound. Discussed with patient that overall, breast cancer responded to endocrine treatments.  Additional 2 lymph nodes need to be closely monitored.  Recommend patient to continue Arimidex.   INTERVAL HISTORY [name] is a 78 y.o. female who has above history reviewed by me today presents for follow up visit for evaluation of tolerability of treatment for breast cancer and Stage IV lung cancer. Patient reports that she did not sleep well last night.  Has been off balance for the past 2 weeks.  Denies any headache, double vision.   She walks with her walker.  Reports that recently she had a fall around the time she had a CT scan done.  Denies any back pain today. Also reports having productive cough, light yellowish sputum for the past 2 weeks.  No fever or chills. Patient has been taking on crizotinib.  Tolerating well except bilateral lower extremity edema.  Breast cancer, takes Arimidex.  Tolerates well.  Denies any side effects... Review of Systems  Constitutional: Positive for fatigue. Negative for appetite change, chills  and fever.  HENT:   Negative for hearing loss and voice change.   Eyes: Negative for eye problems.  Respiratory: Positive for cough. Negative for chest tightness.   Cardiovascular: Positive for leg swelling. Negative for chest pain.  Gastrointestinal: Negative for abdominal distention, abdominal pain and blood in stool.  Endocrine: Negative for hot flashes.  Genitourinary: Negative for difficulty urinating and frequency.   Musculoskeletal: Negative for arthralgias.  Skin: Negative for itching and rash.  Neurological: Negative for extremity weakness.       Had a fall recently  Hematological: Negative for adenopathy.  Psychiatric/Behavioral: Positive for sleep disturbance. Negative for confusion.    MEDICAL HISTORY:  Past Medical History:  Diagnosis Date   AKI (acute kidney injury) ([place]) 08/17/2018   Anxiety    Arthritis    Belching    Bladder disorder    OVERACTIVE   Bowel dysfunction    BLOCKAGE   Cancer (HCC)    breast   Constipation    Depression    Diverticulitis    Fibromyalgia    GERD (gastroesophageal reflux disease)    Hyperlipidemia    IBS (irritable bowel syndrome)    Internal hemorrhoids    Lung cancer ([place]) 2020   Memory deficits    Murmur    asymptomatic   Pneumonia 11/18/12   Urinary incontinence    Vertigo     SURGICAL HISTORY: Past Surgical History:  Procedure Laterality Date   AXILLARY LYMPH NODE BIOPSY Left 06/08/2018   INVASIVE MAMMARY CARCINOMA   BLADDER SUSPENSION  2004, 2012   BREAST BIOPSY Left 06/08/2018   INVASIVE MAMMARY CARCINOMA   CATARACT EXTRACTION W/PHACO Right 08/27/2015   Procedure: CATARACT EXTRACTION PHACO AND INTRAOCULAR LENS PLACEMENT ([place]);  Surgeon: Estill Cotta, MD;  Location: [facility] ORS;  Service: Ophthalmology;  Laterality: Right;  [place]   1:00.2 AP%  22.5 CDE  23.67 fluid casette lot #[id number]  exp05/31/2018   CATARACT EXTRACTION W/PHACO Left 10/15/2015   Procedure: CATARACT EXTRACTION PHACO AND  INTRAOCULAR LENS PLACEMENT ([facility]);  Surgeon: Estill Cotta, MD;  Location: [facility] ORS;  Service: Ophthalmology;  Laterality: Left;  [place] 01:07 AP% 18.1 CDE 21.57 fluid pack lot # [id number]   CHOLECYSTECTOMY     COLONOSCOPY  2017   ELECTROMAGNETIC NAVIGATION BROCHOSCOPY N/A 07/09/2018   Procedure: ELECTROMAGNETIC NAVIGATION BRONCHOSCOPY;  Surgeon: Flora Lipps, MD;  Location: [facility] ORS;  Service: Cardiopulmonary;  Laterality: N/A;   TONSILLECTOMY  1947    SOCIAL HISTORY: Social History   Socioeconomic History   Marital status: Married    Spouse name: Scientist, forensic   Number of children: 0   Years of education: Master's Degree   Highest education level: Not on file  Occupational History   Occupation: Retired  Scientist, product/process development strain: Not hard at [employer] insecurity    Worry: Not on file    Inability: Not on Lexicographer needs    Medical: Not on file    Non-medical: Not on file  Tobacco Use   Smoking status: Never Smoker   Smokeless tobacco: Never Used  Substance and Sexual Activity   Alcohol use: Yes    Comment: rare wine   Drug use: No   Sexual activity: Not Currently  Lifestyle   Physical activity    Days per week: Not on file  Minutes per session: Not on file   Stress: Not on file  Relationships   Social connections    Talks on phone: Not on file    Gets together: Not on file    Attends religious service: Not on file    Active member of club or organization: Not on file    Attends meetings of clubs or organizations: Not on file    Relationship status: Not on file   Intimate partner violence    Fear of current or ex partner: Not on file    Emotionally abused: Not on file    Physically abused: Not on file    Forced sexual activity: Not on file  Other Topics Concern   Not on file  Social History Narrative   Recently moved with her husband to [place] from [place].   Husband is a retired Pharmacist, community.   No children.    She is a retired Pharmacist, hospital.   Enjoys: Chief Technology Officer, reading - mysteries and biographies, cooking   Exercise: walking, gardening   Diet: low appetite due to cancer treatment, grazing   She is a DNR.    FAMILY HISTORY: Family History  Problem Relation Age of Onset   Diabetes Father    Heart disease Father    Lymphoma Father    Heart disease Mother    Breast cancer Sister 4   Colon cancer Neg Hx    Esophageal cancer Neg Hx    Rectal cancer Neg Hx    Stomach cancer Neg Hx    Bladder Cancer Neg Hx    Kidney cancer Neg Hx     ALLERGIES:  is allergic to sulfa antibiotics.  MEDICATIONS:  Current Outpatient Medications  Medication Sig Dispense Refill   acetaminophen (TYLENOL) 325 MG tablet Take 325 mg by mouth every 6 (six) hours as needed for mild pain.      anastrozole (ARIMIDEX) 1 MG tablet TAKE 1 TABLET BY MOUTH DAILY 30 tablet 3   atorvastatin (LIPITOR) 20 MG tablet Take 1 tablet (20 mg total) by mouth at bedtime. 90 tablet 3   benzonatate (TESSALON) 100 MG capsule TAKE 1 CAPSULE BY MOUTH THREE TIMES DAILY AS NEEDED FOR COUGH 90 capsule 0   buPROPion (WELLBUTRIN XL) 150 MG 24 hr tablet TAKE ONE TABLET BY MOUTH EVERY DAY 90 tablet 2   desonide (DESOWEN) 0.05 % lotion Apply 1 application topically as needed (after showering).      DEXILANT 60 MG capsule TAKE 1 CAPSULE BY MOUTH ONCE DAILY 90 capsule 0   Dextromethorphan-guaiFENesin (MUCINEX DM) 30-600 MG TB12 Take 1 tablet by mouth 2 (two) times daily as needed (for congestion/cough).     Fesoterodine Fumarate (TOVIAZ PO) Take 1 tablet by mouth daily.     fluticasone (FLONASE) 50 MCG/ACT nasal spray Place 2 sprays into both nostrils daily. 16 g 6   furosemide (LASIX) 20 MG tablet TAKE ONE TABLET EVERY DAY AS NEEDED 30 tablet 0   ketoconazole (NIZORAL) 2 % shampoo Apply 1 application topically. 3 times a week     lubiprostone (AMITIZA) 24 MCG capsule TAKE ONE CAPSULE TWICE A DAY WITH MEALS 60 capsule 3    memantine (NAMENDA) 10 MG tablet TAKE ONE TABLET BY MOUTH TWICE DAILY 180 tablet 2   Multiple Vitamin (MULTIVITAMIN WITH MINERALS) TABS tablet Take 1 tablet by mouth daily.     MYRBETRIQ 50 MG TB24 tablet TAKE 1 TABLET BY MOUTH DAILY 90 tablet 1   polyethylene glycol (MIRALAX / GLYCOLAX) packet  Take 17 g by mouth daily.     senna (SENOKOT) 8.6 MG TABS tablet Take 2 tablets (17.2 mg total) by mouth daily. Stop Colace 60 tablet 0   triamcinolone (NASACORT) 55 MCG/ACT AERO nasal inhaler Place 2 sprays into the nose daily as needed (for congestion).      venlafaxine XR (EFFEXOR-XR) 150 MG 24 hr capsule TAKE 1 CAPSULE BY MOUTH DAILY 90 capsule 1   XALKORI 250 MG capsule TAKE 1 CAPSULE (250 MG TOTAL) BY MOUTH 2 TIMES DAILY. 60 capsule 2   zolpidem (AMBIEN) 5 MG tablet TAKE ONE TABLET AT BEDTIME AS NEEDED FORSLEEP 30 tablet 1   azithromycin (ZITHROMAX) 250 MG tablet Take 2 tablets on day 1, then 1 tab daily until finish 6 each 0   docusate sodium (COLACE) 100 MG capsule Take 1 capsule (100 mg total) by mouth 2 (two) times daily. (Patient not taking: Reported on 04/14/2019) 60 capsule 0   ondansetron (ZOFRAN) 8 MG tablet Take 8 mg by mouth every 8 (eight) hours as needed for nausea.     sucralfate (CARAFATE) 1 g tablet Take 1 tablet (1 g total) by mouth 2 (two) times daily. (Patient not taking: Reported on 04/14/2019) 60 tablet 0   No current facility-administered medications for this visit.      PHYSICAL EXAMINATION: ECOG PERFORMANCE STATUS: 2 - Symptomatic, <50% confined to bed Vitals:   04/15/19 0936  BP: (!) 125/47  Pulse: (!) 55  Temp: (!) 96.8 F (36 C)   Filed Weights   04/15/19 0936  Weight: 147 lb (66.7 kg)    Physical Exam  Constitutional: She is oriented to person, place, and time. No distress.  Frail female, walks with a walker  HENT:  Head: Normocephalic and atraumatic.  Nose: Nose normal.  Mouth/Throat: Oropharynx is clear and moist. No oropharyngeal exudate.    Eyes: Pupils are equal, round, and reactive to light. EOM are normal. No scleral icterus.  Neck: Normal range of motion. Neck supple.  Cardiovascular: Normal rate and regular rhythm.  No murmur heard. Pulmonary/Chest: Effort normal. No respiratory distress. She has wheezes. She has no rales. She exhibits no tenderness.  Abdominal: Soft. She exhibits no distension. There is no abdominal tenderness.  Musculoskeletal: Normal range of motion.        General: Edema present.     Comments: Bilateral lower extremity 3+ edema, right worse than left.   Neurological: She is alert and oriented to person, place, and time. No cranial nerve deficit. She exhibits normal muscle tone. Coordination normal.  Skin: Skin is warm and dry. She is not diaphoretic. No erythema.  Psychiatric: Affect normal.     CMP Latest Ref Rng & Units 04/15/2019  Glucose 70 - 99 mg/dL 135(H)  BUN 8 - 23 mg/dL 35(H)  Creatinine 0.44 - 1.00 mg/dL 1.44(H)  Sodium 135 - 145 mmol/L 136  Potassium 3.5 - 5.1 mmol/L 4.6  Chloride 98 - 111 mmol/L 102  CO2 22 - 32 mmol/L 28  Calcium 8.9 - 10.3 mg/dL 8.3(L)  Total Protein 6.5 - 8.1 g/dL 6.1(L)  Total Bilirubin 0.3 - 1.2 mg/dL 0.7  Alkaline Phos 38 - 126 U/L 113  AST 15 - 41 U/L 38  ALT 0 - 44 U/L 41   CBC Latest Ref Rng & Units 04/15/2019  WBC 4.0 - 10.5 K/uL 7.5  Hemoglobin 12.0 - 15.0 g/dL 10.1(L)  Hematocrit 36.0 - 46.0 % 31.5(L)  Platelets 150 - 400 K/uL 352    LABORATORY DATA:  I have reviewed the data as listed Lab Results  Component Value Date   WBC 7.5 04/15/2019   HGB 10.1 (L) 04/15/2019   HCT 31.5 (L) 04/15/2019   MCV 100.0 04/15/2019   PLT 352 04/15/2019   Recent Labs    02/11/19 1332 03/15/19 1310 04/15/19 0848  NA 140 139 136  K 4.4 3.9 4.6  CL 100 102 102  CO2 32 29 28  GLUCOSE 94 94 135*  BUN 26* 26* 35*  CREATININE 1.31* 1.34* 1.44*  CALCIUM 8.2* 7.9* 8.3*  GFRNONAA 38* 37* 34*  GFRAA 44* 43* 40*  PROT 6.1* 6.5 6.1*  ALBUMIN 3.2* 3.4*  3.3*  AST 45* 30 38  ALT 59* 33 41  ALKPHOS 125 112 113  BILITOT 0.4 0.6 0.7   Iron/TIBC/Ferritin/ %Sat No results found for: IRON, TIBC, FERRITIN, IRONPCTSAT   RADIOGRAPHIC STUDIES: I have personally reviewed the radiological images as listed and agreed with the findings in the report. Ct Chest Wo Contrast  Result Date: 03/23/2019 CLINICAL DATA:  Lung cancer, breast cancer. EXAM: CT CHEST WITHOUT CONTRAST TECHNIQUE: Multidetector CT imaging of the chest was performed following the standard protocol without IV contrast. COMPARISON:  12/15/2018. FINDINGS: Cardiovascular: Atherosclerotic calcification of the aorta, aortic valve and coronary arteries. Heart size normal. No pericardial effusion. Mediastinum/Nodes: Mediastinal lymph nodes measure up to 10 mm in the low right paratracheal station, as before. Calcified left hilar lymph nodes. Hilar regions are otherwise difficult to evaluate without IV contrast. Left axillary lymph node measures 10 mm, stable. No right axillary or internal mammary adenopathy. Esophagus is unremarkable. Lungs/Pleura: Dominant nodule in the apical left upper lobe measures 1.8 x 2.1 cm (3/25), stable. Associated bronchiectasis and surrounding parenchymal retraction. Calcified granulomas. Additional scattered pulmonary nodules are unchanged. Very mild basilar septal thickening, left greater than right, largely new from the prior exam. No pleural fluid. Airway is unremarkable. Upper Abdomen: Visualized portions of the liver, adrenal glands, kidneys, spleen, pancreas, stomach and bowel are grossly unremarkable. Musculoskeletal: T11 compression deformity is seen with a new area of lucency in the superior endplate (6/38). IMPRESSION: 1. Dominant left upper lobe nodule and multiple scattered additional pulmonary nodular densities are stable. 2. Mild bilateral lower lobe septal thickening, left greater than right, largely new from 12/15/2018. Edema is considered unlikely given normal  heart size. Continued attention on follow-up exams is warranted as lymphangitic carcinomatosis cannot be excluded. 3. T11 compression fracture with new lucency in the superior endplate. Difficult to exclude metastatic disease. 4. Aortic atherosclerosis (ICD10-170.0). Coronary artery calcification. Electronically Signed   By: Lorin Picket M.D.   On: 03/23/2019 09:19   Mr Jeri Cos LH Contrast  Result Date: 03/23/2019 CLINICAL DATA:  Breast and lung cancer.  Restaging. EXAM: MRI HEAD WITHOUT AND WITH CONTRAST TECHNIQUE: Multiplanar, multiecho pulse sequences of the brain and surrounding structures were obtained without and with intravenous contrast. CONTRAST:  57m GADAVIST GADOBUTROL 1 MMOL/ML IV SOLN COMPARISON:  07/20/2018 FINDINGS: Brain: Diffusion imaging does not show any acute or subacute infarction or other cause of restricted diffusion. There chronic small-vessel ischemic changes of the pons. No focal cerebellar insult. Cerebral hemispheres show pronounced generalized atrophy with moderate chronic small-vessel ischemic changes of the white matter. No cortical or large vessel territory infarction. No evidence of mass lesion, hemorrhage, hydrocephalus or extra-axial collection. Vascular: Major vessels at the base of the brain show flow. Skull and upper cervical spine: Negative Sinuses/Orbits: Clear/normal Other: None IMPRESSION: No change. No evidence of metastatic disease. Atrophy  and chronic small-vessel ischemic changes. Electronically Signed   By: Nelson Chimes M.D.   On: 03/23/2019 10:46      ASSESSMENT & PLAN:  1. Primary adenocarcinoma of lung, unspecified laterality ([place])   2. Malignant neoplasm of left female breast, unspecified estrogen receptor status, unspecified site of breast ([place])   3. Encounter for antineoplastic chemotherapy   4. Aromatase inhibitor use   5. Osteopenia, unspecified location   6. At risk for falls   Cancer Staging Malignant neoplasm of left female breast  [facility]) Staging form: Breast, AJCC 8th Edition - Clinical stage from 12/17/2018: Stage IB (cT1b, cN1, cM0, G1, ER+, PR+, HER2-) - Signed by Earlie Server, MD on 12/17/2018  Primary adenocarcinoma of lung [facility]) Staging form: Lung, AJCC 8th Edition - Clinical stage from 09/28/2018: Stage IVA (cT2, cN3, cM1a) - Signed by Earlie Server, MD on 09/28/2018   Patient has 2 primaries.   Stage IV lung adenocarcinoma, met fusion mutation cT2 N3 M1a PDL 1 TPS more than 70%  Patient clinically tolerating crizotinib well. Recent interval CT images were independent reviewed by me and discussed with patient. Dominant left upper lobe nodule and multiple scattered additional pulmonary nodular densities are stable. Patient has mild bilateral lower lobe septal thickening.  Questionable lymphatic carcinomatosis versus edema. I would proceed with short-term follow-up with PET scan in December for further evaluation. For now continue crizotinib.  MRI brain images were independently reviewed by me.  No CNS metastasis.  Falls, fall precaution discussed with patient.  Home PT T11 compression fracture likely secondary to recent fall.  Pending PET scan work-up.  Stage Ib breast cancer, ER PR positive HER-2 negative.  Continue Arimidex. Osteopenia, in the context of chronic Arimidex use.  Fall risk.  History of pelvic fracture. Awaiting dental clearance to start bisphosphonate. Advised patient to continue take calcium and vitamin D.  #Cough, likely bronchitis.  I will treat with empiric course of azithromycin for 5 days.  Prescription sent to pharmacy. Patient will see palliative care service today..  All questions were answered. The patient knows to call the clinic with any problems questions or concerns. Return of visit: 4 weeks.  Orders Placed This Encounter  Procedures   NM PET Image Restag (PS) Skull Base To Thigh    Standing Status:   Future    Standing Expiration Date:   04/14/2020    Order Specific Question:   **  REASON FOR EXAM (FREE TEXT)    Answer:   lung cancer breast cancer follow up .    Order Specific Question:   If indicated for the ordered procedure, I authorize the administration of a radiopharmaceutical per Radiology protocol    Answer:   Yes    Order Specific Question:   Preferred imaging location?    Answer:   [facility]    Order Specific Question:   Radiology Contrast Protocol - do NOT remove file path    Answer:   [email or web address]     Earlie Server, MD, PhD 04/15/2019

## 2019-04-19 ENCOUNTER — Encounter: Payer: Self-pay | Admitting: Family Medicine

## 2019-04-19 ENCOUNTER — Ambulatory Visit: Payer: Medicare Other | Admitting: Family Medicine

## 2019-04-19 ENCOUNTER — Ambulatory Visit (INDEPENDENT_AMBULATORY_CARE_PROVIDER_SITE_OTHER): Payer: Medicare Other | Admitting: Family Medicine

## 2019-04-19 DIAGNOSIS — J069 Acute upper respiratory infection, unspecified: Secondary | ICD-10-CM

## 2019-04-19 MED ORDER — BENZONATATE 100 MG PO CAPS: 100.0000 mg | ORAL_CAPSULE | Freq: Three times a day (TID) | ORAL | 0 refills | Status: DC | PRN

## 2019-04-19 NOTE — Progress Notes (Signed)
Virtual Visit via Telephone Note  I connected with Jamie Matthews on 04/19/19 at  3:20 PM EST by telephone and verified that I am speaking with the correct person using two identifiers.   I discussed the limitations, risks, security and privacy concerns of performing an evaluation and management service by telephone and the availability of in person appointments. I also discussed with the patient that there may be a patient responsible charge related to this service. The patient expressed understanding and agreed to proceed.  Patient location: in the car Provider Location: [place] Participants: [name] and Jamie Matthews   History of Present Illness: Chief Complaint  Patient presents with  . Cough    some mucus, yellowish Denies chest tightness,sob, Sx started wednesday  . Wheezing    only slightly at night  . Abdominal Pain  . Diarrhea  . Nausea    Cough This is a new problem. The current episode started in the past 7 days. The problem has been gradually improving. The cough is productive of purulent sputum. Associated symptoms include nasal congestion, rhinorrhea and wheezing. Pertinent negatives include no chills, ear pain, fever, headaches, myalgias, postnasal drip, sore throat or shortness of breath. The symptoms are aggravated by lying down. She has tried prescription cough suppressant (antibiotics) for the symptoms.   2 loose bowel movement  Sick contact: none  Tessalon pearls helping Has zofran if nausea is worse  #Sore ribs - fell a little while ago - not bad enough for medication  - hurts just when she presses  Review of Systems  Constitutional: Negative for chills and fever.  HENT: Positive for congestion and rhinorrhea. Negative for ear pain, postnasal drip and sore throat.   Respiratory: Positive for cough and wheezing. Negative for shortness of breath.   Gastrointestinal: Positive for abdominal pain, diarrhea,  nausea and vomiting.  Musculoskeletal: Negative for myalgias.  Neurological: Negative for headaches.    03/10/2019: Virtual - GERD and Constipation - mailed diet info. Switched to pepcid BID. BID amitiza and miralax/increase fiber and water   Observations/Objective: There were no vitals taken for this visit.  Phone visit:  Patient speaking in complete sentences, occasional coughing No distress Alert and oriented Normal mood  Assessment and Plan: Problem List Items Addressed This Visit    None    Visit Diagnoses    Viral URI with cough    -  Primary   Relevant Medications   benzonatate (TESSALON) 100 MG capsule     Discussed OTC treatment for viral illness Given symptoms possible covid-19 and would recommend being tested - pt declined testing at this time Stressed importance of isolating until symptoms resolve ER precautions given Refill of tessalon pearls  Call back if fevers, worsening wheezing (could consider albuterol trial)  Tums and carafate for indigestion    Follow Up Instructions:  Return if symptoms worsen or fail to improve.   I discussed the assessment and treatment plan with the patient. The patient was provided an opportunity to ask questions and all were answered. The patient agreed with the plan and demonstrated an understanding of the instructions.   The patient was advised to call back or seek an in-person evaluation if the symptoms worsen or if the condition fails to improve as anticipated.  I provided 12 minutes of non-face-to-face time during this encounter.    [name], MD

## 2019-05-02 ENCOUNTER — Other Ambulatory Visit: Payer: Self-pay | Admitting: Family Medicine

## 2019-05-02 ENCOUNTER — Other Ambulatory Visit: Payer: Self-pay | Admitting: Internal Medicine

## 2019-05-03 NOTE — Telephone Encounter (Signed)
[id number]-[id number] see refill request/thx dmf

## 2019-05-04 ENCOUNTER — Encounter: Payer: Medicare Other | Admitting: Hospice and Palliative Medicine

## 2019-05-05 ENCOUNTER — Inpatient Hospital Stay: Payer: Medicare Other | Attending: Hospice and Palliative Medicine | Admitting: Hospice and Palliative Medicine

## 2019-05-05 DIAGNOSIS — C773 Secondary and unspecified malignant neoplasm of axilla and upper limb lymph nodes: Secondary | ICD-10-CM | POA: Insufficient documentation

## 2019-05-05 DIAGNOSIS — R296 Repeated falls: Secondary | ICD-10-CM | POA: Insufficient documentation

## 2019-05-05 DIAGNOSIS — R609 Edema, unspecified: Secondary | ICD-10-CM | POA: Insufficient documentation

## 2019-05-05 DIAGNOSIS — M8580 Other specified disorders of bone density and structure, unspecified site: Secondary | ICD-10-CM | POA: Insufficient documentation

## 2019-05-05 DIAGNOSIS — S2241XA Multiple fractures of ribs, right side, initial encounter for closed fracture: Secondary | ICD-10-CM | POA: Insufficient documentation

## 2019-05-05 DIAGNOSIS — Z8249 Family history of ischemic heart disease and other diseases of the circulatory system: Secondary | ICD-10-CM | POA: Insufficient documentation

## 2019-05-05 DIAGNOSIS — I6782 Cerebral ischemia: Secondary | ICD-10-CM | POA: Insufficient documentation

## 2019-05-05 DIAGNOSIS — Z83438 Family history of other disorder of lipoprotein metabolism and other lipidemia: Secondary | ICD-10-CM | POA: Insufficient documentation

## 2019-05-05 DIAGNOSIS — C50112 Malignant neoplasm of central portion of left female breast: Secondary | ICD-10-CM | POA: Insufficient documentation

## 2019-05-05 DIAGNOSIS — C3490 Malignant neoplasm of unspecified part of unspecified bronchus or lung: Secondary | ICD-10-CM | POA: Diagnosis not present

## 2019-05-05 DIAGNOSIS — Z803 Family history of malignant neoplasm of breast: Secondary | ICD-10-CM | POA: Insufficient documentation

## 2019-05-05 DIAGNOSIS — Z8719 Personal history of other diseases of the digestive system: Secondary | ICD-10-CM | POA: Insufficient documentation

## 2019-05-05 DIAGNOSIS — Z833 Family history of diabetes mellitus: Secondary | ICD-10-CM | POA: Insufficient documentation

## 2019-05-05 DIAGNOSIS — Z515 Encounter for palliative care: Secondary | ICD-10-CM

## 2019-05-05 DIAGNOSIS — Z9049 Acquired absence of other specified parts of digestive tract: Secondary | ICD-10-CM | POA: Insufficient documentation

## 2019-05-05 DIAGNOSIS — Z882 Allergy status to sulfonamides status: Secondary | ICD-10-CM | POA: Insufficient documentation

## 2019-05-05 DIAGNOSIS — K589 Irritable bowel syndrome without diarrhea: Secondary | ICD-10-CM | POA: Insufficient documentation

## 2019-05-05 DIAGNOSIS — I700 Atherosclerosis of aorta: Secondary | ICD-10-CM | POA: Insufficient documentation

## 2019-05-05 DIAGNOSIS — R222 Localized swelling, mass and lump, trunk: Secondary | ICD-10-CM | POA: Insufficient documentation

## 2019-05-05 DIAGNOSIS — Z79899 Other long term (current) drug therapy: Secondary | ICD-10-CM | POA: Insufficient documentation

## 2019-05-05 DIAGNOSIS — J479 Bronchiectasis, uncomplicated: Secondary | ICD-10-CM | POA: Insufficient documentation

## 2019-05-05 DIAGNOSIS — Z170 Estrogen receptor positive status [ER+]: Secondary | ICD-10-CM | POA: Insufficient documentation

## 2019-05-05 DIAGNOSIS — N179 Acute kidney failure, unspecified: Secondary | ICD-10-CM | POA: Insufficient documentation

## 2019-05-05 DIAGNOSIS — C3412 Malignant neoplasm of upper lobe, left bronchus or lung: Secondary | ICD-10-CM | POA: Insufficient documentation

## 2019-05-05 DIAGNOSIS — Z79811 Long term (current) use of aromatase inhibitors: Secondary | ICD-10-CM | POA: Insufficient documentation

## 2019-05-05 MED ORDER — DEXILANT 30 MG PO CPDR: 30.0000 mg | DELAYED_RELEASE_CAPSULE | Freq: Every day | ORAL | 3 refills | Status: DC

## 2019-05-05 NOTE — Progress Notes (Signed)
Virtual Visit via Telephone Note  I connected with Jamie Matthews on 05/05/19 at  2:30 PM EST by telephone and verified that I am speaking with the correct person using two identifiers.   I discussed the limitations, risks, security and privacy concerns of performing an evaluation and management service by telephone and the availability of in person appointments. I also discussed with the patient that there may be a patient responsible charge related to this service. The patient expressed understanding and agreed to proceed.   History of Present Illness: Palliative Care consult requested for this 78 y.o. female with multiple medical problems including stage IV non-small cell lung cancer and metastatic breast cancer on treatment with crizotinib.  Patient was referred to palliative care to help address goals and manage ongoing symptoms.   Observations/Objective: I called and spoke with patient by phone. She reports that she is doing reasonably well. She denies distressing symptoms today. She does have chronic GERD and reports reflux has been worse off the Dexilant, which she has been unable to have refilled recently. Will refill. Drop dose to 30mg  from 60mg  and monitor for efficacy.   Patient also says she fell recently. Will refer to clinic OT with consideration for outpatient PT vs home health.   Patient says she is coping well.   Assessment and Plan: Stage IV NSCLC - pending PET on 12/15. Followed by Dr. Tasia Catchings.   GERD - Dexilant 30mg  daily  Weakness - referral to [facility]  Follow Up Instructions: Follow up telephone visit in 3-4 weeks   I discussed the assessment and treatment plan with the patient. The patient was provided an opportunity to ask questions and all were answered. The patient agreed with the plan and demonstrated an understanding of the instructions.   The patient was advised to call back or seek an in-person evaluation if the symptoms worsen or if the condition  fails to improve as anticipated.  I provided 5 minutes of non-face-to-face time during this encounter.   Irean Hong, NP

## 2019-05-10 ENCOUNTER — Other Ambulatory Visit: Payer: Self-pay

## 2019-05-10 ENCOUNTER — Ambulatory Visit
Admission: RE | Admit: 2019-05-10 | Discharge: 2019-05-10 | Disposition: A | Discharge status: Home / Self Care | Payer: Medicare Other | Source: Ambulatory Visit | Attending: Oncology | Admitting: Oncology

## 2019-05-10 DIAGNOSIS — C3490 Malignant neoplasm of unspecified part of unspecified bronchus or lung: Secondary | ICD-10-CM | POA: Diagnosis present

## 2019-05-10 DIAGNOSIS — C50912 Malignant neoplasm of unspecified site of left female breast: Secondary | ICD-10-CM | POA: Insufficient documentation

## 2019-05-10 LAB — GLUCOSE, CAPILLARY: Glucose-Capillary: 94 mg/dL (ref 70–99)

## 2019-05-10 MED ORDER — FLUDEOXYGLUCOSE F - 18 (FDG) INJECTION
7.6000 | Freq: Once | INTRAVENOUS | Status: AC | PRN
  Administered 2019-05-10: 8 via INTRAVENOUS

## 2019-05-11 ENCOUNTER — Inpatient Hospital Stay: Payer: Medicare Other

## 2019-05-11 ENCOUNTER — Inpatient Hospital Stay: Payer: Medicare Other | Admitting: Occupational Therapy

## 2019-05-11 ENCOUNTER — Other Ambulatory Visit: Payer: Self-pay | Admitting: Oncology

## 2019-05-13 ENCOUNTER — Other Ambulatory Visit: Payer: Medicare Other

## 2019-05-13 ENCOUNTER — Telehealth: Payer: Self-pay

## 2019-05-13 ENCOUNTER — Inpatient Hospital Stay: Payer: Medicare Other

## 2019-05-13 ENCOUNTER — Encounter: Payer: Self-pay | Admitting: Oncology

## 2019-05-13 ENCOUNTER — Other Ambulatory Visit: Payer: Self-pay

## 2019-05-13 ENCOUNTER — Inpatient Hospital Stay (HOSPITAL_BASED_OUTPATIENT_CLINIC_OR_DEPARTMENT_OTHER): Payer: Medicare Other | Admitting: Oncology

## 2019-05-13 VITALS — BP 140/73 | HR 49 | Temp 96.6°F | Resp 16 | Wt 150.0 lb

## 2019-05-13 DIAGNOSIS — Z5111 Encounter for antineoplastic chemotherapy: Secondary | ICD-10-CM | POA: Diagnosis not present

## 2019-05-13 DIAGNOSIS — S2241XA Multiple fractures of ribs, right side, initial encounter for closed fracture: Secondary | ICD-10-CM | POA: Diagnosis not present

## 2019-05-13 DIAGNOSIS — Z79811 Long term (current) use of aromatase inhibitors: Secondary | ICD-10-CM

## 2019-05-13 DIAGNOSIS — I6782 Cerebral ischemia: Secondary | ICD-10-CM | POA: Diagnosis not present

## 2019-05-13 DIAGNOSIS — C3412 Malignant neoplasm of upper lobe, left bronchus or lung: Secondary | ICD-10-CM | POA: Diagnosis present

## 2019-05-13 DIAGNOSIS — Z803 Family history of malignant neoplasm of breast: Secondary | ICD-10-CM | POA: Diagnosis not present

## 2019-05-13 DIAGNOSIS — Z170 Estrogen receptor positive status [ER+]: Secondary | ICD-10-CM | POA: Diagnosis not present

## 2019-05-13 DIAGNOSIS — J479 Bronchiectasis, uncomplicated: Secondary | ICD-10-CM | POA: Diagnosis not present

## 2019-05-13 DIAGNOSIS — C3490 Malignant neoplasm of unspecified part of unspecified bronchus or lung: Secondary | ICD-10-CM

## 2019-05-13 DIAGNOSIS — C3492 Malignant neoplasm of unspecified part of left bronchus or lung: Secondary | ICD-10-CM | POA: Diagnosis not present

## 2019-05-13 DIAGNOSIS — C50112 Malignant neoplasm of central portion of left female breast: Secondary | ICD-10-CM | POA: Diagnosis present

## 2019-05-13 DIAGNOSIS — R609 Edema, unspecified: Secondary | ICD-10-CM | POA: Diagnosis not present

## 2019-05-13 DIAGNOSIS — C50912 Malignant neoplasm of unspecified site of left female breast: Secondary | ICD-10-CM

## 2019-05-13 DIAGNOSIS — M8580 Other specified disorders of bone density and structure, unspecified site: Secondary | ICD-10-CM | POA: Diagnosis not present

## 2019-05-13 DIAGNOSIS — Z9049 Acquired absence of other specified parts of digestive tract: Secondary | ICD-10-CM | POA: Diagnosis not present

## 2019-05-13 DIAGNOSIS — R222 Localized swelling, mass and lump, trunk: Secondary | ICD-10-CM | POA: Diagnosis not present

## 2019-05-13 DIAGNOSIS — C773 Secondary and unspecified malignant neoplasm of axilla and upper limb lymph nodes: Secondary | ICD-10-CM | POA: Diagnosis present

## 2019-05-13 DIAGNOSIS — R7989 Other specified abnormal findings of blood chemistry: Secondary | ICD-10-CM

## 2019-05-13 DIAGNOSIS — R600 Localized edema: Secondary | ICD-10-CM

## 2019-05-13 DIAGNOSIS — Z7189 Other specified counseling: Secondary | ICD-10-CM

## 2019-05-13 DIAGNOSIS — K589 Irritable bowel syndrome without diarrhea: Secondary | ICD-10-CM | POA: Diagnosis not present

## 2019-05-13 DIAGNOSIS — Z833 Family history of diabetes mellitus: Secondary | ICD-10-CM | POA: Diagnosis not present

## 2019-05-13 DIAGNOSIS — I700 Atherosclerosis of aorta: Secondary | ICD-10-CM | POA: Diagnosis not present

## 2019-05-13 DIAGNOSIS — Z882 Allergy status to sulfonamides status: Secondary | ICD-10-CM | POA: Diagnosis not present

## 2019-05-13 DIAGNOSIS — N179 Acute kidney failure, unspecified: Secondary | ICD-10-CM | POA: Diagnosis not present

## 2019-05-13 DIAGNOSIS — Z8249 Family history of ischemic heart disease and other diseases of the circulatory system: Secondary | ICD-10-CM | POA: Diagnosis not present

## 2019-05-13 DIAGNOSIS — R296 Repeated falls: Secondary | ICD-10-CM | POA: Diagnosis not present

## 2019-05-13 DIAGNOSIS — Z79899 Other long term (current) drug therapy: Secondary | ICD-10-CM | POA: Diagnosis not present

## 2019-05-13 DIAGNOSIS — Z9181 History of falling: Secondary | ICD-10-CM

## 2019-05-13 DIAGNOSIS — Z83438 Family history of other disorder of lipoprotein metabolism and other lipidemia: Secondary | ICD-10-CM | POA: Diagnosis not present

## 2019-05-13 DIAGNOSIS — Z8719 Personal history of other diseases of the digestive system: Secondary | ICD-10-CM | POA: Diagnosis not present

## 2019-05-13 LAB — CBC WITH DIFFERENTIAL/PLATELET
Abs Immature Granulocytes: 0.04 10*3/uL (ref 0.00–0.07)
Basophils Absolute: 0 10*3/uL (ref 0.0–0.1)
Basophils Relative: 1 %
Eosinophils Absolute: 0.5 10*3/uL (ref 0.0–0.5)
Eosinophils Relative: 9 %
HCT: 32 % — ABNORMAL LOW (ref 36.0–46.0)
Hemoglobin: 10.2 g/dL — ABNORMAL LOW (ref 12.0–15.0)
Immature Granulocytes: 1 %
Lymphocytes Relative: 32 %
Lymphs Abs: 1.8 10*3/uL (ref 0.7–4.0)
MCH: 32.4 pg (ref 26.0–34.0)
MCHC: 31.9 g/dL (ref 30.0–36.0)
MCV: 101.6 fL — ABNORMAL HIGH (ref 80.0–100.0)
Monocytes Absolute: 1.4 10*3/uL — ABNORMAL HIGH (ref 0.1–1.0)
Monocytes Relative: 24 %
Neutro Abs: 1.8 10*3/uL (ref 1.7–7.7)
Neutrophils Relative %: 33 %
Platelets: 358 10*3/uL (ref 150–400)
RBC: 3.15 MIL/uL — ABNORMAL LOW (ref 3.87–5.11)
RDW: 13.7 % (ref 11.5–15.5)
WBC: 5.6 10*3/uL (ref 4.0–10.5)
nRBC: 0 % (ref 0.0–0.2)

## 2019-05-13 LAB — COMPREHENSIVE METABOLIC PANEL
ALT: 51 U/L — ABNORMAL HIGH (ref 0–44)
AST: 39 U/L (ref 15–41)
Albumin: 3.2 g/dL — ABNORMAL LOW (ref 3.5–5.0)
Alkaline Phosphatase: 150 U/L — ABNORMAL HIGH (ref 38–126)
Anion gap: 8 (ref 5–15)
BUN: 29 mg/dL — ABNORMAL HIGH (ref 8–23)
CO2: 28 mmol/L (ref 22–32)
Calcium: 8 mg/dL — ABNORMAL LOW (ref 8.9–10.3)
Chloride: 101 mmol/L (ref 98–111)
Creatinine, Ser: 1.29 mg/dL — ABNORMAL HIGH (ref 0.44–1.00)
GFR calc Af Amer: 45 mL/min — ABNORMAL LOW (ref >60)
GFR calc non Af Amer: 39 mL/min — ABNORMAL LOW (ref >60)
Glucose, Bld: 89 mg/dL (ref 70–99)
Potassium: 4.1 mmol/L (ref 3.5–5.1)
Sodium: 137 mmol/L (ref 135–145)
Total Bilirubin: 0.5 mg/dL (ref 0.3–1.2)
Total Protein: 6 g/dL — ABNORMAL LOW (ref 6.5–8.1)

## 2019-05-13 NOTE — Progress Notes (Addendum)
Hematology/Oncology follow up note [facility] Telephone:([phone] [phone]   Patient Care Team: [name], MD as PCP - General (Family Medicine) Osborne Oman, MD as Consulting Physician (Obstetrics and Gynecology) Pyrtle, Lajuan Lines, MD as Consulting Physician (Gastroenterology) Telford Nab, RN as Registered Nurse   REASON FOR VISIT:  Follow up for management of lung cancer and breast cancer  HISTORY OF PRESENTING ILLNESS:  Jamie Matthews is a  78 y.o.  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 05/05/2018 bilateral diagnostic breast mammogram showed suspicious mass 1.1cm  in the 12:00 retroareolar region of the left breast and the left axillary lymph node. 06/08/2018 patient status post a left breast retroareolar and left axillary lymph node biopsy. Pathology showed invasive mammary carcinoma, no special type, grade 1, left axillary lymph node positive for invasive mammary carcinoma clinically metastatic.  Background lymph node architecture is not identified. ER> 90% PR> 90%, HER-2 negative.  PET scan done which unfortunately showed additional hypermetabolic bilateral hilar lymph nodes as well as left upper lobe lung mass which may represent a focus of metastatic disease from breast, or primary lung neoplasm.  There is multiple additional small pulmonary nodules are scattered throughout both lungs which are worrisome for metastasis.  Index nodule within the medial right upper lobe measures 7 mm,, peri-broncho-vascular nodule in the right lower lobe measures 1.2 cm.  # Stage IV lung cancer-  Biopsy of lung mass left upper lobe showed non-small cell lung cancer, favor adenocarcinoma.  #NGS came back patient has PD L1 is 70% TPS, MET fusion mutation.  #Mid-March 2020 started on crizotinib  Bilateral lower extremity edema, right >left, Ultrasound venous right 09/20/2018 no DVT.  2D echo  10/10/2018 showed LVEF 55 to 60% Most likely secondary to crizotinib side effects.  # 12/15/2018 CT chest images were independently reviewed and discussed with patient. Dominant left upper lobe nodule and multiple scattered irregular pulmonary parenchymal nodular lesions are stable. Mild basilar pulmonary parenchymal septal thickening is new and can be seen with pulmonary edema. Patient reports no shortness of breath asymptomatic  #12/29/2018 unilateral left diagnostic mammogram with ultrasound images were independently reviewed and discussed with patient. Slight interval reduction of the size of the left retroareolar breast mass, 9 x 6 x 7 mm, comparing to 10 x 9 x 8 mm. Axillary lymph node measured 1.3 x 1.0 x 1.0 cm, previously 2.2 x 1.4 x 1.5 cm,  there were 2 other abdominal lymph nodes in the left axilla, which were not reported in previous ultrasound. Discussed with patient that overall, breast cancer responded to endocrine treatments.  Additional 2 lymph nodes need to be closely monitored.  Recommend patient to continue Arimidex.   INTERVAL HISTORY Jamie Matthews is a 78 y.o. female who has above history reviewed by me today presents for follow up visit for evaluation of tolerability of treatment for breast cancer and Stage IV lung cancer. #Patient has had another fall recently. During the interval she has had PET scan done present to discuss results. Patient has been on crizotinib, tolerates well.  Continue to have bilateral lower extremity edema.  She uses compression stocking. She is going to establish care with physical therapist. Patient takes Arimidex for breast cancer treatments.  Tolerates well. Insomnia, taking Ambien, still not able to sleep well. Patient informs me that she has not talked to her husband about her cancer diagnosis.  Review of Systems  Constitutional:  Positive for fatigue. Negative for appetite change, chills and fever.  HENT:   Negative for  hearing loss and voice change.   Eyes: Negative for eye problems.  Respiratory: Negative for chest tightness and cough.   Cardiovascular: Positive for leg swelling. Negative for chest pain.  Gastrointestinal: Negative for abdominal distention, abdominal pain and blood in stool.  Endocrine: Negative for hot flashes.  Genitourinary: Negative for difficulty urinating and frequency.   Musculoskeletal: Negative for arthralgias.  Skin: Negative for itching and rash.  Neurological: Negative for extremity weakness.       Had a fall recently  Hematological: Negative for adenopathy.  Psychiatric/Behavioral: Positive for sleep disturbance. Negative for confusion.    MEDICAL HISTORY:  Past Medical History:  Diagnosis Date  . AKI (acute kidney injury) ([place]) 08/17/2018  . Anxiety   . Arthritis   . Belching   . Bladder disorder    OVERACTIVE  . Bowel dysfunction    BLOCKAGE  . Cancer (HCC)    breast  . Constipation   . Depression   . Diverticulitis   . Fibromyalgia   . GERD (gastroesophageal reflux disease)   . Hyperlipidemia   . IBS (irritable bowel syndrome)   . Internal hemorrhoids   . Lung cancer ([place]) 2020  . Memory deficits   . Murmur    asymptomatic  . Pneumonia 11/18/12  . Urinary incontinence   . Vertigo     SURGICAL HISTORY: Past Surgical History:  Procedure Laterality Date  . AXILLARY LYMPH NODE BIOPSY Left 06/08/2018   INVASIVE MAMMARY CARCINOMA  . BLADDER SUSPENSION  2004, 2012  . BREAST BIOPSY Left 06/08/2018   INVASIVE MAMMARY CARCINOMA  . CATARACT EXTRACTION W/PHACO Right 08/27/2015   Procedure: CATARACT EXTRACTION PHACO AND INTRAOCULAR LENS PLACEMENT (IOC);  Surgeon: Estill Cotta, MD;  Location: [facility] ORS;  Service: Ophthalmology;  Laterality: Right;  [place]   1:00.2 AP%  22.5 CDE  23.67 fluid casette lot #[id number]  exp05/31/2018  . CATARACT EXTRACTION W/PHACO Left 10/15/2015   Procedure: CATARACT EXTRACTION PHACO AND INTRAOCULAR LENS PLACEMENT (IOC);  Surgeon:  Estill Cotta, MD;  Location: [facility] ORS;  Service: Ophthalmology;  Laterality: Left;  [place] 01:07 AP% 18.1 CDE 21.57 fluid pack lot # [id number]  . CHOLECYSTECTOMY    . COLONOSCOPY  2017  . ELECTROMAGNETIC NAVIGATION BROCHOSCOPY N/A 07/09/2018   Procedure: ELECTROMAGNETIC NAVIGATION BRONCHOSCOPY;  Surgeon: Flora Lipps, MD;  Location: [facility] ORS;  Service: Cardiopulmonary;  Laterality: N/A;  . TONSILLECTOMY  1947    SOCIAL HISTORY: Social History   Socioeconomic History  . Marital status: Married    Spouse name: Bennye Alm  . Number of children: 0  . Years of education: Master's Degree  . Highest education level: Not on file  Occupational History  . Occupation: Retired  Tobacco Use  . Smoking status: Never Smoker  . Smokeless tobacco: Never Used  Substance and Sexual Activity  . Alcohol use: Yes    Comment: rare wine  . Drug use: No  . Sexual activity: Not Currently  Other Topics Concern  . Not on file  Social History Narrative   Recently moved with her husband to [place] from [place].   Husband is a retired Pharmacist, community.   No children.   She is a retired Pharmacist, hospital.   Enjoys: Chief Technology Officer, reading - mysteries and biographies, cooking   Exercise: walking, gardening   Diet: low appetite due to cancer treatment, grazing   She is a DNR.   Social Determinants of Health  Financial Resource Strain: Low Risk   . Difficulty of Paying Living Expenses: Not hard at all  Food Insecurity:   . Worried About Charity fundraiser in the Last Year: Not on file  . Ran Out of Food in the Last Year: Not on file  Transportation Needs:   . Lack of Transportation (Medical): Not on file  . Lack of Transportation (Non-Medical): Not on file  Physical Activity:   . Days of Exercise per Week: Not on file  . Minutes of Exercise per Session: Not on file  Stress:   . Feeling of Stress : Not on file  Social Connections:   . Frequency of Communication with Friends and Family: Not on file  . Frequency  of Social Gatherings with Friends and Family: Not on file  . Attends Religious Services: Not on file  . Active Member of Clubs or Organizations: Not on file  . Attends Archivist Meetings: Not on file  . Marital Status: Not on file  Intimate Partner Violence:   . Fear of Current or Ex-Partner: Not on file  . Emotionally Abused: Not on file  . Physically Abused: Not on file  . Sexually Abused: Not on file    FAMILY HISTORY: Family History  Problem Relation Age of Onset  . Diabetes Father   . Heart disease Father   . Lymphoma Father   . Heart disease Mother   . Breast cancer Sister 34  . Colon cancer Neg Hx   . Esophageal cancer Neg Hx   . Rectal cancer Neg Hx   . Stomach cancer Neg Hx   . Bladder Cancer Neg Hx   . Kidney cancer Neg Hx     ALLERGIES:  is allergic to sulfa antibiotics.  MEDICATIONS:  Current Outpatient Medications  Medication Sig Dispense Refill  . acetaminophen (TYLENOL) 325 MG tablet Take 325 mg by mouth every 6 (six) hours as needed for mild pain.     [name] anastrozole (ARIMIDEX) 1 MG tablet TAKE 1 TABLET BY MOUTH DAILY 30 tablet 3  . atorvastatin (LIPITOR) 20 MG tablet Take 1 tablet (20 mg total) by mouth at bedtime. 90 tablet 3  . benzonatate (TESSALON) 100 MG capsule Take 1 capsule (100 mg total) by mouth 3 (three) times daily as needed for cough. 60 capsule 0  . buPROPion (WELLBUTRIN XL) 150 MG 24 hr tablet TAKE ONE TABLET BY MOUTH EVERY DAY 90 tablet 2  . desonide (DESOWEN) 0.05 % lotion Apply 1 application topically as needed (after showering).     . Dexlansoprazole (DEXILANT) 30 MG capsule Take 1 capsule (30 mg total) by mouth daily. 90 capsule 3  . Dextromethorphan-guaiFENesin (MUCINEX DM) 30-600 MG TB12 Take 1 tablet by mouth 2 (two) times daily as needed (for congestion/cough).    . docusate sodium (COLACE) 100 MG capsule Take 1 capsule (100 mg total) by mouth 2 (two) times daily. 60 capsule 0  . furosemide (LASIX) 20 MG tablet TAKE ONE  TABLET EVERY DAY AS NEEDED 30 tablet 0  . ketoconazole (NIZORAL) 2 % shampoo Apply 1 application topically. 3 times a week    . lubiprostone (AMITIZA) 24 MCG capsule TAKE ONE CAPSULE TWICE A DAY WITH MEALS 60 capsule 3  . memantine (NAMENDA) 10 MG tablet TAKE ONE TABLET BY MOUTH TWICE DAILY 180 tablet 2  . Multiple Vitamin (MULTIVITAMIN WITH MINERALS) TABS tablet Take 1 tablet by mouth daily.    [name] MYRBETRIQ 50 MG TB24 tablet TAKE ONE TABLET BY  MOUTH EVERY DAY 90 tablet 1  . ondansetron (ZOFRAN) 8 MG tablet Take 8 mg by mouth every 8 (eight) hours as needed for nausea.    . polyethylene glycol (MIRALAX / GLYCOLAX) packet Take 17 g by mouth daily.    [name] senna (SENOKOT) 8.6 MG TABS tablet Take 2 tablets (17.2 mg total) by mouth daily. Stop Colace 60 tablet 0  . triamcinolone (NASACORT) 55 MCG/ACT AERO nasal inhaler Place 2 sprays into the nose daily as needed (for congestion).     . venlafaxine XR (EFFEXOR-XR) 150 MG 24 hr capsule TAKE 1 CAPSULE BY MOUTH DAILY 90 capsule 1  . XALKORI 250 MG capsule TAKE 1 CAPSULE (250 MG TOTAL) BY MOUTH 2 TIMES DAILY. 60 capsule 2  . zolpidem (AMBIEN) 5 MG tablet TAKE ONE TABLET AT BEDTIME AS NEEDED FORSLEEP 30 tablet 0  . Fesoterodine Fumarate (TOVIAZ PO) Take 1 tablet by mouth daily.    . fluticasone (FLONASE) 50 MCG/ACT nasal spray Place 2 sprays into both nostrils daily. (Patient not taking: Reported on 05/13/2019) 16 g 6  . sucralfate (CARAFATE) 1 g tablet Take 1 tablet (1 g total) by mouth 2 (two) times daily. (Patient not taking: Reported on 05/13/2019) 60 tablet 0   No current facility-administered medications for this visit.     PHYSICAL EXAMINATION: ECOG PERFORMANCE STATUS: 2 - Symptomatic, <50% confined to bed Vitals:   05/13/19 1409  BP: 140/73  Pulse: (!) 49  Resp: 16  Temp: (!) 96.6 F (35.9 C)   Filed Weights   05/13/19 1409  Weight: 150 lb (68 kg)    Physical Exam  Constitutional: She is oriented to person, place, and time. No  distress.  Frail female, walks with a walker  HENT:  Head: Normocephalic and atraumatic.  Mouth/Throat: No oropharyngeal exudate.  Eyes: Pupils are equal, round, and reactive to light. EOM are normal. No scleral icterus.  Cardiovascular: Normal rate and regular rhythm.  No murmur heard. Pulmonary/Chest: Effort normal. No respiratory distress. She has no wheezes.  Abdominal: Soft. She exhibits no distension. There is no abdominal tenderness.  Musculoskeletal:        General: Edema present. Normal range of motion.     Cervical back: Normal range of motion and neck supple.     Comments: Bilateral lower extremity 3+ edema, right worse than left.   Neurological: She is alert and oriented to person, place, and time. No cranial nerve deficit.  Skin: Skin is warm and dry. She is not diaphoretic. No erythema.  Psychiatric: Affect normal.     CMP Latest Ref Rng & Units 05/13/2019  Glucose 70 - 99 mg/dL 89  BUN 8 - 23 mg/dL 29(H)  Creatinine 0.44 - 1.00 mg/dL 1.29(H)  Sodium 135 - 145 mmol/L 137  Potassium 3.5 - 5.1 mmol/L 4.1  Chloride 98 - 111 mmol/L 101  CO2 22 - 32 mmol/L 28  Calcium 8.9 - 10.3 mg/dL 8.0(L)  Total Protein 6.5 - 8.1 g/dL 6.0(L)  Total Bilirubin 0.3 - 1.2 mg/dL 0.5  Alkaline Phos 38 - 126 U/L 150(H)  AST 15 - 41 U/L 39  ALT 0 - 44 U/L 51(H)   CBC Latest Ref Rng & Units 05/13/2019  WBC 4.0 - 10.5 K/uL 5.6  Hemoglobin 12.0 - 15.0 g/dL 10.2(L)  Hematocrit 36.0 - 46.0 % 32.0(L)  Platelets 150 - 400 K/uL 358    LABORATORY DATA:  I have reviewed the data as listed Lab Results  Component Value Date   WBC  5.6 05/13/2019   HGB 10.2 (L) 05/13/2019   HCT 32.0 (L) 05/13/2019   MCV 101.6 (H) 05/13/2019   PLT 358 05/13/2019   Recent Labs    03/15/19 1310 04/15/19 0848 05/13/19 1303  NA 139 136 137  K 3.9 4.6 4.1  CL 102 102 101  CO2 '29 28 28  ' GLUCOSE 94 135* 89  BUN 26* 35* 29*  CREATININE 1.34* 1.44* 1.29*  CALCIUM 7.9* 8.3* 8.0*  GFRNONAA 37* 34* 39*    GFRAA 43* 40* 45*  PROT 6.5 6.1* 6.0*  ALBUMIN 3.4* 3.3* 3.2*  AST 30 38 39  ALT 33 41 51*  ALKPHOS 112 113 150*  BILITOT 0.6 0.7 0.5   Iron/TIBC/Ferritin/ %Sat No results found for: IRON, TIBC, FERRITIN, IRONPCTSAT   RADIOGRAPHIC STUDIES: I have personally reviewed the radiological images as listed and agreed with the findings in the report. CT CHEST WO CONTRAST  Result Date: 03/23/2019 CLINICAL DATA:  Lung cancer, breast cancer. EXAM: CT CHEST WITHOUT CONTRAST TECHNIQUE: Multidetector CT imaging of the chest was performed following the standard protocol without IV contrast. COMPARISON:  12/15/2018. FINDINGS: Cardiovascular: Atherosclerotic calcification of the aorta, aortic valve and coronary arteries. Heart size normal. No pericardial effusion. Mediastinum/Nodes: Mediastinal lymph nodes measure up to 10 mm in the low right paratracheal station, as before. Calcified left hilar lymph nodes. Hilar regions are otherwise difficult to evaluate without IV contrast. Left axillary lymph node measures 10 mm, stable. No right axillary or internal mammary adenopathy. Esophagus is unremarkable. Lungs/Pleura: Dominant nodule in the apical left upper lobe measures 1.8 x 2.1 cm (3/25), stable. Associated bronchiectasis and surrounding parenchymal retraction. Calcified granulomas. Additional scattered pulmonary nodules are unchanged. Very mild basilar septal thickening, left greater than right, largely new from the prior exam. No pleural fluid. Airway is unremarkable. Upper Abdomen: Visualized portions of the liver, adrenal glands, kidneys, spleen, pancreas, stomach and bowel are grossly unremarkable. Musculoskeletal: T11 compression deformity is seen with a new area of lucency in the superior endplate (4/85). IMPRESSION: 1. Dominant left upper lobe nodule and multiple scattered additional pulmonary nodular densities are stable. 2. Mild bilateral lower lobe septal thickening, left greater than right, largely new  from 12/15/2018. Edema is considered unlikely given normal heart size. Continued attention on follow-up exams is warranted as lymphangitic carcinomatosis cannot be excluded. 3. T11 compression fracture with new lucency in the superior endplate. Difficult to exclude metastatic disease. 4. Aortic atherosclerosis (ICD10-170.0). Coronary artery calcification. Electronically Signed   By: Lorin Picket M.D.   On: 03/23/2019 09:19   MR BRAIN W WO CONTRAST  Result Date: 03/23/2019 CLINICAL DATA:  Breast and lung cancer.  Restaging. EXAM: MRI HEAD WITHOUT AND WITH CONTRAST TECHNIQUE: Multiplanar, multiecho pulse sequences of the brain and surrounding structures were obtained without and with intravenous contrast. CONTRAST:  64m GADAVIST GADOBUTROL 1 MMOL/ML IV SOLN COMPARISON:  07/20/2018 FINDINGS: Brain: Diffusion imaging does not show any acute or subacute infarction or other cause of restricted diffusion. There chronic small-vessel ischemic changes of the pons. No focal cerebellar insult. Cerebral hemispheres show pronounced generalized atrophy with moderate chronic small-vessel ischemic changes of the white matter. No cortical or large vessel territory infarction. No evidence of mass lesion, hemorrhage, hydrocephalus or extra-axial collection. Vascular: Major vessels at the base of the brain show flow. Skull and upper cervical spine: Negative Sinuses/Orbits: Clear/normal Other: None IMPRESSION: No change. No evidence of metastatic disease. Atrophy and chronic small-vessel ischemic changes. Electronically Signed   By: MJan FiremanD.  On: 03/23/2019 10:46   NM PET Image Restag (PS) Skull Base To Thigh  Result Date: 05/10/2019 CLINICAL DATA:  Subsequent treatment strategy for lung cancer, breast cancer. Fall 2 weeks ago. EXAM: NUCLEAR MEDICINE PET SKULL BASE TO THIGH TECHNIQUE: 8.0 mCi F-18 FDG was injected intravenously. Full-ring PET imaging was performed from the skull base to thigh after the radiotracer.  CT data was obtained and used for attenuation correction and anatomic localization. Fasting blood glucose: 94 mg/dl COMPARISON:  CT chest dated 03/23/2019.  PET-CT dated 06/21/2018. FINDINGS: Mediastinal blood pool activity: SUV max 2.3 Liver activity: SUV max NA NECK: No hypermetabolic cervical lymphadenopathy. Incidental CT findings: none CHEST: 1.6 x 2.1 cm left apical nodule (series 3/image 34), max SUV 2.5. This previously measured 1.8 x 2.1 cm on recent CT and demonstrated max SUV 7.4 on prior PET. Improving small bilateral pulmonary nodules, including a dominant 3 mm nodule in the medial right upper lobe, now FDG avid. This previously measured 5 mm and demonstrated mild hypermetabolism. Persistent hypermetabolism in the bilateral perihilar regions, including max SUV 4.5 in the left suprahilar region, which may correspond to a 9 mm AP window node (series 3/image 98). On prior PET, max SUV in this region was 11.1. Mild residual hypermetabolism in the right infrahilar region/intralobar right lower lobe, max SUV 3.3, previously 10.4. Very mild sub areolar soft tissue nodularity (series 3/image 37), although without residual hypermetabolism. 9 mm short axis left axillary node (series 3/image 80), previously 10 mm on recent CT, without residual hypermetabolism (max SUV 1.0), previously max SUV 5.6 on prior PET. Incidental CT findings: Mild atherosclerotic calcifications of the aortic arch. Three vessel coronary atherosclerosis. ABDOMEN/PELVIS: No hypermetabolic abdominopelvic lymphadenopathy. No abnormal hypermetabolism in the liver, spleen, pancreas, or adrenal glands. Incidental CT findings: Status post cholecystectomy. Atherosclerotic calcifications the abdominal aorta and branch vessels. Thick-walled bladder. Moderate colonic stool burden, suggesting constipation. SKELETON: No focal hypermetabolic activity to suggest skeletal metastasis. Incidental CT findings: Nondisplaced right lateral 8th and 9th rib  fractures (series 3/images 142 and 149), max SUV 3.0, likely posttraumatic. Healing right superior and inferior pubic rami fractures (for example, series 3/image 244), non FDG avid. IMPRESSION: Improving left apical nodule, corresponding to the patient's known primary bronchogenic neoplasm. Near complete resolution of known pulmonary metastases. Improving hilar/perihilar nodal metastases. Prior subareolar left breast mass has essentially resolved. Small left axillary nodal metastasis, improved, although non FDG avid. Nondisplaced right lateral 8th and 9th rib fractures, likely related to recent fall. Healing right superior and inferior pubic rami fractures, old. Electronically Signed   By: Julian Hy M.D.   On: 05/10/2019 14:17      ASSESSMENT & PLAN:  1. Primary adenocarcinoma of left lung ([place])   2. Malignant neoplasm of left female breast, unspecified estrogen receptor status, unspecified site of breast ([place])   3. Encounter for antineoplastic chemotherapy   4. Aromatase inhibitor use   5. Osteopenia, unspecified location   6. At risk for falls   7. Lower leg edema   Cancer Staging Malignant neoplasm of left female breast [facility]) Staging form: Breast, AJCC 8th Edition - Clinical stage from 12/17/2018: Stage IB (cT1b, cN1, cM0, G1, ER+, PR+, HER2-) - Signed by Earlie Server, MD on 12/17/2018  Primary adenocarcinoma of lung [facility]) Staging form: Lung, AJCC 8th Edition - Clinical stage from 09/28/2018: Stage IVA (cT2, cN3, cM1a) - Signed by Earlie Server, MD on 09/28/2018   Patient has 2 primaries.   Stage IV lung adenocarcinoma, met fusion mutation cT2  N3 M1a PDL 1 TPS more than 70%  Patient clinically tolerating crizotinib well. Labs are reviewed and discussed with patient. Interval PET scan was independent reviewed by me and discussed with patient.  Good response. Continue crizotinib.  #Breast cancer, responding to Arimidex.  Continue. #Transaminitis, possibly secondary to crizotinib use.   Continue to monitor. #Frequent falls, establish care with physical therapy. #Osteopenia Discussed at length the risk factors [race, post-menopausal status, use of AI]; prevention/treatment strategies- including but not limited to exercise calcium and vitamin D twice a day. Discussed re: Oral biphosphonates; and side effects.   Recommend subcu Prolia Discussed the potential side effects including but not limited to- hypocalcemia and ONJ; discussed not to have invasive dental procedures few weeks around the time of the injections.  Awaiting dental clearance. Continue calcium and vitamin D supplementation. #Insomnia, ambien. 68m QHS not helping her symptoms.  Recommend to increase to 166mQHS Follow-up with palliative care. All questions were answered. The patient knows to call the clinic with any problems questions or concerns. Return of visit: 4 weeks.  Orders Placed This Encounter  Procedures  . CBC with Differential    Standing Status:   Future    Standing Expiration Date:   05/12/2020  . Comprehensive metabolic panel    Standing Status:   Future    Standing Expiration Date:   05/12/2020     ZhEarlie ServerMD, PhD 05/13/2019

## 2019-05-13 NOTE — Telephone Encounter (Signed)
Patient states that these past few days she has not been able to sleep well. She takes Ambien, which helped at first, but now "it's not doing anything." Pt would like to know if there is anything she can do different?

## 2019-05-13 NOTE — Progress Notes (Signed)
Patient is having trouble sleeping and Ambien does not help.

## 2019-05-14 NOTE — Telephone Encounter (Signed)
She may try to take 10mg  Ambien QHS.

## 2019-05-16 ENCOUNTER — Telehealth: Payer: Self-pay | Admitting: *Deleted

## 2019-05-16 NOTE — Telephone Encounter (Signed)
Per Dr. Tasia Catchings, there is not enough data to suggest the COVID vaccine is safe or unsafe in her condition/diagnosis.

## 2019-05-16 NOTE — Telephone Encounter (Signed)
Patient called reporting that the facility she lives in is going to be giving COVID vaccines to the residents and is asking if it is OK for her to take it. Please advise

## 2019-05-16 NOTE — Telephone Encounter (Signed)
Call returned to patient and advised of doctor response, she stated "then I won't take it"

## 2019-05-16 NOTE — Telephone Encounter (Signed)
Patient notified and voiced understanding.

## 2019-05-17 MED FILL — XALKORI 250 MG CAPSULE: 250 | 30 days supply | Qty: 60 | Fill #2

## 2019-05-24 ENCOUNTER — Other Ambulatory Visit: Payer: Self-pay | Admitting: Oncology

## 2019-05-24 DIAGNOSIS — C3490 Malignant neoplasm of unspecified part of unspecified bronchus or lung: Secondary | ICD-10-CM

## 2019-05-25 ENCOUNTER — Other Ambulatory Visit: Payer: Self-pay | Admitting: Oncology

## 2019-05-30 ENCOUNTER — Telehealth: Payer: Self-pay | Admitting: *Deleted

## 2019-05-30 NOTE — Telephone Encounter (Signed)
Patient called stating that her dentist needs a request form [place] for her dental clearance for bisphosphonate therapy sent to him. His fax number is [phone], office number is [phone]

## 2019-05-31 NOTE — Telephone Encounter (Signed)
Dental clearance request letter faxed to dentist office.

## 2019-06-02 ENCOUNTER — Inpatient Hospital Stay: Payer: Medicare Other | Attending: Hospice and Palliative Medicine | Admitting: Hospice and Palliative Medicine

## 2019-06-02 DIAGNOSIS — I6782 Cerebral ischemia: Secondary | ICD-10-CM | POA: Insufficient documentation

## 2019-06-02 DIAGNOSIS — Z8249 Family history of ischemic heart disease and other diseases of the circulatory system: Secondary | ICD-10-CM | POA: Insufficient documentation

## 2019-06-02 DIAGNOSIS — M8580 Other specified disorders of bone density and structure, unspecified site: Secondary | ICD-10-CM | POA: Insufficient documentation

## 2019-06-02 DIAGNOSIS — M1990 Unspecified osteoarthritis, unspecified site: Secondary | ICD-10-CM | POA: Insufficient documentation

## 2019-06-02 DIAGNOSIS — Z79899 Other long term (current) drug therapy: Secondary | ICD-10-CM | POA: Insufficient documentation

## 2019-06-02 DIAGNOSIS — C3492 Malignant neoplasm of unspecified part of left bronchus or lung: Secondary | ICD-10-CM

## 2019-06-02 DIAGNOSIS — Z803 Family history of malignant neoplasm of breast: Secondary | ICD-10-CM | POA: Insufficient documentation

## 2019-06-02 DIAGNOSIS — G4700 Insomnia, unspecified: Secondary | ICD-10-CM | POA: Insufficient documentation

## 2019-06-02 DIAGNOSIS — K589 Irritable bowel syndrome without diarrhea: Secondary | ICD-10-CM | POA: Insufficient documentation

## 2019-06-02 DIAGNOSIS — Z171 Estrogen receptor negative status [ER-]: Secondary | ICD-10-CM | POA: Insufficient documentation

## 2019-06-02 DIAGNOSIS — Z83438 Family history of other disorder of lipoprotein metabolism and other lipidemia: Secondary | ICD-10-CM | POA: Insufficient documentation

## 2019-06-02 DIAGNOSIS — C50012 Malignant neoplasm of nipple and areola, left female breast: Secondary | ICD-10-CM | POA: Insufficient documentation

## 2019-06-02 DIAGNOSIS — C3412 Malignant neoplasm of upper lobe, left bronchus or lung: Secondary | ICD-10-CM | POA: Insufficient documentation

## 2019-06-02 DIAGNOSIS — K219 Gastro-esophageal reflux disease without esophagitis: Secondary | ICD-10-CM | POA: Insufficient documentation

## 2019-06-02 DIAGNOSIS — Z833 Family history of diabetes mellitus: Secondary | ICD-10-CM | POA: Insufficient documentation

## 2019-06-02 DIAGNOSIS — R296 Repeated falls: Secondary | ICD-10-CM | POA: Insufficient documentation

## 2019-06-02 DIAGNOSIS — N189 Chronic kidney disease, unspecified: Secondary | ICD-10-CM | POA: Insufficient documentation

## 2019-06-02 DIAGNOSIS — Z515 Encounter for palliative care: Secondary | ICD-10-CM

## 2019-06-02 DIAGNOSIS — S2241XA Multiple fractures of ribs, right side, initial encounter for closed fracture: Secondary | ICD-10-CM | POA: Insufficient documentation

## 2019-06-02 DIAGNOSIS — R5383 Other fatigue: Secondary | ICD-10-CM | POA: Insufficient documentation

## 2019-06-02 DIAGNOSIS — Z79811 Long term (current) use of aromatase inhibitors: Secondary | ICD-10-CM | POA: Insufficient documentation

## 2019-06-02 DIAGNOSIS — Z882 Allergy status to sulfonamides status: Secondary | ICD-10-CM | POA: Insufficient documentation

## 2019-06-02 DIAGNOSIS — I700 Atherosclerosis of aorta: Secondary | ICD-10-CM | POA: Insufficient documentation

## 2019-06-02 DIAGNOSIS — R600 Localized edema: Secondary | ICD-10-CM | POA: Insufficient documentation

## 2019-06-02 DIAGNOSIS — Z9049 Acquired absence of other specified parts of digestive tract: Secondary | ICD-10-CM | POA: Insufficient documentation

## 2019-06-02 DIAGNOSIS — J479 Bronchiectasis, uncomplicated: Secondary | ICD-10-CM | POA: Insufficient documentation

## 2019-06-02 DIAGNOSIS — Z8719 Personal history of other diseases of the digestive system: Secondary | ICD-10-CM | POA: Insufficient documentation

## 2019-06-02 NOTE — Progress Notes (Signed)
Was unable to reach patient as scheduled time of telephone visit.  Voicemail left.  Will reschedule.

## 2019-06-03 ENCOUNTER — Encounter: Payer: Self-pay | Admitting: *Deleted

## 2019-06-09 MED FILL — XALKORI 250 MG CAPSULE: 250 | 30 days supply | Qty: 60 | Fill #0

## 2019-06-10 ENCOUNTER — Other Ambulatory Visit: Payer: Self-pay

## 2019-06-10 ENCOUNTER — Inpatient Hospital Stay: Payer: Medicare Other

## 2019-06-10 ENCOUNTER — Encounter: Payer: Self-pay | Admitting: Oncology

## 2019-06-10 ENCOUNTER — Inpatient Hospital Stay (HOSPITAL_BASED_OUTPATIENT_CLINIC_OR_DEPARTMENT_OTHER): Payer: Medicare Other | Admitting: Oncology

## 2019-06-10 VITALS — BP 106/61 | HR 56 | Temp 96.5°F | Wt 136.7 lb

## 2019-06-10 DIAGNOSIS — R5383 Other fatigue: Secondary | ICD-10-CM | POA: Diagnosis not present

## 2019-06-10 DIAGNOSIS — Z5111 Encounter for antineoplastic chemotherapy: Secondary | ICD-10-CM

## 2019-06-10 DIAGNOSIS — N189 Chronic kidney disease, unspecified: Secondary | ICD-10-CM | POA: Diagnosis not present

## 2019-06-10 DIAGNOSIS — Z171 Estrogen receptor negative status [ER-]: Secondary | ICD-10-CM | POA: Diagnosis not present

## 2019-06-10 DIAGNOSIS — I700 Atherosclerosis of aorta: Secondary | ICD-10-CM | POA: Diagnosis not present

## 2019-06-10 DIAGNOSIS — M1990 Unspecified osteoarthritis, unspecified site: Secondary | ICD-10-CM | POA: Diagnosis not present

## 2019-06-10 DIAGNOSIS — M8580 Other specified disorders of bone density and structure, unspecified site: Secondary | ICD-10-CM | POA: Diagnosis not present

## 2019-06-10 DIAGNOSIS — K219 Gastro-esophageal reflux disease without esophagitis: Secondary | ICD-10-CM | POA: Diagnosis not present

## 2019-06-10 DIAGNOSIS — C3412 Malignant neoplasm of upper lobe, left bronchus or lung: Secondary | ICD-10-CM | POA: Diagnosis not present

## 2019-06-10 DIAGNOSIS — J479 Bronchiectasis, uncomplicated: Secondary | ICD-10-CM | POA: Diagnosis not present

## 2019-06-10 DIAGNOSIS — Z882 Allergy status to sulfonamides status: Secondary | ICD-10-CM | POA: Diagnosis not present

## 2019-06-10 DIAGNOSIS — Z79899 Other long term (current) drug therapy: Secondary | ICD-10-CM | POA: Diagnosis not present

## 2019-06-10 DIAGNOSIS — Z79811 Long term (current) use of aromatase inhibitors: Secondary | ICD-10-CM | POA: Diagnosis not present

## 2019-06-10 DIAGNOSIS — R600 Localized edema: Secondary | ICD-10-CM | POA: Diagnosis not present

## 2019-06-10 DIAGNOSIS — C50012 Malignant neoplasm of nipple and areola, left female breast: Secondary | ICD-10-CM | POA: Diagnosis not present

## 2019-06-10 DIAGNOSIS — Z9049 Acquired absence of other specified parts of digestive tract: Secondary | ICD-10-CM | POA: Diagnosis not present

## 2019-06-10 DIAGNOSIS — Z8249 Family history of ischemic heart disease and other diseases of the circulatory system: Secondary | ICD-10-CM | POA: Diagnosis not present

## 2019-06-10 DIAGNOSIS — I6782 Cerebral ischemia: Secondary | ICD-10-CM | POA: Diagnosis not present

## 2019-06-10 DIAGNOSIS — G4700 Insomnia, unspecified: Secondary | ICD-10-CM | POA: Diagnosis not present

## 2019-06-10 DIAGNOSIS — C50912 Malignant neoplasm of unspecified site of left female breast: Secondary | ICD-10-CM | POA: Diagnosis not present

## 2019-06-10 DIAGNOSIS — Z803 Family history of malignant neoplasm of breast: Secondary | ICD-10-CM | POA: Diagnosis not present

## 2019-06-10 DIAGNOSIS — R296 Repeated falls: Secondary | ICD-10-CM | POA: Diagnosis not present

## 2019-06-10 DIAGNOSIS — C3492 Malignant neoplasm of unspecified part of left bronchus or lung: Secondary | ICD-10-CM | POA: Diagnosis not present

## 2019-06-10 DIAGNOSIS — S2241XA Multiple fractures of ribs, right side, initial encounter for closed fracture: Secondary | ICD-10-CM | POA: Diagnosis not present

## 2019-06-10 DIAGNOSIS — Z8719 Personal history of other diseases of the digestive system: Secondary | ICD-10-CM | POA: Diagnosis not present

## 2019-06-10 DIAGNOSIS — K589 Irritable bowel syndrome without diarrhea: Secondary | ICD-10-CM | POA: Diagnosis not present

## 2019-06-10 DIAGNOSIS — Z833 Family history of diabetes mellitus: Secondary | ICD-10-CM | POA: Diagnosis not present

## 2019-06-10 LAB — COMPREHENSIVE METABOLIC PANEL
ALT: 34 U/L (ref 0–44)
AST: 32 U/L (ref 15–41)
Albumin: 3.1 g/dL — ABNORMAL LOW (ref 3.5–5.0)
Alkaline Phosphatase: 120 U/L (ref 38–126)
Anion gap: 9 (ref 5–15)
BUN: 22 mg/dL (ref 8–23)
CO2: 28 mmol/L (ref 22–32)
Calcium: 8.2 mg/dL — ABNORMAL LOW (ref 8.9–10.3)
Chloride: 100 mmol/L (ref 98–111)
Creatinine, Ser: 1.22 mg/dL — ABNORMAL HIGH (ref 0.44–1.00)
GFR calc Af Amer: 48 mL/min — ABNORMAL LOW (ref >60)
GFR calc non Af Amer: 42 mL/min — ABNORMAL LOW (ref >60)
Glucose, Bld: 105 mg/dL — ABNORMAL HIGH (ref 70–99)
Potassium: 3.9 mmol/L (ref 3.5–5.1)
Sodium: 137 mmol/L (ref 135–145)
Total Bilirubin: 0.5 mg/dL (ref 0.3–1.2)
Total Protein: 6.2 g/dL — ABNORMAL LOW (ref 6.5–8.1)

## 2019-06-10 LAB — CBC WITH DIFFERENTIAL/PLATELET
Abs Immature Granulocytes: 0.04 10*3/uL (ref 0.00–0.07)
Basophils Absolute: 0 10*3/uL (ref 0.0–0.1)
Basophils Relative: 0 %
Eosinophils Absolute: 0.3 10*3/uL (ref 0.0–0.5)
Eosinophils Relative: 6 %
HCT: 35.7 % — ABNORMAL LOW (ref 36.0–46.0)
Hemoglobin: 11.3 g/dL — ABNORMAL LOW (ref 12.0–15.0)
Immature Granulocytes: 1 %
Lymphocytes Relative: 29 %
Lymphs Abs: 1.7 10*3/uL (ref 0.7–4.0)
MCH: 31.6 pg (ref 26.0–34.0)
MCHC: 31.7 g/dL (ref 30.0–36.0)
MCV: 99.7 fL (ref 80.0–100.0)
Monocytes Absolute: 1.1 10*3/uL — ABNORMAL HIGH (ref 0.1–1.0)
Monocytes Relative: 20 %
Neutro Abs: 2.6 10*3/uL (ref 1.7–7.7)
Neutrophils Relative %: 44 %
Platelets: 311 10*3/uL (ref 150–400)
RBC: 3.58 MIL/uL — ABNORMAL LOW (ref 3.87–5.11)
RDW: 13.2 % (ref 11.5–15.5)
WBC: 5.8 10*3/uL (ref 4.0–10.5)
nRBC: 0 % (ref 0.0–0.2)

## 2019-06-10 NOTE — Progress Notes (Addendum)
Hematology/Oncology follow up note [facility] Telephone:([phone]) [phone] [phone]   Patient Care Team: [name], MD as PCP - General (Family Medicine) Osborne Oman, MD as Consulting Physician (Obstetrics and Gynecology) Pyrtle, Lajuan Lines, MD as Consulting Physician (Gastroenterology) Telford Nab, RN as Registered Nurse   REASON FOR VISIT:  Follow up for management of lung cancer and breast cancer  HISTORY OF PRESENTING ILLNESS:  Jamie Matthews is a  79 y.o.  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 05/05/2018 bilateral diagnostic breast mammogram showed suspicious mass 1.1cm  in the 12:00 retroareolar region of the left breast and the left axillary lymph node. 06/08/2018 patient status post a left breast retroareolar and left axillary lymph node biopsy. Pathology showed invasive mammary carcinoma, no special type, grade 1, left axillary lymph node positive for invasive mammary carcinoma clinically metastatic.  Background lymph node architecture is not identified. ER> 90% PR> 90%, HER-2 negative.  PET scan done which unfortunately showed additional hypermetabolic bilateral hilar lymph nodes as well as left upper lobe lung mass which may represent a focus of metastatic disease from breast, or primary lung neoplasm.  There is multiple additional small pulmonary nodules are scattered throughout both lungs which are worrisome for metastasis.  Index nodule within the medial right upper lobe measures 7 mm,, peri-broncho-vascular nodule in the right lower lobe measures 1.2 cm.  # Stage IV lung cancer-  Biopsy of lung mass left upper lobe showed non-small cell lung cancer, favor adenocarcinoma.  #NGS came back patient has PD L1 is 70% TPS, MET fusion mutation.  #Mid-March 2020 started on crizotinib  Bilateral lower extremity edema, right >left, Ultrasound venous right 09/20/2018 no DVT.  2D echo  10/10/2018 showed LVEF 55 to 60% Most likely secondary to crizotinib side effects.  # 12/15/2018 CT chest images were independently reviewed and discussed with patient. Dominant left upper lobe nodule and multiple scattered irregular pulmonary parenchymal nodular lesions are stable. Mild basilar pulmonary parenchymal septal thickening is new and can be seen with pulmonary edema. Patient reports no shortness of breath asymptomatic  #12/29/2018 unilateral left diagnostic mammogram with ultrasound images were independently reviewed and discussed with patient. Slight interval reduction of the size of the left retroareolar breast mass, 9 x 6 x 7 mm, comparing to 10 x 9 x 8 mm. Axillary lymph node measured 1.3 x 1.0 x 1.0 cm, previously 2.2 x 1.4 x 1.5 cm,  there were 2 other abdominal lymph nodes in the left axilla, which were not reported in previous ultrasound. Discussed with patient that overall, breast cancer responded to endocrine treatments.  Additional 2 lymph nodes need to be closely monitored.  Recommend patient to continue Arimidex.   INTERVAL HISTORY Jamie Matthews is a 79 y.o. female who has above history reviewed by me today presents for follow up visit for evaluation of tolerability of treatment for breast cancer and Stage IV lung cancer. Patient had PET scan done during interval.  Present to discuss results. Patient has been on crizotinib, tolerating well.  Continues to have lower extremity edema.  She uses compression stocking .  She takes Arimidex tolerates well.  No new complaints.   Review of Systems  Constitutional: Positive for fatigue. Negative for appetite change, chills and fever.  HENT:   Negative for hearing loss and voice change.   Eyes: Negative for eye problems.  Respiratory: Negative for chest tightness and cough.   Cardiovascular:  Positive for leg swelling. Negative for chest pain.  Gastrointestinal: Negative for abdominal distention, abdominal pain and  blood in stool.  Endocrine: Negative for hot flashes.  Genitourinary: Negative for difficulty urinating and frequency.   Musculoskeletal: Negative for arthralgias.  Skin: Negative for itching and rash.  Neurological: Negative for extremity weakness.       Had a fall recently  Hematological: Negative for adenopathy.  Psychiatric/Behavioral: Positive for sleep disturbance. Negative for confusion.    MEDICAL HISTORY:  Past Medical History:  Diagnosis Date   AKI (acute kidney injury) ([place]) 08/17/2018   Anxiety    Arthritis    Belching    Bladder disorder    OVERACTIVE   Bowel dysfunction    BLOCKAGE   Cancer (HCC)    breast   Constipation    Depression    Diverticulitis    Fibromyalgia    GERD (gastroesophageal reflux disease)    Hyperlipidemia    IBS (irritable bowel syndrome)    Internal hemorrhoids    Lung cancer ([place]) 2020   Memory deficits    Murmur    asymptomatic   Pneumonia 11/18/12   Urinary incontinence    Vertigo     SURGICAL HISTORY: Past Surgical History:  Procedure Laterality Date   AXILLARY LYMPH NODE BIOPSY Left 06/08/2018   INVASIVE MAMMARY CARCINOMA   BLADDER SUSPENSION  2004, 2012   BREAST BIOPSY Left 06/08/2018   INVASIVE MAMMARY CARCINOMA   CATARACT EXTRACTION W/PHACO Right 08/27/2015   Procedure: CATARACT EXTRACTION PHACO AND INTRAOCULAR LENS PLACEMENT ([place]);  Surgeon: Estill Cotta, MD;  Location: [facility] ORS;  Service: Ophthalmology;  Laterality: Right;  [place]   1:00.2 AP%  22.5 CDE  23.67 fluid casette lot #[id number]  exp05/31/2018   CATARACT EXTRACTION W/PHACO Left 10/15/2015   Procedure: CATARACT EXTRACTION PHACO AND INTRAOCULAR LENS PLACEMENT ([facility]);  Surgeon: Estill Cotta, MD;  Location: [facility] ORS;  Service: Ophthalmology;  Laterality: Left;  [place] 01:07 AP% 18.1 CDE 21.57 fluid pack lot # [id number]   CHOLECYSTECTOMY     COLONOSCOPY  2017   ELECTROMAGNETIC NAVIGATION BROCHOSCOPY N/A 07/09/2018   Procedure:  ELECTROMAGNETIC NAVIGATION BRONCHOSCOPY;  Surgeon: Flora Lipps, MD;  Location: [facility] ORS;  Service: Cardiopulmonary;  Laterality: N/A;   TONSILLECTOMY  1947    SOCIAL HISTORY: Social History   Socioeconomic History   Marital status: Married    Spouse name: Scientist, forensic   Number of children: 0   Years of education: Master's Degree   Highest education level: Not on file  Occupational History   Occupation: Retired  Tobacco Use   Smoking status: Never Smoker   Smokeless tobacco: Never Used  Substance and Sexual Activity   Alcohol use: Yes    Comment: rare wine   Drug use: No   Sexual activity: Not Currently  Other Topics Concern   Not on file  Social History Narrative   Recently moved with her husband to [place] from [place].   Husband is a retired Pharmacist, community.   No children.   She is a retired Pharmacist, hospital.   Enjoys: Chief Technology Officer, reading - mysteries and biographies, cooking   Exercise: walking, gardening   Diet: low appetite due to cancer treatment, grazing   She is a DNR.   Social Determinants of Health   Financial Resource Strain: Low Risk    Difficulty of Paying Living Expenses: Not hard at all  Food Insecurity:    Worried About Charity fundraiser in the Last Year: Not on file   Ran  Out of Food in the Last Year: Not on file  Transportation Needs:    Lack of Transportation (Medical): Not on file   Lack of Transportation (Non-Medical): Not on file  Physical Activity:    Days of Exercise per Week: Not on file   Minutes of Exercise per Session: Not on file  Stress:    Feeling of Stress : Not on file  Social Connections:    Frequency of Communication with Friends and Family: Not on file   Frequency of Social Gatherings with Friends and Family: Not on file   Attends Religious Services: Not on file   Active Member of Clubs or Organizations: Not on file   Attends Archivist Meetings: Not on file   Marital Status: Not on file  Intimate  Partner Violence:    Fear of Current or Ex-Partner: Not on file   Emotionally Abused: Not on file   Physically Abused: Not on file   Sexually Abused: Not on file    FAMILY HISTORY: Family History  Problem Relation Age of Onset   Diabetes Father    Heart disease Father    Lymphoma Father    Heart disease Mother    Breast cancer Sister 65   Colon cancer Neg Hx    Esophageal cancer Neg Hx    Rectal cancer Neg Hx    Stomach cancer Neg Hx    Bladder Cancer Neg Hx    Kidney cancer Neg Hx     ALLERGIES:  is allergic to sulfa antibiotics.  MEDICATIONS:  Current Outpatient Medications  Medication Sig Dispense Refill   acetaminophen (TYLENOL) 325 MG tablet Take 325 mg by mouth every 6 (six) hours as needed for mild pain.      anastrozole (ARIMIDEX) 1 MG tablet TAKE 1 TABLET BY MOUTH DAILY 30 tablet 3   atorvastatin (LIPITOR) 20 MG tablet Take 1 tablet (20 mg total) by mouth at bedtime. 90 tablet 3   benzonatate (TESSALON) 100 MG capsule Take 1 capsule (100 mg total) by mouth 3 (three) times daily as needed for cough. 60 capsule 0   buPROPion (WELLBUTRIN XL) 150 MG 24 hr tablet TAKE ONE TABLET BY MOUTH EVERY DAY 90 tablet 2   desonide (DESOWEN) 0.05 % lotion Apply 1 application topically as needed (after showering).      Dexlansoprazole (DEXILANT) 30 MG capsule Take 1 capsule (30 mg total) by mouth daily. 90 capsule 3   Dextromethorphan-guaiFENesin (MUCINEX DM) 30-600 MG TB12 Take 1 tablet by mouth 2 (two) times daily as needed (for congestion/cough).     Fesoterodine Fumarate (TOVIAZ PO) Take 1 tablet by mouth daily.     fluticasone (FLONASE) 50 MCG/ACT nasal spray Place 2 sprays into both nostrils daily. 16 g 6   furosemide (LASIX) 20 MG tablet TAKE ONE TABLET EVERY DAY AS NEEDED 30 tablet 2   ketoconazole (NIZORAL) 2 % shampoo Apply 1 application topically. 3 times a week     lubiprostone (AMITIZA) 24 MCG capsule TAKE ONE CAPSULE TWICE A DAY WITH MEALS 60  capsule 3   memantine (NAMENDA) 10 MG tablet TAKE ONE TABLET BY MOUTH TWICE DAILY 180 tablet 2   Multiple Vitamin (MULTIVITAMIN WITH MINERALS) TABS tablet Take 1 tablet by mouth daily.     MYRBETRIQ 50 MG TB24 tablet TAKE ONE TABLET BY MOUTH EVERY DAY 90 tablet 1   polyethylene glycol (MIRALAX / GLYCOLAX) packet Take 17 g by mouth daily.     senna (SENOKOT) 8.6 MG TABS tablet  Take 2 tablets (17.2 mg total) by mouth daily. Stop Colace 60 tablet 0   sucralfate (CARAFATE) 1 g tablet Take 1 tablet (1 g total) by mouth 2 (two) times daily. 60 tablet 0   triamcinolone (NASACORT) 55 MCG/ACT AERO nasal inhaler Place 2 sprays into the nose daily as needed (for congestion).      venlafaxine XR (EFFEXOR-XR) 150 MG 24 hr capsule TAKE 1 CAPSULE BY MOUTH DAILY 90 capsule 1   XALKORI 250 MG capsule TAKE 1 CAPSULE (250 MG TOTAL) BY MOUTH 2 TIMES DAILY. 60 capsule 2   zolpidem (AMBIEN) 5 MG tablet TAKE ONE TABLET AT BEDTIME AS NEEDED FORSLEEP 30 tablet 0   ondansetron (ZOFRAN) 8 MG tablet Take 8 mg by mouth every 8 (eight) hours as needed for nausea.     No current facility-administered medications for this visit.     PHYSICAL EXAMINATION: ECOG PERFORMANCE STATUS: 2 - Symptomatic, <50% confined to bed Vitals:   06/10/19 1157  BP: 106/61  Pulse: (!) 56  Temp: (!) 96.5 F (35.8 C)   Filed Weights   06/10/19 1157  Weight: 136 lb 11.2 oz (62 kg)    Physical Exam  Constitutional: She is oriented to person, place, and time. No distress.  Frail female, walks with a walker  HENT:  Head: Normocephalic and atraumatic.  Nose: Nose normal.  Mouth/Throat: Oropharynx is clear and moist. No oropharyngeal exudate.  Eyes: Pupils are equal, round, and reactive to light. EOM are normal. No scleral icterus.  Cardiovascular: Normal rate and regular rhythm.  No murmur heard. Pulmonary/Chest: Effort normal. No respiratory distress. She has no wheezes. She has no rales. She exhibits no tenderness.    Abdominal: Soft. She exhibits no distension. There is no abdominal tenderness.  Musculoskeletal:        General: Edema present. Normal range of motion.     Cervical back: Normal range of motion and neck supple.     Comments: Bilateral lower extremity 3+ edema, right worse than left.   Neurological: She is alert and oriented to person, place, and time. No cranial nerve deficit. She exhibits normal muscle tone. Coordination normal.  Skin: Skin is warm and dry. She is not diaphoretic. No erythema.  Psychiatric: Affect normal.     CMP Latest Ref Rng & Units 06/10/2019  Glucose 70 - 99 mg/dL 105(H)  BUN 8 - 23 mg/dL 22  Creatinine 0.44 - 1.00 mg/dL 1.22(H)  Sodium 135 - 145 mmol/L 137  Potassium 3.5 - 5.1 mmol/L 3.9  Chloride 98 - 111 mmol/L 100  CO2 22 - 32 mmol/L 28  Calcium 8.9 - 10.3 mg/dL 8.2(L)  Total Protein 6.5 - 8.1 g/dL 6.2(L)  Total Bilirubin 0.3 - 1.2 mg/dL 0.5  Alkaline Phos 38 - 126 U/L 120  AST 15 - 41 U/L 32  ALT 0 - 44 U/L 34   CBC Latest Ref Rng & Units 06/10/2019  WBC 4.0 - 10.5 K/uL 5.8  Hemoglobin 12.0 - 15.0 g/dL 11.3(L)  Hematocrit 36.0 - 46.0 % 35.7(L)  Platelets 150 - 400 K/uL 311    LABORATORY DATA:  I have reviewed the data as listed Lab Results  Component Value Date   WBC 5.8 06/10/2019   HGB 11.3 (L) 06/10/2019   HCT 35.7 (L) 06/10/2019   MCV 99.7 06/10/2019   PLT 311 06/10/2019   Recent Labs    04/15/19 0848 05/13/19 1303 06/10/19 1118  NA 136 137 137  K 4.6 4.1 3.9  CL 102  101 100  CO2 '28 28 28  ' GLUCOSE 135* 89 105*  BUN 35* 29* 22  CREATININE 1.44* 1.29* 1.22*  CALCIUM 8.3* 8.0* 8.2*  GFRNONAA 34* 39* 42*  GFRAA 40* 45* 48*  PROT 6.1* 6.0* 6.2*  ALBUMIN 3.3* 3.2* 3.1*  AST 38 39 32  ALT 41 51* 34  ALKPHOS 113 150* 120  BILITOT 0.7 0.5 0.5   Iron/TIBC/Ferritin/ %Sat No results found for: IRON, TIBC, FERRITIN, IRONPCTSAT   RADIOGRAPHIC STUDIES: I have personally reviewed the radiological images as listed and agreed with the  findings in the report. CT CHEST WO CONTRAST  Result Date: 03/23/2019 CLINICAL DATA:  Lung cancer, breast cancer. EXAM: CT CHEST WITHOUT CONTRAST TECHNIQUE: Multidetector CT imaging of the chest was performed following the standard protocol without IV contrast. COMPARISON:  12/15/2018. FINDINGS: Cardiovascular: Atherosclerotic calcification of the aorta, aortic valve and coronary arteries. Heart size normal. No pericardial effusion. Mediastinum/Nodes: Mediastinal lymph nodes measure up to 10 mm in the low right paratracheal station, as before. Calcified left hilar lymph nodes. Hilar regions are otherwise difficult to evaluate without IV contrast. Left axillary lymph node measures 10 mm, stable. No right axillary or internal mammary adenopathy. Esophagus is unremarkable. Lungs/Pleura: Dominant nodule in the apical left upper lobe measures 1.8 x 2.1 cm (3/25), stable. Associated bronchiectasis and surrounding parenchymal retraction. Calcified granulomas. Additional scattered pulmonary nodules are unchanged. Very mild basilar septal thickening, left greater than right, largely new from the prior exam. No pleural fluid. Airway is unremarkable. Upper Abdomen: Visualized portions of the liver, adrenal glands, kidneys, spleen, pancreas, stomach and bowel are grossly unremarkable. Musculoskeletal: T11 compression deformity is seen with a new area of lucency in the superior endplate (6/62). IMPRESSION: 1. Dominant left upper lobe nodule and multiple scattered additional pulmonary nodular densities are stable. 2. Mild bilateral lower lobe septal thickening, left greater than right, largely new from 12/15/2018. Edema is considered unlikely given normal heart size. Continued attention on follow-up exams is warranted as lymphangitic carcinomatosis cannot be excluded. 3. T11 compression fracture with new lucency in the superior endplate. Difficult to exclude metastatic disease. 4. Aortic atherosclerosis (ICD10-170.0).  Coronary artery calcification. Electronically Signed   By: Lorin Picket M.D.   On: 03/23/2019 09:19   MR BRAIN W WO CONTRAST  Result Date: 03/23/2019 CLINICAL DATA:  Breast and lung cancer.  Restaging. EXAM: MRI HEAD WITHOUT AND WITH CONTRAST TECHNIQUE: Multiplanar, multiecho pulse sequences of the brain and surrounding structures were obtained without and with intravenous contrast. CONTRAST:  38m GADAVIST GADOBUTROL 1 MMOL/ML IV SOLN COMPARISON:  07/20/2018 FINDINGS: Brain: Diffusion imaging does not show any acute or subacute infarction or other cause of restricted diffusion. There chronic small-vessel ischemic changes of the pons. No focal cerebellar insult. Cerebral hemispheres show pronounced generalized atrophy with moderate chronic small-vessel ischemic changes of the white matter. No cortical or large vessel territory infarction. No evidence of mass lesion, hemorrhage, hydrocephalus or extra-axial collection. Vascular: Major vessels at the base of the brain show flow. Skull and upper cervical spine: Negative Sinuses/Orbits: Clear/normal Other: None IMPRESSION: No change. No evidence of metastatic disease. Atrophy and chronic small-vessel ischemic changes. Electronically Signed   By: MNelson ChimesM.D.   On: 03/23/2019 10:46   NM PET Image Restag (PS) Skull Base To Thigh  Result Date: 05/10/2019 CLINICAL DATA:  Subsequent treatment strategy for lung cancer, breast cancer. Fall 2 weeks ago. EXAM: NUCLEAR MEDICINE PET SKULL BASE TO THIGH TECHNIQUE: 8.0 mCi F-18 FDG was injected intravenously. Full-ring  PET imaging was performed from the skull base to thigh after the radiotracer. CT data was obtained and used for attenuation correction and anatomic localization. Fasting blood glucose: 94 mg/dl COMPARISON:  CT chest dated 03/23/2019.  PET-CT dated 06/21/2018. FINDINGS: Mediastinal blood pool activity: SUV max 2.3 Liver activity: SUV max NA NECK: No hypermetabolic cervical lymphadenopathy. Incidental  CT findings: none CHEST: 1.6 x 2.1 cm left apical nodule (series 3/image 34), max SUV 2.5. This previously measured 1.8 x 2.1 cm on recent CT and demonstrated max SUV 7.4 on prior PET. Improving small bilateral pulmonary nodules, including a dominant 3 mm nodule in the medial right upper lobe, now FDG avid. This previously measured 5 mm and demonstrated mild hypermetabolism. Persistent hypermetabolism in the bilateral perihilar regions, including max SUV 4.5 in the left suprahilar region, which may correspond to a 9 mm AP window node (series 3/image 98). On prior PET, max SUV in this region was 11.1. Mild residual hypermetabolism in the right infrahilar region/intralobar right lower lobe, max SUV 3.3, previously 10.4. Very mild sub areolar soft tissue nodularity (series 3/image 37), although without residual hypermetabolism. 9 mm short axis left axillary node (series 3/image 80), previously 10 mm on recent CT, without residual hypermetabolism (max SUV 1.0), previously max SUV 5.6 on prior PET. Incidental CT findings: Mild atherosclerotic calcifications of the aortic arch. Three vessel coronary atherosclerosis. ABDOMEN/PELVIS: No hypermetabolic abdominopelvic lymphadenopathy. No abnormal hypermetabolism in the liver, spleen, pancreas, or adrenal glands. Incidental CT findings: Status post cholecystectomy. Atherosclerotic calcifications the abdominal aorta and branch vessels. Thick-walled bladder. Moderate colonic stool burden, suggesting constipation. SKELETON: No focal hypermetabolic activity to suggest skeletal metastasis. Incidental CT findings: Nondisplaced right lateral 8th and 9th rib fractures (series 3/images 142 and 149), max SUV 3.0, likely posttraumatic. Healing right superior and inferior pubic rami fractures (for example, series 3/image 244), non FDG avid. IMPRESSION: Improving left apical nodule, corresponding to the patient's known primary bronchogenic neoplasm. Near complete resolution of known  pulmonary metastases. Improving hilar/perihilar nodal metastases. Prior subareolar left breast mass has essentially resolved. Small left axillary nodal metastasis, improved, although non FDG avid. Nondisplaced right lateral 8th and 9th rib fractures, likely related to recent fall. Healing right superior and inferior pubic rami fractures, old. Electronically Signed   By: Julian Hy M.D.   On: 05/10/2019 14:17      ASSESSMENT & PLAN:  1. Primary adenocarcinoma of left lung ([place])   2. Malignant neoplasm of left female breast, unspecified estrogen receptor status, unspecified site of breast ([place])   3. Encounter for antineoplastic chemotherapy   4. Aromatase inhibitor use   5. Osteopenia, unspecified location   6. Lower leg edema   Cancer Staging Malignant neoplasm of left female breast [facility]) Staging form: Breast, AJCC 8th Edition - Clinical stage from 12/17/2018: Stage IB (cT1b, cN1, cM0, G1, ER+, PR+, HER2-) - Signed by Earlie Server, MD on 12/17/2018  Primary adenocarcinoma of lung [facility]) Staging form: Lung, AJCC 8th Edition - Clinical stage from 09/28/2018: Stage IVA (cT2, cN3, cM1a) - Signed by Earlie Server, MD on 09/28/2018   Patient has 2 primaries.   Stage IV lung adenocarcinoma, met fusion mutation cT2 N3 M1a PDL 1 TPS more than 70%  Patient clinically doing well. PET scan was independently reviewed by me and discussed with the patient. Good partial response. Continue crizotinib. Labs are reviewed and discussed with patient.   #Breast cancer, responding to Arimidex.  PET scan shows complete resolution of the left breast mass and decreased  axillary lymph node hypermetabolism.  Continue Arimidex. #Transaminitis, possibly secondary to crizotinib use.  Improved. #Frequent falls, establish care with physical therapy. #Osteopenia We will obtain a dental clearance from her dentist.  Discussed about rationale and side effects.  Recommend patient to continue taking calcium and vitamin D  supplementation. With her history of Stage IV lung cancer and chronic aromatase inhibitor use, I recommend Xgeva.  #CKD, creatinine stable. #Insomnia, Ambien 10 mg at bedtime as needed. Up with palliative care.  All questions were answered. The patient knows to call the clinic with any problems questions or concerns. Return of visit: 4 weeks.   Earlie Server, MD, PhD 06/10/2019

## 2019-06-13 ENCOUNTER — Other Ambulatory Visit: Payer: Self-pay | Admitting: Oncology

## 2019-06-14 ENCOUNTER — Inpatient Hospital Stay: Payer: Medicare Other

## 2019-06-14 ENCOUNTER — Ambulatory Visit (INDEPENDENT_AMBULATORY_CARE_PROVIDER_SITE_OTHER)
Admission: RE | Admit: 2019-06-14 | Discharge: 2019-06-14 | Disposition: A | Discharge status: Home / Self Care | Payer: Medicare Other | Source: Ambulatory Visit | Attending: Family Medicine | Admitting: Family Medicine

## 2019-06-14 ENCOUNTER — Encounter: Payer: Self-pay | Admitting: Family Medicine

## 2019-06-14 ENCOUNTER — Telehealth: Payer: Self-pay

## 2019-06-14 ENCOUNTER — Ambulatory Visit (INDEPENDENT_AMBULATORY_CARE_PROVIDER_SITE_OTHER): Payer: Medicare Other | Admitting: Family Medicine

## 2019-06-14 ENCOUNTER — Other Ambulatory Visit: Payer: Self-pay

## 2019-06-14 VITALS — BP 136/62 | HR 49 | Temp 98.0°F | Wt 140.5 lb

## 2019-06-14 DIAGNOSIS — R600 Localized edema: Secondary | ICD-10-CM | POA: Diagnosis not present

## 2019-06-14 DIAGNOSIS — R0781 Pleurodynia: Secondary | ICD-10-CM

## 2019-06-14 DIAGNOSIS — R42 Dizziness and giddiness: Secondary | ICD-10-CM | POA: Diagnosis not present

## 2019-06-14 DIAGNOSIS — W19XXXA Unspecified fall, initial encounter: Secondary | ICD-10-CM

## 2019-06-14 DIAGNOSIS — S2231XA Fracture of one rib, right side, initial encounter for closed fracture: Secondary | ICD-10-CM | POA: Diagnosis not present

## 2019-06-14 DIAGNOSIS — G4700 Insomnia, unspecified: Secondary | ICD-10-CM

## 2019-06-14 NOTE — Assessment & Plan Note (Signed)
Trial of melatonin. Return if no improvement

## 2019-06-14 NOTE — Progress Notes (Signed)
Subjective:     Jamie Matthews is a 79 y.o. female presenting for Fall (last night-06/13/2019. Hit right side of the head and scrapped right arm. Did not loss conscious)     HPI   #Fall - last night - walking toward the bathroom in the hallway - didn't have her walker - floor is hardwood - lost balance with fall - over the past week, if she gets up too quickly and does not sit she will feel dizzy - will get dizzy with position changes for a few minutes - did get dizziness initially with getting out of bed last night, but worsening with walking - denies palpitations - [place] towards the right front of her body - hit her forehead on a low table with a sharp corner - elbow took the damage with some bleeding on the floor - some bleeding initially and now again after bandage getting wet - no loss of consciousness - endorses some mild head discomfort today and worse last night - is moving limbs normal - no new weakness or neurological changes  Not on any blood thinners  Is taking lasix for leg swelling - from the cancer doctor Has noticed positional dizziness has worsened since starting  #Insomnia - Lorrin Mais is not working well - will only sleep for a couple of hours -   Review of Systems  Cardiovascular: Positive for chest pain (rib ).  Neurological: Negative for speech difficulty, weakness and numbness.     Social History   Tobacco Use  Smoking Status Never Smoker  Smokeless Tobacco Never Used        Objective:    BP Readings from Last 3 Encounters:  06/14/19 136/62  06/10/19 106/61  05/13/19 140/73   Wt Readings from Last 3 Encounters:  06/14/19 140 lb 8 oz (63.7 kg)  06/10/19 136 lb 11.2 oz (62 kg)  05/13/19 150 lb (68 kg)    BP 136/62   Pulse (!) 49   Temp 98 F (36.7 C)   Wt 140 lb 8 oz (63.7 kg)   SpO2 96%   BMI 24.89 kg/m    Physical Exam Constitutional:      General: She is not in acute distress.    Appearance: She is  well-developed. She is not diaphoretic.  HENT:     Head: Normocephalic.     Comments: Right temple with bruising and small hematoma. Mild TTP.     Right Ear: Tympanic membrane, ear canal and external ear normal.     Left Ear: Tympanic membrane, ear canal and external ear normal.     Nose: Nose normal. No congestion.     Mouth/Throat:     Mouth: Mucous membranes are moist.  Eyes:     General: No scleral icterus.    Extraocular Movements: Extraocular movements intact.     Conjunctiva/sclera: Conjunctivae normal.     Pupils: Pupils are equal, round, and reactive to light.  Cardiovascular:     Rate and Rhythm: Normal rate and regular rhythm.     Heart sounds: No murmur.  Pulmonary:     Effort: Pulmonary effort is normal. No respiratory distress.     Breath sounds: Normal breath sounds. No wheezing.  Chest:     Chest wall: Tenderness (over the right ribs) present.  Abdominal:     General: Abdomen is flat. Bowel sounds are normal. There is no distension.     Palpations: Abdomen is soft.     Tenderness: There is no  abdominal tenderness. There is no guarding or rebound.  Musculoskeletal:        General: Normal range of motion.     Cervical back: Neck supple.     Comments: Right arm with abrasion injury. Slight ooze, no active bleeding.   Skin:    General: Skin is warm and dry.     Capillary Refill: Capillary refill takes less than 2 seconds.  Neurological:     General: No focal deficit present.     Mental Status: She is alert and oriented to person, place, and time. Mental status is at baseline.     Cranial Nerves: No cranial nerve deficit.     Sensory: No sensory deficit.     Motor: No weakness.     Coordination: Coordination normal.  Psychiatric:        Mood and Affect: Mood normal.        Behavior: Behavior normal.           Assessment & Plan:   Problem List Items Addressed This Visit      Other   Insomnia    Trial of melatonin. Return if no improvement       Lower leg edema    Other Visit Diagnoses    Fall, initial encounter    -  Primary   Relevant Orders   DG Chest 2 View   Rib pain on right side       Relevant Orders   DG Chest 2 View   Orthostatic dizziness         Suspect that her lasix contributed to postural dizziness and not using walker. Reassuring neuro exam and not on blood thinner. ER precautions for brain injury/bleed discussed.   Follow-up final read for rib pain.  Patient Instructions  #Leg swelling - I think that the lasix is contributing to your dizziness - decrease - can take 1/2 tablet or only take every other day - continue compression socks - use your WALKER - slow transitions   #Rib Pain - X-ray today to look for fracture - if broken may take 6-8 weeks to heal - use ice and anti-inflammatory medications - if not broken should improve in 4-6 weeks  #Head pain - no need for imaging - if you develop nausea, vomiting, worsening headache, new neurological changes (weakness, difficulty speaking, confusion, sensation changes) -- go to the ER as this may be a sign of a bleed in your brain  #Insomnia - try taking Melatonin 3-5 mg  - take 1-2 hours before bed - if no improvement, return to discuss  #Arm injury - use non-adhesive bandage to keep covered - can use Vaseline  - Once it starts to dry and heal, try to keep it open to the air and only cover if worrying about damage     Return if symptoms worsen or fail to improve.  [name], MD

## 2019-06-14 NOTE — Telephone Encounter (Signed)
Message from [employer] "[place] called and states she fell last night - has a "knot on her forehead, torn skin from her forearm and maybe some fractured right ribs.  She wants to know if she needs x-rays???  States she has an appointment today at 1:30 for an injection, but has not heard from her dentist with approval to get that today."   Spoke with patient and notified her that she needs to notify PCP for follow up on fall. We have received dental clearance, but injection will need to be held due to possible bone fractures. Patient is aware and will call PCP and is aware that appt today will be postponed until we hear back from her.

## 2019-06-14 NOTE — Patient Instructions (Addendum)
#  Leg swelling - I think that the lasix is contributing to your dizziness - decrease - can take 1/2 tablet or only take every other day - continue compression socks - use your WALKER - slow transitions   #Rib Pain - X-ray today to look for fracture - if broken may take 6-8 weeks to heal - use ice and anti-inflammatory medications - if not broken should improve in 4-6 weeks  #Head pain - no need for imaging - if you develop nausea, vomiting, worsening headache, new neurological changes (weakness, difficulty speaking, confusion, sensation changes) -- go to the ER as this may be a sign of a bleed in your brain  #Insomnia - try taking Melatonin 3-5 mg  - take 1-2 hours before bed - if no improvement, return to discuss  #Arm injury - use non-adhesive bandage to keep covered - can use Vaseline  - Once it starts to dry and heal, try to keep it open to the air and only cover if worrying about damage

## 2019-06-15 NOTE — Telephone Encounter (Signed)
Patient had chest xray ordered by PCP. Dr. Tasia Catchings notified to review results. Per Dr. Tasia Catchings, schedule lab bmp and xgeva in 2 weeks.

## 2019-06-15 NOTE — Telephone Encounter (Signed)
Done... Appts has been scheduled as requested.

## 2019-06-16 NOTE — Telephone Encounter (Signed)
Pt aware of results - relayed recommendations.  She never reviewed these online.   Advised that if she does not feel that she is improving to let us know.   Will send as FYI.

## 2019-06-28 ENCOUNTER — Other Ambulatory Visit: Payer: Self-pay | Admitting: Family Medicine

## 2019-06-29 ENCOUNTER — Other Ambulatory Visit: Payer: Self-pay

## 2019-06-29 ENCOUNTER — Telehealth: Payer: Self-pay

## 2019-06-29 ENCOUNTER — Inpatient Hospital Stay: Payer: Medicare Other

## 2019-06-29 ENCOUNTER — Inpatient Hospital Stay: Payer: Medicare Other | Attending: Oncology

## 2019-06-29 DIAGNOSIS — Z833 Family history of diabetes mellitus: Secondary | ICD-10-CM | POA: Diagnosis not present

## 2019-06-29 DIAGNOSIS — Z8249 Family history of ischemic heart disease and other diseases of the circulatory system: Secondary | ICD-10-CM | POA: Insufficient documentation

## 2019-06-29 DIAGNOSIS — C773 Secondary and unspecified malignant neoplasm of axilla and upper limb lymph nodes: Secondary | ICD-10-CM | POA: Insufficient documentation

## 2019-06-29 DIAGNOSIS — C50812 Malignant neoplasm of overlapping sites of left female breast: Secondary | ICD-10-CM | POA: Insufficient documentation

## 2019-06-29 DIAGNOSIS — Z7189 Other specified counseling: Secondary | ICD-10-CM

## 2019-06-29 DIAGNOSIS — C3490 Malignant neoplasm of unspecified part of unspecified bronchus or lung: Secondary | ICD-10-CM

## 2019-06-29 DIAGNOSIS — K589 Irritable bowel syndrome without diarrhea: Secondary | ICD-10-CM | POA: Diagnosis not present

## 2019-06-29 DIAGNOSIS — Z803 Family history of malignant neoplasm of breast: Secondary | ICD-10-CM | POA: Insufficient documentation

## 2019-06-29 DIAGNOSIS — Z170 Estrogen receptor positive status [ER+]: Secondary | ICD-10-CM | POA: Diagnosis not present

## 2019-06-29 DIAGNOSIS — K219 Gastro-esophageal reflux disease without esophagitis: Secondary | ICD-10-CM | POA: Insufficient documentation

## 2019-06-29 DIAGNOSIS — C3412 Malignant neoplasm of upper lobe, left bronchus or lung: Secondary | ICD-10-CM | POA: Insufficient documentation

## 2019-06-29 DIAGNOSIS — R5383 Other fatigue: Secondary | ICD-10-CM | POA: Diagnosis not present

## 2019-06-29 DIAGNOSIS — M8580 Other specified disorders of bone density and structure, unspecified site: Secondary | ICD-10-CM | POA: Insufficient documentation

## 2019-06-29 DIAGNOSIS — R7989 Other specified abnormal findings of blood chemistry: Secondary | ICD-10-CM

## 2019-06-29 DIAGNOSIS — Z9181 History of falling: Secondary | ICD-10-CM | POA: Diagnosis not present

## 2019-06-29 DIAGNOSIS — Z9049 Acquired absence of other specified parts of digestive tract: Secondary | ICD-10-CM | POA: Insufficient documentation

## 2019-06-29 DIAGNOSIS — W19XXXA Unspecified fall, initial encounter: Secondary | ICD-10-CM | POA: Insufficient documentation

## 2019-06-29 DIAGNOSIS — M7989 Other specified soft tissue disorders: Secondary | ICD-10-CM | POA: Insufficient documentation

## 2019-06-29 DIAGNOSIS — Z882 Allergy status to sulfonamides status: Secondary | ICD-10-CM | POA: Diagnosis not present

## 2019-06-29 DIAGNOSIS — Z79899 Other long term (current) drug therapy: Secondary | ICD-10-CM | POA: Diagnosis not present

## 2019-06-29 DIAGNOSIS — Z79811 Long term (current) use of aromatase inhibitors: Secondary | ICD-10-CM | POA: Diagnosis not present

## 2019-06-29 DIAGNOSIS — R296 Repeated falls: Secondary | ICD-10-CM | POA: Diagnosis not present

## 2019-06-29 DIAGNOSIS — Z808 Family history of malignant neoplasm of other organs or systems: Secondary | ICD-10-CM | POA: Diagnosis not present

## 2019-06-29 DIAGNOSIS — M1990 Unspecified osteoarthritis, unspecified site: Secondary | ICD-10-CM | POA: Diagnosis not present

## 2019-06-29 DIAGNOSIS — I700 Atherosclerosis of aorta: Secondary | ICD-10-CM | POA: Insufficient documentation

## 2019-06-29 DIAGNOSIS — S2241XA Multiple fractures of ribs, right side, initial encounter for closed fracture: Secondary | ICD-10-CM | POA: Diagnosis not present

## 2019-06-29 DIAGNOSIS — C50912 Malignant neoplasm of unspecified site of left female breast: Secondary | ICD-10-CM

## 2019-06-29 DIAGNOSIS — Z8719 Personal history of other diseases of the digestive system: Secondary | ICD-10-CM | POA: Insufficient documentation

## 2019-06-29 LAB — CBC WITH DIFFERENTIAL/PLATELET
Abs Immature Granulocytes: 0.04 10*3/uL (ref 0.00–0.07)
Basophils Absolute: 0 10*3/uL (ref 0.0–0.1)
Basophils Relative: 1 %
Eosinophils Absolute: 0.4 10*3/uL (ref 0.0–0.5)
Eosinophils Relative: 10 %
HCT: 30.7 % — ABNORMAL LOW (ref 36.0–46.0)
Hemoglobin: 9.4 g/dL — ABNORMAL LOW (ref 12.0–15.0)
Immature Granulocytes: 1 %
Lymphocytes Relative: 31 %
Lymphs Abs: 1.2 10*3/uL (ref 0.7–4.0)
MCH: 31.3 pg (ref 26.0–34.0)
MCHC: 30.6 g/dL (ref 30.0–36.0)
MCV: 102.3 fL — ABNORMAL HIGH (ref 80.0–100.0)
Monocytes Absolute: 1 10*3/uL (ref 0.1–1.0)
Monocytes Relative: 27 %
Neutro Abs: 1.2 10*3/uL — ABNORMAL LOW (ref 1.7–7.7)
Neutrophils Relative %: 30 %
Platelets: 331 10*3/uL (ref 150–400)
RBC: 3 MIL/uL — ABNORMAL LOW (ref 3.87–5.11)
RDW: 13.3 % (ref 11.5–15.5)
WBC: 3.9 10*3/uL — ABNORMAL LOW (ref 4.0–10.5)
nRBC: 0 % (ref 0.0–0.2)

## 2019-06-29 LAB — COMPREHENSIVE METABOLIC PANEL
ALT: 37 U/L (ref 0–44)
AST: 34 U/L (ref 15–41)
Albumin: 2.7 g/dL — ABNORMAL LOW (ref 3.5–5.0)
Alkaline Phosphatase: 115 U/L (ref 38–126)
Anion gap: 6 (ref 5–15)
BUN: 21 mg/dL (ref 8–23)
CO2: 26 mmol/L (ref 22–32)
Calcium: 7.7 mg/dL — ABNORMAL LOW (ref 8.9–10.3)
Chloride: 107 mmol/L (ref 98–111)
Creatinine, Ser: 1.17 mg/dL — ABNORMAL HIGH (ref 0.44–1.00)
GFR calc Af Amer: 51 mL/min — ABNORMAL LOW (ref >60)
GFR calc non Af Amer: 44 mL/min — ABNORMAL LOW (ref >60)
Glucose, Bld: 120 mg/dL — ABNORMAL HIGH (ref 70–99)
Potassium: 4 mmol/L (ref 3.5–5.1)
Sodium: 139 mmol/L (ref 135–145)
Total Bilirubin: 0.4 mg/dL (ref 0.3–1.2)
Total Protein: 5.4 g/dL — ABNORMAL LOW (ref 6.5–8.1)

## 2019-06-29 NOTE — Telephone Encounter (Signed)
Patient was not able to receive Xgeva inj today due to calcium lab results.  Dr. Tasia Catchings wants patient to start taking Calcium 1200 mg QD and come back in 1 week for lab (BMP)/Xgeva.  Patient aware.

## 2019-07-06 ENCOUNTER — Inpatient Hospital Stay: Payer: Medicare Other

## 2019-07-07 ENCOUNTER — Inpatient Hospital Stay (HOSPITAL_BASED_OUTPATIENT_CLINIC_OR_DEPARTMENT_OTHER): Payer: Medicare Other | Admitting: Hospice and Palliative Medicine

## 2019-07-07 DIAGNOSIS — Z515 Encounter for palliative care: Secondary | ICD-10-CM

## 2019-07-07 NOTE — Progress Notes (Signed)
Unable to reach patient by phone.  Voicemail left.  Will reschedule visit.

## 2019-07-08 MED FILL — XALKORI 250 MG CAPSULE: 250 | 30 days supply | Qty: 60 | Fill #1

## 2019-07-12 ENCOUNTER — Encounter: Payer: Self-pay | Admitting: *Deleted

## 2019-07-12 NOTE — Progress Notes (Signed)
Patient called and left me a message questioning if she should still get her infusion this Friday since she had fallen again last Friday, and thinks she may have fractured ribs.  It was recommended by Dr. Collie Siad team to have her call her primary care provider to get x-rays to confirm a fracture prior to her appointment on Friday.  I returned the patient's call and left her a message with the above information.  I also asked if she was taking the Calcium as prescribed by Dr. Tasia Catchings, and to call me back with that information.

## 2019-07-14 ENCOUNTER — Telehealth: Payer: Self-pay | Admitting: *Deleted

## 2019-07-14 NOTE — Telephone Encounter (Signed)
Patient called to report that she is taking the supplements for her bones as instructed and that she has still not had er ribs xrayed yet due to the weather

## 2019-07-15 ENCOUNTER — Encounter: Payer: Self-pay | Admitting: Family Medicine

## 2019-07-15 ENCOUNTER — Inpatient Hospital Stay: Payer: Medicare Other

## 2019-07-15 ENCOUNTER — Ambulatory Visit (INDEPENDENT_AMBULATORY_CARE_PROVIDER_SITE_OTHER)
Admission: RE | Admit: 2019-07-15 | Discharge: 2019-07-15 | Disposition: A | Discharge status: Home / Self Care | Payer: Medicare Other | Source: Ambulatory Visit | Attending: Family Medicine | Admitting: Family Medicine

## 2019-07-15 ENCOUNTER — Other Ambulatory Visit: Payer: Self-pay | Admitting: Family Medicine

## 2019-07-15 ENCOUNTER — Inpatient Hospital Stay (HOSPITAL_BASED_OUTPATIENT_CLINIC_OR_DEPARTMENT_OTHER): Payer: Medicare Other | Admitting: Oncology

## 2019-07-15 ENCOUNTER — Other Ambulatory Visit: Payer: Self-pay

## 2019-07-15 ENCOUNTER — Encounter: Payer: Self-pay | Admitting: Oncology

## 2019-07-15 ENCOUNTER — Ambulatory Visit (INDEPENDENT_AMBULATORY_CARE_PROVIDER_SITE_OTHER): Payer: Medicare Other | Admitting: Family Medicine

## 2019-07-15 VITALS — BP 120/62 | HR 60 | Temp 97.8°F | Ht 63.0 in | Wt 144.1 lb

## 2019-07-15 VITALS — BP 118/57 | HR 48 | Temp 97.4°F | Resp 16 | Wt 144.0 lb

## 2019-07-15 DIAGNOSIS — S2241XA Multiple fractures of ribs, right side, initial encounter for closed fracture: Secondary | ICD-10-CM | POA: Diagnosis not present

## 2019-07-15 DIAGNOSIS — C3490 Malignant neoplasm of unspecified part of unspecified bronchus or lung: Secondary | ICD-10-CM

## 2019-07-15 DIAGNOSIS — W19XXXA Unspecified fall, initial encounter: Secondary | ICD-10-CM | POA: Insufficient documentation

## 2019-07-15 DIAGNOSIS — C50812 Malignant neoplasm of overlapping sites of left female breast: Secondary | ICD-10-CM | POA: Diagnosis not present

## 2019-07-15 DIAGNOSIS — Z170 Estrogen receptor positive status [ER+]: Secondary | ICD-10-CM | POA: Diagnosis not present

## 2019-07-15 DIAGNOSIS — R5383 Other fatigue: Secondary | ICD-10-CM | POA: Diagnosis not present

## 2019-07-15 DIAGNOSIS — R0781 Pleurodynia: Secondary | ICD-10-CM

## 2019-07-15 DIAGNOSIS — C50912 Malignant neoplasm of unspecified site of left female breast: Secondary | ICD-10-CM | POA: Diagnosis not present

## 2019-07-15 DIAGNOSIS — C3412 Malignant neoplasm of upper lobe, left bronchus or lung: Secondary | ICD-10-CM | POA: Diagnosis not present

## 2019-07-15 DIAGNOSIS — M7989 Other specified soft tissue disorders: Secondary | ICD-10-CM | POA: Diagnosis not present

## 2019-07-15 DIAGNOSIS — Z7189 Other specified counseling: Secondary | ICD-10-CM

## 2019-07-15 DIAGNOSIS — M85851 Other specified disorders of bone density and structure, right thigh: Secondary | ICD-10-CM | POA: Diagnosis not present

## 2019-07-15 DIAGNOSIS — S2249XA Multiple fractures of ribs, unspecified side, initial encounter for closed fracture: Secondary | ICD-10-CM | POA: Insufficient documentation

## 2019-07-15 DIAGNOSIS — Y92009 Unspecified place in unspecified non-institutional (private) residence as the place of occurrence of the external cause: Secondary | ICD-10-CM

## 2019-07-15 DIAGNOSIS — C773 Secondary and unspecified malignant neoplasm of axilla and upper limb lymph nodes: Secondary | ICD-10-CM | POA: Diagnosis not present

## 2019-07-15 DIAGNOSIS — M8580 Other specified disorders of bone density and structure, unspecified site: Secondary | ICD-10-CM | POA: Diagnosis not present

## 2019-07-15 DIAGNOSIS — R7989 Other specified abnormal findings of blood chemistry: Secondary | ICD-10-CM

## 2019-07-15 LAB — COMPREHENSIVE METABOLIC PANEL
ALT: 32 U/L (ref 0–44)
AST: 25 U/L (ref 15–41)
Albumin: 2.9 g/dL — ABNORMAL LOW (ref 3.5–5.0)
Alkaline Phosphatase: 123 U/L (ref 38–126)
Anion gap: 8 (ref 5–15)
BUN: 24 mg/dL — ABNORMAL HIGH (ref 8–23)
CO2: 28 mmol/L (ref 22–32)
Calcium: 8.3 mg/dL — ABNORMAL LOW (ref 8.9–10.3)
Chloride: 105 mmol/L (ref 98–111)
Creatinine, Ser: 1.03 mg/dL — ABNORMAL HIGH (ref 0.44–1.00)
GFR calc Af Amer: 59 mL/min — ABNORMAL LOW (ref >60)
GFR calc non Af Amer: 51 mL/min — ABNORMAL LOW (ref >60)
Glucose, Bld: 100 mg/dL — ABNORMAL HIGH (ref 70–99)
Potassium: 4.3 mmol/L (ref 3.5–5.1)
Sodium: 141 mmol/L (ref 135–145)
Total Bilirubin: 0.5 mg/dL (ref 0.3–1.2)
Total Protein: 6 g/dL — ABNORMAL LOW (ref 6.5–8.1)

## 2019-07-15 LAB — CBC WITH DIFFERENTIAL/PLATELET
Abs Immature Granulocytes: 0.05 10*3/uL (ref 0.00–0.07)
Basophils Absolute: 0 10*3/uL (ref 0.0–0.1)
Basophils Relative: 1 %
Eosinophils Absolute: 0.4 10*3/uL (ref 0.0–0.5)
Eosinophils Relative: 9 %
HCT: 33.9 % — ABNORMAL LOW (ref 36.0–46.0)
Hemoglobin: 10.5 g/dL — ABNORMAL LOW (ref 12.0–15.0)
Immature Granulocytes: 1 %
Lymphocytes Relative: 32 %
Lymphs Abs: 1.3 10*3/uL (ref 0.7–4.0)
MCH: 31.5 pg (ref 26.0–34.0)
MCHC: 31 g/dL (ref 30.0–36.0)
MCV: 101.8 fL — ABNORMAL HIGH (ref 80.0–100.0)
Monocytes Absolute: 1 10*3/uL (ref 0.1–1.0)
Monocytes Relative: 23 %
Neutro Abs: 1.4 10*3/uL — ABNORMAL LOW (ref 1.7–7.7)
Neutrophils Relative %: 34 %
Platelets: 347 10*3/uL (ref 150–400)
RBC: 3.33 MIL/uL — ABNORMAL LOW (ref 3.87–5.11)
RDW: 13.9 % (ref 11.5–15.5)
WBC: 4.2 10*3/uL (ref 4.0–10.5)
nRBC: 0 % (ref 0.0–0.2)

## 2019-07-15 NOTE — Assessment & Plan Note (Addendum)
She is no longer on toviaz. Stay off lasix as it could increase fall risk.  Will refer to [facility] physical therapy.

## 2019-07-15 NOTE — Progress Notes (Signed)
Patient had a xray today due to rib pain after a recent fall.

## 2019-07-15 NOTE — Patient Instructions (Addendum)
Stay off lasix at this time.  Rib xray today - we will be in touch with radiology read.  Continue tylenol 1000mg  at night, if needed you can take another 1000mg  during the day. Use heating pad to area.  We will refer you to physical therapy fall prevention program again.

## 2019-07-15 NOTE — Assessment & Plan Note (Addendum)
Check xray for falls.  Radiology read consistent with multiple rib fractures lateral R ribcage - she does not have dyspnea or signs of flail chest, and pain currently controlled with tylenol. Supportive home care reviewed, update if not improving with this or if pain not well controlled with tylenol.

## 2019-07-15 NOTE — Assessment & Plan Note (Signed)
Now with rib fracture after fall - would meet criteria for osteoporosis treatment. Reviewing chart, it looks like onc is planning to treat with Delton See - but latest treatment was postponed due to low calcium - pt now on calcium supplementation through onc.  Will forward to PCP as FYI.

## 2019-07-15 NOTE — Progress Notes (Signed)
This visit was conducted in person.  BP 120/62 (BP Location: Left Arm, Patient Position: Sitting, Cuff Size: Normal)   Pulse 60   Temp 97.8 F (36.6 C) (Temporal)   Ht 5\' 3"  (1.6 m)   Wt 144 lb 2 oz (65.4 kg)   SpO2 93%   BMI 25.53 kg/m    CC: "i've been falling quite a bit" Subjective:    Patient ID: Jamie Matthews, female    DOB: February 20, 1941, 79 y.o.   MRN: [id number]  HPI: Jamie Matthews is a 79 y.o. female presenting on 07/15/2019 for Fall (Pt fell 1 wk ago at home.  C/o sharp rib pain under right breast, with movement.  Concerned of possible fx. )   DOI: 07/08/2019 Fell at home while opening refrigerator, hit R lower chest wall onto refrigerator door - with residual sharp rib pain under R breast, worse with side movement. This is despite regularly using her walker - started using consistently in Spring 2020. Managing pain with tylenol with benefit.   3 falls in the past month - while going to the bathroom (without walker). She cracked glasses on previous fall.  Notes worsening unsteadiness but no premonitory symptoms of dizziness.  She was taking lasix - has stopped this.   No dyspnea or abd pain, nausea, bowel changes. Denies foot paresthesias.  Dry cough that started 1-2 days ago associated with ST. No head congestion, fevers/chills, loss of taste or smell, diarrhea.   H/o lung cancer as well as breast cancer (2 primaries) - sees onc monthly on xalkori and arimidex - next appt later today.  On myrbetriq (in place of toviaz) for urinary incontinence.  On ambien 5mg  nightly PRN for sleep - has not taken in the last several weeks.   Osteopenia with DEXA 11/2017 showing T +1.1 spine, T -1.8 R hip.       Relevant past medical, surgical, family and social history reviewed and updated as indicated. Interim medical history since our last visit reviewed. Allergies and medications reviewed and updated. Outpatient Medications Prior to Visit  Medication  Sig Dispense Refill  . acetaminophen (TYLENOL) 325 MG tablet Take 325 mg by mouth every 6 (six) hours as needed for mild pain.     [name] anastrozole (ARIMIDEX) 1 MG tablet TAKE 1 TABLET BY MOUTH DAILY 30 tablet 3  . atorvastatin (LIPITOR) 20 MG tablet Take 1 tablet (20 mg total) by mouth at bedtime. 90 tablet 3  . benzonatate (TESSALON) 100 MG capsule Take 1 capsule (100 mg total) by mouth 3 (three) times daily as needed for cough. 60 capsule 0  . buPROPion (WELLBUTRIN XL) 150 MG 24 hr tablet TAKE ONE TABLET BY MOUTH EVERY DAY 90 tablet 2  . desonide (DESOWEN) 0.05 % lotion Apply 1 application topically as needed (after showering).     . Dexlansoprazole (DEXILANT) 30 MG capsule Take 1 capsule (30 mg total) by mouth daily. 90 capsule 3  . Dextromethorphan-guaiFENesin (MUCINEX DM) 30-600 MG TB12 Take 1 tablet by mouth 2 (two) times daily as needed (for congestion/cough).    . fluticasone (FLONASE) 50 MCG/ACT nasal spray Place 2 sprays into both nostrils daily. (Patient not taking: Reported on 07/15/2019) 16 g 6  . ketoconazole (NIZORAL) 2 % shampoo Apply 1 application topically. 3 times a week    . lubiprostone (AMITIZA) 24 MCG capsule TAKE ONE CAPSULE TWICE A DAY WITH MEALS (Patient not taking: Reported on 07/15/2019) 60 capsule 3  . memantine (NAMENDA) 10  MG tablet TAKE ONE TABLET BY MOUTH TWICE DAILY 180 tablet 2  . Multiple Vitamin (MULTIVITAMIN WITH MINERALS) TABS tablet Take 1 tablet by mouth daily.    [name] MYRBETRIQ 50 MG TB24 tablet TAKE ONE TABLET BY MOUTH EVERY DAY 90 tablet 1  . ondansetron (ZOFRAN) 8 MG tablet Take 8 mg by mouth every 8 (eight) hours as needed for nausea.    . polyethylene glycol (MIRALAX / GLYCOLAX) packet Take 17 g by mouth daily.    [name] senna (SENOKOT) 8.6 MG TABS tablet Take 2 tablets (17.2 mg total) by mouth daily. Stop Colace (Patient not taking: Reported on 07/15/2019) 60 tablet 0  . sucralfate (CARAFATE) 1 g tablet Take 1 tablet (1 g total) by mouth 2 (two) times daily.  (Patient taking differently: Take 1 g by mouth 2 (two) times daily. prn) 60 tablet 0  . triamcinolone (NASACORT) 55 MCG/ACT AERO nasal inhaler Place 2 sprays into the nose daily as needed (for congestion).     . venlafaxine XR (EFFEXOR-XR) 150 MG 24 hr capsule TAKE 1 CAPSULE BY MOUTH EVERY DAY 90 capsule 1  . XALKORI 250 MG capsule TAKE 1 CAPSULE (250 MG TOTAL) BY MOUTH 2 TIMES DAILY. 60 capsule 2  . zolpidem (AMBIEN) 5 MG tablet TAKE ONE TABLET AT BEDTIME AS NEEDED FORSLEEP (Patient not taking: Reported on 07/15/2019) 30 tablet 0  . Fesoterodine Fumarate (TOVIAZ PO) Take 1 tablet by mouth daily.    . furosemide (LASIX) 20 MG tablet TAKE ONE TABLET EVERY DAY AS NEEDED (Patient not taking: Reported on 07/15/2019) 30 tablet 2   No facility-administered medications prior to visit.     Per HPI unless specifically indicated in ROS section below Review of Systems Objective:    BP 120/62 (BP Location: Left Arm, Patient Position: Sitting, Cuff Size: Normal)   Pulse 60   Temp 97.8 F (36.6 C) (Temporal)   Ht 5\' 3"  (1.6 m)   Wt 144 lb 2 oz (65.4 kg)   SpO2 93%   BMI 25.53 kg/m   Wt Readings from Last 3 Encounters:  07/15/19 144 lb (65.3 kg)  07/15/19 144 lb 2 oz (65.4 kg)  06/14/19 140 lb 8 oz (63.7 kg)    Physical Exam Vitals and nursing note reviewed.  Constitutional:      Appearance: Normal appearance. She is not ill-appearing.     Comments: Uses walker  HENT:     Head: Normocephalic and atraumatic.  Eyes:     Extraocular Movements: Extraocular movements intact.     Pupils: Pupils are equal, round, and reactive to light.  Cardiovascular:     Rate and Rhythm: Normal rate and regular rhythm.     Pulses: Normal pulses.     Heart sounds: Murmur (faint systolic) present.  Pulmonary:     Effort: Pulmonary effort is normal. No respiratory distress.     Breath sounds: Normal breath sounds. No wheezing, rhonchi or rales.       Comments: Maximal point tenderness to R lateral ribcage,  with mild discomfort anterior and posteriorly Chest:     Chest wall: Tenderness present.  Skin:    Findings: No rash.  Neurological:     Mental Status: She is alert.  Psychiatric:        Mood and Affect: Mood normal.        Behavior: Behavior normal.       Results for orders placed or performed in visit on 06/29/19  Comprehensive metabolic panel  Result Value Ref  Range   Sodium 139 135 - 145 mmol/L   Potassium 4.0 3.5 - 5.1 mmol/L   Chloride 107 98 - 111 mmol/L   CO2 26 22 - 32 mmol/L   Glucose, Bld 120 (H) 70 - 99 mg/dL   BUN 21 8 - 23 mg/dL   Creatinine, Ser 1.17 (H) 0.44 - 1.00 mg/dL   Calcium 7.7 (L) 8.9 - 10.3 mg/dL   Total Protein 5.4 (L) 6.5 - 8.1 g/dL   Albumin 2.7 (L) 3.5 - 5.0 g/dL   AST 34 15 - 41 U/L   ALT 37 0 - 44 U/L   Alkaline Phosphatase 115 38 - 126 U/L   Total Bilirubin 0.4 0.3 - 1.2 mg/dL   GFR calc non Af Amer 44 (L) >60 mL/min   GFR calc Af Amer 51 (L) >60 mL/min   Anion gap 6 5 - 15  CBC with Differential/Platelet  Result Value Ref Range   WBC 3.9 (L) 4.0 - 10.5 K/uL   RBC 3.00 (L) 3.87 - 5.11 MIL/uL   Hemoglobin 9.4 (L) 12.0 - 15.0 g/dL   HCT 30.7 (L) 36.0 - 46.0 %   MCV 102.3 (H) 80.0 - 100.0 fL   MCH 31.3 26.0 - 34.0 pg   MCHC 30.6 30.0 - 36.0 g/dL   RDW 13.3 11.5 - 15.5 %   Platelets 331 150 - 400 K/uL   nRBC 0.0 0.0 - 0.2 %   Neutrophils Relative % 30 %   Neutro Abs 1.2 (L) 1.7 - 7.7 K/uL   Lymphocytes Relative 31 %   Lymphs Abs 1.2 0.7 - 4.0 K/uL   Monocytes Relative 27 %   Monocytes Absolute 1.0 0.1 - 1.0 K/uL   Eosinophils Relative 10 %   Eosinophils Absolute 0.4 0.0 - 0.5 K/uL   Basophils Relative 1 %   Basophils Absolute 0.0 0.0 - 0.1 K/uL   Immature Granulocytes 1 %   Abs Immature Granulocytes 0.04 0.00 - 0.07 K/uL   DG Ribs Unilateral W/Chest Right CLINICAL DATA:  Fall 1 week ago.  Right chest wall pain.  EXAM: RIGHT RIBS AND CHEST - 3+ VIEW  COMPARISON:  06/14/2019  FINDINGS: Multiple right rib fractures. Mildly  displaced fracture of the lateral right sixth rib is unchanged compared to the prior chest radiograph.  Posterior fractures of the right third and fourth ribs appear new since the prior radiographs.  There is a fracture across the posterolateral right seventh rib, and there are recent appearing fractures of the anterolateral right eighth and ninth ribs.  No bone lesions.  Cardiac silhouette is normal in size. No mediastinal or hilar masses.  Mild opacity at the right lung base is likely atelectasis. Stable granuloma noted in the left upper lobe. Lungs are otherwise clear.  No pneumothorax.  IMPRESSION: 1. Multiple rib fractures as described, with fractures of the posterolateral seventh, anterolateral eighth and ninth, and posterior third and fourth ribs appearing recent and new since the prior chest radiograph. 2. Mild opacity at the right lung base is likely atelectasis. 3. No pneumothorax.  Electronically Signed   By: Lajean Manes M.D.   On: 07/15/2019 13:30   Assessment & Plan:  This visit occurred during the SARS-CoV-2 public health emergency.  Safety protocols were in place, including screening questions prior to the visit, additional usage of staff PPE, and extensive cleaning of exam room while observing appropriate contact time as indicated for disinfecting solutions.   Problem List Items Addressed This Visit  Osteopenia    Now with rib fracture after fall - would meet criteria for osteoporosis treatment. Reviewing chart, it looks like onc is planning to treat with Delton See - but latest treatment was postponed due to low calcium - pt now on calcium supplementation through onc.  Will forward to PCP as FYI.       Multiple rib fractures - Primary    Check xray for falls.  Radiology read consistent with multiple rib fractures lateral R ribcage - she does not have dyspnea or signs of flail chest, and pain currently controlled with tylenol. Supportive home care reviewed,  update if not improving with this or if pain not well controlled with tylenol.       Fall as cause of accidental injury at home as place of occurrence    She is no longer on toviaz. Stay off lasix as it could increase fall risk.  Will refer to [facility] physical therapy.       Relevant Orders   Ambulatory referral to Physical Therapy       No orders of the defined types were placed in this encounter.  Orders Placed This Encounter  Procedures  . Ambulatory referral to Physical Therapy    Referral Priority:   Routine    Referral Type:   Physical Medicine    Referral Reason:   Specialty Services Required    Requested Specialty:   Physical Therapy    Number of Visits Requested:   1    Patient Instructions  Stay off lasix at this time.  Rib xray today - we will be in touch with radiology read.  Continue tylenol 1000mg  at night, if needed you can take another 1000mg  during the day. Use heating pad to area.  We will refer you to physical therapy fall prevention program again.    Follow up plan: No follow-ups on file.  Ria Bush, MD

## 2019-07-15 NOTE — Progress Notes (Signed)
Hematology/Oncology follow up note [facility] [phone] [phone]   Patient Care Team: [name], MD as PCP - General (Family Medicine) Osborne Oman, MD as Consulting Physician (Obstetrics and Gynecology) Pyrtle, Lajuan Lines, MD as Consulting Physician (Gastroenterology) Telford Nab, RN as Registered Nurse Earlie Server, MD as Consulting Physician (Hematology and Oncology)   REASON FOR VISIT:  Follow up for management of lung cancer and breast cancer  HISTORY OF PRESENTING ILLNESS:  Jamie Matthews is a  79 y.o.  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 05/05/2018 bilateral diagnostic breast mammogram showed suspicious mass 1.1cm  in the 12:00 retroareolar region of the left breast and the left axillary lymph node. 06/08/2018 patient status post a left breast retroareolar and left axillary lymph node biopsy. Pathology showed invasive mammary carcinoma, no special type, grade 1, left axillary lymph node positive for invasive mammary carcinoma clinically metastatic.  Background lymph node architecture is not identified. ER> 90% PR> 90%, HER-2 negative.  PET scan done which unfortunately showed additional hypermetabolic bilateral hilar lymph nodes as well as left upper lobe lung mass which may represent a focus of metastatic disease from breast, or primary lung neoplasm.  There is multiple additional small pulmonary nodules are scattered throughout both lungs which are worrisome for metastasis.  Index nodule within the medial right upper lobe measures 7 mm,, peri-broncho-vascular nodule in the right lower lobe measures 1.2 cm.  # Stage IV lung cancer-  Biopsy of lung mass left upper lobe showed non-small cell lung cancer, favor adenocarcinoma.  #NGS came back patient has PD L1 is 70% TPS, MET fusion mutation.  #Mid-March 2020 started on crizotinib  Bilateral lower extremity edema, right  >left, Ultrasound venous right 09/20/2018 no DVT.  2D echo 10/10/2018 showed LVEF 55 to 60% Most likely secondary to crizotinib side effects.  # 12/15/2018 CT chest images were independently reviewed and discussed with patient. Dominant left upper lobe nodule and multiple scattered irregular pulmonary parenchymal nodular lesions are stable. Mild basilar pulmonary parenchymal septal thickening is new and can be seen with pulmonary edema. Patient reports no shortness of breath asymptomatic  #12/29/2018 unilateral left diagnostic mammogram with ultrasound images were independently reviewed and discussed with patient. Slight interval reduction of the size of the left retroareolar breast mass, 9 x 6 x 7 mm, comparing to 10 x 9 x 8 mm. Axillary lymph node measured 1.3 x 1.0 x 1.0 cm, previously 2.2 x 1.4 x 1.5 cm,  there were 2 other abdominal lymph nodes in the left axilla, which were not reported in previous ultrasound. Discussed with patient that overall, breast cancer responded to endocrine treatments.  Additional 2 lymph nodes need to be closely monitored.  Recommend patient to continue Arimidex.   INTERVAL HISTORY Jamie Matthews is a 79 y.o. female who has above history reviewed by me today presents for follow up visit for evaluation of tolerability of treatment for breast cancer and Stage IV lung cancer. Patient reports doing well. She has had multiple fall episodes.  Recently had another fall and hurt her right side. Patient was seen by primary care provider and rib x-ray was ordered today She got x-ray done just before coming to see me. She was advised to take Tylenol for pain. Patient was being referred to physical therapy for prevention program .  Patient takes Arimidex and tolerates well. She also is on crizotinib and tolerates well.  Chronic  bilateral lower extremity edema.  She uses compression stocking. .   Review of Systems  Constitutional: Positive for fatigue.  Negative for appetite change, chills and fever.  HENT:   Negative for hearing loss and voice change.   Eyes: Negative for eye problems.  Respiratory: Negative for chest tightness and cough.   Cardiovascular: Positive for leg swelling. Negative for chest pain.  Gastrointestinal: Negative for abdominal distention, abdominal pain and blood in stool.  Endocrine: Negative for hot flashes.  Genitourinary: Negative for difficulty urinating and frequency.   Musculoskeletal: Negative for arthralgias.  Skin: Negative for itching and rash.  Neurological: Negative for extremity weakness.       Had a fall recently  Hematological: Negative for adenopathy.  Psychiatric/Behavioral: Negative for confusion and sleep disturbance.    MEDICAL HISTORY:  Past Medical History:  Diagnosis Date  . AKI (acute kidney injury) ([place]) 08/17/2018  . Anxiety   . Arthritis   . Belching   . Bladder disorder    OVERACTIVE  . Bowel dysfunction    BLOCKAGE  . Cancer (HCC)    breast  . Constipation   . Depression   . Diverticulitis   . Fibromyalgia   . GERD (gastroesophageal reflux disease)   . Hyperlipidemia   . IBS (irritable bowel syndrome)   . Internal hemorrhoids   . Lung cancer ([place]) 2020  . Memory deficits   . Murmur    asymptomatic  . Pneumonia 11/18/12  . Urinary incontinence   . Vertigo     SURGICAL HISTORY: Past Surgical History:  Procedure Laterality Date  . AXILLARY LYMPH NODE BIOPSY Left 06/08/2018   INVASIVE MAMMARY CARCINOMA  . BLADDER SUSPENSION  2004, 2012  . BREAST BIOPSY Left 06/08/2018   INVASIVE MAMMARY CARCINOMA  . CATARACT EXTRACTION W/PHACO Right 08/27/2015   Procedure: CATARACT EXTRACTION PHACO AND INTRAOCULAR LENS PLACEMENT (IOC);  Surgeon: Estill Cotta, MD;  Location: [facility] ORS;  Service: Ophthalmology;  Laterality: Right;  [place]   1:00.2 AP%  22.5 CDE  23.67 fluid casette lot #[id number]  exp05/31/2018  . CATARACT EXTRACTION W/PHACO Left 10/15/2015   Procedure: CATARACT  EXTRACTION PHACO AND INTRAOCULAR LENS PLACEMENT (IOC);  Surgeon: Estill Cotta, MD;  Location: [facility] ORS;  Service: Ophthalmology;  Laterality: Left;  [place] 01:07 AP% 18.1 CDE 21.57 fluid pack lot # [id number]  . CHOLECYSTECTOMY    . COLONOSCOPY  2017  . ELECTROMAGNETIC NAVIGATION BROCHOSCOPY N/A 07/09/2018   Procedure: ELECTROMAGNETIC NAVIGATION BRONCHOSCOPY;  Surgeon: Flora Lipps, MD;  Location: [facility] ORS;  Service: Cardiopulmonary;  Laterality: N/A;  . TONSILLECTOMY  1947    SOCIAL HISTORY: Social History   Socioeconomic History  . Marital status: Married    Spouse name: Jamie Alm  . Number of children: 0  . Years of education: Master's Degree  . Highest education level: Not on file  Occupational History  . Occupation: Retired  Tobacco Use  . Smoking status: Never Smoker  . Smokeless tobacco: Never Used  Substance and Sexual Activity  . Alcohol use: Yes    Comment: rare wine  . Drug use: No  . Sexual activity: Not Currently  Other Topics Concern  . Not on file  Social History Narrative   Recently moved with her husband to [place] from [place].   Husband is a retired Pharmacist, community.   No children.   She is a retired Pharmacist, hospital.   Enjoys: Chief Technology Officer, reading - mysteries and biographies, cooking   Exercise: walking, gardening   Diet: low appetite due to cancer  treatment, grazing   She is a DNR.   Social Determinants of Health   Financial Resource Strain: Low Risk   . Difficulty of Paying Living Expenses: Not hard at all  Food Insecurity:   . Worried About Charity fundraiser in the Last Year: Not on file  . Ran Out of Food in the Last Year: Not on file  Transportation Needs:   . Lack of Transportation (Medical): Not on file  . Lack of Transportation (Non-Medical): Not on file  Physical Activity:   . Days of Exercise per Week: Not on file  . Minutes of Exercise per Session: Not on file  Stress:   . Feeling of Stress : Not on file  Social Connections:   . Frequency of  Communication with Friends and Family: Not on file  . Frequency of Social Gatherings with Friends and Family: Not on file  . Attends Religious Services: Not on file  . Active Member of Clubs or Organizations: Not on file  . Attends Archivist Meetings: Not on file  . Marital Status: Not on file  Intimate Partner Violence:   . Fear of Current or Ex-Partner: Not on file  . Emotionally Abused: Not on file  . Physically Abused: Not on file  . Sexually Abused: Not on file    FAMILY HISTORY: Family History  Problem Relation Age of Onset  . Diabetes Father   . Heart disease Father   . Lymphoma Father   . Heart disease Mother   . Breast cancer Sister 37  . Colon cancer Neg Hx   . Esophageal cancer Neg Hx   . Rectal cancer Neg Hx   . Stomach cancer Neg Hx   . Bladder Cancer Neg Hx   . Kidney cancer Neg Hx     ALLERGIES:  is allergic to sulfa antibiotics.  MEDICATIONS:  Current Outpatient Medications  Medication Sig Dispense Refill  . acetaminophen (TYLENOL) 325 MG tablet Take 325 mg by mouth every 6 (six) hours as needed for mild pain.     [name] anastrozole (ARIMIDEX) 1 MG tablet TAKE 1 TABLET BY MOUTH DAILY 30 tablet 3  . atorvastatin (LIPITOR) 20 MG tablet Take 1 tablet (20 mg total) by mouth at bedtime. 90 tablet 3  . benzonatate (TESSALON) 100 MG capsule Take 1 capsule (100 mg total) by mouth 3 (three) times daily as needed for cough. 60 capsule 0  . buPROPion (WELLBUTRIN XL) 150 MG 24 hr tablet TAKE ONE TABLET BY MOUTH EVERY DAY 90 tablet 2  . Calcium-Magnesium-Vitamin D (CALCIUM 1200+D3 PO) Take 1 tablet by mouth daily.    [name] desonide (DESOWEN) 0.05 % lotion Apply 1 application topically as needed (after showering).     . Dexlansoprazole (DEXILANT) 30 MG capsule Take 1 capsule (30 mg total) by mouth daily. 90 capsule 3  . Dextromethorphan-guaiFENesin (MUCINEX DM) 30-600 MG TB12 Take 1 tablet by mouth 2 (two) times daily as needed (for congestion/cough).    [name] ketoconazole  (NIZORAL) 2 % shampoo Apply 1 application topically. 3 times a week    . memantine (NAMENDA) 10 MG tablet TAKE ONE TABLET BY MOUTH TWICE DAILY 180 tablet 2  . Multiple Vitamin (MULTIVITAMIN WITH MINERALS) TABS tablet Take 1 tablet by mouth daily.    [name] MYRBETRIQ 50 MG TB24 tablet TAKE ONE TABLET BY MOUTH EVERY DAY 90 tablet 1  . ondansetron (ZOFRAN) 8 MG tablet Take 8 mg by mouth every 8 (eight) hours as needed for nausea.    [name]  polyethylene glycol (MIRALAX / GLYCOLAX) packet Take 17 g by mouth daily.    . sucralfate (CARAFATE) 1 g tablet Take 1 tablet (1 g total) by mouth 2 (two) times daily. (Patient taking differently: Take 1 g by mouth 2 (two) times daily. prn) 60 tablet 0  . venlafaxine XR (EFFEXOR-XR) 150 MG 24 hr capsule TAKE 1 CAPSULE BY MOUTH EVERY DAY 90 capsule 1  . XALKORI 250 MG capsule TAKE 1 CAPSULE (250 MG TOTAL) BY MOUTH 2 TIMES DAILY. 60 capsule 2  . fluticasone (FLONASE) 50 MCG/ACT nasal spray Place 2 sprays into both nostrils daily. (Patient not taking: Reported on 07/15/2019) 16 g 6  . lubiprostone (AMITIZA) 24 MCG capsule TAKE ONE CAPSULE TWICE A DAY WITH MEALS (Patient not taking: Reported on 07/15/2019) 60 capsule 3  . senna (SENOKOT) 8.6 MG TABS tablet Take 2 tablets (17.2 mg total) by mouth daily. Stop Colace (Patient not taking: Reported on 07/15/2019) 60 tablet 0  . triamcinolone (NASACORT) 55 MCG/ACT AERO nasal inhaler Place 2 sprays into the nose daily as needed (for congestion).     [name] zolpidem (AMBIEN) 5 MG tablet TAKE ONE TABLET AT BEDTIME AS NEEDED FORSLEEP (Patient not taking: Reported on 07/15/2019) 30 tablet 0   No current facility-administered medications for this visit.     PHYSICAL EXAMINATION: ECOG PERFORMANCE STATUS: 2 - Symptomatic, <50% confined to bed Vitals:   07/15/19 1311  BP: (!) 118/57  Pulse: (!) 48  Resp: 16  Temp: (!) 97.4 F (36.3 C)   Filed Weights   07/15/19 1311  Weight: 144 lb (65.3 kg)    Physical Exam  Constitutional: She is  oriented to person, place, and time. No distress.  Frail female, walks with a walker  HENT:  Head: Normocephalic and atraumatic.  Nose: Nose normal.  Mouth/Throat: Oropharynx is clear and moist. No oropharyngeal exudate.  Eyes: Pupils are equal, round, and reactive to light. EOM are normal. No scleral icterus.  Cardiovascular: Normal rate and regular rhythm.  No murmur heard. Pulmonary/Chest: Effort normal. No respiratory distress. She exhibits tenderness.  Right anterior chest wall tenderness with palpation.  Abdominal: Soft. She exhibits no distension. There is no abdominal tenderness.  Musculoskeletal:        General: Edema present. Normal range of motion.     Cervical back: Normal range of motion and neck supple.     Comments: Bilateral lower extremity 3+ edema, right worse than left.   Neurological: She is alert and oriented to person, place, and time. No cranial nerve deficit. She exhibits normal muscle tone. Coordination normal.  Skin: Skin is warm and dry. She is not diaphoretic. No erythema.  Psychiatric: Affect normal.     CMP Latest Ref Rng & Units 07/15/2019  Glucose 70 - 99 mg/dL 100(H)  BUN 8 - 23 mg/dL 24(H)  Creatinine 0.44 - 1.00 mg/dL 1.03(H)  Sodium 135 - 145 mmol/L 141  Potassium 3.5 - 5.1 mmol/L 4.3  Chloride 98 - 111 mmol/L 105  CO2 22 - 32 mmol/L 28  Calcium 8.9 - 10.3 mg/dL 8.3(L)  Total Protein 6.5 - 8.1 g/dL 6.0(L)  Total Bilirubin 0.3 - 1.2 mg/dL 0.5  Alkaline Phos 38 - 126 U/L 123  AST 15 - 41 U/L 25  ALT 0 - 44 U/L 32   CBC Latest Ref Rng & Units 07/15/2019  WBC 4.0 - 10.5 K/uL 4.2  Hemoglobin 12.0 - 15.0 g/dL 10.5(L)  Hematocrit 36.0 - 46.0 % 33.9(L)  Platelets 150 - 400  K/uL 347    LABORATORY DATA:  I have reviewed the data as listed Lab Results  Component Value Date   WBC 4.2 07/15/2019   HGB 10.5 (L) 07/15/2019   HCT 33.9 (L) 07/15/2019   MCV 101.8 (H) 07/15/2019   PLT 347 07/15/2019   Recent Labs    06/10/19 1118 06/29/19 1349  07/15/19 1252  NA 137 139 141  K 3.9 4.0 4.3  CL 100 107 105  CO2 _0 GLUCOSE 105* 120* 100*  BUN 22 21 24*  CREATININE 1.22* 1.17* 1.03*  CALCIUM 8.2* 7.7* 8.3*  GFRNONAA 42* 44* 51*  GFRAA 48* 51* 59*  PROT 6.2* 5.4* 6.0*  ALBUMIN 3.1* 2.7* 2.9*  AST 32 34 25  ALT 34 37 32  ALKPHOS 120 115 123  BILITOT 0.5 0.4 0.5   Iron/TIBC/Ferritin/ %Sat No results found for: IRON, TIBC, FERRITIN, IRONPCTSAT   RADIOGRAPHIC STUDIES: I have personally reviewed the radiological images as listed and agreed with the findings in the report. DG Chest 2 View  Result Date: 06/14/2019 CLINICAL DATA:  Right-sided rib pain secondary to a fall. This EXAM: CHEST - 2 VIEW COMPARISON:  Chest x-ray dated 10/10/2018 and chest CT dated 03/23/2019 FINDINGS: There is a slightly displaced fracture of the lateral aspect of the right sixth rib. No pneumothorax or lung contusion or pleural effusion. The heart size and pulmonary vascularity are normal. Aortic atherosclerosis. There is a poorly defined 2.4 cm mass in the left upper lobe consistent with the previously demonstrated cancer on the chest CT of 03/23/2019. Calcified granulomas in the left hemithorax. IMPRESSION: 1. Fracture of the lateral aspect of the right sixth rib. No other acute abnormalities. 2. Left upper lobe lung mass as previously demonstrated. 3.  Aortic Atherosclerosis (ICD10-I70.0). Electronically Signed   By: Lorriane Shire M.D.   On: 06/14/2019 15:43   DG Ribs Unilateral W/Chest Right  Result Date: 07/15/2019 CLINICAL DATA:  Fall 1 week ago.  Right chest wall pain. EXAM: RIGHT RIBS AND CHEST - 3+ VIEW COMPARISON:  06/14/2019 FINDINGS: Multiple right rib fractures. Mildly displaced fracture of the lateral right sixth rib is unchanged compared to the prior chest radiograph. Posterior fractures of the right third and fourth ribs appear new since the prior radiographs. There is a fracture across the posterolateral right seventh rib, and there are  recent appearing fractures of the anterolateral right eighth and ninth ribs. No bone lesions. Cardiac silhouette is normal in size. No mediastinal or hilar masses. Mild opacity at the right lung base is likely atelectasis. Stable granuloma noted in the left upper lobe. Lungs are otherwise clear. No pneumothorax. IMPRESSION: 1. Multiple rib fractures as described, with fractures of the posterolateral seventh, anterolateral eighth and ninth, and posterior third and fourth ribs appearing recent and new since the prior chest radiograph. 2. Mild opacity at the right lung base is likely atelectasis. 3. No pneumothorax. Electronically Signed   By: Lajean Manes M.D.   On: 07/15/2019 13:30   NM PET Image Restag (PS) Skull Base To Thigh  Result Date: 05/10/2019 CLINICAL DATA:  Subsequent treatment strategy for lung cancer, breast cancer. Fall 2 weeks ago. EXAM: NUCLEAR MEDICINE PET SKULL BASE TO THIGH TECHNIQUE: 8.0 mCi F-18 FDG was injected intravenously. Full-ring PET imaging was performed from the skull base to thigh after the radiotracer. CT data was obtained and used for attenuation correction and anatomic localization. Fasting blood glucose: 94 mg/dl COMPARISON:  CT chest dated 03/23/2019.  PET-CT dated  06/21/2018. FINDINGS: Mediastinal blood pool activity: SUV max 2.3 Liver activity: SUV max NA NECK: No hypermetabolic cervical lymphadenopathy. Incidental CT findings: none CHEST: 1.6 x 2.1 cm left apical nodule (series 3/image 34), max SUV 2.5. This previously measured 1.8 x 2.1 cm on recent CT and demonstrated max SUV 7.4 on prior PET. Improving small bilateral pulmonary nodules, including a dominant 3 mm nodule in the medial right upper lobe, now FDG avid. This previously measured 5 mm and demonstrated mild hypermetabolism. Persistent hypermetabolism in the bilateral perihilar regions, including max SUV 4.5 in the left suprahilar region, which may correspond to a 9 mm AP window node (series 3/image 98). On prior  PET, max SUV in this region was 11.1. Mild residual hypermetabolism in the right infrahilar region/intralobar right lower lobe, max SUV 3.3, previously 10.4. Very mild sub areolar soft tissue nodularity (series 3/image 37), although without residual hypermetabolism. 9 mm short axis left axillary node (series 3/image 80), previously 10 mm on recent CT, without residual hypermetabolism (max SUV 1.0), previously max SUV 5.6 on prior PET. Incidental CT findings: Mild atherosclerotic calcifications of the aortic arch. Three vessel coronary atherosclerosis. ABDOMEN/PELVIS: No hypermetabolic abdominopelvic lymphadenopathy. No abnormal hypermetabolism in the liver, spleen, pancreas, or adrenal glands. Incidental CT findings: Status post cholecystectomy. Atherosclerotic calcifications the abdominal aorta and branch vessels. Thick-walled bladder. Moderate colonic stool burden, suggesting constipation. SKELETON: No focal hypermetabolic activity to suggest skeletal metastasis. Incidental CT findings: Nondisplaced right lateral 8th and 9th rib fractures (series 3/images 142 and 149), max SUV 3.0, likely posttraumatic. Healing right superior and inferior pubic rami fractures (for example, series 3/image 244), non FDG avid. IMPRESSION: Improving left apical nodule, corresponding to the patient's known primary bronchogenic neoplasm. Near complete resolution of known pulmonary metastases. Improving hilar/perihilar nodal metastases. Prior subareolar left breast mass has essentially resolved. Small left axillary nodal metastasis, improved, although non FDG avid. Nondisplaced right lateral 8th and 9th rib fractures, likely related to recent fall. Healing right superior and inferior pubic rami fractures, old. Electronically Signed   By: Julian Hy M.D.   On: 05/10/2019 14:17      ASSESSMENT & PLAN:  1. Malignant neoplasm of left female breast, unspecified estrogen receptor status, unspecified site of breast ([place])   2.  Primary adenocarcinoma of lung, unspecified laterality ([place])   3. Osteopenia, unspecified location   4. Closed fracture of multiple ribs of right side, initial encounter   Cancer Staging Malignant neoplasm of left female breast [facility]) Staging form: Breast, AJCC 8th Edition - Clinical stage from 12/17/2018: Stage IB (cT1b, cN1, cM0, G1, ER+, PR+, HER2-) - Signed by Earlie Server, MD on 12/17/2018  Primary adenocarcinoma of lung [facility]) Staging form: Lung, AJCC 8th Edition - Clinical stage from 09/28/2018: Stage IVA (cT2, cN3, cM1a) - Signed by Earlie Server, MD on 09/28/2018   Patient has 2 primaries.   Stage IV lung adenocarcinoma, met fusion mutation cT2 N3 M1a PDL 1 TPS more than 70%  Patient is clinically doing well.  PET scan showed very good partial response. Continue crizotinib.  Labs reviewed and discussed with patient.   #Breast cancer, responding to Arimidex.   Continue Arimidex 1 mg daily.  She tolerates well.  #Frequent falls, patient has been referred to physical therapy for prevention program. #Rib fracture secondary to trauma Patient's x-ray results are pending and I called [facility] radiology and had a prelim results. Patient had multiple rib fractures-continue Tylenol as instructed for pain.  #Osteopenia, we have received a dental clearance and  patient is supposed to start Xgeva treatments. Xgeva need to be held due to acute rib fracture.  Will allow rib fracture to heal and will proceed with Xgeva at the next visit  All questions were answered. The patient knows to call the clinic with any problems questions or concerns. Return of visit: 4 weeks.   Earlie Server, MD, PhD 07/15/2019

## 2019-07-18 ENCOUNTER — Telehealth: Payer: Self-pay | Admitting: Family Medicine

## 2019-07-18 NOTE — Telephone Encounter (Signed)
Pt was seen in the office on Friday.  Xray showed multiple R sided rib fractures plz call for update on pain, symptoms, see if scheduled tylenol is controlling pain adequately.

## 2019-07-18 NOTE — Telephone Encounter (Signed)
Spoke with pt asking for update on pain.  Says she got my vm about xray results.  State she takes the Tylenol at night to help with the pain but nothing during the day.  Denies any pain when breathing and is careful when she has to move about.

## 2019-07-19 ENCOUNTER — Telehealth: Payer: Self-pay | Admitting: *Deleted

## 2019-07-19 NOTE — Telephone Encounter (Signed)
Patient called asking for the dose of Vit D and Ca+ she is supposed to take, she states she has a MVI with 25 mg Vit D and 200 of Calcium in it and wants to know if this is enough. Please advise

## 2019-07-19 NOTE — Telephone Encounter (Signed)
Previously advised (on 2/3 phone note) to start taking Calcium 1200 mg QD.  Dr. Tasia Catchings, how much Vitamin D would you recommend?

## 2019-07-20 NOTE — Telephone Encounter (Signed)
Left message informing patient:  Dr. Tasia Catchings suggests she take total of Vit D 800 and Calcium 1200 QD

## 2019-07-25 DIAGNOSIS — H16223 Keratoconjunctivitis sicca, not specified as Sjogren's, bilateral: Secondary | ICD-10-CM | POA: Diagnosis not present

## 2019-07-26 ENCOUNTER — Other Ambulatory Visit: Payer: Self-pay | Admitting: Oncology

## 2019-07-28 DIAGNOSIS — M6281 Muscle weakness (generalized): Secondary | ICD-10-CM | POA: Diagnosis not present

## 2019-07-28 DIAGNOSIS — R278 Other lack of coordination: Secondary | ICD-10-CM | POA: Diagnosis not present

## 2019-07-28 DIAGNOSIS — R0781 Pleurodynia: Secondary | ICD-10-CM | POA: Diagnosis not present

## 2019-07-28 DIAGNOSIS — W19XXXD Unspecified fall, subsequent encounter: Secondary | ICD-10-CM | POA: Diagnosis not present

## 2019-07-28 DIAGNOSIS — R2689 Other abnormalities of gait and mobility: Secondary | ICD-10-CM | POA: Diagnosis not present

## 2019-08-05 ENCOUNTER — Inpatient Hospital Stay: Payer: Medicare Other | Admitting: Hospice and Palliative Medicine

## 2019-08-05 DIAGNOSIS — Z23 Encounter for immunization: Secondary | ICD-10-CM | POA: Diagnosis not present

## 2019-08-05 IMAGING — DX PORTABLE CHEST - 1 VIEW
1 series · 1 of 1 positions shown · non-contrast
Comparison: 08/23/2018

CLINICAL DATA: Fever

EXAM:
PORTABLE CHEST 1 VIEW

[chest ap]
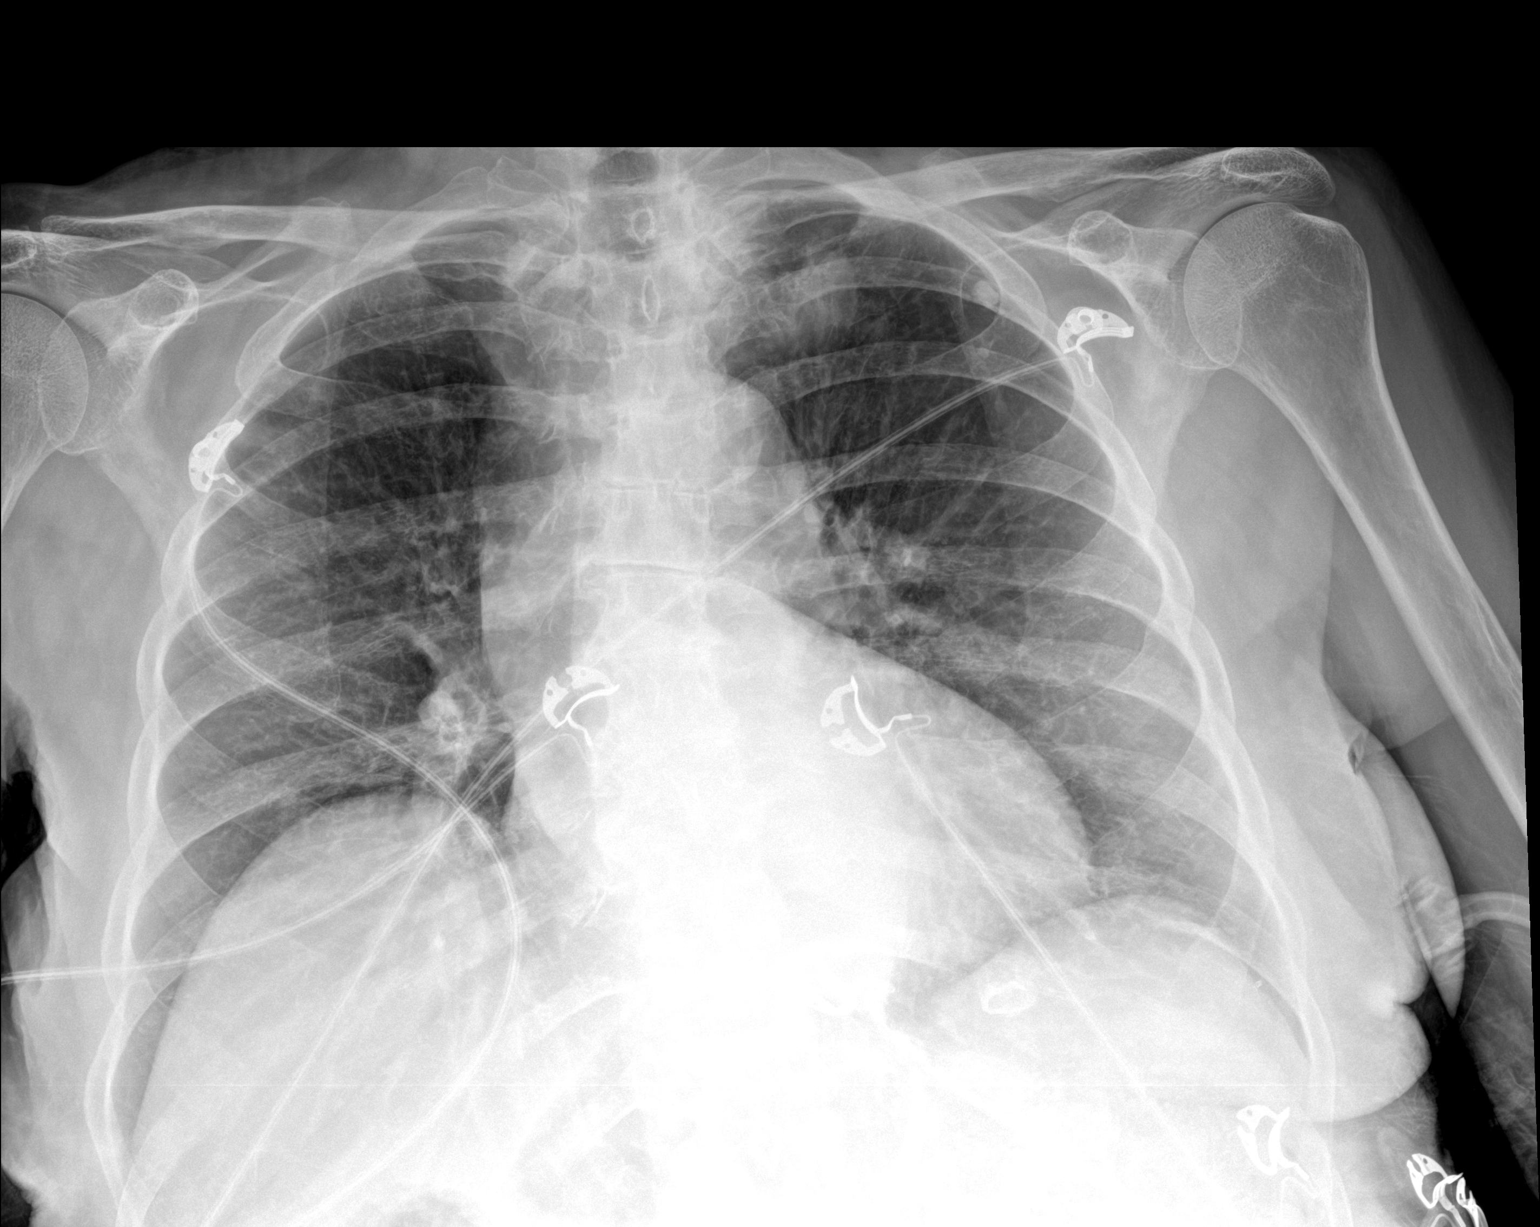

[1 of 1 positions shown; findings below may reference images not displayed]

FINDINGS: The heart size and mediastinal contours are within normal limits.
Both lungs are clear. Calcification projecting over the left apex is
unchanged. The visualized skeletal structures are unremarkable.
IMPRESSION: No active disease.

## 2019-08-10 ENCOUNTER — Other Ambulatory Visit: Payer: Self-pay

## 2019-08-10 DIAGNOSIS — C50912 Malignant neoplasm of unspecified site of left female breast: Secondary | ICD-10-CM

## 2019-08-10 MED FILL — XALKORI 250 MG CAPSULE: 250 | 30 days supply | Qty: 60 | Fill #2

## 2019-08-12 ENCOUNTER — Inpatient Hospital Stay: Payer: Medicare Other | Attending: Oncology

## 2019-08-12 ENCOUNTER — Inpatient Hospital Stay: Payer: Medicare Other

## 2019-08-12 ENCOUNTER — Inpatient Hospital Stay (HOSPITAL_BASED_OUTPATIENT_CLINIC_OR_DEPARTMENT_OTHER): Payer: Medicare Other | Admitting: Oncology

## 2019-08-12 ENCOUNTER — Encounter: Payer: Self-pay | Admitting: Oncology

## 2019-08-12 VITALS — BP 126/63 | HR 54 | Temp 96.9°F | Resp 16 | Wt 148.0 lb

## 2019-08-12 DIAGNOSIS — M1990 Unspecified osteoarthritis, unspecified site: Secondary | ICD-10-CM | POA: Diagnosis not present

## 2019-08-12 DIAGNOSIS — Z8719 Personal history of other diseases of the digestive system: Secondary | ICD-10-CM | POA: Diagnosis not present

## 2019-08-12 DIAGNOSIS — Z8249 Family history of ischemic heart disease and other diseases of the circulatory system: Secondary | ICD-10-CM | POA: Diagnosis not present

## 2019-08-12 DIAGNOSIS — C3490 Malignant neoplasm of unspecified part of unspecified bronchus or lung: Secondary | ICD-10-CM | POA: Diagnosis not present

## 2019-08-12 DIAGNOSIS — R062 Wheezing: Secondary | ICD-10-CM | POA: Diagnosis not present

## 2019-08-12 DIAGNOSIS — Z882 Allergy status to sulfonamides status: Secondary | ICD-10-CM | POA: Diagnosis not present

## 2019-08-12 DIAGNOSIS — Z9181 History of falling: Secondary | ICD-10-CM | POA: Insufficient documentation

## 2019-08-12 DIAGNOSIS — C3412 Malignant neoplasm of upper lobe, left bronchus or lung: Secondary | ICD-10-CM | POA: Diagnosis not present

## 2019-08-12 DIAGNOSIS — Z79811 Long term (current) use of aromatase inhibitors: Secondary | ICD-10-CM | POA: Insufficient documentation

## 2019-08-12 DIAGNOSIS — Z833 Family history of diabetes mellitus: Secondary | ICD-10-CM | POA: Diagnosis not present

## 2019-08-12 DIAGNOSIS — M8580 Other specified disorders of bone density and structure, unspecified site: Secondary | ICD-10-CM | POA: Insufficient documentation

## 2019-08-12 DIAGNOSIS — Z79899 Other long term (current) drug therapy: Secondary | ICD-10-CM | POA: Insufficient documentation

## 2019-08-12 DIAGNOSIS — R05 Cough: Secondary | ICD-10-CM | POA: Diagnosis not present

## 2019-08-12 DIAGNOSIS — R0789 Other chest pain: Secondary | ICD-10-CM | POA: Insufficient documentation

## 2019-08-12 DIAGNOSIS — R600 Localized edema: Secondary | ICD-10-CM | POA: Diagnosis not present

## 2019-08-12 DIAGNOSIS — C50912 Malignant neoplasm of unspecified site of left female breast: Secondary | ICD-10-CM | POA: Diagnosis not present

## 2019-08-12 DIAGNOSIS — Z803 Family history of malignant neoplasm of breast: Secondary | ICD-10-CM | POA: Insufficient documentation

## 2019-08-12 DIAGNOSIS — C773 Secondary and unspecified malignant neoplasm of axilla and upper limb lymph nodes: Secondary | ICD-10-CM | POA: Insufficient documentation

## 2019-08-12 DIAGNOSIS — R5383 Other fatigue: Secondary | ICD-10-CM | POA: Insufficient documentation

## 2019-08-12 DIAGNOSIS — Z5111 Encounter for antineoplastic chemotherapy: Secondary | ICD-10-CM

## 2019-08-12 DIAGNOSIS — C50012 Malignant neoplasm of nipple and areola, left female breast: Secondary | ICD-10-CM | POA: Diagnosis not present

## 2019-08-12 DIAGNOSIS — Z170 Estrogen receptor positive status [ER+]: Secondary | ICD-10-CM | POA: Insufficient documentation

## 2019-08-12 DIAGNOSIS — M7989 Other specified soft tissue disorders: Secondary | ICD-10-CM | POA: Diagnosis not present

## 2019-08-12 DIAGNOSIS — K589 Irritable bowel syndrome without diarrhea: Secondary | ICD-10-CM | POA: Insufficient documentation

## 2019-08-12 DIAGNOSIS — N179 Acute kidney failure, unspecified: Secondary | ICD-10-CM

## 2019-08-12 DIAGNOSIS — I700 Atherosclerosis of aorta: Secondary | ICD-10-CM | POA: Insufficient documentation

## 2019-08-12 LAB — CBC WITH DIFFERENTIAL/PLATELET
Abs Immature Granulocytes: 0.04 10*3/uL (ref 0.00–0.07)
Basophils Absolute: 0 10*3/uL (ref 0.0–0.1)
Basophils Relative: 1 %
Eosinophils Absolute: 0.5 10*3/uL (ref 0.0–0.5)
Eosinophils Relative: 8 %
HCT: 33.6 % — ABNORMAL LOW (ref 36.0–46.0)
Hemoglobin: 10.8 g/dL — ABNORMAL LOW (ref 12.0–15.0)
Immature Granulocytes: 1 %
Lymphocytes Relative: 33 %
Lymphs Abs: 1.9 10*3/uL (ref 0.7–4.0)
MCH: 32.1 pg (ref 26.0–34.0)
MCHC: 32.1 g/dL (ref 30.0–36.0)
MCV: 100 fL (ref 80.0–100.0)
Monocytes Absolute: 1.4 10*3/uL — ABNORMAL HIGH (ref 0.1–1.0)
Monocytes Relative: 25 %
Neutro Abs: 1.9 10*3/uL (ref 1.7–7.7)
Neutrophils Relative %: 32 %
Platelets: 325 10*3/uL (ref 150–400)
RBC: 3.36 MIL/uL — ABNORMAL LOW (ref 3.87–5.11)
RDW: 13.8 % (ref 11.5–15.5)
WBC: 5.7 10*3/uL (ref 4.0–10.5)
nRBC: 0 % (ref 0.0–0.2)

## 2019-08-12 LAB — COMPREHENSIVE METABOLIC PANEL
ALT: 35 U/L (ref 0–44)
AST: 31 U/L (ref 15–41)
Albumin: 3.1 g/dL — ABNORMAL LOW (ref 3.5–5.0)
Alkaline Phosphatase: 133 U/L — ABNORMAL HIGH (ref 38–126)
Anion gap: 9 (ref 5–15)
BUN: 23 mg/dL (ref 8–23)
CO2: 25 mmol/L (ref 22–32)
Calcium: 8.1 mg/dL — ABNORMAL LOW (ref 8.9–10.3)
Chloride: 102 mmol/L (ref 98–111)
Creatinine, Ser: 1.08 mg/dL — ABNORMAL HIGH (ref 0.44–1.00)
GFR calc Af Amer: 56 mL/min — ABNORMAL LOW (ref >60)
GFR calc non Af Amer: 48 mL/min — ABNORMAL LOW (ref >60)
Glucose, Bld: 97 mg/dL (ref 70–99)
Potassium: 4 mmol/L (ref 3.5–5.1)
Sodium: 136 mmol/L (ref 135–145)
Total Bilirubin: 0.7 mg/dL (ref 0.3–1.2)
Total Protein: 6 g/dL — ABNORMAL LOW (ref 6.5–8.1)

## 2019-08-12 MED ORDER — DENOSUMAB 120 MG/1.7ML ~~LOC~~ SOLN
120.0000 mg | Freq: Once | SUBCUTANEOUS | Status: AC
  Administered 2019-08-12: 120 mg via SUBCUTANEOUS
  Filled 2019-08-12: qty 1.7

## 2019-08-12 NOTE — Progress Notes (Signed)
Wheezing for the past couple of weeks that is worse with lying down.

## 2019-08-13 NOTE — Progress Notes (Signed)
Hematology/Oncology follow up note [facility] Telephone:([phone]) [phone] Fax:([phone]) [phone]   Patient Care Team: [name], MD as PCP - General (Family Medicine) Osborne Oman, MD as Consulting Physician (Obstetrics and Gynecology) Pyrtle, Lajuan Lines, MD as Consulting Physician (Gastroenterology) Telford Nab, RN as Registered Nurse Earlie Server, MD as Consulting Physician (Hematology and Oncology)   REASON FOR VISIT:  Follow up for management of lung cancer and breast cancer  HISTORY OF PRESENTING ILLNESS:  Jamie Matthews is a  79 y.o.  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 05/05/2018 bilateral diagnostic breast mammogram showed suspicious mass 1.1cm  in the 12:00 retroareolar region of the left breast and the left axillary lymph node. 06/08/2018 patient status post a left breast retroareolar and left axillary lymph node biopsy. Pathology showed invasive mammary carcinoma, no special type, grade 1, left axillary lymph node positive for invasive mammary carcinoma clinically metastatic.  Background lymph node architecture is not identified. ER> 90% PR> 90%, HER-2 negative.  PET scan done which unfortunately showed additional hypermetabolic bilateral hilar lymph nodes as well as left upper lobe lung mass which may represent a focus of metastatic disease from breast, or primary lung neoplasm.  There is multiple additional small pulmonary nodules are scattered throughout both lungs which are worrisome for metastasis.  Index nodule within the medial right upper lobe measures 7 mm,, peri-broncho-vascular nodule in the right lower lobe measures 1.2 cm.  # Stage IV lung cancer-  Biopsy of lung mass left upper lobe showed non-small cell lung cancer, favor adenocarcinoma.  #NGS came back patient has PD L1 is 70% TPS, MET fusion mutation.  #Mid-March 2020 started on crizotinib  Bilateral lower extremity edema, right  >left, Ultrasound venous right 09/20/2018 no DVT.  2D echo 10/10/2018 showed LVEF 55 to 60% Most likely secondary to crizotinib side effects.  # 12/15/2018 CT chest images were independently reviewed and discussed with patient. Dominant left upper lobe nodule and multiple scattered irregular pulmonary parenchymal nodular lesions are stable. Mild basilar pulmonary parenchymal septal thickening is new and can be seen with pulmonary edema. Patient reports no shortness of breath asymptomatic  #12/29/2018 unilateral left diagnostic mammogram with ultrasound images were independently reviewed and discussed with patient. Slight interval reduction of the size of the left retroareolar breast mass, 9 x 6 x 7 mm, comparing to 10 x 9 x 8 mm. Axillary lymph node measured 1.3 x 1.0 x 1.0 cm, previously 2.2 x 1.4 x 1.5 cm,  there were 2 other abdominal lymph nodes in the left axilla, which were not reported in previous ultrasound. Discussed with patient that overall, breast cancer responded to endocrine treatments.  Additional 2 lymph nodes need to be closely monitored.  Recommend patient to continue Arimidex.   INTERVAL HISTORY Jamie Matthews is a 79 y.o. female who has above history reviewed by me today presents for follow up visit for evaluation of tolerability of treatment for breast cancer and Stage IV lung cancer. Patient reports doing well. Some cough and wheezing. Cough is non productive.  She takes Crizotinib and tolerates well.  Chronic lower extremity edema She uses compression stocking. Also takes Arimidex and tolerates well.  .   .   Review of Systems  Constitutional: Positive for fatigue. Negative for appetite change, chills and fever.  HENT:   Negative for hearing loss and voice change.   Eyes: Negative for eye problems.  Respiratory: Positive for cough.  Negative for chest tightness.   Cardiovascular: Positive for leg swelling. Negative for chest pain.  Gastrointestinal:  Negative for abdominal distention, abdominal pain and blood in stool.  Endocrine: Negative for hot flashes.  Genitourinary: Negative for difficulty urinating and frequency.   Musculoskeletal: Negative for arthralgias.  Skin: Negative for itching and rash.  Neurological: Negative for extremity weakness.  Hematological: Negative for adenopathy.  Psychiatric/Behavioral: Negative for confusion and sleep disturbance.    MEDICAL HISTORY:  Past Medical History:  Diagnosis Date  . AKI (acute kidney injury) ([place]) 08/17/2018  . Anxiety   . Arthritis   . Belching   . Bladder disorder    OVERACTIVE  . Bowel dysfunction    BLOCKAGE  . Cancer (H[facility])    breast  . Constipation   . Depression   . Diverticulitis   . Fibromyalgia   . GERD (gastroesophageal reflux disease)   . Hyperlipidemia   . IBS (irritable bowel syndrome)   . Internal hemorrhoids   . Lung cancer ([place]) 2020  . Memory deficits   . Murmur    asymptomatic  . Pneumonia 11/18/12  . Urinary incontinence   . Vertigo     SURGICAL HISTORY: Past Surgical History:  Procedure Laterality Date  . AXILLARY LYMPH NODE BIOPSY Left 06/08/2018   INVASIVE MAMMARY CARCINOMA  . BLADDER SUSPENSION  2004, 2012  . BREAST BIOPSY Left 06/08/2018   INVASIVE MAMMARY CARCINOMA  . CATARACT EXTRACTION W/PHACO Right 08/27/2015   Procedure: CATARACT EXTRACTION PHACO AND INTRAOCULAR LENS PLACEMENT (IOC);  Surgeon: Estill Cotta, MD;  Location: [facility] ORS;  Service: Ophthalmology;  Laterality: Right;  [place]   1:00.2 AP%  22.5 CDE  23.67 fluid casette lot #[id number]  exp05/31/2018  . CATARACT EXTRACTION W/PHACO Left 10/15/2015   Procedure: CATARACT EXTRACTION PHACO AND INTRAOCULAR LENS PLACEMENT (IOC);  Surgeon: Estill Cotta, MD;  Location: [facility] ORS;  Service: Ophthalmology;  Laterality: Left;  [place] 01:07 AP% 18.1 CDE 21.57 fluid pack lot # [id number]  . CHOLECYSTECTOMY    . COLONOSCOPY  2017  . ELECTROMAGNETIC NAVIGATION BROCHOSCOPY N/A 07/09/2018    Procedure: ELECTROMAGNETIC NAVIGATION BRONCHOSCOPY;  Surgeon: Flora Lipps, MD;  Location: [facility] ORS;  Service: Cardiopulmonary;  Laterality: N/A;  . TONSILLECTOMY  1947    SOCIAL HISTORY: Social History   Socioeconomic History  . Marital status: Married    Spouse name: Bennye Alm  . Number of children: 0  . Years of education: Master's Degree  . Highest education level: Not on file  Occupational History  . Occupation: Retired  Tobacco Use  . Smoking status: Never Smoker  . Smokeless tobacco: Never Used  Substance and Sexual Activity  . Alcohol use: Yes    Comment: rare wine  . Drug use: No  . Sexual activity: Not Currently  Other Topics Concern  . Not on file  Social History Narrative   Recently moved with her husband to [place] from [place].   Husband is a retired Pharmacist, community.   No children.   She is a retired Pharmacist, hospital.   Enjoys: Chief Technology Officer, reading - mysteries and biographies, cooking   Exercise: walking, gardening   Diet: low appetite due to cancer treatment, grazing   She is a DNR.   Social Determinants of Health   Financial Resource Strain: Low Risk   . Difficulty of Paying Living Expenses: Not hard at all  Food Insecurity:   . Worried About Charity fundraiser in the Last Year:   . [place] in the Last  Year:   Transportation Needs:   . Film/video editor (Medical):   [name] Lack of Transportation (Non-Medical):   Physical Activity:   . Days of Exercise per Week:   . Minutes of Exercise per Session:   Stress:   . Feeling of Stress :   Social Connections:   . Frequency of Communication with Friends and Family:   . Frequency of Social Gatherings with Friends and Family:   . Attends Religious Services:   . Active Member of Clubs or Organizations:   . Attends Archivist Meetings:   [name] Marital Status:   Intimate Partner Violence:   . Fear of Current or Ex-Partner:   . Emotionally Abused:   [name] Physically Abused:   . Sexually Abused:      FAMILY HISTORY: Family History  Problem Relation Age of Onset  . Diabetes Father   . Heart disease Father   . Lymphoma Father   . Heart disease Mother   . Breast cancer Sister 22  . Colon cancer Neg Hx   . Esophageal cancer Neg Hx   . Rectal cancer Neg Hx   . Stomach cancer Neg Hx   . Bladder Cancer Neg Hx   . Kidney cancer Neg Hx     ALLERGIES:  is allergic to sulfa antibiotics.  MEDICATIONS:  Current Outpatient Medications  Medication Sig Dispense Refill  . acetaminophen (TYLENOL) 325 MG tablet Take 325 mg by mouth every 6 (six) hours as needed for mild pain.     [name] anastrozole (ARIMIDEX) 1 MG tablet TAKE 1 TABLET BY MOUTH DAILY 30 tablet 3  . atorvastatin (LIPITOR) 20 MG tablet Take 1 tablet (20 mg total) by mouth at bedtime. 90 tablet 3  . benzonatate (TESSALON) 100 MG capsule Take 1 capsule (100 mg total) by mouth 3 (three) times daily as needed for cough. 60 capsule 0  . buPROPion (WELLBUTRIN XL) 150 MG 24 hr tablet TAKE ONE TABLET BY MOUTH EVERY DAY 90 tablet 2  . Calcium-Magnesium-Vitamin D (CALCIUM 1200+D3 PO) Take 1 tablet by mouth daily.    [name] desonide (DESOWEN) 0.05 % lotion Apply 1 application topically as needed (after showering).     . Dexlansoprazole (DEXILANT) 30 MG capsule Take 1 capsule (30 mg total) by mouth daily. 90 capsule 3  . Dextromethorphan-guaiFENesin (MUCINEX DM) 30-600 MG TB12 Take 1 tablet by mouth 2 (two) times daily as needed (for congestion/cough).    . fluticasone (FLONASE) 50 MCG/ACT nasal spray Place 2 sprays into both nostrils daily. 16 g 6  . ketoconazole (NIZORAL) 2 % shampoo Apply 1 application topically. 3 times a week    . lubiprostone (AMITIZA) 24 MCG capsule TAKE ONE CAPSULE TWICE A DAY WITH MEALS 60 capsule 3  . memantine (NAMENDA) 10 MG tablet TAKE ONE TABLET BY MOUTH TWICE DAILY 180 tablet 2  . Multiple Vitamin (MULTIVITAMIN WITH MINERALS) TABS tablet Take 1 tablet by mouth daily.    [name] MYRBETRIQ 50 MG TB24 tablet TAKE ONE TABLET  BY MOUTH EVERY DAY 90 tablet 1  . ondansetron (ZOFRAN) 8 MG tablet Take 8 mg by mouth every 8 (eight) hours as needed for nausea.    . polyethylene glycol (MIRALAX / GLYCOLAX) packet Take 17 g by mouth daily.    . sucralfate (CARAFATE) 1 g tablet Take 1 tablet (1 g total) by mouth 2 (two) times daily. (Patient taking differently: Take 1 g by mouth 2 (two) times daily. prn) 60 tablet 0  . triamcinolone (NASACORT) 55  MCG/ACT AERO nasal inhaler Place 2 sprays into the nose daily as needed (for congestion).     . venlafaxine XR (EFFEXOR-XR) 150 MG 24 hr capsule TAKE 1 CAPSULE BY MOUTH EVERY DAY 90 capsule 1  . XALKORI 250 MG capsule TAKE 1 CAPSULE (250 MG TOTAL) BY MOUTH 2 TIMES DAILY. 60 capsule 2  . senna (SENOKOT) 8.6 MG TABS tablet Take 2 tablets (17.2 mg total) by mouth daily. Stop Colace (Patient not taking: Reported on 07/15/2019) 60 tablet 0  . zolpidem (AMBIEN) 5 MG tablet TAKE ONE TABLET AT BEDTIME AS NEEDED FORSLEEP (Patient not taking: Reported on 07/15/2019) 30 tablet 0   No current facility-administered medications for this visit.     PHYSICAL EXAMINATION: ECOG PERFORMANCE STATUS: 2 - Symptomatic, <50% confined to bed Vitals:   08/12/19 1032  BP: 126/63  Pulse: (!) 54  Resp: 16  Temp: (!) 96.9 F (36.1 C)   Filed Weights   08/12/19 1032  Weight: 148 lb (67.1 kg)    Physical Exam  Constitutional: She is oriented to person, place, and time. No distress.  Frail female, walks with a walker  HENT:  Head: Normocephalic and atraumatic.  Nose: Nose normal.  Mouth/Throat: Oropharynx is clear and moist. No oropharyngeal exudate.  Eyes: Pupils are equal, round, and reactive to light. EOM are normal. No scleral icterus.  Cardiovascular: Normal rate and regular rhythm.  No murmur heard. Pulmonary/Chest: Effort normal. No respiratory distress. She has no wheezes.  Right anterior chest wall tenderness with palpation.  Abdominal: Soft. She exhibits no distension. There is no  abdominal tenderness.  Musculoskeletal:        General: Edema present. Normal range of motion.     Cervical back: Normal range of motion and neck supple.     Comments: Bilateral lower extremity 3+ edema, right worse than left.   Neurological: She is alert and oriented to person, place, and time. No cranial nerve deficit. She exhibits normal muscle tone. Coordination normal.  Skin: Skin is warm and dry. She is not diaphoretic. No erythema.  Psychiatric: Affect normal.     CMP Latest Ref Rng & Units 08/12/2019  Glucose 70 - 99 mg/dL 97  BUN 8 - 23 mg/dL 23  Creatinine 0.44 - 1.00 mg/dL 1.08(H)  Sodium 135 - 145 mmol/L 136  Potassium 3.5 - 5.1 mmol/L 4.0  Chloride 98 - 111 mmol/L 102  CO2 22 - 32 mmol/L 25  Calcium 8.9 - 10.3 mg/dL 8.1(L)  Total Protein 6.5 - 8.1 g/dL 6.0(L)  Total Bilirubin 0.3 - 1.2 mg/dL 0.7  Alkaline Phos 38 - 126 U/L 133(H)  AST 15 - 41 U/L 31  ALT 0 - 44 U/L 35   CBC Latest Ref Rng & Units 08/12/2019  WBC 4.0 - 10.5 K/uL 5.7  Hemoglobin 12.0 - 15.0 g/dL 10.8(L)  Hematocrit 36.0 - 46.0 % 33.6(L)  Platelets 150 - 400 K/uL 325    LABORATORY DATA:  I have reviewed the data as listed Lab Results  Component Value Date   WBC 5.7 08/12/2019   HGB 10.8 (L) 08/12/2019   HCT 33.6 (L) 08/12/2019   MCV 100.0 08/12/2019   PLT 325 08/12/2019   Recent Labs    06/29/19 1349 07/15/19 1252 08/12/19 1017  NA 139 141 136  K 4.0 4.3 4.0  CL 107 105 102  CO2 '26 28 25  ' GLUCOSE 120* 100* 97  BUN 21 24* 23  CREATININE 1.17* 1.03* 1.08*  CALCIUM 7.7* 8.3* 8.1*  GFRNONAA 44* 51* 48*  GFRAA 51* 59* 56*  PROT 5.4* 6.0* 6.0*  ALBUMIN 2.7* 2.9* 3.1*  AST 34 25 31  ALT 37 32 35  ALKPHOS 115 123 133*  BILITOT 0.4 0.5 0.7   Iron/TIBC/Ferritin/ %Sat No results found for: IRON, TIBC, FERRITIN, IRONPCTSAT   RADIOGRAPHIC STUDIES: I have personally reviewed the radiological images as listed and agreed with the findings in the report. DG Chest 2 View  Result Date:  06/14/2019 CLINICAL DATA:  Right-sided rib pain secondary to a fall. This EXAM: CHEST - 2 VIEW COMPARISON:  Chest x-ray dated 10/10/2018 and chest CT dated 03/23/2019 FINDINGS: There is a slightly displaced fracture of the lateral aspect of the right sixth rib. No pneumothorax or lung contusion or pleural effusion. The heart size and pulmonary vascularity are normal. Aortic atherosclerosis. There is a poorly defined 2.4 cm mass in the left upper lobe consistent with the previously demonstrated cancer on the chest CT of 03/23/2019. Calcified granulomas in the left hemithorax. IMPRESSION: 1. Fracture of the lateral aspect of the right sixth rib. No other acute abnormalities. 2. Left upper lobe lung mass as previously demonstrated. 3.  Aortic Atherosclerosis (ICD10-I70.0). Electronically Signed   By: Lorriane Shire M.D.   On: 06/14/2019 15:43   DG Ribs Unilateral W/Chest Right  Result Date: 07/15/2019 CLINICAL DATA:  Fall 1 week ago.  Right chest wall pain. EXAM: RIGHT RIBS AND CHEST - 3+ VIEW COMPARISON:  06/14/2019 FINDINGS: Multiple right rib fractures. Mildly displaced fracture of the lateral right sixth rib is unchanged compared to the prior chest radiograph. Posterior fractures of the right third and fourth ribs appear new since the prior radiographs. There is a fracture across the posterolateral right seventh rib, and there are recent appearing fractures of the anterolateral right eighth and ninth ribs. No bone lesions. Cardiac silhouette is normal in size. No mediastinal or hilar masses. Mild opacity at the right lung base is likely atelectasis. Stable granuloma noted in the left upper lobe. Lungs are otherwise clear. No pneumothorax. IMPRESSION: 1. Multiple rib fractures as described, with fractures of the posterolateral seventh, anterolateral eighth and ninth, and posterior third and fourth ribs appearing recent and new since the prior chest radiograph. 2. Mild opacity at the right lung base is likely  atelectasis. 3. No pneumothorax. Electronically Signed   By: Lajean Manes M.D.   On: 07/15/2019 13:30      ASSESSMENT & PLAN:  1. Malignant neoplasm of left female breast, unspecified estrogen receptor status, unspecified site of breast ([place])   2. Primary adenocarcinoma of lung, unspecified laterality ([place])   3. Osteopenia, unspecified location   4. Encounter for antineoplastic chemotherapy   5. Aromatase inhibitor use   Cancer Staging Malignant neoplasm of left female breast [facility]) Staging form: Breast, AJCC 8th Edition - Clinical stage from 12/17/2018: Stage IB (cT1b, cN1, cM0, G1, ER+, PR+, HER2-) - Signed by Earlie Server, MD on 12/17/2018  Primary adenocarcinoma of lung [facility]) Staging form: Lung, AJCC 8th Edition - Clinical stage from 09/28/2018: Stage IVA (cT2, cN3, cM1a) - Signed by Earlie Server, MD on 09/28/2018   Patient has 2 primaries.   Stage IV lung adenocarcinoma, met fusion mutation cT2 N3 M1a PDL 1 TPS more than 70%  Patient is clinically doing well.  PET scan showed very good partial response. Labs are reviewed and discussed with patient. Continue Crizotinib   #Breast cancer, responding to Arimidex.   Continue Arimidex.  Obtain CT chest for surveillance.   #  Frequent falls, patient has been referred to physical therapy for prevention program. #Rib fracture secondary to trauma  #Osteopenia, we have received a dental clearance  Stage IV lung and breast cancer.  Proceed with first dose of Xgeva today.  Recommend patient to continue taking Calcium and vitamin D  All questions were answered. The patient knows to call the clinic with any problems questions or concerns. Return of visit: 4 weeks.   Earlie Server, MD, PhD 08/13/2019

## 2019-08-17 ENCOUNTER — Other Ambulatory Visit: Payer: Self-pay

## 2019-08-17 ENCOUNTER — Encounter: Payer: Self-pay | Admitting: Emergency Medicine

## 2019-08-17 ENCOUNTER — Emergency Department
Admission: EM | Admit: 2019-08-17 | Discharge: 2019-08-17 | Disposition: A | Discharge status: Home / Self Care | Payer: Medicare Other | Attending: Student in an Organized Health Care Education/Training Program | Admitting: Student in an Organized Health Care Education/Training Program

## 2019-08-17 DIAGNOSIS — Y939 Activity, unspecified: Secondary | ICD-10-CM | POA: Insufficient documentation

## 2019-08-17 DIAGNOSIS — Y929 Unspecified place or not applicable: Secondary | ICD-10-CM | POA: Diagnosis not present

## 2019-08-17 DIAGNOSIS — S41112A Laceration without foreign body of left upper arm, initial encounter: Secondary | ICD-10-CM | POA: Diagnosis not present

## 2019-08-17 DIAGNOSIS — Z79899 Other long term (current) drug therapy: Secondary | ICD-10-CM | POA: Diagnosis not present

## 2019-08-17 DIAGNOSIS — Y999 Unspecified external cause status: Secondary | ICD-10-CM | POA: Diagnosis not present

## 2019-08-17 DIAGNOSIS — Z23 Encounter for immunization: Secondary | ICD-10-CM | POA: Diagnosis not present

## 2019-08-17 DIAGNOSIS — W2203XA Walked into furniture, initial encounter: Secondary | ICD-10-CM | POA: Insufficient documentation

## 2019-08-17 MED ORDER — LIDOCAINE HCL 1 % IJ SOLN
5.0000 mL | Freq: Once | INTRAMUSCULAR | Status: AC
  Administered 2019-08-17: 5 mL
  Filled 2019-08-17: qty 10

## 2019-08-17 MED ORDER — LIDOCAINE HCL (PF) 1 % IJ SOLN
INTRAMUSCULAR | Status: AC
  Filled 2019-08-17: qty 5

## 2019-08-17 MED ORDER — CEPHALEXIN 500 MG PO CAPS: 500.0000 mg | ORAL_CAPSULE | Freq: Three times a day (TID) | ORAL | 0 refills | Status: AC

## 2019-08-17 MED ORDER — TETANUS-DIPHTH-ACELL PERTUSSIS 5-2.5-18.5 LF-MCG/0.5 IM SUSP
0.5000 mL | Freq: Once | INTRAMUSCULAR | Status: AC
  Administered 2019-08-17: 0.5 mL via INTRAMUSCULAR
  Filled 2019-08-17: qty 0.5

## 2019-08-17 NOTE — Discharge Instructions (Signed)
Keep wound clean and dry for the next 24 hours. Take Keflex 3 times daily for the next week. Have sutures removed by primary care in 1 week. Return to the emergency department immediately if you notice redness or streaking surrounding suture sites.

## 2019-08-17 NOTE — ED Triage Notes (Signed)
Pt presents to ED via POV with c/o laceration to medial L arm. Pt states fell into metal shelf last night and obtained laceration, laceration approx 1in long, no bleeding noted upon arrival to ED. Pt states unknown when last tetanus shot.

## 2019-08-17 NOTE — ED Provider Notes (Signed)
Emergency Department Provider Note  ____________________________________________  Time seen: Approximately 7:55 PM  I have reviewed the triage vital signs and the nursing notes.   HISTORY  Chief Complaint Lip Laceration   Historian Patient     HPI Jamie Matthews is a 79 y.o. female presents to the emergency department with a 4 cm x 3 cm rectangular shaped skin tear with flap intact along the left upper arm.  Patient states that she caught her arm on a metal shelf last night around 11:00 PM.  She states that her husband has been sick recently and she did not have a ride to the emergency department until today.  She states that she cannot recall her last tetanus shot.  Patient denies hitting her head or her neck during injury.  No other alleviating measures have been attempted   Past Medical History:  Diagnosis Date  . AKI (acute kidney injury) ([place]) 08/17/2018  . Anxiety   . Arthritis   . Belching   . Bladder disorder    OVERACTIVE  . Bowel dysfunction    BLOCKAGE  . Cancer (HCC)    breast  . Constipation   . Depression   . Diverticulitis   . Fibromyalgia   . GERD (gastroesophageal reflux disease)   . Hyperlipidemia   . IBS (irritable bowel syndrome)   . Internal hemorrhoids   . Lung cancer ([place]) 2020  . Memory deficits   . Murmur    asymptomatic  . Pneumonia 11/18/12  . Urinary incontinence   . Vertigo      Immunizations up to date:  Yes.     Past Medical History:  Diagnosis Date  . AKI (acute kidney injury) ([place]) 08/17/2018  . Anxiety   . Arthritis   . Belching   . Bladder disorder    OVERACTIVE  . Bowel dysfunction    BLOCKAGE  . Cancer (HCC)    breast  . Constipation   . Depression   . Diverticulitis   . Fibromyalgia   . GERD (gastroesophageal reflux disease)   . Hyperlipidemia   . IBS (irritable bowel syndrome)   . Internal hemorrhoids   . Lung cancer ([place]) 2020  . Memory deficits   . Murmur    asymptomatic  .  Pneumonia 11/18/12  . Urinary incontinence   . Vertigo     Patient Active Problem List   Diagnosis Date Noted  . Multiple rib fractures 07/15/2019  . Fall as cause of accidental injury at home as place of occurrence 07/15/2019  . Encounter for antineoplastic chemotherapy 12/17/2018  . Lower leg edema 12/17/2018  . Aromatase inhibitor use 10/24/2018  . Osteopenia 10/24/2018  . Fever 10/10/2018  . Closed displaced fracture of pubis ([place]) 09/20/2018  . Localized swelling of right lower leg 09/20/2018  . AKI (acute kidney injury) ([place]) 08/17/2018  . Myofascial pain 07/28/2018  . Primary adenocarcinoma of lung ([place]) 07/21/2018  . Malignant neoplasm of left female breast ([place]) 07/21/2018  . Adenopathy   . Goals of care, counseling/discussion 06/26/2018  . IBS (irritable bowel syndrome) 05/25/2018  . BPV (benign positional vertigo) 07/30/2017  . Unilateral primary osteoarthritis, left knee 06/24/2017  . Bunion of great toe of right foot 06/09/2017  . Dizziness 03/24/2017  . Decreased appetite 12/03/2016  . Fatigue 09/22/2016  . Insomnia 09/22/2016  . Depression, recurrent ([place]) 09/22/2016  . Chronic cough 08/21/2016  . Primary localized osteoarthrosis, hand 03/19/2016  . OA (osteoarthritis) of knee 03/17/2016  . Stress due to  illness of family member 10/10/2014  . Ileitis 06/22/2014  . Chronic insomnia 09/20/2013  . DNR (do not resuscitate) 11/15/2012  . Chronic constipation 04/01/2012  . 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 Urinary incontinence   . Major depressive disorder, recurrent, moderate   . Gastroesophageal reflux disease   . Hyperlipidemia     Past Surgical History:  Procedure Laterality Date  . AXILLARY LYMPH NODE BIOPSY Left 06/08/2018   INVASIVE MAMMARY CARCINOMA  . BLADDER SUSPENSION  2004, 2012  . BREAST BIOPSY Left 06/08/2018   INVASIVE MAMMARY CARCINOMA  . CATARACT EXTRACTION W/PHACO Right 08/27/2015   Procedure: CATARACT EXTRACTION PHACO AND INTRAOCULAR LENS  PLACEMENT (IOC);  Surgeon: Estill Cotta, MD;  Location: [facility] ORS;  Service: Ophthalmology;  Laterality: Right;  [place]   1:00.2 AP%  22.5 CDE  23.67 fluid casette lot #[id number]  exp05/31/2018  . CATARACT EXTRACTION W/PHACO Left 10/15/2015   Procedure: CATARACT EXTRACTION PHACO AND INTRAOCULAR LENS PLACEMENT (IOC);  Surgeon: Estill Cotta, MD;  Location: [facility] ORS;  Service: Ophthalmology;  Laterality: Left;  [place] 01:07 AP% 18.1 CDE 21.57 fluid pack lot # [id number]  . CHOLECYSTECTOMY    . COLONOSCOPY  2017  . ELECTROMAGNETIC NAVIGATION BROCHOSCOPY N/A 07/09/2018   Procedure: ELECTROMAGNETIC NAVIGATION BRONCHOSCOPY;  Surgeon: Flora Lipps, MD;  Location: [facility] ORS;  Service: Cardiopulmonary;  Laterality: N/A;  . TONSILLECTOMY  1947    Prior to Admission medications   Medication Sig Start Date End Date Taking? Authorizing Provider  acetaminophen (TYLENOL) 325 MG tablet Take 325 mg by mouth every 6 (six) hours as needed for mild pain.     [provider]  anastrozole (ARIMIDEX) 1 MG tablet TAKE 1 TABLET BY MOUTH DAILY 07/26/19   Earlie Server, MD  atorvastatin (LIPITOR) 20 MG tablet Take 1 tablet (20 mg total) by mouth at bedtime. 12/01/18   [name], MD  benzonatate (TESSALON) 100 MG capsule Take 1 capsule (100 mg total) by mouth 3 (three) times daily as needed for cough. 04/19/19   [name], MD  buPROPion (WELLBUTRIN XL) 150 MG 24 hr tablet TAKE ONE TABLET BY MOUTH EVERY DAY 09/29/18   Lucille Passy, MD  Calcium-Magnesium-Vitamin D (CALCIUM 1200+D3 PO) Take 1 tablet by mouth daily.    [provider]  cephALEXin (KEFLEX) 500 MG capsule Take 1 capsule (500 mg total) by mouth 3 (three) times daily for 7 days. 08/17/19 08/24/19  Lannie Fields, PA-C  desonide (DESOWEN) 0.05 % lotion Apply 1 application topically as needed (after showering).     [provider]  Dexlansoprazole (DEXILANT) 30 MG capsule Take 1 capsule (30 mg total) by mouth daily. 05/05/19   Borders,  Kirt Boys, NP  Dextromethorphan-guaiFENesin (MUCINEX DM) 30-600 MG TB12 Take 1 tablet by mouth 2 (two) times daily as needed (for congestion/cough).    [provider]  fluticasone (FLONASE) 50 MCG/ACT nasal spray Place 2 sprays into both nostrils daily. 11/25/18   [name], MD  ketoconazole (NIZORAL) 2 % shampoo Apply 1 application topically. 3 times a week 04/20/18   [provider]  lubiprostone (AMITIZA) 24 MCG capsule TAKE ONE CAPSULE TWICE A DAY WITH MEALS 02/09/18   Pyrtle, Lajuan Lines, MD  memantine (NAMENDA) 10 MG tablet TAKE ONE TABLET BY MOUTH TWICE DAILY 09/29/18   Lucille Passy, MD  Multiple Vitamin (MULTIVITAMIN WITH MINERALS) TABS tablet Take 1 tablet by mouth daily.    [provider]  MYRBETRIQ 50 MG TB24 tablet TAKE ONE TABLET BY  MOUTH EVERY DAY 05/03/19   [name], MD  ondansetron (ZOFRAN) 8 MG tablet Take 8 mg by mouth every 8 (eight) hours as needed for nausea.    [provider]  polyethylene glycol (MIRALAX / GLYCOLAX) packet Take 17 g by mouth daily.    [provider]  senna (SENOKOT) 8.6 MG TABS tablet Take 2 tablets (17.2 mg total) by mouth daily. Stop Colace Patient not taking: Reported on 07/15/2019 02/11/19   Earlie Server, MD  sucralfate (CARAFATE) 1 g tablet Take 1 tablet (1 g total) by mouth 2 (two) times daily. Patient taking differently: Take 1 g by mouth 2 (two) times daily. prn 03/15/19   Earlie Server, MD  triamcinolone (NASACORT) 55 MCG/ACT AERO nasal inhaler Place 2 sprays into the nose daily as needed (for congestion).     [provider]  venlafaxine XR (EFFEXOR-XR) 150 MG 24 hr capsule TAKE 1 CAPSULE BY MOUTH EVERY DAY 06/28/19   Lucille Passy, MD  Hulda Humphrey 250 MG capsule TAKE 1 CAPSULE (250 MG TOTAL) BY MOUTH 2 TIMES DAILY. 05/24/19   Earlie Server, MD  zolpidem (AMBIEN) 5 MG tablet TAKE ONE TABLET AT BEDTIME AS NEEDED FORSLEEP Patient not taking: Reported on 07/15/2019 05/12/19   Earlie Server, MD    Allergies Sulfa  antibiotics  Family History  Problem Relation Age of Onset  . Diabetes Father   . Heart disease Father   . Lymphoma Father   . Heart disease Mother   . Breast cancer Sister 37  . Colon cancer Neg Hx   . Esophageal cancer Neg Hx   . Rectal cancer Neg Hx   . Stomach cancer Neg Hx   . Bladder Cancer Neg Hx   . Kidney cancer Neg Hx     Social History Social History   Tobacco Use  . Smoking status: Never Smoker  . Smokeless tobacco: Never Used  Substance Use Topics  . Alcohol use: Yes    Comment: rare wine  . Drug use: No     Review of Systems  Constitutional: No fever/chills Eyes:  No discharge ENT: No upper respiratory complaints. Respiratory: no cough. No SOB/ use of accessory muscles to breath Gastrointestinal:   No nausea, no vomiting.  No diarrhea.  No constipation. Musculoskeletal: Negative for musculoskeletal pain. Skin: Patient has skin tear.     ____________________________________________   PHYSICAL EXAM:  VITAL SIGNS: ED Triage Vitals  Enc Vitals Group     BP 08/17/19 1809 (!) 126/58     Pulse Rate 08/17/19 1809 (!) 51     Resp 08/17/19 1809 18     Temp 08/17/19 1809 98.6 F (37 C)     Temp Source 08/17/19 1809 Oral     SpO2 08/17/19 1809 98 %     Weight 08/17/19 1810 148 lb (67.1 kg)     Height 08/17/19 1810 5' (1.524 m)     Head Circumference --      Peak Flow --      Pain Score 08/17/19 1809 4     Pain Loc --      Pain Edu? --      Excl. in [place]? --      Constitutional: Alert and oriented. Well appearing and in no acute distress. Eyes: Conjunctivae are normal. PERRL. EOMI. Head: Atraumatic. Cardiovascular: Normal rate, regular rhythm. Normal S1 and S2.  Good peripheral circulation. Respiratory: Normal respiratory effort without tachypnea or retractions. Lungs CTAB. Good air entry to the bases with  no decreased or absent breath sounds Gastrointestinal: Bowel sounds x 4 quadrants. Soft and nontender to palpation. No guarding or rigidity.  No distention. Musculoskeletal: Full range of motion to all extremities. No obvious deformities noted Neurologic:  Normal for age. No gross focal neurologic deficits are appreciated.  Skin: Patient has a 4 cm x 3 cm rectangular shaped skin tear with flap intact of the left upper extremity with adipose tissue exposure. Psychiatric: Mood and affect are normal for age. Speech and behavior are normal.   ____________________________________________   LABS (all labs ordered are listed, but only abnormal results are displayed)  Labs Reviewed - No data to display ____________________________________________  EKG   ____________________________________________  RADIOLOGY  No results found.  ____________________________________________    PROCEDURES  Procedure(s) performed:     [name] KitchenMarland KitchenLaceration Repair  Date/Time: 08/17/2019 7:58 PM Performed by: Lannie Fields, PA-C Authorized by: Lannie Fields, PA-C   Consent:    Consent obtained:  Verbal   Consent given by:  Patient Anesthesia (see MAR for exact dosages):    Anesthesia method:  None Laceration details:    Location:  Shoulder/arm   Shoulder/arm location:  L upper arm   Length (cm):  4   Depth (mm):  2 Repair type:    Repair type:  Complex Pre-procedure details:    Preparation:  Patient was prepped and draped in usual sterile fashion Exploration:    Contaminated: no   Treatment:    Area cleansed with:  Betadine   Amount of cleaning:  Standard   Irrigation solution:  Sterile saline   Visualized foreign bodies/material removed: no   Skin repair:    Repair method:  Sutures   Suture size:  4-0   Number of sutures:  7 Approximation:    Approximation:  Close Post-procedure details:    Dressing:  Open (no dressing)   Patient tolerance of procedure:  Tolerated well, no immediate complications       Medications  lidocaine (XYLOCAINE) 1 % (with pres) injection 5 mL (5 mLs Infiltration Given 08/17/19 1939)   lidocaine (XYLOCAINE) 1 % (with pres) injection 5 mL (5 mLs Infiltration Given 08/17/19 1939)  Tdap (BOOSTRIX) injection 0.5 mL (0.5 mLs Intramuscular Given 08/17/19 1939)     ____________________________________________   INITIAL IMPRESSION / ASSESSMENT AND PLAN / ED COURSE  Pertinent labs & imaging results that were available during my care of the patient were reviewed by me and considered in my medical decision making (see chart for details).      Assessment and Plan:  Skin tear 79 year old female presents to the emergency department with a skin tear of the left upper extremity, 4 cm x 3 cm with adipose tissue exposure that has been present since 11:00 PM last night.  Patient is within the 24-hour window to have a laceration repair occur.  I did caution patient that she is at increased risk for infection given the amount of time that has been present between injury and laceration repair.  Her tetanus status was updated in the emergency department and she was advised to have external sutures removed after 7 days.  She was cautioned to return to the emergency department with redness or streaking surrounding wound site.  She was discharged with Keflex 3 times a day for the next 7 days.   ____________________________________________  FINAL CLINICAL IMPRESSION(S) / ED DIAGNOSES  Final diagnoses:  ISTAP type 2 skin tear of left upper arm      NEW MEDICATIONS STARTED DURING THIS  VISIT:  ED Discharge Orders         Ordered    cephALEXin (KEFLEX) 500 MG capsule  3 times daily     08/17/19 1948              This chart was dictated using voice recognition software/Dragon. Despite best efforts to proofread, errors can occur which can change the meaning. Any change was purely unintentional.     Karren Cobble 08/17/19 2001    Merlyn Lot, MD 08/17/19 2215

## 2019-08-17 NOTE — ED Notes (Signed)
This RN received call from [facility]; states if patient is discharged, call [phone] for transportation back to facility.

## 2019-08-23 ENCOUNTER — Telehealth: Payer: Self-pay | Admitting: Family Medicine

## 2019-08-23 NOTE — Progress Notes (Signed)
  Chronic Care Management   Outreach Note  08/23/2019 Name: Anjelica Gorniak MRN: [id number] DOB: [birth year]  Referred by: [name], MD Reason for referral : No chief complaint on file.   An unsuccessful telephone outreach was attempted today. The patient was referred to the pharmacist for assistance with care management and care coordination.   Follow Up Plan:   Raynicia Dukes [employer] Scheduler

## 2019-08-24 ENCOUNTER — Telehealth: Payer: Self-pay | Admitting: Family Medicine

## 2019-08-24 NOTE — Chronic Care Management (AMB) (Signed)
  Chronic Care Management   Note  08/24/2019 Name: Jamie Matthews MRN: [id number] DOB: [birth year]  Jamie Matthews Jamie Matthews is a 79 y.o. year old female who is a primary care patient of Einar Pheasant Jobe Marker, MD. I reached out to Jamie Matthews by phone today in response to a referral sent by Jamie Matthews PCP, Jamie Noe, MD.   [name] was given information about Chronic Care Management services today including:  1. CCM service includes personalized support from designated clinical staff supervised by her physician, including individualized plan of care and coordination with other care providers 2. 24/7 contact phone numbers for assistance for urgent and routine care needs. 3. Service will only be billed when office clinical staff spend 20 minutes or more in a month to coordinate care. 4. Only one practitioner may furnish and bill the service in a calendar month. 5. The patient may stop CCM services at any time (effective at the end of the month) by phone call to the office staff.   Patient agreed to services and verbal consent obtained.   Follow up plan:   Jamie Matthews [employer] Scheduler

## 2019-08-26 ENCOUNTER — Ambulatory Visit: Payer: Medicare Other | Attending: Oncology

## 2019-09-01 ENCOUNTER — Other Ambulatory Visit: Payer: Self-pay | Admitting: Oncology

## 2019-09-02 NOTE — Telephone Encounter (Signed)
Documented on 07/15/19 by PCP:  Stay off lasix as it could increase fall risk.  Will refer to [facility].

## 2019-09-03 NOTE — Telephone Encounter (Signed)
Dr.Cody,  Are you ok with patient taking Lasix for her edema which is caused by her cancer treatments?  Lasix may increase her fall risk.  Earlie Server

## 2019-09-05 NOTE — Telephone Encounter (Signed)
It seems she has been having frequent falls at home. Since the lasix is only for edema, compression stockings would be a better choice.   She saw one of my partners in February and they recommended PT.  Routing to MA to follow-up to see if she has gone to PT and offer follow-up appointment to check in on [place]    FYI to Dr. Tasia Catchings

## 2019-09-05 NOTE — Telephone Encounter (Signed)
Patient advised. Patient did not have PT done at [facility] yet. She had an appointment scheduled but then had to post pone.

## 2019-09-05 NOTE — Telephone Encounter (Signed)
Left message for patient to call back  

## 2019-09-06 ENCOUNTER — Ambulatory Visit: Payer: Medicare Other | Admitting: Family Medicine

## 2019-09-06 ENCOUNTER — Ambulatory Visit (INDEPENDENT_AMBULATORY_CARE_PROVIDER_SITE_OTHER): Payer: Medicare Other | Admitting: Family Medicine

## 2019-09-06 ENCOUNTER — Other Ambulatory Visit: Payer: Self-pay

## 2019-09-06 ENCOUNTER — Encounter: Payer: Self-pay | Admitting: Family Medicine

## 2019-09-06 VITALS — BP 112/60 | HR 57 | Temp 98.0°F | Ht 63.0 in | Wt 141.5 lb

## 2019-09-06 DIAGNOSIS — L299 Pruritus, unspecified: Secondary | ICD-10-CM | POA: Diagnosis not present

## 2019-09-06 DIAGNOSIS — M25561 Pain in right knee: Secondary | ICD-10-CM

## 2019-09-06 DIAGNOSIS — N3281 Overactive bladder: Secondary | ICD-10-CM | POA: Diagnosis not present

## 2019-09-06 DIAGNOSIS — G8929 Other chronic pain: Secondary | ICD-10-CM | POA: Diagnosis not present

## 2019-09-06 LAB — TSH: TSH: 4.98 u[IU]/mL — ABNORMAL HIGH (ref 0.35–4.50)

## 2019-09-06 NOTE — Patient Instructions (Addendum)
Use the lotion that Bennye Alm is using Labs today  DNR completed  Go to physical therapy  Knee pain - try Voltaren Gel to the area - return for injection if pain not improving  #Referral I have placed a referral to a specialist for you. You should receive a phone call from the specialty office. Make sure your voicemail is not full and that if you are able to answer your phone to unknown or new numbers.   It may take up to 2 weeks to hear about the referral. If you do not hear anything in 2 weeks, please call our office and ask to speak with the referral coordinator.

## 2019-09-06 NOTE — Assessment & Plan Note (Signed)
Pt notes worsening symptoms. No constipation. Has incontinence. Discussed returning to urology and she is in agreement. Taking Myrbetriq.

## 2019-09-06 NOTE — Assessment & Plan Note (Signed)
Advised PT and voltaren gel trial. Return if wanting injection. Suspect arthritis

## 2019-09-06 NOTE — Progress Notes (Signed)
Subjective:     Jamie Matthews is a 79 y.o. female presenting for Pruritis (back. for a while. no rash)     HPI  #Pruritis - on back - for a while - no rash present - present 6 months - no new medications - endorses fatigue and tiredness - not sure if chemo or cancer   #Right knee pain - no injury - hard to walk - using a walker - has had some falls recently - around the front of knee and radiates to the ankle - wearing support stockings - recurrent falls and balance issues  - has not been to physical therapy   Review of Systems   Social History   Tobacco Use  Smoking Status Never Smoker  Smokeless Tobacco Never Used        Objective:    BP Readings from Last 3 Encounters:  09/06/19 112/60  08/17/19 127/63  08/12/19 126/63   Wt Readings from Last 3 Encounters:  09/06/19 141 lb 8 oz (64.2 kg)  08/17/19 148 lb (67.1 kg)  08/12/19 148 lb (67.1 kg)    BP 112/60   Pulse (!) 57   Temp 98 F (36.7 C)   Ht 5\' 3"  (1.6 m)   Wt 141 lb 8 oz (64.2 kg)   SpO2 98%   BMI 25.07 kg/m    Physical Exam Constitutional:      General: She is not in acute distress.    Appearance: She is well-developed. She is not diaphoretic.  HENT:     Right Ear: External ear normal.     Left Ear: External ear normal.  Eyes:     Conjunctiva/sclera: Conjunctivae normal.  Cardiovascular:     Rate and Rhythm: Normal rate.  Pulmonary:     Effort: Pulmonary effort is normal.  Musculoskeletal:     Cervical back: Neck supple.     Comments: Right knee Inspection: no swelling, erythema Palpation: TTP along the lateral joint line ROM: pain with flexion/extension Strength: some general weakness, ambulates with walker, unable to step up to exam table.   Skin:    General: Skin is warm and dry.     Capillary Refill: Capillary refill takes less than 2 seconds.     Comments: Back with several seborrheic keratosis lesions. No erythema, no scale. A few excoriations.     Neurological:     Mental Status: She is alert. Mental status is at baseline.  Psychiatric:        Mood and Affect: Mood normal.        Behavior: Behavior normal.           Assessment & Plan:   Problem List Items Addressed This Visit      Musculoskeletal and Integument   Pruritus - Primary    Pt with chronic kidney disease which could be contributing. Suspect she may have dry skin. Does not feel the SK are the cause of symptoms. Encouraged lotion. Will check thyroid to rule out.       Relevant Orders   TSH     Genitourinary   Overactive bladder    Pt notes worsening symptoms. No constipation. Has incontinence. Discussed returning to urology and she is in agreement. Taking Myrbetriq.       Relevant Orders   Ambulatory referral to Urology     Other   Chronic pain of right knee    Advised PT and voltaren gel trial. Return if wanting injection. Suspect arthritis  Return if symptoms worsen or fail to improve.  [name], MD

## 2019-09-06 NOTE — Assessment & Plan Note (Signed)
Pt with chronic kidney disease which could be contributing. Suspect she may have dry skin. Does not feel the SK are the cause of symptoms. Encouraged lotion. Will check thyroid to rule out.

## 2019-09-07 ENCOUNTER — Other Ambulatory Visit (INDEPENDENT_AMBULATORY_CARE_PROVIDER_SITE_OTHER): Payer: Medicare Other

## 2019-09-07 DIAGNOSIS — E039 Hypothyroidism, unspecified: Secondary | ICD-10-CM

## 2019-09-07 LAB — T3, FREE: T3, Free: 2.7 pg/mL (ref 2.3–4.2)

## 2019-09-07 LAB — T4, FREE: Free T4: 0.7 ng/dL (ref 0.60–1.60)

## 2019-09-08 ENCOUNTER — Other Ambulatory Visit: Payer: Self-pay

## 2019-09-08 DIAGNOSIS — C50912 Malignant neoplasm of unspecified site of left female breast: Secondary | ICD-10-CM

## 2019-09-09 ENCOUNTER — Inpatient Hospital Stay: Payer: Medicare Other | Admitting: Oncology

## 2019-09-09 ENCOUNTER — Telehealth: Payer: Self-pay | Admitting: Family Medicine

## 2019-09-09 ENCOUNTER — Inpatient Hospital Stay: Payer: Medicare Other

## 2019-09-09 ENCOUNTER — Telehealth: Payer: Self-pay

## 2019-09-09 DIAGNOSIS — K219 Gastro-esophageal reflux disease without esophagitis: Secondary | ICD-10-CM

## 2019-09-09 DIAGNOSIS — E7800 Pure hypercholesterolemia, unspecified: Secondary | ICD-10-CM

## 2019-09-09 DIAGNOSIS — E038 Other specified hypothyroidism: Secondary | ICD-10-CM

## 2019-09-09 DIAGNOSIS — E039 Hypothyroidism, unspecified: Secondary | ICD-10-CM

## 2019-09-09 MED ORDER — LEVOTHYROXINE SODIUM 25 MCG PO TABS: 25.0000 ug | ORAL_TABLET | Freq: Every day | ORAL | 1 refills | Status: DC

## 2019-09-09 NOTE — Telephone Encounter (Signed)
Please have patient schedule lab appointment in 6 weeks to recheck thyroid

## 2019-09-09 NOTE — Telephone Encounter (Signed)
Per written referral from PCP, requesting referral in Epic for Jamie Matthews to chronic care management pharmacy services for the following conditions:   GERD [K21.9]  Hyperlipidemia [E78.5]  Debbora Dus, PharmD Clinical Pharmacist [facility] [phone]

## 2019-09-09 NOTE — Telephone Encounter (Signed)
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 However, since you are fatigued we could start a low dose of thyroid medication to see if that improves your symptoms. It can take 4-6 weeks to have an effect.   Let me know if you would like to try the medication and I can send it to the pharmacy.   [name]

## 2019-09-09 NOTE — Telephone Encounter (Signed)
Patient advised. Patient will call back to schedule lab visit.

## 2019-09-12 NOTE — Telephone Encounter (Signed)
Referral placed. Nothing further is needed at this time.

## 2019-09-13 ENCOUNTER — Other Ambulatory Visit: Payer: Self-pay

## 2019-09-13 MED ORDER — MIRABEGRON ER 50 MG PO TB24: 50.0000 mg | ORAL_TABLET | Freq: Every day | ORAL | 1 refills | Status: DC

## 2019-09-15 ENCOUNTER — Encounter: Payer: Self-pay | Admitting: Family Medicine

## 2019-09-15 ENCOUNTER — Ambulatory Visit (INDEPENDENT_AMBULATORY_CARE_PROVIDER_SITE_OTHER): Payer: Medicare Other | Admitting: Family Medicine

## 2019-09-15 ENCOUNTER — Other Ambulatory Visit: Payer: Self-pay

## 2019-09-15 VITALS — BP 110/58 | HR 68 | Temp 97.2°F | Ht 63.0 in | Wt 150.0 lb

## 2019-09-15 DIAGNOSIS — M7060 Trochanteric bursitis, unspecified hip: Secondary | ICD-10-CM

## 2019-09-15 NOTE — Patient Instructions (Signed)
Likely greater trochanter bursitis.  Ice for 5 minutes at a time multiple times per day.  If not better, then update Dr. Einar Pheasant.  Take care.  Glad to see you.

## 2019-09-15 NOTE — Progress Notes (Signed)
This visit occurred during the SARS-CoV-2 public health emergency.  Safety protocols were in place, including screening questions prior to the visit, additional usage of staff PPE, and extensive cleaning of exam room while observing appropriate contact time as indicated for disinfecting solutions.  L hip pain. Walking with walker.  Limping from pain.  Worse in the last month.  No falls to cause the pain.  Lateral pain.  No pain sitting.  Not much pain with moving to stand but pain walking.  No pain when supine.  No fevers or chills.    PET 04/2019 with [facility]: No focal hypermetabolic activity to suggest skeletal metastasis.   nad ncat rrr ctab L hip with normal ROM but L greater trochanter bursa ttp on exam.  No pain on left hip internal rotation. Able to bear weight with walker.

## 2019-09-16 ENCOUNTER — Telehealth: Payer: Medicare Other

## 2019-09-17 DIAGNOSIS — Z23 Encounter for immunization: Secondary | ICD-10-CM | POA: Diagnosis not present

## 2019-09-19 ENCOUNTER — Telehealth: Payer: Self-pay | Admitting: Family Medicine

## 2019-09-19 NOTE — Telephone Encounter (Signed)
Spoke with patient and scheduled to see Dr Lorelei Pont on 09/21/19

## 2019-09-19 NOTE — Telephone Encounter (Signed)
She can schedule with either me or Dr. Lorelei Pont.

## 2019-09-19 NOTE — Telephone Encounter (Signed)
Pt is calling back to give an update on her left hip pain.  Pt diagnosed with Bursitis 4/22, advised to follow up with Dr Einar Pheasant if not improving.   Pt mentioned wanting a shot -- please advise if able to work her in or if she needs to see Dr Lorelei Pont. Thanks.

## 2019-09-20 ENCOUNTER — Encounter: Payer: Self-pay | Admitting: Oncology

## 2019-09-20 ENCOUNTER — Inpatient Hospital Stay (HOSPITAL_BASED_OUTPATIENT_CLINIC_OR_DEPARTMENT_OTHER): Payer: Medicare Other | Admitting: Oncology

## 2019-09-20 ENCOUNTER — Other Ambulatory Visit: Payer: Self-pay

## 2019-09-20 ENCOUNTER — Inpatient Hospital Stay: Payer: Medicare Other | Attending: Oncology

## 2019-09-20 VITALS — BP 125/51 | HR 50 | Temp 96.2°F | Resp 18 | Wt 154.9 lb

## 2019-09-20 DIAGNOSIS — R5383 Other fatigue: Secondary | ICD-10-CM | POA: Diagnosis not present

## 2019-09-20 DIAGNOSIS — C3492 Malignant neoplasm of unspecified part of left bronchus or lung: Secondary | ICD-10-CM

## 2019-09-20 DIAGNOSIS — Z79899 Other long term (current) drug therapy: Secondary | ICD-10-CM | POA: Diagnosis not present

## 2019-09-20 DIAGNOSIS — Z833 Family history of diabetes mellitus: Secondary | ICD-10-CM | POA: Insufficient documentation

## 2019-09-20 DIAGNOSIS — M7060 Trochanteric bursitis, unspecified hip: Secondary | ICD-10-CM | POA: Insufficient documentation

## 2019-09-20 DIAGNOSIS — C50811 Malignant neoplasm of overlapping sites of right female breast: Secondary | ICD-10-CM | POA: Diagnosis not present

## 2019-09-20 DIAGNOSIS — C773 Secondary and unspecified malignant neoplasm of axilla and upper limb lymph nodes: Secondary | ICD-10-CM | POA: Diagnosis not present

## 2019-09-20 DIAGNOSIS — Z79811 Long term (current) use of aromatase inhibitors: Secondary | ICD-10-CM | POA: Diagnosis not present

## 2019-09-20 DIAGNOSIS — K219 Gastro-esophageal reflux disease without esophagitis: Secondary | ICD-10-CM | POA: Insufficient documentation

## 2019-09-20 DIAGNOSIS — Z170 Estrogen receptor positive status [ER+]: Secondary | ICD-10-CM | POA: Insufficient documentation

## 2019-09-20 DIAGNOSIS — Z8719 Personal history of other diseases of the digestive system: Secondary | ICD-10-CM | POA: Insufficient documentation

## 2019-09-20 DIAGNOSIS — C50912 Malignant neoplasm of unspecified site of left female breast: Secondary | ICD-10-CM

## 2019-09-20 DIAGNOSIS — Z808 Family history of malignant neoplasm of other organs or systems: Secondary | ICD-10-CM | POA: Diagnosis not present

## 2019-09-20 DIAGNOSIS — C3412 Malignant neoplasm of upper lobe, left bronchus or lung: Secondary | ICD-10-CM | POA: Diagnosis not present

## 2019-09-20 DIAGNOSIS — Z882 Allergy status to sulfonamides status: Secondary | ICD-10-CM | POA: Diagnosis not present

## 2019-09-20 DIAGNOSIS — M1990 Unspecified osteoarthritis, unspecified site: Secondary | ICD-10-CM | POA: Insufficient documentation

## 2019-09-20 DIAGNOSIS — R600 Localized edema: Secondary | ICD-10-CM | POA: Insufficient documentation

## 2019-09-20 DIAGNOSIS — Z8249 Family history of ischemic heart disease and other diseases of the circulatory system: Secondary | ICD-10-CM | POA: Diagnosis not present

## 2019-09-20 DIAGNOSIS — Z803 Family history of malignant neoplasm of breast: Secondary | ICD-10-CM | POA: Insufficient documentation

## 2019-09-20 DIAGNOSIS — M8580 Other specified disorders of bone density and structure, unspecified site: Secondary | ICD-10-CM | POA: Insufficient documentation

## 2019-09-20 DIAGNOSIS — K589 Irritable bowel syndrome without diarrhea: Secondary | ICD-10-CM | POA: Diagnosis not present

## 2019-09-20 LAB — CBC WITH DIFFERENTIAL/PLATELET
Abs Immature Granulocytes: 0.02 10*3/uL (ref 0.00–0.07)
Basophils Absolute: 0 10*3/uL (ref 0.0–0.1)
Basophils Relative: 1 %
Eosinophils Absolute: 0.7 10*3/uL — ABNORMAL HIGH (ref 0.0–0.5)
Eosinophils Relative: 14 %
HCT: 33 % — ABNORMAL LOW (ref 36.0–46.0)
Hemoglobin: 10.6 g/dL — ABNORMAL LOW (ref 12.0–15.0)
Immature Granulocytes: 0 %
Lymphocytes Relative: 29 %
Lymphs Abs: 1.5 10*3/uL (ref 0.7–4.0)
MCH: 32.2 pg (ref 26.0–34.0)
MCHC: 32.1 g/dL (ref 30.0–36.0)
MCV: 100.3 fL — ABNORMAL HIGH (ref 80.0–100.0)
Monocytes Absolute: 1.2 10*3/uL — ABNORMAL HIGH (ref 0.1–1.0)
Monocytes Relative: 23 %
Neutro Abs: 1.8 10*3/uL (ref 1.7–7.7)
Neutrophils Relative %: 33 %
Platelets: 276 10*3/uL (ref 150–400)
RBC: 3.29 MIL/uL — ABNORMAL LOW (ref 3.87–5.11)
RDW: 14.2 % (ref 11.5–15.5)
WBC: 5.3 10*3/uL (ref 4.0–10.5)
nRBC: 0 % (ref 0.0–0.2)

## 2019-09-20 LAB — COMPREHENSIVE METABOLIC PANEL
ALT: 30 U/L (ref 0–44)
AST: 29 U/L (ref 15–41)
Albumin: 3.1 g/dL — ABNORMAL LOW (ref 3.5–5.0)
Alkaline Phosphatase: 109 U/L (ref 38–126)
Anion gap: 7 (ref 5–15)
BUN: 26 mg/dL — ABNORMAL HIGH (ref 8–23)
CO2: 27 mmol/L (ref 22–32)
Calcium: 7.5 mg/dL — ABNORMAL LOW (ref 8.9–10.3)
Chloride: 107 mmol/L (ref 98–111)
Creatinine, Ser: 1.07 mg/dL — ABNORMAL HIGH (ref 0.44–1.00)
GFR calc Af Amer: 56 mL/min — ABNORMAL LOW (ref >60)
GFR calc non Af Amer: 49 mL/min — ABNORMAL LOW (ref >60)
Glucose, Bld: 124 mg/dL — ABNORMAL HIGH (ref 70–99)
Potassium: 5 mmol/L (ref 3.5–5.1)
Sodium: 141 mmol/L (ref 135–145)
Total Bilirubin: 0.6 mg/dL (ref 0.3–1.2)
Total Protein: 6 g/dL — ABNORMAL LOW (ref 6.5–8.1)

## 2019-09-20 NOTE — Assessment & Plan Note (Signed)
Likely the issue.  Discussed options.  Ice as needed with routine cautions and update [place] as needed.  She agrees.  Anatomy discussed with patient.

## 2019-09-20 NOTE — Progress Notes (Signed)
Hematology/Oncology follow up note [facility] [phone] [phone]   Patient Care Team: [name], MD as PCP - General (Family Medicine) Osborne Oman, MD as Consulting Physician (Obstetrics and Gynecology) Pyrtle, Lajuan Lines, MD as Consulting Physician (Gastroenterology) Telford Nab, RN as Registered Nurse Earlie Server, MD as Consulting Physician (Hematology and Oncology) Debbora Dus, [facility] as Pharmacist (Pharmacist)   REASON FOR VISIT:  Follow up for management of lung cancer and breast cancer  HISTORY OF PRESENTING ILLNESS:  Jamie Matthews is a  79 y.o.  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 05/05/2018 bilateral diagnostic breast mammogram showed suspicious mass 1.1cm  in the 12:00 retroareolar region of the left breast and the left axillary lymph node. 06/08/2018 patient status post a left breast retroareolar and left axillary lymph node biopsy. Pathology showed invasive mammary carcinoma, no special type, grade 1, left axillary lymph node positive for invasive mammary carcinoma clinically metastatic.  Background lymph node architecture is not identified. ER> 90% PR> 90%, HER-2 negative.  PET scan done which unfortunately showed additional hypermetabolic bilateral hilar lymph nodes as well as left upper lobe lung mass which may represent a focus of metastatic disease from breast, or primary lung neoplasm.  There is multiple additional small pulmonary nodules are scattered throughout both lungs which are worrisome for metastasis.  Index nodule within the medial right upper lobe measures 7 mm,, peri-broncho-vascular nodule in the right lower lobe measures 1.2 cm.  # Stage IV lung cancer-  Biopsy of lung mass left upper lobe showed non-small cell lung cancer, favor adenocarcinoma.  #NGS came back patient has PD L1 is 70% TPS, MET fusion mutation.  #Mid-March 2020 started on  crizotinib  Bilateral lower extremity edema, right >left, Ultrasound venous right 09/20/2018 no DVT.  2D echo 10/10/2018 showed LVEF 55 to 60% Most likely secondary to crizotinib side effects.  # 12/15/2018 CT chest images were independently reviewed and discussed with patient. Dominant left upper lobe nodule and multiple scattered irregular pulmonary parenchymal nodular lesions are stable. Mild basilar pulmonary parenchymal septal thickening is new and can be seen with pulmonary edema. Patient reports no shortness of breath asymptomatic  #12/29/2018 unilateral left diagnostic mammogram with ultrasound images were independently reviewed and discussed with patient. Slight interval reduction of the size of the left retroareolar breast mass, 9 x 6 x 7 mm, comparing to 10 x 9 x 8 mm. Axillary lymph node measured 1.3 x 1.0 x 1.0 cm, previously 2.2 x 1.4 x 1.5 cm,  there were 2 other abdominal lymph nodes in the left axilla, which were not reported in previous ultrasound. Discussed with patient that overall, breast cancer responded to endocrine treatments.  Additional 2 lymph nodes need to be closely monitored.  Recommend patient to continue Arimidex.   INTERVAL HISTORY Jamie Matthews is a 79 y.o. female who has above history reviewed by me today presents for follow up visit for evaluation of tolerability of treatment for breast cancer and Stage IV lung cancer. Patient reports no new complaints today. She takes crizotinib and so far tolerates well. Chronic lower extremity edema, previously on Lasix.  Due to the high fall risk, Lasix has been discontinued.  Patient to use compression stocking. Also takes Arimidex and tolerates well.   .   .   Review of Systems  Constitutional: Positive for fatigue. Negative for appetite change, chills and fever.  HENT:   Negative for  hearing loss and voice change.   Eyes: Negative for eye problems.  Respiratory: Negative for chest tightness and  cough.   Cardiovascular: Positive for leg swelling. Negative for chest pain.  Gastrointestinal: Negative for abdominal distention, abdominal pain and blood in stool.  Endocrine: Negative for hot flashes.  Genitourinary: Negative for difficulty urinating and frequency.   Musculoskeletal: Negative for arthralgias.  Skin: Negative for itching and rash.  Neurological: Negative for extremity weakness.  Hematological: Negative for adenopathy.  Psychiatric/Behavioral: Negative for confusion and sleep disturbance.    MEDICAL HISTORY:  Past Medical History:  Diagnosis Date  . AKI (acute kidney injury) ([place]) 08/17/2018  . Anxiety   . Arthritis   . Belching   . Bladder disorder    OVERACTIVE  . Bowel dysfunction    BLOCKAGE  . Cancer (HCC)    breast  . Constipation   . Depression   . Diverticulitis   . Fibromyalgia   . GERD (gastroesophageal reflux disease)   . Hyperlipidemia   . IBS (irritable bowel syndrome)   . Internal hemorrhoids   . Lung cancer ([place]) 2020  . Memory deficits   . Murmur    asymptomatic  . Pneumonia 11/18/12  . Urinary incontinence   . Vertigo     SURGICAL HISTORY: Past Surgical History:  Procedure Laterality Date  . AXILLARY LYMPH NODE BIOPSY Left 06/08/2018   INVASIVE MAMMARY CARCINOMA  . BLADDER SUSPENSION  2004, 2012  . BREAST BIOPSY Left 06/08/2018   INVASIVE MAMMARY CARCINOMA  . CATARACT EXTRACTION W/PHACO Right 08/27/2015   Procedure: CATARACT EXTRACTION PHACO AND INTRAOCULAR LENS PLACEMENT (IOC);  Surgeon: Estill Cotta, MD;  Location: [facility] ORS;  Service: Ophthalmology;  Laterality: Right;  [place]   1:00.2 AP%  22.5 CDE  23.67 fluid casette lot #[id number]  exp05/31/2018  . CATARACT EXTRACTION W/PHACO Left 10/15/2015   Procedure: CATARACT EXTRACTION PHACO AND INTRAOCULAR LENS PLACEMENT (IOC);  Surgeon: Estill Cotta, MD;  Location: [facility] ORS;  Service: Ophthalmology;  Laterality: Left;  [place] 01:07 AP% 18.1 CDE 21.57 fluid pack lot # [id number]  .  CHOLECYSTECTOMY    . COLONOSCOPY  2017  . ELECTROMAGNETIC NAVIGATION BROCHOSCOPY N/A 07/09/2018   Procedure: ELECTROMAGNETIC NAVIGATION BRONCHOSCOPY;  Surgeon: Flora Lipps, MD;  Location: [facility] ORS;  Service: Cardiopulmonary;  Laterality: N/A;  . TONSILLECTOMY  1947    SOCIAL HISTORY: Social History   Socioeconomic History  . Marital status: Married    Spouse name: Bennye Alm  . Number of children: 0  . Years of education: Master's Degree  . Highest education level: Not on file  Occupational History  . Occupation: Retired  Tobacco Use  . Smoking status: Never Smoker  . Smokeless tobacco: Never Used  Substance and Sexual Activity  . Alcohol use: Yes    Comment: rare wine  . Drug use: No  . Sexual activity: Not Currently  Other Topics Concern  . Not on file  Social History Narrative   Recently moved with her husband to [place] from [place].   Husband is a retired Pharmacist, community.   No children.   She is a retired Pharmacist, hospital.   Enjoys: Chief Technology Officer, reading - mysteries and biographies, cooking   Exercise: walking, gardening   Diet: low appetite due to cancer treatment, grazing   She is a DNR.   Social Determinants of Health   Financial Resource Strain: Low Risk   . Difficulty of Paying Living Expenses: Not hard at all  Food Insecurity:   . Worried About Estate manager/land agent  of Food in the Last Year:   . [place] in the Last Year:   Transportation Needs:   . Lack of Transportation (Medical):   [name] Lack of Transportation (Non-Medical):   Physical Activity:   . Days of Exercise per Week:   . Minutes of Exercise per Session:   Stress:   . Feeling of Stress :   Social Connections:   . Frequency of Communication with Friends and Family:   . Frequency of Social Gatherings with Friends and Family:   . Attends Religious Services:   . Active Member of Clubs or Organizations:   . Attends Archivist Meetings:   [name] Marital Status:   Intimate Partner Violence:   . Fear of  Current or Ex-Partner:   . Emotionally Abused:   [name] Physically Abused:   . Sexually Abused:     FAMILY HISTORY: Family History  Problem Relation Age of Onset  . Diabetes Father   . Heart disease Father   . Lymphoma Father   . Heart disease Mother   . Breast cancer Sister 74  . Colon cancer Neg Hx   . Esophageal cancer Neg Hx   . Rectal cancer Neg Hx   . Stomach cancer Neg Hx   . Bladder Cancer Neg Hx   . Kidney cancer Neg Hx     ALLERGIES:  is allergic to sulfa antibiotics.  MEDICATIONS:  Current Outpatient Medications  Medication Sig Dispense Refill  . acetaminophen (TYLENOL) 325 MG tablet Take 325 mg by mouth every 6 (six) hours as needed for mild pain.     [name] anastrozole (ARIMIDEX) 1 MG tablet TAKE 1 TABLET BY MOUTH DAILY 30 tablet 3  . atorvastatin (LIPITOR) 20 MG tablet Take 1 tablet (20 mg total) by mouth at bedtime. 90 tablet 3  . benzonatate (TESSALON) 100 MG capsule Take 1 capsule (100 mg total) by mouth 3 (three) times daily as needed for cough. 60 capsule 0  . buPROPion (WELLBUTRIN XL) 150 MG 24 hr tablet TAKE ONE TABLET BY MOUTH EVERY DAY 90 tablet 2  . Calcium-Magnesium-Vitamin D (CALCIUM 1200+D3 PO) Take 1 tablet by mouth daily.    [name] desonide (DESOWEN) 0.05 % lotion Apply 1 application topically as needed (after showering).     . Dexlansoprazole (DEXILANT) 30 MG capsule Take 1 capsule (30 mg total) by mouth daily. 90 capsule 3  . Dextromethorphan-guaiFENesin (MUCINEX DM) 30-600 MG TB12 Take 1 tablet by mouth 2 (two) times daily as needed (for congestion/cough).    . fluticasone (FLONASE) 50 MCG/ACT nasal spray Place 2 sprays into both nostrils daily. 16 g 6  . ketoconazole (NIZORAL) 2 % shampoo Apply 1 application topically. 3 times a week    . levothyroxine (SYNTHROID) 25 MCG tablet Take 1 tablet (25 mcg total) by mouth daily. 30 tablet 1  . lubiprostone (AMITIZA) 24 MCG capsule TAKE ONE CAPSULE TWICE A DAY WITH MEALS 60 capsule 3  . memantine (NAMENDA) 10 MG  tablet TAKE ONE TABLET BY MOUTH TWICE DAILY 180 tablet 2  . mirabegron ER (MYRBETRIQ) 50 MG TB24 tablet Take 1 tablet (50 mg total) by mouth daily. 90 tablet 1  . Multiple Vitamin (MULTIVITAMIN WITH MINERALS) TABS tablet Take 1 tablet by mouth daily.    . ondansetron (ZOFRAN) 8 MG tablet Take 8 mg by mouth every 8 (eight) hours as needed for nausea.    . polyethylene glycol (MIRALAX / GLYCOLAX) packet Take 17 g by mouth daily.    [name]  sucralfate (CARAFATE) 1 g tablet Take 1 tablet (1 g total) by mouth 2 (two) times daily. 60 tablet 0  . triamcinolone (NASACORT) 55 MCG/ACT AERO nasal inhaler Place 2 sprays into the nose daily as needed (for congestion).     Hulda Humphrey 250 MG capsule TAKE 1 CAPSULE (250 MG TOTAL) BY MOUTH 2 TIMES DAILY. 60 capsule 2  . venlafaxine XR (EFFEXOR-XR) 150 MG 24 hr capsule TAKE 1 CAPSULE BY MOUTH EVERY DAY (Patient not taking: Reported on 09/20/2019) 90 capsule 1   No current facility-administered medications for this visit.     PHYSICAL EXAMINATION: ECOG PERFORMANCE STATUS: 2 - Symptomatic, <50% confined to bed Vitals:   09/20/19 1119  BP: (!) 125/51  Pulse: (!) 50  Resp: 18  Temp: (!) 96.2 F (35.7 C)   Filed Weights   09/20/19 1119  Weight: 154 lb 14.4 oz (70.3 kg)    Physical Exam  Constitutional: She is oriented to person, place, and time. No distress.  Frail female, walks with a walker  HENT:  Head: Normocephalic and atraumatic.  Nose: Nose normal.  Mouth/Throat: Oropharynx is clear and moist. No oropharyngeal exudate.  Eyes: Pupils are equal, round, and reactive to light. EOM are normal. No scleral icterus.  Cardiovascular: Normal rate and regular rhythm.  No murmur heard. Pulmonary/Chest: Effort normal. No respiratory distress. She has no wheezes.  Right anterior chest wall tenderness with palpation.  Abdominal: Soft. She exhibits no distension. There is no abdominal tenderness.  Musculoskeletal:        General: Edema present. Normal range of  motion.     Cervical back: Normal range of motion and neck supple.     Comments: Bilateral lower extremity 2+ edema  Neurological: She is alert and oriented to person, place, and time. No cranial nerve deficit. She exhibits normal muscle tone. Coordination normal.  Skin: Skin is warm and dry. She is not diaphoretic. No erythema.  Psychiatric: Affect normal.     CMP Latest Ref Rng & Units 09/20/2019  Glucose 70 - 99 mg/dL 124(H)  BUN 8 - 23 mg/dL 26(H)  Creatinine 0.44 - 1.00 mg/dL 1.07(H)  Sodium 135 - 145 mmol/L 141  Potassium 3.5 - 5.1 mmol/L 5.0  Chloride 98 - 111 mmol/L 107  CO2 22 - 32 mmol/L 27  Calcium 8.9 - 10.3 mg/dL 7.5(L)  Total Protein 6.5 - 8.1 g/dL 6.0(L)  Total Bilirubin 0.3 - 1.2 mg/dL 0.6  Alkaline Phos 38 - 126 U/L 109  AST 15 - 41 U/L 29  ALT 0 - 44 U/L 30   CBC Latest Ref Rng & Units 09/20/2019  WBC 4.0 - 10.5 K/uL 5.3  Hemoglobin 12.0 - 15.0 g/dL 10.6(L)  Hematocrit 36.0 - 46.0 % 33.0(L)  Platelets 150 - 400 K/uL 276    LABORATORY DATA:  I have reviewed the data as listed Lab Results  Component Value Date   WBC 5.3 09/20/2019   HGB 10.6 (L) 09/20/2019   HCT 33.0 (L) 09/20/2019   MCV 100.3 (H) 09/20/2019   PLT 276 09/20/2019   Recent Labs    07/15/19 1252 08/12/19 1017 09/20/19 1105  NA 141 136 141  K 4.3 4.0 5.0  CL 105 102 107  CO2 _0 GLUCOSE 100* 97 124*  BUN 24* 23 26*  CREATININE 1.03* 1.08* 1.07*  CALCIUM 8.3* 8.1* 7.5*  GFRNONAA 51* 48* 49*  GFRAA 59* 56* 56*  PROT 6.0* 6.0* 6.0*  ALBUMIN 2.9* 3.1* 3.1*  AST 25  31 29  ALT 32 35 30  ALKPHOS 123 133* 109  BILITOT 0.5 0.7 0.6   Iron/TIBC/Ferritin/ %Sat No results found for: IRON, TIBC, FERRITIN, IRONPCTSAT   RADIOGRAPHIC STUDIES: I have personally reviewed the radiological images as listed and agreed with the findings in the report. DG Ribs Unilateral W/Chest Right  Result Date: 07/15/2019 CLINICAL DATA:  Fall 1 week ago.  Right chest wall pain. EXAM: RIGHT RIBS AND  CHEST - 3+ VIEW COMPARISON:  06/14/2019 FINDINGS: Multiple right rib fractures. Mildly displaced fracture of the lateral right sixth rib is unchanged compared to the prior chest radiograph. Posterior fractures of the right third and fourth ribs appear new since the prior radiographs. There is a fracture across the posterolateral right seventh rib, and there are recent appearing fractures of the anterolateral right eighth and ninth ribs. No bone lesions. Cardiac silhouette is normal in size. No mediastinal or hilar masses. Mild opacity at the right lung base is likely atelectasis. Stable granuloma noted in the left upper lobe. Lungs are otherwise clear. No pneumothorax. IMPRESSION: 1. Multiple rib fractures as described, with fractures of the posterolateral seventh, anterolateral eighth and ninth, and posterior third and fourth ribs appearing recent and new since the prior chest radiograph. 2. Mild opacity at the right lung base is likely atelectasis. 3. No pneumothorax. Electronically Signed   By: Lajean Manes M.D.   On: 07/15/2019 13:30      ASSESSMENT & PLAN:  1. Malignant neoplasm of left female breast, unspecified estrogen receptor status, unspecified site of breast ([place])   2. Primary adenocarcinoma of left lung [facility])   Cancer Staging Malignant neoplasm of left female breast [facility]) Staging form: Breast, AJCC 8th Edition - Clinical stage from 12/17/2018: Stage IB (cT1b, cN1, cM0, G1, ER+, PR+, HER2-) - Signed by Earlie Server, MD on 12/17/2018  Primary adenocarcinoma of lung [facility]) Staging form: Lung, AJCC 8th Edition - Clinical stage from 09/28/2018: Stage IVA (cT2, cN3, cM1a) - Signed by Earlie Server, MD on 09/28/2018   Patient has 2 primaries.   Stage IV lung adenocarcinoma, met fusion mutation cT2 N3 M1a PDL 1 TPS more than 70%  Patient has been doing well, clinically stable. Labs reviewed and discussed with patient.  Continue crizotinib. Obtain CT chest abdomen pelvis for surveillance. #Breast  cancer, continue Arimidex. #Bilateral lower extremity edema, continue compression stocking.  Lasix has been limited to high fall risk and has been discontinued. #Frequent falls, patient has been referred to physical therapy for prevention program. #Rib fracture secondary to trauma  #Osteopenia, . Stage IV lung and breast cancer. Delton See was given on 08/12/2019. Hold Xgeva due to hypocalcemia.  Recommend patient to continue calcium and vitamin D supplementation .All questions were answered. The patient knows to call the clinic with any problems questions or concerns. Return of visit: 4 weeks.   Earlie Server, MD, PhD 09/20/2019

## 2019-09-20 NOTE — Progress Notes (Signed)
Patient here for follow up. Denies any new breast problems.

## 2019-09-21 ENCOUNTER — Encounter: Payer: Self-pay | Admitting: Family Medicine

## 2019-09-21 ENCOUNTER — Ambulatory Visit (INDEPENDENT_AMBULATORY_CARE_PROVIDER_SITE_OTHER): Payer: Medicare Other | Admitting: Family Medicine

## 2019-09-21 ENCOUNTER — Ambulatory Visit (INDEPENDENT_AMBULATORY_CARE_PROVIDER_SITE_OTHER)
Admission: RE | Admit: 2019-09-21 | Discharge: 2019-09-21 | Disposition: A | Discharge status: Home / Self Care | Payer: Medicare Other | Source: Ambulatory Visit | Attending: Family Medicine | Admitting: Family Medicine

## 2019-09-21 VITALS — BP 130/62 | HR 63 | Temp 98.1°F | Ht 63.0 in | Wt 153.5 lb

## 2019-09-21 DIAGNOSIS — M25552 Pain in left hip: Secondary | ICD-10-CM

## 2019-09-21 DIAGNOSIS — M7062 Trochanteric bursitis, left hip: Secondary | ICD-10-CM

## 2019-09-21 MED ORDER — METHYLPREDNISOLONE ACETATE 40 MG/ML IJ SUSP
80.0000 mg | Freq: Once | INTRAMUSCULAR | Status: AC
  Administered 2019-09-21: 13:00:00 80 mg via INTRA_ARTICULAR

## 2019-09-21 NOTE — Progress Notes (Signed)
Shatha Hooser T. Shaunee Mulkern, MD, [facility]  Primary Care and Sports Medicine [facility] [place], [address]  Phone: [phone]  FAX: [phone]  Jamie Matthews - 79 y.o. female  MRN [id number]  Date of Birth: 1940-12-09  Date: 09/21/2019  PCP: [name], MD  Referral: [name], MD  Chief Complaint  Patient presents with  . Hip Pain    Left    This visit occurred during the SARS-CoV-2 public health emergency.  Safety protocols were in place, including screening questions prior to the visit, additional usage of staff PPE, and extensive cleaning of exam room while observing appropriate contact time as indicated for disinfecting solutions.   Subjective:   Jamie Matthews is a 79 y.o. very pleasant female patient with Body mass index is 27.19 kg/m. who presents with the following:  New L sided hip pain: She is a pleasant 79 year old, and I saw her approximately 5 years ago.  She presents today with a new problem to me for evaluation of left hip pain.  She saw my partner Dr. Damita Dunnings about 5 or 6 days ago, and she has tried some basic ice.  She is not improved.  She does complain of pain in the lateral aspect of her left hip.  She also complains of groin pain as well.  She thinks that this started about 6 months ago.  The radiological images were independently reviewed by myself in the office and results were reviewed with the patient. My independent interpretation of images:   I also reviewed her prior and current hip x-rays.  Prior right-sided hip series from August 2020 shows an easily seen right-sided superior pubic rami fracture as well as inferior pubic rami fracture.  Today on her repeat x-rays these are visualized in terms of a healed fracture.  On the left series there is no evidence of fracture or dislocation, and no significant degenerative joint disease Electronically Signed   By: Owens Loffler, MD On: 09/21/2019 12:00 PM EDT   She has not had any traumatic injury or fall.  Aside from basic icing and Tylenol the patient has not tried any other things at home to alleviate her symptoms.  Review of Systems is noted in the HPI, as appropriate   Objective:   BP 130/62   Pulse 63   Temp 98.1 F (36.7 C) (Temporal)   Ht 5\' 3"  (1.6 m)   Wt 153 lb 8 oz (69.6 kg)   SpO2 93%   BMI 27.19 kg/m    GEN: No acute distress; alert,appropriate. PULM: Breathing comfortably in no respiratory distress PSYCH: Normally interactive.   HIP EXAM: SIDE: Left ROM: Abduction, Flexion, Internal and External range of motion: Full Pain with terminal IROM and EROM: None GTB: Notably tender on the left side SLR: NEG Knees: No effusion FABER: NT REVERSE FABER: NT, neg Piriformis: NT at direct palpation Str: flexion: 5/5 abduction: 5/5 adduction: 5/5 Strength testing non-tender  She does walk with a walker, and she says this is her baseline.  Radiology: DG HIP UNILAT WITH PELVIS 2-3 VIEWS LEFT  Result Date: 09/21/2019 CLINICAL DATA:  Left hip pain, history of pubic ramus fracture EXAM: DG HIP (WITH OR WITHOUT PELVIS) 2-3V LEFT COMPARISON:  09/08/2018 FINDINGS: Pelvic ring is intact. Healed superior and inferior pubic rami fractures on the right are noted. Degenerative changes of lumbar spine are seen. No acute fracture or dislocation is noted. No soft  tissue changes are noted. IMPRESSION: Healed pubic rami fractures on the right. No acute abnormality noted. Electronically Signed   By: Inez Catalina M.D.   On: 09/21/2019 21:37    Assessment and Plan:     ICD-10-CM   1. Trochanteric bursitis of left hip  M70.62   2. Left hip pain  M25.552 DG HIP UNILAT WITH PELVIS 2-3 VIEWS LEFT    methylPREDNISolone acetate (DEPO-MEDROL) injection 80 mg   This is not an intra-articular process.  The patient's range of motion is remarkably good for age 78.  Her strength is excellent.  This  is almost certainly secondary to altered gait mechanics after she has used a walker for this past year.  I reviewed with the patient the structures involved and how they related to diagnosis.   I appreciate the opportunity to evaluate this very friendly patient. If you have any question regarding her care or prognosis, do not hesitate to ask.   Aspiration/Injection Procedure Note Jamie Matthews 10/16/1940 Date of procedure: 09/21/2019  Procedure: Large Joint Aspiration / Injection of Hip, Trochanteric Bursa, L Indications: Pain  Procedure Details Verbal consent obtained. Risks (including infection, potential atrophy), benefits, and alternatives reviewed. Greater trochanter sterilely prepped with Chloraprep. Ethyl Chloride used for anesthesia. 8 cc of Lidocaine 1% injected with 2 mL of Depo-Medrol 40 mg into trochanteric bursa at area of maximal tenderness at greater trochanter. Needle taken to bone to troch bursa, flows easily. Bursa massaged. No bleeding and no complications. Decreased pain after injection. Needle: 22 gauge spinal needle Medication: 2 mL of Depo-Medrol 40 mg, equaling Depo-Medrol 80 mg total   Follow-up: As needed only  Meds ordered this encounter  Medications  . methylPREDNISolone acetate (DEPO-MEDROL) injection 80 mg   There are no discontinued medications. Orders Placed This Encounter  Procedures  . DG HIP UNILAT WITH PELVIS 2-3 VIEWS LEFT    Signed,  Justice Milliron T. Juliette Standre, MD   Outpatient Encounter Medications as of 09/21/2019  Medication Sig  . acetaminophen (TYLENOL) 325 MG tablet Take 325 mg by mouth every 6 (six) hours as needed for mild pain.   [name] anastrozole (ARIMIDEX) 1 MG tablet TAKE 1 TABLET BY MOUTH DAILY  . atorvastatin (LIPITOR) 20 MG tablet Take 1 tablet (20 mg total) by mouth at bedtime.  . benzonatate (TESSALON) 100 MG capsule Take 1 capsule (100 mg total) by mouth 3 (three) times daily as needed for cough.  [name] buPROPion  (WELLBUTRIN XL) 150 MG 24 hr tablet TAKE ONE TABLET BY MOUTH EVERY DAY  . Calcium-Magnesium-Vitamin D (CALCIUM 1200+D3 PO) Take 1 tablet by mouth daily.  [name] desonide (DESOWEN) 0.05 % lotion Apply 1 application topically as needed (after showering).   . Dexlansoprazole (DEXILANT) 30 MG capsule Take 1 capsule (30 mg total) by mouth daily.  [name] Dextromethorphan-guaiFENesin (MUCINEX DM) 30-600 MG TB12 Take 1 tablet by mouth 2 (two) times daily as needed (for congestion/cough).  . fluticasone (FLONASE) 50 MCG/ACT nasal spray Place 2 sprays into both nostrils daily.  [name] ketoconazole (NIZORAL) 2 % shampoo Apply 1 application topically. 3 times a week  . levothyroxine (SYNTHROID) 25 MCG tablet Take 1 tablet (25 mcg total) by mouth daily.  [name] lubiprostone (AMITIZA) 24 MCG capsule TAKE ONE CAPSULE TWICE A DAY WITH MEALS  . memantine (NAMENDA) 10 MG tablet TAKE ONE TABLET BY MOUTH TWICE DAILY  . mirabegron ER (MYRBETRIQ) 50 MG TB24 tablet Take 1 tablet (50 mg total) by mouth daily.  . Multiple Vitamin (MULTIVITAMIN  WITH MINERALS) TABS tablet Take 1 tablet by mouth daily.  . ondansetron (ZOFRAN) 8 MG tablet Take 8 mg by mouth every 8 (eight) hours as needed for nausea.  . polyethylene glycol (MIRALAX / GLYCOLAX) packet Take 17 g by mouth daily.  . sucralfate (CARAFATE) 1 g tablet Take 1 tablet (1 g total) by mouth 2 (two) times daily.  [name] triamcinolone (NASACORT) 55 MCG/ACT AERO nasal inhaler Place 2 sprays into the nose daily as needed (for congestion).   . venlafaxine XR (EFFEXOR-XR) 150 MG 24 hr capsule TAKE 1 CAPSULE BY MOUTH EVERY DAY  . XALKORI 250 MG capsule TAKE 1 CAPSULE (250 MG TOTAL) BY MOUTH 2 TIMES DAILY.  . [EXPIRED] methylPREDNISolone acetate (DEPO-MEDROL) injection 80 mg    No facility-administered encounter medications on file as of 09/21/2019.

## 2019-09-22 ENCOUNTER — Other Ambulatory Visit: Payer: Self-pay | Admitting: Oncology

## 2019-09-22 ENCOUNTER — Telehealth: Payer: Self-pay | Admitting: Adult Health Nurse Practitioner

## 2019-09-22 DIAGNOSIS — C3490 Malignant neoplasm of unspecified part of unspecified bronchus or lung: Secondary | ICD-10-CM

## 2019-09-22 NOTE — Telephone Encounter (Signed)
Called patient's home # to schedule a Palliative Consult, no answer - left message with reason for call along with my name and contact number.

## 2019-09-23 ENCOUNTER — Other Ambulatory Visit: Payer: Self-pay

## 2019-09-23 ENCOUNTER — Ambulatory Visit (INDEPENDENT_AMBULATORY_CARE_PROVIDER_SITE_OTHER): Payer: Medicare Other | Admitting: Urology

## 2019-09-23 VITALS — BP 125/52 | HR 50 | Ht 63.0 in | Wt 153.0 lb

## 2019-09-23 DIAGNOSIS — N3281 Overactive bladder: Secondary | ICD-10-CM

## 2019-09-23 LAB — BLADDER SCAN AMB NON-IMAGING: Scan Result: 102

## 2019-09-23 MED FILL — XALKORI 250 MG CAPSULE: 250 | 30 days supply | Qty: 60 | Fill #0

## 2019-09-23 NOTE — Progress Notes (Signed)
09/23/2019 11:29 AM   [name] [birth year] [id number]  Referring provider: [name], MD [place],  [place] [address]  Chief Complaint  Patient presents with  . Over Active Bladder    HPI:  F/iu - OAB symptoms since 2014 - notes nocturia 3. She has daytime frequency voiding up to 6 times per day. She has urinary urgency with occasional urge incontinence. She finds this increasingly bothersome. She has been on Myrbetriq 50 mg for this for three years which did not help. She does have a history of a urethral sling for stress incontinence that was eventually removed in 2012 due to urinary retention. She denies problems with stress incontinence at this point. Her major concern are her overactive bladder symptoms.  PVR was 137 cc and bladder scan 102 ml today.   She was started on Vesicare 5 mg in 2018. She reported 25-50% improvement in her symptoms. She still has nocturia 3-4. She has residual urgency but is not as bad with less urge incontinence.   She returns and underwent a PET scan December 2020 and management of lung cancer which reported a "thick-walled bladder" but looking at the images myself I thought the bladder looked normal and I didn't appreciate any thickening or irregularity. She has developed some mild SUI when she will leak with a cough and sneeze. She wears a pad but it is dry. She has some urgency to get to the bathroom, especially in the morning. No gross hematuria. UA clear. She has turn key.   PMH: Past Medical History:  Diagnosis Date  . AKI (acute kidney injury) ([facility]) 08/17/2018  . Anxiety   . Arthritis   . Belching   . Bladder disorder    OVERACTIVE  . Bowel dysfunction    BLOCKAGE  . Cancer ([facility])    breast  . Constipation   . Depression   . Diverticulitis   . Fibromyalgia   . GERD (gastroesophageal reflux disease)   . Hyperlipidemia   . IBS (irritable bowel syndrome)   . Internal hemorrhoids   . Lung cancer  ([facility]) 2020  . Memory deficits   . Murmur    asymptomatic  . Pneumonia 11/18/12  . Urinary incontinence   . Vertigo     Surgical History: Past Surgical History:  Procedure Laterality Date  . AXILLARY LYMPH NODE BIOPSY Left 06/08/2018   INVASIVE MAMMARY CARCINOMA  . BLADDER SUSPENSION  2004, 2012  . BREAST BIOPSY Left 06/08/2018   INVASIVE MAMMARY CARCINOMA  . CATARACT EXTRACTION W/PHACO Right 08/27/2015   Procedure: CATARACT EXTRACTION PHACO AND INTRAOCULAR LENS PLACEMENT (IOC);  Surgeon: Estill Cotta, MD;  Location: [facility] ORS;  Service: Ophthalmology;  Laterality: Right;  [place]   1:00.2 AP%  22.5 CDE  23.67 fluid casette lot #[id number]  exp05/31/2018  . CATARACT EXTRACTION W/PHACO Left 10/15/2015   Procedure: CATARACT EXTRACTION PHACO AND INTRAOCULAR LENS PLACEMENT (IOC);  Surgeon: Estill Cotta, MD;  Location: [facility] ORS;  Service: Ophthalmology;  Laterality: Left;  [place] 01:07 AP% 18.1 CDE 21.57 fluid pack lot # [id number]  . CHOLECYSTECTOMY    . COLONOSCOPY  2017  . ELECTROMAGNETIC NAVIGATION BROCHOSCOPY N/A 07/09/2018   Procedure: ELECTROMAGNETIC NAVIGATION BRONCHOSCOPY;  Surgeon: Flora Lipps, MD;  Location: [facility] ORS;  Service: Cardiopulmonary;  Laterality: N/A;  . TONSILLECTOMY  1947    Home Medications:  Allergies as of 09/23/2019      Reactions   Sulfa Antibiotics Rash      Medication List  Accurate as of September 23, 2019 11:29 AM. If you have any questions, ask your nurse or doctor.        acetaminophen 325 MG tablet Commonly known as: TYLENOL Take 325 mg by mouth every 6 (six) hours as needed for mild pain.   anastrozole 1 MG tablet Commonly known as: ARIMIDEX TAKE 1 TABLET BY MOUTH DAILY   atorvastatin 20 MG tablet Commonly known as: LIPITOR Take 1 tablet (20 mg total) by mouth at bedtime.   benzonatate 100 MG capsule Commonly known as: TESSALON Take 1 capsule (100 mg total) by mouth 3 (three) times daily as needed for cough.   buPROPion 150 MG 24  hr tablet Commonly known as: WELLBUTRIN XL TAKE ONE TABLET BY MOUTH EVERY DAY   CALCIUM 1200+D3 PO Take 1 tablet by mouth daily.   desonide 0.05 % lotion Commonly known as: DESOWEN Apply 1 application topically as needed (after showering).   Dexilant 30 MG capsule Generic drug: Dexlansoprazole Take 1 capsule (30 mg total) by mouth daily.   fluticasone 50 MCG/ACT nasal spray Commonly known as: FLONASE Place 2 sprays into both nostrils daily.   ketoconazole 2 % shampoo Commonly known as: NIZORAL Apply 1 application topically. 3 times a week   levothyroxine 25 MCG tablet Commonly known as: SYNTHROID Take 1 tablet (25 mcg total) by mouth daily.   lubiprostone 24 MCG capsule Commonly known as: Amitiza TAKE ONE CAPSULE TWICE A DAY WITH MEALS   memantine 10 MG tablet Commonly known as: NAMENDA TAKE ONE TABLET BY MOUTH TWICE DAILY   mirabegron ER 50 MG Tb24 tablet Commonly known as: Myrbetriq Take 1 tablet (50 mg total) by mouth daily.   Mucinex DM 30-600 MG Tb12 Take 1 tablet by mouth 2 (two) times daily as needed (for congestion/cough).   multivitamin with minerals Tabs tablet Take 1 tablet by mouth daily.   ondansetron 8 MG tablet Commonly known as: ZOFRAN Take 8 mg by mouth every 8 (eight) hours as needed for nausea.   polyethylene glycol 17 g packet Commonly known as: MIRALAX / GLYCOLAX Take 17 g by mouth daily.   sucralfate 1 g tablet Commonly known as: Carafate Take 1 tablet (1 g total) by mouth 2 (two) times daily.   triamcinolone 55 MCG/ACT Aero nasal inhaler Commonly known as: NASACORT Place 2 sprays into the nose daily as needed (for congestion).   venlafaxine XR 150 MG 24 hr capsule Commonly known as: EFFEXOR-XR TAKE 1 CAPSULE BY MOUTH EVERY DAY   Xalkori 250 MG capsule Generic drug: crizotinib TAKE 1 CAPSULE (250 MG TOTAL) BY MOUTH 2 TIMES DAILY.       Allergies:  Allergies  Allergen Reactions  . Sulfa Antibiotics Rash    Family  History: Family History  Problem Relation Age of Onset  . Diabetes Father   . Heart disease Father   . Lymphoma Father   . Heart disease Mother   . Breast cancer Sister 19  . Colon cancer Neg Hx   . Esophageal cancer Neg Hx   . Rectal cancer Neg Hx   . Stomach cancer Neg Hx   . Bladder Cancer Neg Hx   . Kidney cancer Neg Hx     Social History:  reports that she has never smoked. She has never used smokeless tobacco. She reports current alcohol use. She reports that she does not use drugs.   Physical Exam: BP (!) 125/52   Pulse (!) 50   Ht 5\' 3"  (1.6 m)  Wt 153 lb (69.4 kg)   BMI 27.10 kg/m   Constitutional:  Alert and oriented, No acute distress. HEENT: [place] AT, moist mucus membranes.  Trachea midline, no masses. Cardiovascular: No clubbing, cyanosis, or edema. Respiratory: Normal respiratory effort, no increased work of breathing. GI: Abdomen is soft, nontender, nondistended, no abdominal masses GU: No CVA tenderness Skin: No rashes, bruises or suspicious lesions. Neurologic: Grossly intact, no focal deficits, moving all 4 extremities. Psychiatric: Normal mood and affect.  Laboratory Data: Lab Results  Component Value Date   WBC 5.3 09/20/2019   HGB 10.6 (L) 09/20/2019   HCT 33.0 (L) 09/20/2019   MCV 100.3 (H) 09/20/2019   PLT 276 09/20/2019    Lab Results  Component Value Date   CREATININE 1.07 (H) 09/20/2019    No results found for: PSA  No results found for: TESTOSTERONE  Lab Results  Component Value Date   HGBA1C 5.6 06/12/2014    Urinalysis    Component Value Date/Time   COLORURINE YELLOW (A) 10/10/2018 1548   APPEARANCEUR CLEAR (A) 10/10/2018 1548   APPEARANCEUR Clear 09/25/2016 0953   LABSPEC 1.006 10/10/2018 1548   LABSPEC 1.027 06/10/2014 1759   PHURINE 6.0 10/10/2018 [place] 10/10/2018 1548   GLUCOSEU Negative 06/10/2014 1759   HGBUR NEGATIVE 10/10/2018 [place] 10/10/2018 1548   BILIRUBINUR neg  08/20/2017 1134   BILIRUBINUR Negative 09/25/2016 0953   BILIRUBINUR Negative 06/10/2014 1759   KETONESUR NEGATIVE 10/10/2018 1548   PROTEINUR NEGATIVE 10/10/2018 1548   UROBILINOGEN 0.2 08/20/2017 1134   NITRITE NEGATIVE 10/10/2018 1548   LEUKOCYTESUR NEGATIVE 10/10/2018 1548   LEUKOCYTESUR Trace 06/10/2014 1759    Lab Results  Component Value Date   LABMICR See below: 09/25/2016   WBCUA 0-5 09/25/2016   RBCUA None seen 09/25/2016   LABEPIT 0-10 09/25/2016   MUCUS Present (A) 09/25/2016   BACTERIA Few (A) 09/25/2016    Pertinent Imaging: CT/PET images from dec 2020  No results found for this or any previous visit. No results found for this or any previous visit. No results found for this or any previous visit. No results found for this or any previous visit. No results found for this or any previous visit. No results found for this or any previous visit. No results found for this or any previous visit. No results found for this or any previous visit.  Assessment & Plan:    1. Overactive bladder Discussed the nature r/b of pelvic floor PT or going back on an OAB meds. She would like to avoid medicine. She is at [facility] and will check with their PT dept.  - Urinalysis, Complete - Bladder Scan (Post Void Residual) in office   No follow-ups on file.  Festus Aloe, MD  [facility] [address], [place], [place] [address] [phone]

## 2019-09-23 NOTE — Patient Instructions (Signed)
Your Home Program  Check with your physical therapist and see if they offer pelvic floor physical therapy to help with stress incontinence, urinary urgency and urge incontinence.   General Guidelines for Pelvic Floor Exercise  Challenge your muscles to do more than they are used to doing. The quality of the exercise is more important that the number you perform.  Avoid straining, holding your breath or using buttock or leg muscles while you exercise the pelvic floor muscles.  Count out loud and continue breathing to avoid straining.  Relax your body and breathe during your exercises.  Coordinate your breathing with your pelvic floor contraction by blowing out or exhaling while you contract your pelvic floor muscles.   Concentrate on activating both the sphincters and levator ani muscles of the pelvic floor with each exercise.  Position for the Exercises  Start lying down with your knees bent and supported with pillows.  Once you've gained awareness and can feel the contractions you may perform the exercises either sitting or standing.  For example, you can do them while driving, working on the computer, or waiting in lines.  Quick Contractions  Repeat this exercise 5 times.  Do the exercise 3 times per day.  Rapidly contract your pelvic floor muscles and hold for 2 seconds relax for 2 seconds.  Try to do the contraction on breathing exhalation.  Endurance Contractions  Repeat this 15 times.  Do the exercise 3 times per day.  Pull your pelvic floor muscles up and in and hold for 5 seconds then relax for 5 seconds.  Count out loud while you are holding the contraction to make sure that you are breathing throughout the exercise and not straining.    2007, [employer] Doc.37

## 2019-09-26 LAB — URINALYSIS, COMPLETE
Bilirubin, UA: NEGATIVE
Glucose, UA: NEGATIVE
Ketones, UA: NEGATIVE
Leukocytes,UA: NEGATIVE
Nitrite, UA: NEGATIVE
Protein,UA: NEGATIVE
RBC, UA: NEGATIVE
Specific Gravity, UA: 1.015 (ref 1.005–1.030)
Urobilinogen, Ur: 0.2 mg/dL (ref 0.2–1.0)
pH, UA: 5 (ref 5.0–7.5)

## 2019-09-26 LAB — MICROSCOPIC EXAMINATION
Bacteria, UA: NONE SEEN
RBC, Urine: NONE SEEN /hpf (ref 0–2)

## 2019-09-29 ENCOUNTER — Telehealth: Payer: Self-pay | Admitting: Adult Health Nurse Practitioner

## 2019-09-29 NOTE — Telephone Encounter (Signed)
Called patient's home number to schedule the Palliative Consult, no answer - no voicemail message came up and call ended.  I then called listed cell number and when someone answered the call was dropped.  I called the number back and no one answered, I left message with reason for call along with my name and contact number

## 2019-10-04 ENCOUNTER — Telehealth: Payer: Self-pay | Admitting: Adult Health Nurse Practitioner

## 2019-10-04 NOTE — Telephone Encounter (Signed)
Called patient's home number to schedule the Palliative Consult, no answer - left message with reason for call along with my name and contact number.  I also called listed cell number, but it rang a few times and then the call ended.

## 2019-10-05 ENCOUNTER — Telehealth: Payer: Self-pay | Admitting: Family Medicine

## 2019-10-05 NOTE — Chronic Care Management (AMB) (Signed)
  Chronic Care Management   Note  10/05/2019 Name: Jamie Matthews MRN: [id number] DOB: [birth year]  Jamie Matthews Jamie Matthews is a 80 y.o. year old female who is a primary care patient of Einar Pheasant Jobe Marker, MD. I reached out to [name] by phone today in response to a referral sent by Jamie Matthews's PCP, [name], MD.   [name] was given information about Chronic Care Management services today including:  1. CCM service includes personalized support from designated clinical staff supervised by her physician, including individualized plan of care and coordination with other care providers 2. 24/7 contact phone numbers for assistance for urgent and routine care needs. 3. Service will only be billed when office clinical staff spend 20 minutes or more in a month to coordinate care. 4. Only one practitioner may furnish and bill the service in a calendar month. 5. The patient may stop CCM services at any time (effective at the end of the month) by phone call to the office staff.   Patient agreed to services and verbal consent obtained.   Follow up plan:   [place]

## 2019-10-06 ENCOUNTER — Other Ambulatory Visit: Payer: Self-pay

## 2019-10-06 ENCOUNTER — Telehealth: Payer: Self-pay | Admitting: *Deleted

## 2019-10-06 DIAGNOSIS — C50912 Malignant neoplasm of unspecified site of left female breast: Secondary | ICD-10-CM

## 2019-10-06 NOTE — Telephone Encounter (Signed)
Left message on patient voice mail for appointment tomorrow for LAB/ physician at 2 and 2:15 and to please call me back tp conform appt

## 2019-10-06 NOTE — Telephone Encounter (Signed)
Patient called back to confirm appts

## 2019-10-06 NOTE — Telephone Encounter (Signed)
Per Dr. Tasia Catchings, if pt is willing to come in today she can be seen by symptom management or she can see her tomorrow. Recommend for her to try antiemetic med for nausea.

## 2019-10-06 NOTE — Telephone Encounter (Signed)
Patient called reporting that she has been intermittently vomiting since yesterday and that she has not been able to eat today. When questioned about any other symptoms, she reports that she has a headache and that she has had constipation. Denies any cramping or abdominal pain. She does have nausea medicine on hand, but has not taken any, she stated she will go get it and take some now. Please advise

## 2019-10-07 ENCOUNTER — Other Ambulatory Visit: Payer: Self-pay

## 2019-10-07 ENCOUNTER — Inpatient Hospital Stay: Payer: Medicare Other | Attending: Oncology | Admitting: Oncology

## 2019-10-07 ENCOUNTER — Inpatient Hospital Stay: Payer: Medicare Other

## 2019-10-07 ENCOUNTER — Encounter: Payer: Self-pay | Admitting: Oncology

## 2019-10-07 VITALS — BP 100/57 | HR 54 | Temp 94.9°F | Resp 18

## 2019-10-07 DIAGNOSIS — M8580 Other specified disorders of bone density and structure, unspecified site: Secondary | ICD-10-CM | POA: Diagnosis not present

## 2019-10-07 DIAGNOSIS — F329 Major depressive disorder, single episode, unspecified: Secondary | ICD-10-CM | POA: Diagnosis not present

## 2019-10-07 DIAGNOSIS — N179 Acute kidney failure, unspecified: Secondary | ICD-10-CM

## 2019-10-07 DIAGNOSIS — Z79899 Other long term (current) drug therapy: Secondary | ICD-10-CM | POA: Insufficient documentation

## 2019-10-07 DIAGNOSIS — Z8249 Family history of ischemic heart disease and other diseases of the circulatory system: Secondary | ICD-10-CM | POA: Diagnosis not present

## 2019-10-07 DIAGNOSIS — Z882 Allergy status to sulfonamides status: Secondary | ICD-10-CM | POA: Insufficient documentation

## 2019-10-07 DIAGNOSIS — C50912 Malignant neoplasm of unspecified site of left female breast: Secondary | ICD-10-CM

## 2019-10-07 DIAGNOSIS — Z8719 Personal history of other diseases of the digestive system: Secondary | ICD-10-CM | POA: Diagnosis not present

## 2019-10-07 DIAGNOSIS — C3492 Malignant neoplasm of unspecified part of left bronchus or lung: Secondary | ICD-10-CM | POA: Diagnosis not present

## 2019-10-07 DIAGNOSIS — K5900 Constipation, unspecified: Secondary | ICD-10-CM | POA: Diagnosis not present

## 2019-10-07 DIAGNOSIS — K219 Gastro-esophageal reflux disease without esophagitis: Secondary | ICD-10-CM | POA: Diagnosis not present

## 2019-10-07 DIAGNOSIS — Z833 Family history of diabetes mellitus: Secondary | ICD-10-CM | POA: Diagnosis not present

## 2019-10-07 DIAGNOSIS — Z79811 Long term (current) use of aromatase inhibitors: Secondary | ICD-10-CM | POA: Insufficient documentation

## 2019-10-07 DIAGNOSIS — Z170 Estrogen receptor positive status [ER+]: Secondary | ICD-10-CM | POA: Diagnosis not present

## 2019-10-07 DIAGNOSIS — R519 Headache, unspecified: Secondary | ICD-10-CM | POA: Insufficient documentation

## 2019-10-07 DIAGNOSIS — M7989 Other specified soft tissue disorders: Secondary | ICD-10-CM | POA: Insufficient documentation

## 2019-10-07 DIAGNOSIS — E785 Hyperlipidemia, unspecified: Secondary | ICD-10-CM | POA: Diagnosis not present

## 2019-10-07 DIAGNOSIS — C3412 Malignant neoplasm of upper lobe, left bronchus or lung: Secondary | ICD-10-CM | POA: Insufficient documentation

## 2019-10-07 DIAGNOSIS — R112 Nausea with vomiting, unspecified: Secondary | ICD-10-CM | POA: Diagnosis not present

## 2019-10-07 DIAGNOSIS — C50812 Malignant neoplasm of overlapping sites of left female breast: Secondary | ICD-10-CM | POA: Diagnosis not present

## 2019-10-07 DIAGNOSIS — M47816 Spondylosis without myelopathy or radiculopathy, lumbar region: Secondary | ICD-10-CM | POA: Insufficient documentation

## 2019-10-07 DIAGNOSIS — R5383 Other fatigue: Secondary | ICD-10-CM | POA: Insufficient documentation

## 2019-10-07 DIAGNOSIS — Z803 Family history of malignant neoplasm of breast: Secondary | ICD-10-CM | POA: Diagnosis not present

## 2019-10-07 LAB — CBC WITH DIFFERENTIAL/PLATELET
Abs Immature Granulocytes: 0.06 10*3/uL (ref 0.00–0.07)
Basophils Absolute: 0.1 10*3/uL (ref 0.0–0.1)
Basophils Relative: 1 %
Eosinophils Absolute: 0.3 10*3/uL (ref 0.0–0.5)
Eosinophils Relative: 4 %
HCT: 39 % (ref 36.0–46.0)
Hemoglobin: 12.6 g/dL (ref 12.0–15.0)
Immature Granulocytes: 1 %
Lymphocytes Relative: 23 %
Lymphs Abs: 1.6 10*3/uL (ref 0.7–4.0)
MCH: 32.1 pg (ref 26.0–34.0)
MCHC: 32.3 g/dL (ref 30.0–36.0)
MCV: 99.2 fL (ref 80.0–100.0)
Monocytes Absolute: 1.1 10*3/uL — ABNORMAL HIGH (ref 0.1–1.0)
Monocytes Relative: 16 %
Neutro Abs: 3.9 10*3/uL (ref 1.7–7.7)
Neutrophils Relative %: 55 %
Platelets: 308 10*3/uL (ref 150–400)
RBC: 3.93 MIL/uL (ref 3.87–5.11)
RDW: 14.1 % (ref 11.5–15.5)
WBC: 7 10*3/uL (ref 4.0–10.5)
nRBC: 0 % (ref 0.0–0.2)

## 2019-10-07 LAB — COMPREHENSIVE METABOLIC PANEL
ALT: 34 U/L (ref 0–44)
AST: 25 U/L (ref 15–41)
Albumin: 3.2 g/dL — ABNORMAL LOW (ref 3.5–5.0)
Alkaline Phosphatase: 101 U/L (ref 38–126)
Anion gap: 5 (ref 5–15)
BUN: 17 mg/dL (ref 8–23)
CO2: 28 mmol/L (ref 22–32)
Calcium: 7.6 mg/dL — ABNORMAL LOW (ref 8.9–10.3)
Chloride: 107 mmol/L (ref 98–111)
Creatinine, Ser: 0.96 mg/dL (ref 0.44–1.00)
GFR calc Af Amer: >60 mL/min (ref >60)
GFR calc non Af Amer: 55 mL/min — ABNORMAL LOW (ref >60)
Glucose, Bld: 87 mg/dL (ref 70–99)
Potassium: 3.8 mmol/L (ref 3.5–5.1)
Sodium: 140 mmol/L (ref 135–145)
Total Bilirubin: 0.7 mg/dL (ref 0.3–1.2)
Total Protein: 6.5 g/dL (ref 6.5–8.1)

## 2019-10-07 MED ORDER — SODIUM CHLORIDE 0.9 % IV SOLN
Freq: Once | INTRAVENOUS | Status: AC
  Filled 2019-10-07: qty 250

## 2019-10-07 MED ORDER — ONDANSETRON HCL 4 MG/2ML IJ SOLN
8.0000 mg | Freq: Once | INTRAMUSCULAR | Status: AC
  Administered 2019-10-07: 8 mg via INTRAVENOUS
  Filled 2019-10-07: qty 4

## 2019-10-07 MED ORDER — ACETAMINOPHEN 325 MG PO TABS
ORAL_TABLET | ORAL | Status: AC
  Filled 2019-10-07: qty 2

## 2019-10-07 MED ORDER — ACETAMINOPHEN 325 MG PO TABS
650.0000 mg | ORAL_TABLET | Freq: Once | ORAL | Status: AC
  Administered 2019-10-07: 650 mg via ORAL

## 2019-10-07 NOTE — Progress Notes (Signed)
Pt here with complaints of nausea and constipation that has been going on for about 3 days. Pt has miralax but has not taken for the past 2 days. No headache today. Denies shortness of breath or pain. No new breast problems.

## 2019-10-08 NOTE — Progress Notes (Signed)
Hematology/Oncology follow up note [facility] Telephone:([phone] Fax:(336) [phone]   Patient Care Team: [name], MD as PCP - General (Family Medicine) Osborne Oman, MD as Consulting Physician (Obstetrics and Gynecology) Pyrtle, Lajuan Lines, MD as Consulting Physician (Gastroenterology) Telford Nab, RN as Registered Nurse Earlie Server, MD as Consulting Physician (Hematology and Oncology) Debbora Dus, [facility] as Pharmacist (Pharmacist)   REASON FOR VISIT:  Follow up for management of lung cancer and breast cancer  HISTORY OF PRESENTING ILLNESS:  Jamie Matthews is a  79 y.o.  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 05/05/2018 bilateral diagnostic breast mammogram showed suspicious mass 1.1cm  in the 12:00 retroareolar region of the left breast and the left axillary lymph node. 06/08/2018 patient status post a left breast retroareolar and left axillary lymph node biopsy. Pathology showed invasive mammary carcinoma, no special type, grade 1, left axillary lymph node positive for invasive mammary carcinoma clinically metastatic.  Background lymph node architecture is not identified. ER> 90% PR> 90%, HER-2 negative.  PET scan done which unfortunately showed additional hypermetabolic bilateral hilar lymph nodes as well as left upper lobe lung mass which may represent a focus of metastatic disease from breast, or primary lung neoplasm.  There is multiple additional small pulmonary nodules are scattered throughout both lungs which are worrisome for metastasis.  Index nodule within the medial right upper lobe measures 7 mm,, peri-broncho-vascular nodule in the right lower lobe measures 1.2 cm.  # Stage IV lung cancer-  Biopsy of lung mass left upper lobe showed non-small cell lung cancer, favor adenocarcinoma.  #NGS came back patient has PD L1 is 70% TPS, MET fusion mutation.  #Mid-March 2020 started on  crizotinib  Bilateral lower extremity edema, right >left, Ultrasound venous right 09/20/2018 no DVT.  2D echo 10/10/2018 showed LVEF 55 to 60% Most likely secondary to crizotinib side effects.  # 12/15/2018 CT chest images were independently reviewed and discussed with patient. Dominant left upper lobe nodule and multiple scattered irregular pulmonary parenchymal nodular lesions are stable. Mild basilar pulmonary parenchymal septal thickening is new and can be seen with pulmonary edema. Patient reports no shortness of breath asymptomatic  #12/29/2018 unilateral left diagnostic mammogram with ultrasound images were independently reviewed and discussed with patient. Slight interval reduction of the size of the left retroareolar breast mass, 9 x 6 x 7 mm, comparing to 10 x 9 x 8 mm. Axillary lymph node measured 1.3 x 1.0 x 1.0 cm, previously 2.2 x 1.4 x 1.5 cm,  there were 2 other abdominal lymph nodes in the left axilla, which were not reported in previous ultrasound. Discussed with patient that overall, breast cancer responded to endocrine treatments.  Additional 2 lymph nodes need to be closely monitored.  Recommend patient to continue Arimidex.   INTERVAL HISTORY Jamie Matthews is a 79 y.o. female who has above history reviewed by me today presents for acute visit for nausea, and constipation.   Nausea started a few day ago, no triggering factors. She vomited once yesterday.  Today she is able to eat and drink a little bit.  Constipation for the past week, denies any pain. She did not take her laxatives.  Chronic leg swelling, no change of her symptoms.   .   Review of Systems  Constitutional: Positive for fatigue. Negative for appetite change, chills and fever.  HENT:   Negative for hearing loss and voice change.  Eyes: Negative for eye problems.  Respiratory: Negative for chest tightness and cough.   Cardiovascular: Positive for leg swelling. Negative for chest pain.    Gastrointestinal: Positive for constipation and nausea. Negative for abdominal distention, abdominal pain and blood in stool.  Endocrine: Negative for hot flashes.  Genitourinary: Negative for difficulty urinating and frequency.   Musculoskeletal: Negative for arthralgias.  Skin: Negative for itching and rash.  Neurological: Negative for extremity weakness.  Hematological: Negative for adenopathy.  Psychiatric/Behavioral: Negative for confusion and sleep disturbance.    MEDICAL HISTORY:  Past Medical History:  Diagnosis Date  . AKI (acute kidney injury) (HCC) 08/17/2018  . Anxiety   . Arthritis   . Belching   . Bladder disorder    OVERACTIVE  . Bowel dysfunction    BLOCKAGE  . Cancer (HCC)    breast  . Constipation   . Depression   . Diverticulitis   . Fibromyalgia   . GERD (gastroesophageal reflux disease)   . Hyperlipidemia   . IBS (irritable bowel syndrome)   . Internal hemorrhoids   . Lung cancer (HCC) 2020  . Memory deficits   . Murmur    asymptomatic  . Pneumonia 11/18/12  . Urinary incontinence   . Vertigo     SURGICAL HISTORY: Past Surgical History:  Procedure Laterality Date  . AXILLARY LYMPH NODE BIOPSY Left 06/08/2018   INVASIVE MAMMARY CARCINOMA  . BLADDER SUSPENSION  2004, 2012  . BREAST BIOPSY Left 06/08/2018   INVASIVE MAMMARY CARCINOMA  . CATARACT EXTRACTION W/PHACO Right 08/27/2015   Procedure: CATARACT EXTRACTION PHACO AND INTRAOCULAR LENS PLACEMENT (IOC);  Surgeon: Steven Dingeldein, MD;  Location: [facility] ORS;  Service: Ophthalmology;  Laterality: Right;  US   1:00.2 AP%  22.5 CDE  23.67 fluid casette lot #[id number]  exp05/31/2018  . CATARACT EXTRACTION W/PHACO Left 10/15/2015   Procedure: CATARACT EXTRACTION PHACO AND INTRAOCULAR LENS PLACEMENT (IOC);  Surgeon: Steven Dingeldein, MD;  Location: [facility] ORS;  Service: Ophthalmology;  Laterality: Left;  US 01:07 AP% 18.1 CDE 21.57 fluid pack lot # [id number]  . CHOLECYSTECTOMY    . COLONOSCOPY  2017   . ELECTROMAGNETIC NAVIGATION BROCHOSCOPY N/A 07/09/2018   Procedure: ELECTROMAGNETIC NAVIGATION BRONCHOSCOPY;  Surgeon: Kasa, Kurian, MD;  Location: [facility] ORS;  Service: Cardiopulmonary;  Laterality: N/A;  . TONSILLECTOMY  1947    SOCIAL HISTORY: Social History   Socioeconomic History  . Marital status: Married    Spouse name: Pieter  . Number of children: 0  . Years of education: Master's Degree  . Highest education level: Not on file  Occupational History  . Occupation: Retired  Tobacco Use  . Smoking status: Never Smoker  . Smokeless tobacco: Never Used  Substance and Sexual Activity  . Alcohol use: Yes    Comment: rare wine  . Drug use: No  . Sexual activity: Not Currently  Other Topics Concern  . Not on file  Social History Narrative   Recently moved with her husband to [place] from [place].   Husband is a retired dentist.   No children.   She is a retired teacher.   Enjoys: piano lessons, reading - mysteries and biographies, cooking   Exercise: walking, gardening   Diet: low appetite due to cancer treatment, grazing   She is a DNR.   Social Determinants of Health   Financial Resource Strain:   . Difficulty of Paying Living Expenses:   Food Insecurity:   . Worried About Running Out of Food in the Last Year:   .   Ran Out of Food in the Last Year:   Transportation Needs:   . Lack of Transportation (Medical):   . Lack of Transportation (Non-Medical):   Physical Activity:   . Days of Exercise per Week:   . Minutes of Exercise per Session:   Stress:   . Feeling of Stress :   Social Connections:   . Frequency of Communication with Friends and Family:   . Frequency of Social Gatherings with Friends and Family:   . Attends Religious Services:   . Active Member of Clubs or Organizations:   . Attends Club or Organization Meetings:   . Marital Status:   Intimate Partner Violence:   . Fear of Current or Ex-Partner:   . Emotionally Abused:   . Physically  Abused:   . Sexually Abused:     FAMILY HISTORY: Family History  Problem Relation Age of Onset  . Diabetes Father   . Heart disease Father   . Lymphoma Father   . Heart disease Mother   . Breast cancer Sister 58  . Colon cancer Neg Hx   . Esophageal cancer Neg Hx   . Rectal cancer Neg Hx   . Stomach cancer Neg Hx   . Bladder Cancer Neg Hx   . Kidney cancer Neg Hx     ALLERGIES:  is allergic to sulfa antibiotics.  MEDICATIONS:  Current Outpatient Medications  Medication Sig Dispense Refill  . acetaminophen (TYLENOL) 325 MG tablet Take 325 mg by mouth every 6 (six) hours as needed for mild pain.     . anastrozole (ARIMIDEX) 1 MG tablet TAKE 1 TABLET BY MOUTH DAILY 30 tablet 3  . atorvastatin (LIPITOR) 20 MG tablet Take 1 tablet (20 mg total) by mouth at bedtime. 90 tablet 3  . benzonatate (TESSALON) 100 MG capsule Take 1 capsule (100 mg total) by mouth 3 (three) times daily as needed for cough. 60 capsule 0  . buPROPion (WELLBUTRIN XL) 150 MG 24 hr tablet TAKE ONE TABLET BY MOUTH EVERY DAY 90 tablet 2  . Calcium-Magnesium-Vitamin D (CALCIUM 1200+D3 PO) Take 1 tablet by mouth daily.    . desonide (DESOWEN) 0.05 % lotion Apply 1 application topically as needed (after showering).     . Dexlansoprazole (DEXILANT) 30 MG capsule Take 1 capsule (30 mg total) by mouth daily. 90 capsule 3  . Dextromethorphan-guaiFENesin (MUCINEX DM) 30-600 MG TB12 Take 1 tablet by mouth 2 (two) times daily as needed (for congestion/cough).    . fluticasone (FLONASE) 50 MCG/ACT nasal spray Place 2 sprays into both nostrils daily. 16 g 6  . ketoconazole (NIZORAL) 2 % shampoo Apply 1 application topically. 3 times a week    . levothyroxine (SYNTHROID) 25 MCG tablet Take 1 tablet (25 mcg total) by mouth daily. 30 tablet 1  . lubiprostone (AMITIZA) 24 MCG capsule TAKE ONE CAPSULE TWICE A DAY WITH MEALS 60 capsule 3  . memantine (NAMENDA) 10 MG tablet TAKE ONE TABLET BY MOUTH TWICE DAILY 180 tablet 2  .  mirabegron ER (MYRBETRIQ) 50 MG TB24 tablet Take 1 tablet (50 mg total) by mouth daily. 90 tablet 1  . Multiple Vitamin (MULTIVITAMIN WITH MINERALS) TABS tablet Take 1 tablet by mouth daily.    . ondansetron (ZOFRAN) 8 MG tablet Take 8 mg by mouth every 8 (eight) hours as needed for nausea.    . sucralfate (CARAFATE) 1 g tablet Take 1 tablet (1 g total) by mouth 2 (two) times daily. 60 tablet 0  . triamcinolone (NASACORT)   55 MCG/ACT AERO nasal inhaler Place 2 sprays into the nose daily as needed (for congestion).     . venlafaxine XR (EFFEXOR-XR) 150 MG 24 hr capsule TAKE 1 CAPSULE BY MOUTH EVERY DAY 90 capsule 1  . XALKORI 250 MG capsule TAKE 1 CAPSULE (250 MG TOTAL) BY MOUTH 2 TIMES DAILY. 60 capsule 2  . polyethylene glycol (MIRALAX / GLYCOLAX) packet Take 17 g by mouth daily.     No current facility-administered medications for this visit.     PHYSICAL EXAMINATION: ECOG PERFORMANCE STATUS: 2 - Symptomatic, <50% confined to bed Vitals:   10/07/19 1406  BP: (!) 100/57  Pulse: (!) 54  Resp: 18  Temp: (!) 94.9 F (34.9 C)   Filed Weights    Physical Exam  Constitutional: She is oriented to person, place, and time. No distress.  Frail female, walks with a walker  HENT:  Head: Normocephalic and atraumatic.  Nose: Nose normal.  Mouth/Throat: Oropharynx is clear and moist. No oropharyngeal exudate.  Eyes: Pupils are equal, round, and reactive to light. EOM are normal. No scleral icterus.  Cardiovascular: Normal rate and regular rhythm.  No murmur heard. Pulmonary/Chest: Effort normal. No respiratory distress. She has no wheezes. She has no rales. She exhibits no tenderness.  Right anterior chest wall tenderness with palpation.  Abdominal: Soft. She exhibits no distension. There is no abdominal tenderness.  Musculoskeletal:        General: Edema present. Normal range of motion.     Cervical back: Normal range of motion and neck supple.     Comments: Bilateral lower extremity 2+  edema  Neurological: She is alert and oriented to person, place, and time. No cranial nerve deficit. She exhibits normal muscle tone. Coordination normal.  Skin: Skin is warm and dry. She is not diaphoretic. No erythema.  Psychiatric: Affect normal.     CMP Latest Ref Rng & Units 10/07/2019  Glucose 70 - 99 mg/dL 87  BUN 8 - 23 mg/dL 17  Creatinine 0.44 - 1.00 mg/dL 0.96  Sodium 135 - 145 mmol/L 140  Potassium 3.5 - 5.1 mmol/L 3.8  Chloride 98 - 111 mmol/L 107  CO2 22 - 32 mmol/L 28  Calcium 8.9 - 10.3 mg/dL 7.6(L)  Total Protein 6.5 - 8.1 g/dL 6.5  Total Bilirubin 0.3 - 1.2 mg/dL 0.7  Alkaline Phos 38 - 126 U/L 101  AST 15 - 41 U/L 25  ALT 0 - 44 U/L 34   CBC Latest Ref Rng & Units 10/07/2019  WBC 4.0 - 10.5 K/uL 7.0  Hemoglobin 12.0 - 15.0 g/dL 12.6  Hematocrit 36.0 - 46.0 % 39.0  Platelets 150 - 400 K/uL 308    LABORATORY DATA:  I have reviewed the data as listed Lab Results  Component Value Date   WBC 7.0 10/07/2019   HGB 12.6 10/07/2019   HCT 39.0 10/07/2019   MCV 99.2 10/07/2019   PLT 308 10/07/2019   Recent Labs    08/12/19 1017 09/20/19 1105 10/07/19 1348  NA 136 141 140  K 4.0 5.0 3.8  CL 102 107 107  CO2 25 27 28  GLUCOSE 97 124* 87  BUN 23 26* 17  CREATININE 1.08* 1.07* 0.96  CALCIUM 8.1* 7.5* 7.6*  GFRNONAA 48* 49* 55*  GFRAA 56* 56* >60  PROT 6.0* 6.0* 6.5  ALBUMIN 3.1* 3.1* 3.2*  AST 31 29 25  ALT 35 30 34  ALKPHOS 133* 109 101  BILITOT 0.7 0.6 0.7   Iron/TIBC/Ferritin/ %Sat   No results found for: IRON, TIBC, FERRITIN, IRONPCTSAT   RADIOGRAPHIC STUDIES: I have personally reviewed the radiological images as listed and agreed with the findings in the report. DG Ribs Unilateral W/Chest Right  Result Date: 07/15/2019 CLINICAL DATA:  Fall 1 week ago.  Right chest wall pain. EXAM: RIGHT RIBS AND CHEST - 3+ VIEW COMPARISON:  06/14/2019 FINDINGS: Multiple right rib fractures. Mildly displaced fracture of the lateral right sixth rib is unchanged  compared to the prior chest radiograph. Posterior fractures of the right third and fourth ribs appear new since the prior radiographs. There is a fracture across the posterolateral right seventh rib, and there are recent appearing fractures of the anterolateral right eighth and ninth ribs. No bone lesions. Cardiac silhouette is normal in size. No mediastinal or hilar masses. Mild opacity at the right lung base is likely atelectasis. Stable granuloma noted in the left upper lobe. Lungs are otherwise clear. No pneumothorax. IMPRESSION: 1. Multiple rib fractures as described, with fractures of the posterolateral seventh, anterolateral eighth and ninth, and posterior third and fourth ribs appearing recent and new since the prior chest radiograph. 2. Mild opacity at the right lung base is likely atelectasis. 3. No pneumothorax. Electronically Signed   By: Lajean Manes M.D.   On: 07/15/2019 13:30   DG HIP UNILAT WITH PELVIS 2-3 VIEWS LEFT  Result Date: 09/21/2019 CLINICAL DATA:  Left hip pain, history of pubic ramus fracture EXAM: DG HIP (WITH OR WITHOUT PELVIS) 2-3V LEFT COMPARISON:  09/08/2018 FINDINGS: Pelvic ring is intact. Healed superior and inferior pubic rami fractures on the right are noted. Degenerative changes of lumbar spine are seen. No acute fracture or dislocation is noted. No soft tissue changes are noted. IMPRESSION: Healed pubic rami fractures on the right. No acute abnormality noted. Electronically Signed   By: Inez Catalina M.D.   On: 09/21/2019 21:37      ASSESSMENT & PLAN:  1. Primary adenocarcinoma of left lung ([place])   2. Malignant neoplasm of left female breast, unspecified estrogen receptor status, unspecified site of breast ([place])   3. Non-intractable vomiting with nausea, unspecified vomiting type   Cancer Staging Malignant neoplasm of left female breast [facility]) Staging form: Breast, AJCC 8th Edition - Clinical stage from 12/17/2018: Stage IB (cT1b, cN1, cM0, G1, ER+, PR+, HER2-) -  Signed by Earlie Server, MD on 12/17/2018  Primary adenocarcinoma of lung [facility]) Staging form: Lung, AJCC 8th Edition - Clinical stage from 09/28/2018: Stage IVA (cT2, cN3, cM1a) - Signed by Earlie Server, MD on 09/28/2018   Patient has 2 primaries.   Stage IV lung adenocarcinoma, met fusion mutation cT2 N3 M1a PDL 1 TPS more than 70%  Breast Cancer,  Continue crizotinib and Arimidex.  Will obtain MRI brain images. She is also scheduled for CT scan.   # nausea, IV zofran 8m x 1.  IV fluid normal saline 500cc x 1  # Headache,  Tylenol 6555mQ6 hours as needed.   #Osteopenia, . Stage IV lung and breast cancer. XgDelton Seeas given on 08/12/2019. Hold Xgeva due to hypocalcemia.  Recommend patient to continue calcium and vitamin D supplementation  Her vitals are stable, symptoms improved. Patient released to home. Advise her to call if symptoms do not improve.   All questions were answered. The patient knows to call the clinic with any problems questions or concerns. Return of visit: keep her appointment.   ZhEarlie ServerMD, PhD 10/08/2019

## 2019-10-12 ENCOUNTER — Telehealth: Payer: Self-pay | Admitting: Adult Health Nurse Practitioner

## 2019-10-12 NOTE — Telephone Encounter (Signed)
Called patient's home number to schedule the Palliative Consult, no answer - left message with reason for call along with my name and call back number.  I then called listed cell number and after 1 ring the call was dropped.

## 2019-10-17 ENCOUNTER — Ambulatory Visit: Admission: RE | Admit: 2019-10-17 | Payer: Medicare Other | Source: Ambulatory Visit

## 2019-10-20 ENCOUNTER — Ambulatory Visit: Payer: Medicare Other

## 2019-10-20 ENCOUNTER — Other Ambulatory Visit: Payer: Medicare Other

## 2019-10-20 ENCOUNTER — Encounter: Payer: Self-pay | Admitting: Family Medicine

## 2019-10-20 ENCOUNTER — Other Ambulatory Visit: Payer: Self-pay

## 2019-10-20 ENCOUNTER — Ambulatory Visit: Payer: Medicare Other | Admitting: Oncology

## 2019-10-20 ENCOUNTER — Ambulatory Visit (INDEPENDENT_AMBULATORY_CARE_PROVIDER_SITE_OTHER): Payer: Medicare Other | Admitting: Family Medicine

## 2019-10-20 VITALS — BP 120/60 | HR 52 | Temp 98.4°F | Ht 63.0 in | Wt 141.8 lb

## 2019-10-20 DIAGNOSIS — M7062 Trochanteric bursitis, left hip: Secondary | ICD-10-CM | POA: Diagnosis not present

## 2019-10-20 MED FILL — XALKORI 250 MG CAPSULE: 250 | 30 days supply | Qty: 60 | Fill #1

## 2019-10-21 ENCOUNTER — Ambulatory Visit
Admission: RE | Admit: 2019-10-21 | Discharge: 2019-10-21 | Disposition: A | Discharge status: Home / Self Care | Payer: Medicare Other | Source: Ambulatory Visit | Attending: Oncology | Admitting: Oncology

## 2019-10-21 ENCOUNTER — Other Ambulatory Visit: Payer: Self-pay

## 2019-10-21 DIAGNOSIS — C3492 Malignant neoplasm of unspecified part of left bronchus or lung: Secondary | ICD-10-CM | POA: Diagnosis not present

## 2019-10-21 DIAGNOSIS — R9082 White matter disease, unspecified: Secondary | ICD-10-CM | POA: Diagnosis not present

## 2019-10-21 DIAGNOSIS — N179 Acute kidney failure, unspecified: Secondary | ICD-10-CM

## 2019-10-21 DIAGNOSIS — C3490 Malignant neoplasm of unspecified part of unspecified bronchus or lung: Secondary | ICD-10-CM | POA: Diagnosis not present

## 2019-10-21 DIAGNOSIS — C50912 Malignant neoplasm of unspecified site of left female breast: Secondary | ICD-10-CM

## 2019-10-21 MED ORDER — GADOBUTROL 1 MMOL/ML IV SOLN
6.0000 mL | Freq: Once | INTRAVENOUS | Status: AC | PRN
  Administered 2019-10-21: 6 mL via INTRAVENOUS

## 2019-10-21 NOTE — Progress Notes (Signed)
Chief Complaint  Patient presents with  . Leg Pain    Left    History of Present Illness: HPI   79 year old female presents with new onset pain in right upper posterior thigh, radiates to calf. Ongoing x 2 weeks. NO low back pain. No recent fall or injury. No numbness.   Left leg weakness due to pain.  Pain is intermittent. Worse with sitting, does not wake at night.  6-10 on pain scale  She uses a walker and has a history of pelvic fracture.  Currently being treated actively with chemotherapy for lung and breast cancer.  04/2019 PET scan showed no skeletal mets.   This visit occurred during the SARS-CoV-2 public health emergency.  Safety protocols were in place, including screening questions prior to the visit, additional usage of staff PPE, and extensive cleaning of exam room while observing appropriate contact time as indicated for disinfecting solutions.   COVID 19 screen:  No recent travel or known exposure to COVID19 The patient denies respiratory symptoms of COVID 19 at this time. The importance of social distancing was discussed today.     Review of Systems  Constitutional: Negative for chills and fever.  HENT: Negative for congestion and ear pain.   Eyes: Negative for pain and redness.  Respiratory: Negative for cough and shortness of breath.   Cardiovascular: Negative for chest pain, palpitations and leg swelling.  Gastrointestinal: Negative for abdominal pain, blood in stool, constipation, diarrhea, nausea and vomiting.  Genitourinary: Negative for dysuria.  Musculoskeletal: Negative for falls and myalgias.  Skin: Negative for rash.  Neurological: Negative for dizziness.  Psychiatric/Behavioral: Negative for depression. The patient is not nervous/anxious.       Past Medical History:  Diagnosis Date  . AKI (acute kidney injury) ([facility]) 08/17/2018  . Anxiety   . Arthritis   . Belching   . Bladder disorder    OVERACTIVE  . Bowel dysfunction    BLOCKAGE  .  Cancer ([facility])    breast  . Constipation   . Depression   . Diverticulitis   . Fibromyalgia   . GERD (gastroesophageal reflux disease)   . Hyperlipidemia   . IBS (irritable bowel syndrome)   . Internal hemorrhoids   . Lung cancer ([facility]) 2020  . Memory deficits   . Murmur    asymptomatic  . Pneumonia 11/18/12  . Urinary incontinence   . Vertigo     reports that she has never smoked. She has never used smokeless tobacco. She reports current alcohol use. She reports that she does not use drugs.   Current Outpatient Medications:  .  acetaminophen (TYLENOL) 325 MG tablet, Take 325 mg by mouth every 6 (six) hours as needed for mild pain. , Disp: , Rfl:  .  anastrozole (ARIMIDEX) 1 MG tablet, TAKE 1 TABLET BY MOUTH DAILY, Disp: 30 tablet, Rfl: 3 .  atorvastatin (LIPITOR) 20 MG tablet, Take 1 tablet (20 mg total) by mouth at bedtime., Disp: 90 tablet, Rfl: 3 .  benzonatate (TESSALON) 100 MG capsule, Take 1 capsule (100 mg total) by mouth 3 (three) times daily as needed for cough., Disp: 60 capsule, Rfl: 0 .  buPROPion (WELLBUTRIN XL) 150 MG 24 hr tablet, TAKE ONE TABLET BY MOUTH EVERY DAY, Disp: 90 tablet, Rfl: 2 .  Calcium-Magnesium-Vitamin D (CALCIUM 1200+D3 PO), Take 1 tablet by mouth daily., Disp: , Rfl:  .  desonide (DESOWEN) 0.05 % lotion, Apply 1 application topically as needed (after showering). , Disp: , Rfl:  .  Dexlansoprazole (DEXILANT) 30 MG capsule, Take 1 capsule (30 mg total) by mouth daily., Disp: 90 capsule, Rfl: 3 .  Dextromethorphan-guaiFENesin (MUCINEX DM) 30-600 MG TB12, Take 1 tablet by mouth 2 (two) times daily as needed (for congestion/cough)., Disp: , Rfl:  .  fluticasone (FLONASE) 50 MCG/ACT nasal spray, Place 2 sprays into both nostrils daily., Disp: 16 g, Rfl: 6 .  ketoconazole (NIZORAL) 2 % shampoo, Apply 1 application topically. 3 times a week, Disp: , Rfl:  .  levothyroxine (SYNTHROID) 25 MCG tablet, Take 1 tablet (25 mcg total) by mouth daily., Disp: 30 tablet,  Rfl: 1 .  lubiprostone (AMITIZA) 24 MCG capsule, TAKE ONE CAPSULE TWICE A DAY WITH MEALS, Disp: 60 capsule, Rfl: 3 .  memantine (NAMENDA) 10 MG tablet, TAKE ONE TABLET BY MOUTH TWICE DAILY, Disp: 180 tablet, Rfl: 2 .  mirabegron ER (MYRBETRIQ) 50 MG TB24 tablet, Take 1 tablet (50 mg total) by mouth daily., Disp: 90 tablet, Rfl: 1 .  Multiple Vitamin (MULTIVITAMIN WITH MINERALS) TABS tablet, Take 1 tablet by mouth daily., Disp: , Rfl:  .  ondansetron (ZOFRAN) 8 MG tablet, Take 8 mg by mouth every 8 (eight) hours as needed for nausea., Disp: , Rfl:  .  polyethylene glycol (MIRALAX / GLYCOLAX) packet, Take 17 g by mouth daily., Disp: , Rfl:  .  sucralfate (CARAFATE) 1 g tablet, Take 1 tablet (1 g total) by mouth 2 (two) times daily., Disp: 60 tablet, Rfl: 0 .  triamcinolone (NASACORT) 55 MCG/ACT AERO nasal inhaler, Place 2 sprays into the nose daily as needed (for congestion). , Disp: , Rfl:  .  venlafaxine XR (EFFEXOR-XR) 150 MG 24 hr capsule, TAKE 1 CAPSULE BY MOUTH EVERY DAY, Disp: 90 capsule, Rfl: 1 .  XALKORI 250 MG capsule, TAKE 1 CAPSULE (250 MG TOTAL) BY MOUTH 2 TIMES DAILY., Disp: 60 capsule, Rfl: 2   Observations/Objective: Blood pressure 120/60, pulse (!) 52, temperature 98.4 F (36.9 C), temperature source Temporal, height 5\' 3"  (1.6 m), weight 141 lb 12 oz (64.3 kg), SpO2 93 %.  Physical Exam Constitutional:      General: She is not in acute distress.    Appearance: Normal appearance. She is well-developed. She is not ill-appearing or toxic-appearing.  HENT:     Head: Normocephalic.     Right Ear: Hearing, tympanic membrane, ear canal and external ear normal. Tympanic membrane is not erythematous, retracted or bulging.     Left Ear: Hearing, tympanic membrane, ear canal and external ear normal. Tympanic membrane is not erythematous, retracted or bulging.     Nose: No mucosal edema or rhinorrhea.     Right Sinus: No maxillary sinus tenderness or frontal sinus tenderness.     Left  Sinus: No maxillary sinus tenderness or frontal sinus tenderness.     Mouth/Throat:     Pharynx: Uvula midline.  Eyes:     General: Lids are normal. Lids are everted, no foreign bodies appreciated.     Conjunctiva/sclera: Conjunctivae normal.     Pupils: Pupils are equal, round, and reactive to light.  Neck:     Thyroid: No thyroid mass or thyromegaly.     Vascular: No carotid bruit.     Trachea: Trachea normal.  Cardiovascular:     Rate and Rhythm: Normal rate and regular rhythm.     Pulses: Normal pulses.     Heart sounds: Normal heart sounds, S1 normal and S2 normal. No murmur. No friction rub. No gallop.   Pulmonary:  Effort: Pulmonary effort is normal. No tachypnea or respiratory distress.     Breath sounds: Normal breath sounds. No decreased breath sounds, wheezing, rhonchi or rales.  Abdominal:     General: Bowel sounds are normal.     Palpations: Abdomen is soft.     Tenderness: There is no abdominal tenderness.  Musculoskeletal:     Cervical back: Normal range of motion and neck supple.     Thoracic back: Normal.     Lumbar back: Normal. Normal range of motion. Negative right straight leg raise test and negative left straight leg raise test.     Right lower leg: 1+ Pitting Edema present.     Left lower leg: 1+ Pitting Edema present.     Comments: ttp over lateral hiop over trochanteric bursa, no anterior hip pain  ttp in upper thigh  Skin:    General: Skin is warm and dry.     Findings: No rash.  Neurological:     Mental Status: She is alert.  Psychiatric:        Mood and Affect: Mood is not anxious or depressed.        Speech: Speech normal.        Behavior: Behavior normal. Behavior is cooperative.        Thought Content: Thought content normal.        Judgment: Judgment normal.      Assessment and Plan   Trochanteric bursitis Treat with home PT, ICe and low dose meloxicam.  Follow up if not improving as expected.     Eliezer Lofts, MD

## 2019-10-21 NOTE — Assessment & Plan Note (Signed)
Treat with home PT, ICe and low dose meloxicam.  Follow up if not improving as expected.

## 2019-10-25 ENCOUNTER — Telehealth: Payer: Self-pay | Admitting: Oncology

## 2019-10-25 ENCOUNTER — Inpatient Hospital Stay: Payer: Medicare Other

## 2019-10-25 ENCOUNTER — Inpatient Hospital Stay: Payer: Medicare Other | Admitting: Oncology

## 2019-10-25 NOTE — Telephone Encounter (Signed)
Patient phoned on this date and stated that she was not feeling well and wanted to reschedule. Appts rescheduled for 11-02-19.

## 2019-10-31 ENCOUNTER — Other Ambulatory Visit: Payer: Self-pay

## 2019-10-31 ENCOUNTER — Emergency Department
Admission: EM | Admit: 2019-10-31 | Discharge: 2019-10-31 | Disposition: A | Discharge status: Home / Self Care | Payer: Medicare Other | Attending: Emergency Medicine | Admitting: Emergency Medicine

## 2019-10-31 ENCOUNTER — Encounter: Payer: Self-pay | Admitting: Emergency Medicine

## 2019-10-31 ENCOUNTER — Emergency Department: Payer: Medicare Other

## 2019-10-31 DIAGNOSIS — S199XXA Unspecified injury of neck, initial encounter: Secondary | ICD-10-CM | POA: Diagnosis not present

## 2019-10-31 DIAGNOSIS — S0101XA Laceration without foreign body of scalp, initial encounter: Secondary | ICD-10-CM | POA: Insufficient documentation

## 2019-10-31 DIAGNOSIS — W19XXXA Unspecified fall, initial encounter: Secondary | ICD-10-CM

## 2019-10-31 DIAGNOSIS — Y92018 Other place in single-family (private) house as the place of occurrence of the external cause: Secondary | ICD-10-CM | POA: Insufficient documentation

## 2019-10-31 DIAGNOSIS — S0990XA Unspecified injury of head, initial encounter: Secondary | ICD-10-CM | POA: Diagnosis present

## 2019-10-31 DIAGNOSIS — W010XXA Fall on same level from slipping, tripping and stumbling without subsequent striking against object, initial encounter: Secondary | ICD-10-CM | POA: Insufficient documentation

## 2019-10-31 DIAGNOSIS — Z853 Personal history of malignant neoplasm of breast: Secondary | ICD-10-CM | POA: Diagnosis not present

## 2019-10-31 DIAGNOSIS — Z85118 Personal history of other malignant neoplasm of bronchus and lung: Secondary | ICD-10-CM | POA: Insufficient documentation

## 2019-10-31 DIAGNOSIS — G4489 Other headache syndrome: Secondary | ICD-10-CM | POA: Diagnosis not present

## 2019-10-31 DIAGNOSIS — R58 Hemorrhage, not elsewhere classified: Secondary | ICD-10-CM | POA: Diagnosis not present

## 2019-10-31 DIAGNOSIS — R519 Headache, unspecified: Secondary | ICD-10-CM | POA: Diagnosis not present

## 2019-10-31 DIAGNOSIS — Y9301 Activity, walking, marching and hiking: Secondary | ICD-10-CM | POA: Diagnosis not present

## 2019-10-31 DIAGNOSIS — Y998 Other external cause status: Secondary | ICD-10-CM | POA: Insufficient documentation

## 2019-10-31 DIAGNOSIS — Z79899 Other long term (current) drug therapy: Secondary | ICD-10-CM | POA: Diagnosis not present

## 2019-10-31 DIAGNOSIS — R0902 Hypoxemia: Secondary | ICD-10-CM | POA: Diagnosis not present

## 2019-10-31 DIAGNOSIS — Z9221 Personal history of antineoplastic chemotherapy: Secondary | ICD-10-CM | POA: Insufficient documentation

## 2019-10-31 DIAGNOSIS — R52 Pain, unspecified: Secondary | ICD-10-CM | POA: Diagnosis not present

## 2019-10-31 MED ORDER — LIDOCAINE-EPINEPHRINE 2 %-1:100000 IJ SOLN
20.0000 mL | Freq: Once | INTRAMUSCULAR | Status: AC
  Administered 2019-10-31: 20 mL via INTRADERMAL
  Filled 2019-10-31: qty 1

## 2019-10-31 NOTE — ED Triage Notes (Signed)
First Nurse Note:  Lives in independent housing at [facility].  Mechanical fall, lifted up walker and fell backward, hitting head.  No LOC.  Hematoma to area.  NAD

## 2019-10-31 NOTE — ED Triage Notes (Signed)
See first RN Note, pt A&O x4, NAD noted at this time, pt denies LOC, reports some tenderness to laceration on posterior head.

## 2019-10-31 NOTE — ED Notes (Signed)
AAOx3.  To CT scan via WC

## 2019-10-31 NOTE — Discharge Instructions (Addendum)
It is OK to shower  Keep the area covered in antibiotic ointment   No swimming  Monitor for redness, drainage, or signs of infection

## 2019-10-31 NOTE — ED Notes (Signed)
Lac cart and [place] to bedside for MD  PT alert and speaking on the phone

## 2019-10-31 NOTE — ED Provider Notes (Signed)
[facility] Emergency Department Provider Note  ____________________________________________   First MD Initiated Contact with Patient 10/31/19 2058     (approximate)  I have reviewed the triage vital signs and the nursing notes.   HISTORY  Chief Complaint Fall    HPI Jamie Matthews is a 79 y.o. female  Here with fall. Pt was walking up a brick step at home today when she slipped, falling backward. She had put her walker up the step then tried to step up. She fell backwards, struck the back of her head. Immediate onset of aching, throbbing head pain with swelling. No neck pain or neck stiffness. No UE weakness, numbness, or tingling. No other injuries. She was well prior to and since the event. No LE symptoms. Pain is worse w/ palpation. Tetanus is UTD.         Past Medical History:  Diagnosis Date  . AKI (acute kidney injury) ([place]) 08/17/2018  . Anxiety   . Arthritis   . Belching   . Bladder disorder    OVERACTIVE  . Bowel dysfunction    BLOCKAGE  . Cancer (HCC)    breast  . Constipation   . Depression   . Diverticulitis   . Fibromyalgia   . GERD (gastroesophageal reflux disease)   . Hyperlipidemia   . IBS (irritable bowel syndrome)   . Internal hemorrhoids   . Lung cancer ([place]) 2020  . Memory deficits   . Murmur    asymptomatic  . Pneumonia 11/18/12  . Urinary incontinence   . Vertigo     Patient Active Problem List   Diagnosis Date Noted  . Trochanteric bursitis 09/20/2019  . Pruritus 09/06/2019  . Overactive bladder 09/06/2019  . Chronic pain of right knee 09/06/2019  . Multiple rib fractures 07/15/2019  . Fall as cause of accidental injury at home as place of occurrence 07/15/2019  . Encounter for antineoplastic chemotherapy 12/17/2018  . Lower leg edema 12/17/2018  . Aromatase inhibitor use 10/24/2018  . Osteopenia 10/24/2018  . Fever 10/10/2018  . Closed displaced fracture of pubis ([place]) 09/20/2018  .  Localized swelling of right lower leg 09/20/2018  . AKI (acute kidney injury) ([place]) 08/17/2018  . Myofascial pain 07/28/2018  . Primary adenocarcinoma of lung ([place]) 07/21/2018  . Malignant neoplasm of left female breast ([place]) 07/21/2018  . Adenopathy   . Goals of care, counseling/discussion 06/26/2018  . IBS (irritable bowel syndrome) 05/25/2018  . BPV (benign positional vertigo) 07/30/2017  . Unilateral primary osteoarthritis, left knee 06/24/2017  . Bunion of great toe of right foot 06/09/2017  . Dizziness 03/24/2017  . Decreased appetite 12/03/2016  . Fatigue 09/22/2016  . Insomnia 09/22/2016  . Depression, recurrent ([place]) 09/22/2016  . Chronic cough 08/21/2016  . Primary localized osteoarthrosis, hand 03/19/2016  . OA (osteoarthritis) of knee 03/17/2016  . Stress due to illness of family member 10/10/2014  . Ileitis 06/22/2014  . Chronic insomnia 09/20/2013  . DNR (do not resuscitate) 11/15/2012  . Chronic constipation 04/01/2012  . 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 Urinary incontinence   . Major depressive disorder, recurrent, moderate   . Gastroesophageal reflux disease   . Hyperlipidemia     Past Surgical History:  Procedure Laterality Date  . AXILLARY LYMPH NODE BIOPSY Left 06/08/2018   INVASIVE MAMMARY CARCINOMA  . BLADDER SUSPENSION  2004, 2012  . BREAST BIOPSY Left 06/08/2018   INVASIVE MAMMARY CARCINOMA  . CATARACT EXTRACTION W/PHACO Right 08/27/2015   Procedure: CATARACT EXTRACTION  PHACO AND INTRAOCULAR LENS PLACEMENT (IOC);  Surgeon: Estill Cotta, MD;  Location: [facility] ORS;  Service: Ophthalmology;  Laterality: Right;  [place]   1:00.2 AP%  22.5 CDE  23.67 fluid casette lot #[id number]  exp05/31/2018  . CATARACT EXTRACTION W/PHACO Left 10/15/2015   Procedure: CATARACT EXTRACTION PHACO AND INTRAOCULAR LENS PLACEMENT (IOC);  Surgeon: Estill Cotta, MD;  Location: [facility] ORS;  Service: Ophthalmology;  Laterality: Left;  [place] 01:07 AP% 18.1 CDE 21.57 fluid pack lot #  [id number]  . CHOLECYSTECTOMY    . COLONOSCOPY  2017  . ELECTROMAGNETIC NAVIGATION BROCHOSCOPY N/A 07/09/2018   Procedure: ELECTROMAGNETIC NAVIGATION BRONCHOSCOPY;  Surgeon: Flora Lipps, MD;  Location: [facility] ORS;  Service: Cardiopulmonary;  Laterality: N/A;  . TONSILLECTOMY  1947    Prior to Admission medications   Medication Sig Start Date End Date Taking? Authorizing Provider  acetaminophen (TYLENOL) 325 MG tablet Take 325 mg by mouth every 6 (six) hours as needed for mild pain.     [provider]  anastrozole (ARIMIDEX) 1 MG tablet TAKE 1 TABLET BY MOUTH DAILY 07/26/19   Earlie Server, MD  atorvastatin (LIPITOR) 20 MG tablet Take 1 tablet (20 mg total) by mouth at bedtime. 12/01/18   [name], MD  benzonatate (TESSALON) 100 MG capsule Take 1 capsule (100 mg total) by mouth 3 (three) times daily as needed for cough. 04/19/19   [name], MD  buPROPion (WELLBUTRIN XL) 150 MG 24 hr tablet TAKE ONE TABLET BY MOUTH EVERY DAY 09/29/18   Lucille Passy, MD  Calcium-Magnesium-Vitamin D (CALCIUM 1200+D3 PO) Take 1 tablet by mouth daily.    [provider]  desonide (DESOWEN) 0.05 % lotion Apply 1 application topically as needed (after showering).     [provider]  Dexlansoprazole (DEXILANT) 30 MG capsule Take 1 capsule (30 mg total) by mouth daily. 05/05/19   Borders, Kirt Boys, NP  Dextromethorphan-guaiFENesin (MUCINEX DM) 30-600 MG TB12 Take 1 tablet by mouth 2 (two) times daily as needed (for congestion/cough).    [provider]  fluticasone (FLONASE) 50 MCG/ACT nasal spray Place 2 sprays into both nostrils daily. 11/25/18   [name], MD  ketoconazole (NIZORAL) 2 % shampoo Apply 1 application topically. 3 times a week 04/20/18   [provider]  levothyroxine (SYNTHROID) 25 MCG tablet Take 1 tablet (25 mcg total) by mouth daily. 09/09/19   [name], MD  lubiprostone (AMITIZA) 24 MCG capsule TAKE ONE CAPSULE TWICE A DAY WITH MEALS 02/09/18    Pyrtle, Lajuan Lines, MD  memantine (NAMENDA) 10 MG tablet TAKE ONE TABLET BY MOUTH TWICE DAILY 09/29/18   Lucille Passy, MD  mirabegron ER (MYRBETRIQ) 50 MG TB24 tablet Take 1 tablet (50 mg total) by mouth daily. 09/13/19   [name], MD  Multiple Vitamin (MULTIVITAMIN WITH MINERALS) TABS tablet Take 1 tablet by mouth daily.    [provider]  ondansetron (ZOFRAN) 8 MG tablet Take 8 mg by mouth every 8 (eight) hours as needed for nausea.    [provider]  polyethylene glycol (MIRALAX / GLYCOLAX) packet Take 17 g by mouth daily.    [provider]  sucralfate (CARAFATE) 1 g tablet Take 1 tablet (1 g total) by mouth 2 (two) times daily. 03/15/19   Earlie Server, MD  triamcinolone (NASACORT) 55 MCG/ACT AERO nasal inhaler Place 2 sprays into the nose daily as needed (for congestion).     [provider]  venlafaxine XR (EFFEXOR-XR) 150  MG 24 hr capsule TAKE 1 CAPSULE BY MOUTH EVERY DAY 06/28/19   Lucille Passy, MD  Hulda Humphrey 250 MG capsule TAKE 1 CAPSULE (250 MG TOTAL) BY MOUTH 2 TIMES DAILY. 09/22/19   Earlie Server, MD    Allergies Sulfa antibiotics  Family History  Problem Relation Age of Onset  . Diabetes Father   . Heart disease Father   . Lymphoma Father   . Heart disease Mother   . Breast cancer Sister 54  . Colon cancer Neg Hx   . Esophageal cancer Neg Hx   . Rectal cancer Neg Hx   . Stomach cancer Neg Hx   . Bladder Cancer Neg Hx   . Kidney cancer Neg Hx     Social History Social History   Tobacco Use  . Smoking status: Never Smoker  . Smokeless tobacco: Never Used  Substance Use Topics  . Alcohol use: Yes    Comment: rare wine  . Drug use: No    Review of Systems  Review of Systems  Constitutional: Negative for chills and fever.  HENT: Negative for sore throat.   Respiratory: Negative for shortness of breath.   Cardiovascular: Negative for chest pain.  Gastrointestinal: Negative for abdominal pain.  Genitourinary: Negative for flank pain.    Musculoskeletal: Negative for neck pain.  Skin: Positive for wound. Negative for rash.  Allergic/Immunologic: Negative for immunocompromised state.  Neurological: Negative for weakness and numbness.  Hematological: Does not bruise/bleed easily.     ____________________________________________  PHYSICAL EXAM:      VITAL SIGNS: ED Triage Vitals [10/31/19 1816]  Enc Vitals Group     BP (!) 121/48     Pulse Rate (!) 53     Resp 20     Temp 98.5 F (36.9 C)     Temp Source Oral     SpO2 95 %     Weight 141 lb (64 kg)     Height 5\' 3"  (1.6 m)     Head Circumference      Peak Flow      Pain Score 4     Pain Loc      Pain Edu?      Excl. in [place]?      Physical Exam Vitals and nursing note reviewed.  Constitutional:      General: She is not in acute distress.    Appearance: She is well-developed.  HENT:     Head: Normocephalic and atraumatic.     Comments: Irregular 2 cm laceration to posterior scalp, with surrounding boggy hematoma without palpable crepitance or step offs. No periorbital or postauricular ecchymoses.  Eyes:     Conjunctiva/sclera: Conjunctivae normal.  Neck:     Comments: No midline or paraspinal TTP Cardiovascular:     Rate and Rhythm: Normal rate and regular rhythm.     Heart sounds: Normal heart sounds.  Pulmonary:     Effort: Pulmonary effort is normal. No respiratory distress.     Breath sounds: No wheezing.  Abdominal:     General: There is no distension.  Musculoskeletal:     Cervical back: Neck supple.  Skin:    General: Skin is warm.     Capillary Refill: Capillary refill takes less than 2 seconds.     Findings: No rash.  Neurological:     Mental Status: She is alert and oriented to person, place, and time.     Motor: No abnormal muscle tone.       ____________________________________________  LABS (all labs ordered are listed, but only abnormal results are displayed)  Labs Reviewed - No data to  display  ____________________________________________  EKG: None ________________________________________  RADIOLOGY All imaging, including plain films, CT scans, and ultrasounds, independently reviewed by me, and interpretations confirmed via formal radiology reads.  ED MD interpretation:   CT Head: NAICA CT Spine Cervical: No acute fracture, DDD, lung mass noted  Official radiology report(s): CT Head Wo Contrast  Result Date: 10/31/2019 CLINICAL DATA:  Status post fall. EXAM: CT HEAD WITHOUT CONTRAST TECHNIQUE: Contiguous axial images were obtained from the base of the skull through the vertex without intravenous contrast. COMPARISON:  None. FINDINGS: Brain: There is mild cerebral atrophy with widening of the extra-axial spaces and ventricular dilatation. There are areas of decreased attenuation within the white matter tracts of the supratentorial brain, consistent with microvascular disease changes. Vascular: No hyperdense vessel or unexpected calcification. Skull: Normal. Negative for fracture or focal lesion. Sinuses/Orbits: No acute finding. Other: Moderate severity scalp soft tissue swelling, with an associated scalp hematoma, is seen along the posterior aspect of the vertex on the left. A mild amount of scalp soft tissue air is also noted. IMPRESSION: 1. Moderate severity scalp soft tissue swelling, with an associated scalp hematoma, seen along the posterior aspect of the vertex on the left. 2. No acute intracranial abnormality. Electronically Signed   By: Virgina Norfolk M.D.   On: 10/31/2019 19:25   CT Cervical Spine Wo Contrast  Result Date: 10/31/2019 CLINICAL DATA:  Status post fall. EXAM: CT CERVICAL SPINE WITHOUT CONTRAST TECHNIQUE: Multidetector CT imaging of the cervical spine was performed without intravenous contrast. Multiplanar CT image reconstructions were also generated. COMPARISON:  None. FINDINGS: Alignment: There is approximately 2 mm anterolisthesis of the C3 on C4  vertebral body. Skull base and vertebrae: No acute fracture. No primary bone lesion or focal pathologic process. Soft tissues and spinal canal: No prevertebral fluid or swelling. No visible canal hematoma. Disc levels: Moderate severity endplate sclerosis is seen at the levels of C4-C5, C5-C6, C6-C7 and C7-T1. Moderate to marked severity intervertebral disc space narrowing is also seen at these levels. Moderate severity bilateral multilevel facet joint hypertrophy is seen. Upper chest: A 2.4 cm x 1.7 cm x 1.9 cm low-attenuation lung mass is seen within the anteromedial aspect of the left apex. A chronic posterior third right rib fracture is seen. Other: None. IMPRESSION: 1. No acute fracture within the cervical spine. 2. Moderate severity multilevel degenerative disc disease and facet joint hypertrophy. 3. 2.4 cm x 1.7 cm x 1.9 cm low-attenuation lung mass is seen within the anteromedial aspect of the left apex. This finding is concerning for the presence of a primary lung malignancy. Further evaluation with a nonemergent chest CT is recommended. Electronically Signed   By: Virgina Norfolk M.D.   On: 10/31/2019 19:29    ____________________________________________  PROCEDURES   Procedure(s) performed (including Critical Care):  [name] KitchenMarland KitchenLaceration Repair  Date/Time: 10/31/2019 11:41 PM Performed by: Duffy Bruce, MD Authorized by: Duffy Bruce, MD   Consent:    Consent obtained:  Verbal   Consent given by:  Patient   Risks discussed:  Infection, need for additional repair, pain, tendon damage, retained foreign body, vascular damage, poor cosmetic result, poor wound healing and nerve damage   Alternatives discussed:  Referral and delayed treatment Anesthesia (see MAR for exact dosages):    Anesthesia method:  Local infiltration   Local anesthetic:  Lidocaine 2% WITH epi Laceration details:  Location:  Scalp   Scalp location:  L parietal   Length (cm):  2 Repair type:    Repair type:   Simple Pre-procedure details:    Preparation:  Patient was prepped and draped in usual sterile fashion and imaging obtained to evaluate for foreign bodies Exploration:    Hemostasis achieved with:  Direct pressure   Wound exploration: wound explored through full range of motion and entire depth of wound probed and visualized   Treatment:    Area cleansed with:  Betadine   Amount of cleaning:  Extensive   Irrigation solution:  Sterile water   Irrigation method:  Pressure wash Skin repair:    Repair method:  Staples   Number of staples:  3 Approximation:    Approximation:  Close Post-procedure details:    Dressing:  Antibiotic ointment   Patient tolerance of procedure:  Tolerated well, no immediate complications    ____________________________________________  INITIAL IMPRESSION / MDM / ASSESSMENT AND PLAN / ED COURSE  As part of my medical decision making, I reviewed the following data within the [facility] notes reviewed and incorporated, Old chart reviewed, Notes from prior ED visits, and [place] Controlled Substance Database       *Addison Freimuth was evaluated in Emergency Department on 10/31/2019 for the symptoms described in the history of present illness. She was evaluated in the context of the global COVID-19 pandemic, which necessitated consideration that the patient might be at risk for infection with the SARS-CoV-2 virus that causes COVID-19. Institutional protocols and algorithms that pertain to the evaluation of patients at risk for COVID-19 are in a state of rapid change based on information released by regulatory bodies including the CDC and federal and state organizations. These policies and algorithms were followed during the patient's care in the ED.  Some ED evaluations and interventions may be delayed as a result of limited staffing during the pandemic.*     Medical Decision Making:  79 yo F here with superficial laceration and  hematoma to posterior scalp after mechanical fall. No neck pain or signs of central cord syndrome. Incidental mass noted on CT but this is known per review of her oncology notes. No blood thinner use. She is alert, oriented, Tetanus UTD. Lac repaired, will d/c with outpt follow-up. Encouraged close monitoring for infection given the underlying hematoma.  ____________________________________________  FINAL CLINICAL IMPRESSION(S) / ED DIAGNOSES  Final diagnoses:  Laceration of scalp, initial encounter  Fall, initial encounter     MEDICATIONS GIVEN DURING THIS VISIT:  Medications  lidocaine-EPINEPHrine (XYLOCAINE W/EPI) 2 %-1:100000 (with pres) injection 20 mL (20 mLs Intradermal Given by Other 10/31/19 2139)     ED Discharge Orders    None       Note:  This document was prepared using Dragon voice recognition software and may include unintentional dictation errors.   Duffy Bruce, MD 10/31/19 [phone]

## 2019-10-31 NOTE — ED Notes (Signed)
Dressed pt's wound

## 2019-11-01 ENCOUNTER — Ambulatory Visit: Payer: Medicare Other | Admitting: Family Medicine

## 2019-11-02 ENCOUNTER — Encounter: Payer: Self-pay | Admitting: Oncology

## 2019-11-02 ENCOUNTER — Inpatient Hospital Stay (HOSPITAL_BASED_OUTPATIENT_CLINIC_OR_DEPARTMENT_OTHER): Payer: Medicare Other | Admitting: Oncology

## 2019-11-02 ENCOUNTER — Inpatient Hospital Stay: Payer: Medicare Other | Attending: Oncology

## 2019-11-02 ENCOUNTER — Other Ambulatory Visit: Payer: Self-pay

## 2019-11-02 ENCOUNTER — Inpatient Hospital Stay: Payer: Medicare Other

## 2019-11-02 VITALS — BP 146/55 | HR 52 | Temp 96.7°F | Wt 148.3 lb

## 2019-11-02 DIAGNOSIS — M47816 Spondylosis without myelopathy or radiculopathy, lumbar region: Secondary | ICD-10-CM | POA: Diagnosis not present

## 2019-11-02 DIAGNOSIS — R5383 Other fatigue: Secondary | ICD-10-CM | POA: Insufficient documentation

## 2019-11-02 DIAGNOSIS — E785 Hyperlipidemia, unspecified: Secondary | ICD-10-CM | POA: Diagnosis not present

## 2019-11-02 DIAGNOSIS — M7989 Other specified soft tissue disorders: Secondary | ICD-10-CM | POA: Diagnosis not present

## 2019-11-02 DIAGNOSIS — Z5111 Encounter for antineoplastic chemotherapy: Secondary | ICD-10-CM

## 2019-11-02 DIAGNOSIS — C3412 Malignant neoplasm of upper lobe, left bronchus or lung: Secondary | ICD-10-CM | POA: Insufficient documentation

## 2019-11-02 DIAGNOSIS — Z833 Family history of diabetes mellitus: Secondary | ICD-10-CM | POA: Insufficient documentation

## 2019-11-02 DIAGNOSIS — Z8719 Personal history of other diseases of the digestive system: Secondary | ICD-10-CM | POA: Insufficient documentation

## 2019-11-02 DIAGNOSIS — Z803 Family history of malignant neoplasm of breast: Secondary | ICD-10-CM | POA: Diagnosis not present

## 2019-11-02 DIAGNOSIS — K219 Gastro-esophageal reflux disease without esophagitis: Secondary | ICD-10-CM | POA: Diagnosis not present

## 2019-11-02 DIAGNOSIS — K5900 Constipation, unspecified: Secondary | ICD-10-CM | POA: Diagnosis not present

## 2019-11-02 DIAGNOSIS — R600 Localized edema: Secondary | ICD-10-CM | POA: Insufficient documentation

## 2019-11-02 DIAGNOSIS — N179 Acute kidney failure, unspecified: Secondary | ICD-10-CM

## 2019-11-02 DIAGNOSIS — I700 Atherosclerosis of aorta: Secondary | ICD-10-CM | POA: Diagnosis not present

## 2019-11-02 DIAGNOSIS — Z9049 Acquired absence of other specified parts of digestive tract: Secondary | ICD-10-CM | POA: Insufficient documentation

## 2019-11-02 DIAGNOSIS — Z79899 Other long term (current) drug therapy: Secondary | ICD-10-CM | POA: Insufficient documentation

## 2019-11-02 DIAGNOSIS — S0003XA Contusion of scalp, initial encounter: Secondary | ICD-10-CM | POA: Diagnosis not present

## 2019-11-02 DIAGNOSIS — S2241XA Multiple fractures of ribs, right side, initial encounter for closed fracture: Secondary | ICD-10-CM | POA: Insufficient documentation

## 2019-11-02 DIAGNOSIS — Z882 Allergy status to sulfonamides status: Secondary | ICD-10-CM | POA: Diagnosis not present

## 2019-11-02 DIAGNOSIS — C50012 Malignant neoplasm of nipple and areola, left female breast: Secondary | ICD-10-CM | POA: Insufficient documentation

## 2019-11-02 DIAGNOSIS — Z8249 Family history of ischemic heart disease and other diseases of the circulatory system: Secondary | ICD-10-CM | POA: Diagnosis not present

## 2019-11-02 DIAGNOSIS — M8580 Other specified disorders of bone density and structure, unspecified site: Secondary | ICD-10-CM | POA: Insufficient documentation

## 2019-11-02 DIAGNOSIS — Z170 Estrogen receptor positive status [ER+]: Secondary | ICD-10-CM | POA: Diagnosis not present

## 2019-11-02 DIAGNOSIS — C3492 Malignant neoplasm of unspecified part of left bronchus or lung: Secondary | ICD-10-CM

## 2019-11-02 DIAGNOSIS — R110 Nausea: Secondary | ICD-10-CM | POA: Diagnosis not present

## 2019-11-02 DIAGNOSIS — C50912 Malignant neoplasm of unspecified site of left female breast: Secondary | ICD-10-CM

## 2019-11-02 DIAGNOSIS — Z79811 Long term (current) use of aromatase inhibitors: Secondary | ICD-10-CM | POA: Diagnosis not present

## 2019-11-02 LAB — COMPREHENSIVE METABOLIC PANEL WITH GFR
ALT: 31 U/L (ref 0–44)
AST: 30 U/L (ref 15–41)
Albumin: 3.1 g/dL — ABNORMAL LOW (ref 3.5–5.0)
Alkaline Phosphatase: 86 U/L (ref 38–126)
Anion gap: 8 (ref 5–15)
BUN: 32 mg/dL — ABNORMAL HIGH (ref 8–23)
CO2: 25 mmol/L (ref 22–32)
Calcium: 7.2 mg/dL — ABNORMAL LOW (ref 8.9–10.3)
Chloride: 108 mmol/L (ref 98–111)
Creatinine, Ser: 1.19 mg/dL — ABNORMAL HIGH (ref 0.44–1.00)
GFR calc Af Amer: 50 mL/min — ABNORMAL LOW
GFR calc non Af Amer: 43 mL/min — ABNORMAL LOW
Glucose, Bld: 115 mg/dL — ABNORMAL HIGH (ref 70–99)
Potassium: 4.3 mmol/L (ref 3.5–5.1)
Sodium: 141 mmol/L (ref 135–145)
Total Bilirubin: 0.6 mg/dL (ref 0.3–1.2)
Total Protein: 5.7 g/dL — ABNORMAL LOW (ref 6.5–8.1)

## 2019-11-02 LAB — CBC WITH DIFFERENTIAL/PLATELET
Abs Immature Granulocytes: 0.08 10*3/uL — ABNORMAL HIGH (ref 0.00–0.07)
Basophils Absolute: 0 10*3/uL (ref 0.0–0.1)
Basophils Relative: 1 %
Eosinophils Absolute: 0.4 10*3/uL (ref 0.0–0.5)
Eosinophils Relative: 5 %
HCT: 30.8 % — ABNORMAL LOW (ref 36.0–46.0)
Hemoglobin: 10 g/dL — ABNORMAL LOW (ref 12.0–15.0)
Immature Granulocytes: 1 %
Lymphocytes Relative: 23 %
Lymphs Abs: 1.6 10*3/uL (ref 0.7–4.0)
MCH: 32.2 pg (ref 26.0–34.0)
MCHC: 32.5 g/dL (ref 30.0–36.0)
MCV: 99 fL (ref 80.0–100.0)
Monocytes Absolute: 1.2 10*3/uL — ABNORMAL HIGH (ref 0.1–1.0)
Monocytes Relative: 17 %
Neutro Abs: 3.7 10*3/uL (ref 1.7–7.7)
Neutrophils Relative %: 53 %
Platelets: 268 10*3/uL (ref 150–400)
RBC: 3.11 MIL/uL — ABNORMAL LOW (ref 3.87–5.11)
RDW: 14.5 % (ref 11.5–15.5)
WBC: 6.9 10*3/uL (ref 4.0–10.5)
nRBC: 0 % (ref 0.0–0.2)

## 2019-11-02 NOTE — Progress Notes (Signed)
Patient went to ER on 10/31/19 due to fall that required her to get staples in head.  Also reports a vaginal discharge for the past 2 weeks.

## 2019-11-02 NOTE — Progress Notes (Signed)
Hematology/Oncology follow up note [facility] [phone] Fax:([phone]   Patient Care Team: [name], MD as PCP - General (Family Medicine) Osborne Oman, MD as Consulting Physician (Obstetrics and Gynecology) Pyrtle, Lajuan Lines, MD as Consulting Physician (Gastroenterology) Telford Nab, RN as Registered Nurse Earlie Server, MD as Consulting Physician (Hematology and Oncology) Debbora Dus, [facility] as Pharmacist (Pharmacist)   REASON FOR VISIT:  Follow up for management of lung cancer and breast cancer  HISTORY OF PRESENTING ILLNESS:  Jamie Matthews is a  79 y.o.  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 05/05/2018 bilateral diagnostic breast mammogram showed suspicious mass 1.1cm  in the 12:00 retroareolar region of the left breast and the left axillary lymph node. 06/08/2018 patient status post a left breast retroareolar and left axillary lymph node biopsy. Pathology showed invasive mammary carcinoma, no special type, grade 1, left axillary lymph node positive for invasive mammary carcinoma clinically metastatic.  Background lymph node architecture is not identified. ER> 90% PR> 90%, HER-2 negative.  PET scan done which unfortunately showed additional hypermetabolic bilateral hilar lymph nodes as well as left upper lobe lung mass which may represent a focus of metastatic disease from breast, or primary lung neoplasm.  There is multiple additional small pulmonary nodules are scattered throughout both lungs which are worrisome for metastasis.  Index nodule within the medial right upper lobe measures 7 mm,, peri-broncho-vascular nodule in the right lower lobe measures 1.2 cm.  # Stage IV lung cancer-  Biopsy of lung mass left upper lobe showed non-small cell lung cancer, favor adenocarcinoma.  #NGS came back patient has PD L1 is 70% TPS, MET fusion mutation.  #Mid-March 2020 started on  crizotinib  Bilateral lower extremity edema, right >left, Ultrasound venous right 09/20/2018 no DVT.  2D echo 10/10/2018 showed LVEF 55 to 60% Most likely secondary to crizotinib side effects.  # 12/15/2018 CT chest images were independently reviewed and discussed with patient. Dominant left upper lobe nodule and multiple scattered irregular pulmonary parenchymal nodular lesions are stable. Mild basilar pulmonary parenchymal septal thickening is new and can be seen with pulmonary edema. Patient reports no shortness of breath asymptomatic  #12/29/2018 unilateral left diagnostic mammogram with ultrasound images were independently reviewed and discussed with patient. Slight interval reduction of the size of the left retroareolar breast mass, 9 x 6 x 7 mm, comparing to 10 x 9 x 8 mm. Axillary lymph node measured 1.3 x 1.0 x 1.0 cm, previously 2.2 x 1.4 x 1.5 cm,  there were 2 other abdominal lymph nodes in the left axilla, which were not reported in previous ultrasound. Discussed with patient that overall, breast cancer responded to endocrine treatments.  Additional 2 lymph nodes need to be closely monitored.  Recommend patient to continue Arimidex.   INTERVAL HISTORY [name] is a 79 y.o. female who has above history reviewed by me today presents for acute visit for nausea, and constipation.  Patient feels pretty well today. Status post another fall and she went to emergency room and had CT head and cervical spine done. Today he denies any pain, shortness of breath.  Chronic lower extremity swelling for which she uses compression stocking. Nausea has resolved..   Review of Systems  Constitutional: Positive for fatigue. Negative for appetite change, chills and fever.  HENT:   Negative for hearing loss and voice change.   Eyes: Negative for eye problems.  Respiratory: Negative  for chest tightness and cough.   Cardiovascular: Positive for leg swelling. Negative for chest pain.   Gastrointestinal: Positive for constipation. Negative for abdominal distention, abdominal pain, blood in stool and nausea.  Endocrine: Negative for hot flashes.  Genitourinary: Negative for difficulty urinating and frequency.   Musculoskeletal: Negative for arthralgias.  Skin: Negative for itching and rash.  Neurological: Negative for extremity weakness.  Hematological: Negative for adenopathy.  Psychiatric/Behavioral: Negative for confusion and sleep disturbance.    MEDICAL HISTORY:  Past Medical History:  Diagnosis Date  . AKI (acute kidney injury) ([place]) 08/17/2018  . Anxiety   . Arthritis   . Belching   . Bladder disorder    OVERACTIVE  . Bowel dysfunction    BLOCKAGE  . Cancer (HCC)    breast  . Constipation   . Depression   . Diverticulitis   . Fibromyalgia   . GERD (gastroesophageal reflux disease)   . Hyperlipidemia   . IBS (irritable bowel syndrome)   . Internal hemorrhoids   . Lung cancer ([place]) 2020  . Memory deficits   . Murmur    asymptomatic  . Pneumonia 11/18/12  . Urinary incontinence   . Vertigo     SURGICAL HISTORY: Past Surgical History:  Procedure Laterality Date  . AXILLARY LYMPH NODE BIOPSY Left 06/08/2018   INVASIVE MAMMARY CARCINOMA  . BLADDER SUSPENSION  2004, 2012  . BREAST BIOPSY Left 06/08/2018   INVASIVE MAMMARY CARCINOMA  . CATARACT EXTRACTION W/PHACO Right 08/27/2015   Procedure: CATARACT EXTRACTION PHACO AND INTRAOCULAR LENS PLACEMENT (IOC);  Surgeon: Estill Cotta, MD;  Location: [facility] ORS;  Service: Ophthalmology;  Laterality: Right;  [place]   1:00.2 AP%  22.5 CDE  23.67 fluid casette lot #[id number]  exp05/31/2018  . CATARACT EXTRACTION W/PHACO Left 10/15/2015   Procedure: CATARACT EXTRACTION PHACO AND INTRAOCULAR LENS PLACEMENT (IOC);  Surgeon: Estill Cotta, MD;  Location: [facility] ORS;  Service: Ophthalmology;  Laterality: Left;  [place] 01:07 AP% 18.1 CDE 21.57 fluid pack lot # [id number]  . CHOLECYSTECTOMY    . COLONOSCOPY  2017  .  ELECTROMAGNETIC NAVIGATION BROCHOSCOPY N/A 07/09/2018   Procedure: ELECTROMAGNETIC NAVIGATION BRONCHOSCOPY;  Surgeon: Flora Lipps, MD;  Location: [facility] ORS;  Service: Cardiopulmonary;  Laterality: N/A;  . TONSILLECTOMY  1947    SOCIAL HISTORY: Social History   Socioeconomic History  . Marital status: Married    Spouse name: Bennye Alm  . Number of children: 0  . Years of education: Master's Degree  . Highest education level: Not on file  Occupational History  . Occupation: Retired  Tobacco Use  . Smoking status: Never Smoker  . Smokeless tobacco: Never Used  Substance and Sexual Activity  . Alcohol use: Yes    Comment: rare wine  . Drug use: No  . Sexual activity: Not Currently  Other Topics Concern  . Not on file  Social History Narrative   Recently moved with her husband to [place] from [place].   Husband is a retired Pharmacist, community.   No children.   She is a retired Pharmacist, hospital.   Enjoys: Chief Technology Officer, reading - mysteries and biographies, cooking   Exercise: walking, gardening   Diet: low appetite due to cancer treatment, grazing   She is a DNR.   Social Determinants of Health   Financial Resource Strain:   . Difficulty of Paying Living Expenses:   Food Insecurity:   . Worried About Charity fundraiser in the Last Year:   . [place] in the Last  Year:   Transportation Needs:   . Film/video editor (Medical):   [name] Lack of Transportation (Non-Medical):   Physical Activity:   . Days of Exercise per Week:   . Minutes of Exercise per Session:   Stress:   . Feeling of Stress :   Social Connections:   . Frequency of Communication with Friends and Family:   . Frequency of Social Gatherings with Friends and Family:   . Attends Religious Services:   . Active Member of Clubs or Organizations:   . Attends Archivist Meetings:   [name] Marital Status:   Intimate Partner Violence:   . Fear of Current or Ex-Partner:   . Emotionally Abused:   [name] Physically Abused:     . Sexually Abused:     FAMILY HISTORY: Family History  Problem Relation Age of Onset  . Diabetes Father   . Heart disease Father   . Lymphoma Father   . Heart disease Mother   . Breast cancer Sister 48  . Colon cancer Neg Hx   . Esophageal cancer Neg Hx   . Rectal cancer Neg Hx   . Stomach cancer Neg Hx   . Bladder Cancer Neg Hx   . Kidney cancer Neg Hx     ALLERGIES:  is allergic to sulfa antibiotics.  MEDICATIONS:  Current Outpatient Medications  Medication Sig Dispense Refill  . acetaminophen (TYLENOL) 325 MG tablet Take 325 mg by mouth every 6 (six) hours as needed for mild pain.     [name] anastrozole (ARIMIDEX) 1 MG tablet TAKE 1 TABLET BY MOUTH DAILY 30 tablet 3  . atorvastatin (LIPITOR) 20 MG tablet Take 1 tablet (20 mg total) by mouth at bedtime. 90 tablet 3  . buPROPion (WELLBUTRIN XL) 150 MG 24 hr tablet TAKE ONE TABLET BY MOUTH EVERY DAY 90 tablet 2  . Calcium-Magnesium-Vitamin D (CALCIUM 1200+D3 PO) Take 1 tablet by mouth daily.    [name] desonide (DESOWEN) 0.05 % lotion Apply 1 application topically as needed (after showering).     . Dexlansoprazole (DEXILANT) 30 MG capsule Take 1 capsule (30 mg total) by mouth daily. 90 capsule 3  . fluticasone (FLONASE) 50 MCG/ACT nasal spray Place 2 sprays into both nostrils daily. 16 g 6  . ketoconazole (NIZORAL) 2 % shampoo Apply 1 application topically. 3 times a week    . levothyroxine (SYNTHROID) 25 MCG tablet Take 1 tablet (25 mcg total) by mouth daily. 30 tablet 1  . lubiprostone (AMITIZA) 24 MCG capsule TAKE ONE CAPSULE TWICE A DAY WITH MEALS 60 capsule 3  . memantine (NAMENDA) 10 MG tablet TAKE ONE TABLET BY MOUTH TWICE DAILY 180 tablet 2  . mirabegron ER (MYRBETRIQ) 50 MG TB24 tablet Take 1 tablet (50 mg total) by mouth daily. 90 tablet 1  . Multiple Vitamin (MULTIVITAMIN WITH MINERALS) TABS tablet Take 1 tablet by mouth daily.    . ondansetron (ZOFRAN) 8 MG tablet Take 8 mg by mouth every 8 (eight) hours as needed for nausea.     . polyethylene glycol (MIRALAX / GLYCOLAX) packet Take 17 g by mouth daily.    [name] venlafaxine XR (EFFEXOR-XR) 150 MG 24 hr capsule TAKE 1 CAPSULE BY MOUTH EVERY DAY 90 capsule 1  . XALKORI 250 MG capsule TAKE 1 CAPSULE (250 MG TOTAL) BY MOUTH 2 TIMES DAILY. 60 capsule 2  . benzonatate (TESSALON) 100 MG capsule Take 1 capsule (100 mg total) by mouth 3 (three) times daily as needed for cough. (Patient not  taking: Reported on 11/02/2019) 60 capsule 0  . Dextromethorphan-guaiFENesin (MUCINEX DM) 30-600 MG TB12 Take 1 tablet by mouth 2 (two) times daily as needed (for congestion/cough).    . sucralfate (CARAFATE) 1 g tablet Take 1 tablet (1 g total) by mouth 2 (two) times daily. (Patient not taking: Reported on 11/02/2019) 60 tablet 0  . triamcinolone (NASACORT) 55 MCG/ACT AERO nasal inhaler Place 2 sprays into the nose daily as needed (for congestion).      No current facility-administered medications for this visit.     PHYSICAL EXAMINATION: ECOG PERFORMANCE STATUS: 2 - Symptomatic, <50% confined to bed Vitals:   11/02/19 1313  BP: (!) 146/55  Pulse: (!) 52  Temp: (!) 96.7 F (35.9 C)  SpO2: 98%   Filed Weights   11/02/19 1313  Weight: 148 lb 4.8 oz (67.3 kg)    Physical Exam  Constitutional: She is oriented to person, place, and time. No distress.  Frail female, walks with a walker  HENT:  Head: Normocephalic and atraumatic.  Nose: Nose normal.  Mouth/Throat: Oropharynx is clear and moist. No oropharyngeal exudate.  Eyes: Pupils are equal, round, and reactive to light. EOM are normal. No scleral icterus.  Cardiovascular: Normal rate and regular rhythm.  No murmur heard. Pulmonary/Chest: Effort normal. No respiratory distress. She has no wheezes. She has no rales. She exhibits no tenderness.  Right anterior chest wall tenderness with palpation.  Abdominal: Soft. She exhibits no distension. There is no abdominal tenderness.  Musculoskeletal:        General: Edema present. Normal  range of motion.     Cervical back: Normal range of motion and neck supple.     Comments: Bilateral lower extremity 2+ edema  Neurological: She is alert and oriented to person, place, and time. No cranial nerve deficit. She exhibits normal muscle tone. Coordination normal.  Skin: Skin is warm and dry. She is not diaphoretic. No erythema.  Psychiatric: Affect normal.     CMP Latest Ref Rng & Units 11/02/2019  Glucose 70 - 99 mg/dL 115(H)  BUN 8 - 23 mg/dL 32(H)  Creatinine 0.44 - 1.00 mg/dL 1.19(H)  Sodium 135 - 145 mmol/L 141  Potassium 3.5 - 5.1 mmol/L 4.3  Chloride 98 - 111 mmol/L 108  CO2 22 - 32 mmol/L 25  Calcium 8.9 - 10.3 mg/dL 7.2(L)  Total Protein 6.5 - 8.1 g/dL 5.7(L)  Total Bilirubin 0.3 - 1.2 mg/dL 0.6  Alkaline Phos 38 - 126 U/L 86  AST 15 - 41 U/L 30  ALT 0 - 44 U/L 31   CBC Latest Ref Rng & Units 11/02/2019  WBC 4.0 - 10.5 K/uL 6.9  Hemoglobin 12.0 - 15.0 g/dL 10.0(L)  Hematocrit 36.0 - 46.0 % 30.8(L)  Platelets 150 - 400 K/uL 268    LABORATORY DATA:  I have reviewed the data as listed Lab Results  Component Value Date   WBC 6.9 11/02/2019   HGB 10.0 (L) 11/02/2019   HCT 30.8 (L) 11/02/2019   MCV 99.0 11/02/2019   PLT 268 11/02/2019   Recent Labs    09/20/19 1105 10/07/19 1348 11/02/19 1155  NA 141 140 141  K 5.0 3.8 4.3  CL 107 107 108  CO2 _0 GLUCOSE 124* 87 115*  BUN 26* 17 32*  CREATININE 1.07* 0.96 1.19*  CALCIUM 7.5* 7.6* 7.2*  GFRNONAA 49* 55* 43*  GFRAA 56* >60 50*  PROT 6.0* 6.5 5.7*  ALBUMIN 3.1* 3.2* 3.1*  AST 29 25  30  ALT 30 34 31  ALKPHOS 109 101 86  BILITOT 0.6 0.7 0.6   Iron/TIBC/Ferritin/ %Sat No results found for: IRON, TIBC, FERRITIN, IRONPCTSAT   RADIOGRAPHIC STUDIES: I have personally reviewed the radiological images as listed and agreed with the findings in the report. CT Abdomen Pelvis Wo Contrast  Result Date: 10/21/2019 CLINICAL DATA:  Lung cancer. Restaging. Also with a history of breast cancer. EXAM: CT  CHEST, ABDOMEN AND PELVIS WITHOUT CONTRAST TECHNIQUE: Multidetector CT imaging of the chest, abdomen and pelvis was performed following the standard protocol without IV contrast. COMPARISON:  PET-CT 05/10/2019.  CT chest 03/23/2019. FINDINGS: CT CHEST FINDINGS Cardiovascular: The heart size is normal. No substantial pericardial effusion. Coronary artery calcification is evident. Atherosclerotic calcification is noted in the wall of the thoracic aorta. Mediastinum/Nodes: No mediastinal lymphadenopathy. No evidence for gross hilar lymphadenopathy although assessment is limited by the lack of intravenous contrast on today's study. The esophagus has normal imaging features. No right axillary lymphadenopathy. Upper normal 10 mm short axis left axillary node stable since 03/23/2019. Lungs/Pleura: Left upper lobe pulmonary lesion measures 2.1 x 1.7 cm today, unchanged from 2.1 x 1.8 cm on 03/23/2019. Similar appearance of volume loss in the left upper lobe. Stable tiny nodules anterior right upper lobe on 35/4. 5 mm anterior right lung nodule on 53/4 is unchanged. Calcified granulomata again noted left upper lobe. Additional scattered tiny pulmonary nodules are stable. No new suspicious pulmonary nodule or mass. Peripheral airway impaction noted anterior left lower lobe (81/4) potentially related to atypical infection. No pleural effusion. Musculoskeletal: No worrisome lytic or sclerotic osseous abnormality. Multiple nonacute right-sided rib fractures noted. CT ABDOMEN PELVIS FINDINGS Hepatobiliary: No focal abnormality in the liver on this study without intravenous contrast. Gallbladder is surgically absent. No intrahepatic or extrahepatic biliary dilation. Pancreas: No focal mass lesion. No dilatation of the main duct. No intraparenchymal cyst. No peripancreatic edema. Spleen: No splenomegaly. No focal mass lesion. Adrenals/Urinary Tract: No adrenal nodule or mass. Unremarkable noncontrast appearance of the kidneys. No  evidence for hydroureter. The urinary bladder appears normal for the degree of distention. Stomach/Bowel: Stomach is unremarkable. No gastric wall thickening. No evidence of outlet obstruction. Duodenum is normally positioned as is the ligament of Treitz. No small bowel wall thickening. No small bowel dilatation. Large stool volume noted in the distal transverse and descending/sigmoid colon. Vascular/Lymphatic: There is abdominal aortic atherosclerosis without aneurysm. There is no gastrohepatic or hepatoduodenal ligament lymphadenopathy. No retroperitoneal or mesenteric lymphadenopathy. No pelvic sidewall lymphadenopathy. Reproductive: Unremarkable. Other: No intraperitoneal free fluid. Musculoskeletal: Old right pubic rami fractures noted. No worrisome lytic or sclerotic osseous abnormality. Similar appearance of the compression fracture at T11. Inferior endplate compression fracture at T9 is new in the interval. IMPRESSION: 1. Stable. No new or progressive findings. No change left upper lobe pulmonary lesion. 2. Scattered tiny bilateral pulmonary nodules, stable. 3. No evidence for metastatic disease in the abdomen or pelvis. 4. Large stool volume in the distal transverse and descending/sigmoid colon. Imaging features could be compatible with constipation in the appropriate clinical setting. 5. Inferior endplate compression fracture at T9, new in the interval with some lucency at the fracture site. Pathologic fracture cannot be completely excluded. 6. Aortic Atherosclerosis (ICD10-I70.0). Electronically Signed   By: Misty Stanley M.D.   On: 10/21/2019 13:29   CT Head Wo Contrast  Result Date: 10/31/2019 CLINICAL DATA:  Status post fall. EXAM: CT HEAD WITHOUT CONTRAST TECHNIQUE: Contiguous axial images were obtained from the base of the  skull through the vertex without intravenous contrast. COMPARISON:  None. FINDINGS: Brain: There is mild cerebral atrophy with widening of the extra-axial spaces and ventricular  dilatation. There are areas of decreased attenuation within the white matter tracts of the supratentorial brain, consistent with microvascular disease changes. Vascular: No hyperdense vessel or unexpected calcification. Skull: Normal. Negative for fracture or focal lesion. Sinuses/Orbits: No acute finding. Other: Moderate severity scalp soft tissue swelling, with an associated scalp hematoma, is seen along the posterior aspect of the vertex on the left. A mild amount of scalp soft tissue air is also noted. IMPRESSION: 1. Moderate severity scalp soft tissue swelling, with an associated scalp hematoma, seen along the posterior aspect of the vertex on the left. 2. No acute intracranial abnormality. Electronically Signed   By: Virgina Norfolk M.D.   On: 10/31/2019 19:25   CT Chest Wo Contrast  Result Date: 10/21/2019 CLINICAL DATA:  Lung cancer. Restaging. Also with a history of breast cancer. EXAM: CT CHEST, ABDOMEN AND PELVIS WITHOUT CONTRAST TECHNIQUE: Multidetector CT imaging of the chest, abdomen and pelvis was performed following the standard protocol without IV contrast. COMPARISON:  PET-CT 05/10/2019.  CT chest 03/23/2019. FINDINGS: CT CHEST FINDINGS Cardiovascular: The heart size is normal. No substantial pericardial effusion. Coronary artery calcification is evident. Atherosclerotic calcification is noted in the wall of the thoracic aorta. Mediastinum/Nodes: No mediastinal lymphadenopathy. No evidence for gross hilar lymphadenopathy although assessment is limited by the lack of intravenous contrast on today's study. The esophagus has normal imaging features. No right axillary lymphadenopathy. Upper normal 10 mm short axis left axillary node stable since 03/23/2019. Lungs/Pleura: Left upper lobe pulmonary lesion measures 2.1 x 1.7 cm today, unchanged from 2.1 x 1.8 cm on 03/23/2019. Similar appearance of volume loss in the left upper lobe. Stable tiny nodules anterior right upper lobe on 35/4. 5 mm  anterior right lung nodule on 53/4 is unchanged. Calcified granulomata again noted left upper lobe. Additional scattered tiny pulmonary nodules are stable. No new suspicious pulmonary nodule or mass. Peripheral airway impaction noted anterior left lower lobe (81/4) potentially related to atypical infection. No pleural effusion. Musculoskeletal: No worrisome lytic or sclerotic osseous abnormality. Multiple nonacute right-sided rib fractures noted. CT ABDOMEN PELVIS FINDINGS Hepatobiliary: No focal abnormality in the liver on this study without intravenous contrast. Gallbladder is surgically absent. No intrahepatic or extrahepatic biliary dilation. Pancreas: No focal mass lesion. No dilatation of the main duct. No intraparenchymal cyst. No peripancreatic edema. Spleen: No splenomegaly. No focal mass lesion. Adrenals/Urinary Tract: No adrenal nodule or mass. Unremarkable noncontrast appearance of the kidneys. No evidence for hydroureter. The urinary bladder appears normal for the degree of distention. Stomach/Bowel: Stomach is unremarkable. No gastric wall thickening. No evidence of outlet obstruction. Duodenum is normally positioned as is the ligament of Treitz. No small bowel wall thickening. No small bowel dilatation. Large stool volume noted in the distal transverse and descending/sigmoid colon. Vascular/Lymphatic: There is abdominal aortic atherosclerosis without aneurysm. There is no gastrohepatic or hepatoduodenal ligament lymphadenopathy. No retroperitoneal or mesenteric lymphadenopathy. No pelvic sidewall lymphadenopathy. Reproductive: Unremarkable. Other: No intraperitoneal free fluid. Musculoskeletal: Old right pubic rami fractures noted. No worrisome lytic or sclerotic osseous abnormality. Similar appearance of the compression fracture at T11. Inferior endplate compression fracture at T9 is new in the interval. IMPRESSION: 1. Stable. No new or progressive findings. No change left upper lobe pulmonary  lesion. 2. Scattered tiny bilateral pulmonary nodules, stable. 3. No evidence for metastatic disease in the abdomen or pelvis. 4.  Large stool volume in the distal transverse and descending/sigmoid colon. Imaging features could be compatible with constipation in the appropriate clinical setting. 5. Inferior endplate compression fracture at T9, new in the interval with some lucency at the fracture site. Pathologic fracture cannot be completely excluded. 6. Aortic Atherosclerosis (ICD10-I70.0). Electronically Signed   By: Misty Stanley M.D.   On: 10/21/2019 13:29   CT Cervical Spine Wo Contrast  Result Date: 10/31/2019 CLINICAL DATA:  Status post fall. EXAM: CT CERVICAL SPINE WITHOUT CONTRAST TECHNIQUE: Multidetector CT imaging of the cervical spine was performed without intravenous contrast. Multiplanar CT image reconstructions were also generated. COMPARISON:  None. FINDINGS: Alignment: There is approximately 2 mm anterolisthesis of the C3 on C4 vertebral body. Skull base and vertebrae: No acute fracture. No primary bone lesion or focal pathologic process. Soft tissues and spinal canal: No prevertebral fluid or swelling. No visible canal hematoma. Disc levels: Moderate severity endplate sclerosis is seen at the levels of C4-C5, C5-C6, C6-C7 and C7-T1. Moderate to marked severity intervertebral disc space narrowing is also seen at these levels. Moderate severity bilateral multilevel facet joint hypertrophy is seen. Upper chest: A 2.4 cm x 1.7 cm x 1.9 cm low-attenuation lung mass is seen within the anteromedial aspect of the left apex. A chronic posterior third right rib fracture is seen. Other: None. IMPRESSION: 1. No acute fracture within the cervical spine. 2. Moderate severity multilevel degenerative disc disease and facet joint hypertrophy. 3. 2.4 cm x 1.7 cm x 1.9 cm low-attenuation lung mass is seen within the anteromedial aspect of the left apex. This finding is concerning for the presence of a primary lung  malignancy. Further evaluation with a nonemergent chest CT is recommended. Electronically Signed   By: Virgina Norfolk M.D.   On: 10/31/2019 19:29   MR Brain W Wo Contrast  Result Date: 10/23/2019 CLINICAL DATA:  Breast and lung cancer follow-up EXAM: MRI HEAD WITHOUT AND WITH CONTRAST TECHNIQUE: Multiplanar, multiecho pulse sequences of the brain and surrounding structures were obtained without and with intravenous contrast. CONTRAST:  46m GADAVIST GADOBUTROL 1 MMOL/ML IV SOLN COMPARISON:  None. FINDINGS: Brain: No acute infarct, acute hemorrhage or extra-axial collection. Multifocal white matter hyperintensity, most commonly due to chronic ischemic microangiopathy. There is generalized atrophy without lobar predilection. No chronic microhemorrhage. Normal midline structures. There is no abnormal contrast enhancement. Vascular: Normal flow voids. Skull and upper cervical spine: Normal marrow signal. Sinuses/Orbits: Negative. Other: None. IMPRESSION: 1. No intracranial metastatic disease. 2. Generalized atrophy and chronic ischemic microangiopathy. Electronically Signed   By: KUlyses JarredM.D.   On: 10/23/2019 04:02   DG HIP UNILAT WITH PELVIS 2-3 VIEWS LEFT  Result Date: 09/21/2019 CLINICAL DATA:  Left hip pain, history of pubic ramus fracture EXAM: DG HIP (WITH OR WITHOUT PELVIS) 2-3V LEFT COMPARISON:  09/08/2018 FINDINGS: Pelvic ring is intact. Healed superior and inferior pubic rami fractures on the right are noted. Degenerative changes of lumbar spine are seen. No acute fracture or dislocation is noted. No soft tissue changes are noted. IMPRESSION: Healed pubic rami fractures on the right. No acute abnormality noted. Electronically Signed   By: MInez CatalinaM.D.   On: 09/21/2019 21:37      ASSESSMENT & PLAN:  1. Primary adenocarcinoma of left lung ([place]   2. Malignant neoplasm of left female breast, unspecified estrogen receptor status, unspecified site of breast ([place]   3. Osteopenia,  unspecified location   4. Encounter for antineoplastic chemotherapy   5. Hypocalcemia  Cancer Staging Malignant neoplasm of left female breast [facility]) Staging form: Breast, AJCC 8th Edition - Clinical stage from 12/17/2018: Stage IB (cT1b, cN1, cM0, G1, ER+, PR+, HER2-) - Signed by Earlie Server, MD on 12/17/2018  Primary adenocarcinoma of lung [facility]) Staging form: Lung, AJCC 8th Edition - Clinical stage from 09/28/2018: Stage IVA (cT2, cN3, cM1a) - Signed by Earlie Server, MD on 09/28/2018   Patient has 2 primaries.   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 Her scan in May 2021 showed stable disease MRI brain is negative for brain metastasis. Continue current regimen. Continue follow-up monthly  #Nausea has resolved #Osteopenia, stage IV lung cancer. Patient has previously been on [place].  Delton See needs to be held due to hypocalcemia. Advised patient to double her calcium supplementation to 2400 milligrams daily. Check vitamin D level  All questions were answered. The patient knows to call the clinic with any problems questions or concerns. Return of visit: keep her appointment.   Earlie Server, MD, PhD 11/02/2019

## 2019-11-03 ENCOUNTER — Other Ambulatory Visit: Payer: Self-pay

## 2019-11-03 MED ORDER — ATORVASTATIN CALCIUM 20 MG PO TABS: 20.0000 mg | ORAL_TABLET | Freq: Every day | ORAL | 0 refills | Status: DC

## 2019-11-10 ENCOUNTER — Ambulatory Visit: Payer: Medicare Other | Admitting: Family Medicine

## 2019-11-11 ENCOUNTER — Telehealth: Payer: Self-pay

## 2019-11-11 NOTE — Chronic Care Management (AMB) (Deleted)
Chronic Care Management Pharmacy  Name: Jamie Matthews  MRN: [id number] DOB: 10-20-1940   Chief Complaint/ HPI  Jamie Matthews,  79 y.o. , female presents for their Initial CCM visit with the clinical pharmacist via telephone due to COVID-19 Pandemic.  PCP : [name], MD  Their chronic conditions include: Hyperlipidemia, GERD, Depression, Osteoporosis, Osteoarthritis, Overactive Bladder and Primary adenocarcinoma of lung    Office Visits: 10/20/19: Patient presented to Dr. Diona Browner for bursitis. No medication changes made. 09/21/19: Patient presented to Dr. Lorelei Pont for bursitis. Patient received methylprednisolone x1 09/15/19: Patient presented to Dr. Damita Dunnings for bursitis. No medication changes made. 09/06/19: Patient presented to Dr. Einar Pheasant for follow-up. Sennosides, ambien stopped.   Consult Visit: 11/02/19: Patient presented to Dr. Tasia Catchings (Oncology) for adenocarcinoma. No medication changes made. 10/31/19: Patient presented to ED for laceration of scalp. 10/07/19: Patient presented to Dr. Tasia Catchings (Oncology) for adenocarcinoma. No medication changes made. 09/23/19: Patient presented to Dr. Junious Silk (Urology) for overactive bladder. No medication changes made. 09/20/19: Patient presented to Dr. Tasia Catchings (Oncology) for adenocarcinoma. No medication changes made. 08/17/19: Patient presented to ED for skin tear. 08/12/19: Patient presented to Dr. Tasia Catchings (Oncology) for adenocarcinoma. No medication changes made. Allergies  Allergen Reactions  . Sulfa Antibiotics Rash    Medications: Outpatient Encounter Medications as of 11/14/2019  Medication Sig  . acetaminophen (TYLENOL) 325 MG tablet Take 325 mg by mouth every 6 (six) hours as needed for mild pain.   [name] anastrozole (ARIMIDEX) 1 MG tablet TAKE 1 TABLET BY MOUTH DAILY  . atorvastatin (LIPITOR) 20 MG tablet Take 1 tablet (20 mg total) by mouth at bedtime.  . benzonatate (TESSALON) 100 MG capsule Take 1 capsule (100 mg total) by  mouth 3 (three) times daily as needed for cough. (Patient not taking: Reported on 11/02/2019)  . buPROPion (WELLBUTRIN XL) 150 MG 24 hr tablet TAKE ONE TABLET BY MOUTH EVERY DAY  . Calcium-Magnesium-Vitamin D (CALCIUM 1200+D3 PO) Take 1 tablet by mouth daily.  [name] desonide (DESOWEN) 0.05 % lotion Apply 1 application topically as needed (after showering).   . Dexlansoprazole (DEXILANT) 30 MG capsule Take 1 capsule (30 mg total) by mouth daily.  [name] Dextromethorphan-guaiFENesin (MUCINEX DM) 30-600 MG TB12 Take 1 tablet by mouth 2 (two) times daily as needed (for congestion/cough).  . fluticasone (FLONASE) 50 MCG/ACT nasal spray Place 2 sprays into both nostrils daily.  [name] ketoconazole (NIZORAL) 2 % shampoo Apply 1 application topically. 3 times a week  . levothyroxine (SYNTHROID) 25 MCG tablet Take 1 tablet (25 mcg total) by mouth daily.  [name] lubiprostone (AMITIZA) 24 MCG capsule TAKE ONE CAPSULE TWICE A DAY WITH MEALS  . memantine (NAMENDA) 10 MG tablet TAKE ONE TABLET BY MOUTH TWICE DAILY  . mirabegron ER (MYRBETRIQ) 50 MG TB24 tablet Take 1 tablet (50 mg total) by mouth daily.  . Multiple Vitamin (MULTIVITAMIN WITH MINERALS) TABS tablet Take 1 tablet by mouth daily.  . ondansetron (ZOFRAN) 8 MG tablet Take 8 mg by mouth every 8 (eight) hours as needed for nausea.  . polyethylene glycol (MIRALAX / GLYCOLAX) packet Take 17 g by mouth daily.  . sucralfate (CARAFATE) 1 g tablet Take 1 tablet (1 g total) by mouth 2 (two) times daily. (Patient not taking: Reported on 11/02/2019)  . triamcinolone (NASACORT) 55 MCG/ACT AERO nasal inhaler Place 2 sprays into the nose daily as needed (for congestion).   . venlafaxine XR (EFFEXOR-XR) 150 MG 24 hr capsule TAKE 1 CAPSULE BY MOUTH  EVERY DAY  . XALKORI 250 MG capsule TAKE 1 CAPSULE (250 MG TOTAL) BY MOUTH 2 TIMES DAILY.   No facility-administered encounter medications on file as of 11/14/2019.     Current Diagnosis/Assessment:    Goals Addressed   None     Hyperlipidemia   LDL goal < ***  Lipid Panel     Component Value Date/Time   CHOL 168 11/11/2017 1211   TRIG 73.0 11/11/2017 1211   HDL 53.10 11/11/2017 1211   LDLCALC 100 (H) 11/11/2017 1211   LDLDIRECT 175.9 02/05/2012 1205    Hepatic Function Latest Ref Rng & Units 11/02/2019 10/07/2019 09/20/2019  Total Protein 6.5 - 8.1 g/dL 5.7(L) 6.5 6.0(L)  Albumin 3.5 - 5.0 g/dL 3.1(L) 3.2(L) 3.1(L)  AST 15 - 41 U/L '30 25 29  ' ALT 0 - 44 U/L 31 34 30  Alk Phosphatase 38 - 126 U/L 86 101 109  Total Bilirubin 0.3 - 1.2 mg/dL 0.6 0.7 0.6     The ASCVD Risk score ([place]., et al., 2013) failed to calculate for the following reasons:   The 2013 ASCVD risk score is only valid for ages 7 to 85   Patient has failed these meds in past: *** Patient is currently {CHL Controlled/[phone]} on the following medications:  . Atorvastatin 20 mg QHS  We discussed:  {CHL HP Upstream Pharmacy discussion:[id number]}  Plan  Continue {CHL HP Upstream Pharmacy Plans:4057693345}  Osteopenia / Osteoporosis   Last DEXA Scan: ***   T-Score femoral neck: ***  T-Score total hip: ***  T-Score lumbar spine: ***  T-Score forearm radius: ***  10-year probability of major osteoporotic fracture: ***  10-year probability of hip fracture: ***  Vit D, 25-Hydroxy  Date Value Ref Range Status  09/27/2018 36.8 30.0 - 100.0 ng/mL Final    Comment:    (NOTE) Vitamin D deficiency has been defined by the Institute of Medicine and an Endocrine Society practice guideline as a level of serum 25-OH vitamin D less than 20 ng/mL (1,2). The Endocrine Society went on to further define vitamin D insufficiency as a level between 21 and 29 ng/mL (2). 1. IOM (Institute of Medicine). 2010. Dietary reference   intakes for calcium and D. [place]: The   [employer]. 2. Holick MF, Binkley [place], Bischoff-Ferrari HA, et al.   Evaluation, treatment, and prevention of vitamin D   deficiency: an  Endocrine Society clinical practice   guideline. JCEM. 2011 Jul; 96(7):1911-30. 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  Patient has failed these meds in past: *** Patient is currently {CHL Controlled/[phone]} on the following medications:  . Anastrozole 1 mg daily . Xalkori 250 mg BID  We discussed:  ***  Plan  Continue {CHL HP Upstream Pharmacy DGLOV:[id number]}  Overactive Bladder   Patient has failed these meds in past: *** Patient is currently {CHL Controlled/[phone]} on the following medications:  [name] Myrbetriq ER 50 mg daily We discussed:  ***  Plan  Continue {CHL HP Upstream Pharmacy ACZYS:0630160109}  Cough/congestion   Patient has failed these meds  in past: *** Patient is currently {CHL Controlled/Uncontrolled:[phone]} on the following medications:  . Benzonatate 100 mg TID PRN  . Mucinex DM BID PRN  . Flonase 50 mcg/act 2 spray daily . Nasocort 55 mcg/act 2 spray daily PRN   We discussed:  ***  Plan  Continue {CHL HP Upstream Pharmacy [phone]}  GERD / IBS / Iletis    Patient has failed these meds in past: *** Patient is currently {CHL Controlled/Uncontrolled:[phone]} on the following medications:  . Dexlansoprazole 30 mg daily . Lubiprostone 24 mcg BID . Sucralfate 1 g BID (not taking) . Miralax daily We discussed:  ***  Plan  Continue {CHL HP Upstream Pharmacy [phone]}  Memory Loss   Patient  has failed these meds in past: *** Patient is currently {CHL Controlled/Uncontrolled:[phone]} on the following medications:  [name] Memantine 10 mg BID  We discussed:  ***  Plan  Continue {CHL HP Upstream Pharmacy [phone]}  Depression   PHQ9 SCORE ONLY 11/11/2017 09/28/2017 06/09/2017  PHQ-9 Total Score 1 11 0   Patient has failed these meds in past: *** Patient is currently {CHL Controlled/Uncontrolled:[phone]} on the following medications:  . Wellbutrin XL 150 mg daily . Venlafaxine XR 150 mg daily We discussed:  ***  Plan  Continue {CHL HP Upstream Pharmacy [phone]}  Misc / OTC   . APAP 325 mg q6hr PRN . Desonide 0.05% lotion . Ketoconazole 2% shampoo . Levothyroxine 25 mcg daily . Multivitamin daily  . Ondansetron 67m q8hr PRN   We discussed:  ***  Plan  Continue {CHL HP Upstream Pharmacy PKQASU:[id number]} Vaccines   Reviewed and discussed patient's vaccination history.    Immunization History  Administered Date(s) Administered  . Fluad Quad(high Dose 65+) 02/01/2019  . Influenza Split 02/05/2012  . Influenza, High Dose Seasonal PF 03/23/2018  . Influenza,inj,Quad PF,6+ Mos 02/15/2015, 02/24/2017  . Influenza-Unspecified 02/23/2013  . Pneumococcal Conjugate-13 12/13/2013  . Pneumococcal Polysaccharide-23 07/18/2015  . Tdap 08/17/2019    Plan  Recommended patient receive *** vaccine in *** office/pharmacy.   Medication Management   [facility] for [facility] medications Uses pill box? {Yes or If no, why not?:20788} Pt endorses ***% compliance  We discussed: ***  Plan  {US Pharmacy PFEXM:14709}   Follow up: *** month phone visit  ***

## 2019-11-11 NOTE — Telephone Encounter (Signed)
Per written referral from PCP, requesting referral in Epic for [name] to chronic care management pharmacy services for the following conditions:   GERD [K21.9]  Hyperlipidemia [E78.5]  Debbora Dus, PharmD Clinical Pharmacist [facility] [facility] at [facility] [phone]

## 2019-11-14 ENCOUNTER — Telehealth: Payer: Medicare Other

## 2019-11-14 ENCOUNTER — Telehealth: Payer: Self-pay

## 2019-11-14 NOTE — Chronic Care Management (AMB) (Deleted)
Chronic Care Management Pharmacy  Name: Jamie Matthews  MRN: [id number] DOB: Apr 04, 1941   Chief Complaint/ HPI  Jamie Matthews,  79 y.o. , female presents for their Initial CCM visit with the clinical pharmacist via telephone due to COVID-19 Pandemic.  PCP : [name], MD  Their chronic conditions include: Hyperlipidemia, GERD, Depression, Osteoporosis, Osteoarthritis, Overactive Bladder and Primary adenocarcinoma of lung    Office Visits: 10/20/19: Patient presented to Dr. Diona Browner for bursitis. No medication changes made. 09/21/19: Patient presented to Dr. Lorelei Pont for bursitis. Patient received methylprednisolone x1 09/15/19: Patient presented to Dr. Damita Dunnings for bursitis. No medication changes made. 09/06/19: Patient presented to Dr. Einar Pheasant for follow-up. Sennosides, ambien stopped.   Consult Visit: 11/02/19: Patient presented to Dr. Tasia Catchings (Oncology) for adenocarcinoma. No medication changes made. 10/31/19: Patient presented to ED for laceration of scalp. 10/07/19: Patient presented to Dr. Tasia Catchings (Oncology) for adenocarcinoma. No medication changes made. 09/23/19: Patient presented to Dr. Junious Silk (Urology) for overactive bladder. No medication changes made. 09/20/19: Patient presented to Dr. Tasia Catchings (Oncology) for adenocarcinoma. No medication changes made. 08/17/19: Patient presented to ED for skin tear. 08/12/19: Patient presented to Dr. Tasia Catchings (Oncology) for adenocarcinoma. No medication changes made. Allergies  Allergen Reactions   Sulfa Antibiotics Rash    Medications: Outpatient Encounter Medications as of 11/14/2019  Medication Sig   acetaminophen (TYLENOL) 325 MG tablet Take 325 mg by mouth every 6 (six) hours as needed for mild pain.    anastrozole (ARIMIDEX) 1 MG tablet TAKE 1 TABLET BY MOUTH DAILY   atorvastatin (LIPITOR) 20 MG tablet Take 1 tablet (20 mg total) by mouth at bedtime.   benzonatate (TESSALON) 100 MG capsule Take 1 capsule (100 mg total) by  mouth 3 (three) times daily as needed for cough. (Patient not taking: Reported on 11/02/2019)   buPROPion (WELLBUTRIN XL) 150 MG 24 hr tablet TAKE ONE TABLET BY MOUTH EVERY DAY   Calcium-Magnesium-Vitamin D (CALCIUM 1200+D3 PO) Take 1 tablet by mouth daily.   desonide (DESOWEN) 0.05 % lotion Apply 1 application topically as needed (after showering).    Dexlansoprazole (DEXILANT) 30 MG capsule Take 1 capsule (30 mg total) by mouth daily.   Dextromethorphan-guaiFENesin (MUCINEX DM) 30-600 MG TB12 Take 1 tablet by mouth 2 (two) times daily as needed (for congestion/cough).   fluticasone (FLONASE) 50 MCG/ACT nasal spray Place 2 sprays into both nostrils daily.   ketoconazole (NIZORAL) 2 % shampoo Apply 1 application topically. 3 times a week   levothyroxine (SYNTHROID) 25 MCG tablet Take 1 tablet (25 mcg total) by mouth daily.   lubiprostone (AMITIZA) 24 MCG capsule TAKE ONE CAPSULE TWICE A DAY WITH MEALS   memantine (NAMENDA) 10 MG tablet TAKE ONE TABLET BY MOUTH TWICE DAILY   mirabegron ER (MYRBETRIQ) 50 MG TB24 tablet Take 1 tablet (50 mg total) by mouth daily.   Multiple Vitamin (MULTIVITAMIN WITH MINERALS) TABS tablet Take 1 tablet by mouth daily.   ondansetron (ZOFRAN) 8 MG tablet Take 8 mg by mouth every 8 (eight) hours as needed for nausea.   polyethylene glycol (MIRALAX / GLYCOLAX) packet Take 17 g by mouth daily.   sucralfate (CARAFATE) 1 g tablet Take 1 tablet (1 g total) by mouth 2 (two) times daily. (Patient not taking: Reported on 11/02/2019)   triamcinolone (NASACORT) 55 MCG/ACT AERO nasal inhaler Place 2 sprays into the nose daily as needed (for congestion).    venlafaxine XR (EFFEXOR-XR) 150 MG 24 hr capsule TAKE 1 CAPSULE BY MOUTH  EVERY DAY   XALKORI 250 MG capsule TAKE 1 CAPSULE (250 MG TOTAL) BY MOUTH 2 TIMES DAILY.   No facility-administered encounter medications on file as of 11/14/2019.     Current Diagnosis/Assessment:     Goals Addressed   None     Hyperlipidemia   LDL goal < ***  Lipid Panel     Component Value Date/Time   CHOL 168 11/11/2017 1211   TRIG 73.0 11/11/2017 1211   HDL 53.10 11/11/2017 1211   LDLCALC 100 (H) 11/11/2017 1211   LDLDIRECT 175.9 02/05/2012 1205    Hepatic Function Latest Ref Rng & Units 11/02/2019 10/07/2019 09/20/2019  Total Protein 6.5 - 8.1 g/dL 5.7(L) 6.5 6.0(L)  Albumin 3.5 - 5.0 g/dL 3.1(L) 3.2(L) 3.1(L)  AST 15 - 41 U/L '30 25 29  ' ALT 0 - 44 U/L 31 34 30  Alk Phosphatase 38 - 126 U/L 86 101 109  Total Bilirubin 0.3 - 1.2 mg/dL 0.6 0.7 0.6     The ASCVD Risk score ([place]., et al., 2013) failed to calculate for the following reasons:   The 2013 ASCVD risk score is only valid for ages 68 to 3   Patient has failed these meds in past: *** Patient is currently {CHL Controlled/Uncontrolled:[phone]} on the following medications:   Atorvastatin 20 mg QHS  We discussed:  {CHL HP Upstream Pharmacy discussion:[phone]}  Plan  Continue {CHL HP Upstream Pharmacy Plans:[phone]}  Osteopenia / Osteoporosis   Last DEXA Scan: ***   T-Score femoral neck: ***  T-Score total hip: ***  T-Score lumbar spine: ***  T-Score forearm radius: ***  10-year probability of major osteoporotic fracture: ***  10-year probability of hip fracture: ***  Vit D, 25-Hydroxy  Date Value Ref Range Status  09/27/2018 36.8 30.0 - 100.0 ng/mL Final    Comment:    (NOTE) Vitamin D deficiency has been defined by the Institute of Medicine and an Endocrine Society practice guideline as a level of serum 25-OH vitamin D less than 20 ng/mL (1,2). The Endocrine Society went on to further define vitamin D insufficiency as a level between 21 and 29 ng/mL (2). 1. IOM (Institute of Medicine). 2010. Dietary reference   intakes for calcium and D. [place]: The   [employer]. 2. Holick MF, Binkley [place], Bischoff-Ferrari HA, et al.   Evaluation, treatment, and prevention of vitamin D   deficiency: an  Endocrine Society clinical practice   guideline. JCEM. 2011 Jul; 96(7):1911-30. 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  Patient has failed these meds in past: *** Patient is currently {CHL Controlled/Uncontrolled:[phone]} on the following medications:   Anastrozole 1 mg daily  Xalkori 250 mg BID  We discussed:  ***  Plan  Continue {CHL HP Upstream Pharmacy HWTUU:8280034917}  Overactive Bladder   BMP Latest Ref Rng & Units 11/02/2019 10/07/2019 09/20/2019  Glucose 70 - 99 mg/dL 115(H) 87 124(H)  BUN 8 - 23 mg/dL 32(H) 17 26(H)  Creatinine 0.44 - 1.00 mg/dL 1.19(H) 0.96 1.07(H)  Sodium 135 - 145 mmol/L 141 140 141  Potassium 3.5 - 5.1 mmol/L 4.3 3.8 5.0  Chloride 98 - 111 mmol/L 108 107 107  CO2 22 - 32 mmol/L '25 28 27  ' Calcium 8.9 - 10.3 mg/dL 7.2(L) 7.6(L) 7.5(L)   Patient has failed these meds in past: *** Patient is currently {CHL Controlled/Uncontrolled:[id number]} on the following medications:   Myrbetriq ER 50 mg daily We discussed:  ***  Plan  Continue {CHL HP Upstream Pharmacy [phone]}  Cough/congestion   Patient has failed these meds in past: *** Patient is currently {CHL Controlled/Uncontrolled:5348885397} on the following medications:   Benzonatate 100 mg TID PRN   Mucinex DM BID PRN   Flonase 50 mcg/act 2 spray daily  Nasocort 55 mcg/act 2 spray daily PRN   We discussed:  ***  Plan  Continue {CHL HP Upstream Pharmacy  [phone]}  GERD / IBS / Iletis    Patient has failed these meds in past: *** Patient is currently {CHL Controlled/Uncontrolled:[id number]} on the following medications:   Dexlansoprazole 30 mg daily  Lubiprostone 24 mcg BID  Sucralfate 1 g BID (not taking)  Miralax daily We discussed:  ***  Plan  Continue {CHL HP Upstream Pharmacy [phone]}  Memory Loss   Patient has failed these meds in past: *** Patient is currently {CHL Controlled/Uncontrolled:5348885397} on the following medications:   Memantine 10 mg BID  We discussed:  ***  Plan  Continue {CHL HP Upstream Pharmacy [phone]}  Depression   PHQ9 SCORE ONLY 11/11/2017 09/28/2017 06/09/2017  PHQ-9 Total Score 1 11 0   Patient has failed these meds in past: *** Patient is currently {CHL Controlled/Uncontrolled:5348885397} on the following medications:   Wellbutrin XL 150 mg daily  Venlafaxine XR 150 mg daily (still taking?) We discussed:  ***  Plan  Continue {CHL HP Upstream Pharmacy [phone]}  Misc / OTC    APAP 325 mg q6hr PRN  Desonide 0.05% lotion  Ketoconazole 2% shampoo  Levothyroxine 25 mcg daily  Multivitamin daily   Ondansetron 71m q8hr PRN   We discussed:  ***  Plan  Continue {CHL HP Upstream Pharmacy PXJOIT:[id number]} Vaccines   Reviewed and discussed patient's vaccination history.    Immunization History  Administered Date(s) Administered   Fluad Quad(high Dose 65+) 02/01/2019   Influenza Split 02/05/2012   Influenza, High Dose Seasonal PF 03/23/2018   Influenza,inj,Quad PF,6+ Mos 02/15/2015, 02/24/2017   Influenza-Unspecified 02/23/2013   Pneumococcal Conjugate-13 12/13/2013   Pneumococcal Polysaccharide-23 07/18/2015   Tdap 08/17/2019    Plan  Recommended patient receive *** vaccine in *** office/pharmacy.   Medication Management   Pt uses [facility] pharmacy for all medications Uses pill box? {Yes or If no, why  not?:20788} Pt endorses ***% compliance  We discussed: ***  Plan  {US Pharmacy PAXEN:40768}   Follow up: *** month phone visit  MDebbora Dus PharmD Clinical Pharmacist [phone]

## 2019-11-14 NOTE — Telephone Encounter (Signed)
Unable to reach patient by telephone for initial CCM visit today at 9:00 AM. Left voicemail with contact information.  Debbora Dus, PharmD Clinical Pharmacist [place] [facility] at [facility] [phone]

## 2019-11-16 MED FILL — XALKORI 250 MG CAPSULE: 250 | 30 days supply | Qty: 60 | Fill #2

## 2019-11-24 ENCOUNTER — Other Ambulatory Visit: Payer: Self-pay | Admitting: Oncology

## 2019-11-28 DIAGNOSIS — R296 Repeated falls: Secondary | ICD-10-CM | POA: Diagnosis not present

## 2019-11-28 DIAGNOSIS — R2689 Other abnormalities of gait and mobility: Secondary | ICD-10-CM | POA: Diagnosis not present

## 2019-11-28 DIAGNOSIS — R278 Other lack of coordination: Secondary | ICD-10-CM | POA: Diagnosis not present

## 2019-11-28 DIAGNOSIS — M6281 Muscle weakness (generalized): Secondary | ICD-10-CM | POA: Diagnosis not present

## 2019-11-30 ENCOUNTER — Observation Stay: Payer: Medicare Other

## 2019-11-30 ENCOUNTER — Observation Stay
Admission: EM | Admit: 2019-11-30 | Discharge: 2019-12-01 | Disposition: A | Discharge status: Home / Self Care | Payer: Medicare Other | Attending: Internal Medicine | Admitting: Internal Medicine

## 2019-11-30 ENCOUNTER — Other Ambulatory Visit: Payer: Self-pay

## 2019-11-30 ENCOUNTER — Inpatient Hospital Stay: Payer: Medicare Other

## 2019-11-30 ENCOUNTER — Inpatient Hospital Stay: Payer: Medicare Other | Attending: Oncology

## 2019-11-30 ENCOUNTER — Inpatient Hospital Stay (HOSPITAL_BASED_OUTPATIENT_CLINIC_OR_DEPARTMENT_OTHER): Payer: Medicare Other | Admitting: Oncology

## 2019-11-30 ENCOUNTER — Encounter: Payer: Self-pay | Admitting: Oncology

## 2019-11-30 ENCOUNTER — Encounter: Payer: Self-pay | Admitting: Emergency Medicine

## 2019-11-30 DIAGNOSIS — S0003XA Contusion of scalp, initial encounter: Secondary | ICD-10-CM | POA: Insufficient documentation

## 2019-11-30 DIAGNOSIS — K581 Irritable bowel syndrome with constipation: Secondary | ICD-10-CM | POA: Diagnosis not present

## 2019-11-30 DIAGNOSIS — C50912 Malignant neoplasm of unspecified site of left female breast: Secondary | ICD-10-CM

## 2019-11-30 DIAGNOSIS — Z5111 Encounter for antineoplastic chemotherapy: Secondary | ICD-10-CM | POA: Diagnosis not present

## 2019-11-30 DIAGNOSIS — C50919 Malignant neoplasm of unspecified site of unspecified female breast: Secondary | ICD-10-CM | POA: Diagnosis not present

## 2019-11-30 DIAGNOSIS — Z170 Estrogen receptor positive status [ER+]: Secondary | ICD-10-CM | POA: Insufficient documentation

## 2019-11-30 DIAGNOSIS — C7800 Secondary malignant neoplasm of unspecified lung: Secondary | ICD-10-CM | POA: Insufficient documentation

## 2019-11-30 DIAGNOSIS — M7989 Other specified soft tissue disorders: Secondary | ICD-10-CM | POA: Insufficient documentation

## 2019-11-30 DIAGNOSIS — Z882 Allergy status to sulfonamides status: Secondary | ICD-10-CM | POA: Insufficient documentation

## 2019-11-30 DIAGNOSIS — K219 Gastro-esophageal reflux disease without esophagitis: Secondary | ICD-10-CM | POA: Insufficient documentation

## 2019-11-30 DIAGNOSIS — C3492 Malignant neoplasm of unspecified part of left bronchus or lung: Secondary | ICD-10-CM

## 2019-11-30 DIAGNOSIS — R799 Abnormal finding of blood chemistry, unspecified: Secondary | ICD-10-CM | POA: Diagnosis present

## 2019-11-30 DIAGNOSIS — R001 Bradycardia, unspecified: Secondary | ICD-10-CM | POA: Insufficient documentation

## 2019-11-30 DIAGNOSIS — Z20822 Contact with and (suspected) exposure to covid-19: Secondary | ICD-10-CM | POA: Insufficient documentation

## 2019-11-30 DIAGNOSIS — I700 Atherosclerosis of aorta: Secondary | ICD-10-CM | POA: Insufficient documentation

## 2019-11-30 DIAGNOSIS — S2241XA Multiple fractures of ribs, right side, initial encounter for closed fracture: Secondary | ICD-10-CM | POA: Insufficient documentation

## 2019-11-30 DIAGNOSIS — G8929 Other chronic pain: Secondary | ICD-10-CM | POA: Insufficient documentation

## 2019-11-30 DIAGNOSIS — C3412 Malignant neoplasm of upper lobe, left bronchus or lung: Secondary | ICD-10-CM | POA: Insufficient documentation

## 2019-11-30 DIAGNOSIS — K589 Irritable bowel syndrome without diarrhea: Secondary | ICD-10-CM | POA: Insufficient documentation

## 2019-11-30 DIAGNOSIS — R5383 Other fatigue: Secondary | ICD-10-CM | POA: Insufficient documentation

## 2019-11-30 DIAGNOSIS — M25552 Pain in left hip: Secondary | ICD-10-CM | POA: Diagnosis not present

## 2019-11-30 DIAGNOSIS — R52 Pain, unspecified: Secondary | ICD-10-CM

## 2019-11-30 DIAGNOSIS — Z79899 Other long term (current) drug therapy: Secondary | ICD-10-CM | POA: Insufficient documentation

## 2019-11-30 DIAGNOSIS — Z803 Family history of malignant neoplasm of breast: Secondary | ICD-10-CM | POA: Insufficient documentation

## 2019-11-30 DIAGNOSIS — R110 Nausea: Secondary | ICD-10-CM | POA: Insufficient documentation

## 2019-11-30 DIAGNOSIS — Z79811 Long term (current) use of aromatase inhibitors: Secondary | ICD-10-CM | POA: Insufficient documentation

## 2019-11-30 DIAGNOSIS — M8580 Other specified disorders of bone density and structure, unspecified site: Secondary | ICD-10-CM

## 2019-11-30 DIAGNOSIS — Z8719 Personal history of other diseases of the digestive system: Secondary | ICD-10-CM | POA: Insufficient documentation

## 2019-11-30 DIAGNOSIS — N1831 Chronic kidney disease, stage 3a: Secondary | ICD-10-CM | POA: Insufficient documentation

## 2019-11-30 DIAGNOSIS — R42 Dizziness and giddiness: Secondary | ICD-10-CM | POA: Insufficient documentation

## 2019-11-30 DIAGNOSIS — X58XXXA Exposure to other specified factors, initial encounter: Secondary | ICD-10-CM | POA: Insufficient documentation

## 2019-11-30 DIAGNOSIS — E785 Hyperlipidemia, unspecified: Secondary | ICD-10-CM | POA: Insufficient documentation

## 2019-11-30 DIAGNOSIS — Z9049 Acquired absence of other specified parts of digestive tract: Secondary | ICD-10-CM | POA: Insufficient documentation

## 2019-11-30 DIAGNOSIS — M47816 Spondylosis without myelopathy or radiculopathy, lumbar region: Secondary | ICD-10-CM | POA: Insufficient documentation

## 2019-11-30 DIAGNOSIS — R079 Chest pain, unspecified: Secondary | ICD-10-CM | POA: Diagnosis not present

## 2019-11-30 DIAGNOSIS — Z833 Family history of diabetes mellitus: Secondary | ICD-10-CM | POA: Insufficient documentation

## 2019-11-30 DIAGNOSIS — C50812 Malignant neoplasm of overlapping sites of left female breast: Secondary | ICD-10-CM | POA: Insufficient documentation

## 2019-11-30 DIAGNOSIS — R531 Weakness: Secondary | ICD-10-CM

## 2019-11-30 DIAGNOSIS — Z8249 Family history of ischemic heart disease and other diseases of the circulatory system: Secondary | ICD-10-CM | POA: Insufficient documentation

## 2019-11-30 LAB — CBC WITH DIFFERENTIAL/PLATELET
Abs Immature Granulocytes: 0.04 10*3/uL (ref 0.00–0.07)
Abs Immature Granulocytes: 0.04 10*3/uL (ref 0.00–0.07)
Basophils Absolute: 0 10*3/uL (ref 0.0–0.1)
Basophils Absolute: 0 10*3/uL (ref 0.0–0.1)
Basophils Relative: 1 %
Basophils Relative: 1 %
Eosinophils Absolute: 0.4 10*3/uL (ref 0.0–0.5)
Eosinophils Absolute: 0.4 10*3/uL (ref 0.0–0.5)
Eosinophils Relative: 5 %
Eosinophils Relative: 6 %
HCT: 30.9 % — ABNORMAL LOW (ref 36.0–46.0)
HCT: 31.4 % — ABNORMAL LOW (ref 36.0–46.0)
Hemoglobin: 10.5 g/dL — ABNORMAL LOW (ref 12.0–15.0)
Hemoglobin: 10.4 g/dL — ABNORMAL LOW (ref 12.0–15.0)
Immature Granulocytes: 1 %
Immature Granulocytes: 1 %
Lymphocytes Relative: 24 %
Lymphocytes Relative: 29 %
Lymphs Abs: 1.6 10*3/uL (ref 0.7–4.0)
Lymphs Abs: 1.8 10*3/uL (ref 0.7–4.0)
MCH: 32.7 pg (ref 26.0–34.0)
MCH: 31.9 pg (ref 26.0–34.0)
MCHC: 34 g/dL (ref 30.0–36.0)
MCHC: 33.1 g/dL (ref 30.0–36.0)
MCV: 96.3 fL (ref 80.0–100.0)
MCV: 96.3 fL (ref 80.0–100.0)
Monocytes Absolute: 1.5 10*3/uL — ABNORMAL HIGH (ref 0.1–1.0)
Monocytes Absolute: 1.2 10*3/uL — ABNORMAL HIGH (ref 0.1–1.0)
Monocytes Relative: 22 %
Monocytes Relative: 20 %
Neutro Abs: 3.2 10*3/uL (ref 1.7–7.7)
Neutro Abs: 2.8 10*3/uL (ref 1.7–7.7)
Neutrophils Relative %: 47 %
Neutrophils Relative %: 43 %
Platelets: 300 10*3/uL (ref 150–400)
Platelets: 321 10*3/uL (ref 150–400)
RBC: 3.21 MIL/uL — ABNORMAL LOW (ref 3.87–5.11)
RBC: 3.26 MIL/uL — ABNORMAL LOW (ref 3.87–5.11)
RDW: 13.4 % (ref 11.5–15.5)
RDW: 13.5 % (ref 11.5–15.5)
WBC: 6.7 10*3/uL (ref 4.0–10.5)
WBC: 6.3 10*3/uL (ref 4.0–10.5)
nRBC: 0 % (ref 0.0–0.2)
nRBC: 0 % (ref 0.0–0.2)

## 2019-11-30 LAB — COMPREHENSIVE METABOLIC PANEL
ALT: 41 U/L (ref 0–44)
ALT: 41 U/L (ref 0–44)
AST: 32 U/L (ref 15–41)
AST: 33 U/L (ref 15–41)
Albumin: 3.5 g/dL (ref 3.5–5.0)
Albumin: 3.5 g/dL (ref 3.5–5.0)
Alkaline Phosphatase: 99 U/L (ref 38–126)
Alkaline Phosphatase: 96 U/L (ref 38–126)
Anion gap: 9 (ref 5–15)
Anion gap: 11 (ref 5–15)
BUN: 48 mg/dL — ABNORMAL HIGH (ref 8–23)
BUN: 44 mg/dL — ABNORMAL HIGH (ref 8–23)
CO2: 29 mmol/L (ref 22–32)
CO2: 27 mmol/L (ref 22–32)
Calcium: 7.1 mg/dL — ABNORMAL LOW (ref 8.9–10.3)
Calcium: 6.9 mg/dL — ABNORMAL LOW (ref 8.9–10.3)
Chloride: 97 mmol/L — ABNORMAL LOW (ref 98–111)
Chloride: 95 mmol/L — ABNORMAL LOW (ref 98–111)
Creatinine, Ser: 1.47 mg/dL — ABNORMAL HIGH (ref 0.44–1.00)
Creatinine, Ser: 1.42 mg/dL — ABNORMAL HIGH (ref 0.44–1.00)
GFR calc Af Amer: 38 mL/min — ABNORMAL LOW (ref >60)
GFR calc Af Amer: 40 mL/min — ABNORMAL LOW (ref >60)
GFR calc non Af Amer: 33 mL/min — ABNORMAL LOW (ref >60)
GFR calc non Af Amer: 35 mL/min — ABNORMAL LOW (ref >60)
Glucose, Bld: 83 mg/dL (ref 70–99)
Glucose, Bld: 115 mg/dL — ABNORMAL HIGH (ref 70–99)
Potassium: 4.6 mmol/L (ref 3.5–5.1)
Potassium: 3.9 mmol/L (ref 3.5–5.1)
Sodium: 135 mmol/L (ref 135–145)
Sodium: 133 mmol/L — ABNORMAL LOW (ref 135–145)
Total Bilirubin: 0.5 mg/dL (ref 0.3–1.2)
Total Bilirubin: 0.6 mg/dL (ref 0.3–1.2)
Total Protein: 6.4 g/dL — ABNORMAL LOW (ref 6.5–8.1)
Total Protein: 6.5 g/dL (ref 6.5–8.1)

## 2019-11-30 LAB — BASIC METABOLIC PANEL
Anion gap: 8 (ref 5–15)
BUN: 40 mg/dL — ABNORMAL HIGH (ref 8–23)
CO2: 28 mmol/L (ref 22–32)
Calcium: 6.9 mg/dL — ABNORMAL LOW (ref 8.9–10.3)
Chloride: 98 mmol/L (ref 98–111)
Creatinine, Ser: 1.31 mg/dL — ABNORMAL HIGH (ref 0.44–1.00)
GFR calc Af Amer: 44 mL/min — ABNORMAL LOW (ref >60)
GFR calc non Af Amer: 38 mL/min — ABNORMAL LOW (ref >60)
Glucose, Bld: 113 mg/dL — ABNORMAL HIGH (ref 70–99)
Potassium: 3.8 mmol/L (ref 3.5–5.1)
Sodium: 134 mmol/L — ABNORMAL LOW (ref 135–145)

## 2019-11-30 LAB — SARS CORONAVIRUS 2 (TAT 6-24 HRS): SARS Coronavirus 2: NEGATIVE

## 2019-11-30 LAB — VITAMIN D 25 HYDROXY (VIT D DEFICIENCY, FRACTURES): Vit D, 25-Hydroxy: 24.47 ng/mL — ABNORMAL LOW (ref 30–100)

## 2019-11-30 LAB — MAGNESIUM: Magnesium: 3.8 mg/dL — ABNORMAL HIGH (ref 1.7–2.4)

## 2019-11-30 MED ORDER — VENLAFAXINE HCL ER 75 MG PO CP24
150.0000 mg | ORAL_CAPSULE | Freq: Every day | ORAL | Status: DC
  Administered 2019-11-30 – 2019-12-01 (×2): 150 mg via ORAL
  Filled 2019-11-30 (×2): qty 1
  Filled 2019-11-30: qty 2

## 2019-11-30 MED ORDER — SENNOSIDES-DOCUSATE SODIUM 8.6-50 MG PO TABS: 1.0000 | ORAL_TABLET | Freq: Every evening | ORAL | Status: DC | PRN

## 2019-11-30 MED ORDER — CRIZOTINIB 250 MG PO CAPS: 250.0000 mg | ORAL_CAPSULE | Freq: Two times a day (BID) | ORAL | Status: DC

## 2019-11-30 MED ORDER — ENOXAPARIN SODIUM 40 MG/0.4ML ~~LOC~~ SOLN
40.0000 mg | SUBCUTANEOUS | Status: DC
  Administered 2019-11-30: 40 mg via SUBCUTANEOUS
  Filled 2019-11-30: qty 0.4

## 2019-11-30 MED ORDER — PANTOPRAZOLE SODIUM 40 MG PO TBEC
40.0000 mg | DELAYED_RELEASE_TABLET | Freq: Every day | ORAL | Status: DC
  Administered 2019-11-30 – 2019-12-01 (×2): 40 mg via ORAL
  Filled 2019-11-30 (×2): qty 1

## 2019-11-30 MED ORDER — MIRABEGRON ER 50 MG PO TB24
50.0000 mg | ORAL_TABLET | Freq: Every day | ORAL | Status: DC
  Administered 2019-11-30 – 2019-12-01 (×2): 50 mg via ORAL
  Filled 2019-11-30 (×2): qty 1

## 2019-11-30 MED ORDER — FLUTICASONE PROPIONATE 50 MCG/ACT NA SUSP
2.0000 | Freq: Every day | NASAL | Status: DC | PRN
  Filled 2019-11-30: qty 16

## 2019-11-30 MED ORDER — HYDRALAZINE HCL 20 MG/ML IJ SOLN: 5.0000 mg | Freq: Three times a day (TID) | INTRAMUSCULAR | Status: DC | PRN

## 2019-11-30 MED ORDER — ADULT MULTIVITAMIN W/MINERALS CH
1.0000 | ORAL_TABLET | Freq: Every day | ORAL | Status: DC
  Administered 2019-12-01: 09:00:00 1 via ORAL
  Filled 2019-11-30: qty 1

## 2019-11-30 MED ORDER — ONDANSETRON HCL 4 MG PO TABS: 8.0000 mg | ORAL_TABLET | Freq: Three times a day (TID) | ORAL | Status: DC | PRN

## 2019-11-30 MED ORDER — ATORVASTATIN CALCIUM 20 MG PO TABS
20.0000 mg | ORAL_TABLET | Freq: Every day | ORAL | Status: DC
  Administered 2019-11-30: 20 mg via ORAL
  Filled 2019-11-30: qty 1

## 2019-11-30 MED ORDER — SODIUM CHLORIDE 0.9% FLUSH
3.0000 mL | Freq: Two times a day (BID) | INTRAVENOUS | Status: DC
  Administered 2019-11-30 – 2019-12-01 (×2): 3 mL via INTRAVENOUS

## 2019-11-30 MED ORDER — MEMANTINE HCL 5 MG PO TABS
10.0000 mg | ORAL_TABLET | Freq: Two times a day (BID) | ORAL | Status: DC
  Administered 2019-11-30 – 2019-12-01 (×2): 10 mg via ORAL
  Filled 2019-11-30 (×2): qty 2

## 2019-11-30 MED ORDER — ANASTROZOLE 1 MG PO TABS
1.0000 mg | ORAL_TABLET | Freq: Every day | ORAL | Status: DC
  Administered 2019-11-30 – 2019-12-01 (×2): 1 mg via ORAL
  Filled 2019-11-30 (×2): qty 1

## 2019-11-30 MED ORDER — LACTATED RINGERS IV BOLUS
1000.0000 mL | Freq: Once | INTRAVENOUS | Status: AC
  Administered 2019-11-30: 1000 mL via INTRAVENOUS

## 2019-11-30 MED ORDER — HYDROCODONE-ACETAMINOPHEN 5-325 MG PO TABS: 1.0000 | ORAL_TABLET | ORAL | Status: DC | PRN

## 2019-11-30 MED ORDER — LEVOTHYROXINE SODIUM 25 MCG PO TABS
25.0000 ug | ORAL_TABLET | Freq: Every day | ORAL | Status: DC
  Administered 2019-12-01: 25 ug via ORAL
  Filled 2019-11-30: qty 1

## 2019-11-30 MED ORDER — CALCIUM GLUCONATE-NACL 1-0.675 GM/50ML-% IV SOLN
1.0000 g | Freq: Once | INTRAVENOUS | Status: AC
  Administered 2019-11-30: 1000 mg via INTRAVENOUS
  Filled 2019-11-30: qty 50

## 2019-11-30 MED ORDER — ACETAMINOPHEN 325 MG PO TABS: 325.0000 mg | ORAL_TABLET | Freq: Four times a day (QID) | ORAL | Status: DC | PRN

## 2019-11-30 MED ORDER — BUPROPION HCL ER (XL) 150 MG PO TB24
150.0000 mg | ORAL_TABLET | Freq: Every day | ORAL | Status: DC
  Administered 2019-11-30 – 2019-12-01 (×2): 150 mg via ORAL
  Filled 2019-11-30 (×2): qty 1

## 2019-11-30 MED ORDER — SODIUM CHLORIDE 0.9 % IV SOLN
2.0000 g | Freq: Once | INTRAVENOUS | Status: DC
  Filled 2019-11-30: qty 20

## 2019-11-30 NOTE — ED Notes (Signed)
Dietary notified to send meal tray.

## 2019-11-30 NOTE — Progress Notes (Signed)
Patient has left hip pain that is being treated for bursitis by PCP.

## 2019-11-30 NOTE — ED Provider Notes (Signed)
[facility] Emergency Department Provider Note   ____________________________________________   First MD Initiated Contact with Patient 11/30/19 1334     (approximate)  I have reviewed the triage vital signs and the nursing notes.   HISTORY  Chief Complaint Abnormal Lab    HPI Jamie Matthews is a 79 y.o. female with past medical history of breast cancer and metastatic lung cancer as well as history listed below presents to the ED complaining of abnormal labs.  Patient was at her oncologist office earlier today and on routine lab work there was found to have hypocalcemia.  Patient states that she has been feeling slightly weaker than usual, but denies any fevers, cough, chest pain, shortness of breath, dysuria, or hematuria.  She complains of some left hip pain which is chronic.  She does admit that she was not taking her calcium supplements as prescribed last week, restarted taking them yesterday.  She was also noted to be bradycardic at her oncologist office and there was concerned that the hypocalcemia was contributing to bradycardia.  She was then advised to be evaluated in the ED.        Past Medical History:  Diagnosis Date  . AKI (acute kidney injury) ([place]) 08/17/2018  . Anxiety   . Arthritis   . Belching   . Bladder disorder    OVERACTIVE  . Bowel dysfunction    BLOCKAGE  . Cancer (HCC)    breast  . Constipation   . Depression   . Diverticulitis   . Fibromyalgia   . GERD (gastroesophageal reflux disease)   . Hyperlipidemia   . IBS (irritable bowel syndrome)   . Internal hemorrhoids   . Lung cancer ([place]) 2020  . Memory deficits   . Murmur    asymptomatic  . Pneumonia 11/18/12  . Urinary incontinence   . Vertigo     Patient Active Problem List   Diagnosis Date Noted  . Hypocalcemia 11/02/2019  . Trochanteric bursitis 09/20/2019  . Pruritus 09/06/2019  . Overactive bladder 09/06/2019  . Chronic pain of right knee  09/06/2019  . Multiple rib fractures 07/15/2019  . Fall as cause of accidental injury at home as place of occurrence 07/15/2019  . Encounter for antineoplastic chemotherapy 12/17/2018  . Lower leg edema 12/17/2018  . Aromatase inhibitor use 10/24/2018  . Osteopenia 10/24/2018  . Fever 10/10/2018  . Closed displaced fracture of pubis ([place]) 09/20/2018  . Localized swelling of right lower leg 09/20/2018  . AKI (acute kidney injury) ([place]) 08/17/2018  . Myofascial pain 07/28/2018  . Primary adenocarcinoma of lung ([place]) 07/21/2018  . Malignant neoplasm of left female breast ([place]) 07/21/2018  . Adenopathy   . Goals of care, counseling/discussion 06/26/2018  . IBS (irritable bowel syndrome) 05/25/2018  . BPV (benign positional vertigo) 07/30/2017  . Unilateral primary osteoarthritis, left knee 06/24/2017  . Bunion of great toe of right foot 06/09/2017  . Dizziness 03/24/2017  . Decreased appetite 12/03/2016  . Fatigue 09/22/2016  . Insomnia 09/22/2016  . Depression, recurrent ([place]) 09/22/2016  . Chronic cough 08/21/2016  . Primary localized osteoarthrosis, hand 03/19/2016  . OA (osteoarthritis) of knee 03/17/2016  . Stress due to illness of family member 10/10/2014  . Ileitis 06/22/2014  . Chronic insomnia 09/20/2013  . DNR (do not resuscitate) 11/15/2012  . Chronic constipation 04/01/2012  . 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 Urinary incontinence   . Major depressive disorder, recurrent, moderate   . Gastroesophageal reflux disease   .  Hyperlipidemia     Past Surgical History:  Procedure Laterality Date  . AXILLARY LYMPH NODE BIOPSY Left 06/08/2018   INVASIVE MAMMARY CARCINOMA  . BLADDER SUSPENSION  2004, 2012  . BREAST BIOPSY Left 06/08/2018   INVASIVE MAMMARY CARCINOMA  . CATARACT EXTRACTION W/PHACO Right 08/27/2015   Procedure: CATARACT EXTRACTION PHACO AND INTRAOCULAR LENS PLACEMENT (IOC);  Surgeon: Estill Cotta, MD;  Location: [facility] ORS;  Service: Ophthalmology;  Laterality:  Right;  [place]   1:00.2 AP%  22.5 CDE  23.67 fluid casette lot #[id number]  exp05/31/2018  . CATARACT EXTRACTION W/PHACO Left 10/15/2015   Procedure: CATARACT EXTRACTION PHACO AND INTRAOCULAR LENS PLACEMENT (IOC);  Surgeon: Estill Cotta, MD;  Location: [facility] ORS;  Service: Ophthalmology;  Laterality: Left;  [place] 01:07 AP% 18.1 CDE 21.57 fluid pack lot # [id number]  . CHOLECYSTECTOMY    . COLONOSCOPY  2017  . ELECTROMAGNETIC NAVIGATION BROCHOSCOPY N/A 07/09/2018   Procedure: ELECTROMAGNETIC NAVIGATION BRONCHOSCOPY;  Surgeon: Flora Lipps, MD;  Location: [facility] ORS;  Service: Cardiopulmonary;  Laterality: N/A;  . TONSILLECTOMY  1947    Prior to Admission medications   Medication Sig Start Date End Date Taking? Authorizing Provider  acetaminophen (TYLENOL) 325 MG tablet Take 325 mg by mouth every 6 (six) hours as needed for mild pain.     [provider]  anastrozole (ARIMIDEX) 1 MG tablet TAKE 1 TABLET BY MOUTH DAILY 07/26/19   Earlie Server, MD  atorvastatin (LIPITOR) 20 MG tablet Take 1 tablet (20 mg total) by mouth at bedtime. 11/03/19   [name], MD  benzonatate (TESSALON) 100 MG capsule Take 1 capsule (100 mg total) by mouth 3 (three) times daily as needed for cough. Patient not taking: Reported on 11/02/2019 04/19/19   [name], MD  buPROPion (WELLBUTRIN XL) 150 MG 24 hr tablet TAKE ONE TABLET BY MOUTH EVERY DAY 09/29/18   Lucille Passy, MD  Calcium-Magnesium-Vitamin D (CALCIUM 1200+D3 PO) Take 1 tablet by mouth daily.    [provider]  desonide (DESOWEN) 0.05 % lotion Apply 1 application topically as needed (after showering).     [provider]  Dexlansoprazole (DEXILANT) 30 MG capsule Take 1 capsule (30 mg total) by mouth daily. 05/05/19   Borders, Kirt Boys, NP  Dextromethorphan-guaiFENesin (MUCINEX DM) 30-600 MG TB12 Take 1 tablet by mouth 2 (two) times daily as needed (for congestion/cough).    [provider]  fluticasone (FLONASE) 50 MCG/ACT nasal  spray Place 2 sprays into both nostrils daily. 11/25/18   [name], MD  ketoconazole (NIZORAL) 2 % shampoo Apply 1 application topically. 3 times a week 04/20/18   [provider]  levothyroxine (SYNTHROID) 25 MCG tablet Take 1 tablet (25 mcg total) by mouth daily. 09/09/19   [name], MD  lubiprostone (AMITIZA) 24 MCG capsule TAKE ONE CAPSULE TWICE A DAY WITH MEALS 02/09/18   Pyrtle, Lajuan Lines, MD  meloxicam (MOBIC) 7.5 MG tablet Take 7.5 mg by mouth daily. 11/24/19   [provider]  memantine (NAMENDA) 10 MG tablet TAKE ONE TABLET BY MOUTH TWICE DAILY 09/29/18   Lucille Passy, MD  mirabegron ER (MYRBETRIQ) 50 MG TB24 tablet Take 1 tablet (50 mg total) by mouth daily. 09/13/19   [name], MD  Multiple Vitamin (MULTIVITAMIN WITH MINERALS) TABS tablet Take 1 tablet by mouth daily.    [provider]  omeprazole (PRILOSEC) 40 MG capsule Take 40 mg by mouth daily.    [provider]  ondansetron (  ZOFRAN) 8 MG tablet TAKE ONE TABLET EVERY EIGHT HOURS AS NEEDED FOR NAUSEA / VOMITING 11/24/19   Earlie Server, MD  polyethylene glycol [facility] / GLYCOLAX) packet Take 17 g by mouth daily.    [provider]  sucralfate (CARAFATE) 1 g tablet Take 1 tablet (1 g total) by mouth 2 (two) times daily. Patient not taking: Reported on 11/02/2019 03/15/19   Earlie Server, MD  triamcinolone (NASACORT) 55 MCG/ACT AERO nasal inhaler Place 2 sprays into the nose daily as needed (for congestion).     [provider]  venlafaxine XR (EFFEXOR-XR) 150 MG 24 hr capsule TAKE 1 CAPSULE BY MOUTH EVERY DAY 06/28/19   Lucille Passy, MD  Hulda Humphrey 250 MG capsule TAKE 1 CAPSULE (250 MG TOTAL) BY MOUTH 2 TIMES DAILY. 09/22/19   Earlie Server, MD    Allergies Sulfa antibiotics  Family History  Problem Relation Age of Onset  . Diabetes Father   . Heart disease Father   . Lymphoma Father   . Heart disease Mother   . Breast cancer Sister 40  . Colon cancer Neg Hx   . Esophageal cancer  Neg Hx   . Rectal cancer Neg Hx   . Stomach cancer Neg Hx   . Bladder Cancer Neg Hx   . Kidney cancer Neg Hx     Social History Social History   Tobacco Use  . Smoking status: Never Smoker  . Smokeless tobacco: Never Used  Vaping Use  . Vaping Use: Never used  Substance Use Topics  . Alcohol use: Yes    Comment: rare wine  . Drug use: No    Review of Systems  Constitutional: No fever/chills.  Positive for generalized weakness. Eyes: No visual changes. ENT: No sore throat. Cardiovascular: Denies chest pain. Respiratory: Denies shortness of breath. Gastrointestinal: No abdominal pain.  No nausea, no vomiting.  No diarrhea.  No constipation. Genitourinary: Negative for dysuria. Musculoskeletal: Negative for back pain.  Positive for left hip pain. Skin: Negative for rash. Neurological: Negative for headaches, focal weakness or numbness.  ____________________________________________   PHYSICAL EXAM:  VITAL SIGNS: ED Triage Vitals  Enc Vitals Group     BP 11/30/19 1342 112/70     Pulse Rate 11/30/19 1342 (!) 47     Resp 11/30/19 1342 17     Temp 11/30/19 1342 98.7 F (37.1 C)     Temp Source 11/30/19 1342 Oral     SpO2 11/30/19 1342 95 %     Weight 11/30/19 1310 154 lb 8.7 oz (70.1 kg)     Height 11/30/19 1310 5\' 3"  (1.6 m)     Head Circumference --      Peak Flow --      Pain Score 11/30/19 1310 0     Pain Loc --      Pain Edu? --      Excl. in [place]? --     Constitutional: Alert and oriented. Eyes: Conjunctivae are normal. Head: Atraumatic. Nose: No congestion/rhinnorhea. Mouth/Throat: Mucous membranes are moist. Neck: Normal ROM Cardiovascular: Bradycardic, regular rhythm. Grossly normal heart sounds. Respiratory: Normal respiratory effort.  No retractions. Lungs CTAB. Gastrointestinal: Soft and nontender. No distention. Genitourinary: deferred Musculoskeletal: No lower extremity tenderness nor edema. Neurologic:  Normal speech and language. No gross  focal neurologic deficits are appreciated. Skin:  Skin is warm, dry and intact. No rash noted. Psychiatric: Mood and affect are normal. Speech and behavior are normal.  ____________________________________________   LABS (all labs ordered are  listed, but only abnormal results are displayed)  Labs Reviewed  MAGNESIUM - Abnormal; Notable for the following components:      Result Value   Magnesium 3.8 (*)    All other components within normal limits  CBC WITH DIFFERENTIAL/PLATELET - Abnormal; Notable for the following components:   RBC 3.26 (*)    Hemoglobin 10.4 (*)    HCT 31.4 (*)    Monocytes Absolute 1.2 (*)    All other components within normal limits  COMPREHENSIVE METABOLIC PANEL - Abnormal; Notable for the following components:   Sodium 133 (*)    Chloride 95 (*)    Glucose, Bld 115 (*)    BUN 44 (*)    Creatinine, Ser 1.42 (*)    Calcium 6.9 (*)    GFR calc non Af Amer 35 (*)    GFR calc Af Amer 40 (*)    All other components within normal limits  SARS CORONAVIRUS 2 (TAT 6-24 HRS)  SARS CORONAVIRUS 2 (TAT 6-24 HRS)  CALCIUM, IONIZED   ____________________________________________  EKG  ED ECG REPORT I, Blake Divine, the attending physician, personally viewed and interpreted this ECG.   Date: 11/30/2019  EKG Time: 13:44  Rate: 48  Rhythm: sinus bradycardia  Axis: Normal  Intervals:none  ST&T Change: None   PROCEDURES  Procedure(s) performed (including Critical Care):  Procedures   ____________________________________________   INITIAL IMPRESSION / ASSESSMENT AND PLAN / ED COURSE       -year-old female with past medical history of breast cancer and metastatic lung cancer presents to the ED for abnormal electrolytes discovered on outpatient labs.  Lab work here redemonstrates hypocalcemia, even when corrected for low normal albumin.  Patient also noted to be hypomagnesemic.  We will hydrate with IV fluids and give dose of IV calcium gluconate, which  should help with her hypocalcemia and hypermagnesemia.  EKG is consistent with sinus bradycardia with no acute ischemic changes.  Given concern that her electrolyte abnormalities could be contributing to bradycardia, case was discussed with hospitalist for admission.      ____________________________________________   FINAL CLINICAL IMPRESSION(S) / ED DIAGNOSES  Final diagnoses:  Hypocalcemia  Hypermagnesemia     ED Discharge Orders    None       Note:  This document was prepared using Dragon voice recognition software and may include unintentional dictation errors.   Blake Divine, MD 11/30/19 [phone]

## 2019-11-30 NOTE — H&P (Signed)
History and Physical        Hospital Admission Note Date: 11/30/2019  Patient name: Jamie Matthews Medical record number: [id number] Date of birth: Jul 02, 1940 Age: 79 y.o. Gender: female  PCP: [name], MD  Patient coming from: Home Lives with: husband At baseline, ambulates: walker  Chief Complaint    Chief Complaint  Patient presents with  . Abnormal Lab      HPI:   This is a very pleasant 79 year old female with a history of CKD 3a, breast cancer and metastatic lung cancer as well as IBS and left hip pain which is being evaluated by her PCP currently and has outpatient follow up tomorrow for this who presented to the ED today with abnormal labs after visiting her Oncologist, Dr. Tasia Catchings. She has been complaining of feeling generalized weakness for the past several days-week and was noted to be bradycardic and hypocalcemic at her oncologist's office today and advised to be evaluated in the ED. Noted to have not been taking her calcium supplement at home for the past week as she felt this was causing constipation. Denies chest pain, palpitations, nausea, vomiting, falls, focal weakness. Admits to constipation and left hip pain without radiation down leg.  ED Course: Notable Labs - Na 133, BUN/Cr 44/1.42, Ca 6.9 (7.3 when corrected for albumin), Mg 3.8, Hb 10.4, COVID 19 pending. Given 1g Ca gluconate and 1L LR bolus   ED vitals:  Vitals:   11/30/19 1441 11/30/19 1531  BP:    Pulse: (!) 47 79  Resp: 16 14  Temp:    SpO2: 97%      Review of Systems:  Review of Systems  Constitutional: Negative for chills and fever.  HENT: Negative.   Eyes: Negative.   Respiratory: Negative for cough and shortness of breath.   Cardiovascular: Negative for chest pain and palpitations.  Gastrointestinal: Positive for constipation and heartburn. Negative for abdominal pain.    Genitourinary: Negative for dysuria.  Musculoskeletal: Positive for joint pain. Negative for myalgias.  Neurological: Positive for weakness. Negative for dizziness and headaches.  Psychiatric/Behavioral: Negative.   All other systems reviewed and are negative.   Medical/Social/Family History   Past Medical History: Past Medical History:  Diagnosis Date  . AKI (acute kidney injury) ([place]) 08/17/2018  . Anxiety   . Arthritis   . Belching   . Bladder disorder    OVERACTIVE  . Bowel dysfunction    BLOCKAGE  . Cancer ([facility])    breast  . Constipation   . Depression   . Diverticulitis   . Fibromyalgia   . GERD (gastroesophageal reflux disease)   . Hyperlipidemia   . IBS (irritable bowel syndrome)   . Internal hemorrhoids   . Lung cancer ([place]) 2020  . Memory deficits   . Murmur    asymptomatic  . Pneumonia 11/18/12  . Urinary incontinence   . Vertigo     Past Surgical History:  Procedure Laterality Date  . AXILLARY LYMPH NODE BIOPSY Left 06/08/2018   INVASIVE MAMMARY CARCINOMA  . BLADDER SUSPENSION  2004, 2012  . BREAST BIOPSY Left 06/08/2018   INVASIVE MAMMARY CARCINOMA  . CATARACT EXTRACTION W/PHACO Right 08/27/2015   Procedure:  CATARACT EXTRACTION PHACO AND INTRAOCULAR LENS PLACEMENT (IOC);  Surgeon: Estill Cotta, MD;  Location: [facility] ORS;  Service: Ophthalmology;  Laterality: Right;  [place]   1:00.2 AP%  22.5 CDE  23.67 fluid casette lot #[id number]  exp05/31/2018  . CATARACT EXTRACTION W/PHACO Left 10/15/2015   Procedure: CATARACT EXTRACTION PHACO AND INTRAOCULAR LENS PLACEMENT (IOC);  Surgeon: Estill Cotta, MD;  Location: [facility] ORS;  Service: Ophthalmology;  Laterality: Left;  [place] 01:07 AP% 18.1 CDE 21.57 fluid pack lot # [id number]  . CHOLECYSTECTOMY    . COLONOSCOPY  2017  . ELECTROMAGNETIC NAVIGATION BROCHOSCOPY N/A 07/09/2018   Procedure: ELECTROMAGNETIC NAVIGATION BRONCHOSCOPY;  Surgeon: Flora Lipps, MD;  Location: [facility] ORS;  Service: Cardiopulmonary;   Laterality: N/A;  . TONSILLECTOMY  1947    Medications: Prior to Admission medications   Medication Sig Start Date End Date Taking? Authorizing Provider  acetaminophen (TYLENOL) 325 MG tablet Take 325 mg by mouth every 6 (six) hours as needed for mild pain.     [provider]  anastrozole (ARIMIDEX) 1 MG tablet TAKE 1 TABLET BY MOUTH DAILY Patient taking differently: Take 1 mg by mouth daily.  07/26/19   Earlie Server, MD  atorvastatin (LIPITOR) 20 MG tablet Take 1 tablet (20 mg total) by mouth at bedtime. 11/03/19   [name], MD  benzonatate (TESSALON) 100 MG capsule Take 1 capsule (100 mg total) by mouth 3 (three) times daily as needed for cough. Patient not taking: Reported on 11/02/2019 04/19/19   [name], MD  buPROPion (WELLBUTRIN XL) 150 MG 24 hr tablet TAKE ONE TABLET BY MOUTH EVERY DAY Patient taking differently: Take 150 mg by mouth daily.  09/29/18   Lucille Passy, MD  Calcium-Magnesium-Vitamin D (CALCIUM 1200+D3 PO) Take 1 tablet by mouth daily.    [provider]  desonide (DESOWEN) 0.05 % lotion Apply 1 application topically as needed (after showering).     [provider]  Dexlansoprazole (DEXILANT) 30 MG capsule Take 1 capsule (30 mg total) by mouth daily. 05/05/19   Borders, Kirt Boys, NP  Dextromethorphan-guaiFENesin (MUCINEX DM) 30-600 MG TB12 Take 1 tablet by mouth 2 (two) times daily as needed (for congestion/cough).    [provider]  fluticasone (FLONASE) 50 MCG/ACT nasal spray Place 2 sprays into both nostrils daily. 11/25/18   [name], MD  ketoconazole (NIZORAL) 2 % shampoo Apply 1 application topically. 3 times a week 04/20/18   [provider]  levothyroxine (SYNTHROID) 25 MCG tablet Take 1 tablet (25 mcg total) by mouth daily. 09/09/19   [name], MD  lubiprostone (AMITIZA) 24 MCG capsule TAKE ONE CAPSULE TWICE A DAY WITH MEALS Patient taking differently: Take 24 mcg by mouth 2 (two) times daily with a  meal. TAKE ONE CAPSULE TWICE A DAY WITH MEALS 02/09/18   Pyrtle, Lajuan Lines, MD  meloxicam (MOBIC) 7.5 MG tablet Take 7.5 mg by mouth daily. 11/24/19   [provider]  memantine (NAMENDA) 10 MG tablet TAKE ONE TABLET BY MOUTH TWICE DAILY Patient taking differently: Take 10 mg by mouth 2 (two) times daily.  09/29/18   Lucille Passy, MD  mirabegron ER (MYRBETRIQ) 50 MG TB24 tablet Take 1 tablet (50 mg total) by mouth daily. 09/13/19   [name], MD  Multiple Vitamin (MULTIVITAMIN WITH MINERALS) TABS tablet Take 1 tablet by mouth daily.    [provider]  omeprazole (PRILOSEC) 40 MG capsule Take 40 mg by mouth daily.    [provider]  ondansetron (ZOFRAN) 8 MG tablet TAKE ONE TABLET EVERY EIGHT HOURS AS NEEDED FOR NAUSEA / VOMITING Patient taking differently: Take 8 mg by mouth every 8 (eight) hours as needed for nausea or vomiting.  11/24/19   Earlie Server, MD  polyethylene glycol [facility] Floria Raveling) packet Take 17 g by mouth daily.    [provider]  sucralfate (CARAFATE) 1 g tablet Take 1 tablet (1 g total) by mouth 2 (two) times daily. Patient not taking: Reported on 11/02/2019 03/15/19   Earlie Server, MD  triamcinolone (NASACORT) 55 MCG/ACT AERO nasal inhaler Place 2 sprays into the nose daily as needed (for congestion).     [provider]  venlafaxine XR (EFFEXOR-XR) 150 MG 24 hr capsule TAKE 1 CAPSULE BY MOUTH EVERY DAY 06/28/19   Lucille Passy, MD  Hulda Humphrey 250 MG capsule TAKE 1 CAPSULE (250 MG TOTAL) BY MOUTH 2 TIMES DAILY. 09/22/19   Earlie Server, MD    Allergies:   Allergies  Allergen Reactions  . Sulfa Antibiotics Rash    Social History:  reports that she has never smoked. She has never used smokeless tobacco. She reports current alcohol use. She reports that she does not use drugs.  Family History: Family History  Problem Relation Age of Onset  . Diabetes Father   . Heart disease Father   . Lymphoma Father   . Heart disease Mother   . Breast  cancer Sister 3  . Colon cancer Neg Hx   . Esophageal cancer Neg Hx   . Rectal cancer Neg Hx   . Stomach cancer Neg Hx   . Bladder Cancer Neg Hx   . Kidney cancer Neg Hx      Objective   Physical Exam: Blood pressure (!) 109/57, pulse 79, temperature 98.7 F (37.1 C), temperature source Oral, resp. rate 14, height 5\' 3"  (1.6 m), weight 70.1 kg, SpO2 97 %.  Physical Exam Vitals and nursing note reviewed.  Constitutional:      Appearance: Normal appearance.  HENT:     Head: Normocephalic and atraumatic.  Eyes:     Conjunctiva/sclera: Conjunctivae normal.  Cardiovascular:     Rate and Rhythm: Regular rhythm. Bradycardia present.     Comments: 2/6 systolic murmur in aortic region HR 50s, asymptomatic Pulmonary:     Effort: Pulmonary effort is normal.     Breath sounds: Normal breath sounds.  Abdominal:     General: Abdomen is flat.     Palpations: Abdomen is soft.  Musculoskeletal:        General: No swelling or tenderness.  Skin:    Coloration: Skin is not jaundiced or pale.  Neurological:     Mental Status: She is alert. Mental status is at baseline.  Psychiatric:        Mood and Affect: Mood normal.        Behavior: Behavior normal.     LABS on Admission: I have personally reviewed all the labs and imaging below    Basic Metabolic Panel: Recent Labs  Lab 11/30/19 1023 11/30/19 1142 11/30/19 1426  NA 135  --  133*  K 4.6  --  3.9  CL 97*  --  95*  CO2 29  --  27  GLUCOSE 83  --  115*  BUN 48*  --  44*  CREATININE 1.47*  --  1.42*  CALCIUM 7.1*  --  6.9*  MG  --  3.8*  --    Liver Function Tests:  Recent Labs  Lab 11/30/19 1023 11/30/19 1426  AST 32 33  ALT 41 41  ALKPHOS 99 96  BILITOT 0.5 0.6  PROT 6.4* 6.5  ALBUMIN 3.5 3.5   No results for input(s): LIPASE, AMYLASE in the last 168 hours. No results for input(s): AMMONIA in the last 168 hours. CBC: Recent Labs  Lab 11/30/19 1023 11/30/19 1023 11/30/19 1426  WBC 6.7  --  6.3    NEUTROABS 3.2   < > 2.8  HGB 10.5*  --  10.4*  HCT 30.9*  --  31.4*  MCV 96.3   < > 96.3  PLT 300  --  321   < > = values in this interval not displayed.   Cardiac Enzymes: No results for input(s): CKTOTAL, CKMB, CKMBINDEX, TROPONINI in the last 168 hours. BNP: Invalid input(s): POCBNP CBG: No results for input(s): GLUCAP in the last 168 hours.  Radiological Exams on Admission:  No results found.    EKG: Independently reviewed. Left axis deviation, nonspecific TWI in III, and V1, HR 48   A & P   Active Problems:   Hypocalcemia   1. Generalized weakness secondary to Hypocalcemia a. Continue to replace Ca b. Repeat labs this evening and in AM c. PT eval  2. Left hip pain a. Being evaluated by her PCP  b. Pain upon sitting forward c. Hold NSAIDs d. Tylenol and Norco PRN e. Left hip Xray and outpatient follow up f. PT eval  3. History of Breast Cancer and Metastatic lung cancer a. Follows with oncology outpatient b. Continue home meds   4. Elevated creatinine from baseline in setting of CKD 3a a. Follow up after IV fluids b. Hold NSAIDs  5. Asymptomatic Bradycardia a. Continue to replace Calcium b. Continue telemetry c. Hold beta blockers  6. IBS with constipation a. [place]    DVT prophylaxis: lovenox   Code Status: Prior  Diet: regular Family Communication: Admission, patients condition and plan of care including tests being ordered have been discussed with the patient who indicates understanding and agrees with the plan and Code Status. Called patient's husband with no response Disposition Plan: The appropriate patient status for this patient is OBSERVATION. Observation status is judged to be reasonable and necessary in order to provide the required intensity of service to ensure the patient's safety. The patient's presenting symptoms, physical exam findings, and initial radiographic and laboratory data in the context of their medical condition is felt  to place them at decreased risk for further clinical deterioration. Furthermore, it is anticipated that the patient will be medically stable for discharge from the hospital within 2 midnights of admission. The following factors support the patient status of observation.   " The patient's presenting symptoms include generalized weakness. " The physical exam findings include left hip pain, bradycardia. " The initial radiographic and laboratory data are hypocalcemia, elevated creatinine.    Status is: Observation  The patient remains OBS appropriate and will d/c before 2 midnights.  Dispo: The patient is from: Home              Anticipated d/c is to: TBD              Anticipated d/c date is: 1 day              Patient currently is not medically stable to d/c.     Consultants  . none  Procedures  . none  Time Spent on Admission: 66 minutes  Harold Hedge, DO [facility] Pager [phone] 11/30/2019, 4:25 PM

## 2019-11-30 NOTE — ED Notes (Signed)
Pharmacy at bedside

## 2019-11-30 NOTE — Progress Notes (Signed)
Hematology/Oncology follow up note [facility] [phone] Fax:([phone]) [phone]   Patient Care Team: [name], MD as PCP - General (Family Medicine) Osborne Oman, MD as Consulting Physician (Obstetrics and Gynecology) Pyrtle, Lajuan Lines, MD as Consulting Physician (Gastroenterology) Telford Nab, RN as Registered Nurse Earlie Server, MD as Consulting Physician (Hematology and Oncology) Debbora Dus, [facility] as Pharmacist (Pharmacist)   REASON FOR VISIT:  Follow up for management of lung cancer and breast cancer  HISTORY OF PRESENTING ILLNESS:  Jamie Matthews is a  79 y.o.  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 05/05/2018 bilateral diagnostic breast mammogram showed suspicious mass 1.1cm  in the 12:00 retroareolar region of the left breast and the left axillary lymph node. 06/08/2018 patient status post a left breast retroareolar and left axillary lymph node biopsy. Pathology showed invasive mammary carcinoma, no special type, grade 1, left axillary lymph node positive for invasive mammary carcinoma clinically metastatic.  Background lymph node architecture is not identified. ER> 90% PR> 90%, HER-2 negative.  PET scan done which unfortunately showed additional hypermetabolic bilateral hilar lymph nodes as well as left upper lobe lung mass which may represent a focus of metastatic disease from breast, or primary lung neoplasm.  There is multiple additional small pulmonary nodules are scattered throughout both lungs which are worrisome for metastasis.  Index nodule within the medial right upper lobe measures 7 mm,, peri-broncho-vascular nodule in the right lower lobe measures 1.2 cm.  # Stage IV lung cancer-  Biopsy of lung mass left upper lobe showed non-small cell lung cancer, favor adenocarcinoma.  #NGS came back patient has PD L1 is 70% TPS, MET fusion mutation.  #Mid-March 2020 started on  crizotinib  Bilateral lower extremity edema, right >left, Ultrasound venous right 09/20/2018 no DVT.  2D echo 10/10/2018 showed LVEF 55 to 60% Most likely secondary to crizotinib side effects.  # 12/15/2018 CT chest images were independently reviewed and discussed with patient. Dominant left upper lobe nodule and multiple scattered irregular pulmonary parenchymal nodular lesions are stable. Mild basilar pulmonary parenchymal septal thickening is new and can be seen with pulmonary edema. Patient reports no shortness of breath asymptomatic  #12/29/2018 unilateral left diagnostic mammogram with ultrasound images were independently reviewed and discussed with patient. Slight interval reduction of the size of the left retroareolar breast mass, 9 x 6 x 7 mm, comparing to 10 x 9 x 8 mm. Axillary lymph node measured 1.3 x 1.0 x 1.0 cm, previously 2.2 x 1.4 x 1.5 cm,  there were 2 other abdominal lymph nodes in the left axilla, which were not reported in previous ultrasound. Discussed with patient that overall, breast cancer responded to endocrine treatments.  Additional 2 lymph nodes need to be closely monitored.  Recommend patient to continue Arimidex.   INTERVAL HISTORY Jamie Matthews is a 79 y.o. female who has above history reviewed by me today presents for acute visit for nausea, and constipation.  Patient feels pretty well today.  She missed her last appointment and this is a reschedule visit. She has hypocalcemia and at the last visit, I increased her oral calcium dosage. Today initially she reports she has been compliant with taking calcium. She reports occasionally feeling lightheaded.  Otherwise at baseline. She has left hip pain and was considered to have bursitis and she follows up with primary care provider.  She also takes Mobic recently for pain Chronic lower extremity swelling for  which she uses compression stocking.  She was previously on Lasix which was stopped by primary  care provider due to high risk for fall.  She self started Lasix recently.   Review of Systems  Constitutional: Positive for fatigue. Negative for appetite change, chills and fever.  HENT:   Negative for hearing loss and voice change.   Eyes: Negative for eye problems.  Respiratory: Negative for chest tightness and cough.   Cardiovascular: Positive for leg swelling. Negative for chest pain.  Gastrointestinal: Positive for constipation. Negative for abdominal distention, abdominal pain, blood in stool and nausea.  Endocrine: Negative for hot flashes.  Genitourinary: Negative for difficulty urinating and frequency.   Musculoskeletal: Negative for arthralgias.  Skin: Negative for itching and rash.  Neurological: Positive for light-headedness. Negative for extremity weakness.  Hematological: Negative for adenopathy.  Psychiatric/Behavioral: Negative for confusion and sleep disturbance.    MEDICAL HISTORY:  Past Medical History:  Diagnosis Date  . AKI (acute kidney injury) ([place]) 08/17/2018  . Anxiety   . Arthritis   . Belching   . Bladder disorder    OVERACTIVE  . Bowel dysfunction    BLOCKAGE  . Cancer (HCC)    breast  . Constipation   . Depression   . Diverticulitis   . Fibromyalgia   . GERD (gastroesophageal reflux disease)   . Hyperlipidemia   . IBS (irritable bowel syndrome)   . Internal hemorrhoids   . Lung cancer ([place]) 2020  . Memory deficits   . Murmur    asymptomatic  . Pneumonia 11/18/12  . Urinary incontinence   . Vertigo     SURGICAL HISTORY: Past Surgical History:  Procedure Laterality Date  . AXILLARY LYMPH NODE BIOPSY Left 06/08/2018   INVASIVE MAMMARY CARCINOMA  . BLADDER SUSPENSION  2004, 2012  . BREAST BIOPSY Left 06/08/2018   INVASIVE MAMMARY CARCINOMA  . CATARACT EXTRACTION W/PHACO Right 08/27/2015   Procedure: CATARACT EXTRACTION PHACO AND INTRAOCULAR LENS PLACEMENT (IOC);  Surgeon: Estill Cotta, MD;  Location: [facility] ORS;  Service:  Ophthalmology;  Laterality: Right;  [place]   1:00.2 AP%  22.5 CDE  23.67 fluid casette lot #[id number]  exp05/31/2018  . CATARACT EXTRACTION W/PHACO Left 10/15/2015   Procedure: CATARACT EXTRACTION PHACO AND INTRAOCULAR LENS PLACEMENT (IOC);  Surgeon: Estill Cotta, MD;  Location: [facility] ORS;  Service: Ophthalmology;  Laterality: Left;  [place] 01:07 AP% 18.1 CDE 21.57 fluid pack lot # [id number]  . CHOLECYSTECTOMY    . COLONOSCOPY  2017  . ELECTROMAGNETIC NAVIGATION BROCHOSCOPY N/A 07/09/2018   Procedure: ELECTROMAGNETIC NAVIGATION BRONCHOSCOPY;  Surgeon: Flora Lipps, MD;  Location: [facility] ORS;  Service: Cardiopulmonary;  Laterality: N/A;  . TONSILLECTOMY  1947    SOCIAL HISTORY: Social History   Socioeconomic History  . Marital status: Married    Spouse name: Bennye Alm  . Number of children: 0  . Years of education: Master's Degree  . Highest education level: Not on file  Occupational History  . Occupation: Retired  Tobacco Use  . Smoking status: Never Smoker  . Smokeless tobacco: Never Used  Vaping Use  . Vaping Use: Never used  Substance and Sexual Activity  . Alcohol use: Yes    Comment: rare wine  . Drug use: No  . Sexual activity: Not Currently  Other Topics Concern  . Not on file  Social History Narrative   Recently moved with her husband to [place] from [place].   Husband is a retired Pharmacist, community.   No children.   She is a  retired Pharmacist, hospital.   Enjoys: Chief Technology Officer, reading - mysteries and biographies, cooking   Exercise: walking, gardening   Diet: low appetite due to cancer treatment, grazing   She is a DNR.   Social Determinants of Health   Financial Resource Strain:   . Difficulty of Paying Living Expenses:   Food Insecurity:   . Worried About Charity fundraiser in the Last Year:   . Arboriculturist in the Last Year:   Transportation Needs:   . Film/video editor (Medical):   [name] Lack of Transportation (Non-Medical):   Physical Activity:   . Days of Exercise  per Week:   . Minutes of Exercise per Session:   Stress:   . Feeling of Stress :   Social Connections:   . Frequency of Communication with Friends and Family:   . Frequency of Social Gatherings with Friends and Family:   . Attends Religious Services:   . Active Member of Clubs or Organizations:   . Attends Archivist Meetings:   [name] Marital Status:   Intimate Partner Violence:   . Fear of Current or Ex-Partner:   . Emotionally Abused:   [name] Physically Abused:   . Sexually Abused:     FAMILY HISTORY: Family History  Problem Relation Age of Onset  . Diabetes Father   . Heart disease Father   . Lymphoma Father   . Heart disease Mother   . Breast cancer Sister 15  . Colon cancer Neg Hx   . Esophageal cancer Neg Hx   . Rectal cancer Neg Hx   . Stomach cancer Neg Hx   . Bladder Cancer Neg Hx   . Kidney cancer Neg Hx     ALLERGIES:  is allergic to sulfa antibiotics.  MEDICATIONS:  Current Outpatient Medications  Medication Sig Dispense Refill  . acetaminophen (TYLENOL) 325 MG tablet Take 325 mg by mouth every 6 (six) hours as needed for mild pain.     [name] anastrozole (ARIMIDEX) 1 MG tablet TAKE 1 TABLET BY MOUTH DAILY 30 tablet 3  . atorvastatin (LIPITOR) 20 MG tablet Take 1 tablet (20 mg total) by mouth at bedtime. 90 tablet 0  . buPROPion (WELLBUTRIN XL) 150 MG 24 hr tablet TAKE ONE TABLET BY MOUTH EVERY DAY 90 tablet 2  . Calcium-Magnesium-Vitamin D (CALCIUM 1200+D3 PO) Take 1 tablet by mouth daily.    [name] desonide (DESOWEN) 0.05 % lotion Apply 1 application topically as needed (after showering).     . Dexlansoprazole (DEXILANT) 30 MG capsule Take 1 capsule (30 mg total) by mouth daily. 90 capsule 3  . Dextromethorphan-guaiFENesin (MUCINEX DM) 30-600 MG TB12 Take 1 tablet by mouth 2 (two) times daily as needed (for congestion/cough).    . fluticasone (FLONASE) 50 MCG/ACT nasal spray Place 2 sprays into both nostrils daily. 16 g 6  . ketoconazole (NIZORAL) 2 % shampoo  Apply 1 application topically. 3 times a week    . levothyroxine (SYNTHROID) 25 MCG tablet Take 1 tablet (25 mcg total) by mouth daily. 30 tablet 1  . lubiprostone (AMITIZA) 24 MCG capsule TAKE ONE CAPSULE TWICE A DAY WITH MEALS 60 capsule 3  . memantine (NAMENDA) 10 MG tablet TAKE ONE TABLET BY MOUTH TWICE DAILY 180 tablet 2  . mirabegron ER (MYRBETRIQ) 50 MG TB24 tablet Take 1 tablet (50 mg total) by mouth daily. 90 tablet 1  . Multiple Vitamin (MULTIVITAMIN WITH MINERALS) TABS tablet Take 1 tablet by mouth daily.    [name]  omeprazole (PRILOSEC) 40 MG capsule Take 40 mg by mouth daily.    . ondansetron (ZOFRAN) 8 MG tablet TAKE ONE TABLET EVERY EIGHT HOURS AS NEEDED FOR NAUSEA / VOMITING 60 tablet 1  . polyethylene glycol (MIRALAX / GLYCOLAX) packet Take 17 g by mouth daily.    [name] triamcinolone (NASACORT) 55 MCG/ACT AERO nasal inhaler Place 2 sprays into the nose daily as needed (for congestion).     . venlafaxine XR (EFFEXOR-XR) 150 MG 24 hr capsule TAKE 1 CAPSULE BY MOUTH EVERY DAY 90 capsule 1  . XALKORI 250 MG capsule TAKE 1 CAPSULE (250 MG TOTAL) BY MOUTH 2 TIMES DAILY. 60 capsule 2  . benzonatate (TESSALON) 100 MG capsule Take 1 capsule (100 mg total) by mouth 3 (three) times daily as needed for cough. (Patient not taking: Reported on 11/02/2019) 60 capsule 0  . meloxicam (MOBIC) 7.5 MG tablet Take 7.5 mg by mouth daily.    . sucralfate (CARAFATE) 1 g tablet Take 1 tablet (1 g total) by mouth 2 (two) times daily. (Patient not taking: Reported on 11/02/2019) 60 tablet 0   No current facility-administered medications for this visit.     PHYSICAL EXAMINATION: ECOG PERFORMANCE STATUS: 2 - Symptomatic, <50% confined to bed Vitals:   11/30/19 1104  BP: 132/78  Pulse: (!) 47  Resp: 18  Temp: (!) 96.1 F (35.6 C)   Filed Weights   11/30/19 1104  Weight: 154 lb 8 oz (70.1 kg)    Physical Exam Constitutional:      General: She is not in acute distress.    Appearance: She is not diaphoretic.      Comments: Frail female, walks with a walker  HENT:     Head: Normocephalic and atraumatic.     Nose: Nose normal.     Mouth/Throat:     Pharynx: No oropharyngeal exudate.  Eyes:     General: No scleral icterus.    Pupils: Pupils are equal, round, and reactive to light.  Cardiovascular:     Rate and Rhythm: Regular rhythm. Bradycardia present.     Heart sounds: No murmur heard.   Pulmonary:     Effort: Pulmonary effort is normal. No respiratory distress.     Breath sounds: No wheezing or rales.  Chest:     Chest wall: No tenderness.  Abdominal:     General: There is no distension.     Palpations: Abdomen is soft.     Tenderness: There is no abdominal tenderness.  Musculoskeletal:        General: Normal range of motion.     Cervical back: Normal range of motion and neck supple.     Comments: Bilateral lower extremity 2+ edema  Skin:    General: Skin is warm and dry.     Findings: No erythema.  Neurological:     Mental Status: She is alert and oriented to person, place, and time.     Cranial Nerves: No cranial nerve deficit.     Motor: No abnormal muscle tone.     Coordination: Coordination normal.  Psychiatric:        Mood and Affect: Affect normal.      CMP Latest Ref Rng & Units 11/30/2019  Glucose 70 - 99 mg/dL 83  BUN 8 - 23 mg/dL 48(H)  Creatinine 0.44 - 1.00 mg/dL 1.47(H)  Sodium 135 - 145 mmol/L 135  Potassium 3.5 - 5.1 mmol/L 4.6  Chloride 98 - 111 mmol/L 97(L)  CO2 22 - 32  mmol/L 29  Calcium 8.9 - 10.3 mg/dL 7.1(L)  Total Protein 6.5 - 8.1 g/dL 6.4(L)  Total Bilirubin 0.3 - 1.2 mg/dL 0.5  Alkaline Phos 38 - 126 U/L 99  AST 15 - 41 U/L 32  ALT 0 - 44 U/L 41   CBC Latest Ref Rng & Units 11/30/2019  WBC 4.0 - 10.5 K/uL 6.7  Hemoglobin 12.0 - 15.0 g/dL 10.5(L)  Hematocrit 36 - 46 % 30.9(L)  Platelets 150 - 400 K/uL 300    LABORATORY DATA:  I have reviewed the data as listed Lab Results  Component Value Date   WBC 6.7 11/30/2019   HGB 10.5 (L)  11/30/2019   HCT 30.9 (L) 11/30/2019   MCV 96.3 11/30/2019   PLT 300 11/30/2019   Recent Labs    10/07/19 1348 11/02/19 1155 11/30/19 1023  NA 140 141 135  K 3.8 4.3 4.6  CL 107 108 97*  CO2 '28 25 29  ' GLUCOSE 87 115* 83  BUN 17 32* 48*  CREATININE 0.96 1.19* 1.47*  CALCIUM 7.6* 7.2* 7.1*  GFRNONAA 55* 43* 33*  GFRAA >60 50* 38*  PROT 6.5 5.7* 6.4*  ALBUMIN 3.2* 3.1* 3.5  AST 25 30 32  ALT 34 31 41  ALKPHOS 101 86 99  BILITOT 0.7 0.6 0.5   Iron/TIBC/Ferritin/ %Sat No results found for: IRON, TIBC, FERRITIN, IRONPCTSAT   RADIOGRAPHIC STUDIES: I have personally reviewed the radiological images as listed and agreed with the findings in the report. CT Abdomen Pelvis Wo Contrast  Result Date: 10/21/2019 CLINICAL DATA:  Lung cancer. Restaging. Also with a history of breast cancer. EXAM: CT CHEST, ABDOMEN AND PELVIS WITHOUT CONTRAST TECHNIQUE: Multidetector CT imaging of the chest, abdomen and pelvis was performed following the standard protocol without IV contrast. COMPARISON:  PET-CT 05/10/2019.  CT chest 03/23/2019. FINDINGS: CT CHEST FINDINGS Cardiovascular: The heart size is normal. No substantial pericardial effusion. Coronary artery calcification is evident. Atherosclerotic calcification is noted in the wall of the thoracic aorta. Mediastinum/Nodes: No mediastinal lymphadenopathy. No evidence for gross hilar lymphadenopathy although assessment is limited by the lack of intravenous contrast on today's study. The esophagus has normal imaging features. No right axillary lymphadenopathy. Upper normal 10 mm short axis left axillary node stable since 03/23/2019. Lungs/Pleura: Left upper lobe pulmonary lesion measures 2.1 x 1.7 cm today, unchanged from 2.1 x 1.8 cm on 03/23/2019. Similar appearance of volume loss in the left upper lobe. Stable tiny nodules anterior right upper lobe on 35/4. 5 mm anterior right lung nodule on 53/4 is unchanged. Calcified granulomata again noted left upper  lobe. Additional scattered tiny pulmonary nodules are stable. No new suspicious pulmonary nodule or mass. Peripheral airway impaction noted anterior left lower lobe (81/4) potentially related to atypical infection. No pleural effusion. Musculoskeletal: No worrisome lytic or sclerotic osseous abnormality. Multiple nonacute right-sided rib fractures noted. CT ABDOMEN PELVIS FINDINGS Hepatobiliary: No focal abnormality in the liver on this study without intravenous contrast. Gallbladder is surgically absent. No intrahepatic or extrahepatic biliary dilation. Pancreas: No focal mass lesion. No dilatation of the main duct. No intraparenchymal cyst. No peripancreatic edema. Spleen: No splenomegaly. No focal mass lesion. Adrenals/Urinary Tract: No adrenal nodule or mass. Unremarkable noncontrast appearance of the kidneys. No evidence for hydroureter. The urinary bladder appears normal for the degree of distention. Stomach/Bowel: Stomach is unremarkable. No gastric wall thickening. No evidence of outlet obstruction. Duodenum is normally positioned as is the ligament of Treitz. No small bowel wall thickening. No small bowel  dilatation. Large stool volume noted in the distal transverse and descending/sigmoid colon. Vascular/Lymphatic: There is abdominal aortic atherosclerosis without aneurysm. There is no gastrohepatic or hepatoduodenal ligament lymphadenopathy. No retroperitoneal or mesenteric lymphadenopathy. No pelvic sidewall lymphadenopathy. Reproductive: Unremarkable. Other: No intraperitoneal free fluid. Musculoskeletal: Old right pubic rami fractures noted. No worrisome lytic or sclerotic osseous abnormality. Similar appearance of the compression fracture at T11. Inferior endplate compression fracture at T9 is new in the interval. IMPRESSION: 1. Stable. No new or progressive findings. No change left upper lobe pulmonary lesion. 2. Scattered tiny bilateral pulmonary nodules, stable. 3. No evidence for metastatic disease  in the abdomen or pelvis. 4. Large stool volume in the distal transverse and descending/sigmoid colon. Imaging features could be compatible with constipation in the appropriate clinical setting. 5. Inferior endplate compression fracture at T9, new in the interval with some lucency at the fracture site. Pathologic fracture cannot be completely excluded. 6. Aortic Atherosclerosis (ICD10-I70.0). Electronically Signed   By: Misty Stanley M.D.   On: 10/21/2019 13:29   CT Head Wo Contrast  Result Date: 10/31/2019 CLINICAL DATA:  Status post fall. EXAM: CT HEAD WITHOUT CONTRAST TECHNIQUE: Contiguous axial images were obtained from the base of the skull through the vertex without intravenous contrast. COMPARISON:  None. FINDINGS: Brain: There is mild cerebral atrophy with widening of the extra-axial spaces and ventricular dilatation. There are areas of decreased attenuation within the white matter tracts of the supratentorial brain, consistent with microvascular disease changes. Vascular: No hyperdense vessel or unexpected calcification. Skull: Normal. Negative for fracture or focal lesion. Sinuses/Orbits: No acute finding. Other: Moderate severity scalp soft tissue swelling, with an associated scalp hematoma, is seen along the posterior aspect of the vertex on the left. A mild amount of scalp soft tissue air is also noted. IMPRESSION: 1. Moderate severity scalp soft tissue swelling, with an associated scalp hematoma, seen along the posterior aspect of the vertex on the left. 2. No acute intracranial abnormality. Electronically Signed   By: Virgina Norfolk M.D.   On: 10/31/2019 19:25   CT Chest Wo Contrast  Result Date: 10/21/2019 CLINICAL DATA:  Lung cancer. Restaging. Also with a history of breast cancer. EXAM: CT CHEST, ABDOMEN AND PELVIS WITHOUT CONTRAST TECHNIQUE: Multidetector CT imaging of the chest, abdomen and pelvis was performed following the standard protocol without IV contrast. COMPARISON:  PET-CT  05/10/2019.  CT chest 03/23/2019. FINDINGS: CT CHEST FINDINGS Cardiovascular: The heart size is normal. No substantial pericardial effusion. Coronary artery calcification is evident. Atherosclerotic calcification is noted in the wall of the thoracic aorta. Mediastinum/Nodes: No mediastinal lymphadenopathy. No evidence for gross hilar lymphadenopathy although assessment is limited by the lack of intravenous contrast on today's study. The esophagus has normal imaging features. No right axillary lymphadenopathy. Upper normal 10 mm short axis left axillary node stable since 03/23/2019. Lungs/Pleura: Left upper lobe pulmonary lesion measures 2.1 x 1.7 cm today, unchanged from 2.1 x 1.8 cm on 03/23/2019. Similar appearance of volume loss in the left upper lobe. Stable tiny nodules anterior right upper lobe on 35/4. 5 mm anterior right lung nodule on 53/4 is unchanged. Calcified granulomata again noted left upper lobe. Additional scattered tiny pulmonary nodules are stable. No new suspicious pulmonary nodule or mass. Peripheral airway impaction noted anterior left lower lobe (81/4) potentially related to atypical infection. No pleural effusion. Musculoskeletal: No worrisome lytic or sclerotic osseous abnormality. Multiple nonacute right-sided rib fractures noted. CT ABDOMEN PELVIS FINDINGS Hepatobiliary: No focal abnormality in the liver on this study  without intravenous contrast. Gallbladder is surgically absent. No intrahepatic or extrahepatic biliary dilation. Pancreas: No focal mass lesion. No dilatation of the main duct. No intraparenchymal cyst. No peripancreatic edema. Spleen: No splenomegaly. No focal mass lesion. Adrenals/Urinary Tract: No adrenal nodule or mass. Unremarkable noncontrast appearance of the kidneys. No evidence for hydroureter. The urinary bladder appears normal for the degree of distention. Stomach/Bowel: Stomach is unremarkable. No gastric wall thickening. No evidence of outlet obstruction.  Duodenum is normally positioned as is the ligament of Treitz. No small bowel wall thickening. No small bowel dilatation. Large stool volume noted in the distal transverse and descending/sigmoid colon. Vascular/Lymphatic: There is abdominal aortic atherosclerosis without aneurysm. There is no gastrohepatic or hepatoduodenal ligament lymphadenopathy. No retroperitoneal or mesenteric lymphadenopathy. No pelvic sidewall lymphadenopathy. Reproductive: Unremarkable. Other: No intraperitoneal free fluid. Musculoskeletal: Old right pubic rami fractures noted. No worrisome lytic or sclerotic osseous abnormality. Similar appearance of the compression fracture at T11. Inferior endplate compression fracture at T9 is new in the interval. IMPRESSION: 1. Stable. No new or progressive findings. No change left upper lobe pulmonary lesion. 2. Scattered tiny bilateral pulmonary nodules, stable. 3. No evidence for metastatic disease in the abdomen or pelvis. 4. Large stool volume in the distal transverse and descending/sigmoid colon. Imaging features could be compatible with constipation in the appropriate clinical setting. 5. Inferior endplate compression fracture at T9, new in the interval with some lucency at the fracture site. Pathologic fracture cannot be completely excluded. 6. Aortic Atherosclerosis (ICD10-I70.0). Electronically Signed   By: Misty Stanley M.D.   On: 10/21/2019 13:29   CT Cervical Spine Wo Contrast  Result Date: 10/31/2019 CLINICAL DATA:  Status post fall. EXAM: CT CERVICAL SPINE WITHOUT CONTRAST TECHNIQUE: Multidetector CT imaging of the cervical spine was performed without intravenous contrast. Multiplanar CT image reconstructions were also generated. COMPARISON:  None. FINDINGS: Alignment: There is approximately 2 mm anterolisthesis of the C3 on C4 vertebral body. Skull base and vertebrae: No acute fracture. No primary bone lesion or focal pathologic process. Soft tissues and spinal canal: No prevertebral  fluid or swelling. No visible canal hematoma. Disc levels: Moderate severity endplate sclerosis is seen at the levels of C4-C5, C5-C6, C6-C7 and C7-T1. Moderate to marked severity intervertebral disc space narrowing is also seen at these levels. Moderate severity bilateral multilevel facet joint hypertrophy is seen. Upper chest: A 2.4 cm x 1.7 cm x 1.9 cm low-attenuation lung mass is seen within the anteromedial aspect of the left apex. A chronic posterior third right rib fracture is seen. Other: None. IMPRESSION: 1. No acute fracture within the cervical spine. 2. Moderate severity multilevel degenerative disc disease and facet joint hypertrophy. 3. 2.4 cm x 1.7 cm x 1.9 cm low-attenuation lung mass is seen within the anteromedial aspect of the left apex. This finding is concerning for the presence of a primary lung malignancy. Further evaluation with a nonemergent chest CT is recommended. Electronically Signed   By: Virgina Norfolk M.D.   On: 10/31/2019 19:29   MR Brain W Wo Contrast  Result Date: 10/23/2019 CLINICAL DATA:  Breast and lung cancer follow-up EXAM: MRI HEAD WITHOUT AND WITH CONTRAST TECHNIQUE: Multiplanar, multiecho pulse sequences of the brain and surrounding structures were obtained without and with intravenous contrast. CONTRAST:  40m GADAVIST GADOBUTROL 1 MMOL/ML IV SOLN COMPARISON:  None. FINDINGS: Brain: No acute infarct, acute hemorrhage or extra-axial collection. Multifocal white matter hyperintensity, most commonly due to chronic ischemic microangiopathy. There is generalized atrophy without lobar predilection. No chronic  microhemorrhage. Normal midline structures. There is no abnormal contrast enhancement. Vascular: Normal flow voids. Skull and upper cervical spine: Normal marrow signal. Sinuses/Orbits: Negative. Other: None. IMPRESSION: 1. No intracranial metastatic disease. 2. Generalized atrophy and chronic ischemic microangiopathy. Electronically Signed   By: Ulyses Jarred M.D.    On: 10/23/2019 04:02   DG HIP UNILAT WITH PELVIS 2-3 VIEWS LEFT  Result Date: 09/21/2019 CLINICAL DATA:  Left hip pain, history of pubic ramus fracture EXAM: DG HIP (WITH OR WITHOUT PELVIS) 2-3V LEFT COMPARISON:  09/08/2018 FINDINGS: Pelvic ring is intact. Healed superior and inferior pubic rami fractures on the right are noted. Degenerative changes of lumbar spine are seen. No acute fracture or dislocation is noted. No soft tissue changes are noted. IMPRESSION: Healed pubic rami fractures on the right. No acute abnormality noted. Electronically Signed   By: Inez Catalina M.D.   On: 09/21/2019 21:37      ASSESSMENT & PLAN:  1. Hypocalcemia   2. Bradycardia   3. Encounter for antineoplastic chemotherapy   4. Malignant neoplasm of left female breast, unspecified estrogen receptor status, unspecified site of breast ([place])   5. Primary adenocarcinoma of left lung ([place])   6. Osteopenia, unspecified location   Cancer Staging Malignant neoplasm of left female breast [facility]) Staging form: Breast, AJCC 8th Edition - Clinical stage from 12/17/2018: Stage IB (cT1b, cN1, cM0, G1, ER+, PR+, HER2-) - Signed by Earlie Server, MD on 12/17/2018  Primary adenocarcinoma of lung [facility]) Staging form: Lung, AJCC 8th Edition - Clinical stage from 09/28/2018: Stage IVA (cT2, cN3, cM1a) - Signed by Earlie Server, MD on 09/28/2018   Patient has 2 primaries.   Stage IV lung adenocarcinoma, met fusion mutation cT2 N3 M1a PDL 1 TPS more than 70%  Breast Cancer,  Her disease has been stable, last surveillance scan was in May.  Plan repeat in August. Continue crizotinib and Arimidex Labs reviewed and discussed with patient.  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 Patient has been advised to increase oral calcium supplementation dose. With further questioning, patient admits that she has not been compliant with taking calcium tablets.  She has been off for about a week and just restarted yesterday. Denies any  numbness tingling.  Occasionally she feels lightheaded. We checked her EKG which showed worsening of bradycardia which may explain her lightheaded symptoms. I advised patient to go to emergency room for further evaluation, calcium gluconate infusion and further evaluation of bradycardia.  Suspect bradycardia may be secondary to hypocalcemia induced sinus node slowing. Patient agrees with the plan.  I called the ER and talked to triage nurse   #Osteopenia, stage IV lung cancer.. Patient has previously been on [place].  Vitamin D levels are being checked.  Results are pending. Continue to hold Xgeva due to severe hypocalcemia.  Follow-up plan to be determined. All questions were answered. The patient knows to call the clinic with any problems questions or concerns. Return of visit: keep her appointment.   Earlie Server, MD, PhD 11/30/2019

## 2019-11-30 NOTE — ED Notes (Addendum)
Pt c/o L hip issues, constipation, and low calcium level from regular appointment today. States her bursitis is acting up in her hip. Pt assisted to bedside toilet to urinate. EDP [place] pleased with first EKG; denies need for repeat EKG. Pt states her nausea is baseline from chemo. Denies major fatigue.

## 2019-11-30 NOTE — ED Notes (Signed)
Pt provided gingerale and graham crackers per request.

## 2019-11-30 NOTE — ED Notes (Signed)
Hospitalist at bedside 

## 2019-11-30 NOTE — ED Triage Notes (Signed)
First Nurse Note:  Arrives from [facility] for ED evaluation of hypocalcemia.  Per report patient's CA:  7.1.  Patient had not been taking calcium at home, restarted calcium yesterday.  EKG at cancer cetner and patient HR was 47, normally 52.  Patient arrives to ED AAOx3.  Skin warm and dry.  NAD

## 2019-12-01 ENCOUNTER — Other Ambulatory Visit: Payer: Self-pay

## 2019-12-01 ENCOUNTER — Ambulatory Visit: Payer: Medicare Other | Admitting: Family Medicine

## 2019-12-01 ENCOUNTER — Encounter: Payer: Self-pay | Admitting: Internal Medicine

## 2019-12-01 LAB — BASIC METABOLIC PANEL
Anion gap: 5 (ref 5–15)
Anion gap: 7 (ref 5–15)
BUN: 33 mg/dL — ABNORMAL HIGH (ref 8–23)
BUN: 33 mg/dL — ABNORMAL HIGH (ref 8–23)
CO2: 30 mmol/L (ref 22–32)
CO2: 27 mmol/L (ref 22–32)
Calcium: 7 mg/dL — ABNORMAL LOW (ref 8.9–10.3)
Calcium: 7.2 mg/dL — ABNORMAL LOW (ref 8.9–10.3)
Chloride: 99 mmol/L (ref 98–111)
Chloride: 100 mmol/L (ref 98–111)
Creatinine, Ser: 1.2 mg/dL — ABNORMAL HIGH (ref 0.44–1.00)
Creatinine, Ser: 1.23 mg/dL — ABNORMAL HIGH (ref 0.44–1.00)
GFR calc Af Amer: 49 mL/min — ABNORMAL LOW (ref >60)
GFR calc Af Amer: 48 mL/min — ABNORMAL LOW (ref >60)
GFR calc non Af Amer: 42 mL/min — ABNORMAL LOW (ref >60)
GFR calc non Af Amer: 41 mL/min — ABNORMAL LOW (ref >60)
Glucose, Bld: 98 mg/dL (ref 70–99)
Glucose, Bld: 109 mg/dL — ABNORMAL HIGH (ref 70–99)
Potassium: 4.2 mmol/L (ref 3.5–5.1)
Potassium: 4.2 mmol/L (ref 3.5–5.1)
Sodium: 134 mmol/L — ABNORMAL LOW (ref 135–145)
Sodium: 134 mmol/L — ABNORMAL LOW (ref 135–145)

## 2019-12-01 LAB — CBC
HCT: 27.2 % — ABNORMAL LOW (ref 36.0–46.0)
Hemoglobin: 10.1 g/dL — ABNORMAL LOW (ref 12.0–15.0)
MCH: 36.3 pg — ABNORMAL HIGH (ref 26.0–34.0)
MCHC: 37.1 g/dL — ABNORMAL HIGH (ref 30.0–36.0)
MCV: 97.8 fL (ref 80.0–100.0)
Platelets: 310 10*3/uL (ref 150–400)
RBC: 2.78 MIL/uL — ABNORMAL LOW (ref 3.87–5.11)
RDW: 13.8 % (ref 11.5–15.5)
WBC: 5 10*3/uL (ref 4.0–10.5)
nRBC: 0 % (ref 0.0–0.2)

## 2019-12-01 LAB — CALCIUM, IONIZED: Calcium, Ionized, Serum: 3.9 mg/dL — ABNORMAL LOW (ref 4.5–5.6)

## 2019-12-01 LAB — PARATHYROID HORMONE, INTACT (NO CA): PTH: 63 pg/mL (ref 15–65)

## 2019-12-01 MED ORDER — VENLAFAXINE HCL ER 150 MG PO CP24: ORAL_CAPSULE | ORAL | 1 refills | Status: DC

## 2019-12-01 MED ORDER — POLYETHYLENE GLYCOL 3350 17 G PO PACK
17.0000 g | PACK | Freq: Every day | ORAL | Status: DC
  Administered 2019-12-01: 10:00:00 17 g via ORAL
  Filled 2019-12-01: qty 1

## 2019-12-01 MED ORDER — CALCIUM GLUCONATE-NACL 1-0.675 GM/50ML-% IV SOLN
1.0000 g | Freq: Once | INTRAVENOUS | Status: AC
  Administered 2019-12-01: 1000 mg via INTRAVENOUS
  Filled 2019-12-01: qty 50

## 2019-12-01 NOTE — ED Notes (Signed)
Up to bsc without assist.  She says pain in her left hip only if she moves or stands.  Says she had appt to have cortisone injection today.

## 2019-12-01 NOTE — Discharge Summary (Signed)
Physician Discharge Summary  Jamie Matthews [id number]:[id number] DOB: 12-Dec-1940   PCP: [name], MD  Admit date: 11/30/2019 Discharge date: 12/01/2019 Length of Stay: 0 days   Code Status: Full Code  Admitted From:  Home Discharged to:   [place]: PT (has PT at her facility)  Discharge Condition:  Stable  Recommendations for Outpatient Follow-up   1. Follow up with PCP in 1 week with BMP 2. Outpatient eval/treatment for left hip pain 3. Follow up with oncology 4. Ensure patient is taking her Calcium supplement  Hospital Summary   This is a very pleasant 79 year old female with a history of CKD 3a, breast cancer and metastatic lung cancer as well as IBS and left hip pain which is being evaluated by her PCP currently and has outpatient follow up for this who presented to the ED with abnormal labs after visiting her Oncologist, Dr. Tasia Catchings. She has been complaining of feeling generalized weakness for the past several days-week and was noted to be bradycardic and hypocalcemic at her oncologist's office and advised to be evaluated in the ED. Noted to have not been taking her calcium supplement at home for the past week as she felt this was causing constipation. Denies chest pain, palpitations, nausea, vomiting, falls, focal weakness. Admits to constipation and left hip pain without radiation down leg.  ED Course: Notable Labs - Na 133, BUN/Cr 44/1.42, Ca 6.9 (7.3 when corrected for albumin), Mg 3.8, Hb 10.4, COVID 19 pending. Given 1g Ca gluconate and 1L LR bolus  Left hip xray unremarkable. Improvement in calcium with calcium gluconate and improved symptoms. Advised to continue PO supplement and have outpatient follow up.  A & P   Active Problems:   Hypocalcemia    1. Generalized weakness secondary to Hypocalcemia from not taking her PO supplement a. Improved with calcium gluconate b. PT recommending HHPT, patient gets PT as an outpatient already c. Continue taking  home Calcium supplement d. BMP next week with PCP follow up  2. Left hip pain arthritic vs. bursitis a. Being evaluated by her PCP  b. Xray unremarkable c. Hold NSAIDs due to renal function d. Tylenol PRN e. Outpatient follow up  3. History of Breast Cancer and Metastatic lung cancer a. Follows with oncology outpatient b. Continue home meds   4. Elevated creatinine from baseline in setting of CKD 3a a. improved after IV fluids b. Hold NSAIDs  5. Asymptomatic Bradycardia a. Stable b. Continue outpatient calcium supplement c. Hold beta blockers  6. IBS with constipation a. Home regimen     Consultants  . none  Procedures  . none  Antibiotics   Anti-infectives (From admission, onward)   None       Subjective  Patient seen and examined at bedside no acute distress and resting comfortably.  No events overnight.  Tolerating diet. Did not sleep well overnight or the night before and feels tired today.  Denies any chest pain, shortness of breath, fever, nausea, vomiting, urinary or bowel complaints. Otherwise ROS negative    Objective   Discharge Exam: Vitals:   12/01/19 0842 12/01/19 1152  BP: (!) 121/59 102/66  Pulse: (!) 51 (!) 54  Resp: 16 15  Temp: 98.8 F (37.1 C) 98.4 F (36.9 C)  SpO2: 100% 95%   Vitals:   12/01/19 0757 12/01/19 0808 12/01/19 0842 12/01/19 1152  BP:  126/64 (!) 121/59 102/66  Pulse: 65  (!) 51 (!) 54  Resp: 17  16  15  Temp: 98.6 F (37 C)  98.8 F (37.1 C) 98.4 F (36.9 C)  TempSrc: Oral  Oral Oral  SpO2: 95%  100% 95%  Weight:      Height:        Physical Exam Vitals and nursing note reviewed.  Constitutional:      Appearance: Normal appearance.     Comments: Appears tired but responds appropriately to questioning  HENT:     Head: Normocephalic and atraumatic.  Eyes:     Conjunctiva/sclera: Conjunctivae normal.  Cardiovascular:     Rate and Rhythm: Normal rate and regular rhythm.  Pulmonary:     Effort:  Pulmonary effort is normal.     Breath sounds: Normal breath sounds.  Abdominal:     General: Abdomen is flat.     Palpations: Abdomen is soft.  Musculoskeletal:        General: No swelling or tenderness.  Skin:    Coloration: Skin is not jaundiced or pale.  Neurological:     Mental Status: She is alert. Mental status is at baseline.  Psychiatric:        Mood and Affect: Mood normal.        Behavior: Behavior normal.       The results of significant diagnostics from this hospitalization (including imaging, microbiology, ancillary and laboratory) are listed below for reference.     Microbiology: Recent Results (from the past 240 hour(s))  SARS CORONAVIRUS 2 (TAT 6-24 HRS)     Status: None   Collection Time: 11/30/19  3:52 PM  Result Value Ref Range Status   SARS Coronavirus 2 NEGATIVE NEGATIVE Final    Comment: (NOTE) SARS-CoV-2 target nucleic acids are NOT DETECTED.  The SARS-CoV-2 RNA is generally detectable in upper and lower respiratory specimens during the acute phase of infection. Negative results do not preclude SARS-CoV-2 infection, do not rule out co-infections with other pathogens, and should not be used as the sole basis for treatment or other patient management decisions. Negative results must be combined with clinical observations, patient history, and epidemiological information. The expected result is Negative.  Fact Sheet for Patients: SugarRoll.be  Fact Sheet for Healthcare Providers: https://www.woods-mathews.com/  This test is not yet approved or cleared by the [place] FDA and  has been authorized for detection and/or diagnosis of SARS-CoV-2 by FDA under an Emergency Use Authorization (EUA). This EUA will remain  in effect (meaning this test can be used) for the duration of the COVID-19 declaration under Se ction 564(b)(1) of the Act, 21 U.S.C. section 360bbb-3(b)(1), unless the authorization is  terminated or revoked sooner.  Performed at [facility], [place] [address], [place], [place] [address]      Labs: BNP (last 3 results) No results for input(s): BNP in the last 8760 hours. Basic Metabolic Panel: Recent Labs  Lab 11/30/19 1023 11/30/19 1142 11/30/19 1426 11/30/19 1817 12/01/19 0626 12/01/19 1316  NA 135  --  133* 134* 134* 134*  K 4.6  --  3.9 3.8 4.2 4.2  CL 97*  --  95* 98 99 100  CO2 29  --  27 28 30 27   GLUCOSE 83  --  115* 113* 98 109*  BUN 48*  --  44* 40* 33* 33*  CREATININE 1.47*  --  1.42* 1.31* 1.20* 1.23*  CALCIUM 7.1*  --  6.9* 6.9* 7.0* 7.2*  MG  --  3.8*  --   --   --   --  Liver Function Tests: Recent Labs  Lab 11/30/19 1023 11/30/19 1426  AST 32 33  ALT 41 41  ALKPHOS 99 96  BILITOT 0.5 0.6  PROT 6.4* 6.5  ALBUMIN 3.5 3.5   No results for input(s): LIPASE, AMYLASE in the last 168 hours. No results for input(s): AMMONIA in the last 168 hours. CBC: Recent Labs  Lab 11/30/19 1023 11/30/19 1426 12/01/19 0626  WBC 6.7 6.3 5.0  NEUTROABS 3.2 2.8  --   HGB 10.5* 10.4* 10.1*  HCT 30.9* 31.4* 27.2*  MCV 96.3 96.3 97.8  PLT 300 321 310   Cardiac Enzymes: No results for input(s): CKTOTAL, CKMB, CKMBINDEX, TROPONINI in the last 168 hours. BNP: Invalid input(s): POCBNP CBG: No results for input(s): GLUCAP in the last 168 hours. D-Dimer No results for input(s): DDIMER in the last 72 hours. Hgb A1c No results for input(s): HGBA1C in the last 72 hours. Lipid Profile No results for input(s): CHOL, HDL, LDLCALC, TRIG, CHOLHDL, LDLDIRECT in the last 72 hours. Thyroid function studies No results for input(s): TSH, T4TOTAL, T3FREE, THYROIDAB in the last 72 hours.  Invalid input(s): FREET3 Anemia work up No results for input(s): VITAMINB12, FOLATE, FERRITIN, TIBC, IRON, RETICCTPCT in the last 72 hours. Urinalysis    Component Value Date/Time   COLORURINE YELLOW (A) 10/10/2018 1548   APPEARANCEUR Clear 09/23/2019 1103    LABSPEC 1.006 10/10/2018 1548   LABSPEC 1.027 06/10/2014 1759   PHURINE 6.0 10/10/2018 1548   GLUCOSEU Negative 09/23/2019 1103   GLUCOSEU Negative 06/10/2014 1759   HGBUR NEGATIVE 10/10/2018 1548   BILIRUBINUR Negative 09/23/2019 1103   BILIRUBINUR Negative 06/10/2014 1759   KETONESUR NEGATIVE 10/10/2018 1548   PROTEINUR Negative 09/23/2019 1103   PROTEINUR NEGATIVE 10/10/2018 1548   UROBILINOGEN 0.2 08/20/2017 1134   NITRITE Negative 09/23/2019 1103   NITRITE NEGATIVE 10/10/2018 1548   LEUKOCYTESUR Negative 09/23/2019 1103   LEUKOCYTESUR NEGATIVE 10/10/2018 1548   LEUKOCYTESUR Trace 06/10/2014 1759   Sepsis Labs Invalid input(s): PROCALCITONIN,  WBC,  LACTICIDVEN Microbiology Recent Results (from the past 240 hour(s))  SARS CORONAVIRUS 2 (TAT 6-24 HRS)     Status: None   Collection Time: 11/30/19  3:52 PM  Result Value Ref Range Status   SARS Coronavirus 2 NEGATIVE NEGATIVE Final    Comment: (NOTE) SARS-CoV-2 target nucleic acids are NOT DETECTED.  The SARS-CoV-2 RNA is generally detectable in upper and lower respiratory specimens during the acute phase of infection. Negative results do not preclude SARS-CoV-2 infection, do not rule out co-infections with other pathogens, and should not be used as the sole basis for treatment or other patient management decisions. Negative results must be combined with clinical observations, patient history, and epidemiological information. The expected result is Negative.  Fact Sheet for Patients: SugarRoll.be  Fact Sheet for Healthcare Providers: https://www.woods-mathews.com/  This test is not yet approved or cleared by the [place] FDA and  has been authorized for detection and/or diagnosis of SARS-CoV-2 by FDA under an Emergency Use Authorization (EUA). This EUA will remain  in effect (meaning this test can be used) for the duration of the COVID-19 declaration under Se ction 564(b)(1)  of the Act, 21 U.S.C. section 360bbb-3(b)(1), unless the authorization is terminated or revoked sooner.  Performed at [facility], [place] [address], [place], [place] [address]     Discharge Instructions     Discharge Instructions    Diet - low sodium heart healthy   Complete by: As directed    Discharge instructions  Complete by: As directed    - Continue taking your calcium supplement as previously prescribed and do not stop taking this medication unless you discuss with your physician - stop taking meloxicam as this can harm your kidneys - take Tylenol if you are having pain - follow up with your primary care physician regarding your hip pain and get lab work prior to your next visit - follow up with your oncologist If you have any significant change or worsening of your symptoms, do not hesitate to contact your primary care physician or return to the ED.   Increase activity slowly   Complete by: As directed      Allergies as of 12/01/2019      Reactions   Sulfa Antibiotics Rash      Medication List    STOP taking these medications   benzonatate 100 MG capsule Commonly known as: TESSALON   Dexilant 30 MG capsule Generic drug: Dexlansoprazole   meloxicam 7.5 MG tablet Commonly known as: MOBIC     TAKE these medications   acetaminophen 325 MG tablet Commonly known as: TYLENOL Take 325 mg by mouth every 6 (six) hours as needed for mild pain.   anastrozole 1 MG tablet Commonly known as: ARIMIDEX TAKE 1 TABLET BY MOUTH DAILY   atorvastatin 20 MG tablet Commonly known as: LIPITOR Take 1 tablet (20 mg total) by mouth at bedtime.   buPROPion 150 MG 24 hr tablet Commonly known as: WELLBUTRIN XL TAKE ONE TABLET BY MOUTH EVERY DAY   CALCIUM 1200+D3 PO Take 1 tablet by mouth daily.   desonide 0.05 % lotion Commonly known as: DESOWEN Apply 1 application topically as needed (after showering).   fluticasone 50 MCG/ACT nasal spray Commonly known as:  FLONASE Place 2 sprays into both nostrils daily.   ketoconazole 2 % shampoo Commonly known as: NIZORAL Apply 1 application topically. 3 times a week   levothyroxine 25 MCG tablet Commonly known as: SYNTHROID Take 1 tablet (25 mcg total) by mouth daily.   lubiprostone 24 MCG capsule Commonly known as: Amitiza TAKE ONE CAPSULE TWICE A DAY WITH MEALS What changed:   how much to take  how to take this  when to take this   memantine 10 MG tablet Commonly known as: NAMENDA TAKE ONE TABLET BY MOUTH TWICE DAILY   mirabegron ER 50 MG Tb24 tablet Commonly known as: Myrbetriq Take 1 tablet (50 mg total) by mouth daily.   Mucinex DM 30-600 MG Tb12 Take 1 tablet by mouth 2 (two) times daily as needed (for congestion/cough).   multivitamin with minerals Tabs tablet Take 1 tablet by mouth daily.   omeprazole 40 MG capsule Commonly known as: PRILOSEC Take 40 mg by mouth daily.   ondansetron 8 MG tablet Commonly known as: ZOFRAN TAKE ONE TABLET EVERY EIGHT HOURS AS NEEDED FOR NAUSEA / VOMITING What changed: See the new instructions.   polyethylene glycol 17 g packet Commonly known as: MIRALAX / GLYCOLAX Take 17 g by mouth daily.   sucralfate 1 g tablet Commonly known as: Carafate Take 1 tablet (1 g total) by mouth 2 (two) times daily.   triamcinolone 55 MCG/ACT Aero nasal inhaler Commonly known as: NASACORT Place 2 sprays into the nose daily as needed (for congestion).   venlafaxine XR 150 MG 24 hr capsule Commonly known as: EFFEXOR-XR TAKE 1 CAPSULE BY MOUTH EVERY DAY What changed:   how much to take  how to take this  when to take this  additional  instructions   Xalkori 250 MG capsule Generic drug: crizotinib TAKE 1 CAPSULE (250 MG TOTAL) BY MOUTH 2 TIMES DAILY.       Allergies  Allergen Reactions  . Sulfa Antibiotics Rash     Dispo: The patient is from: Home              Anticipated d/c is to: Home              Anticipated d/c date is:  today               Patient currently is medically stable to d/c.       Time coordinating discharge: Over 30 minutes   SIGNED:   Harold Hedge, D.O. [facility] Pager: [phone]  12/01/2019, 2:08 PM

## 2019-12-01 NOTE — Care Management Obs Status (Signed)
[place] NOTIFICATION   Patient Details  Name: Virgene Tirone MRN: [id number] Date of Birth: 08/03/1940   Medicare Observation Status Notification Given:  Yes    Shelbie Hutching, RN 12/01/2019, 9:22 AM

## 2019-12-01 NOTE — Progress Notes (Signed)
Error

## 2019-12-01 NOTE — TOC Initial Note (Signed)
Transition of Care [facility]) - Initial/Assessment Note    Patient Details  Name: Jamie Matthews MRN: [id number] Date of Birth: [birth year]  [facility]) CM/SW Contact:    Shelbie Hutching, RN Phone Number: 12/01/2019, 9:26 AM  Clinical Narrative:                 Patient is placed under observation for hypocalcemia.  Patient would like to know when she can go home.  Patient is from [facility] independent living, she lives with her husband.  Patient has no discharge needs.  [facility] transport will pick her up at discharge.   Expected Discharge Plan: Home/Self Care Barriers to Discharge: Continued Medical Work up   Patient Goals and CMS Choice Patient states their goals for this hospitalization and ongoing recovery are:: Ready to go back home      Expected Discharge Plan and Services Expected Discharge Plan: Home/Self Care   Discharge Planning Services: CM Consult   Living arrangements for the past 2 months: [place]                                      Prior Living Arrangements/Services Living arrangements for the past 2 months: [place] Lives with:: Spouse Patient language and need for interpreter reviewed:: Yes Do you feel safe going back to the place where you live?: Yes      Need for Family Participation in Patient Care: Yes (Comment) Care giver support system in place?: Yes (comment) (husband)   Criminal Activity/Legal Involvement Pertinent to Current Situation/Hospitalization: No - Comment as needed  Activities of Daily Living Home Assistive Devices/Equipment: Environmental consultant (specify type) ADL Screening (condition at time of admission) Patient's cognitive ability adequate to safely complete daily activities?: Yes Is the patient deaf or have difficulty hearing?: No Does the patient have difficulty seeing, even when wearing glasses/contacts?: No Does the patient have difficulty concentrating, remembering, or  making decisions?: No Patient able to express need for assistance with ADLs?: Yes Does the patient have difficulty dressing or bathing?: No Independently performs ADLs?: Yes (appropriate for developmental age) Does the patient have difficulty walking or climbing stairs?: No Weakness of Legs: Both Weakness of Arms/Hands: None  Permission Sought/Granted Permission sought to share information with : Case Manager, Family Supports Permission granted to share information with : Yes, Verbal Permission Granted        Permission granted to share info w Relationship: husband     Emotional Assessment Appearance:: Appears stated age Attitude/Demeanor/Rapport: Engaged Affect (typically observed): Accepting Orientation: : Oriented to Self, Oriented to Place, Oriented to  Time, Oriented to Situation Alcohol / Substance Use: Not Applicable Psych Involvement: No (comment)  Admission diagnosis:  Hypocalcemia [E83.51] Hypermagnesemia [E83.41] Pain [R52] Patient Active Problem List   Diagnosis Date Noted  . Hypocalcemia 11/02/2019  . Trochanteric bursitis 09/20/2019  . Pruritus 09/06/2019  . Overactive bladder 09/06/2019  . Chronic pain of right knee 09/06/2019  . Multiple rib fractures 07/15/2019  . Fall as cause of accidental injury at home as place of occurrence 07/15/2019  . Encounter for antineoplastic chemotherapy 12/17/2018  . Lower leg edema 12/17/2018  . Aromatase inhibitor use 10/24/2018  . Osteopenia 10/24/2018  . Fever 10/10/2018  . Closed displaced fracture of pubis ([place]) 09/20/2018  . Localized swelling of right lower leg 09/20/2018  . AKI (acute kidney injury) ([place]) 08/17/2018  . Myofascial pain  07/28/2018  . Primary adenocarcinoma of lung ([place]) 07/21/2018  . Malignant neoplasm of left female breast ([place]) 07/21/2018  . Adenopathy   . Goals of care, counseling/discussion 06/26/2018  . IBS (irritable bowel syndrome) 05/25/2018  . BPV (benign positional vertigo) 07/30/2017   . Unilateral primary osteoarthritis, left knee 06/24/2017  . Bunion of great toe of right foot 06/09/2017  . Dizziness 03/24/2017  . Decreased appetite 12/03/2016  . Fatigue 09/22/2016  . Insomnia 09/22/2016  . Depression, recurrent ([place]) 09/22/2016  . Chronic cough 08/21/2016  . Primary localized osteoarthrosis, hand 03/19/2016  . OA (osteoarthritis) of knee 03/17/2016  . Stress due to illness of family member 10/10/2014  . Ileitis 06/22/2014  . Chronic insomnia 09/20/2013  . DNR (do not resuscitate) 11/15/2012  . Chronic constipation 04/01/2012  . 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 Urinary incontinence   . Major depressive disorder, recurrent, moderate   . Gastroesophageal reflux disease   . Hyperlipidemia    PCP:  [name], MD Pharmacy:   [place], [place] - [place] [address] Phone: [phone] Fax: [phone]  [place], [place] - [place] [address] Phone: [phone] Fax: [phone]     Social Determinants of Health (SDOH) Interventions    Readmission Risk Interventions Readmission Risk Prevention Plan 10/10/2018  Post Dischage Appt Complete  Medication Screening Complete  Transportation Screening Complete  Some recent data might be hidden

## 2019-12-01 NOTE — Evaluation (Signed)
Physical Therapy Evaluation Patient Details Name: Jamie Matthews MRN: [id number] DOB: 05-28-1940 Today's Date: 12/01/2019   History of Present Illness  79 year old female with a history of CKD 3a, breast cancer and metastatic lung cancer as well as IBS and left hip pain which is being evaluated by her PCP and found to have low calcium.  Apparently she was scheduled for cortisone shot in L hip today (7/8) with acute on chronic flare.  Clinical Impression  Pt showed good effort with PT exam and was able to move relatively confidently in bed but was clearly favoring L hip with standing acts, and especially with weight acceptance during ambulation.  She showed good effort but was limping and reliant on the walker during labored but safe ambulation bout.  She did have some veering to the L and mild fatigue but ultimately will be safe to return home with husband once medically cleared.    Follow Up Recommendations Home health PT    Equipment Recommendations  None recommended by PT (pt reports her walker got lost in ED, may need FWW if so)    Recommendations for Other Services       Precautions / Restrictions Restrictions Weight Bearing Restrictions: No      Mobility  Bed Mobility Overal bed mobility: Independent                Transfers Overall transfer level: Independent Equipment used: Rolling walker (2 wheeled)             General transfer comment: Pt able to rise to standing w/o direct assit  Ambulation/Gait Ambulation/Gait assistance: Supervision Gait Distance (Feet): 35 Feet Assistive device: Rolling walker (2 wheeled)       General Gait Details: Pt more reliant on the walker than she expected, had veering to the L, heavy UE use with L WBing.  No LOBs or overt safety issues but certainly not at her stated baseline.  Stairs            Wheelchair Mobility    Modified Rankin (Stroke Patients Only)       Balance Overall balance  assessment: Modified Independent (reliant on walker during standing)                                           Pertinent Vitals/Pain Pain Assessment: 0-10 Pain Score: 7  Pain Location: L hip    Home Living Family/patient expects to be discharged to:: Private residence Living Arrangements: Spouse/significant other Available Help at Discharge: Family Type of Home:  (home at [facility]) Home Access: Stairs to enter (1 small 1/2 step)     Home Layout: One level Home Equipment: Environmental consultant - 2 wheels;Cane - single point      Prior Function Level of Independence: Independent               Hand Dominance        Extremity/Trunk Assessment   Upper Extremity Assessment Upper Extremity Assessment: Overall WFL for tasks assessed;Generalized weakness    Lower Extremity Assessment Lower Extremity Assessment: Overall WFL for tasks assessed;Generalized weakness (functional AROM in L hip, pain with resist)       Communication   Communication: No difficulties  Cognition Arousal/Alertness: Awake/alert Behavior During Therapy: WFL for tasks assessed/performed Overall Cognitive Status: Within Functional Limits for tasks assessed  General Comments      Exercises     Assessment/Plan    PT Assessment Patient needs continued PT services  PT Problem List Decreased strength;Decreased range of motion;Decreased activity tolerance;Decreased balance;Decreased mobility;Decreased safety awareness;Decreased knowledge of use of DME;Pain       PT Treatment Interventions DME instruction;Gait training;Stair training;Functional mobility training;Therapeutic activities;Therapeutic exercise;Balance training;Neuromuscular re-education;Patient/family education    PT Goals (Current goals can be found in the Care Plan section)  Acute Rehab PT Goals Patient Stated Goal: go home and get L hip cortisone shot PT Goal  Formulation: With patient Time For Goal Achievement: 12/15/19 Potential to Achieve Goals: Good    Frequency Min 2X/week   Barriers to discharge        Co-evaluation               AM-PAC PT "6 Clicks" Mobility  Outcome Measure Help needed turning from your back to your side while in a flat bed without using bedrails?: None Help needed moving from lying on your back to sitting on the side of a flat bed without using bedrails?: None Help needed moving to and from a bed to a chair (including a wheelchair)?: None Help needed standing up from a chair using your arms (e.g., wheelchair or bedside chair)?: None Help needed to walk in hospital room?: A Little Help needed climbing 3-5 steps with a railing? : A Little 6 Click Score: 22    End of Session Equipment Utilized During Treatment: Gait belt Activity Tolerance: Patient limited by pain Patient left: with bed alarm set;with call bell/phone within reach Nurse Communication: Mobility status PT Visit Diagnosis: Muscle weakness (generalized) (M62.81);Difficulty in walking, not elsewhere classified (R26.2)    Time: 0086-7619 PT Time Calculation (min) (ACUTE ONLY): 24 min   Charges:   PT Evaluation $PT Eval Low Complexity: 1 Low PT Treatments $Gait Training: 8-22 mins        Kreg Shropshire, DPT 12/01/2019, 1:38 PM

## 2019-12-05 ENCOUNTER — Telehealth: Payer: Self-pay

## 2019-12-05 ENCOUNTER — Ambulatory Visit (INDEPENDENT_AMBULATORY_CARE_PROVIDER_SITE_OTHER): Payer: Medicare Other | Admitting: Family Medicine

## 2019-12-05 ENCOUNTER — Other Ambulatory Visit: Payer: Self-pay

## 2019-12-05 VITALS — BP 120/60 | HR 61 | Temp 97.3°F | Wt 147.8 lb

## 2019-12-05 DIAGNOSIS — M25552 Pain in left hip: Secondary | ICD-10-CM | POA: Insufficient documentation

## 2019-12-05 DIAGNOSIS — R2689 Other abnormalities of gait and mobility: Secondary | ICD-10-CM | POA: Diagnosis not present

## 2019-12-05 DIAGNOSIS — G8929 Other chronic pain: Secondary | ICD-10-CM | POA: Diagnosis not present

## 2019-12-05 DIAGNOSIS — M25561 Pain in right knee: Secondary | ICD-10-CM

## 2019-12-05 LAB — BASIC METABOLIC PANEL
BUN: 30 mg/dL — ABNORMAL HIGH (ref 6–23)
CO2: 27 mEq/L (ref 19–32)
Calcium: 6.9 mg/dL — ABNORMAL LOW (ref 8.4–10.5)
Chloride: 104 mEq/L (ref 96–112)
Creatinine, Ser: 1.28 mg/dL — ABNORMAL HIGH (ref 0.40–1.20)
GFR: 40 mL/min — ABNORMAL LOW (ref >60.00)
Glucose, Bld: 83 mg/dL (ref 70–99)
Potassium: 4.5 mEq/L (ref 3.5–5.1)
Sodium: 137 mEq/L (ref 135–145)

## 2019-12-05 NOTE — Assessment & Plan Note (Signed)
Acute worsening of chronic knee pain. Suspect arthritis. PT referral.

## 2019-12-05 NOTE — Progress Notes (Signed)
Subjective:     Jamie Matthews is a 79 y.o. female presenting for Hip Pain (L hip ), Knee Pain (R knee), and Hospitalization Follow-up (11/30/19)     HPI  #Hypocalcemia - taking her supplement  #Hip pain - left - told to stop taking the medication while in the hospital - has not been doing home PT - tried ice 1-2 times - not currently working with   #Right knee pain - symptoms started 1 month ago - lateral side of the knee - using walker for stability  - knee is worse with uphill activity    Review of Systems  10/20/2019: Clinic - Trochanteric bursitis of hip - home PT, Ice, low dose meloxicam 10/31/2019: ER - Fall - laceration repair, hematoma, CT w/ known mass 7/7-12/01/2019: Admission - Abnormal labs - weakness bradycardic and hypocalcemic - not taking supplement. Hip pain - normal XR, hold NSAIDs  Social History   Tobacco Use  Smoking Status Never Smoker  Smokeless Tobacco Never Used        Objective:    BP Readings from Last 3 Encounters:  12/05/19 120/60  12/01/19 102/66  11/30/19 132/78   Wt Readings from Last 3 Encounters:  12/05/19 147 lb 12 oz (67 kg)  11/30/19 154 lb 8.7 oz (70.1 kg)  11/30/19 154 lb 8 oz (70.1 kg)    BP 120/60   Pulse 61   Temp (!) 97.3 F (36.3 C) (Temporal)   Wt 147 lb 12 oz (67 kg)   SpO2 95%   BMI 26.17 kg/m    Physical Exam Constitutional:      General: She is not in acute distress.    Appearance: She is well-developed. She is not diaphoretic.  HENT:     Right Ear: External ear normal.     Left Ear: External ear normal.     Nose: Nose normal.  Eyes:     Conjunctiva/sclera: Conjunctivae normal.  Cardiovascular:     Rate and Rhythm: Normal rate and regular rhythm.  Pulmonary:     Effort: Pulmonary effort is normal.     Breath sounds: Normal breath sounds.  Musculoskeletal:     Cervical back: Neck supple.     Comments: Right knee Inspection: mild swelling and LE edema, mild erythema at area of  tenderness Palpation: TTP along the lateral distal femur   Skin:    General: Skin is warm and dry.     Capillary Refill: Capillary refill takes less than 2 seconds.  Neurological:     Mental Status: She is alert. Mental status is at baseline.  Psychiatric:        Mood and Affect: Mood normal.        Behavior: Behavior normal.           Assessment & Plan:   Problem List Items Addressed This Visit      Other   Chronic pain of right knee    Acute worsening of chronic knee pain. Suspect arthritis. PT referral.       Hypocalcemia    Recent hospital stay with mild improvement. Pt endorses taking her Ca supplement. Next Oncology appt is in August. Repeat BMP today. Cont supplement.       Relevant Orders   Basic metabolic panel   Left hip pain - Primary    Not interested in steroid injection. PT referral. Encouraged voltaren gel and tumeric trial for inflammation.       Relevant Orders   Ambulatory referral  to Physical Therapy    Other Visit Diagnoses    Acute pain of right knee       Relevant Orders   Ambulatory referral to Physical Therapy   Balance problem       Relevant Orders   Ambulatory referral to Physical Therapy       Return if symptoms worsen or fail to improve.  [name], MD  This visit occurred during the SARS-CoV-2 public health emergency.  Safety protocols were in place, including screening questions prior to the visit, additional usage of staff PPE, and extensive cleaning of exam room while observing appropriate contact time as indicated for disinfecting solutions.

## 2019-12-05 NOTE — Assessment & Plan Note (Signed)
Recent hospital stay with mild improvement. Pt endorses taking her Ca supplement. Next Oncology appt is in August. Repeat BMP today. Cont supplement.

## 2019-12-05 NOTE — Telephone Encounter (Signed)
please schedule patient for lab/MD/ poss calcium in 4 weeks. Notify pt of appt please.

## 2019-12-05 NOTE — Assessment & Plan Note (Signed)
Not interested in steroid injection. PT referral. Encouraged voltaren gel and tumeric trial for inflammation.

## 2019-12-05 NOTE — Patient Instructions (Addendum)
Knee pain and hip pain - can try pain treatments below - physical therapy referral placed   Curcumin or Tumeric  Research has shown that Curcumin (Tumeric) supplements are just as good as high dose anti-inflammatory medications (like diclofenac and ibuprofen) at treating pain from osteoarthritis without the side effects.   Take Curcumin 500 mg Three times daily   -- You can find a bottle at [employer] for $8 which should last a month -- Lorenzo is around $9 and is available at [place] to Voltaren Gel - on your knee and hip

## 2019-12-05 NOTE — Telephone Encounter (Signed)
Done Pts  lab/MD/+/- Calcium in 4 weeks appts has been sched as requested I tried calling her x2 I was unable to reach her by phone. An updated copy of her NEW sched appts will be mailed out to her.

## 2019-12-06 ENCOUNTER — Other Ambulatory Visit: Payer: Self-pay | Admitting: Family Medicine

## 2019-12-06 ENCOUNTER — Telehealth: Payer: Self-pay

## 2019-12-06 NOTE — Telephone Encounter (Signed)
Left VM for pt (per DPR) and told her calcium level was low, relayed Dr. Verda Cumins suggestions of what to eat to help with calcium and also of the referral to endocrinology, that Dr. Einar Pheasant submitted.

## 2019-12-08 ENCOUNTER — Encounter: Payer: Self-pay | Admitting: Family Medicine

## 2019-12-14 ENCOUNTER — Other Ambulatory Visit: Payer: Self-pay | Admitting: Oncology

## 2019-12-14 ENCOUNTER — Telehealth: Payer: Self-pay

## 2019-12-14 DIAGNOSIS — C3490 Malignant neoplasm of unspecified part of unspecified bronchus or lung: Secondary | ICD-10-CM

## 2019-12-14 NOTE — Telephone Encounter (Signed)
CBC Order: [id number] Status:  Final result Visible to patient:  Yes (not seen) Next appt:  12/15/2019 at 12:20 PM in [facility] [name], MD)  0 Result Notes  Ref Range & Units 13 d ago 2 wk ago  WBC 4.0 - 10.5 K/uL 5.0  6.3   RBC 3.87 - 5.11 MIL/uL 2.78Low  3.26Low   Hemoglobin 12.0 - 15.0 g/dL 10.1Low  10.4Low   HCT 36 - 46 % 27.2Low  31.4Low   MCV 80.0 - 100.0 fL 97.8  96.3   MCH 26.0 - 34.0 pg 36.3High  31.9   MCHC 30.0 - 36.0 g/dL 37.1High  33.1   RDW 11.5 - 15.5 % 13.8  13.5   Platelets 150 - 400 K/uL 310  321   nRBC 0.0 - 0.2 % 0.0  0.0   Comment: Performed at [facility], [place], [place] [address]  Neutrophils Relative %   43 R   Basophils Absolute   0.0 R   Immature Granulocytes   1 R   Abs Immature Granulocytes   0.04 R, CM   Neutro Abs   2.8 R   Lymphocytes Relative   29 R   Lymphs Abs   1.8 R   Monocytes Relative   20 R   Monocytes Absolute   1.2High R   Eosinophils Relative   6 R   Eosinophils Absolute   0.4 R   Basophils Relative   1 R   Resulting Agency  [facility]      Specimen Collected: 12/01/19 06:26 Last Resulted: 12/01/19 07:04       Basic metabolic panel Order: [id number] Status:  Final result Visible to patient:  Yes (not seen) Next appt:  12/15/2019 at 12:20 PM in [place] [name], MD) Dx:  Hypocalcemia  1 Result Note   1 Patient Communication  Ref Range & Units 9 d ago  (12/05/19) 13 d ago  (12/01/19) 13 d ago  (12/01/19) 2 wk ago  (11/30/19)  Sodium 135 - 145 mEq/L 137  134Low R  134Low R  134Low R   Potassium 3.5 - 5.1 mEq/L 4.5  4.2 R  4.2 R  3.8 R   Chloride 96 - 112 mEq/L 104  100 R  99 R  98 R   CO2 19 - 32 mEq/L 27  27 R  30 R  28 R   Glucose, Bld 70 - 99 mg/dL 83  109High CM  98 CM  113High CM   BUN 6 - 23 mg/dL 30High  33High R  33High R  40High R   Creatinine, Ser 0.40 - 1.20 mg/dL 1.28High  1.23High R  1.20High R  1.31High  R   GFR >60.00 mL/min 40.00Low      Calcium 8.4 - 10.5 mg/dL 6.9Low  7.2Low R  7.0Low R  6.9Low R   Resulting Agency  [facility]      Specimen Collected: 12/05/19 12:00 Last Resulted: 12/05/19 15:59

## 2019-12-14 NOTE — Telephone Encounter (Signed)
[place] Night - Client Nonclinical Telephone Record AccessNurse Client [place] Night - Client Client Site [place] Physician Waunita Schooner- MD Contact Type Call Who Is Calling Patient / Member / Family / Caregiver Caller Name [place] Phone Number [phone] Patient Name Jamie Matthews Patient DOB 07-13-1940 Call Type Message Only Information Provided Reason for Call Request to [facility] Appointment Initial Comment Caller wants to cancel her appt with Dr. Einar Pheasant at [phone] tomorrow. Additional Comment Office hours provided. Declined triage. Disp. Time Disposition Final User 12/13/2019 6:16:19 PM General Information Provided Yes Gokounous, Erin Call Closed By: Candy Sledge Transaction Date/Time: 12/13/2019 6:13:17 PM (ET)

## 2019-12-15 ENCOUNTER — Ambulatory Visit: Payer: Medicare Other | Admitting: Family Medicine

## 2019-12-16 DIAGNOSIS — M6281 Muscle weakness (generalized): Secondary | ICD-10-CM | POA: Diagnosis not present

## 2019-12-16 DIAGNOSIS — R278 Other lack of coordination: Secondary | ICD-10-CM | POA: Diagnosis not present

## 2019-12-16 DIAGNOSIS — R2689 Other abnormalities of gait and mobility: Secondary | ICD-10-CM | POA: Diagnosis not present

## 2019-12-16 DIAGNOSIS — R296 Repeated falls: Secondary | ICD-10-CM | POA: Diagnosis not present

## 2019-12-19 DIAGNOSIS — R278 Other lack of coordination: Secondary | ICD-10-CM | POA: Diagnosis not present

## 2019-12-19 DIAGNOSIS — R296 Repeated falls: Secondary | ICD-10-CM | POA: Diagnosis not present

## 2019-12-19 DIAGNOSIS — R2689 Other abnormalities of gait and mobility: Secondary | ICD-10-CM | POA: Diagnosis not present

## 2019-12-19 DIAGNOSIS — M6281 Muscle weakness (generalized): Secondary | ICD-10-CM | POA: Diagnosis not present

## 2019-12-20 DIAGNOSIS — M8589 Other specified disorders of bone density and structure, multiple sites: Secondary | ICD-10-CM | POA: Diagnosis not present

## 2019-12-20 DIAGNOSIS — E559 Vitamin D deficiency, unspecified: Secondary | ICD-10-CM | POA: Diagnosis not present

## 2019-12-21 ENCOUNTER — Telehealth: Payer: Self-pay | Admitting: Family Medicine

## 2019-12-21 MED FILL — XALKORI 250 MG CAPSULE: 250 | 30 days supply | Qty: 60 | Fill #0

## 2019-12-21 NOTE — Progress Notes (Signed)
  Chronic Care Management   Outreach Note  12/21/2019 Name: Jamie Matthews MRN: [id number] DOB: 17-Aug-1940  Referred by: [name], MD Reason for referral : No chief complaint on file.   An unsuccessful telephone outreach was attempted today. The patient was referred to the pharmacist for assistance with care management and care coordination.   Follow Up Plan:   Earney Hamburg Upstream Scheduler

## 2019-12-22 ENCOUNTER — Ambulatory Visit: Payer: Medicare Other | Admitting: Family Medicine

## 2019-12-23 ENCOUNTER — Telehealth: Payer: Self-pay | Admitting: Family Medicine

## 2019-12-23 NOTE — Progress Notes (Signed)
  Chronic Care Management   Note  12/23/2019 Name: Jamie Matthews MRN: [id number] DOB: 26-Jul-1940  Jamie Matthews Jamie Matthews is a 79 y.o. year old female who is a primary care patient of Einar Pheasant Jobe Marker, MD. I reached out to [name] by phone today in response to a referral sent by Jamie Matthews's PCP, Jamie Noe, MD.   [name] was given information about Chronic Care Management services today including:  1. CCM service includes personalized support from designated clinical staff supervised by her physician, including individualized plan of care and coordination with other care providers 2. 24/7 contact phone numbers for assistance for urgent and routine care needs. 3. Service will only be billed when office clinical staff spend 20 minutes or more in a month to coordinate care. 4. Only one practitioner may furnish and bill the service in a calendar month. 5. The patient may stop CCM services at any time (effective at the end of the month) by phone call to the office staff.   Patient agreed to services and verbal consent obtained.   Follow up plan:   Earney Hamburg Upstream Scheduler

## 2019-12-29 DIAGNOSIS — R296 Repeated falls: Secondary | ICD-10-CM | POA: Diagnosis not present

## 2019-12-29 DIAGNOSIS — M6281 Muscle weakness (generalized): Secondary | ICD-10-CM | POA: Diagnosis not present

## 2019-12-29 DIAGNOSIS — R278 Other lack of coordination: Secondary | ICD-10-CM | POA: Diagnosis not present

## 2019-12-29 DIAGNOSIS — R2689 Other abnormalities of gait and mobility: Secondary | ICD-10-CM | POA: Diagnosis not present

## 2019-12-30 DIAGNOSIS — M6281 Muscle weakness (generalized): Secondary | ICD-10-CM | POA: Diagnosis not present

## 2019-12-30 DIAGNOSIS — R2689 Other abnormalities of gait and mobility: Secondary | ICD-10-CM | POA: Diagnosis not present

## 2019-12-30 DIAGNOSIS — R278 Other lack of coordination: Secondary | ICD-10-CM | POA: Diagnosis not present

## 2019-12-30 DIAGNOSIS — R296 Repeated falls: Secondary | ICD-10-CM | POA: Diagnosis not present

## 2020-01-02 ENCOUNTER — Inpatient Hospital Stay: Payer: Medicare Other | Attending: Oncology

## 2020-01-02 ENCOUNTER — Encounter: Payer: Self-pay | Admitting: Oncology

## 2020-01-02 ENCOUNTER — Inpatient Hospital Stay: Payer: Medicare Other

## 2020-01-02 ENCOUNTER — Other Ambulatory Visit: Payer: Self-pay

## 2020-01-02 ENCOUNTER — Inpatient Hospital Stay (HOSPITAL_BASED_OUTPATIENT_CLINIC_OR_DEPARTMENT_OTHER): Payer: Medicare Other | Admitting: Oncology

## 2020-01-02 VITALS — BP 112/56 | HR 59 | Temp 96.8°F | Resp 18 | Wt 141.6 lb

## 2020-01-02 DIAGNOSIS — C3490 Malignant neoplasm of unspecified part of unspecified bronchus or lung: Secondary | ICD-10-CM | POA: Diagnosis not present

## 2020-01-02 DIAGNOSIS — M7989 Other specified soft tissue disorders: Secondary | ICD-10-CM | POA: Diagnosis not present

## 2020-01-02 DIAGNOSIS — M8580 Other specified disorders of bone density and structure, unspecified site: Secondary | ICD-10-CM

## 2020-01-02 DIAGNOSIS — I700 Atherosclerosis of aorta: Secondary | ICD-10-CM | POA: Insufficient documentation

## 2020-01-02 DIAGNOSIS — M25552 Pain in left hip: Secondary | ICD-10-CM | POA: Insufficient documentation

## 2020-01-02 DIAGNOSIS — Z79899 Other long term (current) drug therapy: Secondary | ICD-10-CM | POA: Insufficient documentation

## 2020-01-02 DIAGNOSIS — Z808 Family history of malignant neoplasm of other organs or systems: Secondary | ICD-10-CM | POA: Diagnosis not present

## 2020-01-02 DIAGNOSIS — C50912 Malignant neoplasm of unspecified site of left female breast: Secondary | ICD-10-CM

## 2020-01-02 DIAGNOSIS — C50012 Malignant neoplasm of nipple and areola, left female breast: Secondary | ICD-10-CM | POA: Diagnosis not present

## 2020-01-02 DIAGNOSIS — Z803 Family history of malignant neoplasm of breast: Secondary | ICD-10-CM | POA: Insufficient documentation

## 2020-01-02 DIAGNOSIS — E038 Other specified hypothyroidism: Secondary | ICD-10-CM

## 2020-01-02 DIAGNOSIS — Z170 Estrogen receptor positive status [ER+]: Secondary | ICD-10-CM | POA: Insufficient documentation

## 2020-01-02 DIAGNOSIS — Z5111 Encounter for antineoplastic chemotherapy: Secondary | ICD-10-CM

## 2020-01-02 DIAGNOSIS — Z79811 Long term (current) use of aromatase inhibitors: Secondary | ICD-10-CM | POA: Insufficient documentation

## 2020-01-02 DIAGNOSIS — C3492 Malignant neoplasm of unspecified part of left bronchus or lung: Secondary | ICD-10-CM

## 2020-01-02 DIAGNOSIS — N179 Acute kidney failure, unspecified: Secondary | ICD-10-CM

## 2020-01-02 DIAGNOSIS — C3412 Malignant neoplasm of upper lobe, left bronchus or lung: Secondary | ICD-10-CM | POA: Insufficient documentation

## 2020-01-02 DIAGNOSIS — K5909 Other constipation: Secondary | ICD-10-CM | POA: Diagnosis not present

## 2020-01-02 DIAGNOSIS — R600 Localized edema: Secondary | ICD-10-CM | POA: Insufficient documentation

## 2020-01-02 DIAGNOSIS — Z8249 Family history of ischemic heart disease and other diseases of the circulatory system: Secondary | ICD-10-CM | POA: Insufficient documentation

## 2020-01-02 DIAGNOSIS — Z833 Family history of diabetes mellitus: Secondary | ICD-10-CM | POA: Diagnosis not present

## 2020-01-02 DIAGNOSIS — K5900 Constipation, unspecified: Secondary | ICD-10-CM | POA: Insufficient documentation

## 2020-01-02 DIAGNOSIS — K219 Gastro-esophageal reflux disease without esophagitis: Secondary | ICD-10-CM | POA: Insufficient documentation

## 2020-01-02 LAB — COMPREHENSIVE METABOLIC PANEL WITH GFR
ALT: 24 U/L (ref 0–44)
AST: 25 U/L (ref 15–41)
Albumin: 3.5 g/dL (ref 3.5–5.0)
Alkaline Phosphatase: 92 U/L (ref 38–126)
Anion gap: 8 (ref 5–15)
BUN: 19 mg/dL (ref 8–23)
CO2: 29 mmol/L (ref 22–32)
Calcium: 8.7 mg/dL — ABNORMAL LOW (ref 8.9–10.3)
Chloride: 105 mmol/L (ref 98–111)
Creatinine, Ser: 0.95 mg/dL (ref 0.44–1.00)
GFR calc Af Amer: >60 mL/min
GFR calc non Af Amer: 56 mL/min — ABNORMAL LOW
Glucose, Bld: 132 mg/dL — ABNORMAL HIGH (ref 70–99)
Potassium: 5 mmol/L (ref 3.5–5.1)
Sodium: 142 mmol/L (ref 135–145)
Total Bilirubin: 0.7 mg/dL (ref 0.3–1.2)
Total Protein: 6.5 g/dL (ref 6.5–8.1)

## 2020-01-02 LAB — CBC WITH DIFFERENTIAL/PLATELET
Abs Immature Granulocytes: 0.04 10*3/uL (ref 0.00–0.07)
Basophils Absolute: 0 10*3/uL (ref 0.0–0.1)
Basophils Relative: 1 %
Eosinophils Absolute: 0.3 10*3/uL (ref 0.0–0.5)
Eosinophils Relative: 5 %
HCT: 37.8 % (ref 36.0–46.0)
Hemoglobin: 12.3 g/dL (ref 12.0–15.0)
Immature Granulocytes: 1 %
Lymphocytes Relative: 36 %
Lymphs Abs: 2.3 10*3/uL (ref 0.7–4.0)
MCH: 32.3 pg (ref 26.0–34.0)
MCHC: 32.5 g/dL (ref 30.0–36.0)
MCV: 99.2 fL (ref 80.0–100.0)
Monocytes Absolute: 0.9 10*3/uL (ref 0.1–1.0)
Monocytes Relative: 14 %
Neutro Abs: 2.9 10*3/uL (ref 1.7–7.7)
Neutrophils Relative %: 43 %
Platelets: 302 10*3/uL (ref 150–400)
RBC: 3.81 MIL/uL — ABNORMAL LOW (ref 3.87–5.11)
RDW: 13.4 % (ref 11.5–15.5)
WBC: 6.5 10*3/uL (ref 4.0–10.5)
nRBC: 0 % (ref 0.0–0.2)

## 2020-01-02 MED ORDER — DENOSUMAB 120 MG/1.7ML ~~LOC~~ SOLN
120.0000 mg | Freq: Once | SUBCUTANEOUS | Status: AC
  Administered 2020-01-02: 120 mg via SUBCUTANEOUS
  Filled 2020-01-02: qty 1.7

## 2020-01-02 MED ORDER — DOCUSATE SODIUM 100 MG PO CAPS
100.0000 mg | ORAL_CAPSULE | Freq: Every day | ORAL | 0 refills | Status: DC
Start: 2020-01-02 — End: 2020-10-16

## 2020-01-02 NOTE — Progress Notes (Signed)
Pt here for follow up. No new concerns voiced. No new breast problems

## 2020-01-02 NOTE — Progress Notes (Signed)
Hematology/Oncology follow up note [facility] Telephone:(336) [phone] Fax:(336) [phone]   Patient Care Team: [name], MD as PCP - General (Family Medicine) Osborne Oman, MD as Consulting Physician (Obstetrics and Gynecology) Pyrtle, Lajuan Lines, MD as Consulting Physician (Gastroenterology) Telford Nab, RN as Registered Nurse Earlie Server, MD as Consulting Physician (Hematology and Oncology) Debbora Dus, [facility] as Pharmacist (Pharmacist) Burnice Logan, [facility] as Pharmacist (Pharmacist)   REASON FOR VISIT:  Follow up for management of lung cancer and breast cancer  HISTORY OF PRESENTING ILLNESS:  Jamie Matthews is a  79 y.o.  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 05/05/2018 bilateral diagnostic breast mammogram showed suspicious mass 1.1cm  in the 12:00 retroareolar region of the left breast and the left axillary lymph node. 06/08/2018 patient status post a left breast retroareolar and left axillary lymph node biopsy. Pathology showed invasive mammary carcinoma, no special type, grade 1, left axillary lymph node positive for invasive mammary carcinoma clinically metastatic.  Background lymph node architecture is not identified. ER> 90% PR> 90%, HER-2 negative.  PET scan done which unfortunately showed additional hypermetabolic bilateral hilar lymph nodes as well as left upper lobe lung mass which may represent a focus of metastatic disease from breast, or primary lung neoplasm.  There is multiple additional small pulmonary nodules are scattered throughout both lungs which are worrisome for metastasis.  Index nodule within the medial right upper lobe measures 7 mm,, peri-broncho-vascular nodule in the right lower lobe measures 1.2 cm.  # Stage IV lung cancer-  Biopsy of lung mass left upper lobe showed non-small cell lung cancer, favor adenocarcinoma.  #NGS came back patient has PD L1 is 70% TPS, MET  fusion mutation.  #Mid-March 2020 started on crizotinib  Bilateral lower extremity edema, right >left, Ultrasound venous right 09/20/2018 no DVT.  2D echo 10/10/2018 showed LVEF 55 to 60% Most likely secondary to crizotinib side effects.  # 12/15/2018 CT chest images were independently reviewed and discussed with patient. Dominant left upper lobe nodule and multiple scattered irregular pulmonary parenchymal nodular lesions are stable. Mild basilar pulmonary parenchymal septal thickening is new and can be seen with pulmonary edema. Patient reports no shortness of breath asymptomatic  #12/29/2018 unilateral left diagnostic mammogram with ultrasound images were independently reviewed and discussed with patient. Slight interval reduction of the size of the left retroareolar breast mass, 9 x 6 x 7 mm, comparing to 10 x 9 x 8 mm. Axillary lymph node measured 1.3 x 1.0 x 1.0 cm, previously 2.2 x 1.4 x 1.5 cm,  there were 2 other abdominal lymph nodes in the left axilla, which were not reported in previous ultrasound. Discussed with patient that overall, breast cancer responded to endocrine treatments.  Additional 2 lymph nodes need to be closely monitored.  Recommend patient to continue Arimidex.   INTERVAL HISTORY Jamie Matthews is a 79 y.o. female who has above history reviewed by me today presents for acute visit for nausea, and constipation.  Patient reports feeling well today.  No new complaints. She is now compliant with calcium supplementation. Denies any dizziness, syncopal episodes, shortness of breath, abdominal pain, fever or chills. Chronic lower extremity edema symptoms are stable.  She has had home physical therapy recently. She still have constipation and uses MiraLAX as needed. Review of Systems  Constitutional: Negative for appetite change, chills, fatigue and fever.  HENT:   Negative for hearing loss and voice  change.   Eyes: Negative for eye problems.  Respiratory:  Negative for chest tightness and cough.   Cardiovascular: Positive for leg swelling. Negative for chest pain.  Gastrointestinal: Negative for abdominal distention, abdominal pain, blood in stool, constipation and nausea.  Endocrine: Negative for hot flashes.  Genitourinary: Negative for difficulty urinating and frequency.   Musculoskeletal: Negative for arthralgias.  Skin: Negative for itching and rash.  Neurological: Negative for extremity weakness and light-headedness.  Hematological: Negative for adenopathy.  Psychiatric/Behavioral: Negative for confusion and sleep disturbance.    MEDICAL HISTORY:  Past Medical History:  Diagnosis Date  . AKI (acute kidney injury) ([place]) 08/17/2018  . Anxiety   . Arthritis   . Belching   . Bladder disorder    OVERACTIVE  . Bowel dysfunction    BLOCKAGE  . Cancer (HCC)    breast  . Constipation   . Depression   . Diverticulitis   . Fibromyalgia   . GERD (gastroesophageal reflux disease)   . Hyperlipidemia   . IBS (irritable bowel syndrome)   . Internal hemorrhoids   . Lung cancer ([place]) 2020  . Memory deficits   . Murmur    asymptomatic  . Pneumonia 11/18/12  . Urinary incontinence   . Vertigo     SURGICAL HISTORY: Past Surgical History:  Procedure Laterality Date  . AXILLARY LYMPH NODE BIOPSY Left 06/08/2018   INVASIVE MAMMARY CARCINOMA  . BLADDER SUSPENSION  2004, 2012  . BREAST BIOPSY Left 06/08/2018   INVASIVE MAMMARY CARCINOMA  . CATARACT EXTRACTION W/PHACO Right 08/27/2015   Procedure: CATARACT EXTRACTION PHACO AND INTRAOCULAR LENS PLACEMENT (IOC);  Surgeon: Estill Cotta, MD;  Location: [facility] ORS;  Service: Ophthalmology;  Laterality: Right;  [place]   1:00.2 AP%  22.5 CDE  23.67 fluid casette lot #[id number]  exp05/31/2018  . CATARACT EXTRACTION W/PHACO Left 10/15/2015   Procedure: CATARACT EXTRACTION PHACO AND INTRAOCULAR LENS PLACEMENT (IOC);  Surgeon: Estill Cotta, MD;  Location: [facility] ORS;  Service: Ophthalmology;   Laterality: Left;  [place] 01:07 AP% 18.1 CDE 21.57 fluid pack lot # [id number]  . CHOLECYSTECTOMY    . COLONOSCOPY  2017  . ELECTROMAGNETIC NAVIGATION BROCHOSCOPY N/A 07/09/2018   Procedure: ELECTROMAGNETIC NAVIGATION BRONCHOSCOPY;  Surgeon: Flora Lipps, MD;  Location: [facility] ORS;  Service: Cardiopulmonary;  Laterality: N/A;  . TONSILLECTOMY  1947    SOCIAL HISTORY: Social History   Socioeconomic History  . Marital status: Married    Spouse name: Bennye Alm  . Number of children: 0  . Years of education: Master's Degree  . Highest education level: Not on file  Occupational History  . Occupation: Retired  Tobacco Use  . Smoking status: Never Smoker  . Smokeless tobacco: Never Used  Vaping Use  . Vaping Use: Never used  Substance and Sexual Activity  . Alcohol use: Yes    Comment: rare wine  . Drug use: No  . Sexual activity: Not Currently  Other Topics Concern  . Not on file  Social History Narrative   Recently moved with her husband to [place] from [place].   Husband is a retired Pharmacist, community.   No children.   She is a retired Pharmacist, hospital.   Enjoys: Chief Technology Officer, reading - mysteries and biographies, cooking   Exercise: walking, gardening   Diet: low appetite due to cancer treatment, grazing   She is a DNR.   Social Determinants of Health   Financial Resource Strain:   . Difficulty of Paying Living Expenses:   Food Insecurity:   .  Worried About Charity fundraiser in the Last Year:   . Arboriculturist in the Last Year:   Transportation Needs:   . Film/video editor (Medical):   [name] Lack of Transportation (Non-Medical):   Physical Activity:   . Days of Exercise per Week:   . Minutes of Exercise per Session:   Stress:   . Feeling of Stress :   Social Connections:   . Frequency of Communication with Friends and Family:   . Frequency of Social Gatherings with Friends and Family:   . Attends Religious Services:   . Active Member of Clubs or Organizations:   . Attends English as a second language teacher Meetings:   [name] Marital Status:   Intimate Partner Violence:   . Fear of Current or Ex-Partner:   . Emotionally Abused:   [name] Physically Abused:   . Sexually Abused:     FAMILY HISTORY: Family History  Problem Relation Age of Onset  . Diabetes Father   . Heart disease Father   . Lymphoma Father   . Heart disease Mother   . Breast cancer Sister 54  . Colon cancer Neg Hx   . Esophageal cancer Neg Hx   . Rectal cancer Neg Hx   . Stomach cancer Neg Hx   . Bladder Cancer Neg Hx   . Kidney cancer Neg Hx     ALLERGIES:  is allergic to sulfa antibiotics.  MEDICATIONS:  Current Outpatient Medications  Medication Sig Dispense Refill  . acetaminophen (TYLENOL) 325 MG tablet Take 325 mg by mouth every 6 (six) hours as needed for mild pain.     [name] anastrozole (ARIMIDEX) 1 MG tablet TAKE 1 TABLET BY MOUTH DAILY (Patient taking differently: Take 1 mg by mouth daily. ) 30 tablet 3  . atorvastatin (LIPITOR) 20 MG tablet Take 1 tablet (20 mg total) by mouth at bedtime. 90 tablet 0  . buPROPion (WELLBUTRIN XL) 150 MG 24 hr tablet TAKE ONE TABLET BY MOUTH EVERY DAY (Patient taking differently: Take 150 mg by mouth daily. ) 90 tablet 2  . Calcium-Magnesium-Vitamin D (CALCIUM 1200+D3 PO) Take 1 tablet by mouth daily.    [name] desonide (DESOWEN) 0.05 % lotion Apply 1 application topically as needed (after showering).     . fluticasone (FLONASE) 50 MCG/ACT nasal spray Place 2 sprays into both nostrils daily. 16 g 6  . ketoconazole (NIZORAL) 2 % shampoo Apply 1 application topically. 3 times a week    . levothyroxine (SYNTHROID) 25 MCG tablet Take 1 tablet (25 mcg total) by mouth daily. 30 tablet 1  . lubiprostone (AMITIZA) 24 MCG capsule TAKE ONE CAPSULE TWICE A DAY WITH MEALS (Patient taking differently: Take 24 mcg by mouth 2 (two) times daily with a meal. TAKE ONE CAPSULE TWICE A DAY WITH MEALS) 60 capsule 3  . memantine (NAMENDA) 10 MG tablet TAKE ONE TABLET BY MOUTH TWICE DAILY (Patient  taking differently: Take 10 mg by mouth 2 (two) times daily. ) 180 tablet 2  . mirabegron ER (MYRBETRIQ) 50 MG TB24 tablet Take 1 tablet (50 mg total) by mouth daily. 90 tablet 1  . Multiple Vitamin (MULTIVITAMIN WITH MINERALS) TABS tablet Take 1 tablet by mouth daily.    [name] omeprazole (PRILOSEC) 40 MG capsule Take 40 mg by mouth daily.    . ondansetron (ZOFRAN) 8 MG tablet TAKE ONE TABLET EVERY EIGHT HOURS AS NEEDED FOR NAUSEA / VOMITING (Patient taking differently: Take 8 mg by mouth every 8 (eight) hours as  needed for nausea or vomiting. ) 60 tablet 1  . polyethylene glycol (MIRALAX / GLYCOLAX) packet Take 17 g by mouth daily.    . sucralfate (CARAFATE) 1 g tablet Take 1 tablet (1 g total) by mouth 2 (two) times daily. 60 tablet 0  . triamcinolone (NASACORT) 55 MCG/ACT AERO nasal inhaler Place 2 sprays into the nose daily as needed (for congestion).     . venlafaxine XR (EFFEXOR-XR) 150 MG 24 hr capsule TAKE 1 CAPSULE BY MOUTH EVERY DAY 90 capsule 1  . Vitamin D, Ergocalciferol, (DRISDOL) 1.25 MG (50000 UNIT) CAPS capsule Take 50,000 Units by mouth once a week.    [name] XALKORI 250 MG capsule TAKE 1 CAPSULE (250 MG TOTAL) BY MOUTH 2 TIMES DAILY. 60 capsule 2  . Dextromethorphan-guaiFENesin (MUCINEX DM) 30-600 MG TB12 Take 1 tablet by mouth 2 (two) times daily as needed (for congestion/cough). (Patient not taking: Reported on 01/02/2020)     No current facility-administered medications for this visit.   Facility-Administered Medications Ordered in Other Visits  Medication Dose Route Frequency Provider Last Rate Last Admin  . denosumab (XGEVA) injection 120 mg  120 mg Subcutaneous Once Earlie Server, MD         PHYSICAL EXAMINATION: ECOG PERFORMANCE STATUS: 2 - Symptomatic, <50% confined to bed Vitals:   01/02/20 0852  BP: (!) 112/56  Pulse: (!) 59  Resp: 18  Temp: (!) 96.8 F (36 C)   Filed Weights   01/02/20 0852  Weight: 141 lb 9.6 oz (64.2 kg)    Physical Exam Constitutional:       General: She is not in acute distress.    Appearance: She is not diaphoretic.     Comments: Frail female, walks with a walker  HENT:     Head: Normocephalic and atraumatic.     Nose: Nose normal.     Mouth/Throat:     Pharynx: No oropharyngeal exudate.  Eyes:     General: No scleral icterus.    Pupils: Pupils are equal, round, and reactive to light.  Cardiovascular:     Rate and Rhythm: Normal rate and regular rhythm.     Heart sounds: No murmur heard.   Pulmonary:     Effort: Pulmonary effort is normal. No respiratory distress.     Breath sounds: No wheezing or rales.  Chest:     Chest wall: No tenderness.  Abdominal:     General: There is no distension.     Palpations: Abdomen is soft.     Tenderness: There is no abdominal tenderness.  Musculoskeletal:        General: Normal range of motion.     Cervical back: Normal range of motion and neck supple.     Comments: Bilateral lower extremity 2+ edema  Skin:    General: Skin is warm and dry.     Findings: No erythema.  Neurological:     Mental Status: She is alert and oriented to person, place, and time.     Cranial Nerves: No cranial nerve deficit.     Motor: No abnormal muscle tone.     Coordination: Coordination normal.  Psychiatric:        Mood and Affect: Affect normal.      CMP Latest Ref Rng & Units 01/02/2020  Glucose 70 - 99 mg/dL 132(H)  BUN 8 - 23 mg/dL 19  Creatinine 0.44 - 1.00 mg/dL 0.95  Sodium 135 - 145 mmol/L 142  Potassium 3.5 - 5.1 mmol/L 5.0  Chloride 98 - 111 mmol/L 105  CO2 22 - 32 mmol/L 29  Calcium 8.9 - 10.3 mg/dL 8.7(L)  Total Protein 6.5 - 8.1 g/dL 6.5  Total Bilirubin 0.3 - 1.2 mg/dL 0.7  Alkaline Phos 38 - 126 U/L 92  AST 15 - 41 U/L 25  ALT 0 - 44 U/L 24   CBC Latest Ref Rng & Units 01/02/2020  WBC 4.0 - 10.5 K/uL 6.5  Hemoglobin 12.0 - 15.0 g/dL 12.3  Hematocrit 36 - 46 % 37.8  Platelets 150 - 400 K/uL 302    LABORATORY DATA:  I have reviewed the data as listed Lab Results    Component Value Date   WBC 6.5 01/02/2020   HGB 12.3 01/02/2020   HCT 37.8 01/02/2020   MCV 99.2 01/02/2020   PLT 302 01/02/2020   Recent Labs    11/30/19 1023 11/30/19 1023 11/30/19 1426 11/30/19 1817 12/01/19 0626 12/01/19 0626 12/01/19 1316 12/05/19 1200 01/02/20 0832  NA 135   < > 133*   < > 134*   < > 134* 137 142  K 4.6   < > 3.9   < > 4.2   < > 4.2 4.5 5.0  CL 97*   < > 95*   < > 99   < > 100 104 105  CO2 29   < > 27   < > 30   < > '27 27 29  ' GLUCOSE 83   < > 115*   < > 98   < > 109* 83 132*  BUN 48*   < > 44*   < > 33*   < > 33* 30* 19  CREATININE 1.47*   < > 1.42*   < > 1.20*   < > 1.23* 1.28* 0.95  CALCIUM 7.1*   < > 6.9*   < > 7.0*   < > 7.2* 6.9* 8.7*  GFRNONAA 33*   < > 35*   < > 42*  --  41*  --  56*  GFRAA 38*   < > 40*   < > 49*  --  48*  --  >60  PROT 6.4*  --  6.5  --   --   --   --   --  6.5  ALBUMIN 3.5  --  3.5  --   --   --   --   --  3.5  AST 32  --  33  --   --   --   --   --  25  ALT 41  --  41  --   --   --   --   --  24  ALKPHOS 99  --  96  --   --   --   --   --  92  BILITOT 0.5  --  0.6  --   --   --   --   --  0.7   < > = values in this interval not displayed.   Iron/TIBC/Ferritin/ %Sat No results found for: IRON, TIBC, FERRITIN, IRONPCTSAT   RADIOGRAPHIC STUDIES: I have personally reviewed the radiological images as listed and agreed with the findings in the report. CT Abdomen Pelvis Wo Contrast  Result Date: 10/21/2019 CLINICAL DATA:  Lung cancer. Restaging. Also with a history of breast cancer. EXAM: CT CHEST, ABDOMEN AND PELVIS WITHOUT CONTRAST TECHNIQUE: Multidetector CT imaging of the chest, abdomen and pelvis was performed following the standard protocol without IV contrast. COMPARISON:  PET-CT 05/10/2019.  CT chest 03/23/2019. FINDINGS: CT CHEST FINDINGS Cardiovascular: The heart size is normal. No substantial pericardial effusion. Coronary artery calcification is evident. Atherosclerotic calcification is noted in the wall of the  thoracic aorta. Mediastinum/Nodes: No mediastinal lymphadenopathy. No evidence for gross hilar lymphadenopathy although assessment is limited by the lack of intravenous contrast on today's study. The esophagus has normal imaging features. No right axillary lymphadenopathy. Upper normal 10 mm short axis left axillary node stable since 03/23/2019. Lungs/Pleura: Left upper lobe pulmonary lesion measures 2.1 x 1.7 cm today, unchanged from 2.1 x 1.8 cm on 03/23/2019. Similar appearance of volume loss in the left upper lobe. Stable tiny nodules anterior right upper lobe on 35/4. 5 mm anterior right lung nodule on 53/4 is unchanged. Calcified granulomata again noted left upper lobe. Additional scattered tiny pulmonary nodules are stable. No new suspicious pulmonary nodule or mass. Peripheral airway impaction noted anterior left lower lobe (81/4) potentially related to atypical infection. No pleural effusion. Musculoskeletal: No worrisome lytic or sclerotic osseous abnormality. Multiple nonacute right-sided rib fractures noted. CT ABDOMEN PELVIS FINDINGS Hepatobiliary: No focal abnormality in the liver on this study without intravenous contrast. Gallbladder is surgically absent. No intrahepatic or extrahepatic biliary dilation. Pancreas: No focal mass lesion. No dilatation of the main duct. No intraparenchymal cyst. No peripancreatic edema. Spleen: No splenomegaly. No focal mass lesion. Adrenals/Urinary Tract: No adrenal nodule or mass. Unremarkable noncontrast appearance of the kidneys. No evidence for hydroureter. The urinary bladder appears normal for the degree of distention. Stomach/Bowel: Stomach is unremarkable. No gastric wall thickening. No evidence of outlet obstruction. Duodenum is normally positioned as is the ligament of Treitz. No small bowel wall thickening. No small bowel dilatation. Large stool volume noted in the distal transverse and descending/sigmoid colon. Vascular/Lymphatic: There is abdominal aortic  atherosclerosis without aneurysm. There is no gastrohepatic or hepatoduodenal ligament lymphadenopathy. No retroperitoneal or mesenteric lymphadenopathy. No pelvic sidewall lymphadenopathy. Reproductive: Unremarkable. Other: No intraperitoneal free fluid. Musculoskeletal: Old right pubic rami fractures noted. No worrisome lytic or sclerotic osseous abnormality. Similar appearance of the compression fracture at T11. Inferior endplate compression fracture at T9 is new in the interval. IMPRESSION: 1. Stable. No new or progressive findings. No change left upper lobe pulmonary lesion. 2. Scattered tiny bilateral pulmonary nodules, stable. 3. No evidence for metastatic disease in the abdomen or pelvis. 4. Large stool volume in the distal transverse and descending/sigmoid colon. Imaging features could be compatible with constipation in the appropriate clinical setting. 5. Inferior endplate compression fracture at T9, new in the interval with some lucency at the fracture site. Pathologic fracture cannot be completely excluded. 6. Aortic Atherosclerosis (ICD10-I70.0). Electronically Signed   By: Misty Stanley M.D.   On: 10/21/2019 13:29   CT Head Wo Contrast  Result Date: 10/31/2019 CLINICAL DATA:  Status post fall. EXAM: CT HEAD WITHOUT CONTRAST TECHNIQUE: Contiguous axial images were obtained from the base of the skull through the vertex without intravenous contrast. COMPARISON:  None. FINDINGS: Brain: There is mild cerebral atrophy with widening of the extra-axial spaces and ventricular dilatation. There are areas of decreased attenuation within the white matter tracts of the supratentorial brain, consistent with microvascular disease changes. Vascular: No hyperdense vessel or unexpected calcification. Skull: Normal. Negative for fracture or focal lesion. Sinuses/Orbits: No acute finding. Other: Moderate severity scalp soft tissue swelling, with an associated scalp hematoma, is seen along the posterior aspect of the  vertex on the left. A mild amount of scalp soft tissue air is also  noted. IMPRESSION: 1. Moderate severity scalp soft tissue swelling, with an associated scalp hematoma, seen along the posterior aspect of the vertex on the left. 2. No acute intracranial abnormality. Electronically Signed   By: Virgina Norfolk M.D.   On: 10/31/2019 19:25   CT Chest Wo Contrast  Result Date: 10/21/2019 CLINICAL DATA:  Lung cancer. Restaging. Also with a history of breast cancer. EXAM: CT CHEST, ABDOMEN AND PELVIS WITHOUT CONTRAST TECHNIQUE: Multidetector CT imaging of the chest, abdomen and pelvis was performed following the standard protocol without IV contrast. COMPARISON:  PET-CT 05/10/2019.  CT chest 03/23/2019. FINDINGS: CT CHEST FINDINGS Cardiovascular: The heart size is normal. No substantial pericardial effusion. Coronary artery calcification is evident. Atherosclerotic calcification is noted in the wall of the thoracic aorta. Mediastinum/Nodes: No mediastinal lymphadenopathy. No evidence for gross hilar lymphadenopathy although assessment is limited by the lack of intravenous contrast on today's study. The esophagus has normal imaging features. No right axillary lymphadenopathy. Upper normal 10 mm short axis left axillary node stable since 03/23/2019. Lungs/Pleura: Left upper lobe pulmonary lesion measures 2.1 x 1.7 cm today, unchanged from 2.1 x 1.8 cm on 03/23/2019. Similar appearance of volume loss in the left upper lobe. Stable tiny nodules anterior right upper lobe on 35/4. 5 mm anterior right lung nodule on 53/4 is unchanged. Calcified granulomata again noted left upper lobe. Additional scattered tiny pulmonary nodules are stable. No new suspicious pulmonary nodule or mass. Peripheral airway impaction noted anterior left lower lobe (81/4) potentially related to atypical infection. No pleural effusion. Musculoskeletal: No worrisome lytic or sclerotic osseous abnormality. Multiple nonacute right-sided rib fractures  noted. CT ABDOMEN PELVIS FINDINGS Hepatobiliary: No focal abnormality in the liver on this study without intravenous contrast. Gallbladder is surgically absent. No intrahepatic or extrahepatic biliary dilation. Pancreas: No focal mass lesion. No dilatation of the main duct. No intraparenchymal cyst. No peripancreatic edema. Spleen: No splenomegaly. No focal mass lesion. Adrenals/Urinary Tract: No adrenal nodule or mass. Unremarkable noncontrast appearance of the kidneys. No evidence for hydroureter. The urinary bladder appears normal for the degree of distention. Stomach/Bowel: Stomach is unremarkable. No gastric wall thickening. No evidence of outlet obstruction. Duodenum is normally positioned as is the ligament of Treitz. No small bowel wall thickening. No small bowel dilatation. Large stool volume noted in the distal transverse and descending/sigmoid colon. Vascular/Lymphatic: There is abdominal aortic atherosclerosis without aneurysm. There is no gastrohepatic or hepatoduodenal ligament lymphadenopathy. No retroperitoneal or mesenteric lymphadenopathy. No pelvic sidewall lymphadenopathy. Reproductive: Unremarkable. Other: No intraperitoneal free fluid. Musculoskeletal: Old right pubic rami fractures noted. No worrisome lytic or sclerotic osseous abnormality. Similar appearance of the compression fracture at T11. Inferior endplate compression fracture at T9 is new in the interval. IMPRESSION: 1. Stable. No new or progressive findings. No change left upper lobe pulmonary lesion. 2. Scattered tiny bilateral pulmonary nodules, stable. 3. No evidence for metastatic disease in the abdomen or pelvis. 4. Large stool volume in the distal transverse and descending/sigmoid colon. Imaging features could be compatible with constipation in the appropriate clinical setting. 5. Inferior endplate compression fracture at T9, new in the interval with some lucency at the fracture site. Pathologic fracture cannot be completely  excluded. 6. Aortic Atherosclerosis (ICD10-I70.0). Electronically Signed   By: Misty Stanley M.D.   On: 10/21/2019 13:29   CT Cervical Spine Wo Contrast  Result Date: 10/31/2019 CLINICAL DATA:  Status post fall. EXAM: CT CERVICAL SPINE WITHOUT CONTRAST TECHNIQUE: Multidetector CT imaging of the cervical spine was performed without intravenous contrast.  Multiplanar CT image reconstructions were also generated. COMPARISON:  None. FINDINGS: Alignment: There is approximately 2 mm anterolisthesis of the C3 on C4 vertebral body. Skull base and vertebrae: No acute fracture. No primary bone lesion or focal pathologic process. Soft tissues and spinal canal: No prevertebral fluid or swelling. No visible canal hematoma. Disc levels: Moderate severity endplate sclerosis is seen at the levels of C4-C5, C5-C6, C6-C7 and C7-T1. Moderate to marked severity intervertebral disc space narrowing is also seen at these levels. Moderate severity bilateral multilevel facet joint hypertrophy is seen. Upper chest: A 2.4 cm x 1.7 cm x 1.9 cm low-attenuation lung mass is seen within the anteromedial aspect of the left apex. A chronic posterior third right rib fracture is seen. Other: None. IMPRESSION: 1. No acute fracture within the cervical spine. 2. Moderate severity multilevel degenerative disc disease and facet joint hypertrophy. 3. 2.4 cm x 1.7 cm x 1.9 cm low-attenuation lung mass is seen within the anteromedial aspect of the left apex. This finding is concerning for the presence of a primary lung malignancy. Further evaluation with a nonemergent chest CT is recommended. Electronically Signed   By: Virgina Norfolk M.D.   On: 10/31/2019 19:29   MR Brain W Wo Contrast  Result Date: 10/23/2019 CLINICAL DATA:  Breast and lung cancer follow-up EXAM: MRI HEAD WITHOUT AND WITH CONTRAST TECHNIQUE: Multiplanar, multiecho pulse sequences of the brain and surrounding structures were obtained without and with intravenous contrast. CONTRAST:   65m GADAVIST GADOBUTROL 1 MMOL/ML IV SOLN COMPARISON:  None. FINDINGS: Brain: No acute infarct, acute hemorrhage or extra-axial collection. Multifocal white matter hyperintensity, most commonly due to chronic ischemic microangiopathy. There is generalized atrophy without lobar predilection. No chronic microhemorrhage. Normal midline structures. There is no abnormal contrast enhancement. Vascular: Normal flow voids. Skull and upper cervical spine: Normal marrow signal. Sinuses/Orbits: Negative. Other: None. IMPRESSION: 1. No intracranial metastatic disease. 2. Generalized atrophy and chronic ischemic microangiopathy. Electronically Signed   By: KUlyses JarredM.D.   On: 10/23/2019 04:02   DG HIP UNILAT WITH PELVIS 1V LEFT  Result Date: 11/30/2019 CLINICAL DATA:  Left hip pain EXAM: DG HIP (WITH OR WITHOUT PELVIS) 1V*L* COMPARISON:  09/21/2019 FINDINGS: Alignment at the left hip is anatomic. There is no acute fracture identified. Joint space is preserved. Chronic fractures of the right inferior and superior pubic rami again noted. IMPRESSION: No acute osseous abnormality. Electronically Signed   By: PMacy MisM.D.   On: 11/30/2019 17:58      ASSESSMENT & PLAN:  1. Malignant neoplasm of left female breast, unspecified estrogen receptor status, unspecified site of breast ([place]   2. Primary adenocarcinoma of lung, unspecified laterality ([place]   3. Encounter for antineoplastic chemotherapy   4. Aromatase inhibitor use   5. Lower leg edema   6. Other constipation   Cancer Staging Malignant neoplasm of left female breast ([facility] Staging form: Breast, AJCC 8th Edition - Clinical stage from 12/17/2018: Stage IB (cT1b, cN1, cM0, G1, ER+, PR+, HER2-) - Signed by YEarlie Server MD on 12/17/2018  Primary adenocarcinoma of lung ([facility] Staging form: Lung, AJCC 8th Edition - Clinical stage from 09/28/2018: Stage IVA (cT2, cN3, cM1a) - Signed by YEarlie Server MD on 09/28/2018   Patient has 2 primaries.   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   Repeat surveillance scan prior to  next visit Continue crizotinib and Arimidex Labs were reviewed and discussed with patient.  #hypocalcemia, has improved. Continue calcium supplementation and vitamin D supplementation.  #Osteopenia, stage IV lung cancer Proceed with Xgeva today. #Constipation, continue MiraLAX daily as needed.  Recommend patient to try Colace 100 mg daily.  Follow-up 1 months All questions were answered. The patient knows to call the clinic with any problems questions or concerns.  Earlie Server, MD, PhD 01/02/2020

## 2020-01-03 ENCOUNTER — Ambulatory Visit (INDEPENDENT_AMBULATORY_CARE_PROVIDER_SITE_OTHER): Payer: Medicare Other | Admitting: Family Medicine

## 2020-01-03 VITALS — BP 100/60 | HR 75 | Temp 98.2°F | Ht 63.0 in | Wt 138.2 lb

## 2020-01-03 DIAGNOSIS — R278 Other lack of coordination: Secondary | ICD-10-CM | POA: Diagnosis not present

## 2020-01-03 DIAGNOSIS — G8929 Other chronic pain: Secondary | ICD-10-CM | POA: Diagnosis not present

## 2020-01-03 DIAGNOSIS — M25561 Pain in right knee: Secondary | ICD-10-CM | POA: Diagnosis not present

## 2020-01-03 DIAGNOSIS — R2689 Other abnormalities of gait and mobility: Secondary | ICD-10-CM | POA: Diagnosis not present

## 2020-01-03 DIAGNOSIS — R296 Repeated falls: Secondary | ICD-10-CM | POA: Diagnosis not present

## 2020-01-03 DIAGNOSIS — M6281 Muscle weakness (generalized): Secondary | ICD-10-CM | POA: Diagnosis not present

## 2020-01-03 NOTE — Patient Instructions (Addendum)
Curcumin or Tumeric  Research has shown that Curcumin (Tumeric) supplements are just as good as high dose anti-inflammatory medications (like diclofenac and ibuprofen) at treating pain from osteoarthritis without the side effects.   Take Curcumin 500 mg Three times daily   -- You can find a bottle at [employer] for $8 which should last a month -- [place] is around $9 and is available at [facility]   Would recommend doing the exercises for your left knee on your right side too  Could also try Voltaren Topical pain reliever    Constipation - continue miralax daily - add colace daily - if no improvement - increase in stepwise matter to twice daily for each medicine until you are going regularly - can also try using prunes daily

## 2020-01-03 NOTE — Telephone Encounter (Signed)
I have collaborated with the care management provider regarding care management and care coordination activities outlined in this encounter and have reviewed this encounter including documentation in the note and care plan. I am certifying that I agree with the content of this note and encounter as supervising physician.  

## 2020-01-03 NOTE — Assessment & Plan Note (Signed)
Encouraged trial of tumeric for pain as well as voltaren gel. Referral for PT - as being seen for left knee as well. Not interested in injections. Suspect arthritis. F/u if not improving with PT or interested in injections.

## 2020-01-03 NOTE — Progress Notes (Signed)
Subjective:     Jamie Matthews is a 79 y.o. female presenting for Knee Pain (right x 2 months )     HPI   #Right knee pain - symptoms x 2 months - no treatment currently - is in PT for the left knee w/ improvement - has not been doing exercises on right side - no known injury - has had falls but none that she recalls specifically impacting the right knee - front of knee and radiating down the lateral side - worse with weight bearing activity - no popping/locking - using walker due to knee pain/stablity issues - working with PT x 2 weeks   Review of Systems   Social History   Tobacco Use  Smoking Status Never Smoker  Smokeless Tobacco Never Used        Objective:    BP Readings from Last 3 Encounters:  01/03/20 100/60  01/02/20 (!) 112/56  12/05/19 120/60   Wt Readings from Last 3 Encounters:  01/03/20 138 lb 4 oz (62.7 kg)  01/02/20 141 lb 9.6 oz (64.2 kg)  12/05/19 147 lb 12 oz (67 kg)    BP 100/60    Pulse 75    Temp 98.2 F (36.8 C) (Temporal)    Ht 5\' 3"  (1.6 m)    Wt 138 lb 4 oz (62.7 kg)    SpO2 98%    BMI 24.49 kg/m    Physical Exam Constitutional:      General: She is not in acute distress.    Appearance: She is well-developed. She is not diaphoretic.  HENT:     Right Ear: External ear normal.     Left Ear: External ear normal.     Nose: Nose normal.  Eyes:     Conjunctiva/sclera: Conjunctivae normal.  Cardiovascular:     Rate and Rhythm: Normal rate.  Pulmonary:     Effort: Pulmonary effort is normal.  Musculoskeletal:     Cervical back: Neck supple.     Comments: Right knee Inspection: no erythema, no swelling Palpation: TTP along the joint line and above and below the knee along muscles of the leg ROM: limited extension by 5 degrees. Normal flexion Strength: normal Ligaments: normal, no laxity   Skin:    General: Skin is warm and dry.     Capillary Refill: Capillary refill takes less than 2 seconds.    Neurological:     Mental Status: She is alert. Mental status is at baseline.  Psychiatric:        Mood and Affect: Mood normal.        Behavior: Behavior normal.           Assessment & Plan:   Problem List Items Addressed This Visit      Other   Chronic pain of right knee - Primary    Encouraged trial of tumeric for pain as well as voltaren gel. Referral for PT - as being seen for left knee as well. Not interested in injections. Suspect arthritis. F/u if not improving with PT or interested in injections.       Relevant Orders   Ambulatory referral to Physical Therapy       Return if symptoms worsen or fail to improve.  [name], MD  This visit occurred during the SARS-CoV-2 public health emergency.  Safety protocols were in place, including screening questions prior to the visit, additional usage of staff PPE, and extensive cleaning of exam room while observing appropriate  contact time as indicated for disinfecting solutions.

## 2020-01-11 ENCOUNTER — Other Ambulatory Visit: Payer: Self-pay

## 2020-01-11 ENCOUNTER — Ambulatory Visit (INDEPENDENT_AMBULATORY_CARE_PROVIDER_SITE_OTHER): Payer: Medicare Other

## 2020-01-11 DIAGNOSIS — Z0000 Encounter for general adult medical examination without abnormal findings: Secondary | ICD-10-CM

## 2020-01-11 NOTE — Patient Instructions (Signed)
Jamie Matthews , Thank you for taking time to come for your Medicare Wellness Visit. I appreciate your ongoing commitment to your health goals. Please review the following plan we discussed and let me know if I can assist you in the future.   Screening recommendations/referrals: Colonoscopy: due, will discuss with provider at upcoming physical. Mammogram: due, needs order placed and will call to have this setup. Provider notified. Bone Density: due, needs order placed and will call to have this setup. Provider notified. Recommended yearly ophthalmology/optometry visit for glaucoma screening and checkup Recommended yearly dental visit for hygiene and checkup  Vaccinations: Influenza vaccine: due, will be available in the office at the end of August Pneumococcal vaccine: Completed series Tdap vaccine: Up to date, completed 08/17/2019, due 07/2029 Shingles vaccine: due, check with your insurance company regarding coverage    Covid-19: Completed series  Advanced directives: copy in chart  Conditions/risks identified: hyperlipidemia  Next appointment: Follow up in one year for your annual wellness visit    Preventive Care 65 Years and Older, Female Preventive care refers to lifestyle choices and visits with your health care provider that can promote health and wellness. What does preventive care include?  A yearly physical exam. This is also called an annual well check.  Dental exams once or twice a year.  Routine eye exams. Ask your health care provider how often you should have your eyes checked.  Personal lifestyle choices, including:  Daily care of your teeth and gums.  Regular physical activity.  Eating a healthy diet.  Avoiding tobacco and drug use.  Limiting alcohol use.  Practicing safe sex.  Taking low-dose aspirin every day.  Taking vitamin and mineral supplements as recommended by your health care provider. What happens during an annual well check? The services  and screenings done by your health care provider during your annual well check will depend on your age, overall health, lifestyle risk factors, and family history of disease. Counseling  Your health care provider may ask you questions about your:  Alcohol use.  Tobacco use.  Drug use.  Emotional well-being.  Home and relationship well-being.  Sexual activity.  Eating habits.  History of falls.  Memory and ability to understand (cognition).  Work and work Statistician.  Reproductive health. Screening  You may have the following tests or measurements:  Height, weight, and BMI.  Blood pressure.  Lipid and cholesterol levels. These may be checked every 5 years, or more frequently if you are over 20 years old.  Skin check.  Lung cancer screening. You may have this screening every year starting at age 25 if you have a 30-pack-year history of smoking and currently smoke or have quit within the past 15 years.  Fecal occult blood test (FOBT) of the stool. You may have this test every year starting at age 65.  Flexible sigmoidoscopy or colonoscopy. You may have a sigmoidoscopy every 5 years or a colonoscopy every 10 years starting at age 64.  Hepatitis C blood test.  Hepatitis B blood test.  Sexually transmitted disease (STD) testing.  Diabetes screening. This is done by checking your blood sugar (glucose) after you have not eaten for a while (fasting). You may have this done every 1-3 years.  Bone density scan. This is done to screen for osteoporosis. You may have this done starting at age 15.  Mammogram. This may be done every 1-2 years. Talk to your health care provider about how often you should have regular mammograms. Talk with  your health care provider about your test results, treatment options, and if necessary, the need for more tests. Vaccines  Your health care provider may recommend certain vaccines, such as:  Influenza vaccine. This is recommended every  year.  Tetanus, diphtheria, and acellular pertussis (Tdap, Td) vaccine. You may need a Td booster every 10 years.  Zoster vaccine. You may need this after age 32.  Pneumococcal 13-valent conjugate (PCV13) vaccine. One dose is recommended after age 71.  Pneumococcal polysaccharide (PPSV23) vaccine. One dose is recommended after age 79. Talk to your health care provider about which screenings and vaccines you need and how often you need them. This information is not intended to replace advice given to you by your health care provider. Make sure you discuss any questions you have with your health care provider. Document Released: 06/08/2015 Document Revised: 01/30/2016 Document Reviewed: 03/13/2015 Elsevier Interactive Patient Education  2017 [place] Prevention in the Home Falls can cause injuries. They can happen to people of all ages. There are many things you can do to make your home safe and to help prevent falls. What can I do on the outside of my home?  Regularly fix the edges of walkways and driveways and fix any cracks.  Remove anything that might make you trip as you walk through a door, such as a raised step or threshold.  Trim any bushes or trees on the path to your home.  Use bright outdoor lighting.  Clear any walking paths of anything that might make someone trip, such as rocks or tools.  Regularly check to see if handrails are loose or broken. Make sure that both sides of any steps have handrails.  Any raised decks and porches should have guardrails on the edges.  Have any leaves, snow, or ice cleared regularly.  Use sand or salt on walking paths during winter.  Clean up any spills in your garage right away. This includes oil or grease spills. What can I do in the bathroom?  Use night lights.  Install grab bars by the toilet and in the tub and shower. Do not use towel bars as grab bars.  Use non-skid mats or decals in the tub or shower.  If you  need to sit down in the shower, use a plastic, non-slip stool.  Keep the floor dry. Clean up any water that spills on the floor as soon as it happens.  Remove soap buildup in the tub or shower regularly.  Attach bath mats securely with double-sided non-slip rug tape.  Do not have throw rugs and other things on the floor that can make you trip. What can I do in the bedroom?  Use night lights.  Make sure that you have a light by your bed that is easy to reach.  Do not use any sheets or blankets that are too big for your bed. They should not hang down onto the floor.  Have a firm chair that has side arms. You can use this for support while you get dressed.  Do not have throw rugs and other things on the floor that can make you trip. What can I do in the kitchen?  Clean up any spills right away.  Avoid walking on wet floors.  Keep items that you use a lot in easy-to-reach places.  If you need to reach something above you, use a strong step stool that has a grab bar.  Keep electrical cords out of the way.  Do not  use floor polish or wax that makes floors slippery. If you must use wax, use non-skid floor wax.  Do not have throw rugs and other things on the floor that can make you trip. What can I do with my stairs?  Do not leave any items on the stairs.  Make sure that there are handrails on both sides of the stairs and use them. Fix handrails that are broken or loose. Make sure that handrails are as long as the stairways.  Check any carpeting to make sure that it is firmly attached to the stairs. Fix any carpet that is loose or worn.  Avoid having throw rugs at the top or bottom of the stairs. If you do have throw rugs, attach them to the floor with carpet tape.  Make sure that you have a light switch at the top of the stairs and the bottom of the stairs. If you do not have them, ask someone to add them for you. What else can I do to help prevent falls?  Wear shoes  that:  Do not have high heels.  Have rubber bottoms.  Are comfortable and fit you well.  Are closed at the toe. Do not wear sandals.  If you use a stepladder:  Make sure that it is fully opened. Do not climb a closed stepladder.  Make sure that both sides of the stepladder are locked into place.  Ask someone to hold it for you, if possible.  Clearly mark and make sure that you can see:  Any grab bars or handrails.  First and last steps.  Where the edge of each step is.  Use tools that help you move around (mobility aids) if they are needed. These include:  Canes.  Walkers.  Scooters.  Crutches.  Turn on the lights when you go into a dark area. Replace any light bulbs as soon as they burn out.  Set up your furniture so you have a clear path. Avoid moving your furniture around.  If any of your floors are uneven, fix them.  If there are any pets around you, be aware of where they are.  Review your medicines with your doctor. Some medicines can make you feel dizzy. This can increase your chance of falling. Ask your doctor what other things that you can do to help prevent falls. This information is not intended to replace advice given to you by your health care provider. Make sure you discuss any questions you have with your health care provider. Document Released: 03/08/2009 Document Revised: 10/18/2015 Document Reviewed: 06/16/2014 Elsevier Interactive Patient Education  2017 [employer].

## 2020-01-11 NOTE — Progress Notes (Signed)
Subjective:   Jamie Matthews is a 79 y.o. female who presents for Medicare Annual (Subsequent) preventive examination.  Review of Systems: N/A      I connected with the patient today by telephone and verified that I am speaking with the correct person using two identifiers. Location patient: home Location nurse: work Persons participating in the virtual visit: patient, Marine scientist.   I discussed the limitations, risks, security and privacy concerns of performing an evaluation and management service by telephone and the availability of in person appointments. I also discussed with the patient that there may be a patient responsible charge related to this service. The patient expressed understanding and verbally consented to this telephonic visit.    Interactive audio and video telecommunications were attempted between this nurse and patient, however failed, due to patient having technical difficulties OR patient did not have access to video capability.  We continued and completed visit with audio only.     Cardiac Risk Factors include: advanced age (>42men, >57 women);dyslipidemia     Objective:    Today's Vitals   01/11/20 1113  PainSc: 8    There is no height or weight on file to calculate BMI.  Advanced Directives 01/11/2020 01/02/2020 12/01/2019 11/30/2019 11/30/2019 10/31/2019 10/07/2019  Does Patient Have a Medical Advance Directive? Yes Yes Yes No Yes No Yes  Type of Paramedic of [place];Living will Living will;Healthcare Power of [place];Living will [place];Living will [place];Living will - [place]  Does patient want to make changes to medical advance directive? - - No - Patient declined - - - -  Copy of [place] in Chart? Yes - validated most recent copy scanned in chart (See row information) No - copy requested No - copy requested - - - -    Would patient like information on creating a medical advance directive? - - - - - No - Patient declined -    Current Medications (verified) Outpatient Encounter Medications as of 01/11/2020  Medication Sig  . acetaminophen (TYLENOL) 325 MG tablet Take 325 mg by mouth every 6 (six) hours as needed for mild pain.   [name] anastrozole (ARIMIDEX) 1 MG tablet TAKE 1 TABLET BY MOUTH DAILY  . atorvastatin (LIPITOR) 20 MG tablet Take 1 tablet (20 mg total) by mouth at bedtime.  [name] buPROPion (WELLBUTRIN XL) 150 MG 24 hr tablet TAKE ONE TABLET BY MOUTH EVERY DAY (Patient taking differently: Take 150 mg by mouth daily. )  . Calcium-Magnesium-Vitamin D (CALCIUM 1200+D3 PO) Take 1 tablet by mouth daily.  [name] desonide (DESOWEN) 0.05 % lotion Apply 1 application topically as needed (after showering).   . Dextromethorphan-guaiFENesin (MUCINEX DM) 30-600 MG TB12 Take 1 tablet by mouth 2 (two) times daily as needed (for congestion/cough).   . docusate sodium (COLACE) 100 MG capsule Take 1 capsule (100 mg total) by mouth daily.  . fluticasone (FLONASE) 50 MCG/ACT nasal spray Place 2 sprays into both nostrils daily.  [name] ketoconazole (NIZORAL) 2 % shampoo Apply 1 application topically. 3 times a week  . levothyroxine (SYNTHROID) 25 MCG tablet Take 1 tablet (25 mcg total) by mouth daily.  [name] lubiprostone (AMITIZA) 24 MCG capsule TAKE ONE CAPSULE TWICE A DAY WITH MEALS (Patient taking differently: Take 24 mcg by mouth 2 (two) times daily with a meal. TAKE ONE CAPSULE TWICE A DAY WITH MEALS)  . memantine (NAMENDA) 10 MG tablet TAKE ONE TABLET  BY MOUTH TWICE DAILY (Patient taking differently: Take 10 mg by mouth 2 (two) times daily. )  . mirabegron ER (MYRBETRIQ) 50 MG TB24 tablet Take 1 tablet (50 mg total) by mouth daily.  . Multiple Vitamin (MULTIVITAMIN WITH MINERALS) TABS tablet Take 1 tablet by mouth daily.  [name] omeprazole (PRILOSEC) 40 MG capsule Take 40 mg by mouth daily.  . ondansetron (ZOFRAN) 8 MG tablet TAKE ONE  TABLET EVERY EIGHT HOURS AS NEEDED FOR NAUSEA / VOMITING (Patient taking differently: Take 8 mg by mouth every 8 (eight) hours as needed for nausea or vomiting. )  . polyethylene glycol (MIRALAX / GLYCOLAX) packet Take 17 g by mouth daily.  . sucralfate (CARAFATE) 1 g tablet Take 1 tablet (1 g total) by mouth 2 (two) times daily.  [name] triamcinolone (NASACORT) 55 MCG/ACT AERO nasal inhaler Place 2 sprays into the nose daily as needed (for congestion).   . venlafaxine XR (EFFEXOR-XR) 150 MG 24 hr capsule TAKE 1 CAPSULE BY MOUTH EVERY DAY  . Vitamin D, Ergocalciferol, (DRISDOL) 1.25 MG (50000 UNIT) CAPS capsule Take 50,000 Units by mouth once a week.  [name] XALKORI 250 MG capsule TAKE 1 CAPSULE (250 MG TOTAL) BY MOUTH 2 TIMES DAILY.   No facility-administered encounter medications on file as of 01/11/2020.    Allergies (verified) Sulfa antibiotics   History: Past Medical History:  Diagnosis Date  . AKI (acute kidney injury) ([place]) 08/17/2018  . Anxiety   . Arthritis   . Belching   . Bladder disorder    OVERACTIVE  . Bowel dysfunction    BLOCKAGE  . Cancer (HCC)    breast  . Constipation   . Depression   . Diverticulitis   . Fibromyalgia   . GERD (gastroesophageal reflux disease)   . Hyperlipidemia   . IBS (irritable bowel syndrome)   . Internal hemorrhoids   . Lung cancer ([place]) 2020  . Memory deficits   . Murmur    asymptomatic  . Pneumonia 11/18/12  . Urinary incontinence   . Vertigo    Past Surgical History:  Procedure Laterality Date  . AXILLARY LYMPH NODE BIOPSY Left 06/08/2018   INVASIVE MAMMARY CARCINOMA  . BLADDER SUSPENSION  2004, 2012  . BREAST BIOPSY Left 06/08/2018   INVASIVE MAMMARY CARCINOMA  . CATARACT EXTRACTION W/PHACO Right 08/27/2015   Procedure: CATARACT EXTRACTION PHACO AND INTRAOCULAR LENS PLACEMENT (IOC);  Surgeon: Estill Cotta, MD;  Location: [facility] ORS;  Service: Ophthalmology;  Laterality: Right;  [place]   1:00.2 AP%  22.5 CDE  23.67 fluid casette lot  #[id number]  exp05/31/2018  . CATARACT EXTRACTION W/PHACO Left 10/15/2015   Procedure: CATARACT EXTRACTION PHACO AND INTRAOCULAR LENS PLACEMENT (IOC);  Surgeon: Estill Cotta, MD;  Location: [facility] ORS;  Service: Ophthalmology;  Laterality: Left;  [place] 01:07 AP% 18.1 CDE 21.57 fluid pack lot # [id number]  . CHOLECYSTECTOMY    . COLONOSCOPY  2017  . ELECTROMAGNETIC NAVIGATION BROCHOSCOPY N/A 07/09/2018   Procedure: ELECTROMAGNETIC NAVIGATION BRONCHOSCOPY;  Surgeon: Flora Lipps, MD;  Location: [facility] ORS;  Service: Cardiopulmonary;  Laterality: N/A;  . TONSILLECTOMY  1947   Family History  Problem Relation Age of Onset  . Diabetes Father   . Heart disease Father   . Lymphoma Father   . Heart disease Mother   . Breast cancer Sister 82  . Colon cancer Neg Hx   . Esophageal cancer Neg Hx   . Rectal cancer Neg Hx   . Stomach cancer Neg Hx   . Bladder Cancer  Neg Hx   . Kidney cancer Neg Hx    Social History   Socioeconomic History  . Marital status: Married    Spouse name: Bennye Alm  . Number of children: 0  . Years of education: Master's Degree  . Highest education level: Not on file  Occupational History  . Occupation: Retired  Tobacco Use  . Smoking status: Never Smoker  . Smokeless tobacco: Never Used  Vaping Use  . Vaping Use: Never used  Substance and Sexual Activity  . Alcohol use: Not Currently    Comment: rare wine  . Drug use: No  . Sexual activity: Not Currently  Other Topics Concern  . Not on file  Social History Narrative   Recently moved with her husband to [place] from [place].   Husband is a retired Pharmacist, community.   No children.   She is a retired Pharmacist, hospital.   Enjoys: Chief Technology Officer, reading - mysteries and biographies, cooking   Exercise: walking, gardening   Diet: low appetite due to cancer treatment, grazing   She is a DNR.   Social Determinants of Health   Financial Resource Strain: Low Risk   . Difficulty of Paying Living Expenses: Not hard at all  Food  Insecurity: No Food Insecurity  . Worried About Charity fundraiser in the Last Year: Never true  . Ran Out of Food in the Last Year: Never true  Transportation Needs: No Transportation Needs  . Lack of Transportation (Medical): No  . Lack of Transportation (Non-Medical): No  Physical Activity: Inactive  . Days of Exercise per Week: 0 days  . Minutes of Exercise per Session: 0 min  Stress: No Stress Concern Present  . Feeling of Stress : Not at all  Social Connections:   . Frequency of Communication with Friends and Family:   . Frequency of Social Gatherings with Friends and Family:   . Attends Religious Services:   . Active Member of Clubs or Organizations:   . Attends Archivist Meetings:   [name] Marital Status:     Tobacco Counseling Counseling given: Not Answered   Clinical Intake:  Pre-visit preparation completed: Yes  Pain : 0-10 Pain Score: 8  Pain Type: Chronic pain Pain Location: Knee Pain Orientation: Right Pain Descriptors / Indicators: Aching Pain Onset: More than a month ago Pain Frequency: Intermittent Pain Relieving Factors: none  Pain Relieving Factors: none  Nutritional Risks: None Diabetes: No  How often do you need to have someone help you when you read instructions, pamphlets, or other written materials from your doctor or pharmacy?: 1 - Never What is the last grade level you completed in school?: Masters  Diabetic: No Nutrition Risk Assessment:  Has the patient had any N/V/D within the last 2 months?  No  Does the patient have any non-healing wounds?  No  Has the patient had any unintentional weight loss or weight gain?  No   Diabetes:  Is the patient diabetic?  No  If diabetic, was a CBG obtained today?  N/A Did the patient bring in their glucometer from home?  N/A How often do you monitor your CBG's? N/A.   Financial Strains and Diabetes Management:  Are you having any financial strains with the device, your supplies or your  medication? N/A.  Does the patient want to be seen by Chronic Care Management for management of their diabetes?  N/A Would the patient like to be referred to a Nutritionist or for Diabetic Management?  N/A  Interpreter Needed?: No  Information entered by :: CJohnson, LPN   Activities of Daily Living In your present state of health, do you have any difficulty performing the following activities: 01/11/2020 12/01/2019  Hearing? N N  Vision? N N  Difficulty concentrating or making decisions? N N  Walking or climbing stairs? N N  Dressing or bathing? N N  Doing errands, shopping? N N  Preparing Food and eating ? N -  Using the Toilet? N -  In the past six months, have you accidently leaked urine? N -  Do you have problems with loss of bowel control? N -  Managing your Medications? N -  Managing your Finances? N -  Housekeeping or managing your Housekeeping? N -  Some recent data might be hidden    Patient Care Team: [name], MD as PCP - General (Family Medicine) Osborne Oman, MD as Consulting Physician (Obstetrics and Gynecology) Pyrtle, Lajuan Lines, MD as Consulting Physician (Gastroenterology) Telford Nab, RN as Registered Nurse Earlie Server, MD as Consulting Physician (Hematology and Oncology) Debbora Dus, [facility] as Pharmacist (Pharmacist) Burnice Logan, [facility] as Pharmacist (Pharmacist)  Indicate any recent Medical Services you may have received from other than [facility] providers in the past year (date may be approximate).     Assessment:   This is a routine wellness examination for [facility].  Hearing/Vision screen  Hearing Screening   125Hz  250Hz  500Hz  1000Hz  2000Hz  3000Hz  4000Hz  6000Hz  8000Hz   Right ear:           Left ear:           Vision Screening Comments: Patient gets annual eye exams   Dietary issues and exercise activities discussed: Current Exercise Habits: The patient does not participate in regular exercise at present, Exercise limited by: None  identified  Goals    . Increase physical activity     Starting 10/14/2016, I will continue to walk at least 30 min daily.     . Maintain healthy diet    . Patient Stated     01/11/2020, I will maintain and continue medications as prescribed.       Depression Screen PHQ 2/9 Scores 01/11/2020 11/11/2017 09/28/2017 06/09/2017 10/14/2016 07/18/2015 12/13/2013  PHQ - 2 Score 0 1 2 0 1 1 1   PHQ- 9 Score 0 - 11 - - - -    Fall Risk Fall Risk  01/11/2020 06/16/2018 11/11/2017 06/09/2017 10/14/2016  Falls in the past year? 1 1 No No Yes  Comment fell backwards - - - -  Number falls in past yr: 1 1 - - 2 or more  Comment - - - - -  Injury with Fall? 1 0 - - No  Comment laceration to head - - - -  Risk for fall due to : Impaired mobility;Medication side effect History of fall(s) - - -  Follow up Falls evaluation completed;Falls prevention discussed Falls evaluation completed;Falls prevention discussed - - -    Any stairs in or around the home? Yes  If so, are there any without handrails? No  Home free of loose throw rugs in walkways, pet beds, electrical cords, etc? Yes  Adequate lighting in your home to reduce risk of falls? Yes   ASSISTIVE DEVICES UTILIZED TO PREVENT FALLS:  Life alert? No  Use of a cane, walker or w/c? Yes  Grab bars in the bathroom? No  Shower chair or bench in shower? No  Elevated toilet seat or a handicapped toilet? No  TIMED UP AND GO:  Was the test performed? N/A, telephonic visit .    Cognitive Function: MMSE - Mini Mental State Exam 01/11/2020 11/11/2017 10/14/2016  Orientation to time 5 5 5   Orientation to Place 5 5 5   Registration 3 3 3   Attention/ Calculation 5 4 0  Recall 3 2 2   Recall-comments - - pt was unable to recall 1 of 3 words  Language- name 2 objects - 2 0  Language- repeat 1 1 1   Language- follow 3 step command - 3 3  Language- read & follow direction - 1 0  Write a sentence - 1 0  Copy design - 1 0  Total score - 28 19  Mini Cog  Mini-Cog  screen was completed. Maximum score is 22. A value of 0 denotes this part of the MMSE was not completed or the patient failed this part of the Mini-Cog screening.       Immunizations Immunization History  Administered Date(s) Administered  . Fluad Quad(high Dose 65+) 02/01/2019  . Influenza Split 02/05/2012  . Influenza, High Dose Seasonal PF 03/23/2018  . Influenza,inj,Quad PF,6+ Mos 02/15/2015, 02/24/2017  . Influenza-Unspecified 02/23/2013  . Moderna SARS-COVID-2 Vaccination 08/05/2019, 09/17/2019  . Pneumococcal Conjugate-13 12/13/2013  . Pneumococcal Polysaccharide-23 07/18/2015  . Tdap 08/17/2019    TDAP status: Up to date Flu Vaccine status: due, will be available in the office at the end of August Pneumococcal vaccine status: Up to date Covid-19 vaccine status: Completed vaccines  Qualifies for Shingles Vaccine? Yes   Zostavax completed No   Shingrix Completed?: No.    Education has been provided regarding the importance of this vaccine. Patient has been advised to call insurance company to determine out of pocket expense if they have not yet received this vaccine. Advised may also receive vaccine at local pharmacy or Health Dept. Verbalized acceptance and understanding.  Screening Tests Health Maintenance  Topic Date Due  . COLONOSCOPY  02/14/2019  . MAMMOGRAM  05/06/2019  . INFLUENZA VACCINE  12/25/2019  . TETANUS/TDAP  08/16/2029  . DEXA SCAN  Completed  . COVID-19 Vaccine  Completed  . PNA vac Low Risk Adult  Completed    Health Maintenance  Health Maintenance Due  Topic Date Due  . COLONOSCOPY  02/14/2019  . MAMMOGRAM  05/06/2019  . INFLUENZA VACCINE  12/25/2019    Colorectal cancer screening: due, will discuss with provider at upcoming physical first Mammogram status: due, needs order placed and will call to have this setup. Provider notified. Bone Density status: due, needs order placed and will call to have this setup. Provider notified.   Lung  Cancer Screening: (Low Dose CT Chest recommended if Age 79-80 years, 30 pack-year currently smoking OR have quit w/in 15years.) does not qualify.   Additional Screening:  Hepatitis C Screening: does not qualify; Completed N/A  Vision Screening: Recommended annual ophthalmology exams for early detection of glaucoma and other disorders of the eye. Is the patient up to date with their annual eye exam?  Yes  Who is the provider or what is the name of the office in which the patient attends annual eye exams? [facility] If pt is not established with a provider, would they like to be referred to a provider to establish care? No .   Dental Screening: Recommended annual dental exams for proper oral hygiene  Community Resource Referral / Chronic Care Management: CRR required this visit?  No   CCM required this visit?  No  Plan:     I have personally reviewed and noted the following in the patient's chart:   . Medical and social history . Use of alcohol, tobacco or illicit drugs  . Current medications and supplements . Functional ability and status . Nutritional status . Physical activity . Advanced directives . List of other physicians . Hospitalizations, surgeries, and ER visits in previous 12 months . Vitals . Screenings to include cognitive, depression, and falls . Referrals and appointments  In addition, I have reviewed and discussed with patient certain preventive protocols, quality metrics, and best practice recommendations. A written personalized care plan for preventive services as well as general preventive health recommendations were provided to patient.   Due to this being a telephonic visit, the after visit summary with patients personalized plan was offered to patient via mail or my-chart. Patient preferred to pick up at office at next visit.   Andrez Grime, LPN   6/72/5500

## 2020-01-11 NOTE — Progress Notes (Signed)
PCP notes:  Health Maintenance: Colonoscopy- due Mammogram- due Dexa- due   Abnormal Screenings: none   Patient concerns: Constipation issues   Nurse concerns: none   Next PCP appt.: 01/18/2020 @ 11:20 am

## 2020-01-16 IMAGING — MR MR HEAD WO/W CM
15 series · 48 of 48 positions shown · IV contrast (gadavist)
Comparison: 07/20/2018

CLINICAL DATA: Breast and lung cancer.  Restaging.

EXAM:
MRI HEAD WITHOUT AND WITH CONTRAST
TECHNIQUE: Multiplanar, multiecho pulse sequences of the brain and surrounding
structures were obtained without and with intravenous contrast.
CONTRAST:  6mL GADAVIST GADOBUTROL 1 MMOL/ML IV SOLN

[Series 5: ax dwi_tracew · axial · 3.0mm · 0.60mm/px · z∈[-86,+61]mm · 4 of 46 slices shown]
[im 1/46]
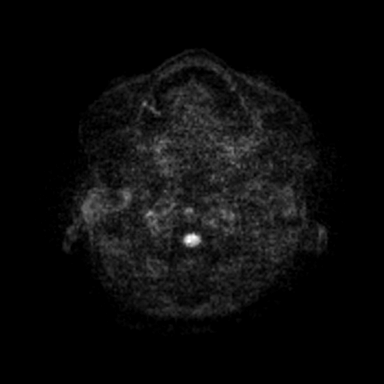
[im 16/46]
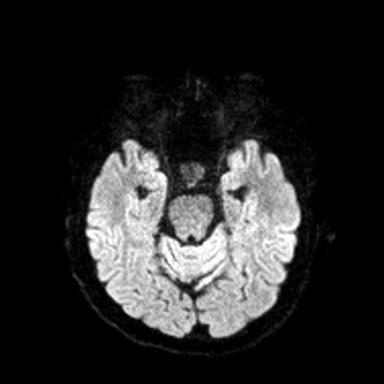
[im 31/46]
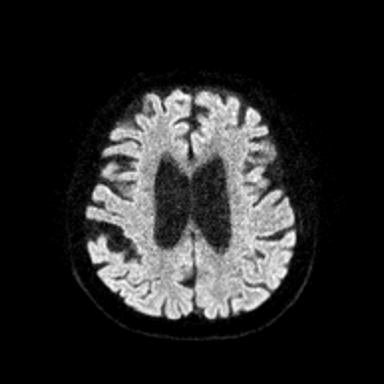
[im 46/46]
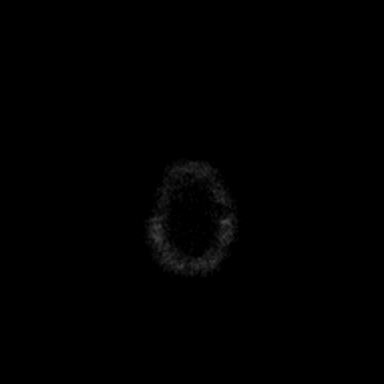

[Series 6: ax dwi_adc · axial · 3.0mm · 0.60mm/px · z∈[-86,+61]mm · 3 of 46 slices shown]
[im 1/46]
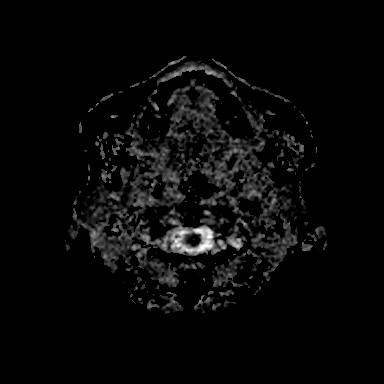
[im 23/46]
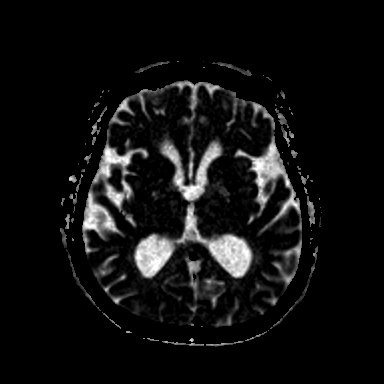
[im 46/46]
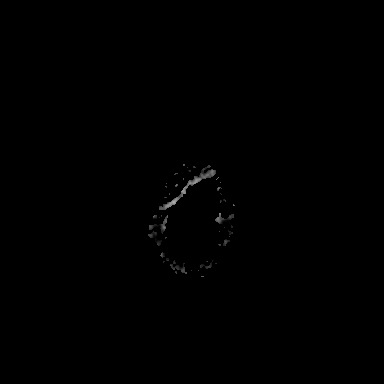

[Series 7: cor dwi_tracew · coronal · 5.0mm · 0.60mm/px · 2 of 32 slices shown]
[im 1/32]
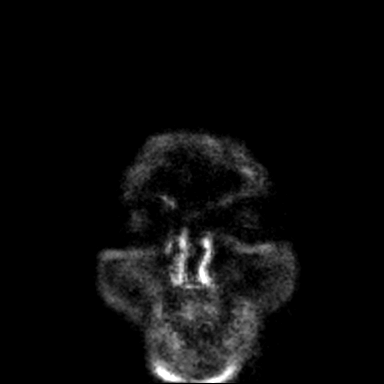
[im 32/32]
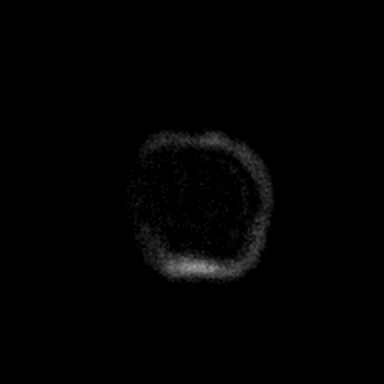

[Series 8: cor dwi_adc · coronal · 5.0mm · 0.60mm/px · 2 of 32 slices shown]
[im 1/32]
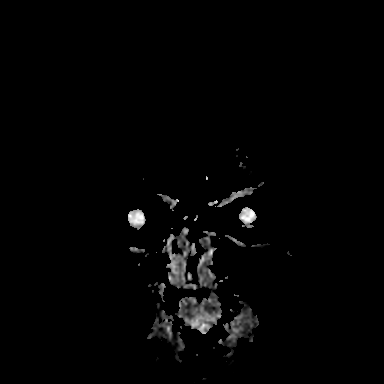
[im 32/32]
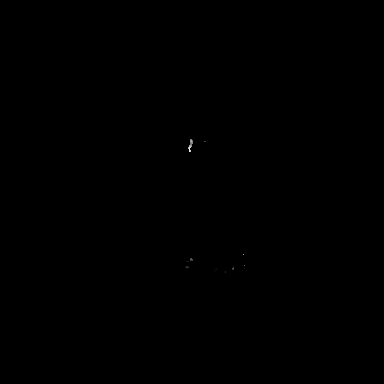

[Series 9: T1 · sagittal · 5.0mm · 0.62mm/px · 1 of 21 slices shown (1 of 2)]
[im 1/21]
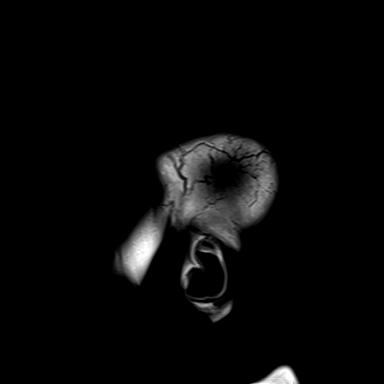

[Series 10: T2 · axial · 5.0mm · 0.53mm/px · 1 of 25 slices shown]
[im 1/25]
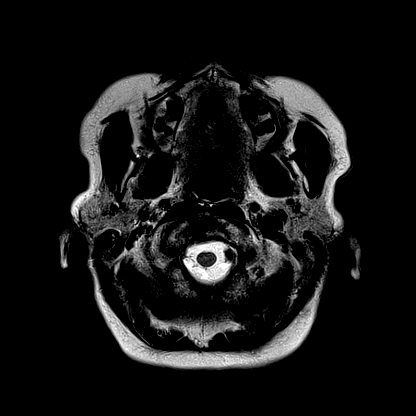

[Series 11: mag_images · axial · 3.0mm · 0.90mm/px · z∈[-101,+74]mm · 3 of 60 slices shown]
[im 1/60]
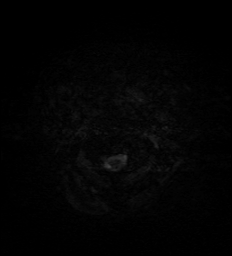
[im 30/60]
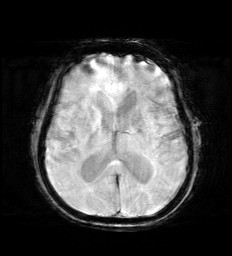
[im 60/60]
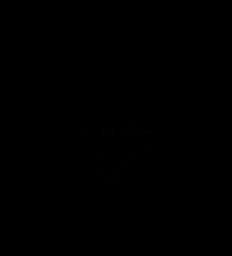

[Series 12: pha_images · axial · 3.0mm · 0.90mm/px · z∈[-101,+74]mm · 3 of 58 slices shown]
[im 1/58]
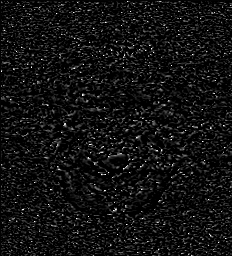
[im 29/58]
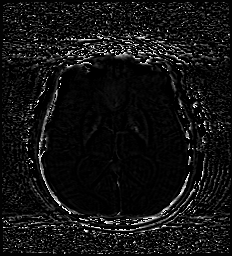
[im 58/58]
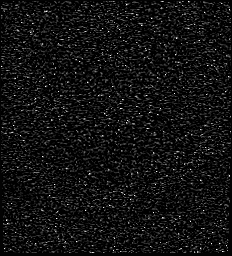

[Series 13: swi_images · axial · 3.0mm · 0.90mm/px · z∈[-101,+74]mm · 3 of 60 slices shown]
[im 1/60]
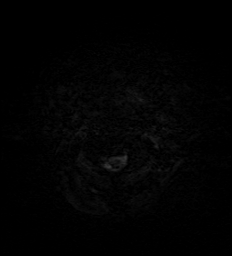
[im 30/60]
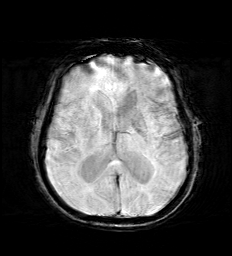
[im 60/60]
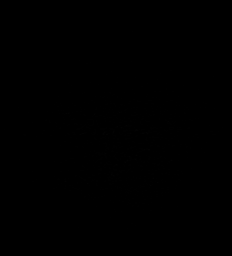

[Series 15: FLAIR · axial · 3.0mm · 0.53mm/px · z∈[-94,+66]mm · 3 of 55 slices shown]
[im 1/55]
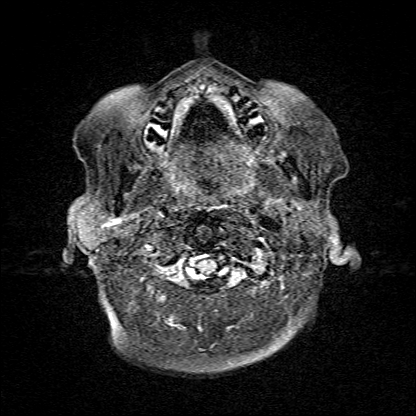
[im 28/55]
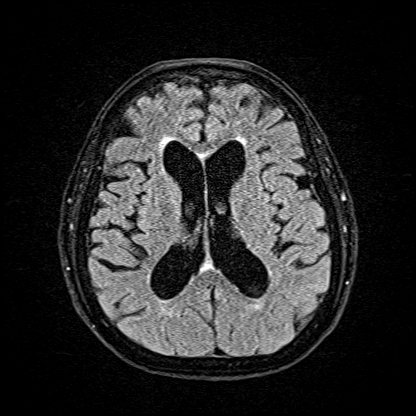
[im 55/55]
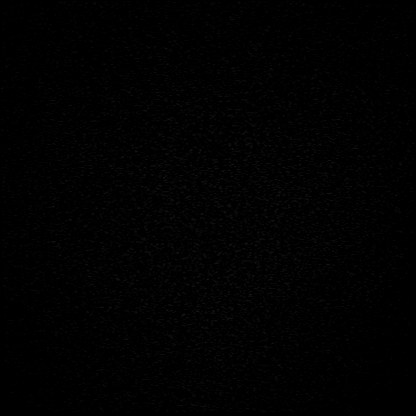

[Series 16: T1 · axial · 1.0mm · 0.98mm/px · z∈[-96,+62]mm · 9 of 160 slices shown (2 of 2)]
[im 1/160]
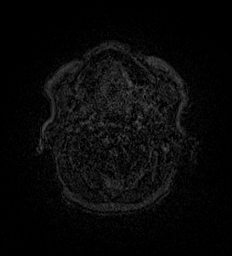
[im 20/160]
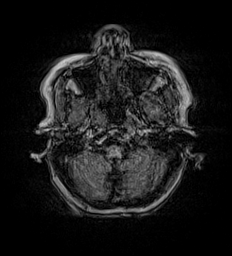
[im 40/160]
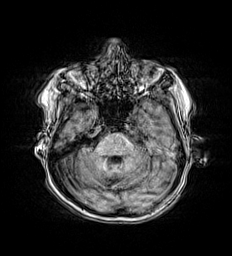
[im 60/160]
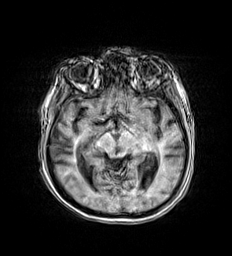
[im 80/160]
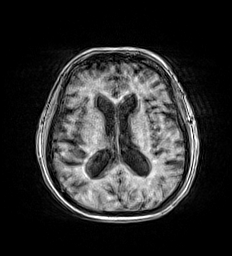
[im 100/160]
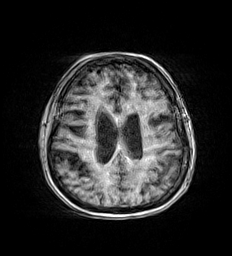
[im 120/160]
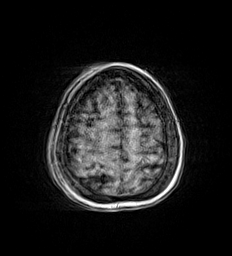
[im 140/160]
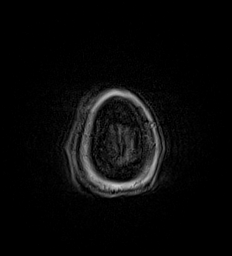
[im 160/160]
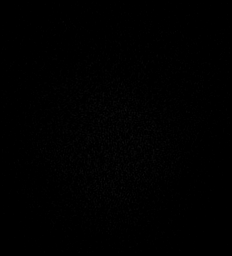

[Series 17: T2 post-contrast · coronal · 5.0mm · 0.57mm/px · 2 of 29 slices shown]
[im 1/29]
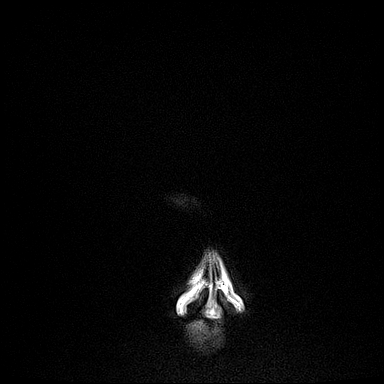
[im 29/29]
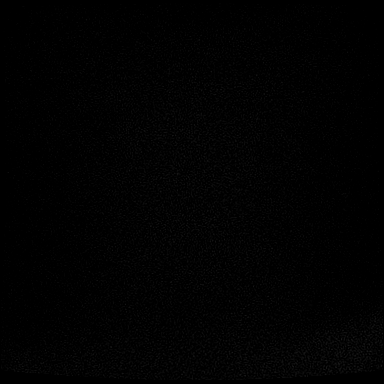

[Series 18: T1 post-contrast · axial · 1.0mm · 0.98mm/px · z∈[-96,+62]mm · 9 of 160 slices shown (1 of 3)]
[im 1/160]
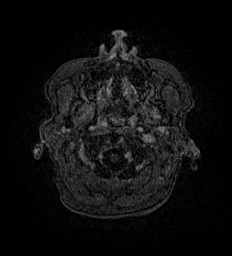
[im 20/160]
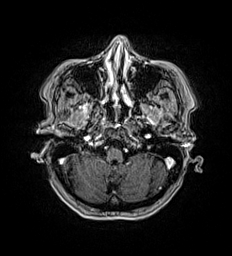
[im 40/160]
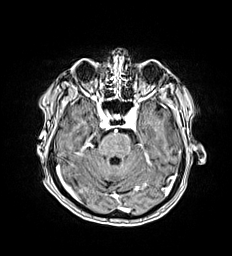
[im 60/160]
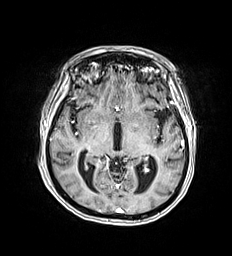
[im 80/160]
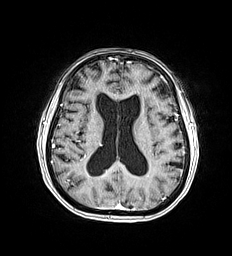
[im 100/160]
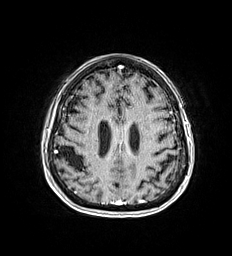
[im 120/160]
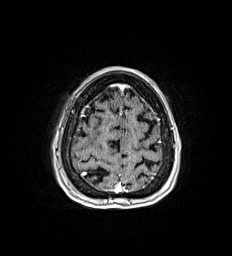
[im 140/160]
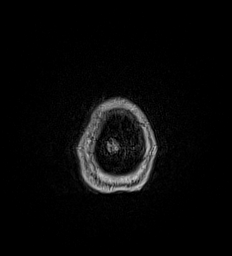
[im 160/160]
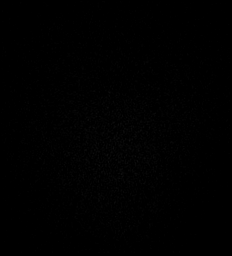

[Series 19: T1 post-contrast · coronal · 5.0mm · 0.57mm/px · 2 of 29 slices shown (2 of 3)]
[im 1/29]
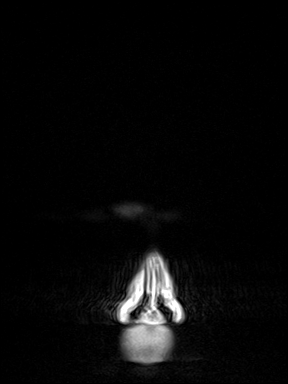
[im 29/29]
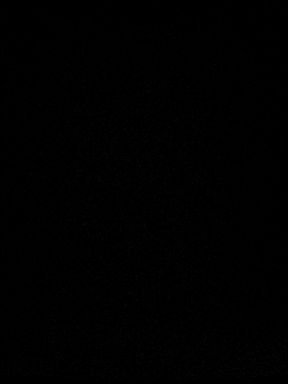

[Series 20: T1 post-contrast · sagittal · 5.0mm · 0.62mm/px · 1 of 21 slices shown (3 of 3)]
[im 1/21]
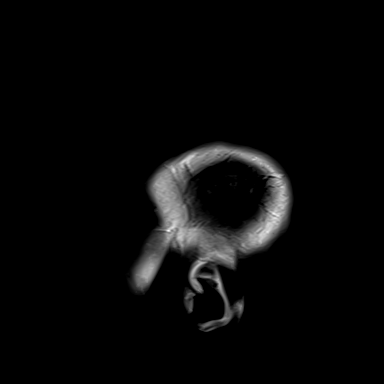

[48 of 48 positions shown; findings below may reference images not displayed]

FINDINGS: Brain: Diffusion imaging does not show any acute or subacute
infarction or other cause of restricted diffusion. There chronic
small-vessel ischemic changes of the pons. No focal cerebellar
insult. Cerebral hemispheres show pronounced generalized atrophy
with moderate chronic small-vessel ischemic changes of the white
matter. No cortical or large vessel territory infarction. No
evidence of mass lesion, hemorrhage, hydrocephalus or extra-axial
collection.

Vascular: Major vessels at the base of the brain show flow.

Skull and upper cervical spine: Negative

Sinuses/Orbits: Clear/normal

Other: None
IMPRESSION: No change. No evidence of metastatic disease. Atrophy and chronic
small-vessel ischemic changes.

## 2020-01-18 ENCOUNTER — Encounter: Payer: Medicare Other | Admitting: Family Medicine

## 2020-01-19 ENCOUNTER — Other Ambulatory Visit: Payer: Self-pay

## 2020-01-19 ENCOUNTER — Ambulatory Visit (INDEPENDENT_AMBULATORY_CARE_PROVIDER_SITE_OTHER): Payer: Medicare Other | Admitting: Family Medicine

## 2020-01-19 VITALS — BP 122/60 | HR 58 | Temp 97.6°F | Ht 63.0 in | Wt 153.5 lb

## 2020-01-19 DIAGNOSIS — K5909 Other constipation: Secondary | ICD-10-CM | POA: Diagnosis not present

## 2020-01-19 DIAGNOSIS — M25552 Pain in left hip: Secondary | ICD-10-CM

## 2020-01-19 DIAGNOSIS — L299 Pruritus, unspecified: Secondary | ICD-10-CM

## 2020-01-19 DIAGNOSIS — N3946 Mixed incontinence: Secondary | ICD-10-CM | POA: Diagnosis not present

## 2020-01-19 DIAGNOSIS — C3492 Malignant neoplasm of unspecified part of left bronchus or lung: Secondary | ICD-10-CM

## 2020-01-19 DIAGNOSIS — I350 Nonrheumatic aortic (valve) stenosis: Secondary | ICD-10-CM | POA: Diagnosis not present

## 2020-01-19 DIAGNOSIS — C50912 Malignant neoplasm of unspecified site of left female breast: Secondary | ICD-10-CM

## 2020-01-19 DIAGNOSIS — M85851 Other specified disorders of bone density and structure, right thigh: Secondary | ICD-10-CM | POA: Diagnosis not present

## 2020-01-19 NOTE — Assessment & Plan Note (Signed)
Follows with Dr. Dossie Arbour with worsening nocturia. Advised reaching out to his office to discuss.

## 2020-01-19 NOTE — Assessment & Plan Note (Signed)
Pt told she had murmur. Reviewed ECHO 2020 with aortic stenosis. Discussed warning signs of worsening and that often does not require treatment.

## 2020-01-19 NOTE — Assessment & Plan Note (Signed)
Dexa 2019 with osteopenia but started treatment with Dr. Tasia Catchings within the last year due cancer treatment. Will need repeat in 1-2 years after treatment.

## 2020-01-19 NOTE — Progress Notes (Signed)
Subjective:     Jamie Matthews is a 79 y.o. female presenting for Medicare Wellness (part 2 )     HPI  #constipation - colace daily, miralax will try to take daily, prunes - several times a week - has used magnesium w/ improvement - feels like she needs this up to a few times a week - having lack of energy   #Osteopenia w/ cancer - started treatment under care of Dr. Tasia Catchings  #Lung cancer and breast cancer - is having some acid reflux which started a few months ago - having some heartburn at night  #Dry skin - using humidifier - using lotion - still having itching  #nocturia - tries to limit what she drinks - now going 4-5 times overnight - endorsing some leakage and urgency   #trouble getting around - has had some falls recently - standing up feels like she is going to fall backwards  #knee/hip - did see PT for balance but not for hip pain - getting fatigue - hard time walking with the walker - pain and body fatigue  - was discharged by PT and has not been doing home exercises - went for 3 weeks recently   Review of Systems   Social History   Tobacco Use  Smoking Status Never Smoker  Smokeless Tobacco Never Used        Objective:    BP Readings from Last 3 Encounters:  01/19/20 122/60  01/03/20 100/60  01/02/20 (!) 112/56   Wt Readings from Last 3 Encounters:  01/19/20 153 lb 8 oz (69.6 kg)  01/03/20 138 lb 4 oz (62.7 kg)  01/02/20 141 lb 9.6 oz (64.2 kg)    BP 122/60   Pulse (!) 58   Temp 97.6 F (36.4 C) (Temporal)   Ht 5\' 3"  (1.6 m)   Wt 153 lb 8 oz (69.6 kg)   SpO2 93%   BMI 27.19 kg/m    Physical Exam Constitutional:      General: She is not in acute distress.    Appearance: She is well-developed. She is not diaphoretic.  HENT:     Right Ear: External ear normal.     Left Ear: External ear normal.  Eyes:     Conjunctiva/sclera: Conjunctivae normal.  Cardiovascular:     Rate and Rhythm: Normal rate and  regular rhythm.     Heart sounds: Murmur heard.   Pulmonary:     Effort: Pulmonary effort is normal. No respiratory distress.     Breath sounds: Normal breath sounds. No wheezing.  Musculoskeletal:     Cervical back: Neck supple.     Comments: Ambulating with a walker, very unstable.   Skin:    General: Skin is warm and dry.     Capillary Refill: Capillary refill takes less than 2 seconds.  Neurological:     Mental Status: She is alert. Mental status is at baseline.  Psychiatric:        Mood and Affect: Mood normal.        Behavior: Behavior normal.           Assessment & Plan:   Problem List Items Addressed This Visit      Cardiovascular and Mediastinum   Aortic valve stenosis    Pt told she had murmur. Reviewed ECHO 2020 with aortic stenosis. Discussed warning signs of worsening and that often does not require treatment.         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 Tasia Catchings. Appreciate care        Digestive   Chronic constipation    Advised increasing her intake of colace, prunes, miralax to find a regimen that works and mag citrate prn.         Musculoskeletal and Integument   Osteopenia    Dexa 2019 with osteopenia but started treatment with Dr. Tasia Catchings within the last year due cancer treatment. Will need repeat in 1-2 years after treatment.       Pruritus    Continue lotion. Advised discussing with dermatologist. Could do hydrocortisone for spot treatment. No clear rash on exam.         Other   Urinary incontinence    Follows with Dr. Dossie Arbour with worsening nocturia. Advised reaching out to his office to discuss.       Malignant neoplasm of left female breast [facility])    Followed by Dr. Tasia Catchings - advised calling their office for arimidex refill. Doing well overall.       Left hip pain    Did PT for balance for 2 weeks, but has not been doing home program. Advised reaching out and restarting PT as her gait is now  significantly impacted. F/u with Dr. Lorelei Pont to consider steroid injection - initially diagnosed with bursitis but now complaining about more groin pain.           Return for Dr. Lorelei Pont for possible hip injection.  [name], MD  This visit occurred during the SARS-CoV-2 public health emergency.  Safety protocols were in place, including screening questions prior to the visit, additional usage of staff PPE, and extensive cleaning of exam room while observing appropriate contact time as indicated for disinfecting solutions.

## 2020-01-19 NOTE — Assessment & Plan Note (Signed)
Did PT for balance for 2 weeks, but has not been doing home program. Advised reaching out and restarting PT as her gait is now significantly impacted. F/u with Dr. Lorelei Pont to consider steroid injection - initially diagnosed with bursitis but now complaining about more groin pain.

## 2020-01-19 NOTE — Assessment & Plan Note (Signed)
Advised increasing her intake of colace, prunes, miralax to find a regimen that works and mag citrate prn.

## 2020-01-19 NOTE — Assessment & Plan Note (Signed)
Continue lotion. Advised discussing with dermatologist. Could do hydrocortisone for spot treatment. No clear rash on exam.

## 2020-01-19 NOTE — Patient Instructions (Addendum)
Call your urologists for follow-up visit  Call Physical Therapy - to see if you can restart - call our office if you need a new referral for same issue  Make appointment with Dr. Lorelei Pont for joint injection   Constipation   Constipation is a common issue. Often it is related to diet and occasionally medications.   What you can do to treat your symptoms 1) Fiber -- Eat more fiber rich foods: beans, broccoli, berries, avocados, popcorn, pear/apple, green peas, turnip greens, brussels sprouts, whole grains (barley, bran, quinoa, oatmeal) -- Take a Fiber supplement: Psyllium (Metamucil)  -- Could also eat Prunes daily  2) Hydration  -- Drink more water: Try to drink 64 oz of water per day  3) Exercise -- Moderate exercise (walking, jogging, biking) for 30 minutes, 5 days a week  4) Dedicate time for Bowel movements - do not delay  5) Stool Softener  - Docusate Sodium (Colace) 100 mg daily or twice daily as needed   If 4-6 weeks have passed and the above has not helped then start the following 6) Laxatives -- Polyethylene Glycol (Miralax) - begin with once daily. After a few days can increase to twice daily Or -- Magnesium Citrate -- Common side effect is nausea and diarrhea -- can try if still not improved   Treating chronic constipation is often about finding the right amount of medication and fiber to keep you regular and comfortable. For some people that may be daily metamucil and colace every other day. For others it may be Metamucil and colace twice daily and Miralax 3 times a week. The goal is to go slow and listen to your body. And normal can be anywhere from 2-3 soft bowel movements a day to 1 bowel movement every 2-3 days.

## 2020-01-19 NOTE — Assessment & Plan Note (Signed)
Follows with Dr. Tasia Catchings. Appreciate care

## 2020-01-19 NOTE — Assessment & Plan Note (Signed)
Followed by Dr. Tasia Catchings - advised calling their office for arimidex refill. Doing well overall.

## 2020-01-22 NOTE — Chronic Care Management (AMB) (Signed)
Chronic Care Management Pharmacy  Name: Jamie Matthews  MRN: [id number] DOB: [birth year]  Jamie Matthews Complaint/ HPI  Jamie Matthews,  79 y.o. , female presents for their Initial CCM visit with the clinical pharmacist via telephone due to COVID-19 Pandemic.  PCP : Jamie Noe, MD  Their chronic conditions include: osteopenia, aortic stenosis, urinary incontinence, major dpressive disorder, hyperlipidemia, insomnia, lower leg edema, knee/hip pain, chronic constipation, IBS, GERD.   Office Visits: 01/19/2020 - Did PT for balance for 2 weeks, but has not been doing home program. Advised reaching out and restarting PT as her gait is now significantly impacted. F/u with Dr. Lorelei Matthews to consider steroid injection - initially diagnosed with bursitis but now complaining about more groin pain 01/11/2020 - [facility].  01/03/2020 - turmeric for knee/hip pain. Continue miralax daily and add colace daily. Increase stewise matter to twice daily for each medicine until going regulalry.  12/05/2019 - Turmeric 500 mg tid for hip and knee pain.  10/20/2019 - trochanteric bursitis - home PT, ICE and low dose meloxicam.  09/21/2019 - injection for trochanteric bursitis of left hip.  09/15/2019 - trochanter bursitis. Ince for 5 minutes at a time multipe times per day.  09/06/2019 - Voltaren gel given for knee pain. Try myrbetriq and refer to urologist.  Consult Visit: 01/02/2020 - oncology - Continue crizotinib and Arimidex. Xgeva given. Cotinue Miralax daily as needed. Try colace 100 mg daily.  12/20/2019 - endocrinology - Hypocalcemia. She has had a severely low calcium in the last few months but it has been low to a lesser degree for about a year and a half. Work-up has been unrevealing except for a mildly low vitamin D. It is unclear if she ever had any treatment for her osteopenia. If she did, that could have played a role. At this point, I will replace her vitamin D with weekly  prescription strength supplementation. I asked her to increase her calcium supplement to 2 tabs twice a day with meals. 11/30/2019 - ED to Hospital - hydrate with IV fluids and give dose of IV calcium gluconate, which should help with her hypocalcemia and hypermagnesemia.  EKG is consistent with sinus bradycardia with no acute ischemic changes 11/30/2019 - oncology - I advised patient to go to emergency room for further evaluation, calcium gluconate infusion and further evaluation of bradycardia.  Suspect bradycardia may be secondary to hypocalcemia induced sinus node slowing. 11/02/2019 - Oncology - Advised to double calcium to 2400 mg daily.  11/10/2019 - ED - scalp laceration repaired in ED after fall.  10/07/2019 - oncology - trated for nausea with IV zofran 8 mg x1 and fluids.  09/23/2019 - Urology - Discussed the nature r/b of pelvic floor PT or going back on an OAB meds. She would like to avoid medicine. [facility] is [facility] and will check with their PT dept.  09/20/2019 - oncology - hold xgeva due to hypocalcemia.  08/17/2019 - ED for type 2 skin tear of left upper arm - tetanus shot updated in ED. External sutures placed. Keflex tid x7days.  08/12/2019 - Oncology - continue arimidex. Frequent falls -> refer to PT. Osteopenia treated with Xgeva today. Continue Calcum and Vitamin D.  Medications: Outpatient Encounter Medications as of 01/23/2020  Medication Sig  . acetaminophen (TYLENOL) 325 MG tablet Take 325 mg by mouth every 6 (six) hours as needed for mild pain.   [name] anastrozole (ARIMIDEX) 1 MG tablet TAKE 1 TABLET BY MOUTH DAILY  .  atorvastatin (LIPITOR) 20 MG tablet Take 1 tablet (20 mg total) by mouth at bedtime.  [name] buPROPion (WELLBUTRIN XL) 150 MG 24 hr tablet TAKE ONE TABLET BY MOUTH EVERY DAY (Patient taking differently: Take 150 mg by mouth daily. )  . Calcium-Magnesium-Vitamin D (CALCIUM 1200+D3 PO) Take 1 tablet by mouth daily.  [name] desonide (DESOWEN) 0.05 % lotion Apply 1 application  topically as needed (after showering).   . fluticasone (FLONASE) 50 MCG/ACT nasal spray Place 2 sprays into both nostrils daily.  [name] ketoconazole (NIZORAL) 2 % shampoo Apply 1 application topically as needed. 3 times a week  . levothyroxine (SYNTHROID) 25 MCG tablet Take 1 tablet (25 mcg total) by mouth daily.  [name] lubiprostone (AMITIZA) 24 MCG capsule TAKE ONE CAPSULE TWICE A DAY WITH MEALS (Patient taking differently: Take 24 mcg by mouth 2 (two) times daily with a meal. TAKE ONE CAPSULE TWICE A DAY WITH MEALS)  . memantine (NAMENDA) 10 MG tablet TAKE ONE TABLET BY MOUTH TWICE DAILY (Patient taking differently: Take 10 mg by mouth 2 (two) times daily. )  . mirabegron ER (MYRBETRIQ) 50 MG TB24 tablet Take 1 tablet (50 mg total) by mouth daily.  . Multiple Vitamin (MULTIVITAMIN WITH MINERALS) TABS tablet Take 1 tablet by mouth daily.  . ondansetron (ZOFRAN) 8 MG tablet TAKE ONE TABLET EVERY EIGHT HOURS AS NEEDED FOR NAUSEA / VOMITING (Patient taking differently: Take 8 mg by mouth every 8 (eight) hours as needed for nausea or vomiting. )  . polyethylene glycol (MIRALAX / GLYCOLAX) packet Take 17 g by mouth daily.  . sucralfate (CARAFATE) 1 g tablet Take 1 tablet (1 g total) by mouth 2 (two) times daily.  [name] venlafaxine XR (EFFEXOR-XR) 150 MG 24 hr capsule TAKE 1 CAPSULE BY MOUTH EVERY DAY  . Vitamin D, Ergocalciferol, (DRISDOL) 1.25 MG (50000 UNIT) CAPS capsule Take 50,000 Units by mouth once a week.  [name] XALKORI 250 MG capsule TAKE 1 CAPSULE (250 MG TOTAL) BY MOUTH 2 TIMES DAILY.  [name] Dextromethorphan-guaiFENesin (MUCINEX DM) 30-600 MG TB12 Take 1 tablet by mouth 2 (two) times daily as needed (for congestion/cough).  (Patient not taking: Reported on 01/23/2020)  . docusate sodium (COLACE) 100 MG capsule Take 1 capsule (100 mg total) by mouth daily.  [name] omeprazole (PRILOSEC) 40 MG capsule Take 40 mg by mouth daily. (Patient not taking: Reported on 01/23/2020)  . triamcinolone (NASACORT) 55 MCG/ACT AERO nasal  inhaler Place 2 sprays into the nose daily as needed (for congestion).  (Patient not taking: Reported on 01/23/2020)   No facility-administered encounter medications on file as of 01/23/2020.   Current Diagnosis/Assessment:  Goals Addressed            This Visit's Progress   . Pharmacy Care Plan          Hyperlipidemia   LDL goal < 100  Lipid Panel     Component Value Date/Time   CHOL 168 11/11/2017 1211   TRIG 73.0 11/11/2017 1211   HDL 53.10 11/11/2017 1211   LDLCALC 100 (H) 11/11/2017 1211   LDLDIRECT 175.9 02/05/2012 1205    Hepatic Function Latest Ref Rng & Units 01/02/2020 11/30/2019 11/30/2019  Total Protein 6.5 - 8.1 g/dL 6.5 6.5 6.4(L)  Albumin 3.5 - 5.0 g/dL 3.5 3.5 3.5  AST 15 - 41 U/L 25 33 32  ALT 0 - 44 U/L 24 41 41  Alk Phosphatase 38 - 126 U/L 92 96 99  Total Bilirubin 0.3 - 1.2 mg/dL 0.7 0.6 0.5  The ASCVD Risk Matthews Mikey Bussing DC Jr., et al., 2013) failed to calculate for the following reasons:   The 2013 ASCVD risk Matthews is only valid for ages 33 to 27   Patient has failed these meds in past: none reported  Patient is currently controlled on the following medications:  . Atorvastatin 20 mg daily at bedtime   We discussed:  diet and exercise extensively. Patient reports eating a variety of fruits and vegetables. Patient denies regular exercise. Patient is not certain why her PT stopped. She plans to contact her therapist to resume physical therapy. Patient states that physical therapy improved her balance.   Adherence: Fill history indicates excellent compliance.   Plan  Continue current medications   Hypothyroidism   Lab Results  Component Value Date/Time   TSH 4.98 (H) 09/06/2019 12:18 PM   TSH 1.320 10/10/2018 05:03 AM   TSH 4.15 09/28/2017 10:37 AM   FREET4 0.70 09/07/2019 08:36 AM   FREET4 0.76 05/12/2016 12:18 PM    Patient has failed these meds in past: none reported Patient is currently controlled on the following medications:   . Levothyroxine 25 mcg daily  We discussed:  Patient began treatment in April due to fatigue. Will monitor/review next updated thyroid panels to assess therapy needs.   Plan  Continue current medications   Constipation   Patient has failed these meds in past: senna, Linzess Patient is currently controlled on the following medications:  . Docusate 100 mg daily . Amitiza 24 mcg bid with meals . Miralax 17 grams daily  We discussed:  Patient states she has been taking one capsule daily of Amitiza but is increasing to twice daily today. Patient denies increasing Miralax and Stool softener to twice daily as directed.   Diet: Eats a lot of fruit and vegetables. Drinks orange juice/water/iced tea/decaf coffee. Occasionally drinks sprite. Patient isn't sure exactly how much water she is drinking each day but states she plans to work on getting 8 - 8 oz glasses each day.   Plan  Continue current medications  GERD   Patient has failed these meds in past: ranitidine Patient is currently controlled on the following medications:  . Omeprazole 40 mg daily - patient not taking  Sucralfate 1 grams bid - patient is not taking currently  We discussed:  Patient denies any issues with acid reflux and has not been taking omeprazole 40 mg daily for some time. She doesn't plan to resume therapy and denies recent symptoms. Avoids spicy and fried foods. Patient avoids tight waistbands.   Plan  Continue current medications   Memory Loss   Patient has failed these meds in past: n/a Patient is currently controlled on the following medications:  . memantine 10 mg bid   We discussed:  Patient denies side effects to treatment.  . Plan  Continue current medications    Depression   Patient has failed these meds in past: none reported Patient is currently controlled on the following medications:  . Venlafaxine xr 150 mg daily . Bupropion xl 150 mg daily  We discussed:  Patient reports good  control of symptoms.   Plan  Continue current medications  Osteopenia / Osteoporosis   Last DEXA Scan: 12/18/2017  T-Matthews femoral neck: -1.8/-1.7  T-Matthews lumbar spine: 1.1  10-year probability of major osteoporotic fracture: 13.6%  10-year probability of hip fracture: 3.7%  Vit D, 25-Hydroxy  Date Value Ref Range Status  11/30/2019 24.47 (L) 30 - 100 ng/mL Final    Comment:    (  NOTE) Vitamin D deficiency has been defined by the [place] practice guideline as a level of serum 25-OH  vitamin D less than 20 ng/mL (1,2). The Endocrine Society went on to  further define vitamin D insufficiency as a level between 21 and 29  ng/mL (2).  1. IOM (Institute of Medicine). 2010. Dietary reference intakes for  calcium and D. [place]: The [employer]. 2. Holick MF, [place], Bischoff-Ferrari HA, et al. Evaluation,  treatment, and prevention of vitamin D deficiency: an Endocrine  Society clinical practice guideline, JCEM. 2011 Jul; 96(7): 1911-30.  Performed at [facility], [place] [address] [place], [place] [address]      Patient is a candidate for pharmacologic treatment due to T-Matthews -1.0 to -2.5 and 10-year risk of hip fracture > 3%  Patient has failed these meds in past: none reported Patient is currently controlled on the following medications:  . Calcium-magnesium-vitamin d daily 1200 mg daily . Vitamin D 50,000 unit weekly  We discussed:  Recommend weight-bearing and muscle strengthening exercises for building and maintaining bone density.   Discussed the recommendation from endocrinologist to increase calcium. Patient does not recall this recommendation. Discussed endocrinology recommendation.   Plan  Continue current medications and increase Calcium as directed with endocrinology  Health Maintenance   Patient is currently controlled on the following medications:  . Acetaminophen 325 mg q6 hours prn  mild pain . anastrazole 1 mg daily - per oncology hx of breast cancer  . Xalkori 250 mg bid - per oncology . Myrbetriq - overactive bladder . Multiple vitamin daily - supplement . Flonase 2 sprays into both nostrils daily prn - nasal congestion . Ketoconazole 2% shampoo 3 times weekly - itchy scalp per dermatologist . Desonide 0.05% lotion as needed after showering - pruritis  We discussed:  Patient denies regular pain and indicates that acetaminophen has not been utilized lately. Patient reports improvement in overactive bladder symptoms while on Myrbetriq.   Plan  Continue current medications  Vaccines   Reviewed and discussed patient's vaccination history.    Immunization History  Administered Date(s) Administered  . Fluad Quad(high Dose 65+) 02/01/2019  . Influenza Split 02/05/2012  . Influenza, High Dose Seasonal PF 03/23/2018  . Influenza,inj,Quad PF,6+ Mos 02/15/2015, 02/24/2017  . Influenza-Unspecified 02/23/2013  . Moderna SARS-COVID-2 Vaccination 08/05/2019, 09/17/2019  . Pneumococcal Conjugate-13 12/13/2013  . Pneumococcal Polysaccharide-23 07/18/2015  . Tdap 08/17/2019    Plan  Recommended patient receive annual flu vaccine in office.   Medication Management   Pt uses [facility] for all medications Uses pill box? Yes Pt endorses excellent compliance - Patient denies missed doses.   We discussed: Patient states that her medications are delivered from a local pharmacy. She uses a pill box and has assistance from a home health nurse. She denies the need for packaging at this time.   Plan  Continue current medication management strategy    Follow up: 6 month phone visit

## 2020-01-23 ENCOUNTER — Ambulatory Visit: Payer: Medicare Other

## 2020-01-23 ENCOUNTER — Telehealth: Payer: Self-pay | Admitting: Family Medicine

## 2020-01-23 ENCOUNTER — Telehealth: Payer: Self-pay

## 2020-01-23 ENCOUNTER — Other Ambulatory Visit: Payer: Self-pay

## 2020-01-23 DIAGNOSIS — E7800 Pure hypercholesterolemia, unspecified: Secondary | ICD-10-CM

## 2020-01-23 DIAGNOSIS — E038 Other specified hypothyroidism: Secondary | ICD-10-CM

## 2020-01-23 DIAGNOSIS — M25561 Pain in right knee: Secondary | ICD-10-CM

## 2020-01-23 DIAGNOSIS — M25552 Pain in left hip: Secondary | ICD-10-CM

## 2020-01-23 DIAGNOSIS — R2689 Other abnormalities of gait and mobility: Secondary | ICD-10-CM

## 2020-01-23 NOTE — Patient Instructions (Signed)
Visit Information  Thank you for your time discussing your medications. I look forward to working with you to achieve your health care goals. Below is a summary of what we talked about during our visit.   Goals Addressed            This Visit's Progress   . Pharmacy Care Plan       CARE PLAN ENTRY (see longitudinal plan of care for additional care plan information)  Current Barriers:  . Chronic Disease Management support, education, and care coordination needs related to Hyperlipidemia and hypothyroidism.   Hyperlipidemia Lab Results  Component Value Date/Time   LDLCALC 100 (H) 11/11/2017 12:11 PM   LDLDIRECT 175.9 02/05/2012 12:05 PM   . Pharmacist Clinical Goal(s): o Over the next 90 days, patient will work with PharmD and providers to achieve LDL goal < 100 . Current regimen:  o Atorvastatin 20 mg daily at bedtime . Interventions: o Reviewed patient's medication list.  o Discussed the benefits of healthy diet of fruits and vegetables.  o Discussed exercise and resuming physical therapy.  . Patient self care activities - Over the next 90 days, patient will: o Continue to take medication as prescribed.  o Contact pharmacist and provider with any questions.   Subclinical Hypothyroidism . Pharmacist Clinical Goal(s) o Over the next 90 days, patient will work with PharmD and providers to manage symptoms of hypothyroidism . Current regimen:  o Levothyroxine 25 mcg daily  . Interventions: o Reviewed prescribed medications.  o Will review next thryoid panel.  . Patient self care activities - Over the next 90 days, patient will: o Continue taking thyroid medication at least 30 minutes before other food and medication.   Medication management . Pharmacist Clinical Goal(s): o Over the next 90 days, patient will work with PharmD and providers to maintain optimal medication adherence . Current pharmacy: [facility]  . Interventions o Comprehensive medication review  performed. o Continue current medication management strategy . Patient self care activities - Over the next 90 days, patient will: o Focus on medication adherence by continuing to use pill box.  o Take medications as prescribed o Report any questions or concerns to PharmD and/or provider(s)  Initial goal documentation        Jamie Matthews was given information about Chronic Care Management services today including:  1. CCM service includes personalized support from designated clinical staff supervised by her physician, including individualized plan of care and coordination with other care providers 2. 24/7 contact phone numbers for assistance for urgent and routine care needs. 3. Standard insurance, coinsurance, copays and deductibles apply for chronic care management only during months in which we provide at least 20 minutes of these services. Most insurances cover these services at 100%, however patients may be responsible for any copay, coinsurance and/or deductible if applicable. This service may help you avoid the need for more expensive face-to-face services. 4. Only one practitioner may furnish and bill the service in a calendar month. 5. The patient may stop CCM services at any time (effective at the end of the month) by phone call to the office staff.  Patient agreed to services and verbal consent obtained.   The patient verbalized understanding of instructions provided today and agreed to receive a mailed copy of patient instruction and/or educational materials. Telephone follow up appointment with pharmacy team member scheduled for: 06/2020   Sherre Poot, PharmD Clinical Pharmacist [facility] [phone] (office) [phone] (mobile)  Exercises To  Do While Sitting  Exercises that you do while sitting (chair exercises) can give you many of the same benefits as full exercise. Benefits include strengthening your heart, burning calories, and keeping muscles and  joints healthy. Exercise can also improve your mood and help with depression and anxiety. You may benefit from chair exercises if you are unable to do standing exercises because of:  Diabetic foot pain.  Obesity.  Illness.  Arthritis.  Recovery from surgery or injury.  Breathing problems.  Balance problems.  Another type of disability. Before starting chair exercises, check with your health care provider or a physical therapist to find out how much exercise you can tolerate and which exercises are safe for you. If your health care provider approves:  Start out slowly and build up over time. Aim to work up to about 10-20 minutes for each exercise session.  Make exercise part of your daily routine.  Drink water when you exercise. Do not wait until you are thirsty. Drink every 10-15 minutes.  Stop exercising right away if you have pain, nausea, shortness of breath, or dizziness.  If you are exercising in a wheelchair, make sure to lock the wheels.  Ask your health care provider whether you can do tai chi or yoga. Many positions in these mind-body exercises can be modified to do while seated. Warm-up Before starting other exercises: 1. Sit up as straight as you can. Have your knees bent at 90 degrees, which is the shape of the capital letter "L." Keep your feet flat on the floor. 2. Sit at the front edge of your chair, if you can. 3. Pull in (tighten) the muscles in your abdomen and stretch your spine and neck as straight as you can. Hold this position for a few minutes. 4. Breathe in and out evenly. Try to concentrate on your breathing, and relax your mind. Stretching Exercise A: Arm stretch 1. Hold your arms out straight in front of your body. 2. Bend your hands at the wrist with your fingers pointing up, as if signaling someone to stop. Notice the slight tension in your forearms as you hold the position. 3. Keeping your arms out and your hands bent, rotate your hands outward  as far as you can and hold this stretch. Aim to have your thumbs pointing up and your pinkie fingers pointing down. Slowly repeat arm stretches for one minute as tolerated. Exercise B: Leg stretch 1. If you can move your legs, try to "draw" letters on the floor with the toes of your foot. Write your name with one foot. 2. Write your name with the toes of your other foot. Slowly repeat the movements for one minute as tolerated. Exercise C: Reach for the sky 1. Reach your hands as far over your head as you can to stretch your spine. 2. Move your hands and arms as if you are climbing a rope. Slowly repeat the movements for one minute as tolerated. Range of motion exercises Exercise A: Shoulder roll 1. Let your arms hang loosely at your sides. 2. Lift just your shoulders up toward your ears, then let them relax back down. 3. When your shoulders feel loose, rotate your shoulders in backward and forward circles. Do shoulder rolls slowly for one minute as tolerated. Exercise B: March in place 1. As if you are marching, pump your arms and lift your legs up and down. Lift your knees as high as you can. ? If you are unable to lift your knees, just  pump your arms and move your ankles and feet up and down. March in place for one minute as tolerated. Exercise C: Seated jumping jacks 1. Let your arms hang down straight. 2. Keeping your arms straight, lift them up over your head. Aim to point your fingers to the ceiling. 3. While you lift your arms, straighten your legs and slide your heels along the floor to your sides, as wide as you can. 4. As you bring your arms back down to your sides, slide your legs back together. ? If you are unable to use your legs, just move your arms. Slowly repeat seated jumping jacks for one minute as tolerated. Strengthening exercises Exercise A: Shoulder squeeze 1. Hold your arms straight out from your body to your sides, with your elbows bent and your fists pointed at  the ceiling. 2. Keeping your arms in the bent position, move them forward so your elbows and forearms meet in front of your face. 3. Open your arms back out as wide as you can with your elbows still bent, until you feel your shoulder blades squeezing together. Hold for 5 seconds. Slowly repeat the movements forward and backward for one minute as tolerated. Contact a health care provider if you:  Had to stop exercising due to any of the following: ? Pain. ? Nausea. ? Shortness of breath. ? Dizziness. ? Fatigue.  Have significant pain or soreness after exercising. Get help right away if you have:  Chest pain.  Difficulty breathing. These symptoms may represent a serious problem that is an emergency. Do not wait to see if the symptoms will go away. Get medical help right away. Call your local emergency services (911 in the [place] Do not drive yourself to the hospital. This information is not intended to replace advice given to you by your health care provider. Make sure you discuss any questions you have with your health care provider. Document Revised: 09/02/2018 Document Reviewed: 03/25/2017 Elsevier Patient Education  2020 [employer].

## 2020-01-23 NOTE — Telephone Encounter (Signed)
Noted appreciate the support

## 2020-01-23 NOTE — Progress Notes (Addendum)
Patient had an initial CCM visit with pharmacist today.   She indicates that she is out of refills for:   Flonase nasal spray.  Please send refill to [facility] in [place] for medication delivery.   Thank you, Sherre Poot, PharmD, [facility] Clinical Pharmacist [facility] [phone] (office) [phone] (mobile)

## 2020-01-23 NOTE — Telephone Encounter (Signed)
Patient calls in to report ongoing right hip pain.  She is wanting to make an appt for a cortisone injection.    I set her up for an appt with Dr. Lorelei Pont on 02/01/20 for eval and injection consideration.  FYI to PCP.

## 2020-01-23 NOTE — Progress Notes (Signed)
This encounter was created in error - please disregard.

## 2020-01-23 NOTE — Telephone Encounter (Signed)
Please advise 

## 2020-01-23 NOTE — Telephone Encounter (Signed)
New referral to [facility] provided

## 2020-01-23 NOTE — Telephone Encounter (Signed)
Left message to return call 

## 2020-01-23 NOTE — Telephone Encounter (Signed)
Pt called wanting to know if dr cody would do a referral to [facility] for balance and endurance in addition to the right knee pain

## 2020-01-26 ENCOUNTER — Ambulatory Visit
Admission: RE | Admit: 2020-01-26 | Discharge: 2020-01-26 | Disposition: A | Discharge status: Home / Self Care | Payer: Medicare Other | Source: Ambulatory Visit | Attending: Oncology | Admitting: Oncology

## 2020-01-26 ENCOUNTER — Other Ambulatory Visit: Payer: Self-pay

## 2020-01-26 DIAGNOSIS — I700 Atherosclerosis of aorta: Secondary | ICD-10-CM | POA: Diagnosis not present

## 2020-01-26 DIAGNOSIS — C3490 Malignant neoplasm of unspecified part of unspecified bronchus or lung: Secondary | ICD-10-CM | POA: Insufficient documentation

## 2020-01-26 DIAGNOSIS — K439 Ventral hernia without obstruction or gangrene: Secondary | ICD-10-CM | POA: Diagnosis not present

## 2020-01-26 DIAGNOSIS — C50912 Malignant neoplasm of unspecified site of left female breast: Secondary | ICD-10-CM | POA: Insufficient documentation

## 2020-01-26 DIAGNOSIS — S32591A Other specified fracture of right pubis, initial encounter for closed fracture: Secondary | ICD-10-CM | POA: Diagnosis not present

## 2020-01-31 ENCOUNTER — Encounter: Payer: Self-pay | Admitting: Oncology

## 2020-01-31 ENCOUNTER — Other Ambulatory Visit: Payer: Self-pay

## 2020-01-31 ENCOUNTER — Other Ambulatory Visit: Payer: Self-pay | Admitting: *Deleted

## 2020-01-31 ENCOUNTER — Inpatient Hospital Stay (HOSPITAL_BASED_OUTPATIENT_CLINIC_OR_DEPARTMENT_OTHER): Payer: Medicare Other | Admitting: Oncology

## 2020-01-31 ENCOUNTER — Inpatient Hospital Stay: Payer: Medicare Other | Attending: Oncology

## 2020-01-31 ENCOUNTER — Inpatient Hospital Stay: Payer: Medicare Other

## 2020-01-31 VITALS — BP 101/56 | HR 57 | Temp 97.7°F | Resp 16 | Wt 150.8 lb

## 2020-01-31 DIAGNOSIS — Z79899 Other long term (current) drug therapy: Secondary | ICD-10-CM | POA: Diagnosis not present

## 2020-01-31 DIAGNOSIS — I700 Atherosclerosis of aorta: Secondary | ICD-10-CM | POA: Insufficient documentation

## 2020-01-31 DIAGNOSIS — C50912 Malignant neoplasm of unspecified site of left female breast: Secondary | ICD-10-CM | POA: Diagnosis not present

## 2020-01-31 DIAGNOSIS — K219 Gastro-esophageal reflux disease without esophagitis: Secondary | ICD-10-CM | POA: Diagnosis not present

## 2020-01-31 DIAGNOSIS — C3490 Malignant neoplasm of unspecified part of unspecified bronchus or lung: Secondary | ICD-10-CM

## 2020-01-31 DIAGNOSIS — Z79811 Long term (current) use of aromatase inhibitors: Secondary | ICD-10-CM | POA: Diagnosis not present

## 2020-01-31 DIAGNOSIS — M7989 Other specified soft tissue disorders: Secondary | ICD-10-CM | POA: Diagnosis not present

## 2020-01-31 DIAGNOSIS — K439 Ventral hernia without obstruction or gangrene: Secondary | ICD-10-CM | POA: Diagnosis not present

## 2020-01-31 DIAGNOSIS — C50812 Malignant neoplasm of overlapping sites of left female breast: Secondary | ICD-10-CM | POA: Diagnosis not present

## 2020-01-31 DIAGNOSIS — R110 Nausea: Secondary | ICD-10-CM | POA: Insufficient documentation

## 2020-01-31 DIAGNOSIS — R600 Localized edema: Secondary | ICD-10-CM | POA: Insufficient documentation

## 2020-01-31 DIAGNOSIS — Z833 Family history of diabetes mellitus: Secondary | ICD-10-CM | POA: Insufficient documentation

## 2020-01-31 DIAGNOSIS — Z8719 Personal history of other diseases of the digestive system: Secondary | ICD-10-CM | POA: Diagnosis not present

## 2020-01-31 DIAGNOSIS — Z5111 Encounter for antineoplastic chemotherapy: Secondary | ICD-10-CM | POA: Diagnosis not present

## 2020-01-31 DIAGNOSIS — C3412 Malignant neoplasm of upper lobe, left bronchus or lung: Secondary | ICD-10-CM | POA: Insufficient documentation

## 2020-01-31 DIAGNOSIS — Z170 Estrogen receptor positive status [ER+]: Secondary | ICD-10-CM | POA: Insufficient documentation

## 2020-01-31 DIAGNOSIS — C3492 Malignant neoplasm of unspecified part of left bronchus or lung: Secondary | ICD-10-CM

## 2020-01-31 DIAGNOSIS — M8580 Other specified disorders of bone density and structure, unspecified site: Secondary | ICD-10-CM | POA: Diagnosis not present

## 2020-01-31 DIAGNOSIS — Z882 Allergy status to sulfonamides status: Secondary | ICD-10-CM | POA: Insufficient documentation

## 2020-01-31 DIAGNOSIS — Z8249 Family history of ischemic heart disease and other diseases of the circulatory system: Secondary | ICD-10-CM | POA: Diagnosis not present

## 2020-01-31 DIAGNOSIS — I517 Cardiomegaly: Secondary | ICD-10-CM | POA: Diagnosis not present

## 2020-01-31 DIAGNOSIS — Z803 Family history of malignant neoplasm of breast: Secondary | ICD-10-CM | POA: Diagnosis not present

## 2020-01-31 DIAGNOSIS — Z9049 Acquired absence of other specified parts of digestive tract: Secondary | ICD-10-CM | POA: Diagnosis not present

## 2020-01-31 DIAGNOSIS — K5909 Other constipation: Secondary | ICD-10-CM | POA: Insufficient documentation

## 2020-01-31 DIAGNOSIS — I2510 Atherosclerotic heart disease of native coronary artery without angina pectoris: Secondary | ICD-10-CM | POA: Diagnosis not present

## 2020-01-31 DIAGNOSIS — C773 Secondary and unspecified malignant neoplasm of axilla and upper limb lymph nodes: Secondary | ICD-10-CM | POA: Diagnosis not present

## 2020-01-31 DIAGNOSIS — E785 Hyperlipidemia, unspecified: Secondary | ICD-10-CM | POA: Insufficient documentation

## 2020-01-31 LAB — CBC WITH DIFFERENTIAL/PLATELET
Abs Immature Granulocytes: 0.04 10*3/uL (ref 0.00–0.07)
Basophils Absolute: 0 10*3/uL (ref 0.0–0.1)
Basophils Relative: 1 %
Eosinophils Absolute: 0.3 10*3/uL (ref 0.0–0.5)
Eosinophils Relative: 6 %
HCT: 33.4 % — ABNORMAL LOW (ref 36.0–46.0)
Hemoglobin: 11.2 g/dL — ABNORMAL LOW (ref 12.0–15.0)
Immature Granulocytes: 1 %
Lymphocytes Relative: 34 %
Lymphs Abs: 1.8 10*3/uL (ref 0.7–4.0)
MCH: 32.6 pg (ref 26.0–34.0)
MCHC: 33.5 g/dL (ref 30.0–36.0)
MCV: 97.1 fL (ref 80.0–100.0)
Monocytes Absolute: 1 10*3/uL (ref 0.1–1.0)
Monocytes Relative: 19 %
Neutro Abs: 2.1 10*3/uL (ref 1.7–7.7)
Neutrophils Relative %: 39 %
Platelets: 289 10*3/uL (ref 150–400)
RBC: 3.44 MIL/uL — ABNORMAL LOW (ref 3.87–5.11)
RDW: 13.4 % (ref 11.5–15.5)
WBC: 5.3 10*3/uL (ref 4.0–10.5)
nRBC: 0 % (ref 0.0–0.2)

## 2020-01-31 LAB — COMPREHENSIVE METABOLIC PANEL
ALT: 25 U/L (ref 0–44)
AST: 25 U/L (ref 15–41)
Albumin: 3.3 g/dL — ABNORMAL LOW (ref 3.5–5.0)
Alkaline Phosphatase: 82 U/L (ref 38–126)
Anion gap: 7 (ref 5–15)
BUN: 23 mg/dL (ref 8–23)
CO2: 26 mmol/L (ref 22–32)
Calcium: 7.5 mg/dL — ABNORMAL LOW (ref 8.9–10.3)
Chloride: 107 mmol/L (ref 98–111)
Creatinine, Ser: 1.13 mg/dL — ABNORMAL HIGH (ref 0.44–1.00)
GFR calc Af Amer: 53 mL/min — ABNORMAL LOW (ref >60)
GFR calc non Af Amer: 46 mL/min — ABNORMAL LOW (ref >60)
Glucose, Bld: 102 mg/dL — ABNORMAL HIGH (ref 70–99)
Potassium: 4.1 mmol/L (ref 3.5–5.1)
Sodium: 140 mmol/L (ref 135–145)
Total Bilirubin: 0.7 mg/dL (ref 0.3–1.2)
Total Protein: 5.8 g/dL — ABNORMAL LOW (ref 6.5–8.1)

## 2020-01-31 NOTE — Progress Notes (Signed)
Patient reports that she is having new bilateral L>R knee pain.

## 2020-01-31 NOTE — Progress Notes (Signed)
Hematology/Oncology follow up note [facility] Telephone:([phone]) [phone] Fax:([phone]) [phone]   Patient Care Team: [name], MD as PCP - General (Family Medicine) Pyrtle, Lajuan Lines, MD as Consulting Physician (Gastroenterology) Telford Nab, RN as Registered Nurse Earlie Server, MD as Consulting Physician (Hematology and Oncology) Debbora Dus, [facility] as Pharmacist (Pharmacist) Burnice Logan, [facility] as Pharmacist (Pharmacist) Ralene Bathe, MD (Dermatology) Festus Aloe, MD as Consulting Physician (Urology)   REASON FOR VISIT:  Follow up for management of lung cancer and breast cancer  HISTORY OF PRESENTING ILLNESS:  Jamie Matthews is a  79 y.o.  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 05/05/2018 bilateral diagnostic breast mammogram showed suspicious mass 1.1cm  in the 12:00 retroareolar region of the left breast and the left axillary lymph node. 06/08/2018 patient status post a left breast retroareolar and left axillary lymph node biopsy. Pathology showed invasive mammary carcinoma, no special type, grade 1, left axillary lymph node positive for invasive mammary carcinoma clinically metastatic.  Background lymph node architecture is not identified. ER> 90% PR> 90%, HER-2 negative.  PET scan done which unfortunately showed additional hypermetabolic bilateral hilar lymph nodes as well as left upper lobe lung mass which may represent a focus of metastatic disease from breast, or primary lung neoplasm.  There is multiple additional small pulmonary nodules are scattered throughout both lungs which are worrisome for metastasis.  Index nodule within the medial right upper lobe measures 7 mm,, peri-broncho-vascular nodule in the right lower lobe measures 1.2 cm.  # Stage IV lung cancer-  Biopsy of lung mass left upper lobe showed non-small cell lung cancer, favor adenocarcinoma.  #NGS came back patient has PD L1  is 70% TPS, MET fusion mutation.  #Mid-March 2020 started on crizotinib  Bilateral lower extremity edema, right >left, Ultrasound venous right 09/20/2018 no DVT.  2D echo 10/10/2018 showed LVEF 55 to 60% Most likely secondary to crizotinib side effects.  # 12/15/2018 CT chest images were independently reviewed and discussed with patient. Dominant left upper lobe nodule and multiple scattered irregular pulmonary parenchymal nodular lesions are stable. Mild basilar pulmonary parenchymal septal thickening is new and can be seen with pulmonary edema. Patient reports no shortness of breath asymptomatic  #12/29/2018 unilateral left diagnostic mammogram with ultrasound images were independently reviewed and discussed with patient. Slight interval reduction of the size of the left retroareolar breast mass, 9 x 6 x 7 mm, comparing to 10 x 9 x 8 mm. Axillary lymph node measured 1.3 x 1.0 x 1.0 cm, previously 2.2 x 1.4 x 1.5 cm,  there were 2 other abdominal lymph nodes in the left axilla, which were not reported in previous ultrasound. Discussed with patient that overall, breast cancer responded to endocrine treatments.  Additional 2 lymph nodes need to be closely monitored.  Recommend patient to continue Arimidex.   INTERVAL HISTORY Jamie Matthews is a 79 y.o. female who has above history reviewed by me today presents for acute visit for nausea, and constipation.  Patient reports feeling well.  No new complaints. Patient is supposed to be on calcium supplementation and she feels that she may not have been taking calcium supplementation as she is supposed to Denies any dizziness, syncopal episodes, shortness of breath, abdominal pain, fever or chills. Chronic lower extremity edema symptoms are stable. She still have constipation and uses MiraLAX as needed.  She does not take Colace or Amitiza. Review of Systems  Constitutional: Negative for appetite change, chills, fatigue and fever.    HENT:   Negative for hearing loss and voice change.   Eyes: Negative for eye problems.  Respiratory: Negative for chest tightness and cough.   Cardiovascular: Positive for leg swelling. Negative for chest pain.  Gastrointestinal: Negative for abdominal distention, abdominal pain, blood in stool, constipation and nausea.  Endocrine: Negative for hot flashes.  Genitourinary: Negative for difficulty urinating and frequency.   Musculoskeletal: Negative for arthralgias.  Skin: Negative for itching and rash.  Neurological: Negative for extremity weakness and light-headedness.  Hematological: Negative for adenopathy.  Psychiatric/Behavioral: Negative for confusion and sleep disturbance.    MEDICAL HISTORY:  Past Medical History:  Diagnosis Date  . AKI (acute kidney injury) ([place]) 08/17/2018  . Anxiety   . Arthritis   . Belching   . Bladder disorder    OVERACTIVE  . Bowel dysfunction    BLOCKAGE  . Cancer (HCC)    breast  . Constipation   . Depression   . Diverticulitis   . Fibromyalgia   . GERD (gastroesophageal reflux disease)   . Hyperlipidemia   . IBS (irritable bowel syndrome)   . Internal hemorrhoids   . Lung cancer ([place]) 2020  . Memory deficits   . Murmur    asymptomatic  . Pneumonia 11/18/12  . Urinary incontinence   . Vertigo     SURGICAL HISTORY: Past Surgical History:  Procedure Laterality Date  . AXILLARY LYMPH NODE BIOPSY Left 06/08/2018   INVASIVE MAMMARY CARCINOMA  . BLADDER SUSPENSION  2004, 2012  . BREAST BIOPSY Left 06/08/2018   INVASIVE MAMMARY CARCINOMA  . CATARACT EXTRACTION W/PHACO Right 08/27/2015   Procedure: CATARACT EXTRACTION PHACO AND INTRAOCULAR LENS PLACEMENT (IOC);  Surgeon: Estill Cotta, MD;  Location: [facility] ORS;  Service: Ophthalmology;  Laterality: Right;  [place]   1:00.2 AP%  22.5 CDE  23.67 fluid casette lot #[id number]  exp05/31/2018  . CATARACT EXTRACTION W/PHACO Left 10/15/2015   Procedure: CATARACT EXTRACTION PHACO AND INTRAOCULAR  LENS PLACEMENT (IOC);  Surgeon: Estill Cotta, MD;  Location: [facility] ORS;  Service: Ophthalmology;  Laterality: Left;  [place] 01:07 AP% 18.1 CDE 21.57 fluid pack lot # [id number]  . CHOLECYSTECTOMY    . COLONOSCOPY  2017  . ELECTROMAGNETIC NAVIGATION BROCHOSCOPY N/A 07/09/2018   Procedure: ELECTROMAGNETIC NAVIGATION BRONCHOSCOPY;  Surgeon: Flora Lipps, MD;  Location: [facility] ORS;  Service: Cardiopulmonary;  Laterality: N/A;  . TONSILLECTOMY  1947    SOCIAL HISTORY: Social History   Socioeconomic History  . Marital status: Married    Spouse name: Bennye Alm  . Number of children: 0  . Years of education: Master's Degree  . Highest education level: Not on file  Occupational History  . Occupation: Retired  Tobacco Use  . Smoking status: Never Smoker  . Smokeless tobacco: Never Used  Vaping Use  . Vaping Use: Never used  Substance and Sexual Activity  . Alcohol use: Not Currently    Comment: rare wine  . Drug use: No  . Sexual activity: Not Currently  Other Topics Concern  . Not on file  Social History Narrative   Recently moved with her husband to [place] from [place].   Husband is a retired Pharmacist, community.   No children.   She is a retired Pharmacist, hospital.   Enjoys: Chief Technology Officer, reading - mysteries and biographies, cooking   Exercise: walking, gardening   Diet: low appetite due to cancer treatment, grazing   She is a DNR.   Social Determinants of  Health   Financial Resource Strain: Low Risk   . Difficulty of Paying Living Expenses: Not hard at all  Food Insecurity: No Food Insecurity  . Worried About Charity fundraiser in the Last Year: Never true  . Ran Out of Food in the Last Year: Never true  Transportation Needs: No Transportation Needs  . Lack of Transportation (Medical): No  . Lack of Transportation (Non-Medical): No  Physical Activity: Inactive  . Days of Exercise per Week: 0 days  . Minutes of Exercise per Session: 0 min  Stress: No Stress Concern Present  . Feeling of  Stress : Not at all  Social Connections:   . Frequency of Communication with Friends and Family: Not on file  . Frequency of Social Gatherings with Friends and Family: Not on file  . Attends Religious Services: Not on file  . Active Member of Clubs or Organizations: Not on file  . Attends Archivist Meetings: Not on file  . Marital Status: Not on file  Intimate Partner Violence: Not At Risk  . Fear of Current or Ex-Partner: No  . Emotionally Abused: No  . Physically Abused: No  . Sexually Abused: No    FAMILY HISTORY: Family History  Problem Relation Age of Onset  . Diabetes Father   . Heart disease Father   . Lymphoma Father   . Heart disease Mother   . Breast cancer Sister 43  . Colon cancer Neg Hx   . Esophageal cancer Neg Hx   . Rectal cancer Neg Hx   . Stomach cancer Neg Hx   . Bladder Cancer Neg Hx   . Kidney cancer Neg Hx     ALLERGIES:  is allergic to sulfa antibiotics.  MEDICATIONS:  Current Outpatient Medications  Medication Sig Dispense Refill  . acetaminophen (TYLENOL) 325 MG tablet Take 325 mg by mouth every 6 (six) hours as needed for mild pain.     [name] anastrozole (ARIMIDEX) 1 MG tablet TAKE 1 TABLET BY MOUTH DAILY 30 tablet 3  . atorvastatin (LIPITOR) 20 MG tablet Take 1 tablet (20 mg total) by mouth at bedtime. 90 tablet 0  . buPROPion (WELLBUTRIN XL) 150 MG 24 hr tablet TAKE ONE TABLET BY MOUTH EVERY DAY (Patient taking differently: Take 150 mg by mouth daily. ) 90 tablet 2  . Calcium-Magnesium-Vitamin D (CALCIUM 1200+D3 PO) Take 1 tablet by mouth daily.    [name] desonide (DESOWEN) 0.05 % lotion Apply 1 application topically as needed (after showering).     [name] docusate sodium (COLACE) 100 MG capsule Take 1 capsule (100 mg total) by mouth daily. (Patient taking differently: Take 100 mg by mouth daily as needed. ) 90 capsule 0  . ketoconazole (NIZORAL) 2 % shampoo Apply 1 application topically as needed. 3 times a week    . levothyroxine (SYNTHROID) 25  MCG tablet Take 1 tablet (25 mcg total) by mouth daily. 30 tablet 1  . lubiprostone (AMITIZA) 24 MCG capsule TAKE ONE CAPSULE TWICE A DAY WITH MEALS (Patient taking differently: Take 24 mcg by mouth 2 (two) times daily with a meal. TAKE ONE CAPSULE TWICE A DAY WITH MEALS) 60 capsule 3  . memantine (NAMENDA) 10 MG tablet TAKE ONE TABLET BY MOUTH TWICE DAILY (Patient taking differently: Take 10 mg by mouth 2 (two) times daily. ) 180 tablet 2  . mirabegron ER (MYRBETRIQ) 50 MG TB24 tablet Take 1 tablet (50 mg total) by mouth daily. 90 tablet 1  . Multiple Vitamin (MULTIVITAMIN  WITH MINERALS) TABS tablet Take 1 tablet by mouth daily.    . polyethylene glycol (MIRALAX / GLYCOLAX) packet Take 17 g by mouth daily as needed.     . sucralfate (CARAFATE) 1 g tablet Take 1 tablet (1 g total) by mouth 2 (two) times daily. (Patient taking differently: Take 1 g by mouth 2 (two) times daily as needed. ) 60 tablet 0  . venlafaxine XR (EFFEXOR-XR) 150 MG 24 hr capsule TAKE 1 CAPSULE BY MOUTH EVERY DAY 90 capsule 1  . Vitamin D, Ergocalciferol, (DRISDOL) 1.25 MG (50000 UNIT) CAPS capsule Take 50,000 Units by mouth once a week.    [name] XALKORI 250 MG capsule TAKE 1 CAPSULE (250 MG TOTAL) BY MOUTH 2 TIMES DAILY. 60 capsule 2  . Dextromethorphan-guaiFENesin (MUCINEX DM) 30-600 MG TB12 Take 1 tablet by mouth 2 (two) times daily as needed (for congestion/cough).  (Patient not taking: Reported on 01/23/2020)    . fluticasone (FLONASE) 50 MCG/ACT nasal spray Place 2 sprays into both nostrils daily. (Patient not taking: Reported on 01/31/2020) 16 g 6  . omeprazole (PRILOSEC) 40 MG capsule Take 40 mg by mouth daily. (Patient not taking: Reported on 01/23/2020)    . ondansetron (ZOFRAN) 8 MG tablet TAKE ONE TABLET EVERY EIGHT HOURS AS NEEDED FOR NAUSEA / VOMITING (Patient not taking: Reported on 01/31/2020) 60 tablet 1  . triamcinolone (NASACORT) 55 MCG/ACT AERO nasal inhaler Place 2 sprays into the nose daily as needed (for congestion).   (Patient not taking: Reported on 01/23/2020)     No current facility-administered medications for this visit.     PHYSICAL EXAMINATION: ECOG PERFORMANCE STATUS: 2 - Symptomatic, <50% confined to bed Vitals:   01/31/20 0939  BP: (!) 101/56  Pulse: (!) 57  Resp: 16  Temp: 97.7 F (36.5 C)   Filed Weights   01/31/20 0939  Weight: 150 lb 12.8 oz (68.4 kg)    Physical Exam Constitutional:      General: She is not in acute distress.    Appearance: She is not diaphoretic.     Comments: Frail female, walks with a walker  HENT:     Head: Normocephalic and atraumatic.     Nose: Nose normal.     Mouth/Throat:     Pharynx: No oropharyngeal exudate.  Eyes:     General: No scleral icterus.    Pupils: Pupils are equal, round, and reactive to light.  Cardiovascular:     Rate and Rhythm: Normal rate and regular rhythm.     Heart sounds: No murmur heard.   Pulmonary:     Effort: Pulmonary effort is normal. No respiratory distress.     Breath sounds: No wheezing or rales.  Chest:     Chest wall: No tenderness.  Abdominal:     General: There is no distension.     Palpations: Abdomen is soft.     Tenderness: There is no abdominal tenderness.  Musculoskeletal:        General: Normal range of motion.     Cervical back: Normal range of motion and neck supple.     Comments: Bilateral lower extremity 2+ edema  Skin:    General: Skin is warm and dry.     Findings: No erythema.  Neurological:     Mental Status: She is alert and oriented to person, place, and time.     Cranial Nerves: No cranial nerve deficit.     Motor: No abnormal muscle tone.     Coordination: Coordination  normal.  Psychiatric:        Mood and Affect: Affect normal.      CMP Latest Ref Rng & Units 01/31/2020  Glucose 70 - 99 mg/dL 102(H)  BUN 8 - 23 mg/dL 23  Creatinine 0.44 - 1.00 mg/dL 1.13(H)  Sodium 135 - 145 mmol/L 140  Potassium 3.5 - 5.1 mmol/L 4.1  Chloride 98 - 111 mmol/L 107  CO2 22 - 32 mmol/L 26   Calcium 8.9 - 10.3 mg/dL 7.5(L)  Total Protein 6.5 - 8.1 g/dL 5.8(L)  Total Bilirubin 0.3 - 1.2 mg/dL 0.7  Alkaline Phos 38 - 126 U/L 82  AST 15 - 41 U/L 25  ALT 0 - 44 U/L 25   CBC Latest Ref Rng & Units 01/31/2020  WBC 4.0 - 10.5 K/uL 5.3  Hemoglobin 12.0 - 15.0 g/dL 11.2(L)  Hematocrit 36 - 46 % 33.4(L)  Platelets 150 - 400 K/uL 289    LABORATORY DATA:  I have reviewed the data as listed Lab Results  Component Value Date   WBC 5.3 01/31/2020   HGB 11.2 (L) 01/31/2020   HCT 33.4 (L) 01/31/2020   MCV 97.1 01/31/2020   PLT 289 01/31/2020   Recent Labs    11/30/19 1426 11/30/19 1817 12/01/19 1316 12/01/19 1316 12/05/19 1200 01/02/20 0832 01/31/20 0917  NA 133*   < > 134*   < > 137 142 140  K 3.9   < > 4.2   < > 4.5 5.0 4.1  CL 95*   < > 100   < > 104 105 107  CO2 27   < > 27   < > '27 29 26  ' GLUCOSE 115*   < > 109*   < > 83 132* 102*  BUN 44*   < > 33*   < > 30* 19 23  CREATININE 1.42*   < > 1.23*   < > 1.28* 0.95 1.13*  CALCIUM 6.9*   < > 7.2*   < > 6.9* 8.7* 7.5*  GFRNONAA 35*   < > 41*  --   --  56* 46*  GFRAA 40*   < > 48*  --   --  >60 53*  PROT 6.5  --   --   --   --  6.5 5.8*  ALBUMIN 3.5  --   --   --   --  3.5 3.3*  AST 33  --   --   --   --  25 25  ALT 41  --   --   --   --  24 25  ALKPHOS 96  --   --   --   --  92 82  BILITOT 0.6  --   --   --   --  0.7 0.7   < > = values in this interval not displayed.   Iron/TIBC/Ferritin/ %Sat No results found for: IRON, TIBC, FERRITIN, IRONPCTSAT   RADIOGRAPHIC STUDIES: I have personally reviewed the radiological images as listed and agreed with the findings in the report. CT Abdomen Pelvis Wo Contrast  Result Date: 01/26/2020 CLINICAL DATA:  Lung cancer and breast cancer follow-up. Constipation for 5 years. Lung and breast cancer 2 years ago with chemotherapy. No surgery. EXAM: CT ABDOMEN AND PELVIS WITHOUT CONTRAST TECHNIQUE: Multidetector CT imaging of the abdomen and pelvis was performed following the standard  protocol without IV contrast. COMPARISON:  10/21/2019 FINDINGS: Lower chest: Clear lung bases. Mild cardiomegaly, without pericardial or pleural effusion. Lad  coronary artery calcification. Hepatobiliary: Normal liver. Cholecystectomy, without biliary ductal dilatation. Pancreas: Normal, without mass or ductal dilatation. Spleen: Suspected capsular based splenic calcification is likely related to remote granulomatous infection. No splenomegaly. Adrenals/Urinary Tract: Normal adrenal glands. Right renal cortical thinning. Normal left kidney. No hydronephrosis. No bladder calculi. Stomach/Bowel: Normal stomach, without wall thickening. Colonic stool burden suggests constipation. Normal appendix. Normal small bowel. Vascular/Lymphatic: Aortic atherosclerosis. Mildly prominent bilateral inguinal nodes are similar, likely reactive. No abdominal adenopathy. Reproductive: Uterine atrophy versus partial hysterectomy. No adnexal mass. Other: No significant free fluid. No evidence of omental or peritoneal disease. Fat containing ventral abdominal wall hernia. Musculoskeletal: Osteopenia. Nonacute right pubic rami fractures. Remote lower right rib fractures. Mild compression deformities at T11 and T9. Lucency in the inferior endplate of T9 is similar. IMPRESSION: 1. No acute process or evidence of metastatic disease in the abdomen or pelvis. 2. Large colonic stool burden, likely indicative of constipation. 3. Coronary artery atherosclerosis. 4. Aortic Atherosclerosis (ICD10-I70.0). 5. Osteopenia with T11 and T9 compression deformities, similar. Lucency in the inferior aspect of the T9 vertebral body is unchanged, favoring Schmorl's node. Electronically Signed   By: Abigail Miyamoto M.D.   On: 01/26/2020 10:55   DG HIP UNILAT WITH PELVIS 1V LEFT  Result Date: 11/30/2019 CLINICAL DATA:  Left hip pain EXAM: DG HIP (WITH OR WITHOUT PELVIS) 1V*L* COMPARISON:  09/21/2019 FINDINGS: Alignment at the left hip is anatomic. There is no  acute fracture identified. Joint space is preserved. Chronic fractures of the right inferior and superior pubic rami again noted. IMPRESSION: No acute osseous abnormality. Electronically Signed   By: Macy Mis M.D.   On: 11/30/2019 17:58      ASSESSMENT & PLAN:  1. Malignant neoplasm of left female breast, unspecified estrogen receptor status, unspecified site of breast ([place])   2. Primary adenocarcinoma of lung, unspecified laterality ([place])   3. Encounter for antineoplastic chemotherapy   4. Aromatase inhibitor use   5. Hypocalcemia   6. Other constipation   Cancer Staging Malignant neoplasm of left female breast [facility]) Staging form: Breast, AJCC 8th Edition - Clinical stage from 12/17/2018: Stage IB (cT1b, cN1, cM0, G1, ER+, PR+, HER2-) - Signed by Earlie Server, MD on 12/17/2018  Primary adenocarcinoma of lung [facility]) Staging form: Lung, AJCC 8th Edition - Clinical stage from 09/28/2018: Stage IVA (cT2, cN3, cM1a) - Signed by Earlie Server, MD on 09/28/2018   Patient has 2 primaries.   Stage IV lung adenocarcinoma, met fusion mutation cT2 N3 M1a PDL 1 TPS more than 70%  Breast Cancer,  Her disease has been stable, CT chest abdomen pelvis was done which showed no metastatic disease.  For unknown reason, CT chest without contrast was not done.  Will reschedule. Labs are reviewed and discussed with patient. Continue crizotinib and Arimidex Labs were reviewed and discussed with patient.  #hypocalcemia, Corrected calcium is 8.1 today still low. I reemphasized the importance of patient's taking calcium supplementation and vitamin D supplementation. I will hold Xgeva today due to the hypercalcemia  #Constipation, continue MiraLAX daily as needed.  Recommend patient to try Colace 100 mg daily.  Follow-up 1 month All questions were answered. The patient knows to call the clinic with any problems questions or concerns.  Earlie Server, MD, PhD 01/31/2020

## 2020-02-01 ENCOUNTER — Ambulatory Visit: Payer: Medicare Other | Admitting: Family Medicine

## 2020-02-02 DIAGNOSIS — R278 Other lack of coordination: Secondary | ICD-10-CM | POA: Diagnosis not present

## 2020-02-02 DIAGNOSIS — M6281 Muscle weakness (generalized): Secondary | ICD-10-CM | POA: Diagnosis not present

## 2020-02-02 DIAGNOSIS — R296 Repeated falls: Secondary | ICD-10-CM | POA: Diagnosis not present

## 2020-02-02 DIAGNOSIS — R2689 Other abnormalities of gait and mobility: Secondary | ICD-10-CM | POA: Diagnosis not present

## 2020-02-03 ENCOUNTER — Other Ambulatory Visit: Payer: Self-pay | Admitting: Family Medicine

## 2020-02-08 DIAGNOSIS — R278 Other lack of coordination: Secondary | ICD-10-CM | POA: Diagnosis not present

## 2020-02-08 DIAGNOSIS — M6281 Muscle weakness (generalized): Secondary | ICD-10-CM | POA: Diagnosis not present

## 2020-02-08 DIAGNOSIS — R2689 Other abnormalities of gait and mobility: Secondary | ICD-10-CM | POA: Diagnosis not present

## 2020-02-08 DIAGNOSIS — R296 Repeated falls: Secondary | ICD-10-CM | POA: Diagnosis not present

## 2020-02-13 ENCOUNTER — Other Ambulatory Visit: Payer: Self-pay

## 2020-02-13 ENCOUNTER — Ambulatory Visit
Admission: RE | Admit: 2020-02-13 | Discharge: 2020-02-13 | Disposition: A | Discharge status: Home / Self Care | Payer: Medicare Other | Source: Ambulatory Visit | Attending: Oncology | Admitting: Oncology

## 2020-02-13 DIAGNOSIS — C3490 Malignant neoplasm of unspecified part of unspecified bronchus or lung: Secondary | ICD-10-CM

## 2020-02-13 DIAGNOSIS — C50912 Malignant neoplasm of unspecified site of left female breast: Secondary | ICD-10-CM

## 2020-02-16 DIAGNOSIS — R278 Other lack of coordination: Secondary | ICD-10-CM | POA: Diagnosis not present

## 2020-02-16 DIAGNOSIS — R296 Repeated falls: Secondary | ICD-10-CM | POA: Diagnosis not present

## 2020-02-16 DIAGNOSIS — M6281 Muscle weakness (generalized): Secondary | ICD-10-CM | POA: Diagnosis not present

## 2020-02-16 DIAGNOSIS — R2689 Other abnormalities of gait and mobility: Secondary | ICD-10-CM | POA: Diagnosis not present

## 2020-02-21 ENCOUNTER — Telehealth: Payer: Self-pay

## 2020-02-21 NOTE — Telephone Encounter (Signed)
Unable to reach pt at any contact # and left v/m on pts home # to call [facility]. FYI to Dr Sherrilyn Rist CMA and [facility] LPN.

## 2020-02-21 NOTE — Telephone Encounter (Signed)
Pt is seeing physical therapy with [facility] but could consider appointment with me or Dr. Lorelei Pont for knee pain

## 2020-02-21 NOTE — Telephone Encounter (Signed)
[name] - Client TELEPHONE ADVICE RECORD [facility] Patient Name: Jamie Matthews Gender: Female DOB: 03/22/1941 Age: 79 Y 27 M 19 D Return Phone Number: [phone] (Primary) Address: City/State/Zip: [place] [address] Client [facility] - Client Client Site [facility] - Day Physician Waunita Schooner- MD Contact Type Call Who Is Calling Patient / Member / Family / Caregiver Call Type Triage / Clinical Relationship To Patient Self Return Phone Number [phone] (Primary) Chief Complaint Walking difficulty Reason for Call Symptomatic / Request for [place] states she is calling because she is having trouble walking and is constipated Translation No Nurse Assessment Nurse: Joline Salt, RN, Malachy Mood Date/Time (Eastern Time): 02/21/2020 12:10:42 PM Confirm and document reason for call. If symptomatic, describe symptoms. ---Caller states she is calling because she is having trouble walking and is constipated. The right knee has arthritis. It has been getting. Does the patient have any new or worsening symptoms? ---Yes Will a triage be completed? ---Yes Related visit to physician within the last 2 weeks? ---No Does the PT have any chronic conditions? (i.e. diabetes, asthma, this includes High risk factors for pregnancy, etc.) ---No Is this a behavioral health or substance abuse call? ---No Guidelines Guideline Title Affirmed Question Affirmed Notes Nurse Date/Time Eilene Ghazi Time) Knee Pain [1] MODERATE pain (e.g., interferes with normal activities, limping) AND [2] present > 3 days Catha Brow 02/21/2020 12:12:50 PM Disp. Time Eilene Ghazi Time) Disposition Final User 02/21/2020 12:16:39 PM SEE PCP WITHIN 3 DAYS Yes Joline Salt, RN, Erskine Speed Disagree/Comply Comply Caller Understands Yes PLEASE NOTE: All timestamps contained within this report are  represented as [place] Standard Time. CONFIDENTIALTY NOTICE: This fax transmission is intended only for the addressee. It contains information that is legally privileged, confidential or otherwise protected from use or disclosure. If you are not the intended recipient, you are strictly prohibited from reviewing, disclosing, copying using or disseminating any of this information or taking any action in reliance on or regarding this information. If you have received this fax in error, please notify us immediately by telephone so that we can arrange for its return to [place]. Phone: [phone], Toll-Free: [phone], Fax: [phone] Page: 2 of 2 Call Id: [id number] PreDisposition Did not know what to do Care Advice Given Per Guideline SEE PCP WITHIN 3 DAYS: * You need to be seen within 2 or 3 days. REST YOUR KNEE FOR THE NEXT COUPLE DAYS: * Avoid activities that worsen your pain. LOCAL HEAT: * Apply a warm washcloth or heating pad for 10 minutes three times daily. * This will help to increase circulation and improve healing. PAIN MEDICINES: * For pain relief, you can take either acetaminophen, ibuprofen, or naproxen. CALL BACK IF: * You become worse Comments User: Margretta Ditty, RN Date/Time Eilene Ghazi Time): 02/21/2020 12:13:10 PM Thyroid Referrals REFERRED TO PCP OFFICE

## 2020-02-28 ENCOUNTER — Inpatient Hospital Stay: Payer: Medicare Other

## 2020-02-28 ENCOUNTER — Inpatient Hospital Stay: Payer: Medicare Other | Admitting: Oncology

## 2020-02-29 ENCOUNTER — Other Ambulatory Visit: Payer: Self-pay | Admitting: Family Medicine

## 2020-02-29 DIAGNOSIS — E038 Other specified hypothyroidism: Secondary | ICD-10-CM

## 2020-02-29 MED FILL — XALKORI 250 MG CAPSULE: 250 | 30 days supply | Qty: 60 | Fill #1

## 2020-03-08 DIAGNOSIS — R296 Repeated falls: Secondary | ICD-10-CM | POA: Diagnosis not present

## 2020-03-08 DIAGNOSIS — R278 Other lack of coordination: Secondary | ICD-10-CM | POA: Diagnosis not present

## 2020-03-08 DIAGNOSIS — R2689 Other abnormalities of gait and mobility: Secondary | ICD-10-CM | POA: Diagnosis not present

## 2020-03-08 DIAGNOSIS — M6281 Muscle weakness (generalized): Secondary | ICD-10-CM | POA: Diagnosis not present

## 2020-03-20 DIAGNOSIS — R278 Other lack of coordination: Secondary | ICD-10-CM | POA: Diagnosis not present

## 2020-03-20 DIAGNOSIS — M6281 Muscle weakness (generalized): Secondary | ICD-10-CM | POA: Diagnosis not present

## 2020-03-20 DIAGNOSIS — R2689 Other abnormalities of gait and mobility: Secondary | ICD-10-CM | POA: Diagnosis not present

## 2020-03-20 DIAGNOSIS — R296 Repeated falls: Secondary | ICD-10-CM | POA: Diagnosis not present

## 2020-03-22 DIAGNOSIS — R278 Other lack of coordination: Secondary | ICD-10-CM | POA: Diagnosis not present

## 2020-03-22 DIAGNOSIS — R2689 Other abnormalities of gait and mobility: Secondary | ICD-10-CM | POA: Diagnosis not present

## 2020-03-22 DIAGNOSIS — M6281 Muscle weakness (generalized): Secondary | ICD-10-CM | POA: Diagnosis not present

## 2020-03-22 DIAGNOSIS — R296 Repeated falls: Secondary | ICD-10-CM | POA: Diagnosis not present

## 2020-03-23 ENCOUNTER — Ambulatory Visit: Payer: Self-pay | Admitting: Urology

## 2020-03-26 ENCOUNTER — Ambulatory Visit: Payer: Medicare Other | Attending: Oncology

## 2020-03-29 DIAGNOSIS — R278 Other lack of coordination: Secondary | ICD-10-CM | POA: Diagnosis not present

## 2020-03-29 DIAGNOSIS — R296 Repeated falls: Secondary | ICD-10-CM | POA: Diagnosis not present

## 2020-03-29 DIAGNOSIS — M6281 Muscle weakness (generalized): Secondary | ICD-10-CM | POA: Diagnosis not present

## 2020-03-29 DIAGNOSIS — R2689 Other abnormalities of gait and mobility: Secondary | ICD-10-CM | POA: Diagnosis not present

## 2020-04-02 ENCOUNTER — Telehealth: Payer: Self-pay | Admitting: *Deleted

## 2020-04-02 ENCOUNTER — Inpatient Hospital Stay: Payer: Medicare Other

## 2020-04-02 ENCOUNTER — Inpatient Hospital Stay: Payer: Medicare Other | Admitting: Oncology

## 2020-04-02 NOTE — Telephone Encounter (Signed)
[facility] - Client TELEPHONE ADVICE RECORD [facility] Patient Name: Jamie Matthews EN Gender: Female DOB: [birth year] Age: 79 Y 42 M 30 D Return Phone Number: [phone] (Primary) Address: City/State/Zip: [place] [address] Client [facility] - Client Client Site [facility] Waunita Schooner- MD Contact Type Call Who Is Calling Patient / Member / Family / Caregiver Call Type Triage / Clinical Caller Name Derrell Lolling Relationship To Patient Spouse Return Phone Number [phone] (Primary) Chief Complaint Cough Reason for Call Symptomatic / Request for [place] states his wife is having productive cough. He is being treated for Lung Cancer. Translation No Nurse Assessment Nurse: Mancel Bale, RN, Butch Penny Date/Time Eilene Ghazi Time): 04/02/2020 9:21:58 AM Confirm and document reason for call. If symptomatic, describe symptoms. ---Caller states is having symptoms of productive cough that started one and half weeks ago. States his wife is taking oral chemotherapy for lung cancer. Denies temp. Does the patient have any new or worsening symptoms? ---Yes Will a triage be completed? ---Yes Related visit to physician within the last 2 weeks? ---No Does the PT have any chronic conditions? (i.e. diabetes, asthma, this includes High risk factors for pregnancy, etc.) ---Yes List chronic conditions. ---Lung Cancer and Breast Cancer- current oral chemo Is this a behavioral health or substance abuse call? ---No Guidelines Guideline Title Affirmed Question Affirmed Notes Nurse Date/Time (Eastern Time) Cough - Acute Productive Patient sounds very sick or weak to the triager Mancel Bale, RN, Butch Penny 04/02/2020 9:24:34 AM Disp. Time Eilene Ghazi Time) Disposition Final User 04/02/2020 9:04:08 AM Attempt made - line busy Wynetta Emery, RN, Baker Janus 04/02/2020 9:05:07 AM Attempt made - message  left Ivin Booty 04/02/2020 9:06:27 AM Attempt made - no message left Ivin Booty 04/02/2020 9:08:28 AM Send To RN Personal Mancel Bale, RN, Butch Penny 04/02/2020 9:30:41 AM Go to ED Now Yes Mancel Bale, RN, Annitta [place] NOTE: All timestamps contained within this report are represented as [place] Standard Time. CONFIDENTIALTY NOTICE: This fax transmission is intended only for the addressee. It contains information that is legally privileged, confidential or otherwise protected from use or disclosure. If you are not the intended recipient, you are strictly prohibited from reviewing, disclosing, copying using or disseminating any of this information or taking any action in reliance on or regarding this information. If you have received this fax in error, please notify us immediately by telephone so that we can arrange for its return to [place]. Phone: [phone], Toll-Free: [phone], Fax: [phone] Page: 2 of 2 Call Id: [id number] Disposition Overriden: Go to ED Now (or PCP triage) Override Reason: Patient's symptoms need a higher level of care Caller Disagree/Comply Disagree Caller Understands Yes PreDisposition Did not know what to do Care Advice Given Per Guideline CARE ADVICE given per Cough - Acute Productive (Adult) guideline. GO TO ED NOW: * You need to be seen in the Emergency Department. * Go to the ED at ___________ [place] now. Drive carefully. ANOTHER ADULT SHOULD DRIVE: * It is better and safer if another adult drives instead of you. BRING MEDICINES: * Bring a list of your current medicines when you go to the Emergency Department (ER). CALL EMS 911 IF: * Severe difficulty breathing occurs * Lips or face turns blue Comments User: Marijo Conception, RN Date/Time (Eastern Time): 04/02/2020 9:30:29 AM States they have had very bad experienced in the ED and do not want to go. Request to see MD in the office.  User: Marijo Conception, RN Date/Time Eilene Ghazi Time): 04/02/2020 9:35:53  AM Spoke with Raquel S[place] on [place], [phone], and she was given report. She stated she would have a nurse provide a call back to the patient and her husband. Referrals GO TO FACILITY REFUSED

## 2020-04-02 NOTE — Telephone Encounter (Signed)
Pt left message stating that has had productive cough for the past few weeks. No fever. Asking if needs to see oncologist to follow up on cough. Please advise.  Pt has scheduled appt today (11/8) at 2pm with Dr. Tasia Catchings.

## 2020-04-02 NOTE — Telephone Encounter (Signed)
She did not come today

## 2020-04-02 NOTE — Telephone Encounter (Signed)
Patient had called the office and was transferred to Access Nurse. Patient was advised by nurse that she needed to go to the ER because of her cough and she is being treated for cancer. Access nurse called the office and advised our front office that patient refused to go to the ER and that our office needed to contact the patient. Called and spoke to patient and was advised that she has had a productive cough white for almost two weeks. Patient denies headache, fever, chills, SOB, difficulty breathing or any other symptoms. Patient stated that she already has a virtual visit schedule with Dr. Einar Pheasant tomorrow and is not going to the ER. Patient was given ER precautions and verbalized understanding. Patient was advised to rest and drink plenty of fluids.

## 2020-04-02 NOTE — Telephone Encounter (Signed)
See entry in this phone note from R Laws LPN; pt already has 80/32/12 video visit with Dr Einar Pheasant scheduled.

## 2020-04-02 NOTE — Telephone Encounter (Signed)
Noted will see tomorrow

## 2020-04-03 ENCOUNTER — Encounter: Payer: Self-pay | Admitting: Family Medicine

## 2020-04-03 ENCOUNTER — Telehealth (INDEPENDENT_AMBULATORY_CARE_PROVIDER_SITE_OTHER): Payer: Medicare Other | Admitting: Family Medicine

## 2020-04-03 VITALS — BP 116/62 | HR 78 | Temp 97.7°F | Wt 141.0 lb

## 2020-04-03 DIAGNOSIS — R058 Other specified cough: Secondary | ICD-10-CM | POA: Diagnosis not present

## 2020-04-03 DIAGNOSIS — J309 Allergic rhinitis, unspecified: Secondary | ICD-10-CM

## 2020-04-03 DIAGNOSIS — R0982 Postnasal drip: Secondary | ICD-10-CM | POA: Diagnosis not present

## 2020-04-03 MED ORDER — FLUTICASONE PROPIONATE 50 MCG/ACT NA SUSP: 2.0000 | Freq: Every day | NASAL | 6 refills | Status: DC

## 2020-04-03 NOTE — Telephone Encounter (Signed)
Pt has been seen since this phone note.

## 2020-04-03 NOTE — Progress Notes (Addendum)
I connected with Jamie Matthews on 04/03/20 at 12:00 PM EST by video and verified that I am speaking with the correct person using two identifiers.   I discussed the limitations, risks, security and privacy concerns of performing an evaluation and management service by video and the availability of in person appointments. I also discussed with the patient that there may be a patient responsible charge related to this service. The patient expressed understanding and agreed to proceed.  Patient location: Home Provider Location: [place] Participants: Jamie Matthews and Floraine Buech[facility]    Subjective:     Jamie Matthews is a 79 y.o. female presenting for Cough (with yellow phlegm x 1 week ) and Nasal Congestion     Cough This is a new problem. The current episode started 1 to 4 weeks ago. The problem has been gradually worsening. The cough is productive of sputum. Associated symptoms include nasal congestion, postnasal drip, rhinorrhea and wheezing. Pertinent negatives include no chest pain, chills, ear congestion, ear pain, fever, headaches, sore throat or shortness of breath. lung cancer   Treatment: hydration, has not tried OTC medication  Used to use a saline rinse but has not tried it recently  Review of Systems  Constitutional: Negative for chills and fever.  HENT: Positive for postnasal drip and rhinorrhea. Negative for ear pain and sore throat.   Respiratory: Positive for cough and wheezing. Negative for shortness of breath.   Cardiovascular: Negative for chest pain.  Neurological: Negative for headaches.     Social History   Tobacco Use  Smoking Status Never Smoker  Smokeless Tobacco Never Used        Objective:   BP Readings from Last 3 Encounters:  04/03/20 116/62  01/31/20 (!) 101/56  01/19/20 122/60   Wt Readings from Last 3 Encounters:  04/03/20 141 lb (64 kg)  01/31/20 150 lb 12.8  oz (68.4 kg)  01/19/20 153 lb 8 oz (69.6 kg)   BP 116/62   Pulse 78   Temp 97.7 F (36.5 C) (Oral)   Wt 141 lb (64 kg)   SpO2 96%   BMI 24.98 kg/m    Physical Exam Constitutional:      Appearance: Normal appearance. She is not ill-appearing.  HENT:     Head: Normocephalic and atraumatic.     Right Ear: External ear normal.     Left Ear: External ear normal.  Eyes:     Conjunctiva/sclera: Conjunctivae normal.  Pulmonary:     Effort: Pulmonary effort is normal. No respiratory distress.  Neurological:     Mental Status: She is alert. Mental status is at baseline.  Psychiatric:        Mood and Affect: Mood normal.        Behavior: Behavior normal.        Thought Content: Thought content normal.        Judgment: Judgment normal.             Assessment & Plan:   Problem List Items Addressed This Visit    None    Visit Diagnoses    Allergic rhinitis, unspecified seasonality, unspecified trigger    -  Primary   Postnasal drip       Relevant Medications   fluticasone (FLONASE) 50 MCG/ACT nasal spray   Cough with sputum       Relevant Medications   fluticasone (FLONASE) 50 MCG/ACT nasal spray  Suspect allergic rhinnitis driven at this point. Pt with known lung cancer and on treatment.   Advised trial of saline rinse, allergy medication, and flonase  Call back if not improving in 2 days   Addendum: Pt husband had appt on 11/11 and notes wife is worsening. She had a CXR on 11/10 - final read not back - lung nodule from known cancer present, will f/u final read. He notes she does well with tessalon pearls - sent to pharmacy. Will also do trial of azithromycin x 5 days to see if that improves symptoms.   Return in about 2 days (around 04/05/2020).  Jamie Noe, MD

## 2020-04-04 ENCOUNTER — Ambulatory Visit
Admission: RE | Admit: 2020-04-04 | Discharge: 2020-04-04 | Disposition: A | Discharge status: Home / Self Care | Payer: Medicare Other | Source: Ambulatory Visit | Attending: Oncology | Admitting: Oncology

## 2020-04-04 ENCOUNTER — Inpatient Hospital Stay: Payer: Medicare Other

## 2020-04-04 ENCOUNTER — Encounter: Payer: Self-pay | Admitting: Oncology

## 2020-04-04 ENCOUNTER — Inpatient Hospital Stay (HOSPITAL_BASED_OUTPATIENT_CLINIC_OR_DEPARTMENT_OTHER): Payer: Medicare Other | Admitting: Oncology

## 2020-04-04 ENCOUNTER — Other Ambulatory Visit: Payer: Self-pay

## 2020-04-04 ENCOUNTER — Ambulatory Visit
Admission: RE | Admit: 2020-04-04 | Discharge: 2020-04-04 | Disposition: A | Discharge status: Home / Self Care | Payer: Medicare Other | Attending: Oncology | Admitting: Oncology

## 2020-04-04 ENCOUNTER — Telehealth: Payer: Self-pay | Admitting: *Deleted

## 2020-04-04 ENCOUNTER — Inpatient Hospital Stay: Payer: Medicare Other | Attending: Oncology

## 2020-04-04 VITALS — BP 109/64 | HR 61 | Temp 97.9°F | Resp 16 | Wt 142.4 lb

## 2020-04-04 DIAGNOSIS — K219 Gastro-esophageal reflux disease without esophagitis: Secondary | ICD-10-CM | POA: Insufficient documentation

## 2020-04-04 DIAGNOSIS — R110 Nausea: Secondary | ICD-10-CM | POA: Diagnosis not present

## 2020-04-04 DIAGNOSIS — R059 Cough, unspecified: Secondary | ICD-10-CM | POA: Diagnosis not present

## 2020-04-04 DIAGNOSIS — C3490 Malignant neoplasm of unspecified part of unspecified bronchus or lung: Secondary | ICD-10-CM | POA: Diagnosis not present

## 2020-04-04 DIAGNOSIS — N179 Acute kidney failure, unspecified: Secondary | ICD-10-CM

## 2020-04-04 DIAGNOSIS — S2231XA Fracture of one rib, right side, initial encounter for closed fracture: Secondary | ICD-10-CM | POA: Diagnosis not present

## 2020-04-04 DIAGNOSIS — Z9049 Acquired absence of other specified parts of digestive tract: Secondary | ICD-10-CM | POA: Insufficient documentation

## 2020-04-04 DIAGNOSIS — Z833 Family history of diabetes mellitus: Secondary | ICD-10-CM | POA: Diagnosis not present

## 2020-04-04 DIAGNOSIS — Z79811 Long term (current) use of aromatase inhibitors: Secondary | ICD-10-CM | POA: Diagnosis not present

## 2020-04-04 DIAGNOSIS — C3492 Malignant neoplasm of unspecified part of left bronchus or lung: Secondary | ICD-10-CM

## 2020-04-04 DIAGNOSIS — C50912 Malignant neoplasm of unspecified site of left female breast: Secondary | ICD-10-CM

## 2020-04-04 DIAGNOSIS — R600 Localized edema: Secondary | ICD-10-CM | POA: Insufficient documentation

## 2020-04-04 DIAGNOSIS — Z882 Allergy status to sulfonamides status: Secondary | ICD-10-CM | POA: Insufficient documentation

## 2020-04-04 DIAGNOSIS — I2510 Atherosclerotic heart disease of native coronary artery without angina pectoris: Secondary | ICD-10-CM | POA: Diagnosis not present

## 2020-04-04 DIAGNOSIS — C3412 Malignant neoplasm of upper lobe, left bronchus or lung: Secondary | ICD-10-CM | POA: Insufficient documentation

## 2020-04-04 DIAGNOSIS — C50012 Malignant neoplasm of nipple and areola, left female breast: Secondary | ICD-10-CM | POA: Diagnosis not present

## 2020-04-04 DIAGNOSIS — M1990 Unspecified osteoarthritis, unspecified site: Secondary | ICD-10-CM | POA: Insufficient documentation

## 2020-04-04 DIAGNOSIS — M7989 Other specified soft tissue disorders: Secondary | ICD-10-CM | POA: Insufficient documentation

## 2020-04-04 DIAGNOSIS — C773 Secondary and unspecified malignant neoplasm of axilla and upper limb lymph nodes: Secondary | ICD-10-CM | POA: Diagnosis not present

## 2020-04-04 DIAGNOSIS — Z5111 Encounter for antineoplastic chemotherapy: Secondary | ICD-10-CM

## 2020-04-04 DIAGNOSIS — K589 Irritable bowel syndrome without diarrhea: Secondary | ICD-10-CM | POA: Insufficient documentation

## 2020-04-04 DIAGNOSIS — Z8249 Family history of ischemic heart disease and other diseases of the circulatory system: Secondary | ICD-10-CM | POA: Insufficient documentation

## 2020-04-04 DIAGNOSIS — I700 Atherosclerosis of aorta: Secondary | ICD-10-CM | POA: Insufficient documentation

## 2020-04-04 DIAGNOSIS — E785 Hyperlipidemia, unspecified: Secondary | ICD-10-CM | POA: Diagnosis not present

## 2020-04-04 DIAGNOSIS — Z8719 Personal history of other diseases of the digestive system: Secondary | ICD-10-CM | POA: Insufficient documentation

## 2020-04-04 DIAGNOSIS — Z808 Family history of malignant neoplasm of other organs or systems: Secondary | ICD-10-CM | POA: Insufficient documentation

## 2020-04-04 DIAGNOSIS — M8580 Other specified disorders of bone density and structure, unspecified site: Secondary | ICD-10-CM | POA: Diagnosis not present

## 2020-04-04 DIAGNOSIS — Z79899 Other long term (current) drug therapy: Secondary | ICD-10-CM | POA: Insufficient documentation

## 2020-04-04 DIAGNOSIS — R0989 Other specified symptoms and signs involving the circulatory and respiratory systems: Secondary | ICD-10-CM | POA: Diagnosis not present

## 2020-04-04 DIAGNOSIS — Z170 Estrogen receptor positive status [ER+]: Secondary | ICD-10-CM | POA: Insufficient documentation

## 2020-04-04 DIAGNOSIS — Z803 Family history of malignant neoplasm of breast: Secondary | ICD-10-CM | POA: Insufficient documentation

## 2020-04-04 LAB — COMPREHENSIVE METABOLIC PANEL
ALT: 25 U/L (ref 0–44)
AST: 24 U/L (ref 15–41)
Albumin: 3.6 g/dL (ref 3.5–5.0)
Alkaline Phosphatase: 68 U/L (ref 38–126)
Anion gap: 8 (ref 5–15)
BUN: 23 mg/dL (ref 8–23)
CO2: 29 mmol/L (ref 22–32)
Calcium: 8.8 mg/dL — ABNORMAL LOW (ref 8.9–10.3)
Chloride: 103 mmol/L (ref 98–111)
Creatinine, Ser: 1.08 mg/dL — ABNORMAL HIGH (ref 0.44–1.00)
GFR, Estimated: 52 mL/min — ABNORMAL LOW (ref >60)
Glucose, Bld: 100 mg/dL — ABNORMAL HIGH (ref 70–99)
Potassium: 4.5 mmol/L (ref 3.5–5.1)
Sodium: 140 mmol/L (ref 135–145)
Total Bilirubin: 0.6 mg/dL (ref 0.3–1.2)
Total Protein: 6.7 g/dL (ref 6.5–8.1)

## 2020-04-04 LAB — CBC WITH DIFFERENTIAL/PLATELET
Abs Immature Granulocytes: 0.11 10*3/uL — ABNORMAL HIGH (ref 0.00–0.07)
Basophils Absolute: 0.1 10*3/uL (ref 0.0–0.1)
Basophils Relative: 1 %
Eosinophils Absolute: 2.7 10*3/uL — ABNORMAL HIGH (ref 0.0–0.5)
Eosinophils Relative: 29 %
HCT: 39 % (ref 36.0–46.0)
Hemoglobin: 12.9 g/dL (ref 12.0–15.0)
Immature Granulocytes: 1 %
Lymphocytes Relative: 18 %
Lymphs Abs: 1.6 10*3/uL (ref 0.7–4.0)
MCH: 32.3 pg (ref 26.0–34.0)
MCHC: 33.1 g/dL (ref 30.0–36.0)
MCV: 97.5 fL (ref 80.0–100.0)
Monocytes Absolute: 1.3 10*3/uL — ABNORMAL HIGH (ref 0.1–1.0)
Monocytes Relative: 15 %
Neutro Abs: 3.3 10*3/uL (ref 1.7–7.7)
Neutrophils Relative %: 36 %
Platelets: 257 10*3/uL (ref 150–400)
RBC: 4 MIL/uL (ref 3.87–5.11)
RDW: 14.2 % (ref 11.5–15.5)
WBC: 9 10*3/uL (ref 4.0–10.5)
nRBC: 0 % (ref 0.0–0.2)

## 2020-04-04 MED ORDER — DENOSUMAB 120 MG/1.7ML ~~LOC~~ SOLN
120.0000 mg | Freq: Once | SUBCUTANEOUS | Status: AC
  Administered 2020-04-04: 120 mg via SUBCUTANEOUS
  Filled 2020-04-04: qty 1.7

## 2020-04-04 NOTE — Progress Notes (Signed)
Hematology/Oncology follow up note [facility] [phone] [phone]   Patient Care Team: [name], MD as PCP - General (Family Medicine) Pyrtle, Lajuan Lines, MD as Consulting Physician (Gastroenterology) Telford Nab, RN as Registered Nurse Earlie Server, MD as Consulting Physician (Hematology and Oncology) Debbora Dus, [facility] as Pharmacist (Pharmacist) Burnice Logan, [facility] as Pharmacist (Pharmacist) Ralene Bathe, MD (Dermatology) Festus Aloe, MD as Consulting Physician (Urology)   REASON FOR VISIT:  Follow up for management of lung cancer and breast cancer  HISTORY OF PRESENTING ILLNESS:  Jamie Matthews is a  79 y.o.  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 05/05/2018 bilateral diagnostic breast mammogram showed suspicious mass 1.1cm  in the 12:00 retroareolar region of the left breast and the left axillary lymph node. 06/08/2018 patient status post a left breast retroareolar and left axillary lymph node biopsy. Pathology showed invasive mammary carcinoma, no special type, grade 1, left axillary lymph node positive for invasive mammary carcinoma clinically metastatic.  Background lymph node architecture is not identified. ER> 90% PR> 90%, HER-2 negative.  PET scan done which unfortunately showed additional hypermetabolic bilateral hilar lymph nodes as well as left upper lobe lung mass which may represent a focus of metastatic disease from breast, or primary lung neoplasm.  There is multiple additional small pulmonary nodules are scattered throughout both lungs which are worrisome for metastasis.  Index nodule within the medial right upper lobe measures 7 mm,, peri-broncho-vascular nodule in the right lower lobe measures 1.2 cm.  # Stage IV lung cancer-  Biopsy of lung mass left upper lobe showed non-small cell lung cancer, favor adenocarcinoma.  #NGS came back patient has PD L1  is 70% TPS, MET fusion mutation.  #Mid-March 2020 started on crizotinib  Bilateral lower extremity edema, right >left, Ultrasound venous right 09/20/2018 no DVT.  2D echo 10/10/2018 showed LVEF 55 to 60% Most likely secondary to crizotinib side effects.  # 12/15/2018 CT chest images were independently reviewed and discussed with patient. Dominant left upper lobe nodule and multiple scattered irregular pulmonary parenchymal nodular lesions are stable. Mild basilar pulmonary parenchymal septal thickening is new and can be seen with pulmonary edema. Patient reports no shortness of breath asymptomatic  #12/29/2018 unilateral left diagnostic mammogram with ultrasound images were independently reviewed and discussed with patient. Slight interval reduction of the size of the left retroareolar breast mass, 9 x 6 x 7 mm, comparing to 10 x 9 x 8 mm. Axillary lymph node measured 1.3 x 1.0 x 1.0 cm, previously 2.2 x 1.4 x 1.5 cm,  there were 2 other abdominal lymph nodes in the left axilla, which were not reported in previous ultrasound. Discussed with patient that overall, breast cancer responded to endocrine treatments.  Additional 2 lymph nodes need to be closely monitored.  Recommend patient to continue Arimidex.   INTERVAL HISTORY Jamie Matthews is a 79 y.o. female who has above history reviewed by me today presents for acute visit for nausea, and constipation.  Patient no-showed to her previous appointment. Patient reports " congested, cough" for about the past 2 weeks.  She is a poor historian.  [facility] called and reports that patient was tested negative for COVID-19. Hepatitis fair.  Patient has lost 8 pounds since last visit.  Review of Systems  Constitutional: Negative for appetite change, chills, fatigue and fever.  HENT:   Negative for hearing loss and voice  change.        Congested  Eyes: Negative for eye problems.  Respiratory: Positive for cough.  Negative for chest tightness.   Cardiovascular: Positive for leg swelling. Negative for chest pain.  Gastrointestinal: Negative for abdominal distention, abdominal pain, blood in stool, constipation and nausea.  Endocrine: Negative for hot flashes.  Genitourinary: Negative for difficulty urinating and frequency.   Musculoskeletal: Negative for arthralgias.  Skin: Negative for itching and rash.  Neurological: Negative for extremity weakness and light-headedness.  Hematological: Negative for adenopathy.  Psychiatric/Behavioral: Negative for confusion and sleep disturbance.    MEDICAL HISTORY:  Past Medical History:  Diagnosis Date  . AKI (acute kidney injury) ([place]) 08/17/2018  . Anxiety   . Arthritis   . Belching   . Bladder disorder    OVERACTIVE  . Bowel dysfunction    BLOCKAGE  . Cancer (HCC)    breast  . Constipation   . Depression   . Diverticulitis   . Fibromyalgia   . GERD (gastroesophageal reflux disease)   . Hyperlipidemia   . IBS (irritable bowel syndrome)   . Internal hemorrhoids   . Lung cancer ([place]) 2020  . Memory deficits   . Murmur    asymptomatic  . Pneumonia 11/18/12  . Urinary incontinence   . Vertigo     SURGICAL HISTORY: Past Surgical History:  Procedure Laterality Date  . AXILLARY LYMPH NODE BIOPSY Left 06/08/2018   INVASIVE MAMMARY CARCINOMA  . BLADDER SUSPENSION  2004, 2012  . BREAST BIOPSY Left 06/08/2018   INVASIVE MAMMARY CARCINOMA  . CATARACT EXTRACTION W/PHACO Right 08/27/2015   Procedure: CATARACT EXTRACTION PHACO AND INTRAOCULAR LENS PLACEMENT (IOC);  Surgeon: Estill Cotta, MD;  Location: [facility] ORS;  Service: Ophthalmology;  Laterality: Right;  [place]   1:00.2 AP%  22.5 CDE  23.67 fluid casette lot #[id number]  exp05/31/2018  . CATARACT EXTRACTION W/PHACO Left 10/15/2015   Procedure: CATARACT EXTRACTION PHACO AND INTRAOCULAR LENS PLACEMENT (IOC);  Surgeon: Estill Cotta, MD;  Location: [facility] ORS;  Service: Ophthalmology;  Laterality:  Left;  [place] 01:07 AP% 18.1 CDE 21.57 fluid pack lot # [id number]  . CHOLECYSTECTOMY    . COLONOSCOPY  2017  . ELECTROMAGNETIC NAVIGATION BROCHOSCOPY N/A 07/09/2018   Procedure: ELECTROMAGNETIC NAVIGATION BRONCHOSCOPY;  Surgeon: Flora Lipps, MD;  Location: [facility] ORS;  Service: Cardiopulmonary;  Laterality: N/A;  . TONSILLECTOMY  1947    SOCIAL HISTORY: Social History   Socioeconomic History  . Marital status: Married    Spouse name: Bennye Alm  . Number of children: 0  . Years of education: Master's Degree  . Highest education level: Not on file  Occupational History  . Occupation: Retired  Tobacco Use  . Smoking status: Never Smoker  . Smokeless tobacco: Never Used  Vaping Use  . Vaping Use: Never used  Substance and Sexual Activity  . Alcohol use: Not Currently    Comment: rare wine  . Drug use: No  . Sexual activity: Not Currently  Other Topics Concern  . Not on file  Social History Narrative   Recently moved with her husband to [place] from [place].   Husband is a retired Pharmacist, community.   No children.   She is a retired Pharmacist, hospital.   Enjoys: Chief Technology Officer, reading - mysteries and biographies, cooking   Exercise: walking, gardening   Diet: low appetite due to cancer treatment, grazing   She is a DNR.   Social Determinants of Health   Financial Resource Strain: Low Risk   . Difficulty  of Paying Living Expenses: Not hard at all  Food Insecurity: No Food Insecurity  . Worried About Charity fundraiser in the Last Year: Never true  . Ran Out of Food in the Last Year: Never true  Transportation Needs: No Transportation Needs  . Lack of Transportation (Medical): No  . Lack of Transportation (Non-Medical): No  Physical Activity: Inactive  . Days of Exercise per Week: 0 days  . Minutes of Exercise per Session: 0 min  Stress: No Stress Concern Present  . Feeling of Stress : Not at all  Social Connections:   . Frequency of Communication with Friends and Family: Not on file  .  Frequency of Social Gatherings with Friends and Family: Not on file  . Attends Religious Services: Not on file  . Active Member of Clubs or Organizations: Not on file  . Attends Archivist Meetings: Not on file  . Marital Status: Not on file  Intimate Partner Violence: Not At Risk  . Fear of Current or Ex-Partner: No  . Emotionally Abused: No  . Physically Abused: No  . Sexually Abused: No    FAMILY HISTORY: Family History  Problem Relation Age of Onset  . Diabetes Father   . Heart disease Father   . Lymphoma Father   . Heart disease Mother   . Breast cancer Sister 47  . Colon cancer Neg Hx   . Esophageal cancer Neg Hx   . Rectal cancer Neg Hx   . Stomach cancer Neg Hx   . Bladder Cancer Neg Hx   . Kidney cancer Neg Hx     ALLERGIES:  is allergic to sulfa antibiotics.  MEDICATIONS:  Current Outpatient Medications  Medication Sig Dispense Refill  . acetaminophen (TYLENOL) 325 MG tablet Take 325 mg by mouth every 6 (six) hours as needed for mild pain.     . mirabegron ER (MYRBETRIQ) 50 MG TB24 tablet Take 1 tablet (50 mg total) by mouth daily. 90 tablet 1  . anastrozole (ARIMIDEX) 1 MG tablet TAKE 1 TABLET BY MOUTH DAILY 30 tablet 3  . atorvastatin (LIPITOR) 20 MG tablet TAKE 1 TABLET BY MOUTH NIGHTLY 90 tablet 0  . buPROPion (WELLBUTRIN XL) 150 MG 24 hr tablet TAKE ONE TABLET BY MOUTH EVERY DAY (Patient taking differently: Take 150 mg by mouth daily. ) 90 tablet 2  . Calcium-Magnesium-Vitamin D (CALCIUM 1200+D3 PO) Take 1 tablet by mouth daily.    [name] desonide (DESOWEN) 0.05 % lotion Apply 1 application topically as needed (after showering).     . Dextromethorphan-guaiFENesin (MUCINEX DM) 30-600 MG TB12 Take 1 tablet by mouth 2 (two) times daily as needed (for congestion/cough).     . docusate sodium (COLACE) 100 MG capsule Take 1 capsule (100 mg total) by mouth daily. (Patient taking differently: Take 100 mg by mouth daily as needed. ) 90 capsule 0  . fluticasone  (FLONASE) 50 MCG/ACT nasal spray Place 2 sprays into both nostrils daily. 16 g 6  . ketoconazole (NIZORAL) 2 % shampoo Apply 1 application topically as needed. 3 times a week    . levothyroxine (SYNTHROID) 25 MCG tablet TAKE 1 TABLET BY MOUTH DAILY 90 tablet 3  . lubiprostone (AMITIZA) 24 MCG capsule TAKE ONE CAPSULE TWICE A DAY WITH MEALS (Patient taking differently: Take 24 mcg by mouth 2 (two) times daily with a meal. TAKE ONE CAPSULE TWICE A DAY WITH MEALS) 60 capsule 3  . memantine (NAMENDA) 10 MG tablet TAKE ONE TABLET BY MOUTH  TWICE DAILY (Patient taking differently: Take 10 mg by mouth 2 (two) times daily. ) 180 tablet 2  . Multiple Vitamin (MULTIVITAMIN WITH MINERALS) TABS tablet Take 1 tablet by mouth daily.    [name] omeprazole (PRILOSEC) 40 MG capsule Take 40 mg by mouth daily. (Patient not taking: Reported on 01/23/2020)    . ondansetron (ZOFRAN) 8 MG tablet TAKE ONE TABLET EVERY EIGHT HOURS AS NEEDED FOR NAUSEA / VOMITING (Patient not taking: Reported on 01/31/2020) 60 tablet 1  . polyethylene glycol (MIRALAX / GLYCOLAX) packet Take 17 g by mouth daily as needed.     . sucralfate (CARAFATE) 1 g tablet Take 1 tablet (1 g total) by mouth 2 (two) times daily. (Patient taking differently: Take 1 g by mouth 2 (two) times daily as needed. ) 60 tablet 0  . venlafaxine XR (EFFEXOR-XR) 150 MG 24 hr capsule TAKE 1 CAPSULE BY MOUTH EVERY DAY 90 capsule 1  . Vitamin D, Ergocalciferol, (DRISDOL) 1.25 MG (50000 UNIT) CAPS capsule Take 50,000 Units by mouth once a week.    [name] XALKORI 250 MG capsule TAKE 1 CAPSULE (250 MG TOTAL) BY MOUTH 2 TIMES DAILY. 60 capsule 2   No current facility-administered medications for this visit.     PHYSICAL EXAMINATION: ECOG PERFORMANCE STATUS: 2 - Symptomatic, <50% confined to bed Vitals:   04/04/20 1319  BP: 109/64  Pulse: 61  Resp: 16  Temp: 97.9 F (36.6 C)   Filed Weights   04/04/20 1319  Weight: 142 lb 6.4 oz (64.6 kg)    Physical Exam Constitutional:       General: She is not in acute distress.    Appearance: She is not diaphoretic.     Comments: Frail female, walks with a walker  HENT:     Head: Normocephalic and atraumatic.     Nose: Nose normal.     Mouth/Throat:     Pharynx: No oropharyngeal exudate.  Eyes:     General: No scleral icterus.    Pupils: Pupils are equal, round, and reactive to light.  Cardiovascular:     Rate and Rhythm: Normal rate and regular rhythm.     Heart sounds: No murmur heard.   Pulmonary:     Effort: Pulmonary effort is normal. No respiratory distress.     Breath sounds: No wheezing or rales.  Chest:     Chest wall: No tenderness.  Abdominal:     General: There is no distension.     Palpations: Abdomen is soft.     Tenderness: There is no abdominal tenderness.  Musculoskeletal:        General: Normal range of motion.     Cervical back: Normal range of motion and neck supple.     Comments: Bilateral lower extremity 2+ edema  Skin:    General: Skin is warm and dry.     Findings: No erythema.  Neurological:     Mental Status: She is alert and oriented to person, place, and time.     Cranial Nerves: No cranial nerve deficit.     Motor: No abnormal muscle tone.     Coordination: Coordination normal.  Psychiatric:        Mood and Affect: Affect normal.      CMP Latest Ref Rng & Units 04/04/2020  Glucose 70 - 99 mg/dL 100(H)  BUN 8 - 23 mg/dL 23  Creatinine 0.44 - 1.00 mg/dL 1.08(H)  Sodium 135 - 145 mmol/L 140  Potassium 3.5 - 5.1 mmol/L 4.5  Chloride 98 - 111 mmol/L 103  CO2 22 - 32 mmol/L 29  Calcium 8.9 - 10.3 mg/dL 8.8(L)  Total Protein 6.5 - 8.1 g/dL 6.7  Total Bilirubin 0.3 - 1.2 mg/dL 0.6  Alkaline Phos 38 - 126 U/L 68  AST 15 - 41 U/L 24  ALT 0 - 44 U/L 25   CBC Latest Ref Rng & Units 04/04/2020  WBC 4.0 - 10.5 K/uL 9.0  Hemoglobin 12.0 - 15.0 g/dL 12.9  Hematocrit 36 - 46 % 39.0  Platelets 150 - 400 K/uL 257    LABORATORY DATA:  I have reviewed the data as listed Lab  Results  Component Value Date   WBC 9.0 04/04/2020   HGB 12.9 04/04/2020   HCT 39.0 04/04/2020   MCV 97.5 04/04/2020   PLT 257 04/04/2020   Recent Labs    12/01/19 1316 12/05/19 1200 01/02/20 0832 01/31/20 0917 04/04/20 1243  NA 134*   < > 142 140 140  K 4.2   < > 5.0 4.1 4.5  CL 100   < > 105 107 103  CO2 27   < > '29 26 29  ' GLUCOSE 109*   < > 132* 102* 100*  BUN 33*   < > '19 23 23  ' CREATININE 1.23*   < > 0.95 1.13* 1.08*  CALCIUM 7.2*   < > 8.7* 7.5* 8.8*  GFRNONAA 41*  --  56* 46* 52*  GFRAA 48*  --  >60 53*  --   PROT  --   --  6.5 5.8* 6.7  ALBUMIN  --   --  3.5 3.3* 3.6  AST  --   --  '25 25 24  ' ALT  --   --  '24 25 25  ' ALKPHOS  --   --  92 82 68  BILITOT  --   --  0.7 0.7 0.6   < > = values in this interval not displayed.   Iron/TIBC/Ferritin/ %Sat No results found for: IRON, TIBC, FERRITIN, IRONPCTSAT   RADIOGRAPHIC STUDIES: I have personally reviewed the radiological images as listed and agreed with the findings in the report. CT Abdomen Pelvis Wo Contrast  Result Date: 01/26/2020 CLINICAL DATA:  Lung cancer and breast cancer follow-up. Constipation for 5 years. Lung and breast cancer 2 years ago with chemotherapy. No surgery. EXAM: CT ABDOMEN AND PELVIS WITHOUT CONTRAST TECHNIQUE: Multidetector CT imaging of the abdomen and pelvis was performed following the standard protocol without IV contrast. COMPARISON:  10/21/2019 FINDINGS: Lower chest: Clear lung bases. Mild cardiomegaly, without pericardial or pleural effusion. Lad coronary artery calcification. Hepatobiliary: Normal liver. Cholecystectomy, without biliary ductal dilatation. Pancreas: Normal, without mass or ductal dilatation. Spleen: Suspected capsular based splenic calcification is likely related to remote granulomatous infection. No splenomegaly. Adrenals/Urinary Tract: Normal adrenal glands. Right renal cortical thinning. Normal left kidney. No hydronephrosis. No bladder calculi. Stomach/Bowel: Normal  stomach, without wall thickening. Colonic stool burden suggests constipation. Normal appendix. Normal small bowel. Vascular/Lymphatic: Aortic atherosclerosis. Mildly prominent bilateral inguinal nodes are similar, likely reactive. No abdominal adenopathy. Reproductive: Uterine atrophy versus partial hysterectomy. No adnexal mass. Other: No significant free fluid. No evidence of omental or peritoneal disease. Fat containing ventral abdominal wall hernia. Musculoskeletal: Osteopenia. Nonacute right pubic rami fractures. Remote lower right rib fractures. Mild compression deformities at T11 and T9. Lucency in the inferior endplate of T9 is similar. IMPRESSION: 1. No acute process or evidence of metastatic disease in the abdomen or pelvis. 2. Large colonic stool burden, likely  indicative of constipation. 3. Coronary artery atherosclerosis. 4. Aortic Atherosclerosis (ICD10-I70.0). 5. Osteopenia with T11 and T9 compression deformities, similar. Lucency in the inferior aspect of the T9 vertebral body is unchanged, favoring Schmorl's node. Electronically Signed   By: Abigail Miyamoto M.D.   On: 01/26/2020 10:55      ASSESSMENT & PLAN:  1. Cough   2. Malignant neoplasm of left female breast, unspecified estrogen receptor status, unspecified site of breast ([place])   3. Primary adenocarcinoma of lung, unspecified laterality ([place])   Cancer Staging Malignant neoplasm of left female breast [facility]) Staging form: Breast, AJCC 8th Edition - Clinical stage from 12/17/2018: Stage IB (cT1b, cN1, cM0, G1, ER+, PR+, HER2-) - Signed by Earlie Server, MD on 12/17/2018  Primary adenocarcinoma of lung [facility]) Staging form: Lung, AJCC 8th Edition - Clinical stage from 09/28/2018: Stage IVA (cT2, cN3, cM1a) - Signed by Earlie Server, MD on 09/28/2018   Patient has 2 primaries.   Stage IV lung adenocarcinoma, met fusion mutation cT2 N3 M1a PDL 1 TPS more than 70%  Breast Cancer,  continue crizotinib and aromatase inhibitor obtain CT chest to  monitor disease status.  Cough and congestion. Check x-ray  #hypocalcemia, Corrected calcium is 8.8 today improved.  Continue calcium supplementation and vitamin D supplementation. Patient will proceed with Delton See today.  Follow-up 1 month All questions were answered. The patient knows to call the clinic with any problems questions or concerns.  Earlie Server, MD, PhD 04/04/2020

## 2020-04-04 NOTE — Progress Notes (Signed)
Patient did not bring a medication list today and she is unsure of what medications she currently takes.  Reports a productive cough for the past 1.5 weeks.

## 2020-04-04 NOTE — Telephone Encounter (Signed)
Delane Ginger, RN at [facility] called to report that patient has been having a wet cough and that she has been tested for COVID and is Negative. The cough is keeping patient up at night She has an appointment this afternoon and wants physician to be aware of this as patient is not a good historian

## 2020-04-05 MED ORDER — BENZONATATE 100 MG PO CAPS: 100.0000 mg | ORAL_CAPSULE | Freq: Three times a day (TID) | ORAL | 0 refills | Status: DC | PRN

## 2020-04-05 MED ORDER — AZITHROMYCIN 250 MG PO TABS: ORAL_TABLET | ORAL | 0 refills | Status: DC

## 2020-04-05 NOTE — Addendum Note (Signed)
Addended by: [name] on: 04/05/2020 09:23 AM   Modules accepted: Orders

## 2020-04-17 ENCOUNTER — Telehealth: Payer: Self-pay

## 2020-04-17 NOTE — Telephone Encounter (Signed)
Do not think another course of antibiotics would be helpful.   Would recommend 1) tessalon perles 2) Honey   If worsening symptoms would recommend appointment. If stable cough and no other symptoms it could last for up to 6 weeks following a cold.

## 2020-04-17 NOTE — Telephone Encounter (Signed)
Mr Jamie Matthews left v/m that pt had video visit on 04/03/20; pt has lung CA; pt had a terrible cough and after Dr Einar Pheasant sending in abx (Azithromycin per med list) pt was a lot better even fantastic per pts husband's v/m. pts cough went away. Now cough has returned and Mr Jamie Matthews request refill of abx.

## 2020-04-18 ENCOUNTER — Other Ambulatory Visit: Payer: Self-pay | Admitting: Family Medicine

## 2020-04-18 ENCOUNTER — Ambulatory Visit: Payer: Medicare Other | Attending: Oncology

## 2020-04-18 NOTE — Telephone Encounter (Signed)
Error forwarding to you. Please disregard.

## 2020-04-18 NOTE — Telephone Encounter (Signed)
Not sure what we need to do. Please advise. EM

## 2020-04-18 NOTE — Telephone Encounter (Signed)
Unable to reach patient again X 2 to ascertain her well being.  I did contact the [facility] on call EMT, Larene Beach, who was going out to make a house call to check on patient and determine if she was okay or needs further care and evaluation at this point.  Larene Beach to call me back on my cell with and update and I will inform Debbie as appropriate.  Tor Netters, NP aware.

## 2020-04-18 NOTE — Telephone Encounter (Signed)
Pt states that she is having a productive cough, some wheezing, body aches, headache and feeling very fatigued.

## 2020-04-18 NOTE — Telephone Encounter (Signed)
Please call patient and find out if she is having fever/ chills, productive cough, wheeze or shortness of breath? What does she mean by feeling "horrible?" Any headache, body aches, nasal drainage?

## 2020-04-18 NOTE — Telephone Encounter (Signed)
Patient called in wanted to know if she can get a antibiotics for a bad cold she has.   [facility]

## 2020-04-18 NOTE — Telephone Encounter (Signed)
Attempted to call patient to check on her and ascertain whether or not she needs to be seen at urgent care. No answer at either number. Of note, she was a no show for chest CT today.

## 2020-04-18 NOTE — Telephone Encounter (Signed)
Patient called back stating that she is not any better. Patient is requesting another round of antibiotics. Please call her back at [phone].

## 2020-04-18 NOTE — Telephone Encounter (Signed)
Spoke to pt and she states that she feels horrible. Has been taking the tessalon pearls but no improvements. Would really like another round of antibiotics and states "it wouldn't hurt to try". She does not want to wait till next week to do another virtual. Please advise.

## 2020-04-23 ENCOUNTER — Telehealth (INDEPENDENT_AMBULATORY_CARE_PROVIDER_SITE_OTHER): Payer: Medicare Other | Admitting: Family Medicine

## 2020-04-23 ENCOUNTER — Telehealth: Payer: Self-pay

## 2020-04-23 ENCOUNTER — Telehealth: Payer: Self-pay | Admitting: *Deleted

## 2020-04-23 ENCOUNTER — Encounter: Payer: Self-pay | Admitting: Family Medicine

## 2020-04-23 DIAGNOSIS — J0140 Acute pansinusitis, unspecified: Secondary | ICD-10-CM | POA: Diagnosis not present

## 2020-04-23 MED ORDER — BENZONATATE 100 MG PO CAPS: 100.0000 mg | ORAL_CAPSULE | Freq: Three times a day (TID) | ORAL | 0 refills | Status: DC | PRN

## 2020-04-23 MED ORDER — AMOXICILLIN-POT CLAVULANATE 875-125 MG PO TABS: 1.0000 | ORAL_TABLET | Freq: Two times a day (BID) | ORAL | 0 refills | Status: AC

## 2020-04-23 MED FILL — XALKORI 250 MG CAPSULE: 250 | 30 days supply | Qty: 60 | Fill #2

## 2020-04-23 NOTE — Telephone Encounter (Signed)
Please contact patient and get the 04/18/20 CT rescheduled.

## 2020-04-23 NOTE — Progress Notes (Signed)
    I connected with Jamie Matthews on 04/23/20 at  4:20 PM EST by video and verified that I am speaking with the correct person using two identifiers.   I discussed the limitations, risks, security and privacy concerns of performing an evaluation and management service by video and the availability of in person appointments. I also discussed with the patient that there may be a patient responsible charge related to this service. The patient expressed understanding and agreed to proceed.  Patient location: Home Provider Location: [place] Participants: Jamie Matthews and Hadiya Spoerl Huizen   Subjective:     Jamie Matthews is a 79 y.o. female presenting for Cough and Headache     HPI  #Cough - feels "washed out" - bad cough - no fever/chills - no n/v/d - congestion endorsed, denies drainage - endorses some sneezing and runny nose - no face pain - cough occasionally productive - clear to tan mucus - husband was sick  - got antibiotics 11/11-11/16 - started feeling better on the medication and felt better for about 3 days afterwards - no loss of taste or smell - no SOB or trouble breathing - treatment: tessalon pearls were helping but not enough    Review of Systems   Social History   Tobacco Use  Smoking Status Never Smoker  Smokeless Tobacco Never Used        Objective:   BP Readings from Last 3 Encounters:  04/04/20 109/64  04/03/20 116/62  01/31/20 (!) 101/56   Wt Readings from Last 3 Encounters:  04/04/20 142 lb 6.4 oz (64.6 kg)  04/03/20 141 lb (64 kg)  01/31/20 150 lb 12.8 oz (68.4 kg)    There were no vitals taken for this visit.  Physical Exam Constitutional:      Appearance: Normal appearance. She is not ill-appearing.  HENT:     Head: Normocephalic and atraumatic.     Right Ear: External ear normal.     Left Ear: External ear normal.  Eyes:     Conjunctiva/sclera: Conjunctivae normal.    Pulmonary:     Effort: Pulmonary effort is normal. No respiratory distress.  Neurological:     Mental Status: She is alert. Mental status is at baseline.  Psychiatric:        Mood and Affect: Mood normal.        Behavior: Behavior normal.        Thought Content: Thought content normal.        Judgment: Judgment normal.          Assessment & Plan:   Problem List Items Addressed This Visit    None    Visit Diagnoses    Acute non-recurrent pansinusitis    -  Primary   Relevant Medications   amoxicillin-clavulanate (AUGMENTIN) 875-125 MG tablet   benzonatate (TESSALON PERLES) 100 MG capsule     Pt with lung cancer and active treatment with persistent cough/congestion. CXR in early November with clear lungs. Due for routine chest Ct f/u.   Will do treatment for possible sinusitis given persistence and congestion. Advised rescheduling her CT scan and f/u with me or oncology if cough persisting.    Return if symptoms worsen or fail to improve.  Jamie Noe, MD

## 2020-04-23 NOTE — Telephone Encounter (Signed)
See encounter from today.

## 2020-04-23 NOTE — Telephone Encounter (Signed)
She called and scheduled a virtual visit with Dr. Einar Pheasant today.

## 2020-04-23 NOTE — Telephone Encounter (Signed)
Patient called stating that she started with cold symptoms about a week ago. Patient stated that she has a headache, cough and body aches. Patient denies SOB or difficulty breathing. Patient stated that she needs an appointment. Patient was advised unfortunately we can not bring her into the office with her symptoms. A virtual visit was scheduled today with Dr. Einar Pheasant at 4:20 pm. Patient was given ER precautions.

## 2020-04-23 NOTE — Telephone Encounter (Signed)
Will you call and check on her?

## 2020-04-23 NOTE — Patient Instructions (Signed)
Take antibiotics  Eat yogurt  Tessalon pearl refills

## 2020-04-23 NOTE — Telephone Encounter (Signed)
See clinic note from today

## 2020-04-23 NOTE — Telephone Encounter (Signed)
-----   Message from Earlie Server, MD sent at 04/23/2020 12:46 PM EST ----- Regarding: RE: Missed CT appointment Thanks.  Raymie Giammarco, reschedule please. Thanks.  ----- Message ----- From: Patrici Ranks, RT Sent: 04/18/2020   3:28 PM EST To: Evelina Dun, RN, Earlie Server, MD Subject: Missed CT appointment                          As a courtesy we wanted to let you know patient did not show for her CT appointment today.  Thanks

## 2020-04-24 ENCOUNTER — Telehealth: Payer: Medicare Other | Admitting: Family Medicine

## 2020-04-25 NOTE — Telephone Encounter (Signed)
Please call patient to inform her of new appt date/time.

## 2020-04-25 NOTE — Telephone Encounter (Signed)
FYI... Just seen that Pts 04/18/20 CT has already been R/S  to 05/07/20.

## 2020-05-02 ENCOUNTER — Inpatient Hospital Stay: Payer: Medicare Other | Attending: Oncology

## 2020-05-02 ENCOUNTER — Encounter: Payer: Self-pay | Admitting: Oncology

## 2020-05-02 ENCOUNTER — Other Ambulatory Visit: Payer: Self-pay

## 2020-05-02 ENCOUNTER — Inpatient Hospital Stay (HOSPITAL_BASED_OUTPATIENT_CLINIC_OR_DEPARTMENT_OTHER): Payer: Medicare Other | Admitting: Oncology

## 2020-05-02 ENCOUNTER — Inpatient Hospital Stay: Payer: Medicare Other

## 2020-05-02 VITALS — BP 151/83 | HR 65 | Temp 97.7°F | Resp 18 | Wt 153.7 lb

## 2020-05-02 DIAGNOSIS — M7989 Other specified soft tissue disorders: Secondary | ICD-10-CM | POA: Diagnosis not present

## 2020-05-02 DIAGNOSIS — C3490 Malignant neoplasm of unspecified part of unspecified bronchus or lung: Secondary | ICD-10-CM | POA: Diagnosis not present

## 2020-05-02 DIAGNOSIS — Z803 Family history of malignant neoplasm of breast: Secondary | ICD-10-CM | POA: Diagnosis not present

## 2020-05-02 DIAGNOSIS — C3412 Malignant neoplasm of upper lobe, left bronchus or lung: Secondary | ICD-10-CM | POA: Insufficient documentation

## 2020-05-02 DIAGNOSIS — M8580 Other specified disorders of bone density and structure, unspecified site: Secondary | ICD-10-CM

## 2020-05-02 DIAGNOSIS — R059 Cough, unspecified: Secondary | ICD-10-CM

## 2020-05-02 DIAGNOSIS — Z833 Family history of diabetes mellitus: Secondary | ICD-10-CM | POA: Diagnosis not present

## 2020-05-02 DIAGNOSIS — E785 Hyperlipidemia, unspecified: Secondary | ICD-10-CM | POA: Diagnosis not present

## 2020-05-02 DIAGNOSIS — K219 Gastro-esophageal reflux disease without esophagitis: Secondary | ICD-10-CM | POA: Diagnosis not present

## 2020-05-02 DIAGNOSIS — Z9049 Acquired absence of other specified parts of digestive tract: Secondary | ICD-10-CM | POA: Insufficient documentation

## 2020-05-02 DIAGNOSIS — R600 Localized edema: Secondary | ICD-10-CM | POA: Diagnosis not present

## 2020-05-02 DIAGNOSIS — Z79811 Long term (current) use of aromatase inhibitors: Secondary | ICD-10-CM | POA: Insufficient documentation

## 2020-05-02 DIAGNOSIS — Z882 Allergy status to sulfonamides status: Secondary | ICD-10-CM | POA: Diagnosis not present

## 2020-05-02 DIAGNOSIS — Z79899 Other long term (current) drug therapy: Secondary | ICD-10-CM | POA: Insufficient documentation

## 2020-05-02 DIAGNOSIS — C50912 Malignant neoplasm of unspecified site of left female breast: Secondary | ICD-10-CM

## 2020-05-02 DIAGNOSIS — Z170 Estrogen receptor positive status [ER+]: Secondary | ICD-10-CM | POA: Insufficient documentation

## 2020-05-02 DIAGNOSIS — Z8249 Family history of ischemic heart disease and other diseases of the circulatory system: Secondary | ICD-10-CM | POA: Insufficient documentation

## 2020-05-02 DIAGNOSIS — R110 Nausea: Secondary | ICD-10-CM | POA: Insufficient documentation

## 2020-05-02 DIAGNOSIS — Z808 Family history of malignant neoplasm of other organs or systems: Secondary | ICD-10-CM | POA: Insufficient documentation

## 2020-05-02 DIAGNOSIS — Z5111 Encounter for antineoplastic chemotherapy: Secondary | ICD-10-CM

## 2020-05-02 DIAGNOSIS — C50012 Malignant neoplasm of nipple and areola, left female breast: Secondary | ICD-10-CM | POA: Insufficient documentation

## 2020-05-02 DIAGNOSIS — M1990 Unspecified osteoarthritis, unspecified site: Secondary | ICD-10-CM | POA: Diagnosis not present

## 2020-05-02 DIAGNOSIS — C3492 Malignant neoplasm of unspecified part of left bronchus or lung: Secondary | ICD-10-CM

## 2020-05-02 DIAGNOSIS — Z8719 Personal history of other diseases of the digestive system: Secondary | ICD-10-CM | POA: Insufficient documentation

## 2020-05-02 LAB — CBC WITH DIFFERENTIAL/PLATELET
Abs Immature Granulocytes: 0.05 10*3/uL (ref 0.00–0.07)
Basophils Absolute: 0.1 10*3/uL (ref 0.0–0.1)
Basophils Relative: 1 %
Eosinophils Absolute: 0.4 10*3/uL (ref 0.0–0.5)
Eosinophils Relative: 5 %
HCT: 35.4 % — ABNORMAL LOW (ref 36.0–46.0)
Hemoglobin: 11.7 g/dL — ABNORMAL LOW (ref 12.0–15.0)
Immature Granulocytes: 1 %
Lymphocytes Relative: 22 %
Lymphs Abs: 1.6 10*3/uL (ref 0.7–4.0)
MCH: 32 pg (ref 26.0–34.0)
MCHC: 33.1 g/dL (ref 30.0–36.0)
MCV: 96.7 fL (ref 80.0–100.0)
Monocytes Absolute: 0.9 10*3/uL (ref 0.1–1.0)
Monocytes Relative: 12 %
Neutro Abs: 4.4 10*3/uL (ref 1.7–7.7)
Neutrophils Relative %: 59 %
Platelets: 272 10*3/uL (ref 150–400)
RBC: 3.66 MIL/uL — ABNORMAL LOW (ref 3.87–5.11)
RDW: 13.3 % (ref 11.5–15.5)
WBC: 7.4 10*3/uL (ref 4.0–10.5)
nRBC: 0 % (ref 0.0–0.2)

## 2020-05-02 LAB — COMPREHENSIVE METABOLIC PANEL
ALT: 27 U/L (ref 0–44)
AST: 30 U/L (ref 15–41)
Albumin: 4.2 g/dL (ref 3.5–5.0)
Alkaline Phosphatase: 70 U/L (ref 38–126)
Anion gap: 14 (ref 5–15)
BUN: 35 mg/dL — ABNORMAL HIGH (ref 8–23)
CO2: 21 mmol/L — ABNORMAL LOW (ref 22–32)
Calcium: 8.2 mg/dL — ABNORMAL LOW (ref 8.9–10.3)
Chloride: 100 mmol/L (ref 98–111)
Creatinine, Ser: 1.11 mg/dL — ABNORMAL HIGH (ref 0.44–1.00)
GFR, Estimated: 50 mL/min — ABNORMAL LOW (ref >60)
Glucose, Bld: 84 mg/dL (ref 70–99)
Potassium: 4.6 mmol/L (ref 3.5–5.1)
Sodium: 135 mmol/L (ref 135–145)
Total Bilirubin: 0.8 mg/dL (ref 0.3–1.2)
Total Protein: 6.9 g/dL (ref 6.5–8.1)

## 2020-05-02 NOTE — Progress Notes (Signed)
Hematology/Oncology follow up note [facility] [phone] [phone]   Patient Care Team: [name], MD as PCP - General (Family Medicine) Pyrtle, Lajuan Lines, MD as Consulting Physician (Gastroenterology) Telford Nab, RN as Registered Nurse Earlie Server, MD as Consulting Physician (Hematology and Oncology) Debbora Dus, [facility] as Pharmacist (Pharmacist) Burnice Logan, [facility] as Pharmacist (Pharmacist) Ralene Bathe, MD (Dermatology) Festus Aloe, MD as Consulting Physician (Urology)   REASON FOR VISIT:  Follow up for management of lung cancer and breast cancer  HISTORY OF PRESENTING ILLNESS:  Jamie Matthews is a  79 y.o.  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 05/05/2018 bilateral diagnostic breast mammogram showed suspicious mass 1.1cm  in the 12:00 retroareolar region of the left breast and the left axillary lymph node. 06/08/2018 patient status post a left breast retroareolar and left axillary lymph node biopsy. Pathology showed invasive mammary carcinoma, no special type, grade 1, left axillary lymph node positive for invasive mammary carcinoma clinically metastatic.  Background lymph node architecture is not identified. ER> 90% PR> 90%, HER-2 negative.  PET scan done which unfortunately showed additional hypermetabolic bilateral hilar lymph nodes as well as left upper lobe lung mass which may represent a focus of metastatic disease from breast, or primary lung neoplasm.  There is multiple additional small pulmonary nodules are scattered throughout both lungs which are worrisome for metastasis.  Index nodule within the medial right upper lobe measures 7 mm,, peri-broncho-vascular nodule in the right lower lobe measures 1.2 cm.  # Stage IV lung cancer-  Biopsy of lung mass left upper lobe showed non-small cell lung cancer, favor adenocarcinoma.  #NGS came back patient has PD L1  is 70% TPS, MET fusion mutation.  #Mid-March 2020 started on crizotinib  Bilateral lower extremity edema, right >left, Ultrasound venous right 09/20/2018 no DVT.  2D echo 10/10/2018 showed LVEF 55 to 60% Most likely secondary to crizotinib side effects.  # 12/15/2018 CT chest images were independently reviewed and discussed with patient. Dominant left upper lobe nodule and multiple scattered irregular pulmonary parenchymal nodular lesions are stable. Mild basilar pulmonary parenchymal septal thickening is new and can be seen with pulmonary edema. Patient reports no shortness of breath asymptomatic  #12/29/2018 unilateral left diagnostic mammogram with ultrasound images were independently reviewed and discussed with patient. Slight interval reduction of the size of the left retroareolar breast mass, 9 x 6 x 7 mm, comparing to 10 x 9 x 8 mm. Axillary lymph node measured 1.3 x 1.0 x 1.0 cm, previously 2.2 x 1.4 x 1.5 cm,  there were 2 other abdominal lymph nodes in the left axilla, which were not reported in previous ultrasound. Discussed with patient that overall, breast cancer responded to endocrine treatments.  Additional 2 lymph nodes need to be closely monitored.  Recommend patient to continue Arimidex.   INTERVAL HISTORY Jamie Matthews is a 79 y.o. female who has above history reviewed by me today presents for acute visit for nausea, and constipation.  Patient rescheduled recent follow-up appointments. She continues to have cough and feels congested.  Not improving after taking antibiotics. I ordered CT scan for further evaluation and the patient has called and rescheduled to 05/07/2020. Otherwise she has no new complaints  Review of Systems  Constitutional: Negative for appetite change, chills, fatigue and fever.  HENT:   Negative for hearing loss and voice change.        Congested  Eyes: Negative for eye problems.  Respiratory: Positive for cough. Negative for chest  tightness.   Cardiovascular: Positive for leg swelling. Negative for chest pain.  Gastrointestinal: Negative for abdominal distention, abdominal pain, blood in stool, constipation and nausea.  Endocrine: Negative for hot flashes.  Genitourinary: Negative for difficulty urinating and frequency.   Musculoskeletal: Negative for arthralgias.  Skin: Negative for itching and rash.  Neurological: Negative for extremity weakness and light-headedness.  Hematological: Negative for adenopathy.  Psychiatric/Behavioral: Negative for confusion and sleep disturbance.    MEDICAL HISTORY:  Past Medical History:  Diagnosis Date  . AKI (acute kidney injury) ([place]) 08/17/2018  . Anxiety   . Arthritis   . Belching   . Bladder disorder    OVERACTIVE  . Bowel dysfunction    BLOCKAGE  . Cancer (HCC)    breast  . Constipation   . Depression   . Diverticulitis   . Fibromyalgia   . GERD (gastroesophageal reflux disease)   . Hyperlipidemia   . IBS (irritable bowel syndrome)   . Internal hemorrhoids   . Lung cancer ([place]) 2020  . Memory deficits   . Murmur    asymptomatic  . Pneumonia 11/18/12  . Urinary incontinence   . Vertigo     SURGICAL HISTORY: Past Surgical History:  Procedure Laterality Date  . AXILLARY LYMPH NODE BIOPSY Left 06/08/2018   INVASIVE MAMMARY CARCINOMA  . BLADDER SUSPENSION  2004, 2012  . BREAST BIOPSY Left 06/08/2018   INVASIVE MAMMARY CARCINOMA  . CATARACT EXTRACTION W/PHACO Right 08/27/2015   Procedure: CATARACT EXTRACTION PHACO AND INTRAOCULAR LENS PLACEMENT (IOC);  Surgeon: Estill Cotta, MD;  Location: [facility] ORS;  Service: Ophthalmology;  Laterality: Right;  [place]   1:00.2 AP%  22.5 CDE  23.67 fluid casette lot #[id number]  exp05/31/2018  . CATARACT EXTRACTION W/PHACO Left 10/15/2015   Procedure: CATARACT EXTRACTION PHACO AND INTRAOCULAR LENS PLACEMENT (IOC);  Surgeon: Estill Cotta, MD;  Location: [facility] ORS;  Service: Ophthalmology;  Laterality: Left;  [place] 01:07 AP%  18.1 CDE 21.57 fluid pack lot # [id number]  . CHOLECYSTECTOMY    . COLONOSCOPY  2017  . ELECTROMAGNETIC NAVIGATION BROCHOSCOPY N/A 07/09/2018   Procedure: ELECTROMAGNETIC NAVIGATION BRONCHOSCOPY;  Surgeon: Flora Lipps, MD;  Location: [facility] ORS;  Service: Cardiopulmonary;  Laterality: N/A;  . TONSILLECTOMY  1947    SOCIAL HISTORY: Social History   Socioeconomic History  . Marital status: Married    Spouse name: Bennye Alm  . Number of children: 0  . Years of education: Master's Degree  . Highest education level: Not on file  Occupational History  . Occupation: Retired  Tobacco Use  . Smoking status: Never Smoker  . Smokeless tobacco: Never Used  Vaping Use  . Vaping Use: Never used  Substance and Sexual Activity  . Alcohol use: Not Currently    Comment: rare wine  . Drug use: No  . Sexual activity: Not Currently  Other Topics Concern  . Not on file  Social History Narrative   Recently moved with her husband to [place] from [place].   Husband is a retired Pharmacist, community.   No children.   She is a retired Pharmacist, hospital.   Enjoys: Chief Technology Officer, reading - mysteries and biographies, cooking   Exercise: walking, gardening   Diet: low appetite due to cancer treatment, grazing   She is a DNR.   Social Determinants of Health   Financial Resource Strain: Low Risk   . Difficulty of Paying Living Expenses: Not hard at all  Food  Insecurity: No Food Insecurity  . Worried About Charity fundraiser in the Last Year: Never true  . Ran Out of Food in the Last Year: Never true  Transportation Needs: No Transportation Needs  . Lack of Transportation (Medical): No  . Lack of Transportation (Non-Medical): No  Physical Activity: Inactive  . Days of Exercise per Week: 0 days  . Minutes of Exercise per Session: 0 min  Stress: No Stress Concern Present  . Feeling of Stress : Not at all  Social Connections:   . Frequency of Communication with Friends and Family: Not on file  . Frequency of Social  Gatherings with Friends and Family: Not on file  . Attends Religious Services: Not on file  . Active Member of Clubs or Organizations: Not on file  . Attends Archivist Meetings: Not on file  . Marital Status: Not on file  Intimate Partner Violence: Not At Risk  . Fear of Current or Ex-Partner: No  . Emotionally Abused: No  . Physically Abused: No  . Sexually Abused: No    FAMILY HISTORY: Family History  Problem Relation Age of Onset  . Diabetes Father   . Heart disease Father   . Lymphoma Father   . Heart disease Mother   . Breast cancer Sister 70  . Colon cancer Neg Hx   . Esophageal cancer Neg Hx   . Rectal cancer Neg Hx   . Stomach cancer Neg Hx   . Bladder Cancer Neg Hx   . Kidney cancer Neg Hx     ALLERGIES:  is allergic to sulfa antibiotics.  MEDICATIONS:  Current Outpatient Medications  Medication Sig Dispense Refill  . acetaminophen (TYLENOL) 325 MG tablet Take 325 mg by mouth every 6 (six) hours as needed for mild pain.     [name] anastrozole (ARIMIDEX) 1 MG tablet TAKE 1 TABLET BY MOUTH DAILY 30 tablet 3  . atorvastatin (LIPITOR) 20 MG tablet TAKE 1 TABLET BY MOUTH NIGHTLY 90 tablet 0  . benzonatate (TESSALON PERLES) 100 MG capsule Take 1 capsule (100 mg total) by mouth 3 (three) times daily as needed for cough. 30 capsule 0  . buPROPion (WELLBUTRIN XL) 150 MG 24 hr tablet TAKE ONE TABLET BY MOUTH EVERY DAY (Patient taking differently: Take 150 mg by mouth daily. ) 90 tablet 2  . Calcium-Magnesium-Vitamin D (CALCIUM 1200+D3 PO) Take 1 tablet by mouth daily.    [name] Dextromethorphan-guaiFENesin (MUCINEX DM) 30-600 MG TB12 Take 1 tablet by mouth 2 (two) times daily as needed (for congestion/cough).     . docusate sodium (COLACE) 100 MG capsule Take 1 capsule (100 mg total) by mouth daily. (Patient taking differently: Take 100 mg by mouth daily as needed. ) 90 capsule 0  . levothyroxine (SYNTHROID) 25 MCG tablet TAKE 1 TABLET BY MOUTH DAILY 90 tablet 3  .  lubiprostone (AMITIZA) 24 MCG capsule TAKE ONE CAPSULE TWICE A DAY WITH MEALS (Patient taking differently: Take 24 mcg by mouth 2 (two) times daily with a meal. TAKE ONE CAPSULE TWICE A DAY WITH MEALS) 60 capsule 3  . memantine (NAMENDA) 10 MG tablet TAKE ONE TABLET BY MOUTH TWICE DAILY (Patient taking differently: Take 10 mg by mouth 2 (two) times daily. ) 180 tablet 2  . mirabegron ER (MYRBETRIQ) 50 MG TB24 tablet Take 1 tablet (50 mg total) by mouth daily. 90 tablet 1  . Multiple Vitamin (MULTIVITAMIN WITH MINERALS) TABS tablet Take 1 tablet by mouth daily.    [name] omeprazole (  PRILOSEC) 40 MG capsule Take 40 mg by mouth daily.     . ondansetron (ZOFRAN) 8 MG tablet TAKE ONE TABLET EVERY EIGHT HOURS AS NEEDED FOR NAUSEA / VOMITING 60 tablet 1  . polyethylene glycol (MIRALAX / GLYCOLAX) packet Take 17 g by mouth daily as needed.     . sucralfate (CARAFATE) 1 g tablet Take 1 tablet (1 g total) by mouth 2 (two) times daily. 60 tablet 0  . venlafaxine XR (EFFEXOR-XR) 150 MG 24 hr capsule TAKE 1 CAPSULE BY MOUTH EVERY DAY 90 capsule 1  . Vitamin D, Ergocalciferol, (DRISDOL) 1.25 MG (50000 UNIT) CAPS capsule Take 50,000 Units by mouth once a week.    [name] XALKORI 250 MG capsule TAKE 1 CAPSULE (250 MG TOTAL) BY MOUTH 2 TIMES DAILY. 60 capsule 2  . desonide (DESOWEN) 0.05 % lotion Apply 1 application topically as needed (after showering).  (Patient not taking: Reported on 05/02/2020)    . fluticasone (FLONASE) 50 MCG/ACT nasal spray Place 2 sprays into both nostrils daily. (Patient not taking: Reported on 05/02/2020) 16 g 6  . ketoconazole (NIZORAL) 2 % shampoo Apply 1 application topically as needed. 3 times a week (Patient not taking: Reported on 05/02/2020)     No current facility-administered medications for this visit.     PHYSICAL EXAMINATION: ECOG PERFORMANCE STATUS: 2 - Symptomatic, <50% confined to bed Vitals:   05/02/20 1409  BP: (!) 151/83  Pulse: 65  Resp: 18  Temp: 97.7 F (36.5 C)   Filed  Weights   05/02/20 1409  Weight: 153 lb 11.2 oz (69.7 kg)    Physical Exam Constitutional:      General: She is not in acute distress.    Appearance: She is not diaphoretic.     Comments: Frail female, walks with a walker  HENT:     Head: Normocephalic and atraumatic.     Nose: Nose normal.     Mouth/Throat:     Pharynx: No oropharyngeal exudate.  Eyes:     General: No scleral icterus.    Pupils: Pupils are equal, round, and reactive to light.  Cardiovascular:     Rate and Rhythm: Normal rate and regular rhythm.     Heart sounds: No murmur heard.   Pulmonary:     Effort: Pulmonary effort is normal. No respiratory distress.     Breath sounds: No wheezing or rales.  Chest:     Chest wall: No tenderness.  Abdominal:     General: There is no distension.     Palpations: Abdomen is soft.     Tenderness: There is no abdominal tenderness.  Musculoskeletal:        General: Normal range of motion.     Cervical back: Normal range of motion and neck supple.     Comments: Bilateral lower extremity 2+ edema  Skin:    General: Skin is warm and dry.     Findings: No erythema.  Neurological:     Mental Status: She is alert and oriented to person, place, and time.     Cranial Nerves: No cranial nerve deficit.     Motor: No abnormal muscle tone.     Coordination: Coordination normal.  Psychiatric:        Mood and Affect: Affect normal.      CMP Latest Ref Rng & Units 05/02/2020  Glucose 70 - 99 mg/dL 84  BUN 8 - 23 mg/dL 35(H)  Creatinine 0.44 - 1.00 mg/dL 1.11(H)  Sodium 135 -  145 mmol/L 135  Potassium 3.5 - 5.1 mmol/L 4.6  Chloride 98 - 111 mmol/L 100  CO2 22 - 32 mmol/L 21(L)  Calcium 8.9 - 10.3 mg/dL 8.2(L)  Total Protein 6.5 - 8.1 g/dL 6.9  Total Bilirubin 0.3 - 1.2 mg/dL 0.8  Alkaline Phos 38 - 126 U/L 70  AST 15 - 41 U/L 30  ALT 0 - 44 U/L 27   CBC Latest Ref Rng & Units 05/02/2020  WBC 4.0 - 10.5 K/uL 7.4  Hemoglobin 12.0 - 15.0 g/dL 11.7(L)  Hematocrit 36 - 46 %  35.4(L)  Platelets 150 - 400 K/uL 272    LABORATORY DATA:  I have reviewed the data as listed Lab Results  Component Value Date   WBC 7.4 05/02/2020   HGB 11.7 (L) 05/02/2020   HCT 35.4 (L) 05/02/2020   MCV 96.7 05/02/2020   PLT 272 05/02/2020   Recent Labs    12/01/19 1316 12/05/19 1200 01/02/20 0832 01/02/20 0832 01/31/20 0917 04/04/20 1243 05/02/20 1337  NA 134*   < > 142   < > 140 140 135  K 4.2   < > 5.0   < > 4.1 4.5 4.6  CL 100   < > 105   < > 107 103 100  CO2 27   < > 29   < > 26 29 21*  GLUCOSE 109*   < > 132*   < > 102* 100* 84  BUN 33*   < > 19   < > 23 23 35*  CREATININE 1.23*   < > 0.95   < > 1.13* 1.08* 1.11*  CALCIUM 7.2*   < > 8.7*   < > 7.5* 8.8* 8.2*  GFRNONAA 41*  --  56*   < > 46* 52* 50*  GFRAA 48*  --  >60  --  53*  --   --   PROT  --   --  6.5   < > 5.8* 6.7 6.9  ALBUMIN  --   --  3.5   < > 3.3* 3.6 4.2  AST  --   --  25   < > _0 ALT  --   --  24   < > _1 ALKPHOS  --   --  92   < > 82 68 70  BILITOT  --   --  0.7   < > 0.7 0.6 0.8   < > = values in this interval not displayed.   Iron/TIBC/Ferritin/ %Sat No results found for: IRON, TIBC, FERRITIN, IRONPCTSAT   RADIOGRAPHIC STUDIES: I have personally reviewed the radiological images as listed and agreed with the findings in the report. DG Chest 2 View  Result Date: 04/05/2020 CLINICAL DATA:  Cough EXAM: CHEST - 2 VIEW COMPARISON:  07/15/2019 FINDINGS: Cardiac and mediastinal contours normal. Atherosclerotic calcification aortic arch. Pulmonary vascularity normal. Lungs are clear without infiltrate or effusion Multiple chronic right rib fractures present. Calcified granulomata left upper lobe unchanged. IMPRESSION: No active cardiopulmonary disease. Electronically Signed   By: Franchot Gallo M.D.   On: 04/05/2020 09:14      ASSESSMENT & PLAN:  1. Malignant neoplasm of left female breast, unspecified estrogen receptor status, unspecified site of breast ([place])   2. Primary  adenocarcinoma of lung, unspecified laterality ([place])   3. Hypocalcemia   4. Cough   Cancer Staging Malignant neoplasm of left female breast [facility]) Staging form: Breast, AJCC 8th Edition - Clinical stage  from 12/17/2018: Stage IB (cT1b, cN1, cM0, G1, ER+, PR+, HER2-) - Signed by Earlie Server, MD on 12/17/2018  Primary adenocarcinoma of lung [facility]) Staging form: Lung, AJCC 8th Edition - Clinical stage from 09/28/2018: Stage IVA (cT2, cN3, cM1a) - Signed by Earlie Server, MD on 09/28/2018   Patient has 2 primaries.   Stage IV lung adenocarcinoma, met fusion mutation cT2 N3 M1a PDL 1 TPS more than 70%  Breast Cancer,  For now continue crizotinib and aromatase inhibitor. Check CT chest for evaluation of her cough, congestion and evaluation of disease status. I will check MRI brain for follow-up of disease status.  #hypocalcemia, Corrected calcium is 8.2 today improved.  Recommend her to continue calcium supplementation and vitamin D supplementation. I will hold Xgeva today.  Follow-up 1 month or sooner if CT shows progression. All questions were answered. The patient knows to call the clinic with any problems questions or concerns.  Earlie Server, MD, PhD 05/02/2020

## 2020-05-02 NOTE — Progress Notes (Signed)
Patient here for follow up. Pt reports increased tiredness and reflux.

## 2020-05-07 ENCOUNTER — Telehealth: Payer: Self-pay

## 2020-05-07 ENCOUNTER — Ambulatory Visit
Admission: RE | Admit: 2020-05-07 | Discharge: 2020-05-07 | Disposition: A | Discharge status: Home / Self Care | Payer: Medicare Other | Source: Ambulatory Visit | Attending: Oncology | Admitting: Oncology

## 2020-05-07 ENCOUNTER — Other Ambulatory Visit: Payer: Self-pay

## 2020-05-07 ENCOUNTER — Ambulatory Visit: Admission: RE | Admit: 2020-05-07 | Payer: Medicare Other | Source: Ambulatory Visit | Admitting: *Deleted

## 2020-05-07 DIAGNOSIS — C3490 Malignant neoplasm of unspecified part of unspecified bronchus or lung: Secondary | ICD-10-CM | POA: Insufficient documentation

## 2020-05-07 DIAGNOSIS — C50912 Malignant neoplasm of unspecified site of left female breast: Secondary | ICD-10-CM

## 2020-05-07 DIAGNOSIS — S22070A Wedge compression fracture of T9-T10 vertebra, initial encounter for closed fracture: Secondary | ICD-10-CM | POA: Diagnosis not present

## 2020-05-07 DIAGNOSIS — C3492 Malignant neoplasm of unspecified part of left bronchus or lung: Secondary | ICD-10-CM | POA: Diagnosis not present

## 2020-05-07 DIAGNOSIS — Z5111 Encounter for antineoplastic chemotherapy: Secondary | ICD-10-CM | POA: Diagnosis not present

## 2020-05-07 NOTE — Chronic Care Management (AMB) (Signed)
Chronic Care Management Pharmacy Assistant   Name: Jamie Matthews  MRN: [id number] DOB: 1941-04-02  Reason for Encounter: Disease State  Patient Questions:  1.  Have you seen any other providers since your last visit? Yes 05/02/20- Earlie Server, MD- Oncology- Lung and breast cancer 04/23/20- Waunita Schooner, MD- PCP-  04/04/20- Earlie Server, MD- oncology-  04/03/20- Waunita Schooner, MD- PCP video visit 01/31/20- Earlie Server, MD - Oncology-    2.  Any changes in your medicines or health? Yes 04/23/20- Dr. Einar Pheasant put on course of Amoxicillin-Pot Clavulanate 875-125 mg, 04/03/20 Dr. Einar Pheasant started her on a course of azithromycin 250 mg and benzonatate for a cough. 01/31/20 Dr. Tasia Catchings started colace 100 mg daily.     PCP : [name], MD  Allergies:   Allergies  Allergen Reactions  . Sulfa Antibiotics Rash    Medications: Outpatient Encounter Medications as of 05/07/2020  Medication Sig  . acetaminophen (TYLENOL) 325 MG tablet Take 325 mg by mouth every 6 (six) hours as needed for mild pain.   [name] anastrozole (ARIMIDEX) 1 MG tablet TAKE 1 TABLET BY MOUTH DAILY  . atorvastatin (LIPITOR) 20 MG tablet TAKE 1 TABLET BY MOUTH NIGHTLY  . benzonatate (TESSALON PERLES) 100 MG capsule Take 1 capsule (100 mg total) by mouth 3 (three) times daily as needed for cough.  [name] buPROPion (WELLBUTRIN XL) 150 MG 24 hr tablet TAKE ONE TABLET BY MOUTH EVERY DAY (Patient taking differently: Take 150 mg by mouth daily. )  . Calcium-Magnesium-Vitamin D (CALCIUM 1200+D3 PO) Take 1 tablet by mouth daily.  [name] desonide (DESOWEN) 0.05 % lotion Apply 1 application topically as needed (after showering).  (Patient not taking: Reported on 05/02/2020)  . Dextromethorphan-guaiFENesin (MUCINEX DM) 30-600 MG TB12 Take 1 tablet by mouth 2 (two) times daily as needed (for congestion/cough).   . docusate sodium (COLACE) 100 MG capsule Take 1 capsule (100 mg total) by mouth daily. (Patient taking differently: Take 100 mg by mouth daily as  needed. )  . fluticasone (FLONASE) 50 MCG/ACT nasal spray Place 2 sprays into both nostrils daily. (Patient not taking: Reported on 05/02/2020)  . ketoconazole (NIZORAL) 2 % shampoo Apply 1 application topically as needed. 3 times a week (Patient not taking: Reported on 05/02/2020)  . levothyroxine (SYNTHROID) 25 MCG tablet TAKE 1 TABLET BY MOUTH DAILY  . lubiprostone (AMITIZA) 24 MCG capsule TAKE ONE CAPSULE TWICE A DAY WITH MEALS (Patient taking differently: Take 24 mcg by mouth 2 (two) times daily with a meal. TAKE ONE CAPSULE TWICE A DAY WITH MEALS)  . memantine (NAMENDA) 10 MG tablet TAKE ONE TABLET BY MOUTH TWICE DAILY (Patient taking differently: Take 10 mg by mouth 2 (two) times daily. )  . mirabegron ER (MYRBETRIQ) 50 MG TB24 tablet Take 1 tablet (50 mg total) by mouth daily.  . Multiple Vitamin (MULTIVITAMIN WITH MINERALS) TABS tablet Take 1 tablet by mouth daily.  [name] omeprazole (PRILOSEC) 40 MG capsule Take 40 mg by mouth daily.   . ondansetron (ZOFRAN) 8 MG tablet TAKE ONE TABLET EVERY EIGHT HOURS AS NEEDED FOR NAUSEA / VOMITING  . polyethylene glycol (MIRALAX / GLYCOLAX) packet Take 17 g by mouth daily as needed.   . sucralfate (CARAFATE) 1 g tablet Take 1 tablet (1 g total) by mouth 2 (two) times daily.  [name] venlafaxine XR (EFFEXOR-XR) 150 MG 24 hr capsule TAKE 1 CAPSULE BY MOUTH EVERY DAY  . Vitamin D, Ergocalciferol, (DRISDOL) 1.25 MG (50000 UNIT) CAPS capsule  Take 50,000 Units by mouth once a week.  [name] XALKORI 250 MG capsule TAKE 1 CAPSULE (250 MG TOTAL) BY MOUTH 2 TIMES DAILY.   No facility-administered encounter medications on file as of 05/07/2020.    Current Diagnosis: Patient Active Problem List   Diagnosis Date Noted  . Aortic valve stenosis 01/19/2020  . Left hip pain 12/05/2019  . Hypocalcemia 11/02/2019  . Trochanteric bursitis 09/20/2019  . Pruritus 09/06/2019  . Overactive bladder 09/06/2019  . Chronic pain of right knee 09/06/2019  . Multiple rib fractures  07/15/2019  . Fall as cause of accidental injury at home as place of occurrence 07/15/2019  . Encounter for antineoplastic chemotherapy 12/17/2018  . Lower leg edema 12/17/2018  . Aromatase inhibitor use 10/24/2018  . Osteopenia 10/24/2018  . Fever 10/10/2018  . Closed displaced fracture of pubis ([place]) 09/20/2018  . Localized swelling of right lower leg 09/20/2018  . AKI (acute kidney injury) ([place]) 08/17/2018  . Myofascial pain 07/28/2018  . Primary adenocarcinoma of lung ([place]) 07/21/2018  . Malignant neoplasm of left female breast ([place]) 07/21/2018  . Adenopathy   . Goals of care, counseling/discussion 06/26/2018  . IBS (irritable bowel syndrome) 05/25/2018  . BPV (benign positional vertigo) 07/30/2017  . Unilateral primary osteoarthritis, left knee 06/24/2017  . Bunion of great toe of right foot 06/09/2017  . Dizziness 03/24/2017  . Decreased appetite 12/03/2016  . Fatigue 09/22/2016  . Insomnia 09/22/2016  . Depression, recurrent ([place]) 09/22/2016  . Chronic cough 08/21/2016  . Primary localized osteoarthrosis, hand 03/19/2016  . OA (osteoarthritis) of knee 03/17/2016  . Stress due to illness of family member 10/10/2014  . Ileitis 06/22/2014  . Chronic insomnia 09/20/2013  . DNR (do not resuscitate) 11/15/2012  . Other constipation 04/01/2012  . 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 Urinary incontinence   . Major depressive disorder, recurrent, moderate   . Gastroesophageal reflux disease   . Hyperlipidemia     Ms. Bernadene Person was contacted for follow up of hypercholesterolemia and hypothyroidism and adherence to levothyroxine. Patient was contacted on 3 different occasions but did not return phone calls.     Follow-Up:  Pharmacist Review   Debbora Dus, CPP notified   Margaretmary Dys, [facility]acy Assistant [phone]

## 2020-05-09 ENCOUNTER — Ambulatory Visit: Payer: Medicare Other

## 2020-05-10 ENCOUNTER — Ambulatory Visit: Payer: Medicare Other | Admitting: Family Medicine

## 2020-05-10 DIAGNOSIS — Z0289 Encounter for other administrative examinations: Secondary | ICD-10-CM

## 2020-05-17 ENCOUNTER — Other Ambulatory Visit: Payer: Self-pay | Admitting: Oncology

## 2020-05-17 DIAGNOSIS — C3490 Malignant neoplasm of unspecified part of unspecified bronchus or lung: Secondary | ICD-10-CM

## 2020-05-21 MED FILL — XALKORI 250 MG CAPSULE: 250 | 30 days supply | Qty: 60 | Fill #0

## 2020-05-22 ENCOUNTER — Ambulatory Visit
Admission: RE | Admit: 2020-05-22 | Discharge: 2020-05-22 | Disposition: A | Discharge status: Home / Self Care | Payer: Medicare Other | Source: Ambulatory Visit | Attending: Oncology | Admitting: Oncology

## 2020-05-22 ENCOUNTER — Other Ambulatory Visit: Payer: Self-pay

## 2020-05-22 DIAGNOSIS — I6782 Cerebral ischemia: Secondary | ICD-10-CM | POA: Diagnosis not present

## 2020-05-22 DIAGNOSIS — C50912 Malignant neoplasm of unspecified site of left female breast: Secondary | ICD-10-CM | POA: Diagnosis not present

## 2020-05-22 DIAGNOSIS — Z85118 Personal history of other malignant neoplasm of bronchus and lung: Secondary | ICD-10-CM | POA: Diagnosis not present

## 2020-05-22 DIAGNOSIS — C3490 Malignant neoplasm of unspecified part of unspecified bronchus or lung: Secondary | ICD-10-CM | POA: Insufficient documentation

## 2020-05-22 DIAGNOSIS — G319 Degenerative disease of nervous system, unspecified: Secondary | ICD-10-CM | POA: Diagnosis not present

## 2020-05-22 DIAGNOSIS — Z853 Personal history of malignant neoplasm of breast: Secondary | ICD-10-CM | POA: Diagnosis not present

## 2020-05-22 MED ORDER — GADOBUTROL 1 MMOL/ML IV SOLN
7.0000 mL | Freq: Once | INTRAVENOUS | Status: AC | PRN
  Administered 2020-05-22: 14:00:00 7 mL via INTRAVENOUS

## 2020-05-30 ENCOUNTER — Inpatient Hospital Stay: Payer: Medicare Other

## 2020-05-30 ENCOUNTER — Telehealth: Payer: Self-pay | Admitting: Oncology

## 2020-05-30 ENCOUNTER — Inpatient Hospital Stay: Payer: Medicare Other | Admitting: Oncology

## 2020-05-30 NOTE — Telephone Encounter (Signed)
Ellison Hughs is not here but looks like [facility] rescheduled her already. Thanks.

## 2020-05-30 NOTE — Telephone Encounter (Signed)
Pt called to cancel/reschedule appointment due to vomiting. Pt requested call back to reschedule [phone]

## 2020-06-05 ENCOUNTER — Inpatient Hospital Stay: Payer: Medicare Other

## 2020-06-05 ENCOUNTER — Inpatient Hospital Stay: Payer: Medicare Other | Admitting: Oncology

## 2020-06-05 ENCOUNTER — Telehealth: Payer: Self-pay | Admitting: Oncology

## 2020-06-05 NOTE — Telephone Encounter (Signed)
Fleet Contras called on behalf of the patient and left a vm requesting to reschedule appointments for 06/05/20 starting at 130pm.  Routing to scheduler for follow up.

## 2020-06-07 ENCOUNTER — Telehealth: Payer: Self-pay | Admitting: Oncology

## 2020-06-07 NOTE — Telephone Encounter (Signed)
06/07/2020 Employee from [facility] called to r/s appts missed on 06/05/20. New appts made for 06/25/20. Facility confirmed date and time and will arrange transportation [facility]

## 2020-06-25 ENCOUNTER — Other Ambulatory Visit: Payer: Self-pay

## 2020-06-25 ENCOUNTER — Inpatient Hospital Stay: Payer: Medicare Other

## 2020-06-25 ENCOUNTER — Encounter: Payer: Self-pay | Admitting: Oncology

## 2020-06-25 ENCOUNTER — Inpatient Hospital Stay: Payer: Medicare Other | Attending: Oncology | Admitting: Oncology

## 2020-06-25 VITALS — BP 114/51 | HR 55 | Temp 97.9°F | Resp 16 | Wt 150.1 lb

## 2020-06-25 DIAGNOSIS — C773 Secondary and unspecified malignant neoplasm of axilla and upper limb lymph nodes: Secondary | ICD-10-CM | POA: Diagnosis not present

## 2020-06-25 DIAGNOSIS — E785 Hyperlipidemia, unspecified: Secondary | ICD-10-CM | POA: Insufficient documentation

## 2020-06-25 DIAGNOSIS — C3490 Malignant neoplasm of unspecified part of unspecified bronchus or lung: Secondary | ICD-10-CM | POA: Diagnosis not present

## 2020-06-25 DIAGNOSIS — Z803 Family history of malignant neoplasm of breast: Secondary | ICD-10-CM | POA: Diagnosis not present

## 2020-06-25 DIAGNOSIS — Z8719 Personal history of other diseases of the digestive system: Secondary | ICD-10-CM | POA: Diagnosis not present

## 2020-06-25 DIAGNOSIS — R600 Localized edema: Secondary | ICD-10-CM | POA: Insufficient documentation

## 2020-06-25 DIAGNOSIS — Z808 Family history of malignant neoplasm of other organs or systems: Secondary | ICD-10-CM | POA: Insufficient documentation

## 2020-06-25 DIAGNOSIS — M7989 Other specified soft tissue disorders: Secondary | ICD-10-CM | POA: Diagnosis not present

## 2020-06-25 DIAGNOSIS — Z833 Family history of diabetes mellitus: Secondary | ICD-10-CM | POA: Insufficient documentation

## 2020-06-25 DIAGNOSIS — Z79811 Long term (current) use of aromatase inhibitors: Secondary | ICD-10-CM | POA: Diagnosis not present

## 2020-06-25 DIAGNOSIS — C50112 Malignant neoplasm of central portion of left female breast: Secondary | ICD-10-CM | POA: Diagnosis not present

## 2020-06-25 DIAGNOSIS — C3412 Malignant neoplasm of upper lobe, left bronchus or lung: Secondary | ICD-10-CM | POA: Insufficient documentation

## 2020-06-25 DIAGNOSIS — C3492 Malignant neoplasm of unspecified part of left bronchus or lung: Secondary | ICD-10-CM

## 2020-06-25 DIAGNOSIS — Z9049 Acquired absence of other specified parts of digestive tract: Secondary | ICD-10-CM | POA: Diagnosis not present

## 2020-06-25 DIAGNOSIS — N6315 Unspecified lump in the right breast, overlapping quadrants: Secondary | ICD-10-CM | POA: Insufficient documentation

## 2020-06-25 DIAGNOSIS — Z5111 Encounter for antineoplastic chemotherapy: Secondary | ICD-10-CM

## 2020-06-25 DIAGNOSIS — C50912 Malignant neoplasm of unspecified site of left female breast: Secondary | ICD-10-CM | POA: Diagnosis not present

## 2020-06-25 DIAGNOSIS — Z79899 Other long term (current) drug therapy: Secondary | ICD-10-CM | POA: Insufficient documentation

## 2020-06-25 DIAGNOSIS — M47814 Spondylosis without myelopathy or radiculopathy, thoracic region: Secondary | ICD-10-CM | POA: Diagnosis not present

## 2020-06-25 DIAGNOSIS — M8580 Other specified disorders of bone density and structure, unspecified site: Secondary | ICD-10-CM

## 2020-06-25 DIAGNOSIS — N6325 Unspecified lump in the left breast, overlapping quadrants: Secondary | ICD-10-CM | POA: Diagnosis not present

## 2020-06-25 DIAGNOSIS — Z8249 Family history of ischemic heart disease and other diseases of the circulatory system: Secondary | ICD-10-CM | POA: Diagnosis not present

## 2020-06-25 DIAGNOSIS — Z882 Allergy status to sulfonamides status: Secondary | ICD-10-CM | POA: Diagnosis not present

## 2020-06-25 DIAGNOSIS — Z170 Estrogen receptor positive status [ER+]: Secondary | ICD-10-CM | POA: Diagnosis not present

## 2020-06-25 LAB — CBC WITH DIFFERENTIAL/PLATELET
Abs Immature Granulocytes: 0.03 10*3/uL (ref 0.00–0.07)
Basophils Absolute: 0 10*3/uL (ref 0.0–0.1)
Basophils Relative: 1 %
Eosinophils Absolute: 0.4 10*3/uL (ref 0.0–0.5)
Eosinophils Relative: 6 %
HCT: 36.7 % (ref 36.0–46.0)
Hemoglobin: 12 g/dL (ref 12.0–15.0)
Immature Granulocytes: 1 %
Lymphocytes Relative: 18 %
Lymphs Abs: 1.1 10*3/uL (ref 0.7–4.0)
MCH: 32.1 pg (ref 26.0–34.0)
MCHC: 32.7 g/dL (ref 30.0–36.0)
MCV: 98.1 fL (ref 80.0–100.0)
Monocytes Absolute: 0.8 10*3/uL (ref 0.1–1.0)
Monocytes Relative: 13 %
Neutro Abs: 3.9 10*3/uL (ref 1.7–7.7)
Neutrophils Relative %: 61 %
Platelets: 264 10*3/uL (ref 150–400)
RBC: 3.74 MIL/uL — ABNORMAL LOW (ref 3.87–5.11)
RDW: 12.9 % (ref 11.5–15.5)
WBC: 6.2 10*3/uL (ref 4.0–10.5)
nRBC: 0 % (ref 0.0–0.2)

## 2020-06-25 LAB — COMPREHENSIVE METABOLIC PANEL
ALT: 14 U/L (ref 0–44)
AST: 18 U/L (ref 15–41)
Albumin: 3.4 g/dL — ABNORMAL LOW (ref 3.5–5.0)
Alkaline Phosphatase: 73 U/L (ref 38–126)
Anion gap: 6 (ref 5–15)
BUN: 22 mg/dL (ref 8–23)
CO2: 28 mmol/L (ref 22–32)
Calcium: 8.2 mg/dL — ABNORMAL LOW (ref 8.9–10.3)
Chloride: 101 mmol/L (ref 98–111)
Creatinine, Ser: 0.97 mg/dL (ref 0.44–1.00)
GFR, Estimated: 59 mL/min — ABNORMAL LOW (ref >60)
Glucose, Bld: 153 mg/dL — ABNORMAL HIGH (ref 70–99)
Potassium: 4.8 mmol/L (ref 3.5–5.1)
Sodium: 135 mmol/L (ref 135–145)
Total Bilirubin: 0.5 mg/dL (ref 0.3–1.2)
Total Protein: 6.2 g/dL — ABNORMAL LOW (ref 6.5–8.1)

## 2020-06-25 MED ORDER — ANASTROZOLE 1 MG PO TABS: 1.0000 mg | ORAL_TABLET | Freq: Every day | ORAL | 1 refills | Status: DC

## 2020-06-25 NOTE — Progress Notes (Signed)
Patient denies new problems/concerns today.   °

## 2020-06-25 NOTE — Progress Notes (Signed)
Hematology/Oncology follow up note [facility] Telephone:([phone] [phone]   Patient Care Team: [name], MD as PCP - General (Family Medicine) Pyrtle, Lajuan Lines, MD as Consulting Physician (Gastroenterology) Telford Nab, RN as Registered Nurse Earlie Server, MD as Consulting Physician (Hematology and Oncology) Debbora Dus, [facility] as Pharmacist (Pharmacist) Burnice Logan, [facility] as Pharmacist (Pharmacist) Ralene Bathe, MD (Dermatology) Festus Aloe, MD as Consulting Physician (Urology)   REASON FOR VISIT:  Follow up for management of lung cancer and breast cancer  HISTORY OF PRESENTING ILLNESS:  Jamie Matthews is a  80 y.o.  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 05/05/2018 bilateral diagnostic breast mammogram showed suspicious mass 1.1cm  in the 12:00 retroareolar region of the left breast and the left axillary lymph node. 06/08/2018 patient status post a left breast retroareolar and left axillary lymph node biopsy. Pathology showed invasive mammary carcinoma, no special type, grade 1, left axillary lymph node positive for invasive mammary carcinoma clinically metastatic.  Background lymph node architecture is not identified. ER> 90% PR> 90%, HER-2 negative.  PET scan done which unfortunately showed additional hypermetabolic bilateral hilar lymph nodes as well as left upper lobe lung mass which may represent a focus of metastatic disease from breast, or primary lung neoplasm.  There is multiple additional small pulmonary nodules are scattered throughout both lungs which are worrisome for metastasis.  Index nodule within the medial right upper lobe measures 7 mm,, peri-broncho-vascular nodule in the right lower lobe measures 1.2 cm.  # Stage IV lung cancer-  Biopsy of lung mass left upper lobe showed non-small cell lung cancer, favor adenocarcinoma.  #NGS came back patient has PD L1  is 70% TPS, MET fusion mutation.  #Mid-March 2020 started on crizotinib  Bilateral lower extremity edema, right >left, Ultrasound venous right 09/20/2018 no DVT.  2D echo 10/10/2018 showed LVEF 55 to 60% Most likely secondary to crizotinib side effects.  # 12/15/2018 CT chest images were independently reviewed and discussed with patient. Dominant left upper lobe nodule and multiple scattered irregular pulmonary parenchymal nodular lesions are stable. Mild basilar pulmonary parenchymal septal thickening is new and can be seen with pulmonary edema. Patient reports no shortness of breath asymptomatic  #12/29/2018 unilateral left diagnostic mammogram with ultrasound images were independently reviewed and discussed with patient. Slight interval reduction of the size of the left retroareolar breast mass, 9 x 6 x 7 mm, comparing to 10 x 9 x 8 mm. Axillary lymph node measured 1.3 x 1.0 x 1.0 cm, previously 2.2 x 1.4 x 1.5 cm,  there were 2 other abdominal lymph nodes in the left axilla, which were not reported in previous ultrasound. Discussed with patient that overall, breast cancer responded to endocrine treatments.  Additional 2 lymph nodes need to be closely monitored.  Recommend patient to continue Arimidex.   INTERVAL HISTORY [name] is a 80 y.o. female who has above history reviewed by me today presents fornon-small cell lung cancer and breast cancer management. Patient has no new complaints. She is not sure if she is still taking Arimidex.  She takes crizotinib. Chronic lower extremity edema, unchanged. Forgetful   Review of Systems  Constitutional: Negative for appetite change, chills, fatigue and fever.  HENT:   Negative for hearing loss and voice change.   Eyes: Negative for eye problems.  Respiratory: Negative for chest tightness and cough.   Cardiovascular: Positive for leg swelling. Negative  for chest pain.  Gastrointestinal: Negative for abdominal distention,  abdominal pain, blood in stool, constipation and nausea.  Endocrine: Negative for hot flashes.  Genitourinary: Negative for difficulty urinating and frequency.   Musculoskeletal: Negative for arthralgias.  Skin: Negative for itching and rash.  Neurological: Negative for extremity weakness and light-headedness.  Hematological: Negative for adenopathy.  Psychiatric/Behavioral: Negative for confusion and sleep disturbance.    MEDICAL HISTORY:  Past Medical History:  Diagnosis Date  . AKI (acute kidney injury) ([place]) 08/17/2018  . Anxiety   . Arthritis   . Belching   . Bladder disorder    OVERACTIVE  . Bowel dysfunction    BLOCKAGE  . Cancer (HCC)    breast  . Constipation   . Depression   . Diverticulitis   . Fibromyalgia   . GERD (gastroesophageal reflux disease)   . Hyperlipidemia   . IBS (irritable bowel syndrome)   . Internal hemorrhoids   . Lung cancer ([place]) 2020  . Memory deficits   . Murmur    asymptomatic  . Pneumonia 11/18/12  . Urinary incontinence   . Vertigo     SURGICAL HISTORY: Past Surgical History:  Procedure Laterality Date  . AXILLARY LYMPH NODE BIOPSY Left 06/08/2018   INVASIVE MAMMARY CARCINOMA  . BLADDER SUSPENSION  2004, 2012  . BREAST BIOPSY Left 06/08/2018   INVASIVE MAMMARY CARCINOMA  . CATARACT EXTRACTION W/PHACO Right 08/27/2015   Procedure: CATARACT EXTRACTION PHACO AND INTRAOCULAR LENS PLACEMENT (IOC);  Surgeon: Estill Cotta, MD;  Location: [facility] ORS;  Service: Ophthalmology;  Laterality: Right;  [place]   1:00.2 AP%  22.5 CDE  23.67 fluid casette lot #[id number]  exp05/31/2018  . CATARACT EXTRACTION W/PHACO Left 10/15/2015   Procedure: CATARACT EXTRACTION PHACO AND INTRAOCULAR LENS PLACEMENT (IOC);  Surgeon: Estill Cotta, MD;  Location: [facility] ORS;  Service: Ophthalmology;  Laterality: Left;  [place] 01:07 AP% 18.1 CDE 21.57 fluid pack lot # [id number]  . CHOLECYSTECTOMY    . COLONOSCOPY  2017  . ELECTROMAGNETIC NAVIGATION BROCHOSCOPY N/A  07/09/2018   Procedure: ELECTROMAGNETIC NAVIGATION BRONCHOSCOPY;  Surgeon: Flora Lipps, MD;  Location: [facility] ORS;  Service: Cardiopulmonary;  Laterality: N/A;  . TONSILLECTOMY  1947    SOCIAL HISTORY: Social History   Socioeconomic History  . Marital status: Married    Spouse name: Bennye Alm  . Number of children: 0  . Years of education: Master's Degree  . Highest education level: Not on file  Occupational History  . Occupation: Retired  Tobacco Use  . Smoking status: Never Smoker  . Smokeless tobacco: Never Used  Vaping Use  . Vaping Use: Never used  Substance and Sexual Activity  . Alcohol use: Not Currently    Comment: rare wine  . Drug use: No  . Sexual activity: Not Currently  Other Topics Concern  . Not on file  Social History Narrative   Recently moved with her husband to [place] from [place].   Husband is a retired Pharmacist, community.   No children.   She is a retired Pharmacist, hospital.   Enjoys: Chief Technology Officer, reading - mysteries and biographies, cooking   Exercise: walking, gardening   Diet: low appetite due to cancer treatment, grazing   She is a DNR.   Social Determinants of Health   Financial Resource Strain: Low Risk   . Difficulty of Paying Living Expenses: Not hard at all  Food Insecurity: No Food Insecurity  . Worried About Charity fundraiser in the Last Year: Never true  . Ran Out  of Food in the Last Year: Never true  Transportation Needs: No Transportation Needs  . Lack of Transportation (Medical): No  . Lack of Transportation (Non-Medical): No  Physical Activity: Inactive  . Days of Exercise per Week: 0 days  . Minutes of Exercise per Session: 0 min  Stress: No Stress Concern Present  . Feeling of Stress : Not at all  Social Connections: Not on file  Intimate Partner Violence: Not At Risk  . Fear of Current or Ex-Partner: No  . Emotionally Abused: No  . Physically Abused: No  . Sexually Abused: No    FAMILY HISTORY: Family History  Problem Relation Age  of Onset  . Diabetes Father   . Heart disease Father   . Lymphoma Father   . Heart disease Mother   . Breast cancer Sister 8  . Colon cancer Neg Hx   . Esophageal cancer Neg Hx   . Rectal cancer Neg Hx   . Stomach cancer Neg Hx   . Bladder Cancer Neg Hx   . Kidney cancer Neg Hx     ALLERGIES:  is allergic to sulfa antibiotics.  MEDICATIONS:  Current Outpatient Medications  Medication Sig Dispense Refill  . acetaminophen (TYLENOL) 325 MG tablet Take 325 mg by mouth every 6 (six) hours as needed for mild pain.     [name] atorvastatin (LIPITOR) 20 MG tablet TAKE 1 TABLET BY MOUTH NIGHTLY 90 tablet 0  . buPROPion (WELLBUTRIN XL) 150 MG 24 hr tablet TAKE ONE TABLET BY MOUTH EVERY DAY (Patient taking differently: Take 150 mg by mouth daily.) 90 tablet 2  . Calcium-Magnesium-Vitamin D (CALCIUM 1200+D3 PO) Take 1 tablet by mouth daily.    [name] docusate sodium (COLACE) 100 MG capsule Take 1 capsule (100 mg total) by mouth daily. (Patient taking differently: Take 100 mg by mouth daily as needed.) 90 capsule 0  . levothyroxine (SYNTHROID) 25 MCG tablet TAKE 1 TABLET BY MOUTH DAILY 90 tablet 3  . memantine (NAMENDA) 10 MG tablet TAKE ONE TABLET BY MOUTH TWICE DAILY (Patient taking differently: Take 10 mg by mouth 2 (two) times daily.) 180 tablet 2  . mirabegron ER (MYRBETRIQ) 50 MG TB24 tablet Take 1 tablet (50 mg total) by mouth daily. 90 tablet 1  . Multiple Vitamin (MULTIVITAMIN WITH MINERALS) TABS tablet Take 1 tablet by mouth daily.    [name] omeprazole (PRILOSEC) 40 MG capsule Take 40 mg by mouth daily.     . ondansetron (ZOFRAN) 8 MG tablet TAKE ONE TABLET EVERY EIGHT HOURS AS NEEDED FOR NAUSEA / VOMITING 60 tablet 1  . polyethylene glycol (MIRALAX / GLYCOLAX) packet Take 17 g by mouth daily as needed.     . venlafaxine XR (EFFEXOR-XR) 150 MG 24 hr capsule TAKE 1 CAPSULE BY MOUTH EVERY DAY 90 capsule 1  . Vitamin D, Ergocalciferol, (DRISDOL) 1.25 MG (50000 UNIT) CAPS capsule Take 50,000 Units by mouth  once a week.    [name] XALKORI 250 MG capsule TAKE 1 CAPSULE (250 MG TOTAL) BY MOUTH 2 TIMES DAILY. 60 capsule 2  . anastrozole (ARIMIDEX) 1 MG tablet Take 1 tablet (1 mg total) by mouth daily. 90 tablet 1  . benzonatate (TESSALON PERLES) 100 MG capsule Take 1 capsule (100 mg total) by mouth 3 (three) times daily as needed for cough. (Patient not taking: Reported on 06/25/2020) 30 capsule 0  . desonide (DESOWEN) 0.05 % lotion Apply 1 application topically as needed (after showering).  (Patient not taking: No sig reported)    .  Dextromethorphan-guaiFENesin (MUCINEX DM) 30-600 MG TB12 Take 1 tablet by mouth 2 (two) times daily as needed (for congestion/cough).  (Patient not taking: Reported on 06/25/2020)    . fluticasone (FLONASE) 50 MCG/ACT nasal spray Place 2 sprays into both nostrils daily. (Patient not taking: No sig reported) 16 g 6  . ketoconazole (NIZORAL) 2 % shampoo Apply 1 application topically as needed. 3 times a week (Patient not taking: No sig reported)    . lubiprostone (AMITIZA) 24 MCG capsule TAKE ONE CAPSULE TWICE A DAY WITH MEALS (Patient not taking: Reported on 06/25/2020) 60 capsule 3  . sucralfate (CARAFATE) 1 g tablet Take 1 tablet (1 g total) by mouth 2 (two) times daily. (Patient not taking: Reported on 06/25/2020) 60 tablet 0   No current facility-administered medications for this visit.     PHYSICAL EXAMINATION: ECOG PERFORMANCE STATUS: 2 - Symptomatic, <50% confined to bed Vitals:   06/25/20 1444  BP: (!) 114/51  Pulse: (!) 55  Resp: 16  Temp: 97.9 F (36.6 C)   Filed Weights   06/25/20 1444  Weight: 150 lb 1.6 oz (68.1 kg)    Physical Exam Constitutional:      General: She is not in acute distress.    Appearance: She is not diaphoretic.     Comments: Frail female, walks with a walker  HENT:     Head: Normocephalic and atraumatic.     Nose: Nose normal.     Mouth/Throat:     Pharynx: No oropharyngeal exudate.  Eyes:     General: No scleral icterus.     Pupils: Pupils are equal, round, and reactive to light.  Cardiovascular:     Rate and Rhythm: Normal rate and regular rhythm.     Heart sounds: No murmur heard.   Pulmonary:     Effort: Pulmonary effort is normal. No respiratory distress.     Breath sounds: No wheezing or rales.  Chest:     Chest wall: No tenderness.  Abdominal:     General: There is no distension.     Palpations: Abdomen is soft.     Tenderness: There is no abdominal tenderness.  Musculoskeletal:        General: Normal range of motion.     Cervical back: Normal range of motion and neck supple.     Comments: Bilateral lower extremity 2+ edema  Skin:    General: Skin is warm and dry.     Findings: No erythema.  Neurological:     Mental Status: She is alert and oriented to person, place, and time.     Cranial Nerves: No cranial nerve deficit.     Motor: No abnormal muscle tone.     Coordination: Coordination normal.  Psychiatric:        Mood and Affect: Affect normal.      CMP Latest Ref Rng & Units 06/25/2020  Glucose 70 - 99 mg/dL 153(H)  BUN 8 - 23 mg/dL 22  Creatinine 0.44 - 1.00 mg/dL 0.97  Sodium 135 - 145 mmol/L 135  Potassium 3.5 - 5.1 mmol/L 4.8  Chloride 98 - 111 mmol/L 101  CO2 22 - 32 mmol/L 28  Calcium 8.9 - 10.3 mg/dL 8.2(L)  Total Protein 6.5 - 8.1 g/dL 6.2(L)  Total Bilirubin 0.3 - 1.2 mg/dL 0.5  Alkaline Phos 38 - 126 U/L 73  AST 15 - 41 U/L 18  ALT 0 - 44 U/L 14   CBC Latest Ref Rng & Units 06/25/2020  WBC 4.0 -  10.5 K/uL 6.2  Hemoglobin 12.0 - 15.0 g/dL 12.0  Hematocrit 36.0 - 46.0 % 36.7  Platelets 150 - 400 K/uL 264    LABORATORY DATA:  I have reviewed the data as listed Lab Results  Component Value Date   WBC 6.2 06/25/2020   HGB 12.0 06/25/2020   HCT 36.7 06/25/2020   MCV 98.1 06/25/2020   PLT 264 06/25/2020   Recent Labs    12/01/19 1316 12/05/19 1200 01/02/20 0832 01/31/20 0917 04/04/20 1243 05/02/20 1337 06/25/20 1418  NA 134*   < > 142 140 140 135 135   K 4.2   < > 5.0 4.1 4.5 4.6 4.8  CL 100   < > 105 107 103 100 101  CO2 27   < > '29 26 29 ' 21* 28  GLUCOSE 109*   < > 132* 102* 100* 84 153*  BUN 33*   < > '19 23 23 ' 35* 22  CREATININE 1.23*   < > 0.95 1.13* 1.08* 1.11* 0.97  CALCIUM 7.2*   < > 8.7* 7.5* 8.8* 8.2* 8.2*  GFRNONAA 41*  --  56* 46* 52* 50* 59*  GFRAA 48*  --  >60 53*  --   --   --   PROT  --   --  6.5 5.8* 6.7 6.9 6.2*  ALBUMIN  --   --  3.5 3.3* 3.6 4.2 3.4*  AST  --   --  '25 25 24 30 18  ' ALT  --   --  '24 25 25 27 14  ' ALKPHOS  --   --  92 82 68 70 73  BILITOT  --   --  0.7 0.7 0.6 0.8 0.5   < > = values in this interval not displayed.   Iron/TIBC/Ferritin/ %Sat No results found for: IRON, TIBC, FERRITIN, IRONPCTSAT   RADIOGRAPHIC STUDIES: I have personally reviewed the radiological images as listed and agreed with the findings in the report. DG Chest 2 View  Result Date: 04/05/2020 CLINICAL DATA:  Cough EXAM: CHEST - 2 VIEW COMPARISON:  07/15/2019 FINDINGS: Cardiac and mediastinal contours normal. Atherosclerotic calcification aortic arch. Pulmonary vascularity normal. Lungs are clear without infiltrate or effusion Multiple chronic right rib fractures present. Calcified granulomata left upper lobe unchanged. IMPRESSION: No active cardiopulmonary disease. Electronically Signed   By: Franchot Gallo M.D.   On: 04/05/2020 09:14   CT Chest Wo Contrast  Result Date: 05/07/2020 CLINICAL DATA:  Stage IV left lung cancer diagnosed in 2020. Breast cancer diagnosed December 2019. Ongoing chemotherapy. Restaging. EXAM: CT CHEST WITHOUT CONTRAST TECHNIQUE: Multidetector CT imaging of the chest was performed following the standard protocol without IV contrast. COMPARISON:  10/21/2019 chest CT. FINDINGS: Cardiovascular: Normal heart size. No significant pericardial effusion/thickening. Left anterior descending and left circumflex coronary atherosclerosis. Atherosclerotic nonaneurysmal thoracic aorta. Normal caliber pulmonary arteries.  Mediastinum/Nodes: No discrete thyroid nodules. Unremarkable esophagus. Faintly calcified top-normal size 0.9 cm left axillary node (series 2/image 37) is stable. No pathologically enlarged axillary lymph nodes. Lungs/Pleura: No pneumothorax. No pleural effusion. Anterior left upper lobe 2.3 x 2.0 cm irregular solid pulmonary nodule (series 3/image 31), previously 2.3 x 2.0 cm on 10/21/2019 chest CT using similar measurement technique, stable. A few previously visualized small pulmonary nodules scattered in the right lung, largest 4 mm in the medial right upper lobe (series 3/image 56), all stable. New tiny 3 mm medial basilar right lower lobe pulmonary nodule (series 3/image 131). Stable scattered calcified granulomas in the left upper lobe. No  acute consolidative airspace disease or additional new significant pulmonary nodules. Upper abdomen: Cholecystectomy. Musculoskeletal: No aggressive appearing focal osseous lesions. Multiple scattered healed right rib deformities. Chronic mild T9 and superior T11 vertebral compression fractures. Moderate thoracic spondylosis. IMPRESSION: 1. New tiny 3 mm medial basilar right lower lobe pulmonary nodule, cannot exclude new pulmonary metastasis. Short-term chest CT follow-up suggested in 3 months. 2. Stable irregular dominant solid 2.3 cm left upper lobe pulmonary nodule. Additional scattered previously visualized small pulmonary nodules are stable. 3. No additional potential findings of new or progressive metastatic disease in the chest. 4. Aortic Atherosclerosis (ICD10-I70.0). Electronically Signed   By: Ilona Sorrel M.D.   On: 05/07/2020 18:08   MR Brain W Wo Contrast  Result Date: 05/22/2020 CLINICAL DATA:  History of lung cancer and breast cancer. Follow-up. EXAM: MRI HEAD WITHOUT AND WITH CONTRAST TECHNIQUE: Multiplanar, multiecho pulse sequences of the brain and surrounding structures were obtained without and with intravenous contrast. CONTRAST:  105m GADAVIST  GADOBUTROL 1 MMOL/ML IV SOLN COMPARISON:  Head CT 10/31/2019.  MRI 10/21/2019. FINDINGS: Brain: Diffusion imaging does not show any acute or subacute infarction or other cause of restricted diffusion. There is age related atrophy with chronic small-vessel ischemic changes of the pons and cerebral hemispheric white matter. No large vessel territory infarction. No sign of primary or metastatic mass, hemorrhage, hydrocephalus or extra-axial collection after contrast administration, no abnormal enhancement occurs per Vascular: Major vessels at the base of the brain show flow. Skull and upper cervical spine: Negative Sinuses/Orbits: Clear/normal Other: None IMPRESSION: No evidence of metastatic disease. Age related atrophy and chronic small-vessel ischemic changes of the pons and cerebral hemispheric white matter. Electronically Signed   By: MNelson ChimesM.D.   On: 05/22/2020 14:23      ASSESSMENT & PLAN:  1. Primary adenocarcinoma of lung, unspecified laterality ([place]   2. Malignant neoplasm of left female breast, unspecified estrogen receptor status, unspecified site of breast ([place]   3. Hypocalcemia   4. Encounter for antineoplastic chemotherapy   5. Aromatase inhibitor use   Cancer Staging Malignant neoplasm of left female breast ([facility] Staging form: Breast, AJCC 8th Edition - Clinical stage from 12/17/2018: Stage IB (cT1b, cN1, cM0, G1, ER+, PR+, HER2-) - Signed by YEarlie Server MD on 12/17/2018  Primary adenocarcinoma of lung ([facility] Staging form: Lung, AJCC 8th Edition - Clinical stage from 09/28/2018: Stage IVA (cT2, cN3, cM1a) - Signed by YEarlie Server MD on 09/28/2018   Patient has 2 primaries.   Stage IV lung adenocarcinoma, met fusion mutation cT2 N3 M1a PDL 1 TPS more than 70%  Continue crizotinib.  CT chest images and MRI brain images in December 2021 were independently reviewed by me and discussed with patient. Overall stable disease.  Tiny small 3 mm major bibasilar right lower lobe pulmonary  nodule, attention on follow-up.   Breast Cancer,  She is not sure if she is still on Arimidex.  Discussed with patient recommend patient to take Arimidex 1 mg daily. Refill was sent to pharmacy.  #hypocalcemia, Corrected calcium is 8.2 hold Xgeva today due to hypocalcemia.  Emphasized importance of taking calcium supplementation.  Follow-up 6 weeks. All questions were answered. The patient knows to call the clinic with any problems questions or concerns.  ZEarlie Server MD, PhD 06/25/2020

## 2020-06-28 ENCOUNTER — Telehealth: Payer: Self-pay | Admitting: *Deleted

## 2020-06-28 NOTE — Telephone Encounter (Signed)
Patient called to request a call back regarding results she received over the phone.

## 2020-06-28 NOTE — Telephone Encounter (Signed)
Called patient for clarification on what results she received yesterday.  Patient reports receiving a call yesterday discussing her lungs and they they were concerned.  I did not see a recent pulmonology work up in her chart or in [place] and she is unsure if she has seen anyone or gone for testing regarding her lungs.  I asked her if the person that called leave a name or contact number but she does not know.  Advised her to call the office back if any further information becomes available in hopes of helping her.

## 2020-07-04 ENCOUNTER — Telehealth: Payer: Medicare Other

## 2020-07-04 ENCOUNTER — Telehealth: Payer: Self-pay

## 2020-07-04 NOTE — Chronic Care Management (AMB) (Addendum)
Chronic Care Management Pharmacy Assistant   Name: Jamie Matthews  MRN: [id number] DOB: [birth year]  Reason for Encounter: Disease State- General  Chart Review: Seen any other providers since last PCP visit? Yes 06/25/20- Dr. Earlie Server- Oncology   PCP : [name], MD  Allergies:   Allergies  Allergen Reactions   Sulfa Antibiotics Rash    Medications: Outpatient Encounter Medications as of 07/04/2020  Medication Sig   acetaminophen (TYLENOL) 325 MG tablet Take 325 mg by mouth every 6 (six) hours as needed for mild pain.    anastrozole (ARIMIDEX) 1 MG tablet Take 1 tablet (1 mg total) by mouth daily.   atorvastatin (LIPITOR) 20 MG tablet TAKE 1 TABLET BY MOUTH NIGHTLY   benzonatate (TESSALON PERLES) 100 MG capsule Take 1 capsule (100 mg total) by mouth 3 (three) times daily as needed for cough. (Patient not taking: Reported on 06/25/2020)   buPROPion (WELLBUTRIN XL) 150 MG 24 hr tablet TAKE ONE TABLET BY MOUTH EVERY DAY (Patient taking differently: Take 150 mg by mouth daily.)   Calcium-Magnesium-Vitamin D (CALCIUM 1200+D3 PO) Take 1 tablet by mouth daily.   desonide (DESOWEN) 0.05 % lotion Apply 1 application topically as needed (after showering).  (Patient not taking: No sig reported)   Dextromethorphan-guaiFENesin (MUCINEX DM) 30-600 MG TB12 Take 1 tablet by mouth 2 (two) times daily as needed (for congestion/cough).  (Patient not taking: Reported on 06/25/2020)   docusate sodium (COLACE) 100 MG capsule Take 1 capsule (100 mg total) by mouth daily. (Patient taking differently: Take 100 mg by mouth daily as needed.)   fluticasone (FLONASE) 50 MCG/ACT nasal spray Place 2 sprays into both nostrils daily. (Patient not taking: No sig reported)   ketoconazole (NIZORAL) 2 % shampoo Apply 1 application topically as needed. 3 times a week (Patient not taking: No sig reported)   levothyroxine (SYNTHROID) 25 MCG tablet TAKE 1 TABLET BY MOUTH DAILY   lubiprostone (AMITIZA) 24 MCG  capsule TAKE ONE CAPSULE TWICE A DAY WITH MEALS (Patient not taking: Reported on 06/25/2020)   memantine (NAMENDA) 10 MG tablet TAKE ONE TABLET BY MOUTH TWICE DAILY (Patient taking differently: Take 10 mg by mouth 2 (two) times daily.)   mirabegron ER (MYRBETRIQ) 50 MG TB24 tablet Take 1 tablet (50 mg total) by mouth daily.   Multiple Vitamin (MULTIVITAMIN WITH MINERALS) TABS tablet Take 1 tablet by mouth daily.   omeprazole (PRILOSEC) 40 MG capsule Take 40 mg by mouth daily.    ondansetron (ZOFRAN) 8 MG tablet TAKE ONE TABLET EVERY EIGHT HOURS AS NEEDED FOR NAUSEA / VOMITING   polyethylene glycol (MIRALAX / GLYCOLAX) packet Take 17 g by mouth daily as needed.    venlafaxine XR (EFFEXOR-XR) 150 MG 24 hr capsule TAKE 1 CAPSULE BY MOUTH EVERY DAY   Vitamin D, Ergocalciferol, (DRISDOL) 1.25 MG (50000 UNIT) CAPS capsule Take 50,000 Units by mouth once a week.   XALKORI 250 MG capsule TAKE 1 CAPSULE (250 MG TOTAL) BY MOUTH 2 TIMES DAILY.   No facility-administered encounter medications on file as of 07/04/2020.    Current Diagnosis: Patient Active Problem List   Diagnosis Date Noted   Aortic valve stenosis 01/19/2020   Left hip pain 12/05/2019   Hypocalcemia 11/02/2019   Trochanteric bursitis 09/20/2019   Pruritus 09/06/2019   Overactive bladder 09/06/2019   Chronic pain of right knee 09/06/2019   Multiple rib fractures 07/15/2019   Fall as cause of accidental injury at home as place of occurrence 07/15/2019  Encounter for antineoplastic chemotherapy 12/17/2018   Lower leg edema 12/17/2018   Aromatase inhibitor use 10/24/2018   Osteopenia 10/24/2018   Fever 10/10/2018   Closed displaced fracture of pubis ([place]) 09/20/2018   Localized swelling of right lower leg 09/20/2018   AKI (acute kidney injury) ([place]) 08/17/2018   Myofascial pain 07/28/2018   Primary adenocarcinoma of lung ([place]) 07/21/2018   Malignant neoplasm of left female breast ([place]) 07/21/2018   Adenopathy    Goals of care,  counseling/discussion 06/26/2018   IBS (irritable bowel syndrome) 05/25/2018   BPV (benign positional vertigo) 07/30/2017   Unilateral primary osteoarthritis, left knee 06/24/2017   Bunion of great toe of right foot 06/09/2017   Dizziness 03/24/2017   Decreased appetite 12/03/2016   Fatigue 09/22/2016   Insomnia 09/22/2016   Depression, recurrent ([place]) 09/22/2016   Chronic cough 08/21/2016   Primary localized osteoarthrosis, hand 03/19/2016   OA (osteoarthritis) of knee 03/17/2016   Stress due to illness of family member 10/10/2014   Ileitis 06/22/2014   Chronic insomnia 09/20/2013   DNR (do not resuscitate) 11/15/2012   Other constipation 04/01/2012   Memory loss 02/05/2012   Urinary incontinence    Major depressive disorder, recurrent, moderate    Gastroesophageal reflux disease    Hyperlipidemia    Multiple attempts were made to contact patient. Unsuccessful outreach.   Contacted [name] for general adherence call. Since last visit with CPP, no interventions have been made. The patient  has not had an ED visit since last contact. The patient's current Chronic and PDC medications are: Atorvastatin 20 mg daily.   Follow-Up:  Pharmacist Review  Debbora Dus, CPP notified  Margaretmary Dys, [place] Assistant [phone]  I have reviewed the care management and care coordination activities outlined in this encounter and I am certifying that I agree with the content of this note. No further action required.  Debbora Dus, PharmD Clinical Pharmacist [facility] at [facility] [phone]

## 2020-07-10 ENCOUNTER — Telehealth: Payer: Self-pay | Admitting: *Deleted

## 2020-07-10 MED FILL — XALKORI 250 MG CAPSULE: 250 | 30 days supply | Qty: 60 | Fill #1

## 2020-07-10 NOTE — Telephone Encounter (Signed)
Patient left a voicemail requesting a call back. Unable to understand the message because of patient coughing. Called patient back and got her voicemail. Left a message for patient to call back.

## 2020-07-11 NOTE — Telephone Encounter (Signed)
Left another message on voicemail for patient to call the office back. 

## 2020-07-11 NOTE — Telephone Encounter (Signed)
Patient has not called the office back.

## 2020-07-13 NOTE — Telephone Encounter (Signed)
Spoke to patient by telephone and was advised that she did not call the office back because she is feeling better. Patient stated that she does not feel that she needs any medical attention. Advised patient to call the office back if she does not continue to improve.

## 2020-07-13 NOTE — Telephone Encounter (Signed)
Noted  

## 2020-08-07 ENCOUNTER — Encounter: Payer: Self-pay | Admitting: Oncology

## 2020-08-07 ENCOUNTER — Inpatient Hospital Stay: Payer: Medicare Other

## 2020-08-07 ENCOUNTER — Inpatient Hospital Stay (HOSPITAL_BASED_OUTPATIENT_CLINIC_OR_DEPARTMENT_OTHER): Payer: Medicare Other | Admitting: Oncology

## 2020-08-07 ENCOUNTER — Inpatient Hospital Stay: Payer: Medicare Other | Attending: Oncology

## 2020-08-07 VITALS — BP 140/53 | HR 53 | Temp 98.0°F | Resp 18 | Wt 156.6 lb

## 2020-08-07 VITALS — BP 154/65 | HR 56 | Resp 18

## 2020-08-07 DIAGNOSIS — C50812 Malignant neoplasm of overlapping sites of left female breast: Secondary | ICD-10-CM | POA: Diagnosis not present

## 2020-08-07 DIAGNOSIS — Z9049 Acquired absence of other specified parts of digestive tract: Secondary | ICD-10-CM | POA: Insufficient documentation

## 2020-08-07 DIAGNOSIS — Z5111 Encounter for antineoplastic chemotherapy: Secondary | ICD-10-CM

## 2020-08-07 DIAGNOSIS — C50912 Malignant neoplasm of unspecified site of left female breast: Secondary | ICD-10-CM

## 2020-08-07 DIAGNOSIS — Z170 Estrogen receptor positive status [ER+]: Secondary | ICD-10-CM | POA: Diagnosis not present

## 2020-08-07 DIAGNOSIS — Z803 Family history of malignant neoplasm of breast: Secondary | ICD-10-CM | POA: Insufficient documentation

## 2020-08-07 DIAGNOSIS — Z8249 Family history of ischemic heart disease and other diseases of the circulatory system: Secondary | ICD-10-CM | POA: Diagnosis not present

## 2020-08-07 DIAGNOSIS — C3490 Malignant neoplasm of unspecified part of unspecified bronchus or lung: Secondary | ICD-10-CM | POA: Diagnosis not present

## 2020-08-07 DIAGNOSIS — Z79899 Other long term (current) drug therapy: Secondary | ICD-10-CM | POA: Diagnosis not present

## 2020-08-07 DIAGNOSIS — M8580 Other specified disorders of bone density and structure, unspecified site: Secondary | ICD-10-CM

## 2020-08-07 DIAGNOSIS — R600 Localized edema: Secondary | ICD-10-CM | POA: Diagnosis not present

## 2020-08-07 DIAGNOSIS — Z808 Family history of malignant neoplasm of other organs or systems: Secondary | ICD-10-CM | POA: Diagnosis not present

## 2020-08-07 DIAGNOSIS — Z8719 Personal history of other diseases of the digestive system: Secondary | ICD-10-CM | POA: Insufficient documentation

## 2020-08-07 DIAGNOSIS — M7989 Other specified soft tissue disorders: Secondary | ICD-10-CM | POA: Diagnosis not present

## 2020-08-07 DIAGNOSIS — C3492 Malignant neoplasm of unspecified part of left bronchus or lung: Secondary | ICD-10-CM

## 2020-08-07 DIAGNOSIS — Z79811 Long term (current) use of aromatase inhibitors: Secondary | ICD-10-CM

## 2020-08-07 DIAGNOSIS — C773 Secondary and unspecified malignant neoplasm of axilla and upper limb lymph nodes: Secondary | ICD-10-CM | POA: Insufficient documentation

## 2020-08-07 DIAGNOSIS — Z882 Allergy status to sulfonamides status: Secondary | ICD-10-CM | POA: Diagnosis not present

## 2020-08-07 DIAGNOSIS — Z833 Family history of diabetes mellitus: Secondary | ICD-10-CM | POA: Diagnosis not present

## 2020-08-07 DIAGNOSIS — E785 Hyperlipidemia, unspecified: Secondary | ICD-10-CM | POA: Diagnosis not present

## 2020-08-07 DIAGNOSIS — R590 Localized enlarged lymph nodes: Secondary | ICD-10-CM | POA: Diagnosis not present

## 2020-08-07 DIAGNOSIS — N179 Acute kidney failure, unspecified: Secondary | ICD-10-CM

## 2020-08-07 DIAGNOSIS — C3412 Malignant neoplasm of upper lobe, left bronchus or lung: Secondary | ICD-10-CM | POA: Insufficient documentation

## 2020-08-07 LAB — CBC WITH DIFFERENTIAL/PLATELET
Abs Immature Granulocytes: 0.04 10*3/uL (ref 0.00–0.07)
Basophils Absolute: 0 10*3/uL (ref 0.0–0.1)
Basophils Relative: 1 %
Eosinophils Absolute: 0.3 10*3/uL (ref 0.0–0.5)
Eosinophils Relative: 6 %
HCT: 34.5 % — ABNORMAL LOW (ref 36.0–46.0)
Hemoglobin: 11.3 g/dL — ABNORMAL LOW (ref 12.0–15.0)
Immature Granulocytes: 1 %
Lymphocytes Relative: 20 %
Lymphs Abs: 1.2 10*3/uL (ref 0.7–4.0)
MCH: 32 pg (ref 26.0–34.0)
MCHC: 32.8 g/dL (ref 30.0–36.0)
MCV: 97.7 fL (ref 80.0–100.0)
Monocytes Absolute: 0.9 10*3/uL (ref 0.1–1.0)
Monocytes Relative: 14 %
Neutro Abs: 3.5 10*3/uL (ref 1.7–7.7)
Neutrophils Relative %: 58 %
Platelets: 277 10*3/uL (ref 150–400)
RBC: 3.53 MIL/uL — ABNORMAL LOW (ref 3.87–5.11)
RDW: 13.6 % (ref 11.5–15.5)
WBC: 6 10*3/uL (ref 4.0–10.5)
nRBC: 0 % (ref 0.0–0.2)

## 2020-08-07 LAB — COMPREHENSIVE METABOLIC PANEL
ALT: 17 U/L (ref 0–44)
AST: 23 U/L (ref 15–41)
Albumin: 3.7 g/dL (ref 3.5–5.0)
Alkaline Phosphatase: 76 U/L (ref 38–126)
Anion gap: 8 (ref 5–15)
BUN: 24 mg/dL — ABNORMAL HIGH (ref 8–23)
CO2: 25 mmol/L (ref 22–32)
Calcium: 7.5 mg/dL — ABNORMAL LOW (ref 8.9–10.3)
Chloride: 108 mmol/L (ref 98–111)
Creatinine, Ser: 1.02 mg/dL — ABNORMAL HIGH (ref 0.44–1.00)
GFR, Estimated: 55 mL/min — ABNORMAL LOW (ref >60)
Glucose, Bld: 89 mg/dL (ref 70–99)
Potassium: 5.1 mmol/L (ref 3.5–5.1)
Sodium: 141 mmol/L (ref 135–145)
Total Bilirubin: 0.8 mg/dL (ref 0.3–1.2)
Total Protein: 6.2 g/dL — ABNORMAL LOW (ref 6.5–8.1)

## 2020-08-07 MED ORDER — SODIUM CHLORIDE 0.9 % IV SOLN
Freq: Once | INTRAVENOUS | Status: AC
  Filled 2020-08-07: qty 250

## 2020-08-07 MED ORDER — SODIUM CHLORIDE 0.9 % IV SOLN
2.0000 g | Freq: Once | INTRAVENOUS | Status: AC
  Administered 2020-08-07: 2 g via INTRAVENOUS
  Filled 2020-08-07: qty 20

## 2020-08-07 NOTE — Progress Notes (Signed)
Hematology/Oncology follow up note [facility] [phone] [phone]   Patient Care Team: [name], MD as PCP - General (Family Medicine) Pyrtle, Lajuan Lines, MD as Consulting Physician (Gastroenterology) Telford Nab, RN as Registered Nurse Earlie Server, MD as Consulting Physician (Hematology and Oncology) Debbora Dus, [facility] as Pharmacist (Pharmacist) Burnice Logan, [facility] as Pharmacist (Pharmacist) Ralene Bathe, MD (Dermatology) Festus Aloe, MD as Consulting Physician (Urology)   REASON FOR VISIT:  Follow up for management of lung cancer and breast cancer  HISTORY OF PRESENTING ILLNESS:  Jamie Matthews is a  80 y.o.  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 05/05/2018 bilateral diagnostic breast mammogram showed suspicious mass 1.1cm  in the 12:00 retroareolar region of the left breast and the left axillary lymph node. 06/08/2018 patient status post a left breast retroareolar and left axillary lymph node biopsy. Pathology showed invasive mammary carcinoma, no special type, grade 1, left axillary lymph node positive for invasive mammary carcinoma clinically metastatic.  Background lymph node architecture is not identified. ER> 90% PR> 90%, HER-2 negative.  PET scan done which unfortunately showed additional hypermetabolic bilateral hilar lymph nodes as well as left upper lobe lung mass which may represent a focus of metastatic disease from breast, or primary lung neoplasm.  There is multiple additional small pulmonary nodules are scattered throughout both lungs which are worrisome for metastasis.  Index nodule within the medial right upper lobe measures 7 mm,, peri-broncho-vascular nodule in the right lower lobe measures 1.2 cm.  # Stage IV lung cancer-  Biopsy of lung mass left upper lobe showed non-small cell lung cancer, favor adenocarcinoma.  #NGS came back patient has PD L1  is 70% TPS, MET fusion mutation.  #Mid-March 2020 started on crizotinib  Bilateral lower extremity edema, right >left, Ultrasound venous right 09/20/2018 no DVT.  2D echo 10/10/2018 showed LVEF 55 to 60% Most likely secondary to crizotinib side effects.  # 12/15/2018 CT chest images were independently reviewed and discussed with patient. Dominant left upper lobe nodule and multiple scattered irregular pulmonary parenchymal nodular lesions are stable. Mild basilar pulmonary parenchymal septal thickening is new and can be seen with pulmonary edema. Patient reports no shortness of breath asymptomatic  #12/29/2018 unilateral left diagnostic mammogram with ultrasound images were independently reviewed and discussed with patient. Slight interval reduction of the size of the left retroareolar breast mass, 9 x 6 x 7 mm, comparing to 10 x 9 x 8 mm. Axillary lymph node measured 1.3 x 1.0 x 1.0 cm, previously 2.2 x 1.4 x 1.5 cm,  there were 2 other abdominal lymph nodes in the left axilla, which were not reported in previous ultrasound. Discussed with patient that overall, breast cancer responded to endocrine treatments.  Additional 2 lymph nodes need to be closely monitored.  Recommend patient to continue Arimidex.   INTERVAL HISTORY Jamie Matthews is a 80 y.o. female who has above history reviewed by me today presents fornon-small cell lung cancer and breast cancer management. Patient reports no new complaints.  She is taking crizotinib.  She is not sure if she is taking Arimidex or calcium supplementation.  Denies any lower extremity cramps.  No nausea vomiting diarrhea shortness of breath chest pain abdominal pain. Chronic lower extremity edema, unchanged.  Review of Systems  Constitutional: Negative for appetite change, chills, fatigue and fever.  HENT:   Negative for hearing loss and voice change.   Eyes:  Negative for eye problems.  Respiratory: Negative for chest tightness and  cough.   Cardiovascular: Positive for leg swelling. Negative for chest pain.  Gastrointestinal: Negative for abdominal distention, abdominal pain, blood in stool, constipation and nausea.  Endocrine: Negative for hot flashes.  Genitourinary: Negative for difficulty urinating and frequency.   Musculoskeletal: Negative for arthralgias.  Skin: Negative for itching and rash.  Neurological: Negative for extremity weakness and light-headedness.  Hematological: Negative for adenopathy.  Psychiatric/Behavioral: Negative for confusion and sleep disturbance.    MEDICAL HISTORY:  Past Medical History:  Diagnosis Date  . AKI (acute kidney injury) ([place]) 08/17/2018  . Anxiety   . Arthritis   . Belching   . Bladder disorder    OVERACTIVE  . Bowel dysfunction    BLOCKAGE  . Cancer (HCC)    breast  . Constipation   . Depression   . Diverticulitis   . Fibromyalgia   . GERD (gastroesophageal reflux disease)   . Hyperlipidemia   . IBS (irritable bowel syndrome)   . Internal hemorrhoids   . Lung cancer ([place]) 2020  . Memory deficits   . Murmur    asymptomatic  . Pneumonia 11/18/12  . Urinary incontinence   . Vertigo     SURGICAL HISTORY: Past Surgical History:  Procedure Laterality Date  . AXILLARY LYMPH NODE BIOPSY Left 06/08/2018   INVASIVE MAMMARY CARCINOMA  . BLADDER SUSPENSION  2004, 2012  . BREAST BIOPSY Left 06/08/2018   INVASIVE MAMMARY CARCINOMA  . CATARACT EXTRACTION W/PHACO Right 08/27/2015   Procedure: CATARACT EXTRACTION PHACO AND INTRAOCULAR LENS PLACEMENT (IOC);  Surgeon: Estill Cotta, MD;  Location: [facility] ORS;  Service: Ophthalmology;  Laterality: Right;  [place]   1:00.2 AP%  22.5 CDE  23.67 fluid casette lot #[id number]  exp05/31/2018  . CATARACT EXTRACTION W/PHACO Left 10/15/2015   Procedure: CATARACT EXTRACTION PHACO AND INTRAOCULAR LENS PLACEMENT (IOC);  Surgeon: Estill Cotta, MD;  Location: [facility] ORS;  Service: Ophthalmology;  Laterality: Left;  [place] 01:07 AP%  18.1 CDE 21.57 fluid pack lot # [id number]  . CHOLECYSTECTOMY    . COLONOSCOPY  2017  . ELECTROMAGNETIC NAVIGATION BROCHOSCOPY N/A 07/09/2018   Procedure: ELECTROMAGNETIC NAVIGATION BRONCHOSCOPY;  Surgeon: Flora Lipps, MD;  Location: [facility] ORS;  Service: Cardiopulmonary;  Laterality: N/A;  . TONSILLECTOMY  1947    SOCIAL HISTORY: Social History   Socioeconomic History  . Marital status: Married    Spouse name: Bennye Alm  . Number of children: 0  . Years of education: Master's Degree  . Highest education level: Not on file  Occupational History  . Occupation: Retired  Tobacco Use  . Smoking status: Never Smoker  . Smokeless tobacco: Never Used  Vaping Use  . Vaping Use: Never used  Substance and Sexual Activity  . Alcohol use: Not Currently    Comment: rare wine  . Drug use: No  . Sexual activity: Not Currently  Other Topics Concern  . Not on file  Social History Narrative   Recently moved with her husband to [place] from [place].   Husband is a retired Pharmacist, community.   No children.   She is a retired Pharmacist, hospital.   Enjoys: Chief Technology Officer, reading - mysteries and biographies, cooking   Exercise: walking, gardening   Diet: low appetite due to cancer treatment, grazing   She is a DNR.   Social Determinants of Health   Financial Resource Strain: Low Risk   . Difficulty of Paying Living Expenses: Not hard at all  Food Insecurity: No  Food Insecurity  . Worried About Charity fundraiser in the Last Year: Never true  . Ran Out of Food in the Last Year: Never true  Transportation Needs: No Transportation Needs  . Lack of Transportation (Medical): No  . Lack of Transportation (Non-Medical): No  Physical Activity: Inactive  . Days of Exercise per Week: 0 days  . Minutes of Exercise per Session: 0 min  Stress: No Stress Concern Present  . Feeling of Stress : Not at all  Social Connections: Not on file  Intimate Partner Violence: Not At Risk  . Fear of Current or Ex-Partner: No  .  Emotionally Abused: No  . Physically Abused: No  . Sexually Abused: No    FAMILY HISTORY: Family History  Problem Relation Age of Onset  . Diabetes Father   . Heart disease Father   . Lymphoma Father   . Heart disease Mother   . Breast cancer Sister 2  . Colon cancer Neg Hx   . Esophageal cancer Neg Hx   . Rectal cancer Neg Hx   . Stomach cancer Neg Hx   . Bladder Cancer Neg Hx   . Kidney cancer Neg Hx     ALLERGIES:  is allergic to sulfa antibiotics.  MEDICATIONS:  Current Outpatient Medications  Medication Sig Dispense Refill  . acetaminophen (TYLENOL) 325 MG tablet Take 325 mg by mouth every 6 (six) hours as needed for mild pain.     [name] anastrozole (ARIMIDEX) 1 MG tablet Take 1 tablet (1 mg total) by mouth daily. 90 tablet 1  . atorvastatin (LIPITOR) 20 MG tablet TAKE 1 TABLET BY MOUTH NIGHTLY 90 tablet 0  . buPROPion (WELLBUTRIN XL) 150 MG 24 hr tablet TAKE ONE TABLET BY MOUTH EVERY DAY (Patient taking differently: Take 150 mg by mouth daily.) 90 tablet 2  . Calcium-Magnesium-Vitamin D (CALCIUM 1200+D3 PO) Take 1 tablet by mouth daily.    [name] docusate sodium (COLACE) 100 MG capsule Take 1 capsule (100 mg total) by mouth daily. (Patient taking differently: Take 100 mg by mouth daily as needed.) 90 capsule 0  . fluticasone (FLONASE) 50 MCG/ACT nasal spray Place 2 sprays into both nostrils daily. 16 g 6  . ketoconazole (NIZORAL) 2 % shampoo Apply 1 application topically as needed. 3 times a week    . levothyroxine (SYNTHROID) 25 MCG tablet TAKE 1 TABLET BY MOUTH DAILY 90 tablet 3  . memantine (NAMENDA) 10 MG tablet TAKE ONE TABLET BY MOUTH TWICE DAILY (Patient taking differently: Take 10 mg by mouth 2 (two) times daily.) 180 tablet 2  . mirabegron ER (MYRBETRIQ) 50 MG TB24 tablet Take 1 tablet (50 mg total) by mouth daily. 90 tablet 1  . Multiple Vitamin (MULTIVITAMIN WITH MINERALS) TABS tablet Take 1 tablet by mouth daily.    [name] omeprazole (PRILOSEC) 40 MG capsule Take 40 mg by  mouth daily.     . ondansetron (ZOFRAN) 8 MG tablet TAKE ONE TABLET EVERY EIGHT HOURS AS NEEDED FOR NAUSEA / VOMITING 60 tablet 1  . polyethylene glycol (MIRALAX / GLYCOLAX) packet Take 17 g by mouth daily as needed.     . venlafaxine XR (EFFEXOR-XR) 150 MG 24 hr capsule TAKE 1 CAPSULE BY MOUTH EVERY DAY 90 capsule 1  . Vitamin D, Ergocalciferol, (DRISDOL) 1.25 MG (50000 UNIT) CAPS capsule Take 50,000 Units by mouth once a week.    [name] XALKORI 250 MG capsule TAKE 1 CAPSULE (250 MG TOTAL) BY MOUTH 2 TIMES DAILY. 60 capsule 2  .  benzonatate (TESSALON PERLES) 100 MG capsule Take 1 capsule (100 mg total) by mouth 3 (three) times daily as needed for cough. (Patient not taking: No sig reported) 30 capsule 0  . desonide (DESOWEN) 0.05 % lotion Apply 1 application topically as needed (after showering).  (Patient not taking: Reported on 08/07/2020)    . Dextromethorphan-guaiFENesin (MUCINEX DM) 30-600 MG TB12 Take 1 tablet by mouth 2 (two) times daily as needed (for congestion/cough).  (Patient not taking: No sig reported)    . lubiprostone (AMITIZA) 24 MCG capsule TAKE ONE CAPSULE TWICE A DAY WITH MEALS (Patient not taking: No sig reported) 60 capsule 3   No current facility-administered medications for this visit.     PHYSICAL EXAMINATION: ECOG PERFORMANCE STATUS: 2 - Symptomatic, <50% confined to bed Vitals:   08/07/20 1313  BP: (!) 140/53  Pulse: (!) 53  Resp: 18  Temp: 98 F (36.7 C)   Filed Weights   08/07/20 1313  Weight: 156 lb 9.6 oz (71 kg)    Physical Exam Constitutional:      General: She is not in acute distress.    Appearance: She is not diaphoretic.     Comments: Frail female, walks with a walker  HENT:     Head: Normocephalic and atraumatic.     Nose: Nose normal.     Mouth/Throat:     Pharynx: No oropharyngeal exudate.  Eyes:     General: No scleral icterus.    Pupils: Pupils are equal, round, and reactive to light.  Cardiovascular:     Rate and Rhythm: Normal rate  and regular rhythm.     Heart sounds: No murmur heard.   Pulmonary:     Effort: Pulmonary effort is normal. No respiratory distress.     Breath sounds: No wheezing or rales.  Chest:     Chest wall: No tenderness.  Abdominal:     General: There is no distension.     Palpations: Abdomen is soft.     Tenderness: There is no abdominal tenderness.  Musculoskeletal:        General: Normal range of motion.     Cervical back: Normal range of motion and neck supple.     Comments: Bilateral lower extremity 2+ edema  Skin:    General: Skin is warm and dry.     Findings: No erythema.  Neurological:     Mental Status: She is alert and oriented to person, place, and time.     Cranial Nerves: No cranial nerve deficit.     Motor: No abnormal muscle tone.     Coordination: Coordination normal.  Psychiatric:        Mood and Affect: Affect normal.      CMP Latest Ref Rng & Units 08/07/2020  Glucose 70 - 99 mg/dL 89  BUN 8 - 23 mg/dL 24(H)  Creatinine 0.44 - 1.00 mg/dL 1.02(H)  Sodium 135 - 145 mmol/L 141  Potassium 3.5 - 5.1 mmol/L 5.1  Chloride 98 - 111 mmol/L 108  CO2 22 - 32 mmol/L 25  Calcium 8.9 - 10.3 mg/dL 7.5(L)  Total Protein 6.5 - 8.1 g/dL 6.2(L)  Total Bilirubin 0.3 - 1.2 mg/dL 0.8  Alkaline Phos 38 - 126 U/L 76  AST 15 - 41 U/L 23  ALT 0 - 44 U/L 17   CBC Latest Ref Rng & Units 08/07/2020  WBC 4.0 - 10.5 K/uL 6.0  Hemoglobin 12.0 - 15.0 g/dL 11.3(L)  Hematocrit 36.0 - 46.0 % 34.5(L)  Platelets  150 - 400 K/uL 277    LABORATORY DATA:  I have reviewed the data as listed Lab Results  Component Value Date   WBC 6.0 08/07/2020   HGB 11.3 (L) 08/07/2020   HCT 34.5 (L) 08/07/2020   MCV 97.7 08/07/2020   PLT 277 08/07/2020   Recent Labs    12/01/19 1316 12/05/19 1200 01/02/20 0832 01/31/20 0917 04/04/20 1243 05/02/20 1337 06/25/20 1418 08/07/20 1237  NA 134*   < > 142 140   < > 135 135 141  K 4.2   < > 5.0 4.1   < > 4.6 4.8 5.1  CL 100   < > 105 107   < > 100  101 108  CO2 27   < > 29 26   < > 21* 28 25  GLUCOSE 109*   < > 132* 102*   < > 84 153* 89  BUN 33*   < > 19 23   < > 35* 22 24*  CREATININE 1.23*   < > 0.95 1.13*   < > 1.11* 0.97 1.02*  CALCIUM 7.2*   < > 8.7* 7.5*   < > 8.2* 8.2* 7.5*  GFRNONAA 41*  --  56* 46*   < > 50* 59* 55*  GFRAA 48*  --  >60 53*  --   --   --   --   PROT  --   --  6.5 5.8*   < > 6.9 6.2* 6.2*  ALBUMIN  --   --  3.5 3.3*   < > 4.2 3.4* 3.7  AST  --   --  25 25   < > '30 18 23  ' ALT  --   --  24 25   < > '27 14 17  ' ALKPHOS  --   --  92 82   < > 70 73 76  BILITOT  --   --  0.7 0.7   < > 0.8 0.5 0.8   < > = values in this interval not displayed.   Iron/TIBC/Ferritin/ %Sat No results found for: IRON, TIBC, FERRITIN, IRONPCTSAT   RADIOGRAPHIC STUDIES: I have personally reviewed the radiological images as listed and agreed with the findings in the report. MR Brain W Wo Contrast  Result Date: 05/22/2020 CLINICAL DATA:  History of lung cancer and breast cancer. Follow-up. EXAM: MRI HEAD WITHOUT AND WITH CONTRAST TECHNIQUE: Multiplanar, multiecho pulse sequences of the brain and surrounding structures were obtained without and with intravenous contrast. CONTRAST:  96m GADAVIST GADOBUTROL 1 MMOL/ML IV SOLN COMPARISON:  Head CT 10/31/2019.  MRI 10/21/2019. FINDINGS: Brain: Diffusion imaging does not show any acute or subacute infarction or other cause of restricted diffusion. There is age related atrophy with chronic small-vessel ischemic changes of the pons and cerebral hemispheric white matter. No large vessel territory infarction. No sign of primary or metastatic mass, hemorrhage, hydrocephalus or extra-axial collection after contrast administration, no abnormal enhancement occurs per Vascular: Major vessels at the base of the brain show flow. Skull and upper cervical spine: Negative Sinuses/Orbits: Clear/normal Other: None IMPRESSION: No evidence of metastatic disease. Age related atrophy and chronic small-vessel ischemic  changes of the pons and cerebral hemispheric white matter. Electronically Signed   By: MNelson ChimesM.D.   On: 05/22/2020 14:23      ASSESSMENT & PLAN:  1. Primary adenocarcinoma of lung, unspecified laterality ([place]   2. Malignant neoplasm of left female breast, unspecified estrogen receptor status, unspecified site of breast ([place]  3. Encounter for antineoplastic chemotherapy   4. Aromatase inhibitor use   Cancer Staging Malignant neoplasm of left female breast [facility]) Staging form: Breast, AJCC 8th Edition - Clinical stage from 12/17/2018: Stage IB (cT1b, cN1, cM0, G1, ER+, PR+, HER2-) - Signed by Earlie Server, MD on 12/17/2018  Primary adenocarcinoma of lung [facility]) Staging form: Lung, AJCC 8th Edition - Clinical stage from 09/28/2018: Stage IVA (cT2, cN3, cM1a) - Signed by Earlie Server, MD on 09/28/2018   Patient has 2 primaries.   Stage IV lung adenocarcinoma, met fusion mutation cT2 N3 M1a PDL 1 TPS more than 70%  Labs reviewed and discussed with patient Continue crizotinib. Plan to obtain CT chest abdomen pelvis with contrast for evaluation and monitoring disease.   Breast Cancer,  She is not sure if she is still on Arimidex.  Advised patient to check with her medication and continue Arimidex treatments. Refills were sent to pharmacy.  Patient has becoming more forgetful and I offered her to consider home care for medication management and she declined.  #hypocalcemia, Calcium level 7.5.  Patient received IV calcium gluconate 2 g x 1 today.  I recommend patient to take calcium 1200 mg daily. I will hold Xgeva due to the hypocalcemia today. Follow-up 4 weeks. All questions were answered. The patient knows to call the clinic with any problems questions or concerns.  Earlie Server, MD, PhD 08/07/2020

## 2020-08-07 NOTE — Progress Notes (Signed)
Patient here for follow up. She reports that she was experiencing some pain to left breast last week.

## 2020-08-07 NOTE — Patient Instructions (Signed)
Calcium gluconate injection What is this medicine? CALCIUM GLUCONATE (KAL see um GLOO koe nate) is a calcium salt. It is used to prevent and to treat low calcium levels. This medicine may be used for other purposes; ask your health care provider or pharmacist if you have questions. What should I tell my health care provider before I take this medicine? They need to know if you have any of these conditions:  high levels of calcium in the blood  history of irregular heartbeat  history of kidney stones  kidney disease  parathyroid disease  an unusual or allergic reaction to calcium, other medicines, foods, dyes, or preservatives  pregnant or trying to get pregnant  breast-feeding How should I use this medicine? This medicine is for infusion into a vein. It is given by a health care professional in a hospital or clinic setting. Talk to your pediatrician regarding the use of this medicine in children. While this drug may be prescribed for selected conditions, precautions do apply. Overdosage: If you think you have taken too much of this medicine contact a poison control center or emergency room at once. NOTE: This medicine is only for you. Do not share this medicine with others. What if I miss a dose? This does not apply. What may interact with this medicine?  ceftriaxone  certain diuretics  digoxin  other calcium products This list may not describe all possible interactions. Give your health care provider a list of all the medicines, herbs, non-prescription drugs, or dietary supplements you use. Also tell them if you smoke, drink alcohol, or use illegal drugs. Some items may interact with your medicine. What should I watch for while using this medicine? Your condition will be monitored carefully while you are receiving this medicine. You may need blood work done while you are taking this medicine. What side effects may I notice from receiving this medicine? Side effects that  you should report to your doctor or health care professional as soon as possible:  allergic reactions like skin rash, itching or hives, swelling of the face, lips, or tongue  fast, irregular heartbeat  low blood pressure  signs and symptoms of high calcium like thirst, severe or continued nausea or vomiting, constipation, stomach pain, muscle weakness, fatigue, confusion, or difficulty in concentration Side effects that usually do not require medical attention (report these to your doctor or health care professional if they continue or are bothersome):  changes in taste  tingling in hands or feet This list may not describe all possible side effects. Call your doctor for medical advice about side effects. You may report side effects to FDA at 1-800-FDA-1088. Where should I keep my medicine? This drug is given in a hospital or clinic and will not be stored at home. NOTE: This sheet is a summary. It may not cover all possible information. If you have questions about this medicine, talk to your doctor, pharmacist, or health care provider.  2021 Elsevier/Gold Standard (2016-12-23 11:47:26)

## 2020-08-07 NOTE — Progress Notes (Signed)
Patient added on for a 2 hour infusion of 2 gram calcium gluconate for hypocalcemia. IV site #22 PIV in Right AC.Pt denies any complaints at time of discharge. Called [facility] for transportation.Accompanied pt to lobby. She walks with aid of a walker.

## 2020-08-13 ENCOUNTER — Ambulatory Visit: Payer: Medicare Other | Admitting: Family Medicine

## 2020-08-16 ENCOUNTER — Ambulatory Visit (INDEPENDENT_AMBULATORY_CARE_PROVIDER_SITE_OTHER): Payer: Medicare Other | Admitting: Family Medicine

## 2020-08-16 ENCOUNTER — Ambulatory Visit (INDEPENDENT_AMBULATORY_CARE_PROVIDER_SITE_OTHER)
Admission: RE | Admit: 2020-08-16 | Discharge: 2020-08-16 | Disposition: A | Discharge status: Home / Self Care | Payer: Medicare Other | Source: Ambulatory Visit | Attending: Family Medicine | Admitting: Family Medicine

## 2020-08-16 ENCOUNTER — Encounter: Payer: Self-pay | Admitting: Family Medicine

## 2020-08-16 ENCOUNTER — Other Ambulatory Visit: Payer: Self-pay

## 2020-08-16 VITALS — BP 122/74 | HR 67 | Temp 98.1°F | Ht 63.0 in | Wt 159.2 lb

## 2020-08-16 DIAGNOSIS — M1711 Unilateral primary osteoarthritis, right knee: Secondary | ICD-10-CM | POA: Diagnosis not present

## 2020-08-16 DIAGNOSIS — M25561 Pain in right knee: Secondary | ICD-10-CM | POA: Diagnosis not present

## 2020-08-16 MED ORDER — TRIAMCINOLONE ACETONIDE 40 MG/ML IJ SUSP
40.0000 mg | Freq: Once | INTRAMUSCULAR | Status: AC
  Administered 2020-08-16: 40 mg via INTRA_ARTICULAR

## 2020-08-16 NOTE — Progress Notes (Signed)
Jamie Tenbrink T. Taryll Reichenberger, MD, [facility]  Primary Care and Sports Medicine [facility] [place], [address]  Phone: [phone]   FAX: [phone]  Jamie Matthews - 80 y.o. female   MRN [id number]   Date of Birth: 1940-12-14  Date: 08/16/2020   PCP: [name], MD   Referral: [name], MD  Chief Complaint  Patient presents with   Knee Pain    Right    This visit occurred during the SARS-CoV-2 public health emergency.  Safety protocols were in place, including screening questions prior to the visit, additional usage of staff PPE, and extensive cleaning of exam room while observing appropriate contact time as indicated for disinfecting solutions.   Subjective:   Jamie Matthews is a 80 y.o. very pleasant female patient with Body mass index is 28.21 kg/m. 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  I do not have an old knee x-ray on the right for comparison.  She does have pain worse on the lateral compartment, lateral aspect of the knee, but she also has pain medially and at the kneecap.  She has not had any new specific injury, trauma, fractures previously, or prior operative intervention. She can do basic things at home such as cooking cleaning, in general maintenance of her household.  She is otherwise not all that particularly active and does not do regular more extensive exercise.  Review of Systems is noted in the HPI, as appropriate   Objective:   BP 122/74    Pulse 67    Temp 98.1 F (36.7 C) (Temporal)    Ht 5\' 3"  (1.6 m)    Wt 159 lb 4 oz (72.2 kg)    SpO2 94%    BMI 28.21 kg/m   Right knee: She does lack 3 degrees of extension.  Flexion to 100.  She does have pain with loading the medial lateral patellar  facets.  Stable to varus and valgus stress.  Lachman and drawer testing are all negative.  He does have some lateral greater than medial joint line tenderness.  She has no significant tenderness at the tibial plateau, patella, or throughout the lower extremity with the exception of the knee.  She does have pain with any forced flexion, and she does not have any remarkable mechanical symptoms, and no mechanical symptoms with McMurray's.  Radiology: No results found.   Assessment and Plan:     ICD-10-CM   1. Primary osteoarthritis of right knee  M17.11 DG Knee 4 Views W/Patella Right    triamcinolone acetonide (KENALOG-40) injection 40 mg   Acute on chronic end-stage degenerative joint disease of the right knee with complete loss of joint space laterally with significant subchondral sclerosis.  This is an end-stage knee, and I do not think anything short of a total knee arthroplasty would provide her with a longstanding relief of symptoms.  At 89, she has no interest in doing any surgery.  I recommended a few things to try topically.  I think that anything we can do to make her more comfortable is appropriate.  Aspiration/Injection Procedure Note Jamie Matthews 02-Oct-1940 Date of procedure: 08/16/2020  Procedure: Large Joint Aspiration / Injection of Knee, R Indications: Pain  Procedure Details Patient  verbally consented to procedure. Risks, benefits, and alternatives explained. Sterilely prepped with Chloraprep. Ethyl cholride used for anesthesia. 9 cc Lidocaine 1% mixed with 1 mL of Kenalog 40 mg injected using the anteromedial approach without difficulty. No complications with procedure and tolerated well. Patient had decreased pain post-injection. Medication: 1 mL of Kenalog 40 mg  Patient Instructions  Voltaren 1% gel. Over the counter You can apply up to 4 times a day Minimal is absorbed in the bloodstream Cost is about 9 dollars   Lidocaine 4% Can use as much  as you want.    Meds ordered this encounter  Medications   triamcinolone acetonide (KENALOG-40) injection 40 mg   There are no discontinued medications. Orders Placed This Encounter  Procedures   DG Knee 4 Views W/Patella Right    Follow-up: As needed only  Signed,  [name], MD   Outpatient Encounter Medications as of 08/16/2020  Medication Sig   acetaminophen (TYLENOL) 325 MG tablet Take 325 mg by mouth every 6 (six) hours as needed for mild pain.    anastrozole (ARIMIDEX) 1 MG tablet Take 1 tablet (1 mg total) by mouth daily.   atorvastatin (LIPITOR) 20 MG tablet TAKE 1 TABLET BY MOUTH NIGHTLY   benzonatate (TESSALON PERLES) 100 MG capsule Take 1 capsule (100 mg total) by mouth 3 (three) times daily as needed for cough.   buPROPion (WELLBUTRIN XL) 150 MG 24 hr tablet TAKE ONE TABLET BY MOUTH EVERY DAY (Patient taking differently: Take 150 mg by mouth daily.)   Calcium-Magnesium-Vitamin D (CALCIUM 1200+D3 PO) Take 1 tablet by mouth daily.   desonide (DESOWEN) 0.05 % lotion Apply 1 application topically as needed (after showering).   Dextromethorphan-guaiFENesin (MUCINEX DM) 30-600 MG TB12 Take 1 tablet by mouth 2 (two) times daily as needed (for congestion/cough).   docusate sodium (COLACE) 100 MG capsule Take 1 capsule (100 mg total) by mouth daily. (Patient taking differently: Take 100 mg by mouth daily as needed.)   fluticasone (FLONASE) 50 MCG/ACT nasal spray Place 2 sprays into both nostrils daily.   ketoconazole (NIZORAL) 2 % shampoo Apply 1 application topically as needed. 3 times a week   levothyroxine (SYNTHROID) 25 MCG tablet TAKE 1 TABLET BY MOUTH DAILY   lubiprostone (AMITIZA) 24 MCG capsule TAKE ONE CAPSULE TWICE A DAY WITH MEALS   memantine (NAMENDA) 10 MG tablet TAKE ONE TABLET BY MOUTH TWICE DAILY (Patient taking differently: Take 10 mg by mouth 2 (two) times daily.)   mirabegron ER (MYRBETRIQ) 50 MG TB24 tablet Take 1 tablet (50 mg  total) by mouth daily.   Multiple Vitamin (MULTIVITAMIN WITH MINERALS) TABS tablet Take 1 tablet by mouth daily.   omeprazole (PRILOSEC) 40 MG capsule Take 40 mg by mouth daily.    ondansetron (ZOFRAN) 8 MG tablet TAKE ONE TABLET EVERY EIGHT HOURS AS NEEDED FOR NAUSEA / VOMITING   polyethylene glycol (MIRALAX / GLYCOLAX) packet Take 17 g by mouth daily as needed.    venlafaxine XR (EFFEXOR-XR) 150 MG 24 hr capsule TAKE 1 CAPSULE BY MOUTH EVERY DAY   Vitamin D, Ergocalciferol, (DRISDOL) 1.25 MG (50000 UNIT) CAPS capsule Take 50,000 Units by mouth once a week.   XALKORI 250 MG capsule TAKE 1 CAPSULE (250 MG TOTAL) BY MOUTH 2 TIMES DAILY.   [EXPIRED] triamcinolone acetonide (KENALOG-40) injection 40 mg    No facility-administered encounter medications on file as of 08/16/2020.

## 2020-08-16 NOTE — Patient Instructions (Signed)
Voltaren 1% gel. Over the counter You can apply up to 4 times a day Minimal is absorbed in the bloodstream Cost is about 9 dollars   Lidocaine 4% Can use as much as you want.

## 2020-08-22 ENCOUNTER — Ambulatory Visit: Admission: RE | Admit: 2020-08-22 | Payer: Medicare Other | Source: Ambulatory Visit

## 2020-08-24 ENCOUNTER — Other Ambulatory Visit (HOSPITAL_COMMUNITY): Payer: Self-pay

## 2020-08-28 ENCOUNTER — Ambulatory Visit: Payer: Medicare Other | Admitting: Family Medicine

## 2020-08-28 DIAGNOSIS — Z0289 Encounter for other administrative examinations: Secondary | ICD-10-CM

## 2020-09-03 ENCOUNTER — Other Ambulatory Visit (HOSPITAL_COMMUNITY): Payer: Self-pay

## 2020-09-04 ENCOUNTER — Inpatient Hospital Stay: Payer: Medicare Other

## 2020-09-04 ENCOUNTER — Inpatient Hospital Stay: Payer: Medicare Other | Attending: Oncology | Admitting: Oncology

## 2020-09-04 ENCOUNTER — Encounter: Payer: Self-pay | Admitting: Oncology

## 2020-09-04 ENCOUNTER — Other Ambulatory Visit (HOSPITAL_COMMUNITY): Payer: Self-pay

## 2020-09-04 VITALS — BP 118/53 | HR 55 | Temp 97.8°F | Resp 20 | Wt 142.2 lb

## 2020-09-04 DIAGNOSIS — Z170 Estrogen receptor positive status [ER+]: Secondary | ICD-10-CM | POA: Diagnosis not present

## 2020-09-04 DIAGNOSIS — K219 Gastro-esophageal reflux disease without esophagitis: Secondary | ICD-10-CM | POA: Diagnosis not present

## 2020-09-04 DIAGNOSIS — C50012 Malignant neoplasm of nipple and areola, left female breast: Secondary | ICD-10-CM | POA: Insufficient documentation

## 2020-09-04 DIAGNOSIS — C3412 Malignant neoplasm of upper lobe, left bronchus or lung: Secondary | ICD-10-CM | POA: Diagnosis not present

## 2020-09-04 DIAGNOSIS — Z803 Family history of malignant neoplasm of breast: Secondary | ICD-10-CM | POA: Insufficient documentation

## 2020-09-04 DIAGNOSIS — C3490 Malignant neoplasm of unspecified part of unspecified bronchus or lung: Secondary | ICD-10-CM

## 2020-09-04 DIAGNOSIS — C773 Secondary and unspecified malignant neoplasm of axilla and upper limb lymph nodes: Secondary | ICD-10-CM | POA: Diagnosis not present

## 2020-09-04 DIAGNOSIS — Z79899 Other long term (current) drug therapy: Secondary | ICD-10-CM | POA: Insufficient documentation

## 2020-09-04 DIAGNOSIS — Z9049 Acquired absence of other specified parts of digestive tract: Secondary | ICD-10-CM | POA: Diagnosis not present

## 2020-09-04 DIAGNOSIS — Z8719 Personal history of other diseases of the digestive system: Secondary | ICD-10-CM | POA: Diagnosis not present

## 2020-09-04 DIAGNOSIS — Z5111 Encounter for antineoplastic chemotherapy: Secondary | ICD-10-CM | POA: Diagnosis not present

## 2020-09-04 DIAGNOSIS — Z882 Allergy status to sulfonamides status: Secondary | ICD-10-CM | POA: Diagnosis not present

## 2020-09-04 DIAGNOSIS — M170 Bilateral primary osteoarthritis of knee: Secondary | ICD-10-CM | POA: Insufficient documentation

## 2020-09-04 DIAGNOSIS — Z8249 Family history of ischemic heart disease and other diseases of the circulatory system: Secondary | ICD-10-CM | POA: Diagnosis not present

## 2020-09-04 DIAGNOSIS — M8580 Other specified disorders of bone density and structure, unspecified site: Secondary | ICD-10-CM

## 2020-09-04 DIAGNOSIS — Z833 Family history of diabetes mellitus: Secondary | ICD-10-CM | POA: Diagnosis not present

## 2020-09-04 DIAGNOSIS — C50912 Malignant neoplasm of unspecified site of left female breast: Secondary | ICD-10-CM

## 2020-09-04 DIAGNOSIS — C3492 Malignant neoplasm of unspecified part of left bronchus or lung: Secondary | ICD-10-CM

## 2020-09-04 DIAGNOSIS — R600 Localized edema: Secondary | ICD-10-CM | POA: Insufficient documentation

## 2020-09-04 DIAGNOSIS — Z808 Family history of malignant neoplasm of other organs or systems: Secondary | ICD-10-CM | POA: Insufficient documentation

## 2020-09-04 DIAGNOSIS — Z79811 Long term (current) use of aromatase inhibitors: Secondary | ICD-10-CM | POA: Diagnosis not present

## 2020-09-04 LAB — COMPREHENSIVE METABOLIC PANEL
ALT: 20 U/L (ref 0–44)
AST: 22 U/L (ref 15–41)
Albumin: 3.7 g/dL (ref 3.5–5.0)
Alkaline Phosphatase: 65 U/L (ref 38–126)
Anion gap: 7 (ref 5–15)
BUN: 22 mg/dL (ref 8–23)
CO2: 27 mmol/L (ref 22–32)
Calcium: 8 mg/dL — ABNORMAL LOW (ref 8.9–10.3)
Chloride: 106 mmol/L (ref 98–111)
Creatinine, Ser: 0.94 mg/dL (ref 0.44–1.00)
GFR, Estimated: >60 mL/min (ref >60)
Glucose, Bld: 136 mg/dL — ABNORMAL HIGH (ref 70–99)
Potassium: 4.1 mmol/L (ref 3.5–5.1)
Sodium: 140 mmol/L (ref 135–145)
Total Bilirubin: 0.6 mg/dL (ref 0.3–1.2)
Total Protein: 6.2 g/dL — ABNORMAL LOW (ref 6.5–8.1)

## 2020-09-04 LAB — CBC WITH DIFFERENTIAL/PLATELET
Abs Immature Granulocytes: 0.04 10*3/uL (ref 0.00–0.07)
Basophils Absolute: 0 10*3/uL (ref 0.0–0.1)
Basophils Relative: 1 %
Eosinophils Absolute: 0.3 10*3/uL (ref 0.0–0.5)
Eosinophils Relative: 5 %
HCT: 36 % (ref 36.0–46.0)
Hemoglobin: 12 g/dL (ref 12.0–15.0)
Immature Granulocytes: 1 %
Lymphocytes Relative: 27 %
Lymphs Abs: 1.6 10*3/uL (ref 0.7–4.0)
MCH: 32.5 pg (ref 26.0–34.0)
MCHC: 33.3 g/dL (ref 30.0–36.0)
MCV: 97.6 fL (ref 80.0–100.0)
Monocytes Absolute: 0.9 10*3/uL (ref 0.1–1.0)
Monocytes Relative: 15 %
Neutro Abs: 3 10*3/uL (ref 1.7–7.7)
Neutrophils Relative %: 51 %
Platelets: 216 10*3/uL (ref 150–400)
RBC: 3.69 MIL/uL — ABNORMAL LOW (ref 3.87–5.11)
RDW: 13.4 % (ref 11.5–15.5)
WBC: 5.8 10*3/uL (ref 4.0–10.5)
nRBC: 0 % (ref 0.0–0.2)

## 2020-09-04 MED ORDER — CALCIUM-VITAMIN D 600-200 MG-UNIT PO TABS: 2.0000 | ORAL_TABLET | Freq: Every day | ORAL | 0 refills | Status: DC

## 2020-09-04 NOTE — Progress Notes (Signed)
Hematology/Oncology follow up note [facility] [phone] [phone]   Patient Care Team: [name], MD as PCP - General (Family Medicine) Pyrtle, Lajuan Lines, MD as Consulting Physician (Gastroenterology) Telford Nab, RN as Registered Nurse Earlie Server, MD as Consulting Physician (Hematology and Oncology) Debbora Dus, [facility] as Pharmacist (Pharmacist) Burnice Logan, [facility] as Pharmacist (Pharmacist) Ralene Bathe, MD (Dermatology) Festus Aloe, MD as Consulting Physician (Urology)   REASON FOR VISIT:  Follow up for management of lung cancer and breast cancer  HISTORY OF PRESENTING ILLNESS:  Jamie Matthews is a  80 y.o.  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 05/05/2018 bilateral diagnostic breast mammogram showed suspicious mass 1.1cm  in the 12:00 retroareolar region of the left breast and the left axillary lymph node. 06/08/2018 patient status post a left breast retroareolar and left axillary lymph node biopsy. Pathology showed invasive mammary carcinoma, no special type, grade 1, left axillary lymph node positive for invasive mammary carcinoma clinically metastatic.  Background lymph node architecture is not identified. ER> 90% PR> 90%, HER-2 negative.  PET scan done which unfortunately showed additional hypermetabolic bilateral hilar lymph nodes as well as left upper lobe lung mass which may represent a focus of metastatic disease from breast, or primary lung neoplasm.  There is multiple additional small pulmonary nodules are scattered throughout both lungs which are worrisome for metastasis.  Index nodule within the medial right upper lobe measures 7 mm,, peri-broncho-vascular nodule in the right lower lobe measures 1.2 cm.  # Stage IV lung cancer-  Biopsy of lung mass left upper lobe showed non-small cell lung cancer, favor adenocarcinoma.  #NGS came back patient has PD L1  is 70% TPS, MET fusion mutation.  #Mid-March 2020 started on crizotinib  Bilateral lower extremity edema, right >left, Ultrasound venous right 09/20/2018 no DVT.  2D echo 10/10/2018 showed LVEF 55 to 60% Most likely secondary to crizotinib side effects.  # 12/15/2018 CT chest images were independently reviewed and discussed with patient. Dominant left upper lobe nodule and multiple scattered irregular pulmonary parenchymal nodular lesions are stable. Mild basilar pulmonary parenchymal septal thickening is new and can be seen with pulmonary edema. Patient reports no shortness of breath asymptomatic  #12/29/2018 unilateral left diagnostic mammogram with ultrasound images were independently reviewed and discussed with patient. Slight interval reduction of the size of the left retroareolar breast mass, 9 x 6 x 7 mm, comparing to 10 x 9 x 8 mm. Axillary lymph node measured 1.3 x 1.0 x 1.0 cm, previously 2.2 x 1.4 x 1.5 cm,  there were 2 other abdominal lymph nodes in the left axilla, which were not reported in previous ultrasound. Discussed with patient that overall, breast cancer responded to endocrine treatments.  Additional 2 lymph nodes need to be closely monitored.  Recommend patient to continue Arimidex.   INTERVAL HISTORY Jamie Matthews is a 80 y.o. female who has above history reviewed by me today presents fornon-small cell lung cancer and breast cancer management. Patient reports no new complaints.  She is taking crizotinib.  She is not sure if she is taking Arimidex or calcium She has no new complaints.  Chronic lower extremity swelling. No recent fall episodes   Review of Systems  Constitutional: Negative for appetite change, chills, fatigue and fever.  HENT:   Negative for hearing loss and voice change.   Eyes: Negative for eye problems.  Respiratory: Negative for chest  tightness and cough.   Cardiovascular: Positive for leg swelling. Negative for chest pain.   Gastrointestinal: Negative for abdominal distention, abdominal pain, blood in stool, constipation and nausea.  Endocrine: Negative for hot flashes.  Genitourinary: Negative for difficulty urinating and frequency.   Musculoskeletal: Negative for arthralgias.  Skin: Negative for itching and rash.  Neurological: Negative for extremity weakness and light-headedness.  Hematological: Negative for adenopathy.  Psychiatric/Behavioral: Negative for confusion and sleep disturbance.    MEDICAL HISTORY:  Past Medical History:  Diagnosis Date  . AKI (acute kidney injury) ([place]) 08/17/2018  . Anxiety   . Arthritis   . Belching   . Bladder disorder    OVERACTIVE  . Bowel dysfunction    BLOCKAGE  . Cancer (HCC)    breast  . Constipation   . Depression   . Diverticulitis   . Fibromyalgia   . GERD (gastroesophageal reflux disease)   . Hyperlipidemia   . IBS (irritable bowel syndrome)   . Internal hemorrhoids   . Lung cancer ([place]) 2020  . Memory deficits   . Murmur    asymptomatic  . Pneumonia 11/18/12  . Urinary incontinence   . Vertigo     SURGICAL HISTORY: Past Surgical History:  Procedure Laterality Date  . AXILLARY LYMPH NODE BIOPSY Left 06/08/2018   INVASIVE MAMMARY CARCINOMA  . BLADDER SUSPENSION  2004, 2012  . BREAST BIOPSY Left 06/08/2018   INVASIVE MAMMARY CARCINOMA  . CATARACT EXTRACTION W/PHACO Right 08/27/2015   Procedure: CATARACT EXTRACTION PHACO AND INTRAOCULAR LENS PLACEMENT (IOC);  Surgeon: Estill Cotta, MD;  Location: [facility] ORS;  Service: Ophthalmology;  Laterality: Right;  [place]   1:00.2 AP%  22.5 CDE  23.67 fluid casette lot #[id number]  exp05/31/2018  . CATARACT EXTRACTION W/PHACO Left 10/15/2015   Procedure: CATARACT EXTRACTION PHACO AND INTRAOCULAR LENS PLACEMENT (IOC);  Surgeon: Estill Cotta, MD;  Location: [facility] ORS;  Service: Ophthalmology;  Laterality: Left;  [place] 01:07 AP% 18.1 CDE 21.57 fluid pack lot # [id number]  . CHOLECYSTECTOMY    . COLONOSCOPY   2017  . ELECTROMAGNETIC NAVIGATION BROCHOSCOPY N/A 07/09/2018   Procedure: ELECTROMAGNETIC NAVIGATION BRONCHOSCOPY;  Surgeon: Flora Lipps, MD;  Location: [facility] ORS;  Service: Cardiopulmonary;  Laterality: N/A;  . TONSILLECTOMY  1947    SOCIAL HISTORY: Social History   Socioeconomic History  . Marital status: Married    Spouse name: Bennye Alm  . Number of children: 0  . Years of education: Master's Degree  . Highest education level: Not on file  Occupational History  . Occupation: Retired  Tobacco Use  . Smoking status: Never Smoker  . Smokeless tobacco: Never Used  Vaping Use  . Vaping Use: Never used  Substance and Sexual Activity  . Alcohol use: Not Currently    Comment: rare wine  . Drug use: No  . Sexual activity: Not Currently  Other Topics Concern  . Not on file  Social History Narrative   Recently moved with her husband to [place] from [place].   Husband is a retired Pharmacist, community.   No children.   She is a retired Pharmacist, hospital.   Enjoys: Chief Technology Officer, reading - mysteries and biographies, cooking   Exercise: walking, gardening   Diet: low appetite due to cancer treatment, grazing   She is a DNR.   Social Determinants of Health   Financial Resource Strain: Low Risk   . Difficulty of Paying Living Expenses: Not hard at all  Food Insecurity: No Food Insecurity  . Worried About [employer] of  Food in the Last Year: Never true  . Ran Out of Food in the Last Year: Never true  Transportation Needs: No Transportation Needs  . Lack of Transportation (Medical): No  . Lack of Transportation (Non-Medical): No  Physical Activity: Inactive  . Days of Exercise per Week: 0 days  . Minutes of Exercise per Session: 0 min  Stress: No Stress Concern Present  . Feeling of Stress : Not at all  Social Connections: Not on file  Intimate Partner Violence: Not At Risk  . Fear of Current or Ex-Partner: No  . Emotionally Abused: No  . Physically Abused: No  . Sexually Abused: No     FAMILY HISTORY: Family History  Problem Relation Age of Onset  . Diabetes Father   . Heart disease Father   . Lymphoma Father   . Heart disease Mother   . Breast cancer Sister 17  . Colon cancer Neg Hx   . Esophageal cancer Neg Hx   . Rectal cancer Neg Hx   . Stomach cancer Neg Hx   . Bladder Cancer Neg Hx   . Kidney cancer Neg Hx     ALLERGIES:  is allergic to sulfa antibiotics.  MEDICATIONS:  Current Outpatient Medications  Medication Sig Dispense Refill  . acetaminophen (TYLENOL) 325 MG tablet Take 325 mg by mouth every 6 (six) hours as needed for mild pain.     [name] anastrozole (ARIMIDEX) 1 MG tablet Take 1 tablet (1 mg total) by mouth daily. 90 tablet 1  . atorvastatin (LIPITOR) 20 MG tablet TAKE 1 TABLET BY MOUTH NIGHTLY 90 tablet 0  . buPROPion (WELLBUTRIN XL) 150 MG 24 hr tablet TAKE ONE TABLET BY MOUTH EVERY DAY (Patient taking differently: Take 150 mg by mouth daily.) 90 tablet 2  . Calcium-Magnesium-Vitamin D (CALCIUM 1200+D3 PO) Take 1 tablet by mouth daily.    . Calcium-Vitamin D 600-200 MG-UNIT tablet Take 2 tablets by mouth daily. 60 tablet 0  . crizotinib (XALKORI) 250 MG capsule TAKE 1 CAPSULE (250 MG TOTAL) BY MOUTH 2 TIMES DAILY. 60 capsule 2  . desonide (DESOWEN) 0.05 % lotion Apply 1 application topically as needed (after showering).    [name] ketoconazole (NIZORAL) 2 % shampoo Apply 1 application topically as needed. 3 times a week    . levothyroxine (SYNTHROID) 25 MCG tablet TAKE 1 TABLET BY MOUTH DAILY 90 tablet 3  . lubiprostone (AMITIZA) 24 MCG capsule TAKE ONE CAPSULE TWICE A DAY WITH MEALS 60 capsule 3  . mirabegron ER (MYRBETRIQ) 50 MG TB24 tablet Take 1 tablet (50 mg total) by mouth daily. 90 tablet 1  . Multiple Vitamin (MULTIVITAMIN WITH MINERALS) TABS tablet Take 1 tablet by mouth daily.    [name] omeprazole (PRILOSEC) 40 MG capsule Take 40 mg by mouth daily.     . ondansetron (ZOFRAN) 8 MG tablet TAKE ONE TABLET EVERY EIGHT HOURS AS NEEDED FOR NAUSEA /  VOMITING 60 tablet 1  . venlafaxine XR (EFFEXOR-XR) 150 MG 24 hr capsule TAKE 1 CAPSULE BY MOUTH EVERY DAY 90 capsule 1  . Vitamin D, Ergocalciferol, (DRISDOL) 1.25 MG (50000 UNIT) CAPS capsule Take 50,000 Units by mouth once a week.    . benzonatate (TESSALON PERLES) 100 MG capsule Take 1 capsule (100 mg total) by mouth 3 (three) times daily as needed for cough. (Patient not taking: Reported on 09/04/2020) 30 capsule 0  . Dextromethorphan-guaiFENesin (MUCINEX DM) 30-600 MG TB12 Take 1 tablet by mouth 2 (two) times daily as needed (for congestion/cough). (  Patient not taking: Reported on 09/04/2020)    . docusate sodium (COLACE) 100 MG capsule Take 1 capsule (100 mg total) by mouth daily. (Patient not taking: Reported on 09/04/2020) 90 capsule 0  . fluticasone (FLONASE) 50 MCG/ACT nasal spray Place 2 sprays into both nostrils daily. (Patient not taking: Reported on 09/04/2020) 16 g 6  . memantine (NAMENDA) 10 MG tablet TAKE ONE TABLET BY MOUTH TWICE DAILY (Patient not taking: Reported on 09/04/2020) 180 tablet 2  . polyethylene glycol (MIRALAX / GLYCOLAX) packet Take 17 g by mouth daily as needed.  (Patient not taking: Reported on 09/04/2020)     No current facility-administered medications for this visit.     PHYSICAL EXAMINATION: ECOG PERFORMANCE STATUS: 2 - Symptomatic, <50% confined to bed Vitals:   09/04/20 1337  BP: (!) 118/53  Pulse: (!) 55  Resp: 20  Temp: 97.8 F (36.6 C)  SpO2: 98%   Filed Weights   09/04/20 1337  Weight: 142 lb 3.2 oz (64.5 kg)    Physical Exam Constitutional:      General: She is not in acute distress.    Appearance: She is not diaphoretic.     Comments: Frail female, walks with a walker  HENT:     Head: Normocephalic and atraumatic.     Nose: Nose normal.     Mouth/Throat:     Pharynx: No oropharyngeal exudate.  Eyes:     General: No scleral icterus.    Pupils: Pupils are equal, round, and reactive to light.  Cardiovascular:     Rate and Rhythm:  Normal rate and regular rhythm.     Heart sounds: No murmur heard.   Pulmonary:     Effort: Pulmonary effort is normal. No respiratory distress.     Breath sounds: No wheezing or rales.  Chest:     Chest wall: No tenderness.  Abdominal:     General: There is no distension.     Palpations: Abdomen is soft.     Tenderness: There is no abdominal tenderness.  Musculoskeletal:        General: Normal range of motion.     Cervical back: Normal range of motion and neck supple.     Comments: Bilateral lower extremity 2+ edema  Skin:    General: Skin is warm and dry.     Findings: No erythema.  Neurological:     Mental Status: She is alert and oriented to person, place, and time.     Cranial Nerves: No cranial nerve deficit.     Motor: No abnormal muscle tone.     Coordination: Coordination normal.  Psychiatric:        Mood and Affect: Affect normal.      CMP Latest Ref Rng & Units 09/04/2020  Glucose 70 - 99 mg/dL 136(H)  BUN 8 - 23 mg/dL 22  Creatinine 0.44 - 1.00 mg/dL 0.94  Sodium 135 - 145 mmol/L 140  Potassium 3.5 - 5.1 mmol/L 4.1  Chloride 98 - 111 mmol/L 106  CO2 22 - 32 mmol/L 27  Calcium 8.9 - 10.3 mg/dL 8.0(L)  Total Protein 6.5 - 8.1 g/dL 6.2(L)  Total Bilirubin 0.3 - 1.2 mg/dL 0.6  Alkaline Phos 38 - 126 U/L 65  AST 15 - 41 U/L 22  ALT 0 - 44 U/L 20   CBC Latest Ref Rng & Units 09/04/2020  WBC 4.0 - 10.5 K/uL 5.8  Hemoglobin 12.0 - 15.0 g/dL 12.0  Hematocrit 36.0 - 46.0 % 36.0  Platelets  150 - 400 K/uL 216    LABORATORY DATA:  I have reviewed the data as listed Lab Results  Component Value Date   WBC 5.8 09/04/2020   HGB 12.0 09/04/2020   HCT 36.0 09/04/2020   MCV 97.6 09/04/2020   PLT 216 09/04/2020   Recent Labs    12/01/19 1316 12/05/19 1200 01/02/20 0832 01/31/20 0917 04/04/20 1243 06/25/20 1418 08/07/20 1237 09/04/20 1317  NA 134*   < > 142 140   < > 135 141 140  K 4.2   < > 5.0 4.1   < > 4.8 5.1 4.1  CL 100   < > 105 107   < > 101 108  106  CO2 27   < > 29 26   < > _0 GLUCOSE 109*   < > 132* 102*   < > 153* 89 136*  BUN 33*   < > 19 23   < > 22 24* 22  CREATININE 1.23*   < > 0.95 1.13*   < > 0.97 1.02* 0.94  CALCIUM 7.2*   < > 8.7* 7.5*   < > 8.2* 7.5* 8.0*  GFRNONAA 41*  --  56* 46*   < > 59* 55* >60  GFRAA 48*  --  >60 53*  --   --   --   --   PROT  --   --  6.5 5.8*   < > 6.2* 6.2* 6.2*  ALBUMIN  --   --  3.5 3.3*   < > 3.4* 3.7 3.7  AST  --   --  25 25   < > _1 ALT  --   --  24 25   < > _2 ALKPHOS  --   --  92 82   < > 73 76 65  BILITOT  --   --  0.7 0.7   < > 0.5 0.8 0.6   < > = values in this interval not displayed.   Iron/TIBC/Ferritin/ %Sat No results found for: IRON, TIBC, FERRITIN, IRONPCTSAT   RADIOGRAPHIC STUDIES: I have personally reviewed the radiological images as listed and agreed with the findings in the report. DG Knee 4 Views W/Patella Right  Result Date: 08/18/2020 CLINICAL DATA:  Chronic right knee pain. EXAM: RIGHT KNEE - COMPLETE 4+ VIEW COMPARISON:  None. FINDINGS: Right knee: No fracture or dislocation. Tricompartmental degenerative changes are seen, most marked in the lateral compartment with loss of joint space. No fracture, dislocation, or joint effusion. A single anterior view of the left knee demonstrates degenerative changes in the medial and lateral compartments with loss of joint space in the lateral compartment on weight-bearing views. IMPRESSION: 1. 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 2. A single anterior view of the left knee demonstrates degenerative changes well with significant loss of joint space laterally on weight-bearing views. Electronically Signed   By: Dorise Bullion III M.D   On: 08/18/2020 10:40      ASSESSMENT & PLAN:  1. Encounter for antineoplastic chemotherapy   2. Malignant neoplasm of left female breast, unspecified estrogen receptor  status, unspecified site of breast ([place])   3. Primary adenocarcinoma of lung, unspecified laterality ([place])   4. Aromatase inhibitor use   5. Hypocalcemia   Cancer Staging Malignant neoplasm of left female breast [facility]) Staging form: Breast, AJCC 8th Edition - Clinical stage  from 12/17/2018: Stage IB (cT1b, cN1, cM0, G1, ER+, PR+, HER2-) - Signed by Earlie Server, MD on 12/17/2018  Primary adenocarcinoma of lung [facility]) Staging form: Lung, AJCC 8th Edition - Clinical stage from 09/28/2018: Stage IVA (cT2, cN3, cM1a) - Signed by Earlie Server, MD on 09/28/2018   Patient has 2 primaries.   Stage IV lung adenocarcinoma, met fusion mutation cT2 N3 M1a PDL 1 TPS more than 70%  Labs reviewed and discussed with patient. She is clinically doing well Continue crizotinib. She missed her appointment for CT chest abdomen pelvis.  We will reschedule   Breast Cancer,  Recommend patient to continue Arimidex.  #Memory loss, Patient has becoming more forgetful and I offered her to consider home care for medication management and she declined. I have previously discussed with her primary care provider.  #hypocalcemia, Calcium level 8.   I recommend patient to take calcium 1200 mg daily.  She is not sure if she still has over-the-counter supply.  She prefers me to send her prescription which I did. I will hold Xgeva due to the hypocalcemia today. Follow-up 4 weeks. All questions were answered. The patient knows to call the clinic with any problems questions or concerns.  Earlie Server, MD, PhD 09/04/2020

## 2020-09-05 ENCOUNTER — Telehealth: Payer: Self-pay

## 2020-09-05 NOTE — Telephone Encounter (Signed)
Agree with discussing with oncology

## 2020-09-05 NOTE — Telephone Encounter (Signed)
Patient was advised to call oncologist in regards to her chemotherapy medication.

## 2020-09-05 NOTE — Telephone Encounter (Signed)
[facility] - Client TELEPHONE ADVICE RECORD [facility] Patient Name: Jamie Matthews EN Gender: Female DOB: 04-Mar-1941 Age: 80 Y 4 D Return Phone Number: [phone] (Primary), [phone] (Secondary) Address: City/ State/ Zip: [place]  [address] Client [facility] - Client Client Site [place] - Day Physician Waunita Schooner- MD Contact Type Call Who Is Calling Patient / Member / Family / Caregiver Call Type Triage / Clinical Relationship To Patient Self Return Phone Number [phone] (Primary) Chief Complaint Abdominal Pain Reason for Call Symptomatic / Request for [place] states that she has an upset stomach. [place] Not Listed call Oncologist Translation No Nurse Assessment Nurse: Rock Nephew, RN, Juliann Pulse Date/Time (Eastern Time): 09/05/2020 1:12:56 PM Confirm and document reason for call. If symptomatic, describe symptoms. ---Caller states that she has an upset stomach ( nausea ) but no vomiting . Does the patient have any new or worsening symptoms? ---Yes Will a triage be completed? ---Yes Related visit to physician within the last 2 weeks? ---No Does the PT have any chronic conditions? (i.e. diabetes, asthma, this includes High risk factors for pregnancy, etc.) ---No Is this a behavioral health or substance abuse call? ---No Guidelines Guideline Title Affirmed Question Affirmed Notes Nurse Date/Time (Eastern Time) Cancer - Nausea and Vomiting [1] MILD or MODERATE nausea AND [2] NO symptoms of dehydration or new weight loss Rock Nephew, RN, Juliann Pulse 09/05/2020 1:14:08 PM Disp. Time Eilene Ghazi Time) Disposition Final User 09/05/2020 1:18:27 PM Call PCP within 24 Hours Yes Rock Nephew, RN, Juliann Pulse Disposition Overriden: Home Care Override Reason: Patient's symptoms need a higher level of care PLEASE NOTE: All timestamps contained within this report are  represented as [place] Standard Time. CONFIDENTIALTY NOTICE: This fax transmission is intended only for the addressee. It contains information that is legally privileged, confidential or otherwise protected from use or disclosure. If you are not the intended recipient, you are strictly prohibited from reviewing, disclosing, copying using or disseminating any of this information or taking any action in reliance on or regarding this information. If you have received this fax in error, please notify us immediately by telephone so that we can arrange for its return to [place]. Phone: [phone], Toll-Free: [phone], Fax: [phone] Page: 2 of 2 Call Id: [id number] [place] Disagree/Comply Comply Caller Understands Yes PreDisposition Call Doctor Care Advice Given Per Guideline CALL PCP WITHIN 24 HOURS: * You need to discuss this with your doctor (or NP/PA) within the next 24 hours. * IF OFFICE WILL BE OPEN: Call the office when it opens tomorrow morning. CALL BACK IF: * You become worse CARE ADVICE per Cancer - Nausea and Vomiting (Adult) guideline. Comments User: Valetta Mole, RN Date/Time Eilene Ghazi Time): 09/05/2020 1:20:01 PM Patient stated she takes daily oral chemotherapy so I advised she call her Oncologist. Referrals GO TO [place]

## 2020-09-07 ENCOUNTER — Other Ambulatory Visit (HOSPITAL_COMMUNITY): Payer: Self-pay

## 2020-09-07 MED FILL — Crizotinib Cap 250 MG: ORAL | 30 days supply | Qty: 60 | Fill #0 | Status: AC

## 2020-09-21 ENCOUNTER — Ambulatory Visit
Admission: RE | Admit: 2020-09-21 | Discharge: 2020-09-21 | Disposition: A | Discharge status: Home / Self Care | Payer: Medicare Other | Source: Ambulatory Visit | Attending: Oncology | Admitting: Oncology

## 2020-09-21 ENCOUNTER — Other Ambulatory Visit: Payer: Self-pay

## 2020-09-21 DIAGNOSIS — C3490 Malignant neoplasm of unspecified part of unspecified bronchus or lung: Secondary | ICD-10-CM | POA: Insufficient documentation

## 2020-09-21 DIAGNOSIS — I2510 Atherosclerotic heart disease of native coronary artery without angina pectoris: Secondary | ICD-10-CM | POA: Diagnosis not present

## 2020-09-21 DIAGNOSIS — C50912 Malignant neoplasm of unspecified site of left female breast: Secondary | ICD-10-CM | POA: Diagnosis not present

## 2020-09-21 DIAGNOSIS — I700 Atherosclerosis of aorta: Secondary | ICD-10-CM | POA: Diagnosis not present

## 2020-09-21 DIAGNOSIS — S32501A Unspecified fracture of right pubis, initial encounter for closed fracture: Secondary | ICD-10-CM | POA: Diagnosis not present

## 2020-09-21 DIAGNOSIS — C3492 Malignant neoplasm of unspecified part of left bronchus or lung: Secondary | ICD-10-CM | POA: Diagnosis not present

## 2020-10-02 ENCOUNTER — Inpatient Hospital Stay: Payer: Medicare Other | Admitting: Oncology

## 2020-10-02 ENCOUNTER — Telehealth: Payer: Self-pay | Admitting: Oncology

## 2020-10-02 ENCOUNTER — Inpatient Hospital Stay: Payer: Medicare Other

## 2020-10-02 ENCOUNTER — Other Ambulatory Visit (HOSPITAL_COMMUNITY): Payer: Self-pay

## 2020-10-02 NOTE — Telephone Encounter (Signed)
Patient left message to cancel her appointment because she is not feeling well.  I called patient to reschedule but her husband states she is in bed and will call back to reschedule.

## 2020-10-03 ENCOUNTER — Telehealth: Payer: Self-pay

## 2020-10-03 NOTE — Chronic Care Management (AMB) (Addendum)
Chronic Care Management Pharmacy Assistant   Name: Jamie Matthews  MRN: [id number] DOB: Feb 06, 1941  Reason for Encounter: Adherence Review  Recent office visits:  08/16/2020  Dr.Copland, Family Medicine - Right knee cortisone injection, recommend OTC Voltaren 1% gel and lidocaine 4% ointment  Recent consult visits:  09/04/2020  Dr.Zhou Tasia Catchings  Oncology - Added Calcium-VitaminD 600-200mg  2 tablets daily. Recommend patient to continue Arimidex. F/u 4 weeks 08/07/2020  Dr.Zhou Tasia Catchings  Oncology - No medication changes, follow up 4 weeks  Hospital visits:  None in previous 6 months  Medications: Outpatient Encounter Medications as of 10/03/2020  Medication Sig   acetaminophen (TYLENOL) 325 MG tablet Take 325 mg by mouth every 6 (six) hours as needed for mild pain.    anastrozole (ARIMIDEX) 1 MG tablet Take 1 tablet (1 mg total) by mouth daily.   atorvastatin (LIPITOR) 20 MG tablet TAKE 1 TABLET BY MOUTH NIGHTLY   benzonatate (TESSALON PERLES) 100 MG capsule Take 1 capsule (100 mg total) by mouth 3 (three) times daily as needed for cough. (Patient not taking: Reported on 09/04/2020)   buPROPion (WELLBUTRIN XL) 150 MG 24 hr tablet TAKE ONE TABLET BY MOUTH EVERY DAY (Patient taking differently: Take 150 mg by mouth daily.)   Calcium-Magnesium-Vitamin D (CALCIUM 1200+D3 PO) Take 1 tablet by mouth daily.   Calcium-Vitamin D 600-200 MG-UNIT tablet Take 2 tablets by mouth daily.   crizotinib (XALKORI) 250 MG capsule TAKE 1 CAPSULE (250 MG TOTAL) BY MOUTH 2 TIMES DAILY.   desonide (DESOWEN) 0.05 % lotion Apply 1 application topically as needed (after showering).   Dextromethorphan-guaiFENesin (MUCINEX DM) 30-600 MG TB12 Take 1 tablet by mouth 2 (two) times daily as needed (for congestion/cough). (Patient not taking: Reported on 09/04/2020)   docusate sodium (COLACE) 100 MG capsule Take 1 capsule (100 mg total) by mouth daily. (Patient not taking: Reported on 09/04/2020)   fluticasone (FLONASE)  50 MCG/ACT nasal spray Place 2 sprays into both nostrils daily. (Patient not taking: Reported on 09/04/2020)   ketoconazole (NIZORAL) 2 % shampoo Apply 1 application topically as needed. 3 times a week   levothyroxine (SYNTHROID) 25 MCG tablet TAKE 1 TABLET BY MOUTH DAILY   lubiprostone (AMITIZA) 24 MCG capsule TAKE ONE CAPSULE TWICE A DAY WITH MEALS   memantine (NAMENDA) 10 MG tablet TAKE ONE TABLET BY MOUTH TWICE DAILY (Patient not taking: Reported on 09/04/2020)   mirabegron ER (MYRBETRIQ) 50 MG TB24 tablet Take 1 tablet (50 mg total) by mouth daily.   Multiple Vitamin (MULTIVITAMIN WITH MINERALS) TABS tablet Take 1 tablet by mouth daily.   omeprazole (PRILOSEC) 40 MG capsule Take 40 mg by mouth daily.    ondansetron (ZOFRAN) 8 MG tablet TAKE ONE TABLET EVERY EIGHT HOURS AS NEEDED FOR NAUSEA / VOMITING   polyethylene glycol (MIRALAX / GLYCOLAX) packet Take 17 g by mouth daily as needed.  (Patient not taking: Reported on 09/04/2020)   venlafaxine XR (EFFEXOR-XR) 150 MG 24 hr capsule TAKE 1 CAPSULE BY MOUTH EVERY DAY   Vitamin D, Ergocalciferol, (DRISDOL) 1.25 MG (50000 UNIT) CAPS capsule Take 50,000 Units by mouth once a week.   No facility-administered encounter medications on file as of 10/03/2020.    Contacted Beuna Bolding [facility] to review adherence to medications.  Patient is > 5 days past due for refill on the following medications per chart history:   Name   Last fill date   DS Atorvastatin 20mg  02/03/2020 90DS   Taking Atorvastatin 20 mg  How many tablets per day?  1 tablet    What time of day? Morning   What concerns do you have about this medication?  The patient has no concerns   How often do you forget or accidentally miss a dose?  on occasion she might miss a dose   Do you use a pillbox? Yes   Are you having any problems getting this medication from your pharmacy? No  - the patient reports she used [facility]    Has the cost of this medication been a  concern? No  When was the last time you filled your prescription?  The patient did not have a bottle near her to verify. She does not have any expired medication (bottles > 10 year old)   Do you have excess medication on hand? No  Counseled patient on:  Importance of taking medication daily without missed doses  Using a pillbox to assist with medication organization   Access to CCM team for any cost, medication, or pharmacy concerns  Patient reports she does not miss any doses and is on time with medication.   Debbora Dus, CPP notified  Avel Sensor, [place] Assistant [phone]  I have reviewed the care management and care coordination activities outlined in this encounter and I am certifying that I agree with the content of this note. No further action required.  Debbora Dus, PharmD Clinical Pharmacist [facility] [phone]

## 2020-10-15 ENCOUNTER — Inpatient Hospital Stay: Payer: Medicare Other

## 2020-10-15 ENCOUNTER — Inpatient Hospital Stay: Payer: Medicare Other | Attending: Oncology | Admitting: Oncology

## 2020-10-15 ENCOUNTER — Encounter: Payer: Self-pay | Admitting: Oncology

## 2020-10-15 VITALS — BP 120/60 | HR 59 | Temp 98.0°F | Resp 18 | Wt 156.3 lb

## 2020-10-15 DIAGNOSIS — I700 Atherosclerosis of aorta: Secondary | ICD-10-CM | POA: Insufficient documentation

## 2020-10-15 DIAGNOSIS — Z882 Allergy status to sulfonamides status: Secondary | ICD-10-CM | POA: Diagnosis not present

## 2020-10-15 DIAGNOSIS — C3490 Malignant neoplasm of unspecified part of unspecified bronchus or lung: Secondary | ICD-10-CM | POA: Diagnosis not present

## 2020-10-15 DIAGNOSIS — C50912 Malignant neoplasm of unspecified site of left female breast: Secondary | ICD-10-CM | POA: Diagnosis not present

## 2020-10-15 DIAGNOSIS — Z5111 Encounter for antineoplastic chemotherapy: Secondary | ICD-10-CM

## 2020-10-15 DIAGNOSIS — I2510 Atherosclerotic heart disease of native coronary artery without angina pectoris: Secondary | ICD-10-CM | POA: Diagnosis not present

## 2020-10-15 DIAGNOSIS — Z79899 Other long term (current) drug therapy: Secondary | ICD-10-CM | POA: Diagnosis not present

## 2020-10-15 DIAGNOSIS — C773 Secondary and unspecified malignant neoplasm of axilla and upper limb lymph nodes: Secondary | ICD-10-CM | POA: Insufficient documentation

## 2020-10-15 DIAGNOSIS — M7989 Other specified soft tissue disorders: Secondary | ICD-10-CM | POA: Diagnosis not present

## 2020-10-15 DIAGNOSIS — Z803 Family history of malignant neoplasm of breast: Secondary | ICD-10-CM | POA: Insufficient documentation

## 2020-10-15 DIAGNOSIS — Z79811 Long term (current) use of aromatase inhibitors: Secondary | ICD-10-CM | POA: Diagnosis not present

## 2020-10-15 DIAGNOSIS — M170 Bilateral primary osteoarthritis of knee: Secondary | ICD-10-CM | POA: Diagnosis not present

## 2020-10-15 DIAGNOSIS — Z8249 Family history of ischemic heart disease and other diseases of the circulatory system: Secondary | ICD-10-CM | POA: Diagnosis not present

## 2020-10-15 DIAGNOSIS — N6315 Unspecified lump in the right breast, overlapping quadrants: Secondary | ICD-10-CM | POA: Diagnosis not present

## 2020-10-15 DIAGNOSIS — C3412 Malignant neoplasm of upper lobe, left bronchus or lung: Secondary | ICD-10-CM | POA: Diagnosis not present

## 2020-10-15 DIAGNOSIS — C3492 Malignant neoplasm of unspecified part of left bronchus or lung: Secondary | ICD-10-CM

## 2020-10-15 DIAGNOSIS — C50812 Malignant neoplasm of overlapping sites of left female breast: Secondary | ICD-10-CM | POA: Insufficient documentation

## 2020-10-15 DIAGNOSIS — M8580 Other specified disorders of bone density and structure, unspecified site: Secondary | ICD-10-CM

## 2020-10-15 DIAGNOSIS — Z833 Family history of diabetes mellitus: Secondary | ICD-10-CM | POA: Insufficient documentation

## 2020-10-15 DIAGNOSIS — N6325 Unspecified lump in the left breast, overlapping quadrants: Secondary | ICD-10-CM | POA: Diagnosis not present

## 2020-10-15 DIAGNOSIS — Z170 Estrogen receptor positive status [ER+]: Secondary | ICD-10-CM | POA: Insufficient documentation

## 2020-10-15 DIAGNOSIS — Z9049 Acquired absence of other specified parts of digestive tract: Secondary | ICD-10-CM | POA: Insufficient documentation

## 2020-10-15 DIAGNOSIS — R600 Localized edema: Secondary | ICD-10-CM | POA: Diagnosis not present

## 2020-10-15 LAB — CBC WITH DIFFERENTIAL/PLATELET
Abs Immature Granulocytes: 0.06 10*3/uL (ref 0.00–0.07)
Basophils Absolute: 0.1 10*3/uL (ref 0.0–0.1)
Basophils Relative: 1 %
Eosinophils Absolute: 0.8 10*3/uL — ABNORMAL HIGH (ref 0.0–0.5)
Eosinophils Relative: 10 %
HCT: 37.3 % (ref 36.0–46.0)
Hemoglobin: 12.3 g/dL (ref 12.0–15.0)
Immature Granulocytes: 1 %
Lymphocytes Relative: 20 %
Lymphs Abs: 1.7 10*3/uL (ref 0.7–4.0)
MCH: 31.7 pg (ref 26.0–34.0)
MCHC: 33 g/dL (ref 30.0–36.0)
MCV: 96.1 fL (ref 80.0–100.0)
Monocytes Absolute: 0.9 10*3/uL (ref 0.1–1.0)
Monocytes Relative: 10 %
Neutro Abs: 5.1 10*3/uL (ref 1.7–7.7)
Neutrophils Relative %: 58 %
Platelets: 247 10*3/uL (ref 150–400)
RBC: 3.88 MIL/uL (ref 3.87–5.11)
RDW: 13.1 % (ref 11.5–15.5)
WBC: 8.6 10*3/uL (ref 4.0–10.5)
nRBC: 0 % (ref 0.0–0.2)

## 2020-10-15 LAB — COMPREHENSIVE METABOLIC PANEL
ALT: 18 U/L (ref 0–44)
AST: 22 U/L (ref 15–41)
Albumin: 3.8 g/dL (ref 3.5–5.0)
Alkaline Phosphatase: 79 U/L (ref 38–126)
Anion gap: 10 (ref 5–15)
BUN: 27 mg/dL — ABNORMAL HIGH (ref 8–23)
CO2: 25 mmol/L (ref 22–32)
Calcium: 8.5 mg/dL — ABNORMAL LOW (ref 8.9–10.3)
Chloride: 102 mmol/L (ref 98–111)
Creatinine, Ser: 1.16 mg/dL — ABNORMAL HIGH (ref 0.44–1.00)
GFR, Estimated: 47 mL/min — ABNORMAL LOW (ref >60)
Glucose, Bld: 129 mg/dL — ABNORMAL HIGH (ref 70–99)
Potassium: 4.1 mmol/L (ref 3.5–5.1)
Sodium: 137 mmol/L (ref 135–145)
Total Bilirubin: 0.7 mg/dL (ref 0.3–1.2)
Total Protein: 6.6 g/dL (ref 6.5–8.1)

## 2020-10-15 NOTE — Progress Notes (Signed)
Hematology/Oncology follow up note [facility] [phone] [phone]   Patient Care Team: [name], MD as PCP - General (Family Medicine) Pyrtle, Lajuan Lines, MD as Consulting Physician (Gastroenterology) Telford Nab, RN as Registered Nurse Earlie Server, MD as Consulting Physician (Hematology and Oncology) Debbora Dus, [facility] as Pharmacist (Pharmacist) Burnice Logan, [facility] as Pharmacist (Pharmacist) Ralene Bathe, MD (Dermatology) Festus Aloe, MD as Consulting Physician (Urology)   REASON FOR VISIT:  Follow up for management of lung cancer and breast cancer  HISTORY OF PRESENTING ILLNESS:  Jamie Matthews is a  80 y.o.  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 05/05/2018 bilateral diagnostic breast mammogram showed suspicious mass 1.1cm  in the 12:00 retroareolar region of the left breast and the left axillary lymph node. 06/08/2018 patient status post a left breast retroareolar and left axillary lymph node biopsy. Pathology showed invasive mammary carcinoma, no special type, grade 1, left axillary lymph node positive for invasive mammary carcinoma clinically metastatic.  Background lymph node architecture is not identified. ER> 90% PR> 90%, HER-2 negative.  PET scan done which unfortunately showed additional hypermetabolic bilateral hilar lymph nodes as well as left upper lobe lung mass which may represent a focus of metastatic disease from breast, or primary lung neoplasm.  There is multiple additional small pulmonary nodules are scattered throughout both lungs which are worrisome for metastasis.  Index nodule within the medial right upper lobe measures 7 mm,, peri-broncho-vascular nodule in the right lower lobe measures 1.2 cm.  # Stage IV lung cancer-  Biopsy of lung mass left upper lobe showed non-small cell lung cancer, favor adenocarcinoma.  #NGS came back patient has PD L1  is 70% TPS, MET fusion mutation.  #Mid-March 2020 started on crizotinib  Bilateral lower extremity edema, right >left, Ultrasound venous right 09/20/2018 no DVT.  2D echo 10/10/2018 showed LVEF 55 to 60% Most likely secondary to crizotinib side effects.  # 12/15/2018 CT chest images were independently reviewed and discussed with patient. Dominant left upper lobe nodule and multiple scattered irregular pulmonary parenchymal nodular lesions are stable. Mild basilar pulmonary parenchymal septal thickening is new and can be seen with pulmonary edema. Patient reports no shortness of breath asymptomatic  #12/29/2018 unilateral left diagnostic mammogram with ultrasound images were independently reviewed and discussed with patient. Slight interval reduction of the size of the left retroareolar breast mass, 9 x 6 x 7 mm, comparing to 10 x 9 x 8 mm. Axillary lymph node measured 1.3 x 1.0 x 1.0 cm, previously 2.2 x 1.4 x 1.5 cm,  there were 2 other abdominal lymph nodes in the left axilla, which were not reported in previous ultrasound. Discussed with patient that overall, breast cancer responded to endocrine treatments.  Additional 2 lymph nodes need to be closely monitored.  Recommend patient to continue Arimidex.   INTERVAL HISTORY Jamie Matthews is a 80 y.o. female who has above history reviewed by me today presents fornon-small cell lung cancer and breast cancer management. Patient reports no new complaints.  She is taking crizotinib.  She is not sure if she is taking Arimidex or calcium  Chronic lower extremity swelling. Patient walks with a walker.  No recent falls  Review of Systems  Constitutional: Negative for appetite change, chills, fatigue and fever.  HENT:   Negative for hearing loss and voice change.   Eyes: Negative for eye problems.  Respiratory: Negative for chest tightness  and cough.   Cardiovascular: Positive for leg swelling. Negative for chest pain.   Gastrointestinal: Negative for abdominal distention, abdominal pain, blood in stool, constipation and nausea.  Endocrine: Negative for hot flashes.  Genitourinary: Negative for difficulty urinating and frequency.   Musculoskeletal: Negative for arthralgias.  Skin: Negative for itching and rash.  Neurological: Negative for extremity weakness and light-headedness.  Hematological: Negative for adenopathy.  Psychiatric/Behavioral: Negative for confusion and sleep disturbance.    MEDICAL HISTORY:  Past Medical History:  Diagnosis Date  . AKI (acute kidney injury) ([place]) 08/17/2018  . Anxiety   . Arthritis   . Belching   . Bladder disorder    OVERACTIVE  . Bowel dysfunction    BLOCKAGE  . Cancer (HCC)    breast  . Constipation   . Depression   . Diverticulitis   . Fibromyalgia   . GERD (gastroesophageal reflux disease)   . Hyperlipidemia   . IBS (irritable bowel syndrome)   . Internal hemorrhoids   . Lung cancer ([place]) 2020  . Memory deficits   . Murmur    asymptomatic  . Pneumonia 11/18/12  . Urinary incontinence   . Vertigo     SURGICAL HISTORY: Past Surgical History:  Procedure Laterality Date  . AXILLARY LYMPH NODE BIOPSY Left 06/08/2018   INVASIVE MAMMARY CARCINOMA  . BLADDER SUSPENSION  2004, 2012  . BREAST BIOPSY Left 06/08/2018   INVASIVE MAMMARY CARCINOMA  . CATARACT EXTRACTION W/PHACO Right 08/27/2015   Procedure: CATARACT EXTRACTION PHACO AND INTRAOCULAR LENS PLACEMENT (IOC);  Surgeon: Estill Cotta, MD;  Location: [facility] ORS;  Service: Ophthalmology;  Laterality: Right;  [place]   1:00.2 AP%  22.5 CDE  23.67 fluid casette lot #[id number]  exp05/31/2018  . CATARACT EXTRACTION W/PHACO Left 10/15/2015   Procedure: CATARACT EXTRACTION PHACO AND INTRAOCULAR LENS PLACEMENT (IOC);  Surgeon: Estill Cotta, MD;  Location: [facility] ORS;  Service: Ophthalmology;  Laterality: Left;  [place] 01:07 AP% 18.1 CDE 21.57 fluid pack lot # [id number]  . CHOLECYSTECTOMY    . COLONOSCOPY   2017  . ELECTROMAGNETIC NAVIGATION BROCHOSCOPY N/A 07/09/2018   Procedure: ELECTROMAGNETIC NAVIGATION BRONCHOSCOPY;  Surgeon: Flora Lipps, MD;  Location: [facility] ORS;  Service: Cardiopulmonary;  Laterality: N/A;  . TONSILLECTOMY  1947    SOCIAL HISTORY: Social History   Socioeconomic History  . Marital status: Married    Spouse name: Bennye Alm  . Number of children: 0  . Years of education: Master's Degree  . Highest education level: Not on file  Occupational History  . Occupation: Retired  Tobacco Use  . Smoking status: Never Smoker  . Smokeless tobacco: Never Used  Vaping Use  . Vaping Use: Never used  Substance and Sexual Activity  . Alcohol use: Not Currently    Comment: rare wine  . Drug use: No  . Sexual activity: Not Currently  Other Topics Concern  . Not on file  Social History Narrative   Recently moved with her husband to [place] from [place].   Husband is a retired Pharmacist, community.   No children.   She is a retired Pharmacist, hospital.   Enjoys: Chief Technology Officer, reading - mysteries and biographies, cooking   Exercise: walking, gardening   Diet: low appetite due to cancer treatment, grazing   She is a DNR.   Social Determinants of Health   Financial Resource Strain: Low Risk   . Difficulty of Paying Living Expenses: Not hard at all  Food Insecurity: No Food Insecurity  . Worried About Charity fundraiser  in the Last Year: Never true  . Ran Out of Food in the Last Year: Never true  Transportation Needs: No Transportation Needs  . Lack of Transportation (Medical): No  . Lack of Transportation (Non-Medical): No  Physical Activity: Inactive  . Days of Exercise per Week: 0 days  . Minutes of Exercise per Session: 0 min  Stress: No Stress Concern Present  . Feeling of Stress : Not at all  Social Connections: Not on file  Intimate Partner Violence: Not At Risk  . Fear of Current or Ex-Partner: No  . Emotionally Abused: No  . Physically Abused: No  . Sexually Abused: No     FAMILY HISTORY: Family History  Problem Relation Age of Onset  . Diabetes Father   . Heart disease Father   . Lymphoma Father   . Heart disease Mother   . Breast cancer Sister 5  . Colon cancer Neg Hx   . Esophageal cancer Neg Hx   . Rectal cancer Neg Hx   . Stomach cancer Neg Hx   . Bladder Cancer Neg Hx   . Kidney cancer Neg Hx     ALLERGIES:  is allergic to sulfa antibiotics.  MEDICATIONS:  Current Outpatient Medications  Medication Sig Dispense Refill  . acetaminophen (TYLENOL) 325 MG tablet Take 325 mg by mouth every 6 (six) hours as needed for mild pain.     [name] anastrozole (ARIMIDEX) 1 MG tablet Take 1 tablet (1 mg total) by mouth daily. 90 tablet 1  . atorvastatin (LIPITOR) 20 MG tablet TAKE 1 TABLET BY MOUTH NIGHTLY 90 tablet 0  . benzonatate (TESSALON PERLES) 100 MG capsule Take 1 capsule (100 mg total) by mouth 3 (three) times daily as needed for cough. (Patient not taking: Reported on 09/04/2020) 30 capsule 0  . buPROPion (WELLBUTRIN XL) 150 MG 24 hr tablet TAKE ONE TABLET BY MOUTH EVERY DAY (Patient taking differently: Take 150 mg by mouth daily.) 90 tablet 2  . Calcium-Magnesium-Vitamin D (CALCIUM 1200+D3 PO) Take 1 tablet by mouth daily.    . Calcium-Vitamin D 600-200 MG-UNIT tablet Take 2 tablets by mouth daily. 60 tablet 0  . crizotinib (XALKORI) 250 MG capsule TAKE 1 CAPSULE (250 MG TOTAL) BY MOUTH 2 TIMES DAILY. 60 capsule 2  . desonide (DESOWEN) 0.05 % lotion Apply 1 application topically as needed (after showering).    . Dextromethorphan-guaiFENesin (MUCINEX DM) 30-600 MG TB12 Take 1 tablet by mouth 2 (two) times daily as needed (for congestion/cough). (Patient not taking: Reported on 09/04/2020)    . docusate sodium (COLACE) 100 MG capsule Take 1 capsule (100 mg total) by mouth daily. (Patient not taking: Reported on 09/04/2020) 90 capsule 0  . fluticasone (FLONASE) 50 MCG/ACT nasal spray Place 2 sprays into both nostrils daily. (Patient not taking: Reported on  09/04/2020) 16 g 6  . ketoconazole (NIZORAL) 2 % shampoo Apply 1 application topically as needed. 3 times a week    . levothyroxine (SYNTHROID) 25 MCG tablet TAKE 1 TABLET BY MOUTH DAILY 90 tablet 3  . lubiprostone (AMITIZA) 24 MCG capsule TAKE ONE CAPSULE TWICE A DAY WITH MEALS 60 capsule 3  . memantine (NAMENDA) 10 MG tablet TAKE ONE TABLET BY MOUTH TWICE DAILY (Patient not taking: Reported on 09/04/2020) 180 tablet 2  . mirabegron ER (MYRBETRIQ) 50 MG TB24 tablet Take 1 tablet (50 mg total) by mouth daily. 90 tablet 1  . Multiple Vitamin (MULTIVITAMIN WITH MINERALS) TABS tablet Take 1 tablet by mouth daily.    [name]  omeprazole (PRILOSEC) 40 MG capsule Take 40 mg by mouth daily.     . ondansetron (ZOFRAN) 8 MG tablet TAKE ONE TABLET EVERY EIGHT HOURS AS NEEDED FOR NAUSEA / VOMITING 60 tablet 1  . polyethylene glycol (MIRALAX / GLYCOLAX) packet Take 17 g by mouth daily as needed.  (Patient not taking: Reported on 09/04/2020)    . venlafaxine XR (EFFEXOR-XR) 150 MG 24 hr capsule TAKE 1 CAPSULE BY MOUTH EVERY DAY 90 capsule 1  . Vitamin D, Ergocalciferol, (DRISDOL) 1.25 MG (50000 UNIT) CAPS capsule Take 50,000 Units by mouth once a week.     No current facility-administered medications for this visit.     PHYSICAL EXAMINATION: ECOG PERFORMANCE STATUS: 2 - Symptomatic, <50% confined to bed Vitals:   10/15/20 1357  BP: 120/60  Pulse: (!) 59  Resp: 18  Temp: 98 F (36.7 C)   Filed Weights   10/15/20 1357  Weight: 156 lb 4.8 oz (70.9 kg)    Physical Exam Constitutional:      General: She is not in acute distress.    Appearance: She is not diaphoretic.     Comments: Frail female, walks with a walker  HENT:     Head: Normocephalic and atraumatic.     Nose: Nose normal.     Mouth/Throat:     Pharynx: No oropharyngeal exudate.  Eyes:     General: No scleral icterus.    Pupils: Pupils are equal, round, and reactive to light.  Cardiovascular:     Rate and Rhythm: Normal rate and regular  rhythm.     Heart sounds: No murmur heard.   Pulmonary:     Effort: Pulmonary effort is normal. No respiratory distress.     Breath sounds: No wheezing or rales.  Chest:     Chest wall: No tenderness.  Abdominal:     General: There is no distension.     Palpations: Abdomen is soft.     Tenderness: There is no abdominal tenderness.  Musculoskeletal:        General: Normal range of motion.     Cervical back: Normal range of motion and neck supple.     Comments: Bilateral lower extremity 2+ edema  Skin:    General: Skin is warm and dry.     Findings: No erythema.  Neurological:     Mental Status: She is alert and oriented to person, place, and time.     Cranial Nerves: No cranial nerve deficit.     Motor: No abnormal muscle tone.     Coordination: Coordination normal.  Psychiatric:        Mood and Affect: Affect normal.      CMP Latest Ref Rng & Units 09/04/2020  Glucose 70 - 99 mg/dL 136(H)  BUN 8 - 23 mg/dL 22  Creatinine 0.44 - 1.00 mg/dL 0.94  Sodium 135 - 145 mmol/L 140  Potassium 3.5 - 5.1 mmol/L 4.1  Chloride 98 - 111 mmol/L 106  CO2 22 - 32 mmol/L 27  Calcium 8.9 - 10.3 mg/dL 8.0(L)  Total Protein 6.5 - 8.1 g/dL 6.2(L)  Total Bilirubin 0.3 - 1.2 mg/dL 0.6  Alkaline Phos 38 - 126 U/L 65  AST 15 - 41 U/L 22  ALT 0 - 44 U/L 20   CBC Latest Ref Rng & Units 09/04/2020  WBC 4.0 - 10.5 K/uL 5.8  Hemoglobin 12.0 - 15.0 g/dL 12.0  Hematocrit 36.0 - 46.0 % 36.0  Platelets 150 - 400 K/uL 216  LABORATORY DATA:  I have reviewed the data as listed Lab Results  Component Value Date   WBC 5.8 09/04/2020   HGB 12.0 09/04/2020   HCT 36.0 09/04/2020   MCV 97.6 09/04/2020   PLT 216 09/04/2020   Recent Labs    12/01/19 1316 12/05/19 1200 01/02/20 0832 01/31/20 0917 04/04/20 1243 06/25/20 1418 08/07/20 1237 09/04/20 1317  NA 134*   < > 142 140   < > 135 141 140  K 4.2   < > 5.0 4.1   < > 4.8 5.1 4.1  CL 100   < > 105 107   < > 101 108 106  CO2 27   < > 29 26    < > '28 25 27  ' GLUCOSE 109*   < > 132* 102*   < > 153* 89 136*  BUN 33*   < > 19 23   < > 22 24* 22  CREATININE 1.23*   < > 0.95 1.13*   < > 0.97 1.02* 0.94  CALCIUM 7.2*   < > 8.7* 7.5*   < > 8.2* 7.5* 8.0*  GFRNONAA 41*  --  56* 46*   < > 59* 55* >60  GFRAA 48*  --  >60 53*  --   --   --   --   PROT  --   --  6.5 5.8*   < > 6.2* 6.2* 6.2*  ALBUMIN  --   --  3.5 3.3*   < > 3.4* 3.7 3.7  AST  --   --  25 25   < > '18 23 22  ' ALT  --   --  24 25   < > '14 17 20  ' ALKPHOS  --   --  92 82   < > 73 76 65  BILITOT  --   --  0.7 0.7   < > 0.5 0.8 0.6   < > = values in this interval not displayed.   Iron/TIBC/Ferritin/ %Sat No results found for: IRON, TIBC, FERRITIN, IRONPCTSAT   RADIOGRAPHIC STUDIES: I have personally reviewed the radiological images as listed and agreed with the findings in the report. DG Knee 4 Views W/Patella Right  Result Date: 08/18/2020 CLINICAL DATA:  Chronic right knee pain. EXAM: RIGHT KNEE - COMPLETE 4+ VIEW COMPARISON:  None. FINDINGS: Right knee: No fracture or dislocation. Tricompartmental degenerative changes are seen, most marked in the lateral compartment with loss of joint space. No fracture, dislocation, or joint effusion. A single anterior view of the left knee demonstrates degenerative changes in the medial and lateral compartments with loss of joint space in the lateral compartment on weight-bearing views. IMPRESSION: 1. 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 2. A single anterior view of the left knee demonstrates degenerative changes well with significant loss of joint space laterally on weight-bearing views. Electronically Signed   By: Dorise Bullion III M.D   On: 08/18/2020 10:40   CT CHEST ABDOMEN PELVIS WO CONTRAST  Result Date: 09/21/2020 CLINICAL DATA:  Metastatic left lung cancer, ongoing chemotherapy, history of left breast cancer EXAM: CT CHEST,  ABDOMEN AND PELVIS WITHOUT CONTRAST TECHNIQUE: Multidetector CT imaging of the chest, abdomen and pelvis was performed following the standard protocol without IV contrast. COMPARISON:  CT chest, 05/07/2020, CT abdomen pelvis, 01/26/2020 FINDINGS: CT CHEST FINDINGS Cardiovascular: Aortic atherosclerosis. Normal heart size. Left coronary artery calcifications. No pericardial effusion. Mediastinum/Nodes:  No enlarged mediastinal, hilar, or axillary lymph nodes. Thyroid gland, trachea, and esophagus demonstrate no significant findings. Lungs/Pleura: Stable, faintly calcified mass of the anterior left pulmonary apex measuring 2.3 x 1.7 cm (series 4, image 29). Occasional small pulmonary nodules are stable, for example a 5 mm nodule of the right middle lobe (series 4, image 72). No pleural effusion or pneumothorax. Musculoskeletal: No chest wall mass or suspicious bone lesions identified. Stable inferior endplate wedge deformity of the T9 vertebral body and superior endplate wedge deformity of the T11 vertebral body (series 6, image 88). CT ABDOMEN PELVIS FINDINGS Hepatobiliary: No focal liver abnormality is seen. Status post cholecystectomy. No biliary dilatation. Pancreas: Unremarkable. No pancreatic ductal dilatation or surrounding inflammatory changes. Spleen: Normal in size without significant abnormality. Adrenals/Urinary Tract: Adrenal glands are unremarkable. Kidneys are normal, without renal calculi, solid lesion, or hydronephrosis. Bladder is unremarkable. Stomach/Bowel: Stomach is within normal limits. Appendix appears normal. No evidence of bowel wall thickening, distention, or inflammatory changes. Large burden of stool throughout the colon and rectum. Vascular/Lymphatic: Aortic atherosclerosis. No enlarged abdominal or pelvic lymph nodes. Reproductive: No mass or other abnormality. Other: No abdominal wall hernia or abnormality. No abdominopelvic ascites. Musculoskeletal: No acute or significant osseous  findings. Nonacute, callused fractures of the right pubic symphysis and inferior pubic ramus. IMPRESSION: 1. Stable, faintly calcified mass of the anterior left pulmonary apex measuring 2.3 x 1.7 cm. 2. Occasional small pulmonary nodules are stable and remain nonspecific, including a previously new 3 mm nodule of the right lower lobe. Attention on follow-up. 3. No noncontrast evidence of metastatic disease in the abdomen or pelvis. 4. Coronary artery disease. Aortic Atherosclerosis (ICD10-I70.0). Electronically Signed   By: Eddie Candle M.D.   On: 09/21/2020 18:00      ASSESSMENT & PLAN:  1. Encounter for antineoplastic chemotherapy   2. Primary adenocarcinoma of lung, unspecified laterality ([place])   3. Malignant neoplasm of left female breast, unspecified estrogen receptor status, unspecified site of breast ([place])   4. Aromatase inhibitor use   5. Hypocalcemia   Cancer Staging Malignant neoplasm of left female breast [facility]) Staging form: Breast, AJCC 8th Edition - Clinical stage from 12/17/2018: Stage IB (cT1b, cN1, cM0, G1, ER+, PR+, HER2-) - Signed by Earlie Server, MD on 12/17/2018  Primary adenocarcinoma of lung [facility]) Staging form: Lung, AJCC 8th Edition - Clinical stage from 09/28/2018: Stage IVA (cT2, cN3, cM1a) - Signed by Earlie Server, MD on 09/28/2018   Patient has 2 primaries.   Stage IV lung adenocarcinoma, met fusion mutation cT2 N3 M1a PDL 1 TPS more than 70%  Labs reviewed and discussed with patient Clinically doing well Continue crizotinib.  09/21/2020 CT chest abdomen pelvis reviewed stable disease, anterior left pulmonary apex 2.3 x 1.7 cm unchanged.  Small pulmonary nodules are stable and remain nonspecific. I will obtain MRI brain for surveillance  Breast Cancer,  Recommend patient to continue Arimidex.  I wrote down the medication name and patient will check her pillbox to see if she is taking it.  #hypocalcemia, Calcium level 8.5.   Patient is not sure if she is taking calcium  supplementation.  I wrote down a note for her and she is going to check. Calcium level is better than previous.  I will hold Xgeva today and resume at the next visit if calcium level is stable.  Follow-up in 6 weeks. All questions were answered. The patient knows to call the clinic with any problems questions or concerns.  Earlie Server, MD,  PhD 10/15/2020

## 2020-10-15 NOTE — Progress Notes (Signed)
Pt here for follow up. Pt reports increased constipation.

## 2020-10-16 ENCOUNTER — Other Ambulatory Visit: Payer: Self-pay | Admitting: Oncology

## 2020-10-18 ENCOUNTER — Other Ambulatory Visit: Payer: Self-pay

## 2020-10-18 NOTE — Telephone Encounter (Signed)
Last refill: 09/29/18 #180 with 2  Last OV: 01/19/20  No future appts scheduled

## 2020-10-19 ENCOUNTER — Other Ambulatory Visit: Payer: Self-pay | Admitting: Oncology

## 2020-10-19 MED ORDER — MEMANTINE HCL 10 MG PO TABS: 10.0000 mg | ORAL_TABLET | Freq: Two times a day (BID) | ORAL | 2 refills | Status: DC

## 2020-10-24 ENCOUNTER — Ambulatory Visit
Admission: RE | Admit: 2020-10-24 | Discharge: 2020-10-24 | Disposition: A | Discharge status: Home / Self Care | Payer: Medicare Other | Source: Ambulatory Visit | Attending: Oncology | Admitting: Oncology

## 2020-10-24 ENCOUNTER — Other Ambulatory Visit: Payer: Self-pay

## 2020-10-24 DIAGNOSIS — C3490 Malignant neoplasm of unspecified part of unspecified bronchus or lung: Secondary | ICD-10-CM | POA: Diagnosis not present

## 2020-10-24 DIAGNOSIS — G319 Degenerative disease of nervous system, unspecified: Secondary | ICD-10-CM | POA: Diagnosis not present

## 2020-10-24 MED ORDER — GADOBUTROL 1 MMOL/ML IV SOLN
7.0000 mL | Freq: Once | INTRAVENOUS | Status: AC | PRN
  Administered 2020-10-24: 7 mL via INTRAVENOUS

## 2020-10-25 ENCOUNTER — Encounter: Payer: Self-pay | Admitting: Family Medicine

## 2020-10-25 ENCOUNTER — Other Ambulatory Visit: Payer: Self-pay

## 2020-10-25 ENCOUNTER — Ambulatory Visit (INDEPENDENT_AMBULATORY_CARE_PROVIDER_SITE_OTHER): Payer: Medicare Other | Admitting: Family Medicine

## 2020-10-25 VITALS — BP 102/62 | HR 56 | Temp 97.3°F | Ht 63.0 in | Wt 156.0 lb

## 2020-10-25 DIAGNOSIS — R110 Nausea: Secondary | ICD-10-CM

## 2020-10-25 DIAGNOSIS — Z20822 Contact with and (suspected) exposure to covid-19: Secondary | ICD-10-CM

## 2020-10-25 DIAGNOSIS — R0989 Other specified symptoms and signs involving the circulatory and respiratory systems: Secondary | ICD-10-CM

## 2020-10-25 DIAGNOSIS — R059 Cough, unspecified: Secondary | ICD-10-CM

## 2020-10-25 LAB — POC INFLUENZA A&B (BINAX/QUICKVUE)
Influenza A, POC: NEGATIVE
Influenza B, POC: NEGATIVE

## 2020-10-25 NOTE — Progress Notes (Signed)
Ahman Dugdale T. Kimberl Vig, MD, [facility]  Primary Care and Sports Medicine [facility] at [facility] [place], [address]  Phone: [phone]  FAX: [phone]  Cletus Mehlhoff - 80 y.o. female  MRN [id number]  Date of Birth: 1940-11-15  Date: 10/25/2020  PCP: [name], MD  Referral: [name], MD  Chief Complaint  Patient presents with  . Itching All Over    Started about 3 days ago. No new foods, soaps, or medications.  . Cough    Started 2 days ago. Covid vaccinated but not boosted.    This visit occurred during the SARS-CoV-2 public health emergency.  Safety protocols were in place, including screening questions prior to the visit, additional usage of staff PPE, and extensive cleaning of exam room while observing appropriate contact time as indicated for disinfecting solutions.   Subjective:   Valleri Hendricksen is a 80 y.o. very pleasant female patient with Body mass index is 27.63 kg/m. who presents with the following:  Patient presents with 2 days of coughing, nasal congestion, nauseousness.  She also has had some constipation.  She is immunocompromised, she is currently taking oral medication for lung cancer.  She has had 2 COVID vaccinations, but she has not received her third vaccination.  Zofran has helped in the past with nausea, and she currently has some.  Coughing and has a runny. No HA or sore throat Stomach feels like she might be constipation.  No neurological changes.  Coughing started a could of days ago and stomach started this AM.  A few weeks ago with the stomach.  She does not have any known direct influenza or COVID exposures.  Immunization History  Administered Date(s) Administered  . Fluad Quad(high Dose 65+) 02/01/2019  . Influenza Split 02/05/2012  . Influenza, High Dose Seasonal PF 03/23/2018  . Influenza,inj,Quad PF,6+ Mos 02/15/2015, 02/24/2017  .  Influenza-Unspecified 02/23/2013  . Moderna Sars-Covid-2 Vaccination 08/05/2019, 09/17/2019  . Pneumococcal Conjugate-13 12/13/2013  . Pneumococcal Polysaccharide-23 07/18/2015  . Tdap 08/17/2019     Review of Systems is noted in the HPI, as appropriate  Objective:   BP 102/62 (BP Location: Right Arm, Patient Position: Sitting, Cuff Size: Normal)   Pulse (!) 56   Temp (!) 97.3 F (36.3 C)   Ht 5\' 3"  (1.6 m)   Wt 156 lb (70.8 kg)   SpO2 92%   BMI 27.63 kg/m   GEN: No acute distress; alert,appropriate. PULM: Breathing comfortably in no respiratory distress PSYCH: Normally interactive.  CV: RRR, no m/g/r  PULM: Normal respiratory rate, no accessory muscle use. No wheezes, crackles or rhonchi   Laboratory and Imaging Data: Results for orders placed or performed in visit on 10/25/20  POC Influenza A&B(BINAX/QUICKVUE)  Result Value Ref Range   Influenza A, POC Negative Negative   Influenza B, POC Negative Negative     Assessment and Plan:     ICD-10-CM   1. Suspected COVID-19 virus infection  Z20.822 Novel Coronavirus, NAA ([facility])  2. Cough  R05.9 Novel Coronavirus, NAA ([facility])    POC Influenza A&B(BINAX/QUICKVUE)  3. Runny nose  R09.89 Novel Coronavirus, NAA ([facility])    POC Influenza A&B(BINAX/QUICKVUE)  4. Nausea  R11.0 Novel Coronavirus, NAA ([facility])    POC Influenza A&B(BINAX/QUICKVUE)   Likely COVID-19.  Check COVID-19 status today, check acute influenza.  Even if COVID-19 initial test is negative, recheck after 3 additional days for a total of 5 days  after onset of symptoms.  If she does have COVID-19, the patient is very high risk and should be treated with antiviral medication.  If influenza positive, treat with Tamiflu.  Chart review, recent lab review, COVID and influenza testing.  No orders of the defined types were placed in this encounter.  There are no discontinued medications. Orders Placed This Encounter  Procedures  . Novel Coronavirus, NAA  ([facility])  . POC Influenza A&B(BINAX/QUICKVUE)    Follow-up: No follow-ups on file.  Signed,  Maud Deed. Dae Antonucci, MD   Outpatient Encounter Medications as of 10/25/2020  Medication Sig  . acetaminophen (TYLENOL) 325 MG tablet Take 325 mg by mouth every 6 (six) hours as needed for mild pain.   [name] anastrozole (ARIMIDEX) 1 MG tablet Take 1 tablet (1 mg total) by mouth daily.  [name] atorvastatin (LIPITOR) 20 MG tablet TAKE 1 TABLET BY MOUTH NIGHTLY  . crizotinib (XALKORI) 250 MG capsule TAKE 1 CAPSULE (250 MG TOTAL) BY MOUTH 2 TIMES DAILY.  [name] desonide (DESOWEN) 0.05 % lotion Apply 1 application topically as needed (after showering).  [name] docusate sodium (COLACE) 100 MG capsule TAKE 1 CAPSULE BY MOUTH DAILY  . levothyroxine (SYNTHROID) 25 MCG tablet TAKE 1 TABLET BY MOUTH DAILY  . memantine (NAMENDA) 10 MG tablet Take 1 tablet (10 mg total) by mouth 2 (two) times daily.  . mirabegron ER (MYRBETRIQ) 50 MG TB24 tablet Take 1 tablet (50 mg total) by mouth daily.  [name] venlafaxine XR (EFFEXOR-XR) 150 MG 24 hr capsule TAKE 1 CAPSULE BY MOUTH EVERY DAY  . benzonatate (TESSALON PERLES) 100 MG capsule Take 1 capsule (100 mg total) by mouth 3 (three) times daily as needed for cough. (Patient not taking: No sig reported)  . buPROPion (WELLBUTRIN XL) 150 MG 24 hr tablet TAKE ONE TABLET BY MOUTH EVERY DAY (Patient not taking: Reported on 10/25/2020)  . Calcium-Magnesium-Vitamin D (CALCIUM 1200+D3 PO) Take 1 tablet by mouth daily. (Patient not taking: Reported on 10/25/2020)  . Calcium-Vitamin D 600-200 MG-UNIT tablet Take 2 tablets by mouth daily. (Patient not taking: Reported on 10/25/2020)  . Dextromethorphan-guaiFENesin (MUCINEX DM) 30-600 MG TB12 Take 1 tablet by mouth 2 (two) times daily as needed (for congestion/cough). (Patient not taking: No sig reported)  . fluticasone (FLONASE) 50 MCG/ACT nasal spray Place 2 sprays into both nostrils daily. (Patient not taking: No sig reported)  . ketoconazole (NIZORAL) 2 %  shampoo Apply 1 application topically as needed. 3 times a week (Patient not taking: Reported on 10/25/2020)  . lubiprostone (AMITIZA) 24 MCG capsule TAKE ONE CAPSULE TWICE A DAY WITH MEALS (Patient not taking: Reported on 10/25/2020)  . Multiple Vitamin (MULTIVITAMIN WITH MINERALS) TABS tablet Take 1 tablet by mouth daily. (Patient not taking: Reported on 10/25/2020)  . omeprazole (PRILOSEC) 40 MG capsule Take 40 mg by mouth daily.  (Patient not taking: Reported on 10/25/2020)  . ondansetron (ZOFRAN) 8 MG tablet TAKE ONE TABLET EVERY EIGHT HOURS AS NEEDED FOR NAUSEA / VOMITING (Patient not taking: Reported on 10/25/2020)  . polyethylene glycol (MIRALAX / GLYCOLAX) packet Take 17 g by mouth daily as needed. (Patient not taking: Reported on 10/25/2020)  . Vitamin D, Ergocalciferol, (DRISDOL) 1.25 MG (50000 UNIT) CAPS capsule Take 50,000 Units by mouth once a week. (Patient not taking: Reported on 10/25/2020)   No facility-administered encounter medications on file as of 10/25/2020.

## 2020-10-26 ENCOUNTER — Telehealth: Payer: Self-pay | Admitting: Family Medicine

## 2020-10-26 LAB — NOVEL CORONAVIRUS, NAA: SARS-CoV-2, NAA: NOT DETECTED

## 2020-10-26 LAB — SARS-COV-2, NAA 2 DAY TAT

## 2020-10-26 NOTE — Telephone Encounter (Signed)
Sent to [facility] on accident. This is for Dr.Cody

## 2020-10-26 NOTE — Telephone Encounter (Signed)
twin Jamie Matthews dropped off paper work for Dr. Lorelei Pont to sign. Put in providers folder at front office

## 2020-10-29 NOTE — Telephone Encounter (Signed)
Paperwork signed and faxed back to number provided.

## 2020-10-30 ENCOUNTER — Other Ambulatory Visit: Payer: Self-pay | Admitting: Family Medicine

## 2020-10-30 ENCOUNTER — Other Ambulatory Visit (HOSPITAL_COMMUNITY): Payer: Self-pay

## 2020-10-30 DIAGNOSIS — N3281 Overactive bladder: Secondary | ICD-10-CM

## 2020-10-30 NOTE — Telephone Encounter (Signed)
Please check to see if pt is still taking/needing this medication given so much time since last refill

## 2020-11-01 ENCOUNTER — Other Ambulatory Visit (HOSPITAL_COMMUNITY): Payer: Self-pay

## 2020-11-01 NOTE — Telephone Encounter (Signed)
Pt states that she does still take this medication. States that she must have had extra to hold her over till now.  Ok to fill? Confirmed that [facility] is still her pharmacy.

## 2020-11-07 ENCOUNTER — Ambulatory Visit (INDEPENDENT_AMBULATORY_CARE_PROVIDER_SITE_OTHER): Payer: Medicare Other | Admitting: Family Medicine

## 2020-11-07 ENCOUNTER — Encounter: Payer: Self-pay | Admitting: Family Medicine

## 2020-11-07 ENCOUNTER — Other Ambulatory Visit: Payer: Self-pay

## 2020-11-07 VITALS — BP 110/60 | HR 58 | Temp 97.7°F | Ht 63.0 in | Wt 152.0 lb

## 2020-11-07 DIAGNOSIS — C3490 Malignant neoplasm of unspecified part of unspecified bronchus or lung: Secondary | ICD-10-CM | POA: Diagnosis not present

## 2020-11-07 DIAGNOSIS — J189 Pneumonia, unspecified organism: Secondary | ICD-10-CM | POA: Diagnosis not present

## 2020-11-07 DIAGNOSIS — D849 Immunodeficiency, unspecified: Secondary | ICD-10-CM

## 2020-11-07 MED ORDER — LEVOFLOXACIN 500 MG PO TABS: 500.0000 mg | ORAL_TABLET | Freq: Every day | ORAL | 0 refills | Status: AC

## 2020-11-07 NOTE — Progress Notes (Signed)
Jamie Rivenbark T. Blessings Inglett, Jamie Matthews, [place] at [facility] [place], [address]  Phone: [phone]  FAX: [phone]  Jamie Matthews - 80 y.o. female  MRN [id number]  Date of Birth: [birth year]  Date: 11/07/2020  PCP: [name], Jamie Matthews  Referral: [name], Jamie Matthews  Chief Complaint  Patient presents with   Cough    Negative for Flu & Covid on 10/25/20    This visit occurred during the SARS-CoV-2 public health emergency.  Safety protocols were in place, including screening questions prior to the visit, additional usage of staff PPE, and extensive cleaning of exam room while observing appropriate contact time as indicated for disinfecting solutions.   Subjective:   Jamie Matthews is a 80 y.o. very pleasant female patient with Body mass index is 26.93 kg/m. who presents with the following:  10/25/2020 f/u with negative Covid and Flu: She is here in follow-up with 2 weeks of primary concern of productive cough that has not been improving.  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  She is also on Xalkori.  The primary issue is the cough, and she is still having some upper respiratory congestion, but this is minor in comparison.  She did have a negative COVID test 2 weeks ago.  She has not taken any over-the-counter medication due to her extensive list of ongoing prescription medication.  Review of Systems is noted in the HPI, as appropriate  Objective:   BP 110/60   Pulse (!) 58   Temp 97.7 F (36.5 C) (Temporal)   Ht 5\' 3"  (1.6 m)   Wt 152 lb (68.9 kg)   SpO2 99%   BMI 26.93 kg/m   GEN: No acute distress; alert,appropriate. PULM: Breathing comfortably in no respiratory distress PSYCH: Normally interactive.  CV: RRR, no m/g/r  PULM: Normal respiratory rate, no accessory muscle use. No wheezes, crackles or rhonchi   Laboratory and Imaging  Data:  Assessment and Plan:     ICD-10-CM   1. Community acquired pneumonia, unspecified laterality  J18.9     2. Primary adenocarcinoma of lung, unspecified laterality ([place])  C34.90     3. Immunocompromised ([place])  D84.9      In a setting of productive cough greater than 2 weeks status post viral syndrome, I do think that in this case where the patient has stage IV adenocarcinoma of the lung is immunocompromised that 1 needs to assume that this is community-acquired pneumonia and treat with appropriate antibiotics for broad-spectrum coverage.  She understands red flags.  Meds ordered this encounter  Medications   levofloxacin (LEVAQUIN) 500 MG tablet    Sig: Take 1 tablet (500 mg total) by mouth daily for 10 days.    Dispense:  10 tablet    Refill:  0   Medications Discontinued During This Encounter  Medication Reason   Calcium-Vitamin D 600-200 MG-UNIT tablet Duplicate   No orders of the defined types were placed in this encounter.   Follow-up: No follow-ups on file.  Signed,  Maud Deed. December Hedtke, Jamie Matthews   Outpatient Encounter Medications as of 11/07/2020  Medication Sig   acetaminophen (TYLENOL) 325 MG tablet Take 325 mg by mouth every 6 (six) hours as needed for mild pain.    anastrozole (ARIMIDEX) 1 MG tablet Take 1 tablet (1 mg total) by mouth daily.   atorvastatin (LIPITOR) 20 MG tablet TAKE 1 TABLET BY MOUTH NIGHTLY  Calcium-Magnesium-Vitamin D (CALCIUM 1200+D3 PO) Take 1 tablet by mouth daily.   crizotinib (XALKORI) 250 MG capsule TAKE 1 CAPSULE (250 MG TOTAL) BY MOUTH 2 TIMES DAILY.   desonide (DESOWEN) 0.05 % lotion Apply 1 application topically as needed (after showering).   Dextromethorphan-guaiFENesin (MUCINEX DM) 30-600 MG TB12 Take 1 tablet by mouth 2 (two) times daily as needed (for congestion/cough).   docusate sodium (COLACE) 100 MG capsule TAKE 1 CAPSULE BY MOUTH DAILY   fluticasone (FLONASE) 50 MCG/ACT nasal spray Place 2 sprays into both nostrils daily.    ketoconazole (NIZORAL) 2 % shampoo Apply 1 application topically as needed. 3 times a week   levofloxacin (LEVAQUIN) 500 MG tablet Take 1 tablet (500 mg total) by mouth daily for 10 days.   levothyroxine (SYNTHROID) 25 MCG tablet TAKE 1 TABLET BY MOUTH DAILY   lubiprostone (AMITIZA) 24 MCG capsule TAKE ONE CAPSULE TWICE A DAY WITH MEALS   memantine (NAMENDA) 10 MG tablet Take 1 tablet (10 mg total) by mouth 2 (two) times daily.   mirabegron ER (MYRBETRIQ) 50 MG TB24 tablet Take 1 tablet (50 mg total) by mouth daily. For overactive bladder.   Multiple Vitamin (MULTIVITAMIN WITH MINERALS) TABS tablet Take 1 tablet by mouth daily.   omeprazole (PRILOSEC) 40 MG capsule Take 40 mg by mouth daily.   ondansetron (ZOFRAN) 8 MG tablet TAKE ONE TABLET EVERY EIGHT HOURS AS NEEDED FOR NAUSEA / VOMITING   polyethylene glycol (MIRALAX / GLYCOLAX) packet Take 17 g by mouth daily as needed.   venlafaxine XR (EFFEXOR-XR) 150 MG 24 hr capsule TAKE 1 CAPSULE BY MOUTH EVERY DAY   Vitamin D, Ergocalciferol, (DRISDOL) 1.25 MG (50000 UNIT) CAPS capsule Take 50,000 Units by mouth once a week.   benzonatate (TESSALON PERLES) 100 MG capsule Take 1 capsule (100 mg total) by mouth 3 (three) times daily as needed for cough. (Patient not taking: No sig reported)   buPROPion (WELLBUTRIN XL) 150 MG 24 hr tablet TAKE ONE TABLET BY MOUTH EVERY DAY (Patient not taking: No sig reported)   [DISCONTINUED] Calcium-Vitamin D 600-200 MG-UNIT tablet Take 2 tablets by mouth daily. (Patient not taking: Reported on 10/25/2020)   No facility-administered encounter medications on file as of 11/07/2020.

## 2020-11-09 ENCOUNTER — Other Ambulatory Visit (HOSPITAL_COMMUNITY): Payer: Self-pay

## 2020-11-12 ENCOUNTER — Other Ambulatory Visit (HOSPITAL_COMMUNITY): Payer: Self-pay

## 2020-11-19 ENCOUNTER — Other Ambulatory Visit (HOSPITAL_COMMUNITY): Payer: Self-pay

## 2020-11-20 ENCOUNTER — Telehealth: Payer: Self-pay | Admitting: Family Medicine

## 2020-11-20 NOTE — Telephone Encounter (Signed)
Call from [facility] for updated DNR forms to be faxed over  Fax# [phone] ATTN: Truddie Crumble

## 2020-11-21 ENCOUNTER — Other Ambulatory Visit (HOSPITAL_COMMUNITY): Payer: Self-pay

## 2020-11-21 NOTE — Telephone Encounter (Signed)
I can not find a DNR in pt's chart. I called and left a VM for Portia, at [facility], telling her that I don't have one to refer to and Dr. Einar Pheasant is out of the office until 11/27/20.

## 2020-11-23 ENCOUNTER — Other Ambulatory Visit: Payer: Self-pay

## 2020-11-23 ENCOUNTER — Other Ambulatory Visit (HOSPITAL_COMMUNITY): Payer: Self-pay

## 2020-11-23 DIAGNOSIS — C3490 Malignant neoplasm of unspecified part of unspecified bronchus or lung: Secondary | ICD-10-CM

## 2020-11-27 ENCOUNTER — Inpatient Hospital Stay (HOSPITAL_BASED_OUTPATIENT_CLINIC_OR_DEPARTMENT_OTHER): Payer: Medicare Other | Admitting: Oncology

## 2020-11-27 ENCOUNTER — Encounter: Payer: Self-pay | Admitting: Oncology

## 2020-11-27 ENCOUNTER — Inpatient Hospital Stay: Payer: Medicare Other

## 2020-11-27 ENCOUNTER — Inpatient Hospital Stay: Payer: Medicare Other | Attending: Oncology

## 2020-11-27 DIAGNOSIS — C50912 Malignant neoplasm of unspecified site of left female breast: Secondary | ICD-10-CM | POA: Diagnosis not present

## 2020-11-27 DIAGNOSIS — Z79811 Long term (current) use of aromatase inhibitors: Secondary | ICD-10-CM

## 2020-11-27 DIAGNOSIS — Z170 Estrogen receptor positive status [ER+]: Secondary | ICD-10-CM | POA: Insufficient documentation

## 2020-11-27 DIAGNOSIS — C3490 Malignant neoplasm of unspecified part of unspecified bronchus or lung: Secondary | ICD-10-CM

## 2020-11-27 DIAGNOSIS — M8580 Other specified disorders of bone density and structure, unspecified site: Secondary | ICD-10-CM | POA: Diagnosis not present

## 2020-11-27 DIAGNOSIS — Z5111 Encounter for antineoplastic chemotherapy: Secondary | ICD-10-CM

## 2020-11-27 DIAGNOSIS — N179 Acute kidney failure, unspecified: Secondary | ICD-10-CM

## 2020-11-27 DIAGNOSIS — C3492 Malignant neoplasm of unspecified part of left bronchus or lung: Secondary | ICD-10-CM

## 2020-11-27 DIAGNOSIS — C3412 Malignant neoplasm of upper lobe, left bronchus or lung: Secondary | ICD-10-CM | POA: Diagnosis not present

## 2020-11-27 LAB — CBC WITH DIFFERENTIAL/PLATELET
Abs Immature Granulocytes: 0.06 10*3/uL (ref 0.00–0.07)
Basophils Absolute: 0 10*3/uL (ref 0.0–0.1)
Basophils Relative: 0 %
Eosinophils Absolute: 2.3 10*3/uL — ABNORMAL HIGH (ref 0.0–0.5)
Eosinophils Relative: 25 %
HCT: 39.6 % (ref 36.0–46.0)
Hemoglobin: 13.2 g/dL (ref 12.0–15.0)
Immature Granulocytes: 1 %
Lymphocytes Relative: 24 %
Lymphs Abs: 2.2 10*3/uL (ref 0.7–4.0)
MCH: 32.4 pg (ref 26.0–34.0)
MCHC: 33.3 g/dL (ref 30.0–36.0)
MCV: 97.1 fL (ref 80.0–100.0)
Monocytes Absolute: 1 10*3/uL (ref 0.1–1.0)
Monocytes Relative: 11 %
Neutro Abs: 3.6 10*3/uL (ref 1.7–7.7)
Neutrophils Relative %: 39 %
Platelets: 242 10*3/uL (ref 150–400)
RBC: 4.08 MIL/uL (ref 3.87–5.11)
RDW: 12.9 % (ref 11.5–15.5)
WBC: 9.1 10*3/uL (ref 4.0–10.5)
nRBC: 0 % (ref 0.0–0.2)

## 2020-11-27 LAB — COMPREHENSIVE METABOLIC PANEL
ALT: 12 U/L (ref 0–44)
AST: 18 U/L (ref 15–41)
Albumin: 3.7 g/dL (ref 3.5–5.0)
Alkaline Phosphatase: 81 U/L (ref 38–126)
Anion gap: 7 (ref 5–15)
BUN: 23 mg/dL (ref 8–23)
CO2: 29 mmol/L (ref 22–32)
Calcium: 8.8 mg/dL — ABNORMAL LOW (ref 8.9–10.3)
Chloride: 102 mmol/L (ref 98–111)
Creatinine, Ser: 1.21 mg/dL — ABNORMAL HIGH (ref 0.44–1.00)
GFR, Estimated: 45 mL/min — ABNORMAL LOW (ref >60)
Glucose, Bld: 112 mg/dL — ABNORMAL HIGH (ref 70–99)
Potassium: 3.7 mmol/L (ref 3.5–5.1)
Sodium: 138 mmol/L (ref 135–145)
Total Bilirubin: 0.7 mg/dL (ref 0.3–1.2)
Total Protein: 6.6 g/dL (ref 6.5–8.1)

## 2020-11-27 MED ORDER — DENOSUMAB 120 MG/1.7ML ~~LOC~~ SOLN
120.0000 mg | Freq: Once | SUBCUTANEOUS | Status: AC
  Administered 2020-11-27: 120 mg via SUBCUTANEOUS
  Filled 2020-11-27: qty 1.7

## 2020-11-27 NOTE — Progress Notes (Signed)
Hematology/Oncology follow up note [facility] Telephone:([phone] Fax:([phone]) [phone]   Patient Care Team: [name], MD as PCP - General (Family Medicine) Pyrtle, Lajuan Lines, MD as Consulting Physician (Gastroenterology) Telford Nab, RN as Registered Nurse Earlie Server, MD as Consulting Physician (Hematology and Oncology) Debbora Dus, [facility] as Pharmacist (Pharmacist) Burnice Logan, [facility] as Pharmacist (Pharmacist) Ralene Bathe, MD (Dermatology) Festus Aloe, MD as Consulting Physician (Urology)   REASON FOR VISIT:  Follow up for management of lung cancer and breast cancer  HISTORY OF PRESENTING ILLNESS:  Jamie Matthews is a  80 y.o.  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 05/05/2018 bilateral diagnostic breast mammogram showed suspicious mass 1.1cm  in the 12:00 retroareolar region of the left breast and the left axillary lymph node. 06/08/2018 patient status post a left breast retroareolar and left axillary lymph node biopsy. Pathology showed invasive mammary carcinoma, no special type, grade 1, left axillary lymph node positive for invasive mammary carcinoma clinically metastatic.  Background lymph node architecture is not identified. ER> 90% PR> 90%, HER-2 negative.  PET scan done which unfortunately showed additional hypermetabolic bilateral hilar lymph nodes as well as left upper lobe lung mass which may represent a focus of metastatic disease from breast, or primary lung neoplasm.  There is multiple additional small pulmonary nodules are scattered throughout both lungs which are worrisome for metastasis.  Index nodule within the medial right upper lobe measures 7 mm,, peri-broncho-vascular nodule in the right lower lobe measures 1.2 cm.  # Stage IV lung cancer-  Biopsy of lung mass left upper lobe showed non-small cell lung cancer, favor adenocarcinoma.  #NGS came back patient has PD L1  is 70% TPS, MET fusion mutation.  #Mid-March 2020 started on crizotinib  Bilateral lower extremity edema, right >left, Ultrasound venous right 09/20/2018 no DVT.  2D echo 10/10/2018 showed LVEF 55 to 60% Most likely secondary to crizotinib side effects.  # 12/15/2018 CT chest images were independently reviewed and discussed with patient. Dominant left upper lobe nodule and multiple scattered irregular pulmonary parenchymal nodular lesions are stable. Mild basilar pulmonary parenchymal septal thickening is new and can be seen with pulmonary edema. Patient reports no shortness of breath asymptomatic  #12/29/2018 unilateral left diagnostic mammogram with ultrasound images were independently reviewed and discussed with patient. Slight interval reduction of the size of the left retroareolar breast mass, 9 x 6 x 7 mm, comparing to 10 x 9 x 8 mm. Axillary lymph node measured 1.3 x 1.0 x 1.0 cm, previously 2.2 x 1.4 x 1.5 cm,  there were 2 other abdominal lymph nodes in the left axilla, which were not reported in previous ultrasound. Discussed with patient that overall, breast cancer responded to endocrine treatments.  Additional 2 lymph nodes need to be closely monitored.  Recommend patient to continue Arimidex.   INTERVAL HISTORY Jamie Matthews is a 80 y.o. female who has above history reviewed by me today presents fornon-small cell lung cancer and breast cancer management. Patient reports no new complaints.  She is taking crizotinib.  She is not sure if she is taking Arimidex or calcium Chronic lower extremity swelling has improved.  BP is borderline low today in the clinic. She denies any nausea, vomiting, diarrhea, dizziness, recent falls.  Patient walks with a walker.   Review of Systems  Constitutional:  Negative for appetite change, chills, fatigue and fever.  HENT:   Negative for  hearing loss and voice change.   Eyes:  Negative for eye problems.  Respiratory:  Negative for  chest tightness and cough.   Cardiovascular:  Positive for leg swelling. Negative for chest pain.  Gastrointestinal:  Negative for abdominal distention, abdominal pain, blood in stool, constipation and nausea.  Endocrine: Negative for hot flashes.  Genitourinary:  Negative for difficulty urinating and frequency.   Musculoskeletal:  Negative for arthralgias.  Skin:  Negative for itching and rash.  Neurological:  Negative for extremity weakness and light-headedness.  Hematological:  Negative for adenopathy.  Psychiatric/Behavioral:  Negative for confusion and sleep disturbance.    MEDICAL HISTORY:  Past Medical History:  Diagnosis Date   AKI (acute kidney injury) ([place]) 08/17/2018   Anxiety    Arthritis    Belching    Bladder disorder    OVERACTIVE   Bowel dysfunction    BLOCKAGE   Cancer (HCC)    breast   Constipation    Depression    Diverticulitis    Fibromyalgia    GERD (gastroesophageal reflux disease)    Hyperlipidemia    IBS (irritable bowel syndrome)    Internal hemorrhoids    Lung cancer ([place]) 2020   Memory deficits    Murmur    asymptomatic   Pneumonia 11/18/12   Urinary incontinence    Vertigo     SURGICAL HISTORY: Past Surgical History:  Procedure Laterality Date   AXILLARY LYMPH NODE BIOPSY Left 06/08/2018   INVASIVE MAMMARY CARCINOMA   BLADDER SUSPENSION  2004, 2012   BREAST BIOPSY Left 06/08/2018   INVASIVE MAMMARY CARCINOMA   CATARACT EXTRACTION W/PHACO Right 08/27/2015   Procedure: CATARACT EXTRACTION PHACO AND INTRAOCULAR LENS PLACEMENT ([place]);  Surgeon: Estill Cotta, MD;  Location: [facility] ORS;  Service: Ophthalmology;  Laterality: Right;  [place]   1:00.2 AP%  22.5 CDE  23.67 fluid casette lot #[id number]  exp05/31/2018   CATARACT EXTRACTION W/PHACO Left 10/15/2015   Procedure: CATARACT EXTRACTION PHACO AND INTRAOCULAR LENS PLACEMENT (I[facility]);  Surgeon: Estill Cotta, MD;  Location: [facility] ORS;  Service: Ophthalmology;  Laterality: Left;  [place] 01:07 AP%  18.1 CDE 21.57 fluid pack lot # [id number]   CHOLECYSTECTOMY     COLONOSCOPY  2017   ELECTROMAGNETIC NAVIGATION BROCHOSCOPY N/A 07/09/2018   Procedure: ELECTROMAGNETIC NAVIGATION BRONCHOSCOPY;  Surgeon: Flora Lipps, MD;  Location: [facility] ORS;  Service: Cardiopulmonary;  Laterality: N/A;   TONSILLECTOMY  1947    SOCIAL HISTORY: Social History   Socioeconomic History   Marital status: Married    Spouse name: Scientist, forensic   Number of children: 0   Years of education: Master's Degree   Highest education level: Not on file  Occupational History   Occupation: Retired  Tobacco Use   Smoking status: Never   Smokeless tobacco: Never  Vaping Use   Vaping Use: Never used  Substance and Sexual Activity   Alcohol use: Not Currently    Comment: rare wine   Drug use: No   Sexual activity: Not Currently  Other Topics Concern   Not on file  Social History Narrative   Recently moved with her husband to [place] from [place].   Husband is a retired Pharmacist, community.   No children.   She is a retired Pharmacist, hospital.   Enjoys: Chief Technology Officer, reading - mysteries and biographies, cooking   Exercise: walking, gardening   Diet: low appetite due to cancer treatment, grazing   She is a DNR.   Social Determinants of Health   Financial Resource Strain: Low Risk  Difficulty of Paying Living Expenses: Not hard at all  Food Insecurity: No Food Insecurity   Worried About [place] in the Last Year: Never true   Ran Out of Food in the Last Year: Never true  Transportation Needs: No Transportation Needs   Lack of Transportation (Medical): No   Lack of Transportation (Non-Medical): No  Physical Activity: Inactive   Days of Exercise per Week: 0 days   Minutes of Exercise per Session: 0 min  Stress: No Stress Concern Present   Feeling of Stress : Not at all  Social Connections: Not on file  Intimate Partner Violence: Not At Risk   Fear of Current or Ex-Partner: No   Emotionally Abused: No   Physically  Abused: No   Sexually Abused: No    FAMILY HISTORY: Family History  Problem Relation Age of Onset   Diabetes Father    Heart disease Father    Lymphoma Father    Heart disease Mother    Breast cancer Sister 50   Colon cancer Neg Hx    Esophageal cancer Neg Hx    Rectal cancer Neg Hx    Stomach cancer Neg Hx    Bladder Cancer Neg Hx    Kidney cancer Neg Hx     ALLERGIES:  is allergic to sulfa antibiotics.  MEDICATIONS:  Current Outpatient Medications  Medication Sig Dispense Refill   acetaminophen (TYLENOL) 325 MG tablet Take 325 mg by mouth every 6 (six) hours as needed for mild pain.      anastrozole (ARIMIDEX) 1 MG tablet Take 1 tablet (1 mg total) by mouth daily. 90 tablet 1   benzonatate (TESSALON PERLES) 100 MG capsule Take 1 capsule (100 mg total) by mouth 3 (three) times daily as needed for cough. 30 capsule 0   buPROPion (WELLBUTRIN XL) 150 MG 24 hr tablet TAKE ONE TABLET BY MOUTH EVERY DAY 90 tablet 2   Calcium-Magnesium-Vitamin D (CALCIUM 1200+D3 PO) Take 1 tablet by mouth daily.     desonide (DESOWEN) 0.05 % lotion Apply 1 application topically as needed (after showering).     Dextromethorphan-guaiFENesin (MUCINEX DM) 30-600 MG TB12 Take 1 tablet by mouth 2 (two) times daily as needed (for congestion/cough).     docusate sodium (COLACE) 100 MG capsule TAKE 1 CAPSULE BY MOUTH DAILY 90 capsule 0   fluticasone (FLONASE) 50 MCG/ACT nasal spray Place 2 sprays into both nostrils daily. 16 g 6   ketoconazole (NIZORAL) 2 % shampoo Apply 1 application topically as needed. 3 times a week     levothyroxine (SYNTHROID) 25 MCG tablet TAKE 1 TABLET BY MOUTH DAILY 90 tablet 3   lubiprostone (AMITIZA) 24 MCG capsule TAKE ONE CAPSULE TWICE A DAY WITH MEALS 60 capsule 3   memantine (NAMENDA) 10 MG tablet Take 1 tablet (10 mg total) by mouth 2 (two) times daily. 180 tablet 2   mirabegron ER (MYRBETRIQ) 50 MG TB24 tablet Take 1 tablet (50 mg total) by mouth daily. For overactive bladder.  90 tablet 0   omeprazole (PRILOSEC) 40 MG capsule Take 40 mg by mouth daily.     polyethylene glycol (MIRALAX / GLYCOLAX) packet Take 17 g by mouth daily as needed.     venlafaxine XR (EFFEXOR-XR) 150 MG 24 hr capsule TAKE 1 CAPSULE BY MOUTH EVERY DAY 90 capsule 1   Vitamin D, Ergocalciferol, (DRISDOL) 1.25 MG (50000 UNIT) CAPS capsule Take 50,000 Units by mouth once a week.     atorvastatin (LIPITOR) 20 MG  tablet TAKE 1 TABLET BY MOUTH NIGHTLY (Patient not taking: Reported on 11/27/2020) 90 tablet 0   crizotinib (XALKORI) 250 MG capsule TAKE 1 CAPSULE (250 MG TOTAL) BY MOUTH 2 TIMES DAILY. (Patient not taking: Reported on 11/27/2020) 60 capsule 2   Multiple Vitamin (MULTIVITAMIN WITH MINERALS) TABS tablet Take 1 tablet by mouth daily. (Patient not taking: Reported on 11/27/2020)     ondansetron (ZOFRAN) 8 MG tablet TAKE ONE TABLET EVERY EIGHT HOURS AS NEEDED FOR NAUSEA / VOMITING (Patient not taking: Reported on 11/27/2020) 60 tablet 1   No current facility-administered medications for this visit.     PHYSICAL EXAMINATION: ECOG PERFORMANCE STATUS: 2 - Symptomatic, <50% confined to bed Vitals:   11/27/20 1502  BP: 91/64  Pulse: 63  Resp: 17  Temp: (!) 97.4 F (36.3 C)  SpO2: 97%   Filed Weights   11/27/20 1502  Weight: 152 lb (68.9 kg)    Physical Exam Constitutional:      General: She is not in acute distress.    Appearance: She is not diaphoretic.     Comments: Frail female, walks with a walker  HENT:     Head: Normocephalic and atraumatic.     Nose: Nose normal.     Mouth/Throat:     Pharynx: No oropharyngeal exudate.  Eyes:     General: No scleral icterus.    Pupils: Pupils are equal, round, and reactive to light.  Cardiovascular:     Rate and Rhythm: Normal rate and regular rhythm.     Heart sounds: No murmur heard. Pulmonary:     Effort: Pulmonary effort is normal. No respiratory distress.     Breath sounds: No wheezing or rales.  Chest:     Chest wall: No tenderness.   Abdominal:     General: There is no distension.     Palpations: Abdomen is soft.     Tenderness: There is no abdominal tenderness.  Musculoskeletal:        General: Normal range of motion.     Cervical back: Normal range of motion and neck supple.     Comments: Bilateral lower extremity 1+ edema  Skin:    General: Skin is warm and dry.     Findings: No erythema.  Neurological:     Mental Status: She is alert and oriented to person, place, and time.     Cranial Nerves: No cranial nerve deficit.     Motor: No abnormal muscle tone.     Coordination: Coordination normal.  Psychiatric:        Mood and Affect: Affect normal.     CMP Latest Ref Rng & Units 11/27/2020  Glucose 70 - 99 mg/dL 112(H)  BUN 8 - 23 mg/dL 23  Creatinine 0.44 - 1.00 mg/dL 1.21(H)  Sodium 135 - 145 mmol/L 138  Potassium 3.5 - 5.1 mmol/L 3.7  Chloride 98 - 111 mmol/L 102  CO2 22 - 32 mmol/L 29  Calcium 8.9 - 10.3 mg/dL 8.8(L)  Total Protein 6.5 - 8.1 g/dL 6.6  Total Bilirubin 0.3 - 1.2 mg/dL 0.7  Alkaline Phos 38 - 126 U/L 81  AST 15 - 41 U/L 18  ALT 0 - 44 U/L 12   CBC Latest Ref Rng & Units 11/27/2020  WBC 4.0 - 10.5 K/uL 9.1  Hemoglobin 12.0 - 15.0 g/dL 13.2  Hematocrit 36.0 - 46.0 % 39.6  Platelets 150 - 400 K/uL 242    LABORATORY DATA:  I have reviewed the data as listed  Lab Results  Component Value Date   WBC 9.1 11/27/2020   HGB 13.2 11/27/2020   HCT 39.6 11/27/2020   MCV 97.1 11/27/2020   PLT 242 11/27/2020   Recent Labs    12/01/19 1316 12/05/19 1200 01/02/20 0832 01/31/20 0917 04/04/20 1243 09/04/20 1317 10/15/20 1334 11/27/20 1442  NA 134*   < > 142 140   < > 140 137 138  K 4.2   < > 5.0 4.1   < > 4.1 4.1 3.7  CL 100   < > 105 107   < > 106 102 102  CO2 27   < > 29 26   < > $R'27 25 29  'al$ GLUCOSE 109*   < > 132* 102*   < > 136* 129* 112*  BUN 33*   < > 19 23   < > 22 27* 23  CREATININE 1.23*   < > 0.95 1.13*   < > 0.94 1.16* 1.21*  CALCIUM 7.2*   < > 8.7* 7.5*   < > 8.0* 8.5*  8.8*  GFRNONAA 41*  --  56* 46*   < > >60 47* 45*  GFRAA 48*  --  >60 53*  --   --   --   --   PROT  --   --  6.5 5.8*   < > 6.2* 6.6 6.6  ALBUMIN  --   --  3.5 3.3*   < > 3.7 3.8 3.7  AST  --   --  25 25   < > $R'22 22 18  'dl$ ALT  --   --  24 25   < > $R'20 18 12  'WF$ ALKPHOS  --   --  92 82   < > 65 79 81  BILITOT  --   --  0.7 0.7   < > 0.6 0.7 0.7   < > = values in this interval not displayed.    Iron/TIBC/Ferritin/ %Sat No results found for: IRON, TIBC, FERRITIN, IRONPCTSAT   RADIOGRAPHIC STUDIES: I have personally reviewed the radiological images as listed and agreed with the findings in the report. MR Brain W Wo Contrast  Result Date: 10/24/2020 CLINICAL DATA:  History of lung and breast cancer.  Follow-up. EXAM: MRI HEAD WITHOUT AND WITH CONTRAST TECHNIQUE: Multiplanar, multiecho pulse sequences of the brain and surrounding structures were obtained without and with intravenous contrast. CONTRAST:  42mL GADAVIST GADOBUTROL 1 MMOL/ML IV SOLN COMPARISON:  05/22/2020 FINDINGS: The study is mildly motion degraded. Brain: There is no evidence of an acute infarct, intracranial hemorrhage, mass, midline shift, or extra-axial fluid collection. T2 hyperintensities in the cerebral white matter and pons are unchanged and nonspecific but compatible with mild chronic small vessel ischemic disease. There is moderate cerebral atrophy. No abnormal enhancement is identified. Vascular: Major intracranial vascular flow voids are preserved. Skull and upper cervical spine: Unremarkable bone marrow signal. Sinuses/Orbits: Bilateral cataract extraction. Minimal mucosal thickening in the paranasal sinuses. Clear mastoid air cells. Other: None. IMPRESSION: 1. No evidence of intracranial metastases. 2. Mild chronic small vessel ischemic disease and moderate cerebral atrophy. Electronically Signed   By: Logan Bores M.D.   On: 10/24/2020 15:57   CT CHEST ABDOMEN PELVIS WO CONTRAST  Result Date: 09/21/2020 CLINICAL DATA:   Metastatic left lung cancer, ongoing chemotherapy, history of left breast cancer EXAM: CT CHEST, ABDOMEN AND PELVIS WITHOUT CONTRAST TECHNIQUE: Multidetector CT imaging of the chest, abdomen and pelvis was performed following the standard protocol without IV contrast. COMPARISON:  CT chest, 05/07/2020, CT abdomen pelvis, 01/26/2020 FINDINGS: CT CHEST FINDINGS Cardiovascular: Aortic atherosclerosis. Normal heart size. Left coronary artery calcifications. No pericardial effusion. Mediastinum/Nodes: No enlarged mediastinal, hilar, or axillary lymph nodes. Thyroid gland, trachea, and esophagus demonstrate no significant findings. Lungs/Pleura: Stable, faintly calcified mass of the anterior left pulmonary apex measuring 2.3 x 1.7 cm (series 4, image 29). Occasional small pulmonary nodules are stable, for example a 5 mm nodule of the right middle lobe (series 4, image 72). No pleural effusion or pneumothorax. Musculoskeletal: No chest wall mass or suspicious bone lesions identified. Stable inferior endplate wedge deformity of the T9 vertebral body and superior endplate wedge deformity of the T11 vertebral body (series 6, image 88). CT ABDOMEN PELVIS FINDINGS Hepatobiliary: No focal liver abnormality is seen. Status post cholecystectomy. No biliary dilatation. Pancreas: Unremarkable. No pancreatic ductal dilatation or surrounding inflammatory changes. Spleen: Normal in size without significant abnormality. Adrenals/Urinary Tract: Adrenal glands are unremarkable. Kidneys are normal, without renal calculi, solid lesion, or hydronephrosis. Bladder is unremarkable. Stomach/Bowel: Stomach is within normal limits. Appendix appears normal. No evidence of bowel wall thickening, distention, or inflammatory changes. Large burden of stool throughout the colon and rectum. Vascular/Lymphatic: Aortic atherosclerosis. No enlarged abdominal or pelvic lymph nodes. Reproductive: No mass or other abnormality. Other: No abdominal wall hernia  or abnormality. No abdominopelvic ascites. Musculoskeletal: No acute or significant osseous findings. Nonacute, callused fractures of the right pubic symphysis and inferior pubic ramus. IMPRESSION: 1. Stable, faintly calcified mass of the anterior left pulmonary apex measuring 2.3 x 1.7 cm. 2. Occasional small pulmonary nodules are stable and remain nonspecific, including a previously new 3 mm nodule of the right lower lobe. Attention on follow-up. 3. No noncontrast evidence of metastatic disease in the abdomen or pelvis. 4. Coronary artery disease. Aortic Atherosclerosis (ICD10-I70.0). Electronically Signed   By: Eddie Candle M.D.   On: 09/21/2020 18:00       ASSESSMENT & PLAN:  1. Hypocalcemia   2. Encounter for antineoplastic chemotherapy   3. Malignant neoplasm of left female breast, unspecified estrogen receptor status, unspecified site of breast ([place])   4. Aromatase inhibitor use   5. Primary adenocarcinoma of left lung ([place])   6. Osteopenia, unspecified location   Cancer Staging Malignant neoplasm of left female breast [facility]) Staging form: Breast, AJCC 8th Edition - Clinical stage from 12/17/2018: Stage IB (cT1b, cN1, cM0, G1, ER+, PR+, HER2-) - Signed by Earlie Server, MD on 12/17/2018  Primary adenocarcinoma of lung [facility]) Staging form: Lung, AJCC 8th Edition - Clinical stage from 09/28/2018: Stage IVA (cT2, cN3, cM1a) - Signed by Earlie Server, MD on 09/28/2018   Patient has 2 primaries.   Stage IV lung adenocarcinoma, met fusion mutation cT2 N3 M1a PDL 1 TPS more than 70%  Labs reviewed and discussed with patient Clinically doing well Continue crizotinib.   09/21/2020 CT chest abdomen pelvis reviewed stable disease, anterior left pulmonary apex 2.3 x 1.7 cm unchanged.  Small pulmonary nodules are stable and remain nonspecific. 10/24/20 MRI brain showed no intracranial metastasis.   Breast Cancer,  Recommend patient to continue Arimidex.  I wrote down the medication name and patient will check  her pillbox to see if she is taking it.  #hypocalcemia, Calcium level 8.8.   Continue calcium supplementation. Xgeva today   Follow-up in 6 weeks. Obtain CT scan - End July/Early August All questions were answered. The patient knows to call the clinic with any problems questions or concerns.  Earlie Server, MD, PhD  11/27/2020 

## 2020-11-27 NOTE — Progress Notes (Signed)
Patient here for oncology follow-up appointment, expresses  concerns of lightheaded, fatigue, and constipation

## 2020-12-04 ENCOUNTER — Other Ambulatory Visit (HOSPITAL_COMMUNITY): Payer: Self-pay

## 2020-12-10 ENCOUNTER — Other Ambulatory Visit: Payer: Self-pay | Admitting: Family Medicine

## 2020-12-10 ENCOUNTER — Other Ambulatory Visit (HOSPITAL_COMMUNITY): Payer: Self-pay

## 2020-12-10 NOTE — Telephone Encounter (Signed)
Last office visit 11/07/2020 with Dr. Lorelei Pont for CAP.  Last refilled 04/23/2020 for #30 with no refills. No future appointments with PCP.

## 2020-12-28 ENCOUNTER — Emergency Department
Admission: EM | Admit: 2020-12-28 | Discharge: 2020-12-28 | Disposition: A | Discharge status: Home / Self Care | Payer: Medicare Other | Attending: Emergency Medicine | Admitting: Emergency Medicine

## 2020-12-28 ENCOUNTER — Encounter: Payer: Self-pay | Admitting: Emergency Medicine

## 2020-12-28 ENCOUNTER — Emergency Department: Payer: Medicare Other

## 2020-12-28 ENCOUNTER — Other Ambulatory Visit: Payer: Self-pay

## 2020-12-28 DIAGNOSIS — R9431 Abnormal electrocardiogram [ECG] [EKG]: Secondary | ICD-10-CM | POA: Diagnosis not present

## 2020-12-28 DIAGNOSIS — Z853 Personal history of malignant neoplasm of breast: Secondary | ICD-10-CM | POA: Insufficient documentation

## 2020-12-28 DIAGNOSIS — J40 Bronchitis, not specified as acute or chronic: Secondary | ICD-10-CM | POA: Insufficient documentation

## 2020-12-28 DIAGNOSIS — R059 Cough, unspecified: Secondary | ICD-10-CM

## 2020-12-28 DIAGNOSIS — J0100 Acute maxillary sinusitis, unspecified: Secondary | ICD-10-CM | POA: Insufficient documentation

## 2020-12-28 DIAGNOSIS — Z79899 Other long term (current) drug therapy: Secondary | ICD-10-CM | POA: Insufficient documentation

## 2020-12-28 DIAGNOSIS — Z7952 Long term (current) use of systemic steroids: Secondary | ICD-10-CM | POA: Diagnosis not present

## 2020-12-28 DIAGNOSIS — R062 Wheezing: Secondary | ICD-10-CM | POA: Diagnosis not present

## 2020-12-28 DIAGNOSIS — Z85118 Personal history of other malignant neoplasm of bronchus and lung: Secondary | ICD-10-CM | POA: Diagnosis not present

## 2020-12-28 DIAGNOSIS — R069 Unspecified abnormalities of breathing: Secondary | ICD-10-CM | POA: Diagnosis not present

## 2020-12-28 DIAGNOSIS — R0602 Shortness of breath: Secondary | ICD-10-CM | POA: Diagnosis not present

## 2020-12-28 DIAGNOSIS — R0902 Hypoxemia: Secondary | ICD-10-CM | POA: Diagnosis not present

## 2020-12-28 DIAGNOSIS — J9811 Atelectasis: Secondary | ICD-10-CM | POA: Diagnosis not present

## 2020-12-28 LAB — BASIC METABOLIC PANEL
Anion gap: 7 (ref 5–15)
BUN: 24 mg/dL — ABNORMAL HIGH (ref 8–23)
CO2: 29 mmol/L (ref 22–32)
Calcium: 8.1 mg/dL — ABNORMAL LOW (ref 8.9–10.3)
Chloride: 102 mmol/L (ref 98–111)
Creatinine, Ser: 1.42 mg/dL — ABNORMAL HIGH (ref 0.44–1.00)
GFR, Estimated: 37 mL/min — ABNORMAL LOW (ref >60)
Glucose, Bld: 119 mg/dL — ABNORMAL HIGH (ref 70–99)
Potassium: 4.5 mmol/L (ref 3.5–5.1)
Sodium: 138 mmol/L (ref 135–145)

## 2020-12-28 LAB — CBC
HCT: 36.5 % (ref 36.0–46.0)
Hemoglobin: 12.1 g/dL (ref 12.0–15.0)
MCH: 32.3 pg (ref 26.0–34.0)
MCHC: 33.2 g/dL (ref 30.0–36.0)
MCV: 97.3 fL (ref 80.0–100.0)
Platelets: 348 10*3/uL (ref 150–400)
RBC: 3.75 MIL/uL — ABNORMAL LOW (ref 3.87–5.11)
RDW: 13.4 % (ref 11.5–15.5)
WBC: 9.1 10*3/uL (ref 4.0–10.5)
nRBC: 0 % (ref 0.0–0.2)

## 2020-12-28 MED ORDER — IPRATROPIUM-ALBUTEROL 0.5-2.5 (3) MG/3ML IN SOLN
3.0000 mL | Freq: Once | RESPIRATORY_TRACT | Status: AC
  Administered 2020-12-28: 3 mL via RESPIRATORY_TRACT
  Filled 2020-12-28: qty 3

## 2020-12-28 MED ORDER — ALBUTEROL SULFATE HFA 108 (90 BASE) MCG/ACT IN AERS: 2.0000 | INHALATION_SPRAY | Freq: Four times a day (QID) | RESPIRATORY_TRACT | 2 refills | Status: DC | PRN

## 2020-12-28 MED ORDER — DOXYCYCLINE HYCLATE 100 MG PO TABS
100.0000 mg | ORAL_TABLET | Freq: Once | ORAL | Status: AC
  Administered 2020-12-28: 100 mg via ORAL
  Filled 2020-12-28: qty 1

## 2020-12-28 MED ORDER — PREDNISONE 10 MG PO TABS: 10.0000 mg | ORAL_TABLET | Freq: Every day | ORAL | 0 refills | Status: DC

## 2020-12-28 MED ORDER — ALBUTEROL SULFATE HFA 108 (90 BASE) MCG/ACT IN AERS
2.0000 | INHALATION_SPRAY | RESPIRATORY_TRACT | Status: DC | PRN
  Filled 2020-12-28: qty 6.7

## 2020-12-28 MED ORDER — DOXYCYCLINE MONOHYDRATE 100 MG PO TABS: 100.0000 mg | ORAL_TABLET | Freq: Two times a day (BID) | ORAL | 0 refills | Status: AC

## 2020-12-28 MED ORDER — METHYLPREDNISOLONE SODIUM SUCC 125 MG IJ SOLR
125.0000 mg | Freq: Once | INTRAMUSCULAR | Status: AC
  Administered 2020-12-28: 125 mg via INTRAMUSCULAR
  Filled 2020-12-28: qty 2

## 2020-12-28 NOTE — ED Notes (Signed)
This RN received a call from a staff member at [facility]. Per staff at [facility], patient cannot be discharged back to independent living at this time. The patient needs to be discharged to the SNF at [facility] with her husband for tonight. This RN to call [facility] security at patient discharge.

## 2020-12-28 NOTE — TOC Progression Note (Addendum)
Transition of Care [facility]) - Progression Note    Patient Details  Name: Jamie Matthews MRN: [id number] Date of Birth: 09-Jul-1940  Transition of Care [facility]) CM/SW Contact  Anselm Pancoast, RN Phone Number: 12/28/2020, 5:08 PM  Clinical Narrative:    Received call from [facility] @ [facility] reports patient will be returning to [facility] through [facility] as her spouse was also recently transferred to [facility]. Patient will need Fl2 upon returning for insurance purposes.         Expected Discharge Plan and Services                                                 Social Determinants of Health (SDOH) Interventions    Readmission Risk Interventions Readmission Risk Prevention Plan 10/10/2018  Post Dischage Appt Complete  Medication Screening Complete  Transportation Screening Complete  Some recent data might be hidden

## 2020-12-28 NOTE — Discharge Instructions (Addendum)
Please take albuterol, prednisone and doxycycline as prescribed.  Return to the ER for any fevers, worsening symptoms or new changes in your health.

## 2020-12-28 NOTE — ED Provider Notes (Signed)
[facility] Provider Note   CSN: [id number] Arrival date & time: 12/28/20  1409     History Chief Complaint  Patient presents with   Cough   Shortness of Breath    Jamie Matthews is a 80 y.o. female presents to the emergency department evaluation of shortness of breath, wheezing.  States she has had worsening cough and wheezing over the last week.  She denies any fevers chills.  Cough is nonproductive.  Is having some sinus congestion and pressure.  She denies any nausea vomiting or diarrhea.  She has not been using any type of albuterol at home.  She denies any chest pain.  HPI     Past Medical History:  Diagnosis Date   AKI (acute kidney injury) ([place]) 08/17/2018   Anxiety    Arthritis    Belching    Bladder disorder    OVERACTIVE   Bowel dysfunction    BLOCKAGE   Cancer (HCC)    breast   Constipation    Depression    Diverticulitis    Fibromyalgia    GERD (gastroesophageal reflux disease)    Hyperlipidemia    IBS (irritable bowel syndrome)    Internal hemorrhoids    Lung cancer ([place]) 2020   Memory deficits    Murmur    asymptomatic   Pneumonia 11/18/12   Urinary incontinence    Vertigo     Patient Active Problem List   Diagnosis Date Noted   Aortic valve stenosis 01/19/2020   Left hip pain 12/05/2019   Hypocalcemia 11/02/2019   Trochanteric bursitis 09/20/2019   Pruritus 09/06/2019   Overactive bladder 09/06/2019   Chronic pain of right knee 09/06/2019   Multiple rib fractures 07/15/2019   Fall as cause of accidental injury at home as place of occurrence 07/15/2019   Encounter for antineoplastic chemotherapy 12/17/2018   Lower leg edema 12/17/2018   Aromatase inhibitor use 10/24/2018   Osteopenia 10/24/2018   Fever 10/10/2018   Closed displaced fracture of pubis ([place]) 09/20/2018   Localized swelling of right lower leg 09/20/2018   AKI (acute kidney injury) ([place]) 08/17/2018   Myofascial pain  07/28/2018   Primary adenocarcinoma of lung ([place]) 07/21/2018   Malignant neoplasm of left female breast ([place]) 07/21/2018   Adenopathy    Goals of care, counseling/discussion 06/26/2018   IBS (irritable bowel syndrome) 05/25/2018   BPV (benign positional vertigo) 07/30/2017   Unilateral primary osteoarthritis, left knee 06/24/2017   Bunion of great toe of right foot 06/09/2017   Dizziness 03/24/2017   Decreased appetite 12/03/2016   Fatigue 09/22/2016   Insomnia 09/22/2016   Depression, recurrent ([place]) 09/22/2016   Chronic cough 08/21/2016   Primary localized osteoarthrosis, hand 03/19/2016   OA (osteoarthritis) of knee 03/17/2016   Stress due to illness of family member 10/10/2014   Ileitis 06/22/2014   Chronic insomnia 09/20/2013   DNR (do not resuscitate) 11/15/2012   Other constipation 04/01/2012   Memory loss 02/05/2012   Urinary incontinence    Major depressive disorder, recurrent, moderate    Gastroesophageal reflux disease    Hyperlipidemia     Past Surgical History:  Procedure Laterality Date   AXILLARY LYMPH NODE BIOPSY Left 06/08/2018   INVASIVE MAMMARY CARCINOMA   BLADDER SUSPENSION  2004, 2012   BREAST BIOPSY Left 06/08/2018   INVASIVE MAMMARY CARCINOMA   CATARACT EXTRACTION W/PHACO Right 08/27/2015   Procedure: CATARACT EXTRACTION PHACO AND INTRAOCULAR LENS PLACEMENT ([place]);  Surgeon: Estill Cotta, MD;  Location: [facility] ORS;  Service: Ophthalmology;  Laterality: Right;  [place]   1:00.2 AP%  22.5 CDE  23.67 fluid casette lot #[id number]  exp05/31/2018   CATARACT EXTRACTION W/PHACO Left 10/15/2015   Procedure: CATARACT EXTRACTION PHACO AND INTRAOCULAR LENS PLACEMENT ([facility]);  Surgeon: Estill Cotta, MD;  Location: [facility] ORS;  Service: Ophthalmology;  Laterality: Left;  [place] 01:07 AP% 18.1 CDE 21.57 fluid pack lot # [id number]   CHOLECYSTECTOMY     COLONOSCOPY  2017   ELECTROMAGNETIC NAVIGATION BROCHOSCOPY N/A 07/09/2018   Procedure: ELECTROMAGNETIC NAVIGATION  BRONCHOSCOPY;  Surgeon: Flora Lipps, MD;  Location: [facility] ORS;  Service: Cardiopulmonary;  Laterality: N/A;   TONSILLECTOMY  1947     OB History     Gravida  0   Para  0   Term  0   Preterm  0   AB  0   Living  0      SAB  0   IAB  0   Ectopic  0   Multiple  0   Live Births  0        Obstetric Comments  1st Menstrual Cycle:  30             Family History  Problem Relation Age of Onset   Diabetes Father    Heart disease Father    Lymphoma Father    Heart disease Mother    Breast cancer Sister 40   Colon cancer Neg Hx    Esophageal cancer Neg Hx    Rectal cancer Neg Hx    Stomach cancer Neg Hx    Bladder Cancer Neg Hx    Kidney cancer Neg Hx     Social History   Tobacco Use   Smoking status: Never   Smokeless tobacco: Never  Vaping Use   Vaping Use: Never used  Substance Use Topics   Alcohol use: Not Currently    Comment: rare wine   Drug use: No    Home Medications Prior to Admission medications   Medication Sig Start Date End Date Taking? Authorizing Provider  albuterol (VENTOLIN HFA) 108 (90 Base) MCG/ACT inhaler Inhale 2 puffs into the lungs every 6 (six) hours as needed for wheezing or shortness of breath. 12/28/20  Yes Duanne Guess, PA-C  doxycycline (ADOXA) 100 MG tablet Take 1 tablet (100 mg total) by mouth 2 (two) times daily for 10 days. 12/28/20 01/07/21 Yes Duanne Guess, PA-C  predniSONE (DELTASONE) 10 MG tablet Take 1 tablet (10 mg total) by mouth daily. 6,5,4,3,2,1 six day taper 12/28/20  Yes Duanne Guess, PA-C  acetaminophen (TYLENOL) 325 MG tablet Take 325 mg by mouth every 6 (six) hours as needed for mild pain.     [provider]  anastrozole (ARIMIDEX) 1 MG tablet Take 1 tablet (1 mg total) by mouth daily. 06/25/20   Earlie Server, MD  atorvastatin (LIPITOR) 20 MG tablet TAKE 1 TABLET BY MOUTH NIGHTLY Patient not taking: Reported on 11/27/2020 02/03/20   [name], MD  benzonatate (TESSALON) 100 MG capsule TAKE  1 CAPSULE BY MOUTH 3 TIMES DAILY ASNEEDED FOR COUGH. 12/10/20   [name], MD  buPROPion (WELLBUTRIN XL) 150 MG 24 hr tablet TAKE ONE TABLET BY MOUTH EVERY DAY 09/29/18   Lucille Passy, MD  Calcium-Magnesium-Vitamin D (CALCIUM 1200+D3 PO) Take 1 tablet by mouth daily.    [provider]  crizotinib Hulda Humphrey) 250 MG capsule TAKE 1 CAPSULE (250 MG TOTAL) BY MOUTH 2 TIMES DAILY.  Patient not taking: Reported on 11/27/2020 05/17/20 05/17/21  Earlie Server, MD  desonide (DESOWEN) 0.05 % lotion Apply 1 application topically as needed (after showering).    [provider]  Dextromethorphan-guaiFENesin (MUCINEX DM) 30-600 MG TB12 Take 1 tablet by mouth 2 (two) times daily as needed (for congestion/cough).    [provider]  docusate sodium (COLACE) 100 MG capsule TAKE 1 CAPSULE BY MOUTH DAILY 10/16/20   Earlie Server, MD  fluticasone [facility]) 50 MCG/ACT nasal spray Place 2 sprays into both nostrils daily. 04/03/20   [name], MD  ketoconazole (NIZORAL) 2 % shampoo Apply 1 application topically as needed. 3 times a week 04/20/18   [provider]  levothyroxine (SYNTHROID) 25 MCG tablet TAKE 1 TABLET BY MOUTH DAILY 02/29/20   [name], MD  lubiprostone (AMITIZA) 24 MCG capsule TAKE ONE CAPSULE TWICE A DAY WITH MEALS 02/09/18   Pyrtle, Lajuan Lines, MD  memantine (NAMENDA) 10 MG tablet Take 1 tablet (10 mg total) by mouth 2 (two) times daily. 10/19/20   [name], MD  mirabegron ER (MYRBETRIQ) 50 MG TB24 tablet Take 1 tablet (50 mg total) by mouth daily. For overactive bladder. 11/02/20   Pleas Koch, NP  Multiple Vitamin (MULTIVITAMIN WITH MINERALS) TABS tablet Take 1 tablet by mouth daily. Patient not taking: Reported on 11/27/2020    [provider]  omeprazole (PRILOSEC) 40 MG capsule Take 40 mg by mouth daily.    [provider]  ondansetron (ZOFRAN) 8 MG tablet TAKE ONE TABLET EVERY EIGHT HOURS AS NEEDED FOR NAUSEA / VOMITING Patient not taking:  Reported on 11/27/2020 10/19/20   Earlie Server, MD  polyethylene glycol [facility] Floria Raveling) packet Take 17 g by mouth daily as needed.    [provider]  venlafaxine XR (EFFEXOR-XR) 150 MG 24 hr capsule TAKE 1 CAPSULE BY MOUTH EVERY DAY 12/01/19   Harold Hedge, MD  Vitamin D, Ergocalciferol, (DRISDOL) 1.25 MG (50000 UNIT) CAPS capsule Take 50,000 Units by mouth once a week. 12/20/19   [provider]    Allergies    Sulfa antibiotics  Review of Systems   Review of Systems  Constitutional:  Negative for chills and fever.  HENT:  Positive for rhinorrhea.   Respiratory:  Positive for cough, shortness of breath and wheezing. Negative for chest tightness.   Cardiovascular:  Negative for chest pain.  Gastrointestinal:  Negative for diarrhea, nausea and vomiting.  Skin:  Negative for rash and wound.  Neurological:  Negative for dizziness, seizures, light-headedness and headaches.   Physical Exam Updated Vital Signs BP (!) 133/105 (BP Location: Right Arm)   Pulse (!) 54   Temp 98.7 F (37.1 C) (Oral)   Resp (!) 22   Ht 5\' 4"  (1.626 m)   Wt 62.1 kg   SpO2 98%   BMI 23.52 kg/m   Physical Exam Constitutional:      Appearance: She is well-developed.  HENT:     Head: Normocephalic and atraumatic.     Comments: Positive bilateral maxillary tenderness with palpation.  No frontal maxillary tenderness.    Mouth/Throat:     Pharynx: No pharyngeal swelling or oropharyngeal exudate.  Eyes:     Conjunctiva/sclera: Conjunctivae normal.  Cardiovascular:     Rate and Rhythm: Normal rate.  Pulmonary:     Effort: Pulmonary effort is normal. No respiratory distress.     Breath sounds: Wheezing present.     Comments: Positive expiratory wheezing bilaterally, this resolved  with DuoNeb treatment x2.  Good air movement bilaterally after breathing treatments Abdominal:     General: Abdomen is flat. Bowel sounds are normal. There is no distension.     Palpations: There is no mass.      Tenderness: There is no abdominal tenderness.  Musculoskeletal:        General: Normal range of motion.     Cervical back: Normal range of motion.  Skin:    General: Skin is warm.     Findings: No rash.  Neurological:     General: No focal deficit present.     Mental Status: She is alert and oriented to person, place, and time. Mental status is at baseline.     Cranial Nerves: No cranial nerve deficit.  Psychiatric:        Behavior: Behavior normal.        Thought Content: Thought content normal.    ED Results / Procedures / Treatments   Labs (all labs ordered are listed, but only abnormal results are displayed) Labs Reviewed  CBC - Abnormal; Notable for the following components:      Result Value   RBC 3.75 (*)    All other components within normal limits  BASIC METABOLIC PANEL - Abnormal; Notable for the following components:   Glucose, Bld 119 (*)    BUN 24 (*)    Creatinine, Ser 1.42 (*)    Calcium 8.1 (*)    GFR, Estimated 37 (*)    All other components within normal limits    EKG EKG Interpretation  Date/Time:  Friday December 28 2020 14:45:41 EDT Ventricular Rate:  53 PR Interval:  180 QRS Duration: 90 QT Interval:  442 QTC Calculation: 414 R Axis:   -3 Text Interpretation: Sinus bradycardia with Premature atrial complexes Otherwise normal ECG Confirmed by UNCONFIRMED, DOCTOR ([id number]), editor Mel Almond, Tammy [phone]) on 12/28/2020 4:03:21 PM  Radiology DG Chest 2 View  Result Date: 12/28/2020 CLINICAL DATA:  Cough, wheezing and shortness of breath 1 week. EXAM: CHEST - 2 VIEW COMPARISON:  Chest CT 09/21/2020 FINDINGS: The cardiac silhouette, mediastinal and hilar contours are within normal limits and stable. Stable left upper lobe pulmonary lesion. Stable left upper lobe calcified granulomas. Stable mild eventration of the right hemidiaphragm. Mild streaky bibasilar atelectasis but no definite infiltrates or edema. Suspect small bilateral pleural effusions on the lateral  film. Remote healed rib fractures are noted. IMPRESSION: 1. Stable left upper lobe pulmonary lesion. 2. Suspect small bilateral pleural effusions and bibasilar atelectasis. Electronically Signed   By: Marijo Sanes M.D.   On: 12/28/2020 15:35    Procedures Procedures   Medications Ordered in ED Medications  doxycycline (VIBRA-TABS) tablet 100 mg (has no administration in time range)  ipratropium-albuterol (DUONEB) 0.5-2.5 (3) MG/3ML nebulizer solution 3 mL (3 mLs Nebulization Given 12/28/20 1737)  ipratropium-albuterol (DUONEB) 0.5-2.5 (3) MG/3ML nebulizer solution 3 mL (3 mLs Nebulization Given 12/28/20 1827)  methylPREDNISolone sodium succinate (SOLU-MEDROL) 125 mg/2 mL injection 125 mg (125 mg Intramuscular Given 12/28/20 1823)    ED Course  I have reviewed the triage vital signs and the nursing notes.  Pertinent labs & imaging results that were available during my care of the patient were reviewed by me and considered in my medical decision making (see chart for details).    MDM Rules/Calculators/A&P                         80 year old female with cough, wheezing.  Nonproductive cough.  Vital signs stable, afebrile with normal O2 sats.  CBC, BMP EKG normal.  Patient given 2 breathing treatments and Solu-Medrol, wheezing resolved.  She is started on antibiotic for sinusitis, given prednisone albuterol for bronchitis.  She understands signs and symptoms return to the ER for Final Clinical Impression(s) / ED Diagnoses Final diagnoses:  Cough  Bronchitis  Acute non-recurrent maxillary sinusitis    Rx / DC Orders ED Discharge Orders          Ordered    albuterol (VENTOLIN HFA) 108 (90 Base) MCG/ACT inhaler  Every 6 hours PRN        12/28/20 2050    predniSONE (DELTASONE) 10 MG tablet  Daily        12/28/20 2050    doxycycline (ADOXA) 100 MG tablet  2 times daily        12/28/20 2050             Renata Caprice 12/28/20 2057    Delman Kitten, MD 12/28/20 2117

## 2020-12-28 NOTE — ED Notes (Signed)
[facility] to pick up pt.

## 2020-12-28 NOTE — ED Notes (Signed)
[facility] staff notified that patient is being discharged back to their facility at this time. Patient transported to [facility] by [employer].

## 2020-12-28 NOTE — ED Notes (Signed)
[facility] notified of patient discharge to their facility.

## 2020-12-28 NOTE — ED Notes (Signed)
Occasional "self induced" wheezing noted, clears without cough or intervention.

## 2020-12-28 NOTE — ED Triage Notes (Signed)
Pt here via EMS from home with c/o worsening cough, wheezing and shob over the past week. Pt presents to triage with oxygen intact, 2L per [place], no home use. NAD. Bringing up some mucus with cough occasionally.

## 2021-01-08 ENCOUNTER — Emergency Department: Payer: Medicare Other

## 2021-01-08 ENCOUNTER — Inpatient Hospital Stay
Admission: EM | Admit: 2021-01-08 | Discharge: 2021-01-11 | DRG: 152 | Disposition: A | Discharge status: Skilled Nursing Facility | Payer: Medicare Other | Source: Skilled Nursing Facility | Attending: Internal Medicine | Admitting: Internal Medicine

## 2021-01-08 ENCOUNTER — Other Ambulatory Visit: Payer: Self-pay

## 2021-01-08 ENCOUNTER — Encounter: Payer: Self-pay | Admitting: Emergency Medicine

## 2021-01-08 DIAGNOSIS — Z9842 Cataract extraction status, left eye: Secondary | ICD-10-CM

## 2021-01-08 DIAGNOSIS — C50912 Malignant neoplasm of unspecified site of left female breast: Secondary | ICD-10-CM | POA: Diagnosis present

## 2021-01-08 DIAGNOSIS — Z79811 Long term (current) use of aromatase inhibitors: Secondary | ICD-10-CM

## 2021-01-08 DIAGNOSIS — Z20822 Contact with and (suspected) exposure to covid-19: Secondary | ICD-10-CM | POA: Diagnosis present

## 2021-01-08 DIAGNOSIS — J9811 Atelectasis: Secondary | ICD-10-CM | POA: Diagnosis not present

## 2021-01-08 DIAGNOSIS — J90 Pleural effusion, not elsewhere classified: Secondary | ICD-10-CM | POA: Diagnosis present

## 2021-01-08 DIAGNOSIS — I959 Hypotension, unspecified: Secondary | ICD-10-CM | POA: Diagnosis not present

## 2021-01-08 DIAGNOSIS — R5383 Other fatigue: Secondary | ICD-10-CM | POA: Diagnosis present

## 2021-01-08 DIAGNOSIS — Z803 Family history of malignant neoplasm of breast: Secondary | ICD-10-CM

## 2021-01-08 DIAGNOSIS — Z66 Do not resuscitate: Secondary | ICD-10-CM | POA: Diagnosis present

## 2021-01-08 DIAGNOSIS — F32A Depression, unspecified: Secondary | ICD-10-CM | POA: Diagnosis present

## 2021-01-08 DIAGNOSIS — R053 Chronic cough: Secondary | ICD-10-CM | POA: Diagnosis present

## 2021-01-08 DIAGNOSIS — J9601 Acute respiratory failure with hypoxia: Secondary | ICD-10-CM | POA: Diagnosis not present

## 2021-01-08 DIAGNOSIS — F419 Anxiety disorder, unspecified: Secondary | ICD-10-CM | POA: Diagnosis present

## 2021-01-08 DIAGNOSIS — R1084 Generalized abdominal pain: Secondary | ICD-10-CM | POA: Diagnosis not present

## 2021-01-08 DIAGNOSIS — J069 Acute upper respiratory infection, unspecified: Secondary | ICD-10-CM | POA: Diagnosis not present

## 2021-01-08 DIAGNOSIS — M1710 Unilateral primary osteoarthritis, unspecified knee: Secondary | ICD-10-CM | POA: Diagnosis present

## 2021-01-08 DIAGNOSIS — K449 Diaphragmatic hernia without obstruction or gangrene: Secondary | ICD-10-CM | POA: Diagnosis not present

## 2021-01-08 DIAGNOSIS — Z9841 Cataract extraction status, right eye: Secondary | ICD-10-CM

## 2021-01-08 DIAGNOSIS — K589 Irritable bowel syndrome without diarrhea: Secondary | ICD-10-CM | POA: Diagnosis present

## 2021-01-08 DIAGNOSIS — Z79890 Hormone replacement therapy (postmenopausal): Secondary | ICD-10-CM

## 2021-01-08 DIAGNOSIS — R52 Pain, unspecified: Secondary | ICD-10-CM | POA: Diagnosis not present

## 2021-01-08 DIAGNOSIS — Z882 Allergy status to sulfonamides status: Secondary | ICD-10-CM

## 2021-01-08 DIAGNOSIS — Z79899 Other long term (current) drug therapy: Secondary | ICD-10-CM

## 2021-01-08 DIAGNOSIS — Z7952 Long term (current) use of systemic steroids: Secondary | ICD-10-CM

## 2021-01-08 DIAGNOSIS — E785 Hyperlipidemia, unspecified: Secondary | ICD-10-CM | POA: Diagnosis present

## 2021-01-08 DIAGNOSIS — R0902 Hypoxemia: Secondary | ICD-10-CM | POA: Diagnosis not present

## 2021-01-08 DIAGNOSIS — R0602 Shortness of breath: Secondary | ICD-10-CM | POA: Diagnosis present

## 2021-01-08 DIAGNOSIS — C3490 Malignant neoplasm of unspecified part of unspecified bronchus or lung: Secondary | ICD-10-CM | POA: Diagnosis present

## 2021-01-08 DIAGNOSIS — K219 Gastro-esophageal reflux disease without esophagitis: Secondary | ICD-10-CM | POA: Diagnosis present

## 2021-01-08 DIAGNOSIS — R001 Bradycardia, unspecified: Secondary | ICD-10-CM | POA: Diagnosis not present

## 2021-01-08 DIAGNOSIS — R630 Anorexia: Secondary | ICD-10-CM | POA: Diagnosis present

## 2021-01-08 DIAGNOSIS — M1712 Unilateral primary osteoarthritis, left knee: Secondary | ICD-10-CM | POA: Diagnosis present

## 2021-01-08 DIAGNOSIS — I350 Nonrheumatic aortic (valve) stenosis: Secondary | ICD-10-CM

## 2021-01-08 DIAGNOSIS — M179 Osteoarthritis of knee, unspecified: Secondary | ICD-10-CM | POA: Diagnosis present

## 2021-01-08 DIAGNOSIS — M8580 Other specified disorders of bone density and structure, unspecified site: Secondary | ICD-10-CM | POA: Diagnosis present

## 2021-01-08 DIAGNOSIS — F339 Major depressive disorder, recurrent, unspecified: Secondary | ICD-10-CM | POA: Diagnosis present

## 2021-01-08 DIAGNOSIS — I700 Atherosclerosis of aorta: Secondary | ICD-10-CM | POA: Diagnosis present

## 2021-01-08 DIAGNOSIS — R911 Solitary pulmonary nodule: Secondary | ICD-10-CM | POA: Diagnosis not present

## 2021-01-08 DIAGNOSIS — R059 Cough, unspecified: Secondary | ICD-10-CM | POA: Diagnosis not present

## 2021-01-08 DIAGNOSIS — Z9049 Acquired absence of other specified parts of digestive tract: Secondary | ICD-10-CM

## 2021-01-08 DIAGNOSIS — M797 Fibromyalgia: Secondary | ICD-10-CM | POA: Diagnosis present

## 2021-01-08 DIAGNOSIS — E039 Hypothyroidism, unspecified: Secondary | ICD-10-CM | POA: Diagnosis present

## 2021-01-08 DIAGNOSIS — Z961 Presence of intraocular lens: Secondary | ICD-10-CM | POA: Diagnosis present

## 2021-01-08 LAB — COMPREHENSIVE METABOLIC PANEL WITH GFR
ALT: 38 U/L (ref 0–44)
AST: 40 U/L (ref 15–41)
Albumin: 2.8 g/dL — ABNORMAL LOW (ref 3.5–5.0)
Alkaline Phosphatase: 70 U/L (ref 38–126)
Anion gap: 9 (ref 5–15)
BUN: 29 mg/dL — ABNORMAL HIGH (ref 8–23)
CO2: 26 mmol/L (ref 22–32)
Calcium: 7.6 mg/dL — ABNORMAL LOW (ref 8.9–10.3)
Chloride: 98 mmol/L (ref 98–111)
Creatinine, Ser: 1.23 mg/dL — ABNORMAL HIGH (ref 0.44–1.00)
GFR, Estimated: 44 mL/min — ABNORMAL LOW
Glucose, Bld: 139 mg/dL — ABNORMAL HIGH (ref 70–99)
Potassium: 4.1 mmol/L (ref 3.5–5.1)
Sodium: 133 mmol/L — ABNORMAL LOW (ref 135–145)
Total Bilirubin: 0.7 mg/dL (ref 0.3–1.2)
Total Protein: 5.3 g/dL — ABNORMAL LOW (ref 6.5–8.1)

## 2021-01-08 LAB — URINALYSIS, COMPLETE (UACMP) WITH MICROSCOPIC
Bacteria, UA: NONE SEEN
Bilirubin Urine: NEGATIVE
Glucose, UA: NEGATIVE mg/dL
Hgb urine dipstick: NEGATIVE
Ketones, ur: NEGATIVE mg/dL
Leukocytes,Ua: NEGATIVE
Nitrite: NEGATIVE
Protein, ur: NEGATIVE mg/dL
Specific Gravity, Urine: 1.021 (ref 1.005–1.030)
Squamous Epithelial / HPF: NONE SEEN (ref 0–5)
pH: 7 (ref 5.0–8.0)

## 2021-01-08 LAB — CBC
HCT: 34.5 % — ABNORMAL LOW (ref 36.0–46.0)
Hemoglobin: 11.5 g/dL — ABNORMAL LOW (ref 12.0–15.0)
MCH: 31.9 pg (ref 26.0–34.0)
MCHC: 33.3 g/dL (ref 30.0–36.0)
MCV: 95.6 fL (ref 80.0–100.0)
Platelets: 336 10*3/uL (ref 150–400)
RBC: 3.61 MIL/uL — ABNORMAL LOW (ref 3.87–5.11)
RDW: 13.8 % (ref 11.5–15.5)
WBC: 13.9 10*3/uL — ABNORMAL HIGH (ref 4.0–10.5)
nRBC: 0 % (ref 0.0–0.2)

## 2021-01-08 LAB — RESP PANEL BY RT-PCR (FLU A&B, COVID) ARPGX2
Influenza A by PCR: NEGATIVE
Influenza B by PCR: NEGATIVE
SARS Coronavirus 2 by RT PCR: NEGATIVE

## 2021-01-08 LAB — LIPASE, BLOOD: Lipase: 38 U/L (ref 11–51)

## 2021-01-08 LAB — LACTIC ACID, PLASMA
Lactic Acid, Venous: 1.3 mmol/L (ref 0.5–1.9)
Lactic Acid, Venous: 1.4 mmol/L (ref 0.5–1.9)

## 2021-01-08 LAB — BRAIN NATRIURETIC PEPTIDE: B Natriuretic Peptide: 22.8 pg/mL (ref 0.0–100.0)

## 2021-01-08 LAB — TROPONIN I (HIGH SENSITIVITY): Troponin I (High Sensitivity): 18 ng/L — ABNORMAL HIGH (ref <18)

## 2021-01-08 LAB — PROCALCITONIN: Procalcitonin: <0.1 ng/mL

## 2021-01-08 MED ORDER — IPRATROPIUM-ALBUTEROL 0.5-2.5 (3) MG/3ML IN SOLN
3.0000 mL | Freq: Three times a day (TID) | RESPIRATORY_TRACT | Status: DC
  Administered 2021-01-08 – 2021-01-09 (×3): 3 mL via RESPIRATORY_TRACT
  Filled 2021-01-08 (×3): qty 3

## 2021-01-08 MED ORDER — SODIUM CHLORIDE 0.9 % IV BOLUS
1000.0000 mL | Freq: Once | INTRAVENOUS | Status: AC
  Administered 2021-01-08: 1000 mL via INTRAVENOUS

## 2021-01-08 MED ORDER — ONDANSETRON HCL 4 MG PO TABS: 4.0000 mg | ORAL_TABLET | Freq: Four times a day (QID) | ORAL | Status: DC | PRN

## 2021-01-08 MED ORDER — BUPROPION HCL ER (XL) 150 MG PO TB24
150.0000 mg | ORAL_TABLET | Freq: Every day | ORAL | Status: DC
  Administered 2021-01-09 – 2021-01-11 (×3): 150 mg via ORAL
  Filled 2021-01-08 (×3): qty 1

## 2021-01-08 MED ORDER — IPRATROPIUM-ALBUTEROL 0.5-2.5 (3) MG/3ML IN SOLN
3.0000 mL | Freq: Once | RESPIRATORY_TRACT | Status: AC
  Administered 2021-01-08: 3 mL via RESPIRATORY_TRACT
  Filled 2021-01-08: qty 3

## 2021-01-08 MED ORDER — POLYETHYLENE GLYCOL 3350 17 G PO PACK: 17.0000 g | PACK | Freq: Every day | ORAL | Status: DC | PRN

## 2021-01-08 MED ORDER — LUBIPROSTONE 24 MCG PO CAPS
24.0000 ug | ORAL_CAPSULE | Freq: Two times a day (BID) | ORAL | Status: DC
  Administered 2021-01-09 – 2021-01-11 (×5): 24 ug via ORAL
  Filled 2021-01-08 (×8): qty 1

## 2021-01-08 MED ORDER — ACETAMINOPHEN 650 MG RE SUPP: 650.0000 mg | Freq: Four times a day (QID) | RECTAL | Status: AC | PRN

## 2021-01-08 MED ORDER — PANTOPRAZOLE SODIUM 40 MG PO TBEC
40.0000 mg | DELAYED_RELEASE_TABLET | Freq: Every morning | ORAL | Status: DC
  Administered 2021-01-09 – 2021-01-11 (×3): 40 mg via ORAL
  Filled 2021-01-08 (×3): qty 1

## 2021-01-08 MED ORDER — OXYMETAZOLINE HCL 0.05 % NA SOLN
1.0000 | Freq: Once | NASAL | Status: AC
  Administered 2021-01-08: 1 via NASAL
  Filled 2021-01-08: qty 30

## 2021-01-08 MED ORDER — ALBUTEROL SULFATE (2.5 MG/3ML) 0.083% IN NEBU: 2.5000 mg | INHALATION_SOLUTION | Freq: Four times a day (QID) | RESPIRATORY_TRACT | Status: DC | PRN

## 2021-01-08 MED ORDER — ANASTROZOLE 1 MG PO TABS
1.0000 mg | ORAL_TABLET | Freq: Every day | ORAL | Status: DC
  Administered 2021-01-08 – 2021-01-11 (×4): 1 mg via ORAL
  Filled 2021-01-08 (×5): qty 1

## 2021-01-08 MED ORDER — VENLAFAXINE HCL ER 75 MG PO CP24
150.0000 mg | ORAL_CAPSULE | Freq: Every morning | ORAL | Status: DC
  Administered 2021-01-09 – 2021-01-11 (×3): 150 mg via ORAL
  Filled 2021-01-08: qty 1
  Filled 2021-01-08 (×3): qty 2

## 2021-01-08 MED ORDER — ONDANSETRON HCL 4 MG/2ML IJ SOLN
4.0000 mg | Freq: Four times a day (QID) | INTRAMUSCULAR | Status: DC | PRN
Start: 2021-01-08 — End: 2021-01-11

## 2021-01-08 MED ORDER — IOHEXOL 350 MG/ML SOLN
60.0000 mL | Freq: Once | INTRAVENOUS | Status: AC | PRN
  Administered 2021-01-08: 60 mL via INTRAVENOUS

## 2021-01-08 MED ORDER — ADULT MULTIVITAMIN W/MINERALS CH
1.0000 | ORAL_TABLET | Freq: Every day | ORAL | Status: DC
  Administered 2021-01-09 – 2021-01-11 (×3): 1 via ORAL
  Filled 2021-01-08 (×3): qty 1

## 2021-01-08 MED ORDER — MEMANTINE HCL 5 MG PO TABS
10.0000 mg | ORAL_TABLET | Freq: Two times a day (BID) | ORAL | Status: DC
  Administered 2021-01-08 – 2021-01-11 (×6): 10 mg via ORAL
  Filled 2021-01-08 (×6): qty 2

## 2021-01-08 MED ORDER — MIRABEGRON ER 50 MG PO TB24
50.0000 mg | ORAL_TABLET | Freq: Every day | ORAL | Status: DC
  Administered 2021-01-09 – 2021-01-11 (×3): 50 mg via ORAL
  Filled 2021-01-08 (×3): qty 1

## 2021-01-08 MED ORDER — FLUTICASONE PROPIONATE 50 MCG/ACT NA SUSP
2.0000 | Freq: Every day | NASAL | Status: DC
  Administered 2021-01-09 – 2021-01-11 (×3): 2 via NASAL
  Filled 2021-01-08: qty 16

## 2021-01-08 MED ORDER — ACETAMINOPHEN 325 MG PO TABS: 650.0000 mg | ORAL_TABLET | Freq: Four times a day (QID) | ORAL | Status: AC | PRN

## 2021-01-08 MED ORDER — ONDANSETRON HCL 4 MG/2ML IJ SOLN
4.0000 mg | Freq: Once | INTRAMUSCULAR | Status: AC
  Administered 2021-01-08: 4 mg via INTRAVENOUS
  Filled 2021-01-08: qty 2

## 2021-01-08 MED ORDER — VITAMIN D (ERGOCALCIFEROL) 1.25 MG (50000 UNIT) PO CAPS: 50000.0000 [IU] | ORAL_CAPSULE | ORAL | Status: DC

## 2021-01-08 MED ORDER — DM-GUAIFENESIN ER 30-600 MG PO TB12: 1.0000 | ORAL_TABLET | Freq: Two times a day (BID) | ORAL | Status: DC | PRN

## 2021-01-08 MED ORDER — ENOXAPARIN SODIUM 40 MG/0.4ML IJ SOSY
40.0000 mg | PREFILLED_SYRINGE | INTRAMUSCULAR | Status: DC
  Administered 2021-01-08 – 2021-01-10 (×3): 40 mg via SUBCUTANEOUS
  Filled 2021-01-08 (×3): qty 0.4

## 2021-01-08 MED ORDER — LEVOTHYROXINE SODIUM 25 MCG PO TABS
25.0000 ug | ORAL_TABLET | Freq: Every day | ORAL | Status: DC
  Administered 2021-01-09 – 2021-01-11 (×3): 25 ug via ORAL
  Filled 2021-01-08 (×3): qty 1

## 2021-01-08 MED ORDER — GUAIFENESIN-DM 100-10 MG/5ML PO SYRP: 10.0000 mL | ORAL_SOLUTION | Freq: Four times a day (QID) | ORAL | Status: DC | PRN

## 2021-01-08 MED ORDER — ALBUTEROL SULFATE HFA 108 (90 BASE) MCG/ACT IN AERS: 2.0000 | INHALATION_SPRAY | Freq: Four times a day (QID) | RESPIRATORY_TRACT | Status: DC | PRN

## 2021-01-08 MED ORDER — METHYLPREDNISOLONE SODIUM SUCC 40 MG IJ SOLR
40.0000 mg | Freq: Two times a day (BID) | INTRAMUSCULAR | Status: DC
  Administered 2021-01-08 – 2021-01-10 (×5): 40 mg via INTRAVENOUS
  Filled 2021-01-08 (×5): qty 1

## 2021-01-08 MED ORDER — DM-GUAIFENESIN ER 30-600 MG PO TB12: 1.0000 | ORAL_TABLET | Freq: Four times a day (QID) | ORAL | Status: DC | PRN

## 2021-01-08 MED ORDER — POLYETHYLENE GLYCOL 3350 17 G PO PACK
17.0000 g | PACK | Freq: Two times a day (BID) | ORAL | Status: DC
  Administered 2021-01-08 – 2021-01-11 (×6): 17 g via ORAL
  Filled 2021-01-08 (×6): qty 1

## 2021-01-08 MED ORDER — FAMOTIDINE IN NACL 20-0.9 MG/50ML-% IV SOLN
20.0000 mg | Freq: Once | INTRAVENOUS | Status: AC
  Administered 2021-01-08: 20 mg via INTRAVENOUS
  Filled 2021-01-08: qty 50

## 2021-01-08 MED ORDER — HYDROCOD POLST-CPM POLST ER 10-8 MG/5ML PO SUER
5.0000 mL | Freq: Every day | ORAL | Status: AC
Start: 2021-01-08 — End: 2021-01-10
  Administered 2021-01-08 – 2021-01-10 (×3): 5 mL via ORAL
  Filled 2021-01-08 (×3): qty 5

## 2021-01-08 NOTE — H&P (Addendum)
History and Physical   Jamie Matthews BDZ:[id number] DOB: [birth year] DOA: 01/08/2021  PCP: [name], MD  Outpatient Specialists: Dr. Tasia Catchings, medical oncology Patient coming from: [facility]  I have personally briefly reviewed patient's old medical records in [place].  Chief Concern: Congestion  HPI: Jamie Matthews is a 80 y.o. female with medical history significant for left breast cancer stage Ib, primary adenocarcinoma of the lung stage IVa, hypothyroid, acid reflux, hyperlipidemia, depression, memory decline, aortic atherosclerosis, presents emergency department for chief concerns of weakness and hypoxia via EMS.  At bedside patient was able to tell me her name, her age, location of hospital.  She endorses epigastric and mid abdominal pain with cough. She also endorses consitpation and that she has not had a bowel movement in 4 days. She denies nausea, vomiting, shortness of breath, chest pain.   She denies fever, known sick contacts. She reports she cough more int he evening and it is white saliva and/or white mucus that she coughs up. She and her husband recently went to [place] and came back 8/11-8/14. The congestion and cough started two days ago upon their return.  She also reports that during her trip to [facility], she did not bring her MiraLAX and she has been constipated since then.  Of note, she reports that she stopped taking the crizotinib because she didn't know it was for her lung cancer.   Social history: She lives at [facility] with her husband. She denies history of tobacco use and/or exposure to tobacco smoke from home. 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 She denies recreatioanl drug use. Her last etoh drink was about 1 year ago.  Vaccination history: She reports she has received two doses of Moderna.  ROS: Constitutional: no weight change, no fever ENT/Mouth: no sore throat, no  rhinorrhea Eyes: no eye pain, no vision changes Cardiovascular: no chest pain, no dyspnea,  no edema, no palpitations Respiratory: no cough, no sputum, no wheezing Gastrointestinal: no nausea, no vomiting, no diarrhea, no constipation Genitourinary: no urinary incontinence, no dysuria, no hematuria Musculoskeletal: no arthralgias, no myalgias Skin: no skin lesions, no pruritus, Neuro: + weakness, no loss of consciousness, no syncope Psych: no anxiety, no depression, + decrease appetite Heme/Lymph: no bruising, no bleeding  ED Course: Discussed with emergency medicine provider, patient requiring hospitalization for chief concerns of hypoxia.  Initial vitals in the ED was remarkable for temperature 99.1, respiration rate of 16, heart rate 62, blood pressure 112/60, SPO2 of 95% on 2 L nasal cannula.  Labs on admission was remarkable for sodium 133, potassium 4.1, chloride 98, bicarb 26, BUN 29, serum creatinine of 1.23, nonfasting blood glucose 139, WBC 13.9, hemoglobin 11.5, platelets 336, EGFR 44.  BNP was 22.8.  Troponin high-sensitivity was 18.  COVID/influenza A/influenza B PCR were negative.  Per ED note, patient is from [facility] and had abdominal pain for 3 days.  When EMS/first responder arrived, patient was hypoxic with SPO2 in the high 80s.  Patient was placed on 4 L nasal cannula by EMS.  ED provider ordered a CTA of the chest to assess for pulmonary embolism-this was read as no evidence of PE, small new bilateral pleural effusions, new moderate hiatal hernia and diffusely distended esophagus with air-fluid level, suggestive of gastroesophageal reflux; unchanged 2.3 cm faintly calcified pulmonary nodule in the apical left lung.  Aortic atherosclerosis  In the emergency department patient was treated with  1 treatment of duo nebs, ondansetron 4 mg IV, oxymetazoline (Afrin), Pepcid 20 mg IV, normal saline 1 L bolus.  Assessment/Plan  Principal Problem:   Shortness of  breath Active Problems:   Gastroesophageal reflux disease   Hyperlipidemia   OA (osteoarthritis) of knee   Chronic cough   Fatigue   Depression, recurrent (HCC)   Decreased appetite   IBS (irritable bowel syndrome)   Primary adenocarcinoma of lung (HCC)   Malignant neoplasm of left female breast (HCC)   Aromatase inhibitor use   Aortic valve stenosis   # Hypoxia and shortness of breath-I suspect this is secondary to upper respiratory infection - Status post DuoNeb 1 treatment per EDP - DuoNeb 3 times daily ordered - Albuterol 2.5 mg nebulizer every 6 hours as needed for wheezing and shortness of breath  # Leukocytosis-UA, procalcitonin is pending; blood cultures x2 in process - However as leukocytosis is mild, this may be secondary to acute on chronic constipation - CBC in the a.m.  # History of constipation-currently constipated as patient has not taken MiraLAX for the last 5 days - MiraLAX 17 g p.o. daily twice daily scheduled  # Does not meet criteria for sepsis-EDP did order blood cultures x2  # GERD-PPI, pantoprazole 40 mg every morning resumed  # Hypothyroid-levothyroxine 25 mcg daily resumed  # History of left breast cancer, stage Ib-resumed home dosing of Arimidex 1 mg daily  # Primary adenocarcinoma of the lung, stage IVa, on crizotinib - Discussed with Dr. Tasia Catchings via secure chat who recommended that since patient has not been taking her crizotinib, okay to hold the medication at this time while inpatient.  Medical oncology will arrange for outpatient follow-up.  # Hyperlipidemia -patient states she does not take atorvastatin 20 mg daily, this has not been resumed  # Depression/anxiety-resumed bupropion 150 mg daily  # History of memory decline-Namenda 10 mg twice daily resumed  # Aortic atherosclerosis-outpatient follow-up  BMP, CBC in the a.m.  COVID is negative  Med reconciliation is complete  Chart reviewed.   DVT prophylaxis: Enoxaparin 40 mg  subcutaneous every 24 hours Code Status: DNR Diet: Heart healthy Family Communication: No Disposition Plan: Pending clinical course Consults called: No Admission status: MedSurg, observation, no telemetry at this time  Past Medical History:  Diagnosis Date   AKI (acute kidney injury) ([place]) 08/17/2018   Anxiety    Arthritis    Belching    Bladder disorder    OVERACTIVE   Bowel dysfunction    BLOCKAGE   Cancer ([place])    breast   Constipation    Depression    Diverticulitis    Fibromyalgia    GERD (gastroesophageal reflux disease)    Hyperlipidemia    IBS (irritable bowel syndrome)    Internal hemorrhoids    Lung cancer ([place]) 2020   Memory deficits    Murmur    asymptomatic   Pneumonia 11/18/12   Urinary incontinence    Vertigo    Past Surgical History:  Procedure Laterality Date   AXILLARY LYMPH NODE BIOPSY Left 06/08/2018   INVASIVE MAMMARY CARCINOMA   BLADDER SUSPENSION  2004, 2012   BREAST BIOPSY Left 06/08/2018   INVASIVE MAMMARY CARCINOMA   CATARACT EXTRACTION W/PHACO Right 08/27/2015   Procedure: CATARACT EXTRACTION PHACO AND INTRAOCULAR LENS PLACEMENT ([place]);  Surgeon: Estill Cotta, MD;  Location: [facility] ORS;  Service: Ophthalmology;  Laterality: Right;  [place]   1:00.2 AP%  22.5 CDE  23.67 fluid casette lot #[id number]  exp05/31/2018  CATARACT EXTRACTION W/PHACO Left 10/15/2015   Procedure: CATARACT EXTRACTION PHACO AND INTRAOCULAR LENS PLACEMENT ([facility]);  Surgeon: Estill Cotta, MD;  Location: [facility] ORS;  Service: Ophthalmology;  Laterality: Left;  [place] 01:07 AP% 18.1 CDE 21.57 fluid pack lot # [id number]   CHOLECYSTECTOMY     COLONOSCOPY  2017   ELECTROMAGNETIC NAVIGATION BROCHOSCOPY N/A 07/09/2018   Procedure: ELECTROMAGNETIC NAVIGATION BRONCHOSCOPY;  Surgeon: Flora Lipps, MD;  Location: [facility] ORS;  Service: Cardiopulmonary;  Laterality: N/A;   TONSILLECTOMY  1947   Social History:  reports that she has never smoked. She has never used smokeless tobacco. She  reports that she does not currently use alcohol. She reports that she does not use drugs.  Allergies  Allergen Reactions   Sulfa Antibiotics Rash   Family History  Problem Relation Age of Onset   Diabetes Father    Heart disease Father    Lymphoma Father    Heart disease Mother    Breast cancer Sister 33   Colon cancer Neg Hx    Esophageal cancer Neg Hx    Rectal cancer Neg Hx    Stomach cancer Neg Hx    Bladder Cancer Neg Hx    Kidney cancer Neg Hx    Family history: Family history reviewed and pertinent for heart disease in the mother, heart disease in the father and breast cancer in a sister.  Prior to Admission medications   Medication Sig Start Date End Date Taking? Authorizing Provider  acetaminophen (TYLENOL) 325 MG tablet Take 325 mg by mouth every 6 (six) hours as needed for mild pain.     [provider]  albuterol (VENTOLIN HFA) 108 (90 Base) MCG/ACT inhaler Inhale 2 puffs into the lungs every 6 (six) hours as needed for wheezing or shortness of breath. 12/28/20   Duanne Guess, PA-C  anastrozole (ARIMIDEX) 1 MG tablet Take 1 tablet (1 mg total) by mouth daily. 06/25/20   Earlie Server, MD  atorvastatin (LIPITOR) 20 MG tablet TAKE 1 TABLET BY MOUTH NIGHTLY Patient not taking: Reported on 11/27/2020 02/03/20   [name], MD  benzonatate (TESSALON) 100 MG capsule TAKE 1 CAPSULE BY MOUTH 3 TIMES DAILY ASNEEDED FOR COUGH. 12/10/20   [name], MD  buPROPion (WELLBUTRIN XL) 150 MG 24 hr tablet TAKE ONE TABLET BY MOUTH EVERY DAY 09/29/18   Lucille Passy, MD  Calcium-Magnesium-Vitamin D (CALCIUM 1200+D3 PO) Take 1 tablet by mouth daily.    [provider]  crizotinib Hulda Humphrey) 250 MG capsule TAKE 1 CAPSULE (250 MG TOTAL) BY MOUTH 2 TIMES DAILY. Patient not taking: Reported on 11/27/2020 05/17/20 05/17/21  Earlie Server, MD  desonide (DESOWEN) 0.05 % lotion Apply 1 application topically as needed (after showering).    [provider]   Dextromethorphan-guaiFENesin (MUCINEX DM) 30-600 MG TB12 Take 1 tablet by mouth 2 (two) times daily as needed (for congestion/cough).    [provider]  docusate sodium (COLACE) 100 MG capsule TAKE 1 CAPSULE BY MOUTH DAILY 10/16/20   Earlie Server, MD  doxycycline (MONODOX) 100 MG capsule Take 100 mg by mouth 2 (two) times daily. 12/29/20   [provider]  fluticasone (FLONASE) 50 MCG/ACT nasal spray Place 2 sprays into both nostrils daily. 04/03/20   [name], MD  ketoconazole (NIZORAL) 2 % shampoo Apply 1 application topically as needed. 3 times a week 04/20/18   [provider]  levothyroxine (SYNTHROID) 25 MCG tablet TAKE 1 TABLET BY MOUTH DAILY 02/29/20   Einar Pheasant,  Jobe Marker, MD  lubiprostone (AMITIZA) 24 MCG capsule TAKE ONE CAPSULE TWICE A DAY WITH MEALS 02/09/18   Pyrtle, Lajuan Lines, MD  memantine (NAMENDA) 10 MG tablet Take 1 tablet (10 mg total) by mouth 2 (two) times daily. 10/19/20   [name], MD  mirabegron ER (MYRBETRIQ) 50 MG TB24 tablet Take 1 tablet (50 mg total) by mouth daily. For overactive bladder. 11/02/20   Pleas Koch, NP  Multiple Vitamin (MULTIVITAMIN WITH MINERALS) TABS tablet Take 1 tablet by mouth daily. Patient not taking: Reported on 11/27/2020    [provider]  omeprazole (PRILOSEC) 40 MG capsule Take 40 mg by mouth daily.    [provider]  ondansetron (ZOFRAN) 8 MG tablet TAKE ONE TABLET EVERY EIGHT HOURS AS NEEDED FOR NAUSEA / VOMITING Patient not taking: Reported on 11/27/2020 10/19/20   Earlie Server, MD  polyethylene glycol [facility] / Floria Raveling) packet Take 17 g by mouth daily as needed.    [provider]  predniSONE (DELTASONE) 10 MG tablet Take 1 tablet (10 mg total) by mouth daily. 6,5,4,3,2,1 six day taper 12/28/20   Duanne Guess, PA-C  venlafaxine XR (EFFEXOR-XR) 150 MG 24 hr capsule TAKE 1 CAPSULE BY MOUTH EVERY DAY 12/01/19   Harold Hedge, MD  Vitamin D, Ergocalciferol, (DRISDOL) 1.25 MG (50000 UNIT)  CAPS capsule Take 50,000 Units by mouth once a week. 12/20/19   [provider]   Physical Exam: Vitals:   01/08/21 0840 01/08/21 1100 01/08/21 1130 01/08/21 1230  BP: 112/60 (!) 139/50 140/60 (!) 150/93  Pulse: 62 66 60 62  Resp: 16 (P) '19 19 20  ' Temp: 99.1 F (37.3 C)     TempSrc: Oral     SpO2: 95% 98% 100% 100%  Weight:      Height:       Constitutional: appears age-appropriate, frail, NAD, calm, comfortable Eyes: PERRL, lids and conjunctivae normal ENMT: Mucous membranes are moist. Posterior pharynx clear of any exudate or lesions. Age-appropriate dentition. Hearing appropriate Neck: normal, supple, no masses, no thyromegaly Respiratory: clear to auscultation bilaterally, no wheezing, no crackles. Normal respiratory effort. No accessory muscle use.  Cardiovascular: Regular rate and rhythm, no murmurs / rubs / gallops. No extremity edema. 2+ pedal pulses. No carotid bruits.  Abdomen: Obese abdomen, no tenderness, no masses palpated, no hepatosplenomegaly.  No bowel sounds auscultated in setting of constipation. Musculoskeletal: no clubbing / cyanosis. No joint deformity upper and lower extremities. Good ROM, no contractures, no atrophy. Normal muscle tone.  Skin: no rashes, lesions, ulcers. No induration Neurologic: Sensation intact. Strength 5/5 in all 4.  Psychiatric: Normal judgment and insight. Alert and oriented x 3. Normal mood.   EKG: independently reviewed, showing sinus bradycardia with rate of 59, QTc 439  Chest x-ray on Admission: I personally reviewed and I agree with radiologist reading as below.  CT Angio Chest PE W and/or Wo Contrast  Result Date: 01/08/2021 CLINICAL DATA:  Cough, congestion, shortness of breath. History of lung cancer and breast cancer. EXAM: CT ANGIOGRAPHY CHEST WITH CONTRAST TECHNIQUE: Multidetector CT imaging of the chest was performed using the standard protocol during bolus administration of intravenous contrast. Multiplanar CT image  reconstructions and MIPs were obtained to evaluate the vascular anatomy. CONTRAST:  12m OMNIPAQUE IOHEXOL 350 MG/ML SOLN COMPARISON:  CT chest dated September 21, 2020. FINDINGS: Cardiovascular: Satisfactory opacification of the pulmonary arteries to the segmental level. No evidence of pulmonary embolism. Normal heart size. No pericardial effusion. No thoracic aortic  aneurysm or dissection. Mild atherosclerotic calcification of the aortic arch. Mediastinum/Nodes: No enlarged mediastinal, hilar, or axillary lymph nodes. Thyroid gland, and trachea demonstrate no significant findings. New moderate hiatal hernia and diffusely distended esophagus with air-fluid level. Lungs/Pleura: New small left greater than right pleural effusions. No consolidation or pneumothorax. Mild subsegmental atelectasis in the left lower lobe. Unchanged faintly calcified 2.3 x 2.0 cm nodule in the apical left upper lobe (series 7, image 20). Unchanged 5 mm nodule in the right middle lobe (series 7, image 39). Upper Abdomen: No acute abnormality. Large amount of stool in the visualized colon. Musculoskeletal: No chest wall abnormality. No acute or significant osseous findings. Chronic T9 and T11 compression deformities again noted. Multiple old right-sided rib fractures again noted. Review of the MIP images confirms the above findings. IMPRESSION: 1. No evidence of pulmonary embolism. 2. New small bilateral pleural effusions. 3. New moderate hiatal hernia and diffusely distended esophagus with air-fluid level, suggestive of gastroesophageal reflux. 4. Unchanged 2.3 cm faintly calcified solid pulmonary nodule in the apical left upper lobe. 5. Aortic Atherosclerosis (ICD10-I70.0). Electronically Signed   By: Titus Dubin M.D.   On: 01/08/2021 12:50   DG Chest Portable 1 View  Result Date: 01/08/2021 CLINICAL DATA:  Shortness of breath and cough EXAM: PORTABLE CHEST 1 VIEW COMPARISON:  Eleven days ago FINDINGS: Low volume chest. Lucency under  the right diaphragm has the appearance of colon. There is no edema, consolidation, effusion, or pneumothorax. Retrocardiac density is from tortuous aorta. Chronic nodular density at the left apex. IMPRESSION: Stable low volume chest.  No focal pneumonia. Electronically Signed   By: Monte Fantasia M.D.   On: 01/08/2021 11:25    Labs on Admission: I have personally reviewed following labs  CBC: Recent Labs  Lab 01/08/21 0837  WBC 13.9*  HGB 11.5*  HCT 34.5*  MCV 95.6  PLT 622   Basic Metabolic Panel: Recent Labs  Lab 01/08/21 0837  NA 133*  K 4.1  CL 98  CO2 26  GLUCOSE 139*  BUN 29*  CREATININE 1.23*  CALCIUM 7.6*   GFR: Estimated Creatinine Clearance: 31.7 mL/min (A) (by C-G formula based on SCr of 1.23 mg/dL (H)).  Liver Function Tests: Recent Labs  Lab 01/08/21 0837  AST 40  ALT 38  ALKPHOS 70  BILITOT 0.7  PROT 5.3*  ALBUMIN 2.8*   Recent Labs  Lab 01/08/21 0837  LIPASE 38   Urine analysis:    Component Value Date/Time   COLORURINE YELLOW (A) 10/10/2018 1548   APPEARANCEUR Clear 09/23/2019 1103   LABSPEC 1.006 10/10/2018 1548   LABSPEC 1.027 06/10/2014 1759   PHURINE 6.0 10/10/2018 1548   GLUCOSEU Negative 09/23/2019 1103   GLUCOSEU Negative 06/10/2014 1759   HGBUR NEGATIVE 10/10/2018 1548   BILIRUBINUR Negative 09/23/2019 1103   BILIRUBINUR Negative 06/10/2014 1759   KETONESUR NEGATIVE 10/10/2018 1548   PROTEINUR Negative 09/23/2019 1103   PROTEINUR NEGATIVE 10/10/2018 1548   UROBILINOGEN 0.2 08/20/2017 1134   NITRITE Negative 09/23/2019 1103   NITRITE NEGATIVE 10/10/2018 1548   LEUKOCYTESUR Negative 09/23/2019 1103   LEUKOCYTESUR NEGATIVE 10/10/2018 1548   LEUKOCYTESUR Trace 06/10/2014 1759   Dr. Tobie Poet [facility]  If 7PM-7AM, please contact overnight-coverage provider If 7AM-7PM, please contact day coverage provider [email or web address]  01/08/2021, 1:33 PM

## 2021-01-08 NOTE — ED Triage Notes (Signed)
Pt in via EMS from [facility] with c/o abd pain for 3 days. Pt also with lung and breast ca. Allergic to sulfa. 118/54, HR 56, 98% on 4L. Sats were in high 80's upon first responder arrival and placed on 4L.

## 2021-01-08 NOTE — ED Notes (Signed)
Pt at CT

## 2021-01-08 NOTE — ED Notes (Addendum)
Pt presents to ED with c/o of a dry cough and nasal congestion that has been ongoing for a few days. Pt denies fevers or chills.   Pt presents with a  wet depends and a strong smell of urine, pt denies urinary symptoms. Pt cleaned up, shorts placed in belongings bag, new brief and external purewick placed. Pt states intermittent lower ABD pain but states she is here for the SOB and congestion.   Pt does have audile wheezes throughout and does have some issues speaking in full sentences at this time, pt states HX of lung cancer but no COPD or Asthma.

## 2021-01-08 NOTE — Progress Notes (Signed)
Patient transported to room 128 via stretcher from ED. Patient is A/O x4 , on 2L of O2, external cathter in place. No belongings at bedside. Patient is coughing continuously but otherwise doesn't express any other discomfort. Per the transporter she communicated to this nurse that patient had coughed profusely after eating several grapes on the way up to the room. SLP consulted, will continue to monitor.

## 2021-01-08 NOTE — ED Notes (Signed)
The nurse from [facility], [place], called to check on pt status and also states pt is currently in the independent area of this facility and is currently in "respite care" and requests MD fills out of a FL2 so pt can be moved to the skilled side of their facility, Dr. Ellender Hose aware.

## 2021-01-08 NOTE — ED Provider Notes (Signed)
[facility] Emergency Department Provider Note  ____________________________________________   Event Date/Time   First MD Initiated Contact with Patient 01/08/21 1045     (approximate)  I have reviewed the triage vital signs and the nursing notes.   HISTORY  Chief Complaint Abdominal Pain and Nasal Congestion    HPI Jamie Matthews is a 80 y.o. female  with PMHx below here with cough, congestion, fatigue. Pt reports a several day history of progressively worsening cough, nasal congestion, and shortness of breath. This has been progressively worsening and she's had difficulty getting around the house over the past 24 hours. Denies known sick contacts. She's had some associated CP, SOB with coughing, as well as mild Abd pain (general). No vomiting, diarrhea. No known fevers, chills. No known sick contacts. Dyspnea is present at rest but worse w/ exertion. She's had associated wheezing. No current abd pain. No urinary symptoms.        Past Medical History:  Diagnosis Date   AKI (acute kidney injury) ([place]) 08/17/2018   Anxiety    Arthritis    Belching    Bladder disorder    OVERACTIVE   Bowel dysfunction    BLOCKAGE   Cancer (HCC)    breast   Constipation    Depression    Diverticulitis    Fibromyalgia    GERD (gastroesophageal reflux disease)    Hyperlipidemia    IBS (irritable bowel syndrome)    Internal hemorrhoids    Lung cancer ([place]) 2020   Memory deficits    Murmur    asymptomatic   Pneumonia 11/18/12   Urinary incontinence    Vertigo     Patient Active Problem List   Diagnosis Date Noted   Shortness of breath 01/08/2021   Aortic valve stenosis 01/19/2020   Left hip pain 12/05/2019   Hypocalcemia 11/02/2019   Trochanteric bursitis 09/20/2019   Pruritus 09/06/2019   Overactive bladder 09/06/2019   Chronic pain of right knee 09/06/2019   Multiple rib fractures 07/15/2019   Fall as cause of accidental injury at home  as place of occurrence 07/15/2019   Encounter for antineoplastic chemotherapy 12/17/2018   Lower leg edema 12/17/2018   Aromatase inhibitor use 10/24/2018   Osteopenia 10/24/2018   Fever 10/10/2018   Closed displaced fracture of pubis ([place]) 09/20/2018   Localized swelling of right lower leg 09/20/2018   AKI (acute kidney injury) ([place]) 08/17/2018   Myofascial pain 07/28/2018   Primary adenocarcinoma of lung ([place]) 07/21/2018   Malignant neoplasm of left female breast ([place]) 07/21/2018   Adenopathy    Goals of care, counseling/discussion 06/26/2018   IBS (irritable bowel syndrome) 05/25/2018   BPV (benign positional vertigo) 07/30/2017   Unilateral primary osteoarthritis, left knee 06/24/2017   Bunion of great toe of right foot 06/09/2017   Dizziness 03/24/2017   Decreased appetite 12/03/2016   Fatigue 09/22/2016   Insomnia 09/22/2016   Depression, recurrent ([place]) 09/22/2016   Chronic cough 08/21/2016   Primary localized osteoarthrosis, hand 03/19/2016   OA (osteoarthritis) of knee 03/17/2016   Stress due to illness of family member 10/10/2014   Ileitis 06/22/2014   Chronic insomnia 09/20/2013   DNR (do not resuscitate) 11/15/2012   Other constipation 04/01/2012   Memory loss 02/05/2012   Urinary incontinence    Major depressive disorder, recurrent, moderate    Gastroesophageal reflux disease    Hyperlipidemia     Past Surgical History:  Procedure Laterality Date   AXILLARY LYMPH NODE BIOPSY  Left 06/08/2018   INVASIVE MAMMARY CARCINOMA   BLADDER SUSPENSION  2004, 2012   BREAST BIOPSY Left 06/08/2018   INVASIVE MAMMARY CARCINOMA   CATARACT EXTRACTION W/PHACO Right 08/27/2015   Procedure: CATARACT EXTRACTION PHACO AND INTRAOCULAR LENS PLACEMENT (IOC);  Surgeon: Estill Cotta, MD;  Location: [facility] ORS;  Service: Ophthalmology;  Laterality: Right;  [place]   1:00.2 AP%  22.5 CDE  23.67 fluid casette lot #[id number]  exp05/31/2018   CATARACT EXTRACTION W/PHACO Left 10/15/2015    Procedure: CATARACT EXTRACTION PHACO AND INTRAOCULAR LENS PLACEMENT (IOC);  Surgeon: Estill Cotta, MD;  Location: [facility] ORS;  Service: Ophthalmology;  Laterality: Left;  [place] 01:07 AP% 18.1 CDE 21.57 fluid pack lot # [id number]   CHOLECYSTECTOMY     COLONOSCOPY  2017   ELECTROMAGNETIC NAVIGATION BROCHOSCOPY N/A 07/09/2018   Procedure: ELECTROMAGNETIC NAVIGATION BRONCHOSCOPY;  Surgeon: Flora Lipps, MD;  Location: [facility] ORS;  Service: Cardiopulmonary;  Laterality: N/A;   TONSILLECTOMY  1947    Prior to Admission medications   Medication Sig Start Date End Date Taking? Authorizing Provider  acetaminophen (TYLENOL) 325 MG tablet Take 325 mg by mouth every 6 (six) hours as needed for mild pain.     [provider]  albuterol (VENTOLIN HFA) 108 (90 Base) MCG/ACT inhaler Inhale 2 puffs into the lungs every 6 (six) hours as needed for wheezing or shortness of breath. 12/28/20   Duanne Guess, PA-C  anastrozole (ARIMIDEX) 1 MG tablet Take 1 tablet (1 mg total) by mouth daily. 06/25/20   Earlie Server, MD  atorvastatin (LIPITOR) 20 MG tablet TAKE 1 TABLET BY MOUTH NIGHTLY Patient not taking: Reported on 11/27/2020 02/03/20   [name], MD  benzonatate (TESSALON) 100 MG capsule TAKE 1 CAPSULE BY MOUTH 3 TIMES DAILY ASNEEDED FOR COUGH. 12/10/20   [name], MD  buPROPion (WELLBUTRIN XL) 150 MG 24 hr tablet TAKE ONE TABLET BY MOUTH EVERY DAY 09/29/18   Lucille Passy, MD  Calcium-Magnesium-Vitamin D (CALCIUM 1200+D3 PO) Take 1 tablet by mouth daily.    [provider]  crizotinib Hulda Humphrey) 250 MG capsule TAKE 1 CAPSULE (250 MG TOTAL) BY MOUTH 2 TIMES DAILY. Patient not taking: Reported on 11/27/2020 05/17/20 05/17/21  Earlie Server, MD  desonide (DESOWEN) 0.05 % lotion Apply 1 application topically as needed (after showering).    [provider]  Dextromethorphan-guaiFENesin (MUCINEX DM) 30-600 MG TB12 Take 1 tablet by mouth 2 (two) times daily as needed (for congestion/cough).     [provider]  docusate sodium (COLACE) 100 MG capsule TAKE 1 CAPSULE BY MOUTH DAILY 10/16/20   Earlie Server, MD  doxycycline (MONODOX) 100 MG capsule Take 100 mg by mouth 2 (two) times daily. 12/29/20   [provider]  fluticasone (FLONASE) 50 MCG/ACT nasal spray Place 2 sprays into both nostrils daily. 04/03/20   [name], MD  ketoconazole (NIZORAL) 2 % shampoo Apply 1 application topically as needed. 3 times a week 04/20/18   [provider]  levothyroxine (SYNTHROID) 25 MCG tablet TAKE 1 TABLET BY MOUTH DAILY 02/29/20   [name], MD  lubiprostone (AMITIZA) 24 MCG capsule TAKE ONE CAPSULE TWICE A DAY WITH MEALS 02/09/18   Pyrtle, Lajuan Lines, MD  memantine (NAMENDA) 10 MG tablet Take 1 tablet (10 mg total) by mouth 2 (two) times daily. 10/19/20   [name], MD  mirabegron ER (MYRBETRIQ) 50 MG TB24 tablet Take 1 tablet (50 mg total) by mouth daily. For overactive bladder. 11/02/20  Pleas Koch, NP  Multiple Vitamin (MULTIVITAMIN WITH MINERALS) TABS tablet Take 1 tablet by mouth daily. Patient not taking: Reported on 11/27/2020    [provider]  omeprazole (PRILOSEC) 40 MG capsule Take 40 mg by mouth daily.    [provider]  ondansetron (ZOFRAN) 8 MG tablet TAKE ONE TABLET EVERY EIGHT HOURS AS NEEDED FOR NAUSEA / VOMITING Patient not taking: Reported on 11/27/2020 10/19/20   Earlie Server, MD  polyethylene glycol [facility] / Floria Raveling) packet Take 17 g by mouth daily as needed.    [provider]  predniSONE (DELTASONE) 10 MG tablet Take 1 tablet (10 mg total) by mouth daily. 6,5,4,3,2,1 six day taper 12/28/20   Duanne Guess, PA-C  venlafaxine XR (EFFEXOR-XR) 150 MG 24 hr capsule TAKE 1 CAPSULE BY MOUTH EVERY DAY 12/01/19   Harold Hedge, MD  Vitamin D, Ergocalciferol, (DRISDOL) 1.25 MG (50000 UNIT) CAPS capsule Take 50,000 Units by mouth once a week. 12/20/19   [provider]    Allergies Sulfa antibiotics  Family History   Problem Relation Age of Onset   Diabetes Father    Heart disease Father    Lymphoma Father    Heart disease Mother    Breast cancer Sister 68   Colon cancer Neg Hx    Esophageal cancer Neg Hx    Rectal cancer Neg Hx    Stomach cancer Neg Hx    Bladder Cancer Neg Hx    Kidney cancer Neg Hx     Social History Social History   Tobacco Use   Smoking status: Never   Smokeless tobacco: Never  Vaping Use   Vaping Use: Never used  Substance Use Topics   Alcohol use: Not Currently    Comment: rare wine   Drug use: No    Review of Systems  Review of Systems  Constitutional:  Positive for fatigue. Negative for fever.  HENT:  Positive for congestion. Negative for sore throat.   Eyes:  Negative for visual disturbance.  Respiratory:  Positive for cough and shortness of breath.   Cardiovascular:  Negative for chest pain.  Gastrointestinal:  Negative for abdominal pain, diarrhea, nausea and vomiting.  Genitourinary:  Negative for flank pain.  Musculoskeletal:  Negative for back pain and neck pain.  Skin:  Negative for rash and wound.  Neurological:  Positive for weakness.  All other systems reviewed and are negative.   ____________________________________________  PHYSICAL EXAM:      VITAL SIGNS: ED Triage Vitals  Enc Vitals Group     BP 01/08/21 0836 112/60     Pulse Rate 01/08/21 0836 (!) 57     Resp 01/08/21 0836 20     Temp 01/08/21 0840 99.1 F (37.3 C)     Temp Source 01/08/21 0840 Oral     SpO2 01/08/21 0836 95 %     Weight 01/08/21 0826 138 lb 14.2 oz (63 kg)     Height 01/08/21 0826 5\' 5"  (1.651 m)     Head Circumference --      Peak Flow --      Pain Score 01/08/21 0826 8     Pain Loc --      Pain Edu? --      Excl. in [place]? --      Physical Exam Vitals and nursing note reviewed.  Constitutional:      General: She is not in acute distress.    Appearance: She is well-developed.  HENT:  Head: Normocephalic and atraumatic.  Eyes:      Conjunctiva/sclera: Conjunctivae normal.  Cardiovascular:     Rate and Rhythm: Normal rate and regular rhythm.     Heart sounds: Normal heart sounds. No murmur heard.   No friction rub.  Pulmonary:     Effort: Pulmonary effort is normal. Tachypnea present. No respiratory distress.     Breath sounds: Examination of the right-lower field reveals rhonchi. Examination of the left-lower field reveals rhonchi. Decreased breath sounds, wheezing and rhonchi present. No rales.  Abdominal:     General: There is no distension.     Palpations: Abdomen is soft.     Tenderness: There is no abdominal tenderness.  Musculoskeletal:     Cervical back: Neck supple.  Skin:    General: Skin is warm.     Capillary Refill: Capillary refill takes less than 2 seconds.  Neurological:     Mental Status: She is alert and oriented to person, place, and time.     Motor: No abnormal muscle tone.      ____________________________________________   LABS (all labs ordered are listed, but only abnormal results are displayed)  Labs Reviewed  COMPREHENSIVE METABOLIC PANEL - Abnormal; Notable for the following components:      Result Value   Sodium 133 (*)    Glucose, Bld 139 (*)    BUN 29 (*)    Creatinine, Ser 1.23 (*)    Calcium 7.6 (*)    Total Protein 5.3 (*)    Albumin 2.8 (*)    GFR, Estimated 44 (*)    All other components within normal limits  CBC - Abnormal; Notable for the following components:   WBC 13.9 (*)    RBC 3.61 (*)    Hemoglobin 11.5 (*)    HCT 34.5 (*)    All other components within normal limits  TROPONIN I (HIGH SENSITIVITY) - Abnormal; Notable for the following components:   Troponin I (High Sensitivity) 18 (*)    All other components within normal limits  RESP PANEL BY RT-PCR (FLU A&B, COVID) ARPGX2  CULTURE, BLOOD (ROUTINE X 2)  CULTURE, BLOOD (ROUTINE X 2)  LIPASE, BLOOD  LACTIC ACID, PLASMA  BRAIN NATRIURETIC PEPTIDE  URINALYSIS, COMPLETE (UACMP) WITH MICROSCOPIC   LACTIC ACID, PLASMA  PROCALCITONIN    ____________________________________________  EKG: Sinus bradycardia, VR 59. PR 184, QRS 84, QTc 439. No acute ST elevations or depressions. No ischemia or infarct. ________________________________________  RADIOLOGY All imaging, including plain films, CT scans, and ultrasounds, independently reviewed by me, and interpretations confirmed via formal radiology reads.  ED MD interpretation:   CXR: no focal PNA CT Angio: Bilateral effusions, atelectasis, no PE  Official radiology report(s): CT Angio Chest PE W and/or Wo Contrast  Result Date: 01/08/2021 CLINICAL DATA:  Cough, congestion, shortness of breath. History of lung cancer and breast cancer. EXAM: CT ANGIOGRAPHY CHEST WITH CONTRAST TECHNIQUE: Multidetector CT imaging of the chest was performed using the standard protocol during bolus administration of intravenous contrast. Multiplanar CT image reconstructions and MIPs were obtained to evaluate the vascular anatomy. CONTRAST:  34mL OMNIPAQUE IOHEXOL 350 MG/ML SOLN COMPARISON:  CT chest dated September 21, 2020. FINDINGS: Cardiovascular: Satisfactory opacification of the pulmonary arteries to the segmental level. No evidence of pulmonary embolism. Normal heart size. No pericardial effusion. No thoracic aortic aneurysm or dissection. Mild atherosclerotic calcification of the aortic arch. Mediastinum/Nodes: No enlarged mediastinal, hilar, or axillary lymph nodes. Thyroid gland, and trachea demonstrate no significant findings. New moderate  hiatal hernia and diffusely distended esophagus with air-fluid level. Lungs/Pleura: New small left greater than right pleural effusions. No consolidation or pneumothorax. Mild subsegmental atelectasis in the left lower lobe. Unchanged faintly calcified 2.3 x 2.0 cm nodule in the apical left upper lobe (series 7, image 20). Unchanged 5 mm nodule in the right middle lobe (series 7, image 39). Upper Abdomen: No acute abnormality.  Large amount of stool in the visualized colon. Musculoskeletal: No chest wall abnormality. No acute or significant osseous findings. Chronic T9 and T11 compression deformities again noted. Multiple old right-sided rib fractures again noted. Review of the MIP images confirms the above findings. IMPRESSION: 1. No evidence of pulmonary embolism. 2. New small bilateral pleural effusions. 3. New moderate hiatal hernia and diffusely distended esophagus with air-fluid level, suggestive of gastroesophageal reflux. 4. Unchanged 2.3 cm faintly calcified solid pulmonary nodule in the apical left upper lobe. 5. Aortic Atherosclerosis (ICD10-I70.0). Electronically Signed   By: Titus Dubin M.D.   On: 01/08/2021 12:50   DG Chest Portable 1 View  Result Date: 01/08/2021 CLINICAL DATA:  Shortness of breath and cough EXAM: PORTABLE CHEST 1 VIEW COMPARISON:  Eleven days ago FINDINGS: Low volume chest. Lucency under the right diaphragm has the appearance of colon. There is no edema, consolidation, effusion, or pneumothorax. Retrocardiac density is from tortuous aorta. Chronic nodular density at the left apex. IMPRESSION: Stable low volume chest.  No focal pneumonia. Electronically Signed   By: Monte Fantasia M.D.   On: 01/08/2021 11:25    ____________________________________________  PROCEDURES   Procedure(s) performed (including Critical Care):  Procedures  ____________________________________________  INITIAL IMPRESSION / MDM / [place] / ED COURSE  As part of my medical decision making, I reviewed the following data within the [place] notes reviewed and incorporated, Old chart reviewed, Notes from prior ED visits, and [place] Controlled Substance Database       *Jamie Matthews was evaluated in Emergency Department on 01/08/2021 for the symptoms described in the history of present illness. She was evaluated in the context of the global COVID-19 pandemic,  which necessitated consideration that the patient might be at risk for infection with the SARS-CoV-2 virus that causes COVID-19. Institutional protocols and algorithms that pertain to the evaluation of patients at risk for COVID-19 are in a state of rapid change based on information released by regulatory bodies including the CDC and federal and state organizations. These policies and algorithms were followed during the patient's care in the ED.  Some ED evaluations and interventions may be delayed as a result of limited staffing during the pandemic.*     Medical Decision Making:  80 yo F with PMHx above here with cough, SOB, fatigue. Pt is newly hypoxic to high 80s on RA. Exam is as above. Clinically, suspect CAP with hypoxia, DDx includes CHF. Will start fluids, cultures, check CXR and infectious labs. Afebrile, HR 60s.   Labs overall fairly unremarkable. Slight trop elevation likely demand related, EKG is nonischemic. COVID eng. LA normal. CXR reviewed and is clear. CT angio obtained 2/2 h/o lung CA, hypoxia w/ normal CXR. Angio reviewed, shows no PE. She does have b/l effusions, atelectasis - query early PNA. Will admit for management.   Procal pending. Duoneb given. Will admit. ABX held pending procal, further work-up. Abdomen is soft, NT, ND, with no focal TTP. ____________________________________________  FINAL CLINICAL IMPRESSION(S) / ED DIAGNOSES  Final diagnoses:  Acute respiratory failure with hypoxia (HCC)  Pleural  effusion     MEDICATIONS GIVEN DURING THIS VISIT:  Medications  levothyroxine (SYNTHROID) tablet 25 mcg (has no administration in time range)  pantoprazole (PROTONIX) EC tablet 40 mg (has no administration in time range)  polyethylene glycol (MIRALAX / GLYCOLAX) packet 17 g (has no administration in time range)  fluticasone (FLONASE) 50 MCG/ACT nasal spray 2 spray (has no administration in time range)  dextromethorphan-guaiFENesin (MUCINEX DM) 30-600 MG per 12 hr  tablet 1 tablet (has no administration in time range)  acetaminophen (TYLENOL) tablet 650 mg (has no administration in time range)    Or  acetaminophen (TYLENOL) suppository 650 mg (has no administration in time range)  ondansetron (ZOFRAN) tablet 4 mg (has no administration in time range)    Or  ondansetron (ZOFRAN) injection 4 mg (has no administration in time range)  enoxaparin (LOVENOX) injection 40 mg (has no administration in time range)  oxymetazoline (AFRIN) 0.05 % nasal spray 1 spray (1 spray Each Nare Given 01/08/21 1113)  ipratropium-albuterol (DUONEB) 0.5-2.5 (3) MG/3ML nebulizer solution 3 mL (3 mLs Nebulization Given 01/08/21 1113)  sodium chloride 0.9 % bolus 1,000 mL (0 mLs Intravenous Stopped 01/08/21 1236)  ondansetron (ZOFRAN) injection 4 mg (4 mg Intravenous Given 01/08/21 1132)  famotidine (PEPCID) IVPB 20 mg premix (0 mg Intravenous Stopped 01/08/21 1236)  iohexol (OMNIPAQUE) 350 MG/ML injection 60 mL (60 mLs Intravenous Contrast Given 01/08/21 1200)     ED Discharge Orders     None        Note:  This document was prepared using Dragon voice recognition software and may include unintentional dictation errors.   Duffy Bruce, MD 01/08/21 1326

## 2021-01-08 NOTE — ED Triage Notes (Signed)
Pt reports has a lot of congestion. EMS report was pt with abd pain. When pt asked about abd pain, pt states "maybe a little". Pt reports has a lot of congestion. Pt reports the discomfort was in her lower abd

## 2021-01-08 NOTE — ED Notes (Signed)
Pt removed purewick and urinated on self and bed, new brief applied as well as new purewick.

## 2021-01-09 DIAGNOSIS — C50912 Malignant neoplasm of unspecified site of left female breast: Secondary | ICD-10-CM | POA: Diagnosis present

## 2021-01-09 DIAGNOSIS — Z79890 Hormone replacement therapy (postmenopausal): Secondary | ICD-10-CM | POA: Diagnosis not present

## 2021-01-09 DIAGNOSIS — Z20822 Contact with and (suspected) exposure to covid-19: Secondary | ICD-10-CM | POA: Diagnosis present

## 2021-01-09 DIAGNOSIS — Z66 Do not resuscitate: Secondary | ICD-10-CM | POA: Diagnosis present

## 2021-01-09 DIAGNOSIS — R296 Repeated falls: Secondary | ICD-10-CM | POA: Diagnosis not present

## 2021-01-09 DIAGNOSIS — F0390 Unspecified dementia without behavioral disturbance: Secondary | ICD-10-CM | POA: Diagnosis not present

## 2021-01-09 DIAGNOSIS — J90 Pleural effusion, not elsewhere classified: Secondary | ICD-10-CM | POA: Diagnosis not present

## 2021-01-09 DIAGNOSIS — F419 Anxiety disorder, unspecified: Secondary | ICD-10-CM | POA: Diagnosis not present

## 2021-01-09 DIAGNOSIS — F32A Depression, unspecified: Secondary | ICD-10-CM | POA: Diagnosis present

## 2021-01-09 DIAGNOSIS — Z9842 Cataract extraction status, left eye: Secondary | ICD-10-CM | POA: Diagnosis not present

## 2021-01-09 DIAGNOSIS — M8580 Other specified disorders of bone density and structure, unspecified site: Secondary | ICD-10-CM | POA: Diagnosis present

## 2021-01-09 DIAGNOSIS — Z743 Need for continuous supervision: Secondary | ICD-10-CM | POA: Diagnosis not present

## 2021-01-09 DIAGNOSIS — N3281 Overactive bladder: Secondary | ICD-10-CM | POA: Diagnosis not present

## 2021-01-09 DIAGNOSIS — R2689 Other abnormalities of gait and mobility: Secondary | ICD-10-CM | POA: Diagnosis not present

## 2021-01-09 DIAGNOSIS — E039 Hypothyroidism, unspecified: Secondary | ICD-10-CM | POA: Diagnosis present

## 2021-01-09 DIAGNOSIS — R278 Other lack of coordination: Secondary | ICD-10-CM | POA: Diagnosis not present

## 2021-01-09 DIAGNOSIS — Z9049 Acquired absence of other specified parts of digestive tract: Secondary | ICD-10-CM | POA: Diagnosis not present

## 2021-01-09 DIAGNOSIS — J069 Acute upper respiratory infection, unspecified: Secondary | ICD-10-CM | POA: Diagnosis present

## 2021-01-09 DIAGNOSIS — Z9841 Cataract extraction status, right eye: Secondary | ICD-10-CM | POA: Diagnosis not present

## 2021-01-09 DIAGNOSIS — K219 Gastro-esophageal reflux disease without esophagitis: Secondary | ICD-10-CM | POA: Diagnosis not present

## 2021-01-09 DIAGNOSIS — R531 Weakness: Secondary | ICD-10-CM | POA: Diagnosis not present

## 2021-01-09 DIAGNOSIS — E785 Hyperlipidemia, unspecified: Secondary | ICD-10-CM | POA: Diagnosis present

## 2021-01-09 DIAGNOSIS — M797 Fibromyalgia: Secondary | ICD-10-CM | POA: Diagnosis present

## 2021-01-09 DIAGNOSIS — C3490 Malignant neoplasm of unspecified part of unspecified bronchus or lung: Secondary | ICD-10-CM | POA: Diagnosis present

## 2021-01-09 DIAGNOSIS — I350 Nonrheumatic aortic (valve) stenosis: Secondary | ICD-10-CM | POA: Diagnosis present

## 2021-01-09 DIAGNOSIS — R0602 Shortness of breath: Secondary | ICD-10-CM

## 2021-01-09 DIAGNOSIS — Z961 Presence of intraocular lens: Secondary | ICD-10-CM | POA: Diagnosis present

## 2021-01-09 DIAGNOSIS — R5381 Other malaise: Secondary | ICD-10-CM | POA: Diagnosis not present

## 2021-01-09 DIAGNOSIS — I700 Atherosclerosis of aorta: Secondary | ICD-10-CM | POA: Diagnosis present

## 2021-01-09 DIAGNOSIS — M1712 Unilateral primary osteoarthritis, left knee: Secondary | ICD-10-CM | POA: Diagnosis present

## 2021-01-09 DIAGNOSIS — K589 Irritable bowel syndrome without diarrhea: Secondary | ICD-10-CM | POA: Diagnosis present

## 2021-01-09 DIAGNOSIS — J9601 Acute respiratory failure with hypoxia: Secondary | ICD-10-CM | POA: Diagnosis not present

## 2021-01-09 DIAGNOSIS — F329 Major depressive disorder, single episode, unspecified: Secondary | ICD-10-CM | POA: Diagnosis not present

## 2021-01-09 DIAGNOSIS — M6281 Muscle weakness (generalized): Secondary | ICD-10-CM | POA: Diagnosis not present

## 2021-01-09 DIAGNOSIS — Z79899 Other long term (current) drug therapy: Secondary | ICD-10-CM | POA: Diagnosis not present

## 2021-01-09 LAB — CBC
HCT: 32.1 % — ABNORMAL LOW (ref 36.0–46.0)
Hemoglobin: 10.8 g/dL — ABNORMAL LOW (ref 12.0–15.0)
MCH: 31.8 pg (ref 26.0–34.0)
MCHC: 33.6 g/dL (ref 30.0–36.0)
MCV: 94.4 fL (ref 80.0–100.0)
Platelets: 360 10*3/uL (ref 150–400)
RBC: 3.4 MIL/uL — ABNORMAL LOW (ref 3.87–5.11)
RDW: 13.7 % (ref 11.5–15.5)
WBC: 11.4 10*3/uL — ABNORMAL HIGH (ref 4.0–10.5)
nRBC: 0 % (ref 0.0–0.2)

## 2021-01-09 LAB — BASIC METABOLIC PANEL
Anion gap: 7 (ref 5–15)
BUN: 26 mg/dL — ABNORMAL HIGH (ref 8–23)
CO2: 27 mmol/L (ref 22–32)
Calcium: 7.4 mg/dL — ABNORMAL LOW (ref 8.9–10.3)
Chloride: 101 mmol/L (ref 98–111)
Creatinine, Ser: 1.14 mg/dL — ABNORMAL HIGH (ref 0.44–1.00)
GFR, Estimated: 48 mL/min — ABNORMAL LOW (ref >60)
Glucose, Bld: 164 mg/dL — ABNORMAL HIGH (ref 70–99)
Potassium: 4.5 mmol/L (ref 3.5–5.1)
Sodium: 135 mmol/L (ref 135–145)

## 2021-01-09 LAB — PROCALCITONIN: Procalcitonin: <0.1 ng/mL

## 2021-01-09 MED ORDER — ALUM & MAG HYDROXIDE-SIMETH 200-200-20 MG/5ML PO SUSP
30.0000 mL | Freq: Four times a day (QID) | ORAL | Status: DC | PRN
  Administered 2021-01-10 – 2021-01-11 (×3): 30 mL via ORAL
  Filled 2021-01-09 (×4): qty 30

## 2021-01-09 NOTE — TOC Initial Note (Signed)
Transition of Care [facility]) - Initial/Assessment Note    Patient Details  Name: Jamie Matthews MRN: [id number] Date of Birth: 04/03/1941  Transition of Care [facility]) CM/SW Contact:    Candie Chroman, LCSW Phone Number: 01/09/2021, 4:24 PM  Clinical Narrative: CSW met with patient. No supports at bedside. CSW introduced role and explained that therapy recommendations would be discussed. Patient is agreeable to getting rehab at [facility]. Per admissions coordinator, she had been on their memory care side for respite while husband getting rehab prior to admission. PASARR under manual review. Asked MD to sign 30-day note and FL2. No further concerns. CSW encouraged patient to contact CSW as needed. CSW will continue to follow patient for support and facilitate discharge to SNF once medically stable.                  Expected Discharge Plan: [place] Barriers to Discharge: Continued Medical Work up   Patient Goals and CMS Choice     Choice offered to / list presented to : Patient  Expected Discharge Plan and Services Expected Discharge Plan: [place] Choice: [place] Living arrangements for the past 2 months: [place]                                      Prior Living Arrangements/Services Living arrangements for the past 2 months: [place] Lives with:: Spouse Patient language and need for interpreter reviewed:: Yes Do you feel safe going back to the place where you live?: Yes      Need for Family Participation in Patient Care: Yes (Comment) Care giver support system in place?: Yes (comment)   Criminal Activity/Legal Involvement Pertinent to Current Situation/Hospitalization: No - Comment as needed  Activities of Daily Living Home Assistive Devices/Equipment: Environmental consultant (specify type), Shower chair with back ADL Screening (condition at time of  admission) Patient's cognitive ability adequate to safely complete daily activities?: Yes Is the patient deaf or have difficulty hearing?: No Does the patient have difficulty seeing, even when wearing glasses/contacts?: No Does the patient have difficulty concentrating, remembering, or making decisions?: No Patient able to express need for assistance with ADLs?: Yes Does the patient have difficulty dressing or bathing?: Yes Independently performs ADLs?: No Communication: Independent Does the patient have difficulty walking or climbing stairs?: Yes Weakness of Legs: Both Weakness of Arms/Hands: None  Permission Sought/Granted Permission sought to share information with : Facility Art therapist granted to share information with : Yes, Verbal Permission Granted     Permission granted to share info w AGENCY: [facility]        Emotional Assessment Appearance:: Appears stated age Attitude/Demeanor/Rapport: Engaged, Gracious Affect (typically observed): Accepting, Appropriate, Calm, Pleasant Orientation: : Oriented to Self, Fluctuating Orientation (Suspected and/or reported Sundowners) Alcohol / Substance Use: Not Applicable Psych Involvement: No (comment)  Admission diagnosis:  Shortness of breath [R06.02] Pleural effusion [J90] Acute respiratory failure with hypoxia (HCC) [J96.01] Patient Active Problem List   Diagnosis Date Noted   Shortness of breath 01/08/2021   Aortic valve stenosis 01/19/2020   Left hip pain 12/05/2019   Hypocalcemia 11/02/2019   Trochanteric bursitis 09/20/2019   Pruritus 09/06/2019   Overactive bladder 09/06/2019   Chronic pain of right knee 09/06/2019   Multiple rib fractures 07/15/2019   Fall as cause of accidental injury  at home as place of occurrence 07/15/2019   Encounter for antineoplastic chemotherapy 12/17/2018   Lower leg edema 12/17/2018   Aromatase inhibitor use 10/24/2018   Osteopenia 10/24/2018   Fever 10/10/2018    Closed displaced fracture of pubis ([place]) 09/20/2018   Localized swelling of right lower leg 09/20/2018   AKI (acute kidney injury) ([place]) 08/17/2018   Myofascial pain 07/28/2018   Primary adenocarcinoma of lung ([place]) 07/21/2018   Malignant neoplasm of left female breast ([place]) 07/21/2018   Adenopathy    Goals of care, counseling/discussion 06/26/2018   IBS (irritable bowel syndrome) 05/25/2018   BPV (benign positional vertigo) 07/30/2017   Unilateral primary osteoarthritis, left knee 06/24/2017   Bunion of great toe of right foot 06/09/2017   Dizziness 03/24/2017   Decreased appetite 12/03/2016   Fatigue 09/22/2016   Insomnia 09/22/2016   Depression, recurrent ([place]) 09/22/2016   Chronic cough 08/21/2016   Primary localized osteoarthrosis, hand 03/19/2016   OA (osteoarthritis) of knee 03/17/2016   Stress due to illness of family member 10/10/2014   Ileitis 06/22/2014   Chronic insomnia 09/20/2013   DNR (do not resuscitate) 11/15/2012   Other constipation 04/01/2012   Memory loss 02/05/2012   Urinary incontinence    Major depressive disorder, recurrent, moderate    Gastroesophageal reflux disease    Hyperlipidemia    PCP:  [name], MD Pharmacy:   [place], [place] [address] [place] [address] Phone: [phone] Fax: [phone]  [place] [address] Phone: [phone] Fax: [phone]     Social Determinants of Health (SDOH) Interventions    Readmission Risk Interventions Readmission Risk Prevention Plan 10/10/2018  Post Dischage Appt Complete  Medication Screening Complete  Transportation Screening Complete  Some recent data might be hidden

## 2021-01-09 NOTE — Progress Notes (Signed)
PROGRESS NOTE    Jamie Matthews  BJY:[id number] DOB: [birth year] DOA: 01/08/2021 PCP: [name], MD    Brief Narrative:  Jamie Matthews is a 80 y.o. female with medical history significant for left breast cancer stage Ib, primary adenocarcinoma of the lung stage IVa, hypothyroid, acid reflux, hyperlipidemia, depression, memory decline, aortic atherosclerosis, presents emergency department for chief concerns of weakness and hypoxia via EMS. She endorses epigastric and mid abdominal pain with cough. She also endorses consitpation and that she has not had a bowel movement in 4 days. She denies nausea, vomiting, shortness of breath, chest pain.  COVID/influenza A/influenza B PCR were negative ED provider ordered a CTA of the chest to assess for pulmonary embolism-this was read as no evidence of PE, small new bilateral pleural effusions, new moderate hiatal hernia and diffusely distended esophagus with air-fluid level, suggestive of gastroesophageal reflux; unchanged 2.3 cm faintly calcified pulmonary nodule in the apical left lung.  Aortic atherosclerosis  Consultants:    Procedures:   Antimicrobials:      Subjective: Still with sob, not at baseline. No cp.   Objective: Vitals:   01/09/21 0117 01/09/21 0458 01/09/21 0724 01/09/21 0725  BP: (!) 126/51 125/83  (!) 135/55  Pulse: (!) 48 (!) 52  (!) 55  Resp: 18 18  20   Temp: 98.1 F (36.7 C) (!) 97.4 F (36.3 C)  (!) 97.1 F (36.2 C)  TempSrc:      SpO2: 97% 92% 96% 96%  Weight:      Height:       No intake or output data in the 24 hours ending 01/09/21 0821 Filed Weights   01/08/21 0826  Weight: 63 kg    Examination:  General exam: Appears calm and comfortable  Respiratory system: Clear to auscultation. Respiratory effort normal. Cardiovascular system: S1 & S2 heard, RRR. No JVD, murmurs, rubs, gallops or clicks. No pedal edema. Gastrointestinal system: Abdomen is nondistended, soft and  nontender. No organomegaly or masses felt. Normal bowel sounds heard. Central nervous system: Alert and oriented. No focal neurological deficits. Extremities: Symmetric 5 x 5 power. Skin: No rashes, lesions or ulcers Psychiatry: Judgement and insight appear normal. Mood & affect appropriate.     Data Reviewed: I have personally reviewed following labs and imaging studies  CBC: Recent Labs  Lab 01/08/21 0837 01/09/21 0507  WBC 13.9* 11.4*  HGB 11.5* 10.8*  HCT 34.5* 32.1*  MCV 95.6 94.4  PLT 336 086   Basic Metabolic Panel: Recent Labs  Lab 01/08/21 0837 01/09/21 0507  NA 133* 135  K 4.1 4.5  CL 98 101  CO2 26 27  GLUCOSE 139* 164*  BUN 29* 26*  CREATININE 1.23* 1.14*  CALCIUM 7.6* 7.4*   GFR: Estimated Creatinine Clearance: 34.2 mL/min (A) (by C-G formula based on SCr of 1.14 mg/dL (H)). Liver Function Tests: Recent Labs  Lab 01/08/21 0837  AST 40  ALT 38  ALKPHOS 70  BILITOT 0.7  PROT 5.3*  ALBUMIN 2.8*   Recent Labs  Lab 01/08/21 0837  LIPASE 38   No results for input(s): AMMONIA in the last 168 hours. Coagulation Profile: No results for input(s): INR, PROTIME in the last 168 hours. Cardiac Enzymes: No results for input(s): CKTOTAL, CKMB, CKMBINDEX, TROPONINI in the last 168 hours. BNP (last 3 results) No results for input(s): PROBNP in the last 8760 hours. HbA1C: No results for input(s): HGBA1C in the last 72 hours. CBG: No results for input(s): GLUCAP in  the last 168 hours. Lipid Profile: No results for input(s): CHOL, HDL, LDLCALC, TRIG, CHOLHDL, LDLDIRECT in the last 72 hours. Thyroid Function Tests: No results for input(s): TSH, T4TOTAL, FREET4, T3FREE, THYROIDAB in the last 72 hours. Anemia Panel: No results for input(s): VITAMINB12, FOLATE, FERRITIN, TIBC, IRON, RETICCTPCT in the last 72 hours. Sepsis Labs: Recent Labs  Lab 01/08/21 0837 01/08/21 1111 01/08/21 1829 01/09/21 0507  PROCALCITON <0.10  --   --  <0.10  LATICACIDVEN  --   1.3 1.4  --     Recent Results (from the past 240 hour(s))  Blood culture (routine x 2)     Status: None (Preliminary result)   Collection Time: 01/08/21 11:11 AM   Specimen: BLOOD  Result Value Ref Range Status   Specimen Description BLOOD LEFT ANTECUBITAL  Final   Special Requests   Final    BOTTLES DRAWN AEROBIC ONLY Blood Culture adequate volume   Culture   Final    NO GROWTH < 24 HOURS Performed at [facility], [address] [place], [place] [address]    Report Status PENDING  Incomplete  Blood culture (routine x 2)     Status: None (Preliminary result)   Collection Time: 01/08/21 11:11 AM   Specimen: BLOOD  Result Value Ref Range Status   Specimen Description BLOOD RIGHT ANTECUBITAL  Final   Special Requests   Final    BOTTLES DRAWN AEROBIC AND ANAEROBIC Blood Culture results may not be optimal due to an inadequate volume of blood received in culture bottles   Culture   Final    NO GROWTH < 24 HOURS Performed at [facility], [address], [place], [place] [address]    Report Status PENDING  Incomplete  Resp Panel by RT-PCR (Flu A&B, Covid) Nasopharyngeal Swab     Status: None   Collection Time: 01/08/21 11:11 AM   Specimen: Nasopharyngeal Swab; Nasopharyngeal(NP) swabs in vial transport medium  Result Value Ref Range Status   SARS Coronavirus 2 by RT PCR NEGATIVE NEGATIVE Final    Comment: (NOTE) SARS-CoV-2 target nucleic acids are NOT DETECTED.  The SARS-CoV-2 RNA is generally detectable in upper respiratory specimens during the acute phase of infection. The lowest concentration of SARS-CoV-2 viral copies this assay can detect is 138 copies/mL. A negative result does not preclude SARS-Cov-2 infection and should not be used as the sole basis for treatment or other patient management decisions. A negative result may occur with  improper specimen collection/handling, submission of specimen other than nasopharyngeal swab, presence of viral  mutation(s) within the areas targeted by this assay, and inadequate number of viral copies(<138 copies/mL). A negative result must be combined with clinical observations, patient history, and epidemiological information. The expected result is Negative.  Fact Sheet for Patients:  EntrepreneurPulse.com.au  Fact Sheet for Healthcare Providers:  IncredibleEmployment.be  This test is no t yet approved or cleared by the [place] FDA and  has been authorized for detection and/or diagnosis of SARS-CoV-2 by FDA under an Emergency Use Authorization (EUA). This EUA will remain  in effect (meaning this test can be used) for the duration of the COVID-19 declaration under Section 564(b)(1) of the Act, 21 U.S.C.section 360bbb-3(b)(1), unless the authorization is terminated  or revoked sooner.       Influenza A by PCR NEGATIVE NEGATIVE Final   Influenza B by PCR NEGATIVE NEGATIVE Final    Comment: (NOTE) The Xpert Xpress SARS-CoV-2/FLU/RSV plus assay is intended as an aid in the diagnosis  of influenza from Nasopharyngeal swab specimens and should not be used as a sole basis for treatment. Nasal washings and aspirates are unacceptable for Xpert Xpress SARS-CoV-2/FLU/RSV testing.  Fact Sheet for Patients: EntrepreneurPulse.com.au  Fact Sheet for Healthcare Providers: IncredibleEmployment.be  This test is not yet approved or cleared by the [place] FDA and has been authorized for detection and/or diagnosis of SARS-CoV-2 by FDA under an Emergency Use Authorization (EUA). This EUA will remain in effect (meaning this test can be used) for the duration of the COVID-19 declaration under Section 564(b)(1) of the Act, 21 U.S.C. section 360bbb-3(b)(1), unless the authorization is terminated or revoked.  Performed at [facility], [address] [place], [place] [address]          Radiology  Studies: CT Angio Chest PE W and/or Wo Contrast  Result Date: 01/08/2021 CLINICAL DATA:  Cough, congestion, shortness of breath. History of lung cancer and breast cancer. EXAM: CT ANGIOGRAPHY CHEST WITH CONTRAST TECHNIQUE: Multidetector CT imaging of the chest was performed using the standard protocol during bolus administration of intravenous contrast. Multiplanar CT image reconstructions and MIPs were obtained to evaluate the vascular anatomy. CONTRAST:  69mL OMNIPAQUE IOHEXOL 350 MG/ML SOLN COMPARISON:  CT chest dated September 21, 2020. FINDINGS: Cardiovascular: Satisfactory opacification of the pulmonary arteries to the segmental level. No evidence of pulmonary embolism. Normal heart size. No pericardial effusion. No thoracic aortic aneurysm or dissection. Mild atherosclerotic calcification of the aortic arch. Mediastinum/Nodes: No enlarged mediastinal, hilar, or axillary lymph nodes. Thyroid gland, and trachea demonstrate no significant findings. New moderate hiatal hernia and diffusely distended esophagus with air-fluid level. Lungs/Pleura: New small left greater than right pleural effusions. No consolidation or pneumothorax. Mild subsegmental atelectasis in the left lower lobe. Unchanged faintly calcified 2.3 x 2.0 cm nodule in the apical left upper lobe (series 7, image 20). Unchanged 5 mm nodule in the right middle lobe (series 7, image 39). Upper Abdomen: No acute abnormality. Large amount of stool in the visualized colon. Musculoskeletal: No chest wall abnormality. No acute or significant osseous findings. Chronic T9 and T11 compression deformities again noted. Multiple old right-sided rib fractures again noted. Review of the MIP images confirms the above findings. IMPRESSION: 1. No evidence of pulmonary embolism. 2. New small bilateral pleural effusions. 3. New moderate hiatal hernia and diffusely distended esophagus with air-fluid level, suggestive of gastroesophageal reflux. 4. Unchanged 2.3 cm faintly  calcified solid pulmonary nodule in the apical left upper lobe. 5. Aortic Atherosclerosis (ICD10-I70.0). Electronically Signed   By: Titus Dubin M.D.   On: 01/08/2021 12:50   DG Chest Portable 1 View  Result Date: 01/08/2021 CLINICAL DATA:  Shortness of breath and cough EXAM: PORTABLE CHEST 1 VIEW COMPARISON:  Eleven days ago FINDINGS: Low volume chest. Lucency under the right diaphragm has the appearance of colon. There is no edema, consolidation, effusion, or pneumothorax. Retrocardiac density is from tortuous aorta. Chronic nodular density at the left apex. IMPRESSION: Stable low volume chest.  No focal pneumonia. Electronically Signed   By: Monte Fantasia M.D.   On: 01/08/2021 11:25        Scheduled Meds:  anastrozole  1 mg Oral Daily   buPROPion  150 mg Oral Daily   chlorpheniramine-HYDROcodone  5 mL Oral QHS   enoxaparin (LOVENOX) injection  40 mg Subcutaneous Q24H   fluticasone  2 spray Each Nare Daily   levothyroxine  25 mcg Oral Daily   lubiprostone  24 mcg Oral BID WC   memantine  10 mg Oral BID   methylPREDNISolone (SOLU-MEDROL) injection  40 mg Intravenous BID   mirabegron ER  50 mg Oral Daily   multivitamin with minerals  1 tablet Oral Daily   pantoprazole  40 mg Oral q AM   polyethylene glycol  17 g Oral BID   venlafaxine XR  150 mg Oral q morning   [START ON 01/13/2021] Vitamin D (Ergocalciferol)  50,000 Units Oral Q Sun-1800   Continuous Infusions:  Assessment & Plan:   Principal Problem:   Shortness of breath Active Problems:   Gastroesophageal reflux disease   Hyperlipidemia   DNR (do not resuscitate)   OA (osteoarthritis) of knee   Chronic cough   Fatigue   Depression, recurrent (HCC)   Decreased appetite   IBS (irritable bowel syndrome)   Primary adenocarcinoma of lung (HCC)   Malignant neoplasm of left female breast (HCC)   Aromatase inhibitor use   Aortic valve stenosis   # Hypoxia and shortness of breath- Possible due to URI Still not at  baseline, and with sob Wean down/off 02 as tolerated Continue IV steroids continue neb treatments Procalcitonin less than <0.10, will not start abx at this time     # Leukocytosis- Trending down  Blood cultures pending  UA negative Possibly reactive    # History of constipation-currently constipated as patient has not taken MiraLAX for the last 5 days Continue MiraLAX twice daily     # Does not meet criteria for sepsis-sepsis ruled out   # GERD-continue PPI  # Hypothyroid-levothyroxine 25 mcg daily   # History of left breast cancer, stage Ib-resumed home dosing of Arimidex 1 mg daily  # Primary adenocarcinoma of the lung, stage IVa, on crizotinib - Discussed with Dr. Tasia Catchings via secure chat who recommended that since patient has not been taking her crizotinib, okay to hold the medication at this time while inpatient.  Medical oncology will arrange for outpatient follow-up.   # Hyperlipidemia -patient states she does not take atorvastatin 20 mg daily, this has not been resumed   # Depression/anxiety-resumed bupropion 150 mg daily   # History of memory decline-Namenda 10 mg twice daily resumed   # Aortic atherosclerosis-outpatient follow-up   DVT prophylaxis: Lovenox Code Status: DNR Family Communication: None at bedside Disposition Plan:  Status is: Observation  The patient remains OBS appropriate and will d/c before 2 midnights.  Dispo: The patient is from: Home              Anticipated d/c is to: Home              Patient currently is not medically stable to d/c.   Difficult to place patient No   Still with shortness of breath not at baseline.  Still on IV steroids.         LOS: 0 days   Time spent: 35 minutes with more than 50% on [place], MD [facility] Pager [phone]  If 7PM-7AM, please contact night-coverage 01/09/2021, 8:21 AM

## 2021-01-09 NOTE — NC FL2 (Signed)
[place] LEVEL OF CARE SCREENING TOOL     IDENTIFICATION  Patient Name: Jamie Matthews Birthdate: 03-May-1941 Sex: female Admission Date (Current Location): 01/08/2021  [facility] and [place] Number:  Engineering geologist and Address:  [facility], [address] [place], [place] [address]      Provider Number: [id number]  Attending Physician Name and Address:  Nolberto Hanlon, MD  Relative Name and Phone Number:       Current Level of Care: Hospital Recommended Level of Care: [place] Prior Approval Number:    Date Approved/Denied:   PASRR Number: Manual review  Discharge Plan: SNF    Current Diagnoses: Patient Active Problem List   Diagnosis Date Noted   Shortness of breath 01/08/2021   Aortic valve stenosis 01/19/2020   Left hip pain 12/05/2019   Hypocalcemia 11/02/2019   Trochanteric bursitis 09/20/2019   Pruritus 09/06/2019   Overactive bladder 09/06/2019   Chronic pain of right knee 09/06/2019   Multiple rib fractures 07/15/2019   Fall as cause of accidental injury at home as place of occurrence 07/15/2019   Encounter for antineoplastic chemotherapy 12/17/2018   Lower leg edema 12/17/2018   Aromatase inhibitor use 10/24/2018   Osteopenia 10/24/2018   Fever 10/10/2018   Closed displaced fracture of pubis ([place]) 09/20/2018   Localized swelling of right lower leg 09/20/2018   AKI (acute kidney injury) ([place]) 08/17/2018   Myofascial pain 07/28/2018   Primary adenocarcinoma of lung ([place]) 07/21/2018   Malignant neoplasm of left female breast ([place]) 07/21/2018   Adenopathy    Goals of care, counseling/discussion 06/26/2018   IBS (irritable bowel syndrome) 05/25/2018   BPV (benign positional vertigo) 07/30/2017   Unilateral primary osteoarthritis, left knee 06/24/2017   Bunion of great toe of right foot 06/09/2017   Dizziness 03/24/2017   Decreased appetite 12/03/2016   Fatigue 09/22/2016    Insomnia 09/22/2016   Depression, recurrent ([place]) 09/22/2016   Chronic cough 08/21/2016   Primary localized osteoarthrosis, hand 03/19/2016   OA (osteoarthritis) of knee 03/17/2016   Stress due to illness of family member 10/10/2014   Ileitis 06/22/2014   Chronic insomnia 09/20/2013   DNR (do not resuscitate) 11/15/2012   Other constipation 04/01/2012   Memory loss 02/05/2012   Urinary incontinence    Major depressive disorder, recurrent, moderate    Gastroesophageal reflux disease    Hyperlipidemia     Orientation RESPIRATION BLADDER Height & Weight     Self, Place, Situation (Fluctuates)  Normal Incontinent Weight: 138 lb 14.2 oz (63 kg) Height:  5\' 5"  (165.1 cm)  BEHAVIORAL SYMPTOMS/MOOD NEUROLOGICAL BOWEL NUTRITION STATUS   (None)   Continent Diet (Heart healthy)  AMBULATORY STATUS COMMUNICATION OF NEEDS Skin   Extensive Assist Verbally Normal                       Personal Care Assistance Level of Assistance  Bathing, Feeding, Dressing Bathing Assistance: Maximum assistance Feeding assistance: Limited assistance Dressing Assistance: Maximum assistance     Functional Limitations Info  Sight, Hearing, Speech Sight Info: Adequate Hearing Info: Adequate Speech Info: Adequate    SPECIAL CARE FACTORS FREQUENCY  PT (By licensed PT), OT (By licensed OT)     PT Frequency: 5 x week OT Frequency: 5 x week            Contractures Contractures Info: Not present    Additional Factors Info  Code Status, Allergies, Psychotropic Code Status Info:  DNR Allergies Info: Sulfa antibiotics Psychotropic Info: Depression         Current Medications (01/09/2021):  This is the current hospital active medication list Current Facility-Administered Medications  Medication Dose Route Frequency Provider Last Rate Last Admin   acetaminophen (TYLENOL) tablet 650 mg  650 mg Oral Q6H PRN Cox, Amy N, DO       Or   acetaminophen (TYLENOL) suppository 650 mg  650 mg Rectal Q6H  PRN Cox, Amy N, DO       albuterol (PROVENTIL) (2.5 MG/3ML) 0.083% nebulizer solution 2.5 mg  2.5 mg Nebulization Q6H PRN Cox, Amy N, DO       anastrozole (ARIMIDEX) tablet 1 mg  1 mg Oral Daily Cox, Amy N, DO   1 mg at 01/09/21 0931   buPROPion (WELLBUTRIN XL) 24 hr tablet 150 mg  150 mg Oral Daily Cox, Amy N, DO   150 mg at 01/09/21 0931   chlorpheniramine-HYDROcodone (TUSSIONEX) 10-8 MG/5ML suspension 5 mL  5 mL Oral QHS Cox, Amy N, DO   5 mL at 01/08/21 2158   enoxaparin (LOVENOX) injection 40 mg  40 mg Subcutaneous Q24H Cox, Amy N, DO   40 mg at 01/08/21 2157   fluticasone (FLONASE) 50 MCG/ACT nasal spray 2 spray  2 spray Each Nare Daily Cox, Amy N, DO   2 spray at 01/09/21 0258   guaiFENesin-dextromethorphan (ROBITUSSIN DM) 100-10 MG/5ML syrup 10 mL  10 mL Oral Q6H PRN Cox, Amy N, DO       levothyroxine (SYNTHROID) tablet 25 mcg  25 mcg Oral Daily Cox, Amy N, DO   25 mcg at 01/09/21 0603   lubiprostone (AMITIZA) capsule 24 mcg  24 mcg Oral BID WC Cox, Amy N, DO   24 mcg at 01/09/21 0930   memantine (NAMENDA) tablet 10 mg  10 mg Oral BID Cox, Amy N, DO   10 mg at 01/09/21 0930   methylPREDNISolone sodium succinate (SOLU-MEDROL) 40 mg/mL injection 40 mg  40 mg Intravenous BID Cox, Amy N, DO   40 mg at 01/09/21 0932   mirabegron ER (MYRBETRIQ) tablet 50 mg  50 mg Oral Daily Cox, Amy N, DO   50 mg at 01/09/21 0930   multivitamin with minerals tablet 1 tablet  1 tablet Oral Daily Cox, Amy N, DO   1 tablet at 01/09/21 0930   ondansetron (ZOFRAN) tablet 4 mg  4 mg Oral Q6H PRN Cox, Amy N, DO       Or   ondansetron (ZOFRAN) injection 4 mg  4 mg Intravenous Q6H PRN Cox, Amy N, DO       pantoprazole (PROTONIX) EC tablet 40 mg  40 mg Oral q AM Cox, Amy N, DO   40 mg at 01/09/21 0935   polyethylene glycol (MIRALAX / GLYCOLAX) packet 17 g  17 g Oral BID Cox, Amy N, DO   17 g at 01/09/21 0929   venlafaxine XR (EFFEXOR-XR) 24 hr capsule 150 mg  150 mg Oral q morning Cox, Amy N, DO   150 mg at 01/09/21 0935    [START ON 01/13/2021] Vitamin D (Ergocalciferol) (DRISDOL) capsule 50,000 Units  50,000 Units Oral Q Sun-1800 Cox, Amy N, DO         Discharge Medications: Please see discharge summary for a list of discharge medications.  Relevant Imaging Results:  Relevant Lab Results:   Additional Information SS#: [id number]  Candie Chroman, LCSW

## 2021-01-09 NOTE — Evaluation (Signed)
Occupational Therapy Evaluation Patient Details Name: Jamie Matthews MRN: [id number] DOB: [birth year] Today's Date: 01/09/2021    History of Present Illness Pt is an 80 y.o. female with medical history significant for left breast cancer stage Ib, primary adenocarcinoma of the lung stage IVa, hypothyroid, acid reflux, hyperlipidemia, depression, memory decline, aortic atherosclerosis, presents emergency department for chief concerns of weakness and hypoxia via EMS.   Clinical Impression   Patient presenting with decreased IND in self care,balance, functional mobility/transfer, endurance, and safety awareness. Patient reports living in independent living at [facility] but per chart review pt is in a respite home for memory care while her husband received short term rehab himself. Pt reports being independent with use of RW in community and IND in self care tasks but no family present to confirm.  Patient performed bed mobility with min A but needing mod A to stand and taking 2 steps B knee buckle requiring max A for safety before returning back to bed. Pt with limited insight to deficits this session. Patient will benefit from acute OT to increase overall independence in the areas of ADLs, functional mobility, and safety awareness in order to safely discharge to next venue of care.     Follow Up Recommendations  SNF;Supervision/Assistance - 24 hour    Equipment Recommendations  Other (comment) (defer to nexy venue of care)       Precautions / Restrictions Precautions Precautions: Fall Restrictions Weight Bearing Restrictions: No      Mobility Bed Mobility Overal bed mobility: Needs Assistance Bed Mobility: Supine to Sit;Sit to Supine     Supine to sit: Min assist Sit to supine: Min assist   General bed mobility comments: minA to reposition in sitting for patient comfort, anticipate pt would be able to complete without assist. returned to supine no assist. able to  reposition in bed with cues    Transfers Overall transfer level: Needs assistance Equipment used: 1 person hand held assist Transfers: Sit to/from [employer] Transfers Sit to Stand: Mod assist Stand pivot transfers: Max assist       General transfer comment: Pt with B LEs buckling in standing requiring max A to remain upright.    Balance Overall balance assessment: Needs assistance Sitting-balance support: Feet supported Sitting balance-Leahy Scale: Good     Standing balance support: During functional activity Standing balance-Leahy Scale: Poor Standing balance comment: pt declined standing numerous times                           ADL either performed or assessed with clinical judgement   ADL Overall ADL's : Needs assistance/impaired                         Toilet Transfer: Maximal assistance;BSC           Functional mobility during ADLs: Moderate assistance;Maximal assistance       Vision Baseline Vision/History: Wears glasses Wears Glasses: At all times Patient Visual Report: No change from baseline              Pertinent Vitals/Pain Pain Assessment: No/denies pain     Hand Dominance Right   Extremity/Trunk Assessment Upper Extremity Assessment Upper Extremity Assessment: Generalized weakness   Lower Extremity Assessment Lower Extremity Assessment: Generalized weakness   Cervical / Trunk Assessment Cervical / Trunk Assessment: Normal   Communication Communication Communication: No difficulties   Cognition Arousal/Alertness: Awake/alert Behavior During Therapy: Flat  affect Overall Cognitive Status: No family/caregiver present to determine baseline cognitive functioning                                 General Comments: Pt is oriented to self and location. She asks date and Ot's name x3 this session. Pt with decreased awareness of deficits.              Home Living Family/patient expects to be  discharged to:: Private residence (ind living at [facility]) Living Arrangements: Spouse/significant other Available Help at Discharge: Family;Available 24 hours/day Type of Home: House Home Access: Stairs to enter [employer] of Steps: threshold   Home Layout: One level     Bathroom Shower/Tub: Walk-in shower         Home Equipment: Environmental consultant - 2 wheels;Cane - single point;Wheelchair - Biomedical scientist Comments: denies falls      Prior Functioning/Environment Level of Independence: Independent with assistive device(s)  Gait / Transfers Assistance Needed: pt reported that she "sometimes" uses a RW. reported independence in ADLs.     Comments: Pt reports use of RW in community or on "bad days". Her and husband perform cooking together and cleaning of the kitchen. Someone comes in to clean and do laundry for them. She verbalized being I in ADLs.        OT Problem List: Decreased strength;Decreased activity tolerance;Impaired balance (sitting and/or standing);Decreased knowledge of use of DME or AE;Decreased safety awareness;Cardiopulmonary status limiting activity      OT Treatment/Interventions: Self-care/ADL training;Therapeutic exercise;Therapeutic activities;Energy conservation;DME and/or AE instruction;Patient/family education;Balance training;Modalities;Manual therapy    OT Goals(Current goals can be found in the care plan section) Acute Rehab OT Goals Patient Stated Goal: I've been thinking I need to get out of bed now OT Goal Formulation: With patient Time For Goal Achievement: 01/23/21 Potential to Achieve Goals: Good ADL Goals Pt Will Perform Grooming: with min assist;standing Pt Will Perform Lower Body Dressing: with min assist;sit to/from stand Pt Will Transfer to Toilet: with min assist;ambulating Pt Will Perform Toileting - Clothing Manipulation and hygiene: with min assist;sit to/from stand  OT Frequency: Min 2X/week   Barriers to  D/C:    none known at this time          AM-PAC OT "6 Clicks" Daily Activity     Outcome Measure Help from another person eating meals?: None Help from another person taking care of personal grooming?: None Help from another person toileting, which includes using toliet, bedpan, or urinal?: A Lot Help from another person bathing (including washing, rinsing, drying)?: A Lot Help from another person to put on and taking off regular upper body clothing?: A Little Help from another person to put on and taking off regular lower body clothing?: A Lot 6 Click Score: 17   End of Session Nurse Communication: Mobility status  Activity Tolerance: Patient limited by fatigue Patient left: in bed;with bed alarm set;with call bell/phone within reach  OT Visit Diagnosis: Unsteadiness on feet (R26.81);Muscle weakness (generalized) (M62.81)                Time: 3267-1245 OT Time Calculation (min): 21 min Charges:  OT General Charges $OT Visit: 1 Visit OT Evaluation $OT Eval Moderate Complexity: 1 Mod OT Treatments $Therapeutic Activity: 8-22 mins  Darleen Crocker, MS, OTR/L , CBIS ascom [phone]  01/09/21, 2:56 PM

## 2021-01-09 NOTE — Clinical Social Work Note (Signed)
RE: Jamie Matthews Date of Birth: Apr 03, 1941 Date: 01/09/2021   To Whom It May Concern:  Please be advised that the above-named patient will require a short-term nursing home stay - anticipated 30 days or less for rehabilitation and strengthening.  The plan is for return home.

## 2021-01-09 NOTE — Evaluation (Addendum)
Clinical/Bedside Swallow Evaluation Patient Details  Name: Jamie Matthews MRN: [id number] Date of Birth: 08-Dec-1940  Today's Date: 01/09/2021 Time: SLP Start Time (ACUTE ONLY): 0825 SLP Stop Time (ACUTE ONLY): 6599 SLP Time Calculation (min) (ACUTE ONLY): 60 min  Past Medical History:  Past Medical History:  Diagnosis Date   AKI (acute kidney injury) ([place]) 08/17/2018   Anxiety    Arthritis    Belching    Bladder disorder    OVERACTIVE   Bowel dysfunction    BLOCKAGE   Cancer ([place])    breast   Constipation    Depression    Diverticulitis    Fibromyalgia    GERD (gastroesophageal reflux disease)    Hyperlipidemia    IBS (irritable bowel syndrome)    Internal hemorrhoids    Lung cancer ([place]) 2020   Memory deficits    Murmur    asymptomatic   Pneumonia 11/18/12   Urinary incontinence    Vertigo    Past Surgical History:  Past Surgical History:  Procedure Laterality Date   AXILLARY LYMPH NODE BIOPSY Left 06/08/2018   INVASIVE MAMMARY CARCINOMA   BLADDER SUSPENSION  2004, 2012   BREAST BIOPSY Left 06/08/2018   INVASIVE MAMMARY CARCINOMA   CATARACT EXTRACTION W/PHACO Right 08/27/2015   Procedure: CATARACT EXTRACTION PHACO AND INTRAOCULAR LENS PLACEMENT ([place]);  Surgeon: Estill Cotta, MD;  Location: [facility] ORS;  Service: Ophthalmology;  Laterality: Right;  [place]   1:00.2 AP%  22.5 CDE  23.67 fluid casette lot #[id number]  exp05/31/2018   CATARACT EXTRACTION W/PHACO Left 10/15/2015   Procedure: CATARACT EXTRACTION PHACO AND INTRAOCULAR LENS PLACEMENT ([facility]);  Surgeon: Estill Cotta, MD;  Location: [facility] ORS;  Service: Ophthalmology;  Laterality: Left;  [place] 01:07 AP% 18.1 CDE 21.57 fluid pack lot # [id number]   CHOLECYSTECTOMY     COLONOSCOPY  2017   ELECTROMAGNETIC NAVIGATION BROCHOSCOPY N/A 07/09/2018   Procedure: ELECTROMAGNETIC NAVIGATION BRONCHOSCOPY;  Surgeon: Flora Lipps, MD;  Location: [facility] ORS;  Service: Cardiopulmonary;  Laterality: N/A;   TONSILLECTOMY   1947   HPI:  Pt is a 80 y.o. female with medical history significant for Memory Decline, left breast cancer stage Ib, primary adenocarcinoma of the lung stage IVa, hypothyroid, acid reflux, hyperlipidemia, depression, aortic atherosclerosis, presents emergency department for chief concerns of weakness and hypoxia via EMS.    At bedside patient was able to tell me her name, her age, location of hospital.     She endorses epigastric and mid abdominal pain with cough. She also endorses consitpation and that she has not had a bowel movement in 4 days. She denies nausea, vomiting, shortness of breath, chest pain.   She denies fever, known sick contacts. She reports she cough more int he evening and it is white saliva and/or white mucus that she coughs up. She and her husband recently went to [place] and came back 8/11-8/14. The congestion and cough started two days ago upon their return.  She also reports that during her trip to [place] [facility], she did not bring her MiraLAX and she has been constipated since then.   CT Angio of the Chest: New small bilateral pleural effusions.  3. Moderate Hiatal Hernia and diffusely distended esophagus with  air-fluid level, suggestive of gastroesophageal reflux.  4. Unchanged 2.3 cm faintly calcified solid pulmonary nodule in the  apical left upper lobe.  Previous MRI: Mild chronic small vessel ischemic disease and Moderate cerebral  atrophy.  OF NOTE: re: the Hiatal Hernia, pt endorses Globus  feelings and Regurgitation episodes at home == this increases her chance for aspiration of REFLUX material.  Educated pt on REFLUX precautions and need for f/u w/ GI for further education on Hiatal Hernias and dysmotility.   Assessment / Plan / Recommendation Clinical Impression  Pt appears to present w/ adequate oropharyngeal phase swallowing function w/ No overt oropharyngeal phase dysphagia appreciated w/ po trials; No neuromuscular swallowing deficits appreciated. Pt is at reduced  risk for aspiration from an oropharyngeal phase standpoint following general aspiration precautions. HOWEVER, pt has a baseline of GERD, on a PPI; and a MODERATE Hiatal Hernia per Imaging. ANY Dysmotility or Regurgitation of Reflux material can increase risk for aspiration of the Reflux material during Retrograde backflow thus impact Pulmonary status.  Pt described ongoing issues w/ "the food stopping in my chest" pointing to mid sternum area then described the discomfort moves superiorly pointing to the [place] area. She endorsed epsidoes of REGURGITATION at home.   Pt sat upright in bed this evaluation and consumed trials of her Breakfast w/ No overt clinical s/s of aspiration during oral intake; no coughing or wet vocal quality following the po trial, no decline in Pulmonary status. Oral phase appeared [facility] for bolus management, mastication, and oral clearing. However, halfway into the meal eating mostly french toast, she started c/o the "stuck" feeling in the chest and discomfort -- "it's stopping right here" pointing to sternum area.  Dry coughs noted == the REFLUX cough suspected as she had stopped eating/drinking. Rest Break given at that point to let the Esophagus clear. She endorsed the same along w/ Regurgitation at home == this increases her chance for aspiration of REFLUX material. When Globus feeling faded, she resumed eating -- though min impulsively w/out full awareness to continue to strategies of Slow eating, Alternating food/liquid, Rest Breaks. Verbal Cues given for follow through d/t pt's Baseline of Memory Decline -- Supervision at meals may be necessary. OM exam was [facility] for oral clearing; lingual/labial movements. No unilateral weakness. Speech clear.    Thorough Education given on REFLUX precautions and need for f/u w/ GI for further education on Hiatal Hernias and dysmotility; especially in light of Baseline Memory Decline.  Recommend a Mech Soft diet w/ chopped meats and moistened  foods(less meats and breads in diet); thin liquids via cup. General aspiration precautions and REFLUX precautions. Pills in a puree if helpful for clearing of the Esophagus. NSG/MD updated. SLP Visit Diagnosis: Dysphagia, unspecified (R13.10) (Esophageal dysmotililty)    Aspiration Risk   (reduced from an oropharyngeal phase standpoint)    Diet Recommendation   Mech Soft diet w/ chopped meats and moistened foods(LESS meats and breads in diet); thin liquids VIA CUP - Less Straw use. General aspiration precautions and REFLUX precautions. Rest breaks during meals to allow for clearing of the Esophagus.  Medication Administration: Whole meds with puree (if needed)    Other  Recommendations Recommended Consults: Consider GI evaluation;Consider esophageal assessment (MOD Hiatal Hernia per Imaging) Oral Care Recommendations: Oral care BID;Patient independent with oral care Other Recommendations:  (n/a)   Follow up Recommendations None      Frequency and Duration  (n/a)   (n/a)       Prognosis Barriers to Reach Goals: Time post onset;Severity of deficits (MOD Hiatal Hernia) Barriers/Prognosis Comment: MOD Hiatal Hernia      Swallow Study   General Date of Onset: 01/08/21 HPI: Pt is a 80 y.o. female with medical history significant for left breast cancer stage Ib, primary  adenocarcinoma of the lung stage IVa, hypothyroid, acid reflux, hyperlipidemia, depression, memory decline, aortic atherosclerosis, presents emergency department for chief concerns of weakness and hypoxia via EMS.    At bedside patient was able to tell me her name, her age, location of hospital.     She endorses epigastric and mid abdominal pain with cough. She also endorses consitpation and that she has not had a bowel movement in 4 days. She denies nausea, vomiting, shortness of breath, chest pain.   She denies fever, known sick contacts. She reports she cough more int he evening and it is white saliva and/or white mucus that  she coughs up. She and her husband recently went to [place] and came back 8/11-8/14. The congestion and cough started two days ago upon their return.  She also reports that during her trip to [facility], she did not bring her MiraLAX and she has been constipated since then.   CT Angio of the Chest: New small bilateral pleural effusions.  3. Moderate Hiatal Hernia and diffusely distended esophagus with  air-fluid level, suggestive of gastroesophageal reflux.  4. Unchanged 2.3 cm faintly calcified solid pulmonary nodule in the  apical left upper lobe.  Previous MRI: Mild chronic small vessel ischemic disease and Moderate cerebral  atrophy.  OF NOTE: re: the Hiatal Hernia, pt endorses Globus feelings and Regurgitation episodes at home == this increases her chance for aspiration of REFLUX material.  Educated pt on REFLUX precautions and need for f/u w/ GI for further education on Hiatal Hernias and dysmotility. Type of Study: Bedside Swallow Evaluation Previous Swallow Assessment: none Diet Prior to this Study: Regular;Thin liquids Temperature Spikes Noted: No (wbc 11.4 declining) Respiratory Status: Nasal cannula (2L) History of Recent Intubation: No Behavior/Cognition: Alert;Cooperative;Pleasant mood;Distractible;Requires cueing (min) Oral Cavity Assessment: Within Functional Limits Oral Care Completed by SLP: Recent completion by staff Oral Cavity - Dentition: Adequate natural dentition Vision: Functional for self-feeding Self-Feeding Abilities: Able to feed self;Needs assist;Needs set up Patient Positioning: Upright in bed (needed support) Baseline Vocal Quality: Normal Volitional Cough: Strong Volitional Swallow: Able to elicit    Oral/Motor/Sensory Function Overall Oral Motor/Sensory Function: Within functional limits   Ice Chips Ice chips: Within functional limits Presentation: Spoon (fed; 2 trials)   Thin Liquid Thin Liquid: Within functional limits Presentation: Cup;Self Fed (~4-5  ozs total) Other Comments: water, coffee    Nectar Thick Nectar Thick Liquid: Not tested   Honey Thick Honey Thick Liquid: Not tested   Puree Puree: Within functional limits Presentation: Self Fed;Spoon (3-4 ozs)   Solid     Solid: Within functional limits Presentation: Self Fed;Spoon (all french toast at breakfast) Other Comments: endorsed Globus feelings        Orinda Kenner, [place], [place] Speech Language Pathologist Rehab Services [phone] Makahla Kiser 01/09/2021,5:15 PM

## 2021-01-09 NOTE — Evaluation (Addendum)
Physical Therapy Evaluation Patient Details Name: Jamie Matthews MRN: [id number] DOB: Sep 24, 1940 Today's Date: 01/09/2021   History of Present Illness  Pt is an 80 y.o. female with medical history significant for left breast cancer stage Ib, primary adenocarcinoma of the lung stage IVa, hypothyroid, acid reflux, hyperlipidemia, depression, memory decline, aortic atherosclerosis, presents emergency department for chief concerns of weakness and hypoxia via EMS.   Clinical Impression  Pt alert, oriented to self, very resistant to PT services. Reported at baseline she is independent for ADLs, lives with her husband who she reported is available nearly 24/7, and stated she occasionally uses a walker.   The patient performed bed mobility with minA for pt comfort, anticipate that she would be able to perform without assist. Returned to supine with supervision. She was able to sit EOB for several minutes, move her Ues and Les against gravity, as well as brush her teeth. Pt declining any OOB mobility or standing despite education and encouragement. Evaluation limited due to this, but per OT pt experienced LOB up in standing and needed significant assist. Recommendation at this time is SNF due to current level of assistance needed and safety concerns.    Follow Up Recommendations SNF    Equipment Recommendations  None recommended by PT    Recommendations for Other Services       Precautions / Restrictions Precautions Precautions: Fall Restrictions Weight Bearing Restrictions: No      Mobility  Bed Mobility Overal bed mobility: Needs Assistance Bed Mobility: Supine to Sit;Sit to Supine     Supine to sit: Min assist Sit to supine: Min guard   General bed mobility comments: minA to reposition in sitting for patient comfort, anticipate pt would be able to complete without assist. returned to supine no assist. able to reposition in bed with cues    Transfers                  General transfer comment: pt declined any OOB mobility  Ambulation/Gait                Stairs            Wheelchair Mobility    Modified Rankin (Stroke Patients Only)       Balance Overall balance assessment: Needs assistance Sitting-balance support: Feet supported Sitting balance-Leahy Scale: Good         Standing balance comment: pt declined standing numerous times                             Pertinent Vitals/Pain Pain Assessment: No/denies pain    Home Living Family/patient expects to be discharged to:: Private residence (independent living at [facility]) Living Arrangements: Spouse/significant other Available Help at Discharge: Family;Available 24 hours/day (nearly 24/7 assist) Type of Home: House Home Access: Stairs to enter   [employer] of Steps: threshold Home Layout: One level Home Equipment: Walker - 2 wheels;Cane - single point;Wheelchair - manual Additional Comments: denied falls    Prior Function Level of Independence: Independent   Gait / Transfers Assistance Needed: pt reported that she "sometimes" uses a RW. reported independence in ADLs.           Hand Dominance        Extremity/Trunk Assessment   Upper Extremity Assessment Upper Extremity Assessment:  (able to lift UE's against gravity)    Lower Extremity Assessment Lower Extremity Assessment:  (able to lift LEs against gravity on  cue)    Cervical / Trunk Assessment Cervical / Trunk Assessment: Normal  Communication   Communication: No difficulties  Cognition Arousal/Alertness: Awake/alert Behavior During Therapy: Flat affect Overall Cognitive Status: No family/caregiver present to determine baseline cognitive functioning                                 General Comments: pt oriented to self, place. impuslive with movements, agreeable to mobility with max encouragement      General Comments      Exercises      Assessment/Plan    PT Assessment Patient needs continued PT services  PT Problem List Decreased strength;Decreased mobility;Decreased activity tolerance;Decreased balance       PT Treatment Interventions DME instruction;Therapeutic exercise;Gait training;Balance training;Stair training;Neuromuscular re-education;Functional mobility training;Therapeutic activities;Patient/family education    PT Goals (Current goals can be found in the Care Plan section)  Acute Rehab PT Goals Patient Stated Goal: to rest PT Goal Formulation: With patient Time For Goal Achievement: 01/23/21 Potential to Achieve Goals: Good    Frequency Min 2X/week   Barriers to discharge        Co-evaluation               AM-PAC PT "6 Clicks" Mobility  Outcome Measure Help needed turning from your back to your side while in a flat bed without using bedrails?: A Little Help needed moving from lying on your back to sitting on the side of a flat bed without using bedrails?: A Little Help needed moving to and from a bed to a chair (including a wheelchair)?: A Little Help needed standing up from a chair using your arms (e.g., wheelchair or bedside chair)?: A Little Help needed to walk in hospital room?: A Little Help needed climbing 3-5 steps with a railing? : A Little 6 Click Score: 18    End of Session Equipment Utilized During Treatment: Oxygen Activity Tolerance: Other (comment) (pt self limiting) Patient left: in bed;with call bell/phone within reach;with bed alarm set Nurse Communication: Mobility status PT Visit Diagnosis: Other abnormalities of gait and mobility (R26.89)    Time: 7342-8768 PT Time Calculation (min) (ACUTE ONLY): 11 min   Charges:   PT Evaluation $PT Eval Low Complexity: 1 Low         Lieutenant Diego PT, DPT 12:35 PM,01/09/21

## 2021-01-10 LAB — RESP PANEL BY RT-PCR (FLU A&B, COVID) ARPGX2
Influenza A by PCR: NEGATIVE
Influenza B by PCR: NEGATIVE
SARS Coronavirus 2 by RT PCR: NEGATIVE

## 2021-01-10 LAB — GLUCOSE, CAPILLARY: Glucose-Capillary: 167 mg/dL — ABNORMAL HIGH (ref 70–99)

## 2021-01-10 LAB — PROCALCITONIN: Procalcitonin: 0.11 ng/mL

## 2021-01-10 MED ORDER — POLYETHYLENE GLYCOL 3350 17 G PO PACK: 17.0000 g | PACK | Freq: Two times a day (BID) | ORAL | 0 refills | Status: DC

## 2021-01-10 MED ORDER — PREDNISONE 20 MG PO TABS: 10.0000 mg | ORAL_TABLET | Freq: Every day | ORAL | Status: DC

## 2021-01-10 MED ORDER — ALUM & MAG HYDROXIDE-SIMETH 200-200-20 MG/5ML PO SUSP: 30.0000 mL | Freq: Four times a day (QID) | ORAL | 0 refills | Status: DC | PRN

## 2021-01-10 NOTE — Plan of Care (Signed)
Maalox given x1 prn for c/o indigestion. Denies other pain or n/v. VSS on 2L via [place]. Urine output adequate via purewick. Remained free from falls or injury. Call bell within reach and able to use.   Problem: Clinical Measurements: Goal: Respiratory complications will improve Outcome: Progressing   Problem: Activity: Goal: Risk for activity intolerance will decrease Outcome: Progressing   Problem: Nutrition: Goal: Adequate nutrition will be maintained Outcome: Progressing   Problem: Coping: Goal: Level of anxiety will decrease Outcome: Progressing   Problem: Elimination: Goal: Will not experience complications related to bowel motility Outcome: Progressing   Problem: Pain Managment: Goal: General experience of comfort will improve Outcome: Progressing

## 2021-01-10 NOTE — TOC Progression Note (Signed)
Transition of Care [facility]) - Progression Note    Patient Details  Name: Jamie Matthews MRN: [id number] Date of Birth: [birth year]  Transition of Care [facility]) CM/SW [place], RN Phone Number: 01/10/2021, 2:39 PM  Clinical Narrative:   Patient can go to [id number], as per Seth Bake.  COVID test ordered, awaiting results.  TOC to follow to discharge     Expected Discharge Plan: [place] Barriers to Discharge: Continued Medical Work up  Expected Discharge Plan and Services Expected Discharge Plan: [place] Choice: [place] arrangements for the past 2 months: [place] Expected Discharge Date: 01/10/21                                     Social Determinants of Health (SDOH) Interventions    Readmission Risk Interventions Readmission Risk Prevention Plan 10/10/2018  Post Dischage Appt Complete  Medication Screening Complete  Transportation Screening Complete  Some recent data might be hidden

## 2021-01-10 NOTE — Discharge Summary (Addendum)
Jamie Matthews [place] YIR:[id number] DOB: 1941/04/26 DOA: 01/08/2021  PCP: Jamie Noe, MD  Admit date: 01/08/2021 Discharge date: 01/11/2021  Admitted From: [facility] Disposition:  snf  Recommendations for Outpatient Follow-up:  Follow up with PCP in 1 week Please obtain BMP/CBC in one week       Discharge Condition:Stable CODE STATUS:DNR  Diet recommendation:  Aspiration Risk    (reduced from an oropharyngeal phase standpoint)     Diet Recommendation   Mech Soft diet w/ chopped meats and moistened foods(LESS meats and breads in diet); thin liquids VIA CUP - Less Straw use. General aspiration precautions and REFLUX precautions. Rest breaks during meals to allow for clearing of the Esophagus.Heart healthy    Heart Healthy / Carb Modified / Regular / Dysphagia  Brief/Interim Summary: Jamie Matthews is a 80 y.o. female with medical history significant for left breast cancer stage Ib, primary adenocarcinoma of the lung stage IVa, hypothyroid, acid reflux, hyperlipidemia, depression, memory decline, aortic atherosclerosis, presents emergency department for chief concerns of weakness and hypoxia via EMS.COVID/influenza A/influenza B PCR were negative.When EMS/first responder arrived, patient was hypoxic with SPO2 in the high 80s.  Patient was placed on 4 L nasal cannula by EMS.Initial vitals in the ED was remarkable for temperature 99.1, respiration rate of 16, heart rate 62, blood pressure 112/60, SPO2 of 95% on 2 L nasal cannula.  Addendum: 8/18- pt was not able to leave yesterday as snf was not ready with her bed. No overnight issues. No complaints this am. She is stable for dc.  CTA chest obtained: IMPRESSION: 1. No evidence of pulmonary embolism. 2. New small bilateral pleural effusions. 3. New moderate hiatal hernia and diffusely distended esophagus with air-fluid level, suggestive of gastroesophageal reflux. 4. Unchanged 2.3 cm faintly calcified solid  pulmonary nodule in the apical left upper lobe. 5. Aortic Atherosclerosis   Patient was admitted to the hospital.   # Acute respiratory failure with Hypoxia and shortness of breath- Possible due to URI Improved with iv steroid, start prednisone taper on 01/11/2021.  Prednisone 40 mg p.o. daily x2 days, then 20 mg p.o. daily x2 days then stop Procalcitonin less than <0.10, will not start abx at this time Keep 02 sat above 92% with 02 supplementation if needed Incentive spirometer        # Leukocytosis- Trending down  Blood cultures to date negative UA negative Possibly reactive/steroids     # History of constipation- Resolved , continue bowel regimen       # Does not meet criteria for sepsis-sepsis ruled out   # GERD-continue PPI  # Hypothyroid-on levothyroxine    # History of left breast cancer, stage Ib-resumed home dosing of Arimidex 1 mg daily Follow-up with oncologist  # Primary adenocarcinoma of the lung, stage IVa, on crizotinib  Medical oncology will arrange for outpatient follow-up.   # Hyperlipidemia -patient states she does not take atorvastatin 20 mg daily, this has not been resumed   # Depression/anxiety- bupropion    # History of memory decline-continue Namenda    # Aortic atherosclerosis-outpatient follow-up    Discharge Diagnoses:  Principal Problem:   Shortness of breath Active Problems:   Gastroesophageal reflux disease   Hyperlipidemia   DNR (do not resuscitate)   OA (osteoarthritis) of knee   Chronic cough   Fatigue   Depression, recurrent (HCC)   Decreased appetite   IBS (irritable bowel syndrome)   Primary adenocarcinoma of lung (HCC)   Malignant neoplasm  of left female breast ([place])   Aromatase inhibitor use   Aortic valve stenosis    Discharge Instructions  Discharge Instructions     Call MD for:  temperature >100.4   Complete by: As directed    Diet - low sodium heart healthy   Complete by: As directed    Increase  activity slowly   Complete by: As directed       Allergies as of 01/11/2021       Reactions   Sulfa Antibiotics Rash        Medication List     STOP taking these medications    desonide 0.05 % lotion Commonly known as: DESOWEN   doxycycline 100 MG capsule Commonly known as: MONODOX   ketoconazole 2 % shampoo Commonly known as: NIZORAL   Xalkori 250 MG capsule Generic drug: crizotinib       TAKE these medications    acetaminophen 325 MG tablet Commonly known as: TYLENOL Take 325 mg by mouth every 6 (six) hours as needed for mild pain.   albuterol 108 (90 Base) MCG/ACT inhaler Commonly known as: VENTOLIN HFA Inhale 2 puffs into the lungs every 6 (six) hours as needed for wheezing or shortness of breath.   alum & mag hydroxide-simeth 200-200-20 MG/5ML suspension Commonly known as: MAALOX/MYLANTA Take 30 mLs by mouth every 6 (six) hours as needed for indigestion or heartburn.   anastrozole 1 MG tablet Commonly known as: ARIMIDEX Take 1 tablet (1 mg total) by mouth daily.   atorvastatin 20 MG tablet Commonly known as: LIPITOR TAKE 1 TABLET BY MOUTH NIGHTLY   benzonatate 100 MG capsule Commonly known as: TESSALON TAKE 1 CAPSULE BY MOUTH 3 TIMES DAILY ASNEEDED FOR COUGH.   buPROPion 150 MG 24 hr tablet Commonly known as: WELLBUTRIN XL TAKE ONE TABLET BY MOUTH EVERY DAY   CALCIUM 1200+D3 PO Take 1 tablet by mouth daily.   docusate sodium 100 MG capsule Commonly known as: COLACE TAKE 1 CAPSULE BY MOUTH DAILY   fluticasone 50 MCG/ACT nasal spray Commonly known as: FLONASE Place 2 sprays into both nostrils daily.   levothyroxine 25 MCG tablet Commonly known as: SYNTHROID TAKE 1 TABLET BY MOUTH DAILY   lubiprostone 24 MCG capsule Commonly known as: Amitiza TAKE ONE CAPSULE TWICE A DAY WITH MEALS   memantine 10 MG tablet Commonly known as: NAMENDA Take 1 tablet (10 mg total) by mouth 2 (two) times daily.   mirabegron ER 50 MG Tb24  tablet Commonly known as: Myrbetriq Take 1 tablet (50 mg total) by mouth daily. For overactive bladder.   Mucinex DM 30-600 MG Tb12 Take 1 tablet by mouth 2 (two) times daily as needed (for congestion/cough).   multivitamin with minerals Tabs tablet Take 1 tablet by mouth daily.   omeprazole 40 MG capsule Commonly known as: PRILOSEC Take 40 mg by mouth daily.   ondansetron 8 MG tablet Commonly known as: ZOFRAN TAKE ONE TABLET EVERY EIGHT HOURS AS NEEDED FOR NAUSEA / VOMITING   polyethylene glycol 17 g packet Commonly known as: MIRALAX / GLYCOLAX Take 17 g by mouth 2 (two) times daily. What changed:  when to take this reasons to take this   predniSONE 20 MG tablet Commonly known as: DELTASONE Take 0.5 tablets (10 mg total) by mouth daily. Take 40 mg starting on 8/19 x2 days, then 20mg  po daily x2 days then stop What changed:  medication strength additional instructions   venlafaxine XR 150 MG 24 hr capsule Commonly known as:  EFFEXOR-XR TAKE 1 CAPSULE BY MOUTH EVERY DAY   Vitamin D (Ergocalciferol) 1.25 MG (50000 UNIT) Caps capsule Commonly known as: DRISDOL Take 50,000 Units by mouth every Sunday at [place], MD Follow up in 1 week(s).   Specialty: Family Medicine Contact information: [place] [address] [phone]                Allergies  Allergen Reactions   Sulfa Antibiotics Rash    Consultations:    Procedures/Studies: DG Chest 2 View  Result Date: 12/28/2020 CLINICAL DATA:  Cough, wheezing and shortness of breath 1 week. EXAM: CHEST - 2 VIEW COMPARISON:  Chest CT 09/21/2020 FINDINGS: The cardiac silhouette, mediastinal and hilar contours are within normal limits and stable. Stable left upper lobe pulmonary lesion. Stable left upper lobe calcified granulomas. Stable mild eventration of the right hemidiaphragm. Mild streaky bibasilar atelectasis but no definite infiltrates or  edema. Suspect small bilateral pleural effusions on the lateral film. Remote healed rib fractures are noted. IMPRESSION: 1. Stable left upper lobe pulmonary lesion. 2. Suspect small bilateral pleural effusions and bibasilar atelectasis. Electronically Signed   By: Marijo Sanes M.D.   On: 12/28/2020 15:35   CT Angio Chest PE W and/or Wo Contrast  Result Date: 01/08/2021 CLINICAL DATA:  Cough, congestion, shortness of breath. History of lung cancer and breast cancer. EXAM: CT ANGIOGRAPHY CHEST WITH CONTRAST TECHNIQUE: Multidetector CT imaging of the chest was performed using the standard protocol during bolus administration of intravenous contrast. Multiplanar CT image reconstructions and MIPs were obtained to evaluate the vascular anatomy. CONTRAST:  76mL OMNIPAQUE IOHEXOL 350 MG/ML SOLN COMPARISON:  CT chest dated September 21, 2020. FINDINGS: Cardiovascular: Satisfactory opacification of the pulmonary arteries to the segmental level. No evidence of pulmonary embolism. Normal heart size. No pericardial effusion. No thoracic aortic aneurysm or dissection. Mild atherosclerotic calcification of the aortic arch. Mediastinum/Nodes: No enlarged mediastinal, hilar, or axillary lymph nodes. Thyroid gland, and trachea demonstrate no significant findings. New moderate hiatal hernia and diffusely distended esophagus with air-fluid level. Lungs/Pleura: New small left greater than right pleural effusions. No consolidation or pneumothorax. Mild subsegmental atelectasis in the left lower lobe. Unchanged faintly calcified 2.3 x 2.0 cm nodule in the apical left upper lobe (series 7, image 20). Unchanged 5 mm nodule in the right middle lobe (series 7, image 39). Upper Abdomen: No acute abnormality. Large amount of stool in the visualized colon. Musculoskeletal: No chest wall abnormality. No acute or significant osseous findings. Chronic T9 and T11 compression deformities again noted. Multiple old right-sided rib fractures again  noted. Review of the MIP images confirms the above findings. IMPRESSION: 1. No evidence of pulmonary embolism. 2. New small bilateral pleural effusions. 3. New moderate hiatal hernia and diffusely distended esophagus with air-fluid level, suggestive of gastroesophageal reflux. 4. Unchanged 2.3 cm faintly calcified solid pulmonary nodule in the apical left upper lobe. 5. Aortic Atherosclerosis (ICD10-I70.0). Electronically Signed   By: Titus Dubin M.D.   On: 01/08/2021 12:50   DG Chest Portable 1 View  Result Date: 01/08/2021 CLINICAL DATA:  Shortness of breath and cough EXAM: PORTABLE CHEST 1 VIEW COMPARISON:  Eleven days ago FINDINGS: Low volume chest. Lucency under the right diaphragm has the appearance of colon. There is no edema, consolidation, effusion, or pneumothorax. Retrocardiac density is from tortuous aorta. Chronic nodular density at the left apex. IMPRESSION: Stable low volume chest.  No focal pneumonia. Electronically Signed   By: Monte Fantasia M.D.   On: 01/08/2021 11:25      Subjective: No complaints. Feels better. No sob.  Discharge Exam: Vitals:   01/11/21 0604 01/11/21 0833  BP: (!) 142/66 (!) 133/56  Pulse: (!) 54 (!) 52  Resp: 18 18  Temp: 97.8 F (36.6 C) 97.7 F (36.5 C)  SpO2: 95% 94%   Vitals:   01/10/21 2011 01/10/21 2352 01/11/21 0604 01/11/21 0833  BP: (!) 145/57 140/63 (!) 142/66 (!) 133/56  Pulse: (!) 57 (!) 58 (!) 54 (!) 52  Resp: 18 20 18 18   Temp: 98.2 F (36.8 C) 98.3 F (36.8 C) 97.8 F (36.6 C) 97.7 F (36.5 C)  TempSrc:  Oral  Oral  SpO2: 95% 93% 95% 94%  Weight:      Height:        General: Pt is alert, awake, not in acute distress Cardiovascular: RRR, S1/S2 +, no rubs, no gallops Respiratory: CTA bilaterally, no wheezing, no rhonchi Abdominal: Soft, NT, ND, bowel sounds + Extremities: no edem    The results of significant diagnostics from this hospitalization (including imaging, microbiology, ancillary and laboratory) are  listed below for reference.     Microbiology: Recent Results (from the past 240 hour(s))  Blood culture (routine x 2)     Status: None (Preliminary result)   Collection Time: 01/08/21 11:11 AM   Specimen: BLOOD  Result Value Ref Range Status   Specimen Description BLOOD LEFT ANTECUBITAL  Final   Special Requests   Final    BOTTLES DRAWN AEROBIC ONLY Blood Culture adequate volume   Culture   Final    NO GROWTH 3 DAYS Performed at [facility], [address] [place], [place] [address]    Report Status PENDING  Incomplete  Blood culture (routine x 2)     Status: None (Preliminary result)   Collection Time: 01/08/21 11:11 AM   Specimen: BLOOD  Result Value Ref Range Status   Specimen Description BLOOD RIGHT ANTECUBITAL  Final   Special Requests   Final    BOTTLES DRAWN AEROBIC AND ANAEROBIC Blood Culture results may not be optimal due to an inadequate volume of blood received in culture bottles   Culture   Final    NO GROWTH 3 DAYS Performed at [facility], [address], [place], [place] [address]    Report Status PENDING  Incomplete  Resp Panel by RT-PCR (Flu A&B, Covid) Nasopharyngeal Swab     Status: None   Collection Time: 01/08/21 11:11 AM   Specimen: Nasopharyngeal Swab; Nasopharyngeal(NP) swabs in vial transport medium  Result Value Ref Range Status   SARS Coronavirus 2 by RT PCR NEGATIVE NEGATIVE Final    Comment: (NOTE) SARS-CoV-2 target nucleic acids are NOT DETECTED.  The SARS-CoV-2 RNA is generally detectable in upper respiratory specimens during the acute phase of infection. The lowest concentration of SARS-CoV-2 viral copies this assay can detect is 138 copies/mL. A negative result does not preclude SARS-Cov-2 infection and should not be used as the sole basis for treatment or other patient management decisions. A negative result may occur with  improper specimen collection/handling, submission of specimen other than nasopharyngeal swab,  presence of viral mutation(s) within the areas targeted by this assay, and inadequate number of viral copies(<138 copies/mL). A negative result must be combined with clinical observations, patient history, and epidemiological information. The expected result is Negative.  Fact Sheet for Patients:  EntrepreneurPulse.com.au  Fact  Sheet for Healthcare Providers:  IncredibleEmployment.be  This test is no t yet approved or cleared by the [place] FDA and  has been authorized for detection and/or diagnosis of SARS-CoV-2 by FDA under an Emergency Use Authorization (EUA). This EUA will remain  in effect (meaning this test can be used) for the duration of the COVID-19 declaration under Section 564(b)(1) of the Act, 21 U.S.C.section 360bbb-3(b)(1), unless the authorization is terminated  or revoked sooner.       Influenza A by PCR NEGATIVE NEGATIVE Final   Influenza B by PCR NEGATIVE NEGATIVE Final    Comment: (NOTE) The Xpert Xpress SARS-CoV-2/FLU/RSV plus assay is intended as an aid in the diagnosis of influenza from Nasopharyngeal swab specimens and should not be used as a sole basis for treatment. Nasal washings and aspirates are unacceptable for Xpert Xpress SARS-CoV-2/FLU/RSV testing.  Fact Sheet for Patients: EntrepreneurPulse.com.au  Fact Sheet for Healthcare Providers: IncredibleEmployment.be  This test is not yet approved or cleared by the [place] FDA and has been authorized for detection and/or diagnosis of SARS-CoV-2 by FDA under an Emergency Use Authorization (EUA). This EUA will remain in effect (meaning this test can be used) for the duration of the COVID-19 declaration under Section 564(b)(1) of the Act, 21 U.S.C. section 360bbb-3(b)(1), unless the authorization is terminated or revoked.  Performed at [facility], [address] [place], [place] [address]   Resp Panel by  RT-PCR (Flu A&B, Covid) Nasopharyngeal Swab     Status: None   Collection Time: 01/10/21  5:30 PM   Specimen: Nasopharyngeal Swab; Nasopharyngeal(NP) swabs in vial transport medium  Result Value Ref Range Status   SARS Coronavirus 2 by RT PCR NEGATIVE NEGATIVE Final    Comment: (NOTE) SARS-CoV-2 target nucleic acids are NOT DETECTED.  The SARS-CoV-2 RNA is generally detectable in upper respiratory specimens during the acute phase of infection. The lowest concentration of SARS-CoV-2 viral copies this assay can detect is 138 copies/mL. A negative result does not preclude SARS-Cov-2 infection and should not be used as the sole basis for treatment or other patient management decisions. A negative result may occur with  improper specimen collection/handling, submission of specimen other than nasopharyngeal swab, presence of viral mutation(s) within the areas targeted by this assay, and inadequate number of viral copies(<138 copies/mL). A negative result must be combined with clinical observations, patient history, and epidemiological information. The expected result is Negative.  Fact Sheet for Patients:  EntrepreneurPulse.com.au  Fact Sheet for Healthcare Providers:  IncredibleEmployment.be  This test is no t yet approved or cleared by the [place] FDA and  has been authorized for detection and/or diagnosis of SARS-CoV-2 by FDA under an Emergency Use Authorization (EUA). This EUA will remain  in effect (meaning this test can be used) for the duration of the COVID-19 declaration under Section 564(b)(1) of the Act, 21 U.S.C.section 360bbb-3(b)(1), unless the authorization is terminated  or revoked sooner.       Influenza A by PCR NEGATIVE NEGATIVE Final   Influenza B by PCR NEGATIVE NEGATIVE Final    Comment: (NOTE) The Xpert Xpress SARS-CoV-2/FLU/RSV plus assay is intended as an aid in the diagnosis of influenza from Nasopharyngeal swab  specimens and should not be used as a sole basis for treatment. Nasal washings and aspirates are unacceptable for Xpert Xpress SARS-CoV-2/FLU/RSV testing.  Fact Sheet for Patients: EntrepreneurPulse.com.au  Fact Sheet for Healthcare Providers: IncredibleEmployment.be  This test is not yet approved or cleared by the [place] FDA  and has been authorized for detection and/or diagnosis of SARS-CoV-2 by FDA under an Emergency Use Authorization (EUA). This EUA will remain in effect (meaning this test can be used) for the duration of the COVID-19 declaration under Section 564(b)(1) of the Act, 21 U.S.C. section 360bbb-3(b)(1), unless the authorization is terminated or revoked.  Performed at [facility], [address] [place], [place] [address]      Labs: BNP (last 3 results) Recent Labs    01/08/21 0837  BNP 35.4   Basic Metabolic Panel: Recent Labs  Lab 01/08/21 0837 01/09/21 0507  NA 133* 135  K 4.1 4.5  CL 98 101  CO2 26 27  GLUCOSE 139* 164*  BUN 29* 26*  CREATININE 1.23* 1.14*  CALCIUM 7.6* 7.4*   Liver Function Tests: Recent Labs  Lab 01/08/21 0837  AST 40  ALT 38  ALKPHOS 70  BILITOT 0.7  PROT 5.3*  ALBUMIN 2.8*   Recent Labs  Lab 01/08/21 0837  LIPASE 38   No results for input(s): AMMONIA in the last 168 hours. CBC: Recent Labs  Lab 01/08/21 0837 01/09/21 0507  WBC 13.9* 11.4*  HGB 11.5* 10.8*  HCT 34.5* 32.1*  MCV 95.6 94.4  PLT 336 360   Cardiac Enzymes: No results for input(s): CKTOTAL, CKMB, CKMBINDEX, TROPONINI in the last 168 hours. BNP: Invalid input(s): POCBNP CBG: Recent Labs  Lab 01/10/21 0505  GLUCAP 167*   D-Dimer No results for input(s): DDIMER in the last 72 hours. Hgb A1c No results for input(s): HGBA1C in the last 72 hours. Lipid Profile No results for input(s): CHOL, HDL, LDLCALC, TRIG, CHOLHDL, LDLDIRECT in the last 72 hours. Thyroid function studies No  results for input(s): TSH, T4TOTAL, T3FREE, THYROIDAB in the last 72 hours.  Invalid input(s): FREET3 Anemia work up No results for input(s): VITAMINB12, FOLATE, FERRITIN, TIBC, IRON, RETICCTPCT in the last 72 hours. Urinalysis    Component Value Date/Time   COLORURINE YELLOW (A) 01/08/2021 1300   APPEARANCEUR HAZY (A) 01/08/2021 1300   APPEARANCEUR Clear 09/23/2019 1103   LABSPEC 1.021 01/08/2021 1300   LABSPEC 1.027 06/10/2014 1759   PHURINE 7.0 01/08/2021 1300   GLUCOSEU NEGATIVE 01/08/2021 1300   GLUCOSEU Negative 06/10/2014 1759   HGBUR NEGATIVE 01/08/2021 1300   BILIRUBINUR NEGATIVE 01/08/2021 1300   BILIRUBINUR Negative 09/23/2019 1103   BILIRUBINUR Negative 06/10/2014 1759   KETONESUR NEGATIVE 01/08/2021 1300   PROTEINUR NEGATIVE 01/08/2021 1300   UROBILINOGEN 0.2 08/20/2017 1134   NITRITE NEGATIVE 01/08/2021 1300   LEUKOCYTESUR NEGATIVE 01/08/2021 1300   LEUKOCYTESUR Trace 06/10/2014 1759   Sepsis Labs Invalid input(s): PROCALCITONIN,  WBC,  LACTICIDVEN Microbiology Recent Results (from the past 240 hour(s))  Blood culture (routine x 2)     Status: None (Preliminary result)   Collection Time: 01/08/21 11:11 AM   Specimen: BLOOD  Result Value Ref Range Status   Specimen Description BLOOD LEFT ANTECUBITAL  Final   Special Requests   Final    BOTTLES DRAWN AEROBIC ONLY Blood Culture adequate volume   Culture   Final    NO GROWTH 3 DAYS Performed at [facility], [address] [place], [place] [address]    Report Status PENDING  Incomplete  Blood culture (routine x 2)     Status: None (Preliminary result)   Collection Time: 01/08/21 11:11 AM   Specimen: BLOOD  Result Value Ref Range Status   Specimen Description BLOOD RIGHT ANTECUBITAL  Final   Special Requests   Final  BOTTLES DRAWN AEROBIC AND ANAEROBIC Blood Culture results may not be optimal due to an inadequate volume of blood received in culture bottles   Culture   Final    NO GROWTH 3  DAYS Performed at [facility], [address] [place], [place] [address]    Report Status PENDING  Incomplete  Resp Panel by RT-PCR (Flu A&B, Covid) Nasopharyngeal Swab     Status: None   Collection Time: 01/08/21 11:11 AM   Specimen: Nasopharyngeal Swab; Nasopharyngeal(NP) swabs in vial transport medium  Result Value Ref Range Status   SARS Coronavirus 2 by RT PCR NEGATIVE NEGATIVE Final    Comment: (NOTE) SARS-CoV-2 target nucleic acids are NOT DETECTED.  The SARS-CoV-2 RNA is generally detectable in upper respiratory specimens during the acute phase of infection. The lowest concentration of SARS-CoV-2 viral copies this assay can detect is 138 copies/mL. A negative result does not preclude SARS-Cov-2 infection and should not be used as the sole basis for treatment or other patient management decisions. A negative result may occur with  improper specimen collection/handling, submission of specimen other than nasopharyngeal swab, presence of viral mutation(s) within the areas targeted by this assay, and inadequate number of viral copies(<138 copies/mL). A negative result must be combined with clinical observations, patient history, and epidemiological information. The expected result is Negative.  Fact Sheet for Patients:  EntrepreneurPulse.com.au  Fact Sheet for Healthcare Providers:  IncredibleEmployment.be  This test is no t yet approved or cleared by the [place] FDA and  has been authorized for detection and/or diagnosis of SARS-CoV-2 by FDA under an Emergency Use Authorization (EUA). This EUA will remain  in effect (meaning this test can be used) for the duration of the COVID-19 declaration under Section 564(b)(1) of the Act, 21 U.S.C.section 360bbb-3(b)(1), unless the authorization is terminated  or revoked sooner.       Influenza A by PCR NEGATIVE NEGATIVE Final   Influenza B by PCR NEGATIVE NEGATIVE Final     Comment: (NOTE) The Xpert Xpress SARS-CoV-2/FLU/RSV plus assay is intended as an aid in the diagnosis of influenza from Nasopharyngeal swab specimens and should not be used as a sole basis for treatment. Nasal washings and aspirates are unacceptable for Xpert Xpress SARS-CoV-2/FLU/RSV testing.  Fact Sheet for Patients: EntrepreneurPulse.com.au  Fact Sheet for Healthcare Providers: IncredibleEmployment.be  This test is not yet approved or cleared by the [place] FDA and has been authorized for detection and/or diagnosis of SARS-CoV-2 by FDA under an Emergency Use Authorization (EUA). This EUA will remain in effect (meaning this test can be used) for the duration of the COVID-19 declaration under Section 564(b)(1) of the Act, 21 U.S.C. section 360bbb-3(b)(1), unless the authorization is terminated or revoked.  Performed at [facility], [address] [place], [place] [address]   Resp Panel by RT-PCR (Flu A&B, Covid) Nasopharyngeal Swab     Status: None   Collection Time: 01/10/21  5:30 PM   Specimen: Nasopharyngeal Swab; Nasopharyngeal(NP) swabs in vial transport medium  Result Value Ref Range Status   SARS Coronavirus 2 by RT PCR NEGATIVE NEGATIVE Final    Comment: (NOTE) SARS-CoV-2 target nucleic acids are NOT DETECTED.  The SARS-CoV-2 RNA is generally detectable in upper respiratory specimens during the acute phase of infection. The lowest concentration of SARS-CoV-2 viral copies this assay can detect is 138 copies/mL. A negative result does not preclude SARS-Cov-2 infection and should not be used as the sole basis for treatment or other patient management decisions.  A negative result may occur with  improper specimen collection/handling, submission of specimen other than nasopharyngeal swab, presence of viral mutation(s) within the areas targeted by this assay, and inadequate number of viral copies(<138 copies/mL). A  negative result must be combined with clinical observations, patient history, and epidemiological information. The expected result is Negative.  Fact Sheet for Patients:  EntrepreneurPulse.com.au  Fact Sheet for Healthcare Providers:  IncredibleEmployment.be  This test is no t yet approved or cleared by the [place] FDA and  has been authorized for detection and/or diagnosis of SARS-CoV-2 by FDA under an Emergency Use Authorization (EUA). This EUA will remain  in effect (meaning this test can be used) for the duration of the COVID-19 declaration under Section 564(b)(1) of the Act, 21 U.S.C.section 360bbb-3(b)(1), unless the authorization is terminated  or revoked sooner.       Influenza A by PCR NEGATIVE NEGATIVE Final   Influenza B by PCR NEGATIVE NEGATIVE Final    Comment: (NOTE) The Xpert Xpress SARS-CoV-2/FLU/RSV plus assay is intended as an aid in the diagnosis of influenza from Nasopharyngeal swab specimens and should not be used as a sole basis for treatment. Nasal washings and aspirates are unacceptable for Xpert Xpress SARS-CoV-2/FLU/RSV testing.  Fact Sheet for Patients: EntrepreneurPulse.com.au  Fact Sheet for Healthcare Providers: IncredibleEmployment.be  This test is not yet approved or cleared by the [place] FDA and has been authorized for detection and/or diagnosis of SARS-CoV-2 by FDA under an Emergency Use Authorization (EUA). This EUA will remain in effect (meaning this test can be used) for the duration of the COVID-19 declaration under Section 564(b)(1) of the Act, 21 U.S.C. section 360bbb-3(b)(1), unless the authorization is terminated or revoked.  Performed at [facility], [address], [place], [place] [address]      Time coordinating discharge: Over 30 minutes  SIGNED:   Nolberto Hanlon, MD  [facility] 01/11/2021, 10:38 AM Pager   If  7PM-7AM, please contact night-coverage [email or web address] Password [email or web address]

## 2021-01-11 ENCOUNTER — Ambulatory Visit: Admission: RE | Admit: 2021-01-11 | Payer: Medicare Other | Source: Ambulatory Visit

## 2021-01-11 DIAGNOSIS — J90 Pleural effusion, not elsewhere classified: Secondary | ICD-10-CM | POA: Diagnosis not present

## 2021-01-11 DIAGNOSIS — C50912 Malignant neoplasm of unspecified site of left female breast: Secondary | ICD-10-CM | POA: Diagnosis not present

## 2021-01-11 DIAGNOSIS — I959 Hypotension, unspecified: Secondary | ICD-10-CM | POA: Diagnosis not present

## 2021-01-11 DIAGNOSIS — J9611 Chronic respiratory failure with hypoxia: Secondary | ICD-10-CM | POA: Diagnosis present

## 2021-01-11 DIAGNOSIS — R5381 Other malaise: Secondary | ICD-10-CM | POA: Diagnosis not present

## 2021-01-11 DIAGNOSIS — E871 Hypo-osmolality and hyponatremia: Secondary | ICD-10-CM | POA: Diagnosis present

## 2021-01-11 DIAGNOSIS — R600 Localized edema: Secondary | ICD-10-CM | POA: Diagnosis not present

## 2021-01-11 DIAGNOSIS — J811 Chronic pulmonary edema: Secondary | ICD-10-CM | POA: Diagnosis not present

## 2021-01-11 DIAGNOSIS — R609 Edema, unspecified: Secondary | ICD-10-CM | POA: Diagnosis not present

## 2021-01-11 DIAGNOSIS — J302 Other seasonal allergic rhinitis: Secondary | ICD-10-CM | POA: Diagnosis not present

## 2021-01-11 DIAGNOSIS — I824Y2 Acute embolism and thrombosis of unspecified deep veins of left proximal lower extremity: Secondary | ICD-10-CM | POA: Diagnosis not present

## 2021-01-11 DIAGNOSIS — Z8719 Personal history of other diseases of the digestive system: Secondary | ICD-10-CM | POA: Diagnosis not present

## 2021-01-11 DIAGNOSIS — J9601 Acute respiratory failure with hypoxia: Secondary | ICD-10-CM | POA: Diagnosis not present

## 2021-01-11 DIAGNOSIS — R0689 Other abnormalities of breathing: Secondary | ICD-10-CM | POA: Diagnosis not present

## 2021-01-11 DIAGNOSIS — C773 Secondary and unspecified malignant neoplasm of axilla and upper limb lymph nodes: Secondary | ICD-10-CM | POA: Diagnosis not present

## 2021-01-11 DIAGNOSIS — R338 Other retention of urine: Secondary | ICD-10-CM | POA: Diagnosis not present

## 2021-01-11 DIAGNOSIS — R0781 Pleurodynia: Secondary | ICD-10-CM | POA: Diagnosis not present

## 2021-01-11 DIAGNOSIS — C3490 Malignant neoplasm of unspecified part of unspecified bronchus or lung: Secondary | ICD-10-CM | POA: Diagnosis not present

## 2021-01-11 DIAGNOSIS — M4854XA Collapsed vertebra, not elsewhere classified, thoracic region, initial encounter for fracture: Secondary | ICD-10-CM | POA: Diagnosis not present

## 2021-01-11 DIAGNOSIS — Z66 Do not resuscitate: Secondary | ICD-10-CM | POA: Diagnosis present

## 2021-01-11 DIAGNOSIS — F329 Major depressive disorder, single episode, unspecified: Secondary | ICD-10-CM | POA: Diagnosis not present

## 2021-01-11 DIAGNOSIS — F39 Unspecified mood [affective] disorder: Secondary | ICD-10-CM | POA: Diagnosis not present

## 2021-01-11 DIAGNOSIS — Z170 Estrogen receptor positive status [ER+]: Secondary | ICD-10-CM | POA: Diagnosis not present

## 2021-01-11 DIAGNOSIS — M7989 Other specified soft tissue disorders: Secondary | ICD-10-CM | POA: Diagnosis not present

## 2021-01-11 DIAGNOSIS — N179 Acute kidney failure, unspecified: Secondary | ICD-10-CM | POA: Diagnosis not present

## 2021-01-11 DIAGNOSIS — Z743 Need for continuous supervision: Secondary | ICD-10-CM | POA: Diagnosis not present

## 2021-01-11 DIAGNOSIS — N3289 Other specified disorders of bladder: Secondary | ICD-10-CM | POA: Diagnosis not present

## 2021-01-11 DIAGNOSIS — N1831 Chronic kidney disease, stage 3a: Secondary | ICD-10-CM | POA: Diagnosis not present

## 2021-01-11 DIAGNOSIS — Z741 Need for assistance with personal care: Secondary | ICD-10-CM | POA: Diagnosis not present

## 2021-01-11 DIAGNOSIS — I82412 Acute embolism and thrombosis of left femoral vein: Secondary | ICD-10-CM | POA: Diagnosis present

## 2021-01-11 DIAGNOSIS — R4182 Altered mental status, unspecified: Secondary | ICD-10-CM | POA: Diagnosis not present

## 2021-01-11 DIAGNOSIS — C50812 Malignant neoplasm of overlapping sites of left female breast: Secondary | ICD-10-CM | POA: Diagnosis not present

## 2021-01-11 DIAGNOSIS — R069 Unspecified abnormalities of breathing: Secondary | ICD-10-CM | POA: Diagnosis not present

## 2021-01-11 DIAGNOSIS — Z85118 Personal history of other malignant neoplasm of bronchus and lung: Secondary | ICD-10-CM | POA: Diagnosis not present

## 2021-01-11 DIAGNOSIS — Z853 Personal history of malignant neoplasm of breast: Secondary | ICD-10-CM | POA: Diagnosis not present

## 2021-01-11 DIAGNOSIS — Z79899 Other long term (current) drug therapy: Secondary | ICD-10-CM | POA: Diagnosis not present

## 2021-01-11 DIAGNOSIS — R062 Wheezing: Secondary | ICD-10-CM | POA: Diagnosis not present

## 2021-01-11 DIAGNOSIS — E785 Hyperlipidemia, unspecified: Secondary | ICD-10-CM | POA: Diagnosis present

## 2021-01-11 DIAGNOSIS — R7989 Other specified abnormal findings of blood chemistry: Secondary | ICD-10-CM | POA: Diagnosis present

## 2021-01-11 DIAGNOSIS — D638 Anemia in other chronic diseases classified elsewhere: Secondary | ICD-10-CM | POA: Diagnosis not present

## 2021-01-11 DIAGNOSIS — R9431 Abnormal electrocardiogram [ECG] [EKG]: Secondary | ICD-10-CM | POA: Diagnosis not present

## 2021-01-11 DIAGNOSIS — R0602 Shortness of breath: Secondary | ICD-10-CM | POA: Diagnosis not present

## 2021-01-11 DIAGNOSIS — J309 Allergic rhinitis, unspecified: Secondary | ICD-10-CM | POA: Diagnosis present

## 2021-01-11 DIAGNOSIS — Z9049 Acquired absence of other specified parts of digestive tract: Secondary | ICD-10-CM | POA: Diagnosis not present

## 2021-01-11 DIAGNOSIS — I2699 Other pulmonary embolism without acute cor pulmonale: Secondary | ICD-10-CM | POA: Diagnosis not present

## 2021-01-11 DIAGNOSIS — Z7952 Long term (current) use of systemic steroids: Secondary | ICD-10-CM | POA: Diagnosis not present

## 2021-01-11 DIAGNOSIS — F0390 Unspecified dementia without behavioral disturbance: Secondary | ICD-10-CM | POA: Diagnosis present

## 2021-01-11 DIAGNOSIS — Z833 Family history of diabetes mellitus: Secondary | ICD-10-CM | POA: Diagnosis not present

## 2021-01-11 DIAGNOSIS — R778 Other specified abnormalities of plasma proteins: Secondary | ICD-10-CM | POA: Diagnosis not present

## 2021-01-11 DIAGNOSIS — C50919 Malignant neoplasm of unspecified site of unspecified female breast: Secondary | ICD-10-CM | POA: Diagnosis not present

## 2021-01-11 DIAGNOSIS — M6281 Muscle weakness (generalized): Secondary | ICD-10-CM | POA: Diagnosis not present

## 2021-01-11 DIAGNOSIS — K449 Diaphragmatic hernia without obstruction or gangrene: Secondary | ICD-10-CM | POA: Diagnosis not present

## 2021-01-11 DIAGNOSIS — J984 Other disorders of lung: Secondary | ICD-10-CM | POA: Diagnosis not present

## 2021-01-11 DIAGNOSIS — R059 Cough, unspecified: Secondary | ICD-10-CM | POA: Diagnosis not present

## 2021-01-11 DIAGNOSIS — F419 Anxiety disorder, unspecified: Secondary | ICD-10-CM | POA: Diagnosis not present

## 2021-01-11 DIAGNOSIS — R296 Repeated falls: Secondary | ICD-10-CM | POA: Diagnosis not present

## 2021-01-11 DIAGNOSIS — E039 Hypothyroidism, unspecified: Secondary | ICD-10-CM | POA: Diagnosis not present

## 2021-01-11 DIAGNOSIS — R531 Weakness: Secondary | ICD-10-CM | POA: Diagnosis not present

## 2021-01-11 DIAGNOSIS — F32A Depression, unspecified: Secondary | ICD-10-CM | POA: Diagnosis present

## 2021-01-11 DIAGNOSIS — R911 Solitary pulmonary nodule: Secondary | ICD-10-CM | POA: Diagnosis not present

## 2021-01-11 DIAGNOSIS — M355 Multifocal fibrosclerosis: Secondary | ICD-10-CM | POA: Diagnosis not present

## 2021-01-11 DIAGNOSIS — K5900 Constipation, unspecified: Secondary | ICD-10-CM | POA: Diagnosis not present

## 2021-01-11 DIAGNOSIS — Z79811 Long term (current) use of aromatase inhibitors: Secondary | ICD-10-CM | POA: Diagnosis not present

## 2021-01-11 DIAGNOSIS — Z20822 Contact with and (suspected) exposure to covid-19: Secondary | ICD-10-CM | POA: Diagnosis present

## 2021-01-11 DIAGNOSIS — M8580 Other specified disorders of bone density and structure, unspecified site: Secondary | ICD-10-CM | POA: Diagnosis not present

## 2021-01-11 DIAGNOSIS — N3281 Overactive bladder: Secondary | ICD-10-CM | POA: Diagnosis not present

## 2021-01-11 DIAGNOSIS — I2694 Multiple subsegmental pulmonary emboli without acute cor pulmonale: Secondary | ICD-10-CM | POA: Diagnosis present

## 2021-01-11 DIAGNOSIS — Z882 Allergy status to sulfonamides status: Secondary | ICD-10-CM | POA: Diagnosis not present

## 2021-01-11 DIAGNOSIS — K219 Gastro-esophageal reflux disease without esophagitis: Secondary | ICD-10-CM | POA: Diagnosis present

## 2021-01-11 DIAGNOSIS — R4189 Other symptoms and signs involving cognitive functions and awareness: Secondary | ICD-10-CM | POA: Diagnosis not present

## 2021-01-11 DIAGNOSIS — R278 Other lack of coordination: Secondary | ICD-10-CM | POA: Diagnosis not present

## 2021-01-11 DIAGNOSIS — R0902 Hypoxemia: Secondary | ICD-10-CM | POA: Diagnosis not present

## 2021-01-11 DIAGNOSIS — I82442 Acute embolism and thrombosis of left tibial vein: Secondary | ICD-10-CM | POA: Diagnosis present

## 2021-01-11 DIAGNOSIS — C3412 Malignant neoplasm of upper lobe, left bronchus or lung: Secondary | ICD-10-CM | POA: Diagnosis not present

## 2021-01-11 DIAGNOSIS — R2689 Other abnormalities of gait and mobility: Secondary | ICD-10-CM | POA: Diagnosis not present

## 2021-01-11 DIAGNOSIS — I700 Atherosclerosis of aorta: Secondary | ICD-10-CM | POA: Diagnosis not present

## 2021-01-11 DIAGNOSIS — K2289 Other specified disease of esophagus: Secondary | ICD-10-CM | POA: Diagnosis not present

## 2021-01-11 DIAGNOSIS — E8809 Other disorders of plasma-protein metabolism, not elsewhere classified: Secondary | ICD-10-CM | POA: Diagnosis present

## 2021-01-11 DIAGNOSIS — I6782 Cerebral ischemia: Secondary | ICD-10-CM | POA: Diagnosis not present

## 2021-01-11 DIAGNOSIS — I248 Other forms of acute ischemic heart disease: Secondary | ICD-10-CM | POA: Diagnosis present

## 2021-01-11 DIAGNOSIS — M797 Fibromyalgia: Secondary | ICD-10-CM | POA: Diagnosis present

## 2021-01-11 NOTE — TOC Progression Note (Signed)
Transition of Care [name]) - Progression Note    Patient Details  Name: Jamie Matthews MRN: [id number] Date of Birth: 1940-12-27  Transition [facility]) CM/SW [place], RN Phone Number: 01/11/2021, 11:30 AM  Clinical Narrative:   Patient being transferred to [facility] today.  Seth Bake aware of transfer, will go to room 118 via EMS at noon . Patient and family aware.    Expected Discharge Plan: [place] Barriers to Discharge: Continued Medical Work up  Expected Discharge Plan and Services Expected Discharge Plan: [place] Choice: [place] arrangements for the past 2 months: [place] Expected Discharge Date: 01/10/21                                     Social Determinants of Health (SDOH) Interventions    Readmission Risk Interventions Readmission Risk Prevention Plan 10/10/2018  Post Dischage Appt Complete  Medication Screening Complete  Transportation Screening Complete  Some recent data might be hidden

## 2021-01-11 NOTE — Care Management Important Message (Signed)
Important Message  Patient Details  Name: Jamie Matthews MRN: [id number] Date of Birth: Sep 25, 1940   Medicare Important Message Given:  N/A - LOS <3 / Initial given by admissions  Initial Medicare IM reviewed with patient by Brantley Fling, Patient Access Associate on 01/10/2021 at 10:55am.   Dannette Barbara 01/11/2021, 8:30 AM

## 2021-01-11 NOTE — Progress Notes (Signed)
Called and gave report to [facility], talked to [place]. Will assist patient in gathering belongings. Bed in low position and call bell in reach.

## 2021-01-11 NOTE — Plan of Care (Addendum)
End of Shift Summary:  Alert and oriented, but with intermittent confusion and irritability. COVID test (-). VSS on 2L via [place]. Denies pain or n/v. Denies shortness of breath. No BM overnight, abd distended, scheduled miralax given. Remained free from falls or injury. Bed low and in locked position. Call bell within reach and able to use.   Problem: Clinical Measurements: Goal: Respiratory complications will improve Outcome: Progressing   Problem: Clinical Measurements: Goal: Cardiovascular complication will be avoided Outcome: Progressing   Problem: Activity: Goal: Risk for activity intolerance will decrease Outcome: Progressing   Problem: Coping: Goal: Level of anxiety will decrease Outcome: Progressing   Problem: Safety: Goal: Ability to remain free from injury will improve Outcome: Progressing   Problem: Skin Integrity: Goal: Risk for impaired skin integrity will decrease Outcome: Progressing

## 2021-01-13 LAB — CULTURE, BLOOD (ROUTINE X 2)
Culture: NO GROWTH
Culture: NO GROWTH
Special Requests: ADEQUATE

## 2021-01-14 DIAGNOSIS — N1831 Chronic kidney disease, stage 3a: Secondary | ICD-10-CM | POA: Diagnosis not present

## 2021-01-14 DIAGNOSIS — C3490 Malignant neoplasm of unspecified part of unspecified bronchus or lung: Secondary | ICD-10-CM | POA: Diagnosis not present

## 2021-01-14 DIAGNOSIS — I700 Atherosclerosis of aorta: Secondary | ICD-10-CM | POA: Diagnosis not present

## 2021-01-14 DIAGNOSIS — C50919 Malignant neoplasm of unspecified site of unspecified female breast: Secondary | ICD-10-CM | POA: Diagnosis not present

## 2021-01-14 DIAGNOSIS — J9601 Acute respiratory failure with hypoxia: Secondary | ICD-10-CM | POA: Diagnosis not present

## 2021-01-14 DIAGNOSIS — F39 Unspecified mood [affective] disorder: Secondary | ICD-10-CM | POA: Diagnosis not present

## 2021-01-16 ENCOUNTER — Inpatient Hospital Stay (HOSPITAL_BASED_OUTPATIENT_CLINIC_OR_DEPARTMENT_OTHER): Payer: Medicare Other | Admitting: Oncology

## 2021-01-16 ENCOUNTER — Other Ambulatory Visit (HOSPITAL_COMMUNITY): Payer: Self-pay

## 2021-01-16 ENCOUNTER — Encounter: Payer: Self-pay | Admitting: Oncology

## 2021-01-16 ENCOUNTER — Inpatient Hospital Stay: Payer: Medicare Other | Attending: Oncology

## 2021-01-16 VITALS — BP 120/63 | HR 57 | Temp 97.6°F | Resp 18 | Wt 138.0 lb

## 2021-01-16 DIAGNOSIS — C3490 Malignant neoplasm of unspecified part of unspecified bronchus or lung: Secondary | ICD-10-CM | POA: Diagnosis not present

## 2021-01-16 DIAGNOSIS — C3412 Malignant neoplasm of upper lobe, left bronchus or lung: Secondary | ICD-10-CM | POA: Insufficient documentation

## 2021-01-16 DIAGNOSIS — Z79899 Other long term (current) drug therapy: Secondary | ICD-10-CM | POA: Insufficient documentation

## 2021-01-16 DIAGNOSIS — J984 Other disorders of lung: Secondary | ICD-10-CM | POA: Insufficient documentation

## 2021-01-16 DIAGNOSIS — M8580 Other specified disorders of bone density and structure, unspecified site: Secondary | ICD-10-CM

## 2021-01-16 DIAGNOSIS — Z9049 Acquired absence of other specified parts of digestive tract: Secondary | ICD-10-CM | POA: Diagnosis not present

## 2021-01-16 DIAGNOSIS — C773 Secondary and unspecified malignant neoplasm of axilla and upper limb lymph nodes: Secondary | ICD-10-CM | POA: Diagnosis not present

## 2021-01-16 DIAGNOSIS — R062 Wheezing: Secondary | ICD-10-CM | POA: Insufficient documentation

## 2021-01-16 DIAGNOSIS — C50812 Malignant neoplasm of overlapping sites of left female breast: Secondary | ICD-10-CM | POA: Diagnosis not present

## 2021-01-16 DIAGNOSIS — I700 Atherosclerosis of aorta: Secondary | ICD-10-CM | POA: Insufficient documentation

## 2021-01-16 DIAGNOSIS — K449 Diaphragmatic hernia without obstruction or gangrene: Secondary | ICD-10-CM | POA: Diagnosis not present

## 2021-01-16 DIAGNOSIS — Z79811 Long term (current) use of aromatase inhibitors: Secondary | ICD-10-CM

## 2021-01-16 DIAGNOSIS — C50912 Malignant neoplasm of unspecified site of left female breast: Secondary | ICD-10-CM | POA: Diagnosis not present

## 2021-01-16 DIAGNOSIS — Z8719 Personal history of other diseases of the digestive system: Secondary | ICD-10-CM | POA: Insufficient documentation

## 2021-01-16 DIAGNOSIS — J811 Chronic pulmonary edema: Secondary | ICD-10-CM | POA: Diagnosis not present

## 2021-01-16 DIAGNOSIS — I6782 Cerebral ischemia: Secondary | ICD-10-CM | POA: Diagnosis not present

## 2021-01-16 DIAGNOSIS — Z803 Family history of malignant neoplasm of breast: Secondary | ICD-10-CM | POA: Insufficient documentation

## 2021-01-16 DIAGNOSIS — Z7952 Long term (current) use of systemic steroids: Secondary | ICD-10-CM | POA: Diagnosis not present

## 2021-01-16 DIAGNOSIS — E785 Hyperlipidemia, unspecified: Secondary | ICD-10-CM | POA: Diagnosis not present

## 2021-01-16 DIAGNOSIS — Z170 Estrogen receptor positive status [ER+]: Secondary | ICD-10-CM | POA: Diagnosis not present

## 2021-01-16 DIAGNOSIS — J90 Pleural effusion, not elsewhere classified: Secondary | ICD-10-CM | POA: Diagnosis not present

## 2021-01-16 DIAGNOSIS — R059 Cough, unspecified: Secondary | ICD-10-CM | POA: Insufficient documentation

## 2021-01-16 DIAGNOSIS — Z882 Allergy status to sulfonamides status: Secondary | ICD-10-CM | POA: Insufficient documentation

## 2021-01-16 DIAGNOSIS — M7989 Other specified soft tissue disorders: Secondary | ICD-10-CM | POA: Insufficient documentation

## 2021-01-16 DIAGNOSIS — E871 Hypo-osmolality and hyponatremia: Secondary | ICD-10-CM | POA: Insufficient documentation

## 2021-01-16 DIAGNOSIS — Z833 Family history of diabetes mellitus: Secondary | ICD-10-CM | POA: Insufficient documentation

## 2021-01-16 DIAGNOSIS — K2289 Other specified disease of esophagus: Secondary | ICD-10-CM | POA: Insufficient documentation

## 2021-01-16 DIAGNOSIS — Z8249 Family history of ischemic heart disease and other diseases of the circulatory system: Secondary | ICD-10-CM | POA: Insufficient documentation

## 2021-01-16 DIAGNOSIS — Z5111 Encounter for antineoplastic chemotherapy: Secondary | ICD-10-CM

## 2021-01-16 LAB — COMPREHENSIVE METABOLIC PANEL
ALT: 50 U/L — ABNORMAL HIGH (ref 0–44)
AST: 35 U/L (ref 15–41)
Albumin: 2.8 g/dL — ABNORMAL LOW (ref 3.5–5.0)
Alkaline Phosphatase: 83 U/L (ref 38–126)
Anion gap: 3 — ABNORMAL LOW (ref 5–15)
BUN: 31 mg/dL — ABNORMAL HIGH (ref 8–23)
CO2: 31 mmol/L (ref 22–32)
Calcium: 6.8 mg/dL — ABNORMAL LOW (ref 8.9–10.3)
Chloride: 95 mmol/L — ABNORMAL LOW (ref 98–111)
Creatinine, Ser: 0.93 mg/dL (ref 0.44–1.00)
GFR, Estimated: >60 mL/min (ref >60)
Glucose, Bld: 115 mg/dL — ABNORMAL HIGH (ref 70–99)
Potassium: 4 mmol/L (ref 3.5–5.1)
Sodium: 129 mmol/L — ABNORMAL LOW (ref 135–145)
Total Bilirubin: <0.1 mg/dL — ABNORMAL LOW (ref 0.3–1.2)
Total Protein: 5.3 g/dL — ABNORMAL LOW (ref 6.5–8.1)

## 2021-01-16 LAB — CBC WITH DIFFERENTIAL/PLATELET
Abs Immature Granulocytes: 1.1 10*3/uL — ABNORMAL HIGH (ref 0.00–0.07)
Basophils Absolute: 0 10*3/uL (ref 0.0–0.1)
Basophils Relative: 0 %
Eosinophils Absolute: 0.6 10*3/uL — ABNORMAL HIGH (ref 0.0–0.5)
Eosinophils Relative: 4 %
HCT: 34 % — ABNORMAL LOW (ref 36.0–46.0)
Hemoglobin: 11.1 g/dL — ABNORMAL LOW (ref 12.0–15.0)
Immature Granulocytes: 8 %
Lymphocytes Relative: 15 %
Lymphs Abs: 2.1 10*3/uL (ref 0.7–4.0)
MCH: 30.9 pg (ref 26.0–34.0)
MCHC: 32.6 g/dL (ref 30.0–36.0)
MCV: 94.7 fL (ref 80.0–100.0)
Monocytes Absolute: 2.4 10*3/uL — ABNORMAL HIGH (ref 0.1–1.0)
Monocytes Relative: 17 %
Neutro Abs: 8 10*3/uL — ABNORMAL HIGH (ref 1.7–7.7)
Neutrophils Relative %: 56 %
Platelets: 381 10*3/uL (ref 150–400)
RBC: 3.59 MIL/uL — ABNORMAL LOW (ref 3.87–5.11)
RDW: 14.1 % (ref 11.5–15.5)
Smear Review: NORMAL
WBC: 14.1 10*3/uL — ABNORMAL HIGH (ref 4.0–10.5)
nRBC: 0 % (ref 0.0–0.2)

## 2021-01-16 MED ORDER — CRIZOTINIB 200 MG PO CAPS
200.0000 mg | ORAL_CAPSULE | Freq: Two times a day (BID) | ORAL | 1 refills | Status: DC
  Filled 2021-01-16: qty 60, 30d supply, fill #0

## 2021-01-16 NOTE — Progress Notes (Signed)
Pt here for follow up. No new concerns voiced.   

## 2021-01-16 NOTE — Progress Notes (Signed)
Hematology/Oncology follow up note [facility] [phone] [phone]   Patient Care Team: [name], MD as PCP - General (Family Medicine) Pyrtle, Lajuan Lines, MD as Consulting Physician (Gastroenterology) Telford Nab, RN as Registered Nurse Earlie Server, MD as Consulting Physician (Hematology and Oncology) Debbora Dus, [facility] as Pharmacist (Pharmacist) Burnice Logan, [facility] as Pharmacist (Pharmacist) Ralene Bathe, MD (Dermatology) Festus Aloe, MD as Consulting Physician (Urology)   REASON FOR VISIT:  Follow up for management of lung cancer and breast cancer  HISTORY OF PRESENTING ILLNESS:  Jamie Matthews is a  80 y.o.  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 05/05/2018 bilateral diagnostic breast mammogram showed suspicious mass 1.1cm  in the 12:00 retroareolar region of the left breast and the left axillary lymph node. 06/08/2018 patient status post a left breast retroareolar and left axillary lymph node biopsy. Pathology showed invasive mammary carcinoma, no special type, grade 1, left axillary lymph node positive for invasive mammary carcinoma clinically metastatic.  Background lymph node architecture is not identified. ER> 90% PR> 90%, HER-2 negative.  PET scan done which unfortunately showed additional hypermetabolic bilateral hilar lymph nodes as well as left upper lobe lung mass which may represent a focus of metastatic disease from breast, or primary lung neoplasm.  There is multiple additional small pulmonary nodules are scattered throughout both lungs which are worrisome for metastasis.  Index nodule within the medial right upper lobe measures 7 mm,, peri-broncho-vascular nodule in the right lower lobe measures 1.2 cm.  # Stage IV lung cancer-  Biopsy of lung mass left upper lobe showed non-small cell lung cancer, favor adenocarcinoma.  #NGS came back patient has PD L1  is 70% TPS, MET fusion mutation.  #Mid-March 2020 started on crizotinib  Bilateral lower extremity edema, right >left, Ultrasound venous right 09/20/2018 no DVT.  2D echo 10/10/2018 showed LVEF 55 to 60% Most likely secondary to crizotinib side effects.  # 12/15/2018 CT chest images were independently reviewed and discussed with patient. Dominant left upper lobe nodule and multiple scattered irregular pulmonary parenchymal nodular lesions are stable. Mild basilar pulmonary parenchymal septal thickening is new and can be seen with pulmonary edema. Patient reports no shortness of breath asymptomatic  #12/29/2018 unilateral left diagnostic mammogram with ultrasound images were independently reviewed and discussed with patient. Slight interval reduction of the size of the left retroareolar breast mass, 9 x 6 x 7 mm, comparing to 10 x 9 x 8 mm. Axillary lymph node measured 1.3 x 1.0 x 1.0 cm, previously 2.2 x 1.4 x 1.5 cm,  there were 2 other abdominal lymph nodes in the left axilla, which were not reported in previous ultrasound. Discussed with patient that overall, breast cancer responded to endocrine treatments.  Additional 2 lymph nodes need to be closely monitored.  Recommend patient to continue Arimidex.   INTERVAL HISTORY Jamie Matthews is a 80 y.o. female who has above history reviewed by me today presents fornon-small cell lung cancer and breast cancer management.  01/08/2021 - 01/11/2021, patient was admitted to the hospital due to hypoxia, acute respiratory failure.  COVID/influenza were negative.  01/08/2021, CT angio chest PE protocol showed no PE new small bilateral pleural effusion.  New moderate hiatal hernia and a diffuse distended esophagus with air-fluid level.  Suggestive of gastroesophageal reflux.  Unchanged 2.3 cm faintly calcified solid pulmonary nodule in the apical left upper lobe. It was felt that the acute  respiratory failure with hypoxia is probably due to upper  respiratory infection. Patient was treated with IV steroid, and discharged on a tapering course of steroids. Procalcitonin is less than 0.1. Crizotinib was held during the admission.  Today patient presents for post hospitalization follow-up.  She reports feeling better.  Denies shortness of breath.  She has wheezing.   Review of Systems  Constitutional:  Negative for appetite change, chills, fatigue and fever.  HENT:   Negative for hearing loss and voice change.   Eyes:  Negative for eye problems.  Respiratory:  Positive for wheezing. Negative for chest tightness and cough.   Cardiovascular:  Positive for leg swelling. Negative for chest pain.  Gastrointestinal:  Negative for abdominal distention, abdominal pain, blood in stool, constipation and nausea.  Endocrine: Negative for hot flashes.  Genitourinary:  Negative for difficulty urinating and frequency.   Musculoskeletal:  Negative for arthralgias.  Skin:  Negative for itching and rash.  Neurological:  Negative for extremity weakness and light-headedness.  Hematological:  Negative for adenopathy.  Psychiatric/Behavioral:  Negative for confusion and sleep disturbance.    MEDICAL HISTORY:  Past Medical History:  Diagnosis Date   AKI (acute kidney injury) ([place]) 08/17/2018   Anxiety    Arthritis    Belching    Bladder disorder    OVERACTIVE   Bowel dysfunction    BLOCKAGE   Cancer (HCC)    breast   Constipation    Depression    Diverticulitis    Fibromyalgia    GERD (gastroesophageal reflux disease)    Hyperlipidemia    IBS (irritable bowel syndrome)    Internal hemorrhoids    Lung cancer ([place]) 2020   Memory deficits    Murmur    asymptomatic   Pneumonia 11/18/12   Urinary incontinence    Vertigo     SURGICAL HISTORY: Past Surgical History:  Procedure Laterality Date   AXILLARY LYMPH NODE BIOPSY Left 06/08/2018   INVASIVE MAMMARY CARCINOMA   BLADDER SUSPENSION  2004, 2012   BREAST BIOPSY Left 06/08/2018    INVASIVE MAMMARY CARCINOMA   CATARACT EXTRACTION W/PHACO Right 08/27/2015   Procedure: CATARACT EXTRACTION PHACO AND INTRAOCULAR LENS PLACEMENT ([place]);  Surgeon: Estill Cotta, MD;  Location: [facility] ORS;  Service: Ophthalmology;  Laterality: Right;  [place]   1:00.2 AP%  22.5 CDE  23.67 fluid casette lot #[id number]  exp05/31/2018   CATARACT EXTRACTION W/PHACO Left 10/15/2015   Procedure: CATARACT EXTRACTION PHACO AND INTRAOCULAR LENS PLACEMENT ([facility]);  Surgeon: Estill Cotta, MD;  Location: [facility] ORS;  Service: Ophthalmology;  Laterality: Left;  [place] 01:07 AP% 18.1 CDE 21.57 fluid pack lot # [id number]   CHOLECYSTECTOMY     COLONOSCOPY  2017   ELECTROMAGNETIC NAVIGATION BROCHOSCOPY N/A 07/09/2018   Procedure: ELECTROMAGNETIC NAVIGATION BRONCHOSCOPY;  Surgeon: Flora Lipps, MD;  Location: [facility] ORS;  Service: Cardiopulmonary;  Laterality: N/A;   TONSILLECTOMY  1947    SOCIAL HISTORY: Social History   Socioeconomic History   Marital status: Married    Spouse name: Scientist, forensic   Number of children: 0   Years of education: Master's Degree   Highest education level: Not on file  Occupational History   Occupation: Retired  Tobacco Use   Smoking status: Never   Smokeless tobacco: Never  Vaping Use   Vaping Use: Never used  Substance and Sexual Activity   Alcohol use: Not Currently    Comment: rare wine   Drug use: No   Sexual activity: Not Currently  Other Topics Concern  Not on file  Social History Narrative   Recently moved with her husband to [place] from [place].   Husband is a retired Pharmacist, community.   No children.   She is a retired Pharmacist, hospital.   Enjoys: Chief Technology Officer, reading - mysteries and biographies, cooking   Exercise: walking, gardening   Diet: low appetite due to cancer treatment, grazing   She is a DNR.   Social Determinants of Health   Financial Resource Strain: Not on file  Food Insecurity: Not on file  Transportation Needs: Not on file  Physical Activity: Not on file   Stress: Not on file  Social Connections: Not on file  Intimate Partner Violence: Not on file    FAMILY HISTORY: Family History  Problem Relation Age of Onset   Diabetes Father    Heart disease Father    Lymphoma Father    Heart disease Mother    Breast cancer Sister 74   Colon cancer Neg Hx    Esophageal cancer Neg Hx    Rectal cancer Neg Hx    Stomach cancer Neg Hx    Bladder Cancer Neg Hx    Kidney cancer Neg Hx     ALLERGIES:  is allergic to sulfa antibiotics.  MEDICATIONS:  Current Outpatient Medications  Medication Sig Dispense Refill   acetaminophen (TYLENOL) 325 MG tablet Take 325 mg by mouth every 6 (six) hours as needed for mild pain.      albuterol (VENTOLIN HFA) 108 (90 Base) MCG/ACT inhaler Inhale 2 puffs into the lungs every 6 (six) hours as needed for wheezing or shortness of breath. 8 g 2   alum & mag hydroxide-simeth (MAALOX/MYLANTA) 200-200-20 MG/5ML suspension Take 30 mLs by mouth every 6 (six) hours as needed for indigestion or heartburn. 355 mL 0   anastrozole (ARIMIDEX) 1 MG tablet Take 1 tablet (1 mg total) by mouth daily. 90 tablet 1   benzonatate (TESSALON) 100 MG capsule TAKE 1 CAPSULE BY MOUTH 3 TIMES DAILY ASNEEDED FOR COUGH. 30 capsule 0   crizotinib (XALKORI) 200 MG capsule Take 1 capsule (200 mg total) by mouth 2 (two) times daily. 60 capsule 1   Dextromethorphan-guaiFENesin (MUCINEX DM) 30-600 MG TB12 Take 1 tablet by mouth 2 (two) times daily as needed (for congestion/cough).     fluticasone (FLONASE) 50 MCG/ACT nasal spray Place 2 sprays into both nostrils daily. 16 g 6   levothyroxine (SYNTHROID) 25 MCG tablet TAKE 1 TABLET BY MOUTH DAILY 90 tablet 3   lubiprostone (AMITIZA) 24 MCG capsule TAKE ONE CAPSULE TWICE A DAY WITH MEALS 60 capsule 3   mirabegron ER (MYRBETRIQ) 50 MG TB24 tablet Take 1 tablet (50 mg total) by mouth daily. For overactive bladder. 90 tablet 0   Multiple Vitamin (MULTIVITAMIN WITH MINERALS) TABS tablet Take 1 tablet by  mouth daily.     omeprazole (PRILOSEC) 40 MG capsule Take 40 mg by mouth daily.     ondansetron (ZOFRAN) 8 MG tablet TAKE ONE TABLET EVERY EIGHT HOURS AS NEEDED FOR NAUSEA / VOMITING 60 tablet 1   polyethylene glycol (MIRALAX / GLYCOLAX) 17 g packet Take 17 g by mouth 2 (two) times daily. 14 each 0   venlafaxine XR (EFFEXOR-XR) 150 MG 24 hr capsule TAKE 1 CAPSULE BY MOUTH EVERY DAY 90 capsule 1   Vitamin D, Ergocalciferol, (DRISDOL) 1.25 MG (50000 UNIT) CAPS capsule Take 50,000 Units by mouth every Sunday at 6pm.     atorvastatin (LIPITOR) 20 MG tablet TAKE 1 TABLET BY MOUTH  NIGHTLY (Patient not taking: Reported on 01/16/2021) 90 tablet 0   buPROPion (WELLBUTRIN XL) 150 MG 24 hr tablet TAKE ONE TABLET BY MOUTH EVERY DAY (Patient not taking: Reported on 01/16/2021) 90 tablet 2   Calcium-Magnesium-Vitamin D (CALCIUM 1200+D3 PO) Take 1 tablet by mouth daily. (Patient not taking: No sig reported)     docusate sodium (COLACE) 100 MG capsule TAKE 1 CAPSULE BY MOUTH DAILY (Patient not taking: Reported on 01/16/2021) 90 capsule 0   memantine (NAMENDA) 10 MG tablet Take 1 tablet (10 mg total) by mouth 2 (two) times daily. (Patient not taking: Reported on 01/16/2021) 180 tablet 2   predniSONE (DELTASONE) 20 MG tablet Take 0.5 tablets (10 mg total) by mouth daily. Take 40 mg starting on 8/19 x2 days, then 22m po daily x2 days then stop (Patient not taking: Reported on 01/16/2021)     No current facility-administered medications for this visit.     PHYSICAL EXAMINATION: ECOG PERFORMANCE STATUS: 2 - Symptomatic, <50% confined to bed Vitals:   01/16/21 1400  BP: 120/63  Pulse: (!) 57  Resp: 18  Temp: 97.6 F (36.4 C)   Filed Weights   01/16/21 1400  Weight: 138 lb (62.6 kg)    Physical Exam Constitutional:      General: She is not in acute distress.    Appearance: She is not diaphoretic.     Comments: Frail female, sits in the wheelchair.  HENT:     Head: Normocephalic and atraumatic.     Nose:  Nose normal.     Mouth/Throat:     Pharynx: No oropharyngeal exudate.  Eyes:     General: No scleral icterus.    Pupils: Pupils are equal, round, and reactive to light.  Cardiovascular:     Rate and Rhythm: Normal rate and regular rhythm.     Heart sounds: No murmur heard. Pulmonary:     Effort: Pulmonary effort is normal. No respiratory distress.     Breath sounds: No wheezing or rales.  Chest:     Chest wall: No tenderness.  Abdominal:     General: There is no distension.     Palpations: Abdomen is soft.     Tenderness: There is no abdominal tenderness.  Musculoskeletal:        General: Normal range of motion.     Cervical back: Normal range of motion and neck supple.     Comments: Bilateral lower extremity 1+ edema  Skin:    General: Skin is warm and dry.     Findings: No erythema.  Neurological:     Mental Status: She is alert and oriented to person, place, and time.     Cranial Nerves: No cranial nerve deficit.     Motor: No abnormal muscle tone.     Coordination: Coordination normal.  Psychiatric:        Mood and Affect: Affect normal.     CMP Latest Ref Rng & Units 01/16/2021  Glucose 70 - 99 mg/dL 115(H)  BUN 8 - 23 mg/dL 31(H)  Creatinine 0.44 - 1.00 mg/dL 0.93  Sodium 135 - 145 mmol/L 129(L)  Potassium 3.5 - 5.1 mmol/L 4.0  Chloride 98 - 111 mmol/L 95(L)  CO2 22 - 32 mmol/L 31  Calcium 8.9 - 10.3 mg/dL 6.8(L)  Total Protein 6.5 - 8.1 g/dL 5.3(L)  Total Bilirubin 0.3 - 1.2 mg/dL <0.1(L)  Alkaline Phos 38 - 126 U/L 83  AST 15 - 41 U/L 35  ALT 0 - 44 U/L  50(H)   CBC Latest Ref Rng & Units 01/16/2021  WBC 4.0 - 10.5 K/uL 14.1(H)  Hemoglobin 12.0 - 15.0 g/dL 11.1(L)  Hematocrit 36.0 - 46.0 % 34.0(L)  Platelets 150 - 400 K/uL 381    LABORATORY DATA:  I have reviewed the data as listed Lab Results  Component Value Date   WBC 14.1 (H) 01/16/2021   HGB 11.1 (L) 01/16/2021   HCT 34.0 (L) 01/16/2021   MCV 94.7 01/16/2021   PLT 381 01/16/2021   Recent  Labs    01/31/20 0917 04/04/20 1243 11/27/20 1442 12/28/20 1445 01/08/21 0837 01/09/21 0507 01/16/21 1342  NA 140   < > 138   < > 133* 135 129*  K 4.1   < > 3.7   < > 4.1 4.5 4.0  CL 107   < > 102   < > 98 101 95*  CO2 26   < > 29   < > _0 GLUCOSE 102*   < > 112*   < > 139* 164* 115*  BUN 23   < > 23   < > 29* 26* 31*  CREATININE 1.13*   < > 1.21*   < > 1.23* 1.14* 0.93  CALCIUM 7.5*   < > 8.8*   < > 7.6* 7.4* 6.8*  GFRNONAA 46*   < > 45*   < > 44* 48* >60  GFRAA 53*  --   --   --   --   --   --   PROT 5.8*   < > 6.6  --  5.3*  --  5.3*  ALBUMIN 3.3*   < > 3.7  --  2.8*  --  2.8*  AST 25   < > 18  --  40  --  35  ALT 25   < > 12  --  38  --  50*  ALKPHOS 82   < > 81  --  70  --  83  BILITOT 0.7   < > 0.7  --  0.7  --  <0.1*   < > = values in this interval not displayed.    Iron/TIBC/Ferritin/ %Sat No results found for: IRON, TIBC, FERRITIN, IRONPCTSAT   RADIOGRAPHIC STUDIES: I have personally reviewed the radiological images as listed and agreed with the findings in the report. DG Chest 2 View  Result Date: 12/28/2020 CLINICAL DATA:  Cough, wheezing and shortness of breath 1 week. EXAM: CHEST - 2 VIEW COMPARISON:  Chest CT 09/21/2020 FINDINGS: The cardiac silhouette, mediastinal and hilar contours are within normal limits and stable. Stable left upper lobe pulmonary lesion. Stable left upper lobe calcified granulomas. Stable mild eventration of the right hemidiaphragm. Mild streaky bibasilar atelectasis but no definite infiltrates or edema. Suspect small bilateral pleural effusions on the lateral film. Remote healed rib fractures are noted. IMPRESSION: 1. Stable left upper lobe pulmonary lesion. 2. Suspect small bilateral pleural effusions and bibasilar atelectasis. Electronically Signed   By: Marijo Sanes M.D.   On: 12/28/2020 15:35   CT Angio Chest PE W and/or Wo Contrast  Result Date: 01/08/2021 CLINICAL DATA:  Cough, congestion, shortness of breath. History of lung  cancer and breast cancer. EXAM: CT ANGIOGRAPHY CHEST WITH CONTRAST TECHNIQUE: Multidetector CT imaging of the chest was performed using the standard protocol during bolus administration of intravenous contrast. Multiplanar CT image reconstructions and MIPs were obtained to evaluate the vascular anatomy. CONTRAST:  35m OMNIPAQUE IOHEXOL 350 MG/ML SOLN COMPARISON:  CT chest dated September 21, 2020. FINDINGS: Cardiovascular: Satisfactory opacification of the pulmonary arteries to the segmental level. No evidence of pulmonary embolism. Normal heart size. No pericardial effusion. No thoracic aortic aneurysm or dissection. Mild atherosclerotic calcification of the aortic arch. Mediastinum/Nodes: No enlarged mediastinal, hilar, or axillary lymph nodes. Thyroid gland, and trachea demonstrate no significant findings. New moderate hiatal hernia and diffusely distended esophagus with air-fluid level. Lungs/Pleura: New small left greater than right pleural effusions. No consolidation or pneumothorax. Mild subsegmental atelectasis in the left lower lobe. Unchanged faintly calcified 2.3 x 2.0 cm nodule in the apical left upper lobe (series 7, image 20). Unchanged 5 mm nodule in the right middle lobe (series 7, image 39). Upper Abdomen: No acute abnormality. Large amount of stool in the visualized colon. Musculoskeletal: No chest wall abnormality. No acute or significant osseous findings. Chronic T9 and T11 compression deformities again noted. Multiple old right-sided rib fractures again noted. Review of the MIP images confirms the above findings. IMPRESSION: 1. No evidence of pulmonary embolism. 2. New small bilateral pleural effusions. 3. New moderate hiatal hernia and diffusely distended esophagus with air-fluid level, suggestive of gastroesophageal reflux. 4. Unchanged 2.3 cm faintly calcified solid pulmonary nodule in the apical left upper lobe. 5. Aortic Atherosclerosis (ICD10-I70.0). Electronically Signed   By: Titus Dubin M.D.   On: 01/08/2021 12:50   MR Brain W Wo Contrast  Result Date: 10/24/2020 CLINICAL DATA:  History of lung and breast cancer.  Follow-up. EXAM: MRI HEAD WITHOUT AND WITH CONTRAST TECHNIQUE: Multiplanar, multiecho pulse sequences of the brain and surrounding structures were obtained without and with intravenous contrast. CONTRAST:  39m GADAVIST GADOBUTROL 1 MMOL/ML IV SOLN COMPARISON:  05/22/2020 FINDINGS: The study is mildly motion degraded. Brain: There is no evidence of an acute infarct, intracranial hemorrhage, mass, midline shift, or extra-axial fluid collection. T2 hyperintensities in the cerebral white matter and pons are unchanged and nonspecific but compatible with mild chronic small vessel ischemic disease. There is moderate cerebral atrophy. No abnormal enhancement is identified. Vascular: Major intracranial vascular flow voids are preserved. Skull and upper cervical spine: Unremarkable bone marrow signal. Sinuses/Orbits: Bilateral cataract extraction. Minimal mucosal thickening in the paranasal sinuses. Clear mastoid air cells. Other: None. IMPRESSION: 1. No evidence of intracranial metastases. 2. Mild chronic small vessel ischemic disease and moderate cerebral atrophy. Electronically Signed   By: ALogan BoresM.D.   On: 10/24/2020 15:57   DG Chest Portable 1 View  Result Date: 01/08/2021 CLINICAL DATA:  Shortness of breath and cough EXAM: PORTABLE CHEST 1 VIEW COMPARISON:  Eleven days ago FINDINGS: Low volume chest. Lucency under the right diaphragm has the appearance of colon. There is no edema, consolidation, effusion, or pneumothorax. Retrocardiac density is from tortuous aorta. Chronic nodular density at the left apex. IMPRESSION: Stable low volume chest.  No focal pneumonia. Electronically Signed   By: JMonte FantasiaM.D.   On: 01/08/2021 11:25       ASSESSMENT & PLAN:  1. Primary adenocarcinoma of lung, unspecified laterality ([place]   2. Encounter for antineoplastic  chemotherapy   3. Malignant neoplasm of left female breast, unspecified estrogen receptor status, unspecified site of breast ([place]   4. Aromatase inhibitor use   5. Osteopenia, unspecified location   Cancer Staging Malignant neoplasm of left female breast ([facility] Staging form: Breast, AJCC 8th Edition - Clinical stage from 12/17/2018: Stage IB (cT1b, cN1, cM0, G1, ER+, PR+, HER2-) - Signed by YEarlie Server MD on 12/17/2018  Primary adenocarcinoma  of lung [facility]) Staging form: Lung, AJCC 8th Edition - Clinical stage from 09/28/2018: Stage IVA (cT2, cN3, cM1a) - Signed by Earlie Server, MD on 09/28/2018   Patient has 2 primaries.   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 Resume Crizotinib 216m BID.  CT scan results did not show any disease progression  Breast cancer, continue Arimidex.  Wheezing, recent history of acute respiratory failure.  Recommend patient to utilize albuterol inhaler as needed #hypocalcemia, Calcium level 8.8.   Continue calcium supplementation.   Hyponatremia, recommend patient to improve oral hydration.  Liberate salt intake.  Follow-up in 2 weeks, repeat BMP and see nurse practitioner +/- IV fluid. Follow-up in 4 weeks, repeat CBC, CMP, see MD.  All questions were answered. The patient knows to call the clinic with any problems questions or concerns.  ZEarlie Server MD, PhD 01/16/2021

## 2021-01-17 ENCOUNTER — Other Ambulatory Visit (HOSPITAL_COMMUNITY): Payer: Self-pay

## 2021-01-17 ENCOUNTER — Telehealth: Payer: Self-pay | Admitting: *Deleted

## 2021-01-17 MED ORDER — CRIZOTINIB 250 MG PO CAPS
250.0000 mg | ORAL_CAPSULE | Freq: Two times a day (BID) | ORAL | 1 refills | Status: DC
  Filled 2021-01-17 (×2): qty 60, 30d supply, fill #0
  Filled 2021-02-07: qty 60, 30d supply, fill #1

## 2021-01-17 NOTE — Telephone Encounter (Signed)
[facility] needs dose clarification for crizotinib. AVS says 200 mg written notes says 250mg .

## 2021-01-17 NOTE — Telephone Encounter (Signed)
error 

## 2021-01-17 NOTE — Addendum Note (Signed)
Addended by: Earlie Server on: 01/17/2021 09:50 AM   Modules accepted: Orders

## 2021-01-17 NOTE — Telephone Encounter (Signed)
Correct dosage: 250 mg BID. Contacted [facility] to notify them of updated dosage. Also contacted [facility] and spoke to [place], and informed her of this as well. Gave her [facility] contact number so she can reach out to them and give them the address for medication shipment.

## 2021-01-18 ENCOUNTER — Other Ambulatory Visit (HOSPITAL_COMMUNITY): Payer: Self-pay

## 2021-01-21 DIAGNOSIS — R609 Edema, unspecified: Secondary | ICD-10-CM | POA: Diagnosis not present

## 2021-01-24 DIAGNOSIS — K219 Gastro-esophageal reflux disease without esophagitis: Secondary | ICD-10-CM | POA: Diagnosis present

## 2021-01-24 DIAGNOSIS — I82412 Acute embolism and thrombosis of left femoral vein: Secondary | ICD-10-CM | POA: Diagnosis present

## 2021-01-24 DIAGNOSIS — I248 Other forms of acute ischemic heart disease: Secondary | ICD-10-CM | POA: Diagnosis present

## 2021-01-24 DIAGNOSIS — Z85118 Personal history of other malignant neoplasm of bronchus and lung: Secondary | ICD-10-CM | POA: Diagnosis not present

## 2021-01-24 DIAGNOSIS — R0689 Other abnormalities of breathing: Secondary | ICD-10-CM | POA: Diagnosis not present

## 2021-01-24 DIAGNOSIS — F32A Depression, unspecified: Secondary | ICD-10-CM | POA: Diagnosis present

## 2021-01-24 DIAGNOSIS — N179 Acute kidney failure, unspecified: Secondary | ICD-10-CM | POA: Diagnosis present

## 2021-01-24 DIAGNOSIS — Z9049 Acquired absence of other specified parts of digestive tract: Secondary | ICD-10-CM | POA: Diagnosis not present

## 2021-01-24 DIAGNOSIS — K449 Diaphragmatic hernia without obstruction or gangrene: Secondary | ICD-10-CM | POA: Diagnosis not present

## 2021-01-24 DIAGNOSIS — J9611 Chronic respiratory failure with hypoxia: Secondary | ICD-10-CM | POA: Diagnosis present

## 2021-01-24 DIAGNOSIS — R338 Other retention of urine: Secondary | ICD-10-CM | POA: Diagnosis not present

## 2021-01-24 DIAGNOSIS — E785 Hyperlipidemia, unspecified: Secondary | ICD-10-CM | POA: Diagnosis present

## 2021-01-24 DIAGNOSIS — R0602 Shortness of breath: Secondary | ICD-10-CM | POA: Diagnosis present

## 2021-01-24 DIAGNOSIS — N3281 Overactive bladder: Secondary | ICD-10-CM | POA: Diagnosis not present

## 2021-01-24 DIAGNOSIS — J90 Pleural effusion, not elsewhere classified: Secondary | ICD-10-CM | POA: Diagnosis not present

## 2021-01-24 DIAGNOSIS — R0781 Pleurodynia: Secondary | ICD-10-CM | POA: Diagnosis not present

## 2021-01-24 DIAGNOSIS — J9601 Acute respiratory failure with hypoxia: Secondary | ICD-10-CM | POA: Diagnosis not present

## 2021-01-24 DIAGNOSIS — I959 Hypotension, unspecified: Secondary | ICD-10-CM | POA: Diagnosis not present

## 2021-01-24 DIAGNOSIS — R9431 Abnormal electrocardiogram [ECG] [EKG]: Secondary | ICD-10-CM | POA: Diagnosis not present

## 2021-01-24 DIAGNOSIS — R600 Localized edema: Secondary | ICD-10-CM | POA: Diagnosis not present

## 2021-01-24 DIAGNOSIS — R911 Solitary pulmonary nodule: Secondary | ICD-10-CM | POA: Diagnosis not present

## 2021-01-24 DIAGNOSIS — E039 Hypothyroidism, unspecified: Secondary | ICD-10-CM | POA: Diagnosis present

## 2021-01-24 DIAGNOSIS — D638 Anemia in other chronic diseases classified elsewhere: Secondary | ICD-10-CM | POA: Diagnosis present

## 2021-01-24 DIAGNOSIS — Z741 Need for assistance with personal care: Secondary | ICD-10-CM | POA: Diagnosis not present

## 2021-01-24 DIAGNOSIS — I82442 Acute embolism and thrombosis of left tibial vein: Secondary | ICD-10-CM | POA: Diagnosis present

## 2021-01-24 DIAGNOSIS — I824Y2 Acute embolism and thrombosis of unspecified deep veins of left proximal lower extremity: Secondary | ICD-10-CM | POA: Diagnosis not present

## 2021-01-24 DIAGNOSIS — M4854XA Collapsed vertebra, not elsewhere classified, thoracic region, initial encounter for fracture: Secondary | ICD-10-CM | POA: Diagnosis not present

## 2021-01-24 DIAGNOSIS — F419 Anxiety disorder, unspecified: Secondary | ICD-10-CM | POA: Diagnosis not present

## 2021-01-24 DIAGNOSIS — Z853 Personal history of malignant neoplasm of breast: Secondary | ICD-10-CM | POA: Diagnosis not present

## 2021-01-24 DIAGNOSIS — I2694 Multiple subsegmental pulmonary emboli without acute cor pulmonale: Secondary | ICD-10-CM | POA: Diagnosis present

## 2021-01-24 DIAGNOSIS — R296 Repeated falls: Secondary | ICD-10-CM | POA: Diagnosis not present

## 2021-01-24 DIAGNOSIS — R0902 Hypoxemia: Secondary | ICD-10-CM | POA: Diagnosis not present

## 2021-01-24 DIAGNOSIS — K5900 Constipation, unspecified: Secondary | ICD-10-CM | POA: Diagnosis present

## 2021-01-24 DIAGNOSIS — M355 Multifocal fibrosclerosis: Secondary | ICD-10-CM | POA: Diagnosis not present

## 2021-01-24 DIAGNOSIS — C50919 Malignant neoplasm of unspecified site of unspecified female breast: Secondary | ICD-10-CM | POA: Diagnosis not present

## 2021-01-24 DIAGNOSIS — R069 Unspecified abnormalities of breathing: Secondary | ICD-10-CM | POA: Diagnosis not present

## 2021-01-24 DIAGNOSIS — R062 Wheezing: Secondary | ICD-10-CM | POA: Diagnosis not present

## 2021-01-24 DIAGNOSIS — Z20822 Contact with and (suspected) exposure to covid-19: Secondary | ICD-10-CM | POA: Diagnosis present

## 2021-01-24 DIAGNOSIS — J302 Other seasonal allergic rhinitis: Secondary | ICD-10-CM | POA: Diagnosis not present

## 2021-01-24 DIAGNOSIS — E871 Hypo-osmolality and hyponatremia: Secondary | ICD-10-CM | POA: Diagnosis present

## 2021-01-24 DIAGNOSIS — R609 Edema, unspecified: Secondary | ICD-10-CM | POA: Diagnosis not present

## 2021-01-24 DIAGNOSIS — M6281 Muscle weakness (generalized): Secondary | ICD-10-CM | POA: Diagnosis not present

## 2021-01-24 DIAGNOSIS — R778 Other specified abnormalities of plasma proteins: Secondary | ICD-10-CM | POA: Diagnosis not present

## 2021-01-24 DIAGNOSIS — C3490 Malignant neoplasm of unspecified part of unspecified bronchus or lung: Secondary | ICD-10-CM | POA: Diagnosis not present

## 2021-01-24 DIAGNOSIS — R2689 Other abnormalities of gait and mobility: Secondary | ICD-10-CM | POA: Diagnosis not present

## 2021-01-24 DIAGNOSIS — F0390 Unspecified dementia without behavioral disturbance: Secondary | ICD-10-CM | POA: Diagnosis present

## 2021-01-24 DIAGNOSIS — E8809 Other disorders of plasma-protein metabolism, not elsewhere classified: Secondary | ICD-10-CM | POA: Diagnosis present

## 2021-01-24 DIAGNOSIS — R278 Other lack of coordination: Secondary | ICD-10-CM | POA: Diagnosis not present

## 2021-01-24 DIAGNOSIS — Z66 Do not resuscitate: Secondary | ICD-10-CM | POA: Diagnosis present

## 2021-01-24 DIAGNOSIS — R7989 Other specified abnormal findings of blood chemistry: Secondary | ICD-10-CM | POA: Diagnosis present

## 2021-01-24 DIAGNOSIS — N3289 Other specified disorders of bladder: Secondary | ICD-10-CM | POA: Diagnosis not present

## 2021-01-24 DIAGNOSIS — F329 Major depressive disorder, single episode, unspecified: Secondary | ICD-10-CM | POA: Diagnosis not present

## 2021-01-24 DIAGNOSIS — R4182 Altered mental status, unspecified: Secondary | ICD-10-CM | POA: Diagnosis not present

## 2021-01-24 DIAGNOSIS — M797 Fibromyalgia: Secondary | ICD-10-CM | POA: Diagnosis present

## 2021-01-24 DIAGNOSIS — I2699 Other pulmonary embolism without acute cor pulmonale: Secondary | ICD-10-CM | POA: Diagnosis not present

## 2021-01-24 DIAGNOSIS — J309 Allergic rhinitis, unspecified: Secondary | ICD-10-CM | POA: Diagnosis present

## 2021-01-24 DIAGNOSIS — R4189 Other symptoms and signs involving cognitive functions and awareness: Secondary | ICD-10-CM | POA: Diagnosis not present

## 2021-02-04 DIAGNOSIS — C3490 Malignant neoplasm of unspecified part of unspecified bronchus or lung: Secondary | ICD-10-CM | POA: Diagnosis not present

## 2021-02-04 DIAGNOSIS — J9611 Chronic respiratory failure with hypoxia: Secondary | ICD-10-CM | POA: Diagnosis not present

## 2021-02-04 DIAGNOSIS — C50919 Malignant neoplasm of unspecified site of unspecified female breast: Secondary | ICD-10-CM

## 2021-02-04 DIAGNOSIS — M355 Multifocal fibrosclerosis: Secondary | ICD-10-CM | POA: Diagnosis not present

## 2021-02-04 DIAGNOSIS — R609 Edema, unspecified: Secondary | ICD-10-CM | POA: Diagnosis not present

## 2021-02-05 ENCOUNTER — Inpatient Hospital Stay: Payer: Medicare Other | Attending: Oncology | Admitting: Oncology

## 2021-02-05 ENCOUNTER — Inpatient Hospital Stay: Payer: Medicare Other

## 2021-02-05 ENCOUNTER — Other Ambulatory Visit: Payer: Self-pay

## 2021-02-05 VITALS — BP 124/53 | HR 58 | Temp 97.2°F | Resp 19

## 2021-02-05 DIAGNOSIS — J90 Pleural effusion, not elsewhere classified: Secondary | ICD-10-CM | POA: Diagnosis not present

## 2021-02-05 DIAGNOSIS — R278 Other lack of coordination: Secondary | ICD-10-CM | POA: Diagnosis not present

## 2021-02-05 DIAGNOSIS — F0390 Unspecified dementia without behavioral disturbance: Secondary | ICD-10-CM | POA: Diagnosis present

## 2021-02-05 DIAGNOSIS — F32A Depression, unspecified: Secondary | ICD-10-CM | POA: Diagnosis present

## 2021-02-05 DIAGNOSIS — R059 Cough, unspecified: Secondary | ICD-10-CM | POA: Insufficient documentation

## 2021-02-05 DIAGNOSIS — R069 Unspecified abnormalities of breathing: Secondary | ICD-10-CM | POA: Diagnosis not present

## 2021-02-05 DIAGNOSIS — I82442 Acute embolism and thrombosis of left tibial vein: Secondary | ICD-10-CM | POA: Diagnosis present

## 2021-02-05 DIAGNOSIS — R5383 Other fatigue: Secondary | ICD-10-CM | POA: Insufficient documentation

## 2021-02-05 DIAGNOSIS — R609 Edema, unspecified: Secondary | ICD-10-CM | POA: Insufficient documentation

## 2021-02-05 DIAGNOSIS — Z85118 Personal history of other malignant neoplasm of bronchus and lung: Secondary | ICD-10-CM | POA: Diagnosis not present

## 2021-02-05 DIAGNOSIS — C50919 Malignant neoplasm of unspecified site of unspecified female breast: Secondary | ICD-10-CM | POA: Diagnosis not present

## 2021-02-05 DIAGNOSIS — K219 Gastro-esophageal reflux disease without esophagitis: Secondary | ICD-10-CM | POA: Diagnosis present

## 2021-02-05 DIAGNOSIS — C3490 Malignant neoplasm of unspecified part of unspecified bronchus or lung: Secondary | ICD-10-CM

## 2021-02-05 DIAGNOSIS — Z66 Do not resuscitate: Secondary | ICD-10-CM | POA: Diagnosis not present

## 2021-02-05 DIAGNOSIS — E785 Hyperlipidemia, unspecified: Secondary | ICD-10-CM | POA: Diagnosis present

## 2021-02-05 DIAGNOSIS — Z803 Family history of malignant neoplasm of breast: Secondary | ICD-10-CM | POA: Insufficient documentation

## 2021-02-05 DIAGNOSIS — M797 Fibromyalgia: Secondary | ICD-10-CM | POA: Diagnosis present

## 2021-02-05 DIAGNOSIS — Z7901 Long term (current) use of anticoagulants: Secondary | ICD-10-CM | POA: Insufficient documentation

## 2021-02-05 DIAGNOSIS — Z8249 Family history of ischemic heart disease and other diseases of the circulatory system: Secondary | ICD-10-CM | POA: Insufficient documentation

## 2021-02-05 DIAGNOSIS — R0902 Hypoxemia: Secondary | ICD-10-CM | POA: Diagnosis not present

## 2021-02-05 DIAGNOSIS — I824Y2 Acute embolism and thrombosis of unspecified deep veins of left proximal lower extremity: Secondary | ICD-10-CM | POA: Diagnosis not present

## 2021-02-05 DIAGNOSIS — Z8719 Personal history of other diseases of the digestive system: Secondary | ICD-10-CM | POA: Insufficient documentation

## 2021-02-05 DIAGNOSIS — C50812 Malignant neoplasm of overlapping sites of left female breast: Secondary | ICD-10-CM | POA: Insufficient documentation

## 2021-02-05 DIAGNOSIS — Z79811 Long term (current) use of aromatase inhibitors: Secondary | ICD-10-CM | POA: Insufficient documentation

## 2021-02-05 DIAGNOSIS — I6782 Cerebral ischemia: Secondary | ICD-10-CM | POA: Insufficient documentation

## 2021-02-05 DIAGNOSIS — K449 Diaphragmatic hernia without obstruction or gangrene: Secondary | ICD-10-CM | POA: Insufficient documentation

## 2021-02-05 DIAGNOSIS — C3412 Malignant neoplasm of upper lobe, left bronchus or lung: Secondary | ICD-10-CM | POA: Insufficient documentation

## 2021-02-05 DIAGNOSIS — I2694 Multiple subsegmental pulmonary emboli without acute cor pulmonale: Secondary | ICD-10-CM | POA: Diagnosis not present

## 2021-02-05 DIAGNOSIS — I2693 Single subsegmental pulmonary embolism without acute cor pulmonale: Secondary | ICD-10-CM | POA: Insufficient documentation

## 2021-02-05 DIAGNOSIS — I2699 Other pulmonary embolism without acute cor pulmonale: Secondary | ICD-10-CM | POA: Diagnosis not present

## 2021-02-05 DIAGNOSIS — M6281 Muscle weakness (generalized): Secondary | ICD-10-CM | POA: Diagnosis not present

## 2021-02-05 DIAGNOSIS — I82412 Acute embolism and thrombosis of left femoral vein: Secondary | ICD-10-CM | POA: Diagnosis present

## 2021-02-05 DIAGNOSIS — M7989 Other specified soft tissue disorders: Secondary | ICD-10-CM | POA: Insufficient documentation

## 2021-02-05 DIAGNOSIS — R0689 Other abnormalities of breathing: Secondary | ICD-10-CM | POA: Diagnosis not present

## 2021-02-05 DIAGNOSIS — R0602 Shortness of breath: Secondary | ICD-10-CM | POA: Diagnosis not present

## 2021-02-05 DIAGNOSIS — R062 Wheezing: Secondary | ICD-10-CM | POA: Diagnosis not present

## 2021-02-05 DIAGNOSIS — C3492 Malignant neoplasm of unspecified part of left bronchus or lung: Secondary | ICD-10-CM

## 2021-02-05 DIAGNOSIS — R7989 Other specified abnormal findings of blood chemistry: Secondary | ICD-10-CM | POA: Diagnosis present

## 2021-02-05 DIAGNOSIS — Z20822 Contact with and (suspected) exposure to covid-19: Secondary | ICD-10-CM | POA: Diagnosis not present

## 2021-02-05 DIAGNOSIS — I248 Other forms of acute ischemic heart disease: Secondary | ICD-10-CM | POA: Diagnosis present

## 2021-02-05 DIAGNOSIS — R911 Solitary pulmonary nodule: Secondary | ICD-10-CM | POA: Diagnosis not present

## 2021-02-05 DIAGNOSIS — J9611 Chronic respiratory failure with hypoxia: Secondary | ICD-10-CM | POA: Diagnosis present

## 2021-02-05 DIAGNOSIS — R9431 Abnormal electrocardiogram [ECG] [EKG]: Secondary | ICD-10-CM | POA: Diagnosis not present

## 2021-02-05 DIAGNOSIS — E039 Hypothyroidism, unspecified: Secondary | ICD-10-CM | POA: Diagnosis present

## 2021-02-05 DIAGNOSIS — F329 Major depressive disorder, single episode, unspecified: Secondary | ICD-10-CM | POA: Diagnosis not present

## 2021-02-05 DIAGNOSIS — Z9049 Acquired absence of other specified parts of digestive tract: Secondary | ICD-10-CM | POA: Diagnosis not present

## 2021-02-05 DIAGNOSIS — J309 Allergic rhinitis, unspecified: Secondary | ICD-10-CM | POA: Diagnosis present

## 2021-02-05 DIAGNOSIS — M47814 Spondylosis without myelopathy or radiculopathy, thoracic region: Secondary | ICD-10-CM | POA: Insufficient documentation

## 2021-02-05 DIAGNOSIS — N179 Acute kidney failure, unspecified: Secondary | ICD-10-CM | POA: Diagnosis not present

## 2021-02-05 DIAGNOSIS — Z7952 Long term (current) use of systemic steroids: Secondary | ICD-10-CM | POA: Insufficient documentation

## 2021-02-05 DIAGNOSIS — R269 Unspecified abnormalities of gait and mobility: Secondary | ICD-10-CM | POA: Insufficient documentation

## 2021-02-05 DIAGNOSIS — F419 Anxiety disorder, unspecified: Secondary | ICD-10-CM | POA: Diagnosis not present

## 2021-02-05 DIAGNOSIS — R600 Localized edema: Secondary | ICD-10-CM | POA: Insufficient documentation

## 2021-02-05 DIAGNOSIS — C773 Secondary and unspecified malignant neoplasm of axilla and upper limb lymph nodes: Secondary | ICD-10-CM | POA: Insufficient documentation

## 2021-02-05 DIAGNOSIS — J302 Other seasonal allergic rhinitis: Secondary | ICD-10-CM | POA: Diagnosis not present

## 2021-02-05 DIAGNOSIS — Z79899 Other long term (current) drug therapy: Secondary | ICD-10-CM | POA: Insufficient documentation

## 2021-02-05 DIAGNOSIS — E871 Hypo-osmolality and hyponatremia: Secondary | ICD-10-CM | POA: Insufficient documentation

## 2021-02-05 DIAGNOSIS — R296 Repeated falls: Secondary | ICD-10-CM | POA: Diagnosis not present

## 2021-02-05 DIAGNOSIS — N3281 Overactive bladder: Secondary | ICD-10-CM | POA: Diagnosis not present

## 2021-02-05 DIAGNOSIS — Z808 Family history of malignant neoplasm of other organs or systems: Secondary | ICD-10-CM | POA: Insufficient documentation

## 2021-02-05 DIAGNOSIS — K2289 Other specified disease of esophagus: Secondary | ICD-10-CM | POA: Insufficient documentation

## 2021-02-05 DIAGNOSIS — J479 Bronchiectasis, uncomplicated: Secondary | ICD-10-CM | POA: Insufficient documentation

## 2021-02-05 DIAGNOSIS — J9601 Acute respiratory failure with hypoxia: Secondary | ICD-10-CM

## 2021-02-05 DIAGNOSIS — R2689 Other abnormalities of gait and mobility: Secondary | ICD-10-CM | POA: Diagnosis not present

## 2021-02-05 DIAGNOSIS — E8809 Other disorders of plasma-protein metabolism, not elsewhere classified: Secondary | ICD-10-CM | POA: Diagnosis present

## 2021-02-05 DIAGNOSIS — M4854XA Collapsed vertebra, not elsewhere classified, thoracic region, initial encounter for fracture: Secondary | ICD-10-CM | POA: Diagnosis not present

## 2021-02-05 DIAGNOSIS — K5900 Constipation, unspecified: Secondary | ICD-10-CM | POA: Diagnosis present

## 2021-02-05 DIAGNOSIS — N3289 Other specified disorders of bladder: Secondary | ICD-10-CM | POA: Diagnosis not present

## 2021-02-05 DIAGNOSIS — K429 Umbilical hernia without obstruction or gangrene: Secondary | ICD-10-CM | POA: Insufficient documentation

## 2021-02-05 DIAGNOSIS — Z853 Personal history of malignant neoplasm of breast: Secondary | ICD-10-CM | POA: Diagnosis not present

## 2021-02-05 DIAGNOSIS — R339 Retention of urine, unspecified: Secondary | ICD-10-CM | POA: Insufficient documentation

## 2021-02-05 DIAGNOSIS — I959 Hypotension, unspecified: Secondary | ICD-10-CM | POA: Diagnosis not present

## 2021-02-05 DIAGNOSIS — D638 Anemia in other chronic diseases classified elsewhere: Secondary | ICD-10-CM | POA: Diagnosis not present

## 2021-02-05 DIAGNOSIS — R338 Other retention of urine: Secondary | ICD-10-CM | POA: Diagnosis not present

## 2021-02-05 DIAGNOSIS — Z882 Allergy status to sulfonamides status: Secondary | ICD-10-CM | POA: Insufficient documentation

## 2021-02-05 DIAGNOSIS — R778 Other specified abnormalities of plasma proteins: Secondary | ICD-10-CM | POA: Diagnosis not present

## 2021-02-05 DIAGNOSIS — Z833 Family history of diabetes mellitus: Secondary | ICD-10-CM | POA: Insufficient documentation

## 2021-02-05 DIAGNOSIS — Z170 Estrogen receptor positive status [ER+]: Secondary | ICD-10-CM | POA: Insufficient documentation

## 2021-02-05 DIAGNOSIS — C50912 Malignant neoplasm of unspecified site of left female breast: Secondary | ICD-10-CM

## 2021-02-05 DIAGNOSIS — R4182 Altered mental status, unspecified: Secondary | ICD-10-CM | POA: Diagnosis not present

## 2021-02-05 LAB — BASIC METABOLIC PANEL
Anion gap: 8 (ref 5–15)
BUN: 24 mg/dL — ABNORMAL HIGH (ref 8–23)
CO2: 29 mmol/L (ref 22–32)
Calcium: 6.7 mg/dL — ABNORMAL LOW (ref 8.9–10.3)
Chloride: 96 mmol/L — ABNORMAL LOW (ref 98–111)
Creatinine, Ser: 1.08 mg/dL — ABNORMAL HIGH (ref 0.44–1.00)
GFR, Estimated: 51 mL/min — ABNORMAL LOW (ref >60)
Glucose, Bld: 163 mg/dL — ABNORMAL HIGH (ref 70–99)
Potassium: 3.9 mmol/L (ref 3.5–5.1)
Sodium: 133 mmol/L — ABNORMAL LOW (ref 135–145)

## 2021-02-05 NOTE — Progress Notes (Signed)
Per Rulon Abide, NP, no IV fluids needed today.

## 2021-02-05 NOTE — Progress Notes (Signed)
Hematology/Oncology follow up note [facility] [phone] [phone]   Patient Care Team: [name], MD as PCP - General (Family Medicine) Pyrtle, Lajuan Lines, MD as Consulting Physician (Gastroenterology) Telford Nab, RN as Registered Nurse Earlie Server, MD as Consulting Physician (Hematology and Oncology) Debbora Dus, [facility] as Pharmacist (Pharmacist) Burnice Logan, [facility] as Pharmacist (Pharmacist) Ralene Bathe, MD (Dermatology) Festus Aloe, MD as Consulting Physician (Urology)   REASON FOR VISIT:  Follow up for management of lung cancer and breast cancer  HISTORY OF PRESENTING ILLNESS:  Jamie Matthews is a  80 y.o.  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 05/05/2018 bilateral diagnostic breast mammogram showed suspicious mass 1.1cm  in the 12:00 retroareolar region of the left breast and the left axillary lymph node. 06/08/2018 patient status post a left breast retroareolar and left axillary lymph node biopsy. Pathology showed invasive mammary carcinoma, no special type, grade 1, left axillary lymph node positive for invasive mammary carcinoma clinically metastatic.  Background lymph node architecture is not identified. ER> 90% PR> 90%, HER-2 negative.  PET scan done which unfortunately showed additional hypermetabolic bilateral hilar lymph nodes as well as left upper lobe lung mass which may represent a focus of metastatic disease from breast, or primary lung neoplasm.  There is multiple additional small pulmonary nodules are scattered throughout both lungs which are worrisome for metastasis.  Index nodule within the medial right upper lobe measures 7 mm,, peri-broncho-vascular nodule in the right lower lobe measures 1.2 cm.  # Stage IV lung cancer-  Biopsy of lung mass left upper lobe showed non-small cell lung cancer, favor adenocarcinoma.  #NGS came back patient has PD L1  is 70% TPS, MET fusion mutation.  #Mid-March 2020 started on crizotinib  Bilateral lower extremity edema, right >left, Ultrasound venous right 09/20/2018 no DVT.  2D echo 10/10/2018 showed LVEF 55 to 60% Most likely secondary to crizotinib side effects.  # 12/15/2018 CT chest images were independently reviewed and discussed with patient. Dominant left upper lobe nodule and multiple scattered irregular pulmonary parenchymal nodular lesions are stable. Mild basilar pulmonary parenchymal septal thickening is new and can be seen with pulmonary edema. Patient reports no shortness of breath asymptomatic  #12/29/2018 unilateral left diagnostic mammogram with ultrasound images were independently reviewed and discussed with patient. Slight interval reduction of the size of the left retroareolar breast mass, 9 x 6 x 7 mm, comparing to 10 x 9 x 8 mm. Axillary lymph node measured 1.3 x 1.0 x 1.0 cm, previously 2.2 x 1.4 x 1.5 cm,  there were 2 other abdominal lymph nodes in the left axilla, which were not reported in previous ultrasound. Discussed with patient that overall, breast cancer responded to endocrine treatments.  Additional 2 lymph nodes need to be closely monitored.  Recommend patient to continue Arimidex.  INTERVAL HISTORY Patient presents today for follow-up and possible IV fluids.  She was recently hospitalized from 01/08/2021 - 01/11/2021 for hypoxia, acute respiratory failure.  Work-up showed new small bilateral pleural effusion without evidence of a PE.  Hypoxia was thought to be secondary to respiratory infection.  She was treated with IV steroids and discharged home.  Crizotinib was held during her admission.  She has slowly recovered from her recent hospitalization.  She has restarted her crizotinib.  She has worsening bilateral lower extremity swelling and wheezing.  Symptoms have been persistent since hospitalization.  She is on Lasix 40  mg daily per medication list from [facility].  She is a  poor historian.  Previously was walking using cane or walker and is now in a wheelchair.  She is on continuous oxygen.  She is a high fall risk.  Review of Systems  Constitutional:  Positive for fatigue. Negative for appetite change, fever and unexpected weight change.  HENT:   Negative for nosebleeds, sore throat and trouble swallowing.   Eyes: Negative.   Respiratory:  Positive for shortness of breath and wheezing. Negative for cough.   Cardiovascular:  Positive for leg swelling. Negative for chest pain.  Gastrointestinal:  Negative for abdominal pain, blood in stool, constipation, diarrhea, nausea and vomiting.  Endocrine: Negative.   Genitourinary: Negative.  Negative for bladder incontinence, hematuria and nocturia.   Musculoskeletal: Negative.  Negative for back pain and flank pain.  Skin: Negative.   Neurological: Negative.  Negative for dizziness, headaches, light-headedness and numbness.  Hematological: Negative.   Psychiatric/Behavioral: Negative.  Negative for confusion. The patient is not nervous/anxious.   All other systems reviewed and are negative.  MEDICAL HISTORY:  Past Medical History:  Diagnosis Date   AKI (acute kidney injury) ([place]) 08/17/2018   Anxiety    Arthritis    Belching    Bladder disorder    OVERACTIVE   Bowel dysfunction    BLOCKAGE   Cancer (HCC)    breast   Constipation    Depression    Diverticulitis    Fibromyalgia    GERD (gastroesophageal reflux disease)    Hyperlipidemia    IBS (irritable bowel syndrome)    Internal hemorrhoids    Lung cancer ([place]) 2020   Memory deficits    Murmur    asymptomatic   Pneumonia 11/18/12   Urinary incontinence    Vertigo     SURGICAL HISTORY: Past Surgical History:  Procedure Laterality Date   AXILLARY LYMPH NODE BIOPSY Left 06/08/2018   INVASIVE MAMMARY CARCINOMA   BLADDER SUSPENSION  2004, 2012   BREAST BIOPSY Left 06/08/2018   INVASIVE MAMMARY CARCINOMA   CATARACT EXTRACTION W/PHACO Right  08/27/2015   Procedure: CATARACT EXTRACTION PHACO AND INTRAOCULAR LENS PLACEMENT ([place]);  Surgeon: Estill Cotta, MD;  Location: [facility] ORS;  Service: Ophthalmology;  Laterality: Right;  [place]   1:00.2 AP%  22.5 CDE  23.67 fluid casette lot #[id number]  exp05/31/2018   CATARACT EXTRACTION W/PHACO Left 10/15/2015   Procedure: CATARACT EXTRACTION PHACO AND INTRAOCULAR LENS PLACEMENT (I[facility]);  Surgeon: Estill Cotta, MD;  Location: [facility] ORS;  Service: Ophthalmology;  Laterality: Left;  [place] 01:07 AP% 18.1 CDE 21.57 fluid pack lot # [id number]   CHOLECYSTECTOMY     COLONOSCOPY  2017   ELECTROMAGNETIC NAVIGATION BROCHOSCOPY N/A 07/09/2018   Procedure: ELECTROMAGNETIC NAVIGATION BRONCHOSCOPY;  Surgeon: Flora Lipps, MD;  Location: [facility] ORS;  Service: Cardiopulmonary;  Laterality: N/A;   TONSILLECTOMY  1947    SOCIAL HISTORY: Social History   Socioeconomic History   Marital status: Married    Spouse name: Scientist, forensic   Number of children: 0   Years of education: Master's Degree   Highest education level: Not on file  Occupational History   Occupation: Retired  Tobacco Use   Smoking status: Never   Smokeless tobacco: Never  Vaping Use   Vaping Use: Never used  Substance and Sexual Activity   Alcohol use: Not Currently    Comment: rare wine   Drug use: No   Sexual activity: Not Currently  Other Topics Concern   Not on file  Social History Narrative   Recently moved with her husband to [place] from [place].   Husband is a retired Pharmacist, community.   No children.   She is a retired Pharmacist, hospital.   Enjoys: Chief Technology Officer, reading - mysteries and biographies, cooking   Exercise: walking, gardening   Diet: low appetite due to cancer treatment, grazing   She is a DNR.   Social Determinants of Health   Financial Resource Strain: Not on file  Food Insecurity: Not on file  Transportation Needs: Not on file  Physical Activity: Not on file  Stress: Not on file  Social Connections: Not on file  Intimate  Partner Violence: Not on file    FAMILY HISTORY: Family History  Problem Relation Age of Onset   Diabetes Father    Heart disease Father    Lymphoma Father    Heart disease Mother    Breast cancer Sister 46   Colon cancer Neg Hx    Esophageal cancer Neg Hx    Rectal cancer Neg Hx    Stomach cancer Neg Hx    Bladder Cancer Neg Hx    Kidney cancer Neg Hx     ALLERGIES:  is allergic to sulfa antibiotics.  MEDICATIONS:  Current Outpatient Medications  Medication Sig Dispense Refill   acetaminophen (TYLENOL) 325 MG tablet Take 325 mg by mouth every 6 (six) hours as needed for mild pain.      albuterol (VENTOLIN HFA) 108 (90 Base) MCG/ACT inhaler Inhale 2 puffs into the lungs every 6 (six) hours as needed for wheezing or shortness of breath. 8 g 2   alum & mag hydroxide-simeth (MAALOX/MYLANTA) 200-200-20 MG/5ML suspension Take 30 mLs by mouth every 6 (six) hours as needed for indigestion or heartburn. 355 mL 0   anastrozole (ARIMIDEX) 1 MG tablet Take 1 tablet (1 mg total) by mouth daily. 90 tablet 1   benzonatate (TESSALON) 100 MG capsule TAKE 1 CAPSULE BY MOUTH 3 TIMES DAILY ASNEEDED FOR COUGH. 30 capsule 0   crizotinib (XALKORI) 250 MG capsule Take 1 capsule (250 mg total) by mouth 2 (two) times daily. 60 capsule 1   Dextromethorphan-guaiFENesin (MUCINEX DM) 30-600 MG TB12 Take 1 tablet by mouth 2 (two) times daily as needed (for congestion/cough).     fluticasone (FLONASE) 50 MCG/ACT nasal spray Place 2 sprays into both nostrils daily. 16 g 6   furosemide (LASIX) 40 MG tablet Take 40 mg by mouth.     levothyroxine (SYNTHROID) 25 MCG tablet TAKE 1 TABLET BY MOUTH DAILY 90 tablet 3   lubiprostone (AMITIZA) 24 MCG capsule TAKE ONE CAPSULE TWICE A DAY WITH MEALS 60 capsule 3   mirabegron ER (MYRBETRIQ) 50 MG TB24 tablet Take 1 tablet (50 mg total) by mouth daily. For overactive bladder. 90 tablet 0   Multiple Vitamin (MULTIVITAMIN WITH MINERALS) TABS tablet Take 1 tablet by mouth daily.      omeprazole (PRILOSEC) 40 MG capsule Take 40 mg by mouth daily.     ondansetron (ZOFRAN) 8 MG tablet TAKE ONE TABLET EVERY EIGHT HOURS AS NEEDED FOR NAUSEA / VOMITING 60 tablet 1   polyethylene glycol (MIRALAX / GLYCOLAX) 17 g packet Take 17 g by mouth 2 (two) times daily. 14 each 0   potassium chloride SA (KLOR-CON) 20 MEQ tablet Take 20 mEq by mouth 2 (two) times daily.     venlafaxine XR (EFFEXOR-XR) 150 MG 24 hr capsule TAKE 1 CAPSULE BY MOUTH EVERY DAY 90 capsule 1   Vitamin D, Ergocalciferol, (  DRISDOL) 1.25 MG (50000 UNIT) CAPS capsule Take 50,000 Units by mouth every Sunday at 6pm.     atorvastatin (LIPITOR) 20 MG tablet TAKE 1 TABLET BY MOUTH NIGHTLY (Patient not taking: No sig reported) 90 tablet 0   buPROPion (WELLBUTRIN XL) 150 MG 24 hr tablet TAKE ONE TABLET BY MOUTH EVERY DAY (Patient not taking: No sig reported) 90 tablet 2   Calcium-Magnesium-Vitamin D (CALCIUM 1200+D3 PO) Take 1 tablet by mouth daily. (Patient not taking: No sig reported)     docusate sodium (COLACE) 100 MG capsule TAKE 1 CAPSULE BY MOUTH DAILY (Patient not taking: No sig reported) 90 capsule 0   memantine (NAMENDA) 10 MG tablet Take 1 tablet (10 mg total) by mouth 2 (two) times daily. (Patient not taking: No sig reported) 180 tablet 2   predniSONE (DELTASONE) 20 MG tablet Take 0.5 tablets (10 mg total) by mouth daily. Take 40 mg starting on 8/19 x2 days, then 51m po daily x2 days then stop (Patient not taking: No sig reported)     No current facility-administered medications for this visit.     PHYSICAL EXAMINATION: ECOG PERFORMANCE STATUS: 2 - Symptomatic, <50% confined to bed Vitals:   02/05/21 1358  BP: (!) 124/53  Pulse: (!) 58  Resp: 19  Temp: (!) 97.2 F (36.2 C)  SpO2: 97%   Filed Weights    Physical Exam Constitutional:      Appearance: Normal appearance.  HENT:     Head: Normocephalic and atraumatic.  Eyes:     Pupils: Pupils are equal, round, and reactive to light.   Cardiovascular:     Rate and Rhythm: Normal rate and regular rhythm.     Heart sounds: Normal heart sounds. No murmur heard. Pulmonary:     Effort: Pulmonary effort is normal.     Breath sounds: Wheezing present.  Abdominal:     General: Bowel sounds are normal. There is no distension.     Palpations: Abdomen is soft.     Tenderness: There is no abdominal tenderness.  Musculoskeletal:        General: Normal range of motion.     Cervical back: Normal range of motion.     Right lower leg: 3+ Edema present.     Left lower leg: 3+ Edema present.  Skin:    General: Skin is warm and dry.     Findings: No rash.  Neurological:     Mental Status: She is alert and oriented to person, place, and time.     Gait: Gait is intact.  Psychiatric:        Mood and Affect: Mood and affect normal.        Cognition and Memory: Memory normal.        Judgment: Judgment normal.     CMP Latest Ref Rng & Units 02/05/2021  Glucose 70 - 99 mg/dL 163(H)  BUN 8 - 23 mg/dL 24(H)  Creatinine 0.44 - 1.00 mg/dL 1.08(H)  Sodium 135 - 145 mmol/L 133(L)  Potassium 3.5 - 5.1 mmol/L 3.9  Chloride 98 - 111 mmol/L 96(L)  CO2 22 - 32 mmol/L 29  Calcium 8.9 - 10.3 mg/dL 6.7(L)  Total Protein 6.5 - 8.1 g/dL -  Total Bilirubin 0.3 - 1.2 mg/dL -  Alkaline Phos 38 - 126 U/L -  AST 15 - 41 U/L -  ALT 0 - 44 U/L -   CBC Latest Ref Rng & Units 01/16/2021  WBC 4.0 - 10.5 K/uL 14.1(H)  Hemoglobin 12.0 -  15.0 g/dL 11.1(L)  Hematocrit 36.0 - 46.0 % 34.0(L)  Platelets 150 - 400 K/uL 381    LABORATORY DATA:  I have reviewed the data as listed Lab Results  Component Value Date   WBC 14.1 (H) 01/16/2021   HGB 11.1 (L) 01/16/2021   HCT 34.0 (L) 01/16/2021   MCV 94.7 01/16/2021   PLT 381 01/16/2021   Recent Labs    11/27/20 1442 12/28/20 1445 01/08/21 0837 01/09/21 0507 01/16/21 1342 02/05/21 1328  NA 138   < > 133* 135 129* 133*  K 3.7   < > 4.1 4.5 4.0 3.9  CL 102   < > 98 101 95* 96*  CO2 29   < > _0 GLUCOSE 112*   < > 139* 164* 115* 163*  BUN 23   < > 29* 26* 31* 24*  CREATININE 1.21*   < > 1.23* 1.14* 0.93 1.08*  CALCIUM 8.8*   < > 7.6* 7.4* 6.8* 6.7*  GFRNONAA 45*   < > 44* 48* >60 51*  PROT 6.6  --  5.3*  --  5.3*  --   ALBUMIN 3.7  --  2.8*  --  2.8*  --   AST 18  --  40  --  35  --   ALT 12  --  38  --  50*  --   ALKPHOS 81  --  70  --  83  --   BILITOT 0.7  --  0.7  --  <0.1*  --    < > = values in this interval not displayed.   Iron/TIBC/Ferritin/ %Sat No results found for: IRON, TIBC, FERRITIN, IRONPCTSAT   RADIOGRAPHIC STUDIES: I have personally reviewed the radiological images as listed and agreed with the findings in the report. DG Chest 2 View  Result Date: 12/28/2020 CLINICAL DATA:  Cough, wheezing and shortness of breath 1 week. EXAM: CHEST - 2 VIEW COMPARISON:  Chest CT 09/21/2020 FINDINGS: The cardiac silhouette, mediastinal and hilar contours are within normal limits and stable. Stable left upper lobe pulmonary lesion. Stable left upper lobe calcified granulomas. Stable mild eventration of the right hemidiaphragm. Mild streaky bibasilar atelectasis but no definite infiltrates or edema. Suspect small bilateral pleural effusions on the lateral film. Remote healed rib fractures are noted. IMPRESSION: 1. Stable left upper lobe pulmonary lesion. 2. Suspect small bilateral pleural effusions and bibasilar atelectasis. Electronically Signed   By: Marijo Sanes M.D.   On: 12/28/2020 15:35   CT Angio Chest PE W and/or Wo Contrast  Result Date: 01/08/2021 CLINICAL DATA:  Cough, congestion, shortness of breath. History of lung cancer and breast cancer. EXAM: CT ANGIOGRAPHY CHEST WITH CONTRAST TECHNIQUE: Multidetector CT imaging of the chest was performed using the standard protocol during bolus administration of intravenous contrast. Multiplanar CT image reconstructions and MIPs were obtained to evaluate the vascular anatomy. CONTRAST:  42m OMNIPAQUE IOHEXOL 350 MG/ML SOLN  COMPARISON:  CT chest dated September 21, 2020. FINDINGS: Cardiovascular: Satisfactory opacification of the pulmonary arteries to the segmental level. No evidence of pulmonary embolism. Normal heart size. No pericardial effusion. No thoracic aortic aneurysm or dissection. Mild atherosclerotic calcification of the aortic arch. Mediastinum/Nodes: No enlarged mediastinal, hilar, or axillary lymph nodes. Thyroid gland, and trachea demonstrate no significant findings. New moderate hiatal hernia and diffusely distended esophagus with air-fluid level. Lungs/Pleura: New small left greater than right pleural effusions. No consolidation or pneumothorax. Mild subsegmental atelectasis in the left lower lobe. Unchanged faintly  calcified 2.3 x 2.0 cm nodule in the apical left upper lobe (series 7, image 20). Unchanged 5 mm nodule in the right middle lobe (series 7, image 39). Upper Abdomen: No acute abnormality. Large amount of stool in the visualized colon. Musculoskeletal: No chest wall abnormality. No acute or significant osseous findings. Chronic T9 and T11 compression deformities again noted. Multiple old right-sided rib fractures again noted. Review of the MIP images confirms the above findings. IMPRESSION: 1. No evidence of pulmonary embolism. 2. New small bilateral pleural effusions. 3. New moderate hiatal hernia and diffusely distended esophagus with air-fluid level, suggestive of gastroesophageal reflux. 4. Unchanged 2.3 cm faintly calcified solid pulmonary nodule in the apical left upper lobe. 5. Aortic Atherosclerosis (ICD10-I70.0). Electronically Signed   By: Titus Dubin M.D.   On: 01/08/2021 12:50   DG Chest Portable 1 View  Result Date: 01/08/2021 CLINICAL DATA:  Shortness of breath and cough EXAM: PORTABLE CHEST 1 VIEW COMPARISON:  Eleven days ago FINDINGS: Low volume chest. Lucency under the right diaphragm has the appearance of colon. There is no edema, consolidation, effusion, or pneumothorax. Retrocardiac  density is from tortuous aorta. Chronic nodular density at the left apex. IMPRESSION: Stable low volume chest.  No focal pneumonia. Electronically Signed   By: Monte Fantasia M.D.   On: 01/08/2021 11:25       ASSESSMENT & PLAN:  1. Lower leg edema   2. Acute respiratory failure with hypoxia (HCC)   3. Wheezing   Cancer Staging Malignant neoplasm of left female breast [facility]) Staging form: Breast, AJCC 8th Edition - Clinical stage from 12/17/2018: Stage IB (cT1b, cN1, cM0, G1, ER+, PR+, HER2-) - Signed by Earlie Server, MD on 12/17/2018  Primary adenocarcinoma of lung [facility]) Staging form: Lung, AJCC 8th Edition - Clinical stage from 09/28/2018: Stage IVA (cT2, cN3, cM1a) - Signed by Earlie Server, MD on 09/28/2018  Stage IV lung adenocarcinoma- Currently on crizotinib 250 mg twice daily.  Appears to be tolerating the medication well except for significant fluid retention and +3 pitting edema in bilateral lower extremities.  She also has wheezing throughout her lungs.  Vital signs are stable.  Spoke with Dr. Tasia Catchings who recommends continuing crizotinib.  CT angio while in the hospital on 01/08/2021 showed small bilateral pleural effusions but a stable solid pulmonary nodule.  No other findings suggestive of progressive disease.  Return to clinic as scheduled in 2 weeks for follow-up with Dr. Tasia Catchings.  Swelling bilateral lower extremities and wheezing- Secondary to crizotinib.  She is currently on 250 mg twice daily.  Dr. Tasia Catchings recommends continuing medication as prescribed.  Her medication list from [facility] she is taking Lasix 40 mg daily.  Dr. Tasia Catchings would like to switch to as needed given her history of falls.  Hyponatremia- Previously has had low sodium levels.  Labs from today show a sodium level 133.  Creatinine is 1.08. Given amount of swelling, no additional IV fluids needed today.  I spent 15 minutes dedicated to the care of this patient (face-to-face and non-face-to-face) on the date of the encounter to include  what is described in the assessment and plan.  All questions were answered. The patient knows to call the clinic with any problems questions or concerns.  Faythe Casa, NP 02/05/2021 3:48 PM

## 2021-02-07 ENCOUNTER — Other Ambulatory Visit: Payer: Self-pay

## 2021-02-07 ENCOUNTER — Emergency Department: Payer: Medicare Other

## 2021-02-07 ENCOUNTER — Other Ambulatory Visit (HOSPITAL_COMMUNITY): Payer: Self-pay

## 2021-02-07 ENCOUNTER — Encounter: Payer: Self-pay | Admitting: Oncology

## 2021-02-07 ENCOUNTER — Inpatient Hospital Stay
Admission: EM | Admit: 2021-02-07 | Discharge: 2021-02-11 | DRG: 176 | Disposition: A | Discharge status: Skilled Nursing Facility | Payer: Medicare Other | Source: Skilled Nursing Facility | Attending: Student | Admitting: Student

## 2021-02-07 DIAGNOSIS — J309 Allergic rhinitis, unspecified: Secondary | ICD-10-CM | POA: Diagnosis present

## 2021-02-07 DIAGNOSIS — R7989 Other specified abnormal findings of blood chemistry: Secondary | ICD-10-CM | POA: Diagnosis present

## 2021-02-07 DIAGNOSIS — F329 Major depressive disorder, single episode, unspecified: Secondary | ICD-10-CM | POA: Diagnosis not present

## 2021-02-07 DIAGNOSIS — D638 Anemia in other chronic diseases classified elsewhere: Secondary | ICD-10-CM | POA: Diagnosis present

## 2021-02-07 DIAGNOSIS — I2694 Multiple subsegmental pulmonary emboli without acute cor pulmonale: Principal | ICD-10-CM

## 2021-02-07 DIAGNOSIS — Z8249 Family history of ischemic heart disease and other diseases of the circulatory system: Secondary | ICD-10-CM

## 2021-02-07 DIAGNOSIS — F0390 Unspecified dementia without behavioral disturbance: Secondary | ICD-10-CM | POA: Diagnosis present

## 2021-02-07 DIAGNOSIS — K5900 Constipation, unspecified: Secondary | ICD-10-CM | POA: Diagnosis present

## 2021-02-07 DIAGNOSIS — N3289 Other specified disorders of bladder: Secondary | ICD-10-CM | POA: Diagnosis not present

## 2021-02-07 DIAGNOSIS — K219 Gastro-esophageal reflux disease without esophagitis: Secondary | ICD-10-CM | POA: Diagnosis present

## 2021-02-07 DIAGNOSIS — E039 Hypothyroidism, unspecified: Secondary | ICD-10-CM | POA: Diagnosis present

## 2021-02-07 DIAGNOSIS — C50919 Malignant neoplasm of unspecified site of unspecified female breast: Secondary | ICD-10-CM | POA: Diagnosis not present

## 2021-02-07 DIAGNOSIS — I959 Hypotension, unspecified: Secondary | ICD-10-CM | POA: Diagnosis not present

## 2021-02-07 DIAGNOSIS — E8809 Other disorders of plasma-protein metabolism, not elsewhere classified: Secondary | ICD-10-CM | POA: Diagnosis present

## 2021-02-07 DIAGNOSIS — N179 Acute kidney failure, unspecified: Secondary | ICD-10-CM | POA: Diagnosis present

## 2021-02-07 DIAGNOSIS — R739 Hyperglycemia, unspecified: Secondary | ICD-10-CM | POA: Diagnosis present

## 2021-02-07 DIAGNOSIS — Z853 Personal history of malignant neoplasm of breast: Secondary | ICD-10-CM

## 2021-02-07 DIAGNOSIS — J90 Pleural effusion, not elsewhere classified: Secondary | ICD-10-CM | POA: Diagnosis not present

## 2021-02-07 DIAGNOSIS — Z66 Do not resuscitate: Secondary | ICD-10-CM | POA: Diagnosis present

## 2021-02-07 DIAGNOSIS — R4182 Altered mental status, unspecified: Secondary | ICD-10-CM | POA: Diagnosis not present

## 2021-02-07 DIAGNOSIS — Z9049 Acquired absence of other specified parts of digestive tract: Secondary | ICD-10-CM | POA: Diagnosis not present

## 2021-02-07 DIAGNOSIS — R9431 Abnormal electrocardiogram [ECG] [EKG]: Secondary | ICD-10-CM | POA: Diagnosis not present

## 2021-02-07 DIAGNOSIS — R0781 Pleurodynia: Secondary | ICD-10-CM | POA: Diagnosis not present

## 2021-02-07 DIAGNOSIS — J9601 Acute respiratory failure with hypoxia: Secondary | ICD-10-CM | POA: Diagnosis not present

## 2021-02-07 DIAGNOSIS — K449 Diaphragmatic hernia without obstruction or gangrene: Secondary | ICD-10-CM | POA: Diagnosis not present

## 2021-02-07 DIAGNOSIS — F419 Anxiety disorder, unspecified: Secondary | ICD-10-CM | POA: Diagnosis not present

## 2021-02-07 DIAGNOSIS — E871 Hypo-osmolality and hyponatremia: Secondary | ICD-10-CM | POA: Diagnosis present

## 2021-02-07 DIAGNOSIS — R5381 Other malaise: Secondary | ICD-10-CM | POA: Diagnosis not present

## 2021-02-07 DIAGNOSIS — I82443 Acute embolism and thrombosis of tibial vein, bilateral: Secondary | ICD-10-CM | POA: Diagnosis not present

## 2021-02-07 DIAGNOSIS — R778 Other specified abnormalities of plasma proteins: Secondary | ICD-10-CM | POA: Diagnosis not present

## 2021-02-07 DIAGNOSIS — Z20822 Contact with and (suspected) exposure to covid-19: Secondary | ICD-10-CM | POA: Diagnosis present

## 2021-02-07 DIAGNOSIS — M6281 Muscle weakness (generalized): Secondary | ICD-10-CM | POA: Diagnosis not present

## 2021-02-07 DIAGNOSIS — I2699 Other pulmonary embolism without acute cor pulmonale: Secondary | ICD-10-CM

## 2021-02-07 DIAGNOSIS — I82442 Acute embolism and thrombosis of left tibial vein: Secondary | ICD-10-CM | POA: Diagnosis present

## 2021-02-07 DIAGNOSIS — R0689 Other abnormalities of breathing: Secondary | ICD-10-CM | POA: Diagnosis not present

## 2021-02-07 DIAGNOSIS — I824Y2 Acute embolism and thrombosis of unspecified deep veins of left proximal lower extremity: Secondary | ICD-10-CM | POA: Diagnosis not present

## 2021-02-07 DIAGNOSIS — R296 Repeated falls: Secondary | ICD-10-CM | POA: Diagnosis not present

## 2021-02-07 DIAGNOSIS — Z743 Need for continuous supervision: Secondary | ICD-10-CM | POA: Diagnosis not present

## 2021-02-07 DIAGNOSIS — I10 Essential (primary) hypertension: Secondary | ICD-10-CM | POA: Diagnosis not present

## 2021-02-07 DIAGNOSIS — R338 Other retention of urine: Secondary | ICD-10-CM | POA: Diagnosis present

## 2021-02-07 DIAGNOSIS — N3281 Overactive bladder: Secondary | ICD-10-CM | POA: Diagnosis not present

## 2021-02-07 DIAGNOSIS — R0602 Shortness of breath: Secondary | ICD-10-CM | POA: Diagnosis present

## 2021-02-07 DIAGNOSIS — R4189 Other symptoms and signs involving cognitive functions and awareness: Secondary | ICD-10-CM | POA: Diagnosis not present

## 2021-02-07 DIAGNOSIS — Z882 Allergy status to sulfonamides status: Secondary | ICD-10-CM

## 2021-02-07 DIAGNOSIS — Z741 Need for assistance with personal care: Secondary | ICD-10-CM | POA: Diagnosis not present

## 2021-02-07 DIAGNOSIS — R911 Solitary pulmonary nodule: Secondary | ICD-10-CM | POA: Diagnosis not present

## 2021-02-07 DIAGNOSIS — E785 Hyperlipidemia, unspecified: Secondary | ICD-10-CM | POA: Diagnosis present

## 2021-02-07 DIAGNOSIS — J9611 Chronic respiratory failure with hypoxia: Secondary | ICD-10-CM | POA: Diagnosis present

## 2021-02-07 DIAGNOSIS — R2689 Other abnormalities of gait and mobility: Secondary | ICD-10-CM | POA: Diagnosis not present

## 2021-02-07 DIAGNOSIS — I248 Other forms of acute ischemic heart disease: Secondary | ICD-10-CM | POA: Diagnosis present

## 2021-02-07 DIAGNOSIS — M4854XA Collapsed vertebra, not elsewhere classified, thoracic region, initial encounter for fracture: Secondary | ICD-10-CM | POA: Diagnosis not present

## 2021-02-07 DIAGNOSIS — R069 Unspecified abnormalities of breathing: Secondary | ICD-10-CM | POA: Diagnosis not present

## 2021-02-07 DIAGNOSIS — R011 Cardiac murmur, unspecified: Secondary | ICD-10-CM | POA: Diagnosis present

## 2021-02-07 DIAGNOSIS — R062 Wheezing: Secondary | ICD-10-CM | POA: Diagnosis not present

## 2021-02-07 DIAGNOSIS — M797 Fibromyalgia: Secondary | ICD-10-CM | POA: Diagnosis present

## 2021-02-07 DIAGNOSIS — R339 Retention of urine, unspecified: Secondary | ICD-10-CM | POA: Diagnosis present

## 2021-02-07 DIAGNOSIS — F32A Depression, unspecified: Secondary | ICD-10-CM | POA: Diagnosis present

## 2021-02-07 DIAGNOSIS — Z803 Family history of malignant neoplasm of breast: Secondary | ICD-10-CM

## 2021-02-07 DIAGNOSIS — Z85118 Personal history of other malignant neoplasm of bronchus and lung: Secondary | ICD-10-CM

## 2021-02-07 DIAGNOSIS — I82412 Acute embolism and thrombosis of left femoral vein: Secondary | ICD-10-CM | POA: Diagnosis present

## 2021-02-07 DIAGNOSIS — I82432 Acute embolism and thrombosis of left popliteal vein: Secondary | ICD-10-CM | POA: Diagnosis not present

## 2021-02-07 DIAGNOSIS — Z79890 Hormone replacement therapy (postmenopausal): Secondary | ICD-10-CM

## 2021-02-07 DIAGNOSIS — J302 Other seasonal allergic rhinitis: Secondary | ICD-10-CM | POA: Diagnosis not present

## 2021-02-07 DIAGNOSIS — Z79899 Other long term (current) drug therapy: Secondary | ICD-10-CM

## 2021-02-07 DIAGNOSIS — Z79811 Long term (current) use of aromatase inhibitors: Secondary | ICD-10-CM

## 2021-02-07 DIAGNOSIS — R278 Other lack of coordination: Secondary | ICD-10-CM | POA: Diagnosis not present

## 2021-02-07 DIAGNOSIS — K589 Irritable bowel syndrome without diarrhea: Secondary | ICD-10-CM | POA: Diagnosis present

## 2021-02-07 DIAGNOSIS — R600 Localized edema: Secondary | ICD-10-CM | POA: Diagnosis not present

## 2021-02-07 DIAGNOSIS — R0902 Hypoxemia: Secondary | ICD-10-CM | POA: Diagnosis not present

## 2021-02-07 DIAGNOSIS — C3490 Malignant neoplasm of unspecified part of unspecified bronchus or lung: Secondary | ICD-10-CM | POA: Diagnosis not present

## 2021-02-07 LAB — CBC
HCT: 28.6 % — ABNORMAL LOW (ref 36.0–46.0)
Hemoglobin: 9.7 g/dL — ABNORMAL LOW (ref 12.0–15.0)
MCH: 31.6 pg (ref 26.0–34.0)
MCHC: 33.9 g/dL (ref 30.0–36.0)
MCV: 93.2 fL (ref 80.0–100.0)
Platelets: 415 10*3/uL — ABNORMAL HIGH (ref 150–400)
RBC: 3.07 MIL/uL — ABNORMAL LOW (ref 3.87–5.11)
RDW: 15 % (ref 11.5–15.5)
WBC: 11.7 10*3/uL — ABNORMAL HIGH (ref 4.0–10.5)
nRBC: 0.3 % — ABNORMAL HIGH (ref 0.0–0.2)

## 2021-02-07 LAB — BASIC METABOLIC PANEL
Anion gap: 8 (ref 5–15)
BUN: 22 mg/dL (ref 8–23)
CO2: 30 mmol/L (ref 22–32)
Calcium: 6.2 mg/dL — CL (ref 8.9–10.3)
Chloride: 96 mmol/L — ABNORMAL LOW (ref 98–111)
Creatinine, Ser: 1.3 mg/dL — ABNORMAL HIGH (ref 0.44–1.00)
GFR, Estimated: 41 mL/min — ABNORMAL LOW (ref >60)
Glucose, Bld: 182 mg/dL — ABNORMAL HIGH (ref 70–99)
Potassium: 3.6 mmol/L (ref 3.5–5.1)
Sodium: 134 mmol/L — ABNORMAL LOW (ref 135–145)

## 2021-02-07 LAB — HEPATIC FUNCTION PANEL
ALT: 37 U/L (ref 0–44)
AST: 42 U/L — ABNORMAL HIGH (ref 15–41)
Albumin: 2 g/dL — ABNORMAL LOW (ref 3.5–5.0)
Alkaline Phosphatase: 95 U/L (ref 38–126)
Bilirubin, Direct: <0.1 mg/dL (ref 0.0–0.2)
Total Bilirubin: 0.6 mg/dL (ref 0.3–1.2)
Total Protein: 5.2 g/dL — ABNORMAL LOW (ref 6.5–8.1)

## 2021-02-07 LAB — RESP PANEL BY RT-PCR (FLU A&B, COVID) ARPGX2
Influenza A by PCR: NEGATIVE
Influenza B by PCR: NEGATIVE
SARS Coronavirus 2 by RT PCR: NEGATIVE

## 2021-02-07 LAB — PROTIME-INR
INR: 1.1 (ref 0.8–1.2)
Prothrombin Time: 14.6 seconds (ref 11.4–15.2)

## 2021-02-07 LAB — MAGNESIUM: Magnesium: 2.7 mg/dL — ABNORMAL HIGH (ref 1.7–2.4)

## 2021-02-07 LAB — BRAIN NATRIURETIC PEPTIDE: B Natriuretic Peptide: 101.8 pg/mL — ABNORMAL HIGH (ref 0.0–100.0)

## 2021-02-07 LAB — APTT: aPTT: 31 seconds (ref 24–36)

## 2021-02-07 LAB — TROPONIN I (HIGH SENSITIVITY): Troponin I (High Sensitivity): 85 ng/L — ABNORMAL HIGH (ref <18)

## 2021-02-07 MED ORDER — LEVOTHYROXINE SODIUM 25 MCG PO TABS
25.0000 ug | ORAL_TABLET | Freq: Every day | ORAL | Status: DC
  Administered 2021-02-08 – 2021-02-11 (×4): 25 ug via ORAL
  Filled 2021-02-07 (×4): qty 1

## 2021-02-07 MED ORDER — BENZONATATE 100 MG PO CAPS: 100.0000 mg | ORAL_CAPSULE | Freq: Three times a day (TID) | ORAL | Status: DC | PRN

## 2021-02-07 MED ORDER — POLYETHYLENE GLYCOL 3350 17 G PO PACK: 17.0000 g | PACK | Freq: Two times a day (BID) | ORAL | Status: DC

## 2021-02-07 MED ORDER — POLYETHYLENE GLYCOL 3350 17 G PO PACK: 17.0000 g | PACK | Freq: Once | ORAL | Status: DC

## 2021-02-07 MED ORDER — SENNOSIDES-DOCUSATE SODIUM 8.6-50 MG PO TABS: 1.0000 | ORAL_TABLET | Freq: Two times a day (BID) | ORAL | Status: DC

## 2021-02-07 MED ORDER — HEPARIN (PORCINE) 25000 UT/250ML-% IV SOLN
850.0000 [IU]/h | INTRAVENOUS | Status: AC
  Administered 2021-02-07: 950 [IU]/h via INTRAVENOUS
  Administered 2021-02-08: 850 [IU]/h via INTRAVENOUS
  Filled 2021-02-07 (×2): qty 250

## 2021-02-07 MED ORDER — IOHEXOL 350 MG/ML SOLN
80.0000 mL | Freq: Once | INTRAVENOUS | Status: AC | PRN
  Administered 2021-02-07: 70 mL via INTRAVENOUS

## 2021-02-07 MED ORDER — HEPARIN BOLUS VIA INFUSION
4000.0000 [IU] | Freq: Once | INTRAVENOUS | Status: AC
  Administered 2021-02-07: 4000 [IU] via INTRAVENOUS
  Filled 2021-02-07: qty 4000

## 2021-02-07 MED ORDER — LUBIPROSTONE 24 MCG PO CAPS
24.0000 ug | ORAL_CAPSULE | Freq: Two times a day (BID) | ORAL | Status: DC
  Administered 2021-02-08 – 2021-02-11 (×7): 24 ug via ORAL
  Filled 2021-02-07 (×9): qty 1

## 2021-02-07 MED ORDER — CALCIUM GLUCONATE-NACL 1-0.675 GM/50ML-% IV SOLN
1.0000 g | Freq: Once | INTRAVENOUS | Status: AC
  Administered 2021-02-07: 1000 mg via INTRAVENOUS
  Filled 2021-02-07: qty 50

## 2021-02-07 MED ORDER — ANASTROZOLE 1 MG PO TABS
1.0000 mg | ORAL_TABLET | Freq: Every day | ORAL | Status: DC
  Administered 2021-02-08 – 2021-02-11 (×4): 1 mg via ORAL
  Filled 2021-02-07 (×4): qty 1

## 2021-02-07 MED ORDER — ALBUTEROL SULFATE HFA 108 (90 BASE) MCG/ACT IN AERS: 2.0000 | INHALATION_SPRAY | Freq: Four times a day (QID) | RESPIRATORY_TRACT | Status: DC | PRN

## 2021-02-07 MED ORDER — ONDANSETRON HCL 4 MG/2ML IJ SOLN: 4.0000 mg | Freq: Four times a day (QID) | INTRAMUSCULAR | Status: DC | PRN

## 2021-02-07 MED ORDER — ALBUTEROL SULFATE (2.5 MG/3ML) 0.083% IN NEBU: 2.5000 mg | INHALATION_SOLUTION | Freq: Four times a day (QID) | RESPIRATORY_TRACT | Status: DC | PRN

## 2021-02-07 MED ORDER — FLUTICASONE PROPIONATE 50 MCG/ACT NA SUSP
2.0000 | Freq: Every day | NASAL | Status: DC
  Administered 2021-02-08 – 2021-02-11 (×4): 2 via NASAL
  Filled 2021-02-07: qty 16

## 2021-02-07 MED ORDER — PANTOPRAZOLE SODIUM 40 MG PO TBEC
40.0000 mg | DELAYED_RELEASE_TABLET | Freq: Every day | ORAL | Status: DC
  Administered 2021-02-08 – 2021-02-11 (×4): 40 mg via ORAL
  Filled 2021-02-07 (×4): qty 1

## 2021-02-07 MED ORDER — CRIZOTINIB 250 MG PO CAPS: 250.0000 mg | ORAL_CAPSULE | Freq: Two times a day (BID) | ORAL | Status: DC

## 2021-02-07 MED ORDER — ACETAMINOPHEN 325 MG RE SUPP
650.0000 mg | Freq: Four times a day (QID) | RECTAL | Status: DC | PRN
  Filled 2021-02-07: qty 2

## 2021-02-07 MED ORDER — ACETAMINOPHEN 325 MG PO TABS
650.0000 mg | ORAL_TABLET | Freq: Four times a day (QID) | ORAL | Status: DC | PRN
  Administered 2021-02-11: 650 mg via ORAL
  Filled 2021-02-07: qty 2

## 2021-02-07 MED ORDER — LACTATED RINGERS IV SOLN: INTRAVENOUS | Status: AC

## 2021-02-07 NOTE — ED Notes (Signed)
Pt NAD in bed 14, a/ox2, HX dementia. BIBA for SOB, wheezing, breathing TX given en route. Pt slightly tachypnic, shallow, clear bilaterally. Pt states she has had several Bms today.

## 2021-02-07 NOTE — ED Provider Notes (Signed)
[facility] Emergency Department Provider Note  ____________________________________________   Event Date/Time   First MD Initiated Contact with Patient 02/07/21 1512     (approximate)  I have reviewed the triage vital signs and the nursing notes.   HISTORY  Chief Complaint Shortness of breath   HPI Jamie Matthews is a 80 y.o. female  left breast cancer stage Ib, primary adenocarcinoma of the lung stage IVa, hypothyroid, acid reflux, hyperlipidemia, depression, memory decline, aortic atherosclerosis,who comes in with shortness of breath.  Patient has wheezing throughout and desats down to 83% when off oxygen.  However patient is supposed to be on 2 L of oxygen so is unclear why patient was taken off of her oxygen.  Patient was given reportedly some Solu-Medrol and a DuoNeb.  Patient comes in looking very comfortable now with out any wheezing.  Patient does have worsening leg swelling and has been started on Lasix recently.  Sister is at bedside who is the POA who states that she thinks that that is new for her, constant.  She denies any abdominal pain or shortness of breath at this time.  She is on her baseline 2 L.  According to the sister she also had 1 episode of vomiting but at this time patient denies any abdominal pain but not the best historian.  Unable to get full HPI from patient due to her dementia    On review of records patient had admission last month for hypoxia where she was placed on 4 L of oxygen thought to be secondary to upper respiratory infection.  At this time she had a CT PE that was negative.       Past Medical History:  Diagnosis Date   AKI (acute kidney injury) ([place]) 08/17/2018   Anxiety    Arthritis    Belching    Bladder disorder    OVERACTIVE   Bowel dysfunction    BLOCKAGE   Cancer (HCC)    breast   Constipation    Depression    Diverticulitis    Fibromyalgia    GERD (gastroesophageal reflux disease)     Hyperlipidemia    IBS (irritable bowel syndrome)    Internal hemorrhoids    Lung cancer ([place]) 2020   Memory deficits    Murmur    asymptomatic   Pneumonia 11/18/12   Urinary incontinence    Vertigo     Patient Active Problem List   Diagnosis Date Noted   Acute respiratory failure with hypoxia ([place]) 02/05/2021   Shortness of breath 01/08/2021   Aortic valve stenosis 01/19/2020   Left hip pain 12/05/2019   Hypocalcemia 11/02/2019   Trochanteric bursitis 09/20/2019   Pruritus 09/06/2019   Overactive bladder 09/06/2019   Chronic pain of right knee 09/06/2019   Multiple rib fractures 07/15/2019   Fall as cause of accidental injury at home as place of occurrence 07/15/2019   Encounter for antineoplastic chemotherapy 12/17/2018   Lower leg edema 12/17/2018   Aromatase inhibitor use 10/24/2018   Osteopenia 10/24/2018   Fever 10/10/2018   Closed displaced fracture of pubis ([place]) 09/20/2018   Localized swelling of right lower leg 09/20/2018   AKI (acute kidney injury) ([place]) 08/17/2018   Myofascial pain 07/28/2018   Primary adenocarcinoma of lung ([place]) 07/21/2018   Malignant neoplasm of left female breast ([place]) 07/21/2018   Adenopathy    Goals of care, counseling/discussion 06/26/2018   IBS (irritable bowel syndrome) 05/25/2018   BPV (benign positional vertigo) 07/30/2017  Unilateral primary osteoarthritis, left knee 06/24/2017   Bunion of great toe of right foot 06/09/2017   Dizziness 03/24/2017   Decreased appetite 12/03/2016   Fatigue 09/22/2016   Insomnia 09/22/2016   Depression, recurrent ([place]) 09/22/2016   Chronic cough 08/21/2016   Primary localized osteoarthrosis, hand 03/19/2016   OA (osteoarthritis) of knee 03/17/2016   Stress due to illness of family member 10/10/2014   Ileitis 06/22/2014   Chronic insomnia 09/20/2013   DNR (do not resuscitate) 11/15/2012   Other constipation 04/01/2012   Memory loss 02/05/2012   Urinary incontinence    Major depressive  disorder, recurrent, moderate    Gastroesophageal reflux disease    Hyperlipidemia     Past Surgical History:  Procedure Laterality Date   AXILLARY LYMPH NODE BIOPSY Left 06/08/2018   INVASIVE MAMMARY CARCINOMA   BLADDER SUSPENSION  2004, 2012   BREAST BIOPSY Left 06/08/2018   INVASIVE MAMMARY CARCINOMA   CATARACT EXTRACTION W/PHACO Right 08/27/2015   Procedure: CATARACT EXTRACTION PHACO AND INTRAOCULAR LENS PLACEMENT ([place]);  Surgeon: Estill Cotta, MD;  Location: [facility] ORS;  Service: Ophthalmology;  Laterality: Right;  [place]   1:00.2 AP%  22.5 CDE  23.67 fluid casette lot #[id number]  exp05/31/2018   CATARACT EXTRACTION W/PHACO Left 10/15/2015   Procedure: CATARACT EXTRACTION PHACO AND INTRAOCULAR LENS PLACEMENT (IOC);  Surgeon: Estill Cotta, MD;  Location: [facility] ORS;  Service: Ophthalmology;  Laterality: Left;  [place] 01:07 AP% 18.1 CDE 21.57 fluid pack lot # [id number]   CHOLECYSTECTOMY     COLONOSCOPY  2017   ELECTROMAGNETIC NAVIGATION BROCHOSCOPY N/A 07/09/2018   Procedure: ELECTROMAGNETIC NAVIGATION BRONCHOSCOPY;  Surgeon: Flora Lipps, MD;  Location: [facility] ORS;  Service: Cardiopulmonary;  Laterality: N/A;   TONSILLECTOMY  1947    Prior to Admission medications   Medication Sig Start Date End Date Taking? Authorizing Provider  acetaminophen (TYLENOL) 325 MG tablet Take 325 mg by mouth every 6 (six) hours as needed for mild pain.     [provider]  albuterol (VENTOLIN HFA) 108 (90 Base) MCG/ACT inhaler Inhale 2 puffs into the lungs every 6 (six) hours as needed for wheezing or shortness of breath. 12/28/20   Duanne Guess, PA-C  alum & mag hydroxide-simeth (MAALOX/MYLANTA) 200-200-20 MG/5ML suspension Take 30 mLs by mouth every 6 (six) hours as needed for indigestion or heartburn. 01/10/21   Nolberto Hanlon, MD  anastrozole (ARIMIDEX) 1 MG tablet Take 1 tablet (1 mg total) by mouth daily. 06/25/20   Earlie Server, MD  atorvastatin (LIPITOR) 20 MG tablet TAKE 1 TABLET BY MOUTH  NIGHTLY Patient not taking: No sig reported 02/03/20   [name], MD  benzonatate (TESSALON) 100 MG capsule TAKE 1 CAPSULE BY MOUTH 3 TIMES DAILY ASNEEDED FOR COUGH. 12/10/20   [name], MD  buPROPion (WELLBUTRIN XL) 150 MG 24 hr tablet TAKE ONE TABLET BY MOUTH EVERY DAY Patient not taking: No sig reported 09/29/18   Lucille Passy, MD  Calcium-Magnesium-Vitamin D (CALCIUM 1200+D3 PO) Take 1 tablet by mouth daily. Patient not taking: No sig reported    [provider]  crizotinib [facility]ul[facility] Humphrey) 250 MG capsule Take 1 capsule (250 mg total) by mouth 2 (two) times daily. 01/17/21   Earlie Server, MD  Dextromethorphan-guaiFENesin [facility]) 30-600 MG TB12 Take 1 tablet by mouth 2 (two) times daily as needed (for congestion/cough).    [provider]  docusate sodium (COLACE) 100 MG capsule TAKE 1 CAPSULE BY MOUTH DAILY Patient not taking: No sig reported 10/16/20  Earlie Server, MD  fluticasone [facility]) 50 MCG/ACT nasal spray Place 2 sprays into both nostrils daily. 04/03/20   [name], MD  furosemide (LASIX) 40 MG tablet Take 40 mg by mouth.    [provider]  levothyroxine (SYNTHROID) 25 MCG tablet TAKE 1 TABLET BY MOUTH DAILY 02/29/20   [name], MD  lubiprostone (AMITIZA) 24 MCG capsule TAKE ONE CAPSULE TWICE A DAY WITH MEALS 02/09/18   Pyrtle, Lajuan Lines, MD  memantine (NAMENDA) 10 MG tablet Take 1 tablet (10 mg total) by mouth 2 (two) times daily. Patient not taking: No sig reported 10/19/20   [name], MD  mirabegron ER (MYRBETRIQ) 50 MG TB24 tablet Take 1 tablet (50 mg total) by mouth daily. For overactive bladder. 11/02/20   Pleas Koch, NP  Multiple Vitamin (MULTIVITAMIN WITH MINERALS) TABS tablet Take 1 tablet by mouth daily.    [provider]  omeprazole (PRILOSEC) 40 MG capsule Take 40 mg by mouth daily.    [provider]  ondansetron (ZOFRAN) 8 MG tablet TAKE ONE TABLET EVERY EIGHT HOURS AS NEEDED FOR NAUSEA / VOMITING 10/19/20    Earlie Server, MD  polyethylene glycol (MIRALAX / GLYCOLAX) 17 g packet Take 17 g by mouth 2 (two) times daily. 01/10/21   Nolberto Hanlon, MD  potassium chloride SA (KLOR-CON) 20 MEQ tablet Take 20 mEq by mouth 2 (two) times daily.    [provider]  predniSONE (DELTASONE) 20 MG tablet Take 0.5 tablets (10 mg total) by mouth daily. Take 40 mg starting on 8/19 x2 days, then 20mg  po daily x2 days then stop Patient not taking: No sig reported 01/10/21   Nolberto Hanlon, MD  venlafaxine XR (EFFEXOR-XR) 150 MG 24 hr capsule TAKE 1 CAPSULE BY MOUTH EVERY DAY 12/01/19   Harold Hedge, MD  Vitamin D, Ergocalciferol, (DRISDOL) 1.25 MG (50000 UNIT) CAPS capsule Take 50,000 Units by mouth every Sunday at 6pm. 12/20/19   [provider]    Allergies Sulfa antibiotics  Family History  Problem Relation Age of Onset   Diabetes Father    Heart disease Father    Lymphoma Father    Heart disease Mother    Breast cancer Sister 37   Colon cancer Neg Hx    Esophageal cancer Neg Hx    Rectal cancer Neg Hx    Stomach cancer Neg Hx    Bladder Cancer Neg Hx    Kidney cancer Neg Hx     Social History Social History   Tobacco Use   Smoking status: Never   Smokeless tobacco: Never  Vaping Use   Vaping Use: Never used  Substance Use Topics   Alcohol use: Not Currently    Comment: rare wine   Drug use: No      Review of Systems is limited secondary to patient's dementia but it is obtained by the sister Constitutional: No fever/chills Eyes: No visual changes. ENT: No sore throat. Cardiovascular: No chest pain Respiratory: Positive for SOB Gastrointestinal: No abdominal pain.  Vomiting no diarrhea.  No constipation. Genitourinary: Negative for dysuria. Musculoskeletal: Negative for back pain. Skin: Negative for rash. Neurological: Negative for headaches, focal weakness or numbness. All other ROS negative ____________________________________________   PHYSICAL EXAM:  VITAL  SIGNS: ED Triage Vitals  Enc Vitals Group     BP 02/07/21 1433 (!) 103/91     Pulse Rate 02/07/21 1433 62     Resp 02/07/21 1433 (!) 24  Temp 02/07/21 1433 98.1 F (36.7 C)     Temp Source 02/07/21 1433 Oral     SpO2 02/07/21 1428 98 %     Weight 02/07/21 1436 137 lb (62.1 kg)     Height 02/07/21 1436 5\' 2"  (1.575 m)     Head Circumference --      Peak Flow --      Pain Score 02/07/21 1436 0     Pain Loc --      Pain Edu? --      Excl. in [place]? --     Constitutional: Alert and oriented x2 .  Elderly female Eyes: Conjunctivae are normal. EOMI. Head: Atraumatic. Nose: No congestion/rhinnorhea. Mouth/Throat: Mucous membranes are moist.   Neck: No stridor. Trachea Midline. FROM Cardiovascular: Normal rate, regular rhythm. Grossly normal heart sounds.  Good peripheral circulation. Respiratory: No increased work of breathing on 2 L of oxygen without any wheezing Gastrointestinal: Soft and nontender. No distention. No abdominal bruits.  Musculoskeletal: 2+ edema bilaterally with some chronic venous stasis changes of her legs.  No joint effusions. Neurologic:  Normal speech and language. No gross focal neurologic deficits are appreciated.  Skin:  Skin is warm, dry and intact. No rash noted. Psychiatric: Mood and affect are normal. Speech and behavior are normal. GU: Deferred   ____________________________________________   LABS (all labs ordered are listed, but only abnormal results are displayed)  Labs Reviewed  CBC - Abnormal; Notable for the following components:      Result Value   WBC 11.7 (*)    RBC 3.07 (*)    Hemoglobin 9.7 (*)    HCT 28.6 (*)    Platelets 415 (*)    nRBC 0.3 (*)    All other components within normal limits  RESP PANEL BY RT-PCR (FLU A&B, COVID) ARPGX2  BASIC METABOLIC PANEL  BRAIN NATRIURETIC PEPTIDE  TROPONIN I (HIGH SENSITIVITY)   ____________________________________________   ED ECG REPORT I, Vanessa [place], the attending physician,  personally viewed and interpreted this ECG.  Normal sinus rate of 60, no ST elevation, no T wave inversions, QTC is 505 ____________________________________________  RADIOLOGY I, Vanessa [place], personally viewed and evaluated these images (plain radiographs) as part of my medical decision making, as well as reviewing the written report by the radiologist.  ED MD interpretation:  no pna   Official radiology report(s): DG Chest 2 View  Result Date: 02/07/2021 CLINICAL DATA:  Shortness of breath EXAM: CHEST - 2 VIEW COMPARISON:  Radiograph 01/08/2021, chest CT 01/08/2021 FINDINGS: Unchanged cardiomediastinal silhouette. There is no new focal airspace disease. Known faintly calcified nodule in the left apex is poorly visualized radiographically. Unchanged calcified more peripheral left apical calcified granuloma. There is no large pleural effusion or visible pneumothorax. Elevated right hemidiaphragm with colonic interposition. No acute osseous abnormality. Thoracic spondylosis. IMPRESSION: No new focal airspace disease. Electronically Signed   By: Maurine Simmering M.D.   On: 02/07/2021 16:01   CT HEAD WO CONTRAST (5MM)  Result Date: 02/07/2021 CLINICAL DATA:  Mental status change EXAM: CT HEAD WITHOUT CONTRAST TECHNIQUE: Contiguous axial images were obtained from the base of the skull through the vertex without intravenous contrast. COMPARISON:  MRI 10/24/2020, CT brain 10/31/2018 FINDINGS: Brain: No acute territorial infarction, hemorrhage or intracranial mass. Atrophy and chronic small vessel ischemic changes of the white matter. Stable ventricle size Vascular: No hyperdense vessels.  Carotid vascular calcification Skull: Normal. Negative for fracture or focal lesion. Sinuses/Orbits: No acute finding. Other: None IMPRESSION:  1. No CT evidence for acute intracranial abnormality. 2. Atrophy and chronic small vessel ischemic changes of the white matter Electronically Signed   By: Donavan Foil M.D.   On:  02/07/2021 17:29   CT Angio Chest PE W and/or Wo Contrast  Result Date: 02/07/2021 CLINICAL DATA:  wheezes throughout, whenever pt taken off any kind of O2, desaturates to 83%. Pt tachypnic. Abdominal distension EXAM: CT ANGIOGRAPHY CHEST CT ABDOMEN AND PELVIS WITH CONTRAST TECHNIQUE: Multidetector CT imaging of the chest was performed using the standard protocol during bolus administration of intravenous contrast. Multiplanar CT image reconstructions and MIPs were obtained to evaluate the vascular anatomy. Multidetector CT imaging of the abdomen and pelvis was performed using the standard protocol during bolus administration of intravenous contrast. CONTRAST:  26mL OMNIPAQUE IOHEXOL 350 MG/ML SOLN COMPARISON:  CT chest 03/23/2019 FINDINGS: CTA CHEST FINDINGS Cardiovascular: Satisfactory opacification of the pulmonary arteries to the segmental level. Pulmonary artery filling defects within the left lower lobe segmental and subsegmental pulmonary arteries, right upper lobe segmental and subsegmental pulmonary arteries, right lower lobe subsegmental pulmonary arteries, right middle lobe segmental and subsegmental pulmonary arteries. Some of these appear central and cords like but are not visualized on the prior CT angiography chest 01/08/2021. Normal heart size. No pericardial effusion. Atherosclerotic plaque of the thoracic aorta. Mediastinum/Nodes: No enlarged mediastinal, hilar, or axillary lymph nodes. Thyroid gland and trachea demonstrate no significant findings. At least small to moderate hiatal hernia. Poor visualization of the esophagus due to timing of contrast. Lungs/Pleura: Hazy airspace opacity peripherally within the right upper lobe (6:45). Likely subsegmental atelectasis at the left lower lobe. Similar-appearing 2.2 x 2.3 cm partially calcified pulmonary nodule (interval decrease in size from PET-CT 06/21/2018). Calcified pulmonary nodule at the left apex (6:28). Left peribronchovascular  calcifications likely representing old granulomatous disease (6:40, 57). Associated traction bronchiectasis and architectural distortion. No pulmonary mass. No pleural effusion. No pneumothorax. Musculoskeletal: No chest wall abnormality. No suspicious lytic or blastic osseous lesions. Old healed right rib fractures. No acute displaced fracture. Similar-appearing compression fractures of the T9 and T11 vertebral bodies. Multilevel degenerative changes of the spine. Degenerative changes of the visualized right hand. Review of the MIP images confirms the above findings. CT ABDOMEN and PELVIS FINDINGS Hepatobiliary: No focal liver abnormality. Status post cholecystectomy. No biliary dilatation. Pancreas: No focal lesion. Normal pancreatic contour. No surrounding inflammatory changes. No main pancreatic ductal dilatation. Spleen: Normal in size without focal abnormality. Adrenals/Urinary Tract: No adrenal nodule bilaterally. Bilateral kidneys enhance symmetrically. No hydronephrosis. No hydroureter. The urinary bladder is distended with urine. Stomach/Bowel: Stomach is within normal limits. No evidence of bowel wall thickening or dilatation. Stool throughout the colon. The appendix not definitely identified. Vascular/Lymphatic: No abdominal aorta or iliac aneurysm. Moderate to severe calcified and noncalcified atherosclerotic plaque of the aorta and its branches. No abdominal, pelvic, or inguinal lymphadenopathy. Reproductive: Uterus and bilateral adnexa are unremarkable. Other: No intraperitoneal free fluid. No intraperitoneal free gas. No organized fluid collection. Musculoskeletal: Tiny fat containing umbilical hernia. No suspicious lytic or blastic osseous lesions. No acute displaced fracture. Multilevel degenerative changes of the spine. Review of the MIP images confirms the above findings. IMPRESSION: 1. All three right lobe and left lower lobe segmental and subsegmental pulmonary emboli. No findings of right  heart strain. Possible developing pulmonary infarction in the right upper lobe. 2. At least small to moderate hiatal hernia. Poor visualization of the esophagus due to timing of contrast. 3. Similar-appearing 2.2 x 2.3 cm left apex  lobe partially calcified pulmonary nodule (interval decrease in size from PET-CT 06/21/2018). 4. Constipation and urinary bladder distended with urine with otherwise no acute intra-abdominal or intrapelvic abnormality. 5. Other imaging findings of potential clinical significance: Sequela of prior granulomatous disease of the left lung. Chronic T9 and T11 compression fractures. Aortic Atherosclerosis (ICD10-I70.0). These results were called by telephone at the time of interpretation on 02/07/2021 at 5:43 pm to provider [facility] , who verbally acknowledged these results. Electronically Signed   By: Iven Finn M.D.   On: 02/07/2021 18:01   CT ABDOMEN PELVIS W CONTRAST  Result Date: 02/07/2021 CLINICAL DATA:  wheezes throughout, whenever pt taken off any kind of O2, desaturates to 83%. Pt tachypnic. Abdominal distension EXAM: CT ANGIOGRAPHY CHEST CT ABDOMEN AND PELVIS WITH CONTRAST TECHNIQUE: Multidetector CT imaging of the chest was performed using the standard protocol during bolus administration of intravenous contrast. Multiplanar CT image reconstructions and MIPs were obtained to evaluate the vascular anatomy. Multidetector CT imaging of the abdomen and pelvis was performed using the standard protocol during bolus administration of intravenous contrast. CONTRAST:  46mL OMNIPAQUE IOHEXOL 350 MG/ML SOLN COMPARISON:  CT chest 03/23/2019 FINDINGS: CTA CHEST FINDINGS Cardiovascular: Satisfactory opacification of the pulmonary arteries to the segmental level. Pulmonary artery filling defects within the left lower lobe segmental and subsegmental pulmonary arteries, right upper lobe segmental and subsegmental pulmonary arteries, right lower lobe subsegmental pulmonary arteries, right  middle lobe segmental and subsegmental pulmonary arteries. Some of these appear central and cords like but are not visualized on the prior CT angiography chest 01/08/2021. Normal heart size. No pericardial effusion. Atherosclerotic plaque of the thoracic aorta. Mediastinum/Nodes: No enlarged mediastinal, hilar, or axillary lymph nodes. Thyroid gland and trachea demonstrate no significant findings. At least small to moderate hiatal hernia. Poor visualization of the esophagus due to timing of contrast. Lungs/Pleura: Hazy airspace opacity peripherally within the right upper lobe (6:45). Likely subsegmental atelectasis at the left lower lobe. Similar-appearing 2.2 x 2.3 cm partially calcified pulmonary nodule (interval decrease in size from PET-CT 06/21/2018). Calcified pulmonary nodule at the left apex (6:28). Left peribronchovascular calcifications likely representing old granulomatous disease (6:40, 57). Associated traction bronchiectasis and architectural distortion. No pulmonary mass. No pleural effusion. No pneumothorax. Musculoskeletal: No chest wall abnormality. No suspicious lytic or blastic osseous lesions. Old healed right rib fractures. No acute displaced fracture. Similar-appearing compression fractures of the T9 and T11 vertebral bodies. Multilevel degenerative changes of the spine. Degenerative changes of the visualized right hand. Review of the MIP images confirms the above findings. CT ABDOMEN and PELVIS FINDINGS Hepatobiliary: No focal liver abnormality. Status post cholecystectomy. No biliary dilatation. Pancreas: No focal lesion. Normal pancreatic contour. No surrounding inflammatory changes. No main pancreatic ductal dilatation. Spleen: Normal in size without focal abnormality. Adrenals/Urinary Tract: No adrenal nodule bilaterally. Bilateral kidneys enhance symmetrically. No hydronephrosis. No hydroureter. The urinary bladder is distended with urine. Stomach/Bowel: Stomach is within normal limits.  No evidence of bowel wall thickening or dilatation. Stool throughout the colon. The appendix not definitely identified. Vascular/Lymphatic: No abdominal aorta or iliac aneurysm. Moderate to severe calcified and noncalcified atherosclerotic plaque of the aorta and its branches. No abdominal, pelvic, or inguinal lymphadenopathy. Reproductive: Uterus and bilateral adnexa are unremarkable. Other: No intraperitoneal free fluid. No intraperitoneal free gas. No organized fluid collection. Musculoskeletal: Tiny fat containing umbilical hernia. No suspicious lytic or blastic osseous lesions. No acute displaced fracture. Multilevel degenerative changes of the spine. Review of the MIP images confirms the above findings.  IMPRESSION: 1. All three right lobe and left lower lobe segmental and subsegmental pulmonary emboli. No findings of right heart strain. Possible developing pulmonary infarction in the right upper lobe. 2. At least small to moderate hiatal hernia. Poor visualization of the esophagus due to timing of contrast. 3. Similar-appearing 2.2 x 2.3 cm left apex lobe partially calcified pulmonary nodule (interval decrease in size from PET-CT 06/21/2018). 4. Constipation and urinary bladder distended with urine with otherwise no acute intra-abdominal or intrapelvic abnormality. 5. Other imaging findings of potential clinical significance: Sequela of prior granulomatous disease of the left lung. Chronic T9 and T11 compression fractures. Aortic Atherosclerosis (ICD10-I70.0). These results were called by telephone at the time of interpretation on 02/07/2021 at 5:43 pm to provider [facility] , who verbally acknowledged these results. Electronically Signed   By: Iven Finn M.D.   On: 02/07/2021 18:01    ____________________________________________   PROCEDURES  Procedure(s) performed (including Critical Care):  .1-3 Lead EKG Interpretation Performed by: Vanessa Jal, MD Authorized by: Vanessa [place], MD      Interpretation: normal     ECG rate:  60s   ECG rate assessment: normal     Rhythm: sinus rhythm     Ectopy: none     Conduction: normal   .Critical Care Performed by: Vanessa [place], MD Authorized by: Vanessa , MD   Critical care provider statement:    Critical care time (minutes):  45   Critical care was necessary to treat or prevent imminent or life-threatening deterioration of the following conditions: PE requiring heparin.   Critical care was time spent personally by me on the following activities:  Discussions with consultants, evaluation of patient's response to treatment, examination of patient, ordering and performing treatments and interventions, ordering and review of laboratory studies, ordering and review of radiographic studies, pulse oximetry, re-evaluation of patient's condition, obtaining history from patient or surrogate and review of old charts   ____________________________________________   INITIAL IMPRESSION / ASSESSMENT AND PLAN / ED COURSE   Jamie Matthews was evaluated in Emergency Department on 02/07/2021 for the symptoms described in the history of present illness. She was evaluated in the context of the global COVID-19 pandemic, which necessitated consideration that the patient might be at risk for infection with the SARS-CoV-2 virus that causes COVID-19. Institutional protocols and algorithms that pertain to the evaluation of patients at risk for COVID-19 are in a state of rapid change based on information released by regulatory bodies including the CDC and federal and state organizations. These policies and algorithms were followed during the patient's care in the ED.     Pt presents with SOB.  Could be CHF given the leg swelling differential includes: PNA-will get xray to evaluation Anemia-CBC to evaluate ACS- will get trops Arrhythmia-Will get EKG and keep on monitor.  COVID- will get testing per algorithm. PE-consider if chest x-ray is  negative for any edema    Troponin slightly elevated at 85 we will continue to trend out BNP is elevated at 101 which is up from a month ago and is consistent with her significant leg swelling Her calcium is significantly low at 6.2 and her QTC is prolonged so we will give some calcium gluconate. Her hemoglobin is around her baseline and her white count is slightly elevated but downtrending from prior  Her chest x-ray does not show any evidence of significant edema to explain her shortness of breath.  Given her initial presentation with shortness  of breath will get CT PE to rule valuate for PE.  Will get CT head given the concern that patient's been more weak and confused as well as CT abdomen given unable to get a reliable exam and the possibility of patient having some vomiting this morning.  CT scan concerning for PE with no right heart strain but possible pulmonary infarct.  Also concern for some constipation and urine bladder distention.  We will try to help the patient have a bowel movement and monitor for any signs of urinary retention we will try to hold off on Foley if possible.  We will discuss hospital team for admission for new PE   ____________________________________________   FINAL CLINICAL IMPRESSION(S) / ED DIAGNOSES   Final diagnoses:  Multiple subsegmental pulmonary emboli without acute cor pulmonale (HCC)  Hypocalcemia     MEDICATIONS GIVEN DURING THIS VISIT:  Medications  heparin ADULT infusion 100 units/mL (25000 units/243mL) (950 Units/hr Intravenous New Bag/Given 02/07/21 1825)  calcium gluconate 1 g/ 50 mL sodium chloride IVPB (0 mg Intravenous Stopped 02/07/21 1709)  iohexol (OMNIPAQUE) 350 MG/ML injection 80 mL (70 mLs Intravenous Contrast Given 02/07/21 1645)  heparin bolus via infusion 4,000 Units (4,000 Units Intravenous Bolus from Bag 02/07/21 1826)     ED Discharge Orders     None        Note:  This document was prepared using Dragon voice  recognition software and may include unintentional dictation errors.   Vanessa Cartwright, MD 02/07/21 [phone]

## 2021-02-07 NOTE — ED Notes (Signed)
MD Jari Pigg at bedside

## 2021-02-07 NOTE — Progress Notes (Signed)
ANTICOAGULATION CONSULT NOTE  Pharmacy Consult for heparin Indication: pulmonary embolus  Allergies  Allergen Reactions   Sulfa Antibiotics Rash    Patient Measurements: Height: 5\' 2"  (157.5 cm) Weight: 62.1 kg (137 lb) IBW/kg (Calculated) : 50.1 Heparin Dosing Weight: 62 kg  Vital Signs: Temp: 98.1 F (36.7 C) (09/15 1433) Temp Source: Oral (09/15 1433) BP: 103/91 (09/15 1433) Pulse Rate: 62 (09/15 1433)  Labs: Recent Labs    02/05/21 1328 02/07/21 1449  HGB  --  9.7*  HCT  --  28.6*  PLT  --  415*  CREATININE 1.08* 1.30*  TROPONINIHS  --  85*    Estimated Creatinine Clearance: 28.9 mL/min (A) (by C-G formula based on SCr of 1.3 mg/dL (H)).   Medical History: Past Medical History:  Diagnosis Date   AKI (acute kidney injury) ([place]) 08/17/2018   Anxiety    Arthritis    Belching    Bladder disorder    OVERACTIVE   Bowel dysfunction    BLOCKAGE   Cancer (HCC)    breast   Constipation    Depression    Diverticulitis    Fibromyalgia    GERD (gastroesophageal reflux disease)    Hyperlipidemia    IBS (irritable bowel syndrome)    Internal hemorrhoids    Lung cancer ([place]) 2020   Memory deficits    Murmur    asymptomatic   Pneumonia 11/18/12   Urinary incontinence    Vertigo      Assessment: 80 year old female presented with SOB. CTA positive all three right lobe and left lower lobe segmental and subsegmental pulmonary emboli. No anticoagulation note PTA. Pharmacy consult for heparin management.  Goal of Therapy:  Heparin level 0.3-0.7 units/ml Monitor platelets by anticoagulation protocol: Yes   Plan:  Heparin 4000 unit bolus Heparin infusion at 950 units/hr Check HL 9/16 at 0300 CBC daily while on heparin  Tawnya Crook, PharmD, BCPS Clinical Pharmacist 02/07/2021 6:27 PM

## 2021-02-07 NOTE — ED Triage Notes (Signed)
BIBA for SOB/resp failure, HX of resp failure. Per EMS, wheezes throughout, whenever pt taken off any kind of O2, desaturates to 83%. Pt tachypnic, WOB mild.

## 2021-02-07 NOTE — ED Notes (Signed)
Pt return from CT.

## 2021-02-07 NOTE — Progress Notes (Signed)
Patient arrived to room 2A47 from ED.  Assessment complete, VS obtained, and Admission database began.

## 2021-02-07 NOTE — ED Notes (Signed)
Patient transported to CT 

## 2021-02-07 NOTE — ED Notes (Signed)
Pt repositioned in bed, given sips of water per request.

## 2021-02-07 NOTE — ED Notes (Signed)
Pt sleeping in bed, arrousable. Reminded to keep [place] in nose. VSS

## 2021-02-07 NOTE — H&P (Signed)
History and Physical    PLEASE NOTE THAT [employer]AGON DICT[employer] SOFTWARE WAS USED IN THE CONSTRUCTION OF THIS NOTE.   Aadvika Konen GXQ:[id number] DOB: [birth year] DOA: 02/07/2021  PCP: Venia Carbon, MD Patient coming from: snf  I have personally briefly reviewed patient's old medical records in [place]  Chief Complaint: Shortness of breath  HPI: Jamie Matthews is a 80 y.o. female with medical history significant for chronic hypoxic respiratory failure on baseline continuous 2 L [place] in the setting of lung cancer, breast cancer, urinary incontinence, acquired hypothyroidism, Who is admitted to [facility] on 02/07/2021 with acute bilateral pulmonary emboli after presenting from SNF to [facility] ED for further evaluation of shortness of breath.   Following history is provided via the patient, SNF staff, my discussions with the EDP, in addition to chart review.  The patient has been experiencing 2 to 3 days progressive shortness of breath with new onset expiratory wheezing.  SNF staff also noted associated mild increase in work breathing over that timeframe EMS who brought the patient to [facility] ED for further evaluation of the above.  Not associate with any recent orthopnea, PND, or new onset peripheral edema. No recent chest pain, diaphoresis, palpitations, N/V, pre-syncope, or syncope. Not associated with any recent cough, wheezing, hemoptysis, new lower extremity erythema, or calf tenderness. Denies any recent trauma, travel, surgical procedures.  No report of any associated recent subjective fever, chills, rigors, or generalized myalgias.  No recent known COVID-19 exposures.  No known history of a previous pulmonary embolism or DVT.  Not on any blood thinners as an outpatient, including no aspirin.  Medical history notable for chronic hypoxic respiratory failure on baseline continuous 2 L nasal cannula in the setting of lung cancer.  She also has  a history of breast cancer. Reports that she is a lifelong non-smoker.  Per chart review, most recent echocardiogram occurred in May 2020 and was notable for normal left ventricular cavity size, mild LVH, LVEF 55 to 60%, indeterminate diastolic parameters, mild aortic stenosis associate with aortic valve area 1.18 cm2.  Per chart review, baseline creatinine range is 1.0-1.4.     ED Course:  Vital signs in the ED were notable for the following: Afebrile; heart rate 60-67; blood pressure 103/91 -117/58; respiratory rate 15-24; oxygen saturation 94 to 90% on her baseline continuous 2 L nasal cannula.  Labs were notable for the following: CMP notable for the following: Sodium 134, which corrects to 135 when taking into account concomitant mild hyperglycemia, potassium 3.6, creatinine 30 creatinine 1.30, calcium 6.2, which corrects to 7.8 following correction for hypoalbuminemia, albumin 2.0.  BNP 102 relative to most recent prior value of 23 on 01/08/2021.  High-sensitivity troponin I x1 found to be 85, compared to most recent prior value of 18 on 01/08/2021.  CBC notable for white blood cell count 11,700.  Most recent prior value 14,100 on 01/16/2021, hemoglobin 9.7.  INR 1.1.  Screening COVID-19/influenza PCR were checked in the ED today found to be negative.  Imaging and additional notable ED work-up: EKG shows sinus rhythm with heart rate 60, QTc 505 ms, no evidence of T wave or ST changes, including no evidence of ST elevation.  Chest x-ray shows no evidence of acute cardiopulmonary process, including no evidence of infiltrate, edema, effusion, or pneumothorax.  CTA chest shows segmental and subsegmental pulmonary emboli in the right upper, middle, and lower lobes as well as segmental and subsegmental pulmonary emboli in  the left lower lobe, without evidence of right heart strain; CTA chest also shows similar-appearing 2.2 x 2.3 cm left apical lobe partially calcified pulmonary nodule, with interval  decrease in size relative to PET scan from January 2020.  The setting of mild abdominal tenderness on exam, CT abdomen/pelvis showed evidence of constipation as well as urinary bladder distended with urine, but otherwise no evidence of acute intra-abdominal or acute intrapelvic process.   While in the ED, the following were administered: Heparin bolus followed by initiation of heparin drip; MiraLAX 17 g p.o. x1, calcium gluconate 1 g IV x1.  Subsequently, the patient was admitted to the PCU for further evaluation management of presenting acute bilateral pulmonary emboli.      Review of Systems: As per HPI otherwise 10 point review of systems negative.   Past Medical History:  Diagnosis Date   AKI (acute kidney injury) ([place]) 08/17/2018   Anxiety    Arthritis    Belching    Bladder disorder    OVERACTIVE   Bowel dysfunction    BLOCKAGE   Cancer (HCC)    breast   Constipation    Depression    Diverticulitis    Fibromyalgia    GERD (gastroesophageal reflux disease)    Hyperlipidemia    IBS (irritable bowel syndrome)    Internal hemorrhoids    Lung cancer ([place]) 2020   Memory deficits    Murmur    asymptomatic   Pneumonia 11/18/12   Urinary incontinence    Vertigo     Past Surgical History:  Procedure Laterality Date   AXILLARY LYMPH NODE BIOPSY Left 06/08/2018   INVASIVE MAMMARY CARCINOMA   BLADDER SUSPENSION  2004, 2012   BREAST BIOPSY Left 06/08/2018   INVASIVE MAMMARY CARCINOMA   CATARACT EXTRACTION W/PHACO Right 08/27/2015   Procedure: CATARACT EXTRACTION PHACO AND INTRAOCULAR LENS PLACEMENT ([place]);  Surgeon: Estill Cotta, MD;  Location: [facility] ORS;  Service: Ophthalmology;  Laterality: Right;  [place]   1:00.2 AP%  22.5 CDE  23.67 fluid casette lot #[id number]  exp05/31/2018   CATARACT EXTRACTION W/PHACO Left 10/15/2015   Procedure: CATARACT EXTRACTION PHACO AND INTRAOCULAR LENS PLACEMENT ([facility]);  Surgeon: Estill Cotta, MD;  Location: [facility] ORS;  Service: Ophthalmology;   Laterality: Left;  [place] 01:07 AP% 18.1 CDE 21.57 fluid pack lot # [id number]   CHOLECYSTECTOMY     COLONOSCOPY  2017   ELECTROMAGNETIC NAVIGATION BROCHOSCOPY N/A 07/09/2018   Procedure: ELECTROMAGNETIC NAVIGATION BRONCHOSCOPY;  Surgeon: Flora Lipps, MD;  Location: [facility] ORS;  Service: Cardiopulmonary;  Laterality: N/A;   TONSILLECTOMY  1947    Social History:  reports that she has never smoked. She has never used smokeless tobacco. She reports that she does not currently use alcohol. She reports that she does not use drugs.   Allergies  Allergen Reactions   Sulfa Antibiotics Rash    Family History  Problem Relation Age of Onset   Diabetes Father    Heart disease Father    Lymphoma Father    Heart disease Mother    Breast cancer Sister 63   Colon cancer Neg Hx    Esophageal cancer Neg Hx    Rectal cancer Neg Hx    Stomach cancer Neg Hx    Bladder Cancer Neg Hx    Kidney cancer Neg Hx     Family history reviewed and not pertinent    Prior to Admission medications   Medication Sig Start Date End Date Taking? Authorizing Provider  acetaminophen (TYLENOL) 325 MG  tablet Take 325 mg by mouth every 6 (six) hours as needed for mild pain.     [provider]  albuterol (VENTOLIN HFA) 108 (90 Base) MCG/ACT inhaler Inhale 2 puffs into the lungs every 6 (six) hours as needed for wheezing or shortness of breath. 12/28/20   Duanne Guess, PA-C  alum & mag hydroxide-simeth (MAALOX/MYLANTA) 200-200-20 MG/5ML suspension Take 30 mLs by mouth every 6 (six) hours as needed for indigestion or heartburn. 01/10/21   Nolberto Hanlon, MD  anastrozole (ARIMIDEX) 1 MG tablet Take 1 tablet (1 mg total) by mouth daily. 06/25/20   Earlie Server, MD  atorvastatin (LIPITOR) 20 MG tablet TAKE 1 TABLET BY MOUTH NIGHTLY Patient not taking: No sig reported 02/03/20   [name], MD  benzonatate (TESSALON) 100 MG capsule TAKE 1 CAPSULE BY MOUTH 3 TIMES DAILY ASNEEDED FOR COUGH. 12/10/20   [name], MD   buPROPion (WELLBUTRIN XL) 150 MG 24 hr tablet TAKE ONE TABLET BY MOUTH EVERY DAY Patient not taking: No sig reported 09/29/18   Lucille Passy, MD  Calcium-Magnesium-Vitamin D (CALCIUM 1200+D3 PO) Take 1 tablet by mouth daily. Patient not taking: No sig reported    [provider]  crizotinib [facility]rey) 250 MG capsule Take 1 capsule (250 mg total) by mouth 2 (two) times daily. 01/17/21   Earlie Server, MD  Dextromethorphan-guaiFENesin [facility] DM) 30-600 MG TB12 Take 1 tablet by mouth 2 (two) times daily as needed (for congestion/cough).    [provider]  docusate sodium (COLACE) 100 MG capsule TAKE 1 CAPSULE BY MOUTH DAILY Patient not taking: No sig reported 10/16/20   Earlie Server, MD  fluticasone [facility]) 50 MCG/ACT nasal spray Place 2 sprays into both nostrils daily. 04/03/20   [name], MD  furosemide (LASIX) 40 MG tablet Take 40 mg by mouth.    [provider]  levothyroxine (SYNTHROID) 25 MCG tablet TAKE 1 TABLET BY MOUTH DAILY 02/29/20   [name], MD  lubiprostone (AMITIZA) 24 MCG capsule TAKE ONE CAPSULE TWICE A DAY WITH MEALS 02/09/18   Pyrtle, Lajuan Lines, MD  memantine (NAMENDA) 10 MG tablet Take 1 tablet (10 mg total) by mouth 2 (two) times daily. Patient not taking: No sig reported 10/19/20   [name], MD  mirabegron ER (MYRBETRIQ) 50 MG TB24 tablet Take 1 tablet (50 mg total) by mouth daily. For overactive bladder. 11/02/20   Pleas Koch, NP  Multiple Vitamin (MULTIVITAMIN WITH MINERALS) TABS tablet Take 1 tablet by mouth daily.    [provider]  omeprazole (PRILOSEC) 40 MG capsule Take 40 mg by mouth daily.    [provider]  ondansetron (ZOFRAN) 8 MG tablet TAKE ONE TABLET EVERY EIGHT HOURS AS NEEDED FOR NAUSEA / VOMITING 10/19/20   Earlie Server, MD  polyethylene glycol (MIRALAX / GLYCOLAX) 17 g packet Take 17 g by mouth 2 (two) times daily. 01/10/21   Nolberto Hanlon, MD  potassium chloride SA (KLOR-CON) 20 MEQ tablet Take 20 mEq by  mouth 2 (two) times daily.    [provider]  predniSONE (DELTASONE) 20 MG tablet Take 0.5 tablets (10 mg total) by mouth daily. Take 40 mg starting on 8/19 x2 days, then 20mg  po daily x2 days then stop Patient not taking: No sig reported 01/10/21   Nolberto Hanlon, MD  venlafaxine XR (EFFEXOR-XR) 150 MG 24 hr capsule TAKE 1 CAPSULE BY MOUTH EVERY DAY 12/01/19   Harold Hedge, MD  Vitamin D,  Ergocalciferol, (DRISDOL) 1.25 MG (50000 UNIT) CAPS capsule Take 50,000 Units by mouth every Sunday at 6pm. 12/20/19   [provider]     Objective    Physical Exam: Vitals:   02/07/21 1431 02/07/21 1433 02/07/21 1436 02/07/21 1630  BP:  (!) 103/91  (!) 117/58  Pulse:  62  62  Resp:  (!) 24  (!) 22  Temp:  98.1 F (36.7 C)    TempSrc:  Oral    SpO2: 95% 95%  94%  Weight:   62.1 kg   Height:   5\' 2"  (1.575 m)     General: appears to be stated age; alert Skin: warm, dry, no rash Head:  AT/[place] Mouth:  Oral mucosa membranes appear dry, normal dentition Neck: supple; trachea midline Heart:  RRR; did not appreciate any M/R/G Lungs: CTAB, did not appreciate any wheezes, rales, or rhonchi Abdomen: + BS; soft, ND, NT Vascular: 2+ pedal pulses b/l; 2+ radial pulses b/l Extremities: no peripheral edema, no muscle wasting Neuro: strength and sensation intact in upper and lower extremities b/l   Labs on Admission: I have personally reviewed following labs and imaging studies  CBC: Recent Labs  Lab 02/07/21 1449  WBC 11.7*  HGB 9.7*  HCT 28.6*  MCV 93.2  PLT 182*   Basic Metabolic Panel: Recent Labs  Lab 02/05/21 1328 02/07/21 1449  NA 133* 134*  K 3.9 3.6  CL 96* 96*  CO2 29 30  GLUCOSE 163* 182*  BUN 24* 22  CREATININE 1.08* 1.30*  CALCIUM 6.7* 6.2*   GFR: Estimated Creatinine Clearance: 28.9 mL/min (A) (by C-G formula based on SCr of 1.3 mg/dL (H)). Liver Function Tests: No results for input(s): AST, ALT, ALKPHOS, BILITOT, PROT, ALBUMIN in the last 168  hours. No results for input(s): LIPASE, AMYLASE in the last 168 hours. No results for input(s): AMMONIA in the last 168 hours. Coagulation Profile: Recent Labs  Lab 02/07/21 1449  INR 1.1   Cardiac Enzymes: No results for input(s): CKTOTAL, CKMB, CKMBINDEX, TROPONINI in the last 168 hours. BNP (last 3 results) No results for input(s): PROBNP in the last 8760 hours. HbA1C: No results for input(s): HGBA1C in the last 72 hours. CBG: No results for input(s): GLUCAP in the last 168 hours. Lipid Profile: No results for input(s): CHOL, HDL, LDLCALC, TRIG, CHOLHDL, LDLDIRECT in the last 72 hours. Thyroid Function Tests: No results for input(s): TSH, T4TOTAL, FREET4, T3FREE, THYROIDAB in the last 72 hours. Anemia Panel: No results for input(s): VITAMINB12, FOLATE, FERRITIN, TIBC, IRON, RETICCTPCT in the last 72 hours. Urine analysis:    Component Value Date/Time   COLORURINE YELLOW (A) 01/08/2021 1300   APPEARANCEUR HAZY (A) 01/08/2021 1300   APPEARANCEUR Clear 09/23/2019 1103   LABSPEC 1.021 01/08/2021 1300   LABSPEC 1.027 06/10/2014 1759   PHURINE 7.0 01/08/2021 1300   GLUCOSEU NEGATIVE 01/08/2021 1300   GLUCOSEU Negative 06/10/2014 1759   HGBUR NEGATIVE 01/08/2021 1300   BILIRUBINUR NEGATIVE 01/08/2021 1300   BILIRUBINUR Negative 09/23/2019 1103   BILIRUBINUR Negative 06/10/2014 1759   KETONESUR NEGATIVE 01/08/2021 1300   PROTEINUR NEGATIVE 01/08/2021 1300   UROBILINOGEN 0.2 08/20/2017 1134   NITRITE NEGATIVE 01/08/2021 1300   LEUKOCYTESUR NEGATIVE 01/08/2021 1300   LEUKOCYTESUR Trace 06/10/2014 1759    Radiological Exams on Admission: DG Chest 2 View  Result Date: 02/07/2021 CLINICAL DATA:  Shortness of breath EXAM: CHEST - 2 VIEW COMPARISON:  Radiograph 01/08/2021, chest CT 01/08/2021 FINDINGS: Unchanged cardiomediastinal silhouette. There is no new  focal airspace disease. Known faintly calcified nodule in the left apex is poorly visualized radiographically. Unchanged  calcified more peripheral left apical calcified granuloma. There is no large pleural effusion or visible pneumothorax. Elevated right hemidiaphragm with colonic interposition. No acute osseous abnormality. Thoracic spondylosis. IMPRESSION: No new focal airspace disease. Electronically Signed   By: Maurine Simmering M.D.   On: 02/07/2021 16:01   CT HEAD WO CONTRAST (5MM)  Result Date: 02/07/2021 CLINICAL DATA:  Mental status change EXAM: CT HEAD WITHOUT CONTRAST TECHNIQUE: Contiguous axial images were obtained from the base of the skull through the vertex without intravenous contrast. COMPARISON:  MRI 10/24/2020, CT brain 10/31/2018 FINDINGS: Brain: No acute territorial infarction, hemorrhage or intracranial mass. Atrophy and chronic small vessel ischemic changes of the white matter. Stable ventricle size Vascular: No hyperdense vessels.  Carotid vascular calcification Skull: Normal. Negative for fracture or focal lesion. Sinuses/Orbits: No acute finding. Other: None IMPRESSION: 1. No CT evidence for acute intracranial abnormality. 2. Atrophy and chronic small vessel ischemic changes of the white matter Electronically Signed   By: Donavan Foil M.D.   On: 02/07/2021 17:29   CT Angio Chest PE W and/or Wo Contrast  Result Date: 02/07/2021 CLINICAL DATA:  wheezes throughout, whenever pt taken off any kind of O2, desaturates to 83%. Pt tachypnic. Abdominal distension EXAM: CT ANGIOGRAPHY CHEST CT ABDOMEN AND PELVIS WITH CONTRAST TECHNIQUE: Multidetector CT imaging of the chest was performed using the standard protocol during bolus administration of intravenous contrast. Multiplanar CT image reconstructions and MIPs were obtained to evaluate the vascular anatomy. Multidetector CT imaging of the abdomen and pelvis was performed using the standard protocol during bolus administration of intravenous contrast. CONTRAST:  49mL OMNIPAQUE IOHEXOL 350 MG/ML SOLN COMPARISON:  CT chest 03/23/2019 FINDINGS: CTA CHEST FINDINGS  Cardiovascular: Satisfactory opacification of the pulmonary arteries to the segmental level. Pulmonary artery filling defects within the left lower lobe segmental and subsegmental pulmonary arteries, right upper lobe segmental and subsegmental pulmonary arteries, right lower lobe subsegmental pulmonary arteries, right middle lobe segmental and subsegmental pulmonary arteries. Some of these appear central and cords like but are not visualized on the prior CT angiography chest 01/08/2021. Normal heart size. No pericardial effusion. Atherosclerotic plaque of the thoracic aorta. Mediastinum/Nodes: No enlarged mediastinal, hilar, or axillary lymph nodes. Thyroid gland and trachea demonstrate no significant findings. At least small to moderate hiatal hernia. Poor visualization of the esophagus due to timing of contrast. Lungs/Pleura: Hazy airspace opacity peripherally within the right upper lobe (6:45). Likely subsegmental atelectasis at the left lower lobe. Similar-appearing 2.2 x 2.3 cm partially calcified pulmonary nodule (interval decrease in size from PET-CT 06/21/2018). Calcified pulmonary nodule at the left apex (6:28). Left peribronchovascular calcifications likely representing old granulomatous disease (6:40, 57). Associated traction bronchiectasis and architectural distortion. No pulmonary mass. No pleural effusion. No pneumothorax. Musculoskeletal: No chest wall abnormality. No suspicious lytic or blastic osseous lesions. Old healed right rib fractures. No acute displaced fracture. Similar-appearing compression fractures of the T9 and T11 vertebral bodies. Multilevel degenerative changes of the spine. Degenerative changes of the visualized right hand. Review of the MIP images confirms the above findings. CT ABDOMEN and PELVIS FINDINGS Hepatobiliary: No focal liver abnormality. Status post cholecystectomy. No biliary dilatation. Pancreas: No focal lesion. Normal pancreatic contour. No surrounding inflammatory  changes. No main pancreatic ductal dilatation. Spleen: Normal in size without focal abnormality. Adrenals/Urinary Tract: No adrenal nodule bilaterally. Bilateral kidneys enhance symmetrically. No hydronephrosis. No hydroureter. The urinary bladder is distended with urine.  Stomach/Bowel: Stomach is within normal limits. No evidence of bowel wall thickening or dilatation. Stool throughout the colon. The appendix not definitely identified. Vascular/Lymphatic: No abdominal aorta or iliac aneurysm. Moderate to severe calcified and noncalcified atherosclerotic plaque of the aorta and its branches. No abdominal, pelvic, or inguinal lymphadenopathy. Reproductive: Uterus and bilateral adnexa are unremarkable. Other: No intraperitoneal free fluid. No intraperitoneal free gas. No organized fluid collection. Musculoskeletal: Tiny fat containing umbilical hernia. No suspicious lytic or blastic osseous lesions. No acute displaced fracture. Multilevel degenerative changes of the spine. Review of the MIP images confirms the above findings. IMPRESSION: 1. All three right lobe and left lower lobe segmental and subsegmental pulmonary emboli. No findings of right heart strain. Possible developing pulmonary infarction in the right upper lobe. 2. At least small to moderate hiatal hernia. Poor visualization of the esophagus due to timing of contrast. 3. Similar-appearing 2.2 x 2.3 cm left apex lobe partially calcified pulmonary nodule (interval decrease in size from PET-CT 06/21/2018). 4. Constipation and urinary bladder distended with urine with otherwise no acute intra-abdominal or intrapelvic abnormality. 5. Other imaging findings of potential clinical significance: Sequela of prior granulomatous disease of the left lung. Chronic T9 and T11 compression fractures. Aortic Atherosclerosis (ICD10-I70.0). These results were called by telephone at the time of interpretation on 02/07/2021 at 5:43 pm to provider [facility] , who verbally  acknowledged these results. Electronically Signed   By: Iven Finn M.D.   On: 02/07/2021 18:01   CT ABDOMEN PELVIS W CONTRAST  Result Date: 02/07/2021 CLINICAL DATA:  wheezes throughout, whenever pt taken off any kind of O2, desaturates to 83%. Pt tachypnic. Abdominal distension EXAM: CT ANGIOGRAPHY CHEST CT ABDOMEN AND PELVIS WITH CONTRAST TECHNIQUE: Multidetector CT imaging of the chest was performed using the standard protocol during bolus administration of intravenous contrast. Multiplanar CT image reconstructions and MIPs were obtained to evaluate the vascular anatomy. Multidetector CT imaging of the abdomen and pelvis was performed using the standard protocol during bolus administration of intravenous contrast. CONTRAST:  46mL OMNIPAQUE IOHEXOL 350 MG/ML SOLN COMPARISON:  CT chest 03/23/2019 FINDINGS: CTA CHEST FINDINGS Cardiovascular: Satisfactory opacification of the pulmonary arteries to the segmental level. Pulmonary artery filling defects within the left lower lobe segmental and subsegmental pulmonary arteries, right upper lobe segmental and subsegmental pulmonary arteries, right lower lobe subsegmental pulmonary arteries, right middle lobe segmental and subsegmental pulmonary arteries. Some of these appear central and cords like but are not visualized on the prior CT angiography chest 01/08/2021. Normal heart size. No pericardial effusion. Atherosclerotic plaque of the thoracic aorta. Mediastinum/Nodes: No enlarged mediastinal, hilar, or axillary lymph nodes. Thyroid gland and trachea demonstrate no significant findings. At least small to moderate hiatal hernia. Poor visualization of the esophagus due to timing of contrast. Lungs/Pleura: Hazy airspace opacity peripherally within the right upper lobe (6:45). Likely subsegmental atelectasis at the left lower lobe. Similar-appearing 2.2 x 2.3 cm partially calcified pulmonary nodule (interval decrease in size from PET-CT 06/21/2018). Calcified  pulmonary nodule at the left apex (6:28). Left peribronchovascular calcifications likely representing old granulomatous disease (6:40, 57). Associated traction bronchiectasis and architectural distortion. No pulmonary mass. No pleural effusion. No pneumothorax. Musculoskeletal: No chest wall abnormality. No suspicious lytic or blastic osseous lesions. Old healed right rib fractures. No acute displaced fracture. Similar-appearing compression fractures of the T9 and T11 vertebral bodies. Multilevel degenerative changes of the spine. Degenerative changes of the visualized right hand. Review of the MIP images confirms the above findings. CT ABDOMEN and PELVIS  FINDINGS Hepatobiliary: No focal liver abnormality. Status post cholecystectomy. No biliary dilatation. Pancreas: No focal lesion. Normal pancreatic contour. No surrounding inflammatory changes. No main pancreatic ductal dilatation. Spleen: Normal in size without focal abnormality. Adrenals/Urinary Tract: No adrenal nodule bilaterally. Bilateral kidneys enhance symmetrically. No hydronephrosis. No hydroureter. The urinary bladder is distended with urine. Stomach/Bowel: Stomach is within normal limits. No evidence of bowel wall thickening or dilatation. Stool throughout the colon. The appendix not definitely identified. Vascular/Lymphatic: No abdominal aorta or iliac aneurysm. Moderate to severe calcified and noncalcified atherosclerotic plaque of the aorta and its branches. No abdominal, pelvic, or inguinal lymphadenopathy. Reproductive: Uterus and bilateral adnexa are unremarkable. Other: No intraperitoneal free fluid. No intraperitoneal free gas. No organized fluid collection. Musculoskeletal: Tiny fat containing umbilical hernia. No suspicious lytic or blastic osseous lesions. No acute displaced fracture. Multilevel degenerative changes of the spine. Review of the MIP images confirms the above findings. IMPRESSION: 1. All three right lobe and left lower lobe  segmental and subsegmental pulmonary emboli. No findings of right heart strain. Possible developing pulmonary infarction in the right upper lobe. 2. At least small to moderate hiatal hernia. Poor visualization of the esophagus due to timing of contrast. 3. Similar-appearing 2.2 x 2.3 cm left apex lobe partially calcified pulmonary nodule (interval decrease in size from PET-CT 06/21/2018). 4. Constipation and urinary bladder distended with urine with otherwise no acute intra-abdominal or intrapelvic abnormality. 5. Other imaging findings of potential clinical significance: Sequela of prior granulomatous disease of the left lung. Chronic T9 and T11 compression fractures. Aortic Atherosclerosis (ICD10-I70.0). These results were called by telephone at the time of interpretation on 02/07/2021 at 5:43 pm to provider [facility] , who verbally acknowledged these results. Electronically Signed   By: Iven Finn M.D.   On: 02/07/2021 18:01     EKG: Independently reviewed, with result as described above.    Assessment/Plan   Leia Coletti is a 80 y.o. female with medical history significant for chronic hypoxic respiratory failure on baseline continuous 2 L [place] in the setting of lung cancer, breast cancer, urinary incontinence, acquired hypothyroidism, Who is admitted to [facility] on 02/07/2021 with acute bilateral pulmonary emboli after presenting from SNF to [facility] ED for further evaluation of shortness of breath.    Principal Problem:   Acute pulmonary embolism (HCC) Active Problems:   Gastroesophageal reflux disease   Hypocalcemia   Elevated troponin   Constipation   Acute urinary retention   Prolonged QT interval   Allergic rhinitis   Acquired hypothyroidism     #) Acute Bilateral Pulmonary Emboli: In the context of presenting shortness of breath, increased work of breathing, CTA chest demonstrates bilateral segmental/subsegmental pulmonary emboli in the  absence of evidence of saddle central emboli, as further detailed above. No clinical or radiographic evidence of associated cor pulmonale. No evidence of acute hypoxia relative to her history of chronic hypoxic respiratory failure on baseline continuous 2 L nasal cannula. No evidence of associated hypotension to warrant consideration for TPA administration. Appears to be patient's first PE/DVT.  Most prominent provoking factor appears to be hypercoagulability associated with multiple underlying malignancies lung/breast cancer, as further detailed above.  Not on any blood thinners as an outpatient, including no aspirin. For this evening, will continue Heparin drip initiated in the ED. Will refrain from pursuing venous ultrasounds of the bilateral lower extremities as there is limited utility of this pursuit given that half of all pulmonary emboli occur in the absence  of overtly identifiable DVTs.  Presenting EKG shows sinus rhythm without evidence of acute ischemic changes. mildly elevated troponin appears consistent with finding of acute PE, and ACS is felt to be less likely at this time.  Presentation has not been associate with any overt chest pain and EKG demonstrates no evidence of acute ischemic changes as above.        Plan: Continue Heparin drip overnight. Monitor on telemetry. Monitor for development of hypotension.  Prn albuterol nebulizers.  Gentle continuous IV fluids overnight in the form of lactated Ringer's at 50 cc/h x 12 hours in order to attend any preload dependent element of this presentation.  Monitor continuous pulse ox. Check INR. Recheck CBC in the morning.  In the setting of mildly elevated troponin, will check echocardiogram in the morning, as further detailed below.       #) Elevated troponin: mildly elevated initial troponin of 85, relative to most recent prior value of 18 on 01/08/2021. Suspect that this mildly elevated troponin is on the basis of supply demand mismatch in the  setting of the above acute bilateral pulmonary emboli , as opposed to representing a type I process due to acute plaque rupture.  EKG shows no evidence of acute ischemic changes, including no evidence of STEMI, and CXR showed no acute CP process, including no evidence of pneumothorax.  Aside from bilateral acute pulmonary emboli CTA chest shows no evidence of acute pulmonary process, including no evidence of edema or pleural effusion.  Additionally, presentation is not associated with any CP.  Overall, ACS is felt to be less likely relative to type 2 supply demand mismatch, as above, but will closely monitor on telemetry overnight while treating suspected underlying acute bilateral pulmonary emboli, as further described above, including a heparin drip, with echo in the AM.  Of note, most recent echocardiogram occurred in May 2020, as further detailed above.    Plan: Continue to trend serial troponin overnight. Monitor on telemetry. Check serum Mg level and repeat BMP in the morning. Repeat CBC in the AM. Additional evaluation and management of presenting acute bilateral pulmonary emboli as suspected driving force behind mildly elevated troponin, as above, continuation of heparin drip.  Echocardiogram ordered for the morning, with attention to evidence of right-sided heart strain as well as evaluation for interval focal elevation of bodies relative to most recent prior echocardiogram in May 2020.      #) Constipation: In the setting of documented history of such with patient on scheduled MiraLAX 17 g p.o. twice daily as an outpatient, today's imaging demonstrates evidence of constipation since of bowel obstruction or perforation.  Will escalate to existing bowel regimen noted to aforementioned laxative to include medications from both stimulant as well as stool softener classes, as indicated below.   Plan: Continue outpatient scheduled MiraLAX and add scheduled Senokot twice daily.  Check TSH in the  setting of a documented history of acquired hypothyroidism on Synthroid supplementation.      #) Urinary retention: Presenting imaging demonstrates urinary bladder distention with the radiographic appearance of intra bladder urine.  Likely multifactorial in nature with suspected acute contribution acute on chronic constipation, as further detailed above.  Additionally, there is likely a pharmacologic contribution from Myrbetriq as component of management of a documented history of urinary incontinence, resulting further exacerbation of urinary stasis as a consequence of constriction of urinary bladder neck.   Plan: Further evaluation management of acute on chronic constipation, as further detailed above.  Gentle overnight IV  fluids, as described above.  Hold outpatient Myrbetriq for now.  Monitor strict I's and O's and daily weights.  Repeat BMP in the morning. Consider checking PVR.        #) Hypocalcemia: Presenting labs reflect low serum calcium level, even after correction to 7.8 when taking into account concomitant hypoalbuminemia.  She has received calcium gluconate 1 g IV the over 1 hour in the ED this evening.  However, in the setting of degree of presenting hypercalcemia concomitant hypermagnesemia with presenting serum magnesium level 2.7, will administer another 1 g above IV calcium gluconate over 1 hour now.  Of note, the patient is not on a calcium carbonate as an outpatient, but is on vitamin D supplementation.  Plan: Calcium gluconate 1 g IV for 1 hour x 1 additional dose now, as above.  Repeat CMP and serum magnesium level in the morning.  Check 25-hydroxy vitamin D level.      #) QTC prolongation: Presenting EKG reflects QTC of 505 ms.  Outpatient medication regimen notable for Effexor.   Plan: Hold Effexor for now.  Monitor on telemetry.  Repeat EKG has been ordered for tomorrow morning.  Repeat serum magnesium level in the morning.      #) GERD: On omeprazole as an  outpatient.  Plan: Continue outpatient PPI.      #) Allergic rhinitis: Documented history of such in the setting of seasonal allergies.  On scheduled intranasal Flonase as an outpatient.  Plan: Continue home scheduled intranasal corticosteroid.      #) Acquired hypothyroidism: Documented history of such, on Synthroid as an outpatient.  In the setting of acute on chronic constipation, will check TSH to evaluate adequacy of current degree of thyroid supplementation.  Plan: Check TSH, as above.  Continue outpatient dose of Synthroid for now.      DVT prophylaxis: Heparin drip Code Status: DNR (including per hardcopy DNR documentation present at bedside).  Family Communication: none Disposition Plan: Per Rounding Team Consults called: none;  Admission status: Inpatient; PCU     Of note, this patient was added by me to the following Admit List/Treatment Team: armcadmits.    Of note, the Adult Admission Order Set (Multimorbid order set) was used by me in the admission process for this patient.   PLEASE NOTE THAT DRAGON DICTATION SOFTWARE WAS USED IN THE CONSTRUCTION OF THIS NOTE.   [place] [facility] Pager [phone] From [place]  Otherwise, please contact night-coverage  [email or web address] Password [email or web address]   02/07/2021, 7:00 PM

## 2021-02-07 NOTE — ED Notes (Signed)
Pt resting, arrousable. VSS.

## 2021-02-08 ENCOUNTER — Inpatient Hospital Stay (HOSPITAL_COMMUNITY)
Admit: 2021-02-08 | Discharge: 2021-02-08 | Disposition: A | Discharge status: Home / Self Care | Payer: Medicare Other | Attending: Internal Medicine | Admitting: Internal Medicine

## 2021-02-08 ENCOUNTER — Other Ambulatory Visit: Payer: Self-pay

## 2021-02-08 ENCOUNTER — Inpatient Hospital Stay: Payer: Medicare Other

## 2021-02-08 DIAGNOSIS — K5900 Constipation, unspecified: Secondary | ICD-10-CM | POA: Diagnosis present

## 2021-02-08 DIAGNOSIS — I824Y2 Acute embolism and thrombosis of unspecified deep veins of left proximal lower extremity: Secondary | ICD-10-CM

## 2021-02-08 DIAGNOSIS — R0602 Shortness of breath: Secondary | ICD-10-CM | POA: Diagnosis not present

## 2021-02-08 DIAGNOSIS — R778 Other specified abnormalities of plasma proteins: Secondary | ICD-10-CM

## 2021-02-08 DIAGNOSIS — R9431 Abnormal electrocardiogram [ECG] [EKG]: Secondary | ICD-10-CM | POA: Diagnosis present

## 2021-02-08 DIAGNOSIS — R7989 Other specified abnormal findings of blood chemistry: Secondary | ICD-10-CM | POA: Diagnosis present

## 2021-02-08 DIAGNOSIS — D638 Anemia in other chronic diseases classified elsewhere: Secondary | ICD-10-CM

## 2021-02-08 DIAGNOSIS — R338 Other retention of urine: Secondary | ICD-10-CM | POA: Diagnosis not present

## 2021-02-08 DIAGNOSIS — C50919 Malignant neoplasm of unspecified site of unspecified female breast: Secondary | ICD-10-CM

## 2021-02-08 DIAGNOSIS — I2699 Other pulmonary embolism without acute cor pulmonale: Secondary | ICD-10-CM

## 2021-02-08 DIAGNOSIS — N179 Acute kidney failure, unspecified: Secondary | ICD-10-CM

## 2021-02-08 DIAGNOSIS — C3490 Malignant neoplasm of unspecified part of unspecified bronchus or lung: Secondary | ICD-10-CM

## 2021-02-08 DIAGNOSIS — E039 Hypothyroidism, unspecified: Secondary | ICD-10-CM | POA: Diagnosis not present

## 2021-02-08 DIAGNOSIS — R600 Localized edema: Secondary | ICD-10-CM

## 2021-02-08 DIAGNOSIS — J309 Allergic rhinitis, unspecified: Secondary | ICD-10-CM | POA: Diagnosis present

## 2021-02-08 DIAGNOSIS — I959 Hypotension, unspecified: Secondary | ICD-10-CM

## 2021-02-08 LAB — COMPREHENSIVE METABOLIC PANEL
ALT: 34 U/L (ref 0–44)
AST: 36 U/L (ref 15–41)
Albumin: 1.9 g/dL — ABNORMAL LOW (ref 3.5–5.0)
Alkaline Phosphatase: 90 U/L (ref 38–126)
Anion gap: 10 (ref 5–15)
BUN: 20 mg/dL (ref 8–23)
CO2: 26 mmol/L (ref 22–32)
Calcium: 6.4 mg/dL — CL (ref 8.9–10.3)
Chloride: 98 mmol/L (ref 98–111)
Creatinine, Ser: 1.02 mg/dL — ABNORMAL HIGH (ref 0.44–1.00)
GFR, Estimated: 55 mL/min — ABNORMAL LOW (ref >60)
Glucose, Bld: 215 mg/dL — ABNORMAL HIGH (ref 70–99)
Potassium: 3.7 mmol/L (ref 3.5–5.1)
Sodium: 134 mmol/L — ABNORMAL LOW (ref 135–145)
Total Bilirubin: 0.8 mg/dL (ref 0.3–1.2)
Total Protein: 4.8 g/dL — ABNORMAL LOW (ref 6.5–8.1)

## 2021-02-08 LAB — ECHOCARDIOGRAM COMPLETE
AR max vel: 2.32 cm2
AV Area VTI: 2.13 cm2
AV Area mean vel: 2.11 cm2
AV Mean grad: 10.3 mmHg
AV Peak grad: 18.7 mmHg
Ao pk vel: 2.16 m/s
Area-P 1/2: 2.73 cm2
Height: 62 in
MV VTI: 2.24 cm2
S' Lateral: 2.5 cm
Weight: 2192 oz

## 2021-02-08 LAB — CBC
HCT: 26.4 % — ABNORMAL LOW (ref 36.0–46.0)
Hemoglobin: 9.1 g/dL — ABNORMAL LOW (ref 12.0–15.0)
MCH: 31.9 pg (ref 26.0–34.0)
MCHC: 34.5 g/dL (ref 30.0–36.0)
MCV: 92.6 fL (ref 80.0–100.0)
Platelets: 380 10*3/uL (ref 150–400)
RBC: 2.85 MIL/uL — ABNORMAL LOW (ref 3.87–5.11)
RDW: 15 % (ref 11.5–15.5)
WBC: 8.1 10*3/uL (ref 4.0–10.5)
nRBC: 0 % (ref 0.0–0.2)

## 2021-02-08 LAB — TROPONIN I (HIGH SENSITIVITY)
Troponin I (High Sensitivity): 130 ng/L (ref <18)
Troponin I (High Sensitivity): 139 ng/L (ref <18)

## 2021-02-08 LAB — HEPARIN LEVEL (UNFRACTIONATED)
Heparin Unfractionated: 0.78 IU/mL — ABNORMAL HIGH (ref 0.30–0.70)
Heparin Unfractionated: 0.56 IU/mL (ref 0.30–0.70)
Heparin Unfractionated: 0.59 IU/mL (ref 0.30–0.70)

## 2021-02-08 LAB — VITAMIN D 25 HYDROXY (VIT D DEFICIENCY, FRACTURES): Vit D, 25-Hydroxy: 67.1 ng/mL (ref 30–100)

## 2021-02-08 LAB — PHOSPHORUS: Phosphorus: 3.1 mg/dL (ref 2.5–4.6)

## 2021-02-08 LAB — GLUCOSE, CAPILLARY
Glucose-Capillary: 172 mg/dL — ABNORMAL HIGH (ref 70–99)
Glucose-Capillary: 138 mg/dL — ABNORMAL HIGH (ref 70–99)

## 2021-02-08 LAB — MAGNESIUM: Magnesium: 2.8 mg/dL — ABNORMAL HIGH (ref 1.7–2.4)

## 2021-02-08 LAB — TSH: TSH: 1.607 u[IU]/mL (ref 0.350–4.500)

## 2021-02-08 MED ORDER — SENNOSIDES-DOCUSATE SODIUM 8.6-50 MG PO TABS: 1.0000 | ORAL_TABLET | Freq: Two times a day (BID) | ORAL | Status: DC | PRN

## 2021-02-08 MED ORDER — CALCIUM GLUCONATE-NACL 2-0.675 GM/100ML-% IV SOLN
2.0000 g | Freq: Once | INTRAVENOUS | Status: AC
  Administered 2021-02-08: 2000 mg via INTRAVENOUS
  Filled 2021-02-08: qty 100

## 2021-02-08 MED ORDER — MIDODRINE HCL 5 MG PO TABS
5.0000 mg | ORAL_TABLET | Freq: Three times a day (TID) | ORAL | Status: DC
  Administered 2021-02-08 – 2021-02-11 (×9): 5 mg via ORAL
  Filled 2021-02-08 (×9): qty 1

## 2021-02-08 MED ORDER — ALUM & MAG HYDROXIDE-SIMETH 200-200-20 MG/5ML PO SUSP
30.0000 mL | Freq: Three times a day (TID) | ORAL | Status: DC | PRN
  Administered 2021-02-09 – 2021-02-11 (×2): 30 mL via ORAL
  Filled 2021-02-08 (×2): qty 30

## 2021-02-08 MED ORDER — SODIUM CHLORIDE 0.9% FLUSH
10.0000 mL | Freq: Two times a day (BID) | INTRAVENOUS | Status: DC
Start: 2021-02-08 — End: 2021-02-11
  Administered 2021-02-08 – 2021-02-11 (×6): 10 mL via INTRAVENOUS

## 2021-02-08 MED ORDER — POLYETHYLENE GLYCOL 3350 17 G PO PACK: 17.0000 g | PACK | Freq: Two times a day (BID) | ORAL | Status: DC | PRN

## 2021-02-08 NOTE — TOC CM/SW Note (Signed)
RE: Jamie Matthews Date of Birth: Sep 29, 1940 Date: 02/08/2021   To Whom It May Concern:  Please be advised that the above-named patient will require a short-term nursing home stay - anticipated 30 days or less for rehabilitation and strengthening.  The plan is for return home.

## 2021-02-08 NOTE — Progress Notes (Addendum)
In/out cathed patient per protocol,  received 1900 ml straw colored clear urine.  Patient tolerated well

## 2021-02-08 NOTE — Progress Notes (Addendum)
Bladder scan  1751 ml Patient unable to void.  Randol Kern, NP notified Orders received for In/Out Cath

## 2021-02-08 NOTE — Progress Notes (Signed)
Date and time results received: 02/08/21 0500  Test: Calcium  Critical Value:  6.4  Name of Provider Notified: Randol Kern, NP

## 2021-02-08 NOTE — Progress Notes (Signed)
[facility] for heparin Indication: pulmonary embolus  Allergies  Allergen Reactions   Sulfa Antibiotics Rash    Patient Measurements: Height: 5\' 2"  (157.5 cm) Weight: 62.1 kg (137 lb) IBW/kg (Calculated) : 50.1 Heparin Dosing Weight: 62 kg  Vital Signs: Temp: 97.8 F (36.6 C) (09/16 0310) Temp Source: Axillary (09/16 0310) BP: 105/47 (09/16 0310) Pulse Rate: 56 (09/16 0310)  Labs: Recent Labs    02/05/21 1328 02/07/21 1449 02/07/21 2334 02/08/21 0332  HGB  --  9.7*  --  9.1*  HCT  --  28.6*  --  26.4*  PLT  --  415*  --  380  APTT  --  31  --   --   LABPROT  --  14.6  --   --   INR  --  1.1  --   --   HEPARINUNFRC  --   --   --  0.78*  CREATININE 1.08* 1.30*  --   --   TROPONINIHS  --  85* 130*  --      Estimated Creatinine Clearance: 28.9 mL/min (A) (by C-G formula based on SCr of 1.3 mg/dL (H)).   Medical History: Past Medical History:  Diagnosis Date   AKI (acute kidney injury) ([place]) 08/17/2018   Anxiety    Arthritis    Belching    Bladder disorder    OVERACTIVE   Bowel dysfunction    BLOCKAGE   Cancer (HCC)    breast   Constipation    Depression    Diverticulitis    Fibromyalgia    GERD (gastroesophageal reflux disease)    Hyperlipidemia    IBS (irritable bowel syndrome)    Internal hemorrhoids    Lung cancer ([place]) 2020   Memory deficits    Murmur    asymptomatic   Pneumonia 11/18/12   Urinary incontinence    Vertigo      Assessment: 80 year old female presented with SOB. CTA positive all three right lobe and left lower lobe segmental and subsegmental pulmonary emboli. No anticoagulation note PTA. Pharmacy consult for heparin management.  Date Time HL Rate/Comment  09/16 0332 0.78 Supratherapeutic 950 >850 units/hr  Goal of Therapy:  Heparin level 0.3-0.7 units/ml Monitor platelets by anticoagulation protocol: Yes   Plan:  Heparin supratherapeutic  Decrease Heparin infusion to 850  units/hr Check HL 8 hours following rate change CBC daily while on heparin  Dorothe Pea, PharmD, BCPS Clinical Pharmacist 02/08/2021 4:00 AM

## 2021-02-08 NOTE — NC FL2 (Signed)
[place] LEVEL OF CARE SCREENING TOOL     IDENTIFICATION  Patient Name: Jamie Matthews Birthdate: Jan 14, 1941 Sex: female Admission Date (Current Location): 02/07/2021  [facility] [place] Number:  Engineering geologist and Address:  [facility], [address], [place], [place] [address]      Provider Number: [id number]  Attending Physician Name and Address:  Mercy Riding, MD  Relative Name and Phone Number:       Current Level of Care: Hospital Recommended Level of Care: [place] Prior Approval Number:    Date Approved/Denied:   PASRR Number: Manual review  Discharge Plan: SNF    Current Diagnoses: Patient Active Problem List   Diagnosis Date Noted   Elevated troponin 02/08/2021   Constipation 02/08/2021   Acute urinary retention 02/08/2021   Prolonged QT interval 02/08/2021   Allergic rhinitis 02/08/2021   Acquired hypothyroidism 02/08/2021   Acute pulmonary embolism ([place]) 02/07/2021   Acute respiratory failure with hypoxia ([place]) 02/05/2021   Shortness of breath 01/08/2021   Aortic valve stenosis 01/19/2020   Left hip pain 12/05/2019   Hypocalcemia 11/02/2019   Trochanteric bursitis 09/20/2019   Pruritus 09/06/2019   Overactive bladder 09/06/2019   Chronic pain of right knee 09/06/2019   Multiple rib fractures 07/15/2019   Fall as cause of accidental injury at home as place of occurrence 07/15/2019   Encounter for antineoplastic chemotherapy 12/17/2018   Lower leg edema 12/17/2018   Aromatase inhibitor use 10/24/2018   Osteopenia 10/24/2018   Fever 10/10/2018   Closed displaced fracture of pubis ([place]) 09/20/2018   Localized swelling of right lower leg 09/20/2018   AKI (acute kidney injury) ([place]) 08/17/2018   Myofascial pain 07/28/2018   Primary adenocarcinoma of lung ([place]) 07/21/2018   Malignant neoplasm of left female breast ([place]) 07/21/2018   Adenopathy    Goals of care,  counseling/discussion 06/26/2018   IBS (irritable bowel syndrome) 05/25/2018   BPV (benign positional vertigo) 07/30/2017   Unilateral primary osteoarthritis, left knee 06/24/2017   Bunion of great toe of right foot 06/09/2017   Dizziness 03/24/2017   Decreased appetite 12/03/2016   Fatigue 09/22/2016   Insomnia 09/22/2016   Depression, recurrent ([place]) 09/22/2016   Chronic cough 08/21/2016   Primary localized osteoarthrosis, hand 03/19/2016   OA (osteoarthritis) of knee 03/17/2016   Stress due to illness of family member 10/10/2014   Ileitis 06/22/2014   Chronic insomnia 09/20/2013   DNR (do not resuscitate) 11/15/2012   Other constipation 04/01/2012   Memory loss 02/05/2012   Urinary incontinence    Major depressive disorder, recurrent, moderate    Gastroesophageal reflux disease    Hyperlipidemia     Orientation RESPIRATION BLADDER Height & Weight     Self, Time, Situation, Place  O2 (Nasal Canula 3 L) Continent, External catheter Weight: 137 lb (62.1 kg) Height:  5\' 2"  (157.5 cm)  BEHAVIORAL SYMPTOMS/MOOD NEUROLOGICAL BOWEL NUTRITION STATUS   (None)  (None) Continent Diet (Regular)  AMBULATORY STATUS COMMUNICATION OF NEEDS Skin   Extensive Assist Verbally Normal                       Personal Care Assistance Level of Assistance  Bathing, Feeding, Dressing Bathing Assistance: Maximum assistance Feeding assistance: Limited assistance Dressing Assistance: Maximum assistance     Functional Limitations Info  Sight, Hearing, Speech Sight Info: Adequate Hearing Info: Adequate Speech Info: Adequate    SPECIAL CARE FACTORS FREQUENCY  PT (  By licensed PT), OT (By licensed OT)     PT Frequency: 5 x week OT Frequency: 5 x week            Contractures Contractures Info: Not present    Additional Factors Info  Code Status, Allergies Code Status Info: DNR Allergies Info: Sulfa Antibiotics           Current Medications (02/08/2021):  This is the current  hospital active medication list Current Facility-Administered Medications  Medication Dose Route Frequency Provider Last Rate Last Admin   acetaminophen (TYLENOL) tablet 650 mg  650 mg Oral Q6H PRN Howerter, Justin B, DO       Or   acetaminophen (TYLENOL) suppository 650 mg  650 mg Rectal Q6H PRN Howerter, Justin B, DO       albuterol (PROVENTIL) (2.5 MG/3ML) 0.083% nebulizer solution 2.5 mg  2.5 mg Nebulization Q6H PRN Howerter, Justin B, DO       anastrozole (ARIMIDEX) tablet 1 mg  1 mg Oral Daily Howerter, Justin B, DO   1 mg at 02/08/21 8003   benzonatate (TESSALON) capsule 100 mg  100 mg Oral TID PRN Howerter, Justin B, DO       calcium gluconate 2 g/ 100 mL sodium chloride IVPB  2 g Intravenous Once Mercy Riding, MD       crizotinib Hulda Humphrey) capsule 250 mg  250 mg Oral BID Howerter, Justin B, DO       fluticasone (FLONASE) 50 MCG/ACT nasal spray 2 spray  2 spray Each Nare Daily Howerter, Justin B, DO   2 spray at 02/08/21 0830   heparin ADULT infusion 100 units/mL (25000 units/244mL)  850 Units/hr Intravenous Continuous Dorothe Pea, RPH 8.5 mL/hr at 02/08/21 0422 850 Units/hr at 02/08/21 0422   levothyroxine (SYNTHROID) tablet 25 mcg  25 mcg Oral Daily Howerter, Justin B, DO   25 mcg at 02/08/21 0829   lubiprostone (AMITIZA) capsule 24 mcg  24 mcg Oral BID WC Howerter, Justin B, DO   24 mcg at 02/08/21 0829   pantoprazole (PROTONIX) EC tablet 40 mg  40 mg Oral Daily Howerter, Justin B, DO   40 mg at 02/08/21 4917   polyethylene glycol (MIRALAX / GLYCOLAX) packet 17 g  17 g Oral Once Howerter, Justin B, DO       polyethylene glycol (MIRALAX / GLYCOLAX) packet 17 g  17 g Oral BID Howerter, Justin B, DO       senna-docusate (Senokot-S) tablet 1 tablet  1 tablet Oral BID Howerter, Justin B, DO         Discharge Medications: Please see discharge summary for a list of discharge medications.  Relevant Imaging Results:  Relevant Lab Results:   Additional Information SS#:  [id number]  Candie Chroman, LCSW

## 2021-02-08 NOTE — Progress Notes (Signed)
Date and time results received: 02/08/21 1215    Test: Troponin   Critical Value: 130   Name of Provider Notified: Babs Bertin, DO  Orders Received.  Will continue Heparin gtt as ordered.

## 2021-02-08 NOTE — Progress Notes (Signed)
PT Cancellation Note  Patient Details Name: Jamie Matthews MRN: [id number] DOB: [birth year]   Cancelled Treatment:    Reason Eval/Treat Not Completed: Medical issues which prohibited therapy. Per chart review, patient noted with acute PE during this admission.  Per guidelines, patient to receive anticoagulation x48 hours prior to initiation of exertional activity; anticoagulation initiated at 18:25 on 9/15.  Will hold patient at this time and continue to follow, initiating Physical Therapy evaluation as medically appropriate. Pt also noted to have critically low calcium as well.  Lieutenant Diego PT, DPT 9:43 AM,02/08/21

## 2021-02-08 NOTE — Progress Notes (Signed)
*  PRELIMINARY RESULTS* Echocardiogram 2D Echocardiogram has been performed.  Jamie Matthews 02/08/2021, 1:23 PM

## 2021-02-08 NOTE — Progress Notes (Addendum)
PROGRESS NOTE  Jamie Matthews MCN:[id number] DOB: 08-Oct-1940   PCP: Venia Carbon, MD  Patient is from: SNF  DOA: 02/07/2021 LOS: 1  Chief complaints:  Shortness of breath    Brief Narrative / Interim history: 80 year old F with history of lung cancer, breast cancer, chronic respiratory failure on 2 L, hypothyroidism, incontinence and anemia of chronic disease presenting with progressive shortness of breath, wheeze and work of breathing for 2 days, and admitted for acute pulmonary embolism, AKI, acute urinary retention and constipation.  CTA chest showed bilateral segmental and subsegmental PE in right lung and LLL without evidence of right heart strain.  She was a started on IV heparin.  TTE ordered.  Patient had about 1.9 L UOP with I&O cath.  Foley catheter inserted on 9/16.  Constipation resolved with bowel regimen.  Subjective: Seen and examined earlier this morning.  No major events overnight of this morning.  No complaints.  She denies chest pain.  Breathing has improved.  Having bowel movements.  She reports bilateral lower extremity swelling.   Objective: Vitals:   02/08/21 0056 02/08/21 0310 02/08/21 0742 02/08/21 1232  BP: (!) 102/43 (!) 105/47 (!) 114/45 (!) 84/69  Pulse: (!) 59 (!) 56 (!) 58 63  Resp: 18 15 18 20   Temp: 97.7 F (36.5 C) 97.8 F (36.6 C) 97.9 F (36.6 C) 98.6 F (37 C)  TempSrc:  Axillary  Oral  SpO2: 90% 96% 92% 94%  Weight:      Height:        Intake/Output Summary (Last 24 hours) at 02/08/2021 1314 Last data filed at 02/08/2021 0600 Gross per 24 hour  Intake 627.63 ml  Output 1900 ml  Net -1272.37 ml   Filed Weights   02/07/21 1436  Weight: 62.1 kg    Examination:  GENERAL: No apparent distress.  Nontoxic. HEENT: MMM.  Vision and hearing grossly intact.  NECK: Supple.  No apparent JVD.  RESP:  No IWOB.  Fair aeration bilaterally. CVS:  RRR. Heart sounds normal.  ABD/GI/GU: BS+. Abd soft, NTND.  MSK/EXT:  Moves  extremities. No apparent deformity.  2+ pitting edema bilaterally. SKIN: no apparent skin lesion or wound NEURO: Awake, alert and oriented appropriately.  No apparent focal neuro deficit. PSYCH: Calm. Normal affect.   Procedures:  None  Microbiology summarized: [id number] and influenza PCR nonreactive.  Assessment & Plan: Acute bilateral segmental and subsegmental PE-no radiologic evidence of right heart strain but mildly elevated BNP and troponin.  Slightly hypotensive but stable.  She is coagulopathic given her age and malignancy. -Continue IV heparin pending TTE -Lower extremity venous US to exclude DVT -We will eventually transition to [place] unless patient's oncologist has different recommendation  Hypotension-persistently soft diastolic BP in 46T and 03T.  Not on antihypertensive meds.  TSH within normal. -Follow TTE -Check a.m. cortisol -Continue holding Lasix -Start low-dose midodrine -Check orthostatic vitals  AKI: Due to hypotension?  No hydronephrosis or hydroureter on CT but a distended bladder.  She had about 1.9 L UOP on I&O cath. Recent Labs    09/04/20 1317 10/15/20 1334 11/27/20 1442 12/28/20 1445 01/08/21 0837 01/09/21 0507 01/16/21 1342 02/05/21 1328 02/07/21 1449 02/08/21 0332  BUN 22 27* 23 24* 29* 26* 31* 24* 22 20  CREATININE 0.94 1.16* 1.21* 1.42* 1.23* 1.14* 0.93 1.08* 1.30* 1.02*  -Indwelling Foley catheter for 7 to 10 days for bladder rest -Continue holding Lasix -Monitor renal function  Chronic respiratory failure with hypoxia: On 2 L at  baseline. -Continue home supplemental oxygen  History of lung cancer -Continue home Crizotinib   -We will notify patient's oncologist  History of breast cancer -Continue home Arimidex -Will notify patient's oncologist  Acute urinary retention with bladder over distention: She had 1.9 L UOP on I&O cath.  No hydronephrosis or hydroureter on CT abdomen and pelvis. -Indwelling Foley catheter for 7 to 10  days -Continue holding Myrbetriq  Bilateral lower extremity edema-likely due to malignancy and hypoalbuminemia.  Albumin 1.9.  Anemia is not severe enough to cause this much edema.  She recently completed prednisone course which could contribute -Check lower extremity US to exclude DVT -Encourage leg elevation -Blood pressure and renal function precludes trial of diuretics.  Constipation: Resolved. -Change MiraLAX and Senokot-S2 as needed  Elevated troponin-likely demand ischemia and delayed clearance.  No chest pain.  No acute ischemic finding on EKG. -Follow TTE  Hyponatremia: Na 134.  Stable.  SIADH? -Check a.m. cortisol  Hypocalcemia-due to malignancy?  Vitamin D 67. -IV calcium gluconate 2 g x 1 -Recheck in the morning  Anemia of chronic disease: Slight drop in Hgb likely from IV fluid. Recent Labs    08/07/20 1237 09/04/20 1317 10/15/20 1334 11/27/20 1442 12/28/20 1445 01/08/21 0837 01/09/21 0507 01/16/21 1342 02/07/21 1449 02/08/21 0332  HGB 11.3* 12.0 12.3 13.2 12.1 11.5* 10.8* 11.1* 9.7* 9.1*  -Check anemia panel -Discontinue IV fluid  Depression -Continue home Effexor   Body mass index is 25.06 kg/m.         DVT prophylaxis:  SCDs Start: 02/07/21 1858 Start subcu Lovenox  Code Status: DNR/DNI Family Communication: Updated patient's sister over the phone with patient's permission Level of care: Progressive Cardiac Status is: Inpatient  Remains inpatient appropriate because:Hemodynamically unstable, Persistent severe electrolyte disturbances, Ongoing diagnostic testing needed not appropriate for outpatient work up, Unsafe d/c plan, IV treatments appropriate due to intensity of illness or inability to take PO, and Inpatient level of care appropriate due to severity of illness  Dispo: The patient is from: SNF              Anticipated d/c is to: SNF              Patient currently is not medically stable to d/c.   Difficult to place patient  No       Consultants:  None   Sch Meds:  Scheduled Meds:  anastrozole  1 mg Oral Daily   crizotinib  250 mg Oral BID   fluticasone  2 spray Each Nare Daily   levothyroxine  25 mcg Oral Daily   lubiprostone  24 mcg Oral BID WC   pantoprazole  40 mg Oral Daily   Continuous Infusions:  calcium gluconate 2,000 mg (02/08/21 1309)   heparin 850 Units/hr (02/08/21 0422)   PRN Meds:.acetaminophen **OR** acetaminophen, albuterol, benzonatate, polyethylene glycol, senna-docusate  Antimicrobials: Anti-infectives (From admission, onward)    None        I have personally reviewed the following labs and images: CBC: Recent Labs  Lab 02/07/21 1449 02/08/21 0332  WBC 11.7* 8.1  HGB 9.7* 9.1*  HCT 28.6* 26.4*  MCV 93.2 92.6  PLT 415* 380   BMP &GFR Recent Labs  Lab 02/05/21 1328 02/07/21 1449 02/08/21 0332  NA 133* 134* 134*  K 3.9 3.6 3.7  CL 96* 96* 98  CO2 29 30 26   GLUCOSE 163* 182* 215*  BUN 24* 22 20  CREATININE 1.08* 1.30* 1.02*  CALCIUM 6.7* 6.2* 6.4*  MG  --  2.7* 2.8*  PHOS  --   --  3.1   Estimated Creatinine Clearance: 36.9 mL/min (A) (by C-G formula based on SCr of 1.02 mg/dL (H)). Liver & Pancreas: Recent Labs  Lab 02/07/21 1449 02/08/21 0332  AST 42* 36  ALT 37 34  ALKPHOS 95 90  BILITOT 0.6 0.8  PROT 5.2* 4.8*  ALBUMIN 2.0* 1.9*   No results for input(s): LIPASE, AMYLASE in the last 168 hours. No results for input(s): AMMONIA in the last 168 hours. Diabetic: No results for input(s): HGBA1C in the last 72 hours. Recent Labs  Lab 02/08/21 0743  GLUCAP 172*   Cardiac Enzymes: No results for input(s): CKTOTAL, CKMB, CKMBINDEX, TROPONINI in the last 168 hours. No results for input(s): PROBNP in the last 8760 hours. Coagulation Profile: Recent Labs  Lab 02/07/21 1449  INR 1.1   Thyroid Function Tests: Recent Labs    02/08/21 0332  TSH 1.607   Lipid Profile: No results for input(s): CHOL, HDL, LDLCALC, TRIG, CHOLHDL,  LDLDIRECT in the last 72 hours. Anemia Panel: No results for input(s): VITAMINB12, FOLATE, FERRITIN, TIBC, IRON, RETICCTPCT in the last 72 hours. Urine analysis:    Component Value Date/Time   COLORURINE YELLOW (A) 01/08/2021 1300   APPEARANCEUR HAZY (A) 01/08/2021 1300   APPEARANCEUR Clear 09/23/2019 1103   LABSPEC 1.021 01/08/2021 1300   LABSPEC 1.027 06/10/2014 1759   PHURINE 7.0 01/08/2021 1300   GLUCOSEU NEGATIVE 01/08/2021 1300   GLUCOSEU Negative 06/10/2014 1759   HGBUR NEGATIVE 01/08/2021 1300   BILIRUBINUR NEGATIVE 01/08/2021 1300   BILIRUBINUR Negative 09/23/2019 1103   BILIRUBINUR Negative 06/10/2014 1759   KETONESUR NEGATIVE 01/08/2021 1300   PROTEINUR NEGATIVE 01/08/2021 1300   UROBILINOGEN 0.2 08/20/2017 1134   NITRITE NEGATIVE 01/08/2021 1300   LEUKOCYTESUR NEGATIVE 01/08/2021 1300   LEUKOCYTESUR Trace 06/10/2014 1759   Sepsis Labs: Invalid input(s): PROCALCITONIN, [place]  Microbiology: Recent Results (from the past 240 hour(s))  Resp Panel by RT-PCR (Flu A&B, Covid) Nasopharyngeal Swab     Status: None   Collection Time: 02/07/21  3:57 PM   Specimen: Nasopharyngeal Swab; Nasopharyngeal(NP) swabs in vial transport medium  Result Value Ref Range Status   SARS Coronavirus 2 by RT PCR NEGATIVE NEGATIVE Final    Comment: (NOTE) SARS-CoV-2 target nucleic acids are NOT DETECTED.  The SARS-CoV-2 RNA is generally detectable in upper respiratory specimens during the acute phase of infection. The lowest concentration of SARS-CoV-2 viral copies this assay can detect is 138 copies/mL. A negative result does not preclude SARS-Cov-2 infection and should not be used as the sole basis for treatment or other patient management decisions. A negative result may occur with  improper specimen collection/handling, submission of specimen other than nasopharyngeal swab, presence of viral mutation(s) within the areas targeted by this assay, and inadequate number of  viral copies(<138 copies/mL). A negative result must be combined with clinical observations, patient history, and epidemiological information. The expected result is Negative.  Fact Sheet for Patients:  EntrepreneurPulse.com.au  Fact Sheet for Healthcare Providers:  IncredibleEmployment.be  This test is no t yet approved or cleared by the [place] FDA and  has been authorized for detection and/or diagnosis of SARS-CoV-2 by FDA under an Emergency Use Authorization (EUA). This EUA will remain  in effect (meaning this test can be used) for the duration of the COVID-19 declaration under Section 564(b)(1) of the Act, 21 U.S.C.section 360bbb-3(b)(1), unless the authorization is terminated  or revoked sooner.       Influenza  A by PCR NEGATIVE NEGATIVE Final   Influenza B by PCR NEGATIVE NEGATIVE Final    Comment: (NOTE) The Xpert Xpress SARS-CoV-2/FLU/RSV plus assay is intended as an aid in the diagnosis of influenza from Nasopharyngeal swab specimens and should not be used as a sole basis for treatment. Nasal washings and aspirates are unacceptable for Xpert Xpress SARS-CoV-2/FLU/RSV testing.  Fact Sheet for Patients: EntrepreneurPulse.com.au  Fact Sheet for Healthcare Providers: IncredibleEmployment.be  This test is not yet approved or cleared by the [place] FDA and has been authorized for detection and/or diagnosis of SARS-CoV-2 by FDA under an Emergency Use Authorization (EUA). This EUA will remain in effect (meaning this test can be used) for the duration of the COVID-19 declaration under Section 564(b)(1) of the Act, 21 U.S.C. section 360bbb-3(b)(1), unless the authorization is terminated or revoked.  Performed at [facility], [address], [place], [place] [address]     Radiology Studies: DG Chest 2 View  Result Date: 02/07/2021 CLINICAL DATA:  Shortness of breath EXAM:  CHEST - 2 VIEW COMPARISON:  Radiograph 01/08/2021, chest CT 01/08/2021 FINDINGS: Unchanged cardiomediastinal silhouette. There is no new focal airspace disease. Known faintly calcified nodule in the left apex is poorly visualized radiographically. Unchanged calcified more peripheral left apical calcified granuloma. There is no large pleural effusion or visible pneumothorax. Elevated right hemidiaphragm with colonic interposition. No acute osseous abnormality. Thoracic spondylosis. IMPRESSION: No new focal airspace disease. Electronically Signed   By: Maurine Simmering M.D.   On: 02/07/2021 16:01   CT HEAD WO CONTRAST (5MM)  Result Date: 02/07/2021 CLINICAL DATA:  Mental status change EXAM: CT HEAD WITHOUT CONTRAST TECHNIQUE: Contiguous axial images were obtained from the base of the skull through the vertex without intravenous contrast. COMPARISON:  MRI 10/24/2020, CT brain 10/31/2018 FINDINGS: Brain: No acute territorial infarction, hemorrhage or intracranial mass. Atrophy and chronic small vessel ischemic changes of the white matter. Stable ventricle size Vascular: No hyperdense vessels.  Carotid vascular calcification Skull: Normal. Negative for fracture or focal lesion. Sinuses/Orbits: No acute finding. Other: None IMPRESSION: 1. No CT evidence for acute intracranial abnormality. 2. Atrophy and chronic small vessel ischemic changes of the white matter Electronically Signed   By: Donavan Foil M.D.   On: 02/07/2021 17:29   CT Angio Chest PE W and/or Wo Contrast  Result Date: 02/07/2021 CLINICAL DATA:  wheezes throughout, whenever pt taken off any kind of O2, desaturates to 83%. Pt tachypnic. Abdominal distension EXAM: CT ANGIOGRAPHY CHEST CT ABDOMEN AND PELVIS WITH CONTRAST TECHNIQUE: Multidetector CT imaging of the chest was performed using the standard protocol during bolus administration of intravenous contrast. Multiplanar CT image reconstructions and MIPs were obtained to evaluate the vascular anatomy.  Multidetector CT imaging of the abdomen and pelvis was performed using the standard protocol during bolus administration of intravenous contrast. CONTRAST:  22mL OMNIPAQUE IOHEXOL 350 MG/ML SOLN COMPARISON:  CT chest 03/23/2019 FINDINGS: CTA CHEST FINDINGS Cardiovascular: Satisfactory opacification of the pulmonary arteries to the segmental level. Pulmonary artery filling defects within the left lower lobe segmental and subsegmental pulmonary arteries, right upper lobe segmental and subsegmental pulmonary arteries, right lower lobe subsegmental pulmonary arteries, right middle lobe segmental and subsegmental pulmonary arteries. Some of these appear central and cords like but are not visualized on the prior CT angiography chest 01/08/2021. Normal heart size. No pericardial effusion. Atherosclerotic plaque of the thoracic aorta. Mediastinum/Nodes: No enlarged mediastinal, hilar, or axillary lymph nodes. Thyroid gland and trachea demonstrate no significant findings. At least  small to moderate hiatal hernia. Poor visualization of the esophagus due to timing of contrast. Lungs/Pleura: Hazy airspace opacity peripherally within the right upper lobe (6:45). Likely subsegmental atelectasis at the left lower lobe. Similar-appearing 2.2 x 2.3 cm partially calcified pulmonary nodule (interval decrease in size from PET-CT 06/21/2018). Calcified pulmonary nodule at the left apex (6:28). Left peribronchovascular calcifications likely representing old granulomatous disease (6:40, 57). Associated traction bronchiectasis and architectural distortion. No pulmonary mass. No pleural effusion. No pneumothorax. Musculoskeletal: No chest wall abnormality. No suspicious lytic or blastic osseous lesions. Old healed right rib fractures. No acute displaced fracture. Similar-appearing compression fractures of the T9 and T11 vertebral bodies. Multilevel degenerative changes of the spine. Degenerative changes of the visualized right hand. Review  of the MIP images confirms the above findings. CT ABDOMEN and PELVIS FINDINGS Hepatobiliary: No focal liver abnormality. Status post cholecystectomy. No biliary dilatation. Pancreas: No focal lesion. Normal pancreatic contour. No surrounding inflammatory changes. No main pancreatic ductal dilatation. Spleen: Normal in size without focal abnormality. Adrenals/Urinary Tract: No adrenal nodule bilaterally. Bilateral kidneys enhance symmetrically. No hydronephrosis. No hydroureter. The urinary bladder is distended with urine. Stomach/Bowel: Stomach is within normal limits. No evidence of bowel wall thickening or dilatation. Stool throughout the colon. The appendix not definitely identified. Vascular/Lymphatic: No abdominal aorta or iliac aneurysm. Moderate to severe calcified and noncalcified atherosclerotic plaque of the aorta and its branches. No abdominal, pelvic, or inguinal lymphadenopathy. Reproductive: Uterus and bilateral adnexa are unremarkable. Other: No intraperitoneal free fluid. No intraperitoneal free gas. No organized fluid collection. Musculoskeletal: Tiny fat containing umbilical hernia. No suspicious lytic or blastic osseous lesions. No acute displaced fracture. Multilevel degenerative changes of the spine. Review of the MIP images confirms the above findings. IMPRESSION: 1. All three right lobe and left lower lobe segmental and subsegmental pulmonary emboli. No findings of right heart strain. Possible developing pulmonary infarction in the right upper lobe. 2. At least small to moderate hiatal hernia. Poor visualization of the esophagus due to timing of contrast. 3. Similar-appearing 2.2 x 2.3 cm left apex lobe partially calcified pulmonary nodule (interval decrease in size from PET-CT 06/21/2018). 4. Constipation and urinary bladder distended with urine with otherwise no acute intra-abdominal or intrapelvic abnormality. 5. Other imaging findings of potential clinical significance: Sequela of prior  granulomatous disease of the left lung. Chronic T9 and T11 compression fractures. Aortic Atherosclerosis (ICD10-I70.0). These results were called by telephone at the time of interpretation on 02/07/2021 at 5:43 pm to provider [facility] , who verbally acknowledged these results. Electronically Signed   By: Iven Finn M.D.   On: 02/07/2021 18:01   CT ABDOMEN PELVIS W CONTRAST  Result Date: 02/07/2021 CLINICAL DATA:  wheezes throughout, whenever pt taken off any kind of O2, desaturates to 83%. Pt tachypnic. Abdominal distension EXAM: CT ANGIOGRAPHY CHEST CT ABDOMEN AND PELVIS WITH CONTRAST TECHNIQUE: Multidetector CT imaging of the chest was performed using the standard protocol during bolus administration of intravenous contrast. Multiplanar CT image reconstructions and MIPs were obtained to evaluate the vascular anatomy. Multidetector CT imaging of the abdomen and pelvis was performed using the standard protocol during bolus administration of intravenous contrast. CONTRAST:  67mL OMNIPAQUE IOHEXOL 350 MG/ML SOLN COMPARISON:  CT chest 03/23/2019 FINDINGS: CTA CHEST FINDINGS Cardiovascular: Satisfactory opacification of the pulmonary arteries to the segmental level. Pulmonary artery filling defects within the left lower lobe segmental and subsegmental pulmonary arteries, right upper lobe segmental and subsegmental pulmonary arteries, right lower lobe subsegmental pulmonary arteries, right middle  lobe segmental and subsegmental pulmonary arteries. Some of these appear central and cords like but are not visualized on the prior CT angiography chest 01/08/2021. Normal heart size. No pericardial effusion. Atherosclerotic plaque of the thoracic aorta. Mediastinum/Nodes: No enlarged mediastinal, hilar, or axillary lymph nodes. Thyroid gland and trachea demonstrate no significant findings. At least small to moderate hiatal hernia. Poor visualization of the esophagus due to timing of contrast. Lungs/Pleura: Hazy  airspace opacity peripherally within the right upper lobe (6:45). Likely subsegmental atelectasis at the left lower lobe. Similar-appearing 2.2 x 2.3 cm partially calcified pulmonary nodule (interval decrease in size from PET-CT 06/21/2018). Calcified pulmonary nodule at the left apex (6:28). Left peribronchovascular calcifications likely representing old granulomatous disease (6:40, 57). Associated traction bronchiectasis and architectural distortion. No pulmonary mass. No pleural effusion. No pneumothorax. Musculoskeletal: No chest wall abnormality. No suspicious lytic or blastic osseous lesions. Old healed right rib fractures. No acute displaced fracture. Similar-appearing compression fractures of the T9 and T11 vertebral bodies. Multilevel degenerative changes of the spine. Degenerative changes of the visualized right hand. Review of the MIP images confirms the above findings. CT ABDOMEN and PELVIS FINDINGS Hepatobiliary: No focal liver abnormality. Status post cholecystectomy. No biliary dilatation. Pancreas: No focal lesion. Normal pancreatic contour. No surrounding inflammatory changes. No main pancreatic ductal dilatation. Spleen: Normal in size without focal abnormality. Adrenals/Urinary Tract: No adrenal nodule bilaterally. Bilateral kidneys enhance symmetrically. No hydronephrosis. No hydroureter. The urinary bladder is distended with urine. Stomach/Bowel: Stomach is within normal limits. No evidence of bowel wall thickening or dilatation. Stool throughout the colon. The appendix not definitely identified. Vascular/Lymphatic: No abdominal aorta or iliac aneurysm. Moderate to severe calcified and noncalcified atherosclerotic plaque of the aorta and its branches. No abdominal, pelvic, or inguinal lymphadenopathy. Reproductive: Uterus and bilateral adnexa are unremarkable. Other: No intraperitoneal free fluid. No intraperitoneal free gas. No organized fluid collection. Musculoskeletal: Tiny fat containing  umbilical hernia. No suspicious lytic or blastic osseous lesions. No acute displaced fracture. Multilevel degenerative changes of the spine. Review of the MIP images confirms the above findings. IMPRESSION: 1. All three right lobe and left lower lobe segmental and subsegmental pulmonary emboli. No findings of right heart strain. Possible developing pulmonary infarction in the right upper lobe. 2. At least small to moderate hiatal hernia. Poor visualization of the esophagus due to timing of contrast. 3. Similar-appearing 2.2 x 2.3 cm left apex lobe partially calcified pulmonary nodule (interval decrease in size from PET-CT 06/21/2018). 4. Constipation and urinary bladder distended with urine with otherwise no acute intra-abdominal or intrapelvic abnormality. 5. Other imaging findings of potential clinical significance: Sequela of prior granulomatous disease of the left lung. Chronic T9 and T11 compression fractures. Aortic Atherosclerosis (ICD10-I70.0). These results were called by telephone at the time of interpretation on 02/07/2021 at 5:43 pm to provider [facility] , who verbally acknowledged these results. Electronically Signed   By: Iven Finn M.D.   On: 02/07/2021 18:01      Elice Crigger T. [place]  If 7PM-7AM, please contact night-coverage [email or web address] 02/08/2021, 1:14 PM

## 2021-02-08 NOTE — TOC CM/SW Note (Addendum)
Patient from [facility]. Initially from their ILF but went to rehab side last admission. If discharging before Sunday, will have to go to LTC side rather than rehab due to not meeting 3-night inpatient stay. PASARR still under review from last admission.  Dayton Scrape, CSW [phone]  4:17 pm: [facility] closed previous PASARR screen. CSW started new screening and uploaded clinicals into [place] [facility] for review.  Dayton Scrape, [place]  4:42 pm: PASARR now under level 2 review.  Dayton Scrape, [place]

## 2021-02-08 NOTE — Consult Note (Signed)
Hematology/Oncology Consult note [facility] [phone] [phone]  Patient Care Team: Venia Carbon, MD as PCP - General (Internal Medicine) Pyrtle, Lajuan Lines, MD as Consulting Physician (Gastroenterology) Telford Nab, RN as Registered Nurse Earlie Server, MD as Consulting Physician (Hematology and Oncology) Debbora Dus, [facility] as Pharmacist (Pharmacist) Burnice Logan, [facility] (Inactive) as Pharmacist (Pharmacist) Ralene Bathe, MD (Dermatology) Festus Aloe, MD as Consulting Physician (Urology)   Name of the patient: Jamie Matthews  [id number]  1940/12/18   Date of visit: 02/08/21 REASON FOR COSULTATION:  Pulmonary embolism, lung cancer and breast cancer.  History of presenting illness-  80 y.o. female with PMH listed at below who presents to ER for evaluation of shortness of breath.  Patient reports 2-3 days of progressive shortness of breath, wheezing. She has chronic lower extremity edema which has gotten worse.  02/07/2021 CT chest PE protocol showed all 3 right lobe and left lower lobe segmental and subsegmental pulmonary emboli. No signs of right heart strain. CT abdomen pelvis showed hiatal hernia, 2.2 x 2.3 cm left apex lobe lung nodule, constipation.  Patient was started on anticoagulation with heparin gtt.  02/08/2021 bilateral lower extremities US showed occlusive and non occlusive DVT in the left femoral vein through left posterior tibial veins. Right lower extremity no DVT. 02/08/2021, echocardiogram completed showed LVEF 60-65%.  Left ventricular diastolic dysfunction. Normal right ventricular systolic function.  Patient had acute urinary retention secondary to constipation.  Constipation improved with bowel regimen. Patient is a poor historian.  Today she reports feeling okay.  Breathing has improved.  Patient had bowel movements. 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  Patient also has stage I breast cancer on Arimidex.  Review of Systems  Constitutional:  Positive for fatigue. Negative for appetite change, chills and fever.  HENT:   Negative for hearing loss and voice change.   Eyes:  Negative for eye problems.  Respiratory:  Positive for shortness of breath and wheezing. Negative for chest tightness and cough.   Cardiovascular:  Positive for leg swelling. Negative for chest pain.  Gastrointestinal:  Negative for abdominal distention, abdominal pain and blood in stool.  Endocrine: Negative for hot flashes.  Genitourinary:  Negative for difficulty urinating and frequency.   Musculoskeletal:  Negative for arthralgias.  Skin:  Negative for itching and rash.  Neurological:  Negative for extremity weakness.  Hematological:  Negative for adenopathy.  Psychiatric/Behavioral:  Negative for confusion.    Allergies  Allergen Reactions   Sulfa Antibiotics Rash    Patient Active Problem List   Diagnosis Date Noted   Elevated troponin 02/08/2021   Constipation 02/08/2021   Acute urinary retention 02/08/2021   Prolonged QT interval 02/08/2021   Allergic rhinitis 02/08/2021   Acquired hypothyroidism 02/08/2021   Acute pulmonary embolism ([place]) 02/07/2021   Acute respiratory failure with hypoxia (HCC) 02/05/2021   Shortness of breath 01/08/2021   Aortic valve stenosis 01/19/2020   Left hip pain 12/05/2019   Hypocalcemia 11/02/2019   Trochanteric bursitis 09/20/2019   Pruritus 09/06/2019   Overactive bladder 09/06/2019   Chronic pain of right knee 09/06/2019   Multiple rib fractures 07/15/2019   Fall as cause of accidental injury at home as place of occurrence 07/15/2019   Encounter for antineoplastic chemotherapy 12/17/2018   Lower leg edema 12/17/2018   Aromatase inhibitor use 10/24/2018   Osteopenia 10/24/2018   Fever 10/10/2018   Closed displaced fracture  of pubis ([place]) 09/20/2018   Localized swelling of right lower leg 09/20/2018    AKI (acute kidney injury) ([place]) 08/17/2018   Myofascial pain 07/28/2018   Primary adenocarcinoma of lung ([place]) 07/21/2018   Malignant neoplasm of left female breast ([place]) 07/21/2018   Adenopathy    Goals of care, counseling/discussion 06/26/2018   IBS (irritable bowel syndrome) 05/25/2018   BPV (benign positional vertigo) 07/30/2017   Unilateral primary osteoarthritis, left knee 06/24/2017   Bunion of great toe of right foot 06/09/2017   Dizziness 03/24/2017   Decreased appetite 12/03/2016   Fatigue 09/22/2016   Insomnia 09/22/2016   Depression, recurrent ([place]) 09/22/2016   Chronic cough 08/21/2016   Primary localized osteoarthrosis, hand 03/19/2016   OA (osteoarthritis) of knee 03/17/2016   Stress due to illness of family member 10/10/2014   Ileitis 06/22/2014   Chronic insomnia 09/20/2013   DNR (do not resuscitate) 11/15/2012   Other constipation 04/01/2012   Memory loss 02/05/2012   Urinary incontinence    Major depressive disorder, recurrent, moderate    Gastroesophageal reflux disease    Hyperlipidemia      Past Medical History:  Diagnosis Date   AKI (acute kidney injury) ([place]) 08/17/2018   Anxiety    Arthritis    Belching    Bladder disorder    OVERACTIVE   Bowel dysfunction    BLOCKAGE   Cancer ([place])    breast   Constipation    Depression    Diverticulitis    Fibromyalgia    GERD (gastroesophageal reflux disease)    Hyperlipidemia    IBS (irritable bowel syndrome)    Internal hemorrhoids    Lung cancer ([place]) 2020   Memory deficits    Murmur    asymptomatic   Pneumonia 11/18/12   Urinary incontinence    Vertigo      Past Surgical History:  Procedure Laterality Date   AXILLARY LYMPH NODE BIOPSY Left 06/08/2018   INVASIVE MAMMARY CARCINOMA   BLADDER SUSPENSION  2004, 2012   BREAST BIOPSY Left 06/08/2018   INVASIVE MAMMARY CARCINOMA   CATARACT EXTRACTION W/PHACO Right 08/27/2015   Procedure: CATARACT EXTRACTION PHACO AND INTRAOCULAR LENS PLACEMENT  ([place]);  Surgeon: Estill Cotta, MD;  Location: [facility] ORS;  Service: Ophthalmology;  Laterality: Right;  [place]   1:00.2 AP%  22.5 CDE  23.67 fluid casette lot #[id number]  exp05/31/2018   CATARACT EXTRACTION W/PHACO Left 10/15/2015   Procedure: CATARACT EXTRACTION PHACO AND INTRAOCULAR LENS PLACEMENT (IOC);  Surgeon: Estill Cotta, MD;  Location: [facility] ORS;  Service: Ophthalmology;  Laterality: Left;  [place] 01:07 AP% 18.1 CDE 21.57 fluid pack lot # [id number]   CHOLECYSTECTOMY     COLONOSCOPY  2017   ELECTROMAGNETIC NAVIGATION BROCHOSCOPY N/A 07/09/2018   Procedure: ELECTROMAGNETIC NAVIGATION BRONCHOSCOPY;  Surgeon: Flora Lipps, MD;  Location: [facility] ORS;  Service: Cardiopulmonary;  Laterality: N/A;   TONSILLECTOMY  1947    Social History   Socioeconomic History   Marital status: Married    Spouse name: Scientist, forensic   Number of children: 0   Years of education: Master's Degree   Highest education level: Not on file  Occupational History   Occupation: Retired  Tobacco Use   Smoking status: Never   Smokeless tobacco: Never  Vaping Use   Vaping Use: Never used  Substance and Sexual Activity   Alcohol use: Not Currently    Comment: rare wine   Drug use: No   Sexual activity: Not Currently  Other Topics Concern   Not on  file  Social History Narrative   Recently moved with her husband to [place] from [place].   Husband is a retired Pharmacist, community.   No children.   She is a retired Pharmacist, hospital.   Enjoys: Chief Technology Officer, reading - mysteries and biographies, cooking   Exercise: walking, gardening   Diet: low appetite due to cancer treatment, grazing   She is a DNR.   Social Determinants of Health   Financial Resource Strain: Not on file  Food Insecurity: Not on file  Transportation Needs: Not on file  Physical Activity: Not on file  Stress: Not on file  Social Connections: Not on file  Intimate Partner Violence: Not on file     Family History  Problem Relation Age of Onset   Diabetes  Father    Heart disease Father    Lymphoma Father    Heart disease Mother    Breast cancer Sister 48   Colon cancer Neg Hx    Esophageal cancer Neg Hx    Rectal cancer Neg Hx    Stomach cancer Neg Hx    Bladder Cancer Neg Hx    Kidney cancer Neg Hx      Current Facility-Administered Medications:    acetaminophen (TYLENOL) tablet 650 mg, 650 mg, Oral, Q6H PRN **OR** acetaminophen (TYLENOL) suppository 650 mg, 650 mg, Rectal, Q6H PRN, Howerter, Justin B, DO   albuterol (PROVENTIL) (2.5 MG/3ML) 0.083% nebulizer solution 2.5 mg, 2.5 mg, Nebulization, Q6H PRN, Howerter, Justin B, DO   anastrozole (ARIMIDEX) tablet 1 mg, 1 mg, Oral, Daily, Howerter, Justin B, DO, 1 mg at 02/08/21 4680   benzonatate (TESSALON) capsule 100 mg, 100 mg, Oral, TID PRN, Howerter, Justin B, DO   crizotinib (XALKORI) capsule 250 mg, 250 mg, Oral, BID, Howerter, Justin B, DO   fluticasone (FLONASE) 50 MCG/ACT nasal spray 2 spray, 2 spray, Each Nare, Daily, Howerter, Justin B, DO, 2 spray at 02/08/21 0830   heparin ADULT infusion 100 units/mL (25000 units/289m), 850 Units/hr, Intravenous, Continuous, DDorothe Pea RPH, Last Rate: 8.5 mL/hr at 02/08/21 1532, 850 Units/hr at 02/08/21 1532   levothyroxine (SYNTHROID) tablet 25 mcg, 25 mcg, Oral, Daily, Howerter, Justin B, DO, 25 mcg at 02/08/21 0829   lubiprostone (AMITIZA) capsule 24 mcg, 24 mcg, Oral, BID WC, Howerter, Justin B, DO, 24 mcg at 02/08/21 0829   midodrine (PROAMATINE) tablet 5 mg, 5 mg, Oral, TID WC, Gonfa, Taye T, MD   pantoprazole (PROTONIX) EC tablet 40 mg, 40 mg, Oral, Daily, Howerter, Justin B, DO, 40 mg at 02/08/21 0829   polyethylene glycol (MIRALAX / GLYCOLAX) packet 17 g, 17 g, Oral, BID PRN, Gonfa, Taye T, MD   senna-docusate (Senokot-S) tablet 1 tablet, 1 tablet, Oral, BID PRN, GMercy Riding MD   Physical exam:  Vitals:   02/08/21 0056 02/08/21 0310 02/08/21 0742 02/08/21 1232  BP: (!) 102/43 (!) 105/47 (!) 114/45 (!) 84/69  Pulse: (!)  59 (!) 56 (!) 58 63  Resp: _0 Temp: 97.7 F (36.5 C) 97.8 F (36.6 C) 97.9 F (36.6 C) 98.6 F (37 C)  TempSrc:  Axillary  Oral  SpO2: 90% 96% 92% 94%  Weight:      Height:       Physical Exam      CMP Latest Ref Rng & Units 02/08/2021  Glucose 70 - 99 mg/dL 215(H)  BUN 8 - 23 mg/dL 20  Creatinine 0.44 - 1.00 mg/dL 1.02(H)  Sodium 135 - 145 mmol/L 134(L)  Potassium 3.5 - 5.1 mmol/L 3.7  Chloride 98 - 111 mmol/L 98  CO2 22 - 32 mmol/L 26  Calcium 8.9 - 10.3 mg/dL 6.4(LL)  Total Protein 6.5 - 8.1 g/dL 4.8(L)  Total Bilirubin 0.3 - 1.2 mg/dL 0.8  Alkaline Phos 38 - 126 U/L 90  AST 15 - 41 U/L 36  ALT 0 - 44 U/L 34   CBC Latest Ref Rng & Units 02/08/2021  WBC 4.0 - 10.5 K/uL 8.1  Hemoglobin 12.0 - 15.0 g/dL 9.1(L)  Hematocrit 36.0 - 46.0 % 26.4(L)  Platelets 150 - 400 K/uL 380    RADIOGRAPHIC STUDIES: I have personally reviewed the radiological images as listed and agreed with the findings in the report. DG Chest 2 View  Result Date: 02/07/2021 CLINICAL DATA:  Shortness of breath EXAM: CHEST - 2 VIEW COMPARISON:  Radiograph 01/08/2021, chest CT 01/08/2021 FINDINGS: Unchanged cardiomediastinal silhouette. There is no new focal airspace disease. Known faintly calcified nodule in the left apex is poorly visualized radiographically. Unchanged calcified more peripheral left apical calcified granuloma. There is no large pleural effusion or visible pneumothorax. Elevated right hemidiaphragm with colonic interposition. No acute osseous abnormality. Thoracic spondylosis. IMPRESSION: No new focal airspace disease. Electronically Signed   By: Maurine Simmering M.D.   On: 02/07/2021 16:01   CT HEAD WO CONTRAST (5MM)  Result Date: 02/07/2021 CLINICAL DATA:  Mental status change EXAM: CT HEAD WITHOUT CONTRAST TECHNIQUE: Contiguous axial images were obtained from the base of the skull through the vertex without intravenous contrast. COMPARISON:  MRI 10/24/2020, CT brain 10/31/2018  FINDINGS: Brain: No acute territorial infarction, hemorrhage or intracranial mass. Atrophy and chronic small vessel ischemic changes of the white matter. Stable ventricle size Vascular: No hyperdense vessels.  Carotid vascular calcification Skull: Normal. Negative for fracture or focal lesion. Sinuses/Orbits: No acute finding. Other: None IMPRESSION: 1. No CT evidence for acute intracranial abnormality. 2. Atrophy and chronic small vessel ischemic changes of the white matter Electronically Signed   By: Donavan Foil M.D.   On: 02/07/2021 17:29   CT Angio Chest PE W and/or Wo Contrast  Result Date: 02/07/2021 CLINICAL DATA:  wheezes throughout, whenever pt taken off any kind of O2, desaturates to 83%. Pt tachypnic. Abdominal distension EXAM: CT ANGIOGRAPHY CHEST CT ABDOMEN AND PELVIS WITH CONTRAST TECHNIQUE: Multidetector CT imaging of the chest was performed using the standard protocol during bolus administration of intravenous contrast. Multiplanar CT image reconstructions and MIPs were obtained to evaluate the vascular anatomy. Multidetector CT imaging of the abdomen and pelvis was performed using the standard protocol during bolus administration of intravenous contrast. CONTRAST:  32mL OMNIPAQUE IOHEXOL 350 MG/ML SOLN COMPARISON:  CT chest 03/23/2019 FINDINGS: CTA CHEST FINDINGS Cardiovascular: Satisfactory opacification of the pulmonary arteries to the segmental level. Pulmonary artery filling defects within the left lower lobe segmental and subsegmental pulmonary arteries, right upper lobe segmental and subsegmental pulmonary arteries, right lower lobe subsegmental pulmonary arteries, right middle lobe segmental and subsegmental pulmonary arteries. Some of these appear central and cords like but are not visualized on the prior CT angiography chest 01/08/2021. Normal heart size. No pericardial effusion. Atherosclerotic plaque of the thoracic aorta. Mediastinum/Nodes: No enlarged mediastinal, hilar, or  axillary lymph nodes. Thyroid gland and trachea demonstrate no significant findings. At least small to moderate hiatal hernia. Poor visualization of the esophagus due to timing of contrast. Lungs/Pleura: Hazy airspace opacity peripherally within the right upper lobe (6:45). Likely subsegmental atelectasis at the left lower lobe. Similar-appearing 2.2 x 2.3  cm partially calcified pulmonary nodule (interval decrease in size from PET-CT 06/21/2018). Calcified pulmonary nodule at the left apex (6:28). Left peribronchovascular calcifications likely representing old granulomatous disease (6:40, 57). Associated traction bronchiectasis and architectural distortion. No pulmonary mass. No pleural effusion. No pneumothorax. Musculoskeletal: No chest wall abnormality. No suspicious lytic or blastic osseous lesions. Old healed right rib fractures. No acute displaced fracture. Similar-appearing compression fractures of the T9 and T11 vertebral bodies. Multilevel degenerative changes of the spine. Degenerative changes of the visualized right hand. Review of the MIP images confirms the above findings. CT ABDOMEN and PELVIS FINDINGS Hepatobiliary: No focal liver abnormality. Status post cholecystectomy. No biliary dilatation. Pancreas: No focal lesion. Normal pancreatic contour. No surrounding inflammatory changes. No main pancreatic ductal dilatation. Spleen: Normal in size without focal abnormality. Adrenals/Urinary Tract: No adrenal nodule bilaterally. Bilateral kidneys enhance symmetrically. No hydronephrosis. No hydroureter. The urinary bladder is distended with urine. Stomach/Bowel: Stomach is within normal limits. No evidence of bowel wall thickening or dilatation. Stool throughout the colon. The appendix not definitely identified. Vascular/Lymphatic: No abdominal aorta or iliac aneurysm. Moderate to severe calcified and noncalcified atherosclerotic plaque of the aorta and its branches. No abdominal, pelvic, or inguinal  lymphadenopathy. Reproductive: Uterus and bilateral adnexa are unremarkable. Other: No intraperitoneal free fluid. No intraperitoneal free gas. No organized fluid collection. Musculoskeletal: Tiny fat containing umbilical hernia. No suspicious lytic or blastic osseous lesions. No acute displaced fracture. Multilevel degenerative changes of the spine. Review of the MIP images confirms the above findings. IMPRESSION: 1. All three right lobe and left lower lobe segmental and subsegmental pulmonary emboli. No findings of right heart strain. Possible developing pulmonary infarction in the right upper lobe. 2. At least small to moderate hiatal hernia. Poor visualization of the esophagus due to timing of contrast. 3. Similar-appearing 2.2 x 2.3 cm left apex lobe partially calcified pulmonary nodule (interval decrease in size from PET-CT 06/21/2018). 4. Constipation and urinary bladder distended with urine with otherwise no acute intra-abdominal or intrapelvic abnormality. 5. Other imaging findings of potential clinical significance: Sequela of prior granulomatous disease of the left lung. Chronic T9 and T11 compression fractures. Aortic Atherosclerosis (ICD10-I70.0). These results were called by telephone at the time of interpretation on 02/07/2021 at 5:43 pm to provider [facility] , who verbally acknowledged these results. Electronically Signed   By: Iven Finn M.D.   On: 02/07/2021 18:01   CT ABDOMEN PELVIS W CONTRAST  Result Date: 02/07/2021 CLINICAL DATA:  wheezes throughout, whenever pt taken off any kind of O2, desaturates to 83%. Pt tachypnic. Abdominal distension EXAM: CT ANGIOGRAPHY CHEST CT ABDOMEN AND PELVIS WITH CONTRAST TECHNIQUE: Multidetector CT imaging of the chest was performed using the standard protocol during bolus administration of intravenous contrast. Multiplanar CT image reconstructions and MIPs were obtained to evaluate the vascular anatomy. Multidetector CT imaging of the abdomen and  pelvis was performed using the standard protocol during bolus administration of intravenous contrast. CONTRAST:  59m OMNIPAQUE IOHEXOL 350 MG/ML SOLN COMPARISON:  CT chest 03/23/2019 FINDINGS: CTA CHEST FINDINGS Cardiovascular: Satisfactory opacification of the pulmonary arteries to the segmental level. Pulmonary artery filling defects within the left lower lobe segmental and subsegmental pulmonary arteries, right upper lobe segmental and subsegmental pulmonary arteries, right lower lobe subsegmental pulmonary arteries, right middle lobe segmental and subsegmental pulmonary arteries. Some of these appear central and cords like but are not visualized on the prior CT angiography chest 01/08/2021. Normal heart size. No pericardial effusion. Atherosclerotic plaque of the thoracic aorta. Mediastinum/Nodes:  No enlarged mediastinal, hilar, or axillary lymph nodes. Thyroid gland and trachea demonstrate no significant findings. At least small to moderate hiatal hernia. Poor visualization of the esophagus due to timing of contrast. Lungs/Pleura: Hazy airspace opacity peripherally within the right upper lobe (6:45). Likely subsegmental atelectasis at the left lower lobe. Similar-appearing 2.2 x 2.3 cm partially calcified pulmonary nodule (interval decrease in size from PET-CT 06/21/2018). Calcified pulmonary nodule at the left apex (6:28). Left peribronchovascular calcifications likely representing old granulomatous disease (6:40, 57). Associated traction bronchiectasis and architectural distortion. No pulmonary mass. No pleural effusion. No pneumothorax. Musculoskeletal: No chest wall abnormality. No suspicious lytic or blastic osseous lesions. Old healed right rib fractures. No acute displaced fracture. Similar-appearing compression fractures of the T9 and T11 vertebral bodies. Multilevel degenerative changes of the spine. Degenerative changes of the visualized right hand. Review of the MIP images confirms the above  findings. CT ABDOMEN and PELVIS FINDINGS Hepatobiliary: No focal liver abnormality. Status post cholecystectomy. No biliary dilatation. Pancreas: No focal lesion. Normal pancreatic contour. No surrounding inflammatory changes. No main pancreatic ductal dilatation. Spleen: Normal in size without focal abnormality. Adrenals/Urinary Tract: No adrenal nodule bilaterally. Bilateral kidneys enhance symmetrically. No hydronephrosis. No hydroureter. The urinary bladder is distended with urine. Stomach/Bowel: Stomach is within normal limits. No evidence of bowel wall thickening or dilatation. Stool throughout the colon. The appendix not definitely identified. Vascular/Lymphatic: No abdominal aorta or iliac aneurysm. Moderate to severe calcified and noncalcified atherosclerotic plaque of the aorta and its branches. No abdominal, pelvic, or inguinal lymphadenopathy. Reproductive: Uterus and bilateral adnexa are unremarkable. Other: No intraperitoneal free fluid. No intraperitoneal free gas. No organized fluid collection. Musculoskeletal: Tiny fat containing umbilical hernia. No suspicious lytic or blastic osseous lesions. No acute displaced fracture. Multilevel degenerative changes of the spine. Review of the MIP images confirms the above findings. IMPRESSION: 1. All three right lobe and left lower lobe segmental and subsegmental pulmonary emboli. No findings of right heart strain. Possible developing pulmonary infarction in the right upper lobe. 2. At least small to moderate hiatal hernia. Poor visualization of the esophagus due to timing of contrast. 3. Similar-appearing 2.2 x 2.3 cm left apex lobe partially calcified pulmonary nodule (interval decrease in size from PET-CT 06/21/2018). 4. Constipation and urinary bladder distended with urine with otherwise no acute intra-abdominal or intrapelvic abnormality. 5. Other imaging findings of potential clinical significance: Sequela of prior granulomatous disease of the left lung.  Chronic T9 and T11 compression fractures. Aortic Atherosclerosis (ICD10-I70.0). These results were called by telephone at the time of interpretation on 02/07/2021 at 5:43 pm to provider [facility] , who verbally acknowledged these results. Electronically Signed   By: Iven Finn M.D.   On: 02/07/2021 18:01   US Venous Img Lower Bilateral (DVT)  Result Date: 02/08/2021 CLINICAL DATA:  Bilateral lower extremity edema.  Evaluate for DVT. EXAM: BILATERAL LOWER EXTREMITY VENOUS DOPPLER ULTRASOUND TECHNIQUE: Gray-scale sonography with graded compression, as well as color Doppler and duplex ultrasound were performed to evaluate the lower extremity deep venous systems from the level of the common femoral vein and including the common femoral, femoral, profunda femoral, popliteal and calf veins including the posterior tibial, peroneal and gastrocnemius veins when visible. The superficial great saphenous vein was also interrogated. Spectral Doppler was utilized to evaluate flow at rest and with distal augmentation maneuvers in the common femoral, femoral and popliteal veins. COMPARISON:  Right lower extremity venous Doppler ultrasound-09/20/2018 (negative) Left lower extremity venous Doppler ultrasound-05/28/2015 (negative) FINDINGS: RIGHT LOWER  EXTREMITY Common Femoral Vein: No evidence of thrombus. Normal compressibility, respiratory phasicity and response to augmentation. Saphenofemoral Junction: No evidence of thrombus. Normal compressibility and flow on color Doppler imaging. Profunda Femoral Vein: No evidence of thrombus. Normal compressibility and flow on color Doppler imaging. Femoral Vein: No evidence of thrombus. Normal compressibility, respiratory phasicity and response to augmentation. Popliteal Vein: No evidence of thrombus. Normal compressibility, respiratory phasicity and response to augmentation. Calf Veins: No evidence of thrombus. Normal compressibility and flow on color Doppler imaging. Superficial  Great Saphenous Vein: No evidence of thrombus. Normal compressibility. Other Findings:  None. LEFT LOWER EXTREMITY Common Femoral Vein: No evidence of thrombus. Normal compressibility, respiratory phasicity and response to augmentation. Saphenofemoral Junction: No evidence of thrombus. Normal compressibility and flow on color Doppler imaging. Profunda Femoral Vein: No evidence of thrombus. Normal compressibility and flow on color Doppler imaging. Femoral Vein: While the proximal and mid aspects of the left femoral vein appear widely patent, there is hypoechoic nonocclusive thrombus involving the distal aspect the left femoral vein (image 41) Popliteal Vein: There is hypoechoic near occlusive thrombus involving the left popliteal vein (images 44 and 45 Calf Veins: There is hypoechoic occlusive thrombus involving both paired left posterior tibial veins (image 50). The left peroneal vein appears patent where imaged. Superficial Great Saphenous Vein: No evidence of thrombus. Normal compressibility. Other Findings:  None. IMPRESSION: 1. The examination is positive for mixed occlusive and nonocclusive DVT extending from the distal aspect the left femoral vein through the level of the left posterior tibial veins. 2. No evidence of DVT within the right lower extremity. Electronically Signed   By: Sandi Mariscal M.D.   On: 02/08/2021 15:10   ECHOCARDIOGRAM COMPLETE  Result Date: 02/08/2021    ECHOCARDIOGRAM REPORT   Patient Name:   Jamie Matthews Date of Exam: 02/08/2021 Medical Rec #:  [id number]                     Height:       62.0 in Accession #:    [id number]                    Weight:       137.0 lb Date of Birth:  Sep 24, 1940                      BSA:          1.628 m Patient Age:    81 years                      BP:           114/45 mmHg Patient Gender: F                             HR:           62 bpm. Exam Location:  [facility] Procedure: 2D Echo, Color Doppler and Cardiac Doppler Indications:     Elevated  troponin  History:         Patient has prior history of Echocardiogram examinations, most                  recent 10/10/2018. Signs/Symptoms:Murmur; Risk                  Factors:Dyslipidemia.  Sonographer:     Charmayne Sheer Referring Phys:  [id number] Rhetta Mura Diagnosing  Phys: Ida Rogue MD IMPRESSIONS  1. Left ventricular ejection fraction, by estimation, is 60 to 65%. The left ventricle has normal function. The left ventricle has no regional wall motion abnormalities. Left ventricular diastolic parameters are consistent with Grade II diastolic dysfunction (pseudonormalization).  2. Right ventricular systolic function is normal. The right ventricular size is normal. There is moderately elevated pulmonary artery systolic pressure. The estimated right ventricular systolic pressure is 26.8 mmHg.  3. The mitral valve is normal in structure. No evidence of mitral valve regurgitation. No evidence of mitral stenosis.  4. Tricuspid valve regurgitation is moderate. FINDINGS  Left Ventricle: Left ventricular ejection fraction, by estimation, is 60 to 65%. The left ventricle has normal function. The left ventricle has no regional wall motion abnormalities. The left ventricular internal cavity size was normal in size. There is  no left ventricular hypertrophy. Left ventricular diastolic parameters are consistent with Grade II diastolic dysfunction (pseudonormalization). Right Ventricle: The right ventricular size is normal. No increase in right ventricular wall thickness. Right ventricular systolic function is normal. There is moderately elevated pulmonary artery systolic pressure. The tricuspid regurgitant velocity is 3.56 m/s, and with an assumed right atrial pressure of 5 mmHg, the estimated right ventricular systolic pressure is 34.1 mmHg. Left Atrium: Left atrial size was normal in size. Right Atrium: Right atrial size was normal in size. Pericardium: There is no evidence of pericardial effusion. Mitral Valve: The  mitral valve is normal in structure. No evidence of mitral valve regurgitation. No evidence of mitral valve stenosis. MV peak gradient, 6.1 mmHg. The mean mitral valve gradient is 2.0 mmHg. Tricuspid Valve: The tricuspid valve is normal in structure. Tricuspid valve regurgitation is moderate . No evidence of tricuspid stenosis. Aortic Valve: The aortic valve is normal in structure. Aortic valve regurgitation is not visualized. Mild to moderate aortic valve sclerosis/calcification is present, without any evidence of aortic stenosis. Aortic valve mean gradient measures 10.2 mmHg.  Aortic valve peak gradient measures 18.7 mmHg. Aortic valve area, by VTI measures 2.13 cm. Pulmonic Valve: The pulmonic valve was normal in structure. Pulmonic valve regurgitation is not visualized. No evidence of pulmonic stenosis. Aorta: The aortic root is normal in size and structure. Venous: The inferior vena cava is normal in size with greater than 50% respiratory variability, suggesting right atrial pressure of 3 mmHg. IAS/Shunts: No atrial level shunt detected by color flow Doppler.  LEFT VENTRICLE PLAX 2D LVIDd:         4.00 cm  Diastology LVIDs:         2.50 cm  LV e' medial:    7.94 cm/s LV PW:         1.00 cm  LV E/e' medial:  12.7 LV IVS:        0.70 cm  LV e' lateral:   8.92 cm/s LVOT diam:     2.10 cm  LV E/e' lateral: 11.3 LV SV:         99 LV SV Index:   61 LVOT Area:     3.46 cm  LEFT ATRIUM           Index       RIGHT ATRIUM           Index LA diam:      3.40 cm 2.09 cm/m  RA Area:     13.30 cm LA Vol (A4C): 43.1 ml 26.48 ml/m RA Volume:   35.60 ml  21.87 ml/m  AORTIC VALVE  PULMONIC VALVE AV Area (Vmax):    2.32 cm     PV Vmax:       1.25 m/s AV Area (Vmean):   2.11 cm     PV Vmean:      88.700 cm/s AV Area (VTI):     2.13 cm     PV VTI:        0.272 m AV Vmax:           216.25 cm/s  PV Peak grad:  6.2 mmHg AV Vmean:          152.750 cm/s PV Mean grad:  3.0 mmHg AV VTI:            0.465 m AV Peak  Grad:      18.7 mmHg AV Mean Grad:      10.2 mmHg LVOT Vmax:         145.00 cm/s LVOT Vmean:        93.000 cm/s LVOT VTI:          0.286 m LVOT/AV VTI ratio: 0.61  AORTA Ao Root diam: 3.10 cm MITRAL VALVE                TRICUSPID VALVE MV Area (PHT): 2.73 cm     TR Peak grad:   50.7 mmHg MV Area VTI:   2.24 cm     TR Vmax:        356.00 cm/s MV Peak grad:  6.1 mmHg MV Mean grad:  2.0 mmHg     SHUNTS MV Vmax:       1.23 m/s     Systemic VTI:  0.29 m MV Vmean:      69.8 cm/s    Systemic Diam: 2.10 cm MV Decel Time: 278 msec MV E velocity: 101.00 cm/s MV A velocity: 96.00 cm/s MV E/A ratio:  1.05 Ida Rogue MD Electronically signed by Ida Rogue MD Signature Date/Time: 02/08/2021/2:20:10 PM    Final     Assessment and plan-   #Acute pulmonary embolism and left lower extremity DVT Agrees with anticoagulation.  Currently on heparin gtt. Recommend switch to Eliquis at discharge.  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 Images were reviewed with patient.  Stable disease.  Continue crizotinib  #Early-stage breast cancer, continue Arimidex 1 mg daily. #AKI secondary to urinary retention, status post Foley.  Creatinine has improved to close to baseline. #Constipation, continue bowel regimen. #Hypocalcemia, acute on chronic. Corrected calcium 8.2, start oral calcium supplementation. #Anemia, acute on chronic, multifactorial.  Monitor.  Code status  DNR   Thank you for allowing me to participate in the care of this patient.     Earlie Server, MD, PhD Hematology Oncology [facility] at [facility] Pager- [phone] 02/08/2021

## 2021-02-08 NOTE — Progress Notes (Signed)
ANTICOAGULATION CONSULT NOTE  Pharmacy Consult for heparin Indication: pulmonary embolus  Allergies  Allergen Reactions   Sulfa Antibiotics Rash    Patient Measurements: Height: 5\' 2"  (157.5 cm) Weight: 62.1 kg (137 lb) IBW/kg (Calculated) : 50.1 Heparin Dosing Weight: 62 kg  Vital Signs: Temp: 98.4 F (36.9 C) (09/16 2011) Temp Source: Oral (09/16 2011) BP: 114/53 (09/16 2011) Pulse Rate: 57 (09/16 2011)  Labs: Recent Labs    02/07/21 1449 02/07/21 2334 02/08/21 0332 02/08/21 0515 02/08/21 1345 02/08/21 2126  HGB 9.7*  --  9.1*  --   --   --   HCT 28.6*  --  26.4*  --   --   --   PLT 415*  --  380  --   --   --   APTT 31  --   --   --   --   --   LABPROT 14.6  --   --   --   --   --   INR 1.1  --   --   --   --   --   HEPARINUNFRC  --   --  0.78*  --  0.56 0.59  CREATININE 1.30*  --  1.02*  --   --   --   TROPONINIHS 85* 130*  --  139*  --   --      Estimated Creatinine Clearance: 36.9 mL/min (A) (by C-G formula based on SCr of 1.02 mg/dL (H)).   Medical History: Past Medical History:  Diagnosis Date   AKI (acute kidney injury) ([place]) 08/17/2018   Anxiety    Arthritis    Belching    Bladder disorder    OVERACTIVE   Bowel dysfunction    BLOCKAGE   Cancer (HCC)    breast   Constipation    Depression    Diverticulitis    Fibromyalgia    GERD (gastroesophageal reflux disease)    Hyperlipidemia    IBS (irritable bowel syndrome)    Internal hemorrhoids    Lung cancer ([place]) 2020   Memory deficits    Murmur    asymptomatic   Pneumonia 11/18/12   Urinary incontinence    Vertigo      Assessment: 80 year old female presented with SOB. CTA positive all three right lobe and left lower lobe segmental and subsegmental pulmonary emboli. No anticoagulation note PTA. Pharmacy consult for heparin management.  Date Time HL Rate/Comment  09/16 0332 0.78 Supratherapeutic 950 >850  units/hr 09/16 1345 0.56 Therapeutic x1, keep rate  09/16   2126    0.59     Therapeutic X 2  Goal of Therapy:  Heparin level 0.3-0.7 units/ml Monitor platelets by anticoagulation protocol: Yes   Plan:  9/16:  HL @ 2126 = 0.59, therapeutic X 2  Will continue pt on current rate and recheck HL on 9/17 with AM labs  [place] D, PharmD 02/08/2021 10:59 PM

## 2021-02-08 NOTE — Progress Notes (Signed)
Patient has had 2 large mushy incontinent bowel movements.

## 2021-02-08 NOTE — Progress Notes (Signed)
OT Cancellation Note  Patient Details Name: Jamie Matthews MRN: [id number] DOB: 1941/01/20   Cancelled Treatment:    Reason Eval/Treat Not Completed: Medical issues which prohibited therapy Upon chart review this AM, noted that pt adm for acute PE and that anticoagulation was started at 18:25 on 9/15. Therapy guidelines advise allowing 48 hours of AC therapy before initiating. Will follow up for OT evaluation at later date/time as appropriate. Thank you.   Gerrianne Scale, [place], OTR/L ascom [phone] 02/08/21, 9:52 AM

## 2021-02-08 NOTE — Progress Notes (Signed)
[place] for heparin Indication: pulmonary embolus  Allergies  Allergen Reactions   Sulfa Antibiotics Rash    Patient Measurements: Height: 5\' 2"  (157.5 cm) Weight: 62.1 kg (137 lb) IBW/kg (Calculated) : 50.1 Heparin Dosing Weight: 62 kg  Vital Signs: Temp: 98.6 F (37 C) (09/16 1232) Temp Source: Oral (09/16 1232) BP: 84/69 (09/16 1232) Pulse Rate: 63 (09/16 1232)  Labs: Recent Labs    02/07/21 1449 02/07/21 2334 02/08/21 0332 02/08/21 0515 02/08/21 1345  HGB 9.7*  --  9.1*  --   --   HCT 28.6*  --  26.4*  --   --   PLT 415*  --  380  --   --   APTT 31  --   --   --   --   LABPROT 14.6  --   --   --   --   INR 1.1  --   --   --   --   HEPARINUNFRC  --   --  0.78*  --  0.56  CREATININE 1.30*  --  1.02*  --   --   TROPONINIHS 85* 130*  --  139*  --      Estimated Creatinine Clearance: 36.9 mL/min (A) (by C-G formula based on SCr of 1.02 mg/dL (H)).   Medical History: Past Medical History:  Diagnosis Date   AKI (acute kidney injury) ([place]) 08/17/2018   Anxiety    Arthritis    Belching    Bladder disorder    OVERACTIVE   Bowel dysfunction    BLOCKAGE   Cancer (HCC)    breast   Constipation    Depression    Diverticulitis    Fibromyalgia    GERD (gastroesophageal reflux disease)    Hyperlipidemia    IBS (irritable bowel syndrome)    Internal hemorrhoids    Lung cancer ([place]) 2020   Memory deficits    Murmur    asymptomatic   Pneumonia 11/18/12   Urinary incontinence    Vertigo      Assessment: 80 year old female presented with SOB. CTA positive all three right lobe and left lower lobe segmental and subsegmental pulmonary emboli. No anticoagulation note PTA. Pharmacy consult for heparin management.  Date Time HL Rate/Comment  09/16 0332 0.78 Supratherapeutic 950 >850  units/hr 09/16 1345 0.56 Therapeutic x1, keep rate  Goal of Therapy:  Heparin level 0.3-0.7 units/ml Monitor platelets by anticoagulation  protocol: Yes   Plan:  Heparin therapeutic  Continue Heparin infusion at 850 units/hr Check HL 8 hours to confirm therapeutic  CBC daily while on heparin  Narda Rutherford, PharmD Pharmacy Resident  02/08/2021 2:12 PM

## 2021-02-09 DIAGNOSIS — E039 Hypothyroidism, unspecified: Secondary | ICD-10-CM | POA: Diagnosis not present

## 2021-02-09 DIAGNOSIS — E271 Primary adrenocortical insufficiency: Secondary | ICD-10-CM

## 2021-02-09 DIAGNOSIS — I272 Pulmonary hypertension, unspecified: Secondary | ICD-10-CM

## 2021-02-09 DIAGNOSIS — R338 Other retention of urine: Secondary | ICD-10-CM | POA: Diagnosis not present

## 2021-02-09 DIAGNOSIS — K5900 Constipation, unspecified: Secondary | ICD-10-CM | POA: Diagnosis not present

## 2021-02-09 DIAGNOSIS — I2699 Other pulmonary embolism without acute cor pulmonale: Secondary | ICD-10-CM | POA: Diagnosis not present

## 2021-02-09 LAB — VITAMIN B12: Vitamin B-12: 1138 pg/mL — ABNORMAL HIGH (ref 180–914)

## 2021-02-09 LAB — CK: Total CK: 160 U/L (ref 38–234)

## 2021-02-09 LAB — IRON AND TIBC
Iron: 110 ug/dL (ref 28–170)
Saturation Ratios: 61 % — ABNORMAL HIGH (ref 10.4–31.8)
TIBC: 181 ug/dL — ABNORMAL LOW (ref 250–450)
UIBC: 71 ug/dL

## 2021-02-09 LAB — RETIC PANEL
Immature Retic Fract: 35 % — ABNORMAL HIGH (ref 2.3–15.9)
RBC.: 3.04 MIL/uL — ABNORMAL LOW (ref 3.87–5.11)
Retic Count, Absolute: 81.5 10*3/uL (ref 19.0–186.0)
Retic Ct Pct: 2.7 % (ref 0.4–3.1)
Reticulocyte Hemoglobin: 28.5 pg (ref >27.9)

## 2021-02-09 LAB — HEPARIN LEVEL (UNFRACTIONATED): Heparin Unfractionated: 0.48 IU/mL (ref 0.30–0.70)

## 2021-02-09 LAB — RENAL FUNCTION PANEL
Albumin: 2 g/dL — ABNORMAL LOW (ref 3.5–5.0)
Anion gap: 7 (ref 5–15)
BUN: 22 mg/dL (ref 8–23)
CO2: 28 mmol/L (ref 22–32)
Calcium: 6.9 mg/dL — ABNORMAL LOW (ref 8.9–10.3)
Chloride: 102 mmol/L (ref 98–111)
Creatinine, Ser: 0.97 mg/dL (ref 0.44–1.00)
GFR, Estimated: 58 mL/min — ABNORMAL LOW (ref >60)
Glucose, Bld: 112 mg/dL — ABNORMAL HIGH (ref 70–99)
Phosphorus: 2.9 mg/dL (ref 2.5–4.6)
Potassium: 3.6 mmol/L (ref 3.5–5.1)
Sodium: 137 mmol/L (ref 135–145)

## 2021-02-09 LAB — FOLATE: Folate: 8.3 ng/mL (ref >5.9)

## 2021-02-09 LAB — CBC
HCT: 27.9 % — ABNORMAL LOW (ref 36.0–46.0)
Hemoglobin: 9.4 g/dL — ABNORMAL LOW (ref 12.0–15.0)
MCH: 31.4 pg (ref 26.0–34.0)
MCHC: 33.7 g/dL (ref 30.0–36.0)
MCV: 93.3 fL (ref 80.0–100.0)
Platelets: 463 10*3/uL — ABNORMAL HIGH (ref 150–400)
RBC: 2.99 MIL/uL — ABNORMAL LOW (ref 3.87–5.11)
RDW: 15.1 % (ref 11.5–15.5)
WBC: 14.3 10*3/uL — ABNORMAL HIGH (ref 4.0–10.5)
nRBC: 0.3 % — ABNORMAL HIGH (ref 0.0–0.2)

## 2021-02-09 LAB — MAGNESIUM: Magnesium: 3 mg/dL — ABNORMAL HIGH (ref 1.7–2.4)

## 2021-02-09 LAB — CORTISOL-AM, BLOOD: Cortisol - AM: 2.1 ug/dL — ABNORMAL LOW (ref 6.7–22.6)

## 2021-02-09 LAB — FERRITIN: Ferritin: 697 ng/mL — ABNORMAL HIGH (ref 11–307)

## 2021-02-09 MED ORDER — APIXABAN 5 MG PO TABS: 5.0000 mg | ORAL_TABLET | Freq: Two times a day (BID) | ORAL | Status: DC

## 2021-02-09 MED ORDER — SENNOSIDES-DOCUSATE SODIUM 8.6-50 MG PO TABS: 1.0000 | ORAL_TABLET | Freq: Two times a day (BID) | ORAL | 0 refills | Status: DC | PRN

## 2021-02-09 MED ORDER — APIXABAN 5 MG PO TABS
10.0000 mg | ORAL_TABLET | Freq: Two times a day (BID) | ORAL | Status: DC
  Administered 2021-02-09 – 2021-02-11 (×5): 10 mg via ORAL
  Filled 2021-02-09 (×5): qty 2

## 2021-02-09 MED ORDER — FUROSEMIDE 40 MG PO TABS: 80.0000 mg | ORAL_TABLET | Freq: Every day | ORAL | Status: DC

## 2021-02-09 MED ORDER — MIDODRINE HCL 5 MG PO TABS: 5.0000 mg | ORAL_TABLET | Freq: Three times a day (TID) | ORAL | Status: DC

## 2021-02-09 MED ORDER — COSYNTROPIN 0.25 MG IJ SOLR
0.2500 mg | Freq: Once | INTRAMUSCULAR | Status: AC
  Administered 2021-02-10: 0.25 mg via INTRAVENOUS
  Filled 2021-02-09: qty 0.25

## 2021-02-09 MED ORDER — CALCIUM CARBONATE 1250 (500 CA) MG PO TABS
1.0000 | ORAL_TABLET | Freq: Every day | ORAL | Status: DC
  Administered 2021-02-09 – 2021-02-11 (×3): 500 mg via ORAL
  Filled 2021-02-09 (×4): qty 1

## 2021-02-09 MED ORDER — APIXABAN (ELIQUIS) VTE STARTER PACK (10MG AND 5MG): ORAL_TABLET | ORAL | 0 refills | Status: DC

## 2021-02-09 NOTE — Evaluation (Signed)
Physical Therapy Evaluation Patient Details Name: Jamie Matthews MRN: [id number] DOB: 21-Apr-1941 Today's Date: 02/09/2021  History of Present Illness  Pt is an 80 y/o F with PMH: L breast cancer stage Ib, primary adenocarcinoma of the lung stage IVa, hypothyroid, acid reflux, hyperlipidemia, depression, memory decline, aortic atherosclerosis. Pt with adm 1 MA d/t weakness and hypoxia. Pt adm this hospitalization d/t pulmonary embolism, AKI, acute urinary retention and constipation. Started on heparin gtt, now switched to eliquis. Pt OK to participate with OT/PT 9/17 per MD secondary to imaging completed and reviewed.   Clinical Impression  Pt was pleasant and motivated to participate during the session. Pt required some physical assistance to come to standing and was only able to amb a max of around 3 feet before needing to return to sitting secondary to weakness.  Pt's BLEs were noted to be minimally tremulous while in standing but did not buckle.  Pt's SpO2 and HR were both WNL on room air with pt reporting no adverse symptoms.  Pt will benefit from PT services in a SNF setting upon discharge to safely address deficits listed in patient problem list for decreased caregiver assistance and eventual return to PLOF.         Recommendations for follow up therapy are one component of a multi-disciplinary discharge planning process, led by the attending physician.  Recommendations may be updated based on patient status, additional functional criteria and insurance authorization.  Follow Up Recommendations SNF    Equipment Recommendations  Other (comment) (TBD at next venue of care)    Recommendations for Other Services       Precautions / Restrictions Precautions Precautions: Fall Restrictions Weight Bearing Restrictions: No      Mobility  Bed Mobility Overal bed mobility: Needs Assistance Bed Mobility: Supine to Sit     Supine to sit: Min assist;HOB elevated      General bed mobility comments: NT, pt in recliner    Transfers Overall transfer level: Needs assistance Equipment used: Rolling walker (2 wheeled) Transfers: Sit to/from Stand Sit to Stand: Min assist         General transfer comment: Mod verbal cues for hand placement; poor eccentric control  Ambulation/Gait Ambulation/Gait assistance: Min guard Gait Distance (Feet): 3 Feet Assistive device: Rolling walker (2 wheeled) Gait Pattern/deviations: Step-through pattern;Decreased step length - right;Decreased step length - left;Trunk flexed Gait velocity: decreased   General Gait Details: Pt able to take several small steps near the chair with BLE's mildly tremulous before needing to return to sitting with poor eccentric control  Stairs            Wheelchair Mobility    Modified Rankin (Stroke Patients Only)       Balance Overall balance assessment: Needs assistance   Sitting balance-Leahy Scale: Fair     Standing balance support: Bilateral upper extremity supported;During functional activity Standing balance-Leahy Scale: Poor Standing balance comment: Heavy lean on the RW for support with tremulous BLEs                             Pertinent Vitals/Pain Pain Assessment: No/denies pain    Home Living Family/patient expects to be discharged to:: Private residence Living Arrangements: Spouse/significant other Available Help at Discharge: Family;Available 24 hours/day Type of Home: House Home Access: Stairs to enter   [employer] of Steps: threshold Home Layout: One level Home Equipment: [place] - 2 wheels;Cane - single point;Wheelchair - Brewing technologist  Prior Function Level of Independence: Independent with assistive device(s)         Comments: Pt is a poor historian. Information collected from previous therapy evaluation. Pt confirmed having RW but only using intermittently. States she is INDEP for ADLs. States she performs  IADLs with her spouse and that they have someone come assist with laundry.     Hand Dominance   Dominant Hand: Right    Extremity/Trunk Assessment   Upper Extremity Assessment Upper Extremity Assessment: Generalized weakness    Lower Extremity Assessment Lower Extremity Assessment: Generalized weakness       Communication   Communication: No difficulties  Cognition Arousal/Alertness: Awake/alert Behavior During Therapy: WFL for tasks assessed/performed Overall Cognitive Status: No family/caregiver present to determine baseline cognitive functioning                                        General Comments      Exercises Total Joint Exercises Ankle Circles/Pumps: AROM;Strengthening;Both;5 reps;10 reps Quad Sets: Strengthening;Both;10 reps Gluteal Sets: Strengthening;Both;10 reps Towel Squeeze: Strengthening;Both;10 reps Hip ABduction/ADduction: Strengthening;Both;10 reps Straight Leg Raises: AROM;AAROM;Strengthening;Both;5 reps Long Arc Quad: AROM;Strengthening;Both;10 reps Knee Flexion: 10 reps;Both;Strengthening;AROM Other Exercises Other Exercises: Static standing with BUE support 2 x 30-45 sec for improved activity tolerance   Assessment/Plan    PT Assessment Patient needs continued PT services  PT Problem List Decreased strength;Decreased activity tolerance;Decreased balance;Decreased mobility;Decreased knowledge of use of DME       PT Treatment Interventions DME instruction;Gait training;Stair training;Functional mobility training;Therapeutic activities;Therapeutic exercise;Balance training;Patient/family education    PT Goals (Current goals can be found in the Care Plan section)  Acute Rehab PT Goals Patient Stated Goal: To get better PT Goal Formulation: With patient Time For Goal Achievement: 02/22/21 Potential to Achieve Goals: Fair    Frequency Min 2X/week   Barriers to discharge Inaccessible home environment;Decreased caregiver  support      Co-evaluation               AM-PAC PT "6 Clicks" Mobility  Outcome Measure Help needed turning from your back to your side while in a flat bed without using bedrails?: A Little Help needed moving from lying on your back to sitting on the side of a flat bed without using bedrails?: A Little Help needed moving to and from a bed to a chair (including a wheelchair)?: A Little Help needed standing up from a chair using your arms (e.g., wheelchair or bedside chair)?: A Little Help needed to walk in hospital room?: Total Help needed climbing 3-5 steps with a railing? : Total 6 Click Score: 14    End of Session Equipment Utilized During Treatment: Gait belt Activity Tolerance: Patient tolerated treatment well Patient left: in chair;with call bell/phone within reach;with chair alarm set Nurse Communication: Mobility status PT Visit Diagnosis: Unsteadiness on feet (R26.81);Difficulty in walking, not elsewhere classified (R26.2);Muscle weakness (generalized) (M62.81)    Time: 6378-5885 PT Time Calculation (min) (ACUTE ONLY): 26 min   Charges:   PT Evaluation $PT Eval Moderate Complexity: 1 Mod PT Treatments $Therapeutic Exercise: 8-22 mins        D. Royetta Asal PT, DPT 02/09/21, 3:29 PM

## 2021-02-09 NOTE — Progress Notes (Signed)
ANTICOAGULATION CONSULT NOTE  Pharmacy Consult for Eliquis Indication: pulmonary embolus  Allergies  Allergen Reactions   Sulfa Antibiotics Rash    Patient Measurements: Height: 5\' 2"  (157.5 cm) Weight: 77.7 kg (171 lb 4.8 oz) IBW/kg (Calculated) : 50.1  Vital Signs: Temp: 98.2 F (36.8 C) (09/17 0401) Temp Source: Oral (09/17 0026) BP: 123/49 (09/17 0401) Pulse Rate: 57 (09/17 0401)  Labs: Recent Labs    02/07/21 1449 02/07/21 1449 02/07/21 2334 02/08/21 0332 02/08/21 0515 02/08/21 1345 02/08/21 2126 02/09/21 0519  HGB 9.7*  --   --  9.1*  --   --   --  9.4*  HCT 28.6*  --   --  26.4*  --   --   --  27.9*  PLT 415*  --   --  380  --   --   --  463*  APTT 31  --   --   --   --   --   --   --   LABPROT 14.6  --   --   --   --   --   --   --   INR 1.1  --   --   --   --   --   --   --   HEPARINUNFRC  --    < >  --  0.78*  --  0.56 0.59 0.48  CREATININE 1.30*  --   --  1.02*  --   --   --  0.97  CKTOTAL  --   --   --   --   --   --   --  160  TROPONINIHS 85*  --  130*  --  139*  --   --   --    < > = values in this interval not displayed.     Estimated Creatinine Clearance: 43.1 mL/min (by C-G formula based on SCr of 0.97 mg/dL).   Medical History: Past Medical History:  Diagnosis Date   AKI (acute kidney injury) ([place]) 08/17/2018   Anxiety    Arthritis    Belching    Bladder disorder    OVERACTIVE   Bowel dysfunction    BLOCKAGE   Cancer (HCC)    breast   Constipation    Depression    Diverticulitis    Fibromyalgia    GERD (gastroesophageal reflux disease)    Hyperlipidemia    IBS (irritable bowel syndrome)    Internal hemorrhoids    Lung cancer ([place]) 2020   Memory deficits    Murmur    asymptomatic   Pneumonia 11/18/12   Urinary incontinence    Vertigo      Assessment: 80 year old female presented with SOB. CTA positive all three right lobe and left lower lobe segmental and subsegmental pulmonary emboli. No anticoagulation noted PTA.  Pharmacy consult to transition heparin to Eliquis.    Goal of Therapy:  Heparin level 0.3-0.7 units/ml Monitor platelets by anticoagulation protocol: Yes   Plan:  Eliquis 10 mg BID x 7 days followed by 5 mg BID.  Tawnya Crook, PharmD, BCPS Clinical Pharmacist 02/09/2021 7:59 AM

## 2021-02-09 NOTE — Progress Notes (Signed)
PROGRESS NOTE  Jamie Matthews LOV:[id number] DOB: 1941/05/24   PCP: Venia Carbon, MD  Patient is from: SNF  DOA: 02/07/2021 LOS: 2  Chief complaints:  Shortness of breath  Brief Narrative / Interim history: 80 year old F with history of lung cancer, breast cancer, chronic hypoxic respiratory failure on 2 L, hypothyroidism, incontinence and anemia of chronic disease presenting with progressive shortness of breath, wheeze and work of breathing for 2 days, and admitted for acute pulmonary embolism, AKI, acute urinary retention and constipation.  CTA chest showed bilateral segmental and subsegmental PE in right lung and LLL without evidence of right heart strain.  She was a started on IV heparin.  TTE with LVEF of 60 to 65%, G2 DD and RVSP of 55.7.  LE [place] positive for left distal femoral vein and popliteal vein DVT.  She was transitioned to starter pack Eliquis.  Evaluated by her oncologist as well.    In regards to acute urinary retention, she had 1.9 L UOP with I and O cath.  Foley catheter inserted on 02/08/2021.  Myrbetriq held on admission.  Recommend voiding trial in about 10 days.  If she fails voiding trial, she needs outpatient urology follow-up.  Constipation has resolved with bowel regimen.  Subjective: Seen and examined earlier this morning.  No major events overnight of this morning.  No complaints.  She denies shortness of breath, chest pain, palpitation, dizziness, GI or UTI symptoms.  Indwelling Foley catheter in place.  Objective: Vitals:   02/09/21 0401 02/09/21 0515 02/09/21 0715 02/09/21 1112  BP: (!) 123/49  (!) 125/55 (!) 120/50  Pulse: (!) 57  63 (!) 55  Resp: 15  16 18   Temp: 98.2 F (36.8 C)  98.6 F (37 C) 98 F (36.7 C)  TempSrc:      SpO2: 95%  91% 92%  Weight:  77.7 kg    Height:        Intake/Output Summary (Last 24 hours) at 02/09/2021 1143 Last data filed at 02/09/2021 0515 Gross per 24 hour  Intake 408.79 ml  Output 1420 ml  Net  -1011.21 ml   Filed Weights   02/07/21 1436 02/09/21 0515  Weight: 62.1 kg 77.7 kg    Examination:  GENERAL: No apparent distress.  Nontoxic. HEENT: MMM.  Vision and hearing grossly intact.  NECK: Supple.  No apparent JVD.  RESP: 95% on 2 L.  No IWOB.  Fair aeration bilaterally. CVS:  RRR. Heart sounds normal.  ABD/GI/GU: BS+. Abd soft, NTND.  Indwelling Foley in place. MSK/EXT:  Moves extremities. No apparent deformity.  2+ pitting edema bilaterally. SKIN: no apparent skin lesion or wound NEURO: Awake, alert and oriented appropriately.  No apparent focal neuro deficit. PSYCH: Calm. Normal affect.   Procedures:  None  Microbiology summarized: [id number] and influenza PCR nonreactive.  Assessment & Plan: Acute bilateral segmental and subsegmental PE/left lower extremity DVT-no CT evidence of right heart strain but elevated BNP, troponin and elevated RVSP on echocardiogram.  Unclear if the elevated RVSP is acute or chronic. -Transition to starter pack Eliquis. -Needs anticoagulation indefinitely -Appreciate visit by her oncologist.   Hypotension-improved after starting midodrine.  Not on antihypertensive meds.  TSH within normal.  PAH could contribute.  A.m. cortisol low at 2.1 -Continue holding diuretics -Continue midodrine -Check orthostatic vitals if able -Check ACTH and ACTH stimulation test in the morning   AKI: Due to hypotension?  No hydronephrosis or hydroureter on CT but a distended bladder.  She had about  1.9 L UOP on I&O cath.  Improving. Recent Labs    10/15/20 1334 11/27/20 1442 12/28/20 1445 01/08/21 0837 01/09/21 0507 01/16/21 1342 02/05/21 1328 02/07/21 1449 02/08/21 0332 02/09/21 0519  BUN 27* 23 24* 29* 26* 31* 24* 22 20 22   CREATININE 1.16* 1.21* 1.42* 1.23* 1.14* 0.93 1.08* 1.30* 1.02* 0.97  -Indwelling Foley catheter for 10 days for bladder rest -Continue midodrine as above -Continue holding Lasix -Monitor renal function   Chronic respiratory  failure with hypoxia: On 2 L at baseline. -Continue home supplemental oxygen   History of lung cancer History of breast cancer -Continue home Crizotinib for lung cancer -Continue home Arimidex for breast cancer -Appreciate input by oncology   Acute urinary retention with bladder over distention: She had 1.9 L UOP on I&O cath.  No hydronephrosis or hydroureter on CT abdomen and pelvis. -Indwelling Foley catheter for 10 days and voiding trial at [facility] -Outpatient urology follow-up if she fails voiding trial -Discontinue Myrbetriq   Bilateral lower extremity edema-multifactorial including malignancy, hypoalbuminemia, PAH and possible adrenal insufficiency.  TSH within normal.   -Encourage leg elevation -Work-up for adrenal insufficiency as above -Consider diuretics if blood pressure improves.   Constipation: Resolved. -Continue home Amitiza. -Change MiraLAX and Senokot-S2 as needed   Elevated troponin-likely demand ischemia and delayed clearance.  No chest pain.  No acute ischemic finding on EKG. TTE as above.   Hyponatremia: Resolved. -Work-up for AKI as above   Hypocalcemia-due to malignancy?  Vitamin D 67. -P.o. Os-Cal   Anemia of chronic disease: Slight drop in Hgb likely from IV fluid.  Stable now.  Anemia panel basically normal. Recent Labs    09/04/20 1317 10/15/20 1334 11/27/20 1442 12/28/20 1445 01/08/21 0837 01/09/21 0507 01/16/21 1342 02/07/21 1449 02/08/21 0332 02/09/21 0519  HGB 12.0 12.3 13.2 12.1 11.5* 10.8* 11.1* 9.7* 9.1* 9.4*  -Monitor intermittently   Depression-Per med rec, no longer takes Wellbutrin. -Continue home Effexor   Body mass index is 31.33 kg/m.         DVT prophylaxis:  SCDs Start: 02/07/21 1858 apixaban (ELIQUIS) tablet 10 mg  apixaban (ELIQUIS) tablet 5 mg  Code Status: DNR/DNI Family Communication: Updated patient's sister with patient's permission on 9/16 Level of care: Progressive Cardiac.  Transferred to [place] with  telemetry. Status is: Inpatient  Remains inpatient appropriate because:Ongoing diagnostic testing needed not appropriate for outpatient work up, Unsafe d/c plan, and Inpatient level of care appropriate due to severity of illness  Dispo: The patient is from: SNF              Anticipated d/c is to: SNF              Patient currently is not medically stable to d/c.   Difficult to place patient No       Consultants:  Oncology   Sch Meds:  Scheduled Meds:  anastrozole  1 mg Oral Daily   apixaban  10 mg Oral BID   Followed by   Derrill Memo ON 02/16/2021] apixaban  5 mg Oral BID   calcium carbonate  1 tablet Oral Q breakfast   [START ON 02/10/2021] cosyntropin  0.25 mg Intravenous Once   crizotinib  250 mg Oral BID   fluticasone  2 spray Each Nare Daily   levothyroxine  25 mcg Oral Daily   lubiprostone  24 mcg Oral BID WC   midodrine  5 mg Oral TID WC   pantoprazole  40 mg Oral Daily   sodium chloride flush  10 mL Intravenous Q12H   Continuous Infusions: PRN Meds:.acetaminophen **OR** acetaminophen, albuterol, alum & mag hydroxide-simeth, benzonatate, polyethylene glycol, senna-docusate  Antimicrobials: Anti-infectives (From admission, onward)    None        I have personally reviewed the following labs and images: CBC: Recent Labs  Lab 02/07/21 1449 02/08/21 0332 02/09/21 0519  WBC 11.7* 8.1 14.3*  HGB 9.7* 9.1* 9.4*  HCT 28.6* 26.4* 27.9*  MCV 93.2 92.6 93.3  PLT 415* 380 463*   BMP &GFR Recent Labs  Lab 02/05/21 1328 02/07/21 1449 02/08/21 0332 02/09/21 0519  NA 133* 134* 134* 137  K 3.9 3.6 3.7 3.6  CL 96* 96* 98 102  CO2 29 30 26 28   GLUCOSE 163* 182* 215* 112*  BUN 24* 22 20 22   CREATININE 1.08* 1.30* 1.02* 0.97  CALCIUM 6.7* 6.2* 6.4* 6.9*  MG  --  2.7* 2.8* 3.0*  PHOS  --   --  3.1 2.9   Estimated Creatinine Clearance: 43.1 mL/min (by C-G formula based on SCr of 0.97 mg/dL). Liver & Pancreas: Recent Labs  Lab 02/07/21 1449 02/08/21 0332  02/09/21 0519  AST 42* 36  --   ALT 37 34  --   ALKPHOS 95 90  --   BILITOT 0.6 0.8  --   PROT 5.2* 4.8*  --   ALBUMIN 2.0* 1.9* 2.0*   No results for input(s): LIPASE, AMYLASE in the last 168 hours. No results for input(s): AMMONIA in the last 168 hours. Diabetic: No results for input(s): HGBA1C in the last 72 hours. Recent Labs  Lab 02/08/21 0743 02/08/21 1729  GLUCAP 172* 138*   Cardiac Enzymes: Recent Labs  Lab 02/09/21 0519  CKTOTAL 160   No results for input(s): PROBNP in the last 8760 hours. Coagulation Profile: Recent Labs  Lab 02/07/21 1449  INR 1.1   Thyroid Function Tests: Recent Labs    02/08/21 0332  TSH 1.607   Lipid Profile: No results for input(s): CHOL, HDL, LDLCALC, TRIG, CHOLHDL, LDLDIRECT in the last 72 hours. Anemia Panel: Recent Labs    02/09/21 0519  VITAMINB12 1,138*  FOLATE 8.3  FERRITIN 697*  TIBC 181*  IRON 110  RETICCTPCT 2.7   Urine analysis:    Component Value Date/Time   COLORURINE YELLOW (A) 01/08/2021 1300   APPEARANCEUR HAZY (A) 01/08/2021 1300   APPEARANCEUR Clear 09/23/2019 1103   LABSPEC 1.021 01/08/2021 1300   LABSPEC 1.027 06/10/2014 1759   PHURINE 7.0 01/08/2021 1300   GLUCOSEU NEGATIVE 01/08/2021 1300   GLUCOSEU Negative 06/10/2014 1759   HGBUR NEGATIVE 01/08/2021 1300   BILIRUBINUR NEGATIVE 01/08/2021 1300   BILIRUBINUR Negative 09/23/2019 1103   BILIRUBINUR Negative 06/10/2014 1759   KETONESUR NEGATIVE 01/08/2021 1300   PROTEINUR NEGATIVE 01/08/2021 1300   UROBILINOGEN 0.2 08/20/2017 1134   NITRITE NEGATIVE 01/08/2021 1300   LEUKOCYTESUR NEGATIVE 01/08/2021 1300   LEUKOCYTESUR Trace 06/10/2014 1759   Sepsis Labs: Invalid input(s): PROCALCITONIN, [place]  Microbiology: Recent Results (from the past 240 hour(s))  Resp Panel by RT-PCR (Flu A&B, Covid) Nasopharyngeal Swab     Status: None   Collection Time: 02/07/21  3:57 PM   Specimen: Nasopharyngeal Swab; Nasopharyngeal(NP) swabs in vial  transport medium  Result Value Ref Range Status   SARS Coronavirus 2 by RT PCR NEGATIVE NEGATIVE Final    Comment: (NOTE) SARS-CoV-2 target nucleic acids are NOT DETECTED.  The SARS-CoV-2 RNA is generally detectable in upper respiratory specimens during the acute phase of infection. The lowest concentration  of SARS-CoV-2 viral copies this assay can detect is 138 copies/mL. A negative result does not preclude SARS-Cov-2 infection and should not be used as the sole basis for treatment or other patient management decisions. A negative result may occur with  improper specimen collection/handling, submission of specimen other than nasopharyngeal swab, presence of viral mutation(s) within the areas targeted by this assay, and inadequate number of viral copies(<138 copies/mL). A negative result must be combined with clinical observations, patient history, and epidemiological information. The expected result is Negative.  Fact Sheet for Patients:  EntrepreneurPulse.com.au  Fact Sheet for Healthcare Providers:  IncredibleEmployment.be  This test is no t yet approved or cleared by the [place] FDA and  has been authorized for detection and/or diagnosis of SARS-CoV-2 by FDA under an Emergency Use Authorization (EUA). This EUA will remain  in effect (meaning this test can be used) for the duration of the COVID-19 declaration under Section 564(b)(1) of the Act, 21 U.S.C.section 360bbb-3(b)(1), unless the authorization is terminated  or revoked sooner.       Influenza A by PCR NEGATIVE NEGATIVE Final   Influenza B by PCR NEGATIVE NEGATIVE Final    Comment: (NOTE) The Xpert Xpress SARS-CoV-2/FLU/RSV plus assay is intended as an aid in the diagnosis of influenza from Nasopharyngeal swab specimens and should not be used as a sole basis for treatment. Nasal washings and aspirates are unacceptable for Xpert Xpress SARS-CoV-2/FLU/RSV testing.  Fact  Sheet for Patients: EntrepreneurPulse.com.au  Fact Sheet for Healthcare Providers: IncredibleEmployment.be  This test is not yet approved or cleared by the [place] FDA and has been authorized for detection and/or diagnosis of SARS-CoV-2 by FDA under an Emergency Use Authorization (EUA). This EUA will remain in effect (meaning this test can be used) for the duration of the COVID-19 declaration under Section 564(b)(1) of the Act, 21 U.S.C. section 360bbb-3(b)(1), unless the authorization is terminated or revoked.  Performed at [facility], [address], [place], [place] [address]     Radiology Studies: US Venous Img Lower Bilateral (DVT)  Result Date: 02/08/2021 CLINICAL DATA:  Bilateral lower extremity edema.  Evaluate for DVT. EXAM: BILATERAL LOWER EXTREMITY VENOUS DOPPLER ULTRASOUND TECHNIQUE: Gray-scale sonography with graded compression, as well as color Doppler and duplex ultrasound were performed to evaluate the lower extremity deep venous systems from the level of the common femoral vein and including the common femoral, femoral, profunda femoral, popliteal and calf veins including the posterior tibial, peroneal and gastrocnemius veins when visible. The superficial great saphenous vein was also interrogated. Spectral Doppler was utilized to evaluate flow at rest and with distal augmentation maneuvers in the common femoral, femoral and popliteal veins. COMPARISON:  Right lower extremity venous Doppler ultrasound-09/20/2018 (negative) Left lower extremity venous Doppler ultrasound-05/28/2015 (negative) FINDINGS: RIGHT LOWER EXTREMITY Common Femoral Vein: No evidence of thrombus. Normal compressibility, respiratory phasicity and response to augmentation. Saphenofemoral Junction: No evidence of thrombus. Normal compressibility and flow on color Doppler imaging. Profunda Femoral Vein: No evidence of thrombus. Normal compressibility and flow  on color Doppler imaging. Femoral Vein: No evidence of thrombus. Normal compressibility, respiratory phasicity and response to augmentation. Popliteal Vein: No evidence of thrombus. Normal compressibility, respiratory phasicity and response to augmentation. Calf Veins: No evidence of thrombus. Normal compressibility and flow on color Doppler imaging. Superficial Great Saphenous Vein: No evidence of thrombus. Normal compressibility. Other Findings:  None. LEFT LOWER EXTREMITY Common Femoral Vein: No evidence of thrombus. Normal compressibility, respiratory phasicity and response to augmentation. Saphenofemoral Junction: No  evidence of thrombus. Normal compressibility and flow on color Doppler imaging. Profunda Femoral Vein: No evidence of thrombus. Normal compressibility and flow on color Doppler imaging. Femoral Vein: While the proximal and mid aspects of the left femoral vein appear widely patent, there is hypoechoic nonocclusive thrombus involving the distal aspect the left femoral vein (image 41) Popliteal Vein: There is hypoechoic near occlusive thrombus involving the left popliteal vein (images 44 and 45 Calf Veins: There is hypoechoic occlusive thrombus involving both paired left posterior tibial veins (image 50). The left peroneal vein appears patent where imaged. Superficial Great Saphenous Vein: No evidence of thrombus. Normal compressibility. Other Findings:  None. IMPRESSION: 1. The examination is positive for mixed occlusive and nonocclusive DVT extending from the distal aspect the left femoral vein through the level of the left posterior tibial veins. 2. No evidence of DVT within the right lower extremity. Electronically Signed   By: Sandi Mariscal M.D.   On: 02/08/2021 15:10   ECHOCARDIOGRAM COMPLETE  Result Date: 02/08/2021    ECHOCARDIOGRAM REPORT   Patient Name:   Jamie Matthews Date of Exam: 02/08/2021 Medical Rec #:  [id number]                     Height:       62.0 in Accession #:     [id number]                    Weight:       137.0 lb Date of Birth:  06-Jul-1940                      BSA:          1.628 m Patient Age:    34 years                      BP:           114/45 mmHg Patient Gender: F                             HR:           62 bpm. Exam Location:  [facility] Procedure: 2D Echo, Color Doppler and Cardiac Doppler Indications:     Elevated troponin  History:         Patient has prior history of Echocardiogram examinations, most                  recent 10/10/2018. Signs/Symptoms:Murmur; Risk                  Factors:Dyslipidemia.  Sonographer:     Charmayne Sheer Referring Phys:  [id number] Rhetta Mura Diagnosing Phys: Ida Rogue MD IMPRESSIONS  1. Left ventricular ejection fraction, by estimation, is 60 to 65%. The left ventricle has normal function. The left ventricle has no regional wall motion abnormalities. Left ventricular diastolic parameters are consistent with Grade II diastolic dysfunction (pseudonormalization).  2. Right ventricular systolic function is normal. The right ventricular size is normal. There is moderately elevated pulmonary artery systolic pressure. The estimated right ventricular systolic pressure is 82.4 mmHg.  3. The mitral valve is normal in structure. No evidence of mitral valve regurgitation. No evidence of mitral stenosis.  4. Tricuspid valve regurgitation is moderate. FINDINGS  Left Ventricle: Left ventricular ejection fraction, by estimation, is 60 to 65%. The left ventricle has normal function. The  left ventricle has no regional wall motion abnormalities. The left ventricular internal cavity size was normal in size. There is  no left ventricular hypertrophy. Left ventricular diastolic parameters are consistent with Grade II diastolic dysfunction (pseudonormalization). Right Ventricle: The right ventricular size is normal. No increase in right ventricular wall thickness. Right ventricular systolic function is normal. There is moderately elevated pulmonary artery  systolic pressure. The tricuspid regurgitant velocity is 3.56 m/s, and with an assumed right atrial pressure of 5 mmHg, the estimated right ventricular systolic pressure is 32.2 mmHg. Left Atrium: Left atrial size was normal in size. Right Atrium: Right atrial size was normal in size. Pericardium: There is no evidence of pericardial effusion. Mitral Valve: The mitral valve is normal in structure. No evidence of mitral valve regurgitation. No evidence of mitral valve stenosis. MV peak gradient, 6.1 mmHg. The mean mitral valve gradient is 2.0 mmHg. Tricuspid Valve: The tricuspid valve is normal in structure. Tricuspid valve regurgitation is moderate . No evidence of tricuspid stenosis. Aortic Valve: The aortic valve is normal in structure. Aortic valve regurgitation is not visualized. Mild to moderate aortic valve sclerosis/calcification is present, without any evidence of aortic stenosis. Aortic valve mean gradient measures 10.2 mmHg.  Aortic valve peak gradient measures 18.7 mmHg. Aortic valve area, by VTI measures 2.13 cm. Pulmonic Valve: The pulmonic valve was normal in structure. Pulmonic valve regurgitation is not visualized. No evidence of pulmonic stenosis. Aorta: The aortic root is normal in size and structure. Venous: The inferior vena cava is normal in size with greater than 50% respiratory variability, suggesting right atrial pressure of 3 mmHg. IAS/Shunts: No atrial level shunt detected by color flow Doppler.  LEFT VENTRICLE PLAX 2D LVIDd:         4.00 cm  Diastology LVIDs:         2.50 cm  LV e' medial:    7.94 cm/s LV PW:         1.00 cm  LV E/e' medial:  12.7 LV IVS:        0.70 cm  LV e' lateral:   8.92 cm/s LVOT diam:     2.10 cm  LV E/e' lateral: 11.3 LV SV:         99 LV SV Index:   61 LVOT Area:     3.46 cm  LEFT ATRIUM           Index       RIGHT ATRIUM           Index LA diam:      3.40 cm 2.09 cm/m  RA Area:     13.30 cm LA Vol (A4C): 43.1 ml 26.48 ml/m RA Volume:   35.60 ml  21.87 ml/m   AORTIC VALVE                    PULMONIC VALVE AV Area (Vmax):    2.32 cm     PV Vmax:       1.25 m/s AV Area (Vmean):   2.11 cm     PV Vmean:      88.700 cm/s AV Area (VTI):     2.13 cm     PV VTI:        0.272 m AV Vmax:           216.25 cm/s  PV Peak grad:  6.2 mmHg AV Vmean:          152.750 cm/s PV Mean grad:  3.0 mmHg  AV VTI:            0.465 m AV Peak Grad:      18.7 mmHg AV Mean Grad:      10.2 mmHg LVOT Vmax:         145.00 cm/s LVOT Vmean:        93.000 cm/s LVOT VTI:          0.286 m LVOT/AV VTI ratio: 0.61  AORTA Ao Root diam: 3.10 cm MITRAL VALVE                TRICUSPID VALVE MV Area (PHT): 2.73 cm     TR Peak grad:   50.7 mmHg MV Area VTI:   2.24 cm     TR Vmax:        356.00 cm/s MV Peak grad:  6.1 mmHg MV Mean grad:  2.0 mmHg     SHUNTS MV Vmax:       1.23 m/s     Systemic VTI:  0.29 m MV Vmean:      69.8 cm/s    Systemic Diam: 2.10 cm MV Decel Time: 278 msec MV E velocity: 101.00 cm/s MV A velocity: 96.00 cm/s MV E/A ratio:  1.05 Ida Rogue MD Electronically signed by Ida Rogue MD Signature Date/Time: 02/08/2021/2:20:10 PM    Final       Deonne Rooks T. [place]  If 7PM-7AM, please contact night-coverage [email or web address] 02/09/2021, 11:43 AM

## 2021-02-09 NOTE — Progress Notes (Signed)
ANTICOAGULATION CONSULT NOTE  Pharmacy Consult for heparin Indication: pulmonary embolus  Allergies  Allergen Reactions   Sulfa Antibiotics Rash    Patient Measurements: Height: 5\' 2"  (157.5 cm) Weight: 77.7 kg (171 lb 4.8 oz) IBW/kg (Calculated) : 50.1 Heparin Dosing Weight: 62 kg  Vital Signs: Temp: 98.2 F (36.8 C) (09/17 0401) Temp Source: Oral (09/17 0026) BP: 123/49 (09/17 0401) Pulse Rate: 57 (09/17 0401)  Labs: Recent Labs    02/07/21 1449 02/07/21 1449 02/07/21 2334 02/08/21 0332 02/08/21 0515 02/08/21 1345 02/08/21 2126 02/09/21 0519  HGB 9.7*  --   --  9.1*  --   --   --  9.4*  HCT 28.6*  --   --  26.4*  --   --   --  27.9*  PLT 415*  --   --  380  --   --   --  463*  APTT 31  --   --   --   --   --   --   --   LABPROT 14.6  --   --   --   --   --   --   --   INR 1.1  --   --   --   --   --   --   --   HEPARINUNFRC  --    < >  --  0.78*  --  0.56 0.59 0.48  CREATININE 1.30*  --   --  1.02*  --   --   --   --   TROPONINIHS 85*  --  130*  --  139*  --   --   --    < > = values in this interval not displayed.     Estimated Creatinine Clearance: 41 mL/min (A) (by C-G formula based on SCr of 1.02 mg/dL (H)).   Medical History: Past Medical History:  Diagnosis Date   AKI (acute kidney injury) ([place]) 08/17/2018   Anxiety    Arthritis    Belching    Bladder disorder    OVERACTIVE   Bowel dysfunction    BLOCKAGE   Cancer (HCC)    breast   Constipation    Depression    Diverticulitis    Fibromyalgia    GERD (gastroesophageal reflux disease)    Hyperlipidemia    IBS (irritable bowel syndrome)    Internal hemorrhoids    Lung cancer ([place]) 2020   Memory deficits    Murmur    asymptomatic   Pneumonia 11/18/12   Urinary incontinence    Vertigo      Assessment: 80 year old female presented with SOB. CTA positive all three right lobe and left lower lobe segmental and subsegmental pulmonary emboli. No anticoagulation note PTA. Pharmacy consult  for heparin management.  Date Time HL Rate/Comment  09/16 0332 0.78 Supratherapeutic 950 >850  units/hr 09/16 1345 0.56 Therapeutic x1, keep rate  09/16   2126    0.59    Therapeutic X 2  09/17   0519    0.48    Therapeutic X 3  Goal of Therapy:  Heparin level 0.3-0.7 units/ml Monitor platelets by anticoagulation protocol: Yes   Plan:  9/17:  HL @ 0519 = 0.48 Will continue pt on current rate and recheck HL on 9/18 with AM labs.   Jamie Matthews, PharmD 02/09/2021 6:36 AM

## 2021-02-09 NOTE — Evaluation (Signed)
Occupational Therapy Evaluation Patient Details Name: Jamie Matthews MRN: [id number] DOB: 05-Feb-1941 Today's Date: 02/09/2021   History of Present Illness Pt is an 80 y/o F with PMH: L breast cancer stage Ib, primary adenocarcinoma of the lung stage IVa, hypothyroid, acid reflux, hyperlipidemia, depression, memory decline, aortic atherosclerosis. Pt with adm 1 MA d/t weakness and hypoxia. Pt adm this hospitalization d/t pulmonary embolism, AKI, acute urinary retention and constipation. Started on heparin gtt, now switched to eliquis. Therapy okay'ed by MD/RN although pt not on IV ACs for 48hrs after imaging completed.   Clinical Impression   Pt seen for OT evaluation this date in setting of acute hospitalization d/t PE. MD okay'ed therapy intervention on date of eval. Pt is poor historian d/t confusion, she reports living in [facility] with basement with her spouse and being INDEP for ADLs/ADL mobility at baseline. Pt presents this date with decreased activity tolerance, decreased balance and generalized weakness. Pt requires MOD A for bed mobility and MIN/MOD A for transfers with RW for UE support. Pt only tolerates standing ~45 second increments during fxl activity tasks standing sink side before her knees start shaking and she requests seated rest. Pt requires MOD A for standing peri care after BM and MOD A for standing LB bathing sink side. She is only able to tolerate ~5-6 steps before becoming weak and fatigued and requiring increased assist of MOD/MAX A to safely descent to chair. Pt left in chair with chair alarm, all needs met and in reach. RN notified of session contents. Recommend STR f/u OT services in SNF d/t significant weakness dn functional decline.      Recommendations for follow up therapy are one component of a multi-disciplinary discharge planning process, led by the attending physician.  Recommendations may be updated based on patient status, additional functional criteria and  insurance authorization.   Follow Up Recommendations  SNF    Equipment Recommendations  3 in 1 bedside commode;Tub/shower seat;Other (comment) (2ww)    Recommendations for Other Services       Precautions / Restrictions Precautions Precautions: Fall Restrictions Weight Bearing Restrictions: No      Mobility Bed Mobility Overal bed mobility: Needs Assistance Bed Mobility: Supine to Sit     Supine to sit: Min assist;HOB elevated          Transfers Overall transfer level: Needs assistance Equipment used: Rolling walker (2 wheeled) Transfers: Sit to/from Stand Sit to Stand: Min assist;Mod assist         General transfer comment: cues for hand placement repeatedly with limited demostration of carryover or understanding of safe sequencing of RW use.    Balance Overall balance assessment: Needs assistance   Sitting balance-Leahy Scale: Fair       Standing balance-Leahy Scale: Poor                             ADL either performed or assessed with clinical judgement   ADL                                         General ADL Comments: SETUP to MIN A for seated UB ADLs, MOD A for LB ADLs including peri care and Lb bathing/dressing. Pt requires MIN/MOD A for ADL transfers and MIN A for fxl mobility with RW. Only tolerates standing in ~45second increments before requriing  rest and noted to have knees shaking d/t fatigue. Pt only able to take ~5-6 steps before starting to lose balance d/t fatigue/low tolerance.     Vision Patient Visual Report: No change from baseline       Perception     Praxis      Pertinent Vitals/Pain Pain Assessment: No/denies pain     Hand Dominance Right   Extremity/Trunk Assessment Upper Extremity Assessment Upper Extremity Assessment: Overall WFL for tasks assessed;Generalized weakness (ROM WFL, MMT grossly 4-/5)   Lower Extremity Assessment Lower Extremity Assessment: Overall WFL for tasks  assessed;Generalized weakness       Communication Communication Communication: No difficulties   Cognition Arousal/Alertness: Awake/alert Behavior During Therapy: WFL for tasks assessed/performed Overall Cognitive Status: No family/caregiver present to determine baseline cognitive functioning                                 General Comments: Pt oriented to place, self and year, but not month, day/date or situation. She is able to follow all commands with increased processing time. Unsure of baseline. Perseverates on needing to use bedpan/BSC even while actively using.   General Comments       Exercises Other Exercises Other Exercises: OT ed re: safety considerations, use of call light, importance of OOB Activity. Minimal reception of education, requires onging efforts.   Shoulder Instructions      Home Living Family/patient expects to be discharged to:: Private residence Living Arrangements: Spouse/significant other Available Help at Discharge: Family;Available 24 hours/day Type of Home: House Home Access: Stairs to enter [employer] of Steps: threshold   Home Layout: One level               Home Equipment: [place] - 2 wheels;Cane - single point;Wheelchair - manual;Shower seat          Prior Functioning/Environment Level of Independence: Independent with assistive device(s)        Comments: Pt is a poor historian. Most information collected from previous therapy evaluations. Pt reports having RW, but only using intermittently. States she is INDEP for ADLs. States she performs IADLs with her spouse and that they have someone come assist with laundry.        OT Problem List: Decreased strength;Decreased activity tolerance;Impaired balance (sitting and/or standing);Cardiopulmonary status limiting activity;Decreased safety awareness;Decreased knowledge of use of DME or AE;Decreased cognition      OT Treatment/Interventions: Self-care/ADL  training;Therapeutic exercise;Energy conservation;DME and/or AE instruction;Patient/family education;Balance training;Therapeutic activities    OT Goals(Current goals can be found in the care plan section) Acute Rehab OT Goals Patient Stated Goal: to get better OT Goal Formulation: With patient Time For Goal Achievement: 02/23/21 Potential to Achieve Goals: Good ADL Goals Pt Will Perform Upper Body Bathing: Independently;sitting Pt Will Perform Lower Body Bathing: with supervision;sit to/from stand (with LRAD for standing balance PRN) Pt Will Perform Lower Body Dressing: with supervision;sit to/from stand;with adaptive equipment Pt Will Transfer to Toilet: with supervision;ambulating;bedside commode (with LRAD to [facility] ~15-20' away to improve tolerance for fxl HH distances) Pt Will Perform Toileting - Clothing Manipulation and hygiene: with supervision;sit to/from stand Pt/caregiver will Perform Home Exercise Program: Increased strength;Both right and left upper extremity;With Supervision  OT Frequency: Min 2X/week   Barriers to D/C:            Co-evaluation              AM-PAC OT "6 Clicks" Daily Activity  Outcome Measure Help from another person eating meals?: None Help from another person taking care of personal grooming?: A Little Help from another person toileting, which includes using toliet, bedpan, or urinal?: A Lot Help from another person bathing (including washing, rinsing, drying)?: A Lot Help from another person to put on and taking off regular upper body clothing?: A Little Help from another person to put on and taking off regular lower body clothing?: A Lot 6 Click Score: 16   End of Session Equipment Utilized During Treatment: Gait belt;Rolling walker Nurse Communication: Mobility status  Activity Tolerance: Patient tolerated treatment well Patient left: in chair;with call bell/phone within reach;with chair alarm set  OT Visit Diagnosis: Unsteadiness on  feet (R26.81);Muscle weakness (generalized) (M62.81)                Time: 6924-9324 OT Time Calculation (min): 58 min Charges:  OT General Charges $OT Visit: 1 Visit OT Evaluation $OT Eval Moderate Complexity: 1 Mod OT Treatments $Self Care/Home Management : 23-37 mins $Therapeutic Activity: 8-22 mins  Gerrianne Scale, MS, OTR/L ascom [phone] 02/09/21, 12:57 PM

## 2021-02-10 DIAGNOSIS — R338 Other retention of urine: Secondary | ICD-10-CM | POA: Diagnosis not present

## 2021-02-10 DIAGNOSIS — K5900 Constipation, unspecified: Secondary | ICD-10-CM | POA: Diagnosis not present

## 2021-02-10 DIAGNOSIS — E039 Hypothyroidism, unspecified: Secondary | ICD-10-CM | POA: Diagnosis not present

## 2021-02-10 DIAGNOSIS — I2699 Other pulmonary embolism without acute cor pulmonale: Secondary | ICD-10-CM | POA: Diagnosis not present

## 2021-02-10 LAB — RENAL FUNCTION PANEL
Albumin: 1.9 g/dL — ABNORMAL LOW (ref 3.5–5.0)
Anion gap: 8 (ref 5–15)
BUN: 20 mg/dL (ref 8–23)
CO2: 28 mmol/L (ref 22–32)
Calcium: 7 mg/dL — ABNORMAL LOW (ref 8.9–10.3)
Chloride: 102 mmol/L (ref 98–111)
Creatinine, Ser: 0.95 mg/dL (ref 0.44–1.00)
GFR, Estimated: 60 mL/min — ABNORMAL LOW (ref >60)
Glucose, Bld: 117 mg/dL — ABNORMAL HIGH (ref 70–99)
Phosphorus: 2.7 mg/dL (ref 2.5–4.6)
Potassium: 3.7 mmol/L (ref 3.5–5.1)
Sodium: 138 mmol/L (ref 135–145)

## 2021-02-10 LAB — CBC
HCT: 28 % — ABNORMAL LOW (ref 36.0–46.0)
Hemoglobin: 9.2 g/dL — ABNORMAL LOW (ref 12.0–15.0)
MCH: 30.7 pg (ref 26.0–34.0)
MCHC: 32.9 g/dL (ref 30.0–36.0)
MCV: 93.3 fL (ref 80.0–100.0)
Platelets: 420 10*3/uL — ABNORMAL HIGH (ref 150–400)
RBC: 3 MIL/uL — ABNORMAL LOW (ref 3.87–5.11)
RDW: 14.8 % (ref 11.5–15.5)
WBC: 10.9 10*3/uL — ABNORMAL HIGH (ref 4.0–10.5)
nRBC: 0.8 % — ABNORMAL HIGH (ref 0.0–0.2)

## 2021-02-10 LAB — ACTH STIMULATION, 3 TIME POINTS
Cortisol, 30 Min: 22.3 ug/dL
Cortisol, 60 Min: 29.6 ug/dL
Cortisol, Base: 10.6 ug/dL

## 2021-02-10 LAB — MAGNESIUM: Magnesium: 2.6 mg/dL — ABNORMAL HIGH (ref 1.7–2.4)

## 2021-02-10 MED ORDER — POTASSIUM CHLORIDE CRYS ER 20 MEQ PO TBCR
40.0000 meq | EXTENDED_RELEASE_TABLET | Freq: Once | ORAL | Status: AC
  Administered 2021-02-10: 40 meq via ORAL
  Filled 2021-02-10: qty 2

## 2021-02-10 MED ORDER — FUROSEMIDE 10 MG/ML IJ SOLN
40.0000 mg | Freq: Once | INTRAMUSCULAR | Status: AC
  Administered 2021-02-10: 40 mg via INTRAVENOUS
  Filled 2021-02-10: qty 4

## 2021-02-10 MED ORDER — ALBUMIN HUMAN 25 % IV SOLN
25.0000 g | Freq: Four times a day (QID) | INTRAVENOUS | Status: AC
  Administered 2021-02-10 (×2): 25 g via INTRAVENOUS
  Filled 2021-02-10 (×3): qty 100

## 2021-02-10 NOTE — Progress Notes (Signed)
PROGRESS NOTE  Jamie Matthews GGE:[id number] DOB: Oct 31, 1940   PCP: Venia Carbon, MD  Patient is from: SNF  DOA: 02/07/2021 LOS: 3  Chief complaints:  Shortness of breath  Brief Narrative / Interim history: 80 year old F with history of lung cancer, breast cancer, chronic hypoxic respiratory failure on 2 L, hypothyroidism, incontinence and anemia of chronic disease presenting with progressive shortness of breath, wheeze and work of breathing for 2 days, and admitted for acute pulmonary embolism, AKI, acute urinary retention and constipation.  CTA chest showed bilateral segmental and subsegmental PE in right lung and LLL without evidence of right heart strain.  She was a started on IV heparin.  TTE with LVEF of 60 to 65%, G2 DD and RVSP of 55.7.  LE [place] positive for left distal femoral vein and popliteal vein DVT.  She was transitioned to starter pack Eliquis.  Evaluated by her oncologist as well.    In regards to acute urinary retention, she had 1.9 L UOP with I and O cath.  Foley catheter inserted on 02/08/2021.  Myrbetriq held on admission.  Recommend voiding trial in about 10 days.  If she fails voiding trial, she needs outpatient urology follow-up.  Constipation has resolved with bowel regimen.    Patient has soft blood pressure.  A.m. cortisol was very low but within normal range we will repeat the next day.  Started IV Lasix with albumin for lower extremity edema.   Subjective: Seen and examined earlier this morning.  No major events overnight of this morning.  She says her breathing is a little labored today.  Also feels tired.  Denies chest pain, GI or UTI symptoms.  Objective: Vitals:   02/09/21 2311 02/10/21 0319 02/10/21 0822 02/10/21 1213  BP: (!) 137/53 134/63 139/62 (!) 137/58  Pulse: 71 65 66 62  Resp: 18 18 17 18   Temp: 97.6 F (36.4 C) 98.1 F (36.7 C) 98.3 F (36.8 C) 98 F (36.7 C)  TempSrc:      SpO2: 90% 94% 93% 96%  Weight:      Height:         Intake/Output Summary (Last 24 hours) at 02/10/2021 1535 Last data filed at 02/10/2021 1401 Gross per 24 hour  Intake 0 ml  Output 2250 ml  Net -2250 ml   Filed Weights   02/07/21 1436 02/09/21 0515  Weight: 62.1 kg 77.7 kg    Examination:  GENERAL: No apparent distress.  Nontoxic. HEENT: MMM.  Vision and hearing grossly intact.  NECK: Supple.  No apparent JVD.  RESP: 94% on 1.5 L.  No IWOB.  Fair aeration bilaterally. CVS:  RRR. Heart sounds normal.  ABD/GI/GU: BS+. Abd soft, NTND.  Indwelling Foley MSK/EXT:  Moves extremities. No apparent deformity.  2+ pitting edema in BLE SKIN: no apparent skin lesion or wound NEURO: Awake and alert. Oriented appropriately.  No apparent focal neuro deficit. PSYCH: Calm. Normal affect.   Procedures:  None  Microbiology summarized: [id number] and influenza PCR nonreactive.  Assessment & Plan: Acute bilateral segmental and subsegmental PE/left lower extremity DVT-no CT evidence of right heart strain but elevated BNP, troponin and elevated RVSP on echocardiogram.  Unclear if the elevated RVSP is acute or chronic. -Transitioned to starter pack Eliquis on 9/17. -Needs anticoagulation indefinitely -Appreciate visit by her oncologist.   Hypotension-resolved with midodrine. PAH could contribute. TSH within normal. Initial a.m. cortisol low at 2.1 but improved to 10 on repeat.  ACTH stimulation test argues against primary and  secondary AI. -Continue midodrine -Follow ACTH level-expect to be normal  Bilateral lower extremity edema-multifactorial including malignancy, hypoalbuminemia and PAH.  TSH within normal.  Anemia not low enough to cause this. -Trial of IV Lasix with IV albumin -Closely monitor intake and output, and renal function -Reassess and redose Lasix   AKI: Likely prerenal from hypotension.  No hydro on CT abdomen despite significant bladder over distention, 1.9 L on I and O.  Resolved. Recent Labs    11/27/20 1442  12/28/20 1445 01/08/21 0837 01/09/21 0507 01/16/21 1342 02/05/21 1328 02/07/21 1449 02/08/21 0332 02/09/21 0519 02/10/21 0526  BUN 23 24* 29* 26* 31* 24* 22 20 22 20   CREATININE 1.21* 1.42* 1.23* 1.14* 0.93 1.08* 1.30* 1.02* 0.97 0.95  -Indwelling Foley catheter for 10 days for bladder rest -Continue midodrine as above -Monitor renal function   Chronic respiratory failure with hypoxia: Stable on home 2 L. -Continue home supplemental oxygen   History of lung cancer History of breast cancer -Continue home Crizotinib for lung cancer -Continue home Arimidex for breast cancer -Appreciate input by oncology   Acute urinary retention with bladder over distention: She had 1.9 L UOP on I&O cath.  No hydronephrosis or hydroureter on CT abdomen and pelvis. -Indwelling Foley catheter for 10 days and voiding trial at [facility] -Outpatient urology follow-up if she fails voiding trial -Discontinue Myrbetriq   Constipation: Resolved. -Continue home Amitiza. -Change MiraLAX and Senokot-S2 as needed   Elevated troponin-likely demand ischemia and delayed clearance.  No chest pain.  No acute ischemic finding on EKG. TTE as above.   Hyponatremia: Resolved.   Hypocalcemia-due to malignancy?  Vitamin D 67.  Improved. -Continue p.o. Os-Cal   Anemia of chronic disease: Stable after initial drop likely from hemodilution.  Anemia panel basically normal. Recent Labs    10/15/20 1334 11/27/20 1442 12/28/20 1445 01/08/21 0837 01/09/21 0507 01/16/21 1342 02/07/21 1449 02/08/21 0332 02/09/21 0519 02/10/21 0526  HGB 12.3 13.2 12.1 11.5* 10.8* 11.1* 9.7* 9.1* 9.4* 9.2*  -Monitor intermittently   Depression-Per med rec, no longer takes Wellbutrin. -Continue home Effexor  Generalized weakness/physical deconditioning -PT/OT  Body mass index is 31.33 kg/m.         DVT prophylaxis:  SCDs Start: 02/07/21 1858 apixaban (ELIQUIS) tablet 10 mg  apixaban (ELIQUIS) tablet 5 mg  Code Status:  DNR/DNI Family Communication: Patient and patient's RN. Level of care: Progressive Cardiac.  Status is: Inpatient  Remains inpatient appropriate because:Ongoing diagnostic testing needed not appropriate for outpatient work up, Unsafe d/c plan, IV treatments appropriate due to intensity of illness or inability to take PO, and Inpatient level of care appropriate due to severity of illness  Dispo: The patient is from: SNF              Anticipated d/c is to: SNF              Patient currently is not medically stable to d/c.   Difficult to place patient No       Consultants:  Oncology   Sch Meds:  Scheduled Meds:  anastrozole  1 mg Oral Daily   apixaban  10 mg Oral BID   Followed by   Derrill Memo ON 02/16/2021] apixaban  5 mg Oral BID   calcium carbonate  1 tablet Oral Q breakfast   crizotinib  250 mg Oral BID   fluticasone  2 spray Each Nare Daily   levothyroxine  25 mcg Oral Daily   lubiprostone  24 mcg Oral BID WC  midodrine  5 mg Oral TID WC   pantoprazole  40 mg Oral Daily   sodium chloride flush  10 mL Intravenous Q12H   Continuous Infusions:  albumin human 25 g (02/10/21 1133)   PRN Meds:.acetaminophen **OR** acetaminophen, albuterol, alum & mag hydroxide-simeth, benzonatate, polyethylene glycol, senna-docusate  Antimicrobials: Anti-infectives (From admission, onward)    None        I have personally reviewed the following labs and images: CBC: Recent Labs  Lab 02/07/21 1449 02/08/21 0332 02/09/21 0519 02/10/21 0526  WBC 11.7* 8.1 14.3* 10.9*  HGB 9.7* 9.1* 9.4* 9.2*  HCT 28.6* 26.4* 27.9* 28.0*  MCV 93.2 92.6 93.3 93.3  PLT 415* 380 463* 420*   BMP &GFR Recent Labs  Lab 02/05/21 1328 02/07/21 1449 02/08/21 0332 02/09/21 0519 02/10/21 0526  NA 133* 134* 134* 137 138  K 3.9 3.6 3.7 3.6 3.7  CL 96* 96* 98 102 102  CO2 29 30 26 28 28   GLUCOSE 163* 182* 215* 112* 117*  BUN 24* 22 20 22 20   CREATININE 1.08* 1.30* 1.02* 0.97 0.95  CALCIUM 6.7*  6.2* 6.4* 6.9* 7.0*  MG  --  2.7* 2.8* 3.0* 2.6*  PHOS  --   --  3.1 2.9 2.7   Estimated Creatinine Clearance: 44 mL/min (by C-G formula based on SCr of 0.95 mg/dL). Liver & Pancreas: Recent Labs  Lab 02/07/21 1449 02/08/21 0332 02/09/21 0519 02/10/21 0526  AST 42* 36  --   --   ALT 37 34  --   --   ALKPHOS 95 90  --   --   BILITOT 0.6 0.8  --   --   PROT 5.2* 4.8*  --   --   ALBUMIN 2.0* 1.9* 2.0* 1.9*   No results for input(s): LIPASE, AMYLASE in the last 168 hours. No results for input(s): AMMONIA in the last 168 hours. Diabetic: No results for input(s): HGBA1C in the last 72 hours. Recent Labs  Lab 02/08/21 0743 02/08/21 1729  GLUCAP 172* 138*   Cardiac Enzymes: Recent Labs  Lab 02/09/21 0519  CKTOTAL 160   No results for input(s): PROBNP in the last 8760 hours. Coagulation Profile: Recent Labs  Lab 02/07/21 1449  INR 1.1   Thyroid Function Tests: Recent Labs    02/08/21 0332  TSH 1.607   Lipid Profile: No results for input(s): CHOL, HDL, LDLCALC, TRIG, CHOLHDL, LDLDIRECT in the last 72 hours. Anemia Panel: Recent Labs    02/09/21 0519  VITAMINB12 1,138*  FOLATE 8.3  FERRITIN 697*  TIBC 181*  IRON 110  RETICCTPCT 2.7   Urine analysis:    Component Value Date/Time   COLORURINE YELLOW (A) 01/08/2021 1300   APPEARANCEUR HAZY (A) 01/08/2021 1300   APPEARANCEUR Clear 09/23/2019 1103   LABSPEC 1.021 01/08/2021 1300   LABSPEC 1.027 06/10/2014 1759   PHURINE 7.0 01/08/2021 1300   GLUCOSEU NEGATIVE 01/08/2021 1300   GLUCOSEU Negative 06/10/2014 1759   HGBUR NEGATIVE 01/08/2021 1300   BILIRUBINUR NEGATIVE 01/08/2021 1300   BILIRUBINUR Negative 09/23/2019 1103   BILIRUBINUR Negative 06/10/2014 1759   KETONESUR NEGATIVE 01/08/2021 1300   PROTEINUR NEGATIVE 01/08/2021 1300   UROBILINOGEN 0.2 08/20/2017 1134   NITRITE NEGATIVE 01/08/2021 1300   LEUKOCYTESUR NEGATIVE 01/08/2021 1300   LEUKOCYTESUR Trace 06/10/2014 1759   Sepsis Labs: Invalid  input(s): PROCALCITONIN, [place]  Microbiology: Recent Results (from the past 240 hour(s))  Resp Panel by RT-PCR (Flu A&B, Covid) Nasopharyngeal Swab     Status: None  Collection Time: 02/07/21  3:57 PM   Specimen: Nasopharyngeal Swab; Nasopharyngeal(NP) swabs in vial transport medium  Result Value Ref Range Status   SARS Coronavirus 2 by RT PCR NEGATIVE NEGATIVE Final    Comment: (NOTE) SARS-CoV-2 target nucleic acids are NOT DETECTED.  The SARS-CoV-2 RNA is generally detectable in upper respiratory specimens during the acute phase of infection. The lowest concentration of SARS-CoV-2 viral copies this assay can detect is 138 copies/mL. A negative result does not preclude SARS-Cov-2 infection and should not be used as the sole basis for treatment or other patient management decisions. A negative result may occur with  improper specimen collection/handling, submission of specimen other than nasopharyngeal swab, presence of viral mutation(s) within the areas targeted by this assay, and inadequate number of viral copies(<138 copies/mL). A negative result must be combined with clinical observations, patient history, and epidemiological information. The expected result is Negative.  Fact Sheet for Patients:  EntrepreneurPulse.com.au  Fact Sheet for Healthcare Providers:  IncredibleEmployment.be  This test is no t yet approved or cleared by the [place] FDA and  has been authorized for detection and/or diagnosis of SARS-CoV-2 by FDA under an Emergency Use Authorization (EUA). This EUA will remain  in effect (meaning this test can be used) for the duration of the COVID-19 declaration under Section 564(b)(1) of the Act, 21 U.S.C.section 360bbb-3(b)(1), unless the authorization is terminated  or revoked sooner.       Influenza A by PCR NEGATIVE NEGATIVE Final   Influenza B by PCR NEGATIVE NEGATIVE Final    Comment: (NOTE) The Xpert  Xpress SARS-CoV-2/FLU/RSV plus assay is intended as an aid in the diagnosis of influenza from Nasopharyngeal swab specimens and should not be used as a sole basis for treatment. Nasal washings and aspirates are unacceptable for Xpert Xpress SARS-CoV-2/FLU/RSV testing.  Fact Sheet for Patients: EntrepreneurPulse.com.au  Fact Sheet for Healthcare Providers: IncredibleEmployment.be  This test is not yet approved or cleared by the [place] FDA and has been authorized for detection and/or diagnosis of SARS-CoV-2 by FDA under an Emergency Use Authorization (EUA). This EUA will remain in effect (meaning this test can be used) for the duration of the COVID-19 declaration under Section 564(b)(1) of the Act, 21 U.S.C. section 360bbb-3(b)(1), unless the authorization is terminated or revoked.  Performed at [facility], [address] [place], [place] [address]     Radiology Studies: No results found.    Clancey Welton T. Tenafly  If 7PM-7AM, please contact night-coverage [email or web address] 02/10/2021, 3:35 PM

## 2021-02-10 NOTE — TOC CM/SW Note (Signed)
Went by room to discuss return to [facility] but patient asked CSW to come back later.   Jamie Matthews, [place]

## 2021-02-10 NOTE — Plan of Care (Signed)

## 2021-02-11 DIAGNOSIS — E039 Hypothyroidism, unspecified: Secondary | ICD-10-CM | POA: Diagnosis not present

## 2021-02-11 DIAGNOSIS — I2699 Other pulmonary embolism without acute cor pulmonale: Secondary | ICD-10-CM | POA: Diagnosis not present

## 2021-02-11 DIAGNOSIS — I824Y2 Acute embolism and thrombosis of unspecified deep veins of left proximal lower extremity: Secondary | ICD-10-CM | POA: Diagnosis not present

## 2021-02-11 DIAGNOSIS — R338 Other retention of urine: Secondary | ICD-10-CM | POA: Diagnosis not present

## 2021-02-11 LAB — RENAL FUNCTION PANEL
Albumin: 2.5 g/dL — ABNORMAL LOW (ref 3.5–5.0)
Anion gap: 3 — ABNORMAL LOW (ref 5–15)
BUN: 17 mg/dL (ref 8–23)
CO2: 29 mmol/L (ref 22–32)
Calcium: 7.4 mg/dL — ABNORMAL LOW (ref 8.9–10.3)
Chloride: 107 mmol/L (ref 98–111)
Creatinine, Ser: 0.88 mg/dL (ref 0.44–1.00)
GFR, Estimated: >60 mL/min (ref >60)
Glucose, Bld: 117 mg/dL — ABNORMAL HIGH (ref 70–99)
Phosphorus: 2.9 mg/dL (ref 2.5–4.6)
Potassium: 3.8 mmol/L (ref 3.5–5.1)
Sodium: 139 mmol/L (ref 135–145)

## 2021-02-11 LAB — CBC
HCT: 27.5 % — ABNORMAL LOW (ref 36.0–46.0)
Hemoglobin: 9.2 g/dL — ABNORMAL LOW (ref 12.0–15.0)
MCH: 31.8 pg (ref 26.0–34.0)
MCHC: 33.5 g/dL (ref 30.0–36.0)
MCV: 95.2 fL (ref 80.0–100.0)
Platelets: 459 10*3/uL — ABNORMAL HIGH (ref 150–400)
RBC: 2.89 MIL/uL — ABNORMAL LOW (ref 3.87–5.11)
RDW: 14.7 % (ref 11.5–15.5)
WBC: 11.8 10*3/uL — ABNORMAL HIGH (ref 4.0–10.5)
nRBC: 0.7 % — ABNORMAL HIGH (ref 0.0–0.2)

## 2021-02-11 LAB — ACTH: C206 ACTH: 16 pg/mL (ref 7.2–63.3)

## 2021-02-11 LAB — MAGNESIUM: Magnesium: 2.5 mg/dL — ABNORMAL HIGH (ref 1.7–2.4)

## 2021-02-11 LAB — RESP PANEL BY RT-PCR (FLU A&B, COVID) ARPGX2
Influenza A by PCR: NEGATIVE
Influenza B by PCR: NEGATIVE
SARS Coronavirus 2 by RT PCR: NEGATIVE

## 2021-02-11 LAB — CK: Total CK: 69 U/L (ref 38–234)

## 2021-02-11 MED ORDER — APIXABAN 5 MG PO TABS: ORAL_TABLET | ORAL | 0 refills | Status: DC

## 2021-02-11 NOTE — Plan of Care (Signed)

## 2021-02-11 NOTE — Progress Notes (Signed)
Occupational Therapy Treatment Patient Details Name: Jamie Matthews MRN: [id number] DOB: Jun 24, 1940 Today's Date: 02/11/2021   History of present illness Pt is an 80 y/o F with PMH: L breast cancer stage Ib, primary adenocarcinoma of the lung stage IVa, hypothyroid, acid reflux, hyperlipidemia, depression, memory decline, aortic atherosclerosis. Pt with adm 1 MA d/t weakness and hypoxia. Pt adm this hospitalization d/t pulmonary embolism, AKI, acute urinary retention and constipation. Started on heparin gtt, now switched to eliquis. Pt OK to participate with OT/PT 9/17 per MD secondary to imaging completed and reviewed.   OT comments  Chart reviewed, pt greeted in bed agreeable to OT tx session. Target of tx session included progressing endurance, strength, functional mobility to improve safe, independent ADL completion. Pt demonstrates an improvement in tolerance for ADL task, independence level as compared to previous visit. PT will continue to benefit from discharge to SNF To address functional deficits, improve safe ADL completion. Pt is left in bedside chair, NAD, all needs met. Pt is +chair alarm, nursing aware of status. OT will continue to follow while admitted.    Recommendations for follow up therapy are one component of a multi-disciplinary discharge planning process, led by the attending physician.  Recommendations may be updated based on patient status, additional functional criteria and insurance authorization.    Follow Up Recommendations  SNF    Equipment Recommendations  3 in 1 bedside commode;Tub/shower seat;Other (comment) (per next venue of care)    Recommendations for Other Services      Precautions / Restrictions Precautions Precautions: Fall Restrictions Weight Bearing Restrictions: No       Mobility Bed Mobility Overal bed mobility: Needs Assistance Bed Mobility: Sit to Supine     Supine to sit: Min assist;HOB elevated           Transfers Overall transfer level: Needs assistance Equipment used: Rolling walker (2 wheeled) Transfers: Sit to/from [facility] Sit to Stand: Min assist Stand pivot transfers: Min guard            Balance Overall balance assessment: Needs assistance   Sitting balance-Leahy Scale: Good     Standing balance support: Bilateral upper extremity supported;During functional activity Standing balance-Leahy Scale: Fair                             ADL either performed or assessed with clinical judgement   ADL Overall ADL's : Needs assistance/impaired                                       General ADL Comments: MOD A required for peri care, SET UP required for UB bathing, grooming in seated, MIN A required for UB dressing. Pt requires MIN A with RW for transfer to bedside commode, CGA for SPT transfer to bed side chair. Tolerated standing up to 1 minute on this date.                       Cognition Arousal/Alertness: Awake/alert Behavior During Therapy: WFL for tasks assessed/performed Overall Cognitive Status: No family/caregiver present to determine baseline cognitive functioning                                 General Comments: Pt is oriented to self, place, date, however appears  to present with decreased safety awareness during ADL completion                   Pertinent Vitals/ Pain       Pain Assessment: 0-10 Pain Score: 0-No pain   Frequency  Min 2X/week        Progress Toward Goals  OT Goals(current goals can now be found in the care plan section)  Progress towards OT goals: Progressing toward goals  Acute Rehab OT Goals Patient Stated Goal: to get better OT Goal Formulation: With patient Potential to Achieve Goals: Good  Plan Discharge plan remains appropriate;Frequency remains appropriate    Co-evaluation                 AM-PAC OT "6 Clicks" Daily Activity     Outcome  Measure   Help from another person eating meals?: None Help from another person taking care of personal grooming?: A Little Help from another person toileting, which includes using toliet, bedpan, or urinal?: A Lot Help from another person bathing (including washing, rinsing, drying)?: A Little Help from another person to put on and taking off regular upper body clothing?: A Little Help from another person to put on and taking off regular lower body clothing?: A Lot 6 Click Score: 17    End of Session Equipment Utilized During Treatment: Gait belt;Rolling walker  OT Visit Diagnosis: Unsteadiness on feet (R26.81);Muscle weakness (generalized) (M62.81)   Activity Tolerance Patient tolerated treatment well   Patient Left in chair;with call bell/phone within reach;with chair alarm set   Nurse Communication Mobility status        Time: 2763-9432 OT Time Calculation (min): 38 min  Charges: OT General Charges $OT Visit: 1 Visit OT Treatments $Self Care/Home Management : 38-52 mins Shanon Payor, OTD OTR/L  02/11/21, 11:13 AM

## 2021-02-11 NOTE — TOC Transition Note (Signed)
Transition of Care [facility]) - CM/SW Discharge Note   Patient Details  Name: Jamie Matthews MRN: [id number] Date of Birth: 02-05-1941  [facility] of [facility]) CM/SW Contact:  Eileen Stanford, LCSW Phone Number: 02/11/2021, 1:49 PM   Clinical Narrative: Clinical Social Worker facilitated patient discharge including contacting patient family and facility to confirm patient discharge plans.  Clinical information faxed to facility and family agreeable with plan.  CSW arranged ambulance transport via [facility] to [facility] (SNF)  .  RN to call [phone] for report prior to discharge.     Final next level of care: Skilled Nursing Facility Barriers to Discharge: No Barriers Identified   Patient Goals and CMS Choice        Discharge Placement              Patient chooses bed at: [facility] Patient to be transferred to facility by: [facility] Name of family member notified: pt's sister Patient and family notified of of transfer: 02/11/21  Discharge Plan and Services                                     Social Determinants of Health (SDOH) Interventions     Readmission Risk Interventions Readmission Risk Prevention Plan 10/10/2018  Post Dischage Appt Complete  Medication Screening Complete  Transportation Screening Complete  Some recent data might be hidden

## 2021-02-11 NOTE — Progress Notes (Signed)
Attempted to call report, no answer on unit once transferred by operator.

## 2021-02-11 NOTE — Care Management Important Message (Signed)
Important Message  Patient Details  Name: Jamie Matthews MRN: [id number] Date of Birth: 1941/02/06   Medicare Important Message Given:  Yes     Dannette Barbara 02/11/2021, 3:04 PM

## 2021-02-11 NOTE — TOC Progression Note (Addendum)
Transition of Care [facility]) - Progression Note    Patient Details  Name: Jamie Matthews MRN: [id number] Date of Birth: 11-Dec-1940  Transition of Care [facility]) CM/SW Sheldon, LCSW Phone Number: 02/11/2021, 12:20 PM  Clinical Narrative:   CSW spoke with Crystal at [facility] and she states pt does not have to have PASRR to return as pt was at the SNF prior to admission. CSW has notified RN and MD.  CSW unable to reach pt's spouse. CSW called pt's sister and she was very happy pt was dc back today. PT's sister is going to call facility and let pt's spouse know--she states he will be very happy.        Expected Discharge Plan and Services           Expected Discharge Date: 02/11/21                                     Social Determinants of Health (SDOH) Interventions    Readmission Risk Interventions Readmission Risk Prevention Plan 10/10/2018  Post Dischage Appt Complete  Medication Screening Complete  Transportation Screening Complete  Some recent data might be hidden

## 2021-02-11 NOTE — Discharge Summary (Signed)
Physician Discharge Summary  Jamie Matthews VQM:[id number] DOB: December 03, 1940 DOA: 02/07/2021  PCP: Venia Carbon, MD  Admit date: 02/07/2021 Discharge date: 02/11/2021 Admitted From: ALF Disposition: SNF Recommendations for Outpatient Follow-up:  Follow ups as below. Please obtain CBC/BMP/Mag at follow up Voiding trial in 1 week.  If she fails, reinsert indwelling Foley and follow-up with urology Please follow up on the following pending results: ACTH level  Discharge Condition: Stable CODE STATUS: DNR/do not  Hospital Course: 80 year old F with history of lung cancer, breast cancer, chronic hypoxic respiratory failure on 2 L, hypothyroidism, incontinence and anemia of chronic disease presenting with progressive shortness of breath, wheeze and work of breathing for 2 days, and admitted for acute pulmonary embolism, AKI, acute urinary retention and constipation.  CTA chest showed bilateral segmental and subsegmental PE in right lung and LLL without evidence of right heart strain.  She was a started on IV heparin.  TTE with LVEF of 60 to 65%, G2 DD and RVSP of 55.7.  LE [place] positive for left distal femoral vein and popliteal vein DVT.  She was transitioned to starter pack Eliquis.  Evaluated by her oncologist as well.    In regards to acute urinary retention, she had 1.9 L UOP with I and O cath.  Foley catheter inserted on 02/08/2021.  Myrbetriq held on admission.  Recommend voiding trial in about 10 days.  If she fails voiding trial, she needs outpatient urology follow-up.  Constipation has resolved with bowel regimen.     Patient has soft blood pressure.  A.m. cortisol was very low but within normal range we will repeat the next day.  Started IV Lasix with albumin for lower extremity edema.   See individual problem list below for more on hospital course.  Discharge Diagnoses:  Acute bilateral segmental and subsegmental PE/left lower extremity DVT-no CT evidence of right heart  strain but elevated BNP, troponin and elevated RVSP on echocardiogram.  Unclear if the elevated RVSP is acute or chronic. -Transitioned to starter pack Eliquis on 9/17. -Needs anticoagulation indefinitely -Appreciate visit by her oncologist.   Hypotension-resolved with midodrine. PAH could contribute. TSH within normal. Initial a.m. cortisol low at 2.1 but improved to 10 on repeat.  ACTH stimulation test argues against primary and secondary AI. -Continue midodrine 5 mg 3 times daily -Follow ACTH level-expect to be normal   Bilateral lower extremity edema-multifactorial including malignancy, hypoalbuminemia and PAH.  TSH within normal.  Anemia not low enough to cause this.  Improved with brief diuresis in combination with albumin -Encourage leg elevation   AKI: Likely prerenal from hypotension.  No hydro on CT abdomen despite significant bladder over distention, 1.9 L on I and O.  Resolved. -Indwelling Foley catheter for 10 days for bladder rest -Continue midodrine as above -Monitor renal function   Chronic respiratory failure with hypoxia: Stable on home 2 L. -Continue home supplemental oxygen   History of lung cancer History of breast cancer -Continue home Crizotinib for lung cancer -Continue home Arimidex for breast cancer -Appreciate input by oncology   Acute urinary retention with bladder over distention: She had 1.9 L UOP on I&O cath.  No hydronephrosis or hydroureter on CT abdomen and pelvis. -Indwelling Foley catheter for 10 days and voiding trial at [facility] -Outpatient urology follow-up if she fails voiding trial -Discontinued Myrbetriq   Constipation: Resolved. -Continue home Amitiza. -Add MiraLAX and Senokot-S as needed   Elevated troponin-likely demand ischemia and delayed clearance.  No chest pain.  No acute ischemic finding on EKG. TTE as above.   Hyponatremia: Resolved.   Hypocalcemia-due to malignancy?  Vitamin D 67.  Improved. -Continue p.o. Os-Cal   Anemia of  chronic disease: Stable after initial drop likely from hemodilution.  Anemia panel basically normal. Recent Labs    11/27/20 1442 12/28/20 1445 01/08/21 0837 01/09/21 0507 01/16/21 1342 02/07/21 1449 02/08/21 0332 02/09/21 0519 02/10/21 0526 02/11/21 0541  HGB 13.2 12.1 11.5* 10.8* 11.1* 9.7* 9.1* 9.4* 9.2* 9.2*  -Monitor CBC at follow-up.   Depression-Per med rec, no longer takes Wellbutrin. -Continue home Effexor   Generalized weakness/physical deconditioning -Continue PT/OT SNF. Body mass index is 29.07 kg/m.           Discharge Exam: Vitals:   02/11/21 0527 02/11/21 0829 02/11/21 1104 02/11/21 1203  BP:  (!) 152/66  122/71  Pulse:  63  61  Temp:  98.1 F (36.7 C)  98.4 F (36.9 C)  Resp:  17  17  Height:      Weight: 72.1 kg     SpO2:  93% 93% 96%  TempSrc:  Oral    BMI (Calculated): 29.07        GENERAL: No apparent distress.  Nontoxic. HEENT: MMM.  Vision and hearing grossly intact.  NECK: Supple.  No apparent JVD.  RESP:  No IWOB.  Fair aeration bilaterally. CVS:  RRR. Heart sounds normal.  ABD/GI/GU: Bowel sounds present. Soft. Non tender.  Indwelling Foley. MSK/EXT:  Moves extremities. No apparent deformity.  1+ pitting edema in BLE. SKIN: no apparent skin lesion or wound NEURO: Awake and alert.  Oriented appropriately.  No apparent focal neuro deficit. PSYCH: Calm. Normal affect.   Discharge Instructions  Discharge Instructions     Diet - low sodium heart healthy   Complete by: As directed    Increase activity slowly   Complete by: As directed       Allergies as of 02/11/2021       Reactions   Sulfa Antibiotics Rash        Medication List     STOP taking these medications    atorvastatin 20 MG tablet Commonly known as: LIPITOR   buPROPion 150 MG 24 hr tablet Commonly known as: WELLBUTRIN XL   docusate sodium 100 MG capsule Commonly known as: COLACE   mirabegron ER 50 MG Tb24 tablet Commonly known as: Myrbetriq    predniSONE 20 MG tablet Commonly known as: DELTASONE       TAKE these medications    acetaminophen 325 MG tablet Commonly known as: TYLENOL Take 325 mg by mouth every 6 (six) hours as needed for mild pain.   albuterol 108 (90 Base) MCG/ACT inhaler Commonly known as: VENTOLIN HFA Inhale 2 puffs into the lungs every 6 (six) hours as needed for wheezing or shortness of breath.   alum & mag hydroxide-simeth 200-200-20 MG/5ML suspension Commonly known as: MAALOX/MYLANTA Take 30 mLs by mouth every 6 (six) hours as needed for indigestion or heartburn.   anastrozole 1 MG tablet Commonly known as: ARIMIDEX Take 1 tablet (1 mg total) by mouth daily.   apixaban 5 MG Tabs tablet Commonly known as: ELIQUIS Take 2 tablets (10 mg total) by mouth 2 (two) times daily for 4 days, THEN 1 tablet (5 mg total) 2 (two) times daily. Start taking on: February 11, 2021   benzonatate 100 MG capsule Commonly known as: TESSALON TAKE 1 CAPSULE BY MOUTH 3 TIMES DAILY ASNEEDED FOR COUGH.   CALCIUM 1200+D3 PO  Take 1 tablet by mouth daily.   fluticasone 50 MCG/ACT nasal spray Commonly known as: FLONASE Place 2 sprays into both nostrils daily.   furosemide 40 MG tablet Commonly known as: LASIX Take 2 tablets (80 mg total) by mouth daily. What changed:  how much to take when to take this   levothyroxine 25 MCG tablet Commonly known as: SYNTHROID TAKE 1 TABLET BY MOUTH DAILY   lubiprostone 24 MCG capsule Commonly known as: Amitiza TAKE ONE CAPSULE TWICE A DAY WITH MEALS   memantine 10 MG tablet Commonly known as: NAMENDA Take 1 tablet (10 mg total) by mouth 2 (two) times daily.   midodrine 5 MG tablet Commonly known as: PROAMATINE Take 1 tablet (5 mg total) by mouth 3 (three) times daily with meals.   Mucinex DM 30-600 MG Tb12 Take 1 tablet by mouth 2 (two) times daily as needed (for congestion/cough).   multivitamin with minerals Tabs tablet Take 1 tablet by mouth daily.    omeprazole 40 MG capsule Commonly known as: PRILOSEC Take 40 mg by mouth daily.   ondansetron 8 MG tablet Commonly known as: ZOFRAN TAKE ONE TABLET EVERY EIGHT HOURS AS NEEDED FOR NAUSEA / VOMITING   polyethylene glycol 17 g packet Commonly known as: MIRALAX / GLYCOLAX Take 17 g by mouth 2 (two) times daily.   potassium chloride SA 20 MEQ tablet Commonly known as: KLOR-CON Take 20 mEq by mouth 2 (two) times daily.   senna-docusate 8.6-50 MG tablet Commonly known as: Senokot-S Take 1 tablet by mouth 2 (two) times daily between meals as needed for mild constipation.   venlafaxine XR 150 MG 24 hr capsule Commonly known as: EFFEXOR-XR TAKE 1 CAPSULE BY MOUTH EVERY DAY   Vitamin D (Ergocalciferol) 1.25 MG (50000 UNIT) Caps capsule Commonly known as: DRISDOL Take 50,000 Units by mouth every Sunday at [place] 250 MG capsule Generic drug: crizotinib Take 1 capsule (250 mg total) by mouth 2 (two) times daily.        Consultations: None  Procedures/Studies: DG Chest 2 View  Result Date: 02/07/2021 CLINICAL DATA:  Shortness of breath EXAM: CHEST - 2 VIEW COMPARISON:  Radiograph 01/08/2021, chest CT 01/08/2021 FINDINGS: Unchanged cardiomediastinal silhouette. There is no new focal airspace disease. Known faintly calcified nodule in the left apex is poorly visualized radiographically. Unchanged calcified more peripheral left apical calcified granuloma. There is no large pleural effusion or visible pneumothorax. Elevated right hemidiaphragm with colonic interposition. No acute osseous abnormality. Thoracic spondylosis. IMPRESSION: No new focal airspace disease. Electronically Signed   By: Maurine Simmering M.D.   On: 02/07/2021 16:01   CT HEAD WO CONTRAST (5MM)  Result Date: 02/07/2021 CLINICAL DATA:  Mental status change EXAM: CT HEAD WITHOUT CONTRAST TECHNIQUE: Contiguous axial images were obtained from the base of the skull through the vertex without intravenous contrast.  COMPARISON:  MRI 10/24/2020, CT brain 10/31/2018 FINDINGS: Brain: No acute territorial infarction, hemorrhage or intracranial mass. Atrophy and chronic small vessel ischemic changes of the white matter. Stable ventricle size Vascular: No hyperdense vessels.  Carotid vascular calcification Skull: Normal. Negative for fracture or focal lesion. Sinuses/Orbits: No acute finding. Other: None IMPRESSION: 1. No CT evidence for acute intracranial abnormality. 2. Atrophy and chronic small vessel ischemic changes of the white matter Electronically Signed   By: Donavan Foil M.D.   On: 02/07/2021 17:29   CT Angio Chest PE W and/or Wo Contrast  Result Date: 02/07/2021 CLINICAL DATA:  wheezes throughout, whenever pt taken  off any kind of O2, desaturates to 83%. Pt tachypnic. Abdominal distension EXAM: CT ANGIOGRAPHY CHEST CT ABDOMEN AND PELVIS WITH CONTRAST TECHNIQUE: Multidetector CT imaging of the chest was performed using the standard protocol during bolus administration of intravenous contrast. Multiplanar CT image reconstructions and MIPs were obtained to evaluate the vascular anatomy. Multidetector CT imaging of the abdomen and pelvis was performed using the standard protocol during bolus administration of intravenous contrast. CONTRAST:  66mL OMNIPAQUE IOHEXOL 350 MG/ML SOLN COMPARISON:  CT chest 03/23/2019 FINDINGS: CTA CHEST FINDINGS Cardiovascular: Satisfactory opacification of the pulmonary arteries to the segmental level. Pulmonary artery filling defects within the left lower lobe segmental and subsegmental pulmonary arteries, right upper lobe segmental and subsegmental pulmonary arteries, right lower lobe subsegmental pulmonary arteries, right middle lobe segmental and subsegmental pulmonary arteries. Some of these appear central and cords like but are not visualized on the prior CT angiography chest 01/08/2021. Normal heart size. No pericardial effusion. Atherosclerotic plaque of the thoracic aorta.  Mediastinum/Nodes: No enlarged mediastinal, hilar, or axillary lymph nodes. Thyroid gland and trachea demonstrate no significant findings. At least small to moderate hiatal hernia. Poor visualization of the esophagus due to timing of contrast. Lungs/Pleura: Hazy airspace opacity peripherally within the right upper lobe (6:45). Likely subsegmental atelectasis at the left lower lobe. Similar-appearing 2.2 x 2.3 cm partially calcified pulmonary nodule (interval decrease in size from PET-CT 06/21/2018). Calcified pulmonary nodule at the left apex (6:28). Left peribronchovascular calcifications likely representing old granulomatous disease (6:40, 57). Associated traction bronchiectasis and architectural distortion. No pulmonary mass. No pleural effusion. No pneumothorax. Musculoskeletal: No chest wall abnormality. No suspicious lytic or blastic osseous lesions. Old healed right rib fractures. No acute displaced fracture. Similar-appearing compression fractures of the T9 and T11 vertebral bodies. Multilevel degenerative changes of the spine. Degenerative changes of the visualized right hand. Review of the MIP images confirms the above findings. CT ABDOMEN and PELVIS FINDINGS Hepatobiliary: No focal liver abnormality. Status post cholecystectomy. No biliary dilatation. Pancreas: No focal lesion. Normal pancreatic contour. No surrounding inflammatory changes. No main pancreatic ductal dilatation. Spleen: Normal in size without focal abnormality. Adrenals/Urinary Tract: No adrenal nodule bilaterally. Bilateral kidneys enhance symmetrically. No hydronephrosis. No hydroureter. The urinary bladder is distended with urine. Stomach/Bowel: Stomach is within normal limits. No evidence of bowel wall thickening or dilatation. Stool throughout the colon. The appendix not definitely identified. Vascular/Lymphatic: No abdominal aorta or iliac aneurysm. Moderate to severe calcified and noncalcified atherosclerotic plaque of the aorta and  its branches. No abdominal, pelvic, or inguinal lymphadenopathy. Reproductive: Uterus and bilateral adnexa are unremarkable. Other: No intraperitoneal free fluid. No intraperitoneal free gas. No organized fluid collection. Musculoskeletal: Tiny fat containing umbilical hernia. No suspicious lytic or blastic osseous lesions. No acute displaced fracture. Multilevel degenerative changes of the spine. Review of the MIP images confirms the above findings. IMPRESSION: 1. All three right lobe and left lower lobe segmental and subsegmental pulmonary emboli. No findings of right heart strain. Possible developing pulmonary infarction in the right upper lobe. 2. At least small to moderate hiatal hernia. Poor visualization of the esophagus due to timing of contrast. 3. Similar-appearing 2.2 x 2.3 cm left apex lobe partially calcified pulmonary nodule (interval decrease in size from PET-CT 06/21/2018). 4. Constipation and urinary bladder distended with urine with otherwise no acute intra-abdominal or intrapelvic abnormality. 5. Other imaging findings of potential clinical significance: Sequela of prior granulomatous disease of the left lung. Chronic T9 and T11 compression fractures. Aortic Atherosclerosis (ICD10-I70.0). These results were  called by telephone at the time of interpretation on 02/07/2021 at 5:43 pm to provider [facility] , who verbally acknowledged these results. Electronically Signed   By: Iven Finn M.D.   On: 02/07/2021 18:01   CT ABDOMEN PELVIS W CONTRAST  Result Date: 02/07/2021 CLINICAL DATA:  wheezes throughout, whenever pt taken off any kind of O2, desaturates to 83%. Pt tachypnic. Abdominal distension EXAM: CT ANGIOGRAPHY CHEST CT ABDOMEN AND PELVIS WITH CONTRAST TECHNIQUE: Multidetector CT imaging of the chest was performed using the standard protocol during bolus administration of intravenous contrast. Multiplanar CT image reconstructions and MIPs were obtained to evaluate the vascular anatomy.  Multidetector CT imaging of the abdomen and pelvis was performed using the standard protocol during bolus administration of intravenous contrast. CONTRAST:  14mL OMNIPAQUE IOHEXOL 350 MG/ML SOLN COMPARISON:  CT chest 03/23/2019 FINDINGS: CTA CHEST FINDINGS Cardiovascular: Satisfactory opacification of the pulmonary arteries to the segmental level. Pulmonary artery filling defects within the left lower lobe segmental and subsegmental pulmonary arteries, right upper lobe segmental and subsegmental pulmonary arteries, right lower lobe subsegmental pulmonary arteries, right middle lobe segmental and subsegmental pulmonary arteries. Some of these appear central and cords like but are not visualized on the prior CT angiography chest 01/08/2021. Normal heart size. No pericardial effusion. Atherosclerotic plaque of the thoracic aorta. Mediastinum/Nodes: No enlarged mediastinal, hilar, or axillary lymph nodes. Thyroid gland and trachea demonstrate no significant findings. At least small to moderate hiatal hernia. Poor visualization of the esophagus due to timing of contrast. Lungs/Pleura: Hazy airspace opacity peripherally within the right upper lobe (6:45). Likely subsegmental atelectasis at the left lower lobe. Similar-appearing 2.2 x 2.3 cm partially calcified pulmonary nodule (interval decrease in size from PET-CT 06/21/2018). Calcified pulmonary nodule at the left apex (6:28). Left peribronchovascular calcifications likely representing old granulomatous disease (6:40, 57). Associated traction bronchiectasis and architectural distortion. No pulmonary mass. No pleural effusion. No pneumothorax. Musculoskeletal: No chest wall abnormality. No suspicious lytic or blastic osseous lesions. Old healed right rib fractures. No acute displaced fracture. Similar-appearing compression fractures of the T9 and T11 vertebral bodies. Multilevel degenerative changes of the spine. Degenerative changes of the visualized right hand. Review  of the MIP images confirms the above findings. CT ABDOMEN and PELVIS FINDINGS Hepatobiliary: No focal liver abnormality. Status post cholecystectomy. No biliary dilatation. Pancreas: No focal lesion. Normal pancreatic contour. No surrounding inflammatory changes. No main pancreatic ductal dilatation. Spleen: Normal in size without focal abnormality. Adrenals/Urinary Tract: No adrenal nodule bilaterally. Bilateral kidneys enhance symmetrically. No hydronephrosis. No hydroureter. The urinary bladder is distended with urine. Stomach/Bowel: Stomach is within normal limits. No evidence of bowel wall thickening or dilatation. Stool throughout the colon. The appendix not definitely identified. Vascular/Lymphatic: No abdominal aorta or iliac aneurysm. Moderate to severe calcified and noncalcified atherosclerotic plaque of the aorta and its branches. No abdominal, pelvic, or inguinal lymphadenopathy. Reproductive: Uterus and bilateral adnexa are unremarkable. Other: No intraperitoneal free fluid. No intraperitoneal free gas. No organized fluid collection. Musculoskeletal: Tiny fat containing umbilical hernia. No suspicious lytic or blastic osseous lesions. No acute displaced fracture. Multilevel degenerative changes of the spine. Review of the MIP images confirms the above findings. IMPRESSION: 1. All three right lobe and left lower lobe segmental and subsegmental pulmonary emboli. No findings of right heart strain. Possible developing pulmonary infarction in the right upper lobe. 2. At least small to moderate hiatal hernia. Poor visualization of the esophagus due to timing of contrast. 3. Similar-appearing 2.2 x 2.3 cm left apex lobe  partially calcified pulmonary nodule (interval decrease in size from PET-CT 06/21/2018). 4. Constipation and urinary bladder distended with urine with otherwise no acute intra-abdominal or intrapelvic abnormality. 5. Other imaging findings of potential clinical significance: Sequela of prior  granulomatous disease of the left lung. Chronic T9 and T11 compression fractures. Aortic Atherosclerosis (ICD10-I70.0). These results were called by telephone at the time of interpretation on 02/07/2021 at 5:43 pm to provider [facility] , who verbally acknowledged these results. Electronically Signed   By: Iven Finn M.D.   On: 02/07/2021 18:01   US Venous Img Lower Bilateral (DVT)  Result Date: 02/08/2021 CLINICAL DATA:  Bilateral lower extremity edema.  Evaluate for DVT. EXAM: BILATERAL LOWER EXTREMITY VENOUS DOPPLER ULTRASOUND TECHNIQUE: Gray-scale sonography with graded compression, as well as color Doppler and duplex ultrasound were performed to evaluate the lower extremity deep venous systems from the level of the common femoral vein and including the common femoral, femoral, profunda femoral, popliteal and calf veins including the posterior tibial, peroneal and gastrocnemius veins when visible. The superficial great saphenous vein was also interrogated. Spectral Doppler was utilized to evaluate flow at rest and with distal augmentation maneuvers in the common femoral, femoral and popliteal veins. COMPARISON:  Right lower extremity venous Doppler ultrasound-09/20/2018 (negative) Left lower extremity venous Doppler ultrasound-05/28/2015 (negative) FINDINGS: RIGHT LOWER EXTREMITY Common Femoral Vein: No evidence of thrombus. Normal compressibility, respiratory phasicity and response to augmentation. Saphenofemoral Junction: No evidence of thrombus. Normal compressibility and flow on color Doppler imaging. Profunda Femoral Vein: No evidence of thrombus. Normal compressibility and flow on color Doppler imaging. Femoral Vein: No evidence of thrombus. Normal compressibility, respiratory phasicity and response to augmentation. Popliteal Vein: No evidence of thrombus. Normal compressibility, respiratory phasicity and response to augmentation. Calf Veins: No evidence of thrombus. Normal compressibility and flow  on color Doppler imaging. Superficial Great Saphenous Vein: No evidence of thrombus. Normal compressibility. Other Findings:  None. LEFT LOWER EXTREMITY Common Femoral Vein: No evidence of thrombus. Normal compressibility, respiratory phasicity and response to augmentation. Saphenofemoral Junction: No evidence of thrombus. Normal compressibility and flow on color Doppler imaging. Profunda Femoral Vein: No evidence of thrombus. Normal compressibility and flow on color Doppler imaging. Femoral Vein: While the proximal and mid aspects of the left femoral vein appear widely patent, there is hypoechoic nonocclusive thrombus involving the distal aspect the left femoral vein (image 41) Popliteal Vein: There is hypoechoic near occlusive thrombus involving the left popliteal vein (images 44 and 45 Calf Veins: There is hypoechoic occlusive thrombus involving both paired left posterior tibial veins (image 50). The left peroneal vein appears patent where imaged. Superficial Great Saphenous Vein: No evidence of thrombus. Normal compressibility. Other Findings:  None. IMPRESSION: 1. The examination is positive for mixed occlusive and nonocclusive DVT extending from the distal aspect the left femoral vein through the level of the left posterior tibial veins. 2. No evidence of DVT within the right lower extremity. Electronically Signed   By: Sandi Mariscal M.D.   On: 02/08/2021 15:10   ECHOCARDIOGRAM COMPLETE  Result Date: 02/08/2021    ECHOCARDIOGRAM REPORT   Patient Name:   Jamie Matthews Date of Exam: 02/08/2021 Medical Rec #:  [id number]                     Height:       62.0 in Accession #:    [id number]  Weight:       137.0 lb Date of Birth:  Oct 04, 1940                      BSA:          1.628 m Patient Age:    7 years                      BP:           114/45 mmHg Patient Gender: F                             HR:           62 bpm. Exam Location:  [facility] Procedure: 2D Echo, Color Doppler and  Cardiac Doppler Indications:     Elevated troponin  History:         Patient has prior history of Echocardiogram examinations, most                  recent 10/10/2018. Signs/Symptoms:Murmur; Risk                  Factors:Dyslipidemia.  Sonographer:     Charmayne Sheer Referring Phys:  [id number] Rhetta Mura Diagnosing Phys: Ida Rogue MD IMPRESSIONS  1. Left ventricular ejection fraction, by estimation, is 60 to 65%. The left ventricle has normal function. The left ventricle has no regional wall motion abnormalities. Left ventricular diastolic parameters are consistent with Grade II diastolic dysfunction (pseudonormalization).  2. Right ventricular systolic function is normal. The right ventricular size is normal. There is moderately elevated pulmonary artery systolic pressure. The estimated right ventricular systolic pressure is 24.4 mmHg.  3. The mitral valve is normal in structure. No evidence of mitral valve regurgitation. No evidence of mitral stenosis.  4. Tricuspid valve regurgitation is moderate. FINDINGS  Left Ventricle: Left ventricular ejection fraction, by estimation, is 60 to 65%. The left ventricle has normal function. The left ventricle has no regional wall motion abnormalities. The left ventricular internal cavity size was normal in size. There is  no left ventricular hypertrophy. Left ventricular diastolic parameters are consistent with Grade II diastolic dysfunction (pseudonormalization). Right Ventricle: The right ventricular size is normal. No increase in right ventricular wall thickness. Right ventricular systolic function is normal. There is moderately elevated pulmonary artery systolic pressure. The tricuspid regurgitant velocity is 3.56 m/s, and with an assumed right atrial pressure of 5 mmHg, the estimated right ventricular systolic pressure is 01.0 mmHg. Left Atrium: Left atrial size was normal in size. Right Atrium: Right atrial size was normal in size. Pericardium: There is no evidence  of pericardial effusion. Mitral Valve: The mitral valve is normal in structure. No evidence of mitral valve regurgitation. No evidence of mitral valve stenosis. MV peak gradient, 6.1 mmHg. The mean mitral valve gradient is 2.0 mmHg. Tricuspid Valve: The tricuspid valve is normal in structure. Tricuspid valve regurgitation is moderate . No evidence of tricuspid stenosis. Aortic Valve: The aortic valve is normal in structure. Aortic valve regurgitation is not visualized. Mild to moderate aortic valve sclerosis/calcification is present, without any evidence of aortic stenosis. Aortic valve mean gradient measures 10.2 mmHg.  Aortic valve peak gradient measures 18.7 mmHg. Aortic valve area, by VTI measures 2.13 cm. Pulmonic Valve: The pulmonic valve was normal in structure. Pulmonic valve regurgitation is not visualized. No evidence of pulmonic stenosis. Aorta: The aortic root is normal in  size and structure. Venous: The inferior vena cava is normal in size with greater than 50% respiratory variability, suggesting right atrial pressure of 3 mmHg. IAS/Shunts: No atrial level shunt detected by color flow Doppler.  LEFT VENTRICLE PLAX 2D LVIDd:         4.00 cm  Diastology LVIDs:         2.50 cm  LV e' medial:    7.94 cm/s LV PW:         1.00 cm  LV E/e' medial:  12.7 LV IVS:        0.70 cm  LV e' lateral:   8.92 cm/s LVOT diam:     2.10 cm  LV E/e' lateral: 11.3 LV SV:         99 LV SV Index:   61 LVOT Area:     3.46 cm  LEFT ATRIUM           Index       RIGHT ATRIUM           Index LA diam:      3.40 cm 2.09 cm/m  RA Area:     13.30 cm LA Vol (A4C): 43.1 ml 26.48 ml/m RA Volume:   35.60 ml  21.87 ml/m  AORTIC VALVE                    PULMONIC VALVE AV Area (Vmax):    2.32 cm     PV Vmax:       1.25 m/s AV Area (Vmean):   2.11 cm     PV Vmean:      88.700 cm/s AV Area (VTI):     2.13 cm     PV VTI:        0.272 m AV Vmax:           216.25 cm/s  PV Peak grad:  6.2 mmHg AV Vmean:          152.750 cm/s PV Mean grad:   3.0 mmHg AV VTI:            0.465 m AV Peak Grad:      18.7 mmHg AV Mean Grad:      10.2 mmHg LVOT Vmax:         145.00 cm/s LVOT Vmean:        93.000 cm/s LVOT VTI:          0.286 m LVOT/AV VTI ratio: 0.61  AORTA Ao Root diam: 3.10 cm MITRAL VALVE                TRICUSPID VALVE MV Area (PHT): 2.73 cm     TR Peak grad:   50.7 mmHg MV Area VTI:   2.24 cm     TR Vmax:        356.00 cm/s MV Peak grad:  6.1 mmHg MV Mean grad:  2.0 mmHg     SHUNTS MV Vmax:       1.23 m/s     Systemic VTI:  0.29 m MV Vmean:      69.8 cm/s    Systemic Diam: 2.10 cm MV Decel Time: 278 msec MV E velocity: 101.00 cm/s MV A velocity: 96.00 cm/s MV E/A ratio:  1.05 Ida Rogue MD Electronically signed by Ida Rogue MD Signature Date/Time: 02/08/2021/2:20:10 PM    Final        The results of significant diagnostics from this hospitalization (including imaging, microbiology, ancillary and laboratory) are listed below  for reference.     Microbiology: Recent Results (from the past 240 hour(s))  Resp Panel by RT-PCR (Flu A&B, Covid) Nasopharyngeal Swab     Status: None   Collection Time: 02/07/21  3:57 PM   Specimen: Nasopharyngeal Swab; Nasopharyngeal(NP) swabs in vial transport medium  Result Value Ref Range Status   SARS Coronavirus 2 by RT PCR NEGATIVE NEGATIVE Final    Comment: (NOTE) SARS-CoV-2 target nucleic acids are NOT DETECTED.  The SARS-CoV-2 RNA is generally detectable in upper respiratory specimens during the acute phase of infection. The lowest concentration of SARS-CoV-2 viral copies this assay can detect is 138 copies/mL. A negative result does not preclude SARS-Cov-2 infection and should not be used as the sole basis for treatment or other patient management decisions. A negative result may occur with  improper specimen collection/handling, submission of specimen other than nasopharyngeal swab, presence of viral mutation(s) within the areas targeted by this assay, and inadequate number of  viral copies(<138 copies/mL). A negative result must be combined with clinical observations, patient history, and epidemiological information. The expected result is Negative.  Fact Sheet for Patients:  EntrepreneurPulse.com.au  Fact Sheet for Healthcare Providers:  IncredibleEmployment.be  This test is no t yet approved or cleared by the [place] FDA and  has been authorized for detection and/or diagnosis of SARS-CoV-2 by FDA under an Emergency Use Authorization (EUA). This EUA will remain  in effect (meaning this test can be used) for the duration of the COVID-19 declaration under Section 564(b)(1) of the Act, 21 U.S.C.section 360bbb-3(b)(1), unless the authorization is terminated  or revoked sooner.       Influenza A by PCR NEGATIVE NEGATIVE Final   Influenza B by PCR NEGATIVE NEGATIVE Final    Comment: (NOTE) The Xpert Xpress SARS-CoV-2/FLU/RSV plus assay is intended as an aid in the diagnosis of influenza from Nasopharyngeal swab specimens and should not be used as a sole basis for treatment. Nasal washings and aspirates are unacceptable for Xpert Xpress SARS-CoV-2/FLU/RSV testing.  Fact Sheet for Patients: EntrepreneurPulse.com.au  Fact Sheet for Healthcare Providers: IncredibleEmployment.be  This test is not yet approved or cleared by the [place] FDA and has been authorized for detection and/or diagnosis of SARS-CoV-2 by FDA under an Emergency Use Authorization (EUA). This EUA will remain in effect (meaning this test can be used) for the duration of the COVID-19 declaration under Section 564(b)(1) of the Act, 21 U.S.C. section 360bbb-3(b)(1), unless the authorization is terminated or revoked.  Performed at [facility], [address] [place], [place] [address]      Labs:  CBC: Recent Labs  Lab 02/07/21 1449 02/08/21 0332 02/09/21 0519 02/10/21 0526  02/11/21 0541  WBC 11.7* 8.1 14.3* 10.9* 11.8*  HGB 9.7* 9.1* 9.4* 9.2* 9.2*  HCT 28.6* 26.4* 27.9* 28.0* 27.5*  MCV 93.2 92.6 93.3 93.3 95.2  PLT 415* 380 463* 420* 459*   BMP &GFR Recent Labs  Lab 02/07/21 1449 02/08/21 0332 02/09/21 0519 02/10/21 0526 02/11/21 0541  NA 134* 134* 137 138 139  K 3.6 3.7 3.6 3.7 3.8  CL 96* 98 102 102 107  CO2 30 26 28 28 29   GLUCOSE 182* 215* 112* 117* 117*  BUN 22 20 22 20 17   CREATININE 1.30* 1.02* 0.97 0.95 0.88  CALCIUM 6.2* 6.4* 6.9* 7.0* 7.4*  MG 2.7* 2.8* 3.0* 2.6* 2.5*  PHOS  --  3.1 2.9 2.7 2.9   Estimated Creatinine Clearance: 45.8 mL/min (by C-G formula based on SCr of  0.88 mg/dL). Liver & Pancreas: Recent Labs  Lab 02/07/21 1449 02/08/21 0332 02/09/21 0519 02/10/21 0526 02/11/21 0541  AST 42* 36  --   --   --   ALT 37 34  --   --   --   ALKPHOS 95 90  --   --   --   BILITOT 0.6 0.8  --   --   --   PROT 5.2* 4.8*  --   --   --   ALBUMIN 2.0* 1.9* 2.0* 1.9* 2.5*   No results for input(s): LIPASE, AMYLASE in the last 168 hours. No results for input(s): AMMONIA in the last 168 hours. Diabetic: No results for input(s): HGBA1C in the last 72 hours. Recent Labs  Lab 02/08/21 0743 02/08/21 1729  GLUCAP 172* 138*   Cardiac Enzymes: Recent Labs  Lab 02/09/21 0519 02/11/21 0541  CKTOTAL 160 69   No results for input(s): PROBNP in the last 8760 hours. Coagulation Profile: Recent Labs  Lab 02/07/21 1449  INR 1.1   Thyroid Function Tests: No results for input(s): TSH, T4TOTAL, FREET4, T3FREE, THYROIDAB in the last 72 hours. Lipid Profile: No results for input(s): CHOL, HDL, LDLCALC, TRIG, CHOLHDL, LDLDIRECT in the last 72 hours. Anemia Panel: Recent Labs    02/09/21 0519  VITAMINB12 1,138*  FOLATE 8.3  FERRITIN 697*  TIBC 181*  IRON 110  RETICCTPCT 2.7   Urine analysis:    Component Value Date/Time   COLORURINE YELLOW (A) 01/08/2021 1300   APPEARANCEUR HAZY (A) 01/08/2021 1300   APPEARANCEUR Clear  09/23/2019 1103   LABSPEC 1.021 01/08/2021 1300   LABSPEC 1.027 06/10/2014 1759   PHURINE 7.0 01/08/2021 1300   GLUCOSEU NEGATIVE 01/08/2021 1300   GLUCOSEU Negative 06/10/2014 1759   HGBUR NEGATIVE 01/08/2021 1300   BILIRUBINUR NEGATIVE 01/08/2021 1300   BILIRUBINUR Negative 09/23/2019 1103   BILIRUBINUR Negative 06/10/2014 1759   KETONESUR NEGATIVE 01/08/2021 1300   PROTEINUR NEGATIVE 01/08/2021 1300   UROBILINOGEN 0.2 08/20/2017 1134   NITRITE NEGATIVE 01/08/2021 1300   LEUKOCYTESUR NEGATIVE 01/08/2021 1300   LEUKOCYTESUR Trace 06/10/2014 1759   Sepsis Labs: Invalid input(s): PROCALCITONIN, LACTICIDVEN   Time coordinating discharge: 45 minutes  SIGNED:  Mercy Riding, MD  [facility] 02/11/2021, 1:03 PM

## 2021-02-12 ENCOUNTER — Other Ambulatory Visit (HOSPITAL_COMMUNITY): Payer: Self-pay

## 2021-02-12 DIAGNOSIS — I951 Orthostatic hypotension: Secondary | ICD-10-CM | POA: Diagnosis not present

## 2021-02-12 DIAGNOSIS — R609 Edema, unspecified: Secondary | ICD-10-CM | POA: Diagnosis not present

## 2021-02-12 DIAGNOSIS — I82402 Acute embolism and thrombosis of unspecified deep veins of left lower extremity: Secondary | ICD-10-CM | POA: Diagnosis not present

## 2021-02-12 DIAGNOSIS — I2699 Other pulmonary embolism without acute cor pulmonale: Secondary | ICD-10-CM | POA: Diagnosis not present

## 2021-02-15 DIAGNOSIS — Z23 Encounter for immunization: Secondary | ICD-10-CM | POA: Diagnosis not present

## 2021-02-18 DIAGNOSIS — E785 Hyperlipidemia, unspecified: Secondary | ICD-10-CM | POA: Diagnosis not present

## 2021-02-18 DIAGNOSIS — R0781 Pleurodynia: Secondary | ICD-10-CM | POA: Diagnosis not present

## 2021-02-18 DIAGNOSIS — N3281 Overactive bladder: Secondary | ICD-10-CM | POA: Diagnosis not present

## 2021-02-18 DIAGNOSIS — E039 Hypothyroidism, unspecified: Secondary | ICD-10-CM | POA: Diagnosis not present

## 2021-02-19 DIAGNOSIS — K219 Gastro-esophageal reflux disease without esophagitis: Secondary | ICD-10-CM | POA: Diagnosis not present

## 2021-02-19 DIAGNOSIS — R278 Other lack of coordination: Secondary | ICD-10-CM | POA: Diagnosis not present

## 2021-02-19 DIAGNOSIS — J9601 Acute respiratory failure with hypoxia: Secondary | ICD-10-CM | POA: Diagnosis not present

## 2021-02-19 DIAGNOSIS — J90 Pleural effusion, not elsewhere classified: Secondary | ICD-10-CM | POA: Diagnosis not present

## 2021-02-19 DIAGNOSIS — Z741 Need for assistance with personal care: Secondary | ICD-10-CM | POA: Diagnosis not present

## 2021-02-19 DIAGNOSIS — R0781 Pleurodynia: Secondary | ICD-10-CM | POA: Diagnosis not present

## 2021-02-19 DIAGNOSIS — N3281 Overactive bladder: Secondary | ICD-10-CM | POA: Diagnosis not present

## 2021-02-19 DIAGNOSIS — R296 Repeated falls: Secondary | ICD-10-CM | POA: Diagnosis not present

## 2021-02-19 DIAGNOSIS — F329 Major depressive disorder, single episode, unspecified: Secondary | ICD-10-CM | POA: Diagnosis not present

## 2021-02-19 DIAGNOSIS — F0390 Unspecified dementia without behavioral disturbance: Secondary | ICD-10-CM | POA: Diagnosis not present

## 2021-02-19 DIAGNOSIS — R2689 Other abnormalities of gait and mobility: Secondary | ICD-10-CM | POA: Diagnosis not present

## 2021-02-19 DIAGNOSIS — M6281 Muscle weakness (generalized): Secondary | ICD-10-CM | POA: Diagnosis not present

## 2021-02-19 DIAGNOSIS — F419 Anxiety disorder, unspecified: Secondary | ICD-10-CM | POA: Diagnosis not present

## 2021-02-19 DIAGNOSIS — R4189 Other symptoms and signs involving cognitive functions and awareness: Secondary | ICD-10-CM | POA: Diagnosis not present

## 2021-02-20 ENCOUNTER — Encounter: Payer: Self-pay | Admitting: Oncology

## 2021-02-20 ENCOUNTER — Other Ambulatory Visit: Payer: Self-pay

## 2021-02-20 ENCOUNTER — Inpatient Hospital Stay: Payer: Medicare Other

## 2021-02-20 ENCOUNTER — Non-Acute Institutional Stay: Payer: Medicare Other | Admitting: Student

## 2021-02-20 ENCOUNTER — Inpatient Hospital Stay (HOSPITAL_BASED_OUTPATIENT_CLINIC_OR_DEPARTMENT_OTHER): Payer: Medicare Other | Admitting: Oncology

## 2021-02-20 VITALS — BP 120/74 | HR 59 | Temp 97.9°F | Resp 18 | Wt 143.3 lb

## 2021-02-20 DIAGNOSIS — C50912 Malignant neoplasm of unspecified site of left female breast: Secondary | ICD-10-CM

## 2021-02-20 DIAGNOSIS — C50812 Malignant neoplasm of overlapping sites of left female breast: Secondary | ICD-10-CM | POA: Insufficient documentation

## 2021-02-20 DIAGNOSIS — Z7952 Long term (current) use of systemic steroids: Secondary | ICD-10-CM | POA: Insufficient documentation

## 2021-02-20 DIAGNOSIS — C773 Secondary and unspecified malignant neoplasm of axilla and upper limb lymph nodes: Secondary | ICD-10-CM | POA: Insufficient documentation

## 2021-02-20 DIAGNOSIS — I2693 Single subsegmental pulmonary embolism without acute cor pulmonale: Secondary | ICD-10-CM | POA: Diagnosis not present

## 2021-02-20 DIAGNOSIS — I6782 Cerebral ischemia: Secondary | ICD-10-CM | POA: Diagnosis not present

## 2021-02-20 DIAGNOSIS — K2289 Other specified disease of esophagus: Secondary | ICD-10-CM | POA: Insufficient documentation

## 2021-02-20 DIAGNOSIS — Z5111 Encounter for antineoplastic chemotherapy: Secondary | ICD-10-CM

## 2021-02-20 DIAGNOSIS — K449 Diaphragmatic hernia without obstruction or gangrene: Secondary | ICD-10-CM | POA: Insufficient documentation

## 2021-02-20 DIAGNOSIS — J479 Bronchiectasis, uncomplicated: Secondary | ICD-10-CM | POA: Diagnosis not present

## 2021-02-20 DIAGNOSIS — Z8249 Family history of ischemic heart disease and other diseases of the circulatory system: Secondary | ICD-10-CM | POA: Insufficient documentation

## 2021-02-20 DIAGNOSIS — R269 Unspecified abnormalities of gait and mobility: Secondary | ICD-10-CM | POA: Insufficient documentation

## 2021-02-20 DIAGNOSIS — Z170 Estrogen receptor positive status [ER+]: Secondary | ICD-10-CM | POA: Insufficient documentation

## 2021-02-20 DIAGNOSIS — Z808 Family history of malignant neoplasm of other organs or systems: Secondary | ICD-10-CM | POA: Insufficient documentation

## 2021-02-20 DIAGNOSIS — Z515 Encounter for palliative care: Secondary | ICD-10-CM

## 2021-02-20 DIAGNOSIS — Z833 Family history of diabetes mellitus: Secondary | ICD-10-CM | POA: Insufficient documentation

## 2021-02-20 DIAGNOSIS — C3490 Malignant neoplasm of unspecified part of unspecified bronchus or lung: Secondary | ICD-10-CM

## 2021-02-20 DIAGNOSIS — Z79811 Long term (current) use of aromatase inhibitors: Secondary | ICD-10-CM | POA: Diagnosis not present

## 2021-02-20 DIAGNOSIS — R0602 Shortness of breath: Secondary | ICD-10-CM | POA: Diagnosis not present

## 2021-02-20 DIAGNOSIS — J9601 Acute respiratory failure with hypoxia: Secondary | ICD-10-CM | POA: Diagnosis not present

## 2021-02-20 DIAGNOSIS — M7989 Other specified soft tissue disorders: Secondary | ICD-10-CM | POA: Diagnosis not present

## 2021-02-20 DIAGNOSIS — J90 Pleural effusion, not elsewhere classified: Secondary | ICD-10-CM | POA: Insufficient documentation

## 2021-02-20 DIAGNOSIS — C3492 Malignant neoplasm of unspecified part of left bronchus or lung: Secondary | ICD-10-CM

## 2021-02-20 DIAGNOSIS — R5383 Other fatigue: Secondary | ICD-10-CM | POA: Diagnosis not present

## 2021-02-20 DIAGNOSIS — K429 Umbilical hernia without obstruction or gangrene: Secondary | ICD-10-CM | POA: Insufficient documentation

## 2021-02-20 DIAGNOSIS — R059 Cough, unspecified: Secondary | ICD-10-CM | POA: Diagnosis not present

## 2021-02-20 DIAGNOSIS — R531 Weakness: Secondary | ICD-10-CM

## 2021-02-20 DIAGNOSIS — K5900 Constipation, unspecified: Secondary | ICD-10-CM

## 2021-02-20 DIAGNOSIS — N179 Acute kidney failure, unspecified: Secondary | ICD-10-CM

## 2021-02-20 DIAGNOSIS — I82442 Acute embolism and thrombosis of left tibial vein: Secondary | ICD-10-CM | POA: Insufficient documentation

## 2021-02-20 DIAGNOSIS — R600 Localized edema: Secondary | ICD-10-CM | POA: Insufficient documentation

## 2021-02-20 DIAGNOSIS — C3412 Malignant neoplasm of upper lobe, left bronchus or lung: Secondary | ICD-10-CM | POA: Diagnosis not present

## 2021-02-20 DIAGNOSIS — Z79899 Other long term (current) drug therapy: Secondary | ICD-10-CM | POA: Insufficient documentation

## 2021-02-20 DIAGNOSIS — R339 Retention of urine, unspecified: Secondary | ICD-10-CM | POA: Insufficient documentation

## 2021-02-20 DIAGNOSIS — Z9049 Acquired absence of other specified parts of digestive tract: Secondary | ICD-10-CM | POA: Insufficient documentation

## 2021-02-20 DIAGNOSIS — Z882 Allergy status to sulfonamides status: Secondary | ICD-10-CM | POA: Insufficient documentation

## 2021-02-20 DIAGNOSIS — M47814 Spondylosis without myelopathy or radiculopathy, thoracic region: Secondary | ICD-10-CM | POA: Diagnosis not present

## 2021-02-20 DIAGNOSIS — R609 Edema, unspecified: Secondary | ICD-10-CM | POA: Insufficient documentation

## 2021-02-20 DIAGNOSIS — Z7901 Long term (current) use of anticoagulants: Secondary | ICD-10-CM | POA: Insufficient documentation

## 2021-02-20 DIAGNOSIS — Z803 Family history of malignant neoplasm of breast: Secondary | ICD-10-CM | POA: Insufficient documentation

## 2021-02-20 DIAGNOSIS — Z8719 Personal history of other diseases of the digestive system: Secondary | ICD-10-CM | POA: Insufficient documentation

## 2021-02-20 DIAGNOSIS — E871 Hypo-osmolality and hyponatremia: Secondary | ICD-10-CM | POA: Insufficient documentation

## 2021-02-20 LAB — CBC WITH DIFFERENTIAL/PLATELET
Abs Immature Granulocytes: 1.23 10*3/uL — ABNORMAL HIGH (ref 0.00–0.07)
Basophils Absolute: 0.2 10*3/uL — ABNORMAL HIGH (ref 0.0–0.1)
Basophils Relative: 1 %
Eosinophils Absolute: 0.5 10*3/uL (ref 0.0–0.5)
Eosinophils Relative: 3 %
HCT: 29.6 % — ABNORMAL LOW (ref 36.0–46.0)
Hemoglobin: 9.5 g/dL — ABNORMAL LOW (ref 12.0–15.0)
Immature Granulocytes: 7 %
Lymphocytes Relative: 10 %
Lymphs Abs: 1.6 10*3/uL (ref 0.7–4.0)
MCH: 30.2 pg (ref 26.0–34.0)
MCHC: 32.1 g/dL (ref 30.0–36.0)
MCV: 94 fL (ref 80.0–100.0)
Monocytes Absolute: 2.2 10*3/uL — ABNORMAL HIGH (ref 0.1–1.0)
Monocytes Relative: 13 %
Neutro Abs: 11 10*3/uL — ABNORMAL HIGH (ref 1.7–7.7)
Neutrophils Relative %: 66 %
Platelets: 590 10*3/uL — ABNORMAL HIGH (ref 150–400)
RBC: 3.15 MIL/uL — ABNORMAL LOW (ref 3.87–5.11)
RDW: 15.5 % (ref 11.5–15.5)
Smear Review: INCREASED
WBC: 16.7 10*3/uL — ABNORMAL HIGH (ref 4.0–10.5)
nRBC: 0 % (ref 0.0–0.2)

## 2021-02-20 LAB — COMPREHENSIVE METABOLIC PANEL
ALT: 39 U/L (ref 0–44)
AST: 39 U/L (ref 15–41)
Albumin: 2.7 g/dL — ABNORMAL LOW (ref 3.5–5.0)
Alkaline Phosphatase: 149 U/L — ABNORMAL HIGH (ref 38–126)
Anion gap: 9 (ref 5–15)
BUN: 25 mg/dL — ABNORMAL HIGH (ref 8–23)
CO2: 28 mmol/L (ref 22–32)
Calcium: 7.1 mg/dL — ABNORMAL LOW (ref 8.9–10.3)
Chloride: 96 mmol/L — ABNORMAL LOW (ref 98–111)
Creatinine, Ser: 1.37 mg/dL — ABNORMAL HIGH (ref 0.44–1.00)
GFR, Estimated: 39 mL/min — ABNORMAL LOW (ref >60)
Glucose, Bld: 152 mg/dL — ABNORMAL HIGH (ref 70–99)
Potassium: 4.3 mmol/L (ref 3.5–5.1)
Sodium: 133 mmol/L — ABNORMAL LOW (ref 135–145)
Total Bilirubin: 0.5 mg/dL (ref 0.3–1.2)
Total Protein: 6.6 g/dL (ref 6.5–8.1)

## 2021-02-20 NOTE — Progress Notes (Signed)
Patient here for follow up. No new concerns voiced.  °

## 2021-02-20 NOTE — Progress Notes (Signed)
Hematology/Oncology follow up note [facility] [phone] Fax:([phone]   Patient Care Team: Venia Carbon, MD as PCP - General (Internal Medicine) Pyrtle, Lajuan Lines, MD as Consulting Physician (Gastroenterology) Telford Nab, RN as Registered Nurse Earlie Server, MD as Consulting Physician (Hematology and Oncology) Debbora Dus, [facility] as Pharmacist (Pharmacist) Burnice Logan, [facility] (Inactive) as Pharmacist (Pharmacist) Ralene Bathe, MD (Dermatology) Festus Aloe, MD as Consulting Physician (Urology)   REASON FOR VISIT:  Follow up for management of lung cancer and breast cancer  HISTORY OF PRESENTING ILLNESS:  Jamie Matthews is a  80 y.o.  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 05/05/2018 bilateral diagnostic breast mammogram showed suspicious mass 1.1cm  in the 12:00 retroareolar region of the left breast and the left axillary lymph node. 06/08/2018 patient status post a left breast retroareolar and left axillary lymph node biopsy. Pathology showed invasive mammary carcinoma, no special type, grade 1, left axillary lymph node positive for invasive mammary carcinoma clinically metastatic.  Background lymph node architecture is not identified. ER> 90% PR> 90%, HER-2 negative.  PET scan done which unfortunately showed additional hypermetabolic bilateral hilar lymph nodes as well as left upper lobe lung mass which may represent a focus of metastatic disease from breast, or primary lung neoplasm.  There is multiple additional small pulmonary nodules are scattered throughout both lungs which are worrisome for metastasis.  Index nodule within the medial right upper lobe measures 7 mm,, peri-broncho-vascular nodule in the right lower lobe measures 1.2 cm.  # Stage IV lung cancer-  Biopsy of lung mass left upper lobe showed non-small cell lung cancer, favor adenocarcinoma.  #NGS came back  patient has PD L1 is 70% TPS, MET fusion mutation.  #Mid-March 2020 started on crizotinib  Bilateral lower extremity edema, right >left, Ultrasound venous right 09/20/2018 no DVT.  2D echo 10/10/2018 showed LVEF 55 to 60% Most likely secondary to crizotinib side effects.  # 12/15/2018 CT chest images were independently reviewed and discussed with patient. Dominant left upper lobe nodule and multiple scattered irregular pulmonary parenchymal nodular lesions are stable. Mild basilar pulmonary parenchymal septal thickening is new and can be seen with pulmonary edema. Patient reports no shortness of breath asymptomatic  #12/29/2018 unilateral left diagnostic mammogram with ultrasound images were independently reviewed and discussed with patient. Slight interval reduction of the size of the left retroareolar breast mass, 9 x 6 x 7 mm, comparing to 10 x 9 x 8 mm. Axillary lymph node measured 1.3 x 1.0 x 1.0 cm, previously 2.2 x 1.4 x 1.5 cm,  there were 2 other abdominal lymph nodes in the left axilla, which were not reported in previous ultrasound. Discussed with patient that overall, breast cancer responded to endocrine treatments.  Additional 2 lymph nodes need to be closely monitored.  Recommend patient to continue Arimidex.  01/08/2021 - 01/11/2021, patient was admitted to the hospital due to hypoxia, acute respiratory failure.  COVID/influenza were negative.  01/08/2021, CT angio chest PE protocol showed no PE new small bilateral pleural effusion.  New moderate hiatal hernia and a diffuse distended esophagus with air-fluid level.  Suggestive of gastroesophageal reflux.  Unchanged 2.3 cm faintly calcified solid pulmonary nodule in the apical left upper lobe. It was felt that the acute respiratory failure with hypoxia is probably due to upper respiratory infection. Patient was treated with IV steroid, and discharged on a tapering course of steroids. Procalcitonin is less than  0.1. Crizotinib was held  during the admission.  INTERVAL HISTORY Jamie Matthews is a 80 y.o. female who has above history reviewed by me today presents fornon-small cell lung cancer and breast cancer management.  Admitted from 02/07/2021- 02/11/2021 due to acute PE, and left lower extremity DVT, started on heparin switch to Eliquis $RemoveBe'5mg'NchMdshDs$  BID. She was discharge. Also acute urinary retention and AKI.   She now lives at [facility]. Accompanied by facility staff No bleeding events.   Review of Systems  Constitutional:  Negative for appetite change, chills, fatigue and fever.  HENT:   Negative for hearing loss and voice change.   Eyes:  Negative for eye problems.  Respiratory:  Negative for chest tightness, cough and wheezing.   Cardiovascular:  Positive for leg swelling. Negative for chest pain.  Gastrointestinal:  Negative for abdominal distention, abdominal pain, blood in stool, constipation and nausea.  Endocrine: Negative for hot flashes.  Genitourinary:  Negative for difficulty urinating and frequency.   Musculoskeletal:  Negative for arthralgias.  Skin:  Negative for itching and rash.  Neurological:  Negative for extremity weakness and light-headedness.  Hematological:  Negative for adenopathy.  Psychiatric/Behavioral:  Negative for confusion and sleep disturbance.    MEDICAL HISTORY:  Past Medical History:  Diagnosis Date   AKI (acute kidney injury) ([place]) 08/17/2018   Anxiety    Arthritis    Belching    Bladder disorder    OVERACTIVE   Bowel dysfunction    BLOCKAGE   Cancer (HCC)    breast   Constipation    Depression    Diverticulitis    Fibromyalgia    GERD (gastroesophageal reflux disease)    Hyperlipidemia    IBS (irritable bowel syndrome)    Internal hemorrhoids    Lung cancer ([place]) 2020   Memory deficits    Murmur    asymptomatic   Pneumonia 11/18/12   Urinary incontinence    Vertigo     SURGICAL HISTORY: Past Surgical History:  Procedure  Laterality Date   AXILLARY LYMPH NODE BIOPSY Left 06/08/2018   INVASIVE MAMMARY CARCINOMA   BLADDER SUSPENSION  2004, 2012   BREAST BIOPSY Left 06/08/2018   INVASIVE MAMMARY CARCINOMA   CATARACT EXTRACTION W/PHACO Right 08/27/2015   Procedure: CATARACT EXTRACTION PHACO AND INTRAOCULAR LENS PLACEMENT ([place]);  Surgeon: Estill Cotta, MD;  Location: [facility] ORS;  Service: Ophthalmology;  Laterality: Right;  [place]   1:00.2 AP%  22.5 CDE  23.67 fluid casette lot #[id number]  exp05/31/2018   CATARACT EXTRACTION W/PHACO Left 10/15/2015   Procedure: CATARACT EXTRACTION PHACO AND INTRAOCULAR LENS PLACEMENT ([facility]);  Surgeon: Estill Cotta, MD;  Location: [facility] ORS;  Service: Ophthalmology;  Laterality: Left;  [place] 01:07 AP% 18.1 CDE 21.57 fluid pack lot # [id number]   CHOLECYSTECTOMY     COLONOSCOPY  2017   ELECTROMAGNETIC NAVIGATION BROCHOSCOPY N/A 07/09/2018   Procedure: ELECTROMAGNETIC NAVIGATION BRONCHOSCOPY;  Surgeon: Flora Lipps, MD;  Location: [facility] ORS;  Service: Cardiopulmonary;  Laterality: N/A;   TONSILLECTOMY  1947    SOCIAL HISTORY: Social History   Socioeconomic History   Marital status: Married    Spouse name: Scientist, forensic   Number of children: 0   Years of education: Master's Degree   Highest education level: Not on file  Occupational History   Occupation: Retired  Tobacco Use   Smoking status: Never   Smokeless tobacco: Never  Vaping Use   Vaping Use: Never used  Substance and Sexual Activity   Alcohol  use: Not Currently    Comment: rare wine   Drug use: No   Sexual activity: Not Currently  Other Topics Concern   Not on file  Social History Narrative   Recently moved with her husband to [place] from [place].   Husband is a retired Pharmacist, community.   No children.   She is a retired Pharmacist, hospital.   Enjoys: Chief Technology Officer, reading - mysteries and biographies, cooking   Exercise: walking, gardening   Diet: low appetite due to cancer treatment, grazing   She is a DNR.   Social  Determinants of Health   Financial Resource Strain: Not on file  Food Insecurity: Not on file  Transportation Needs: Not on file  Physical Activity: Not on file  Stress: Not on file  Social Connections: Not on file  Intimate Partner Violence: Not on file    FAMILY HISTORY: Family History  Problem Relation Age of Onset   Diabetes Father    Heart disease Father    Lymphoma Father    Heart disease Mother    Breast cancer Sister 61   Colon cancer Neg Hx    Esophageal cancer Neg Hx    Rectal cancer Neg Hx    Stomach cancer Neg Hx    Bladder Cancer Neg Hx    Kidney cancer Neg Hx     ALLERGIES:  is allergic to sulfa antibiotics.  MEDICATIONS:  Current Outpatient Medications  Medication Sig Dispense Refill   acetaminophen (TYLENOL) 325 MG tablet Take 325 mg by mouth every 6 (six) hours as needed for mild pain.      albuterol (VENTOLIN HFA) 108 (90 Base) MCG/ACT inhaler Inhale 2 puffs into the lungs every 6 (six) hours as needed for wheezing or shortness of breath. 8 g 2   alum & mag hydroxide-simeth (MAALOX/MYLANTA) 200-200-20 MG/5ML suspension Take 30 mLs by mouth every 6 (six) hours as needed for indigestion or heartburn. 355 mL 0   anastrozole (ARIMIDEX) 1 MG tablet Take 1 tablet (1 mg total) by mouth daily. 90 tablet 1   apixaban (ELIQUIS) 5 MG TABS tablet Take 2 tablets (10 mg total) by mouth 2 (two) times daily for 4 days, THEN 1 tablet (5 mg total) 2 (two) times daily. 188 tablet 0   benzonatate (TESSALON) 100 MG capsule TAKE 1 CAPSULE BY MOUTH 3 TIMES DAILY ASNEEDED FOR COUGH. 30 capsule 0   Calcium-Magnesium-Vitamin D (CALCIUM 1200+D3 PO) Take 1 tablet by mouth daily.     crizotinib (XALKORI) 250 MG capsule Take 1 capsule (250 mg total) by mouth 2 (two) times daily. 60 capsule 1   Dextromethorphan-guaiFENesin (MUCINEX DM) 30-600 MG TB12 Take 1 tablet by mouth 2 (two) times daily as needed (for congestion/cough).     fluticasone (FLONASE) 50 MCG/ACT nasal spray Place 2 sprays  into both nostrils daily. 16 g 6   furosemide (LASIX) 40 MG tablet Take 2 tablets (80 mg total) by mouth daily. 30 tablet    levothyroxine (SYNTHROID) 25 MCG tablet TAKE 1 TABLET BY MOUTH DAILY 90 tablet 3   lubiprostone (AMITIZA) 24 MCG capsule TAKE ONE CAPSULE TWICE A DAY WITH MEALS 60 capsule 3   memantine (NAMENDA) 10 MG tablet Take 1 tablet (10 mg total) by mouth 2 (two) times daily. 180 tablet 2   Multiple Vitamin (MULTIVITAMIN WITH MINERALS) TABS tablet Take 1 tablet by mouth daily.     ondansetron (ZOFRAN) 8 MG tablet TAKE ONE TABLET EVERY EIGHT HOURS AS NEEDED FOR NAUSEA / VOMITING 60  tablet 1   polyethylene glycol (MIRALAX / GLYCOLAX) 17 g packet Take 17 g by mouth 2 (two) times daily. 14 each 0   potassium chloride SA (KLOR-CON) 20 MEQ tablet Take 20 mEq by mouth 2 (two) times daily.     senna-docusate (SENOKOT-S) 8.6-50 MG tablet Take 1 tablet by mouth 2 (two) times daily between meals as needed for mild constipation. 60 tablet 0   venlafaxine XR (EFFEXOR-XR) 150 MG 24 hr capsule TAKE 1 CAPSULE BY MOUTH EVERY DAY 90 capsule 1   Vitamin D, Ergocalciferol, (DRISDOL) 1.25 MG (50000 UNIT) CAPS capsule Take 50,000 Units by mouth every Sunday at 6pm.     No current facility-administered medications for this visit.     PHYSICAL EXAMINATION: ECOG PERFORMANCE STATUS: 2 - Symptomatic, <50% confined to bed Vitals:   02/20/21 1501  BP: 120/74  Pulse: (!) 59  Resp: 18  Temp: 97.9 F (36.6 C)  SpO2: 95%   Filed Weights   02/20/21 1501  Weight: 143 lb 4.8 oz (65 kg)    Physical Exam Constitutional:      General: She is not in acute distress.    Appearance: She is not diaphoretic.     Comments: Frail female, sits in the wheelchair.  HENT:     Head: Normocephalic and atraumatic.     Nose: Nose normal.     Mouth/Throat:     Pharynx: No oropharyngeal exudate.  Eyes:     General: No scleral icterus.    Pupils: Pupils are equal, round, and reactive to light.  Cardiovascular:      Rate and Rhythm: Normal rate and regular rhythm.     Heart sounds: No murmur heard. Pulmonary:     Effort: Pulmonary effort is normal. No respiratory distress.     Breath sounds: No wheezing or rales.  Chest:     Chest wall: No tenderness.  Abdominal:     General: There is no distension.     Palpations: Abdomen is soft.     Tenderness: There is no abdominal tenderness.  Musculoskeletal:        General: Normal range of motion.     Cervical back: Normal range of motion and neck supple.     Comments: Bilateral lower extremity 1+ edema  Skin:    General: Skin is warm and dry.     Findings: No erythema.  Neurological:     Mental Status: She is alert and oriented to person, place, and time.     Cranial Nerves: No cranial nerve deficit.     Motor: No abnormal muscle tone.     Coordination: Coordination normal.  Psychiatric:        Mood and Affect: Affect normal.     CMP Latest Ref Rng & Units 02/20/2021  Glucose 70 - 99 mg/dL 152(H)  BUN 8 - 23 mg/dL 25(H)  Creatinine 0.44 - 1.00 mg/dL 1.37(H)  Sodium 135 - 145 mmol/L 133(L)  Potassium 3.5 - 5.1 mmol/L 4.3  Chloride 98 - 111 mmol/L 96(L)  CO2 22 - 32 mmol/L 28  Calcium 8.9 - 10.3 mg/dL 7.1(L)  Total Protein 6.5 - 8.1 g/dL 6.6  Total Bilirubin 0.3 - 1.2 mg/dL 0.5  Alkaline Phos 38 - 126 U/L 149(H)  AST 15 - 41 U/L 39  ALT 0 - 44 U/L 39   CBC Latest Ref Rng & Units 02/20/2021  WBC 4.0 - 10.5 K/uL 16.7(H)  Hemoglobin 12.0 - 15.0 g/dL 9.5(L)  Hematocrit 36.0 - 46.0 %  29.6(L)  Platelets 150 - 400 K/uL 590(H)    LABORATORY DATA:  I have reviewed the data as listed Lab Results  Component Value Date   WBC 16.7 (H) 02/20/2021   HGB 9.5 (L) 02/20/2021   HCT 29.6 (L) 02/20/2021   MCV 94.0 02/20/2021   PLT 590 (H) 02/20/2021   Recent Labs    02/07/21 1449 02/08/21 0332 02/09/21 0519 02/10/21 0526 02/11/21 0541 02/20/21 1427  NA 134* 134*   < > 138 139 133*  K 3.6 3.7   < > 3.7 3.8 4.3  CL 96* 98   < > 102 107 96*   CO2 30 26   < > $R'28 29 28  'mk$ GLUCOSE 182* 215*   < > 117* 117* 152*  BUN 22 20   < > 20 17 25*  CREATININE 1.30* 1.02*   < > 0.95 0.88 1.37*  CALCIUM 6.2* 6.4*   < > 7.0* 7.4* 7.1*  GFRNONAA 41* 55*   < > 60* >60 39*  PROT 5.2* 4.8*  --   --   --  6.6  ALBUMIN 2.0* 1.9*   < > 1.9* 2.5* 2.7*  AST 42* 36  --   --   --  39  ALT 37 34  --   --   --  39  ALKPHOS 95 90  --   --   --  149*  BILITOT 0.6 0.8  --   --   --  0.5  BILIDIR <0.1  --   --   --   --   --   IBILI NOT CALCULATED  --   --   --   --   --    < > = values in this interval not displayed.    Iron/TIBC/Ferritin/ %Sat    Component Value Date/Time   IRON 110 02/09/2021 0519   TIBC 181 (L) 02/09/2021 0519   FERRITIN 697 (H) 02/09/2021 0519   IRONPCTSAT 61 (H) 02/09/2021 0519     RADIOGRAPHIC STUDIES: I have personally reviewed the radiological images as listed and agreed with the findings in the report. DG Chest 2 View  Result Date: 02/07/2021 CLINICAL DATA:  Shortness of breath EXAM: CHEST - 2 VIEW COMPARISON:  Radiograph 01/08/2021, chest CT 01/08/2021 FINDINGS: Unchanged cardiomediastinal silhouette. There is no new focal airspace disease. Known faintly calcified nodule in the left apex is poorly visualized radiographically. Unchanged calcified more peripheral left apical calcified granuloma. There is no large pleural effusion or visible pneumothorax. Elevated right hemidiaphragm with colonic interposition. No acute osseous abnormality. Thoracic spondylosis. IMPRESSION: No new focal airspace disease. Electronically Signed   By: Maurine Simmering M.D.   On: 02/07/2021 16:01   DG Chest 2 View  Result Date: 12/28/2020 CLINICAL DATA:  Cough, wheezing and shortness of breath 1 week. EXAM: CHEST - 2 VIEW COMPARISON:  Chest CT 09/21/2020 FINDINGS: The cardiac silhouette, mediastinal and hilar contours are within normal limits and stable. Stable left upper lobe pulmonary lesion. Stable left upper lobe calcified granulomas. Stable mild  eventration of the right hemidiaphragm. Mild streaky bibasilar atelectasis but no definite infiltrates or edema. Suspect small bilateral pleural effusions on the lateral film. Remote healed rib fractures are noted. IMPRESSION: 1. Stable left upper lobe pulmonary lesion. 2. Suspect small bilateral pleural effusions and bibasilar atelectasis. Electronically Signed   By: Marijo Sanes M.D.   On: 12/28/2020 15:35   CT HEAD WO CONTRAST (5MM)  Result Date: 02/07/2021 CLINICAL DATA:  Mental status  change EXAM: CT HEAD WITHOUT CONTRAST TECHNIQUE: Contiguous axial images were obtained from the base of the skull through the vertex without intravenous contrast. COMPARISON:  MRI 10/24/2020, CT brain 10/31/2018 FINDINGS: Brain: No acute territorial infarction, hemorrhage or intracranial mass. Atrophy and chronic small vessel ischemic changes of the white matter. Stable ventricle size Vascular: No hyperdense vessels.  Carotid vascular calcification Skull: Normal. Negative for fracture or focal lesion. Sinuses/Orbits: No acute finding. Other: None IMPRESSION: 1. No CT evidence for acute intracranial abnormality. 2. Atrophy and chronic small vessel ischemic changes of the white matter Electronically Signed   By: Donavan Foil M.D.   On: 02/07/2021 17:29   CT Angio Chest PE W and/or Wo Contrast  Result Date: 02/07/2021 CLINICAL DATA:  wheezes throughout, whenever pt taken off any kind of O2, desaturates to 83%. Pt tachypnic. Abdominal distension EXAM: CT ANGIOGRAPHY CHEST CT ABDOMEN AND PELVIS WITH CONTRAST TECHNIQUE: Multidetector CT imaging of the chest was performed using the standard protocol during bolus administration of intravenous contrast. Multiplanar CT image reconstructions and MIPs were obtained to evaluate the vascular anatomy. Multidetector CT imaging of the abdomen and pelvis was performed using the standard protocol during bolus administration of intravenous contrast. CONTRAST:  2mL OMNIPAQUE IOHEXOL 350  MG/ML SOLN COMPARISON:  CT chest 03/23/2019 FINDINGS: CTA CHEST FINDINGS Cardiovascular: Satisfactory opacification of the pulmonary arteries to the segmental level. Pulmonary artery filling defects within the left lower lobe segmental and subsegmental pulmonary arteries, right upper lobe segmental and subsegmental pulmonary arteries, right lower lobe subsegmental pulmonary arteries, right middle lobe segmental and subsegmental pulmonary arteries. Some of these appear central and cords like but are not visualized on the prior CT angiography chest 01/08/2021. Normal heart size. No pericardial effusion. Atherosclerotic plaque of the thoracic aorta. Mediastinum/Nodes: No enlarged mediastinal, hilar, or axillary lymph nodes. Thyroid gland and trachea demonstrate no significant findings. At least small to moderate hiatal hernia. Poor visualization of the esophagus due to timing of contrast. Lungs/Pleura: Hazy airspace opacity peripherally within the right upper lobe (6:45). Likely subsegmental atelectasis at the left lower lobe. Similar-appearing 2.2 x 2.3 cm partially calcified pulmonary nodule (interval decrease in size from PET-CT 06/21/2018). Calcified pulmonary nodule at the left apex (6:28). Left peribronchovascular calcifications likely representing old granulomatous disease (6:40, 57). Associated traction bronchiectasis and architectural distortion. No pulmonary mass. No pleural effusion. No pneumothorax. Musculoskeletal: No chest wall abnormality. No suspicious lytic or blastic osseous lesions. Old healed right rib fractures. No acute displaced fracture. Similar-appearing compression fractures of the T9 and T11 vertebral bodies. Multilevel degenerative changes of the spine. Degenerative changes of the visualized right hand. Review of the MIP images confirms the above findings. CT ABDOMEN and PELVIS FINDINGS Hepatobiliary: No focal liver abnormality. Status post cholecystectomy. No biliary dilatation. Pancreas:  No focal lesion. Normal pancreatic contour. No surrounding inflammatory changes. No main pancreatic ductal dilatation. Spleen: Normal in size without focal abnormality. Adrenals/Urinary Tract: No adrenal nodule bilaterally. Bilateral kidneys enhance symmetrically. No hydronephrosis. No hydroureter. The urinary bladder is distended with urine. Stomach/Bowel: Stomach is within normal limits. No evidence of bowel wall thickening or dilatation. Stool throughout the colon. The appendix not definitely identified. Vascular/Lymphatic: No abdominal aorta or iliac aneurysm. Moderate to severe calcified and noncalcified atherosclerotic plaque of the aorta and its branches. No abdominal, pelvic, or inguinal lymphadenopathy. Reproductive: Uterus and bilateral adnexa are unremarkable. Other: No intraperitoneal free fluid. No intraperitoneal free gas. No organized fluid collection. Musculoskeletal: Tiny fat containing umbilical hernia. No suspicious lytic or blastic  osseous lesions. No acute displaced fracture. Multilevel degenerative changes of the spine. Review of the MIP images confirms the above findings. IMPRESSION: 1. All three right lobe and left lower lobe segmental and subsegmental pulmonary emboli. No findings of right heart strain. Possible developing pulmonary infarction in the right upper lobe. 2. At least small to moderate hiatal hernia. Poor visualization of the esophagus due to timing of contrast. 3. Similar-appearing 2.2 x 2.3 cm left apex lobe partially calcified pulmonary nodule (interval decrease in size from PET-CT 06/21/2018). 4. Constipation and urinary bladder distended with urine with otherwise no acute intra-abdominal or intrapelvic abnormality. 5. Other imaging findings of potential clinical significance: Sequela of prior granulomatous disease of the left lung. Chronic T9 and T11 compression fractures. Aortic Atherosclerosis (ICD10-I70.0). These results were called by telephone at the time of  interpretation on 02/07/2021 at 5:43 pm to provider [facility] , who verbally acknowledged these results. Electronically Signed   By: Iven Finn M.D.   On: 02/07/2021 18:01   CT Angio Chest PE W and/or Wo Contrast  Result Date: 01/08/2021 CLINICAL DATA:  Cough, congestion, shortness of breath. History of lung cancer and breast cancer. EXAM: CT ANGIOGRAPHY CHEST WITH CONTRAST TECHNIQUE: Multidetector CT imaging of the chest was performed using the standard protocol during bolus administration of intravenous contrast. Multiplanar CT image reconstructions and MIPs were obtained to evaluate the vascular anatomy. CONTRAST:  39mL OMNIPAQUE IOHEXOL 350 MG/ML SOLN COMPARISON:  CT chest dated September 21, 2020. FINDINGS: Cardiovascular: Satisfactory opacification of the pulmonary arteries to the segmental level. No evidence of pulmonary embolism. Normal heart size. No pericardial effusion. No thoracic aortic aneurysm or dissection. Mild atherosclerotic calcification of the aortic arch. Mediastinum/Nodes: No enlarged mediastinal, hilar, or axillary lymph nodes. Thyroid gland, and trachea demonstrate no significant findings. New moderate hiatal hernia and diffusely distended esophagus with air-fluid level. Lungs/Pleura: New small left greater than right pleural effusions. No consolidation or pneumothorax. Mild subsegmental atelectasis in the left lower lobe. Unchanged faintly calcified 2.3 x 2.0 cm nodule in the apical left upper lobe (series 7, image 20). Unchanged 5 mm nodule in the right middle lobe (series 7, image 39). Upper Abdomen: No acute abnormality. Large amount of stool in the visualized colon. Musculoskeletal: No chest wall abnormality. No acute or significant osseous findings. Chronic T9 and T11 compression deformities again noted. Multiple old right-sided rib fractures again noted. Review of the MIP images confirms the above findings. IMPRESSION: 1. No evidence of pulmonary embolism. 2. New small bilateral  pleural effusions. 3. New moderate hiatal hernia and diffusely distended esophagus with air-fluid level, suggestive of gastroesophageal reflux. 4. Unchanged 2.3 cm faintly calcified solid pulmonary nodule in the apical left upper lobe. 5. Aortic Atherosclerosis (ICD10-I70.0). Electronically Signed   By: Titus Dubin M.D.   On: 01/08/2021 12:50   CT ABDOMEN PELVIS W CONTRAST  Result Date: 02/07/2021 CLINICAL DATA:  wheezes throughout, whenever pt taken off any kind of O2, desaturates to 83%. Pt tachypnic. Abdominal distension EXAM: CT ANGIOGRAPHY CHEST CT ABDOMEN AND PELVIS WITH CONTRAST TECHNIQUE: Multidetector CT imaging of the chest was performed using the standard protocol during bolus administration of intravenous contrast. Multiplanar CT image reconstructions and MIPs were obtained to evaluate the vascular anatomy. Multidetector CT imaging of the abdomen and pelvis was performed using the standard protocol during bolus administration of intravenous contrast. CONTRAST:  3mL OMNIPAQUE IOHEXOL 350 MG/ML SOLN COMPARISON:  CT chest 03/23/2019 FINDINGS: CTA CHEST FINDINGS Cardiovascular: Satisfactory opacification of the pulmonary arteries to  the segmental level. Pulmonary artery filling defects within the left lower lobe segmental and subsegmental pulmonary arteries, right upper lobe segmental and subsegmental pulmonary arteries, right lower lobe subsegmental pulmonary arteries, right middle lobe segmental and subsegmental pulmonary arteries. Some of these appear central and cords like but are not visualized on the prior CT angiography chest 01/08/2021. Normal heart size. No pericardial effusion. Atherosclerotic plaque of the thoracic aorta. Mediastinum/Nodes: No enlarged mediastinal, hilar, or axillary lymph nodes. Thyroid gland and trachea demonstrate no significant findings. At least small to moderate hiatal hernia. Poor visualization of the esophagus due to timing of contrast. Lungs/Pleura: Hazy airspace  opacity peripherally within the right upper lobe (6:45). Likely subsegmental atelectasis at the left lower lobe. Similar-appearing 2.2 x 2.3 cm partially calcified pulmonary nodule (interval decrease in size from PET-CT 06/21/2018). Calcified pulmonary nodule at the left apex (6:28). Left peribronchovascular calcifications likely representing old granulomatous disease (6:40, 57). Associated traction bronchiectasis and architectural distortion. No pulmonary mass. No pleural effusion. No pneumothorax. Musculoskeletal: No chest wall abnormality. No suspicious lytic or blastic osseous lesions. Old healed right rib fractures. No acute displaced fracture. Similar-appearing compression fractures of the T9 and T11 vertebral bodies. Multilevel degenerative changes of the spine. Degenerative changes of the visualized right hand. Review of the MIP images confirms the above findings. CT ABDOMEN and PELVIS FINDINGS Hepatobiliary: No focal liver abnormality. Status post cholecystectomy. No biliary dilatation. Pancreas: No focal lesion. Normal pancreatic contour. No surrounding inflammatory changes. No main pancreatic ductal dilatation. Spleen: Normal in size without focal abnormality. Adrenals/Urinary Tract: No adrenal nodule bilaterally. Bilateral kidneys enhance symmetrically. No hydronephrosis. No hydroureter. The urinary bladder is distended with urine. Stomach/Bowel: Stomach is within normal limits. No evidence of bowel wall thickening or dilatation. Stool throughout the colon. The appendix not definitely identified. Vascular/Lymphatic: No abdominal aorta or iliac aneurysm. Moderate to severe calcified and noncalcified atherosclerotic plaque of the aorta and its branches. No abdominal, pelvic, or inguinal lymphadenopathy. Reproductive: Uterus and bilateral adnexa are unremarkable. Other: No intraperitoneal free fluid. No intraperitoneal free gas. No organized fluid collection. Musculoskeletal: Tiny fat containing umbilical  hernia. No suspicious lytic or blastic osseous lesions. No acute displaced fracture. Multilevel degenerative changes of the spine. Review of the MIP images confirms the above findings. IMPRESSION: 1. All three right lobe and left lower lobe segmental and subsegmental pulmonary emboli. No findings of right heart strain. Possible developing pulmonary infarction in the right upper lobe. 2. At least small to moderate hiatal hernia. Poor visualization of the esophagus due to timing of contrast. 3. Similar-appearing 2.2 x 2.3 cm left apex lobe partially calcified pulmonary nodule (interval decrease in size from PET-CT 06/21/2018). 4. Constipation and urinary bladder distended with urine with otherwise no acute intra-abdominal or intrapelvic abnormality. 5. Other imaging findings of potential clinical significance: Sequela of prior granulomatous disease of the left lung. Chronic T9 and T11 compression fractures. Aortic Atherosclerosis (ICD10-I70.0). These results were called by telephone at the time of interpretation on 02/07/2021 at 5:43 pm to provider [facility] , who verbally acknowledged these results. Electronically Signed   By: Iven Finn M.D.   On: 02/07/2021 18:01   US Venous Img Lower Bilateral (DVT)  Result Date: 02/08/2021 CLINICAL DATA:  Bilateral lower extremity edema.  Evaluate for DVT. EXAM: BILATERAL LOWER EXTREMITY VENOUS DOPPLER ULTRASOUND TECHNIQUE: Gray-scale sonography with graded compression, as well as color Doppler and duplex ultrasound were performed to evaluate the lower extremity deep venous systems from the level of the common femoral vein  and including the common femoral, femoral, profunda femoral, popliteal and calf veins including the posterior tibial, peroneal and gastrocnemius veins when visible. The superficial great saphenous vein was also interrogated. Spectral Doppler was utilized to evaluate flow at rest and with distal augmentation maneuvers in the common femoral, femoral and  popliteal veins. COMPARISON:  Right lower extremity venous Doppler ultrasound-09/20/2018 (negative) Left lower extremity venous Doppler ultrasound-05/28/2015 (negative) FINDINGS: RIGHT LOWER EXTREMITY Common Femoral Vein: No evidence of thrombus. Normal compressibility, respiratory phasicity and response to augmentation. Saphenofemoral Junction: No evidence of thrombus. Normal compressibility and flow on color Doppler imaging. Profunda Femoral Vein: No evidence of thrombus. Normal compressibility and flow on color Doppler imaging. Femoral Vein: No evidence of thrombus. Normal compressibility, respiratory phasicity and response to augmentation. Popliteal Vein: No evidence of thrombus. Normal compressibility, respiratory phasicity and response to augmentation. Calf Veins: No evidence of thrombus. Normal compressibility and flow on color Doppler imaging. Superficial Great Saphenous Vein: No evidence of thrombus. Normal compressibility. Other Findings:  None. LEFT LOWER EXTREMITY Common Femoral Vein: No evidence of thrombus. Normal compressibility, respiratory phasicity and response to augmentation. Saphenofemoral Junction: No evidence of thrombus. Normal compressibility and flow on color Doppler imaging. Profunda Femoral Vein: No evidence of thrombus. Normal compressibility and flow on color Doppler imaging. Femoral Vein: While the proximal and mid aspects of the left femoral vein appear widely patent, there is hypoechoic nonocclusive thrombus involving the distal aspect the left femoral vein (image 41) Popliteal Vein: There is hypoechoic near occlusive thrombus involving the left popliteal vein (images 44 and 45 Calf Veins: There is hypoechoic occlusive thrombus involving both paired left posterior tibial veins (image 50). The left peroneal vein appears patent where imaged. Superficial Great Saphenous Vein: No evidence of thrombus. Normal compressibility. Other Findings:  None. IMPRESSION: 1. The examination is  positive for mixed occlusive and nonocclusive DVT extending from the distal aspect the left femoral vein through the level of the left posterior tibial veins. 2. No evidence of DVT within the right lower extremity. Electronically Signed   By: Sandi Mariscal M.D.   On: 02/08/2021 15:10   DG Chest Portable 1 View  Result Date: 01/08/2021 CLINICAL DATA:  Shortness of breath and cough EXAM: PORTABLE CHEST 1 VIEW COMPARISON:  Eleven days ago FINDINGS: Low volume chest. Lucency under the right diaphragm has the appearance of colon. There is no edema, consolidation, effusion, or pneumothorax. Retrocardiac density is from tortuous aorta. Chronic nodular density at the left apex. IMPRESSION: Stable low volume chest.  No focal pneumonia. Electronically Signed   By: Monte Fantasia M.D.   On: 01/08/2021 11:25   ECHOCARDIOGRAM COMPLETE  Result Date: 02/08/2021    ECHOCARDIOGRAM REPORT   Patient Name:   JESTINE BICKNELL Date of Exam: 02/08/2021 Medical Rec #:  [id number]                     Height:       62.0 in Accession #:    [id number]                    Weight:       137.0 lb Date of Birth:  1940/09/28                      BSA:          1.628 m Patient Age:    20 years  BP:           114/45 mmHg Patient Gender: F                             HR:           62 bpm. Exam Location:  [facility] Procedure: 2D Echo, Color Doppler and Cardiac Doppler Indications:     Elevated troponin  History:         Patient has prior history of Echocardiogram examinations, most                  recent 10/10/2018. Signs/Symptoms:Murmur; Risk                  Factors:Dyslipidemia.  Sonographer:     Charmayne Sheer Referring Phys:  [id number] Rhetta Mura Diagnosing Phys: Ida Rogue MD IMPRESSIONS  1. Left ventricular ejection fraction, by estimation, is 60 to 65%. The left ventricle has normal function. The left ventricle has no regional wall motion abnormalities. Left ventricular diastolic parameters are consistent with  Grade II diastolic dysfunction (pseudonormalization).  2. Right ventricular systolic function is normal. The right ventricular size is normal. There is moderately elevated pulmonary artery systolic pressure. The estimated right ventricular systolic pressure is 20.3 mmHg.  3. The mitral valve is normal in structure. No evidence of mitral valve regurgitation. No evidence of mitral stenosis.  4. Tricuspid valve regurgitation is moderate. FINDINGS  Left Ventricle: Left ventricular ejection fraction, by estimation, is 60 to 65%. The left ventricle has normal function. The left ventricle has no regional wall motion abnormalities. The left ventricular internal cavity size was normal in size. There is  no left ventricular hypertrophy. Left ventricular diastolic parameters are consistent with Grade II diastolic dysfunction (pseudonormalization). Right Ventricle: The right ventricular size is normal. No increase in right ventricular wall thickness. Right ventricular systolic function is normal. There is moderately elevated pulmonary artery systolic pressure. The tricuspid regurgitant velocity is 3.56 m/s, and with an assumed right atrial pressure of 5 mmHg, the estimated right ventricular systolic pressure is 55.9 mmHg. Left Atrium: Left atrial size was normal in size. Right Atrium: Right atrial size was normal in size. Pericardium: There is no evidence of pericardial effusion. Mitral Valve: The mitral valve is normal in structure. No evidence of mitral valve regurgitation. No evidence of mitral valve stenosis. MV peak gradient, 6.1 mmHg. The mean mitral valve gradient is 2.0 mmHg. Tricuspid Valve: The tricuspid valve is normal in structure. Tricuspid valve regurgitation is moderate . No evidence of tricuspid stenosis. Aortic Valve: The aortic valve is normal in structure. Aortic valve regurgitation is not visualized. Mild to moderate aortic valve sclerosis/calcification is present, without any evidence of aortic stenosis.  Aortic valve mean gradient measures 10.2 mmHg.  Aortic valve peak gradient measures 18.7 mmHg. Aortic valve area, by VTI measures 2.13 cm. Pulmonic Valve: The pulmonic valve was normal in structure. Pulmonic valve regurgitation is not visualized. No evidence of pulmonic stenosis. Aorta: The aortic root is normal in size and structure. Venous: The inferior vena cava is normal in size with greater than 50% respiratory variability, suggesting right atrial pressure of 3 mmHg. IAS/Shunts: No atrial level shunt detected by color flow Doppler.  LEFT VENTRICLE PLAX 2D LVIDd:         4.00 cm  Diastology LVIDs:         2.50 cm  LV e' medial:    7.94 cm/s LV PW:  1.00 cm  LV E/e' medial:  12.7 LV IVS:        0.70 cm  LV e' lateral:   8.92 cm/s LVOT diam:     2.10 cm  LV E/e' lateral: 11.3 LV SV:         99 LV SV Index:   61 LVOT Area:     3.46 cm  LEFT ATRIUM           Index       RIGHT ATRIUM           Index LA diam:      3.40 cm 2.09 cm/m  RA Area:     13.30 cm LA Vol (A4C): 43.1 ml 26.48 ml/m RA Volume:   35.60 ml  21.87 ml/m  AORTIC VALVE                    PULMONIC VALVE AV Area (Vmax):    2.32 cm     PV Vmax:       1.25 m/s AV Area (Vmean):   2.11 cm     PV Vmean:      88.700 cm/s AV Area (VTI):     2.13 cm     PV VTI:        0.272 m AV Vmax:           216.25 cm/s  PV Peak grad:  6.2 mmHg AV Vmean:          152.750 cm/s PV Mean grad:  3.0 mmHg AV VTI:            0.465 m AV Peak Grad:      18.7 mmHg AV Mean Grad:      10.2 mmHg LVOT Vmax:         145.00 cm/s LVOT Vmean:        93.000 cm/s LVOT VTI:          0.286 m LVOT/AV VTI ratio: 0.61  AORTA Ao Root diam: 3.10 cm MITRAL VALVE                TRICUSPID VALVE MV Area (PHT): 2.73 cm     TR Peak grad:   50.7 mmHg MV Area VTI:   2.24 cm     TR Vmax:        356.00 cm/s MV Peak grad:  6.1 mmHg MV Mean grad:  2.0 mmHg     SHUNTS MV Vmax:       1.23 m/s     Systemic VTI:  0.29 m MV Vmean:      69.8 cm/s    Systemic Diam: 2.10 cm MV Decel Time: 278 msec MV E  velocity: 101.00 cm/s MV A velocity: 96.00 cm/s MV E/A ratio:  1.05 Ida Rogue MD Electronically signed by Ida Rogue MD Signature Date/Time: 02/08/2021/2:20:10 PM    Final        ASSESSMENT & PLAN:  1. AKI (acute kidney injury) ([place])   2. Primary adenocarcinoma of left lung ([place])   3. Malignant neoplasm of left female breast, unspecified estrogen receptor status, unspecified site of breast ([place])   4. Encounter for antineoplastic chemotherapy   5. Aromatase inhibitor use   6. Hypocalcemia   Cancer Staging Malignant neoplasm of left female breast [facility]) Staging form: Breast, AJCC 8th Edition - Clinical stage from 12/17/2018: Stage IB (cT1b, cN1, cM0, G1, ER+, PR+, HER2-) - Signed by Earlie Server, MD on 12/17/2018  Primary adenocarcinoma of lung [facility]) Staging  form: Lung, AJCC 8th Edition - Clinical stage from 09/28/2018: Stage IVA (cT2, cN3, cM1a) - Signed by Earlie Server, MD on 09/28/2018   Patient has 2 primaries.   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 Recent CT 02/07/2021 CT chest angiogram and abdomen/pelvis shows stable lung mass. No disease progression.  Labs are reviewed and discussed with patient. Continue Crizotinib 250mg  BID.   # Breast cancer  Continue Arimidex.  #hypocalcemia, Calcium level 8.8.   Continue calcium supplementation.   # acute PE and DVT, continue Eliquis 5mg  BID # AKI, check stat US kidney. Advise facility to catheterize if no voiding for 8 hours.   Follow-up  4 weeks, repeat CBC, CMP, see MD.  All questions were answered. The patient knows to call the clinic with any problems questions or concerns.  Earlie Server, MD, PhD 02/20/2021

## 2021-02-21 ENCOUNTER — Ambulatory Visit
Admission: RE | Admit: 2021-02-21 | Discharge: 2021-02-21 | Disposition: A | Discharge status: Home / Self Care | Payer: Medicare Other | Source: Ambulatory Visit | Attending: Oncology | Admitting: Oncology

## 2021-02-21 ENCOUNTER — Other Ambulatory Visit: Payer: Self-pay

## 2021-02-21 DIAGNOSIS — N179 Acute kidney failure, unspecified: Secondary | ICD-10-CM | POA: Insufficient documentation

## 2021-02-24 DIAGNOSIS — F0390 Unspecified dementia without behavioral disturbance: Secondary | ICD-10-CM | POA: Diagnosis not present

## 2021-02-24 DIAGNOSIS — N3281 Overactive bladder: Secondary | ICD-10-CM | POA: Diagnosis not present

## 2021-02-24 DIAGNOSIS — Z741 Need for assistance with personal care: Secondary | ICD-10-CM | POA: Diagnosis not present

## 2021-02-24 DIAGNOSIS — J9601 Acute respiratory failure with hypoxia: Secondary | ICD-10-CM | POA: Diagnosis not present

## 2021-02-24 DIAGNOSIS — R4189 Other symptoms and signs involving cognitive functions and awareness: Secondary | ICD-10-CM | POA: Diagnosis not present

## 2021-02-24 DIAGNOSIS — F329 Major depressive disorder, single episode, unspecified: Secondary | ICD-10-CM | POA: Diagnosis not present

## 2021-02-24 DIAGNOSIS — R2689 Other abnormalities of gait and mobility: Secondary | ICD-10-CM | POA: Diagnosis not present

## 2021-02-24 DIAGNOSIS — R296 Repeated falls: Secondary | ICD-10-CM | POA: Diagnosis not present

## 2021-02-24 DIAGNOSIS — M6281 Muscle weakness (generalized): Secondary | ICD-10-CM | POA: Diagnosis not present

## 2021-02-24 DIAGNOSIS — R0781 Pleurodynia: Secondary | ICD-10-CM | POA: Diagnosis not present

## 2021-02-24 DIAGNOSIS — R278 Other lack of coordination: Secondary | ICD-10-CM | POA: Diagnosis not present

## 2021-02-24 DIAGNOSIS — F419 Anxiety disorder, unspecified: Secondary | ICD-10-CM | POA: Diagnosis not present

## 2021-02-24 DIAGNOSIS — J90 Pleural effusion, not elsewhere classified: Secondary | ICD-10-CM | POA: Diagnosis not present

## 2021-02-24 DIAGNOSIS — K219 Gastro-esophageal reflux disease without esophagitis: Secondary | ICD-10-CM | POA: Diagnosis not present

## 2021-02-25 DIAGNOSIS — M6281 Muscle weakness (generalized): Secondary | ICD-10-CM | POA: Diagnosis not present

## 2021-02-25 DIAGNOSIS — R278 Other lack of coordination: Secondary | ICD-10-CM | POA: Diagnosis not present

## 2021-02-25 DIAGNOSIS — J90 Pleural effusion, not elsewhere classified: Secondary | ICD-10-CM | POA: Diagnosis not present

## 2021-02-25 DIAGNOSIS — J9601 Acute respiratory failure with hypoxia: Secondary | ICD-10-CM | POA: Diagnosis not present

## 2021-02-25 DIAGNOSIS — K219 Gastro-esophageal reflux disease without esophagitis: Secondary | ICD-10-CM | POA: Diagnosis not present

## 2021-02-25 DIAGNOSIS — R2689 Other abnormalities of gait and mobility: Secondary | ICD-10-CM | POA: Diagnosis not present

## 2021-02-26 DIAGNOSIS — K219 Gastro-esophageal reflux disease without esophagitis: Secondary | ICD-10-CM | POA: Diagnosis not present

## 2021-02-26 DIAGNOSIS — R278 Other lack of coordination: Secondary | ICD-10-CM | POA: Diagnosis not present

## 2021-02-26 DIAGNOSIS — R2689 Other abnormalities of gait and mobility: Secondary | ICD-10-CM | POA: Diagnosis not present

## 2021-02-26 DIAGNOSIS — M6281 Muscle weakness (generalized): Secondary | ICD-10-CM | POA: Diagnosis not present

## 2021-02-26 DIAGNOSIS — J9601 Acute respiratory failure with hypoxia: Secondary | ICD-10-CM | POA: Diagnosis not present

## 2021-02-26 DIAGNOSIS — J90 Pleural effusion, not elsewhere classified: Secondary | ICD-10-CM | POA: Diagnosis not present

## 2021-03-01 ENCOUNTER — Encounter: Payer: Self-pay | Admitting: Oncology

## 2021-03-01 DIAGNOSIS — E039 Hypothyroidism, unspecified: Secondary | ICD-10-CM | POA: Diagnosis not present

## 2021-03-01 DIAGNOSIS — N183 Chronic kidney disease, stage 3 unspecified: Secondary | ICD-10-CM | POA: Diagnosis not present

## 2021-03-01 DIAGNOSIS — R4181 Age-related cognitive decline: Secondary | ICD-10-CM | POA: Diagnosis not present

## 2021-03-01 DIAGNOSIS — Z853 Personal history of malignant neoplasm of breast: Secondary | ICD-10-CM | POA: Diagnosis not present

## 2021-03-01 DIAGNOSIS — K219 Gastro-esophageal reflux disease without esophagitis: Secondary | ICD-10-CM | POA: Diagnosis not present

## 2021-03-01 DIAGNOSIS — C3490 Malignant neoplasm of unspecified part of unspecified bronchus or lung: Secondary | ICD-10-CM | POA: Diagnosis not present

## 2021-03-01 DIAGNOSIS — K5900 Constipation, unspecified: Secondary | ICD-10-CM | POA: Diagnosis not present

## 2021-03-01 DIAGNOSIS — J9611 Chronic respiratory failure with hypoxia: Secondary | ICD-10-CM | POA: Diagnosis not present

## 2021-03-01 DIAGNOSIS — I872 Venous insufficiency (chronic) (peripheral): Secondary | ICD-10-CM | POA: Diagnosis not present

## 2021-03-01 DIAGNOSIS — F39 Unspecified mood [affective] disorder: Secondary | ICD-10-CM | POA: Diagnosis not present

## 2021-03-01 DIAGNOSIS — I700 Atherosclerosis of aorta: Secondary | ICD-10-CM | POA: Diagnosis not present

## 2021-03-01 DIAGNOSIS — G3184 Mild cognitive impairment, so stated: Secondary | ICD-10-CM | POA: Diagnosis not present

## 2021-03-04 DIAGNOSIS — J90 Pleural effusion, not elsewhere classified: Secondary | ICD-10-CM | POA: Diagnosis not present

## 2021-03-04 DIAGNOSIS — M6281 Muscle weakness (generalized): Secondary | ICD-10-CM | POA: Diagnosis not present

## 2021-03-04 DIAGNOSIS — J9601 Acute respiratory failure with hypoxia: Secondary | ICD-10-CM | POA: Diagnosis not present

## 2021-03-04 DIAGNOSIS — R2689 Other abnormalities of gait and mobility: Secondary | ICD-10-CM | POA: Diagnosis not present

## 2021-03-04 DIAGNOSIS — R278 Other lack of coordination: Secondary | ICD-10-CM | POA: Diagnosis not present

## 2021-03-04 DIAGNOSIS — K219 Gastro-esophageal reflux disease without esophagitis: Secondary | ICD-10-CM | POA: Diagnosis not present

## 2021-03-04 NOTE — Progress Notes (Signed)
[facility] Consult Note Telephone: [phone]  Fax: [phone]   Date of encounter: 02/20/2021  PATIENT NAME: Jamie Matthews [address] [place] [address]   [phone] (home)  DOB: Jul 17, 1940 MRN: [id number] PRIMARY CARE PROVIDER:    Venia Carbon, MD,  [place] [address] [phone]  REFERRING PROVIDER:   Venia Carbon, MD [address] [place],  [place] [address] [phone]  RESPONSIBLE PARTY:    Contact Information     Name Relation Home Work Mobile   Gracelyn Nurse Spouse [phone]  [phone]   [place] Sister   [phone]        I met face to face with patient in the facility. Palliative Care was asked to follow this patient by consultation request of  Venia Carbon, MD to address advance care planning and complex medical decision making. This is the initial visit.                                     ASSESSMENT AND PLAN / RECOMMENDATIONS:   Advance Care Planning/Goals of Care: Goals include to maximize quality of life and symptom management. Patient/health care surrogate gave his/her permission to discuss.Our advance care planning conversation included a discussion about:    The value and importance of advance care planning  Experiences with loved ones who have been seriously ill or have died  Exploration of personal, cultural or spiritual beliefs that might influence medical decisions  Exploration of goals of care in the event of a sudden injury or illness  Identification and preparation of a healthcare agent  Code status reviewed; continue DNR. CODE STATUS: DNR  Education provided on palliative medicine vs. Hospice services. Patient wishes to continue treatment for her breast and lung cancer. Will monitor for comfort and symptom management needs. Palliative medicine will continue to provide supportive care.  Symptom  Management/Plan:  Malignant neoplasm of left breast-patient to continue Arimidex as directed. Follow up with oncology as scheduled.  Primary adenocarcinoma of left lung-stage IV. Recent CT scan shows stable lung mass. Patient to continue Crizotinib as directed. Follow up with oncology as scheduled.   Shortness of breath-secondary to her lung CA, respiratory failure. Continue oxygen at 2lpm, albuterol 2 puffs every 6 hours PRN shortness of breath, wheezing.   Generalized weakness-secondary to breast and lung cancer. Continue PT/OT as directed.  Constipation-continue Amitiza and Miralax as directed.   Follow up Palliative Care Visit: Palliative care will continue to follow for complex medical decision making, advance care planning, and clarification of goals. Return in 8 weeks or prn.  I spent 35 minutes providing this consultation. More than 50% of the time in this consultation was spent in counseling and care coordination.    PPS: 50%  HOSPICE ELIGIBILITY/DIAGNOSIS: TBD  Chief Complaint: Palliative Medicine initial consult.  HISTORY OF PRESENT ILLNESS:  Jamie Matthews is a 80 y.o. year old female  with Breast cancer, lung cancer, acute respiratory failure with hypoxia,  dementia, anxiety, hypothyroidism, hyperlipidemia, MDD, GERD, OAB. Patient recently hospitalized 8/16-8/19/22 due to hypoxia, acute respiratory failure; hospitalization 9/15-9/19/22 due to acute PE and LLE DVT-currently on Eliquis.  Patient resides at [facility] in the health center. She does require assistance with adl's. PT/OT ordered. She denies pain or discomfort; occasional shortness of breath, nausea. She does report constipation; receives [place]. She  has an appointment later today with oncology to follow up with her breast cancer and lung cancer. Patient is currently receiving Arimidex for her breast cancer. Continues Crizotinib for her stage IV lund adenocarcinoma. CT on 02/07/21 shows stable lung  mass. A 10-point review of systems is negative, except for the pertinent positives and negatives detailed in the HPI.    History obtained from review of EMR, discussion with primary team, and interview with family, facility staff/caregiver and/or Ms. Bernadene Person.  I reviewed available labs, medications, imaging, studies and related documents from the EMR.  Records reviewed and summarized above.     Physical Exam:  Constitutional: NAD General: frail appearing EYES: anicteric sclera, lids intact, no discharge  ENMT: intact hearing, oral mucous membranes moist, dentition intact CV: S1S2, RRR, 1+ LE edema Pulmonary: LCTA, no increased work of breathing, no cough, room air Abdomen: normo-active BS + 4 quadrants, soft and non tender GU: deferred MSK: moves all extremities Skin: warm and dry, no rashes or wounds on visible skin Neuro:  generalized weakness, A & O x 3, forgetful Psych: non-anxious affect Hem/lymph/immuno: no widespread bruising CURRENT PROBLEM LIST:  Patient Active Problem List   Diagnosis Date Noted   Elevated troponin 02/08/2021   Constipation 02/08/2021   Acute urinary retention 02/08/2021   Prolonged QT interval 02/08/2021   Allergic rhinitis 02/08/2021   Acquired hypothyroidism 02/08/2021   Acute deep vein thrombosis (DVT) of proximal vein of left lower extremity (HCC)    Pulmonary embolism (HCC) 02/07/2021   Acute respiratory failure with hypoxia (HCC) 02/05/2021   Shortness of breath 01/08/2021   Aortic valve stenosis 01/19/2020   Left hip pain 12/05/2019   Hypocalcemia 11/02/2019   Trochanteric bursitis 09/20/2019   Pruritus 09/06/2019   Overactive bladder 09/06/2019   Chronic pain of right knee 09/06/2019   Multiple rib fractures 07/15/2019   Fall as cause of accidental injury at home as place of occurrence 07/15/2019   Encounter for antineoplastic chemotherapy 12/17/2018   Lower leg edema 12/17/2018   Aromatase inhibitor use 10/24/2018   Osteopenia  10/24/2018   Fever 10/10/2018   Closed displaced fracture of pubis ([place]) 09/20/2018   Localized swelling of right lower leg 09/20/2018   AKI (acute kidney injury) ([place]) 08/17/2018   Myofascial pain 07/28/2018   Primary adenocarcinoma of lung ([place]) 07/21/2018   Malignant neoplasm of left female breast ([place]) 07/21/2018   Adenopathy    Goals of care, counseling/discussion 06/26/2018   IBS (irritable bowel syndrome) 05/25/2018   BPV (benign positional vertigo) 07/30/2017   Unilateral primary osteoarthritis, left knee 06/24/2017   Bunion of great toe of right foot 06/09/2017   Dizziness 03/24/2017   Decreased appetite 12/03/2016   Fatigue 09/22/2016   Insomnia 09/22/2016   Depression, recurrent ([place]) 09/22/2016   Chronic cough 08/21/2016   Primary localized osteoarthrosis, hand 03/19/2016   OA (osteoarthritis) of knee 03/17/2016   Stress due to illness of family member 10/10/2014   Ileitis 06/22/2014   Chronic insomnia 09/20/2013   DNR (do not resuscitate) 11/15/2012   Other constipation 04/01/2012   Memory loss 02/05/2012   Urinary incontinence    Major depressive disorder, recurrent, moderate    Gastroesophageal reflux disease    Hyperlipidemia    PAST MEDICAL HISTORY:  Active Ambulatory Problems    Diagnosis Date Noted   Urinary incontinence    Major depressive disorder, recurrent, moderate    Gastroesophageal reflux disease    Hyperlipidemia    Memory loss 02/05/2012   Other  constipation 04/01/2012   DNR (do not resuscitate) 11/15/2012   Chronic insomnia 09/20/2013   Ileitis 06/22/2014   Stress due to illness of family member 10/10/2014   OA (osteoarthritis) of knee 03/17/2016   Chronic cough 08/21/2016   Fatigue 09/22/2016   Insomnia 09/22/2016   Depression, recurrent ([place]) 09/22/2016   Decreased appetite 12/03/2016   Dizziness 03/24/2017   Bunion of great toe of right foot 06/09/2017   Primary localized osteoarthrosis, hand 03/19/2016   Unilateral primary  osteoarthritis, left knee 06/24/2017   BPV (benign positional vertigo) 07/30/2017   IBS (irritable bowel syndrome) 05/25/2018   Goals of care, counseling/discussion 06/26/2018   Adenopathy    Primary adenocarcinoma of lung ([place]) 07/21/2018   Malignant neoplasm of left female breast ([place]) 07/21/2018   Myofascial pain 07/28/2018   AKI (acute kidney injury) ([place]) 08/17/2018   Closed displaced fracture of pubis ([place]) 09/20/2018   Localized swelling of right lower leg 09/20/2018   Fever 10/10/2018   Aromatase inhibitor use 10/24/2018   Osteopenia 10/24/2018   Encounter for antineoplastic chemotherapy 12/17/2018   Lower leg edema 12/17/2018   Multiple rib fractures 07/15/2019   Fall as cause of accidental injury at home as place of occurrence 07/15/2019   Pruritus 09/06/2019   Overactive bladder 09/06/2019   Chronic pain of right knee 09/06/2019   Trochanteric bursitis 09/20/2019   Hypocalcemia 11/02/2019   Left hip pain 12/05/2019   Aortic valve stenosis 01/19/2020   Shortness of breath 01/08/2021   Acute respiratory failure with hypoxia ([place]) 02/05/2021   Pulmonary embolism ([place]) 02/07/2021   Elevated troponin 02/08/2021   Constipation 02/08/2021   Acute urinary retention 02/08/2021   Prolonged QT interval 02/08/2021   Allergic rhinitis 02/08/2021   Acquired hypothyroidism 02/08/2021   Acute deep vein thrombosis (DVT) of proximal vein of left lower extremity [facility])    Resolved Ambulatory Problems    Diagnosis Date Noted   Diverticulitis    Dysphagia, unspecified(787.20) 11/10/2012   Routine general medical examination at a health care facility 11/15/2012   Tick bite of abdomen 01/19/2013   Nausea with vomiting 03/24/2013   Other malaise and fatigue 06/16/2013   Right shoulder pain 06/16/2013   Emesis 07/08/2013   Allergic dermatitis 09/20/2013   Medicare annual wellness visit, subsequent 12/13/2013   Cough 07/17/2014   Fatigue 02/15/2015   Abdominal bloating 02/15/2015    Cough 03/09/2015   Nausea with vomiting 07/09/2015   Medicare annual wellness visit, subsequent 07/18/2015   Well woman exam 07/18/2015   Acute upper respiratory infection 10/02/2015   Fatigue 03/17/2016   Nausea vomiting and diarrhea 04/07/2016   Sinusitis 04/28/2016   Conjunctivitis 04/28/2016   Cough 06/10/2016   Lung mass    At risk for falls 10/24/2018   Past Medical History:  Diagnosis Date   Anxiety    Arthritis    Belching    Bladder disorder    Bowel dysfunction    Cancer ([place])    Depression    Fibromyalgia    GERD (gastroesophageal reflux disease)    Internal hemorrhoids    Lung cancer ([place]) 2020   Memory deficits    Murmur    Pneumonia 11/18/12   Vertigo    SOCIAL HX:  Social History   Tobacco Use   Smoking status: Never   Smokeless tobacco: Never  Substance Use Topics   Alcohol use: Not Currently    Comment: rare wine   FAMILY HX:  Family History  Problem Relation Age of Onset  Diabetes Father    Heart disease Father    Lymphoma Father    Heart disease Mother    Breast cancer Sister 5   Colon cancer Neg Hx    Esophageal cancer Neg Hx    Rectal cancer Neg Hx    Stomach cancer Neg Hx    Bladder Cancer Neg Hx    Kidney cancer Neg Hx     ALLERGIES:  Allergies  Allergen Reactions   Sulfa Antibiotics Rash     PERTINENT MEDICATIONS:  Outpatient Encounter Medications as of 02/20/2021  Medication Sig   acetaminophen (TYLENOL) 325 MG tablet Take 325 mg by mouth every 6 (six) hours as needed for mild pain.    albuterol (VENTOLIN HFA) 108 (90 Base) MCG/ACT inhaler Inhale 2 puffs into the lungs every 6 (six) hours as needed for wheezing or shortness of breath.   alum & mag hydroxide-simeth (MAALOX/MYLANTA) 200-200-20 MG/5ML suspension Take 30 mLs by mouth every 6 (six) hours as needed for indigestion or heartburn.   anastrozole (ARIMIDEX) 1 MG tablet Take 1 tablet (1 mg total) by mouth daily.   apixaban (ELIQUIS) 5 MG TABS tablet Take 2 tablets (10  mg total) by mouth 2 (two) times daily for 4 days, THEN 1 tablet (5 mg total) 2 (two) times daily.   benzonatate (TESSALON) 100 MG capsule TAKE 1 CAPSULE BY MOUTH 3 TIMES DAILY ASNEEDED FOR COUGH.   Calcium-Magnesium-Vitamin D (CALCIUM 1200+D3 PO) Take 1 tablet by mouth daily.   crizotinib (XALKORI) 250 MG capsule Take 1 capsule (250 mg total) by mouth 2 (two) times daily.   Dextromethorphan-guaiFENesin (MUCINEX DM) 30-600 MG TB12 Take 1 tablet by mouth 2 (two) times daily as needed (for congestion/cough).   fluticasone (FLONASE) 50 MCG/ACT nasal spray Place 2 sprays into both nostrils daily.   furosemide (LASIX) 40 MG tablet Take 2 tablets (80 mg total) by mouth daily.   levothyroxine (SYNTHROID) 25 MCG tablet TAKE 1 TABLET BY MOUTH DAILY   lubiprostone (AMITIZA) 24 MCG capsule TAKE ONE CAPSULE TWICE A DAY WITH MEALS   memantine (NAMENDA) 10 MG tablet Take 1 tablet (10 mg total) by mouth 2 (two) times daily.   Multiple Vitamin (MULTIVITAMIN WITH MINERALS) TABS tablet Take 1 tablet by mouth daily.   ondansetron (ZOFRAN) 8 MG tablet TAKE ONE TABLET EVERY EIGHT HOURS AS NEEDED FOR NAUSEA / VOMITING   polyethylene glycol (MIRALAX / GLYCOLAX) 17 g packet Take 17 g by mouth 2 (two) times daily.   potassium chloride SA (KLOR-CON) 20 MEQ tablet Take 20 mEq by mouth 2 (two) times daily.   senna-docusate (SENOKOT-S) 8.6-50 MG tablet Take 1 tablet by mouth 2 (two) times daily between meals as needed for mild constipation.   venlafaxine XR (EFFEXOR-XR) 150 MG 24 hr capsule TAKE 1 CAPSULE BY MOUTH EVERY DAY   Vitamin D, Ergocalciferol, (DRISDOL) 1.25 MG (50000 UNIT) CAPS capsule Take 50,000 Units by mouth every Sunday at 6pm.   No facility-administered encounter medications on file as of 02/20/2021.   Thank you for the opportunity to participate in the care of Ms. Bernadene Person.  The palliative care team will continue to follow. Please call our office at [phone] if we can be of additional assistance.    Ezekiel Slocumb, NP   COVID-19 PATIENT SCREENING TOOL Asked and negative response unless otherwise noted:  Have you had symptoms of covid, tested positive or been in contact with someone with symptoms/positive test in the past 5-10 days? No

## 2021-03-07 DIAGNOSIS — R2689 Other abnormalities of gait and mobility: Secondary | ICD-10-CM | POA: Diagnosis not present

## 2021-03-07 DIAGNOSIS — J9601 Acute respiratory failure with hypoxia: Secondary | ICD-10-CM | POA: Diagnosis not present

## 2021-03-07 DIAGNOSIS — M6281 Muscle weakness (generalized): Secondary | ICD-10-CM | POA: Diagnosis not present

## 2021-03-07 DIAGNOSIS — R278 Other lack of coordination: Secondary | ICD-10-CM | POA: Diagnosis not present

## 2021-03-07 DIAGNOSIS — K219 Gastro-esophageal reflux disease without esophagitis: Secondary | ICD-10-CM | POA: Diagnosis not present

## 2021-03-07 DIAGNOSIS — J90 Pleural effusion, not elsewhere classified: Secondary | ICD-10-CM | POA: Diagnosis not present

## 2021-03-08 ENCOUNTER — Other Ambulatory Visit: Payer: Self-pay | Admitting: Oncology

## 2021-03-08 ENCOUNTER — Other Ambulatory Visit (HOSPITAL_COMMUNITY): Payer: Self-pay

## 2021-03-08 DIAGNOSIS — J90 Pleural effusion, not elsewhere classified: Secondary | ICD-10-CM | POA: Diagnosis not present

## 2021-03-08 DIAGNOSIS — R2689 Other abnormalities of gait and mobility: Secondary | ICD-10-CM | POA: Diagnosis not present

## 2021-03-08 DIAGNOSIS — J9601 Acute respiratory failure with hypoxia: Secondary | ICD-10-CM | POA: Diagnosis not present

## 2021-03-08 DIAGNOSIS — R278 Other lack of coordination: Secondary | ICD-10-CM | POA: Diagnosis not present

## 2021-03-08 DIAGNOSIS — M6281 Muscle weakness (generalized): Secondary | ICD-10-CM | POA: Diagnosis not present

## 2021-03-08 DIAGNOSIS — K219 Gastro-esophageal reflux disease without esophagitis: Secondary | ICD-10-CM | POA: Diagnosis not present

## 2021-03-09 DIAGNOSIS — M6281 Muscle weakness (generalized): Secondary | ICD-10-CM | POA: Diagnosis not present

## 2021-03-09 DIAGNOSIS — J9601 Acute respiratory failure with hypoxia: Secondary | ICD-10-CM | POA: Diagnosis not present

## 2021-03-09 DIAGNOSIS — R2689 Other abnormalities of gait and mobility: Secondary | ICD-10-CM | POA: Diagnosis not present

## 2021-03-09 DIAGNOSIS — K219 Gastro-esophageal reflux disease without esophagitis: Secondary | ICD-10-CM | POA: Diagnosis not present

## 2021-03-09 DIAGNOSIS — J90 Pleural effusion, not elsewhere classified: Secondary | ICD-10-CM | POA: Diagnosis not present

## 2021-03-09 DIAGNOSIS — R278 Other lack of coordination: Secondary | ICD-10-CM | POA: Diagnosis not present

## 2021-03-10 ENCOUNTER — Encounter: Payer: Self-pay | Admitting: Oncology

## 2021-03-10 MED ORDER — CRIZOTINIB 250 MG PO CAPS
250.0000 mg | ORAL_CAPSULE | Freq: Two times a day (BID) | ORAL | 1 refills | Status: DC
  Filled 2021-03-10: qty 60, 30d supply, fill #0
  Filled 2021-04-10: qty 60, 30d supply, fill #1

## 2021-03-11 ENCOUNTER — Other Ambulatory Visit (HOSPITAL_COMMUNITY): Payer: Self-pay

## 2021-03-12 DIAGNOSIS — R278 Other lack of coordination: Secondary | ICD-10-CM | POA: Diagnosis not present

## 2021-03-12 DIAGNOSIS — J9601 Acute respiratory failure with hypoxia: Secondary | ICD-10-CM | POA: Diagnosis not present

## 2021-03-12 DIAGNOSIS — M6281 Muscle weakness (generalized): Secondary | ICD-10-CM | POA: Diagnosis not present

## 2021-03-12 DIAGNOSIS — J90 Pleural effusion, not elsewhere classified: Secondary | ICD-10-CM | POA: Diagnosis not present

## 2021-03-12 DIAGNOSIS — K219 Gastro-esophageal reflux disease without esophagitis: Secondary | ICD-10-CM | POA: Diagnosis not present

## 2021-03-12 DIAGNOSIS — R2689 Other abnormalities of gait and mobility: Secondary | ICD-10-CM | POA: Diagnosis not present

## 2021-03-13 DIAGNOSIS — R2689 Other abnormalities of gait and mobility: Secondary | ICD-10-CM | POA: Diagnosis not present

## 2021-03-13 DIAGNOSIS — K219 Gastro-esophageal reflux disease without esophagitis: Secondary | ICD-10-CM | POA: Diagnosis not present

## 2021-03-13 DIAGNOSIS — J90 Pleural effusion, not elsewhere classified: Secondary | ICD-10-CM | POA: Diagnosis not present

## 2021-03-13 DIAGNOSIS — J9601 Acute respiratory failure with hypoxia: Secondary | ICD-10-CM | POA: Diagnosis not present

## 2021-03-13 DIAGNOSIS — M6281 Muscle weakness (generalized): Secondary | ICD-10-CM | POA: Diagnosis not present

## 2021-03-13 DIAGNOSIS — K5900 Constipation, unspecified: Secondary | ICD-10-CM | POA: Diagnosis not present

## 2021-03-13 DIAGNOSIS — R278 Other lack of coordination: Secondary | ICD-10-CM | POA: Diagnosis not present

## 2021-03-14 ENCOUNTER — Other Ambulatory Visit (HOSPITAL_COMMUNITY): Payer: Self-pay

## 2021-03-20 DIAGNOSIS — R278 Other lack of coordination: Secondary | ICD-10-CM | POA: Diagnosis not present

## 2021-03-20 DIAGNOSIS — R2689 Other abnormalities of gait and mobility: Secondary | ICD-10-CM | POA: Diagnosis not present

## 2021-03-20 DIAGNOSIS — J9601 Acute respiratory failure with hypoxia: Secondary | ICD-10-CM | POA: Diagnosis not present

## 2021-03-20 DIAGNOSIS — J90 Pleural effusion, not elsewhere classified: Secondary | ICD-10-CM | POA: Diagnosis not present

## 2021-03-20 DIAGNOSIS — M6281 Muscle weakness (generalized): Secondary | ICD-10-CM | POA: Diagnosis not present

## 2021-03-20 DIAGNOSIS — K219 Gastro-esophageal reflux disease without esophagitis: Secondary | ICD-10-CM | POA: Diagnosis not present

## 2021-03-21 ENCOUNTER — Inpatient Hospital Stay (HOSPITAL_BASED_OUTPATIENT_CLINIC_OR_DEPARTMENT_OTHER): Payer: Medicare Other | Admitting: Oncology

## 2021-03-21 ENCOUNTER — Encounter: Payer: Self-pay | Admitting: Oncology

## 2021-03-21 ENCOUNTER — Other Ambulatory Visit: Payer: Self-pay

## 2021-03-21 ENCOUNTER — Inpatient Hospital Stay: Payer: Medicare Other | Attending: Oncology

## 2021-03-21 VITALS — BP 91/68 | HR 73 | Temp 97.8°F

## 2021-03-21 DIAGNOSIS — R7989 Other specified abnormal findings of blood chemistry: Secondary | ICD-10-CM | POA: Diagnosis not present

## 2021-03-21 DIAGNOSIS — M47814 Spondylosis without myelopathy or radiculopathy, thoracic region: Secondary | ICD-10-CM | POA: Insufficient documentation

## 2021-03-21 DIAGNOSIS — Z7901 Long term (current) use of anticoagulants: Secondary | ICD-10-CM | POA: Insufficient documentation

## 2021-03-21 DIAGNOSIS — C50812 Malignant neoplasm of overlapping sites of left female breast: Secondary | ICD-10-CM | POA: Diagnosis not present

## 2021-03-21 DIAGNOSIS — E785 Hyperlipidemia, unspecified: Secondary | ICD-10-CM | POA: Diagnosis not present

## 2021-03-21 DIAGNOSIS — Z882 Allergy status to sulfonamides status: Secondary | ICD-10-CM | POA: Insufficient documentation

## 2021-03-21 DIAGNOSIS — C773 Secondary and unspecified malignant neoplasm of axilla and upper limb lymph nodes: Secondary | ICD-10-CM | POA: Insufficient documentation

## 2021-03-21 DIAGNOSIS — J479 Bronchiectasis, uncomplicated: Secondary | ICD-10-CM | POA: Diagnosis not present

## 2021-03-21 DIAGNOSIS — C3492 Malignant neoplasm of unspecified part of left bronchus or lung: Secondary | ICD-10-CM

## 2021-03-21 DIAGNOSIS — Z8719 Personal history of other diseases of the digestive system: Secondary | ICD-10-CM | POA: Diagnosis not present

## 2021-03-21 DIAGNOSIS — I6782 Cerebral ischemia: Secondary | ICD-10-CM | POA: Diagnosis not present

## 2021-03-21 DIAGNOSIS — K449 Diaphragmatic hernia without obstruction or gangrene: Secondary | ICD-10-CM | POA: Diagnosis not present

## 2021-03-21 DIAGNOSIS — C50912 Malignant neoplasm of unspecified site of left female breast: Secondary | ICD-10-CM

## 2021-03-21 DIAGNOSIS — Z79811 Long term (current) use of aromatase inhibitors: Secondary | ICD-10-CM | POA: Insufficient documentation

## 2021-03-21 DIAGNOSIS — Z5111 Encounter for antineoplastic chemotherapy: Secondary | ICD-10-CM

## 2021-03-21 DIAGNOSIS — K2289 Other specified disease of esophagus: Secondary | ICD-10-CM | POA: Insufficient documentation

## 2021-03-21 DIAGNOSIS — J90 Pleural effusion, not elsewhere classified: Secondary | ICD-10-CM | POA: Insufficient documentation

## 2021-03-21 DIAGNOSIS — Z66 Do not resuscitate: Secondary | ICD-10-CM | POA: Diagnosis not present

## 2021-03-21 DIAGNOSIS — I2693 Single subsegmental pulmonary embolism without acute cor pulmonale: Secondary | ICD-10-CM | POA: Diagnosis not present

## 2021-03-21 DIAGNOSIS — E871 Hypo-osmolality and hyponatremia: Secondary | ICD-10-CM

## 2021-03-21 DIAGNOSIS — C3412 Malignant neoplasm of upper lobe, left bronchus or lung: Secondary | ICD-10-CM | POA: Diagnosis not present

## 2021-03-21 DIAGNOSIS — Z86718 Personal history of other venous thrombosis and embolism: Secondary | ICD-10-CM | POA: Diagnosis not present

## 2021-03-21 DIAGNOSIS — B370 Candidal stomatitis: Secondary | ICD-10-CM

## 2021-03-21 DIAGNOSIS — Z833 Family history of diabetes mellitus: Secondary | ICD-10-CM | POA: Insufficient documentation

## 2021-03-21 DIAGNOSIS — K429 Umbilical hernia without obstruction or gangrene: Secondary | ICD-10-CM | POA: Insufficient documentation

## 2021-03-21 DIAGNOSIS — Z170 Estrogen receptor positive status [ER+]: Secondary | ICD-10-CM | POA: Insufficient documentation

## 2021-03-21 DIAGNOSIS — I82442 Acute embolism and thrombosis of left tibial vein: Secondary | ICD-10-CM | POA: Diagnosis not present

## 2021-03-21 DIAGNOSIS — R600 Localized edema: Secondary | ICD-10-CM | POA: Diagnosis not present

## 2021-03-21 DIAGNOSIS — N179 Acute kidney failure, unspecified: Secondary | ICD-10-CM

## 2021-03-21 DIAGNOSIS — Z8249 Family history of ischemic heart disease and other diseases of the circulatory system: Secondary | ICD-10-CM | POA: Insufficient documentation

## 2021-03-21 DIAGNOSIS — R413 Other amnesia: Secondary | ICD-10-CM | POA: Diagnosis not present

## 2021-03-21 DIAGNOSIS — Z803 Family history of malignant neoplasm of breast: Secondary | ICD-10-CM | POA: Insufficient documentation

## 2021-03-21 DIAGNOSIS — M7989 Other specified soft tissue disorders: Secondary | ICD-10-CM | POA: Insufficient documentation

## 2021-03-21 DIAGNOSIS — Z808 Family history of malignant neoplasm of other organs or systems: Secondary | ICD-10-CM | POA: Insufficient documentation

## 2021-03-21 DIAGNOSIS — Z9049 Acquired absence of other specified parts of digestive tract: Secondary | ICD-10-CM | POA: Insufficient documentation

## 2021-03-21 LAB — CBC WITH DIFFERENTIAL/PLATELET
Abs Immature Granulocytes: 0.73 10*3/uL — ABNORMAL HIGH (ref 0.00–0.07)
Basophils Absolute: 0 10*3/uL (ref 0.0–0.1)
Basophils Relative: 0 %
Eosinophils Absolute: 0.2 10*3/uL (ref 0.0–0.5)
Eosinophils Relative: 1 %
HCT: 30.1 % — ABNORMAL LOW (ref 36.0–46.0)
Hemoglobin: 9.8 g/dL — ABNORMAL LOW (ref 12.0–15.0)
Immature Granulocytes: 6 %
Lymphocytes Relative: 12 %
Lymphs Abs: 1.5 10*3/uL (ref 0.7–4.0)
MCH: 30.4 pg (ref 26.0–34.0)
MCHC: 32.6 g/dL (ref 30.0–36.0)
MCV: 93.5 fL (ref 80.0–100.0)
Monocytes Absolute: 1.8 10*3/uL — ABNORMAL HIGH (ref 0.1–1.0)
Monocytes Relative: 14 %
Neutro Abs: 8.7 10*3/uL — ABNORMAL HIGH (ref 1.7–7.7)
Neutrophils Relative %: 67 %
Platelets: 769 10*3/uL — ABNORMAL HIGH (ref 150–400)
RBC: 3.22 MIL/uL — ABNORMAL LOW (ref 3.87–5.11)
RDW: 16.4 % — ABNORMAL HIGH (ref 11.5–15.5)
Smear Review: NORMAL
WBC: 12.9 10*3/uL — ABNORMAL HIGH (ref 4.0–10.5)
nRBC: 0 % (ref 0.0–0.2)

## 2021-03-21 LAB — COMPREHENSIVE METABOLIC PANEL
ALT: 38 U/L (ref 0–44)
AST: 34 U/L (ref 15–41)
Albumin: 2.6 g/dL — ABNORMAL LOW (ref 3.5–5.0)
Alkaline Phosphatase: 190 U/L — ABNORMAL HIGH (ref 38–126)
Anion gap: 9 (ref 5–15)
BUN: 24 mg/dL — ABNORMAL HIGH (ref 8–23)
CO2: 25 mmol/L (ref 22–32)
Calcium: 7.1 mg/dL — ABNORMAL LOW (ref 8.9–10.3)
Chloride: 92 mmol/L — ABNORMAL LOW (ref 98–111)
Creatinine, Ser: 1.31 mg/dL — ABNORMAL HIGH (ref 0.44–1.00)
GFR, Estimated: 41 mL/min — ABNORMAL LOW (ref >60)
Glucose, Bld: 192 mg/dL — ABNORMAL HIGH (ref 70–99)
Potassium: 4.5 mmol/L (ref 3.5–5.1)
Sodium: 126 mmol/L — ABNORMAL LOW (ref 135–145)
Total Bilirubin: 0.6 mg/dL (ref 0.3–1.2)
Total Protein: 6.7 g/dL (ref 6.5–8.1)

## 2021-03-21 MED ORDER — NYSTATIN 100000 UNIT/ML MT SUSP: 5.0000 mL | Freq: Four times a day (QID) | OROMUCOSAL | 0 refills | Status: DC

## 2021-03-21 NOTE — Progress Notes (Signed)
Hematology/Oncology follow up note [facility] [phone] [phone]   Patient Care Team: Venia Carbon, MD as PCP - General (Internal Medicine) Pyrtle, Lajuan Lines, MD as Consulting Physician (Gastroenterology) Telford Nab, RN as Registered Nurse Earlie Server, MD as Consulting Physician (Hematology and Oncology) Debbora Dus, [facility] as Pharmacist (Pharmacist) Burnice Logan, [facility] (Inactive) as Pharmacist (Pharmacist) Ralene Bathe, MD (Dermatology) Festus Aloe, MD as Consulting Physician (Urology)   REASON FOR VISIT:  Follow up for management of lung cancer and breast cancer  HISTORY OF PRESENTING ILLNESS:  Jamie Matthews is a  80 y.o.  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 05/05/2018 bilateral diagnostic breast mammogram showed suspicious mass 1.1cm  in the 12:00 retroareolar region of the left breast and the left axillary lymph node. 06/08/2018 patient status post a left breast retroareolar and left axillary lymph node biopsy. Pathology showed invasive mammary carcinoma, no special type, grade 1, left axillary lymph node positive for invasive mammary carcinoma clinically metastatic.  Background lymph node architecture is not identified. ER> 90% PR> 90%, HER-2 negative.  PET scan done which unfortunately showed additional hypermetabolic bilateral hilar lymph nodes as well as left upper lobe lung mass which may represent a focus of metastatic disease from breast, or primary lung neoplasm.  There is multiple additional small pulmonary nodules are scattered throughout both lungs which are worrisome for metastasis.  Index nodule within the medial right upper lobe measures 7 mm,, peri-broncho-vascular nodule in the right lower lobe measures 1.2 cm.  # Stage IV lung cancer-  Biopsy of lung mass left upper lobe showed non-small cell lung cancer, favor adenocarcinoma.  #NGS came back  patient has PD L1 is 70% TPS, MET fusion mutation.  #Mid-March 2020 started on crizotinib  Bilateral lower extremity edema, right >left, Ultrasound venous right 09/20/2018 no DVT.  2D echo 10/10/2018 showed LVEF 55 to 60% Most likely secondary to crizotinib side effects.  # 12/15/2018 CT chest images were independently reviewed and discussed with patient. Dominant left upper lobe nodule and multiple scattered irregular pulmonary parenchymal nodular lesions are stable. Mild basilar pulmonary parenchymal septal thickening is new and can be seen with pulmonary edema. Patient reports no shortness of breath asymptomatic  #12/29/2018 unilateral left diagnostic mammogram with ultrasound images were independently reviewed and discussed with patient. Slight interval reduction of the size of the left retroareolar breast mass, 9 x 6 x 7 mm, comparing to 10 x 9 x 8 mm. Axillary lymph node measured 1.3 x 1.0 x 1.0 cm, previously 2.2 x 1.4 x 1.5 cm,  there were 2 other abdominal lymph nodes in the left axilla, which were not reported in previous ultrasound. Discussed with patient that overall, breast cancer responded to endocrine treatments.  Additional 2 lymph nodes need to be closely monitored.  Recommend patient to continue Arimidex.  01/08/2021 - 01/11/2021, patient was admitted to the hospital due to hypoxia, acute respiratory failure.  COVID/influenza were negative.  01/08/2021, CT angio chest PE protocol showed no PE new small bilateral pleural effusion.  New moderate hiatal hernia and a diffuse distended esophagus with air-fluid level.  Suggestive of gastroesophageal reflux.  Unchanged 2.3 cm faintly calcified solid pulmonary nodule in the apical left upper lobe. It was felt that the acute respiratory failure with hypoxia is probably due to upper respiratory infection. Patient was treated with IV steroid, and discharged on a tapering course of steroids. Procalcitonin is less than  0.1. Crizotinib was held  during the admission.  Admitted from 02/07/2021- 02/11/2021 due to acute PE, and left lower extremity DVT, started on heparin switch to Eliquis 50m BID.  INTERVAL HISTORY Jamie Matthews a 80y.o. female who has above history reviewed by me today presents fornon-small cell lung cancer and breast cancer management. Patient is accompanied by facility staff.  Per staff, patient does not drink much water during the day. She appears to be more deconditioned, not able to stand up.  Per staff, she has been like this for the past few weeks. Patient is a poor historian. She denies any pain, shortness of breath, cough, chest pain.  No report of acute bleeding events. Weight was not able to be obtained as patient is not able to stand up on a scale.  [facility]  Review of Systems  Constitutional:  Negative for appetite change, chills, fatigue and fever.  HENT:   Negative for hearing loss and voice change.   Eyes:  Negative for eye problems.  Respiratory:  Negative for chest tightness, cough and wheezing.   Cardiovascular:  Positive for leg swelling. Negative for chest pain.  Gastrointestinal:  Negative for abdominal distention, abdominal pain, blood in stool, constipation and nausea.  Endocrine: Negative for hot flashes.  Genitourinary:  Negative for difficulty urinating and frequency.   Musculoskeletal:  Negative for arthralgias.  Skin:  Negative for itching and rash.  Neurological:  Negative for extremity weakness and light-headedness.  Hematological:  Negative for adenopathy.  Psychiatric/Behavioral:  Negative for confusion and sleep disturbance.    MEDICAL HISTORY:  Past Medical History:  Diagnosis Date   [place]   Anxiety    Arthritis    Belching    Bladder disorder    OVERACTIVE   Bowel dysfunction    BLOCKAGE   Cancer (HCC)    breast   Constipation    Depression    Diverticulitis     Fibromyalgia    GERD (gastroesophageal reflux disease)    Hyperlipidemia    IBS (irritable bowel syndrome)    Internal hemorrhoids    [place]   Memory deficits    Murmur    asymptomatic   Pneumonia 11/18/12   Urinary incontinence    Vertigo     SURGICAL HISTORY: Past Surgical History:  Procedure Laterality Date   AXILLARY LYMPH NODE BIOPSY Left 06/08/2018   INVASIVE MAMMARY CARCINOMA   BLADDER SUSPENSION  2004, 2012   BREAST BIOPSY Left 06/08/2018   INVASIVE MAMMARY CARCINOMA   CATARACT EXTRACTION W/PHACO Right 08/27/2015   Procedure: CATARACT EXTRACTION PHACO AND INTRAOCULAR LENS PLACEMENT ([place];  Surgeon: SEstill Cotta MD;  Location: [facility] ORS;  Service: Ophthalmology;  Laterality: Right;  [place]  1:00.2 AP%  22.5 CDE  23.67 fluid casette lot [id number]  exp05/31/2018   CATARACT EXTRACTION W/PHACO Left 10/15/2015   Procedure: CATARACT EXTRACTION PHACO AND INTRAOCULAR LENS PLACEMENT (IOC);  Surgeon: SEstill Cotta MD;  Location: [facility] ORS;  Service: Ophthalmology;  Laterality: Left;  [place]:07 AP% 18.1 CDE 21.57 fluid pack lot # [id number]   CHOLECYSTECTOMY     COLONOSCOPY  2017   ELECTROMAGNETIC NAVIGATION BROCHOSCOPY N/A 07/09/2018   Procedure: ELECTROMAGNETIC NAVIGATION BRONCHOSCOPY;  Surgeon: KFlora Lipps MD;  Location: [facility] ORS;  Service: Cardiopulmonary;  Laterality: N/A;   TONSILLECTOMY  1947    SOCIAL HISTORY: Social History   Socioeconomic History   Marital status: Married  Spouse name: Bennye Alm   Number of children: 0   Years of education: Master's Degree   Highest education level: Not on file  Occupational History   Occupation: Retired  Tobacco Use   Smoking status: Never   Smokeless tobacco: Never  Vaping Use   Vaping Use: Never used  Substance and Sexual Activity   Alcohol use: Not Currently    Comment: rare wine   Drug use: No   Sexual activity: Not Currently  Other Topics Concern   Not on file  Social History Narrative    Recently moved with her husband to [place] from [place].   Husband is a retired Pharmacist, community.   No children.   She is a retired Pharmacist, hospital.   Enjoys: Chief Technology Officer, reading - mysteries and biographies, cooking   Exercise: walking, gardening   Diet: low appetite due to cancer treatment, grazing   She is a DNR.   Social Determinants of Health   Financial Resource Strain: Not on file  Food Insecurity: Not on file  Transportation Needs: Not on file  Physical Activity: Not on file  Stress: Not on file  Social Connections: Not on file  Intimate Partner Violence: Not on file    FAMILY HISTORY: Family History  Problem Relation Age of Onset   Diabetes Father    Heart disease Father    Lymphoma Father    Heart disease Mother    Breast cancer Sister 64   Colon cancer Neg Hx    Esophageal cancer Neg Hx    Rectal cancer Neg Hx    Stomach cancer Neg Hx    Bladder Cancer Neg Hx    Kidney cancer Neg Hx     ALLERGIES:  is allergic to sulfa antibiotics.  MEDICATIONS:  Current Outpatient Medications  Medication Sig Dispense Refill   acetaminophen (TYLENOL) 325 MG tablet Take 325 mg by mouth every 6 (six) hours as needed for mild pain.      albuterol (VENTOLIN HFA) 108 (90 Base) MCG/ACT inhaler Inhale 2 puffs into the lungs every 6 (six) hours as needed for wheezing or shortness of breath. 8 g 2   alum & mag hydroxide-simeth (MAALOX/MYLANTA) 200-200-20 MG/5ML suspension Take 30 mLs by mouth every 6 (six) hours as needed for indigestion or heartburn. 355 mL 0   anastrozole (ARIMIDEX) 1 MG tablet Take 1 tablet (1 mg total) by mouth daily. 90 tablet 1   apixaban (ELIQUIS) 5 MG TABS tablet Take 2 tablets (10 mg total) by mouth 2 (two) times daily for 4 days, THEN 1 tablet (5 mg total) 2 (two) times daily. 188 tablet 0   benzonatate (TESSALON) 100 MG capsule TAKE 1 CAPSULE BY MOUTH 3 TIMES DAILY ASNEEDED FOR COUGH. 30 capsule 0   Calcium-Magnesium-Vitamin D (CALCIUM 1200+D3 PO) Take 1 tablet by  mouth daily.     crizotinib (XALKORI) 250 MG capsule Take 1 capsule (250 mg total) by mouth 2 (two) times daily. 60 capsule 1   Dextromethorphan-guaiFENesin (MUCINEX DM) 30-600 MG TB12 Take 1 tablet by mouth 2 (two) times daily as needed (for congestion/cough).     fluticasone (FLONASE) 50 MCG/ACT nasal spray Place 2 sprays into both nostrils daily. 16 g 6   furosemide (LASIX) 40 MG tablet Take 2 tablets (80 mg total) by mouth daily. 30 tablet    levothyroxine (SYNTHROID) 25 MCG tablet TAKE 1 TABLET BY MOUTH DAILY 90 tablet 3   lubiprostone (AMITIZA) 24 MCG capsule TAKE ONE CAPSULE TWICE A DAY WITH MEALS 60  capsule 3   memantine (NAMENDA) 10 MG tablet Take 1 tablet (10 mg total) by mouth 2 (two) times daily. 180 tablet 2   Multiple Vitamin (MULTIVITAMIN WITH MINERALS) TABS tablet Take 1 tablet by mouth daily.     nystatin (MYCOSTATIN) 100000 UNIT/ML suspension Take 5 mLs (500,000 Units total) by mouth 4 (four) times daily. Swish and spit 473 mL 0   ondansetron (ZOFRAN) 8 MG tablet TAKE ONE TABLET EVERY EIGHT HOURS AS NEEDED FOR NAUSEA / VOMITING 60 tablet 1   polyethylene glycol (MIRALAX / GLYCOLAX) 17 g packet Take 17 g by mouth 2 (two) times daily. 14 each 0   potassium chloride SA (KLOR-CON) 20 MEQ tablet Take 20 mEq by mouth 2 (two) times daily.     senna-docusate (SENOKOT-S) 8.6-50 MG tablet Take 1 tablet by mouth 2 (two) times daily between meals as needed for mild constipation. 60 tablet 0   venlafaxine XR (EFFEXOR-XR) 150 MG 24 hr capsule TAKE 1 CAPSULE BY MOUTH EVERY DAY 90 capsule 1   Vitamin D, Ergocalciferol, (DRISDOL) 1.25 MG (50000 UNIT) CAPS capsule Take 50,000 Units by mouth every Sunday at 6pm.     No current facility-administered medications for this visit.     PHYSICAL EXAMINATION: ECOG PERFORMANCE STATUS: 2 - Symptomatic, <50% confined to bed Vitals:   03/21/21 1417  BP: 91/68  Pulse: 73  Temp: 97.8 F (36.6 C)  SpO2: 96%   There were no vitals filed for this  visit.   Physical Exam Constitutional:      General: She is not in acute distress.    Appearance: She is not diaphoretic.     Comments: Frail female, sits in the wheelchair.  HENT:     Head: Normocephalic and atraumatic.     Nose: Nose normal.     Mouth/Throat:     Pharynx: No oropharyngeal exudate.     Comments: Thrush Eyes:     General: No scleral icterus.    Pupils: Pupils are equal, round, and reactive to light.  Cardiovascular:     Rate and Rhythm: Normal rate and regular rhythm.     Heart sounds: No murmur heard. Pulmonary:     Effort: Pulmonary effort is normal. No respiratory distress.     Breath sounds: No wheezing or rales.  Chest:     Chest wall: No tenderness.  Abdominal:     General: There is no distension.     Palpations: Abdomen is soft.     Tenderness: There is no abdominal tenderness.  Musculoskeletal:        General: Normal range of motion.     Cervical back: Normal range of motion and neck supple.     Comments: Bilateral lower extremity 1+ edema  Skin:    General: Skin is warm and dry.     Findings: No erythema.  Neurological:     Mental Status: She is alert.     Cranial Nerves: No cranial nerve deficit.     Motor: No abnormal muscle tone.     Comments: Orientated x2-3  Psychiatric:        Mood and Affect: Affect normal.        LABORATORY DATA:  I have reviewed the data as listed Lab Results  Component Value Date   WBC 12.9 (H) 03/21/2021   HGB 9.8 (L) 03/21/2021   HCT 30.1 (L) 03/21/2021   MCV 93.5 03/21/2021   PLT 769 (H) 03/21/2021   Recent Labs    02/07/21 1449  02/08/21 0332 02/09/21 0519 02/11/21 0541 02/20/21 1427 03/21/21 1356  NA 134* 134*   < > 139 133* 126*  K 3.6 3.7   < > 3.8 4.3 4.5  CL 96* 98   < > 107 96* 92*  CO2 30 26   < > _0 GLUCOSE 182* 215*   < > 117* 152* 192*  BUN 22 20   < > 17 25* 24*  CREATININE 1.30* 1.02*   < > 0.88 1.37* 1.31*  CALCIUM 6.2* 6.4*   < > 7.4* 7.1* 7.1*  GFRNONAA 41* 55*   <  > >60 39* 41*  PROT 5.2* 4.8*  --   --  6.6 6.7  ALBUMIN 2.0* 1.9*   < > 2.5* 2.7* 2.6*  AST 42* 36  --   --  39 34  ALT 37 34  --   --  39 38  ALKPHOS 95 90  --   --  149* 190*  BILITOT 0.6 0.8  --   --  0.5 0.6  BILIDIR <0.1  --   --   --   --   --   IBILI NOT CALCULATED  --   --   --   --   --    < > = values in this interval not displayed.    Iron/TIBC/Ferritin/ %Sat    Component Value Date/Time   IRON 110 02/09/2021 0519   TIBC 181 (L) 02/09/2021 0519   FERRITIN 697 (H) 02/09/2021 0519   IRONPCTSAT 61 (H) 02/09/2021 0519     RADIOGRAPHIC STUDIES: I have personally reviewed the radiological images as listed and agreed with the findings in the report. DG Chest 2 View  Result Date: 02/07/2021 CLINICAL DATA:  Shortness of breath EXAM: CHEST - 2 VIEW COMPARISON:  Radiograph 01/08/2021, chest CT 01/08/2021 FINDINGS: Unchanged cardiomediastinal silhouette. There is no new focal airspace disease. Known faintly calcified nodule in the left apex is poorly visualized radiographically. Unchanged calcified more peripheral left apical calcified granuloma. There is no large pleural effusion or visible pneumothorax. Elevated right hemidiaphragm with colonic interposition. No acute osseous abnormality. Thoracic spondylosis. IMPRESSION: No new focal airspace disease. Electronically Signed   By: Maurine Simmering M.D.   On: 02/07/2021 16:01   DG Chest 2 View  Result Date: 12/28/2020 CLINICAL DATA:  Cough, wheezing and shortness of breath 1 week. EXAM: CHEST - 2 VIEW COMPARISON:  Chest CT 09/21/2020 FINDINGS: The cardiac silhouette, mediastinal and hilar contours are within normal limits and stable. Stable left upper lobe pulmonary lesion. Stable left upper lobe calcified granulomas. Stable mild eventration of the right hemidiaphragm. Mild streaky bibasilar atelectasis but no definite infiltrates or edema. Suspect small bilateral pleural effusions on the lateral film. Remote healed rib fractures are noted.  IMPRESSION: 1. Stable left upper lobe pulmonary lesion. 2. Suspect small bilateral pleural effusions and bibasilar atelectasis. Electronically Signed   By: Marijo Sanes M.D.   On: 12/28/2020 15:35   CT HEAD WO CONTRAST (5MM)  Result Date: 02/07/2021 CLINICAL DATA:  Mental status change EXAM: CT HEAD WITHOUT CONTRAST TECHNIQUE: Contiguous axial images were obtained from the base of the skull through the vertex without intravenous contrast. COMPARISON:  MRI 10/24/2020, CT brain 10/31/2018 FINDINGS: Brain: No acute territorial infarction, hemorrhage or intracranial mass. Atrophy and chronic small vessel ischemic changes of the white matter. Stable ventricle size Vascular: No hyperdense vessels.  Carotid vascular calcification Skull: Normal. Negative for fracture or focal lesion. Sinuses/Orbits: No acute finding.  Other: None IMPRESSION: 1. No CT evidence for acute intracranial abnormality. 2. Atrophy and chronic small vessel ischemic changes of the white matter Electronically Signed   By: Donavan Foil M.D.   On: 02/07/2021 17:29   CT Angio Chest PE W and/or Wo Contrast  Result Date: 02/07/2021 CLINICAL DATA:  wheezes throughout, whenever pt taken off any kind of O2, desaturates to 83%. Pt tachypnic. Abdominal distension EXAM: CT ANGIOGRAPHY CHEST CT ABDOMEN AND PELVIS WITH CONTRAST TECHNIQUE: Multidetector CT imaging of the chest was performed using the standard protocol during bolus administration of intravenous contrast. Multiplanar CT image reconstructions and MIPs were obtained to evaluate the vascular anatomy. Multidetector CT imaging of the abdomen and pelvis was performed using the standard protocol during bolus administration of intravenous contrast. CONTRAST:  65m OMNIPAQUE IOHEXOL 350 MG/ML SOLN COMPARISON:  CT chest 03/23/2019 FINDINGS: CTA CHEST FINDINGS Cardiovascular: Satisfactory opacification of the pulmonary arteries to the segmental level. Pulmonary artery filling defects within the left  lower lobe segmental and subsegmental pulmonary arteries, right upper lobe segmental and subsegmental pulmonary arteries, right lower lobe subsegmental pulmonary arteries, right middle lobe segmental and subsegmental pulmonary arteries. Some of these appear central and cords like but are not visualized on the prior CT angiography chest 01/08/2021. Normal heart size. No pericardial effusion. Atherosclerotic plaque of the thoracic aorta. Mediastinum/Nodes: No enlarged mediastinal, hilar, or axillary lymph nodes. Thyroid gland and trachea demonstrate no significant findings. At least small to moderate hiatal hernia. Poor visualization of the esophagus due to timing of contrast. Lungs/Pleura: Hazy airspace opacity peripherally within the right upper lobe (6:45). Likely subsegmental atelectasis at the left lower lobe. Similar-appearing 2.2 x 2.3 cm partially calcified pulmonary nodule (interval decrease in size from PET-CT 06/21/2018). Calcified pulmonary nodule at the left apex (6:28). Left peribronchovascular calcifications likely representing old granulomatous disease (6:40, 57). Associated traction bronchiectasis and architectural distortion. No pulmonary mass. No pleural effusion. No pneumothorax. Musculoskeletal: No chest wall abnormality. No suspicious lytic or blastic osseous lesions. Old healed right rib fractures. No acute displaced fracture. Similar-appearing compression fractures of the T9 and T11 vertebral bodies. Multilevel degenerative changes of the spine. Degenerative changes of the visualized right hand. Review of the MIP images confirms the above findings. CT ABDOMEN and PELVIS FINDINGS Hepatobiliary: No focal liver abnormality. Status post cholecystectomy. No biliary dilatation. Pancreas: No focal lesion. Normal pancreatic contour. No surrounding inflammatory changes. No main pancreatic ductal dilatation. Spleen: Normal in size without focal abnormality. Adrenals/Urinary Tract: No adrenal nodule  bilaterally. Bilateral kidneys enhance symmetrically. No hydronephrosis. No hydroureter. The urinary bladder is distended with urine. Stomach/Bowel: Stomach is within normal limits. No evidence of bowel wall thickening or dilatation. Stool throughout the colon. The appendix not definitely identified. Vascular/Lymphatic: No abdominal aorta or iliac aneurysm. Moderate to severe calcified and noncalcified atherosclerotic plaque of the aorta and its branches. No abdominal, pelvic, or inguinal lymphadenopathy. Reproductive: Uterus and bilateral adnexa are unremarkable. Other: No intraperitoneal free fluid. No intraperitoneal free gas. No organized fluid collection. Musculoskeletal: Tiny fat containing umbilical hernia. No suspicious lytic or blastic osseous lesions. No acute displaced fracture. Multilevel degenerative changes of the spine. Review of the MIP images confirms the above findings. IMPRESSION: 1. All three right lobe and left lower lobe segmental and subsegmental pulmonary emboli. No findings of right heart strain. Possible developing pulmonary infarction in the right upper lobe. 2. At least small to moderate hiatal hernia. Poor visualization of the esophagus due to timing of contrast. 3. Similar-appearing 2.2 x 2.3 cm  left apex lobe partially calcified pulmonary nodule (interval decrease in size from PET-CT 06/21/2018). 4. Constipation and urinary bladder distended with urine with otherwise no acute intra-abdominal or intrapelvic abnormality. 5. Other imaging findings of potential clinical significance: Sequela of prior granulomatous disease of the left lung. Chronic T9 and T11 compression fractures. Aortic Atherosclerosis (ICD10-I70.0). These results were called by telephone at the time of interpretation on 02/07/2021 at 5:43 pm to provider [facility] , who verbally acknowledged these results. Electronically Signed   By: Iven Finn M.D.   On: 02/07/2021 18:01   CT Angio Chest PE W and/or Wo  Contrast  Result Date: 01/08/2021 CLINICAL DATA:  Cough, congestion, shortness of breath. History of lung cancer and breast cancer. EXAM: CT ANGIOGRAPHY CHEST WITH CONTRAST TECHNIQUE: Multidetector CT imaging of the chest was performed using the standard protocol during bolus administration of intravenous contrast. Multiplanar CT image reconstructions and MIPs were obtained to evaluate the vascular anatomy. CONTRAST:  5m OMNIPAQUE IOHEXOL 350 MG/ML SOLN COMPARISON:  CT chest dated September 21, 2020. FINDINGS: Cardiovascular: Satisfactory opacification of the pulmonary arteries to the segmental level. No evidence of pulmonary embolism. Normal heart size. No pericardial effusion. No thoracic aortic aneurysm or dissection. Mild atherosclerotic calcification of the aortic arch. Mediastinum/Nodes: No enlarged mediastinal, hilar, or axillary lymph nodes. Thyroid gland, and trachea demonstrate no significant findings. New moderate hiatal hernia and diffusely distended esophagus with air-fluid level. Lungs/Pleura: New small left greater than right pleural effusions. No consolidation or pneumothorax. Mild subsegmental atelectasis in the left lower lobe. Unchanged faintly calcified 2.3 x 2.0 cm nodule in the apical left upper lobe (series 7, image 20). Unchanged 5 mm nodule in the right middle lobe (series 7, image 39). Upper Abdomen: No acute abnormality. Large amount of stool in the visualized colon. Musculoskeletal: No chest wall abnormality. No acute or significant osseous findings. Chronic T9 and T11 compression deformities again noted. Multiple old right-sided rib fractures again noted. Review of the MIP images confirms the above findings. IMPRESSION: 1. No evidence of pulmonary embolism. 2. New small bilateral pleural effusions. 3. New moderate hiatal hernia and diffusely distended esophagus with air-fluid level, suggestive of gastroesophageal reflux. 4. Unchanged 2.3 cm faintly calcified solid pulmonary nodule in the  apical left upper lobe. 5. Aortic Atherosclerosis (ICD10-I70.0). Electronically Signed   By: WTitus DubinM.D.   On: 01/08/2021 12:50   CT ABDOMEN PELVIS W CONTRAST  Result Date: 02/07/2021 CLINICAL DATA:  wheezes throughout, whenever pt taken off any kind of O2, desaturates to 83%. Pt tachypnic. Abdominal distension EXAM: CT ANGIOGRAPHY CHEST CT ABDOMEN AND PELVIS WITH CONTRAST TECHNIQUE: Multidetector CT imaging of the chest was performed using the standard protocol during bolus administration of intravenous contrast. Multiplanar CT image reconstructions and MIPs were obtained to evaluate the vascular anatomy. Multidetector CT imaging of the abdomen and pelvis was performed using the standard protocol during bolus administration of intravenous contrast. CONTRAST:  73mOMNIPAQUE IOHEXOL 350 MG/ML SOLN COMPARISON:  CT chest 03/23/2019 FINDINGS: CTA CHEST FINDINGS Cardiovascular: Satisfactory opacification of the pulmonary arteries to the segmental level. Pulmonary artery filling defects within the left lower lobe segmental and subsegmental pulmonary arteries, right upper lobe segmental and subsegmental pulmonary arteries, right lower lobe subsegmental pulmonary arteries, right middle lobe segmental and subsegmental pulmonary arteries. Some of these appear central and cords like but are not visualized on the prior CT angiography chest 01/08/2021. Normal heart size. No pericardial effusion. Atherosclerotic plaque of the thoracic aorta. Mediastinum/Nodes: No enlarged mediastinal, hilar,  or axillary lymph nodes. Thyroid gland and trachea demonstrate no significant findings. At least small to moderate hiatal hernia. Poor visualization of the esophagus due to timing of contrast. Lungs/Pleura: Hazy airspace opacity peripherally within the right upper lobe (6:45). Likely subsegmental atelectasis at the left lower lobe. Similar-appearing 2.2 x 2.3 cm partially calcified pulmonary nodule (interval decrease in size  from PET-CT 06/21/2018). Calcified pulmonary nodule at the left apex (6:28). Left peribronchovascular calcifications likely representing old granulomatous disease (6:40, 57). Associated traction bronchiectasis and architectural distortion. No pulmonary mass. No pleural effusion. No pneumothorax. Musculoskeletal: No chest wall abnormality. No suspicious lytic or blastic osseous lesions. Old healed right rib fractures. No acute displaced fracture. Similar-appearing compression fractures of the T9 and T11 vertebral bodies. Multilevel degenerative changes of the spine. Degenerative changes of the visualized right hand. Review of the MIP images confirms the above findings. CT ABDOMEN and PELVIS FINDINGS Hepatobiliary: No focal liver abnormality. Status post cholecystectomy. No biliary dilatation. Pancreas: No focal lesion. Normal pancreatic contour. No surrounding inflammatory changes. No main pancreatic ductal dilatation. Spleen: Normal in size without focal abnormality. Adrenals/Urinary Tract: No adrenal nodule bilaterally. Bilateral kidneys enhance symmetrically. No hydronephrosis. No hydroureter. The urinary bladder is distended with urine. Stomach/Bowel: Stomach is within normal limits. No evidence of bowel wall thickening or dilatation. Stool throughout the colon. The appendix not definitely identified. Vascular/Lymphatic: No abdominal aorta or iliac aneurysm. Moderate to severe calcified and noncalcified atherosclerotic plaque of the aorta and its branches. No abdominal, pelvic, or inguinal lymphadenopathy. Reproductive: Uterus and bilateral adnexa are unremarkable. Other: No intraperitoneal free fluid. No intraperitoneal free gas. No organized fluid collection. Musculoskeletal: Tiny fat containing umbilical hernia. No suspicious lytic or blastic osseous lesions. No acute displaced fracture. Multilevel degenerative changes of the spine. Review of the MIP images confirms the above findings. IMPRESSION: 1. All three  right lobe and left lower lobe segmental and subsegmental pulmonary emboli. No findings of right heart strain. Possible developing pulmonary infarction in the right upper lobe. 2. At least small to moderate hiatal hernia. Poor visualization of the esophagus due to timing of contrast. 3. Similar-appearing 2.2 x 2.3 cm left apex lobe partially calcified pulmonary nodule (interval decrease in size from PET-CT 06/21/2018). 4. Constipation and urinary bladder distended with urine with otherwise no acute intra-abdominal or intrapelvic abnormality. 5. Other imaging findings of potential clinical significance: Sequela of prior granulomatous disease of the left lung. Chronic T9 and T11 compression fractures. Aortic Atherosclerosis (ICD10-I70.0). These results were called by telephone at the time of interpretation on 02/07/2021 at 5:43 pm to provider [facility] , who verbally acknowledged these results. Electronically Signed   By: Iven Finn M.D.   On: 02/07/2021 18:01   US RENAL  Result Date: 02/21/2021 CLINICAL DATA:  Acute kidney injury EXAM: RENAL / URINARY TRACT ULTRASOUND COMPLETE COMPARISON:  CT 02/07/2021 FINDINGS: Right Kidney: Renal measurements: 9.9 x 4.4 x 4.5 cm = volume: 102 mL. Echogenicity within normal limits. No mass or hydronephrosis visualized. Left Kidney: Renal measurements: 10.2 x 6.3 x 4.4 cm = volume: 149 mL. Echogenicity within normal limits. No mass or hydronephrosis visualized. Bladder: No wall thickening. Low-level internal echoes/debris floating in the urinary bladder. Other: None. IMPRESSION: No acute findings.  No hydronephrosis. Floating debris within the bladder.  Correlate with urinalysis. Electronically Signed   By: Rolm Baptise M.D.   On: 02/21/2021 15:16   US Venous Img Lower Bilateral (DVT)  Result Date: 02/08/2021 CLINICAL DATA:  Bilateral lower extremity edema.  Evaluate  for DVT. EXAM: BILATERAL LOWER EXTREMITY VENOUS DOPPLER ULTRASOUND TECHNIQUE: Gray-scale sonography with  graded compression, as well as color Doppler and duplex ultrasound were performed to evaluate the lower extremity deep venous systems from the level of the common femoral vein and including the common femoral, femoral, profunda femoral, popliteal and calf veins including the posterior tibial, peroneal and gastrocnemius veins when visible. The superficial great saphenous vein was also interrogated. Spectral Doppler was utilized to evaluate flow at rest and with distal augmentation maneuvers in the common femoral, femoral and popliteal veins. COMPARISON:  Right lower extremity venous Doppler ultrasound-09/20/2018 (negative) Left lower extremity venous Doppler ultrasound-05/28/2015 (negative) FINDINGS: RIGHT LOWER EXTREMITY Common Femoral Vein: No evidence of thrombus. Normal compressibility, respiratory phasicity and response to augmentation. Saphenofemoral Junction: No evidence of thrombus. Normal compressibility and flow on color Doppler imaging. Profunda Femoral Vein: No evidence of thrombus. Normal compressibility and flow on color Doppler imaging. Femoral Vein: No evidence of thrombus. Normal compressibility, respiratory phasicity and response to augmentation. Popliteal Vein: No evidence of thrombus. Normal compressibility, respiratory phasicity and response to augmentation. Calf Veins: No evidence of thrombus. Normal compressibility and flow on color Doppler imaging. Superficial Great Saphenous Vein: No evidence of thrombus. Normal compressibility. Other Findings:  None. LEFT LOWER EXTREMITY Common Femoral Vein: No evidence of thrombus. Normal compressibility, respiratory phasicity and response to augmentation. Saphenofemoral Junction: No evidence of thrombus. Normal compressibility and flow on color Doppler imaging. Profunda Femoral Vein: No evidence of thrombus. Normal compressibility and flow on color Doppler imaging. Femoral Vein: While the proximal and mid aspects of the left femoral vein appear widely  patent, there is hypoechoic nonocclusive thrombus involving the distal aspect the left femoral vein (image 41) Popliteal Vein: There is hypoechoic near occlusive thrombus involving the left popliteal vein (images 44 and 45 Calf Veins: There is hypoechoic occlusive thrombus involving both paired left posterior tibial veins (image 50). The left peroneal vein appears patent where imaged. Superficial Great Saphenous Vein: No evidence of thrombus. Normal compressibility. Other Findings:  None. IMPRESSION: 1. The examination is positive for mixed occlusive and nonocclusive DVT extending from the distal aspect the left femoral vein through the level of the left posterior tibial veins. 2. No evidence of DVT within the right lower extremity. Electronically Signed   By: Sandi Mariscal M.D.   On: 02/08/2021 15:10   DG Chest Portable 1 View  Result Date: 01/08/2021 CLINICAL DATA:  Shortness of breath and cough EXAM: PORTABLE CHEST 1 VIEW COMPARISON:  Eleven days ago FINDINGS: Low volume chest. Lucency under the right diaphragm has the appearance of colon. There is no edema, consolidation, effusion, or pneumothorax. Retrocardiac density is from tortuous aorta. Chronic nodular density at the left apex. IMPRESSION: Stable low volume chest.  No focal pneumonia. Electronically Signed   By: Monte Fantasia M.D.   On: 01/08/2021 11:25   ECHOCARDIOGRAM COMPLETE  Result Date: 02/08/2021    ECHOCARDIOGRAM REPORT   Patient Name:   ARRIN ISHLER Date of Exam: 02/08/2021 Medical Rec #:  [id number]                     Height:       62.0 in Accession #:    [id number]                    Weight:       137.0 lb Date of Birth:  [birth year]  BSA:          1.628 m Patient Age:    82 years                      BP:           114/45 mmHg Patient Gender: F                             HR:           62 bpm. Exam Location:  [facility] Procedure: 2D Echo, Color Doppler and Cardiac Doppler Indications:     Elevated troponin   History:         Patient has prior history of Echocardiogram examinations, most                  recent 10/10/2018. Signs/Symptoms:Murmur; Risk                  Factors:Dyslipidemia.  Sonographer:     Charmayne Sheer Referring Phys:  [id number] Rhetta Mura Diagnosing Phys: Ida Rogue MD IMPRESSIONS  1. Left ventricular ejection fraction, by estimation, is 60 to 65%. The left ventricle has normal function. The left ventricle has no regional wall motion abnormalities. Left ventricular diastolic parameters are consistent with Grade II diastolic dysfunction (pseudonormalization).  2. Right ventricular systolic function is normal. The right ventricular size is normal. There is moderately elevated pulmonary artery systolic pressure. The estimated right ventricular systolic pressure is 06.3 mmHg.  3. The mitral valve is normal in structure. No evidence of mitral valve regurgitation. No evidence of mitral stenosis.  4. Tricuspid valve regurgitation is moderate. FINDINGS  Left Ventricle: Left ventricular ejection fraction, by estimation, is 60 to 65%. The left ventricle has normal function. The left ventricle has no regional wall motion abnormalities. The left ventricular internal cavity size was normal in size. There is  no left ventricular hypertrophy. Left ventricular diastolic parameters are consistent with Grade II diastolic dysfunction (pseudonormalization). Right Ventricle: The right ventricular size is normal. No increase in right ventricular wall thickness. Right ventricular systolic function is normal. There is moderately elevated pulmonary artery systolic pressure. The tricuspid regurgitant velocity is 3.56 m/s, and with an assumed right atrial pressure of 5 mmHg, the estimated right ventricular systolic pressure is 01.6 mmHg. Left Atrium: Left atrial size was normal in size. Right Atrium: Right atrial size was normal in size. Pericardium: There is no evidence of pericardial effusion. Mitral Valve: The mitral  valve is normal in structure. No evidence of mitral valve regurgitation. No evidence of mitral valve stenosis. MV peak gradient, 6.1 mmHg. The mean mitral valve gradient is 2.0 mmHg. Tricuspid Valve: The tricuspid valve is normal in structure. Tricuspid valve regurgitation is moderate . No evidence of tricuspid stenosis. Aortic Valve: The aortic valve is normal in structure. Aortic valve regurgitation is not visualized. Mild to moderate aortic valve sclerosis/calcification is present, without any evidence of aortic stenosis. Aortic valve mean gradient measures 10.2 mmHg.  Aortic valve peak gradient measures 18.7 mmHg. Aortic valve area, by VTI measures 2.13 cm. Pulmonic Valve: The pulmonic valve was normal in structure. Pulmonic valve regurgitation is not visualized. No evidence of pulmonic stenosis. Aorta: The aortic root is normal in size and structure. Venous: The inferior vena cava is normal in size with greater than 50% respiratory variability, suggesting right atrial pressure of 3 mmHg. IAS/Shunts: No atrial level shunt detected by color flow Doppler.  LEFT VENTRICLE PLAX 2D LVIDd:         4.00 cm  Diastology LVIDs:         2.50 cm  LV e' medial:    7.94 cm/s LV PW:         1.00 cm  LV E/e' medial:  12.7 LV IVS:        0.70 cm  LV e' lateral:   8.92 cm/s LVOT diam:     2.10 cm  LV E/e' lateral: 11.3 LV SV:         99 LV SV Index:   61 LVOT Area:     3.46 cm  LEFT ATRIUM           Index       RIGHT ATRIUM           Index LA diam:      3.40 cm 2.09 cm/m  RA Area:     13.30 cm LA Vol (A4C): 43.1 ml 26.48 ml/m RA Volume:   35.60 ml  21.87 ml/m  AORTIC VALVE                    PULMONIC VALVE AV Area (Vmax):    2.32 cm     PV Vmax:       1.25 m/s AV Area (Vmean):   2.11 cm     PV Vmean:      88.700 cm/s AV Area (VTI):     2.13 cm     PV VTI:        0.272 m AV Vmax:           216.25 cm/s  PV Peak grad:  6.2 mmHg AV Vmean:          152.750 cm/s PV Mean grad:  3.0 mmHg AV VTI:            0.465 m AV Peak Grad:       18.7 mmHg AV Mean Grad:      10.2 mmHg LVOT Vmax:         145.00 cm/s LVOT Vmean:        93.000 cm/s LVOT VTI:          0.286 m LVOT/AV VTI ratio: 0.61  AORTA Ao Root diam: 3.10 cm MITRAL VALVE                TRICUSPID VALVE MV Area (PHT): 2.73 cm     TR Peak grad:   50.7 mmHg MV Area VTI:   2.24 cm     TR Vmax:        356.00 cm/s MV Peak grad:  6.1 mmHg MV Mean grad:  2.0 mmHg     SHUNTS MV Vmax:       1.23 m/s     Systemic VTI:  0.29 m MV Vmean:      69.8 cm/s    Systemic Diam: 2.10 cm MV Decel Time: 278 msec MV E velocity: 101.00 cm/s MV A velocity: 96.00 cm/s MV E/A ratio:  1.05 Ida Rogue MD Electronically signed by Ida Rogue MD Signature Date/Time: 02/08/2021/2:20:10 PM    Final        ASSESSMENT & PLAN:  1. Elevated serum creatinine   2. Primary adenocarcinoma of left lung ([place])   3. Malignant neoplasm of left female breast, unspecified estrogen receptor status, unspecified site of breast ([place])   4. Encounter for antineoplastic chemotherapy   5. Aromatase inhibitor use   6. Hyponatremia  7. Thrush   8. Memory loss   Cancer Staging Malignant neoplasm of left female breast [facility]) Staging form: Breast, AJCC 8th Edition - Clinical stage from 12/17/2018: Stage IB (cT1b, cN1, cM0, G1, ER+, PR+, HER2-) - Signed by Earlie Server, MD on 12/17/2018  Primary adenocarcinoma of lung [facility] Staging form: Lung, AJCC 8th Edition - Clinical stage from 09/28/2018: Stage IVA (cT2, cN3, cM1a) - Signed by Earlie Server, MD on 09/28/2018   Patient has 2 primaries.   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 Recent CT 02/07/2021 CT chest angiogram and abdomen/pelvis shows stable lung mass. No disease progression.  Labs reviewed and discussed with patient. Continue Crizotinib 255m BID.   # Breast cancer  Continue Arimidex.  #Thrush, recommend patient to nystatin swish and spit #hypocalcemia, Calcium level 8.8.    Continue calcium supplementation.   # acute PE and DVT, continue  Eliquis 542mBID # elevated creatinine, slightly better than last visit.  # Hyponatremia, recommend her to increase oral hydration. She is also on Lasix which may contribute to her elevated creatinine and hyponatremia.  # momory loss, follow up with PCP.   Code status DNR  Follow-up  6 weeks, repeat CBC, CMP, see MD.  All questions were answered. The patient knows to call the clinic with any problems questions or concerns.  ZhEarlie ServerMD, PhD 03/21/2021

## 2021-03-23 DIAGNOSIS — J9601 Acute respiratory failure with hypoxia: Secondary | ICD-10-CM | POA: Diagnosis not present

## 2021-03-23 DIAGNOSIS — R2689 Other abnormalities of gait and mobility: Secondary | ICD-10-CM | POA: Diagnosis not present

## 2021-03-23 DIAGNOSIS — J90 Pleural effusion, not elsewhere classified: Secondary | ICD-10-CM | POA: Diagnosis not present

## 2021-03-23 DIAGNOSIS — R278 Other lack of coordination: Secondary | ICD-10-CM | POA: Diagnosis not present

## 2021-03-23 DIAGNOSIS — K219 Gastro-esophageal reflux disease without esophagitis: Secondary | ICD-10-CM | POA: Diagnosis not present

## 2021-03-23 DIAGNOSIS — M6281 Muscle weakness (generalized): Secondary | ICD-10-CM | POA: Diagnosis not present

## 2021-03-25 DIAGNOSIS — J90 Pleural effusion, not elsewhere classified: Secondary | ICD-10-CM | POA: Diagnosis not present

## 2021-03-25 DIAGNOSIS — J9601 Acute respiratory failure with hypoxia: Secondary | ICD-10-CM | POA: Diagnosis not present

## 2021-03-25 DIAGNOSIS — R2689 Other abnormalities of gait and mobility: Secondary | ICD-10-CM | POA: Diagnosis not present

## 2021-03-25 DIAGNOSIS — M6281 Muscle weakness (generalized): Secondary | ICD-10-CM | POA: Diagnosis not present

## 2021-03-25 DIAGNOSIS — K219 Gastro-esophageal reflux disease without esophagitis: Secondary | ICD-10-CM | POA: Diagnosis not present

## 2021-03-25 DIAGNOSIS — R278 Other lack of coordination: Secondary | ICD-10-CM | POA: Diagnosis not present

## 2021-03-27 DIAGNOSIS — R2689 Other abnormalities of gait and mobility: Secondary | ICD-10-CM | POA: Diagnosis not present

## 2021-03-27 DIAGNOSIS — N3281 Overactive bladder: Secondary | ICD-10-CM | POA: Diagnosis not present

## 2021-03-27 DIAGNOSIS — J9601 Acute respiratory failure with hypoxia: Secondary | ICD-10-CM | POA: Diagnosis not present

## 2021-03-27 DIAGNOSIS — F419 Anxiety disorder, unspecified: Secondary | ICD-10-CM | POA: Diagnosis not present

## 2021-03-27 DIAGNOSIS — R4189 Other symptoms and signs involving cognitive functions and awareness: Secondary | ICD-10-CM | POA: Diagnosis not present

## 2021-03-27 DIAGNOSIS — F329 Major depressive disorder, single episode, unspecified: Secondary | ICD-10-CM | POA: Diagnosis not present

## 2021-03-27 DIAGNOSIS — R278 Other lack of coordination: Secondary | ICD-10-CM | POA: Diagnosis not present

## 2021-03-27 DIAGNOSIS — J90 Pleural effusion, not elsewhere classified: Secondary | ICD-10-CM | POA: Diagnosis not present

## 2021-03-27 DIAGNOSIS — R0781 Pleurodynia: Secondary | ICD-10-CM | POA: Diagnosis not present

## 2021-03-27 DIAGNOSIS — Z741 Need for assistance with personal care: Secondary | ICD-10-CM | POA: Diagnosis not present

## 2021-03-27 DIAGNOSIS — M6281 Muscle weakness (generalized): Secondary | ICD-10-CM | POA: Diagnosis not present

## 2021-03-27 DIAGNOSIS — F0390 Unspecified dementia without behavioral disturbance: Secondary | ICD-10-CM | POA: Diagnosis not present

## 2021-03-27 DIAGNOSIS — R296 Repeated falls: Secondary | ICD-10-CM | POA: Diagnosis not present

## 2021-03-27 DIAGNOSIS — K219 Gastro-esophageal reflux disease without esophagitis: Secondary | ICD-10-CM | POA: Diagnosis not present

## 2021-03-29 DIAGNOSIS — R532 Functional quadriplegia: Secondary | ICD-10-CM | POA: Diagnosis not present

## 2021-03-29 DIAGNOSIS — N183 Chronic kidney disease, stage 3 unspecified: Secondary | ICD-10-CM | POA: Diagnosis not present

## 2021-03-29 DIAGNOSIS — K219 Gastro-esophageal reflux disease without esophagitis: Secondary | ICD-10-CM | POA: Diagnosis not present

## 2021-03-29 DIAGNOSIS — Z853 Personal history of malignant neoplasm of breast: Secondary | ICD-10-CM | POA: Diagnosis not present

## 2021-03-29 DIAGNOSIS — J9610 Chronic respiratory failure, unspecified whether with hypoxia or hypercapnia: Secondary | ICD-10-CM | POA: Diagnosis not present

## 2021-03-29 DIAGNOSIS — E039 Hypothyroidism, unspecified: Secondary | ICD-10-CM | POA: Diagnosis not present

## 2021-03-29 DIAGNOSIS — I872 Venous insufficiency (chronic) (peripheral): Secondary | ICD-10-CM | POA: Diagnosis not present

## 2021-03-29 DIAGNOSIS — C3490 Malignant neoplasm of unspecified part of unspecified bronchus or lung: Secondary | ICD-10-CM | POA: Diagnosis not present

## 2021-03-29 DIAGNOSIS — F39 Unspecified mood [affective] disorder: Secondary | ICD-10-CM | POA: Diagnosis not present

## 2021-03-29 DIAGNOSIS — G3184 Mild cognitive impairment, so stated: Secondary | ICD-10-CM | POA: Diagnosis not present

## 2021-03-29 DIAGNOSIS — K5900 Constipation, unspecified: Secondary | ICD-10-CM | POA: Diagnosis not present

## 2021-04-03 DIAGNOSIS — J9601 Acute respiratory failure with hypoxia: Secondary | ICD-10-CM | POA: Diagnosis not present

## 2021-04-03 DIAGNOSIS — R2689 Other abnormalities of gait and mobility: Secondary | ICD-10-CM | POA: Diagnosis not present

## 2021-04-03 DIAGNOSIS — J90 Pleural effusion, not elsewhere classified: Secondary | ICD-10-CM | POA: Diagnosis not present

## 2021-04-03 DIAGNOSIS — K219 Gastro-esophageal reflux disease without esophagitis: Secondary | ICD-10-CM | POA: Diagnosis not present

## 2021-04-03 DIAGNOSIS — R278 Other lack of coordination: Secondary | ICD-10-CM | POA: Diagnosis not present

## 2021-04-03 DIAGNOSIS — M6281 Muscle weakness (generalized): Secondary | ICD-10-CM | POA: Diagnosis not present

## 2021-04-05 ENCOUNTER — Encounter: Payer: Self-pay | Admitting: Oncology

## 2021-04-05 DIAGNOSIS — R278 Other lack of coordination: Secondary | ICD-10-CM | POA: Diagnosis not present

## 2021-04-05 DIAGNOSIS — M6281 Muscle weakness (generalized): Secondary | ICD-10-CM | POA: Diagnosis not present

## 2021-04-05 DIAGNOSIS — J9601 Acute respiratory failure with hypoxia: Secondary | ICD-10-CM | POA: Diagnosis not present

## 2021-04-05 DIAGNOSIS — R2689 Other abnormalities of gait and mobility: Secondary | ICD-10-CM | POA: Diagnosis not present

## 2021-04-05 DIAGNOSIS — K219 Gastro-esophageal reflux disease without esophagitis: Secondary | ICD-10-CM | POA: Diagnosis not present

## 2021-04-05 DIAGNOSIS — J90 Pleural effusion, not elsewhere classified: Secondary | ICD-10-CM | POA: Diagnosis not present

## 2021-04-06 DIAGNOSIS — J9601 Acute respiratory failure with hypoxia: Secondary | ICD-10-CM | POA: Diagnosis not present

## 2021-04-06 DIAGNOSIS — J90 Pleural effusion, not elsewhere classified: Secondary | ICD-10-CM | POA: Diagnosis not present

## 2021-04-06 DIAGNOSIS — M6281 Muscle weakness (generalized): Secondary | ICD-10-CM | POA: Diagnosis not present

## 2021-04-06 DIAGNOSIS — R278 Other lack of coordination: Secondary | ICD-10-CM | POA: Diagnosis not present

## 2021-04-06 DIAGNOSIS — K219 Gastro-esophageal reflux disease without esophagitis: Secondary | ICD-10-CM | POA: Diagnosis not present

## 2021-04-06 DIAGNOSIS — R2689 Other abnormalities of gait and mobility: Secondary | ICD-10-CM | POA: Diagnosis not present

## 2021-04-10 ENCOUNTER — Other Ambulatory Visit (HOSPITAL_COMMUNITY): Payer: Self-pay

## 2021-04-10 DIAGNOSIS — J90 Pleural effusion, not elsewhere classified: Secondary | ICD-10-CM | POA: Diagnosis not present

## 2021-04-10 DIAGNOSIS — R2689 Other abnormalities of gait and mobility: Secondary | ICD-10-CM | POA: Diagnosis not present

## 2021-04-10 DIAGNOSIS — R278 Other lack of coordination: Secondary | ICD-10-CM | POA: Diagnosis not present

## 2021-04-10 DIAGNOSIS — J9601 Acute respiratory failure with hypoxia: Secondary | ICD-10-CM | POA: Diagnosis not present

## 2021-04-10 DIAGNOSIS — K219 Gastro-esophageal reflux disease without esophagitis: Secondary | ICD-10-CM | POA: Diagnosis not present

## 2021-04-10 DIAGNOSIS — M6281 Muscle weakness (generalized): Secondary | ICD-10-CM | POA: Diagnosis not present

## 2021-04-13 DIAGNOSIS — R0989 Other specified symptoms and signs involving the circulatory and respiratory systems: Secondary | ICD-10-CM | POA: Diagnosis not present

## 2021-04-15 DIAGNOSIS — J440 Chronic obstructive pulmonary disease with acute lower respiratory infection: Secondary | ICD-10-CM | POA: Diagnosis not present

## 2021-04-16 DIAGNOSIS — E440 Moderate protein-calorie malnutrition: Secondary | ICD-10-CM | POA: Diagnosis not present

## 2021-04-22 ENCOUNTER — Other Ambulatory Visit (HOSPITAL_COMMUNITY): Payer: Self-pay

## 2021-04-24 DIAGNOSIS — R911 Solitary pulmonary nodule: Secondary | ICD-10-CM | POA: Diagnosis not present

## 2021-04-24 DIAGNOSIS — J9691 Respiratory failure, unspecified with hypoxia: Secondary | ICD-10-CM | POA: Diagnosis not present

## 2021-04-24 DIAGNOSIS — E039 Hypothyroidism, unspecified: Secondary | ICD-10-CM | POA: Diagnosis not present

## 2021-04-24 DIAGNOSIS — N3281 Overactive bladder: Secondary | ICD-10-CM | POA: Diagnosis not present

## 2021-04-24 DIAGNOSIS — E8809 Other disorders of plasma-protein metabolism, not elsewhere classified: Secondary | ICD-10-CM | POA: Diagnosis not present

## 2021-04-24 DIAGNOSIS — I5089 Other heart failure: Secondary | ICD-10-CM | POA: Diagnosis not present

## 2021-04-24 DIAGNOSIS — M797 Fibromyalgia: Secondary | ICD-10-CM | POA: Diagnosis not present

## 2021-04-24 DIAGNOSIS — R630 Anorexia: Secondary | ICD-10-CM | POA: Diagnosis not present

## 2021-04-24 DIAGNOSIS — E785 Hyperlipidemia, unspecified: Secondary | ICD-10-CM | POA: Diagnosis not present

## 2021-04-24 DIAGNOSIS — C7989 Secondary malignant neoplasm of other specified sites: Secondary | ICD-10-CM | POA: Diagnosis not present

## 2021-04-24 DIAGNOSIS — F0393 Unspecified dementia, unspecified severity, with mood disturbance: Secondary | ICD-10-CM | POA: Diagnosis not present

## 2021-04-24 DIAGNOSIS — C50919 Malignant neoplasm of unspecified site of unspecified female breast: Secondary | ICD-10-CM | POA: Diagnosis not present

## 2021-04-24 DIAGNOSIS — E43 Unspecified severe protein-calorie malnutrition: Secondary | ICD-10-CM | POA: Diagnosis not present

## 2021-04-24 DIAGNOSIS — F0394 Unspecified dementia, unspecified severity, with anxiety: Secondary | ICD-10-CM | POA: Diagnosis not present

## 2021-04-24 DIAGNOSIS — Z9221 Personal history of antineoplastic chemotherapy: Secondary | ICD-10-CM | POA: Diagnosis not present

## 2021-04-24 DIAGNOSIS — T451X5D Adverse effect of antineoplastic and immunosuppressive drugs, subsequent encounter: Secondary | ICD-10-CM | POA: Diagnosis not present

## 2021-04-24 DIAGNOSIS — I2699 Other pulmonary embolism without acute cor pulmonale: Secondary | ICD-10-CM | POA: Diagnosis not present

## 2021-04-24 DIAGNOSIS — I10 Essential (primary) hypertension: Secondary | ICD-10-CM | POA: Diagnosis not present

## 2021-04-24 DIAGNOSIS — R634 Abnormal weight loss: Secondary | ICD-10-CM | POA: Diagnosis not present

## 2021-04-24 DIAGNOSIS — J309 Allergic rhinitis, unspecified: Secondary | ICD-10-CM | POA: Diagnosis not present

## 2021-04-24 DIAGNOSIS — K219 Gastro-esophageal reflux disease without esophagitis: Secondary | ICD-10-CM | POA: Diagnosis not present

## 2021-04-24 DIAGNOSIS — Z6823 Body mass index (BMI) 23.0-23.9, adult: Secondary | ICD-10-CM | POA: Diagnosis not present

## 2021-04-24 DIAGNOSIS — K589 Irritable bowel syndrome without diarrhea: Secondary | ICD-10-CM | POA: Diagnosis not present

## 2021-04-24 DIAGNOSIS — Z9981 Dependence on supplemental oxygen: Secondary | ICD-10-CM | POA: Diagnosis not present

## 2021-04-24 DIAGNOSIS — I82402 Acute embolism and thrombosis of unspecified deep veins of left lower extremity: Secondary | ICD-10-CM | POA: Diagnosis not present

## 2021-04-25 DIAGNOSIS — I10 Essential (primary) hypertension: Secondary | ICD-10-CM | POA: Diagnosis not present

## 2021-04-25 DIAGNOSIS — I5089 Other heart failure: Secondary | ICD-10-CM | POA: Diagnosis not present

## 2021-04-25 DIAGNOSIS — E43 Unspecified severe protein-calorie malnutrition: Secondary | ICD-10-CM | POA: Diagnosis not present

## 2021-04-25 DIAGNOSIS — I82402 Acute embolism and thrombosis of unspecified deep veins of left lower extremity: Secondary | ICD-10-CM | POA: Diagnosis not present

## 2021-04-25 DIAGNOSIS — Z9221 Personal history of antineoplastic chemotherapy: Secondary | ICD-10-CM | POA: Diagnosis not present

## 2021-04-25 DIAGNOSIS — K219 Gastro-esophageal reflux disease without esophagitis: Secondary | ICD-10-CM | POA: Diagnosis not present

## 2021-04-25 DIAGNOSIS — E039 Hypothyroidism, unspecified: Secondary | ICD-10-CM | POA: Diagnosis not present

## 2021-04-25 DIAGNOSIS — E785 Hyperlipidemia, unspecified: Secondary | ICD-10-CM | POA: Diagnosis not present

## 2021-04-25 DIAGNOSIS — Z6823 Body mass index (BMI) 23.0-23.9, adult: Secondary | ICD-10-CM | POA: Diagnosis not present

## 2021-04-25 DIAGNOSIS — T451X5D Adverse effect of antineoplastic and immunosuppressive drugs, subsequent encounter: Secondary | ICD-10-CM | POA: Diagnosis not present

## 2021-04-25 DIAGNOSIS — R634 Abnormal weight loss: Secondary | ICD-10-CM | POA: Diagnosis not present

## 2021-04-25 DIAGNOSIS — R911 Solitary pulmonary nodule: Secondary | ICD-10-CM | POA: Diagnosis not present

## 2021-04-25 DIAGNOSIS — K589 Irritable bowel syndrome without diarrhea: Secondary | ICD-10-CM | POA: Diagnosis not present

## 2021-04-25 DIAGNOSIS — F0394 Unspecified dementia, unspecified severity, with anxiety: Secondary | ICD-10-CM | POA: Diagnosis not present

## 2021-04-25 DIAGNOSIS — M797 Fibromyalgia: Secondary | ICD-10-CM | POA: Diagnosis not present

## 2021-04-25 DIAGNOSIS — C7989 Secondary malignant neoplasm of other specified sites: Secondary | ICD-10-CM | POA: Diagnosis not present

## 2021-04-25 DIAGNOSIS — N3281 Overactive bladder: Secondary | ICD-10-CM | POA: Diagnosis not present

## 2021-04-25 DIAGNOSIS — Z9981 Dependence on supplemental oxygen: Secondary | ICD-10-CM | POA: Diagnosis not present

## 2021-04-25 DIAGNOSIS — R630 Anorexia: Secondary | ICD-10-CM | POA: Diagnosis not present

## 2021-04-25 DIAGNOSIS — J9691 Respiratory failure, unspecified with hypoxia: Secondary | ICD-10-CM | POA: Diagnosis not present

## 2021-04-25 DIAGNOSIS — F0393 Unspecified dementia, unspecified severity, with mood disturbance: Secondary | ICD-10-CM | POA: Diagnosis not present

## 2021-04-25 DIAGNOSIS — C50919 Malignant neoplasm of unspecified site of unspecified female breast: Secondary | ICD-10-CM | POA: Diagnosis not present

## 2021-04-25 DIAGNOSIS — J309 Allergic rhinitis, unspecified: Secondary | ICD-10-CM | POA: Diagnosis not present

## 2021-04-25 DIAGNOSIS — I2699 Other pulmonary embolism without acute cor pulmonale: Secondary | ICD-10-CM | POA: Diagnosis not present

## 2021-04-25 DIAGNOSIS — E8809 Other disorders of plasma-protein metabolism, not elsewhere classified: Secondary | ICD-10-CM | POA: Diagnosis not present

## 2021-04-30 ENCOUNTER — Encounter: Payer: Self-pay | Admitting: Oncology

## 2021-05-01 DIAGNOSIS — I2699 Other pulmonary embolism without acute cor pulmonale: Secondary | ICD-10-CM | POA: Diagnosis not present

## 2021-05-01 DIAGNOSIS — J9611 Chronic respiratory failure with hypoxia: Secondary | ICD-10-CM | POA: Diagnosis not present

## 2021-05-01 DIAGNOSIS — F0150 Vascular dementia without behavioral disturbance: Secondary | ICD-10-CM | POA: Diagnosis not present

## 2021-05-01 DIAGNOSIS — C3492 Malignant neoplasm of unspecified part of left bronchus or lung: Secondary | ICD-10-CM | POA: Diagnosis not present

## 2021-05-01 DIAGNOSIS — C50912 Malignant neoplasm of unspecified site of left female breast: Secondary | ICD-10-CM | POA: Diagnosis not present

## 2021-05-01 DIAGNOSIS — E440 Moderate protein-calorie malnutrition: Secondary | ICD-10-CM | POA: Diagnosis not present

## 2021-05-01 DIAGNOSIS — K581 Irritable bowel syndrome with constipation: Secondary | ICD-10-CM | POA: Diagnosis not present

## 2021-05-01 DIAGNOSIS — F39 Unspecified mood [affective] disorder: Secondary | ICD-10-CM | POA: Diagnosis not present

## 2021-05-02 ENCOUNTER — Inpatient Hospital Stay: Payer: Medicare Other | Admitting: Hospice and Palliative Medicine

## 2021-05-02 ENCOUNTER — Inpatient Hospital Stay: Payer: Medicare Other | Attending: Oncology

## 2021-05-02 ENCOUNTER — Inpatient Hospital Stay: Payer: Medicare Other | Admitting: Oncology

## 2021-05-03 DIAGNOSIS — J9691 Respiratory failure, unspecified with hypoxia: Secondary | ICD-10-CM | POA: Diagnosis not present

## 2021-05-03 DIAGNOSIS — C50919 Malignant neoplasm of unspecified site of unspecified female breast: Secondary | ICD-10-CM | POA: Diagnosis not present

## 2021-05-03 DIAGNOSIS — C7989 Secondary malignant neoplasm of other specified sites: Secondary | ICD-10-CM | POA: Diagnosis not present

## 2021-05-03 DIAGNOSIS — R911 Solitary pulmonary nodule: Secondary | ICD-10-CM | POA: Diagnosis not present

## 2021-05-03 DIAGNOSIS — I2699 Other pulmonary embolism without acute cor pulmonale: Secondary | ICD-10-CM | POA: Diagnosis not present

## 2021-05-03 DIAGNOSIS — I82402 Acute embolism and thrombosis of unspecified deep veins of left lower extremity: Secondary | ICD-10-CM | POA: Diagnosis not present

## 2021-05-06 DIAGNOSIS — R911 Solitary pulmonary nodule: Secondary | ICD-10-CM | POA: Diagnosis not present

## 2021-05-06 DIAGNOSIS — I82402 Acute embolism and thrombosis of unspecified deep veins of left lower extremity: Secondary | ICD-10-CM | POA: Diagnosis not present

## 2021-05-06 DIAGNOSIS — C7989 Secondary malignant neoplasm of other specified sites: Secondary | ICD-10-CM | POA: Diagnosis not present

## 2021-05-06 DIAGNOSIS — J9691 Respiratory failure, unspecified with hypoxia: Secondary | ICD-10-CM | POA: Diagnosis not present

## 2021-05-06 DIAGNOSIS — I2699 Other pulmonary embolism without acute cor pulmonale: Secondary | ICD-10-CM | POA: Diagnosis not present

## 2021-05-06 DIAGNOSIS — C50919 Malignant neoplasm of unspecified site of unspecified female breast: Secondary | ICD-10-CM | POA: Diagnosis not present

## 2021-05-08 DIAGNOSIS — J9691 Respiratory failure, unspecified with hypoxia: Secondary | ICD-10-CM | POA: Diagnosis not present

## 2021-05-08 DIAGNOSIS — C7989 Secondary malignant neoplasm of other specified sites: Secondary | ICD-10-CM | POA: Diagnosis not present

## 2021-05-08 DIAGNOSIS — I2699 Other pulmonary embolism without acute cor pulmonale: Secondary | ICD-10-CM | POA: Diagnosis not present

## 2021-05-08 DIAGNOSIS — C50919 Malignant neoplasm of unspecified site of unspecified female breast: Secondary | ICD-10-CM | POA: Diagnosis not present

## 2021-05-08 DIAGNOSIS — I82402 Acute embolism and thrombosis of unspecified deep veins of left lower extremity: Secondary | ICD-10-CM | POA: Diagnosis not present

## 2021-05-08 DIAGNOSIS — R911 Solitary pulmonary nodule: Secondary | ICD-10-CM | POA: Diagnosis not present

## 2021-05-14 DIAGNOSIS — C50919 Malignant neoplasm of unspecified site of unspecified female breast: Secondary | ICD-10-CM | POA: Diagnosis not present

## 2021-05-14 DIAGNOSIS — I82402 Acute embolism and thrombosis of unspecified deep veins of left lower extremity: Secondary | ICD-10-CM | POA: Diagnosis not present

## 2021-05-14 DIAGNOSIS — I2699 Other pulmonary embolism without acute cor pulmonale: Secondary | ICD-10-CM | POA: Diagnosis not present

## 2021-05-14 DIAGNOSIS — J9691 Respiratory failure, unspecified with hypoxia: Secondary | ICD-10-CM | POA: Diagnosis not present

## 2021-05-14 DIAGNOSIS — C7989 Secondary malignant neoplasm of other specified sites: Secondary | ICD-10-CM | POA: Diagnosis not present

## 2021-05-14 DIAGNOSIS — R911 Solitary pulmonary nodule: Secondary | ICD-10-CM | POA: Diagnosis not present

## 2021-05-16 ENCOUNTER — Other Ambulatory Visit (HOSPITAL_COMMUNITY): Payer: Self-pay

## 2021-05-23 DIAGNOSIS — I2699 Other pulmonary embolism without acute cor pulmonale: Secondary | ICD-10-CM | POA: Diagnosis not present

## 2021-05-23 DIAGNOSIS — I82402 Acute embolism and thrombosis of unspecified deep veins of left lower extremity: Secondary | ICD-10-CM | POA: Diagnosis not present

## 2021-05-23 DIAGNOSIS — J9691 Respiratory failure, unspecified with hypoxia: Secondary | ICD-10-CM | POA: Diagnosis not present

## 2021-05-23 DIAGNOSIS — C50919 Malignant neoplasm of unspecified site of unspecified female breast: Secondary | ICD-10-CM | POA: Diagnosis not present

## 2021-05-23 DIAGNOSIS — C7989 Secondary malignant neoplasm of other specified sites: Secondary | ICD-10-CM | POA: Diagnosis not present

## 2021-05-23 DIAGNOSIS — R911 Solitary pulmonary nodule: Secondary | ICD-10-CM | POA: Diagnosis not present

## 2021-06-06 ENCOUNTER — Other Ambulatory Visit (HOSPITAL_COMMUNITY): Payer: Self-pay

## 2021-07-03 DIAGNOSIS — G6182 Multifocal motor neuropathy: Secondary | ICD-10-CM

## 2021-07-03 DIAGNOSIS — F0150 Vascular dementia without behavioral disturbance: Secondary | ICD-10-CM | POA: Diagnosis not present

## 2021-07-03 DIAGNOSIS — C3400 Malignant neoplasm of unspecified main bronchus: Secondary | ICD-10-CM

## 2021-07-03 DIAGNOSIS — F39 Unspecified mood [affective] disorder: Secondary | ICD-10-CM | POA: Diagnosis not present

## 2021-07-03 DIAGNOSIS — K219 Gastro-esophageal reflux disease without esophagitis: Secondary | ICD-10-CM

## 2021-07-03 DIAGNOSIS — N183 Chronic kidney disease, stage 3 unspecified: Secondary | ICD-10-CM

## 2021-07-03 DIAGNOSIS — G3184 Mild cognitive impairment, so stated: Secondary | ICD-10-CM

## 2021-07-03 DIAGNOSIS — I872 Venous insufficiency (chronic) (peripheral): Secondary | ICD-10-CM

## 2021-07-03 DIAGNOSIS — I700 Atherosclerosis of aorta: Secondary | ICD-10-CM

## 2021-07-03 DIAGNOSIS — E039 Hypothyroidism, unspecified: Secondary | ICD-10-CM

## 2021-07-03 DIAGNOSIS — E43 Unspecified severe protein-calorie malnutrition: Secondary | ICD-10-CM | POA: Diagnosis not present

## 2021-07-03 DIAGNOSIS — C50919 Malignant neoplasm of unspecified site of unspecified female breast: Secondary | ICD-10-CM

## 2021-07-03 DIAGNOSIS — I82409 Acute embolism and thrombosis of unspecified deep veins of unspecified lower extremity: Secondary | ICD-10-CM

## 2021-07-03 DIAGNOSIS — K5900 Constipation, unspecified: Secondary | ICD-10-CM | POA: Diagnosis not present

## 2021-07-03 DIAGNOSIS — J9620 Acute and chronic respiratory failure, unspecified whether with hypoxia or hypercapnia: Secondary | ICD-10-CM

## 2021-07-22 ENCOUNTER — Ambulatory Visit: Payer: Medicare Other | Admitting: Oncology

## 2021-07-22 ENCOUNTER — Other Ambulatory Visit: Payer: Medicare Other

## 2021-07-25 ENCOUNTER — Encounter: Payer: Self-pay | Admitting: Oncology

## 2021-09-09 DIAGNOSIS — F39 Unspecified mood [affective] disorder: Secondary | ICD-10-CM | POA: Diagnosis not present

## 2021-09-09 DIAGNOSIS — F0150 Vascular dementia without behavioral disturbance: Secondary | ICD-10-CM | POA: Diagnosis not present

## 2021-09-09 DIAGNOSIS — I2782 Chronic pulmonary embolism: Secondary | ICD-10-CM | POA: Diagnosis not present

## 2021-09-09 DIAGNOSIS — C3490 Malignant neoplasm of unspecified part of unspecified bronchus or lung: Secondary | ICD-10-CM | POA: Diagnosis not present

## 2021-09-24 ENCOUNTER — Encounter: Payer: Self-pay | Admitting: Oncology

## 2021-09-25 DIAGNOSIS — B351 Tinea unguium: Secondary | ICD-10-CM

## 2021-10-15 DIAGNOSIS — E441 Mild protein-calorie malnutrition: Secondary | ICD-10-CM | POA: Diagnosis not present

## 2021-11-01 DIAGNOSIS — R5381 Other malaise: Secondary | ICD-10-CM

## 2021-11-01 DIAGNOSIS — E43 Unspecified severe protein-calorie malnutrition: Secondary | ICD-10-CM | POA: Diagnosis not present

## 2021-11-01 DIAGNOSIS — N183 Chronic kidney disease, stage 3 unspecified: Secondary | ICD-10-CM

## 2021-11-01 DIAGNOSIS — F39 Unspecified mood [affective] disorder: Secondary | ICD-10-CM | POA: Diagnosis not present

## 2021-11-01 DIAGNOSIS — C3400 Malignant neoplasm of unspecified main bronchus: Secondary | ICD-10-CM

## 2021-11-01 DIAGNOSIS — F0150 Vascular dementia without behavioral disturbance: Secondary | ICD-10-CM | POA: Diagnosis not present

## 2021-11-01 DIAGNOSIS — I872 Venous insufficiency (chronic) (peripheral): Secondary | ICD-10-CM

## 2021-11-01 DIAGNOSIS — E039 Hypothyroidism, unspecified: Secondary | ICD-10-CM

## 2021-11-01 DIAGNOSIS — K5900 Constipation, unspecified: Secondary | ICD-10-CM | POA: Diagnosis not present

## 2021-11-01 DIAGNOSIS — Z853 Personal history of malignant neoplasm of breast: Secondary | ICD-10-CM

## 2021-12-26 DIAGNOSIS — I2699 Other pulmonary embolism without acute cor pulmonale: Secondary | ICD-10-CM

## 2021-12-26 DIAGNOSIS — C7800 Secondary malignant neoplasm of unspecified lung: Secondary | ICD-10-CM

## 2021-12-26 DIAGNOSIS — F0150 Vascular dementia without behavioral disturbance: Secondary | ICD-10-CM | POA: Diagnosis not present

## 2021-12-26 DIAGNOSIS — F39 Unspecified mood [affective] disorder: Secondary | ICD-10-CM | POA: Diagnosis not present

## 2022-01-03 DIAGNOSIS — B351 Tinea unguium: Secondary | ICD-10-CM | POA: Diagnosis not present

## 2022-01-28 ENCOUNTER — Encounter: Payer: Self-pay | Admitting: Oncology

## 2022-03-06 ENCOUNTER — Non-Acute Institutional Stay (SKILLED_NURSING_FACILITY): Payer: BLUE CROSS/BLUE SHIELD | Admitting: Nurse Practitioner

## 2022-03-06 ENCOUNTER — Encounter: Payer: Self-pay | Admitting: Nurse Practitioner

## 2022-03-06 DIAGNOSIS — F39 Unspecified mood [affective] disorder: Secondary | ICD-10-CM

## 2022-03-06 DIAGNOSIS — K5904 Chronic idiopathic constipation: Secondary | ICD-10-CM | POA: Diagnosis not present

## 2022-03-06 DIAGNOSIS — E039 Hypothyroidism, unspecified: Secondary | ICD-10-CM | POA: Diagnosis not present

## 2022-03-06 DIAGNOSIS — C3492 Malignant neoplasm of unspecified part of left bronchus or lung: Secondary | ICD-10-CM

## 2022-03-06 DIAGNOSIS — I2782 Chronic pulmonary embolism: Secondary | ICD-10-CM | POA: Diagnosis not present

## 2022-03-06 DIAGNOSIS — R630 Anorexia: Secondary | ICD-10-CM

## 2022-03-06 DIAGNOSIS — E559 Vitamin D deficiency, unspecified: Secondary | ICD-10-CM

## 2022-03-06 NOTE — Progress Notes (Signed)
Location:  Other Nursing Home Room Number: 035 Place of Service:  SNF (31)  Dewayne Shorter, MD  Patient Care Team: Dewayne Shorter, MD as PCP - General (Family Medicine) Pyrtle, Lajuan Lines, MD as Consulting Physician (Gastroenterology) Telford Nab, RN as Registered Nurse Earlie Server, MD as Consulting Physician (Hematology and Oncology) Debbora Dus, [facility] as Pharmacist (Pharmacist) Burnice Logan, [facility] (Inactive) as Pharmacist (Pharmacist) Ralene Bathe, MD (Dermatology) Festus Aloe, MD as Consulting Physician (Urology)  Extended Emergency Contact Information Primary Emergency Contact: Gracelyn Nurse Address: [address]          [place], [place] [address] Johnnette Litter of [place] Phone: [phone] Mobile Phone: [phone] Relation: Spouse Secondary Emergency Contact: [place] Mobile Phone: [phone] Relation: Sister Preferred language: English  Goals of care: Advanced Directive information    03/06/2022    3:12 PM  Advanced Directives  Does Patient Have a Medical Advance Directive? Yes  Type of Advance Directive Out of facility DNR (pink MOST or yellow form)  Does patient want to make changes to medical advance directive? No - Patient declined  Pre-existing out of facility DNR order (yellow form or pink MOST form) Yellow form placed in chart (order not valid for inpatient use)     Chief Complaint  Patient presents with   Medical Management of Chronic Issues    Routine follow up   Immunizations    Shingrix vaccine,COVID vaccine, flu vaccine    HPI:  Pt is a 81 y.o. female seen today for an [place].  Pt with metastatic lung cancer currently on hospice care at [facility]. She is wheelchair bound and total care except for eating (she is able to feed herself).  Her weight has been stable over the last few months. She reports good appetite.  She denies any pain or shortness of breath.  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     Past Medical History:  Diagnosis Date   AKI (acute kidney injury) ([place]) 08/17/2018   Anxiety    Arthritis    Belching    Bladder disorder    OVERACTIVE   Bowel dysfunction    BLOCKAGE   Cancer (HCC)    breast   Constipation    Depression    Diverticulitis    Fibromyalgia    GERD (gastroesophageal reflux disease)    Hyperlipidemia    IBS (irritable bowel syndrome)    Internal hemorrhoids    Lung cancer ([place]) 2020   Memory deficits    Murmur    asymptomatic   Pneumonia 11/18/12   Urinary incontinence    Vertigo    Past Surgical History:  Procedure Laterality Date   AXILLARY LYMPH NODE BIOPSY Left 06/08/2018   INVASIVE MAMMARY CARCINOMA   BLADDER SUSPENSION  2004, 2012   BREAST BIOPSY Left 06/08/2018   INVASIVE MAMMARY CARCINOMA   CATARACT EXTRACTION W/PHACO Right 08/27/2015   Procedure: CATARACT EXTRACTION PHACO AND INTRAOCULAR LENS PLACEMENT ([place]);  Surgeon: Estill Cotta, MD;  Location: [facility] ORS;  Service: Ophthalmology;  Laterality: Right;  [place]   1:00.2 AP%  22.5 CDE  23.67 fluid casette lot #[id number]  exp05/31/2018   CATARACT EXTRACTION W/PHACO Left 10/15/2015   Procedure: CATARACT EXTRACTION PHACO AND INTRAOCULAR LENS PLACEMENT (IOC);  Surgeon: Estill Cotta, MD;  Location: [facility] ORS;  Service: Ophthalmology;  Laterality: Left;  [place] 01:07 AP% 18.1 CDE 21.57 fluid pack lot # [id number]   CHOLECYSTECTOMY  COLONOSCOPY  2017   ELECTROMAGNETIC NAVIGATION BROCHOSCOPY N/A 07/09/2018   Procedure: ELECTROMAGNETIC NAVIGATION BRONCHOSCOPY;  Surgeon: Flora Lipps, MD;  Location: [facility] ORS;  Service: Cardiopulmonary;  Laterality: N/A;   TONSILLECTOMY  1947    Allergies  Allergen Reactions   Sulfa Antibiotics Rash    Outpatient Encounter Medications as of 03/06/2022  Medication Sig   acetaminophen (TYLENOL) 325 MG tablet Take 325 mg by mouth every 6 (six) hours as needed for mild pain.    albuterol (VENTOLIN HFA)  108 (90 Base) MCG/ACT inhaler Inhale 2 puffs into the lungs every 6 (six) hours as needed for wheezing or shortness of breath.   alum & mag hydroxide-simeth (MAALOX/MYLANTA) 200-200-20 MG/5ML suspension Take 30 mLs by mouth every 6 (six) hours as needed for indigestion or heartburn.   anastrozole (ARIMIDEX) 1 MG tablet Take 1 tablet (1 mg total) by mouth daily.   apixaban (ELIQUIS) 5 MG TABS tablet Take 2 tablets (10 mg total) by mouth 2 (two) times daily for 4 days, THEN 1 tablet (5 mg total) 2 (two) times daily.   benzonatate (TESSALON) 100 MG capsule TAKE 1 CAPSULE BY MOUTH 3 TIMES DAILY ASNEEDED FOR COUGH.   Calcium-Magnesium-Vitamin D (CALCIUM 1200+D3 PO) Take 1 tablet by mouth daily.   crizotinib (XALKORI) 250 MG capsule Take 1 capsule (250 mg total) by mouth 2 (two) times daily.   Dextromethorphan-guaiFENesin (MUCINEX DM) 30-600 MG TB12 Take 1 tablet by mouth 2 (two) times daily as needed (for congestion/cough).   fluticasone (FLONASE) 50 MCG/ACT nasal spray Place 2 sprays into both nostrils daily.   furosemide (LASIX) 40 MG tablet Take 2 tablets (80 mg total) by mouth daily.   levothyroxine (SYNTHROID) 25 MCG tablet TAKE 1 TABLET BY MOUTH DAILY   lubiprostone (AMITIZA) 24 MCG capsule TAKE ONE CAPSULE TWICE A DAY WITH MEALS   memantine (NAMENDA) 10 MG tablet Take 1 tablet (10 mg total) by mouth 2 (two) times daily.   Multiple Vitamin (MULTIVITAMIN WITH MINERALS) TABS tablet Take 1 tablet by mouth daily.   nystatin (MYCOSTATIN) 100000 UNIT/ML suspension Take 5 mLs (500,000 Units total) by mouth 4 (four) times daily. Swish and spit   ondansetron (ZOFRAN) 8 MG tablet TAKE ONE TABLET EVERY EIGHT HOURS AS NEEDED FOR NAUSEA / VOMITING   polyethylene glycol (MIRALAX / GLYCOLAX) 17 g packet Take 17 g by mouth 2 (two) times daily.   potassium chloride SA (KLOR-CON) 20 MEQ tablet Take 20 mEq by mouth 2 (two) times daily.   senna-docusate (SENOKOT-S) 8.6-50 MG tablet Take 1 tablet by mouth 2 (two)  times daily between meals as needed for mild constipation.   venlafaxine XR (EFFEXOR-XR) 150 MG 24 hr capsule TAKE 1 CAPSULE BY MOUTH EVERY DAY   Vitamin D, Ergocalciferol, (DRISDOL) 1.25 MG (50000 UNIT) CAPS capsule Take 50,000 Units by mouth every Sunday at 6pm.   No facility-administered encounter medications on file as of 03/06/2022.    Review of Systems  Unable to perform ROS: Dementia    Immunization History  Administered Date(s) Administered   Fluad Quad(high Dose 65+) 02/01/2019   Influenza Split 02/05/2012   Influenza, High Dose Seasonal PF 03/23/2018   Influenza,inj,Quad PF,6+ Mos 02/15/2015, 02/24/2017   Influenza-Unspecified 02/23/2013   Moderna Sars-Covid-2 Vaccination 08/05/2019, 09/17/2019   Pneumococcal Conjugate-13 12/13/2013   Pneumococcal Polysaccharide-23 07/18/2015   Tdap 08/17/2019   Unspecified SARS-COV-2 Vaccination 02/15/2021   Pertinent  Health Maintenance Due  Topic Date Due   COLONOSCOPY (Pts 45-20yrs Insurance coverage will need to  be confirmed)  02/14/2019   MAMMOGRAM  05/06/2019   INFLUENZA VACCINE  12/24/2021   DEXA SCAN  Completed      02/09/2021    8:15 PM 02/10/2021    9:00 AM 02/10/2021    8:30 PM 02/11/2021    7:00 AM 02/20/2021    3:00 PM  Fall Risk  Patient Fall Risk Level High fall risk High fall risk High fall risk High fall risk Low fall risk   Functional Status Survey:    Vitals:   03/06/22 1504  BP: 128/70  Pulse: 70  Resp: 20  Temp: (!) 97.4 F (36.3 C)  SpO2: 94%  Weight: 147 lb 6.4 oz (66.9 kg)  Height: 5\' 2"  (1.575 m)   Body mass index is 26.96 kg/m. Physical Exam Constitutional:      General: She is not in acute distress.    Appearance: She is well-developed. She is not diaphoretic.  HENT:     Head: Normocephalic and atraumatic.     Mouth/Throat:     Pharynx: No oropharyngeal exudate.  Eyes:     Conjunctiva/sclera: Conjunctivae normal.     Pupils: Pupils are equal, round, and reactive to light.   Cardiovascular:     Rate and Rhythm: Normal rate and regular rhythm.     Heart sounds: Normal heart sounds.  Pulmonary:     Effort: Pulmonary effort is normal.     Breath sounds: Normal breath sounds.  Abdominal:     General: Bowel sounds are normal.     Palpations: Abdomen is soft.  Musculoskeletal:     Cervical back: Normal range of motion and neck supple.     Right lower leg: No edema.     Left lower leg: No edema.  Skin:    General: Skin is warm and dry.  Neurological:     Mental Status: She is alert. Mental status is at baseline.     Motor: Weakness present.     Gait: Gait abnormal.  Psychiatric:        Mood and Affect: Mood normal.     Labs reviewed: Recent Labs    03/21/21 1356  NA 126*  K 4.5  CL 92*  CO2 25  GLUCOSE 192*  BUN 24*  CREATININE 1.31*  CALCIUM 7.1*   Recent Labs    03/21/21 1356  AST 34  ALT 38  ALKPHOS 190*  BILITOT 0.6  PROT 6.7  ALBUMIN 2.6*   Recent Labs    03/21/21 1356  WBC 12.9*  NEUTROABS 8.7*  HGB 9.8*  HCT 30.1*  MCV 93.5  PLT 769*   Lab Results  Component Value Date   TSH 1.607 02/08/2021   Lab Results  Component Value Date   HGBA1C 5.6 06/12/2014   Lab Results  Component Value Date   CHOL 168 11/11/2017   HDL 53.10 11/11/2017   LDLCALC 100 (H) 11/11/2017   LDLDIRECT 175.9 02/05/2012   TRIG 73.0 11/11/2017   CHOLHDL 3 11/11/2017    Significant Diagnostic Results in last 30 days:  No results found.  Assessment/Plan 1. Acquired hypothyroidism -continues on synthroid 25 mcg daily  2. Chronic idiopathic constipation Well controlled at this time.   3. Mood disorder ([place]) Stable on effexor and remeron.   4. Other chronic pulmonary embolism without acute cor pulmonale (HCC) -stable on elquis twice daily, no signs of abnormal bruising or bleeding, will follow up cbc, cmp at this time.   5. Vitamin D deficiency -continues on supplement  6.  Loss of appetite -she has done well on remeron, apparently  did not tolerate coming off this in the past.   7. Primary adenocarcinoma of left lung ([place]) -continues on hospice care. No shortness of breath, chest pains, cough or congestion reported. Continue comfort care.    Carlos American. [place], [facility] [phone]

## 2022-03-10 LAB — COMPREHENSIVE METABOLIC PANEL
Albumin: 3.8 (ref 3.5–5.0)
Calcium: 9.1 (ref 8.7–10.7)
Globulin: 2.3
eGFR: 59

## 2022-03-10 LAB — CBC AND DIFFERENTIAL
HCT: 32 — AB (ref 36–46)
Hemoglobin: 11.2 — AB (ref 12.0–16.0)
Platelets: 305 10*3/uL (ref 150–400)
WBC: 9.5

## 2022-03-10 LAB — HEPATIC FUNCTION PANEL
ALT: 17 U/L (ref 7–35)
AST: 18 (ref 13–35)
Alkaline Phosphatase: 102 (ref 25–125)
Bilirubin, Total: 0.4

## 2022-03-10 LAB — BASIC METABOLIC PANEL
BUN: 33 — AB (ref 4–21)
CO2: 28 — AB (ref 13–22)
Chloride: 104 (ref 99–108)
Creatinine: 1 (ref 0.5–1.1)
Glucose: 100
Potassium: 4.2 mEq/L (ref 3.5–5.1)
Sodium: 140 (ref 137–147)

## 2022-03-10 LAB — CBC: RBC: 3.3 — AB (ref 3.87–5.11)

## 2022-05-05 ENCOUNTER — Encounter: Payer: Self-pay | Admitting: Student

## 2022-05-05 ENCOUNTER — Non-Acute Institutional Stay (SKILLED_NURSING_FACILITY): Payer: Medicare Other | Admitting: Student

## 2022-05-05 DIAGNOSIS — R630 Anorexia: Secondary | ICD-10-CM

## 2022-05-05 DIAGNOSIS — K5904 Chronic idiopathic constipation: Secondary | ICD-10-CM

## 2022-05-05 DIAGNOSIS — F39 Unspecified mood [affective] disorder: Secondary | ICD-10-CM

## 2022-05-05 DIAGNOSIS — F03B0 Unspecified dementia, moderate, without behavioral disturbance, psychotic disturbance, mood disturbance, and anxiety: Secondary | ICD-10-CM

## 2022-05-05 DIAGNOSIS — E039 Hypothyroidism, unspecified: Secondary | ICD-10-CM

## 2022-05-05 DIAGNOSIS — I2782 Chronic pulmonary embolism: Secondary | ICD-10-CM

## 2022-05-05 DIAGNOSIS — C3492 Malignant neoplasm of unspecified part of left bronchus or lung: Secondary | ICD-10-CM

## 2022-05-05 DIAGNOSIS — Z66 Do not resuscitate: Secondary | ICD-10-CM

## 2022-05-05 DIAGNOSIS — E559 Vitamin D deficiency, unspecified: Secondary | ICD-10-CM

## 2022-05-05 NOTE — Progress Notes (Signed)
Location:  Other [place].  Nursing Home Room Number: [id number] Place of Service:  SNF (31) Provider:  Dr. Amada Kingfisher, MD  Patient Care Team: Dewayne Shorter, MD as PCP - General (Family Medicine) Pyrtle, Lajuan Lines, MD as Consulting Physician (Gastroenterology) Telford Nab, RN as Registered Nurse Earlie Server, MD as Consulting Physician (Hematology and Oncology) Debbora Dus, [facility] as Pharmacist (Pharmacist) Burnice Logan, [facility] (Inactive) as Pharmacist (Pharmacist) Ralene Bathe, MD (Dermatology) Festus Aloe, MD as Consulting Physician (Urology)  Extended Emergency Contact Information Primary Emergency Contact: Gracelyn Nurse Address: [address]          [place], [place] [address] Johnnette Litter of [place] Phone: [phone] Mobile Phone: [phone] Relation: Spouse Secondary Emergency Contact: [place] Mobile Phone: [phone] Relation: Sister Preferred language: English  Code Status:  DNR Goals of care: Advanced Directive information    05/05/2022   10:41 AM  Advanced Directives  Does Patient Have a Medical Advance Directive? Yes  Type of Advance Directive Out of facility DNR (pink MOST or yellow form)  Does patient want to make changes to medical advance directive? No - Patient declined     Chief Complaint  Patient presents with   Medical Management of Chronic Issues    Medical Management of Chronic Issues.     HPI:  Pt is a 81 y.o. female seen today for medical management of chronic diseases.    She is sitting in the living area today and has no concerns.   She gives her name and DOB. She says we are in [place] and it's 1922.   Denies issues with urination, constipation, chest pain.   She was a Pharmacist, hospital for 30 years Past Medical History:  Diagnosis Date   AKI (acute kidney injury) ([place]) 08/17/2018   Anxiety    Arthritis    Belching    Bladder disorder    OVERACTIVE   Bowel dysfunction     BLOCKAGE   Cancer ([place])    breast   Constipation    Depression    Diverticulitis    Fibromyalgia    GERD (gastroesophageal reflux disease)    Hyperlipidemia    IBS (irritable bowel syndrome)    Internal hemorrhoids    Lung cancer ([place]) 2020   Memory deficits    Murmur    asymptomatic   Pneumonia 11/18/12   Urinary incontinence    Vertigo    Past Surgical History:  Procedure Laterality Date   AXILLARY LYMPH NODE BIOPSY Left 06/08/2018   INVASIVE MAMMARY CARCINOMA   BLADDER SUSPENSION  2004, 2012   BREAST BIOPSY Left 06/08/2018   INVASIVE MAMMARY CARCINOMA   CATARACT EXTRACTION W/PHACO Right 08/27/2015   Procedure: CATARACT EXTRACTION PHACO AND INTRAOCULAR LENS PLACEMENT ([place]);  Surgeon: Estill Cotta, MD;  Location: [facility] ORS;  Service: Ophthalmology;  Laterality: Right;  [place]   1:00.2 AP%  22.5 CDE  23.67 fluid casette lot #[id number]  exp05/31/2018   CATARACT EXTRACTION W/PHACO Left 10/15/2015   Procedure: CATARACT EXTRACTION PHACO AND INTRAOCULAR LENS PLACEMENT (IOC);  Surgeon: Estill Cotta, MD;  Location: [facility] ORS;  Service: Ophthalmology;  Laterality: Left;  [place] 01:07 AP% 18.1 CDE 21.57 fluid pack lot # [id number]   CHOLECYSTECTOMY     COLONOSCOPY  2017   ELECTROMAGNETIC NAVIGATION BROCHOSCOPY N/A 07/09/2018   Procedure: ELECTROMAGNETIC NAVIGATION BRONCHOSCOPY;  Surgeon: Flora Lipps, MD;  Location: [facility] ORS;  Service: Cardiopulmonary;  Laterality: N/A;   TONSILLECTOMY  1947    Allergies  Allergen Reactions   Sulfa Antibiotics Rash    Outpatient Encounter Medications as of 05/05/2022  Medication Sig   acetaminophen (TYLENOL) 325 MG tablet Take 325 mg by mouth every 6 (six) hours as needed for mild pain.    alum & mag hydroxide-simeth (MAALOX/MYLANTA) 200-200-20 MG/5ML suspension Take 30 mLs by mouth every 6 (six) hours as needed for indigestion or heartburn.   apixaban (ELIQUIS) 5 MG TABS tablet Take 5 mg by mouth 2 (two) times daily.   fluticasone (FLONASE) 50  MCG/ACT nasal spray Place 2 sprays into both nostrils daily.   Infant Care Products [facility]) OINT Apply to buttocks,Coccyx, Sacrum topically as needed for redness.   levothyroxine (SYNTHROID) 25 MCG tablet TAKE 1 TABLET BY MOUTH DAILY   mirtazapine (REMERON) 7.5 MG tablet Take 7.5 mg by mouth at bedtime.   morphine 20 MG/5ML solution Take 0.25-0.5 mLs by mouth every hour as needed for pain.   Multiple Vitamin (MULTIVITAMIN WITH MINERALS) TABS tablet Take 1 tablet by mouth daily.   omeprazole (PRILOSEC) 40 MG capsule Take 40 mg by mouth daily.   ondansetron (ZOFRAN) 8 MG tablet TAKE ONE TABLET EVERY EIGHT HOURS AS NEEDED FOR NAUSEA / VOMITING   sennosides-docusate sodium (SENOKOT-S) 8.6-50 MG tablet Take 2 tablets by mouth daily.   venlafaxine (EFFEXOR) 75 MG tablet Take 75 mg by mouth daily.   Vitamin D, Cholecalciferol, 25 MCG (1000 UT) TABS Take 1 tablet by mouth daily.   [DISCONTINUED] albuterol (VENTOLIN HFA) 108 (90 Base) MCG/ACT inhaler Inhale 2 puffs into the lungs every 6 (six) hours as needed for wheezing or shortness of breath.   [DISCONTINUED] anastrozole (ARIMIDEX) 1 MG tablet Take 1 tablet (1 mg total) by mouth daily.   [DISCONTINUED] apixaban (ELIQUIS) 5 MG TABS tablet Take 2 tablets (10 mg total) by mouth 2 (two) times daily for 4 days, THEN 1 tablet (5 mg total) 2 (two) times daily.   [DISCONTINUED] benzonatate (TESSALON) 100 MG capsule TAKE 1 CAPSULE BY MOUTH 3 TIMES DAILY ASNEEDED FOR COUGH.   [DISCONTINUED] Calcium-Magnesium-Vitamin D (CALCIUM 1200+D3 PO) Take 1 tablet by mouth daily.   [DISCONTINUED] crizotinib (XALKORI) 250 MG capsule Take 1 capsule (250 mg total) by mouth 2 (two) times daily.   [DISCONTINUED] Dextromethorphan-guaiFENesin (MUCINEX DM) 30-600 MG TB12 Take 1 tablet by mouth 2 (two) times daily as needed (for congestion/cough).   [DISCONTINUED] furosemide (LASIX) 40 MG tablet Take 2 tablets (80 mg total) by mouth daily.   [DISCONTINUED] lubiprostone (AMITIZA)  24 MCG capsule TAKE ONE CAPSULE TWICE A DAY WITH MEALS   [DISCONTINUED] memantine (NAMENDA) 10 MG tablet Take 1 tablet (10 mg total) by mouth 2 (two) times daily.   [DISCONTINUED] nystatin (MYCOSTATIN) 100000 UNIT/ML suspension Take 5 mLs (500,000 Units total) by mouth 4 (four) times daily. Swish and spit   [DISCONTINUED] polyethylene glycol (MIRALAX / GLYCOLAX) 17 g packet Take 17 g by mouth 2 (two) times daily.   [DISCONTINUED] potassium chloride SA (KLOR-CON) 20 MEQ tablet Take 20 mEq by mouth 2 (two) times daily.   [DISCONTINUED] senna-docusate (SENOKOT-S) 8.6-50 MG tablet Take 1 tablet by mouth 2 (two) times daily between meals as needed for mild constipation. (Patient taking differently: Take 2 tablets by mouth at bedtime.)   [DISCONTINUED] venlafaxine XR (EFFEXOR-XR) 150 MG 24 hr capsule TAKE 1 CAPSULE BY MOUTH EVERY DAY   [DISCONTINUED] Vitamin D, Ergocalciferol, (DRISDOL) 1.25 MG (50000 UNIT) CAPS capsule Take 50,000 Units by mouth every Sunday at 6pm.   No facility-administered encounter medications  on file as of 05/05/2022.    Review of Systems  All other systems reviewed and are negative.   Immunization History  Administered Date(s) Administered   Fluad Quad(high Dose 65+) 02/01/2019   Influenza Split 02/05/2012   Influenza, High Dose Seasonal PF 03/23/2018   Influenza,inj,Quad PF,6+ Mos 02/15/2015, 02/24/2017   Influenza-Unspecified 02/23/2013, 03/11/2022   Moderna Sars-Covid-2 Vaccination 08/05/2019, 09/17/2019   Pneumococcal Conjugate-13 12/13/2013   Pneumococcal Polysaccharide-23 07/18/2015   Tdap 08/17/2019   Unspecified SARS-COV-2 Vaccination 02/15/2021   Pertinent  Health Maintenance Due  Topic Date Due   COLONOSCOPY (Pts 45-42yrs Insurance coverage will need to be confirmed)  02/14/2019   MAMMOGRAM  05/06/2019   INFLUENZA VACCINE  Completed   DEXA SCAN  Completed      02/09/2021    8:15 PM 02/10/2021    9:00 AM 02/10/2021    8:30 PM 02/11/2021    7:00 AM  02/20/2021    3:00 PM  Fall Risk  Patient Fall Risk Level High fall risk High fall risk High fall risk High fall risk Low fall risk   Functional Status Survey:    Vitals:   05/05/22 1022  BP: 109/68  Pulse: 68  Temp: (!) 97.5 F (36.4 C)  SpO2: 95%  Weight: 146 lb 9.6 oz (66.5 kg)  Height: 5\' 2"  (1.575 m)   Body mass index is 26.81 kg/m. Physical Exam Constitutional:      Comments: Sitting in wheelchair  Cardiovascular:     Pulses: Normal pulses.     Heart sounds: Normal heart sounds.  Pulmonary:     Effort: Pulmonary effort is normal.     Breath sounds: Normal breath sounds.  Abdominal:     General: Abdomen is flat. Bowel sounds are normal.     Palpations: Abdomen is soft.  Skin:    General: Skin is warm and dry.  Neurological:     Mental Status: She is alert. Mental status is at baseline.     Labs reviewed: Recent Labs    03/10/22 0000  NA 140  K 4.2  CL 104  CO2 28*  BUN 33*  CREATININE 1.0  CALCIUM 9.1   Recent Labs    03/10/22 0000  AST 18  ALT 17  ALKPHOS 102  ALBUMIN 3.8   Recent Labs    03/10/22 0000  WBC 9.5  HGB 11.2*  HCT 32*  PLT 305   Lab Results  Component Value Date   TSH 1.607 02/08/2021   Lab Results  Component Value Date   HGBA1C 5.6 06/12/2014   Lab Results  Component Value Date   CHOL 168 11/11/2017   HDL 53.10 11/11/2017   LDLCALC 100 (H) 11/11/2017   LDLDIRECT 175.9 02/05/2012   TRIG 73.0 11/11/2017   CHOLHDL 3 11/11/2017    Significant Diagnostic Results in last 30 days:  No results found.  Assessment/Plan 1. Other chronic pulmonary embolism without acute cor pulmonale (HCC) 7. Primary adenocarcinoma of left lung ([place]) Stable on eliquis daily. No signs of bleeding or bruising. Continues hospice care. No shortness of breath, chest pain, cough reported. Comfort meds available if indicated with morphine, however, not currently in use.   2. Mood disorder ([place]) Mood stable at this time. Continue mitazapine  7.5 mg nightly, venlafaxine 75 mg nightly.   3. Acquired hypothyroidism Continue levothyroxine 25 mcg daily.   4. Chronic idiopathic constipation No concerns at this time. Continue senokot prn.   5. Vitamin D deficiency Continue vitamin d supplement  6. Loss of appetite Nursing state PO intake is stable. Continue mirtazapine 7.5 mg daily. Weight stable.   8. Moderate dementia without behavioral disturbance, psychotic disturbance, mood disturbance, or anxiety, unspecified dementia type [facility]) Patient requires nursing level care and assistance. Wheelchair bound. Weight stable.  9. DNR (do not resuscitate) Patient maintains DNR status   Family/ staff Communication: nursing  Labs/tests ordered:  quarterly cbc, bmp

## 2022-06-13 ENCOUNTER — Encounter: Payer: Self-pay | Admitting: Oncology

## 2022-06-26 ENCOUNTER — Encounter: Payer: Self-pay | Admitting: Nurse Practitioner

## 2022-06-26 ENCOUNTER — Non-Acute Institutional Stay (SKILLED_NURSING_FACILITY): Payer: Medicare Other | Admitting: Nurse Practitioner

## 2022-06-26 DIAGNOSIS — E559 Vitamin D deficiency, unspecified: Secondary | ICD-10-CM

## 2022-06-26 DIAGNOSIS — F03B0 Unspecified dementia, moderate, without behavioral disturbance, psychotic disturbance, mood disturbance, and anxiety: Secondary | ICD-10-CM

## 2022-06-26 DIAGNOSIS — K5904 Chronic idiopathic constipation: Secondary | ICD-10-CM | POA: Diagnosis not present

## 2022-06-26 DIAGNOSIS — E039 Hypothyroidism, unspecified: Secondary | ICD-10-CM

## 2022-06-26 DIAGNOSIS — F39 Unspecified mood [affective] disorder: Secondary | ICD-10-CM

## 2022-06-26 DIAGNOSIS — R630 Anorexia: Secondary | ICD-10-CM

## 2022-06-26 DIAGNOSIS — I2782 Chronic pulmonary embolism: Secondary | ICD-10-CM

## 2022-06-26 NOTE — Progress Notes (Signed)
Location:  Other Nursing Home Room Number: 109N Place of Service:  SNF (31)  Dewayne Shorter, MD  Patient Care Team: Dewayne Shorter, MD as PCP - General (Family Medicine) Pyrtle, Lajuan Lines, MD as Consulting Physician (Gastroenterology) Telford Nab, RN as Registered Nurse Earlie Server, MD as Consulting Physician (Hematology and Oncology) Debbora Dus, [facility] as Pharmacist (Pharmacist) Burnice Logan, [facility] (Inactive) as Pharmacist (Pharmacist) Ralene Bathe, MD (Dermatology) Festus Aloe, MD as Consulting Physician (Urology)  Extended Emergency Contact Information Primary Emergency Contact: Gracelyn Nurse Address: [address]          [place], [place] [address] Johnnette Litter of [place] Phone: [phone] Mobile Phone: [phone] Relation: Spouse Secondary Emergency Contact: [place] Mobile Phone: [phone] Relation: Sister Preferred language: English  Goals of care: Advanced Directive information    06/26/2022    2:52 PM  Advanced Directives  Does Patient Have a Medical Advance Directive? Yes  Type of Advance Directive Out of facility DNR (pink MOST or yellow form)  Does patient want to make changes to medical advance directive? No - Patient declined  Pre-existing out of facility DNR order (yellow form or pink MOST form) Yellow form placed in chart (order not valid for inpatient use)     Chief Complaint  Patient presents with   Medical Management of Chronic Issues    Routine follow up visit.   Immunizations    Shingrix vaccine and a COVID booster due   Quality Metric Gaps    Colonoscopy and mammogram due    HPI:  Pt is a 82 y.o. female seen today for medical management of chronic disease.  Nursing has no concerns. She is eating well. Continues to gain weight on supplement and remeron.  No recent falls.  No increase in behaviors.  She has dementia and is pleasantly confused.    Past Medical History:  Diagnosis Date   AKI (acute  kidney injury) ([place]) 08/17/2018   Anxiety    Arthritis    Belching    Bladder disorder    OVERACTIVE   Bowel dysfunction    BLOCKAGE   Cancer (HCC)    breast   Constipation    Depression    Diverticulitis    Fibromyalgia    GERD (gastroesophageal reflux disease)    Hyperlipidemia    IBS (irritable bowel syndrome)    Internal hemorrhoids    Lung cancer ([place]) 2020   Memory deficits    Murmur    asymptomatic   Pneumonia 11/18/12   Urinary incontinence    Vertigo    Past Surgical History:  Procedure Laterality Date   AXILLARY LYMPH NODE BIOPSY Left 06/08/2018   INVASIVE MAMMARY CARCINOMA   BLADDER SUSPENSION  2004, 2012   BREAST BIOPSY Left 06/08/2018   INVASIVE MAMMARY CARCINOMA   CATARACT EXTRACTION W/PHACO Right 08/27/2015   Procedure: CATARACT EXTRACTION PHACO AND INTRAOCULAR LENS PLACEMENT ([place]);  Surgeon: Estill Cotta, MD;  Location: [facility] ORS;  Service: Ophthalmology;  Laterality: Right;  [place]   1:00.2 AP%  22.5 CDE  23.67 fluid casette lot #[id number]  exp05/31/2018   CATARACT EXTRACTION W/PHACO Left 10/15/2015   Procedure: CATARACT EXTRACTION PHACO AND INTRAOCULAR LENS PLACEMENT (IOC);  Surgeon: Estill Cotta, MD;  Location: [facility] ORS;  Service: Ophthalmology;  Laterality: Left;  [place] 01:07 AP% 18.1 CDE 21.57 fluid pack lot # [id number]   CHOLECYSTECTOMY     COLONOSCOPY  2017   ELECTROMAGNETIC NAVIGATION BROCHOSCOPY N/A 07/09/2018   Procedure: ELECTROMAGNETIC NAVIGATION BRONCHOSCOPY;  Surgeon: Flora Lipps,  MD;  Location: [facility] ORS;  Service: Cardiopulmonary;  Laterality: N/A;   TONSILLECTOMY  1947    Allergies  Allergen Reactions   Sulfa Antibiotics Rash    Outpatient Encounter Medications as of 06/26/2022  Medication Sig   acetaminophen (TYLENOL) 325 MG tablet Take 325 mg by mouth every 6 (six) hours as needed for mild pain.    alum & mag hydroxide-simeth (MAALOX/MYLANTA) 200-200-20 MG/5ML suspension Take 30 mLs by mouth every 6 (six) hours as needed for  indigestion or heartburn.   apixaban (ELIQUIS) 5 MG TABS tablet Take 5 mg by mouth 2 (two) times daily.   fluticasone (FLONASE) 50 MCG/ACT nasal spray Place 2 sprays into both nostrils daily.   Infant Care Products [facility]) OINT Apply to buttocks,Coccyx, Sacrum topically as needed for redness.   levothyroxine (SYNTHROID) 25 MCG tablet TAKE 1 TABLET BY MOUTH DAILY   mirtazapine (REMERON) 7.5 MG tablet Take 7.5 mg by mouth at bedtime.   morphine 20 MG/5ML solution Take 0.25 mLs by mouth every hour as needed for pain. As needed for medium level of pain and SOB   morphine 20 MG/5ML solution Take 0.5 mLs by mouth every 2 (two) hours as needed for pain. For high level of pain and SOB   Multiple Vitamin (MULTIVITAMIN WITH MINERALS) TABS tablet Take 1 tablet by mouth daily.   omeprazole (PRILOSEC) 40 MG capsule Take 40 mg by mouth daily.   ondansetron (ZOFRAN) 8 MG tablet TAKE ONE TABLET EVERY EIGHT HOURS AS NEEDED FOR NAUSEA / VOMITING   polyethylene glycol powder (MIRALAX) 17 GM/SCOOP powder Take 1 Container by mouth daily as needed.   sennosides-docusate sodium (SENOKOT-S) 8.6-50 MG tablet Take 2 tablets by mouth daily.   venlafaxine (EFFEXOR) 75 MG tablet Take 75 mg by mouth daily.   Vitamin D, Cholecalciferol, 25 MCG (1000 UT) TABS Take 1 tablet by mouth daily.   No facility-administered encounter medications on file as of 06/26/2022.    Review of Systems  Unable to perform ROS: Dementia    Immunization History  Administered Date(s) Administered   Fluad Quad(high Dose 65+) 02/01/2019   Influenza Split 02/05/2012   Influenza, High Dose Seasonal PF 03/23/2018   Influenza,inj,Quad PF,6+ Mos 02/15/2015, 02/24/2017   Influenza-Unspecified 02/23/2013, 03/11/2022   Moderna Sars-Covid-2 Vaccination 08/05/2019, 09/17/2019   Pneumococcal Conjugate-13 12/13/2013   Pneumococcal Polysaccharide-23 07/18/2015   Tdap 08/17/2019   Unspecified SARS-COV-2 Vaccination 02/15/2021   Pertinent  Health  Maintenance Due  Topic Date Due   INFLUENZA VACCINE  Completed   DEXA SCAN  Completed   COLONOSCOPY (Pts 45-89yrs Insurance coverage will need to be confirmed)  Discontinued      02/09/2021    8:15 PM 02/10/2021    9:00 AM 02/10/2021    8:30 PM 02/11/2021    7:00 AM 02/20/2021    3:00 PM  Fall Risk  (RETIRED) Patient Fall Risk Level High fall risk High fall risk High fall risk High fall risk Low fall risk   Functional Status Survey:    Vitals:   06/26/22 1448  BP: 106/70  Pulse: 60  Resp: (!) 22  Temp: 98.3 F (36.8 C)  SpO2: 96%  Height: 5\' 2"  (1.575 m)   Body mass index is 26.81 kg/m. Physical Exam Constitutional:      General: She is not in acute distress.    Appearance: She is well-developed. She is not diaphoretic.  HENT:     Head: Normocephalic and atraumatic.     Mouth/Throat:  Pharynx: No oropharyngeal exudate.  Eyes:     Conjunctiva/sclera: Conjunctivae normal.     Pupils: Pupils are equal, round, and reactive to light.  Cardiovascular:     Rate and Rhythm: Normal rate and regular rhythm.     Heart sounds: Normal heart sounds.  Pulmonary:     Effort: Pulmonary effort is normal.     Breath sounds: Normal breath sounds.  Abdominal:     General: Bowel sounds are normal.     Palpations: Abdomen is soft.  Musculoskeletal:     Cervical back: Normal range of motion and neck supple.     Right lower leg: No edema.     Left lower leg: No edema.  Skin:    General: Skin is warm and dry.  Neurological:     Mental Status: She is alert. Mental status is at baseline.  Psychiatric:        Mood and Affect: Mood normal.     Labs reviewed: Recent Labs    03/10/22 0000  NA 140  K 4.2  CL 104  CO2 28*  BUN 33*  CREATININE 1.0  CALCIUM 9.1   Recent Labs    03/10/22 0000  AST 18  ALT 17  ALKPHOS 102  ALBUMIN 3.8   Recent Labs    03/10/22 0000  WBC 9.5  HGB 11.2*  HCT 32*  PLT 305   Lab Results  Component Value Date   TSH 1.607 02/08/2021    Lab Results  Component Value Date   HGBA1C 5.6 06/12/2014   Lab Results  Component Value Date   CHOL 168 11/11/2017   HDL 53.10 11/11/2017   LDLCALC 100 (H) 11/11/2017   LDLDIRECT 175.9 02/05/2012   TRIG 73.0 11/11/2017   CHOLHDL 3 11/11/2017    Significant Diagnostic Results in last 30 days:  No results found.  Assessment/Plan 1. Acquired hypothyroidism -continues synthroid, follow up TSH when blood work due next  2. Chronic idiopathic constipation -well controlled on current regimen.   3. Vitamin D deficiency -continues on supplement.   4. Mood disorder ([place]) Well controlled on remeron and effexor.   5. Other chronic pulmonary embolism without acute cor pulmonale (HCC) Stable, continues on eliquis twice daily, no signs of bruising or bleeding  6. Moderate dementia without behavioral disturbance, psychotic disturbance, mood disturbance, or anxiety, unspecified dementia type (HCC) -Stable, no acute changes in cognitive or functional status, continue supportive care by staff and family.   7. Loss of appetite.  Has steadily gained weight on supplement twice daily. She is eating well at thistime therefore will dc.wll cont to monitor weights.    Carlos American. [place], [facility] [phone]

## 2022-07-28 ENCOUNTER — Non-Acute Institutional Stay (SKILLED_NURSING_FACILITY): Payer: Medicare Other | Admitting: Student

## 2022-07-28 DIAGNOSIS — C7800 Secondary malignant neoplasm of unspecified lung: Secondary | ICD-10-CM

## 2022-07-28 DIAGNOSIS — R062 Wheezing: Secondary | ICD-10-CM | POA: Diagnosis not present

## 2022-07-28 DIAGNOSIS — F331 Major depressive disorder, recurrent, moderate: Secondary | ICD-10-CM | POA: Diagnosis not present

## 2022-07-28 DIAGNOSIS — D849 Immunodeficiency, unspecified: Secondary | ICD-10-CM | POA: Diagnosis not present

## 2022-07-28 LAB — CBC AND DIFFERENTIAL
HCT: 32 — AB (ref 36–46)
Hemoglobin: 11.5 — AB (ref 12.0–16.0)
Neutrophils Absolute: 5357
Platelets: 286 10*3/uL (ref 150–400)
WBC: 9.6

## 2022-07-28 LAB — CBC: RBC: 3.34 — AB (ref 3.87–5.11)

## 2022-07-28 NOTE — Progress Notes (Unsigned)
Location:  Other Nursing Home Room Number: [place] of Service:  SNF (31) Provider:  Theadore Nan, MD  Patient Care Team: Dewayne Shorter, MD as PCP - General (Family Medicine) Pyrtle, Lajuan Lines, MD as Consulting Physician (Gastroenterology) Telford Nab, RN as Registered Nurse Earlie Server, MD as Consulting Physician (Hematology and Oncology) Debbora Dus, [facility] as Pharmacist (Pharmacist) Burnice Logan, [facility] (Inactive) as Pharmacist (Pharmacist) Ralene Bathe, MD (Dermatology) Festus Aloe, MD as Consulting Physician (Urology)  Extended Emergency Contact Information Primary Emergency Contact: Gracelyn Nurse Address: [address]          [place], [place] [address] Johnnette Litter of [place] Phone: [phone] Mobile Phone: [phone] Relation: Spouse Secondary Emergency Contact: [place] Mobile Phone: [phone] Relation: Sister Preferred language: English  Code Status:  DNR Goals of care: Advanced Directive information    06/26/2022    2:52 PM  Advanced Directives  Does Patient Have a Medical Advance Directive? Yes  Type of Advance Directive Out of facility DNR (pink MOST or yellow form)  Does patient want to make changes to medical advance directive? No - Patient declined  Pre-existing out of facility DNR order (yellow form or pink MOST form) Yellow form placed in chart (order not valid for inpatient use)     Chief Complaint  Patient presents with   Wheezing    HPI:  Pt is a 82 y.o. female seen today for medical management of chronic diseases.  Acute concern for cough and wheezing.   Patient remains on Hospice with DNR status. Weight stable.   Patient has been coughing all weekend. Afebrile.  Past Medical History:  Diagnosis Date   AKI (acute kidney injury) ([place]) 08/17/2018   Anxiety    Arthritis    Belching    Bladder disorder    OVERACTIVE   Bowel dysfunction    BLOCKAGE   Cancer (HCC)    breast   Constipation     Depression    Diverticulitis    Fibromyalgia    GERD (gastroesophageal reflux disease)    Hyperlipidemia    IBS (irritable bowel syndrome)    Internal hemorrhoids    Lung cancer ([place]) 2020   Memory deficits    Murmur    asymptomatic   Pneumonia 11/18/12   Urinary incontinence    Vertigo    Past Surgical History:  Procedure Laterality Date   AXILLARY LYMPH NODE BIOPSY Left 06/08/2018   INVASIVE MAMMARY CARCINOMA   BLADDER SUSPENSION  2004, 2012   BREAST BIOPSY Left 06/08/2018   INVASIVE MAMMARY CARCINOMA   CATARACT EXTRACTION W/PHACO Right 08/27/2015   Procedure: CATARACT EXTRACTION PHACO AND INTRAOCULAR LENS PLACEMENT ([place]);  Surgeon: Estill Cotta, MD;  Location: [facility] ORS;  Service: Ophthalmology;  Laterality: Right;  [place]   1:00.2 AP%  22.5 CDE  23.67 fluid casette lot [id number]  exp05/31/2018   CATARACT EXTRACTION W/PHACO Left 10/15/2015   Procedure: CATARACT EXTRACTION PHACO AND INTRAOCULAR LENS PLACEMENT (I[facility]);  Surgeon: Estill Cotta, MD;  Location: [facility] ORS;  Service: Ophthalmology;  Laterality: Left;  [place] 01:07 AP% 18.1 CDE 21.57 fluid pack lot # :[id number]   CHOLECYSTECTOMY     COLONOSCOPY  2017   ELECTROMAGNETIC NAVIGATION BROCHOSCOPY N/A 07/09/2018   Procedure: ELECTROMAGNETIC NAVIGATION BRONCHOSCOPY;  Surgeon: Flora Lipps, MD;  Location: [facility] ORS;  Service: Cardiopulmonary;  Laterality: N/A;   TONSILLECTOMY  1947    Allergies  Allergen Reactions   Sulfa Antibiotics Rash    Outpatient Encounter Medications as of 07/28/2022  Medication Sig   acetaminophen (TYLENOL) 325 MG tablet Take 325 mg by mouth every 6 (six) hours as needed for mild pain.    alum & mag hydroxide-simeth (MAALOX/MYLANTA) 200-200-20 MG/5ML suspension Take 30 mLs by mouth every 6 (six) hours as needed for indigestion or heartburn.   apixaban (ELIQUIS) 5 MG TABS tablet Take 5 mg by mouth 2 (two) times daily.   fluticasone (FLONASE) 50 MCG/ACT nasal spray Place 2 sprays into both nostrils  daily.   Infant Care Products [facility]) OINT Apply to buttocks,Coccyx, Sacrum topically as needed for redness.   levothyroxine (SYNTHROID) 25 MCG tablet TAKE 1 TABLET BY MOUTH DAILY   mirtazapine (REMERON) 7.5 MG tablet Take 7.5 mg by mouth at bedtime.   morphine 20 MG/5ML solution Take 0.25 mLs by mouth every hour as needed for pain. As needed for medium level of pain and SOB   morphine 20 MG/5ML solution Take 0.5 mLs by mouth every 2 (two) hours as needed for pain. For high level of pain and SOB   Multiple Vitamin (MULTIVITAMIN WITH MINERALS) TABS tablet Take 1 tablet by mouth daily.   omeprazole (PRILOSEC) 40 MG capsule Take 40 mg by mouth daily.   ondansetron (ZOFRAN) 8 MG tablet TAKE ONE TABLET EVERY EIGHT HOURS AS NEEDED FOR NAUSEA / VOMITING   polyethylene glycol powder (MIRALAX) 17 GM/SCOOP powder Take 1 Container by mouth daily as needed.   sennosides-docusate sodium (SENOKOT-S) 8.6-50 MG tablet Take 2 tablets by mouth daily.   venlafaxine (EFFEXOR) 75 MG tablet Take 75 mg by mouth daily.   Vitamin D, Cholecalciferol, 25 MCG (1000 UT) TABS Take 1 tablet by mouth daily.   No facility-administered encounter medications on file as of 07/28/2022.    Review of Systems  Immunization History  Administered Date(s) Administered   Fluad Quad(high Dose 65+) 02/01/2019   Influenza Split 02/05/2012   Influenza, High Dose Seasonal PF 03/23/2018   Influenza,inj,Quad PF,6+ Mos 02/15/2015, 02/24/2017   Influenza-Unspecified 02/23/2013, 03/11/2022   Moderna Sars-Covid-2 Vaccination 08/05/2019, 09/17/2019   Pneumococcal Conjugate-13 12/13/2013   Pneumococcal Polysaccharide-23 07/18/2015   Tdap 08/17/2019   Unspecified SARS-COV-2 Vaccination 02/15/2021   Pertinent  Health Maintenance Due  Topic Date Due   INFLUENZA VACCINE  Completed   DEXA SCAN  Completed   COLONOSCOPY (Pts 45-67yr Insurance coverage will need to be confirmed)  Discontinued      02/09/2021    8:15 PM 02/10/2021    9:00  AM 02/10/2021    8:30 PM 02/11/2021    7:00 AM 02/20/2021    3:00 PM  Fall Risk  (RETIRED) Patient Fall Risk Level High fall risk High fall risk High fall risk High fall risk Low fall risk   Functional Status Survey:    Vitals:   07/28/22 0954  BP: 117/62  Pulse: 65  Resp: 18  Temp: (!) 97.2 F (36.2 C)  SpO2: 96%   There is no height or weight on file to calculate BMI. Physical Exam Cardiovascular:     Rate and Rhythm: Normal rate.     Pulses: Normal pulses.  Pulmonary:     Comments: Increased work of breathing. Wheezing in all lung fields.  Neurological:     Mental Status: She is alert and oriented to person, place, and time.     Labs reviewed: Recent Labs    03/10/22 0000  NA 140  K 4.2  CL 104  CO2 28*  BUN 33*  CREATININE 1.0  CALCIUM 9.1   Recent Labs  03/10/22 0000  AST 18  ALT 17  ALKPHOS 102  ALBUMIN 3.8   Recent Labs    03/10/22 0000  WBC 9.5  HGB 11.2*  HCT 32*  PLT 305   Lab Results  Component Value Date   TSH 1.607 02/08/2021   Lab Results  Component Value Date   HGBA1C 5.6 06/12/2014   Lab Results  Component Value Date   CHOL 168 11/11/2017   HDL 53.10 11/11/2017   LDLCALC 100 (H) 11/11/2017   LDLDIRECT 175.9 02/05/2012   TRIG 73.0 11/11/2017   CHOLHDL 3 11/11/2017    Significant Diagnostic Results in last 30 days:  No results found.  Assessment/Plan Wheezing  Malignant neoplasm metastatic to lung, unspecified laterality (HCC)  Immunocompromised (HCC)  Major depressive disorder, recurrent, moderate ([place]), Chronic Patient seen today for acute/routine visit. Found to have wheezing. Plan to schedule duonebs q6 hours PRN for wheezing. COVID, RVP, CBC, BMP, CXR ordered. Plan to remain quarantined until results have finalized from these tests. Supportive care. Patient remains under hospice care. Patient has a DNR order. Continue comfort medications as indicated. Continue synthroid, effexor, and apixiban for chronic  conditions - hypothyroid, depression, and a fib respectively.   Family/ staff Communication: spouse, nursing  Labs/tests ordered:  RVP, CBC, BMP, CXR

## 2022-07-29 ENCOUNTER — Encounter: Payer: Self-pay | Admitting: Student

## 2022-07-29 DIAGNOSIS — C7800 Secondary malignant neoplasm of unspecified lung: Secondary | ICD-10-CM | POA: Insufficient documentation

## 2022-07-29 DIAGNOSIS — D849 Immunodeficiency, unspecified: Secondary | ICD-10-CM | POA: Insufficient documentation

## 2022-09-01 ENCOUNTER — Encounter: Payer: Self-pay | Admitting: Student

## 2022-09-01 ENCOUNTER — Non-Acute Institutional Stay (SKILLED_NURSING_FACILITY): Payer: Medicare Other | Admitting: Student

## 2022-09-01 DIAGNOSIS — F331 Major depressive disorder, recurrent, moderate: Secondary | ICD-10-CM | POA: Diagnosis not present

## 2022-09-01 DIAGNOSIS — K219 Gastro-esophageal reflux disease without esophagitis: Secondary | ICD-10-CM | POA: Diagnosis not present

## 2022-09-01 DIAGNOSIS — Z66 Do not resuscitate: Secondary | ICD-10-CM

## 2022-09-01 DIAGNOSIS — I350 Nonrheumatic aortic (valve) stenosis: Secondary | ICD-10-CM

## 2022-09-01 DIAGNOSIS — E039 Hypothyroidism, unspecified: Secondary | ICD-10-CM | POA: Diagnosis not present

## 2022-09-01 DIAGNOSIS — R635 Abnormal weight gain: Secondary | ICD-10-CM

## 2022-09-01 NOTE — Progress Notes (Signed)
Location:  Other [facility].  Nursing Home Room Number: [facility] [address] Place of Service:  SNF [phone]) Provider:  Earnestine Mealing, MD  Patient Care Team: Earnestine Mealing, MD as PCP - General (Family Medicine) Pyrtle, Carie Caddy, MD as Consulting Physician (Gastroenterology) Glory Buff, RN as Registered Nurse Rickard Patience, MD as Consulting Physician (Hematology and Oncology) Phil Dopp, [facility] as Pharmacist (Pharmacist) Earvin Hansen, [facility] (Inactive) as Pharmacist (Pharmacist) Deirdre Evener, MD (Dermatology) Jerilee Field, MD as Consulting Physician (Urology)  Extended Emergency Contact Information Primary Emergency Contact: Vesta Mixer Address: [address]          [place], [place] [address] D[place] of [place] Home Phone: [phone] Mobile Phone: [phone] Relation: Spouse Secondary Emergency Contact: Rindfuss,Ellen Mobile Phone: [phone] Relation: Sister Preferred language: English  Code Status:  DNR Goals of care: Advanced Directive information    09/01/2022   11:03 AM  Advanced Directives  Does Patient Have a Medical Advance Directive? Yes  Type of Advance Directive Out of facility DNR (pink MOST or yellow form)  Does patient want to make changes to medical advance directive? No - Patient declined     Chief Complaint  Patient presents with   Medical Management of Chronic Issues    Medical Management of Chronic Issues.     HPI:  Pt is a 82 y.o. female seen today for medical management of chronic diseases.    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 She is feeling more tired.  Appetite is good. She denies chest pain, shortness of breath. No leg swelling.   She is dependent  for ADLs and on hospice.   She states today is Wednesday and she can't give the year. When told, she was able to state that her birthday is tomorrow.   Spoke with patient's spouse regarding   Past Medical History:  Diagnosis Date   AKI (acute kidney injury) 08/17/2018    Anxiety    Arthritis    Belching    Bladder disorder    OVERACTIVE   Bowel dysfunction    BLOCKAGE   Cancer    breast   Constipation    Depression    Diverticulitis    Fibromyalgia    GERD (gastroesophageal reflux disease)    Hyperlipidemia    IBS (irritable bowel syndrome)    Internal hemorrhoids    Lung cancer 2020   Memory deficits    Murmur    asymptomatic   Pneumonia 11/18/12   Urinary incontinence    Vertigo    Past Surgical History:  Procedure Laterality Date   AXILLARY LYMPH NODE BIOPSY Left 06/08/2018   INVASIVE MAMMARY CARCINOMA   BLADDER SUSPENSION  2004, 2012   BREAST BIOPSY Left 06/08/2018   INVASIVE MAMMARY CARCINOMA   CATARACT EXTRACTION W/PHACO Right 08/27/2015   Procedure: CATARACT EXTRACTION PHACO AND INTRAOCULAR LENS PLACEMENT (IOC);  Surgeon: Sallee Lange, MD;  Location: [facility] ORS;  Service: Ophthalmology;  Laterality: Right;  [place]   1:00.2 AP%  22.5 CDE  23.67 fluid casette lot #[id number]  exp05/31/2018   CATARACT EXTRACTION W/PHACO Left 10/15/2015   Procedure: CATARACT EXTRACTION PHACO AND INTRAOCULAR LENS PLACEMENT (IOC);  Surgeon: Sallee Lange, MD;  Location: [facility] ORS;  Service: Ophthalmology;  Laterality: Left;  [place] 01:07 AP% 18.1 CDE 21.57 fluid pack lot # [id number]   CHOLECYSTECTOMY     COLONOSCOPY  2017   ELECTROMAGNETIC NAVIGATION BROCHOSCOPY N/A 07/09/2018   Procedure: ELECTROMAGNETIC NAVIGATION BRONCHOSCOPY;  Surgeon: Erin Fulling, MD;  Location: [facility]  ORS;  Service: Cardiopulmonary;  Laterality: N/A;   TONSILLECTOMY  1947    Allergies  Allergen Reactions   Sulfa Antibiotics Rash    Outpatient Encounter Medications as of 09/01/2022  Medication Sig   acetaminophen (TYLENOL) 325 MG tablet Take 325 mg by mouth every 6 (six) hours as needed for mild pain.    alum & mag hydroxide-simeth (MAALOX/MYLANTA) 200-200-20 MG/5ML suspension Take 30 mLs by mouth every 6 (six) hours as needed for indigestion or heartburn.   apixaban (ELIQUIS) 5  MG TABS tablet Take 5 mg by mouth 2 (two) times daily.   Infant Care Products [facility]) OINT Apply to buttocks,Coccyx, Sacrum topically as needed for redness.   ipratropium-albuterol (DUONEB) 0.5-2.5 (3) MG/3ML SOLN Take 3 mLs by nebulization every 6 (six) hours as needed.   levothyroxine (SYNTHROID) 25 MCG tablet TAKE 1 TABLET BY MOUTH DAILY   mirtazapine (REMERON) 7.5 MG tablet Take 7.5 mg by mouth at bedtime.   morphine 20 MG/5ML solution Take 0.25 mLs by mouth every hour as needed for pain. As needed for medium level of pain and SOB   morphine 20 MG/5ML solution Take 0.5 mLs by mouth every hour as needed for pain. For high level of pain and SOB   Multiple Vitamin (MULTIVITAMIN WITH MINERALS) TABS tablet Take 1 tablet by mouth daily.   omeprazole (PRILOSEC) 40 MG capsule Take 40 mg by mouth daily.   ondansetron (ZOFRAN) 8 MG tablet TAKE ONE TABLET EVERY EIGHT HOURS AS NEEDED FOR NAUSEA / VOMITING   polyethylene glycol powder (MIRALAX) 17 GM/SCOOP powder Take 1 Container by mouth daily as needed.   sennosides-docusate sodium (SENOKOT-S) 8.6-50 MG tablet Take 2 tablets by mouth daily.   venlafaxine (EFFEXOR) 75 MG tablet Take 75 mg by mouth daily.   Vitamin D, Cholecalciferol, 25 MCG (1000 UT) TABS Take 1 tablet by mouth daily.   [DISCONTINUED] fluticasone (FLONASE) 50 MCG/ACT nasal spray Place 2 sprays into both nostrils daily.   No facility-administered encounter medications on file as of 09/01/2022.    Review of Systems  Immunization History  Administered Date(s) Administered   Fluad Quad(high Dose 65+) 02/01/2019   Influenza Split 02/05/2012   Influenza, High Dose Seasonal PF 03/23/2018   Influenza,inj,Quad PF,6+ Mos 02/15/2015, 02/24/2017   Influenza-Unspecified 02/23/2013, 03/11/2022   Moderna Sars-Covid-2 Vaccination 08/05/2019, 09/17/2019   Pneumococcal Conjugate-13 12/13/2013   Pneumococcal Polysaccharide-23 07/18/2015   Tdap 08/17/2019   Unspecified SARS-COV-2 Vaccination  02/15/2021   Pertinent  Health Maintenance Due  Topic Date Due   INFLUENZA VACCINE  12/25/2022   DEXA SCAN  Completed   COLONOSCOPY (Pts 45-2yrs Insurance coverage will need to be confirmed)  Discontinued      02/09/2021    8:15 PM 02/10/2021    9:00 AM 02/10/2021    8:30 PM 02/11/2021    7:00 AM 02/20/2021    3:00 PM  Fall Risk  (RETIRED) Patient Fall Risk Level High fall risk High fall risk High fall risk High fall risk Low fall risk   Functional Status Survey:    Vitals:   09/01/22 1055  BP: 115/67  Pulse: 61  Resp: 16  Temp: (!) 96.7 F (35.9 C)  SpO2: 95%  Weight: 173 lb 6.4 oz (78.7 kg)  Height: 5\' 2"  (1.575 m)   Body mass index is 31.72 kg/m. Physical Exam Vitals reviewed.  Constitutional:      Comments: Chronically ill appearing  Cardiovascular:     Rate and Rhythm: Normal rate and regular rhythm.  Pulses: Normal pulses.  Pulmonary:     Effort: Pulmonary effort is normal.  Abdominal:     General: Abdomen is flat.     Palpations: Abdomen is soft.  Neurological:     Mental Status: She is alert. Mental status is at baseline.     Comments: A&O 1     Labs reviewed: Recent Labs    03/10/22 0000  NA 140  K 4.2  CL 104  CO2 28*  BUN 33*  CREATININE 1.0  CALCIUM 9.1   Recent Labs    03/10/22 0000  AST 18  ALT 17  ALKPHOS 102  ALBUMIN 3.8   Recent Labs    03/10/22 0000 07/28/22 0000  WBC 9.5 9.6  NEUTROABS  --  5,357.00  HGB 11.2* 11.5*  HCT 32* 32*  PLT 305 286   Lab Results  Component Value Date   TSH 1.607 02/08/2021   Lab Results  Component Value Date   HGBA1C 5.6 06/12/2014   Lab Results  Component Value Date   CHOL 168 11/11/2017   HDL 53.10 11/11/2017   LDLCALC 100 (H) 11/11/2017   LDLDIRECT 175.9 02/05/2012   TRIG 73.0 11/11/2017   CHOLHDL 3 11/11/2017    Significant Diagnostic Results in last 30 days:  No results found.  Assessment/Plan 1. Major depressive disorder, recurrent, moderate Mood stable with  venlafaxine 75 mg daily.   2. Acquired hypothyroidism Continue current dose of levothyroxine 25 mc daily. Consider rechecking Tsh if weight continues to uptrend with discontinuing appetite stimulant.   3. Gastroesophageal reflux disease, unspecified whether esophagitis present Symptoms well-controlled with prilosec 40 continue at this time.   4. Aortic valve stenosis, etiology of cardiac valve disease unspecified No symptoms. Continue eliquis 5 mg BID. Continue to monitor for symptoms. Patient appears euvolemic at this time, however, given   5. Weight gain Patient has had significant weight gain in the last year 40 lbs. Plan to wean patient off of this medication for appetite stimulant. Change mirtazapine to every other day for 2 weeks then discontinue.   6. DNR Patient is on hospice and maintains DNR order.  Family/ staff Communication: Spouse, nursing  Labs/tests ordered:  none

## 2022-10-06 ENCOUNTER — Encounter: Payer: Self-pay | Admitting: Oncology

## 2022-10-30 ENCOUNTER — Non-Acute Institutional Stay: Payer: Medicare Other | Admitting: Nurse Practitioner

## 2022-10-30 ENCOUNTER — Encounter: Payer: Self-pay | Admitting: Nurse Practitioner

## 2022-10-30 DIAGNOSIS — I2782 Chronic pulmonary embolism: Secondary | ICD-10-CM

## 2022-10-30 DIAGNOSIS — C7800 Secondary malignant neoplasm of unspecified lung: Secondary | ICD-10-CM

## 2022-10-30 DIAGNOSIS — F331 Major depressive disorder, recurrent, moderate: Secondary | ICD-10-CM | POA: Diagnosis not present

## 2022-10-30 DIAGNOSIS — K5904 Chronic idiopathic constipation: Secondary | ICD-10-CM

## 2022-10-30 DIAGNOSIS — E039 Hypothyroidism, unspecified: Secondary | ICD-10-CM | POA: Diagnosis not present

## 2022-10-30 DIAGNOSIS — I350 Nonrheumatic aortic (valve) stenosis: Secondary | ICD-10-CM

## 2022-10-30 DIAGNOSIS — F03B0 Unspecified dementia, moderate, without behavioral disturbance, psychotic disturbance, mood disturbance, and anxiety: Secondary | ICD-10-CM

## 2022-10-30 DIAGNOSIS — K219 Gastro-esophageal reflux disease without esophagitis: Secondary | ICD-10-CM | POA: Diagnosis not present

## 2022-10-30 NOTE — Progress Notes (Signed)
Location:  Other [facility].  Nursing Home Room Number: [facility] [address] Place of Service:  SNF [phone]) Abbey Chatters, NP  PCP: Earnestine Mealing, MD  Patient Care Team: Earnestine Mealing, MD as PCP - General (Family Medicine) Pyrtle, Carie Caddy, MD as Consulting Physician (Gastroenterology) Glory Buff, RN as Registered Nurse Rickard Patience, MD as Consulting Physician (Hematology and Oncology) Vilinda Flake, [facility] as Pharmacist (Pharmacist) Earvin Hansen, [facility] (Inactive) as Pharmacist (Pharmacist) Deirdre Evener, MD (Dermatology) Jerilee Field, MD as Consulting Physician (Urology)  Extended Emergency Contact Information Primary Emergency Contact: Vesta Mixer Address: [address]          [place], [place] [address] [place] of [place] Home Phone: [phone] Mobile Phone: [phone] Relation: Spouse Secondary Emergency Contact: Rindfuss,Ellen Mobile Phone: [phone] Relation: Sister Preferred language: English  Goals of care: Advanced Directive information    10/30/2022    9:20 AM  Advanced Directives  Does Patient Have a Medical Advance Directive? Yes  Type of Advance Directive Out of facility DNR (pink MOST or yellow form)  Does patient want to make changes to medical advance directive? No - Patient declined     Chief Complaint  Patient presents with   Medical Management of Chronic Issues    Medical Management of Chronic Issues.     HPI:  Pt is a 82 y.o. female seen today for medical management of chronic disease.  Pt with hx of dementia, anxiety, depression, lung and breast cancer, hypothryoid, chronic PE. Her husband visits her daily.  She has had not had any acute changes per staff. Still eats well and feeds herself.  Weight has been stable. No reports on increase pain She is followed by hospice.    Past Medical History:  Diagnosis Date   AKI (acute kidney injury) (HCC) 08/17/2018   Anxiety    Arthritis    Belching    Bladder disorder     OVERACTIVE   Bowel dysfunction    BLOCKAGE   Cancer (HCC)    breast   Constipation    Depression    Diverticulitis    Fibromyalgia    GERD (gastroesophageal reflux disease)    Hyperlipidemia    IBS (irritable bowel syndrome)    Internal hemorrhoids    Lung cancer (HCC) 2020   Memory deficits    Murmur    asymptomatic   Pneumonia 11/18/12   Urinary incontinence    Vertigo    Past Surgical History:  Procedure Laterality Date   AXILLARY LYMPH NODE BIOPSY Left 06/08/2018   INVASIVE MAMMARY CARCINOMA   BLADDER SUSPENSION  2004, 2012   BREAST BIOPSY Left 06/08/2018   INVASIVE MAMMARY CARCINOMA   CATARACT EXTRACTION W/PHACO Right 08/27/2015   Procedure: CATARACT EXTRACTION PHACO AND INTRAOCULAR LENS PLACEMENT (IOC);  Surgeon: Sallee Lange, MD;  Location: [facility] ORS;  Service: Ophthalmology;  Laterality: Right;  [place]   1:00.2 AP%  22.5 CDE  23.67 fluid casette lot #[id number]  exp05/31/2018   CATARACT EXTRACTION W/PHACO Left 10/15/2015   Procedure: CATARACT EXTRACTION PHACO AND INTRAOCULAR LENS PLACEMENT (IOC);  Surgeon: Sallee Lange, MD;  Location: [facility] ORS;  Service: Ophthalmology;  Laterality: Left;  [place] 01:07 AP% 18.1 CDE 21.57 fluid pack lot # [id number]   CHOLECYSTECTOMY     COLONOSCOPY  2017   ELECTROMAGNETIC NAVIGATION BROCHOSCOPY N/A 07/09/2018   Procedure: ELECTROMAGNETIC NAVIGATION BRONCHOSCOPY;  Surgeon: Erin Fulling, MD;  Location: [facility] ORS;  Service: Cardiopulmonary;  Laterality: N/A;   TONSILLECTOMY  1947  Allergies  Allergen Reactions   Sulfa Antibiotics Rash    Outpatient Encounter Medications as of 10/30/2022  Medication Sig   acetaminophen (TYLENOL) 325 MG tablet Take 325 mg by mouth every 6 (six) hours as needed for mild pain.    alum & mag hydroxide-simeth (MAALOX/MYLANTA) 200-200-20 MG/5ML suspension Take 30 mLs by mouth every 6 (six) hours as needed for indigestion or heartburn.   apixaban (ELIQUIS) 5 MG TABS tablet Take 5 mg by mouth 2 (two) times  daily.   Infant Care Products [facility]) OINT Apply to buttocks,Coccyx, Sacrum topically as needed for redness.   ipratropium-albuterol (DUONEB) 0.5-2.5 (3) MG/3ML SOLN Take 3 mLs by nebulization every 6 (six) hours as needed.   levothyroxine (SYNTHROID) 25 MCG tablet TAKE 1 TABLET BY MOUTH DAILY   mirtazapine (REMERON) 7.5 MG tablet Take 7.5 mg by mouth at bedtime.   morphine 20 MG/5ML solution Take 0.25 mLs by mouth every hour as needed for pain. As needed for medium level of pain and SOB   morphine 20 MG/5ML solution Take 0.5 mLs by mouth every hour as needed for pain. For high level of pain and SOB   Multiple Vitamin (MULTIVITAMIN WITH MINERALS) TABS tablet Take 1 tablet by mouth daily.   omeprazole (PRILOSEC) 40 MG capsule Take 40 mg by mouth daily.   ondansetron (ZOFRAN) 8 MG tablet TAKE ONE TABLET EVERY EIGHT HOURS AS NEEDED FOR NAUSEA / VOMITING   polyethylene glycol powder (MIRALAX) 17 GM/SCOOP powder Take 1 Container by mouth daily as needed.   sennosides-docusate sodium (SENOKOT-S) 8.6-50 MG tablet Take 2 tablets by mouth daily.   venlafaxine (EFFEXOR) 75 MG tablet Take 75 mg by mouth daily.   Vitamin D, Cholecalciferol, 25 MCG (1000 UT) TABS Take 1 tablet by mouth daily.   Zinc Oxide (TRIPLE PASTE) 12.8 % ointment Apply 1 Application topically as needed for irritation.   No facility-administered encounter medications on file as of 10/30/2022.    Review of Systems  Unable to perform ROS: Dementia     Immunization History  Administered Date(s) Administered   Fluad Quad(high Dose 65+) 02/01/2019   Influenza Split 02/05/2012   Influenza, High Dose Seasonal PF 03/23/2018   Influenza,inj,Quad PF,6+ Mos 02/15/2015, 02/24/2017   Influenza-Unspecified 02/23/2013, 03/11/2022   Moderna Sars-Covid-2 Vaccination 08/05/2019, 09/17/2019   Pneumococcal Conjugate-13 12/13/2013   Pneumococcal Polysaccharide-23 07/18/2015   Tdap 08/17/2019   Unspecified SARS-COV-2 Vaccination 02/15/2021    Pertinent  Health Maintenance Due  Topic Date Due   INFLUENZA VACCINE  12/25/2022   DEXA SCAN  Completed   Colonoscopy  Discontinued      02/09/2021    8:15 PM 02/10/2021    9:00 AM 02/10/2021    8:30 PM 02/11/2021    7:00 AM 02/20/2021    3:00 PM  Fall Risk  (RETIRED) Patient Fall Risk Level High fall risk High fall risk High fall risk High fall risk Low fall risk   Functional Status Survey:    Vitals:   10/30/22 1336  BP: 104/62  Pulse: 66  Resp: 20  SpO2: 94%   There is no height or weight on file to calculate BMI. Physical Exam Constitutional:      General: She is not in acute distress.    Appearance: She is well-developed. She is not diaphoretic.  HENT:     Head: Normocephalic and atraumatic.     Mouth/Throat:     Pharynx: No oropharyngeal exudate.  Eyes:     Conjunctiva/sclera: Conjunctivae normal.  Pupils: Pupils are equal, round, and reactive to light.  Cardiovascular:     Rate and Rhythm: Normal rate and regular rhythm.     Heart sounds: Murmur heard.  Pulmonary:     Effort: Pulmonary effort is normal.     Breath sounds: Normal breath sounds.  Abdominal:     General: Bowel sounds are normal.     Palpations: Abdomen is soft.  Musculoskeletal:     Cervical back: Normal range of motion and neck supple.     Right lower leg: No edema.     Left lower leg: No edema.  Skin:    General: Skin is warm and dry.  Neurological:     Mental Status: She is alert.  Psychiatric:        Mood and Affect: Mood normal.     Labs reviewed: Recent Labs    03/10/22 0000  NA 140  K 4.2  CL 104  CO2 28*  BUN 33*  CREATININE 1.0  CALCIUM 9.1   Recent Labs    03/10/22 0000  AST 18  ALT 17  ALKPHOS 102  ALBUMIN 3.8   Recent Labs    03/10/22 0000 07/28/22 0000  WBC 9.5 9.6  NEUTROABS  --  5,357.00  HGB 11.2* 11.5*  HCT 32* 32*  PLT 305 286   Lab Results  Component Value Date   TSH 1.607 02/08/2021   Lab Results  Component Value Date   HGBA1C  5.6 06/12/2014   Lab Results  Component Value Date   CHOL 168 11/11/2017   HDL 53.10 11/11/2017   LDLCALC 100 (H) 11/11/2017   LDLDIRECT 175.9 02/05/2012   TRIG 73.0 11/11/2017   CHOLHDL 3 11/11/2017    Significant Diagnostic Results in last 30 days:  No results found.  Assessment/Plan 1. Major depressive disorder, recurrent, moderate (HCC) Controlled on effexor and remeron  2. Chronic idiopathic constipation -stable on current regimen, uses miralax daily  3. Gastroesophageal reflux disease, unspecified whether esophagitis present Stable on omeprazole. No compliants of GERD  4. Acquired hypothyroidism -continues on synthroid 25 mcg, will montior  5. Malignant neoplasm metastatic to lung, unspecified laterality (HCC) Stable, without symptoms at [facility] stime, continues on hospice   6. Other chronic pulmonary embolism without acute cor pulmonale (HCC) -stable currently on eliquis 5 mg daily  7. Aortic valve stenosis, etiology of cardiac valve disease unspecified -stable, without worsening LE edema, shortness of breath or chest pains.   8. Moderate dementia without behavioral disturbance, psychotic disturbance, mood disturbance, or anxiety, unspecified dementia type (HCC) Stable, no acute changes in cognitive or functional status, continue supportive care.    Janene Harvey. Biagio Borg [facility] [phone]

## 2022-11-13 ENCOUNTER — Encounter: Payer: Self-pay | Admitting: Oncology

## 2023-01-15 ENCOUNTER — Non-Acute Institutional Stay (SKILLED_NURSING_FACILITY): Payer: Medicare Other | Admitting: Nurse Practitioner

## 2023-01-15 ENCOUNTER — Encounter: Payer: Self-pay | Admitting: Nurse Practitioner

## 2023-01-15 DIAGNOSIS — E039 Hypothyroidism, unspecified: Secondary | ICD-10-CM

## 2023-01-15 DIAGNOSIS — I350 Nonrheumatic aortic (valve) stenosis: Secondary | ICD-10-CM

## 2023-01-15 DIAGNOSIS — K219 Gastro-esophageal reflux disease without esophagitis: Secondary | ICD-10-CM | POA: Diagnosis not present

## 2023-01-15 DIAGNOSIS — F03B0 Unspecified dementia, moderate, without behavioral disturbance, psychotic disturbance, mood disturbance, and anxiety: Secondary | ICD-10-CM

## 2023-01-15 DIAGNOSIS — I2782 Chronic pulmonary embolism: Secondary | ICD-10-CM

## 2023-01-15 DIAGNOSIS — F331 Major depressive disorder, recurrent, moderate: Secondary | ICD-10-CM

## 2023-01-15 DIAGNOSIS — C7800 Secondary malignant neoplasm of unspecified lung: Secondary | ICD-10-CM

## 2023-01-15 NOTE — Progress Notes (Signed)
Location:  Other [place].  Nursing Home Room Number: [facility] [address] Place of Service:  SNF [phone]) Abbey Chatters, NP  PCP: Earnestine Mealing, MD  Patient Care Team: Earnestine Mealing, MD as PCP - General (Family Medicine) Pyrtle, Carie Caddy, MD as Consulting Physician (Gastroenterology) Glory Buff, RN as Registered Nurse Rickard Patience, MD as Consulting Physician (Hematology and Oncology) Vilinda Flake, [facility] (Inactive) as Pharmacist (Pharmacist) Earvin Hansen, [facility] (Inactive) as Pharmacist (Pharmacist) Deirdre Evener, MD (Dermatology) Jerilee Field, MD as Consulting Physician (Urology)  Extended Emergency Contact Information Primary Emergency Contact: Vesta Mixer Address: [address]          [place], [place] [address] [place] of [place] Home Phone: [phone] Mobile Phone: [phone] Relation: Spouse Secondary Emergency Contact: Rindfuss,Ellen Mobile Phone: [phone] Relation: Sister Preferred language: English  Goals of care: Advanced Directive information    01/15/2023    2:58 PM  Advanced Directives  Does Patient Have a Medical Advance Directive? Yes  Type of Advance Directive Out of facility DNR (pink MOST or yellow form)  Does patient want to make changes to medical advance directive? No - Patient declined     Chief Complaint  Patient presents with   Medical Management of Chronic Issues    Medical Management of Chronic Issues.     HPI:  Pt is a 82 y.o. female seen today for medical management of chronic disease. Pt at [facility] for long term care.  Pt continues under hospice. Staff does not have any new concerns.  Husband visits routinely from [place].  Pt denies pain or discomfort.  Weight has been stable actually with increase in weight over the last few months.  No reports of shortness of breath or chest pains.    Past Medical History:  Diagnosis Date   AKI (acute kidney injury) (HCC) 08/17/2018   Anxiety    Arthritis     Belching    Bladder disorder    OVERACTIVE   Bowel dysfunction    BLOCKAGE   Cancer (HCC)    breast   Constipation    Depression    Diverticulitis    Fibromyalgia    GERD (gastroesophageal reflux disease)    Hyperlipidemia    IBS (irritable bowel syndrome)    Internal hemorrhoids    Lung cancer (HCC) 2020   Memory deficits    Murmur    asymptomatic   Pneumonia 11/18/12   Urinary incontinence    Vertigo    Past Surgical History:  Procedure Laterality Date   AXILLARY LYMPH NODE BIOPSY Left 06/08/2018   INVASIVE MAMMARY CARCINOMA   BLADDER SUSPENSION  2004, 2012   BREAST BIOPSY Left 06/08/2018   INVASIVE MAMMARY CARCINOMA   CATARACT EXTRACTION W/PHACO Right 08/27/2015   Procedure: CATARACT EXTRACTION PHACO AND INTRAOCULAR LENS PLACEMENT (IOC);  Surgeon: Sallee Lange, MD;  Location: [facility] ORS;  Service: Ophthalmology;  Laterality: Right;  [place]   1:00.2 AP%  22.5 CDE  23.67 fluid casette lot #[id number]  exp05/31/2018   CATARACT EXTRACTION W/PHACO Left 10/15/2015   Procedure: CATARACT EXTRACTION PHACO AND INTRAOCULAR LENS PLACEMENT (IOC);  Surgeon: Sallee Lange, MD;  Location: [facility] ORS;  Service: Ophthalmology;  Laterality: Left;  [place] 01:07 AP% 18.1 CDE 21.57 fluid pack lot # [id number]   CHOLECYSTECTOMY     COLONOSCOPY  2017   ELECTROMAGNETIC NAVIGATION BROCHOSCOPY N/A 07/09/2018   Procedure: ELECTROMAGNETIC NAVIGATION BRONCHOSCOPY;  Surgeon: Erin Fulling, MD;  Location: [facility] ORS;  Service: Cardiopulmonary;  Laterality: N/A;  TONSILLECTOMY  1947    Allergies  Allergen Reactions   Sulfa Antibiotics Rash    Outpatient Encounter Medications as of 01/15/2023  Medication Sig   acetaminophen (TYLENOL) 325 MG tablet Take 325 mg by mouth every 6 (six) hours as needed for mild pain.    alum & mag hydroxide-simeth (MAALOX/MYLANTA) 200-200-20 MG/5ML suspension Take 30 mLs by mouth every 6 (six) hours as needed for indigestion or heartburn.   apixaban (ELIQUIS) 5 MG TABS tablet  Take 5 mg by mouth 2 (two) times daily.   Infant Care Products [facility]) OINT Apply to buttocks,Coccyx, Sacrum topically as needed for redness.   ipratropium-albuterol (DUONEB) 0.5-2.5 (3) MG/3ML SOLN Take 3 mLs by nebulization every 6 (six) hours as needed.   levothyroxine (SYNTHROID) 25 MCG tablet TAKE 1 TABLET BY MOUTH DAILY   morphine 20 MG/5ML solution Take 0.25 mLs by mouth every hour as needed for pain. As needed for medium level of pain and SOB   morphine 20 MG/5ML solution Take 0.5 mLs by mouth every hour as needed for pain. For high level of pain and SOB   Multiple Vitamin (MULTIVITAMIN WITH MINERALS) TABS tablet Take 1 tablet by mouth daily.   omeprazole (PRILOSEC) 40 MG capsule Take 40 mg by mouth daily.   ondansetron (ZOFRAN) 8 MG tablet TAKE ONE TABLET EVERY EIGHT HOURS AS NEEDED FOR NAUSEA / VOMITING   polyethylene glycol powder (MIRALAX) 17 GM/SCOOP powder Take 1 Container by mouth daily as needed.   sennosides-docusate sodium (SENOKOT-S) 8.6-50 MG tablet Take 2 tablets by mouth daily.   venlafaxine (EFFEXOR) 75 MG tablet Take 75 mg by mouth daily.   Vitamin D, Cholecalciferol, 25 MCG (1000 UT) TABS Take 1 tablet by mouth daily.   Zinc Oxide (TRIPLE PASTE) 12.8 % ointment Apply 1 Application topically as needed for irritation.   [DISCONTINUED] mirtazapine (REMERON) 7.5 MG tablet Take 7.5 mg by mouth at bedtime.   No facility-administered encounter medications on file as of 01/15/2023.    Review of Systems  Unable to perform ROS: Dementia     Immunization History  Administered Date(s) Administered   Fluad Quad(high Dose 65+) 02/01/2019   Influenza Split 02/05/2012   Influenza, High Dose Seasonal PF 03/23/2018   Influenza,inj,Quad PF,6+ Mos 02/15/2015, 02/24/2017   Influenza-Unspecified 02/23/2013, 03/11/2022   Moderna Sars-Covid-2 Vaccination 08/05/2019, 09/17/2019   Pneumococcal Conjugate-13 12/13/2013   Pneumococcal Polysaccharide-23 07/18/2015   Tdap 08/17/2019    Unspecified SARS-COV-2 Vaccination 02/15/2021   Pertinent  Health Maintenance Due  Topic Date Due   INFLUENZA VACCINE  12/25/2022   DEXA SCAN  Completed   Colonoscopy  Discontinued      02/09/2021    8:15 PM 02/10/2021    9:00 AM 02/10/2021    8:30 PM 02/11/2021    7:00 AM 02/20/2021    3:00 PM  Fall Risk  (RETIRED) Patient Fall Risk Level High fall risk High fall risk High fall risk High fall risk Low fall risk   Functional Status Survey:    Vitals:   01/15/23 1453  BP: 115/65  Pulse: 68  Resp: 17  Temp: (!) 97.5 F (36.4 C)  SpO2: 96%  Weight: 179 lb 6.4 oz (81.4 kg)  Height: 5\' 2"  (1.575 m)   Body mass index is 32.81 kg/m. Physical Exam Constitutional:      General: She is not in acute distress.    Appearance: She is well-developed. She is not diaphoretic.  HENT:     Head: Normocephalic and atraumatic.  Mouth/Throat:     Pharynx: No oropharyngeal exudate.  Eyes:     Conjunctiva/sclera: Conjunctivae normal.     Pupils: Pupils are equal, round, and reactive to light.  Cardiovascular:     Rate and Rhythm: Normal rate and regular rhythm.     Heart sounds: Normal heart sounds.  Pulmonary:     Effort: Pulmonary effort is normal.     Breath sounds: Normal breath sounds.  Abdominal:     General: Bowel sounds are normal.     Palpations: Abdomen is soft.  Musculoskeletal:     Cervical back: Normal range of motion and neck supple.     Right lower leg: No edema.     Left lower leg: No edema.  Skin:    General: Skin is warm and dry.  Neurological:     Mental Status: She is alert.     Motor: Weakness present.     Gait: Gait abnormal.  Psychiatric:        Mood and Affect: Mood normal.     Labs reviewed: Recent Labs    03/10/22 0000  NA 140  K 4.2  CL 104  CO2 28*  BUN 33*  CREATININE 1.0  CALCIUM 9.1   Recent Labs    03/10/22 0000  AST 18  ALT 17  ALKPHOS 102  ALBUMIN 3.8   Recent Labs    03/10/22 0000 07/28/22 0000  WBC 9.5 9.6   NEUTROABS  --  5,357.00  HGB 11.2* 11.5*  HCT 32* 32*  PLT 305 286   Lab Results  Component Value Date   TSH 1.607 02/08/2021   Lab Results  Component Value Date   HGBA1C 5.6 06/12/2014   Lab Results  Component Value Date   CHOL 168 11/11/2017   HDL 53.10 11/11/2017   LDLCALC 100 (H) 11/11/2017   LDLDIRECT 175.9 02/05/2012   TRIG 73.0 11/11/2017   CHOLHDL 3 11/11/2017    Significant Diagnostic Results in last 30 days:  No results found.  Assessment/Plan 1. Gastroesophageal reflux disease, unspecified whether esophagitis present Well controlled at this time Continues on omeprazole.   2. Acquired hypothyroidism Will follow up TSH Continues on synthroid 25 mcg  3. Malignant neoplasm metastatic to lung, unspecified laterality (HCC) Stable, continue comfort care  4. Other chronic pulmonary embolism without acute cor pulmonale (HCC) -no signs of progression, continues on eliquis for anticoagulation  5. Aortic valve stenosis, etiology of cardiac valve disease unspecified Stable, no chest pains or shortness of breath.  6. Moderate dementia without behavioral disturbance, psychotic disturbance, mood disturbance, or anxiety, unspecified dementia type (HCC) Advanced, no acute changes in cognitive or functional status, continue supportive care.   7. Major depressive disorder, recurrent, moderate (HCC) Controlled, no longer on medication   Jamie Matthews K. Biagio Borg [facility] [phone]

## 2023-01-19 LAB — HEPATIC FUNCTION PANEL
ALT: 15 U/L (ref 7–35)
AST: 15 (ref 13–35)
Alkaline Phosphatase: 114 (ref 25–125)
Bilirubin, Total: 0.4

## 2023-01-19 LAB — BASIC METABOLIC PANEL
BUN: 23 — AB (ref 4–21)
CO2: 30 — AB (ref 13–22)
Chloride: 101 (ref 99–108)
Creatinine: 1 (ref 0.5–1.1)
Glucose: 134
Potassium: 4 meq/L (ref 3.5–5.1)
Sodium: 141 (ref 137–147)

## 2023-01-19 LAB — CBC AND DIFFERENTIAL
HCT: 36 (ref 36–46)
Hemoglobin: 12.1 (ref 12.0–16.0)
Neutrophils Absolute: 6691
Platelets: 322 10*3/uL (ref 150–400)
WBC: 10.2

## 2023-01-19 LAB — TSH: TSH: 5.96 — AB (ref 0.41–5.90)

## 2023-01-19 LAB — CBC: RBC: 3.76 — AB (ref 3.87–5.11)

## 2023-01-19 LAB — COMPREHENSIVE METABOLIC PANEL
Albumin: 3.7 (ref 3.5–5.0)
Globulin: 2.3
eGFR: 55

## 2023-01-22 ENCOUNTER — Encounter: Payer: Self-pay | Admitting: Oncology

## 2023-02-23 ENCOUNTER — Encounter: Payer: Self-pay | Admitting: Oncology

## 2023-03-04 ENCOUNTER — Encounter: Payer: Self-pay | Admitting: Student

## 2023-03-04 ENCOUNTER — Non-Acute Institutional Stay (SKILLED_NURSING_FACILITY): Payer: Medicare Other | Admitting: Student

## 2023-03-04 DIAGNOSIS — E039 Hypothyroidism, unspecified: Secondary | ICD-10-CM

## 2023-03-04 DIAGNOSIS — Z66 Do not resuscitate: Secondary | ICD-10-CM

## 2023-03-04 DIAGNOSIS — I350 Nonrheumatic aortic (valve) stenosis: Secondary | ICD-10-CM | POA: Diagnosis not present

## 2023-03-04 DIAGNOSIS — I2782 Chronic pulmonary embolism: Secondary | ICD-10-CM

## 2023-03-04 DIAGNOSIS — K219 Gastro-esophageal reflux disease without esophagitis: Secondary | ICD-10-CM

## 2023-03-04 DIAGNOSIS — C7800 Secondary malignant neoplasm of unspecified lung: Secondary | ICD-10-CM

## 2023-03-04 DIAGNOSIS — F03B0 Unspecified dementia, moderate, without behavioral disturbance, psychotic disturbance, mood disturbance, and anxiety: Secondary | ICD-10-CM | POA: Diagnosis not present

## 2023-03-04 DIAGNOSIS — D849 Immunodeficiency, unspecified: Secondary | ICD-10-CM

## 2023-03-04 DIAGNOSIS — F331 Major depressive disorder, recurrent, moderate: Secondary | ICD-10-CM

## 2023-03-04 NOTE — Progress Notes (Unsigned)
Location:  Other [facility].  Nursing Home Room Number: [facility] [address] Place of Service:  SNF [phone]) Provider:  Earnestine Mealing, MD  Patient Care Team: Earnestine Mealing, MD as PCP - General (Family Medicine) Pyrtle, Carie Caddy, MD as Consulting Physician (Gastroenterology) Glory Buff, RN as Registered Nurse Rickard Patience, MD as Consulting Physician (Hematology and Oncology) Vilinda Flake, [facility] (Inactive) as Pharmacist (Pharmacist) Earvin Hansen, [facility] (Inactive) as Pharmacist (Pharmacist) Deirdre Evener, MD (Dermatology) Jerilee Field, MD as Consulting Physician (Urology)  Extended Emergency Contact Information Primary Emergency Contact: Vesta Mixer Address: [address]          [place], [place] [address] [place] of [place] Home Phone: [phone] Mobile Phone: [phone] Relation: Spouse Secondary Emergency Contact: Rindfuss,Ellen Mobile Phone: [phone] Relation: Sister Preferred language: English  Code Status:  DNR Goals of care: Advanced Directive information    03/04/2023    9:52 AM  Advanced Directives  Does Patient Have a Medical Advance Directive? Yes  Type of Advance Directive Out of facility DNR (pink MOST or yellow form)  Does patient want to make changes to medical advance directive? No - Patient declined     Chief Complaint  Patient presents with   Medical Management of Chronic Issues    Medical Management of Chronic Issues.     HPI:  Pt is a 82 y.o. female seen today for medical management of chronic diseases.   States she is doing well without any concerns at this time.  She is originally from [place].  Her [place] campus in assisted living.  They do not have children.  She states that she is eating well.  Have normal bowel movements.  Denies pain at this time. Patient is followed by hospice due to cancer diagnosis.  Nursing without acute concerns at this time.  She requires total assistance for self-care including  Michiel Sites lift usage and patient primarily sits in a [place]ckworth chair.  She is able to feed herself.  Requires assistance for showering.  Past Medical History:  Diagnosis Date   AKI (acute kidney injury) (HCC) 08/17/2018   Anxiety    Arthritis    Belching    Bladder disorder    OVERACTIVE   Bowel dysfunction    BLOCKAGE   Cancer (HCC)    breast   Constipation    Depression    Diverticulitis    Fibromyalgia    GERD (gastroesophageal reflux disease)    Hyperlipidemia    IBS (irritable bowel syndrome)    Internal hemorrhoids    Lung cancer (HCC) 2020   Memory deficits    Murmur    asymptomatic   Pneumonia 11/18/12   Urinary incontinence    Vertigo    Past Surgical History:  Procedure Laterality Date   AXILLARY LYMPH NODE BIOPSY Left 06/08/2018   INVASIVE MAMMARY CARCINOMA   BLADDER SUSPENSION  2004, 2012   BREAST BIOPSY Left 06/08/2018   INVASIVE MAMMARY CARCINOMA   CATARACT EXTRACTION W/PHACO Right 08/27/2015   Procedure: CATARACT EXTRACTION PHACO AND INTRAOCULAR LENS PLACEMENT (IOC);  Surgeon: Sallee Lange, MD;  Location: [facility] ORS;  Service: Ophthalmology;  Laterality: Right;  [place]   1:00.2 AP%  22.5 CDE  23.67 fluid casette lot #[id number]  exp05/31/2018   CATARACT EXTRACTION W/PHACO Left 10/15/2015   Procedure: CATARACT EXTRACTION PHACO AND INTRAOCULAR LENS PLACEMENT (IOC);  Surgeon: Sallee Lange, MD;  Location: [facility] ORS;  Service: Ophthalmology;  Laterality: Left;  [place] 01:07 AP% 18.1 CDE 21.57 fluid pack  lot # [id number]   CHOLECYSTECTOMY     COLONOSCOPY  2017   ELECTROMAGNETIC NAVIGATION BROCHOSCOPY N/A 07/09/2018   Procedure: ELECTROMAGNETIC NAVIGATION BRONCHOSCOPY;  Surgeon: Erin Fulling, MD;  Location: [facility] ORS;  Service: Cardiopulmonary;  Laterality: N/A;   TONSILLECTOMY  1947    Allergies  Allergen Reactions   Sulfa Antibiotics Rash    Outpatient Encounter Medications as of 03/04/2023  Medication Sig   acetaminophen (TYLENOL) 325 MG tablet Take 325 mg by mouth  every 6 (six) hours as needed for mild pain.    alum & mag hydroxide-simeth (MAALOX/MYLANTA) 200-200-20 MG/5ML suspension Take 30 mLs by mouth every 6 (six) hours as needed for indigestion or heartburn.   apixaban (ELIQUIS) 5 MG TABS tablet Take 5 mg by mouth 2 (two) times daily.   guaiFENesin (MUCINEX) 600 MG 12 hr tablet Take 600 mg by mouth 2 (two) times daily.   Infant Care Products [facility]) OINT Apply to buttocks,Coccyx, Sacrum topically as needed for redness.   levothyroxine (SYNTHROID) 25 MCG tablet TAKE 1 TABLET BY MOUTH DAILY   morphine 20 MG/5ML solution Take 0.25 mLs by mouth every hour as needed for pain. As needed for medium level of pain and SOB   morphine 20 MG/5ML solution Take 0.5 mLs by mouth every hour as needed for pain. For high level of pain and SOB   Multiple Vitamin (MULTIVITAMIN WITH MINERALS) TABS tablet Take 1 tablet by mouth daily.   omeprazole (PRILOSEC) 40 MG capsule Take 40 mg by mouth daily.   ondansetron (ZOFRAN) 8 MG tablet TAKE ONE TABLET EVERY EIGHT HOURS AS NEEDED FOR NAUSEA / VOMITING   polyethylene glycol powder (MIRALAX) 17 GM/SCOOP powder Take 1 Container by mouth daily as needed.   sennosides-docusate sodium (SENOKOT-S) 8.6-50 MG tablet Take 2 tablets by mouth daily.   venlafaxine (EFFEXOR) 75 MG tablet Take 75 mg by mouth daily.   Vitamin D, Cholecalciferol, 25 MCG (1000 UT) TABS Take 1 tablet by mouth daily.   Zinc Oxide (TRIPLE PASTE) 12.8 % ointment Apply 1 Application topically as needed for irritation.   [DISCONTINUED] ipratropium-albuterol (DUONEB) 0.5-2.5 (3) MG/3ML SOLN Take 3 mLs by nebulization every 6 (six) hours as needed.   No facility-administered encounter medications on file as of 03/04/2023.    Review of Systems  Immunization History  Administered Date(s) Administered   Fluad Quad(high Dose 65+) 02/01/2019   Influenza Split 02/05/2012   Influenza, High Dose Seasonal PF 03/23/2018   Influenza,inj,Quad PF,6+ Mos 02/15/2015,  02/24/2017   Influenza-Unspecified 02/23/2013, 03/11/2022   Moderna Sars-Covid-2 Vaccination 08/05/2019, 09/17/2019, 04/10/2020   Pneumococcal Conjugate-13 12/13/2013   Pneumococcal Polysaccharide-23 07/18/2015   Tdap 08/17/2019   Unspecified SARS-COV-2 Vaccination 02/15/2021   Pertinent  Health Maintenance Due  Topic Date Due   INFLUENZA VACCINE  12/25/2022   DEXA SCAN  Completed   Colonoscopy  Discontinued      02/09/2021    8:15 PM 02/10/2021    9:00 AM 02/10/2021    8:30 PM 02/11/2021    7:00 AM 02/20/2021    3:00 PM  Fall Risk  (RETIRED) Patient Fall Risk Level High fall risk High fall risk High fall risk High fall risk Low fall risk   Functional Status Survey:    Vitals:   03/04/23 0945  BP: 129/73  Pulse: 73  Resp: 17  Temp: (!) 97.2 F (36.2 C)  SpO2: 95%  Weight: 173 lb 6.4 oz (78.7 kg)  Height: 5\' 2"  (1.575 m)   Body mass index is  31.72 kg/m. Physical Exam Cardiovascular:     Rate and Rhythm: Normal rate.     Pulses: Normal pulses.  Pulmonary:     Effort: Pulmonary effort is normal.  Abdominal:     General: Bowel sounds are normal.     Palpations: Abdomen is soft.  Skin:    General: Skin is warm.  Neurological:     Mental Status: She is alert. Mental status is at baseline.     Labs reviewed: Recent Labs    03/10/22 0000 01/19/23 0000  NA 140 141  K 4.2 4.0  CL 104 101  CO2 28* 30*  BUN 33* 23*  CREATININE 1.0 1.0  CALCIUM 9.1  --    Recent Labs    03/10/22 0000 01/19/23 0000  AST 18 15  ALT 17 15  ALKPHOS 102 114  ALBUMIN 3.8 3.7   Recent Labs    03/10/22 0000 07/28/22 0000 01/19/23 0000  WBC 9.5 9.6 10.2  NEUTROABS  --  5,357.00 6,691.00  HGB 11.2* 11.5* 12.1  HCT 32* 32* 36  PLT 305 286 322   Lab Results  Component Value Date   TSH 5.96 (A) 01/19/2023   Lab Results  Component Value Date   HGBA1C 5.6 06/12/2014   Lab Results  Component Value Date   CHOL 168 11/11/2017   HDL 53.10 11/11/2017   LDLCALC 100 (H)  11/11/2017   LDLDIRECT 175.9 02/05/2012   TRIG 73.0 11/11/2017   CHOLHDL 3 11/11/2017    Significant Diagnostic Results in last 30 days:  No results found.  Assessment/Plan Moderate dementia without behavioral disturbance, psychotic disturbance, mood disturbance, or anxiety, unspecified dementia type (HCC)  Malignant neoplasm metastatic to lung, unspecified laterality (HCC)  Other chronic pulmonary embolism without acute cor pulmonale (HCC)  Aortic valve stenosis, etiology of cardiac valve disease unspecified  Gastroesophageal reflux disease, unspecified whether esophagitis present  Acquired hypothyroidism  Major depressive disorder, recurrent, moderate (HCC)  Immunocompromised (HCC)  DNR (do not resuscitate) Patient with advanced dementia decreased mobility and incontinence.  No acute changes of functional status or cognitive state at this time continue supportive care.  Diagnosis of metastatic malignancy to the lung complicated by history of pulmonary embolism.  Patient is currently on hospice and will continue Eliquis at this time.  Asymptomatic from about aortic valve stenosis at this time.  Hypothyroidism most recent TSH slightly above goal, will consider repeat lab in 6 weeks as patient recently recovering from COVID infection.  Mood stable at this time no longer on medication.  Continue to monitor.  Immunocompromise secondary to underlying cancer.  Patient remains DNR and comfort based measures at this time.    Family/ staff Communication:nursing, hospice nurse  Labs/tests ordered:  none

## 2023-03-05 ENCOUNTER — Encounter: Payer: Self-pay | Admitting: Student

## 2023-03-12 ENCOUNTER — Encounter: Payer: Self-pay | Admitting: Nurse Practitioner

## 2023-03-12 NOTE — Progress Notes (Deleted)
[place]  [place]  Nursing Home Room Number: [facility] [address]A Place of Service:  SNF [phone]) Abbey Chatters, NP  XWR:[id number], Benetta Spar, MD  Patient Care Team: Earnestine Mealing, MD as PCP - General (Family Medicine) Pyrtle, Carie Caddy, MD as Consulting Physician (Gastroenterology) Glory Buff, RN as Registered Nurse Rickard Patience, MD as Consulting Physician (Hematology and Oncology) Vilinda Flake, [facility] (Inactive) as Pharmacist (Pharmacist) Earvin Hansen, [facility] (Inactive) as Pharmacist (Pharmacist) Deirdre Evener, MD (Dermatology) Jerilee Field, MD as Consulting Physician (Urology)  Extended Emergency Contact Information Primary Emergency Contact: Vesta Mixer Address: [address]          [place], [place] [address] D[place] of [place] Home Phone: [phone] Mobile Phone: [phone] Relation: Spouse Secondary Emergency Contact: Rindfuss,Ellen Mobile Phone: [phone] Relation: Sister Preferred language: English  Goals of care: Advanced Directive information    03/12/2023   12:42 PM  Advanced Directives  Does Patient Have a Medical Advance Directive? Yes  Type of Advance Directive Out of facility DNR (pink MOST or yellow form)  Does patient want to make changes to medical advance directive? No - Patient declined     Chief Complaint  Patient presents with   Acute Visit    Pain.     HPI:  Pt is a 82 y.o. female seen today for an acute visit for Pain   Past Medical History:  Diagnosis Date   AKI (acute kidney injury) (HCC) 08/17/2018   Anxiety    Arthritis    Belching    Bladder disorder    OVERACTIVE   Bowel dysfunction    BLOCKAGE   Cancer (HCC)    breast   Constipation    Depression    Diverticulitis    Fibromyalgia    GERD (gastroesophageal reflux disease)    Hyperlipidemia    IBS (irritable bowel syndrome)    Internal hemorrhoids    Lung cancer (HCC) 2020   Memory deficits    Murmur    asymptomatic   Pneumonia  11/18/12   Urinary incontinence    Vertigo    Past Surgical History:  Procedure Laterality Date   AXILLARY LYMPH NODE BIOPSY Left 06/08/2018   INVASIVE MAMMARY CARCINOMA   BLADDER SUSPENSION  2004, 2012   BREAST BIOPSY Left 06/08/2018   INVASIVE MAMMARY CARCINOMA   CATARACT EXTRACTION W/PHACO Right 08/27/2015   Procedure: CATARACT EXTRACTION PHACO AND INTRAOCULAR LENS PLACEMENT (IOC);  Surgeon: Sallee Lange, MD;  Location: [facility] ORS;  Service: Ophthalmology;  Laterality: Right;  [place]   1:00.2 AP%  22.5 CDE  23.67 fluid casette lot #[id number]  exp05/31/2018   CATARACT EXTRACTION W/PHACO Left 10/15/2015   Procedure: CATARACT EXTRACTION PHACO AND INTRAOCULAR LENS PLACEMENT (IOC);  Surgeon: Sallee Lange, MD;  Location: [facility] ORS;  Service: Ophthalmology;  Laterality: Left;  [place] 01:07 AP% 18.1 CDE 21.57 fluid pack lot # [id number]   CHOLECYSTECTOMY     COLONOSCOPY  2017   ELECTROMAGNETIC NAVIGATION BROCHOSCOPY N/A 07/09/2018   Procedure: ELECTROMAGNETIC NAVIGATION BRONCHOSCOPY;  Surgeon: Erin Fulling, MD;  Location: [facility] ORS;  Service: Cardiopulmonary;  Laterality: N/A;   TONSILLECTOMY  1947    Allergies  Allergen Reactions   Sulfa Antibiotics Rash    Outpatient Encounter Medications as of 03/12/2023  Medication Sig   acetaminophen (TYLENOL) 325 MG tablet Take 325 mg by mouth every 6 (six) hours as needed for mild pain.    alum & mag hydroxide-simeth (MAALOX/MYLANTA) 200-200-20 MG/5ML suspension Take 30 mLs by mouth every 6 (six)  hours as needed for indigestion or heartburn.   apixaban (ELIQUIS) 5 MG TABS tablet Take 5 mg by mouth 2 (two) times daily.   Infant Care Products [facility]) OINT Apply to buttocks,Coccyx, Sacrum topically as needed for redness.   levothyroxine (SYNTHROID) 25 MCG tablet TAKE 1 TABLET BY MOUTH DAILY   morphine 20 MG/5ML solution Take 0.25 mLs by mouth every hour as needed for pain. As needed for medium level of pain and SOB   morphine 20 MG/5ML solution  Take 0.5 mLs by mouth every hour as needed for pain. For high level of pain and SOB   omeprazole (PRILOSEC) 40 MG capsule Take 40 mg by mouth daily.   ondansetron (ZOFRAN) 8 MG tablet TAKE ONE TABLET EVERY EIGHT HOURS AS NEEDED FOR NAUSEA / VOMITING   polyethylene glycol powder (MIRALAX) 17 GM/SCOOP powder Take 1 Container by mouth daily as needed.   sennosides-docusate sodium (SENOKOT-S) 8.6-50 MG tablet Take 2 tablets by mouth daily.   venlafaxine (EFFEXOR) 37.5 MG tablet Take 37.5 mg by mouth. Every other day   Zinc Oxide (TRIPLE PASTE) 12.8 % ointment Apply 1 Application topically as needed for irritation.   [DISCONTINUED] guaiFENesin (MUCINEX) 600 MG 12 hr tablet Take 600 mg by mouth 2 (two) times daily.   [DISCONTINUED] Multiple Vitamin (MULTIVITAMIN WITH MINERALS) TABS tablet Take 1 tablet by mouth daily.   [DISCONTINUED] venlafaxine (EFFEXOR) 75 MG tablet Take 75 mg by mouth daily.   [DISCONTINUED] Vitamin D, Cholecalciferol, 25 MCG (1000 UT) TABS Take 1 tablet by mouth daily.   No facility-administered encounter medications on file as of 03/12/2023.    Review of Systems***  Immunization History  Administered Date(s) Administered   Fluad Quad(high Dose 65+) 02/01/2019   Influenza Split 02/05/2012   Influenza, High Dose Seasonal PF 03/23/2018   Influenza,inj,Quad PF,6+ Mos 02/15/2015, 02/24/2017   Influenza-Unspecified 02/23/2013, 03/11/2022   Moderna Sars-Covid-2 Vaccination 08/05/2019, 09/17/2019, 04/10/2020   Pneumococcal Conjugate-13 12/13/2013   Pneumococcal Polysaccharide-23 07/18/2015   Tdap 08/17/2019   Unspecified SARS-COV-2 Vaccination 02/15/2021   Pertinent  Health Maintenance Due  Topic Date Due   INFLUENZA VACCINE  12/25/2022   DEXA SCAN  Completed   Colonoscopy  Discontinued      02/09/2021    8:15 PM 02/10/2021    9:00 AM 02/10/2021    8:30 PM 02/11/2021    7:00 AM 02/20/2021    3:00 PM  Fall Risk  (RETIRED) Patient Fall Risk Level High fall risk High fall  risk High fall risk High fall risk Low fall risk   Functional Status Survey:    Vitals:   03/12/23 1233  BP: 129/73  Pulse: 73  Resp: 17  Temp: (!) 97.2 F (36.2 C)  SpO2: 95%  Weight: 173 lb 6.4 oz (78.7 kg)  Height: 5\' 2"  (1.575 m)   Body mass index is 31.72 kg/m. Physical Exam***  Labs reviewed: Recent Labs    01/19/23 0000  NA 141  K 4.0  CL 101  CO2 30*  BUN 23*  CREATININE 1.0   Recent Labs    01/19/23 0000  AST 15  ALT 15  ALKPHOS 114  ALBUMIN 3.7   Recent Labs    07/28/22 0000 01/19/23 0000  WBC 9.6 10.2  NEUTROABS 5,357.00 6,691.00  HGB 11.5* 12.1  HCT 32* 36  PLT 286 322   Lab Results  Component Value Date   TSH 5.96 (A) 01/19/2023   Lab Results  Component Value Date   HGBA1C 5.6 06/12/2014  Lab Results  Component Value Date   CHOL 168 11/11/2017   HDL 53.10 11/11/2017   LDLCALC 100 (H) 11/11/2017   LDLDIRECT 175.9 02/05/2012   TRIG 73.0 11/11/2017   CHOLHDL 3 11/11/2017    Significant Diagnostic Results in last 30 days:  No results found.  Assessment/Plan There are no diagnoses linked to this encounter.   Janene Harvey. [facility]ag[facility] [phone]

## 2023-03-12 NOTE — Progress Notes (Signed)
This encounter was created in error - please disregard.

## 2023-05-14 ENCOUNTER — Encounter: Payer: Self-pay | Admitting: Nurse Practitioner

## 2023-05-14 ENCOUNTER — Non-Acute Institutional Stay (SKILLED_NURSING_FACILITY): Payer: Medicare Other | Admitting: Nurse Practitioner

## 2023-05-14 DIAGNOSIS — K5904 Chronic idiopathic constipation: Secondary | ICD-10-CM

## 2023-05-14 DIAGNOSIS — K219 Gastro-esophageal reflux disease without esophagitis: Secondary | ICD-10-CM

## 2023-05-14 DIAGNOSIS — C7800 Secondary malignant neoplasm of unspecified lung: Secondary | ICD-10-CM | POA: Diagnosis not present

## 2023-05-14 DIAGNOSIS — F03B0 Unspecified dementia, moderate, without behavioral disturbance, psychotic disturbance, mood disturbance, and anxiety: Secondary | ICD-10-CM | POA: Diagnosis not present

## 2023-05-14 DIAGNOSIS — I2782 Chronic pulmonary embolism: Secondary | ICD-10-CM

## 2023-05-14 DIAGNOSIS — E039 Hypothyroidism, unspecified: Secondary | ICD-10-CM

## 2023-05-14 NOTE — Progress Notes (Signed)
Location:  [facility] [place].  Nursing Home Room Number: [facility] [address]A Place of Service:  SNF [phone]) Abbey Chatters, NP  PCP: Earnestine Mealing, MD  Patient Care Team: Earnestine Mealing, MD as PCP - General (Family Medicine) Pyrtle, Carie Caddy, MD as Consulting Physician (Gastroenterology) Glory Buff, RN as Registered Nurse Rickard Patience, MD as Consulting Physician (Hematology and Oncology) Vilinda Flake, [facility] (Inactive) as Pharmacist (Pharmacist) Earvin Hansen, [facility] (Inactive) as Pharmacist (Pharmacist) Deirdre Evener, MD (Dermatology) Jerilee Field, MD as Consulting Physician (Urology)  Extended Emergency Contact Information Primary Emergency Contact: Vesta Mixer Address: [address]          [place], [place] [address] Darden [place] of [place] Home Phone: [phone] Mobile Phone: [phone] Relation: Spouse Secondary Emergency Contact: Rindfuss,Ellen Mobile Phone: [phone] Relation: Sister Preferred language: English  Goals of care: Advanced Directive information    05/14/2023   10:39 AM  Advanced Directives  Does Patient Have a Medical Advance Directive? Yes  Type of Advance Directive Out of facility DNR (pink MOST or yellow form)  Does patient want to make changes to medical advance directive? No - Patient declined     Chief Complaint  Patient presents with   Medical Management of Chronic Issues    Medical Management of Chronic Issues.     HPI:  Pt is a 82 y.o. female seen today for medical management of chronic disease.  Pt with hx of lung cancer, dementia, PE, GERD, hypothyroid, MDD.  She continues to have cognitive decline and nursing helps with care.  She is able to feed herself. Weight has slowly declined.  No wounds or skin breakdown.  She reports she is in pain but can not say where due to her dementia- appears comfortable in bed.  Sleeping well. No increase in anxiety or depression noted.    Past Medical History:  Diagnosis  Date   AKI (acute kidney injury) (HCC) 08/17/2018   Anxiety    Arthritis    Belching    Bladder disorder    OVERACTIVE   Bowel dysfunction    BLOCKAGE   Cancer (HCC)    breast   Constipation    Depression    Diverticulitis    Fibromyalgia    GERD (gastroesophageal reflux disease)    Hyperlipidemia    IBS (irritable bowel syndrome)    Internal hemorrhoids    Lung cancer (HCC) 2020   Memory deficits    Murmur    asymptomatic   Pneumonia 11/18/12   Urinary incontinence    Vertigo    Past Surgical History:  Procedure Laterality Date   AXILLARY LYMPH NODE BIOPSY Left 06/08/2018   INVASIVE MAMMARY CARCINOMA   BLADDER SUSPENSION  2004, 2012   BREAST BIOPSY Left 06/08/2018   INVASIVE MAMMARY CARCINOMA   CATARACT EXTRACTION W/PHACO Right 08/27/2015   Procedure: CATARACT EXTRACTION PHACO AND INTRAOCULAR LENS PLACEMENT (IOC);  Surgeon: Sallee Lange, MD;  Location: [facility] ORS;  Service: Ophthalmology;  Laterality: Right;  [place]   1:00.2 AP%  22.5 CDE  23.67 fluid casette lot #[id number] H  exp05/31/2018   CATARACT EXTRACTION W/PHACO Left 10/15/2015   Procedure: CATARACT EXTRACTION PHACO AND INTRAOCULAR LENS PLACEMENT (IOC);  Surgeon: Sallee Lange, MD;  Location: [facility] ORS;  Service: Ophthalmology;  Laterality: Left;  [place] 01:07 AP% 18.1 CDE 21.57 fluid pack lot # [id number]   CHOLECYSTECTOMY     COLONOSCOPY  2017   ELECTROMAGNETIC NAVIGATION BROCHOSCOPY N/A 07/09/2018   Procedure: ELECTROMAGNETIC NAVIGATION BRONCHOSCOPY;  Surgeon: Erin Fulling,  MD;  Location: [facility] ORS;  Service: Cardiopulmonary;  Laterality: N/A;   TONSILLECTOMY  1947    Allergies  Allergen Reactions   Sulfa Antibiotics Rash    Outpatient Encounter Medications as of 05/14/2023  Medication Sig   albuterol (ACCUNEB) 0.63 MG/3ML nebulizer solution Take 1 ampule by nebulization every 8 (eight) hours as needed for wheezing.   apixaban (ELIQUIS) 5 MG TABS tablet Take 5 mg by mouth 2 (two) times daily.   Infant Care  Products [facility]) OINT Apply to buttocks,Coccyx, Sacrum topically as needed for redness.   levothyroxine (SYNTHROID) 25 MCG tablet TAKE 1 TABLET BY MOUTH DAILY   omeprazole (PRILOSEC) 40 MG capsule Take 40 mg by mouth daily.   sennosides-docusate sodium (SENOKOT-S) 8.6-50 MG tablet Take 2 tablets by mouth daily.   Zinc Oxide (TRIPLE PASTE) 12.8 % ointment Apply 1 Application topically as needed for irritation.   acetaminophen (TYLENOL) 325 MG tablet Take 325 mg by mouth every 6 (six) hours as needed for mild pain.  (Patient not taking: Reported on 05/14/2023)   alum & mag hydroxide-simeth (MAALOX/MYLANTA) 200-200-20 MG/5ML suspension Take 30 mLs by mouth every 6 (six) hours as needed for indigestion or heartburn. (Patient not taking: Reported on 05/14/2023)   morphine 20 MG/5ML solution Take 0.25 mLs by mouth every hour as needed for pain. As needed for medium level of pain and SOB (Patient not taking: Reported on 05/14/2023)   morphine 20 MG/5ML solution Take 0.5 mLs by mouth every hour as needed for pain. For high level of pain and SOB (Patient not taking: Reported on 05/14/2023)   ondansetron (ZOFRAN) 8 MG tablet TAKE ONE TABLET EVERY EIGHT HOURS AS NEEDED FOR NAUSEA / VOMITING (Patient not taking: Reported on 05/14/2023)   polyethylene glycol powder (MIRALAX) 17 GM/SCOOP powder Take 1 Container by mouth daily as needed. (Patient not taking: Reported on 05/14/2023)   venlafaxine (EFFEXOR) 37.5 MG tablet Take 37.5 mg by mouth. Every other day (Patient not taking: Reported on 05/14/2023)   No facility-administered encounter medications on file as of 05/14/2023.    Review of Systems  Constitutional:  Negative for activity change, appetite change, fatigue and unexpected weight change.  HENT:  Negative for congestion and hearing loss.   Eyes: Negative.   Respiratory:  Negative for cough and shortness of breath.   Cardiovascular:  Negative for chest pain, palpitations and leg swelling.   Gastrointestinal:  Negative for abdominal pain, constipation and diarrhea.  Genitourinary:  Negative for difficulty urinating and dysuria.  Musculoskeletal:  Positive for myalgias. Negative for arthralgias.  Skin:  Negative for color change and wound.  Neurological:  Negative for dizziness and weakness.  Psychiatric/Behavioral:  Positive for confusion. Negative for agitation and behavioral problems.      Immunization History  Administered Date(s) Administered   Fluad Quad(high Dose 65+) 02/01/2019   Influenza Split 02/05/2012   Influenza, High Dose Seasonal PF 03/23/2018   Influenza,inj,Quad PF,6+ Mos 02/15/2015, 02/24/2017   Influenza-Unspecified 02/23/2013, 03/11/2022, 03/18/2023   Moderna Sars-Covid-2 Vaccination 08/05/2019, 09/17/2019, 04/10/2020   Pneumococcal Conjugate-13 12/13/2013   Pneumococcal Polysaccharide-23 07/18/2015   Tdap 08/17/2019   Unspecified SARS-COV-2 Vaccination 02/15/2021   Pertinent  Health Maintenance Due  Topic Date Due   INFLUENZA VACCINE  Completed   DEXA SCAN  Completed   Colonoscopy  Discontinued      02/09/2021    8:15 PM 02/10/2021    9:00 AM 02/10/2021    8:30 PM 02/11/2021    7:00 AM 02/20/2021  3:00 PM  Fall Risk  (RETIRED) Patient Fall Risk Level High fall risk High fall risk High fall risk High fall risk Low fall risk   Functional Status Survey:    Vitals:   05/14/23 1029  BP: 131/73  Pulse: 80  Resp: 20  Temp: 98 F (36.7 C)  SpO2: 94%  Weight: 166 lb (75.3 kg)  Height: 5\' 2"  (1.575 m)   Body mass index is 30.36 kg/m. Physical Exam Constitutional:      General: She is not in acute distress.    Appearance: She is well-developed. She is not diaphoretic.  HENT:     Head: Normocephalic and atraumatic.     Mouth/Throat:     Pharynx: No oropharyngeal exudate.  Eyes:     Conjunctiva/sclera: Conjunctivae normal.     Pupils: Pupils are equal, round, and reactive to light.  Cardiovascular:     Rate and Rhythm: Normal rate  and regular rhythm.     Heart sounds: Normal heart sounds.  Pulmonary:     Effort: Pulmonary effort is normal.     Breath sounds: Normal breath sounds.  Abdominal:     General: Bowel sounds are normal.     Palpations: Abdomen is soft.  Musculoskeletal:     Cervical back: Normal range of motion and neck supple.     Right lower leg: No edema.     Left lower leg: No edema.  Skin:    General: Skin is warm and dry.  Neurological:     Mental Status: She is alert.     Motor: Weakness present.     Gait: Gait abnormal.  Psychiatric:        Mood and Affect: Mood normal.     Labs reviewed: Recent Labs    01/19/23 0000  NA 141  K 4.0  CL 101  CO2 30*  BUN 23*  CREATININE 1.0   Recent Labs    01/19/23 0000  AST 15  ALT 15  ALKPHOS 114  ALBUMIN 3.7   Recent Labs    07/28/22 0000 01/19/23 0000  WBC 9.6 10.2  NEUTROABS 5,357.00 6,691.00  HGB 11.5* 12.1  HCT 32* 36  PLT 286 322   Lab Results  Component Value Date   TSH 5.96 (A) 01/19/2023   Lab Results  Component Value Date   HGBA1C 5.6 06/12/2014   Lab Results  Component Value Date   CHOL 168 11/11/2017   HDL 53.10 11/11/2017   LDLCALC 100 (H) 11/11/2017   LDLDIRECT 175.9 02/05/2012   TRIG 73.0 11/11/2017   CHOLHDL 3 11/11/2017    Significant Diagnostic Results in last 30 days:  No results found.  Assessment/Plan 1. Malignant neoplasm metastatic to lung, unspecified laterality (HCC) (Primary) Stable, pain possibility coming from cancer. Nursing to monitor. She has PRN morphine if needed Continues on hospice and supportive care.   2. Gastroesophageal reflux disease, unspecified whether esophagitis present Stable on omeprazole.   3. Acquired hypothyroidism TSH stable on synthroid   4. Moderate dementia without behavioral disturbance, psychotic disturbance, mood disturbance, or anxiety, unspecified dementia type (HCC) -Stable, no acute changes in cognitive or functional status, continue supportive  care.   5. Other chronic pulmonary embolism without acute cor pulmonale (HCC) -continues on eliquis, no signs of bleeding.   6. Constipation Well controlled on current regimen.   Janene Harvey. [facility]ag[facility] [phone]

## 2023-05-28 ENCOUNTER — Encounter: Payer: Self-pay | Admitting: Oncology

## 2023-07-01 ENCOUNTER — Non-Acute Institutional Stay (SKILLED_NURSING_FACILITY): Payer: Self-pay | Admitting: Student

## 2023-07-01 ENCOUNTER — Encounter: Payer: Self-pay | Admitting: Student

## 2023-07-01 DIAGNOSIS — F03B0 Unspecified dementia, moderate, without behavioral disturbance, psychotic disturbance, mood disturbance, and anxiety: Secondary | ICD-10-CM

## 2023-07-01 DIAGNOSIS — F331 Major depressive disorder, recurrent, moderate: Secondary | ICD-10-CM | POA: Diagnosis not present

## 2023-07-01 DIAGNOSIS — C7800 Secondary malignant neoplasm of unspecified lung: Secondary | ICD-10-CM

## 2023-07-01 DIAGNOSIS — Z66 Do not resuscitate: Secondary | ICD-10-CM

## 2023-07-01 NOTE — Progress Notes (Signed)
 Location:  [facility] [place].  Nursing Home Room Number: [facility] [address] Place of Service:  SNF [phone]) Provider:  Abdul Fine, MD  Patient Care Team: Abdul Fine, MD as PCP - General (Family Medicine) Pyrtle, Gordy HERO, MD as Consulting Physician (Gastroenterology) Verdene Gills, RN as Registered Nurse Babara Call, MD as Consulting Physician (Hematology and Oncology) Myra Rosaline FALCON, [facility] (Inactive) as Pharmacist (Pharmacist) Delores Lauraine NOVAK, [facility] (Inactive) as Pharmacist (Pharmacist) Hester Alm BROCKS, MD (Dermatology) Nieves Cough, MD as Consulting Physician (Urology)  Extended Emergency Contact Information Primary Emergency Contact: Fleeta Audrea Coy Address: [address]          [place], [place] [address] [place] Home Phone: [phone] Mobile Phone: [phone] Relation: Spouse Secondary Emergency Contact: Rindfuss,Ellen Mobile Phone: [phone] Relation: Sister Preferred language: English  Code Status:  DNR Goals of care: Advanced Directive information    07/01/2023   11:14 AM  Advanced Directives  Does Patient Have a Medical Advance Directive? Yes  Type of Advance Directive Out of facility DNR (pink MOST or yellow form)  Does patient want to make changes to medical advance directive? No - Patient declined     Chief Complaint  Patient presents with   Medical Management of Chronic Issues    Medical Management of Chronic Issues.     HPI:  Pt is a 83 y.o. female seen today for medical management of chronic diseases.   Patient with a history of malignancy and has shown signs of decline in her dementia. She says feels fine, however, gives limited answers. Per nursing she is staying in bed more often and eating less than previously. She has lost 20 lbs in the last 4 months. She remains on hospice, and hospice would like to discontinue her eliquis .   Past Medical History:  Diagnosis Date   AKI (acute kidney injury) (HCC) 08/17/2018    Anxiety    Arthritis    Belching    Bladder disorder    OVERACTIVE   Bowel dysfunction    BLOCKAGE   Cancer (HCC)    breast   Constipation    Depression    Diverticulitis    Fibromyalgia    GERD (gastroesophageal reflux disease)    Hyperlipidemia    IBS (irritable bowel syndrome)    Internal hemorrhoids    Lung cancer (HCC) 2020   Memory deficits    Murmur    asymptomatic   Pneumonia 11/18/12   Urinary incontinence    Vertigo    Past Surgical History:  Procedure Laterality Date   AXILLARY LYMPH NODE BIOPSY Left 06/08/2018   INVASIVE MAMMARY CARCINOMA   BLADDER SUSPENSION  2004, 2012   BREAST BIOPSY Left 06/08/2018   INVASIVE MAMMARY CARCINOMA   CATARACT EXTRACTION W/PHACO Right 08/27/2015   Procedure: CATARACT EXTRACTION PHACO AND INTRAOCULAR LENS PLACEMENT (IOC);  Surgeon: Steven Dingeldein, MD;  Location: [facility] ORS;  Service: Ophthalmology;  Laterality: Right;  [place]    1:00.2 AP%  22.5 CDE  23.67 fluid casette lot #[id number]  exp05/31/2018   CATARACT EXTRACTION W/PHACO Left 10/15/2015   Procedure: CATARACT EXTRACTION PHACO AND INTRAOCULAR LENS PLACEMENT (IOC);  Surgeon: Steven Dingeldein, MD;  Location: [facility] ORS;  Service: Ophthalmology;  Laterality: Left;  US  01:07 AP% 18.1 CDE 21.57 fluid pack lot # [id number]   CHOLECYSTECTOMY     COLONOSCOPY  2017   ELECTROMAGNETIC NAVIGATION BROCHOSCOPY N/A 07/09/2018   Procedure: ELECTROMAGNETIC NAVIGATION BRONCHOSCOPY;  Surgeon: Isaiah Scrivener, MD;  Location: [facility] ORS;  Service: Cardiopulmonary;  Laterality:  N/A;   TONSILLECTOMY  1947    Allergies  Allergen Reactions   Sulfa Antibiotics Rash    Outpatient Encounter Medications as of 07/01/2023  Medication Sig   albuterol  (ACCUNEB ) 0.63 MG/3ML nebulizer solution Take 1 ampule by nebulization every 8 (eight) hours as needed for wheezing.   apixaban  (ELIQUIS ) 5 MG TABS tablet Take 5 mg by mouth 2 (two) times daily.   Infant Care Products [facility]) OINT Apply to buttocks,Coccyx,  Sacrum topically as needed for redness.   levothyroxine  (SYNTHROID ) 25 MCG tablet TAKE 1 TABLET BY MOUTH DAILY   omeprazole  (PRILOSEC) 40 MG capsule Take 40 mg by mouth daily.   sennosides-docusate sodium  (SENOKOT-S) 8.6-50 MG tablet Take 2 tablets by mouth daily.   Zinc Oxide (TRIPLE PASTE) 12.8 % ointment Apply 1 Application topically as needed for irritation.   No facility-administered encounter medications on file as of 07/01/2023.    Review of Systems  Immunization History  Administered Date(s) Administered   Fluad Quad(high Dose 65+) 02/01/2019   Influenza Split 02/05/2012   Influenza, High Dose Seasonal PF 03/23/2018   Influenza,inj,Quad PF,6+ Mos 02/15/2015, 02/24/2017   Influenza-Unspecified 02/23/2013, 03/11/2022, 03/18/2023   Moderna Sars-Covid-2 Vaccination 08/05/2019, 09/17/2019, 04/10/2020   Pneumococcal Conjugate-13 12/13/2013   Pneumococcal Polysaccharide-23 07/18/2015   Tdap 08/17/2019   Unspecified SARS-COV-2 Vaccination 02/15/2021   Pertinent  Health Maintenance Due  Topic Date Due   INFLUENZA VACCINE  Completed   DEXA SCAN  Completed   Colonoscopy  Discontinued      02/09/2021    8:15 PM 02/10/2021    9:00 AM 02/10/2021    8:30 PM 02/11/2021    7:00 AM 02/20/2021    3:00 PM  Fall Risk  (RETIRED) Patient Fall Risk Level High fall risk High fall risk High fall risk High fall risk Low fall risk   Functional Status Survey:    Vitals:   07/01/23 1109  BP: 108/66  Pulse: 81  Resp: 16  Temp: 98 F (36.7 C)  SpO2: 94%  Weight: 153 lb 12.8 oz (69.8 kg)  Height: 5' 2 (1.575 m)   Body mass index is 28.13 kg/m. Physical Exam Cardiovascular:     Rate and Rhythm: Normal rate.     Pulses: Normal pulses.  Pulmonary:     Effort: Pulmonary effort is normal.  Neurological:     Mental Status: She is alert.    Labs reviewed: Recent Labs    01/19/23 0000  NA 141  K 4.0  CL 101  CO2 30*  BUN 23*  CREATININE 1.0   Recent Labs    01/19/23 0000  AST  15  ALT 15  ALKPHOS 114  ALBUMIN  3.7   Recent Labs    07/28/22 0000 01/19/23 0000  WBC 9.6 10.2  NEUTROABS 5,357.00 6,691.00  HGB 11.5* 12.1  HCT 32* 36  PLT 286 322   Lab Results  Component Value Date   TSH 5.96 (A) 01/19/2023   Lab Results  Component Value Date   HGBA1C 5.6 06/12/2014   Lab Results  Component Value Date   CHOL 168 11/11/2017   HDL 53.10 11/11/2017   LDLCALC 100 (H) 11/11/2017   LDLDIRECT 175.9 02/05/2012   TRIG 73.0 11/11/2017   CHOLHDL 3 11/11/2017    Significant Diagnostic Results in last 30 days:  No results found.  Assessment/Plan Moderate dementia without behavioral disturbance, psychotic disturbance, mood disturbance, or anxiety, unspecified dementia type (HCC), Chronic  Malignant neoplasm metastatic to lung, unspecified laterality (HCC), Chronic  Major depressive disorder, recurrent, moderate (HCC), Chronic  DNR (do not resuscitate) Patient has had recent notable decline noted by weight loss, poor PO intake. Mood is stable per her report. She is primarily on comfort-focused medications, including the eliquis  given her history of DVT. No medication changes at this time.  She is on hospice and followed by their support services.   Family/ staff Communication: nursing  Labs/tests ordered:  none

## 2023-07-02 DIAGNOSIS — F03B0 Unspecified dementia, moderate, without behavioral disturbance, psychotic disturbance, mood disturbance, and anxiety: Secondary | ICD-10-CM | POA: Insufficient documentation

## 2023-07-13 ENCOUNTER — Non-Acute Institutional Stay (SKILLED_NURSING_FACILITY): Payer: Self-pay | Admitting: Student

## 2023-07-13 ENCOUNTER — Other Ambulatory Visit: Payer: Self-pay | Admitting: Student

## 2023-07-13 DIAGNOSIS — Z515 Encounter for palliative care: Secondary | ICD-10-CM | POA: Insufficient documentation

## 2023-07-13 MED ORDER — LORAZEPAM 0.5 MG PO TABS
0.5000 mg | ORAL_TABLET | Freq: Three times a day (TID) | ORAL | 0 refills | Status: DC | PRN
Start: 2023-07-13 — End: 2023-10-01

## 2023-07-13 MED ORDER — MORPHINE SULFATE (CONCENTRATE) 20 MG/ML PO SOLN
5.0000 mg | Freq: Three times a day (TID) | ORAL | 0 refills | Status: DC | PRN
Start: 2023-07-13 — End: 2023-07-13

## 2023-07-13 MED ORDER — MORPHINE SULFATE (CONCENTRATE) 20 MG/ML PO SOLN
5.0000 mg | ORAL | 0 refills | Status: DC | PRN
Start: 2023-07-13 — End: 2024-01-06

## 2023-07-13 NOTE — Progress Notes (Signed)
Hospice patient with increased shortness of breath.

## 2023-07-13 NOTE — Progress Notes (Unsigned)
Location:  Other Nursing Home Room Number: [facility] [address]A Place of Service:  SNF [phone]) Provider:  Ander Gaster, Benetta Spar, MD  Patient Care Team: Earnestine Mealing, MD as PCP - General (Family Medicine) Pyrtle, Carie Caddy, MD as Consulting Physician (Gastroenterology) Glory Buff, RN as Registered Nurse Rickard Patience, MD as Consulting Physician (Hematology and Oncology) Vilinda Flake, [facility] (Inactive) as Pharmacist (Pharmacist) Earvin Hansen, [facility] (Inactive) as Pharmacist (Pharmacist) Deirdre Evener, MD (Dermatology) Jerilee Field, MD as Consulting Physician (Urology)  Extended Emergency Contact Information Primary Emergency Contact: Vesta Mixer Address: [address]          [place], [place] [address] [place] of [place] Home Phone: [phone] Mobile Phone: [phone] Relation: Spouse Secondary Emergency Contact: Rindfuss,Ellen Mobile Phone: [phone] Relation: Sister Preferred language: English  Code Status:  nursing  Goals of care: Advanced Directive information    07/01/2023   11:14 AM  Advanced Directives  Does Patient Have a Medical Advance Directive? Yes  Type of Advance Directive Out of facility DNR (pink MOST or yellow form)  Does patient want to make changes to medical advance directive? No - Patient declined     Chief Complaint  Patient presents with   hospice care    HPI:  Pt is a 83 y.o. female seen today for an acute visit for continued decline. Patient with minimal conversation. Per nursing, patient is intermittently refusing medication. Seen by hospice, who recommend discontinuation of Eliquis.   Spoke with patient's spouse at bedside whose primary concern is her comfort. He understands she is nearing end of life.   Contacted patient's sister for an update regarding current status. She understands patient has been declining medications. Discsused concern for missed doses of Eliquis impacting efficacy and may no longer be needed  - she agreed. Discussed starting comfort-based medications with morphine and ativan.   Past Medical History:  Diagnosis Date   AKI (acute kidney injury) (HCC) 08/17/2018   Anxiety    Arthritis    Belching    Bladder disorder    OVERACTIVE   Bowel dysfunction    BLOCKAGE   Cancer (HCC)    breast   Constipation    Depression    Diverticulitis    Fibromyalgia    GERD (gastroesophageal reflux disease)    Hyperlipidemia    IBS (irritable bowel syndrome)    Internal hemorrhoids    Lung cancer (HCC) 2020   Memory deficits    Murmur    asymptomatic   Pneumonia 11/18/12   Urinary incontinence    Vertigo    Past Surgical History:  Procedure Laterality Date   AXILLARY LYMPH NODE BIOPSY Left 06/08/2018   INVASIVE MAMMARY CARCINOMA   BLADDER SUSPENSION  2004, 2012   BREAST BIOPSY Left 06/08/2018   INVASIVE MAMMARY CARCINOMA   CATARACT EXTRACTION W/PHACO Right 08/27/2015   Procedure: CATARACT EXTRACTION PHACO AND INTRAOCULAR LENS PLACEMENT (IOC);  Surgeon: Sallee Lange, MD;  Location: [facility] ORS;  Service: Ophthalmology;  Laterality: Right;  [place]   1:00.2 AP%  22.5 CDE  23.67 fluid casette lot #[id number] H  exp05/31/2018   CATARACT EXTRACTION W/PHACO Left 10/15/2015   Procedure: CATARACT EXTRACTION PHACO AND INTRAOCULAR LENS PLACEMENT (IOC);  Surgeon: Sallee Lange, MD;  Location: [facility] ORS;  Service: Ophthalmology;  Laterality: Left;  [place] 01:07 AP% 18.1 CDE 21.57 fluid pack lot # [id number]   CHOLECYSTECTOMY     COLONOSCOPY  2017   ELECTROMAGNETIC NAVIGATION BROCHOSCOPY N/A 07/09/2018   Procedure: ELECTROMAGNETIC NAVIGATION BRONCHOSCOPY;  Surgeon: Erin Fulling, MD;  Location: [facility] ORS;  Service: Cardiopulmonary;  Laterality: N/A;   TONSILLECTOMY  1947    Allergies  Allergen Reactions   Sulfa Antibiotics Rash    Outpatient Encounter Medications as of 07/13/2023  Medication Sig   LORazepam (ATIVAN) 0.5 MG tablet Take 1 tablet (0.5 mg total) by mouth every 8 (eight) hours as  needed for anxiety (AGITATION).   albuterol (ACCUNEB) 0.63 MG/3ML nebulizer solution Take 1 ampule by nebulization every 8 (eight) hours as needed for wheezing.   apixaban (ELIQUIS) 5 MG TABS tablet Take 5 mg by mouth 2 (two) times daily.   Infant Care Products [facility]) OINT Apply to buttocks,Coccyx, Sacrum topically as needed for redness.   levothyroxine (SYNTHROID) 25 MCG tablet TAKE 1 TABLET BY MOUTH DAILY   morphine (ROXANOL) 20 MG/ML concentrated solution Take 0.25 mLs (5 mg total) by mouth every 4 (four) hours as needed.   omeprazole (PRILOSEC) 40 MG capsule Take 40 mg by mouth daily.   sennosides-docusate sodium (SENOKOT-S) 8.6-50 MG tablet Take 2 tablets by mouth daily.   Zinc Oxide (TRIPLE PASTE) 12.8 % ointment Apply 1 Application topically as needed for irritation.   No facility-administered encounter medications on file as of 07/13/2023.    Review of Systems  Immunization History  Administered Date(s) Administered   Fluad Quad(high Dose 65+) 02/01/2019   Influenza Split 02/05/2012   Influenza, High Dose Seasonal PF 03/23/2018   Influenza,inj,Quad PF,6+ Mos 02/15/2015, 02/24/2017   Influenza-Unspecified 02/23/2013, 03/11/2022, 03/18/2023   Moderna Sars-Covid-2 Vaccination 08/05/2019, 09/17/2019, 04/10/2020   Pneumococcal Conjugate-13 12/13/2013   Pneumococcal Polysaccharide-23 07/18/2015   Tdap 08/17/2019   Unspecified SARS-COV-2 Vaccination 02/15/2021   Pertinent  Health Maintenance Due  Topic Date Due   INFLUENZA VACCINE  Completed   DEXA SCAN  Completed   Colonoscopy  Discontinued      02/09/2021    8:15 PM 02/10/2021    9:00 AM 02/10/2021    8:30 PM 02/11/2021    7:00 AM 02/20/2021    3:00 PM  Fall Risk  (RETIRED) Patient Fall Risk Level High fall risk High fall risk High fall risk High fall risk Low fall risk   Functional Status Survey:    There were no vitals filed for this visit. There is no height or weight on file to calculate BMI. Physical  Exam  Labs reviewed: Recent Labs    01/19/23 0000  NA 141  K 4.0  CL 101  CO2 30*  BUN 23*  CREATININE 1.0   Recent Labs    01/19/23 0000  AST 15  ALT 15  ALKPHOS 114  ALBUMIN 3.7   Recent Labs    07/28/22 0000 01/19/23 0000  WBC 9.6 10.2  NEUTROABS 5,357.00 6,691.00  HGB 11.5* 12.1  HCT 32* 36  PLT 286 322   Lab Results  Component Value Date   TSH 5.96 (A) 01/19/2023   Lab Results  Component Value Date   HGBA1C 5.6 06/12/2014   Lab Results  Component Value Date   CHOL 168 11/11/2017   HDL 53.10 11/11/2017   LDLCALC 100 (H) 11/11/2017   LDLDIRECT 175.9 02/05/2012   TRIG 73.0 11/11/2017   CHOLHDL 3 11/11/2017    Significant Diagnostic Results in last 30 days:  No results found.  Assessment/Plan Hospice care patient - Plan: LORazepam (ATIVAN) 0.5 MG tablet Patient with poor PO intake and weightloss concerning for continued decline. Poor adherence to medication administration. Will seek comfort measures primarily at this time. Morphine  and ativan ordered.  Family/ staff Communication: nursing, sister, spouse  Labs/tests ordered:  none  I spent greater than 30 minutes for the care of this patient in face to face time, chart review, clinical documentation, patient education.

## 2023-07-15 ENCOUNTER — Encounter: Payer: Self-pay | Admitting: Student

## 2023-08-18 ENCOUNTER — Encounter: Payer: Self-pay | Admitting: Nurse Practitioner

## 2023-08-18 ENCOUNTER — Encounter: Payer: Self-pay | Admitting: Oncology

## 2023-08-18 ENCOUNTER — Non-Acute Institutional Stay (SKILLED_NURSING_FACILITY): Payer: Self-pay | Admitting: Nurse Practitioner

## 2023-08-18 DIAGNOSIS — R630 Anorexia: Secondary | ICD-10-CM | POA: Diagnosis not present

## 2023-08-18 DIAGNOSIS — F03B0 Unspecified dementia, moderate, without behavioral disturbance, psychotic disturbance, mood disturbance, and anxiety: Secondary | ICD-10-CM | POA: Diagnosis not present

## 2023-08-18 DIAGNOSIS — F331 Major depressive disorder, recurrent, moderate: Secondary | ICD-10-CM | POA: Diagnosis not present

## 2023-08-18 DIAGNOSIS — C7800 Secondary malignant neoplasm of unspecified lung: Secondary | ICD-10-CM

## 2023-08-18 DIAGNOSIS — K5904 Chronic idiopathic constipation: Secondary | ICD-10-CM

## 2023-08-18 DIAGNOSIS — Z515 Encounter for palliative care: Secondary | ICD-10-CM

## 2023-08-18 NOTE — Progress Notes (Signed)
 Location:  [facility]  Nursing Home Room Number: [facility] [address]A Place of Service:  SNF [phone]) Abbey Chatters, NP  PCP: Earnestine Mealing, MD  Patient Care Team: Earnestine Mealing, MD as PCP - General (Family Medicine) Pyrtle, Carie Caddy, MD as Consulting Physician (Gastroenterology) Glory Buff, RN as Registered Nurse Rickard Patience, MD as Consulting Physician (Hematology and Oncology) Vilinda Flake, [facility] (Inactive) as Pharmacist (Pharmacist) Earvin Hansen, [facility] (Inactive) as Pharmacist (Pharmacist) Deirdre Evener, MD (Dermatology) Jerilee Field, MD as Consulting Physician (Urology)  Extended Emergency Contact Information Primary Emergency Contact: Vesta Mixer Address: [address]          [place], [place] [address] [place] of [place] Home Phone: [phone] Mobile Phone: [phone] Relation: Spouse Secondary Emergency Contact: Rindfuss,Ellen Mobile Phone: [phone] Relation: Sister Preferred language: English  Goals of care: Advanced Directive information    07/01/2023   11:14 AM  Advanced Directives  Does Patient Have a Medical Advance Directive? Yes  Type of Advance Directive Out of facility DNR (pink MOST or yellow form)  Does patient want to make changes to medical advance directive? No - Patient declined     Chief Complaint  Patient presents with   Medical Management of Chronic Issues    Medical Management of Chronic Issues.     HPI:  Pt is a 83 y.o. female seen today for medical management of chronic disease.   At the time of this visit patient is sitting upright in bed eating her lunch. She is in a pleasant mood and denies any discomfort or pain. She is a current hospice patient. Per nursing staff she is generally not eating 100% of her meals. She is bed bound and does not get out of bed. Her husband lives in assisted living in the same community and visits her daily.  Her weight has been steady in the last month. She was 153 lbs  on 07/01/2023 and her current weight is 152 lbs.  She continues on hospice has active orders in place for comfort measures.  Past Medical History:  Diagnosis Date   AKI (acute kidney injury) (HCC) 08/17/2018   Anxiety    Arthritis    Belching    Bladder disorder    OVERACTIVE   Bowel dysfunction    BLOCKAGE   Cancer (HCC)    breast   Constipation    Depression    Diverticulitis    Fibromyalgia    GERD (gastroesophageal reflux disease)    Hyperlipidemia    IBS (irritable bowel syndrome)    Internal hemorrhoids    Lung cancer (HCC) 2020   Memory deficits    Murmur    asymptomatic   Pneumonia 11/18/12   Urinary incontinence    Vertigo    Past Surgical History:  Procedure Laterality Date   AXILLARY LYMPH NODE BIOPSY Left 06/08/2018   INVASIVE MAMMARY CARCINOMA   BLADDER SUSPENSION  2004, 2012   BREAST BIOPSY Left 06/08/2018   INVASIVE MAMMARY CARCINOMA   CATARACT EXTRACTION W/PHACO Right 08/27/2015   Procedure: CATARACT EXTRACTION PHACO AND INTRAOCULAR LENS PLACEMENT (IOC);  Surgeon: Sallee Lange, MD;  Location: [facility] ORS;  Service: Ophthalmology;  Laterality: Right;  [place]   1:00.2 AP%  22.5 CDE  23.67 fluid casette lot #[id number] H  exp05/31/2018   CATARACT EXTRACTION W/PHACO Left 10/15/2015   Procedure: CATARACT EXTRACTION PHACO AND INTRAOCULAR LENS PLACEMENT (IOC);  Surgeon: Sallee Lange, MD;  Location: [facility] ORS;  Service: Ophthalmology;  Laterality: Left;  [place] 01:07 AP%  18.1 CDE 21.57 fluid pack lot # [id number]   CHOLECYSTECTOMY     COLONOSCOPY  2017   ELECTROMAGNETIC NAVIGATION BROCHOSCOPY N/A 07/09/2018   Procedure: ELECTROMAGNETIC NAVIGATION BRONCHOSCOPY;  Surgeon: Erin Fulling, MD;  Location: [facility] ORS;  Service: Cardiopulmonary;  Laterality: N/A;   TONSILLECTOMY  1947    Allergies  Allergen Reactions   Sulfa Antibiotics Rash    Outpatient Encounter Medications as of 08/18/2023  Medication Sig   albuterol (ACCUNEB) 0.63 MG/3ML nebulizer solution Take 1  ampule by nebulization every 8 (eight) hours as needed for wheezing.   Infant Care Products [facility]) OINT Apply to buttocks,Coccyx, Sacrum topically as needed for redness.   morphine (ROXANOL) 20 MG/ML concentrated solution Take 0.25 mLs (5 mg total) by mouth every 4 (four) hours as needed.   Zinc Oxide (TRIPLE PASTE) 12.8 % ointment Apply 1 Application topically as needed for irritation.   LORazepam (ATIVAN) 0.5 MG tablet Take 1 tablet (0.5 mg total) by mouth every 8 (eight) hours as needed for anxiety (AGITATION). (Patient not taking: Reported on 08/18/2023)   omeprazole (PRILOSEC) 40 MG capsule Take 40 mg by mouth daily. (Patient not taking: Reported on 08/18/2023)   sennosides-docusate sodium (SENOKOT-S) 8.6-50 MG tablet Take 2 tablets by mouth daily. (Patient not taking: Reported on 08/18/2023)   No facility-administered encounter medications on file as of 08/18/2023.    Review of Systems  Unable to perform ROS: Dementia     Immunization History  Administered Date(s) Administered   Fluad Quad(high Dose 65+) 02/01/2019   Influenza Split 02/05/2012   Influenza, High Dose Seasonal PF 03/23/2018   Influenza,inj,Quad PF,6+ Mos 02/15/2015, 02/24/2017   Influenza-Unspecified 02/23/2013, 03/11/2022, 03/18/2023   Moderna Sars-Covid-2 Vaccination 08/05/2019, 09/17/2019, 04/10/2020, 02/15/2021, 02/22/2023   Pneumococcal Conjugate-13 12/13/2013   Pneumococcal Polysaccharide-23 07/18/2015   Tdap 08/17/2019   Unspecified SARS-COV-2 Vaccination 02/15/2021   Pertinent  Health Maintenance Due  Topic Date Due   INFLUENZA VACCINE  Completed   DEXA SCAN  Completed   Colonoscopy  Discontinued      02/09/2021    8:15 PM 02/10/2021    9:00 AM 02/10/2021    8:30 PM 02/11/2021    7:00 AM 02/20/2021    3:00 PM  Fall Risk  (RETIRED) Patient Fall Risk Level High fall risk High fall risk High fall risk High fall risk Low fall risk   Functional Status Survey:    Vitals:   08/18/23 1234  BP: 99/66   Pulse: 92  Resp: 16  Temp: 99.4 F (37.4 C)  SpO2: 92%  Weight: 152 lb (68.9 kg)  Height: 5\' 2"  (1.575 m)   Body mass index is 27.8 kg/m. Physical Exam Constitutional:      Appearance: Normal appearance.  HENT:     Head: Normocephalic and atraumatic.     Mouth/Throat:     Mouth: Mucous membranes are moist.     Pharynx: Oropharynx is clear.  Eyes:     Conjunctiva/sclera: Conjunctivae normal.  Cardiovascular:     Rate and Rhythm: Normal rate and regular rhythm.     Pulses: Normal pulses.     Heart sounds: Normal heart sounds.  Pulmonary:     Effort: Pulmonary effort is normal.     Breath sounds: Normal breath sounds.  Abdominal:     General: Bowel sounds are normal.     Palpations: Abdomen is soft.  Musculoskeletal:     Right foot: Normal.     Left foot: Foot drop present.  Skin:  General: Skin is warm and dry.  Neurological:     Mental Status: She is alert. Mental status is at baseline.  Psychiatric:        Mood and Affect: Mood normal.        Behavior: Behavior normal.     Labs reviewed: Recent Labs    01/19/23 0000  NA 141  K 4.0  CL 101  CO2 30*  BUN 23*  CREATININE 1.0   Recent Labs    01/19/23 0000  AST 15  ALT 15  ALKPHOS 114  ALBUMIN 3.7   Recent Labs    01/19/23 0000  WBC 10.2  NEUTROABS 6,691.00  HGB 12.1  HCT 36  PLT 322   Lab Results  Component Value Date   TSH 5.96 (A) 01/19/2023   Lab Results  Component Value Date   HGBA1C 5.6 06/12/2014   Lab Results  Component Value Date   CHOL 168 11/11/2017   HDL 53.10 11/11/2017   LDLCALC 100 (H) 11/11/2017   LDLDIRECT 175.9 02/05/2012   TRIG 73.0 11/11/2017   CHOLHDL 3 11/11/2017    Significant Diagnostic Results in last 30 days:  No results found.  Assessment/Plan Malignant neoplasm metastatic to lung, unspecified laterality (HCC) - Chronic, no new associated symptoms   Hospice care patient - Remains on Hospice care services - Has current orders for PO Ativan and  Roxanol as needed - Patient noted to be comfortable and not under any acute distress  Moderate dementia without behavioral disturbance, psychotic disturbance, mood disturbance, or anxiety, unspecified dementia type (HCC) - Stable, mood noted to be normal at this time - Forgetful about current events  4. Major depressive disorder, recurrent, moderate (HCC) - Stable, mood noted to be normal at this time  5. Loss of appetite - Noted to be enjoying her lunch today - Not eating 100% of meals per staff - Weight steady since February 2025  6. Chronic idiopathic constipation - Stable, no abdominal tenderness noted  - Per nursing staff no issues with bowel movements at this time    Irfat Magnus Ivan, RN, DNP - AGPCNP Student at [facility] -I personally was present during the history, physical exam and medical decision-making activities of this service and have verified that the service and findings are accurately documented in the student's note Clem Wisenbaker K. [facility] [phone]

## 2023-08-18 NOTE — Progress Notes (Deleted)
 Location:  Other [facility].  Nursing Home Room Number: [facility] [address]A Place of Service:  SNF [phone]) Abbey Chatters, NP  PCP: Earnestine Mealing, MD  Patient Care Team: Earnestine Mealing, MD as PCP - General (Family Medicine) Pyrtle, Carie Caddy, MD as Consulting Physician (Gastroenterology) Glory Buff, RN as Registered Nurse Rickard Patience, MD as Consulting Physician (Hematology and Oncology) Vilinda Flake, [facility] (Inactive) as Pharmacist (Pharmacist) Earvin Hansen, [facility] (Inactive) as Pharmacist (Pharmacist) Deirdre Evener, MD (Dermatology) Jerilee Field, MD as Consulting Physician (Urology)  Extended Emergency Contact Information Primary Emergency Contact: Vesta Mixer Address: [address]          [place], [place] [address] [place] of [place] Home Phone: [phone] Mobile Phone: [phone] Relation: Spouse Secondary Emergency Contact: Rindfuss,Ellen Mobile Phone: [phone] Relation: Sister Preferred language: English  Goals of care: Advanced Directive information    07/01/2023   11:14 AM  Advanced Directives  Does Patient Have a Medical Advance Directive? Yes  Type of Advance Directive Out of facility DNR (pink MOST or yellow form)  Does patient want to make changes to medical advance directive? No - Patient declined     Chief Complaint  Patient presents with   Medical Management of Chronic Issues    Medical Management of Chronic Issues.     HPI:  Pt is a 83 y.o. female seen today for medical management of chronic disease.    Past Medical History:  Diagnosis Date   AKI (acute kidney injury) (HCC) 08/17/2018   Anxiety    Arthritis    Belching    Bladder disorder    OVERACTIVE   Bowel dysfunction    BLOCKAGE   Cancer (HCC)    breast   Constipation    Depression    Diverticulitis    Fibromyalgia    GERD (gastroesophageal reflux disease)    Hyperlipidemia    IBS (irritable bowel syndrome)    Internal hemorrhoids    Lung cancer  (HCC) 2020   Memory deficits    Murmur    asymptomatic   Pneumonia 11/18/12   Urinary incontinence    Vertigo    Past Surgical History:  Procedure Laterality Date   AXILLARY LYMPH NODE BIOPSY Left 06/08/2018   INVASIVE MAMMARY CARCINOMA   BLADDER SUSPENSION  2004, 2012   BREAST BIOPSY Left 06/08/2018   INVASIVE MAMMARY CARCINOMA   CATARACT EXTRACTION W/PHACO Right 08/27/2015   Procedure: CATARACT EXTRACTION PHACO AND INTRAOCULAR LENS PLACEMENT (IOC);  Surgeon: Sallee Lange, MD;  Location: [facility] ORS;  Service: Ophthalmology;  Laterality: Right;  [place]   1:00.2 AP%  22.5 CDE  23.67 fluid casette lot #[id number]  exp05/31/2018   CATARACT EXTRACTION W/PHACO Left 10/15/2015   Procedure: CATARACT EXTRACTION PHACO AND INTRAOCULAR LENS PLACEMENT (IOC);  Surgeon: Sallee Lange, MD;  Location: [facility] ORS;  Service: Ophthalmology;  Laterality: Left;  [place] 01:07 AP% 18.1 CDE 21.57 fluid pack lot # [id number]   CHOLECYSTECTOMY     COLONOSCOPY  2017   ELECTROMAGNETIC NAVIGATION BROCHOSCOPY N/A 07/09/2018   Procedure: ELECTROMAGNETIC NAVIGATION BRONCHOSCOPY;  Surgeon: Erin Fulling, MD;  Location: [facility] ORS;  Service: Cardiopulmonary;  Laterality: N/A;   TONSILLECTOMY  1947    Allergies  Allergen Reactions   Sulfa Antibiotics Rash    Outpatient Encounter Medications as of 08/18/2023  Medication Sig   albuterol (ACCUNEB) 0.63 MG/3ML nebulizer solution Take 1 ampule by nebulization every 8 (eight) hours as needed for wheezing.   Infant Care Products [facility]) OINT Apply to buttocks,Coccyx,  Sacrum topically as needed for redness.   morphine (ROXANOL) 20 MG/ML concentrated solution Take 0.25 mLs (5 mg total) by mouth every 4 (four) hours as needed.   Zinc Oxide (TRIPLE PASTE) 12.8 % ointment Apply 1 Application topically as needed for irritation.   LORazepam (ATIVAN) 0.5 MG tablet Take 1 tablet (0.5 mg total) by mouth every 8 (eight) hours as needed for anxiety (AGITATION). (Patient not taking:  Reported on 08/18/2023)   omeprazole (PRILOSEC) 40 MG capsule Take 40 mg by mouth daily. (Patient not taking: Reported on 08/18/2023)   sennosides-docusate sodium (SENOKOT-S) 8.6-50 MG tablet Take 2 tablets by mouth daily. (Patient not taking: Reported on 08/18/2023)   No facility-administered encounter medications on file as of 08/18/2023.    Review of Systems ***  Immunization History  Administered Date(s) Administered   Fluad Quad(high Dose 65+) 02/01/2019   Influenza Split 02/05/2012   Influenza, High Dose Seasonal PF 03/23/2018   Influenza,inj,Quad PF,6+ Mos 02/15/2015, 02/24/2017   Influenza-Unspecified 02/23/2013, 03/11/2022, 03/18/2023   Moderna Sars-Covid-2 Vaccination 08/05/2019, 09/17/2019, 04/10/2020, 02/15/2021, 02/22/2023   Pneumococcal Conjugate-13 12/13/2013   Pneumococcal Polysaccharide-23 07/18/2015   Tdap 08/17/2019   Unspecified SARS-COV-2 Vaccination 02/15/2021   Pertinent  Health Maintenance Due  Topic Date Due   INFLUENZA VACCINE  Completed   DEXA SCAN  Completed   Colonoscopy  Discontinued      02/09/2021    8:15 PM 02/10/2021    9:00 AM 02/10/2021    8:30 PM 02/11/2021    7:00 AM 02/20/2021    3:00 PM  Fall Risk  (RETIRED) Patient Fall Risk Level High fall risk High fall risk High fall risk High fall risk Low fall risk   Functional Status Survey:    Vitals:   08/18/23 1234  BP: 99/66  Pulse: 92  Resp: 16  Temp: 99.4 F (37.4 C)  SpO2: 92%  Weight: 152 lb (68.9 kg)  Height: 5\' 2"  (1.575 m)   Body mass index is 27.8 kg/m. Physical Exam***  Labs reviewed: Recent Labs    01/19/23 0000  NA 141  K 4.0  CL 101  CO2 30*  BUN 23*  CREATININE 1.0   Recent Labs    01/19/23 0000  AST 15  ALT 15  ALKPHOS 114  ALBUMIN 3.7   Recent Labs    01/19/23 0000  WBC 10.2  NEUTROABS 6,691.00  HGB 12.1  HCT 36  PLT 322   Lab Results  Component Value Date   TSH 5.96 (A) 01/19/2023   Lab Results  Component Value Date   HGBA1C 5.6  06/12/2014   Lab Results  Component Value Date   CHOL 168 11/11/2017   HDL 53.10 11/11/2017   LDLCALC 100 (H) 11/11/2017   LDLDIRECT 175.9 02/05/2012   TRIG 73.0 11/11/2017   CHOLHDL 3 11/11/2017    Significant Diagnostic Results in last 30 days:  No results found.  Assessment/Plan No problem-specific Assessment & Plan notes found for this encounter.     Jamie Matthews. [facility] [phone]

## 2023-09-04 ENCOUNTER — Non-Acute Institutional Stay (SKILLED_NURSING_FACILITY): Payer: Self-pay | Admitting: Student

## 2023-09-04 ENCOUNTER — Encounter: Payer: Self-pay | Admitting: Student

## 2023-09-04 DIAGNOSIS — R053 Chronic cough: Secondary | ICD-10-CM

## 2023-09-04 NOTE — Progress Notes (Signed)
 Location:  Other [facility] [place].  Nursing Home Room Number: [facility] [address] Place of Service:  SNF [phone]) Provider:  Earnestine Mealing, MD  Patient Care Team: Earnestine Mealing, MD as PCP - General (Family Medicine) Pyrtle, Carie Caddy, MD as Consulting Physician (Gastroenterology) Glory Buff, RN as Registered Nurse Rickard Patience, MD as Consulting Physician (Hematology and Oncology) Vilinda Flake, [facility] (Inactive) as Pharmacist (Pharmacist) Earvin Hansen, [facility] (Inactive) as Pharmacist (Pharmacist) Deirdre Evener, MD (Dermatology) Jerilee Field, MD as Consulting Physician (Urology)  Extended Emergency Contact Information Primary Emergency Contact: Jamie Matthews Address: [address]          [place], [place] [address] [place] Home Phone: [phone] Mobile Phone: [phone] Relation: Spouse Secondary Emergency Contact: Rindfuss,Ellen Mobile Phone: [phone] Relation: Sister Preferred language: English  Code Status:  DNR Goals of care: Advanced Directive information    09/04/2023   10:48 AM  Advanced Directives  Does Patient Have a Medical Advance Directive? Yes  Type of Advance Directive Out of facility DNR (pink MOST or yellow form)  Does patient want to make changes to medical advance directive? No - Patient declined     Chief Complaint  Patient presents with   Cough    Productive Cough.     HPI:  Pt is a 83 y.o. female seen today for an acute visit for Productive Cough.   Patient's birthday was yesterday. They are doing a party later today.   She is on hospice and has had notable decline.   She states she is fine. When stating she looks great, she says "I am great!"    Past Medical History:  Diagnosis Date   AKI (acute kidney injury) (HCC) 08/17/2018   Anxiety    Arthritis    Belching    Bladder disorder    OVERACTIVE   Bowel dysfunction    BLOCKAGE   Cancer (HCC)    breast   Constipation    Depression    Diverticulitis     Fibromyalgia    GERD (gastroesophageal reflux disease)    Hyperlipidemia    IBS (irritable bowel syndrome)    Internal hemorrhoids    Lung cancer (HCC) 2020   Memory deficits    Murmur    asymptomatic   Pneumonia 11/18/12   Urinary incontinence    Vertigo    Past Surgical History:  Procedure Laterality Date   AXILLARY LYMPH NODE BIOPSY Left 06/08/2018   INVASIVE MAMMARY CARCINOMA   BLADDER SUSPENSION  2004, 2012   BREAST BIOPSY Left 06/08/2018   INVASIVE MAMMARY CARCINOMA   CATARACT EXTRACTION W/PHACO Right 08/27/2015   Procedure: CATARACT EXTRACTION PHACO AND INTRAOCULAR LENS PLACEMENT (IOC);  Surgeon: Sallee Lange, MD;  Location: [facility] ORS;  Service: Ophthalmology;  Laterality: Right;  [place]   1:00.2 AP%  22.5 CDE  23.67 fluid casette lot #[id number] H  exp05/31/2018   CATARACT EXTRACTION W/PHACO Left 10/15/2015   Procedure: CATARACT EXTRACTION PHACO AND INTRAOCULAR LENS PLACEMENT (IOC);  Surgeon: Sallee Lange, MD;  Location: [facility] ORS;  Service: Ophthalmology;  Laterality: Left;  [place] 01:07 AP% 18.1 CDE 21.57 fluid pack lot # [id number]   CHOLECYSTECTOMY     COLONOSCOPY  2017   ELECTROMAGNETIC NAVIGATION BROCHOSCOPY N/A 07/09/2018   Procedure: ELECTROMAGNETIC NAVIGATION BRONCHOSCOPY;  Surgeon: Erin Fulling, MD;  Location: [facility] ORS;  Service: Cardiopulmonary;  Laterality: N/A;   TONSILLECTOMY  1947    Allergies  Allergen Reactions   Sulfa Antibiotics Rash    Outpatient Encounter Medications as  of 09/04/2023  Medication Sig   albuterol (ACCUNEB) 0.63 MG/3ML nebulizer solution Take 1 ampule by nebulization every 8 (eight) hours as needed for wheezing.   Infant Care Products [facility]) OINT Apply to buttocks,Coccyx, Sacrum topically as needed for redness.   morphine (ROXANOL) 20 MG/ML concentrated solution Take 0.25 mLs (5 mg total) by mouth every 4 (four) hours as needed.   Zinc Oxide (TRIPLE PASTE) 12.8 % ointment Apply 1 Application topically as needed for irritation.    LORazepam (ATIVAN) 0.5 MG tablet Take 1 tablet (0.5 mg total) by mouth every 8 (eight) hours as needed for anxiety (AGITATION). (Patient not taking: Reported on 09/04/2023)   omeprazole (PRILOSEC) 40 MG capsule Take 40 mg by mouth daily. (Patient not taking: Reported on 09/04/2023)   sennosides-docusate sodium (SENOKOT-S) 8.6-50 MG tablet Take 2 tablets by mouth daily. (Patient not taking: Reported on 09/04/2023)   No facility-administered encounter medications on file as of 09/04/2023.    Review of Systems  Immunization History  Administered Date(s) Administered   Fluad Quad(high Dose 65+) 02/01/2019   Influenza Split 02/05/2012   Influenza, High Dose Seasonal PF 03/23/2018   Influenza,inj,Quad PF,6+ Mos 02/15/2015, 02/24/2017   Influenza-Unspecified 02/23/2013, 03/11/2022, 03/18/2023   Moderna Sars-Covid-2 Vaccination 08/05/2019, 09/17/2019, 04/10/2020, 02/15/2021, 02/22/2023   Pneumococcal Conjugate-13 12/13/2013   Pneumococcal Polysaccharide-23 07/18/2015   Tdap 08/17/2019   Unspecified SARS-COV-2 Vaccination 02/15/2021   Pertinent  Health Maintenance Due  Topic Date Due   INFLUENZA VACCINE  12/25/2023   DEXA SCAN  Completed   Colonoscopy  Discontinued      02/09/2021    8:15 PM 02/10/2021    9:00 AM 02/10/2021    8:30 PM 02/11/2021    7:00 AM 02/20/2021    3:00 PM  Fall Risk  (RETIRED) Patient Fall Risk Level High fall risk High fall risk High fall risk High fall risk Low fall risk   Functional Status Survey:    Vitals:   09/04/23 1042  BP: 125/66  Pulse: 71  Resp: 18  Temp: 97.8 F (36.6 C)  SpO2: 92%  Weight: 141 lb 6.4 oz (64.1 kg)  Height: 5\' 2"  (1.575 m)   Body mass index is 25.86 kg/m. Physical Exam Pulmonary:     Effort: Pulmonary effort is normal.     Comments: 1 cough in 15 minutes Skin:    General: Skin is warm.  Neurological:     Mental Status: She is alert. Mental status is at baseline.     Comments: Oriented to self and place, not to time      Labs reviewed: Recent Labs    01/19/23 0000  NA 141  K 4.0  CL 101  CO2 30*  BUN 23*  CREATININE 1.0   Recent Labs    01/19/23 0000  AST 15  ALT 15  ALKPHOS 114  ALBUMIN 3.7   Recent Labs    01/19/23 0000  WBC 10.2  NEUTROABS 6,691.00  HGB 12.1  HCT 36  PLT 322   Lab Results  Component Value Date   TSH 5.96 (A) 01/19/2023   Lab Results  Component Value Date   HGBA1C 5.6 06/12/2014   Lab Results  Component Value Date   CHOL 168 11/11/2017   HDL 53.10 11/11/2017   LDLCALC 100 (H) 11/11/2017   LDLDIRECT 175.9 02/05/2012   TRIG 73.0 11/11/2017   CHOLHDL 3 11/11/2017    Significant Diagnostic Results in last 30 days:  No results found.  Assessment/Plan Chronic cough Patient has  had periodic cough. Likely secondary to chronic aspiration. Discussed wit hospice, and will defer work up at this time as she appears comfortable. NO oxygen supplementation or increased work of breathing. Provide mucinex daily x7 days to help with symptoms. Start zyrtec due to concern for allergies at this time.     Family/ staff Communication: nursing  Labs/tests ordered:  none

## 2023-09-05 ENCOUNTER — Encounter: Payer: Self-pay | Admitting: Student

## 2023-09-30 ENCOUNTER — Non-Acute Institutional Stay (SKILLED_NURSING_FACILITY): Payer: Self-pay | Admitting: Student

## 2023-09-30 ENCOUNTER — Encounter: Payer: Self-pay | Admitting: Student

## 2023-09-30 DIAGNOSIS — Z66 Do not resuscitate: Secondary | ICD-10-CM

## 2023-09-30 DIAGNOSIS — F03B0 Unspecified dementia, moderate, without behavioral disturbance, psychotic disturbance, mood disturbance, and anxiety: Secondary | ICD-10-CM

## 2023-09-30 DIAGNOSIS — C3492 Malignant neoplasm of unspecified part of left bronchus or lung: Secondary | ICD-10-CM

## 2023-09-30 DIAGNOSIS — I350 Nonrheumatic aortic (valve) stenosis: Secondary | ICD-10-CM

## 2023-09-30 DIAGNOSIS — Z515 Encounter for palliative care: Secondary | ICD-10-CM

## 2023-09-30 DIAGNOSIS — N3946 Mixed incontinence: Secondary | ICD-10-CM

## 2023-09-30 DIAGNOSIS — C50912 Malignant neoplasm of unspecified site of left female breast: Secondary | ICD-10-CM

## 2023-09-30 DIAGNOSIS — D849 Immunodeficiency, unspecified: Secondary | ICD-10-CM

## 2023-09-30 NOTE — Progress Notes (Unsigned)
 Location:  Other [facility] [place].  Nursing Home Room Number: [facility] [address] Place of Service:  SNF [phone]) Provider: Valrie Gehrig, MD  Patient Care Team: Valrie Gehrig, MD as PCP - General (Family Medicine) Pyrtle, Amber Bail, MD as Consulting Physician (Gastroenterology) Drake Gens, RN as Registered Nurse Timmy Forbes, MD as Consulting Physician (Hematology and Oncology) Alta Ast, [facility] (Inactive) as Pharmacist (Pharmacist) Steven Elam, [facility] (Inactive) as Pharmacist (Pharmacist) Elta Halter, MD (Dermatology) Christina Coyer, MD as Consulting Physician (Urology)  Extended Emergency Contact Information Primary Emergency Contact: Hermine Loots Address: [address]          [place], [place] [address] [place] Home Phone: [phone] Mobile Phone: [phone] Relation: Spouse Secondary Emergency Contact: Rindfuss,Ellen Mobile Phone: [phone] Relation: Sister Preferred language: English  Code Status:  DNR Goals of care: Advanced Directive information    09/04/2023   10:48 AM  Advanced Directives  Does Patient Have a Medical Advance Directive? Yes  Type of Advance Directive Out of facility DNR (pink MOST or yellow form)  Does patient want to make changes to medical advance directive? No - Patient declined     Chief Complaint  Patient presents with   Medical Management of Chronic Issues    Medical Management of Chronic Issues.     HPI:  Pt is a 83 y.o. female seen today for medical management of chronic diseases.    Patient is resting deeply in bed. No concerns per nursing at this time. She has had fluctuations in her symptoms over the last few months. Periodically has shortness of breath and symptoms, but has recovered. She is on hospice and comfort measures. Supportive husband is often present for appointments and visits daily.   Past Medical History:  Diagnosis Date   AKI (acute kidney injury) (HCC) 08/17/2018   Anxiety     Arthritis    Belching    Bladder disorder    OVERACTIVE   Bowel dysfunction    BLOCKAGE   Cancer (HCC)    breast   Constipation    Depression    Diverticulitis    Fibromyalgia    GERD (gastroesophageal reflux disease)    Hyperlipidemia    IBS (irritable bowel syndrome)    Internal hemorrhoids    Lung cancer (HCC) 2020   Memory deficits    Murmur    asymptomatic   Pneumonia 11/18/12   Urinary incontinence    Vertigo    Past Surgical History:  Procedure Laterality Date   AXILLARY LYMPH NODE BIOPSY Left 06/08/2018   INVASIVE MAMMARY CARCINOMA   BLADDER SUSPENSION  2004, 2012   BREAST BIOPSY Left 06/08/2018   INVASIVE MAMMARY CARCINOMA   CATARACT EXTRACTION W/PHACO Right 08/27/2015   Procedure: CATARACT EXTRACTION PHACO AND INTRAOCULAR LENS PLACEMENT (IOC);  Surgeon: Steven Dingeldein, MD;  Location: [facility] ORS;  Service: Ophthalmology;  Laterality: Right;  [place]    1:00.2 AP%  22.5 CDE  23.67 fluid casette lot #[id number] H  exp05/31/2018   CATARACT EXTRACTION W/PHACO Left 10/15/2015   Procedure: CATARACT EXTRACTION PHACO AND INTRAOCULAR LENS PLACEMENT (IOC);  Surgeon: Steven Dingeldein, MD;  Location: [facility] ORS;  Service: Ophthalmology;  Laterality: Left;  US  01:07 AP% 18.1 CDE 21.57 fluid pack lot # [id number]   CHOLECYSTECTOMY     COLONOSCOPY  2017   ELECTROMAGNETIC NAVIGATION BROCHOSCOPY N/A 07/09/2018   Procedure: ELECTROMAGNETIC NAVIGATION BRONCHOSCOPY;  Surgeon: Cleve Dale, MD;  Location: [facility] ORS;  Service: Cardiopulmonary;  Laterality: N/A;   TONSILLECTOMY  1947  Allergies  Allergen Reactions   Sulfa Antibiotics Rash    Outpatient Encounter Medications as of 09/30/2023  Medication Sig   albuterol  (ACCUNEB ) 0.63 MG/3ML nebulizer solution Take 1 ampule by nebulization every 8 (eight) hours as needed for wheezing.   Infant Care Products [facility]) OINT Apply to buttocks,Coccyx, Sacrum topically as needed for redness.   morphine  (ROXANOL) 20 MG/ML concentrated solution  Take 0.25 mLs (5 mg total) by mouth every 4 (four) hours as needed.   Zinc Oxide (TRIPLE PASTE) 12.8 % ointment Apply 1 Application topically as needed for irritation.   LORazepam  (ATIVAN ) 0.5 MG tablet Take 1 tablet (0.5 mg total) by mouth every 8 (eight) hours as needed for anxiety (AGITATION). (Patient not taking: Reported on 08/18/2023)   omeprazole  (PRILOSEC) 40 MG capsule Take 40 mg by mouth daily. (Patient not taking: Reported on 08/18/2023)   sennosides-docusate sodium  (SENOKOT-S) 8.6-50 MG tablet Take 2 tablets by mouth daily. (Patient not taking: Reported on 08/18/2023)   No facility-administered encounter medications on file as of 09/30/2023.    Review of Systems  Immunization History  Administered Date(s) Administered   Fluad Quad(high Dose 65+) 02/01/2019   Influenza Split 02/05/2012   Influenza, High Dose Seasonal PF 03/23/2018   Influenza,inj,Quad PF,6+ Mos 02/15/2015, 02/24/2017   Influenza-Unspecified 02/23/2013, 03/11/2022, 03/18/2023   Moderna Sars-Covid-2 Vaccination 08/05/2019, 09/17/2019, 04/10/2020, 02/15/2021, 02/22/2023   Pneumococcal Conjugate-13 12/13/2013   Pneumococcal Polysaccharide-23 07/18/2015   Tdap 08/17/2019   Unspecified SARS-COV-2 Vaccination 02/15/2021   Pertinent  Health Maintenance Due  Topic Date Due   INFLUENZA VACCINE  12/25/2023   DEXA SCAN  Completed   Colonoscopy  Discontinued      02/09/2021    8:15 PM 02/10/2021    9:00 AM 02/10/2021    8:30 PM 02/11/2021    7:00 AM 02/20/2021    3:00 PM  Fall Risk  (RETIRED) Patient Fall Risk Level High fall risk High fall risk High fall risk High fall risk Low fall risk   Functional Status Survey:    Vitals:   09/30/23 1252  BP: 122/78  Pulse: 83  Resp: 20  Temp: 99.7 F (37.6 C)  SpO2: 92%  Weight: 141 lb 6.4 oz (64.1 kg)  Height: 5\' 2"  (1.575 m)   Body mass index is 25.86 kg/m. Physical Exam Constitutional:      Comments: Sleeping deeply in bed  Cardiovascular:     Rate and Rhythm:  Normal rate.     Pulses: Normal pulses.  Pulmonary:     Effort: Pulmonary effort is normal.  Abdominal:     General: Abdomen is flat.     Palpations: Abdomen is soft.     Labs reviewed: Recent Labs    01/19/23 0000  NA 141  K 4.0  CL 101  CO2 30*  BUN 23*  CREATININE 1.0   Recent Labs    01/19/23 0000  AST 15  ALT 15  ALKPHOS 114  ALBUMIN  3.7   Recent Labs    01/19/23 0000  WBC 10.2  NEUTROABS 6,691.00  HGB 12.1  HCT 36  PLT 322   Lab Results  Component Value Date   TSH 5.96 (A) 01/19/2023   Lab Results  Component Value Date   HGBA1C 5.6 06/12/2014   Lab Results  Component Value Date   CHOL 168 11/11/2017   HDL 53.10 11/11/2017   LDLCALC 100 (H) 11/11/2017   LDLDIRECT 175.9 02/05/2012   TRIG 73.0 11/11/2017   CHOLHDL 3 11/11/2017  Significant Diagnostic Results in last 30 days:  No results found.  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 At this time, patient is on hospice and comfort measures only if she has progression of symptoms. Continued incontinence and total care provided by nursing staff. Dementia Stage 7c at this time. Continue supportive care.   Family/ staff Communication: nursing  Labs/tests ordered:  none

## 2023-10-01 ENCOUNTER — Encounter: Payer: Self-pay | Admitting: Student

## 2023-11-12 ENCOUNTER — Encounter: Payer: Self-pay | Admitting: Oncology

## 2023-12-14 ENCOUNTER — Encounter: Payer: Self-pay | Admitting: Oncology

## 2023-12-15 ENCOUNTER — Encounter: Payer: Self-pay | Admitting: Nurse Practitioner

## 2023-12-15 ENCOUNTER — Non-Acute Institutional Stay (SKILLED_NURSING_FACILITY): Payer: Self-pay | Admitting: Nurse Practitioner

## 2023-12-15 DIAGNOSIS — F03B0 Unspecified dementia, moderate, without behavioral disturbance, psychotic disturbance, mood disturbance, and anxiety: Secondary | ICD-10-CM

## 2023-12-15 DIAGNOSIS — I350 Nonrheumatic aortic (valve) stenosis: Secondary | ICD-10-CM | POA: Diagnosis not present

## 2023-12-15 DIAGNOSIS — C3492 Malignant neoplasm of unspecified part of left bronchus or lung: Secondary | ICD-10-CM | POA: Diagnosis not present

## 2023-12-15 DIAGNOSIS — F331 Major depressive disorder, recurrent, moderate: Secondary | ICD-10-CM

## 2023-12-15 DIAGNOSIS — C50912 Malignant neoplasm of unspecified site of left female breast: Secondary | ICD-10-CM | POA: Diagnosis not present

## 2023-12-15 NOTE — Assessment & Plan Note (Signed)
 Comfort care only Followed by hospice.

## 2023-12-15 NOTE — Assessment & Plan Note (Signed)
 Continues on hospice services with comfort measures.

## 2023-12-15 NOTE — Assessment & Plan Note (Signed)
Stable, continue supportive care

## 2023-12-15 NOTE — Assessment & Plan Note (Signed)
 Stable without shortness of breath or edema.

## 2023-12-15 NOTE — Assessment & Plan Note (Signed)
 Stable, Not currently on medication

## 2023-12-15 NOTE — Progress Notes (Signed)
 Location:  [place].  Nursing Home Room Number: [facility] [address] Place of Service:  SNF [phone]) Harlene An, NP  PCP: Abdul Fine, MD  Patient Care Team: Abdul Fine, MD as PCP - General (Family Medicine) Pyrtle, Gordy HERO, MD as Consulting Physician (Gastroenterology) Verdene Gills, RN as Registered Nurse Babara Call, MD as Consulting Physician (Hematology and Oncology) Myra Rosaline FALCON, [facility] (Inactive) as Pharmacist (Pharmacist) Delores Lauraine NOVAK, [facility] (Inactive) as Pharmacist (Pharmacist) Hester Alm BROCKS, MD (Dermatology) Nieves Cough, MD as Consulting Physician (Urology)  Extended Emergency Contact Information Primary Emergency Contact: Fleeta Audrea Coy Address: [address]          [place], [place] [address] [place] Home Phone: [phone] Mobile Phone: [phone] Relation: Spouse Secondary Emergency Contact: Rindfuss,Ellen Mobile Phone: [phone] Relation: Sister Preferred language: English  Goals of care: Advanced Directive information    12/15/2023    3:11 PM  Advanced Directives  Does Patient Have a Medical Advance Directive? Yes  Type of Advance Directive Out of facility DNR (pink MOST or yellow form)  Does patient want to make changes to medical advance directive? No - Patient declined     Chief Complaint  Patient presents with   Medical Management of Chronic Issues    Medical Management of Chronic Issues.     HPI:  Pt is a 83 y.o. female seen today for medical management of chronic disease. Pt with metastatic lung cancer and on hospice.  She is comfort measures only She is now bed bound and total care.  She has been refusing to be weighted.  Today she denies pain. She reports she is tired.  Denies shortness of breath or chest pain.  Denies anxiety or depression Staff has no acute concerns   Past Medical History:  Diagnosis Date   AKI (acute kidney injury) (HCC) 08/17/2018   Anxiety    Arthritis     Belching    Bladder disorder    OVERACTIVE   Bowel dysfunction    BLOCKAGE   Cancer (HCC)    breast   Constipation    Depression    Diverticulitis    Fibromyalgia    GERD (gastroesophageal reflux disease)    Hyperlipidemia    IBS (irritable bowel syndrome)    Internal hemorrhoids    Lung cancer (HCC) 2020   Memory deficits    Murmur    asymptomatic   Pneumonia 11/18/12   Urinary incontinence    Vertigo    Past Surgical History:  Procedure Laterality Date   AXILLARY LYMPH NODE BIOPSY Left 06/08/2018   INVASIVE MAMMARY CARCINOMA   BLADDER SUSPENSION  2004, 2012   BREAST BIOPSY Left 06/08/2018   INVASIVE MAMMARY CARCINOMA   CATARACT EXTRACTION W/PHACO Right 08/27/2015   Procedure: CATARACT EXTRACTION PHACO AND INTRAOCULAR LENS PLACEMENT (IOC);  Surgeon: Steven Dingeldein, MD;  Location: [facility] ORS;  Service: Ophthalmology;  Laterality: Right;  US    1:00.2 AP%  22.5 CDE  23.67 fluid casette lot #[id number] H  exp05/31/2018   CATARACT EXTRACTION W/PHACO Left 10/15/2015   Procedure: CATARACT EXTRACTION PHACO AND INTRAOCULAR LENS PLACEMENT (IOC);  Surgeon: Steven Dingeldein, MD;  Location: [facility] ORS;  Service: Ophthalmology;  Laterality: Left;  US  01:07 AP% 18.1 CDE 21.57 fluid pack lot # [id number]   CHOLECYSTECTOMY     COLONOSCOPY  2017   ELECTROMAGNETIC NAVIGATION BROCHOSCOPY N/A 07/09/2018   Procedure: ELECTROMAGNETIC NAVIGATION BRONCHOSCOPY;  Surgeon: Isaiah Scrivener, MD;  Location: [facility] ORS;  Service: Cardiopulmonary;  Laterality: N/A;  TONSILLECTOMY  1947    Allergies  Allergen Reactions   Sulfa Antibiotics Rash    Outpatient Encounter Medications as of 12/15/2023  Medication Sig   acetaminophen  (TYLENOL ) 500 MG tablet Take 1,000 mg by mouth every 8 (eight) hours as needed.   albuterol  (ACCUNEB ) 0.63 MG/3ML nebulizer solution Take 1 ampule by nebulization every 8 (eight) hours as needed for wheezing.   morphine  (ROXANOL) 20 MG/ML concentrated solution Take 0.25 mLs (5 mg total)  by mouth every 4 (four) hours as needed.   nystatin  cream (MYCOSTATIN ) Apply 1 Application topically 2 (two) times daily as needed for dry skin.   Zinc Oxide (TRIPLE PASTE) 12.8 % ointment Apply 1 Application topically as needed for irritation.   Infant Care Products [facility]) OINT Apply to buttocks,Coccyx, Sacrum topically as needed for redness. (Patient not taking: Reported on 12/15/2023)   No facility-administered encounter medications on file as of 12/15/2023.    Review of Systems  Unable to perform ROS: Dementia     Immunization History  Administered Date(s) Administered   Fluad Quad(high Dose 65+) 02/01/2019   Influenza Split 02/05/2012   Influenza, High Dose Seasonal PF 03/23/2018   Influenza,inj,Quad PF,6+ Mos 02/15/2015, 02/24/2017   Influenza-Unspecified 02/23/2013, 03/11/2022, 03/18/2023   Moderna Sars-Covid-2 Vaccination 08/05/2019, 09/17/2019, 04/10/2020, 02/15/2021, 02/22/2023   Pneumococcal Conjugate-13 12/13/2013   Pneumococcal Polysaccharide-23 07/18/2015   Tdap 08/17/2019   Unspecified SARS-COV-2 Vaccination 02/15/2021   Pertinent  Health Maintenance Due  Topic Date Due   INFLUENZA VACCINE  12/25/2023   DEXA SCAN  Completed   Colonoscopy  Discontinued      02/09/2021    8:15 PM 02/10/2021    9:00 AM 02/10/2021    8:30 PM 02/11/2021    7:00 AM 02/20/2021    3:00 PM  Fall Risk  (RETIRED) Patient Fall Risk Level High fall risk  High fall risk  High fall risk  High fall risk  Low fall risk      Data saved with a previous flowsheet row definition   Functional Status Survey:    Vitals:   12/15/23 1506  BP: 114/69  Pulse: 77  Resp: 16  Temp: 98.5 F (36.9 C)  SpO2: 94%  Weight: 130 lb (59 kg)  Height: 5' 2 (1.575 m)   Body mass index is 23.78 kg/m. Physical Exam Constitutional:      General: She is not in acute distress.    Appearance: She is well-developed. She is not diaphoretic.  HENT:     Head: Normocephalic and atraumatic.      Mouth/Throat:     Pharynx: No oropharyngeal exudate.  Eyes:     Conjunctiva/sclera: Conjunctivae normal.     Pupils: Pupils are equal, round, and reactive to light.  Cardiovascular:     Rate and Rhythm: Normal rate and regular rhythm.     Heart sounds: Murmur heard.  Pulmonary:     Effort: Pulmonary effort is normal.     Breath sounds: Normal breath sounds.  Abdominal:     General: Bowel sounds are normal.     Palpations: Abdomen is soft.  Musculoskeletal:     Cervical back: Normal range of motion and neck supple.     Right lower leg: No edema.     Left lower leg: No edema.  Skin:    General: Skin is warm and dry.  Neurological:     Mental Status: She is alert. Mental status is at baseline.     Motor: Weakness present.  Gait: Gait abnormal.  Psychiatric:        Mood and Affect: Mood normal.     Labs reviewed: Recent Labs    01/19/23 0000  NA 141  K 4.0  CL 101  CO2 30*  BUN 23*  CREATININE 1.0   Recent Labs    01/19/23 0000  AST 15  ALT 15  ALKPHOS 114  ALBUMIN  3.7   Recent Labs    01/19/23 0000  WBC 10.2  NEUTROABS 6,691.00  HGB 12.1  HCT 36  PLT 322   Lab Results  Component Value Date   TSH 5.96 (A) 01/19/2023   Lab Results  Component Value Date   HGBA1C 5.6 06/12/2014   Lab Results  Component Value Date   CHOL 168 11/11/2017   HDL 53.10 11/11/2017   LDLCALC 100 (H) 11/11/2017   LDLDIRECT 175.9 02/05/2012   TRIG 73.0 11/11/2017   CHOLHDL 3 11/11/2017    Significant Diagnostic Results in last 30 days:  No results found.  Assessment/Plan Aortic valve stenosis Stable without shortness of breath or edema.   Major depressive disorder, recurrent, moderate Stable, Not currently on medication  Malignant neoplasm of left female breast (HCC) Continues on hospice services with comfort measures.   Moderate dementia without behavioral disturbance, psychotic disturbance, mood disturbance, or anxiety, unspecified dementia type  (HCC) Stable, continue supportive care.    Primary adenocarcinoma of lung (HCC) Comfort care only Followed by hospice.      Joselito Fieldhouse K. Caro BODILY [facility] [phone]

## 2024-01-06 ENCOUNTER — Other Ambulatory Visit: Payer: Self-pay | Admitting: Student

## 2024-01-06 DIAGNOSIS — Z515 Encounter for palliative care: Secondary | ICD-10-CM

## 2024-01-06 MED ORDER — MORPHINE SULFATE (CONCENTRATE) 20 MG/ML PO SOLN: ORAL | 0 refills | Status: DC

## 2024-01-06 NOTE — Progress Notes (Signed)
 Hospice medication management.

## 2024-01-13 ENCOUNTER — Other Ambulatory Visit: Payer: Self-pay | Admitting: Student

## 2024-01-13 DIAGNOSIS — Z515 Encounter for palliative care: Secondary | ICD-10-CM

## 2024-01-13 MED ORDER — MORPHINE SULFATE (CONCENTRATE) 20 MG/ML PO SOLN: ORAL | 0 refills | Status: DC

## 2024-01-13 NOTE — Progress Notes (Signed)
 Hospice medications added.

## 2024-02-02 ENCOUNTER — Other Ambulatory Visit: Payer: Self-pay | Admitting: Nurse Practitioner

## 2024-02-02 DIAGNOSIS — Z515 Encounter for palliative care: Secondary | ICD-10-CM

## 2024-02-02 MED ORDER — MORPHINE SULFATE (CONCENTRATE) 20 MG/ML PO SOLN: 5.0000 mg | Freq: Four times a day (QID) | ORAL | 0 refills | Status: DC

## 2024-02-03 ENCOUNTER — Encounter: Payer: Self-pay | Admitting: Student

## 2024-02-03 ENCOUNTER — Non-Acute Institutional Stay (SKILLED_NURSING_FACILITY): Payer: Self-pay | Admitting: Student

## 2024-02-03 DIAGNOSIS — Z515 Encounter for palliative care: Secondary | ICD-10-CM | POA: Diagnosis not present

## 2024-02-03 NOTE — Progress Notes (Signed)
 Location:  Other [facility].  Nursing Home Room Number: [facility] [id number] Place of Service:  SNF [phone]) Provider:  Abdul Fine, MD  Patient Care Team: Abdul Fine, MD as PCP - General (Family Medicine) Pyrtle, Gordy HERO, MD as Consulting Physician (Gastroenterology) Verdene Gills, RN as Registered Nurse Babara Call, MD as Consulting Physician (Hematology and Oncology) Delores Lauraine NOVAK, [facility] (Inactive) as Pharmacist (Pharmacist) Hester Alm BROCKS, MD (Dermatology) Nieves Cough, MD as Consulting Physician (Urology)  Extended Emergency Contact Information Primary Emergency Contact: Fleeta Audrea Coy Address: [address]          [place], [place] [address] [place] Home Phone: [phone] Mobile Phone: [phone] Relation: Spouse Secondary Emergency Contact: Rindfuss,Ellen Mobile Phone: [phone] Relation: Sister Preferred language: English  Code Status:  DNR Goals of care: Advanced Directive information    12/15/2023    3:11 PM  Advanced Directives  Does Patient Have a Medical Advance Directive? Yes  Type of Advance Directive Out of facility DNR (pink MOST or yellow form)  Does patient want to make changes to medical advance directive? No - Patient declined     Chief Complaint  Patient presents with   End of Life Care.     End of Life Care.     HPI:  Pt is a 83 y.o. female seen today for End of Life Care.  History of Present Illness The patient presents for evaluation of pain management while on hospice care.  She is currently receiving end-of-life care and is on hospice. The primary focus of the visit is to assess her pain management. No pain at the moment and no pain in her legs when she is not being touched. Occasional grimacing has been noted during care.  Her caregiver notes that she sometimes appears sleepy during the day, alternating between sleeping and watching television.   Past Medical History:  Diagnosis Date   AKI (acute  kidney injury) (HCC) 08/17/2018   Anxiety    Arthritis    Belching    Bladder disorder    OVERACTIVE   Bowel dysfunction    BLOCKAGE   Cancer (HCC)    breast   Constipation    Depression    Diverticulitis    Fibromyalgia    GERD (gastroesophageal reflux disease)    Hyperlipidemia    IBS (irritable bowel syndrome)    Internal hemorrhoids    Lung cancer (HCC) 2020   Memory deficits    Murmur    asymptomatic   Pneumonia 11/18/12   Urinary incontinence    Vertigo    Past Surgical History:  Procedure Laterality Date   AXILLARY LYMPH NODE BIOPSY Left 06/08/2018   INVASIVE MAMMARY CARCINOMA   BLADDER SUSPENSION  2004, 2012   BREAST BIOPSY Left 06/08/2018   INVASIVE MAMMARY CARCINOMA   CATARACT EXTRACTION W/PHACO Right 08/27/2015   Procedure: CATARACT EXTRACTION PHACO AND INTRAOCULAR LENS PLACEMENT (IOC);  Surgeon: Steven Dingeldein, MD;  Location: [facility] ORS;  Service: Ophthalmology;  Laterality: Right;  US    1:00.2 AP%  22.5 CDE  23.67 fluid casette lot #[id number]  exp05/31/2018   CATARACT EXTRACTION W/PHACO Left 10/15/2015   Procedure: CATARACT EXTRACTION PHACO AND INTRAOCULAR LENS PLACEMENT (IOC);  Surgeon: Steven Dingeldein, MD;  Location: [facility] ORS;  Service: Ophthalmology;  Laterality: Left;  US  01:07 AP% 18.1 CDE 21.57 fluid pack lot # [id number]   CHOLECYSTECTOMY     COLONOSCOPY  2017   ELECTROMAGNETIC NAVIGATION BROCHOSCOPY N/A 07/09/2018   Procedure: ELECTROMAGNETIC NAVIGATION BRONCHOSCOPY;  Surgeon:  Kasa, Kurian, MD;  Location: [facility] ORS;  Service: Cardiopulmonary;  Laterality: N/A;   TONSILLECTOMY  1947    Allergies  Allergen Reactions   Sulfa Antibiotics Rash    Outpatient Encounter Medications as of 02/03/2024  Medication Sig   acetaminophen  (TYLENOL ) 500 MG tablet Take 1,000 mg by mouth every 8 (eight) hours as needed.   acetaminophen  (TYLENOL ) 500 MG tablet Take 1,000 mg by mouth 3 (three) times daily.   bisacodyl (DULCOLAX) 10 MG suppository Place 10 mg rectally  as needed for moderate constipation.   ipratropium-albuterol  (DUONEB) 0.5-2.5 (3) MG/3ML SOLN Take 3 mLs by nebulization every 6 (six) hours as needed.   morphine  (ROXANOL) 20 MG/ML concentrated solution Take 0.25 mLs (5 mg total) by mouth every 6 (six) hours.   morphine  20 MG/5ML solution Take 0.25 mLs by mouth every hour as needed for pain.   nystatin  cream (MYCOSTATIN ) Apply 1 Application topically 2 (two) times daily as needed for dry skin.   polyethylene glycol (MIRALAX  / GLYCOLAX ) 17 g packet Take 17 g by mouth daily.   Zinc Oxide (TRIPLE PASTE) 12.8 % ointment Apply 1 Application topically as needed for irritation.   albuterol  (ACCUNEB ) 0.63 MG/3ML nebulizer solution Take 1 ampule by nebulization every 8 (eight) hours as needed for wheezing. (Patient not taking: Reported on 02/03/2024)   Infant Care Products [facility]) OINT Apply to buttocks,Coccyx, Sacrum topically as needed for redness. (Patient not taking: Reported on 02/03/2024)   No facility-administered encounter medications on file as of 02/03/2024.    Review of Systems  Immunization History  Administered Date(s) Administered   Fluad Quad(high Dose 65+) 02/01/2019   INFLUENZA, HIGH DOSE SEASONAL PF 03/23/2018   Influenza Split 02/05/2012   Influenza,inj,Quad PF,6+ Mos 02/15/2015, 02/24/2017   Influenza-Unspecified 02/23/2013, 03/11/2022, 03/18/2023   Moderna Sars-Covid-2 Vaccination 08/05/2019, 09/17/2019, 04/10/2020, 02/15/2021, 02/22/2023   Pneumococcal Conjugate-13 12/13/2013   Pneumococcal Polysaccharide-23 07/18/2015   Tdap 08/17/2019   Unspecified SARS-COV-2 Vaccination 02/15/2021   Pertinent  Health Maintenance Due  Topic Date Due   Influenza Vaccine  12/25/2023   DEXA SCAN  Completed   Colonoscopy  Discontinued      02/09/2021    8:15 PM 02/10/2021    9:00 AM 02/10/2021    8:30 PM 02/11/2021    7:00 AM 02/20/2021    3:00 PM  Fall Risk  (RETIRED) Patient Fall Risk Level High fall risk  High fall risk  High  fall risk  High fall risk  Low fall risk      Data saved with a previous flowsheet row definition   Functional Status Survey:    Vitals:   02/03/24 1011  BP: 106/72  Pulse: 70  Resp: 16  Temp: (!) 97.3 F (36.3 C)  SpO2: 96%  Weight: 122 lb (55.3 kg)  Height: 5' 2 (1.575 m)   Body mass index is 22.31 kg/m. Physical Exam Constitutional:      Comments: Getting fed breakfast.  Cardiovascular:     Rate and Rhythm: Normal rate.     Pulses: Normal pulses.  Neurological:     Mental Status: She is alert.     Labs reviewed: No results for input(s): NA, K, CL, CO2, GLUCOSE, BUN, CREATININE, CALCIUM , MG, PHOS in the last 8760 hours. No results for input(s): AST, ALT, ALKPHOS, BILITOT, PROT, ALBUMIN  in the last 8760 hours. No results for input(s): WBC, NEUTROABS, HGB, HCT, MCV, PLT in the last 8760 hours. Lab Results  Component Value Date   TSH 5.96 (A)  01/19/2023   Lab Results  Component Value Date   HGBA1C 5.6 06/12/2014   Lab Results  Component Value Date   CHOL 168 11/11/2017   HDL 53.10 11/11/2017   LDLCALC 100 (H) 11/11/2017   LDLDIRECT 175.9 02/05/2012   TRIG 73.0 11/11/2017   CHOLHDL 3 11/11/2017    Significant Diagnostic Results in last 30 days:  No results found.  Assessment/Plan Palliative care Currently on hospice care and appears comfortable without acute distress.  Pain management in palliative care Reports no pain when not touched, but occasional grimacing during care indicates some discomfort. Family/ staff Communication: nursing, hospice.   Labs/tests ordered:  none

## 2024-03-09 ENCOUNTER — Non-Acute Institutional Stay (SKILLED_NURSING_FACILITY): Payer: Self-pay | Admitting: Nurse Practitioner

## 2024-03-09 ENCOUNTER — Encounter: Payer: Self-pay | Admitting: Nurse Practitioner

## 2024-03-09 DIAGNOSIS — C50912 Malignant neoplasm of unspecified site of left female breast: Secondary | ICD-10-CM | POA: Diagnosis not present

## 2024-03-09 DIAGNOSIS — C3492 Malignant neoplasm of unspecified part of left bronchus or lung: Secondary | ICD-10-CM

## 2024-03-09 DIAGNOSIS — Z515 Encounter for palliative care: Secondary | ICD-10-CM

## 2024-03-09 DIAGNOSIS — F03B0 Unspecified dementia, moderate, without behavioral disturbance, psychotic disturbance, mood disturbance, and anxiety: Secondary | ICD-10-CM | POA: Diagnosis not present

## 2024-03-09 DIAGNOSIS — I350 Nonrheumatic aortic (valve) stenosis: Secondary | ICD-10-CM

## 2024-03-09 NOTE — Progress Notes (Signed)
 Location:  Other [facility].  Nursing Home Room Number: [facility] [id number] Place of Service:  SNF [phone]) Harlene An, NP  PCP: Abdul Fine, MD  Patient Care Team: Abdul Fine, MD as PCP - General (Family Medicine) Pyrtle, Gordy HERO, MD as Consulting Physician (Gastroenterology) Verdene Gills, RN as Registered Nurse Babara Call, MD as Consulting Physician (Hematology and Oncology) Delores Lauraine NOVAK, [facility] (Inactive) as Pharmacist (Pharmacist) Hester Alm BROCKS, MD (Dermatology) Nieves Cough, MD as Consulting Physician (Urology)  Extended Emergency Contact Information Primary Emergency Contact: Fleeta Audrea Coy Address: [address]          [place], [place] [address] [place] Home Phone: [phone] Mobile Phone: [phone] Relation: Spouse Secondary Emergency Contact: Rindfuss,Ellen Mobile Phone: [phone] Relation: Sister Preferred language: English  Goals of care: Advanced Directive information    03/09/2024    8:25 AM  Advanced Directives  Does Patient Have a Medical Advance Directive? Yes  Type of Advance Directive Out of facility DNR (pink MOST or yellow form)  Does patient want to make changes to medical advance directive? No - Patient declined     Chief Complaint  Patient presents with   Medical Management of Chronic Issues    Medical Management of Chronic Issues.     HPI:  Pt is a 83 y.o. female seen today for medical management of chronic disease. Pt with metastatic lung cancer on hospice services.  She has been stable over the last month and receiving comfort care.  She reports she is doing well.  She denies pain at this time No shortness of breath or chest pains.  No anxiety or depression reported She has STI to right heel and sacral ulcer that nursing is treated Not taking any oral medications She continues to lose weight.  Staff without acute concerns at this time.    Past Medical History:  Diagnosis Date   AKI  (acute kidney injury) 08/17/2018   Anxiety    Arthritis    Belching    Bladder disorder    OVERACTIVE   Bowel dysfunction    BLOCKAGE   Cancer (HCC)    breast   Constipation    Depression    Diverticulitis    Fibromyalgia    GERD (gastroesophageal reflux disease)    Hyperlipidemia    IBS (irritable bowel syndrome)    Internal hemorrhoids    Lung cancer (HCC) 2020   Memory deficits    Murmur    asymptomatic   Pneumonia 11/18/12   Urinary incontinence    Vertigo    Past Surgical History:  Procedure Laterality Date   AXILLARY LYMPH NODE BIOPSY Left 06/08/2018   INVASIVE MAMMARY CARCINOMA   BLADDER SUSPENSION  2004, 2012   BREAST BIOPSY Left 06/08/2018   INVASIVE MAMMARY CARCINOMA   CATARACT EXTRACTION W/PHACO Right 08/27/2015   Procedure: CATARACT EXTRACTION PHACO AND INTRAOCULAR LENS PLACEMENT (IOC);  Surgeon: Steven Dingeldein, MD;  Location: [facility] ORS;  Service: Ophthalmology;  Laterality: Right;  US    1:00.2 AP%  22.5 CDE  23.67 fluid casette lot #[id number] H  exp05/31/2018   CATARACT EXTRACTION W/PHACO Left 10/15/2015   Procedure: CATARACT EXTRACTION PHACO AND INTRAOCULAR LENS PLACEMENT (IOC);  Surgeon: Steven Dingeldein, MD;  Location: [facility] ORS;  Service: Ophthalmology;  Laterality: Left;  US  01:07 AP% 18.1 CDE 21.57 fluid pack lot # [id number]   CHOLECYSTECTOMY     COLONOSCOPY  2017   ELECTROMAGNETIC NAVIGATION BROCHOSCOPY N/A 07/09/2018   Procedure: ELECTROMAGNETIC NAVIGATION BRONCHOSCOPY;  Surgeon: Kasa, Kurian,  MD;  Location: [facility] ORS;  Service: Cardiopulmonary;  Laterality: N/A;   TONSILLECTOMY  1947    Allergies  Allergen Reactions   Sulfa Antibiotics Rash    Outpatient Encounter Medications as of 03/09/2024  Medication Sig   acetaminophen  (TYLENOL ) 500 MG tablet Take 1,000 mg by mouth every 8 (eight) hours as needed.   acetaminophen  (TYLENOL ) 650 MG suppository Place 650 mg rectally every 6 (six) hours as needed.   bisacodyl (DULCOLAX) 10 MG suppository Place  10 mg rectally as needed for moderate constipation.   ipratropium-albuterol  (DUONEB) 0.5-2.5 (3) MG/3ML SOLN Take 3 mLs by nebulization every 6 (six) hours as needed.   morphine  (ROXANOL) 20 MG/ML concentrated solution Take 0.25 mLs (5 mg total) by mouth every 6 (six) hours.   morphine  20 MG/5ML solution Take 0.25 mLs by mouth every hour as needed for pain.   nystatin  cream (MYCOSTATIN ) Apply 1 Application topically 2 (two) times daily as needed for dry skin.   polyethylene glycol (MIRALAX  / GLYCOLAX ) 17 g packet Take 17 g by mouth daily.   Zinc Oxide (TRIPLE PASTE) 12.8 % ointment Apply 1 Application topically as needed for irritation.   acetaminophen  (TYLENOL ) 500 MG tablet Take 1,000 mg by mouth 3 (three) times daily. (Patient not taking: Reported on 03/09/2024)   albuterol  (ACCUNEB ) 0.63 MG/3ML nebulizer solution Take 1 ampule by nebulization every 8 (eight) hours as needed for wheezing. (Patient not taking: Reported on 03/09/2024)   Infant Care Products [facility]) OINT Apply to buttocks,Coccyx, Sacrum topically as needed for redness. (Patient not taking: Reported on 03/09/2024)   No facility-administered encounter medications on file as of 03/09/2024.    Review of Systems  Unable to perform ROS: Dementia     Immunization History  Administered Date(s) Administered   Fluad Quad(high Dose 65+) 02/01/2019   INFLUENZA, HIGH DOSE SEASONAL PF 03/23/2018   Influenza Split 02/05/2012   Influenza,inj,Quad PF,6+ Mos 02/15/2015, 02/24/2017   Influenza-Unspecified 02/23/2013, 03/11/2022, 03/18/2023, 03/08/2024   Moderna Sars-Covid-2 Vaccination 08/05/2019, 09/17/2019, 04/10/2020, 02/15/2021, 02/22/2023   Pneumococcal Conjugate-13 12/13/2013   Pneumococcal Polysaccharide-23 07/18/2015   Tdap 08/17/2019   Unspecified SARS-COV-2 Vaccination 02/15/2021   Pertinent  Health Maintenance Due  Topic Date Due   Mammogram  05/06/2019   Influenza Vaccine  Completed   DEXA SCAN  Completed    Colonoscopy  Discontinued      02/09/2021    8:15 PM 02/10/2021    9:00 AM 02/10/2021    8:30 PM 02/11/2021    7:00 AM 02/20/2021    3:00 PM  Fall Risk  (RETIRED) Patient Fall Risk Level High fall risk  High fall risk  High fall risk  High fall risk  Low fall risk      Data saved with a previous flowsheet row definition   Functional Status Survey:    Vitals:   03/09/24 0818  BP: 123/74  Pulse: 78  Resp: 20  Temp: 99.1 F (37.3 C)  SpO2: 91%  Weight: 121 lb 9.6 oz (55.2 kg)  Height: 5' 2 (1.575 m)   Body mass index is 22.24 kg/m. Wt Readings from Last 3 Encounters:  03/09/24 121 lb 9.6 oz (55.2 kg)  02/03/24 122 lb (55.3 kg)  12/15/23 130 lb (59 kg)    Physical Exam Constitutional:      General: She is not in acute distress.    Appearance: She is well-developed. She is not diaphoretic.  HENT:     Head: Normocephalic and atraumatic.     Mouth/Throat:  Pharynx: No oropharyngeal exudate.  Eyes:     Conjunctiva/sclera: Conjunctivae normal.     Pupils: Pupils are equal, round, and reactive to light.  Cardiovascular:     Rate and Rhythm: Normal rate and regular rhythm.     Heart sounds: Normal heart sounds.  Pulmonary:     Effort: Pulmonary effort is normal.     Breath sounds: Normal breath sounds.  Abdominal:     General: Bowel sounds are normal.     Palpations: Abdomen is soft.  Musculoskeletal:     Cervical back: Normal range of motion and neck supple.     Right lower leg: No edema.     Left lower leg: No edema.  Skin:    General: Skin is warm and dry.  Neurological:     Mental Status: She is alert. Mental status is at baseline.     Motor: Weakness present.     Gait: Gait abnormal.  Psychiatric:        Mood and Affect: Mood normal.     Labs reviewed: No results for input(s): NA, K, CL, CO2, GLUCOSE, BUN, CREATININE, CALCIUM , MG, PHOS in the last 8760 hours. No results for input(s): AST, ALT, ALKPHOS, BILITOT, PROT,  ALBUMIN  in the last 8760 hours. No results for input(s): WBC, NEUTROABS, HGB, HCT, MCV, PLT in the last 8760 hours. Lab Results  Component Value Date   TSH 5.96 (A) 01/19/2023   Lab Results  Component Value Date   HGBA1C 5.6 06/12/2014   Lab Results  Component Value Date   CHOL 168 11/11/2017   HDL 53.10 11/11/2017   LDLCALC 100 (H) 11/11/2017   LDLDIRECT 175.9 02/05/2012   TRIG 73.0 11/11/2017   CHOLHDL 3 11/11/2017    Significant Diagnostic Results in last 30 days:  No results found.  Assessment/Plan  1. Malignant neoplasm of left female breast, unspecified estrogen receptor status, unspecified site of breast (HCC) (Primary) Continues under hospice care for comfort measures only  2. Primary adenocarcinoma of left lung (HCC) Continues with hospice for comfort measures  3. Moderate dementia without behavioral disturbance, psychotic disturbance, mood disturbance, or anxiety, unspecified dementia type (HCC) -Stable, no acute changes in cognitive or functional status, continue supportive care.  4. Aortic valve stenosis, etiology of cardiac valve disease unspecified Stable, euvolemic.   5. Hospice care patient Continues on full comfort measures and supportive care by staff   Harlene POUR. Caro BODILY [facility] [phone]

## 2024-03-14 NOTE — Assessment & Plan Note (Signed)
Stable, continue supportive care

## 2024-03-14 NOTE — Assessment & Plan Note (Signed)
 Stable without shortness of breath or edema.

## 2024-03-30 ENCOUNTER — Other Ambulatory Visit: Payer: Self-pay | Admitting: Orthopedic Surgery

## 2024-03-30 DIAGNOSIS — Z515 Encounter for palliative care: Secondary | ICD-10-CM

## 2024-03-30 MED ORDER — MORPHINE SULFATE (CONCENTRATE) 20 MG/ML PO SOLN
5.0000 mg | Freq: Four times a day (QID) | ORAL | 0 refills | Status: DC
Start: 2024-03-30 — End: 2024-04-07

## 2024-04-07 ENCOUNTER — Non-Acute Institutional Stay (SKILLED_NURSING_FACILITY): Payer: Self-pay | Admitting: Nurse Practitioner

## 2024-04-07 ENCOUNTER — Encounter: Payer: Self-pay | Admitting: Nurse Practitioner

## 2024-04-07 DIAGNOSIS — C50912 Malignant neoplasm of unspecified site of left female breast: Secondary | ICD-10-CM | POA: Diagnosis not present

## 2024-04-07 DIAGNOSIS — Z515 Encounter for palliative care: Secondary | ICD-10-CM

## 2024-04-07 DIAGNOSIS — F331 Major depressive disorder, recurrent, moderate: Secondary | ICD-10-CM

## 2024-04-07 DIAGNOSIS — I350 Nonrheumatic aortic (valve) stenosis: Secondary | ICD-10-CM

## 2024-04-07 DIAGNOSIS — F03B0 Unspecified dementia, moderate, without behavioral disturbance, psychotic disturbance, mood disturbance, and anxiety: Secondary | ICD-10-CM

## 2024-04-07 NOTE — Progress Notes (Signed)
 Location:  Other [facility].  Nursing Home Room Number: [id number] Place of Service:  SNF [phone]) Harlene An, NP  PCP: Laurence Locus, DO  Patient Care Team: Laurence Locus, DO as PCP - General (Internal Medicine) Pyrtle, Gordy HERO, MD as Consulting Physician (Gastroenterology) Verdene Gills, RN as Registered Nurse Babara Call, MD as Consulting Physician (Hematology and Oncology) Delores Lauraine NOVAK, [facility] (Inactive) as Pharmacist (Pharmacist) Hester Alm BROCKS, MD (Dermatology) Nieves Cough, MD as Consulting Physician (Urology) An Harlene POUR, NP as Nurse Practitioner (Geriatric Medicine)  Extended Emergency Contact Information Primary Emergency Contact: Fleeta Audrea Coy Address: [address]          [place], [place] [address] [place] Home Phone: [phone] Mobile Phone: [phone] Relation: Spouse Secondary Emergency Contact: Rindfuss,Ellen Mobile Phone: [phone] Relation: Sister Preferred language: English  Goals of care: Advanced Directive information    03/09/2024    8:25 AM  Advanced Directives  Does Patient Have a Medical Advance Directive? Yes  Type of Advance Directive Out of facility DNR (pink MOST or yellow form)  Does patient want to make changes to medical advance directive? No - Patient declined     Chief Complaint  Patient presents with   Medical Management of Chronic Issues    Medical Management of Chronic Issues.     HPI:  Pt is a 83 y.o. female seen today for medical management of chronic disease. She is on hospice care and comfort only.  There has been no new concerns in the last month.  She has had progressive weight loss- eating less.  Staff feeds her and provides total care.  She denies pain at time of visit.    Past Medical History:  Diagnosis Date   AKI (acute kidney injury) 08/17/2018   Anxiety    Arthritis    Belching    Bladder disorder    OVERACTIVE   Bowel dysfunction    BLOCKAGE   Cancer (HCC)     breast   Constipation    Depression    Diverticulitis    Fibromyalgia    GERD (gastroesophageal reflux disease)    Hyperlipidemia    IBS (irritable bowel syndrome)    Internal hemorrhoids    Lung cancer (HCC) 2020   Memory deficits    Murmur    asymptomatic   Pneumonia 11/18/12   Urinary incontinence    Vertigo    Past Surgical History:  Procedure Laterality Date   AXILLARY LYMPH NODE BIOPSY Left 06/08/2018   INVASIVE MAMMARY CARCINOMA   BLADDER SUSPENSION  2004, 2012   BREAST BIOPSY Left 06/08/2018   INVASIVE MAMMARY CARCINOMA   CATARACT EXTRACTION W/PHACO Right 08/27/2015   Procedure: CATARACT EXTRACTION PHACO AND INTRAOCULAR LENS PLACEMENT (IOC);  Surgeon: Steven Dingeldein, MD;  Location: [facility];  Service: Ophthalmology;  Laterality: Right;  US    1:00.2 AP%  22.5 CDE  23.67 fluid casette lot #[id number]  exp05/31/2018   CATARACT EXTRACTION W/PHACO Left 10/15/2015   Procedure: CATARACT EXTRACTION PHACO AND INTRAOCULAR LENS PLACEMENT (IOC);  Surgeon: Steven Dingeldein, MD;  Location: [facility];  Service: Ophthalmology;  Laterality: Left;  US  01:07 AP% 18.1 CDE 21.57 fluid pack lot # [id number]   CHOLECYSTECTOMY     COLONOSCOPY  2017   ELECTROMAGNETIC NAVIGATION BROCHOSCOPY N/A 07/09/2018   Procedure: ELECTROMAGNETIC NAVIGATION BRONCHOSCOPY;  Surgeon: Isaiah Scrivener, MD;  Location: [facility];  Service: Cardiopulmonary;  Laterality: N/A;   TONSILLECTOMY  1947    Allergies  Allergen Reactions  Sulfa Antibiotics Rash    Outpatient Encounter Medications as of 04/07/2024  Medication Sig   acetaminophen  (TYLENOL ) 500 MG tablet Take 1,000 mg by mouth every 8 (eight) hours as needed.   acetaminophen  (TYLENOL ) 650 MG suppository Place 650 mg rectally every 6 (six) hours as needed.   bisacodyl (DULCOLAX) 10 MG suppository Place 10 mg rectally as needed for moderate constipation.   ipratropium-albuterol  (DUONEB) 0.5-2.5 (3) MG/3ML SOLN Take 3 mLs by nebulization every 6 (six) hours  as needed.   morphine  20 MG/5ML solution Take 0.25 mLs by mouth every hour as needed for pain.   nystatin  cream (MYCOSTATIN ) Apply 1 Application topically 2 (two) times daily as needed for dry skin.   polyethylene glycol (MIRALAX  / GLYCOLAX ) 17 g packet Take 17 g by mouth daily.   Zinc Oxide (TRIPLE PASTE) 12.8 % ointment Apply 1 Application topically as needed for irritation.   [DISCONTINUED] morphine  (ROXANOL) 20 MG/ML concentrated solution Take 0.25 mLs (5 mg total) by mouth every 6 (six) hours. (Patient taking differently: Take 0.25 mLs by mouth every 6 (six) hours.)   [DISCONTINUED] acetaminophen  (TYLENOL ) 500 MG tablet Take 1,000 mg by mouth 3 (three) times daily. (Patient not taking: Reported on 04/07/2024)   [DISCONTINUED] albuterol  (ACCUNEB ) 0.63 MG/3ML nebulizer solution Take 1 ampule by nebulization every 8 (eight) hours as needed for wheezing. (Patient not taking: Reported on 04/07/2024)   [DISCONTINUED] Infant Care Products [facility]) OINT Apply to buttocks,Coccyx, Sacrum topically as needed for redness. (Patient not taking: Reported on 04/07/2024)   No facility-administered encounter medications on file as of 04/07/2024.    Review of Systems  Unable to perform ROS: Dementia     Immunization History  Administered Date(s) Administered   Fluad Quad(high Dose 65+) 02/01/2019   INFLUENZA, HIGH DOSE SEASONAL PF 03/23/2018   Influenza Split 02/05/2012   Influenza,inj,Quad PF,6+ Mos 02/15/2015, 02/24/2017   Influenza-Unspecified 02/23/2013, 03/11/2022, 03/18/2023, 03/08/2024   Moderna Sars-Covid-2 Vaccination 08/05/2019, 09/17/2019, 04/10/2020, 02/15/2021, 02/22/2023   Pneumococcal Conjugate-13 12/13/2013   Pneumococcal Polysaccharide-23 07/18/2015   Tdap 08/17/2019   Unspecified SARS-COV-2 Vaccination 02/15/2021   Pertinent  Health Maintenance Due  Topic Date Due   Mammogram  05/06/2019   Influenza Vaccine  Completed   DEXA SCAN  Completed   Colonoscopy  Discontinued       02/09/2021    8:15 PM 02/10/2021    9:00 AM 02/10/2021    8:30 PM 02/11/2021    7:00 AM 02/20/2021    3:00 PM  Fall Risk  (RETIRED) Patient Fall Risk Level High fall risk  High fall risk  High fall risk  High fall risk  Low fall risk      Data saved with a previous flowsheet row definition   Functional Status Survey:    Vitals:   04/07/24 0915  BP: 110/70  Pulse: 79  Resp: 16  Temp: (!) 97.1 F (36.2 C)  SpO2: 94%  Weight: 112 lb 6.4 oz (51 kg)  Height: 5' 2 (1.575 m)   Body mass index is 20.56 kg/m. Physical Exam Constitutional:      General: She is not in acute distress.    Appearance: She is well-developed. She is not diaphoretic.  HENT:     Head: Normocephalic and atraumatic.     Mouth/Throat:     Pharynx: No oropharyngeal exudate.  Eyes:     Conjunctiva/sclera: Conjunctivae normal.     Pupils: Pupils are equal, round, and reactive to light.  Cardiovascular:     Rate  and Rhythm: Normal rate and regular rhythm.     Heart sounds: Normal heart sounds.  Pulmonary:     Effort: Pulmonary effort is normal.     Breath sounds: Normal breath sounds.  Abdominal:     General: Bowel sounds are normal.     Palpations: Abdomen is soft.  Musculoskeletal:     Cervical back: Normal range of motion and neck supple.     Right lower leg: No edema.     Left lower leg: No edema.  Skin:    General: Skin is warm and dry.  Neurological:     Mental Status: She is alert.     Motor: Weakness present.     Gait: Gait abnormal.  Psychiatric:        Mood and Affect: Mood normal.     Labs reviewed: No results for input(s): NA, K, CL, CO2, GLUCOSE, BUN, CREATININE, CALCIUM , MG, PHOS in the last 8760 hours. No results for input(s): AST, ALT, ALKPHOS, BILITOT, PROT, ALBUMIN  in the last 8760 hours. No results for input(s): WBC, NEUTROABS, HGB, HCT, MCV, PLT in the last 8760 hours. Lab Results  Component Value Date   TSH 5.96 (A)  01/19/2023   Lab Results  Component Value Date   HGBA1C 5.6 06/12/2014   Lab Results  Component Value Date   CHOL 168 11/11/2017   HDL 53.10 11/11/2017   LDLCALC 100 (H) 11/11/2017   LDLDIRECT 175.9 02/05/2012   TRIG 73.0 11/11/2017   CHOLHDL 3 11/11/2017    Significant Diagnostic Results in last 30 days:  No results found.  Assessment/Plan Moderate dementia without behavioral disturbance, psychotic disturbance, mood disturbance, or anxiety, unspecified dementia type (HCC) Stable, continue supportive care.    Malignant neoplasm of left female breast (HCC) Continues on hospice services with comfort measures.   Major depressive disorder, recurrent, moderate Stable, Not currently on medication  Hospice care patient Comfort measures only   Aortic valve stenosis Stable without shortness of breath or edema.      Eleanora Guinyard K. Caro BODILY [facility] [phone]

## 2024-04-07 NOTE — Assessment & Plan Note (Signed)
 Continues on hospice services with comfort measures.

## 2024-04-07 NOTE — Assessment & Plan Note (Signed)
 Stable, Not currently on medication

## 2024-04-07 NOTE — Assessment & Plan Note (Addendum)
 Comfort measures only

## 2024-04-07 NOTE — Assessment & Plan Note (Signed)
Stable, continue supportive care

## 2024-04-07 NOTE — Assessment & Plan Note (Signed)
 Stable without shortness of breath or edema.

## 2024-04-25 DEATH — deceased
